# Patient Record
Sex: Male | Born: 1950 | Race: White | Hispanic: No | Marital: Married | Smoking: Never smoker
Health system: Southern US, Community
[De-identification: ages and names within clinical notes are randomized; demographics above are authoritative.]

## PROBLEM LIST (undated history)

## (undated) DIAGNOSIS — M545 Low back pain, unspecified: Secondary | ICD-10-CM

## (undated) DIAGNOSIS — M4126 Other idiopathic scoliosis, lumbar region: Secondary | ICD-10-CM

## (undated) DIAGNOSIS — R9431 Abnormal electrocardiogram [ECG] [EKG]: Secondary | ICD-10-CM

## (undated) DIAGNOSIS — M5416 Radiculopathy, lumbar region: Secondary | ICD-10-CM

## (undated) DIAGNOSIS — M2560 Stiffness of unspecified joint, not elsewhere classified: Secondary | ICD-10-CM

## (undated) DIAGNOSIS — M4316 Spondylolisthesis, lumbar region: Secondary | ICD-10-CM

## (undated) DIAGNOSIS — M7989 Other specified soft tissue disorders: Secondary | ICD-10-CM

## (undated) DIAGNOSIS — Z13828 Encounter for screening for other musculoskeletal disorder: Secondary | ICD-10-CM

## (undated) DIAGNOSIS — M25569 Pain in unspecified knee: Secondary | ICD-10-CM

## (undated) DIAGNOSIS — Z9289 Personal history of other medical treatment: Secondary | ICD-10-CM

## (undated) DIAGNOSIS — M1990 Unspecified osteoarthritis, unspecified site: Secondary | ICD-10-CM

## (undated) DIAGNOSIS — G4730 Sleep apnea, unspecified: Secondary | ICD-10-CM

## (undated) DIAGNOSIS — M48061 Spinal stenosis, lumbar region without neurogenic claudication: Secondary | ICD-10-CM

## (undated) HISTORY — DX: Spinal stenosis, lumbar region without neurogenic claudication: M48.061

## (undated) HISTORY — PX: BACK SURGERY: SHX140

## (undated) HISTORY — PX: COLONOSCOPY W/ POLYPECTOMY: SHX1380

## (undated) HISTORY — DX: Radiculopathy, lumbar region: M54.16

## (undated) HISTORY — DX: Other specified soft tissue disorders: M79.89

## (undated) HISTORY — DX: Stiffness of unspecified joint, not elsewhere classified: M25.60

## (undated) HISTORY — PX: HERNIA REPAIR: SHX51

## (undated) HISTORY — DX: Pain in unspecified knee: M25.569

## (undated) HISTORY — DX: Encounter for screening for other musculoskeletal disorder: Z13.828

## (undated) HISTORY — DX: Low back pain, unspecified: M54.50

## (undated) HISTORY — PX: KNEE ARTHROSCOPY: SHX127

## (undated) HISTORY — DX: Other idiopathic scoliosis, lumbar region: M41.26

## (undated) HISTORY — DX: Spondylolisthesis, lumbar region: M43.16

## (undated) NOTE — *Deleted (*Deleted)
Physical Medicine and Rehabilitation Admission H&P     HPI: Marco Kennedy is a 24 year old right-handed male with history of OSA on BiPAP, left THA 2017.  Per chart review patient lives with spouse.  Two-level home bed and bath main level.  Independent with assistive device.  Presented 04/18/2020 with progressive low back pain radiating to the lower extremities.  X-rays and imaging revealed scoliosis and kyphoscoliosis with radiculopathy L4-5, L5 to S1.  Patient no change with conservative care recommendations of two-stage lateral lumbar fusion.  Patient underwent L4-5 L5 to S1 anterior lumbar interbody fusion with anterior abdominal approach 04/18/2020 per Dr. Venetia Maxon assisted by vascular surgery Dr. Chestine Spore followed by T10 to pelvis pedicle screw fixation with osteotomies 04/21/2020.  Back brace as directed.  Therapy evaluations completed and patient was admitted for a comprehensive rehab program.  Review of Systems  Constitutional: Negative for chills and fever.  HENT: Negative for hearing loss.   Eyes: Negative for blurred vision, double vision and discharge.  Respiratory: Negative for cough and shortness of breath.   Cardiovascular: Negative for chest pain and leg swelling.  Gastrointestinal: Positive for constipation. Negative for heartburn, nausea and vomiting.  Genitourinary: Positive for urgency. Negative for dysuria, flank pain and hematuria.  Musculoskeletal: Positive for back pain, joint pain and myalgias.  Skin: Negative for rash.  All other systems reviewed and are negative.  Past Medical History:  Diagnosis Date  . Arthritis    arthritis-bilateral knees, left Hip.  . Degenerative lumbar spinal stenosis   . EKG abnormality    06-08-16- being evaluated by cardiologist  in [place]  . Idiopathic scoliosis of lumbar region   . Joint stiffness   . Knee pain   . Leg swelling   . Low back pain    Achy, shooting pain.  Hurts to Walk or stand up straight.  . Lumbar  radiculopathy   . Scoliosis concern   . Sleep apnea    Bipap use nightly 25/19 -3l/m oxygen  . Spondylolisthesis of lumbar region   . Transfusion history    [place] ,[place] after bleeding post colon polyp removal and colonscopy procedure   Past Surgical History:  Procedure Laterality Date  . ABDOMINAL EXPOSURE N/A 04/18/2020   Procedure: ABDOMINAL EXPOSURE;  Surgeon: [name], MD;  Location: [facility] OR;  Service: Vascular;  Laterality: N/A;  . ANTERIOR LAT LUMBAR FUSION Left 04/18/2020   Procedure: ANTERIOR LATERAL LUMBAR FUSION LUMBAR TWO-THREE, LUMBAR THREE-FOUR;  Surgeon: [name], MD;  Location: [facility] OR;  Service: Neurosurgery;  Laterality: Left;  . ANTERIOR LUMBAR FUSION N/A 04/18/2020   Procedure: Lumbar Four-Five Lumbar Five- Sacral One Anterior lumbar Interbody Fusion;  Surgeon: [name], MD;  Location: [facility] OR;  Service: Neurosurgery;  Laterality: N/A;  . APPLICATION OF INTRAOPERATIVE CT SCAN N/A 04/21/2020   Procedure: APPLICATION OF INTRAOPERATIVE CT SCAN;  Surgeon: [name], MD;  Location: [facility] OR;  Service: Neurosurgery;  Laterality: N/A;  . BACK SURGERY     lumbar disc surgery  . COLONOSCOPY W/ POLYPECTOMY    . HERNIA REPAIR Left    left inguinal hernia repair  . KNEE ARTHROSCOPY Right 2003    -scope for meniscus  . POSTERIOR LUMBAR FUSION 4 LEVEL N/A 04/21/2020   Procedure: Thoracic ten to pelvis pedicle screw fixation with osteotomies;  Surgeon: [name], MD;  Location: [facility] OR;  Service: Neurosurgery;  Laterality: N/A;  . TOTAL HIP ARTHROPLASTY Left 06/15/2016   Procedure: LEFT TOTAL HIP ARTHROPLASTY ANTERIOR APPROACH;  Surgeon: [name], MD;  Location: [facility] ORS;  Service: Orthopedics;  Laterality: Left;   Family History  Problem Relation Age of Onset  . Hypertension Mother   . Arthritis Mother   . Stomach cancer Father   . Hypertension Brother    Social History:  reports that he has never smoked. He has never used smokeless tobacco. He reports  current alcohol use. He reports that he does not use drugs. Allergies: No Known Allergies Medications Prior to Admission  Medication Sig Dispense Refill  . acetaminophen (TYLENOL) 500 MG tablet Take 1,000 mg by mouth every 6 (six) hours as needed for moderate pain.     . Ascorbic Acid (VITAMIN C) 100 MG tablet Take 100 mg by mouth daily.    . cholecalciferol (VITAMIN D3) 25 MCG (1000 UNIT) tablet Take 1,000 Units by mouth daily.    . diclofenac (VOLTAREN) 75 MG EC tablet Take 75 mg by mouth 2 (two) times daily as needed.    . Multiple Vitamin (MULTIVITAMIN WITH MINERALS) TABS tablet Take 1 tablet by mouth daily.    Marland Kitchen docusate sodium (COLACE) 100 MG capsule Take 1 capsule (100 mg total) by mouth 2 (two) times daily. (Patient not taking: Reported on 04/03/2020) 10 capsule 0  . ferrous sulfate 325 (65 FE) MG tablet Take 1 tablet (325 mg total) by mouth 3 (three) times daily after meals. (Patient not taking: Reported on 04/03/2020)  3  . Glucosamine-Chondroitin (OSTEO BI-FLEX REGULAR STRENGTH PO) Take 2 capsules by mouth daily. (Patient not taking: Reported on 12/25/2019)    . HYDROcodone-acetaminophen (NORCO) 7.5-325 MG tablet Take 1-2 tablets by mouth every 4 (four) hours as needed for moderate pain. (Patient not taking: Reported on 12/25/2019) 60 tablet 0  . methocarbamol (ROBAXIN) 500 MG tablet Take 1 tablet (500 mg total) by mouth every 6 (six) hours as needed for muscle spasms. (Patient not taking: Reported on 12/25/2019) 40 tablet 0  . polyethylene glycol (MIRALAX / GLYCOLAX) packet Take 17 g by mouth 2 (two) times daily. (Patient not taking: Reported on 12/25/2019) 14 each 0    Drug Regimen Review Drug regimen was reviewed and remains appropriate with no significant issues identified  Home: Home Living Family/patient expects to be discharged to:: Private residence Living Arrangements: Spouse/significant other Available Help at Discharge: Family, Available PRN/intermittently (daughter who is PT  for 4 days following d/c) Type of Home: House Home Access: Stairs to enter Home Layout: Two level, Able to live on main level with bedroom/bathroom Alternate Level Stairs-Number of Steps: flight Bathroom Shower/Tub: Engineer, manufacturing systems: Standard Home Equipment: Cane - single point, Environmental consultant - 2 wheels, Bedside commode Additional Comments: recliner pt could sleep in if needed   Functional History: Prior Function Level of Independence: Independent with assistive device(s) Comments: pt reports use of RW or cane as needed, limited by pain   Functional Status:  Mobility: Bed Mobility Overal bed mobility: Needs Assistance Bed Mobility: Rolling, Sidelying to Sit Rolling: Mod assist Sidelying to sit: Mod assist, Max assist, +2 for safety/equipment Sit to sidelying: Mod assist General bed mobility comments: cued for technique, truncal assist for roll right and significant truncal assist up from R elbow Transfers Overall transfer level: Needs assistance Equipment used: Rolling walker (2 wheeled) Transfers: Sit to/from Stand, Stand Pivot Transfers Sit to Stand: Mod assist, +2 physical assistance, Max assist Stand pivot transfers: Mod assist, +2 physical assistance General transfer comment: cues for hand placement, assist forward and boost.  Ambulation/Gait Ambulation/Gait assistance: Min assist, +  2 safety/equipment Gait Distance (Feet): 18 Feet Assistive device: Rolling walker (2 wheeled) Gait Pattern/deviations: Step-to pattern, Step-through pattern, Decreased step length - right, Decreased step length - left, Decreased stance time - left, Decreased stride length General Gait Details: cues for posture correction, short low amplitude steps with stability assist Gait velocity: decreased Gait velocity interpretation: <1.31 ft/sec, indicative of household ambulator    ADL: ADL Overall ADL's : Needs assistance/impaired Eating/Feeding: Modified independent, Sitting Grooming:  Wash/dry face, Set up, Sitting Grooming Details (indicate cue type and reason): supported sitting  Upper Body Bathing: Min guard, Sitting Lower Body Bathing: Maximal assistance, +2 for physical assistance, Sit to/from stand Upper Body Dressing : Moderate assistance, Sitting Upper Body Dressing Details (indicate cue type and reason): for TLSO management Lower Body Dressing: Total assistance, +2 for physical assistance, Sit to/from stand Lower Body Dressing Details (indicate cue type and reason): socks Toilet Transfer: Moderate assistance, +2 for safety/equipment, +2 for physical assistance, Stand-pivot Toilet Transfer Details (indicate cue type and reason): simulated via transfer to recliner  Toileting- Clothing Manipulation and Hygiene: Maximal assistance, +2 for physical assistance, +2 for safety/equipment, Sit to/from stand Functional mobility during ADLs: Moderate assistance, +2 for physical assistance, +2 for safety/equipment, Rolling walker General ADL Comments: pt with limitations due to pain, LLE weakness   Cognition: Cognition Overall Cognitive Status: Within Functional Limits for tasks assessed Orientation Level: Oriented X4 Cognition Arousal/Alertness: Awake/alert Behavior During Therapy: WFL for tasks assessed/performed Overall Cognitive Status: Within Functional Limits for tasks assessed  Physical Exam: Blood pressure (!) 143/75, pulse 67, temperature 98.6 F (37 C), temperature source Axillary, resp. rate 18, height 5\' 7"  (1.702 m), weight 108 kg, SpO2 98 %. Physical Exam Skin:    Comments: Surgical sites are dressed.  Appropriately tender  Neurological:     Comments: Patient is alert in no acute distress.  Oriented x3 and follows commands.     No results found for this or any previous visit (from the past 48 hour(s)). No results found.     Medical Problem List and Plan: 1.  Decreased functional mobility secondary to scoliosis and kyphoscoliosis with lumbar  status post 2 part L4-5 L5-S1 lumbar interbody fusion 04/18/2024 with anterior approach followed by thoracic T10 to pelvis pedicle screw fixation 04/21/2020.  Back brace when out of bed stenosis/radiculopathy.  -patient may *** shower  -ELOS/Goals: *** 2.  Antithrombotics: -DVT/anticoagulation: SCDs.  Check vascular study  -antiplatelet therapy: N/A 3. Pain Management: Robaxin and hydrocodone as needed 4. Mood: Provide emotional support  -antipsychotic agents: N/A 5. Neuropsych: This patient is capable of making decisions on his own behalf. 6. Skin/Wound Care: Routine skin checks 7. Fluids/Electrolytes/Nutrition: Routine in and outs with follow-up chemistries 8.  Constipation.  Colace 100 mg twice daily.  MiraLAX daily as needed, Dulcolax suppository daily as needed    ***  Charlton Amor, PA-C 04/24/2020

## (undated) NOTE — *Deleted (*Deleted)
   04/20/20 1020  Pain Assessment  Pain Scale 0-10

---

## 2007-11-13 ENCOUNTER — Encounter (INDEPENDENT_AMBULATORY_CARE_PROVIDER_SITE_OTHER): Payer: Self-pay | Admitting: Specialist

## 2007-11-13 ENCOUNTER — Ambulatory Visit (HOSPITAL_COMMUNITY): Admission: RE | Admit: 2007-11-13 | Discharge: 2007-11-14 | Payer: Self-pay | Admitting: Specialist

## 2007-11-13 IMAGING — CR DG LUMBAR SPINE 2-3V
2 series · 2 of 2 positions shown · non-contrast
Comparison: None is filled

CLINICAL DATA: Preop workup for laminectomy at L4-5.  HNP at L4-5

LUMBAR SPINE - 2-3 VIEW

[t l-spine a.p.]
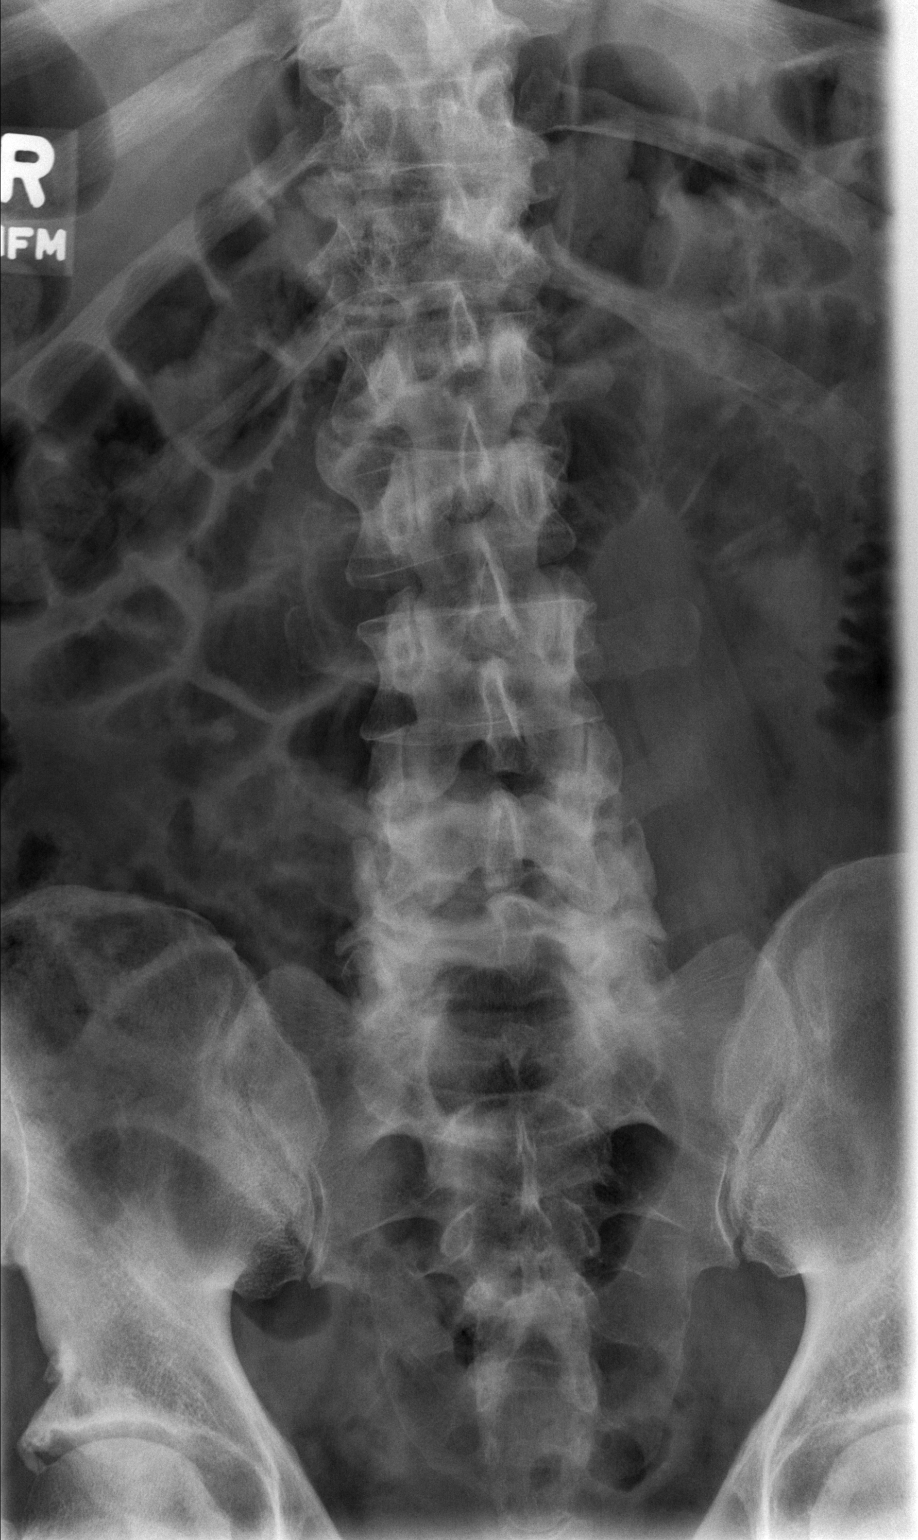

[t l-spine lat]
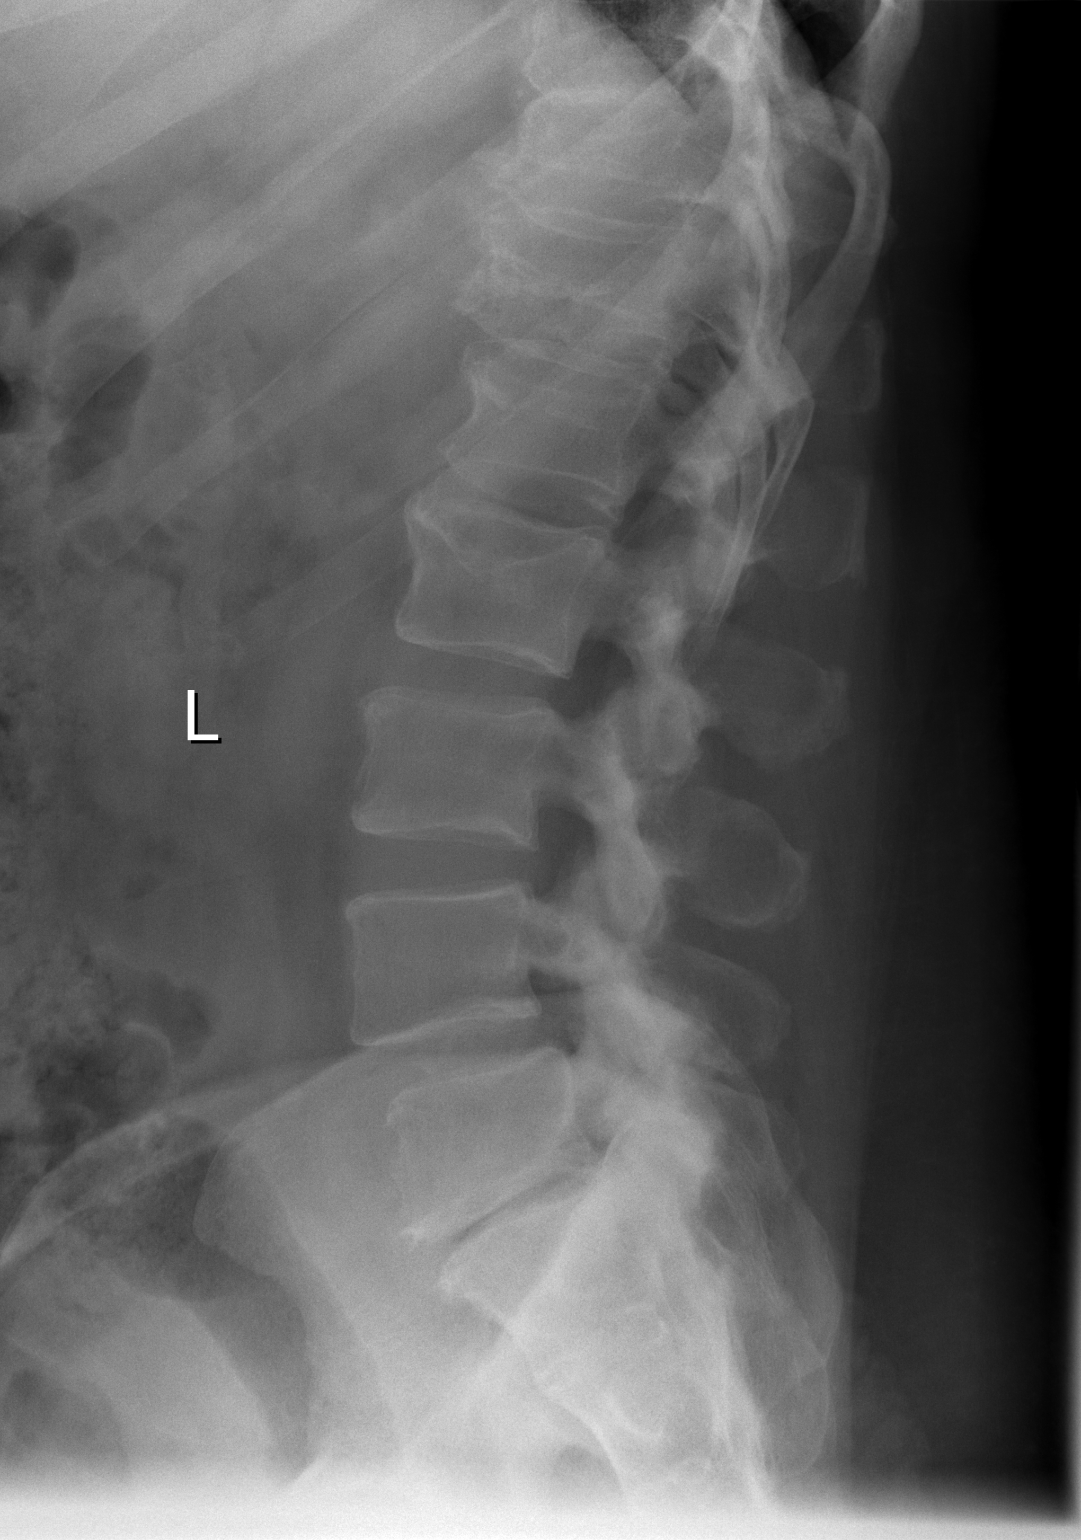

[2 of 2 positions shown; findings below may reference images not displayed]

FINDINGS: The patient has five non-rib-bearing lumbar vertebrae.
Lowest interspace is L5-S1.  Minimal anterolisthesis of L4-L5 by 3
mm secondary to degenerative facet arthropathy.  Marked disc space
narrowing and vacuum disc at L5-S1.  Findings compatible with
previous superior L2 endplate compression.  There may be endplate
compressions at T11, T12 and L1 as well.  Degenerative changes at
T11-12, T12-L1 and L1-2.  SI joints unremarkable.  Mild gaseous
distension of a few small bowel loops may represent an ileus.
IMPRESSION: The advanced DDD L5-S1.  Minimal closed arch anterolisthesis of L4
on L5..  Degenerative changes also are noted involving the lower
thoracic and upper lumbar spine.  Probable remote endplate
compressions of T11 to L2.

## 2007-11-13 IMAGING — CR DG OR PORTABLE SPINE
1 series · 1 of 1 positions shown · non-contrast
Comparison: [DATE]

CLINICAL DATA: L4-5 herniated disc

PORTABLE SPINE

[view not recorded]
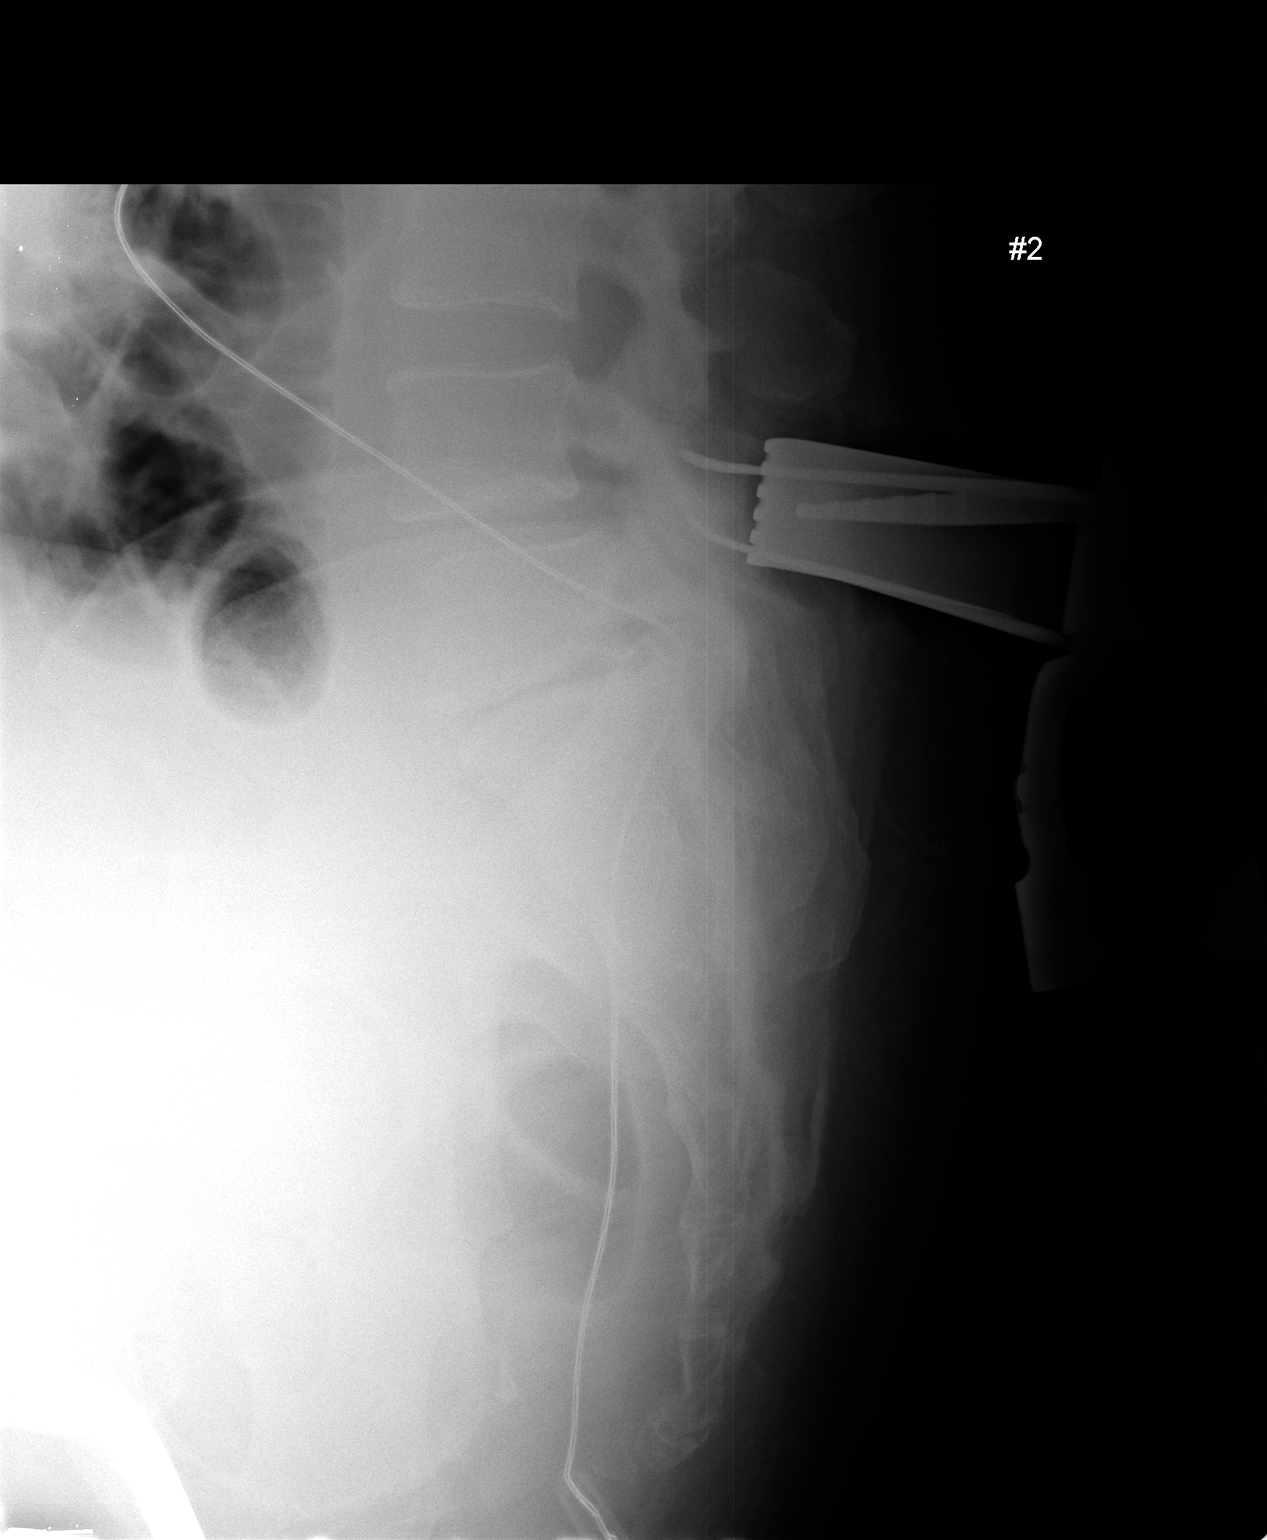

[1 of 1 positions shown; findings below may reference images not displayed]

FINDINGS: Intraoperative lateral lumbar spine view at [6P] hours
demonstrates radiopaque localizers and a surgical retractor
posterior to L4-5.
IMPRESSION: Intraoperative localization L4-5

## 2007-11-13 IMAGING — CR DG CHEST 2V
2 series · 2 of 2 positions shown · non-contrast
Comparison: None

CLINICAL DATA: Preop workup for lumbar surgery.

CHEST - 2 VIEW

[w chest pa *]
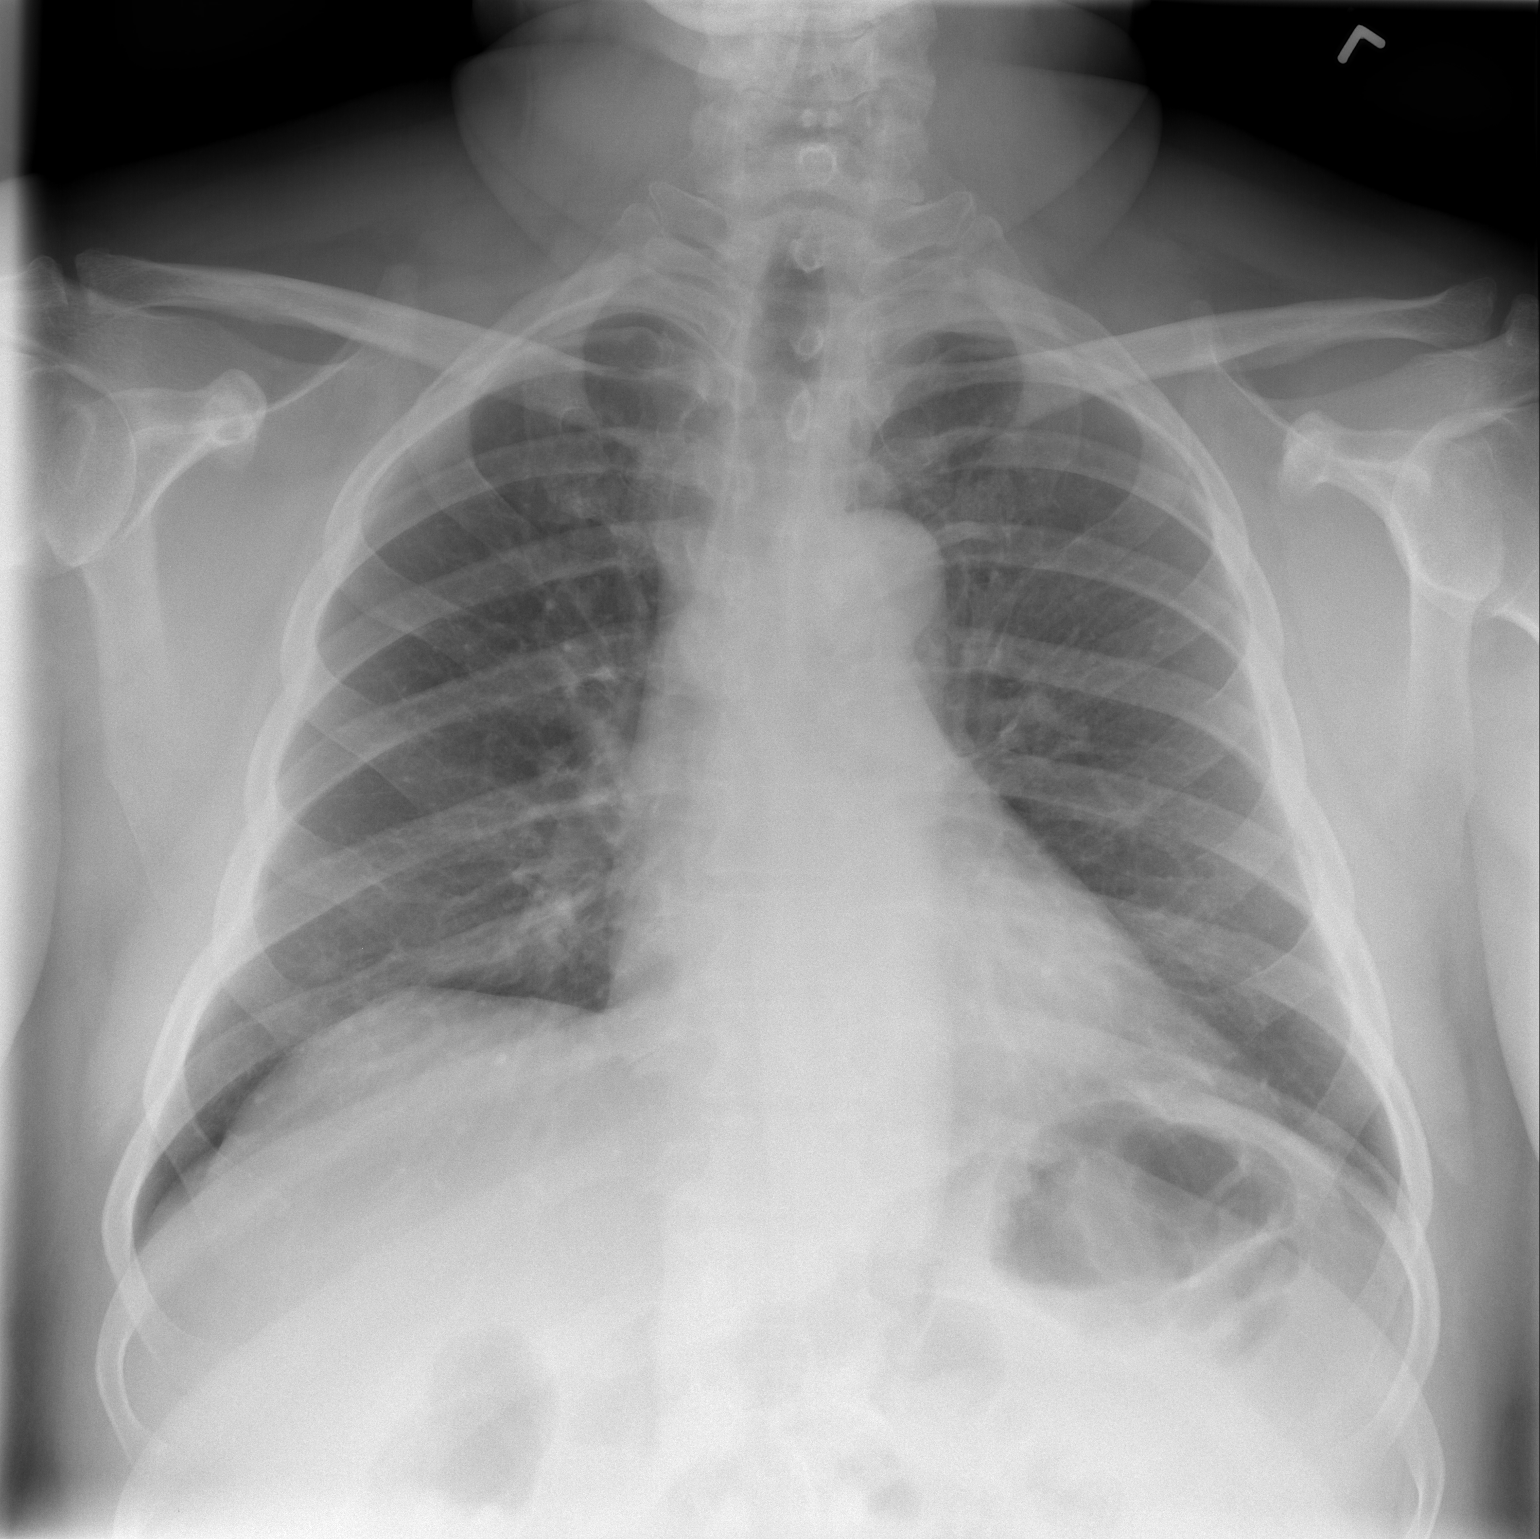

[w chest lat *]
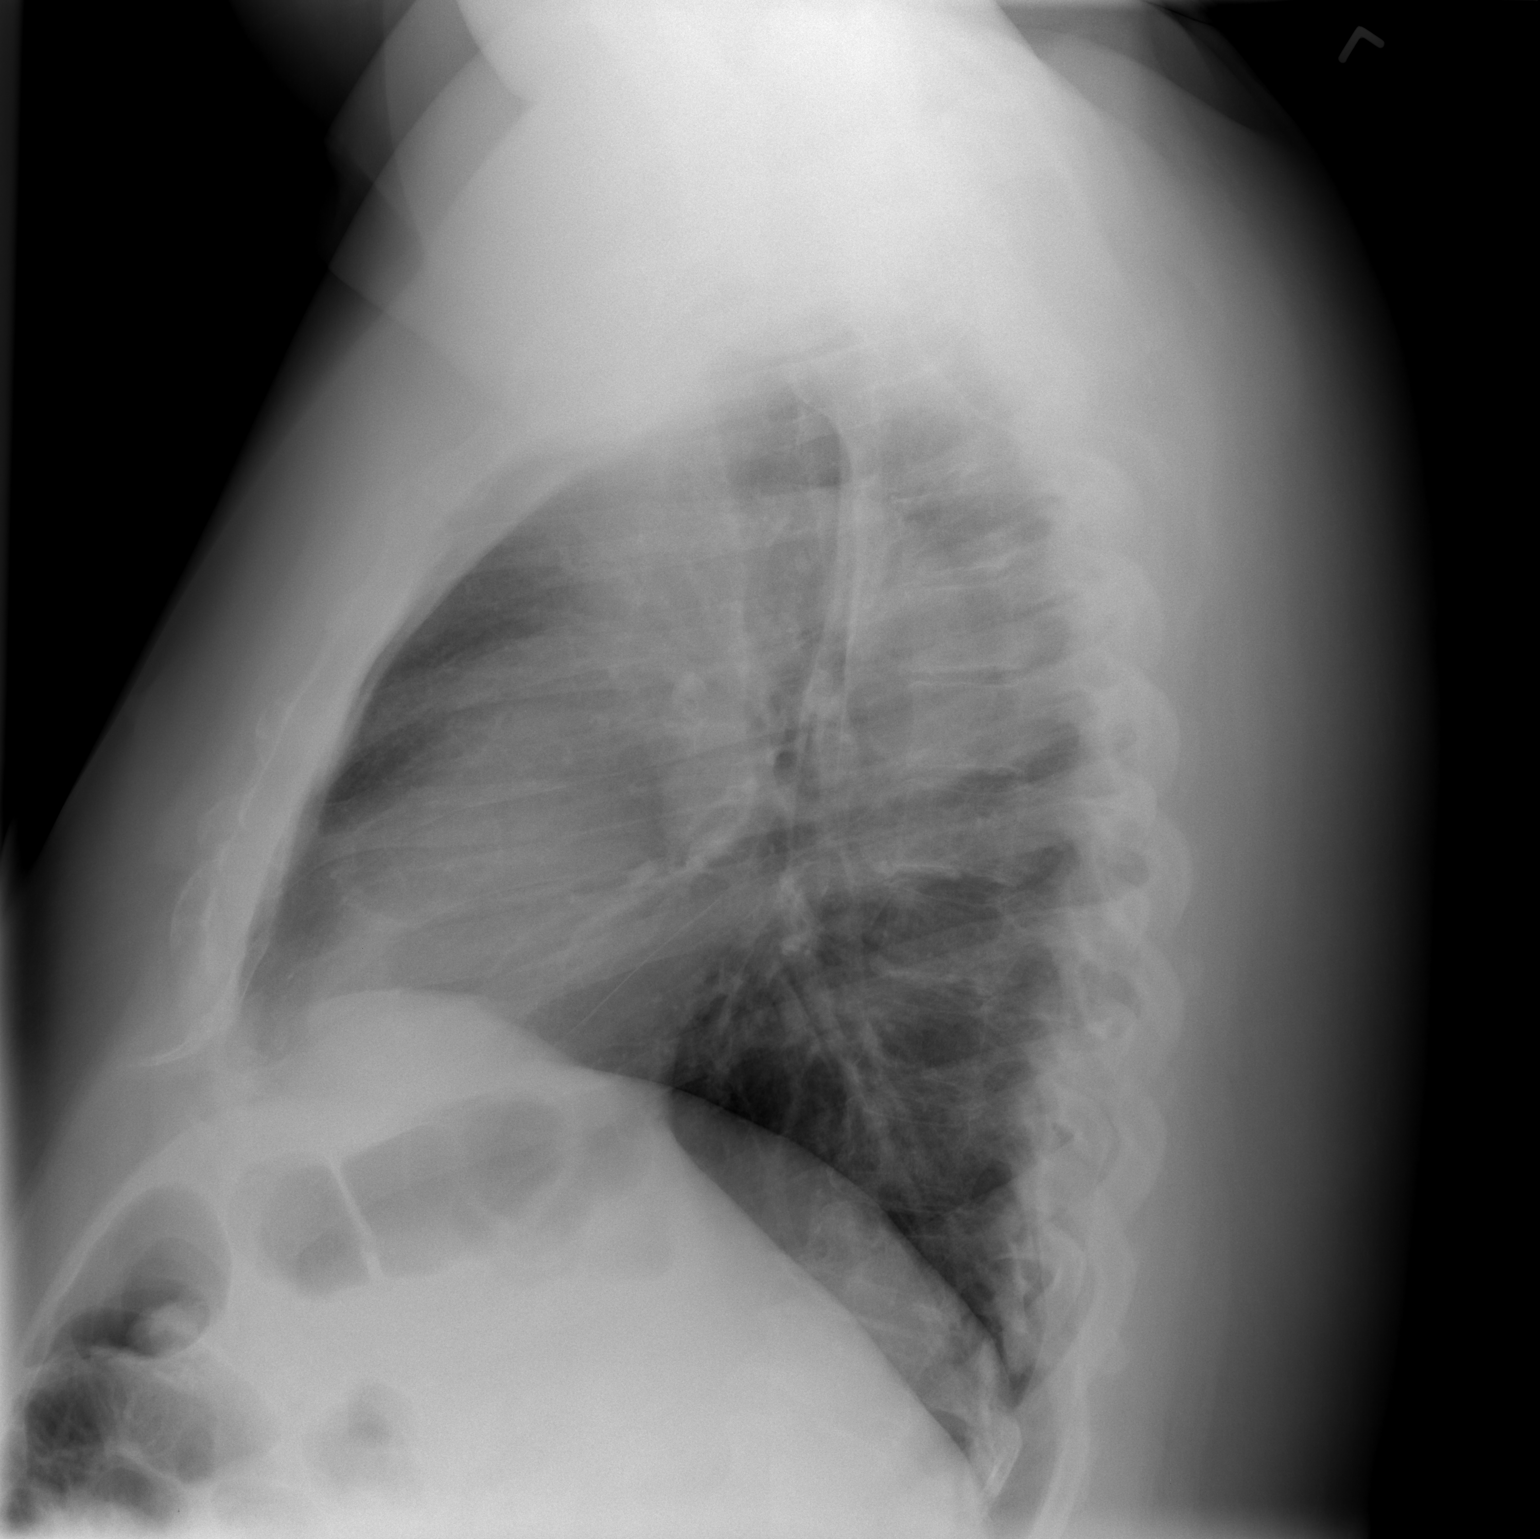

[2 of 2 positions shown; findings below may reference images not displayed]

FINDINGS: The heart size and mediastinal contours are within normal
limits.  Both lungs are clear.  The visualized skeletal structures
are unremarkable.
IMPRESSION: No active cardiopulmonary disease.

## 2007-11-13 IMAGING — CR DG OR PORTABLE SPINE
1 series · 1 of 1 positions shown · non-contrast
Comparison: [DATE] lumbar spine

CLINICAL DATA: Back pain

PORTABLE SPINE

[view not recorded]
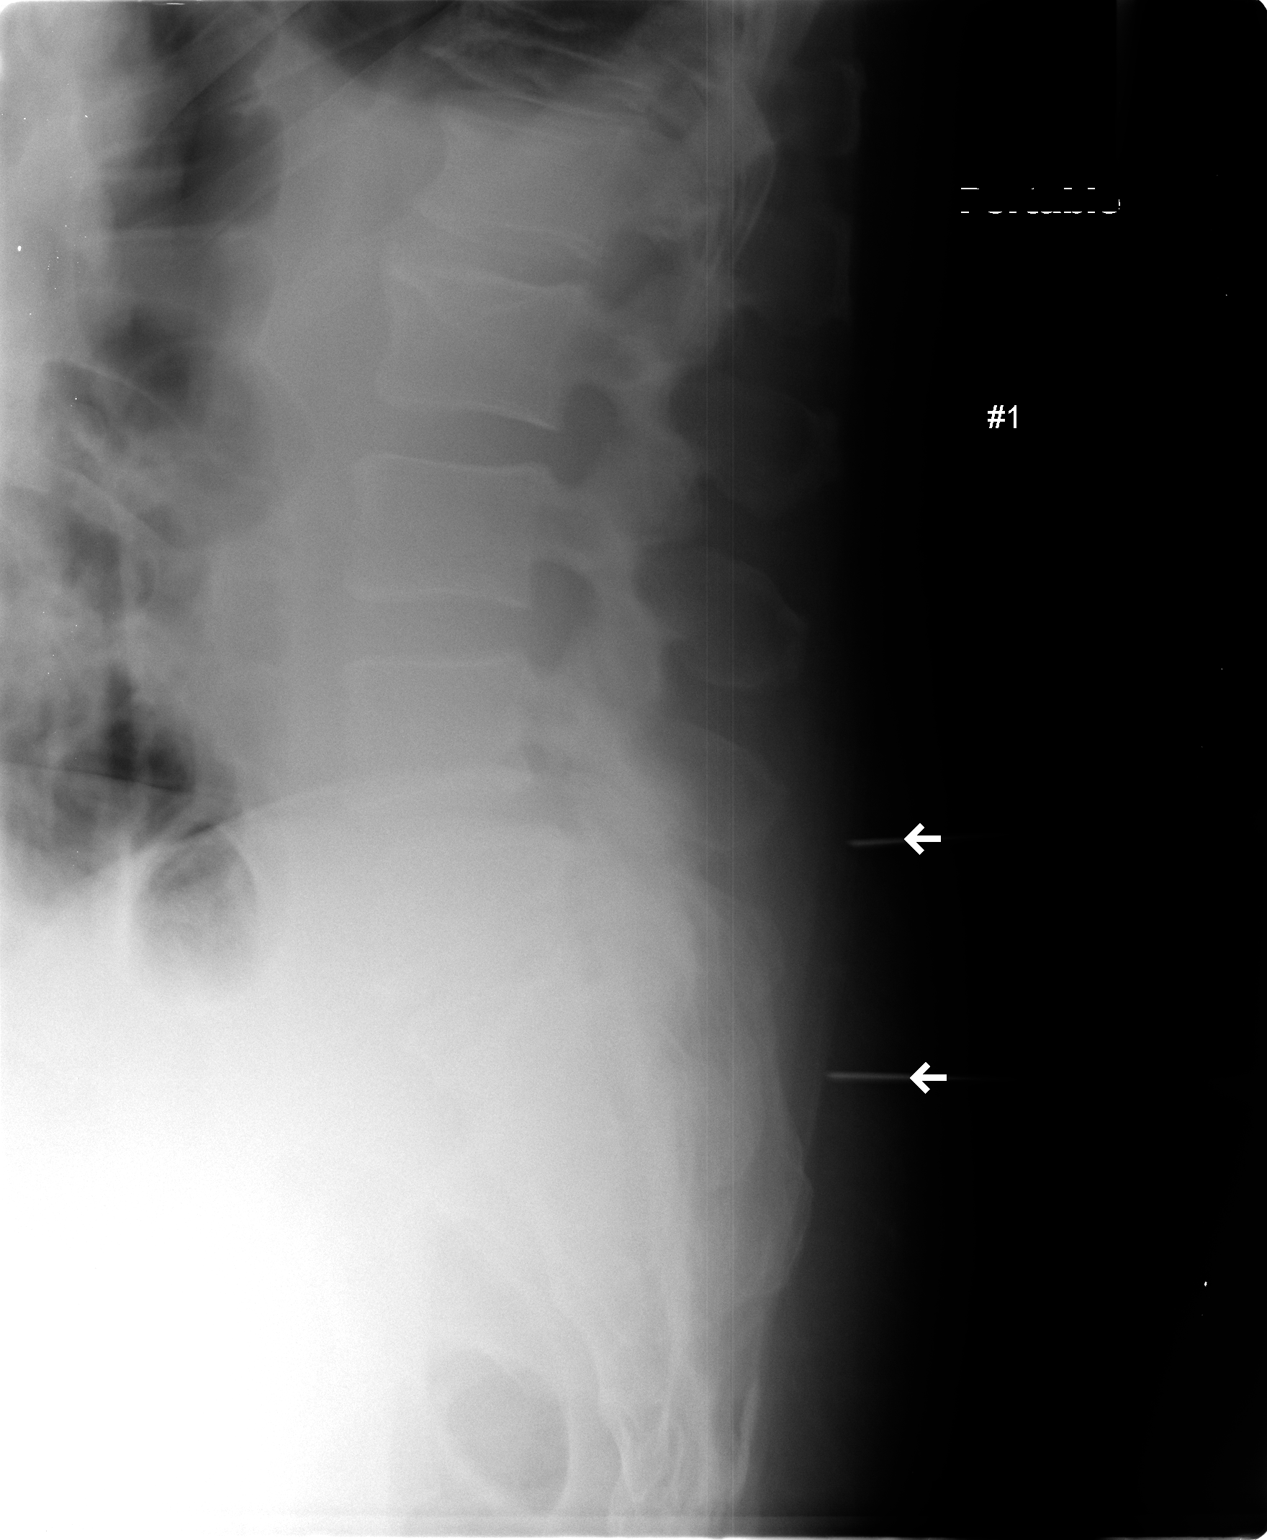

[1 of 1 positions shown; findings below may reference images not displayed]

FINDINGS: Intraoperative lateral film at [RC] hours demonstrates an
upper needle projecting just dorsal to the L4 spinous process.  A
lower needle seen in the back does not project directly over a
specific landmark.
IMPRESSION: As above

## 2010-11-10 NOTE — Op Note (Signed)
NAMEZURICH, CARRENO              ACCOUNT NO.:  [id number]   MEDICAL RECORD NO.:  [id number]          PATIENT TYPE:  AMB   LOCATION:  DAY                          FACILITY:  [facility]   PHYSICIAN:  Jene Every, M.D.    DATE OF BIRTH:  1950-09-13   DATE OF PROCEDURE:  11/13/2007  DATE OF DISCHARGE:                               OPERATIVE REPORT   PREOPERATIVE DIAGNOSIS:  Spinal stenosis, herniated nucleus pulposus L3-  4, L4-5 left.   POSTOPERATIVE DIAGNOSIS:  Spinal stenosis, herniated nucleus pulposus L3-  4, L4-5 left.   PROCEDURE PERFORMED:  Decompression L3-4, L4-5; hemilaminectomy of L4;  foraminotomy of L5; microdiskectomy 4-5.   ANESTHESIA:  General.   ASSISTANT:  Strader.   BRIEF HISTORY AND INDICATIONS:  A 60 year old with large disk herniation  migrating cephalad, compression of 3 and 4 roots, quad weakness,  diminished knee reflex, was indicated for decompression.  Three and 4  root had migrated cephalad.  Therefore required most probably a  hemilaminectomy extending to the level of L3-4.  Risks and benefits  discussed including bleeding, infection, damage to neurovascular  structures, CSF leakage, epidural fibrosis, adjacent segment disease,  need fusion in the future, anesthetic complications, etc.   TECHNIQUE:  The patient in supine position.  After induction of adequate  anesthesia and 2 g of Kefzol, he was placed prone on the Energy frame.  All bony prominences were well-padded.  Lumbar region prepped and draped  in the usual sterile fashion.  Two 18-gauge spinal needles utilized to  localize the 4-5 interspace and confirmed with x-ray.  Incision was made  from the spinous process of 3 to 5.  Subcutaneous tissue was dissected.  Electrocautery was utilized to achieve hemostasis.  Dorsolumbar fascia  identified and divided in line with the skin incision.  Paraspinous  muscle elevated from the lamina 4 and 5.  McCullough retractor was  placed.  A Penfield 4 in  the interlaminar space confirmed by x-ray.  Operating microscope draped and brought in the surgical field.  A high-  speed bur was utilized to remove part of the lamina of 4.  The 2-mm  Kerrison was then utilized to detach ligamentum flavum from the cephalad  edge of 4.  We extended that cephalad to detach the ligamentum flavum.  The hemilamina was removed and ligamentum flavum removed from the  interspace to gain cephalad access to the disk herniation.  We placed a  neural patty beneath the ligamentum flavum, removed the ligamentum  flavum of 4-5 to the superior edge of 5.  Noted was a hypervascular  venous plexus on the dorsal side of thecal sac.  Gently mobilized the  thecal sac medially.  This was after we decompressed the lateral recess  of the medial border of the pedicle.  I found a large fragment that  migrated cephalad.  This was mobilized with a nerve hook and a micro  pituitary, and four large fragments were excised from posterior to the  vertebra of 4.  The vertebrae was shingled due to the slight  spondylolisthesis.  Following the fragments that had migrated cephalad,  we found the disk space at 4-5, incised it, and removed the disk  material from the disk space along the posterior longitudinal ligament.  Bipolar electrocautery was utilized to achieve hemostasis.  Following  the disk herniation removal, we evaluated the fragments, and they  appeared to be consistent with that seen on the MRI.  Hockey-stick probe  placed freely out the foramen of 3, 4, and 5.  This was following  decompression.  Wound copiously irrigated.  Thrombin-soaked Gelfoam was  placed in laminotomy defects.  Inspection revealed no evidence of CSF  leakage or active bleeding.   Next, the dorsal fascia was reapproximated with #1 Vicryl with  interrupted figure-of-eight sutures.  Subcutaneous tissue reapproximated  with 2-0 Vicryl.  Skin reapproximated with staples.  Wound was dressed  sterilely.   Awakened without difficulty and transported to the recovery  range of motion in satisfactory condition.   The patient tolerated the procedure well with no complication.  Minimal  blood loss.   ASSISTANT:  Roma Schanz.      Jene Every, M.D.  Electronically Signed     JB/MEDQ  D:  11/13/2007  T:  11/13/2007  Job:  [id number]

## 2011-03-24 LAB — CBC
Hemoglobin: 16.7
RBC: 5.58
WBC: 8.6

## 2011-03-24 LAB — URINALYSIS, ROUTINE W REFLEX MICROSCOPIC
Nitrite: NEGATIVE
Specific Gravity, Urine: 1.021
pH: 6

## 2011-03-24 LAB — COMPREHENSIVE METABOLIC PANEL
ALT: 44
Alkaline Phosphatase: 45
CO2: 29
Chloride: 102
GFR calc non Af Amer: >60
Glucose, Bld: 99
Potassium: 4.1
Sodium: 139
Total Protein: 6.7

## 2011-03-24 LAB — PROTIME-INR: INR: 1

## 2011-03-24 LAB — DIFFERENTIAL
Basophils Relative: 0
Eosinophils Absolute: 0.2
Monocytes Relative: 6
Neutrophils Relative %: 73

## 2016-06-03 NOTE — H&P (Signed)
TOTAL HIP ADMISSION H&P  Patient is admitted for left total hip arthroplasty, anterior approach.  Subjective:  Chief Complaint:     Left hip primary OA / pain  HPI: Marco Kennedy, 65 y.o. male, has a history of pain and functional disability in the left hip(s) due to arthritis and patient has failed non-surgical conservative treatments for greater than 12 weeks to include NSAID's and/or analgesics and activity modification.  Onset of symptoms was gradual starting 2+ years ago with gradually worsening course since that time.The patient noted no past surgery on the left hip(s).  Patient currently rates pain in the left hip at 8 out of 10 with activity. Patient has night pain, worsening of pain with activity and weight bearing, trendelenberg gait, pain that interfers with activities of daily living and pain with passive range of motion. Patient has evidence of periarticular osteophytes and joint space narrowing by imaging studies. This condition presents safety issues increasing the risk of falls.  There is no current active infection.  Risks, benefits and expectations were discussed with the patient.  Risks including but not limited to the risk of anesthesia, blood clots, nerve damage, blood vessel damage, failure of the prosthesis, infection and up to and including death.  Patient understand the risks, benefits and expectations and wishes to proceed with surgery.   PCP: [name],[name], MD  D/C Plans:      Home  Post-op Meds:       No Rx given  Tranexamic Acid:      To be given - IV  Decadron:      Is to be given  FYI:     ASA  Norco  CPAP     Past Medical History:  Diagnosis Date  . Arthritis    arthritis-bilateral knees, left Hip.  . EKG abnormality    06-08-16- being evaluated by cardiologist  in [place]  . Sleep apnea    Bipap use nightly 25/19 -3l/m oxygen  . [place]    [place] post colon polyp removal and colonscopy procedure     Past Surgical History:  Procedure Laterality Date  . BACK SURGERY     lumbar disc surgery  . COLONOSCOPY W/ POLYPECTOMY    . HERNIA REPAIR Left    left inguinal hernia repair  . KNEE ARTHROSCOPY Right    x3 -scope for meniscus    No prescriptions prior to admission.   No Known Allergies   Social History  Substance Use Topics  . Smoking status: Never Smoker  . Smokeless tobacco: Never Used  . Alcohol use Yes     Comment: rare- social        Review of Systems  Constitutional: Negative.   HENT: Negative.   Eyes: Negative.   Respiratory: Negative.   Cardiovascular: Negative.   Gastrointestinal: Negative.   Genitourinary: Negative.   Musculoskeletal: Positive for joint pain.  Skin: Negative.   Neurological: Negative.   Endo/Heme/Allergies: Negative.   Psychiatric/Behavioral: Negative.     Objective:  Physical Exam  Constitutional: He is oriented to person, place, and time. He appears well-developed.  HENT:  Head: Normocephalic.  Eyes: Pupils are equal, round, and reactive to light.  Neck: Neck supple. No JVD present. No tracheal deviation present. No thyromegaly present.  Cardiovascular: Normal rate, regular rhythm, normal heart sounds and intact distal pulses.   Respiratory: Effort normal and breath sounds normal. No respiratory distress. He has no wheezes.  GI: Soft. There is no tenderness. There is  no guarding.  Musculoskeletal:       Left hip: He exhibits decreased range of motion, decreased strength, tenderness and bony tenderness. He exhibits no swelling, no deformity and no laceration.  Lymphadenopathy:    He has no cervical adenopathy.  Neurological: He is alert and oriented to person, place, and time.  Skin: Skin is warm and dry.  Psychiatric: He has a normal mood and affect.      Imaging Review Plain radiographs demonstrate severe degenerative joint disease of the left hip(s). The bone quality appears to be good for age and reported activity  level.  Assessment/Plan:  End stage arthritis, left hip(s)  The patient history, physical examination, clinical judgement of the provider and imaging studies are consistent with end stage degenerative joint disease of the left hip(s) and total hip arthroplasty is deemed medically necessary. The treatment options including medical management, injection therapy, arthroscopy and arthroplasty were discussed at length. The risks and benefits of total hip arthroplasty were presented and reviewed. The risks due to aseptic loosening, infection, stiffness, dislocation/subluxation,  thromboembolic complications and other imponderables were discussed.  The patient acknowledged the explanation, agreed to proceed with the plan and consent was signed. Patient is being admitted for inpatient treatment for surgery, pain control, PT, OT, prophylactic antibiotics, VTE prophylaxis, progressive ambulation and ADL's and discharge planning.The patient is planning to be discharged home.     Anastasio AuerbachMatthew S. Desteny Freeman   PA-C  06/11/2016, 7:42 AM

## 2016-06-07 NOTE — Progress Notes (Signed)
05/17/2016- Lab s- CBC w/diff., CMP, HGA1C and LOV from Dr. Hillis RangeJohn Hunarland on chart.  03/03/2016- EKG from Dr. Norval GableHungarland on chart. 04/23/2016- Pre-operative clearance from Dr. Lucretia FieldGrossman,  Pulmonologist on chart.

## 2016-06-07 NOTE — Patient Instructions (Addendum)
Marco Kennedy  06/07/2016   Your procedure is scheduled on: Tuesday 06/15/2016  [facility]  [facility] 3rd floor to  Short Stay Center at   1100 AM.  Call this number if you have problems the morning of surgery [phone]   Remember: ONLY 1 PERSON MAY GO WITH YOU TO SHORT STAY TO GET  READY MORNING OF YOUR SURGERY.    Do not eat food  :After Midnight.MAY HAVE CLEAR CLEAR LIQUIDS FROM MIDNIGHT UP UNTIL 0800 THEN NOTHING UNTIL AFTER SURGERY!       CLEAR LIQUID DIET   Foods Allowed                                                                     Foods Excluded  Coffee and tea, regular and decaf                             liquids that you cannot  Plain Jell-O in any flavor                                             see through such as: Fruit ices (not with fruit pulp)                                     milk, soups, orange juice  Iced Popsicles                                    All solid food Carbonated beverages, regular and diet                                    Cranberry, grape and apple juices Sports drinks like Gatorade Lightly seasoned clear broth or consume(fat free) Sugar, honey syrup   _____________________________________________________________________     Take these medicines the morning of surgery with A SIP OF WATER: Tylenol,Tramadol if needed                                 You may not have any metal on your body including hair pins and              piercings  Do not wear jewelry, make-up, lotions, powders or perfumes, deodorant             Do not wear nail polish.  Do not shave  48 hours prior to surgery.              Men may shave face and neck.   Do not bring valuables to the hospital. [place] IS NOT             RESPONSIBLE  FOR VALUABLES.  Contacts, dentures or bridgework may not be worn into surgery.  Leave suitcase in the car. After surgery it may be brought to your  room.                  Please read over the following fact sheets you were given: _____________________________________________________________________             [facility] - Preparing for Surgery Before surgery, you can play an important role.  Because skin is not sterile, your skin needs to be as free of germs as possible.  You can reduce the number of germs on your skin by washing with CHG (chlorahexidine gluconate) soap before surgery.  CHG is an antiseptic cleaner which kills germs and bonds with the skin to continue killing germs even after washing. Please DO NOT use if you have an allergy to CHG or antibacterial soaps.  If your skin becomes reddened/irritated stop using the CHG and inform your nurse when you arrive at Short Stay. Do not shave (including legs and underarms) for at least 48 hours prior to the first CHG shower.  You may shave your face/neck. Please follow these instructions carefully:  1.  Shower with CHG Soap the night before surgery and the  morning of Surgery.  2.  If you choose to wash your hair, wash your hair first as usual with your  normal  shampoo.  3.  After you shampoo, rinse your hair and body thoroughly to remove the  shampoo.                           4.  Use CHG as you would any other liquid soap.  You can apply chg directly  to the skin and wash                       Gently with a scrungie or clean washcloth.  5.  Apply the CHG Soap to your body ONLY FROM THE NECK DOWN.   Do not use on face/ open                           Wound or open sores. Avoid contact with eyes, ears mouth and genitals (private parts).                       Wash face,  Genitals (private parts) with your normal soap.             6.  Wash thoroughly, paying special attention to the area where your surgery  will be performed.  7.  Thoroughly rinse your body with warm water from the neck down.  8.  DO NOT shower/wash with your normal soap after using and rinsing off  the CHG Soap.                 9.  Pat yourself dry with a clean towel.            10.  Wear clean pajamas.            11.  Place clean sheets on your bed the night of your first shower and do not  sleep with pets. Day of Surgery : Do not apply any lotions/deodorants the morning of surgery.  Please wear clean clothes to the hospital/surgery center.  FAILURE TO FOLLOW THESE INSTRUCTIONS MAY RESULT IN THE  CANCELLATION OF YOUR SURGERY PATIENT SIGNATURE_________________________________  NURSE SIGNATURE__________________________________  ________________________________________________________________________   Marco MireIncentive Spirometer  An incentive spirometer is a tool that can help keep your lungs clear and active. This tool measures how well you are filling your lungs with each breath. Taking long deep breaths may help reverse or decrease the chance of developing breathing (pulmonary) problems (especially infection) following:  A long period of time when you are unable to move or be active. BEFORE THE PROCEDURE   If the spirometer includes an indicator to show your best effort, your nurse or respiratory therapist will set it to a desired goal.  If possible, sit up straight or lean slightly forward. Try not to slouch.  Hold the incentive spirometer in an upright position. INSTRUCTIONS FOR USE  1. Sit on the edge of your bed if possible, or sit up as far as you can in bed or on a chair. 2. Hold the incentive spirometer in an upright position. 3. Breathe out normally. 4. Place the mouthpiece in your mouth and seal your lips tightly around it. 5. Breathe in slowly and as deeply as possible, raising the piston or the ball toward the top of the column. 6. Hold your breath for 3-5 seconds or for as long as possible. Allow the piston or ball to fall to the bottom of the column. 7. Remove the mouthpiece from your mouth and breathe out normally. 8. Rest for a few seconds and repeat Steps 1 through 7 at least 10 times  every 1-2 hours when you are awake. Take your time and take a few normal breaths between deep breaths. 9. The spirometer may include an indicator to show your best effort. Use the indicator as a goal to work toward during each repetition. 10. After each set of 10 deep breaths, practice coughing to be sure your lungs are clear. If you have an incision (the cut made at the time of surgery), support your incision when coughing by placing a pillow or rolled up towels firmly against it. Once you are able to get out of bed, walk around indoors and cough well. You may stop using the incentive spirometer when instructed by your caregiver.  RISKS AND COMPLICATIONS  Take your time so you do not get dizzy or light-headed.  If you are in pain, you may need to take or ask for pain medication before doing incentive spirometry. It is harder to take a deep breath if you are having pain. AFTER USE  Rest and breathe slowly and easily.  It can be helpful to keep track of a log of your progress. Your caregiver can provide you with a simple table to help with this. If you are using the spirometer at home, follow these instructions: SEEK MEDICAL CARE IF:   You are having difficultly using the spirometer.  You have trouble using the spirometer as often as instructed.  Your pain medication is not giving enough relief while using the spirometer.  You develop fever of 100.5 F (38.1 C) or higher. SEEK IMMEDIATE MEDICAL CARE IF:   You cough up bloody sputum that had not been present before.  You develop fever of 102 F (38.9 C) or greater.  You develop worsening pain at or near the incision site. MAKE SURE YOU:   Understand these instructions.  Will watch your condition.  Will get help right away if you are not doing well or get worse. Document Released: 10/25/2006 Document Revised: 09/06/2011 Document Reviewed: 12/26/2006 [employer] Patient Information 2014  [employer].   ________________________________________________________________________  WHAT IS A BLOOD TRANSFUSION? Blood Transfusion Information  A transfusion is the replacement of blood or some of its parts. Blood is made up of multiple cells which provide different functions.  Red blood cells carry oxygen and are used for blood loss replacement.  White blood cells fight against infection.  Platelets control bleeding.  Plasma helps clot blood.  Other blood products are available for specialized needs, such as hemophilia or other clotting disorders. BEFORE THE TRANSFUSION  Who gives blood for transfusions?   Healthy volunteers who are fully evaluated to make sure their blood is safe. This is blood bank blood. Transfusion therapy is the safest it has ever been in the practice of medicine. Before blood is taken from a donor, a complete history is taken to make sure that person has no history of diseases nor engages in risky social behavior (examples are intravenous drug use or sexual activity with multiple partners). The donor's travel history is screened to minimize risk of transmitting infections, such as malaria. The donated blood is tested for signs of infectious diseases, such as HIV and hepatitis. The blood is then tested to be sure it is compatible with you in order to minimize the chance of a transfusion reaction. If you or a relative donates blood, this is often done in anticipation of surgery and is not appropriate for emergency situations. It takes many days to process the donated blood. RISKS AND COMPLICATIONS Although transfusion therapy is very safe and saves many lives, the main dangers of transfusion include:   Getting an infectious disease.  Developing a transfusion reaction. This is an allergic reaction to something in the blood you were given. Every precaution is taken to prevent this. The decision to have a blood transfusion has been considered carefully by your caregiver  before blood is given. Blood is not given unless the benefits outweigh the risks. AFTER THE TRANSFUSION  Right after receiving a blood transfusion, you will usually feel much better and more energetic. This is especially true if your red blood cells have gotten low (anemic). The transfusion raises the level of the red blood cells which carry oxygen, and this usually causes an energy increase.  The nurse administering the transfusion will monitor you carefully for complications. HOME CARE INSTRUCTIONS  No special instructions are needed after a transfusion. You may find your energy is better. Speak with your caregiver about any limitations on activity for underlying diseases you may have. SEEK MEDICAL CARE IF:   Your condition is not improving after your transfusion.  You develop redness or irritation at the intravenous (IV) site. SEEK IMMEDIATE MEDICAL CARE IF:  Any of the following symptoms occur over the next 12 hours:  Shaking chills.  You have a temperature by mouth above 102 F (38.9 C), not controlled by medicine.  Chest, back, or muscle pain.  People around you feel you are not acting correctly or are confused.  Shortness of breath or difficulty breathing.  Dizziness and fainting.  You get a rash or develop hives.  You have a decrease in urine output.  Your urine turns a dark color or changes to pink, red, or brown. Any of the following symptoms occur over the next 10 days:  You have a temperature by mouth above 102 F (38.9 C), not controlled by medicine.  Shortness of breath.  Weakness after normal activity.  The white part of the eye turns yellow (jaundice).  You have a decrease in  the amount of urine or are urinating less often.  Your urine turns a dark color or changes to pink, red, or brown. Document Released: 06/11/2000 Document Revised: 09/06/2011 Document Reviewed: 01/29/2008 [facility] Patient Information 2014 [place],  [place].  _______________________________________________________________________

## 2016-06-08 ENCOUNTER — Encounter (HOSPITAL_COMMUNITY): Payer: Self-pay

## 2016-06-08 ENCOUNTER — Encounter (HOSPITAL_COMMUNITY)
Admission: RE | Admit: 2016-06-08 | Discharge: 2016-06-08 | Disposition: A | Discharge status: Home / Self Care | Payer: BLUE CROSS/BLUE SHIELD | Source: Ambulatory Visit | Attending: Orthopedic Surgery | Admitting: Orthopedic Surgery

## 2016-06-08 DIAGNOSIS — Z01812 Encounter for preprocedural laboratory examination: Secondary | ICD-10-CM | POA: Diagnosis present

## 2016-06-08 DIAGNOSIS — M1612 Unilateral primary osteoarthritis, left hip: Secondary | ICD-10-CM | POA: Diagnosis not present

## 2016-06-08 HISTORY — DX: Unspecified osteoarthritis, unspecified site: M19.90

## 2016-06-08 HISTORY — DX: Sleep apnea, unspecified: G47.30

## 2016-06-08 HISTORY — DX: Personal history of other medical treatment: Z92.89

## 2016-06-08 HISTORY — DX: Abnormal electrocardiogram (ECG) (EKG): R94.31

## 2016-06-08 LAB — CBC
HEMATOCRIT: 44.8 % (ref 39.0–52.0)
Hemoglobin: 15 g/dL (ref 13.0–17.0)
MCH: 30.5 pg (ref 26.0–34.0)
MCHC: 33.5 g/dL (ref 30.0–36.0)
MCV: 91.1 fL (ref 78.0–100.0)
Platelets: 297 10*3/uL (ref 150–400)
RBC: 4.92 MIL/uL (ref 4.22–5.81)
RDW: 13.9 % (ref 11.5–15.5)
WBC: 8.3 10*3/uL (ref 4.0–10.5)

## 2016-06-08 LAB — SURGICAL PCR SCREEN
MRSA, PCR: NEGATIVE
Staphylococcus aureus: NEGATIVE

## 2016-06-08 LAB — ABO/RH: ABO/RH(D): A POS

## 2016-06-08 NOTE — Pre-Procedure Instructions (Signed)
EKG 03-03-16 with chart - Dr. Norval GableHungarland, labs- CBC/d, CMP, A1C, LOV notes Dr. Norval GableHungarland with chart  05-17-16 . Clearance note Dr. Lucretia FieldGrossman, Pulmonary with chart.

## 2016-06-14 MED ORDER — DEXTROSE 5 % IV SOLN
3.0000 g | INTRAVENOUS | Status: AC
  Administered 2016-06-15: 3 g via INTRAVENOUS
  Filled 2016-06-14 (×2): qty 3000

## 2016-06-14 NOTE — Progress Notes (Signed)
Clearance note per chart per Dr Teofilo PodZagol

## 2016-06-15 ENCOUNTER — Encounter (HOSPITAL_COMMUNITY)
Admission: RE | Disposition: A | Discharge status: Home / Self Care | Payer: Self-pay | Source: Ambulatory Visit | Attending: Orthopedic Surgery

## 2016-06-15 ENCOUNTER — Inpatient Hospital Stay (HOSPITAL_COMMUNITY): Payer: BLUE CROSS/BLUE SHIELD

## 2016-06-15 ENCOUNTER — Inpatient Hospital Stay (HOSPITAL_COMMUNITY): Payer: BLUE CROSS/BLUE SHIELD | Admitting: Anesthesiology

## 2016-06-15 ENCOUNTER — Encounter (HOSPITAL_COMMUNITY): Payer: Self-pay | Admitting: *Deleted

## 2016-06-15 ENCOUNTER — Inpatient Hospital Stay (HOSPITAL_COMMUNITY)
Admission: RE | Admit: 2016-06-15 | Discharge: 2016-06-16 | DRG: 470 | Disposition: A | Discharge status: Home / Self Care | Payer: BLUE CROSS/BLUE SHIELD | Source: Ambulatory Visit | Attending: Orthopedic Surgery | Admitting: Orthopedic Surgery

## 2016-06-15 DIAGNOSIS — M1612 Unilateral primary osteoarthritis, left hip: Principal | ICD-10-CM | POA: Diagnosis present

## 2016-06-15 DIAGNOSIS — G4730 Sleep apnea, unspecified: Secondary | ICD-10-CM | POA: Diagnosis present

## 2016-06-15 DIAGNOSIS — Z6841 Body Mass Index (BMI) 40.0 and over, adult: Secondary | ICD-10-CM | POA: Diagnosis not present

## 2016-06-15 DIAGNOSIS — Z96649 Presence of unspecified artificial hip joint: Secondary | ICD-10-CM

## 2016-06-15 HISTORY — PX: TOTAL HIP ARTHROPLASTY: SHX124

## 2016-06-15 LAB — TYPE AND SCREEN
ABO/RH(D): A POS
Antibody Screen: NEGATIVE

## 2016-06-15 IMAGING — DX DG HIP (WITH OR WITHOUT PELVIS) 1V PORT*L*
2 series · 2 of 2 positions shown · non-contrast
Comparison: None.

CLINICAL DATA: Status post hip replacement

EXAM:
DG HIP (WITH OR WITHOUT PELVIS) 1V PORT LEFT

[pelvis ap]
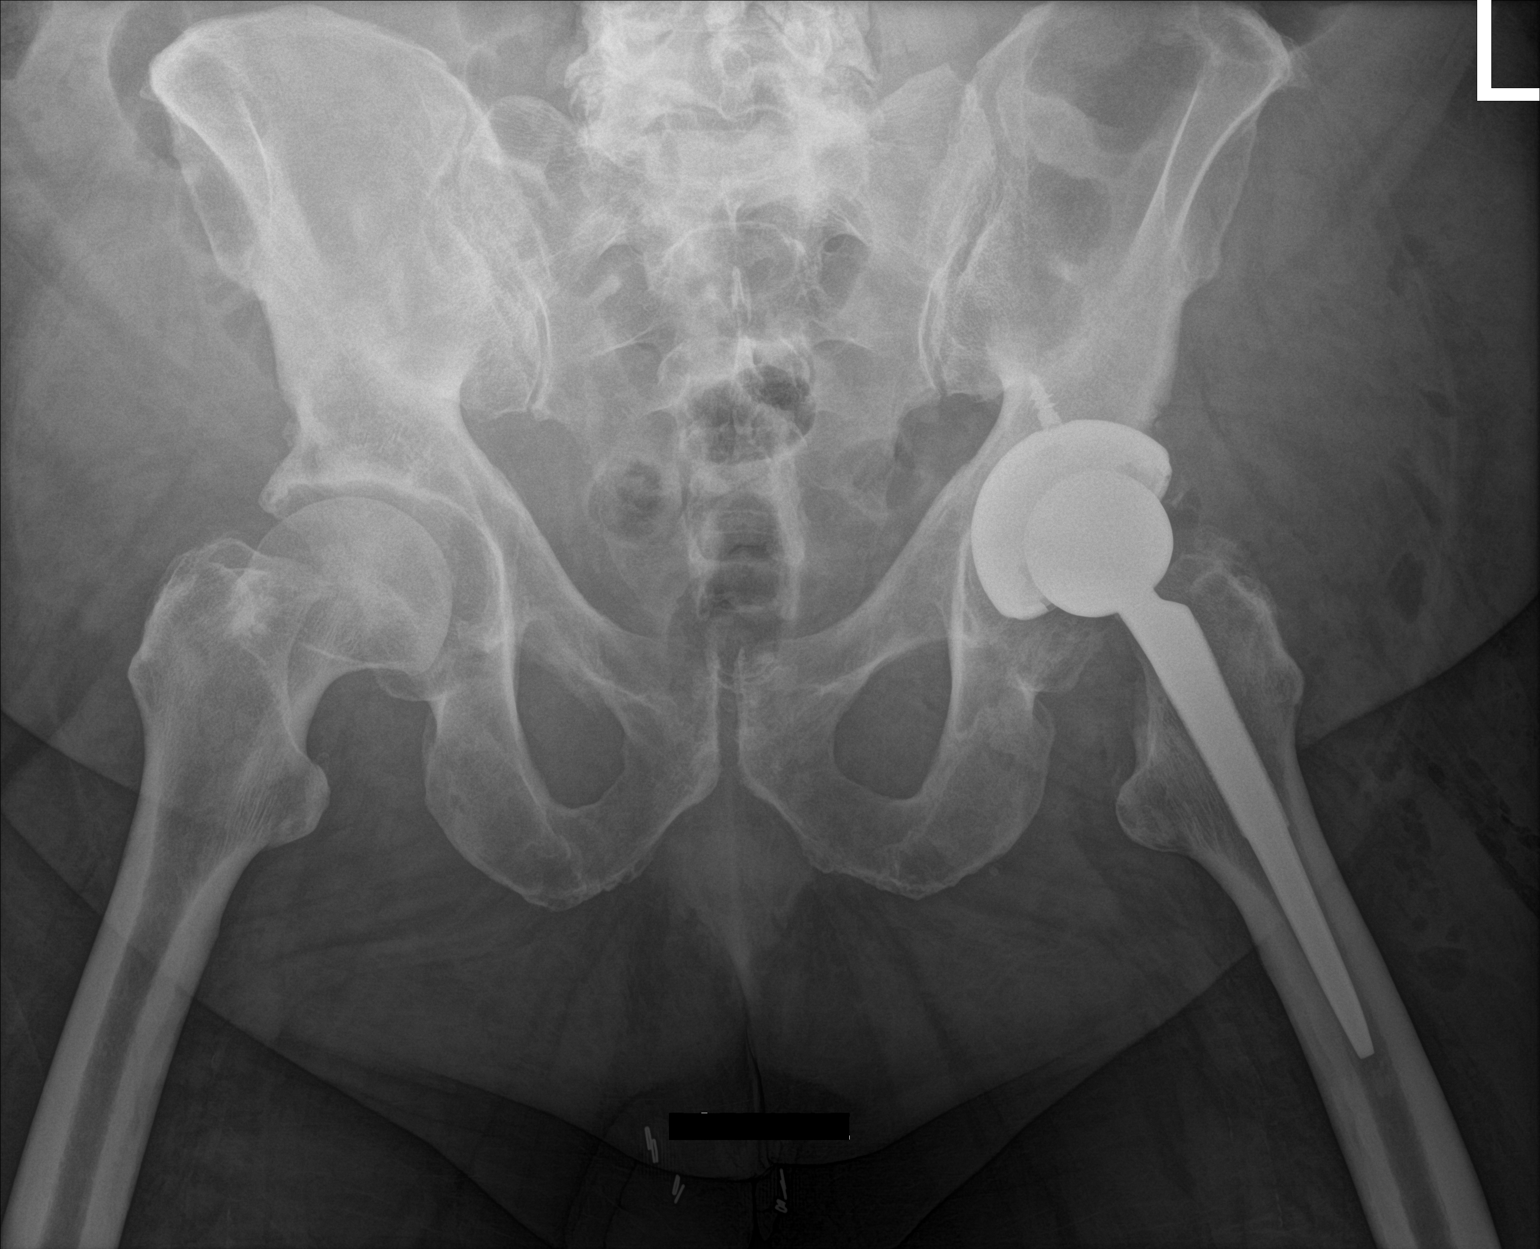

[hip lat]
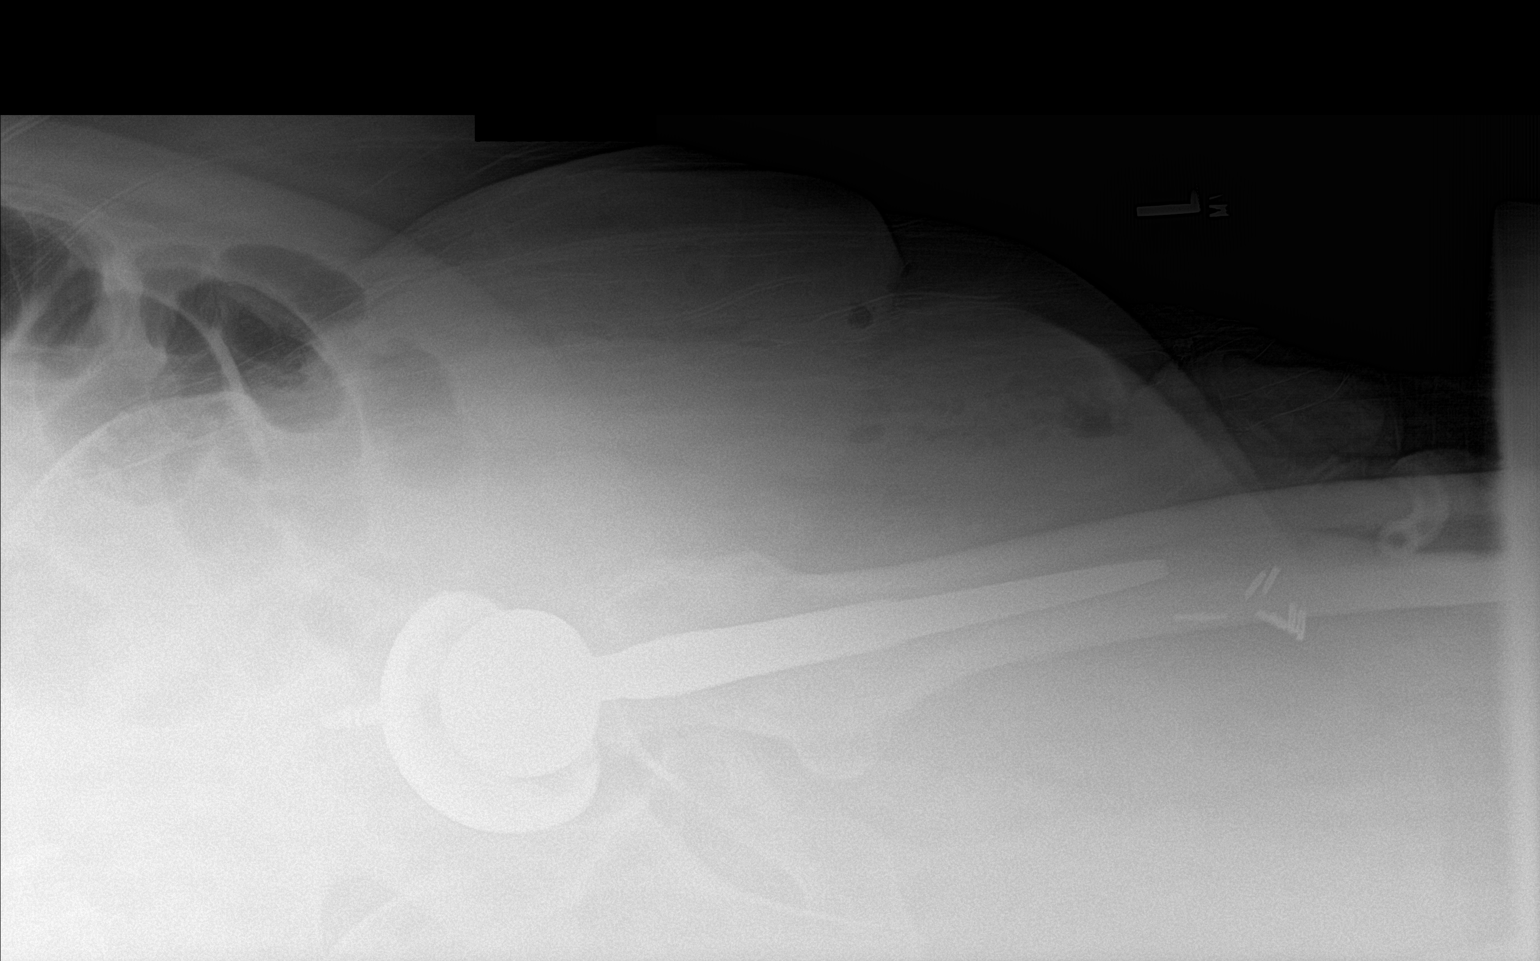

[2 of 2 positions shown; findings below may reference images not displayed]

FINDINGS: The patient is status post left hip replacement with anatomic
alignment. No acute fracture. Pubic symphysis appears intact. Small
amount of soft tissue gas lateral to the site consistent with
postsurgical change.
IMPRESSION: Status post left hip replacement with anatomic alignment of hardware

## 2016-06-15 SURGERY — ARTHROPLASTY, HIP, TOTAL, ANTERIOR APPROACH
Anesthesia: Spinal | Site: Hip | Laterality: Left

## 2016-06-15 MED ORDER — TRANEXAMIC ACID 1000 MG/10ML IV SOLN
1000.0000 mg | INTRAVENOUS | Status: AC
  Administered 2016-06-15: 1000 mg via INTRAVENOUS
  Filled 2016-06-15: qty 10

## 2016-06-15 MED ORDER — MIDAZOLAM HCL 2 MG/2ML IJ SOLN
INTRAMUSCULAR | Status: AC
  Filled 2016-06-15: qty 2

## 2016-06-15 MED ORDER — PHENOL 1.4 % MT LIQD
1.0000 | OROMUCOSAL | Status: DC | PRN
  Filled 2016-06-15: qty 177

## 2016-06-15 MED ORDER — BISACODYL 10 MG RE SUPP: 10.0000 mg | Freq: Every day | RECTAL | Status: DC | PRN

## 2016-06-15 MED ORDER — ONDANSETRON HCL 4 MG/2ML IJ SOLN
4.0000 mg | Freq: Four times a day (QID) | INTRAMUSCULAR | Status: DC | PRN
  Administered 2016-06-16: 4 mg via INTRAVENOUS
  Filled 2016-06-15: qty 2

## 2016-06-15 MED ORDER — METOCLOPRAMIDE HCL 5 MG PO TABS
5.0000 mg | ORAL_TABLET | Freq: Three times a day (TID) | ORAL | Status: DC | PRN
Start: 2016-06-15 — End: 2016-06-16

## 2016-06-15 MED ORDER — CEFAZOLIN SODIUM-DEXTROSE 2-4 GM/100ML-% IV SOLN
2.0000 g | Freq: Four times a day (QID) | INTRAVENOUS | Status: AC
  Administered 2016-06-15 – 2016-06-16 (×2): 2 g via INTRAVENOUS
  Filled 2016-06-15 (×2): qty 100

## 2016-06-15 MED ORDER — FENTANYL CITRATE (PF) 100 MCG/2ML IJ SOLN
INTRAMUSCULAR | Status: AC
  Filled 2016-06-15: qty 2

## 2016-06-15 MED ORDER — FERROUS SULFATE 325 (65 FE) MG PO TABS
325.0000 mg | ORAL_TABLET | Freq: Three times a day (TID) | ORAL | Status: DC
  Administered 2016-06-16 (×2): 325 mg via ORAL
  Filled 2016-06-15 (×2): qty 1

## 2016-06-15 MED ORDER — ONDANSETRON HCL 4 MG/2ML IJ SOLN
INTRAMUSCULAR | Status: AC
  Filled 2016-06-15: qty 2

## 2016-06-15 MED ORDER — CELECOXIB 200 MG PO CAPS
200.0000 mg | ORAL_CAPSULE | Freq: Two times a day (BID) | ORAL | Status: DC
  Administered 2016-06-15 – 2016-06-16 (×2): 200 mg via ORAL
  Filled 2016-06-15 (×2): qty 1

## 2016-06-15 MED ORDER — PROPOFOL 500 MG/50ML IV EMUL: INTRAVENOUS | Status: DC | PRN

## 2016-06-15 MED ORDER — EPHEDRINE SULFATE-NACL 50-0.9 MG/10ML-% IV SOSY
PREFILLED_SYRINGE | INTRAVENOUS | Status: DC | PRN
  Administered 2016-06-15 (×6): 5 mg via INTRAVENOUS

## 2016-06-15 MED ORDER — FENTANYL CITRATE (PF) 100 MCG/2ML IJ SOLN
INTRAMUSCULAR | Status: DC | PRN
  Administered 2016-06-15: 25 ug via INTRAVENOUS
  Administered 2016-06-15: 75 ug via INTRAVENOUS

## 2016-06-15 MED ORDER — ONDANSETRON HCL 4 MG/2ML IJ SOLN
INTRAMUSCULAR | Status: DC | PRN
  Administered 2016-06-15: 4 mg via INTRAVENOUS

## 2016-06-15 MED ORDER — FENTANYL CITRATE (PF) 100 MCG/2ML IJ SOLN
INTRAMUSCULAR | Status: AC
  Administered 2016-06-15: 25 ug via INTRAVENOUS
  Filled 2016-06-15: qty 2

## 2016-06-15 MED ORDER — MIDAZOLAM HCL 5 MG/5ML IJ SOLN
INTRAMUSCULAR | Status: DC | PRN
  Administered 2016-06-15: 2 mg via INTRAVENOUS

## 2016-06-15 MED ORDER — BUPIVACAINE IN DEXTROSE 0.75-8.25 % IT SOLN
INTRATHECAL | Status: DC | PRN
  Administered 2016-06-15: 1.7 mL via INTRATHECAL

## 2016-06-15 MED ORDER — PROPOFOL 500 MG/50ML IV EMUL
INTRAVENOUS | Status: DC | PRN
  Administered 2016-06-15: 50 ug/kg/min via INTRAVENOUS

## 2016-06-15 MED ORDER — METHOCARBAMOL 500 MG PO TABS
500.0000 mg | ORAL_TABLET | Freq: Four times a day (QID) | ORAL | Status: DC | PRN
  Administered 2016-06-15: 500 mg via ORAL
  Filled 2016-06-15: qty 1

## 2016-06-15 MED ORDER — METHOCARBAMOL 1000 MG/10ML IJ SOLN
500.0000 mg | Freq: Four times a day (QID) | INTRAVENOUS | Status: DC | PRN
  Administered 2016-06-15: 500 mg via INTRAVENOUS
  Filled 2016-06-15 (×2): qty 5
  Filled 2016-06-15: qty 550

## 2016-06-15 MED ORDER — PROPOFOL 10 MG/ML IV BOLUS
INTRAVENOUS | Status: AC
  Filled 2016-06-15: qty 60

## 2016-06-15 MED ORDER — ALUM & MAG HYDROXIDE-SIMETH 200-200-20 MG/5ML PO SUSP: 30.0000 mL | ORAL | Status: DC | PRN

## 2016-06-15 MED ORDER — DEXAMETHASONE SODIUM PHOSPHATE 10 MG/ML IJ SOLN
INTRAMUSCULAR | Status: AC
  Filled 2016-06-15: qty 1

## 2016-06-15 MED ORDER — STERILE WATER FOR IRRIGATION IR SOLN
Status: DC | PRN
  Administered 2016-06-15: 2000 mL

## 2016-06-15 MED ORDER — DEXAMETHASONE SODIUM PHOSPHATE 10 MG/ML IJ SOLN
10.0000 mg | Freq: Once | INTRAMUSCULAR | Status: AC
  Administered 2016-06-16: 10 mg via INTRAVENOUS
  Filled 2016-06-15: qty 1

## 2016-06-15 MED ORDER — DOCUSATE SODIUM 100 MG PO CAPS
100.0000 mg | ORAL_CAPSULE | Freq: Two times a day (BID) | ORAL | Status: DC
  Administered 2016-06-15 – 2016-06-16 (×2): 100 mg via ORAL
  Filled 2016-06-15 (×2): qty 1

## 2016-06-15 MED ORDER — SODIUM CHLORIDE 0.9 % IV SOLN
100.0000 mL/h | INTRAVENOUS | Status: DC
  Administered 2016-06-15: 100 mL/h via INTRAVENOUS
  Filled 2016-06-15 (×4): qty 1000

## 2016-06-15 MED ORDER — MAGNESIUM CITRATE PO SOLN: 1.0000 | Freq: Once | ORAL | Status: DC | PRN

## 2016-06-15 MED ORDER — DIPHENHYDRAMINE HCL 25 MG PO CAPS: 25.0000 mg | ORAL_CAPSULE | Freq: Four times a day (QID) | ORAL | Status: DC | PRN

## 2016-06-15 MED ORDER — CHLORHEXIDINE GLUCONATE 4 % EX LIQD: 60.0000 mL | Freq: Once | CUTANEOUS | Status: DC

## 2016-06-15 MED ORDER — ONDANSETRON HCL 4 MG PO TABS: 4.0000 mg | ORAL_TABLET | Freq: Four times a day (QID) | ORAL | Status: DC | PRN

## 2016-06-15 MED ORDER — ASPIRIN 81 MG PO CHEW
81.0000 mg | CHEWABLE_TABLET | Freq: Two times a day (BID) | ORAL | Status: DC
  Administered 2016-06-15 – 2016-06-16 (×2): 81 mg via ORAL
  Filled 2016-06-15 (×2): qty 1

## 2016-06-15 MED ORDER — METOCLOPRAMIDE HCL 5 MG/ML IJ SOLN: 5.0000 mg | Freq: Three times a day (TID) | INTRAMUSCULAR | Status: DC | PRN

## 2016-06-15 MED ORDER — POLYETHYLENE GLYCOL 3350 17 G PO PACK
17.0000 g | PACK | Freq: Two times a day (BID) | ORAL | Status: DC
  Administered 2016-06-15 – 2016-06-16 (×2): 17 g via ORAL
  Filled 2016-06-15 (×2): qty 1

## 2016-06-15 MED ORDER — LACTATED RINGERS IV SOLN
INTRAVENOUS | Status: DC
  Administered 2016-06-15: 1000 mL via INTRAVENOUS
  Administered 2016-06-15 (×3): via INTRAVENOUS

## 2016-06-15 MED ORDER — PROPOFOL 10 MG/ML IV BOLUS
INTRAVENOUS | Status: AC
  Filled 2016-06-15: qty 20

## 2016-06-15 MED ORDER — EPHEDRINE 5 MG/ML INJ
INTRAVENOUS | Status: AC
  Filled 2016-06-15: qty 10

## 2016-06-15 MED ORDER — FENTANYL CITRATE (PF) 100 MCG/2ML IJ SOLN
25.0000 ug | INTRAMUSCULAR | Status: DC | PRN
  Administered 2016-06-15: 25 ug via INTRAVENOUS
  Administered 2016-06-15: 50 ug via INTRAVENOUS

## 2016-06-15 MED ORDER — HYDROCODONE-ACETAMINOPHEN 7.5-325 MG PO TABS
1.0000 | ORAL_TABLET | ORAL | Status: DC
  Administered 2016-06-15 – 2016-06-16 (×5): 2 via ORAL
  Filled 2016-06-15 (×5): qty 2

## 2016-06-15 MED ORDER — 0.9 % SODIUM CHLORIDE (POUR BTL) OPTIME
TOPICAL | Status: DC | PRN
  Administered 2016-06-15: 1000 mL

## 2016-06-15 MED ORDER — MENTHOL 3 MG MT LOZG: 1.0000 | LOZENGE | OROMUCOSAL | Status: DC | PRN

## 2016-06-15 MED ORDER — HYDROMORPHONE HCL 1 MG/ML IJ SOLN
0.5000 mg | INTRAMUSCULAR | Status: DC | PRN
  Administered 2016-06-15: 22:00:00 1 mg via INTRAVENOUS
  Filled 2016-06-15: qty 1

## 2016-06-15 MED ORDER — DEXAMETHASONE SODIUM PHOSPHATE 10 MG/ML IJ SOLN
10.0000 mg | Freq: Once | INTRAMUSCULAR | Status: AC
  Administered 2016-06-15: 10 mg via INTRAVENOUS

## 2016-06-15 SURGICAL SUPPLY — 31 items
BLADE SAG 18X100X1.27 (BLADE) ×3 IMPLANT
CAPT HIP TOTAL 2 ×3 IMPLANT
CLOTH BEACON ORANGE TIMEOUT ST (SAFETY) ×3 IMPLANT
COVER PERINEAL POST (MISCELLANEOUS) ×3 IMPLANT
DERMABOND ADVANCED (GAUZE/BANDAGES/DRESSINGS) ×2
DERMABOND ADVANCED .7 DNX12 (GAUZE/BANDAGES/DRESSINGS) ×1 IMPLANT
DRAPE STERI IOBAN 125X83 (DRAPES) ×3 IMPLANT
DRAPE U-SHAPE 47X51 STRL (DRAPES) ×6 IMPLANT
DRSG AQUACEL AG ADV 3.5X10 (GAUZE/BANDAGES/DRESSINGS) ×3 IMPLANT
DURAPREP 26ML APPLICATOR (WOUND CARE) ×3 IMPLANT
ELECT REM PT RETURN 9FT ADLT (ELECTROSURGICAL) ×3
ELECTRODE REM PT RTRN 9FT ADLT (ELECTROSURGICAL) ×1 IMPLANT
GLOVE BIOGEL M STRL SZ7.5 (GLOVE) ×6 IMPLANT
GLOVE BIOGEL PI IND STRL 7.0 (GLOVE) ×3 IMPLANT
GLOVE BIOGEL PI IND STRL 7.5 (GLOVE) ×2 IMPLANT
GLOVE BIOGEL PI IND STRL 8.5 (GLOVE) ×1 IMPLANT
GLOVE BIOGEL PI INDICATOR 7.0 (GLOVE) ×6
GLOVE BIOGEL PI INDICATOR 7.5 (GLOVE) ×4
GLOVE BIOGEL PI INDICATOR 8.5 (GLOVE) ×2
GLOVE ECLIPSE 8.0 STRL XLNG CF (GLOVE) ×6 IMPLANT
GLOVE ORTHO TXT STRL SZ7.5 (GLOVE) ×6 IMPLANT
GOWN STRL REUS W/TWL XL LVL3 (GOWN DISPOSABLE) ×12 IMPLANT
HOLDER FOLEY CATH W/STRAP (MISCELLANEOUS) ×3 IMPLANT
PACK ANTERIOR HIP CUSTOM (KITS) ×3 IMPLANT
SUT MNCRL AB 4-0 PS2 18 (SUTURE) ×3 IMPLANT
SUT VIC AB 1 CT1 36 (SUTURE) ×9 IMPLANT
SUT VIC AB 2-0 CT1 27 (SUTURE) ×4
SUT VIC AB 2-0 CT1 TAPERPNT 27 (SUTURE) ×2 IMPLANT
SUT VLOC 180 0 24IN GS25 (SUTURE) ×3 IMPLANT
TRAY FOLEY W/METER SILVER 16FR (SET/KITS/TRAYS/PACK) ×3 IMPLANT
YANKAUER SUCT BULB TIP 10FT TU (MISCELLANEOUS) ×3 IMPLANT

## 2016-06-15 NOTE — Anesthesia Preprocedure Evaluation (Addendum)
Anesthesia Evaluation  Patient identified by MRN, date of birth, ID band Patient awake    Reviewed: Allergy & Precautions, NPO status , Patient's Chart, lab work & pertinent test results  Airway Mallampati: II  TM Distance: >3 FB     Dental   Pulmonary sleep apnea ,    breath sounds clear to auscultation       Cardiovascular negative cardio ROS   Rhythm:Regular Rate:Normal     Neuro/Psych    GI/Hepatic negative GI ROS, Neg liver ROS,   Endo/Other  negative endocrine ROS  Renal/GU negative Renal ROS     Musculoskeletal  (+) Arthritis ,   Abdominal   Peds  Hematology   Anesthesia Other Findings   Reproductive/Obstetrics                             Anesthesia Physical Anesthesia Plan  ASA: II  Anesthesia Plan: Spinal   Post-op Pain Management:    Induction: Intravenous  Airway Management Planned: Simple Face Mask  Additional Equipment:   Intra-op Plan:   Post-operative Plan:   Informed Consent: I have reviewed the patients History and Physical, chart, labs and discussed the procedure including the risks, benefits and alternatives for the proposed anesthesia with the patient or authorized representative who has indicated his/her understanding and acceptance.   Dental advisory given  Plan Discussed with: CRNA and Anesthesiologist  Anesthesia Plan Comments:        Anesthesia Quick Evaluation

## 2016-06-15 NOTE — Anesthesia Procedure Notes (Signed)
Spinal  Patient location during procedure: OR Preanesthetic Checklist Completed: patient identified, site marked, surgical consent, pre-op evaluation, timeout performed, IV checked, risks and benefits discussed and monitors and equipment checked Spinal Block Patient position: sitting Prep: DuraPrep Patient monitoring: heart rate, cardiac monitor, continuous pulse ox and blood pressure Approach: midline Location: L3-4 Injection technique: single-shot Needle Needle type: Sprotte  Needle gauge: 24 G Needle length: 9 cm Assessment Sensory level: T4 Additional Notes Spinal Dosage in OR  Bupivicaine ml       1.7 LLD x 3 min     

## 2016-06-15 NOTE — Op Note (Signed)
NAME:  Marco Kennedy                ACCOUNT NO.: [id number]      MEDICAL RECORD NO.: [id number]      FACILITY:  [facility]      PHYSICIAN:  [name] D  DATE OF BIRTH:  1950/10/09     DATE OF PROCEDURE:  06/15/2016                                 OPERATIVE REPORT         PREOPERATIVE DIAGNOSIS: Left  hip osteoarthritis.      POSTOPERATIVE DIAGNOSIS:  Left hip osteoarthritis.      PROCEDURE:  Left total hip replacement through an anterior approach   utilizing DePuy THR system, component size 56mm pinnacle cup, a size 36+4 neutral   Altrex liner, a size 3 Hi Tri Lock stem with a 36+8.5 delta ceramic   ball.      SURGEON:  Madlyn Frankel. Charlann Boxer, M.D.      ASSISTANT:  Skip Mayer, PA-C     ANESTHESIA:  Spinal.      SPECIMENS:  None.      COMPLICATIONS:  None.      BLOOD LOSS:  500 cc     DRAINS:  None.      INDICATION OF THE PROCEDURE:  Marco Kennedy is a 65 y.o. male who had   presented to office for evaluation of left hip pain.  Radiographs revealed   progressive degenerative changes with bone-on-bone   articulation to the  hip joint.  The patient had painful limited range of   motion significantly affecting their overall quality of life.  The patient was failing to    respond to conservative measures, and at this point was ready   to proceed with more definitive measures.  The patient has noted progressive   degenerative changes in his hip, progressive problems and dysfunction   with regarding the hip prior to surgery.  Consent was obtained for   benefit of pain relief.  Specific risk of infection, DVT, component   failure, dislocation, need for revision surgery, as well discussion of   the anterior versus posterior approach were reviewed.  Consent was   obtained for benefit of anterior pain relief through an anterior   approach.      PROCEDURE IN DETAIL:  The patient was brought to operative theater.   Once adequate anesthesia, preoperative  antibiotics, 2gm of Ancef, 1 gm of Tranexamic Acid, and 10 mg of Decadron administered.   The patient was positioned supine on the OSI Hanna table.  Once adequate   padding of boney process was carried out, we had predraped out the hip, and  used fluoroscopy to confirm orientation of the pelvis and position.      The left hip was then prepped and draped from proximal iliac crest to   mid thigh with shower curtain technique.      Time-out was performed identifying the patient, planned procedure, and   extremity.     An incision was then made 2 cm distal and lateral to the   anterior superior iliac spine extending over the orientation of the   tensor fascia lata muscle and sharp dissection was carried down to the   fascia of the muscle and protractor placed in the soft tissues.      The fascia was  then incised.  The muscle belly was identified and swept   laterally and retractor placed along the superior neck.  Following   cauterization of the circumflex vessels and removing some pericapsular   fat, a second cobra retractor was placed on the inferior neck.  A third   retractor was placed on the anterior acetabulum after elevating the   anterior rectus.  A L-capsulotomy was along the line of the   superior neck to the trochanteric fossa, then extended proximally and   distally.  Tag sutures were placed and the retractors were then placed   intracapsular.  We then identified the trochanteric fossa and   orientation of my neck cut, confirmed this radiographically   and then made a neck osteotomy with the femur on traction.  The femoral   head was removed without difficulty or complication.  Traction was let   off and retractors were placed posterior and anterior around the   acetabulum.      The labrum and foveal tissue were debrided.  I began reaming with a 49mm   reamer and reamed up to 55mm reamer with good bony bed preparation and a 56mm   cup was chosen.  The final 56mm Pinnacle cup  was then impacted under fluoroscopy  to confirm the depth of penetration and orientation with respect to   abduction.  A screw was placed followed by the hole eliminator.  The final   36+4 neutral Altrex liner was impacted with good visualized rim fit.  The cup was positioned anatomically within the acetabular portion of the pelvis.      At this point, the femur was rolled at 80 degrees.  Further capsule was   released off the inferior aspect of the femoral neck.  I then   released the superior capsule proximally.  The hook was placed laterally   along the femur and elevated manually and held in position with the bed   hook.  The leg was then extended and adducted with the leg rolled to 100   degrees of external rotation.  Once the proximal femur was fully   exposed, I used a box osteotome to set orientation.  I then began   broaching with the starting chili pepper broach and passed this by hand and then broached up to 3 (very tight, type A, femur).  With the 3 broach in place I chose a high offset neck and did several trial reductions.  The offset was appropriate, leg lengths   appeared to be equal best matched with the +8.5 head ball confirmed radiographically.   Given these findings, I went ahead and dislocated the hip, repositioned all   retractors and positioned the right hip in the extended and abducted position.  The final 3 Hi Tri Lock stem was   chosen and it was impacted down to the level of neck cut.  Based on this   and the trial reduction, a 36+8.5 delta ceramic ball was chosen and   impacted onto a clean and dry trunnion, and the hip was reduced.  The   hip had been irrigated throughout the case again at this point.  I did   reapproximate the superior capsular leaflet to the anterior leaflet   using #1 Vicryl.  The fascia of the   tensor fascia lata muscle was then reapproximated using #1 Vicryl and #0 V-lock sutures.  The   remaining wound was closed with 2-0 Vicryl and running  4-0 Monocryl.   The  hip was cleaned, dried, and dressed sterilely using Dermabond and   Aquacel dressing.  He was then brought   to recovery room in stable condition tolerating the procedure well.    Skip Mayer, PA-C was present for the entirety of the case involved from   preoperative positioning, perioperative retractor management, general   facilitation of the case, as well as primary wound closure as assistant.            Madlyn Frankel Charlann Boxer, M.D.        06/15/2016 3:53 PM

## 2016-06-15 NOTE — Interval H&P Note (Signed)
History and Physical Interval Note:  06/15/2016 1:10 PM  [name]  has presented today for surgery, with the diagnosis of left hip DJD  The various methods of treatment have been discussed with the patient and family. After consideration of risks, benefits and other options for treatment, the patient has consented to  Procedure(s): LEFT TOTAL HIP ARTHROPLASTY ANTERIOR APPROACH (Left) as a surgical intervention .  The patient's history has been reviewed, patient examined, no change in status, stable for surgery.  I have reviewed the patient's chart and labs.  Questions were answered to the patient's satisfaction.     Shelda PalLIN,Ebin Palazzi D

## 2016-06-15 NOTE — Transfer of Care (Signed)
Immediate Anesthesia Transfer of Care Note  Patient: Marco Kennedy  Procedure(s) Performed: Procedure(s): LEFT TOTAL HIP ARTHROPLASTY ANTERIOR APPROACH (Left)  Patient Location: PACU  Anesthesia Type:Spinal  Level of Consciousness:  sedated, patient cooperative and responds to stimulation  Airway & Oxygen Therapy:Patient Spontanous Breathing and Patient connected to face mask oxgen  Post-op Assessment:  Report given to PACU RN and Post -op Vital signs reviewed and stable  Post vital signs:  Reviewed and stable  Last Vitals:  Vitals:   06/15/16 1058  BP: (!) 179/81  Pulse: (!) 59  Resp: 18  Temp: 36.9 C    Complications: No apparent anesthesia complications

## 2016-06-16 ENCOUNTER — Encounter (HOSPITAL_COMMUNITY): Payer: Self-pay | Admitting: Orthopedic Surgery

## 2016-06-16 LAB — BASIC METABOLIC PANEL
Anion gap: 8 (ref 5–15)
BUN: 18 mg/dL (ref 6–20)
CO2: 27 mmol/L (ref 22–32)
Calcium: 8.5 mg/dL — ABNORMAL LOW (ref 8.9–10.3)
Chloride: 103 mmol/L (ref 101–111)
Creatinine, Ser: 0.63 mg/dL (ref 0.61–1.24)
GFR calc Af Amer: >60 mL/min (ref >60)
Glucose, Bld: 165 mg/dL — ABNORMAL HIGH (ref 65–99)
POTASSIUM: 4.2 mmol/L (ref 3.5–5.1)
SODIUM: 138 mmol/L (ref 135–145)

## 2016-06-16 LAB — CBC
HCT: 37.1 % — ABNORMAL LOW (ref 39.0–52.0)
Hemoglobin: 13 g/dL (ref 13.0–17.0)
MCH: 30.4 pg (ref 26.0–34.0)
MCHC: 35 g/dL (ref 30.0–36.0)
MCV: 86.9 fL (ref 78.0–100.0)
PLATELETS: 281 10*3/uL (ref 150–400)
RBC: 4.27 MIL/uL (ref 4.22–5.81)
RDW: 13.3 % (ref 11.5–15.5)
WBC: 17.2 10*3/uL — ABNORMAL HIGH (ref 4.0–10.5)

## 2016-06-16 MED ORDER — DOCUSATE SODIUM 100 MG PO CAPS: 100.0000 mg | ORAL_CAPSULE | Freq: Two times a day (BID) | ORAL | 0 refills | Status: DC

## 2016-06-16 MED ORDER — FERROUS SULFATE 325 (65 FE) MG PO TABS: 325.0000 mg | ORAL_TABLET | Freq: Three times a day (TID) | ORAL | 3 refills | Status: DC

## 2016-06-16 MED ORDER — ASPIRIN 81 MG PO CHEW: 81.0000 mg | CHEWABLE_TABLET | Freq: Two times a day (BID) | ORAL | 0 refills | Status: AC

## 2016-06-16 MED ORDER — HYDROCODONE-ACETAMINOPHEN 7.5-325 MG PO TABS: 1.0000 | ORAL_TABLET | ORAL | 0 refills | Status: DC | PRN

## 2016-06-16 MED ORDER — POLYETHYLENE GLYCOL 3350 17 G PO PACK: 17.0000 g | PACK | Freq: Two times a day (BID) | ORAL | 0 refills | Status: DC

## 2016-06-16 MED ORDER — METHOCARBAMOL 500 MG PO TABS: 500.0000 mg | ORAL_TABLET | Freq: Four times a day (QID) | ORAL | 0 refills | Status: DC | PRN

## 2016-06-16 NOTE — Evaluation (Signed)
Occupational Therapy Evaluation Patient Details Name: Marco Kennedy MRN: [id number] DOB: 1951-06-03 Today's Date: 06/16/2016    History of Present Illness Pt s/p L THR and with hx of back surgery   Clinical Impression   This 65 year old man was admitted for the above sx. All education was completed. No further OT is needed at this time    Follow Up Recommendations  No OT follow up;Supervision/Assistance - 24 hour    Equipment Recommendations  None recommended by OT    Recommendations for Other Services       Precautions / Restrictions Precautions Precautions: Fall Restrictions Weight Bearing Restrictions: No Other Position/Activity Restrictions: WBAT      Mobility Bed Mobility              Transfers Overall transfer level: Needs assistance Equipment used: Rolling walker (2 wheeled) Transfers: Sit to/from Stand Sit to Stand: Min guard         General transfer comment: cues for LE management and use of UEs to self assist    Balance                                            ADL Overall ADL's : Needs assistance/impaired     Grooming: Set up;Supervision/safety;Standing       Lower Body Bathing: Minimal assistance;Sit to/from stand       Lower Body Dressing: Moderate assistance;Sit to/from stand   Toilet Transfer: Min guard;Ambulation;Comfort height toilet;RW       Tub/ Shower Transfer: Walk-in shower;Min guard;Ambulation;Shower seat     General ADL Comments: pt plans to stay at mother-in-law's; she recently passed away.  Home is set up well.  Wife will assist as needed with adls.     Vision     Perception     Praxis      Pertinent Vitals/Pain Pain Assessment: 0-10 Pain Score: 3  Pain Location: L hip Pain Descriptors / Indicators: Aching;Sore Pain Intervention(s): Limited activity within patient's tolerance;Monitored during session;Premedicated before session;Ice applied     Hand Dominance      Extremity/Trunk Assessment Upper Extremity Assessment Upper Extremity Assessment: Overall WFL for tasks assessed      Cervical / Trunk Assessment Cervical / Trunk Assessment: Normal   Communication Communication Communication: No difficulties   Cognition Arousal/Alertness: Awake/alert Behavior During Therapy: WFL for tasks assessed/performed Overall Cognitive Status: Within Functional Limits for tasks assessed                     General Comments       Exercises      Shoulder Instructions      Home Living Family/patient expects to be discharged to:: Private residence Living Arrangements: Spouse/significant other Available Help at Discharge: Family Type of Home: House Home Access: Stairs to enter Secretary/administratorntrance Stairs-Number of Steps: 6 Entrance Stairs-Rails: Right Home Layout: One level               Home Equipment: Walker - 2 wheels   Additional Comments: Wife will bring RW in to adjust.  Plans to stay at mother-in-law's. She recently died. She has a high commode with rails and a shower stall with seat      Prior Functioning/Environment Level of Independence: Independent                 OT Problem List:  OT Treatment/Interventions:      OT Goals(Current goals can be found in the care plan section) Acute Rehab OT Goals Patient Stated Goal: Regain IND OT Goal Formulation: All assessment and education complete, DC therapy  OT Frequency:     Barriers to D/C:            Co-evaluation              End of Session    Activity Tolerance: Patient tolerated treatment well Patient left: in chair;with call bell/phone within reach   Time: 1027-1046 OT Time Calculation (min): 19 min Charges:  OT General Charges $OT Visit: 1 Procedure OT Evaluation $OT Eval Low Complexity: 1 Procedure G-Codes:    Marco Kennedy 06/16/2016, 12:29 PM Marco Kennedy, OTR/L [phone] 06/16/2016 Marco Kennedy, OTR/L [phone] 06/16/2016

## 2016-06-16 NOTE — Evaluation (Signed)
Physical Therapy Evaluation Patient Details Name: Marco Kennedy MRN: [id number] DOB: Jul 12, 1950 Today's Date: 06/16/2016   History of Present Illness  Pt s/p L THR and with hx of back surgery  Clinical Impression  Pt s/p L THR presents with decreased L LE strength/ROM and post op pain limiting functional mobility.  Pt should progress to dc home with family assist.    Follow Up Recommendations Home health PT    Equipment Recommendations  None recommended by PT    Recommendations for Other Services OT consult     Precautions / Restrictions Precautions Precautions: Fall Restrictions Weight Bearing Restrictions: No Other Position/Activity Restrictions: WBAT      Mobility  Bed Mobility Overal bed mobility: Needs Assistance Bed Mobility: Supine to Sit     Supine to sit: Min assist     General bed mobility comments: cues for sequence and use of R LE to self assist  Transfers Overall transfer level: Needs assistance Equipment used: Rolling walker (2 wheeled) Transfers: Sit to/from Stand Sit to Stand: Min assist         General transfer comment: cues for LE management and use of UEs to self assist  Ambulation/Gait Ambulation/Gait assistance: Min assist;Min guard Ambulation Distance (Feet): 222 Feet Assistive device: Rolling walker (2 wheeled) Gait Pattern/deviations: Step-to pattern;Step-through pattern;Decreased step length - right;Decreased step length - left;Shuffle;Trunk flexed Gait velocity: dec Gait velocity interpretation: Below normal speed for age/gender General Gait Details: cues for posture, position from RW and initial sequence  Stairs            Wheelchair Mobility    Modified Rankin (Stroke Patients Only)       Balance                                             Pertinent Vitals/Pain Pain Assessment: 0-10 Pain Score: 3  Pain Location: L hip Pain Descriptors / Indicators: Aching;Sore Pain Intervention(s):  Limited activity within patient's tolerance;Monitored during session;Premedicated before session;Ice applied    Home Living Family/patient expects to be discharged to:: Private residence Living Arrangements: Spouse/significant other Available Help at Discharge: Family Type of Home: House Home Access: Stairs to enter Entrance Stairs-Rails: Right Entrance Stairs-Number of Steps: 6 Home Layout: One level Home Equipment: Environmental consultantWalker - 2 wheels Additional Comments: Wife will bring RW in to adjust    Prior Function Level of Independence: Independent               Hand Dominance        Extremity/Trunk Assessment   Upper Extremity Assessment Upper Extremity Assessment: Overall WFL for tasks assessed    Lower Extremity Assessment Lower Extremity Assessment: LLE deficits/detail LLE Deficits / Details: Strength at hip 2+/5 with AAROM at hip to 90 flex and 20 abd    Cervical / Trunk Assessment Cervical / Trunk Assessment: Normal  Communication   Communication: No difficulties  Cognition Arousal/Alertness: Awake/alert Behavior During Therapy: WFL for tasks assessed/performed Overall Cognitive Status: Within Functional Limits for tasks assessed                      General Comments      Exercises Total Joint Exercises Ankle Circles/Pumps: AROM;Both;15 reps;Supine Quad Sets: AROM;Both;10 reps;Supine Heel Slides: AAROM;Left;20 reps;Supine Hip ABduction/ADduction: AAROM;Left;15 reps;Supine   Assessment/Plan    PT Assessment Patient needs continued PT services  PT Problem List  Decreased strength;Decreased range of motion;Decreased activity tolerance;Decreased mobility;Decreased knowledge of use of DME;Obesity;Pain          PT Treatment Interventions DME instruction;Gait training;Stair training;Functional mobility training;Therapeutic activities;Therapeutic exercise;Patient/family education    PT Goals (Current goals can be found in the Care Plan section)  Acute  Rehab PT Goals Patient Stated Goal: Regain IND PT Goal Formulation: With patient Potential to Achieve Goals: Good    Frequency 7X/week   Barriers to discharge        Co-evaluation               End of Session Equipment Utilized During Treatment: Gait belt Activity Tolerance: Patient tolerated treatment well Patient left: in chair;with call bell/phone within reach;with family/visitor present Nurse Communication: Mobility status         Time: 1191-47820850-0928 PT Time Calculation (min) (ACUTE ONLY): 38 min   Charges:         PT G Codes:        Jaquanda Wickersham 06/16/2016, 12:13 PM

## 2016-06-16 NOTE — Progress Notes (Signed)
Physical Therapy Treatment Patient Details Name: Marco Kennedy MRN: [id number] DOB: 05/16/1951 Today's Date: 06/16/2016    History of Present Illness Pt s/p L THR and with hx of back surgery    PT Comments    Pt progressing well with mobility and eager for return home.  Reviewed therex, stairs and car transfers with pt and spouse.  Follow Up Recommendations  Home health PT     Equipment Recommendations  None recommended by PT    Recommendations for Other Services OT consult     Precautions / Restrictions Precautions Precautions: Fall Restrictions Weight Bearing Restrictions: No Other Position/Activity Restrictions: WBAT    Mobility  Bed Mobility Overal bed mobility: Needs Assistance Bed Mobility: Sit to Supine       Sit to supine: Min guard   General bed mobility comments: cues for sequence and use of R LE to self assist  Transfers Overall transfer level: Needs assistance Equipment used: Rolling walker (2 wheeled) Transfers: Sit to/from Stand Sit to Stand: Supervision         General transfer comment: cues for LE management and use of UEs to self assist  Ambulation/Gait Ambulation/Gait assistance: Min guard;Supervision Ambulation Distance (Feet): 444 Feet Assistive device: Rolling walker (2 wheeled) Gait Pattern/deviations: Step-to pattern;Step-through pattern;Decreased step length - right;Decreased step length - left;Shuffle;Trunk flexed Gait velocity: dec Gait velocity interpretation: Below normal speed for age/gender General Gait Details: cues for posture, position from RW and initial sequence   Stairs Stairs: Yes   Stair Management: One rail Right;Step to pattern;Forwards;With cane Number of Stairs: 8 General stair comments: cues for sequence and foot/cane placement  Wheelchair Mobility    Modified Rankin (Stroke Patients Only)       Balance                                    Cognition Arousal/Alertness:  Awake/alert Behavior During Therapy: WFL for tasks assessed/performed Overall Cognitive Status: Within Functional Limits for tasks assessed                      Exercises Total Joint Exercises Ankle Circles/Pumps: AROM;Both;15 reps;Supine Quad Sets: AROM;Both;10 reps;Supine Heel Slides: AAROM;Left;20 reps;Supine Hip ABduction/ADduction: AAROM;Left;15 reps;Supine    General Comments        Pertinent Vitals/Pain Pain Assessment: 0-10 Pain Score: 3  Pain Location: L hip Pain Descriptors / Indicators: Aching;Sore Pain Intervention(s): Limited activity within patient's tolerance;Monitored during session;Premedicated before session    Home Living                      Prior Function            PT Goals (current goals can now be found in the care plan section) Acute Rehab PT Goals Patient Stated Goal: Regain IND PT Goal Formulation: With patient Potential to Achieve Goals: Good Progress towards PT goals: Progressing toward goals    Frequency    7X/week      PT Plan Current plan remains appropriate    Co-evaluation             End of Session Equipment Utilized During Treatment: Gait belt Activity Tolerance: Patient tolerated treatment well Patient left: in bed;with call bell/phone within reach;with family/visitor present     Time: 5621-30861342-1432 PT Time Calculation (min) (ACUTE ONLY): 50 min  Charges:  $Gait Training: 8-22 mins $Therapeutic Exercise: 8-22 mins $Therapeutic Activity: 8-22  mins                    G Codes:      Jenaveve Fenstermaker 06/16/2016, 5:12 PM

## 2016-06-16 NOTE — Discharge Instructions (Signed)

## 2016-06-16 NOTE — Progress Notes (Signed)
     Subjective: 1 Day Post-Op Procedure(s) (LRB): LEFT TOTAL HIP ARTHROPLASTY ANTERIOR APPROACH (Left)   Patient reports pain as mild, pain controlled. No events throughout the night.  Ready to be discharged home.  Objective:   VITALS:   Vitals:   06/16/16 0149 06/16/16 0523  BP: 111/69 (!) 119/57  Pulse: 77 64  Resp: 18 18  Temp: 97.8 F (36.6 C) 98 F (36.7 C)    Dorsiflexion/Plantar flexion intact Incision: dressing C/D/I No cellulitis present Compartment soft  LABS  Recent Labs  06/16/16 0425  HGB 13.0  HCT 37.1*  WBC 17.2*  PLT 281     Recent Labs  06/16/16 0425  NA 138  K 4.2  BUN 18  CREATININE 0.63  GLUCOSE 165*     Assessment/Plan: 1 Day Post-Op Procedure(s) (LRB): LEFT TOTAL HIP ARTHROPLASTY ANTERIOR APPROACH (Left) Foley cath Advance diet Up with therapy D/C IV fluids Discharge home [facility]. Follow up with OLIN,Dashanae Longfield D in 2 weeks.  Contact information:  [facility] [address], [address] [place] [id number] [phone]    Morbid Obesity (BMI >40)  Estimated body mass index is 41.97 kg/m as calculated from the following:   Height as of this encounter: 5\' 7"  (1.702 m).   Weight as of this encounter: 121.6 kg (268 lb). Patient also counseled that weight may inhibit the healing process Patient counseled that losing weight will help with future health issues          Anastasio AuerbachMatthew S. Taiya Nutting   PAC  06/16/2016, 9:18 AM

## 2016-06-17 NOTE — Anesthesia Postprocedure Evaluation (Signed)
Anesthesia Post Note  Patient: Marco Kennedy  Procedure(s) Performed: Procedure(s) (LRB): LEFT TOTAL HIP ARTHROPLASTY ANTERIOR APPROACH (Left)  Patient location during evaluation: PACU Anesthesia Type: Spinal Level of consciousness: awake Pain management: satisfactory to patient Vital Signs Assessment: post-procedure vital signs reviewed and stable Respiratory status: spontaneous breathing Cardiovascular status: blood pressure returned to baseline Postop Assessment: no headache and spinal receding Anesthetic complications: no        Last Vitals:  Vitals:   06/16/16 0523 06/16/16 0947  BP: (!) 119/57 128/72  Pulse: 64 86  Resp: 18 17  Temp: 36.7 C 36.6 C    Last Pain:  Vitals:   06/16/16 1300  TempSrc:   PainSc: 1    Pain Goal: Patients Stated Pain Goal: 3 (06/16/16 0813)               Jiles GarterJACKSON,[name]

## 2016-06-24 NOTE — Discharge Summary (Signed)
Physician Discharge Summary  [name] MRN: [id number] DOB/AGE: [birth year] 65 y.o.  Admit date: 06/15/2016 Discharge date: 06/16/2016   Procedures:  Procedure(s) (LRB): LEFT TOTAL HIP ARTHROPLASTY ANTERIOR APPROACH (Left)  Attending Physician:  Dr. Durene RomansMatthew Olin   Admission Diagnoses:   Left hip primary OA / pain  Discharge Diagnoses:  Principal Problem:   S/P left THA, AA  Past Medical History:  Diagnosis Date  . Arthritis    arthritis-bilateral knees, left Hip.  . EKG abnormality    06-08-16- being evaluated by [place]ologist  in [place]  . Sleep apnea    Bipap use nightly 25/19 -3l/m oxygen  . [place] polyp [place] and colonscopy procedure    HPI:    Marco LightningKenneth E Kennedy, 65 y.o. male, has a history of pain and functional disability in the left hip(s) due to arthritis and patient has failed non-surgical conservative treatments for greater than 12 weeks to include NSAID's and/or analgesics and activity modification.  Onset of symptoms was gradual starting 2+ years ago with gradually worsening course since that time.The patient noted no past surgery on the left hip(s).  Patient currently rates pain in the left hip at 8 out of 10 with activity. Patient has night pain, worsening of pain with activity and weight bearing, trendelenberg gait, pain that interfers with activities of daily living and pain with passive range of motion. Patient has evidence of periarticular osteophytes and joint space narrowing by imaging studies. This condition presents safety issues increasing the risk of falls. There is no current active infection.  Risks, benefits and expectations were discussed with the patient.  Risks including but not limited to the risk of anesthesia, blood clots, nerve damage, blood vessel damage, failure of the prosthesis, infection and up to and including death.  Patient understand the risks, benefits and  expectations and wishes to proceed with surgery.  PCP: [name],[name], MD   Discharged Condition: good  Hospital Course:  Patient underwent the above stated procedure on 06/15/2016. Patient tolerated the procedure well and brought to the recovery room in good condition and subsequently to the floor.  POD #1 BP: 119/57 ; Pulse: 64 ; Temp: 98 F (36.7 C) ; Resp: 18 Patient reports pain as mild, pain controlled. No events throughout the night.  Ready to be discharged home. Dorsiflexion/plantar flexion intact, incision: dressing C/D/I, no cellulitis present and compartment soft.   LABS  Basename    HGB     13.0  HCT     37.1    Discharge Exam: General appearance: alert, cooperative and no distress Extremities: Homans sign is negative, no sign of DVT, no edema, redness or tenderness in the calves or thighs and no ulcers, gangrene or trophic changes  Disposition: Home with follow up in 2 weeks   Follow-up Information    Shelda PalLIN,Annelle Behrendt D, MD. Schedule an appointment as soon as possible for a visit in 2 week(s).   Specialty:  Orthopedic Surgery Contact information: [address] [place] [id number] [phone]        amedysis Follow up.   Why:  home health physical therapy Contact information: [phone]          Discharge Instructions    Call MD / Call 911    Complete by:  As directed    If you experience chest pain or shortness of breath, CALL 911 and be transported to the hospital emergency room.  If  you develope a fever above 101 F, pus (white drainage) or increased drainage or redness at the wound, or calf pain, call your surgeon's office.   Change dressing    Complete by:  As directed    Maintain surgical dressing until follow up in the clinic. If the edges start to pull up, may reinforce with tape. If the dressing is no longer working, may remove and cover with gauze and tape, but must keep the area dry and clean.  Call with any questions  or concerns.   Constipation Prevention    Complete by:  As directed    Drink plenty of fluids.  Prune juice may be helpful.  You may use a stool softener, such as Colace (over the counter) 100 mg twice a day.  Use MiraLax (over the counter) for constipation as needed.   Diet - low sodium heart healthy    Complete by:  As directed    Discharge instructions    Complete by:  As directed    Maintain surgical dressing until follow up in the clinic. If the edges start to pull up, may reinforce with tape. If the dressing is no longer working, may remove and cover with gauze and tape, but must keep the area dry and clean.  [facility]. Call with any questions or concerns.   Increase activity slowly as tolerated    Complete by:  As directed    Weight bearing as tolerated with assist device (walker, cane, etc) as directed, use it as long as suggested by your surgeon or therapist, typically at least 4-6 weeks.   TED hose    Complete by:  As directed    Use stockings (TED hose) for 2 weeks on both leg(s).  You may remove them at night for sleeping.      Allergies as of 06/16/2016   No Known Allergies     Medication List    STOP taking these medications   acetaminophen 500 MG tablet Commonly known as:  TYLENOL   diclofenac 75 MG EC tablet Commonly known as:  VOLTAREN   traMADol 50 MG tablet Commonly known as:  ULTRAM     TAKE these medications   aspirin 81 MG chewable tablet Chew 1 tablet (81 mg total) by mouth 2 (two) times daily. Take for 4 weeks.   docusate sodium 100 MG capsule Commonly known as:  COLACE Take 1 capsule (100 mg total) by mouth 2 (two) times daily.   ferrous sulfate 325 (65 FE) MG tablet Take 1 tablet (325 mg total) by mouth 3 (three) times daily after meals.   HYDROcodone-acetaminophen 7.5-325 MG tablet Commonly known as:  NORCO Take 1-2 tablets by mouth every 4 (four) hours as needed for moderate pain.   methocarbamol 500  MG tablet Commonly known as:  ROBAXIN Take 1 tablet (500 mg total) by mouth every 6 (six) hours as needed for muscle spasms.   OSTEO BI-FLEX REGULAR STRENGTH PO Take 2 capsules by mouth daily.   polyethylene glycol packet Commonly known as:  MIRALAX / GLYCOLAX Take 17 g by mouth 2 (two) times daily.        Signed: Anastasio AuerbachMatthew S. Caitlyne Ingham   PA-C  06/24/2016, 9:43 AM

## 2019-12-04 ENCOUNTER — Encounter: Payer: BLUE CROSS/BLUE SHIELD | Admitting: Vascular Surgery

## 2019-12-25 ENCOUNTER — Other Ambulatory Visit: Payer: Self-pay

## 2019-12-25 ENCOUNTER — Encounter: Payer: Self-pay | Admitting: Vascular Surgery

## 2019-12-25 ENCOUNTER — Ambulatory Visit (INDEPENDENT_AMBULATORY_CARE_PROVIDER_SITE_OTHER): Payer: BLUE CROSS/BLUE SHIELD | Admitting: Vascular Surgery

## 2019-12-25 DIAGNOSIS — M5442 Lumbago with sciatica, left side: Secondary | ICD-10-CM | POA: Diagnosis not present

## 2019-12-25 DIAGNOSIS — G8929 Other chronic pain: Secondary | ICD-10-CM

## 2019-12-25 DIAGNOSIS — M549 Dorsalgia, unspecified: Secondary | ICD-10-CM | POA: Insufficient documentation

## 2019-12-25 NOTE — Progress Notes (Signed)
Patient name: Marco Kennedy MRN: [id number] DOB: [birth year] Sex: male  REASON FOR CONSULT: Evaluate for L4-L5 and L5-S1 ALIF  HPI: Marco Kennedy is a 69 y.o. male, with history of sleep apnea the presents for evaluation of two level ALIF at L4-L5 and L5-S1 with Dr. Venetia Maxon.  Patient reports several years of chronic lower back pain with some radiculopathy down the left leg.  He has had failed conservative management.  States he has a very difficult time riding in a car.  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  Currently walking with a walker.  He reports his only abdominal surgery is a left inguinal hernia repair with a left groin incision that was done years ago.  He is here with his wife and lives in [place].  States he is following up with his pulmonologist for his sleep apnea prior to surgery also seeing cardiologist for some EKG abnormality from years ago.  Past Medical History:  Diagnosis Date  . Arthritis    arthritis-bilateral knees, left Hip.  . EKG abnormality    06-08-16- being evaluated by cardiologist  in [place]  . Sleep apnea    Bipap use nightly 25/19 -3l/m oxygen  . Transfusion history    [place] ,[place] after bleeding post colon polyp removal and colonscopy procedure    Past Surgical History:  Procedure Laterality Date  . BACK SURGERY     lumbar disc surgery  . COLONOSCOPY W/ POLYPECTOMY    . HERNIA REPAIR Left    left inguinal hernia repair  . KNEE ARTHROSCOPY Right    x3 -scope for meniscus  . TOTAL HIP ARTHROPLASTY Left 06/15/2016   Procedure: LEFT TOTAL HIP ARTHROPLASTY ANTERIOR APPROACH;  Surgeon: [name], MD;  Location: [facility];  Service: Orthopedics;  Laterality: Left;    Family History  Problem Relation Age of Onset  . Hypertension Mother   . Arthritis Mother     SOCIAL HISTORY: Social History   Socioeconomic History  . Marital status: Married    Spouse name: Not on file  . Number of children: Not on file  .  Years of education: Not on file  . Highest education level: Not on file  Occupational History  . Not on file  Tobacco Use  . Smoking status: Never Smoker  . Smokeless tobacco: Never Used  Substance and Sexual Activity  . Alcohol use: Yes    Comment: rare- social   . Drug use: No  . Sexual activity: Yes  Other Topics Concern  . Not on file  Social History Narrative  . Not on file   Social Determinants of Health   Financial Resource Strain:   . Difficulty of Paying Living Expenses:   Food Insecurity:   . Worried About Programme researcher, broadcasting/film/video in the Last Year:   . Barista in the Last Year:   Transportation Needs:   . Freight forwarder (Medical):   Marland Kitchen Lack of Transportation (Non-Medical):   Physical Activity:   . Days of Exercise per Week:   . Minutes of Exercise per Session:   Stress:   . Feeling of Stress :   Social Connections:   . Frequency of Communication with Friends and Family:   . Frequency of Social Gatherings with Friends and Family:   . Attends Religious Services:   . Active Member of Clubs or Organizations:   . Attends Banker Meetings:   Marland Kitchen Marital Status:  Intimate Partner Violence:   . Fear of Current or Ex-Partner:   . Emotionally Abused:   Marland Kitchen Physically Abused:   . Sexually Abused:     No Known Allergies  Current Outpatient Medications  Medication Sig Dispense Refill  . acetaminophen (TYLENOL) 500 MG tablet Take 500 mg by mouth every 6 (six) hours as needed.    . docusate sodium (COLACE) 100 MG capsule Take 1 capsule (100 mg total) by mouth 2 (two) times daily. 10 capsule 0  . ferrous sulfate 325 (65 FE) MG tablet Take 1 tablet (325 mg total) by mouth 3 (three) times daily after meals.  3  . Glucosamine-Chondroitin (OSTEO BI-FLEX REGULAR STRENGTH PO) Take 2 capsules by mouth daily. (Patient not taking: Reported on 12/25/2019)    . HYDROcodone-acetaminophen (NORCO) 7.5-325 MG tablet Take 1-2 tablets by mouth every 4 (four) hours as  needed for moderate pain. (Patient not taking: Reported on 12/25/2019) 60 tablet 0  . methocarbamol (ROBAXIN) 500 MG tablet Take 1 tablet (500 mg total) by mouth every 6 (six) hours as needed for muscle spasms. (Patient not taking: Reported on 12/25/2019) 40 tablet 0  . polyethylene glycol (MIRALAX / GLYCOLAX) packet Take 17 g by mouth 2 (two) times daily. (Patient not taking: Reported on 12/25/2019) 14 each 0   No current facility-administered medications for this visit.    REVIEW OF SYSTEMS:  [X]  denotes positive finding, [ ]  denotes negative finding Cardiac  Comments:  Chest pain or chest pressure:    Shortness of breath upon exertion:    Short of breath when lying flat:    Irregular heart rhythm:        Vascular    Pain in calf, thigh, or hip brought on by ambulation:    Pain in feet at night that wakes you up from your sleep:     Blood clot in your veins:    Leg swelling:         Pulmonary    Oxygen at home:    Productive cough:     Wheezing:         Neurologic    Sudden weakness in arms or legs:     Sudden numbness in arms or legs:     Sudden onset of difficulty speaking or slurred speech:    Temporary loss of vision in one eye:     Problems with dizziness:         Gastrointestinal    Blood in stool:     Vomited blood:         Genitourinary    Burning when urinating:     Blood in urine:        Psychiatric    Major depression:         Hematologic    Bleeding problems:    Problems with blood clotting too easily:        Skin    Rashes or ulcers:        Constitutional    Fever or chills:      PHYSICAL EXAM: Vitals:   12/25/19 1103  BP: (!) 137/96  Pulse: (!) 55  Resp: 16  Temp: (!) 97.2 F (36.2 C)  TempSrc: Temporal  SpO2: 96%  Weight: 240 lb (108.9 kg)  Height: 5' 6.5" (1.689 m)    GENERAL: The patient is a well-nourished male, in no acute distress. The vital signs are documented above. CARDIAC: There is a regular rate and rhythm.  VASCULAR:    Palpable  femoral pulses bilaterally Left DP and PT palpable PULMONARY: There is good air exchange bilaterally without wheezing or rales. ABDOMEN: Soft and non-tender with normal pitched bowel sounds.  Left lower groin incision from previous hernia repair MUSCULOSKELETAL: There are no major deformities or cyanosis. NEUROLOGIC: No focal weakness or paresthesias are detected.   DATA:   I have independently reviewed his MR lumbar spine from 11/2019 and his aortic bifurcation appears to be at L4 with iliac vein bifurcation just below this and appropriate planes posterior for mobilization.  Assessment/Plan:  69 year old male with chronic lower back pain that presents for pre-op evaluation of L4-L5 and L5-S1 ALIF.  I discussed steps of surgery including left paramedian incision over the left rectus muscle with mobilization of the left rectus and entering the retroperitoneum and mobilizing the peritoneum/intestines and left ureter across midline and then mobilization of the left iliac artery and vein to expose the disc space appropriately.  We talked about injury to any of the above structures and other risks as well.  I reviewed his MRI imaging and I think he would be a good candidate for anterior approach.  Look forward to assisting Dr. Venetia Maxon.  I discussed with our office and they will reach out to Dr. Fredrich Birks office to select a date in the near future.   [name], MD [facility] Office: [phone]

## 2020-01-21 ENCOUNTER — Other Ambulatory Visit: Payer: Self-pay | Admitting: Neurosurgery

## 2020-03-05 ENCOUNTER — Other Ambulatory Visit: Payer: Self-pay | Admitting: Neurosurgery

## 2020-03-14 ENCOUNTER — Inpatient Hospital Stay: Admit: 2020-03-14 | Payer: Medicare Other | Admitting: Neurosurgery

## 2020-03-14 SURGERY — ANTERIOR LUMBAR FUSION 2 LEVELS
Anesthesia: General

## 2020-03-17 ENCOUNTER — Inpatient Hospital Stay: Admit: 2020-03-17 | Payer: Medicare Other | Admitting: Neurosurgery

## 2020-03-17 SURGERY — POSTERIOR LUMBAR FUSION 4 LEVEL
Anesthesia: General | Site: Back

## 2020-04-01 NOTE — H&P (Signed)
Patient ID:   [id number] Patient: Marco Kennedy  Date of Birth: 1950-08-30 Visit Type: Office Visit   Date: 12/19/2019 11:00 AM Provider: Danae Orleans. Venetia Maxon MD   This 69 year old male presents for MRI Review.  HISTORY OF PRESENT ILLNESS: 1.  MRI Review  Pt returns for review of MRI and discussion of surgical plan  I spent greater than 45 minutes in direct communication with this patient and his wife and answered their questions and discussed treatment options.  The patient is continuing to have significant limiting back pain and currently grades it as 7/10 in severity.  We reviewed his MRI of the lumbar spine which shows progressive degeneration, particularly at the L3-4 level, but affecting each of the levels the lumbar spine.  We also reviewed his radiographs which show sagittal imbalance as previously identified on earlier imaging  The patient has done a good job at losing weight.  He went from 305 lb down to 236.4 lb today.  We also talked about physical conditioning and he has been doing the water therapy per my recommendation.  He does say that he is no better in terms of his overall pain control and is very frustrated with his limitations.  He does want to go ahead with surgery.  We discussed the exact surgical procedure in great detail and will on over models and talked about treatment options.      Medical/Surgical/Interim History Reviewed, no change.  Last detailed document date:06/11/2019.     PAST MEDICAL HISTORY, SURGICAL HISTORY, FAMILY HISTORY, SOCIAL HISTORY AND REVIEW OF SYSTEMS I have reviewed the patient's past medical, surgical, family and social history as well as the comprehensive review of systems as included on the [facility] history form dated 07/31/2019, which I have signed.  Family History: Reviewed, no changes.  Last detailed document date:06/11/2019.   Social History: Reviewed, no changes. Last detailed document  date: 06/11/2019.    MEDICATIONS: (added, continued or stopped this visit) Started Medication Directions Instruction Stopped  diclofenac sodium 75 mg tablet,delayed release take 1 tablet by oral route 2 times every day      ALLERGIES: Ingredient Reaction Medication Name Comment NO KNOWN ALLERGIES    No known allergies. Reviewed, no changes.    PHYSICAL EXAM:  Vitals Date Temp F BP Pulse Ht In Wt Lb BMI BSA Pain Score 12/19/2019  139/78 53 67 236.4 37.03  7/10     IMPRESSION:  The patient is tired of having persistent back and lower extremity pain.  He wants to go ahead with surgery.  This will consist of anterior lumbar interbody fusion at the L4-5 and L5-S1 levels with left-sided XL IF at L2-3 and L3-4 levels.  This will be 1st stage procedure.  Second stage will be T10 through pelvis fixation along with probable osteotomies depending on the degree of correction we have obtained with standing radiographs after the 1st stage.  This will be done with [facility] intraoperative CT scanning.  PLAN: The patient wishes to proceed with surgery.  He is going to require pulmonary clearance with Dr. Egbert Garibaldi in [place] and cardiac clearance with Dr. Molly Maduro in [place].  He may also see Dr. Teofilo Pod for cardiac clearance in [place].  He was given a prescription for TLSO brace.  He will require vascular surgery preoperative appointment.  He is going to continue to work on weight control and physical conditioning with water therapy prior to surgery  Orders: Diagnostic Procedures: Assessment Procedure M54.16 Scoliosis- AP/Lat Instruction(s)/Education:  Assessment Instruction R03.0 Lifestyle education [phone] Dietary management education, guidance, and counseling Miscellaneous: Assessment  M41.20 TLSO Brace (Drawstring)  Completed Orders (this encounter) Order Details Reason Side Interpretation Result Initial Treatment Date Region Lifestyle  education Patient will monitor and contact primary care physician if needed.       Dietary management education, guidance, and counseling Encouraged patient to eat well balanced diet.        Assessment/Plan  # Detail Type Description  1. Assessment Scoliosis (and kyphoscoliosis), idiopathic (M41.20).  Plan Orders TLSO Brace (Drawstring).     2. 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     3. Assessment Lumbar radiculopathy (M54.16).     4. Assessment Degenerative lumbar spinal stenosis (M48.061).     5. 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     6. Assessment Elevated blood-pressure reading, w/o diagnosis of htn (R03.0).     7. Assessment Body mass index (BMI) 37.0-37.9, adult (Z68.37).  Plan Orders Today's instructions / counseling include(s) Dietary management education, guidance, and counseling. Clinical information/comments: Encouraged patient to eat well balanced diet.       Pain Management Plan Pain Scale: 7/10. Method: Numeric Pain Intensity Scale. Location: back. Onset: 06/11/2019. Duration: varies. Quality: discomforting. Pain management follow-up plan of care: Patient will continue medication management..              Provider:  Danae Orleans. Venetia Maxon MD  12/22/2019 11:50 AM    Dictation edited by: Danae Orleans. Venetia Maxon    CC Providers: Donnetta Hutching [address] [place],  [place]  [address]   [name] MD  [address] [place], [place] [address]               Electronically signed by Danae Orleans. Venetia Maxon MD on 12/22/2019 11:50 AM Patient ID:   [id number] Patient: Marco Kennedy  Date of Birth: 1951-05-28 Visit Type: Office Visit   Date: 06/11/2019 09:15 AM Provider: Danae Orleans. Venetia Maxon MD   This 69 year old male presents for back pain.  HISTORY OF PRESENT ILLNESS: 1.  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  Patient recalls no injury, noting increasing pain since March, low back, right buttock, right groin, right hamstring.  He is unable to stand erect without significant pain.  Recently, he also notes pain when sitting.  Dr. Jillyn Hidden referred him to Dr. Ethelene Hal for injections.  Diclofenac 75 mg b.i.d. For knee pain Tylenol OTC daily Norco prescribed by Dr. Jillyn Hidden, not taken  ESI x2 offered no relief; 3rd injection denied by insurance Physical therapy only helped range of motion and flexibility; no pain relief  History:  Arthritis Surgical history:  Left THR 2017, lumbar disc 2009, right knee scope years ago, hernia repair 1976  MRI 10/04/2018 uploaded canopy.  X-ray on canopy  The patient complains right groin and thigh pain.  He grades it as 8/10 with standing and says that it is 2-3/10 sitting.  Has been using a rolling walker.  He complains of pain in right buttock right groin be upper thigh.  Lumbar radiographs show L4-5 spondylolisthesis with levoconvex scoliosis and right foraminal stenosis at L2-3 and L3-4 levels.  There appears to be right L2 nerve compression in the foramen.  At L4-5 there is grade 1 anterolisthesis of 9 mm neutral lateral radiograph, increasing to 10 mm on flexion and decreasing to 9 mm extension.  PAST MEDICAL/SURGICAL HISTORY:   (Detailed)   Disease/disorder Onset Date Management Date Comments   Hernia repair     Hip replacement     Knee replacement      PAST MEDICAL HISTORY, SURGICAL HISTORY, FAMILY HISTORY, SOCIAL HISTORY AND REVIEW OF SYSTEMS I have reviewed the patient's past medical, surgical, family and social history as well as the comprehensive review of systems as included on the [facility] history form dated 05/30/2019, which I have signed.  Family History:  (Detailed)   Social History:  (Detailed) Tobacco use  reviewed. Preferred language is [place].   Tobacco use status: Current non-smoker. Smoking status: Never smoker.  SMOKING STATUS Type Smoking Status Usage Per Day Years Used Total Pack Years  Never smoker         MEDICATIONS: (added, continued or stopped this visit) Started Medication Directions Instruction Stopped  diclofenac sodium 75 mg tablet,delayed release take 1 tablet by oral route 2 times every day      ALLERGIES: Ingredient Reaction Medication Name Comment NO KNOWN ALLERGIES    No known allergies. Reviewed, no changes.   REVIEW OF SYSTEMS  See scanned patient registration form, dated 05/30/2019, signed and dated on 06/11/2019  Review of Systems Details System Neg/Pos Details Constitutional Negative Chills, Fatigue, Fever, Malaise, Night sweats, Weight gain and Weight loss. ENMT Negative Ear drainage, Hearing loss, Nasal drainage, Otalgia, Sinus pressure and Sore throat. Eyes Negative Eye discharge, Eye pain and Vision changes. Respiratory Negative Chronic cough, Cough, Dyspnea, Known TB exposure and Wheezing. Cardio Positive Edema. GI Negative Abdominal pain, Blood in stool, Change in stool pattern, Constipation, Decreased appetite, Diarrhea, Heartburn, Nausea and Vomiting. GU Negative Dribbling, Dysuria, Erectile dysfunction, Hematuria, Polyuria (Genitourinary), Slow stream, Urinary frequency, Urinary incontinence and Urinary retention. Endocrine Negative Cold intolerance, Heat intolerance, Polydipsia and Polyphagia. Neuro Negative Dizziness, Extremity weakness, Gait disturbance, Headache, Memory impairment, Numbness in extremity, Seizures and Tremors. Psych Negative Anxiety, Depression and Insomnia. Integumentary Negative Brittle hair, Brittle nails, Change in shape/size of mole(s), Hair loss, Hirsutism, Hives, Pruritus, Rash and Skin lesion. MS Positive Back pain, RLE pain. Hema/Lymph Negative Easy bleeding,  Easy bruising and Lymphadenopathy. Allergic/Immuno Negative Contact allergy, Environmental allergies, Food allergies and Seasonal allergies. Reproductive Negative Penile discharge and Sexual dysfunction.  PHYSICAL EXAM:  Vitals Date Temp F BP Pulse Ht In Wt Lb BMI BSA Pain Score 06/11/2019 97.7 174/79 59 67 255.2 39.97  3/10   PHYSICAL EXAM Details General Level of Distress: no acute distress Overall Appearance: normal  Head and Face  Right Left  Fundoscopic Exam:  normal normal    Cardiovascular Cardiac: regular rate and rhythm without murmur  Right Left  Carotid Pulses: normal normal  Respiratory Lungs: clear to auscultation  Neurological Orientation: normal Recent and Remote Memory: normal Attention Span and Concentration:   normal Language: normal Fund of Knowledge: normal  Right Left Sensation: normal normal Upper Extremity Coordination: normal normal  Lower Extremity Coordination: normal normal  Musculoskeletal Gait and Station: normal  Right Left Upper Extremity Muscle Strength: normal normal Lower Extremity Muscle Strength: normal normal Upper Extremity Muscle Tone:  normal normal Lower Extremity Muscle Tone: normal normal   Motor Strength Upper and lower extremity motor strength was tested in the clinically pertinent muscles. Any abnormal findings will be noted below.   Right Left Hip Flexor: 4/5    Deep Tendon Reflexes  Right Left Biceps: normal normal Triceps: normal normal Brachioradialis: normal normal Patellar: normal normal Achilles: normal normal  Sensory Sensation  was tested at L1 to S1.   Cranial Nerves II. Optic Nerve/Visual Fields: normal III. Oculomotor: normal IV. Trochlear: normal V. Trigeminal: normal VI. Abducens: normal VII. Facial: normal VIII. Acoustic/Vestibular: normal IX. Glossopharyngeal: normal X. Vagus: normal XI. Spinal Accessory: normal XII. Hypoglossal: normal  Motor and other  Tests Lhermittes: negative Rhomberg: negative Pronator drift: absent     Right Left Hoffman's: normal normal Clonus: normal normal Babinski: normal normal SLR: positive at 45 degrees negative Patrick's Pearlean Brownie): negative negative Toe Walk: normal normal Toe Lift: normal normal Heel Walk: normal normal SI Joint: nontender nontender   Additional Findings:  Patient is able to bend to within 4 in of the floor with his upper extremities outstretched.  Positive reversed straight leg raise on the right    IMPRESSION:  The patient has a L2 radiculopathy with scoliosis and spondylolisthesis of L4 on L5.  I have recommended a right L2 selective nerve block see this help with his discomfort.  I have also recommended obtaining scoliosis radiographs.  PLAN: Patient will follow-up with me after injection and with scoliosis radiographs  Orders: Diagnostic Procedures: Assessment Procedure M54.16 Lumbar Spine- AP/Lat/Flex/Ex Q68.341 Scoliosis- AP/Lat Instruction(s)/Education: Assessment Instruction R03.0 Lifestyle education Z68.39 Dietary management education, guidance, and counseling  Completed Orders (this encounter) Order Details Reason Side Interpretation Result Initial Treatment Date Region Lifestyle education Patient will monitor and contact primary care physician if needed.       Dietary management education, guidance, and counseling Encouraged patient to eat well balanced diet.       Lumbar Spine- AP/Lat/Flex/Ex      06/11/2019 All Levels to All Levels Scoliosis- AP/Lat      06/11/2019 All Levels to All Levels  Assessment/Plan  # Detail Type Description  1. Assessment Degenerative lumbar spinal stenosis (M48.061).     2. Assessment Idiopathic scoliosis of lumbar region (M41.26).     3. 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     4. Assessment Lumbar radiculopathy (M54.16).      5. Assessment Scoliosis concern (D62.229).     6. Assessment Elevated blood-pressure reading, w/o diagnosis of htn (R03.0).     7. Assessment Body mass index (BMI) 39.0-39.9, adult (Z68.39).  Plan Orders Today's instructions / counseling include(s) Dietary management education, guidance, and counseling. Clinical information/comments: Encouraged patient to eat well balanced diet.       Pain Management Plan Pain Scale: 3/10. Method: Numeric Pain Intensity Scale. Location: back. Onset: 06/11/2019. Duration: varies. Quality: discomforting. Pain management follow-up plan of care: Patient will continue medication management..              Provider:  Danae Orleans. Venetia Maxon MD  06/21/2019 12:10 PM    Dictation edited by: Danae Orleans. Venetia Maxon    CC Providers: [name] MD  [address] [place], [place] [address]               Electronically signed by Danae Orleans. Venetia Maxon MD on 06/21/2019 12:10 PM

## 2020-04-14 NOTE — Progress Notes (Signed)
[id number] [place], [place] - [address] [place] [address] Phone: [phone] Fax: [phone]      Your procedure is scheduled on October 22  Report to [facility] Main Entrance "A" at 0530 A.M., and check in at the Admitting office.  Call this number if you have problems the morning of surgery:  [phone]  Call [phone] if you have any questions prior to your surgery date Monday-Friday 8am-4pm    Remember:  Do not eat or drink after midnight the night before your surgery     Take these medicines the morning of surgery with A SIP OF WATER  acetaminophen (TYLENOL) if needed   As of today, STOP taking any Aspirin (unless otherwise instructed by your surgeon) Aleve, Naproxen, Ibuprofen, Motrin, Advil, Goody's, BC's, all herbal medications, fish oil, and all vitamins. diclofenac (VOLTAREN)                      Do not wear jewelry            Do not wear lotions, powders, colognes, or deodorant.            Men may shave face and neck.            Do not bring valuables to the hospital.            [facility] is not responsible for any belongings or valuables.  Do NOT Smoke (Tobacco/Vaping) or drink Alcohol 24 hours prior to your procedure If you use a CPAP at night, you may bring all equipment for your overnight stay.   Contacts, glasses, dentures or bridgework may not be worn into surgery.      For patients admitted to the hospital, discharge time will be determined by your treatment team.   Patients discharged the day of surgery will not be allowed to drive home, and someone needs to stay with them for 24 hours.    Special instructions:   [place]- Preparing For Surgery  Before surgery, you can play an important role. Because skin is not sterile, your skin needs to be as free of germs as possible. You can reduce the number of germs on your skin by washing with CHG (chlorahexidine gluconate) Soap before surgery.  CHG is an  antiseptic cleaner which kills germs and bonds with the skin to continue killing germs even after washing.    Oral Hygiene is also important to reduce your risk of infection.  Remember - BRUSH YOUR TEETH THE MORNING OF SURGERY WITH YOUR REGULAR TOOTHPASTE  Please do not use if you have an allergy to CHG or antibacterial soaps. If your skin becomes reddened/irritated stop using the CHG.  Do not shave (including legs and underarms) for at least 48 hours prior to first CHG shower. It is OK to shave your face.  Please follow these instructions carefully.   1. Shower the NIGHT BEFORE SURGERY and the MORNING OF SURGERY with CHG Soap.   2. If you chose to wash your hair, wash your hair first as usual with your normal shampoo.  3. After you shampoo, rinse your hair and body thoroughly to remove the shampoo.  4. Use CHG as you would any other liquid soap. You can apply CHG directly to the skin and wash gently with a scrungie or a clean washcloth.   5. Apply the CHG Soap to your body ONLY FROM THE NECK DOWN.  Do not use on open  wounds or open sores. Avoid contact with your eyes, ears, mouth and genitals (private parts). Wash Face and genitals (private parts)  with your normal soap.   6. Wash thoroughly, paying special attention to the area where your surgery will be performed.  7. Thoroughly rinse your body with warm water from the neck down.  8. DO NOT shower/wash with your normal soap after using and rinsing off the CHG Soap.  9. Pat yourself dry with a CLEAN TOWEL.  10. Wear CLEAN PAJAMAS to bed the night before surgery  11. Place CLEAN SHEETS on your bed the night of your first shower and DO NOT SLEEP WITH PETS.   Day of Surgery: Wear Clean/Comfortable clothing the morning of surgery Do not apply any deodorants/lotions.   Remember to brush your teeth WITH YOUR REGULAR TOOTHPASTE.   Please read over the following fact sheets that you were given.

## 2020-04-15 ENCOUNTER — Other Ambulatory Visit (HOSPITAL_COMMUNITY)
Admission: RE | Admit: 2020-04-15 | Discharge: 2020-04-15 | Disposition: A | Discharge status: Home / Self Care | Payer: BC Managed Care – PPO | Source: Ambulatory Visit | Attending: Neurosurgery | Admitting: Neurosurgery

## 2020-04-15 ENCOUNTER — Encounter (HOSPITAL_COMMUNITY)
Admission: RE | Admit: 2020-04-15 | Discharge: 2020-04-15 | Disposition: A | Discharge status: Home / Self Care | Payer: BC Managed Care – PPO | Source: Ambulatory Visit | Attending: Neurosurgery | Admitting: Neurosurgery

## 2020-04-15 ENCOUNTER — Encounter (HOSPITAL_COMMUNITY): Payer: Self-pay

## 2020-04-15 ENCOUNTER — Other Ambulatory Visit: Payer: Self-pay

## 2020-04-15 DIAGNOSIS — Z01812 Encounter for preprocedural laboratory examination: Secondary | ICD-10-CM | POA: Insufficient documentation

## 2020-04-15 DIAGNOSIS — R9431 Abnormal electrocardiogram [ECG] [EKG]: Secondary | ICD-10-CM | POA: Insufficient documentation

## 2020-04-15 DIAGNOSIS — Z20822 Contact with and (suspected) exposure to covid-19: Secondary | ICD-10-CM | POA: Insufficient documentation

## 2020-04-15 LAB — BASIC METABOLIC PANEL
Anion gap: 11 (ref 5–15)
BUN: 17 mg/dL (ref 8–23)
CO2: 25 mmol/L (ref 22–32)
Calcium: 9.2 mg/dL (ref 8.9–10.3)
Chloride: 104 mmol/L (ref 98–111)
Creatinine, Ser: 0.83 mg/dL (ref 0.61–1.24)
GFR, Estimated: >60 mL/min (ref >60)
Glucose, Bld: 112 mg/dL — ABNORMAL HIGH (ref 70–99)
Potassium: 3.9 mmol/L (ref 3.5–5.1)
Sodium: 140 mmol/L (ref 135–145)

## 2020-04-15 LAB — CBC
HCT: 47.5 % (ref 39.0–52.0)
Hemoglobin: 15.6 g/dL (ref 13.0–17.0)
MCH: 30.6 pg (ref 26.0–34.0)
MCHC: 32.8 g/dL (ref 30.0–36.0)
MCV: 93.1 fL (ref 80.0–100.0)
Platelets: 280 10*3/uL (ref 150–400)
RBC: 5.1 MIL/uL (ref 4.22–5.81)
RDW: 12.8 % (ref 11.5–15.5)
WBC: 7.8 10*3/uL (ref 4.0–10.5)
nRBC: 0 % (ref 0.0–0.2)

## 2020-04-15 LAB — SURGICAL PCR SCREEN
MRSA, PCR: NEGATIVE
Staphylococcus aureus: POSITIVE — AB

## 2020-04-15 LAB — TYPE AND SCREEN
ABO/RH(D): A POS
Antibody Screen: NEGATIVE

## 2020-04-15 LAB — SARS CORONAVIRUS 2 (TAT 6-24 HRS): SARS Coronavirus 2: NEGATIVE

## 2020-04-15 NOTE — Progress Notes (Signed)
PCP - Carlena Bjornstad [facility] Cardiologist - [facility] requesting records  Chest x-ray - n/a EKG - 2021 Stress Test - 2017 ECHO - 2021 Cardiac Cath - 2017  Sleep Study - > 15-20 years CPAP - Bipap at night - 25/19 plus 3 LPM oxygen heated humidification  COVID TEST- 04/15/20   Anesthesia review: yes records requested  Patient denies shortness of breath, fever, cough and chest pain at PAT appointment   All instructions explained to the patient, with a verbal understanding of the material. Patient agrees to go over the instructions while at home for a better understanding. Patient also instructed to self quarantine after being tested for COVID-19. The opportunity to ask questions was provided.

## 2020-04-16 NOTE — Anesthesia Preprocedure Evaluation (Addendum)
Anesthesia Evaluation  Patient identified by MRN, date of birth, ID band Patient awake    Reviewed: Allergy & Precautions, NPO status , Patient's Chart, lab work & pertinent test results  Airway Mallampati: II  TM Distance: >3 FB Neck ROM: Full    Dental  (+) Teeth Intact, Dental Advisory Given   Pulmonary    breath sounds clear to auscultation       Cardiovascular  Rhythm:Regular Rate:Normal     Neuro/Psych    GI/Hepatic   Endo/Other    Renal/GU      Musculoskeletal   Abdominal   Peds  Hematology   Anesthesia Other Findings   Reproductive/Obstetrics                             Anesthesia Physical Anesthesia Plan  ASA: III  Anesthesia Plan: General   Post-op Pain Management:    Induction: Intravenous  PONV Risk Score and Plan: Ondansetron and Dexamethasone  Airway Management Planned: Oral ETT  Additional Equipment:   Intra-op Plan:   Post-operative Plan: Extubation in OR  Informed Consent: I have reviewed the patients History and Physical, chart, labs and discussed the procedure including the risks, benefits and alternatives for the proposed anesthesia with the patient or authorized representative who has indicated his/her understanding and acceptance.     Dental advisory given  Plan Discussed with: CRNA and Anesthesiologist  Anesthesia Plan Comments: (PAT note by Antionette Poles, PA-C: Patient follows with cardiologist at [facility] heart and vascular in [facility] for history of OSA on BiPAP with supplemental oxygen at night and abnormal EKG.  He was seen 02/14/2020 for preop clearance.  Per note, "69 year old man with normal coronary arteries seen on cardiac catheterization performed December 2017 with EF 60-65%, no mitral regurgitation, and no gradient across the aortic valve on the study who presents to cardiology today for preoperative cardiac evaluation prior to back  surgery by Dr. Venetia Maxon in [place], [place].  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  This patient has no symptoms of angina and modestly good exercise tolerance.  We will check an echocardiogram to rule out structural heart disease and valvular heart disease.  We will make follow-up recommendations after the above cardiac testing is completed."  Patient subsequently underwent echocardiogram with benign findings and was cleared per letter dated 03/06/2020 stating, "this is to inform you that the above-mentioned patient was evaluated by me and has been cleared for his upcoming surgery.  I feel this patient is low risk from a cardiac standpoint to proceed with back surgery.  Please do not hesitate to call me if I can be of further assistance."  Copy on chart.  OSA on BiPAP with settings 25/19 with 3 L/min oxygen heated humidification  Preop labs reviewed, unremarkable. . EKG 02/13/2020 (copy on chart): Sinus bradycardia.  Rate 54.  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  TTE 02/27/2020 (copy on chart): Impression: 1.  Normal LV size with normal systolic function.  The ejection fraction is 60 to 65%.  Grade 2 diastolic dysfunction.  There is normal left ventricular wall thickness. 2.  Normal right ventricular size with normal function. 3.  There is a normal aortic valve area. 4.  There is mild mitral regurgitation. 5.  Normal pulmonary artery systolic pressure.  )       Anesthesia Quick Evaluation

## 2020-04-16 NOTE — Progress Notes (Signed)
Anesthesia Chart Review:  Patient follows with cardiologist at [facility] heart and vascular in [place] for history of OSA on BiPAP with supplemental oxygen at night and abnormal EKG.  He was seen 02/14/2020 for preop clearance.  Per note, "69 year old man with normal coronary arteries seen on cardiac catheterization performed December 2017 with EF 60-65%, no mitral regurgitation, and no gradient across the aortic valve on the study who presents to cardiology today for preoperative cardiac evaluation prior to back surgery by Dr. Venetia Maxon in [place], [place].  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  This patient has no symptoms of angina and modestly good exercise tolerance.  We will check an echocardiogram to rule out structural heart disease and valvular heart disease.  We will make follow-up recommendations after the above cardiac testing is completed."  Patient subsequently underwent echocardiogram with benign findings and was cleared per letter dated 03/06/2020 stating, "this is to inform you that the above-mentioned patient was evaluated by me and has been cleared for his upcoming surgery.  I feel this patient is low risk from a cardiac standpoint to proceed with back surgery.  Please do not hesitate to call me if I can be of further assistance."  Copy on chart.  OSA on BiPAP with settings 25/19 with 3 L/min oxygen heated humidification  Preop labs reviewed, unremarkable. . EKG 02/13/2020 (copy on chart): Sinus bradycardia.  Rate 54.  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  TTE 02/27/2020 (copy on chart): Impression: 1.  Normal LV size with normal systolic function.  The ejection fraction is 60 to 65%.  Grade 2 diastolic dysfunction.  There is normal left ventricular wall thickness. 2.  Normal right ventricular size with normal function. 3.  There is a normal aortic valve area. 4.  There is  mild mitral regurgitation. 5.  Normal pulmonary artery systolic pressure.  Zannie Cove [facility] Short Stay Center/Anesthesiology Phone [phone] 04/16/2020 11:48 AM

## 2020-04-18 ENCOUNTER — Inpatient Hospital Stay (HOSPITAL_COMMUNITY): Payer: BC Managed Care – PPO

## 2020-04-18 ENCOUNTER — Inpatient Hospital Stay (HOSPITAL_COMMUNITY)
Admission: RE | Admit: 2020-04-18 | Discharge: 2020-05-07 | DRG: 455 | Disposition: A | Discharge status: Skilled Nursing Facility | Payer: BC Managed Care – PPO | Attending: Neurosurgery | Admitting: Neurosurgery

## 2020-04-18 ENCOUNTER — Inpatient Hospital Stay (HOSPITAL_COMMUNITY): Payer: BC Managed Care – PPO | Admitting: Anesthesiology

## 2020-04-18 ENCOUNTER — Encounter (HOSPITAL_COMMUNITY)
Admission: RE | Disposition: A | Discharge status: Skilled Nursing Facility | Payer: Self-pay | Source: Home / Self Care | Attending: Neurosurgery

## 2020-04-18 ENCOUNTER — Inpatient Hospital Stay (HOSPITAL_COMMUNITY): Payer: BC Managed Care – PPO | Admitting: Physician Assistant

## 2020-04-18 ENCOUNTER — Other Ambulatory Visit: Payer: Self-pay

## 2020-04-18 DIAGNOSIS — M4316 Spondylolisthesis, lumbar region: Secondary | ICD-10-CM | POA: Diagnosis present

## 2020-04-18 DIAGNOSIS — Z419 Encounter for procedure for purposes other than remedying health state, unspecified: Secondary | ICD-10-CM

## 2020-04-18 DIAGNOSIS — Z79899 Other long term (current) drug therapy: Secondary | ICD-10-CM | POA: Diagnosis not present

## 2020-04-18 DIAGNOSIS — M1990 Unspecified osteoarthritis, unspecified site: Secondary | ICD-10-CM | POA: Diagnosis present

## 2020-04-18 DIAGNOSIS — M4126 Other idiopathic scoliosis, lumbar region: Secondary | ICD-10-CM | POA: Diagnosis present

## 2020-04-18 DIAGNOSIS — M5116 Intervertebral disc disorders with radiculopathy, lumbar region: Secondary | ICD-10-CM | POA: Diagnosis present

## 2020-04-18 DIAGNOSIS — M545 Low back pain, unspecified: Secondary | ICD-10-CM | POA: Diagnosis present

## 2020-04-18 DIAGNOSIS — Z20822 Contact with and (suspected) exposure to covid-19: Secondary | ICD-10-CM | POA: Diagnosis present

## 2020-04-18 DIAGNOSIS — M5136 Other intervertebral disc degeneration, lumbar region: Secondary | ICD-10-CM | POA: Diagnosis not present

## 2020-04-18 DIAGNOSIS — M48061 Spinal stenosis, lumbar region without neurogenic claudication: Secondary | ICD-10-CM | POA: Diagnosis present

## 2020-04-18 DIAGNOSIS — M419 Scoliosis, unspecified: Secondary | ICD-10-CM

## 2020-04-18 DIAGNOSIS — M21372 Foot drop, left foot: Secondary | ICD-10-CM | POA: Diagnosis present

## 2020-04-18 DIAGNOSIS — G4730 Sleep apnea, unspecified: Secondary | ICD-10-CM | POA: Diagnosis present

## 2020-04-18 DIAGNOSIS — Z23 Encounter for immunization: Secondary | ICD-10-CM | POA: Diagnosis not present

## 2020-04-18 DIAGNOSIS — G8929 Other chronic pain: Secondary | ICD-10-CM | POA: Diagnosis present

## 2020-04-18 DIAGNOSIS — M5416 Radiculopathy, lumbar region: Secondary | ICD-10-CM

## 2020-04-18 HISTORY — PX: ABDOMINAL EXPOSURE: SHX5708

## 2020-04-18 HISTORY — PX: ANTERIOR LUMBAR FUSION: SHX1170

## 2020-04-18 HISTORY — PX: ANTERIOR LAT LUMBAR FUSION: SHX1168

## 2020-04-18 IMAGING — RF DG C-ARM 1-60 MIN
1 series · 5 of 5 positions shown · non-contrast
Comparison: [DATE]

CLINICAL DATA: L4-S1 ALIF and L2-L4 XLIF

EXAM:
LUMBAR SPINE - 2-3 VIEW; DG C-ARM 1-60 MIN

[Series 1: run · 5 of 5 slices shown]
[im 1/5]
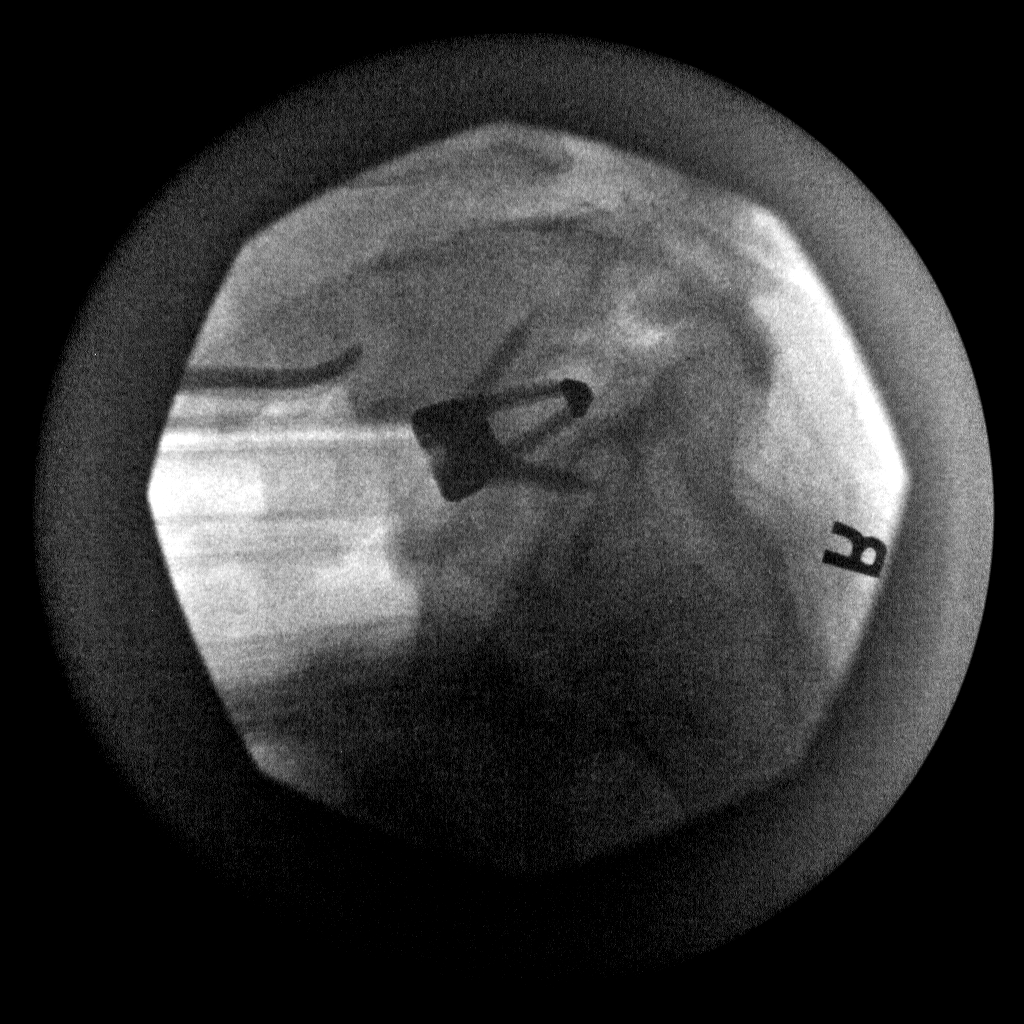
[im 2/5]
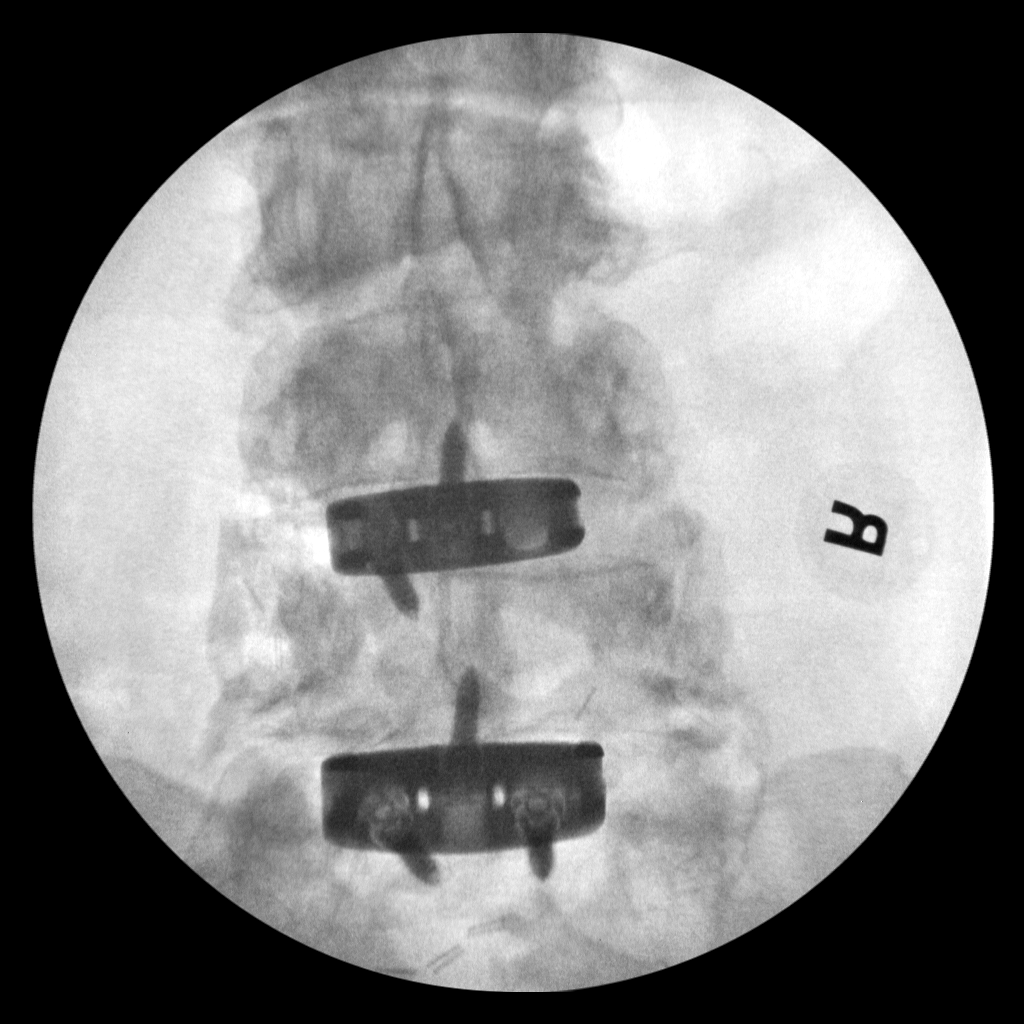
[im 3/5]
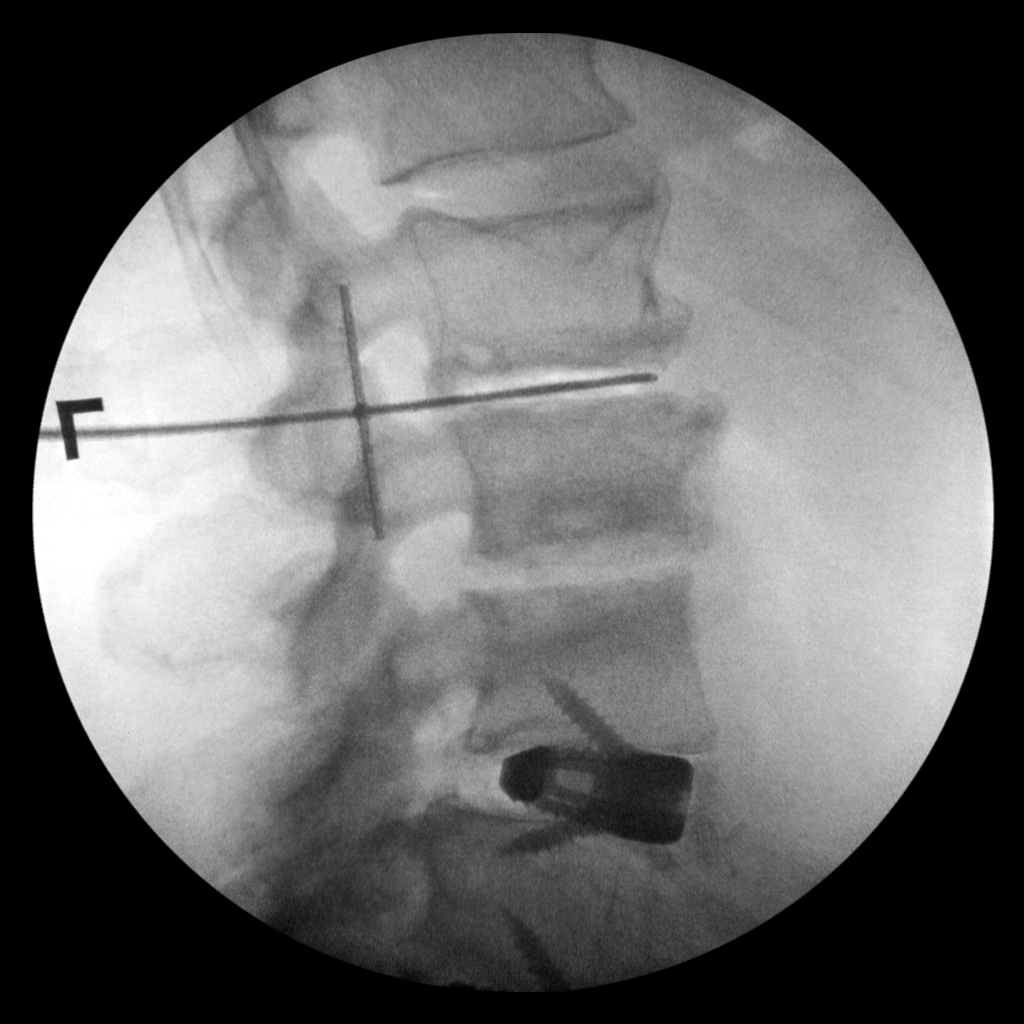
[im 4/5]
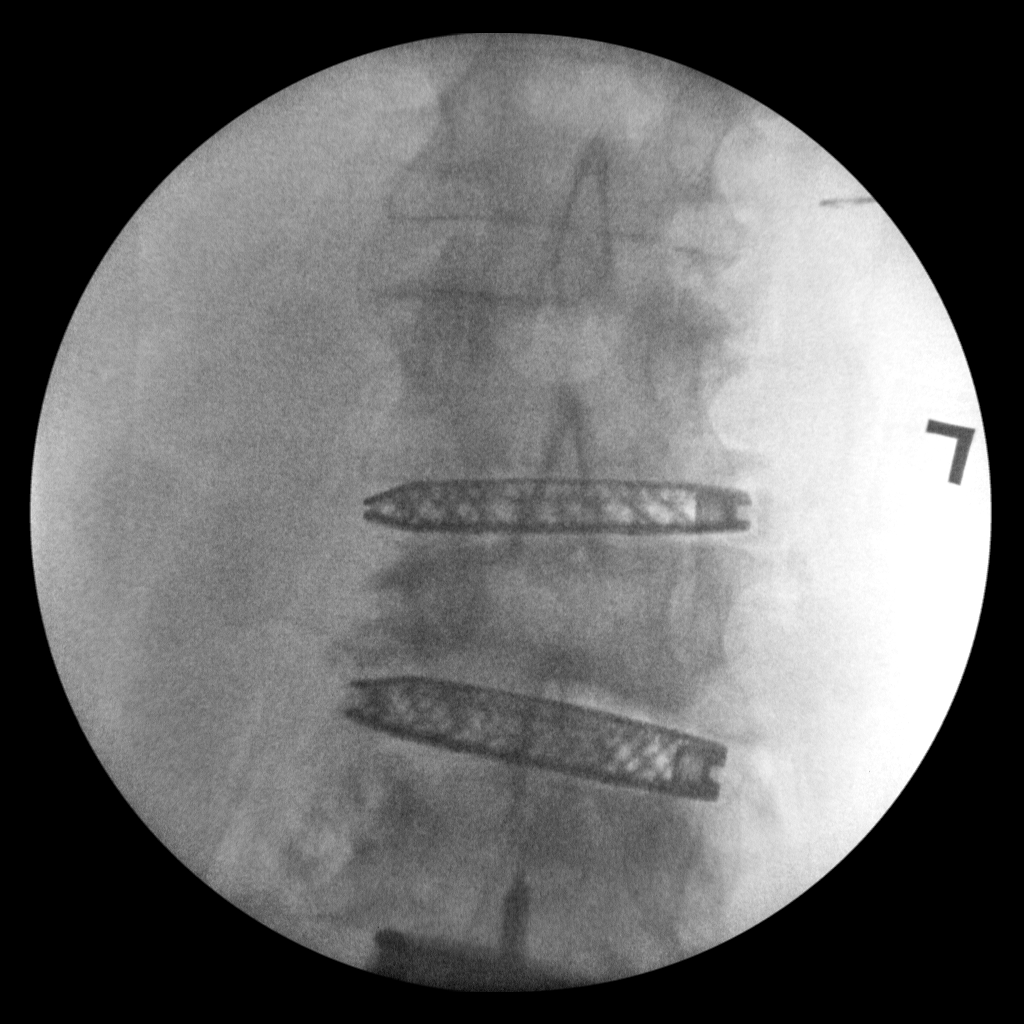
[im 5/5]
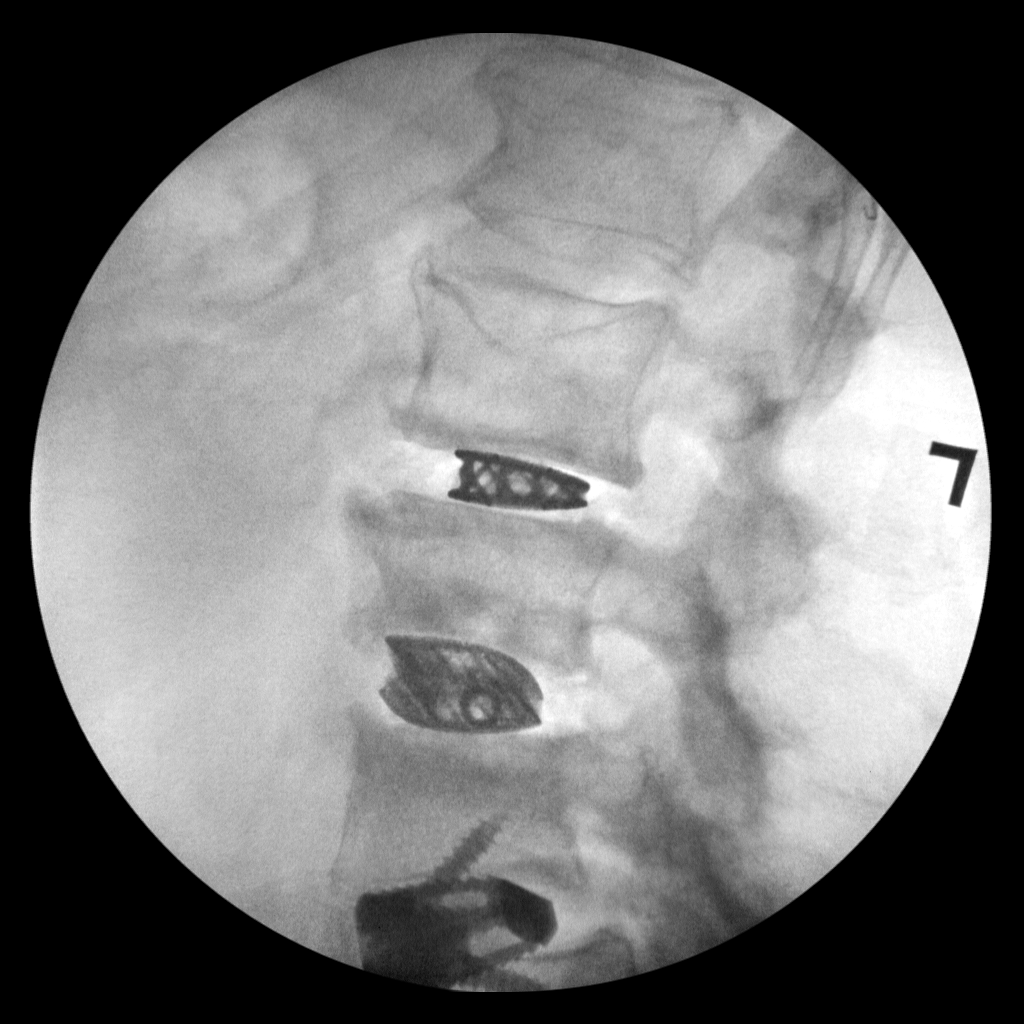

[5 of 5 positions shown; findings below may reference images not displayed]

FINDINGS: 5 C-arm fluoroscopic images were obtained intraoperatively and
submitted for post operative interpretation. Intraoperative images
demonstrate interval placement of interbody fusion hardware within
the lumbar spine, which appear well seated. The final 2 images there
is a metallic density projecting posterior to the L1 vertebral body
on the left. Otherwise, no evidence of retained radiopaque surgical
instrument. 4 minutes and 7 seconds of fluoroscopy time was
utilized. Please see the performing provider's procedural report for
further detail.
IMPRESSION: 1. Intraoperative fluoroscopic images from spinal surgery.
2. A single surgical staple projects within the soft tissues
posterior to the L1 level on the left.
3. Otherwise, no evidence of retained surgical instruments.

These results were called by telephone at the time of interpretation
on [DATE] at [DATE] to OR staff JEAN-PIERRE, who verbally
acknowledged these results.

## 2020-04-18 IMAGING — CR DG OR LOCAL ABDOMEN
1 series · 1 of 1 positions shown · non-contrast
Comparison: None.

CLINICAL DATA: Intraoperative examination.  Incorrect sponge count

EXAM:
OR LOCAL ABDOMEN

[AP]
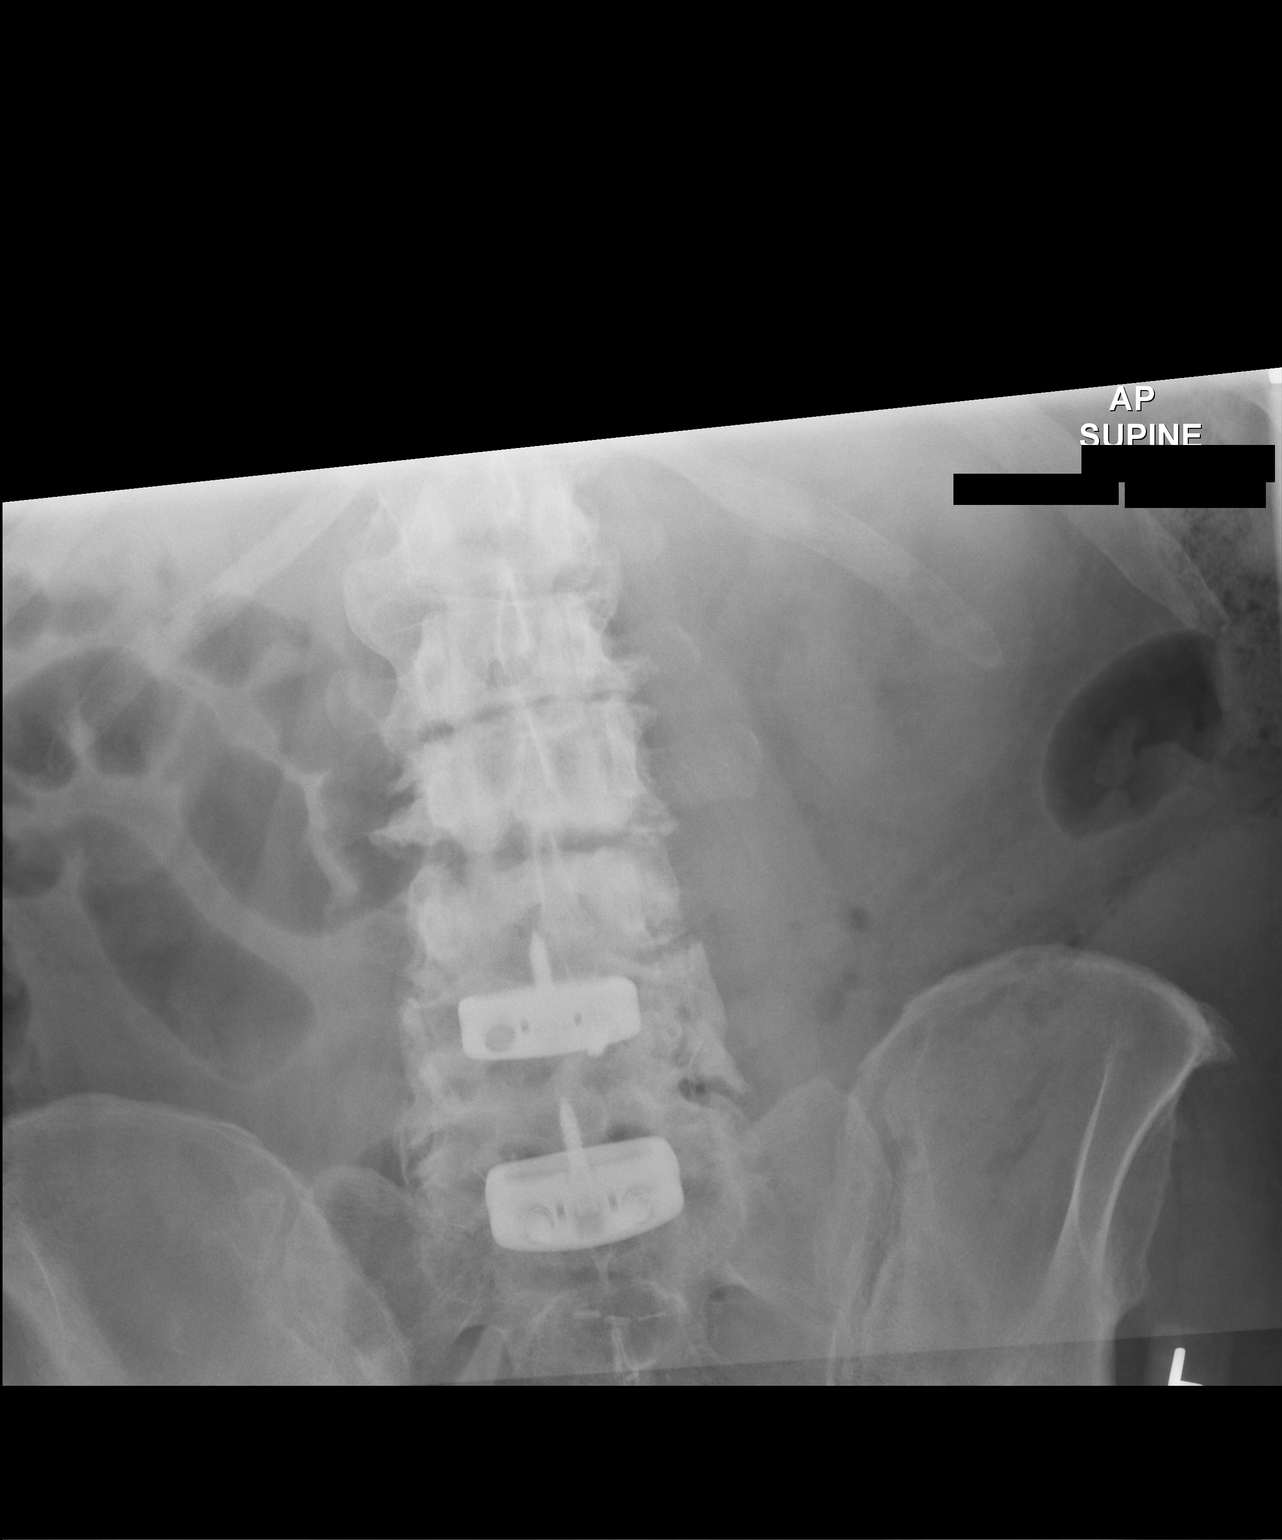

[1 of 1 positions shown; findings below may reference images not displayed]

FINDINGS: Single intraoperative radiograph of the lumbar spine is presented.
Anterior discectomy and fusion procedure of L4-5 and L5-S1 has been
performed with interbody bone cages noted. At least 4 surgical clips
are seen overlying the mid sacrum and 2 clips overlie the left L5-S1
paraspinal region. No unexpected radiopaque foreign body.
Degenerative changes are incidentally noted at L2-3 and L3-4.
Interstitial gas within the left lower quadrant is likely
postsurgical in nature.
IMPRESSION: No unexpected radiopaque foreign body within the abdomen.

These results were called by telephone at the time of interpretation
on [DATE] at [DATE] to provider BONGU , who verbally
acknowledged these results.

## 2020-04-18 IMAGING — RF DG LUMBAR SPINE 2-3V
1 series · 5 of 5 positions shown · non-contrast
Comparison: [DATE]

CLINICAL DATA: L4-S1 ALIF and L2-L4 XLIF

EXAM:
LUMBAR SPINE - 2-3 VIEW; DG C-ARM 1-60 MIN

[Series 1: run · 5 of 5 slices shown]
[im 1/5]
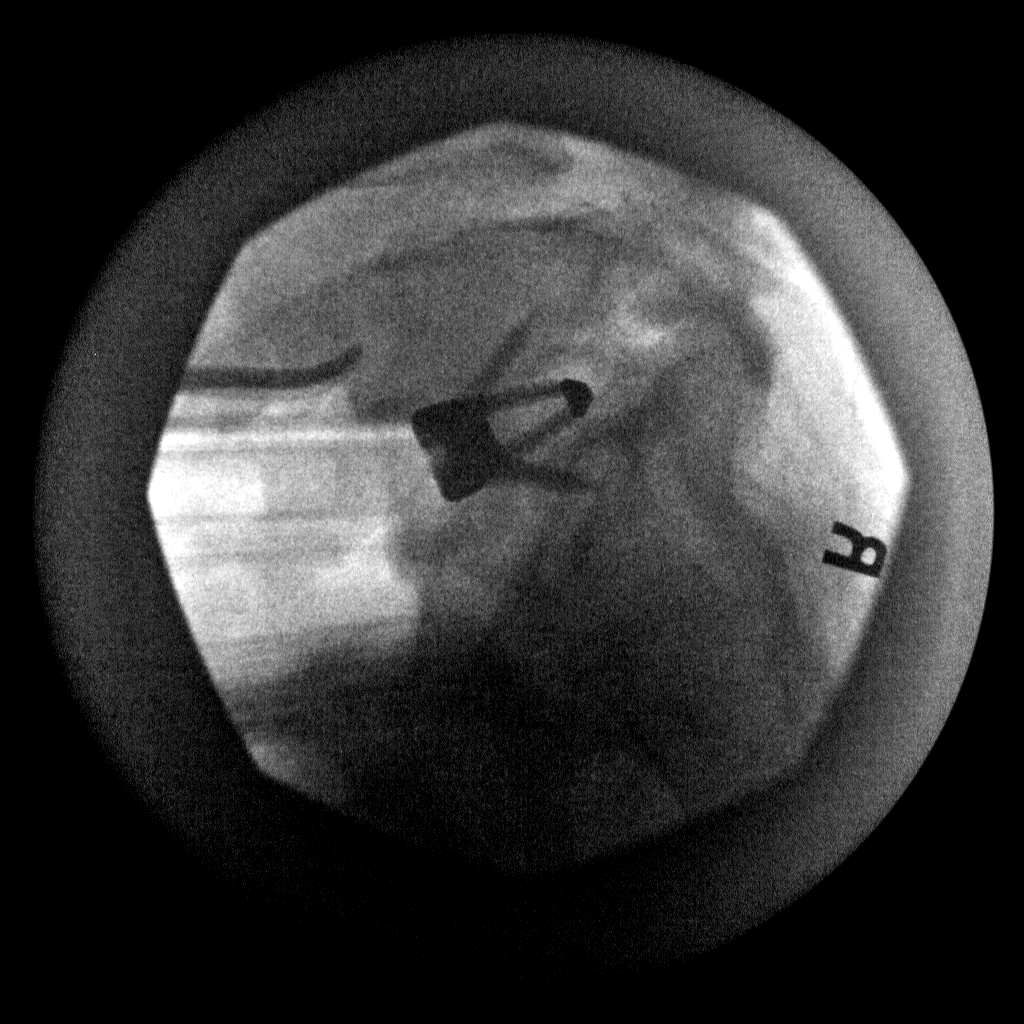
[im 2/5]
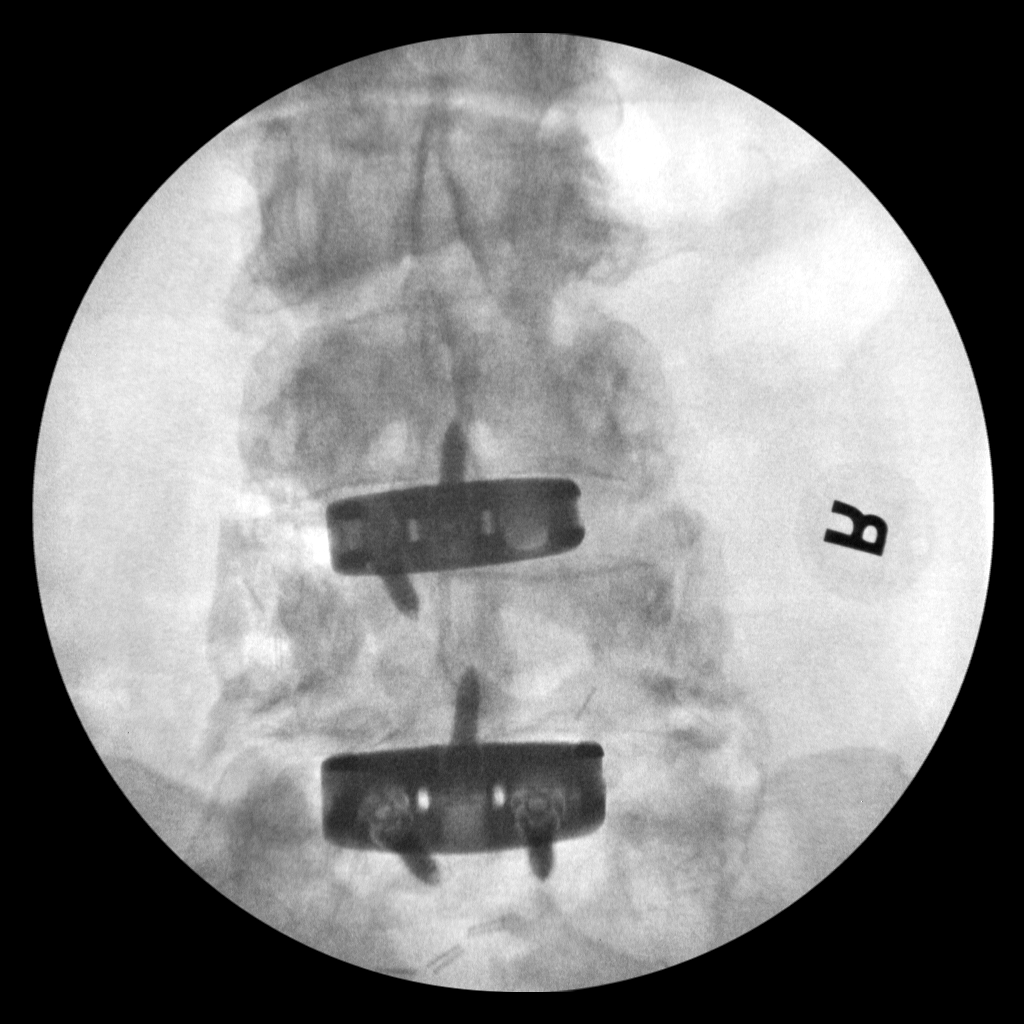
[im 3/5]
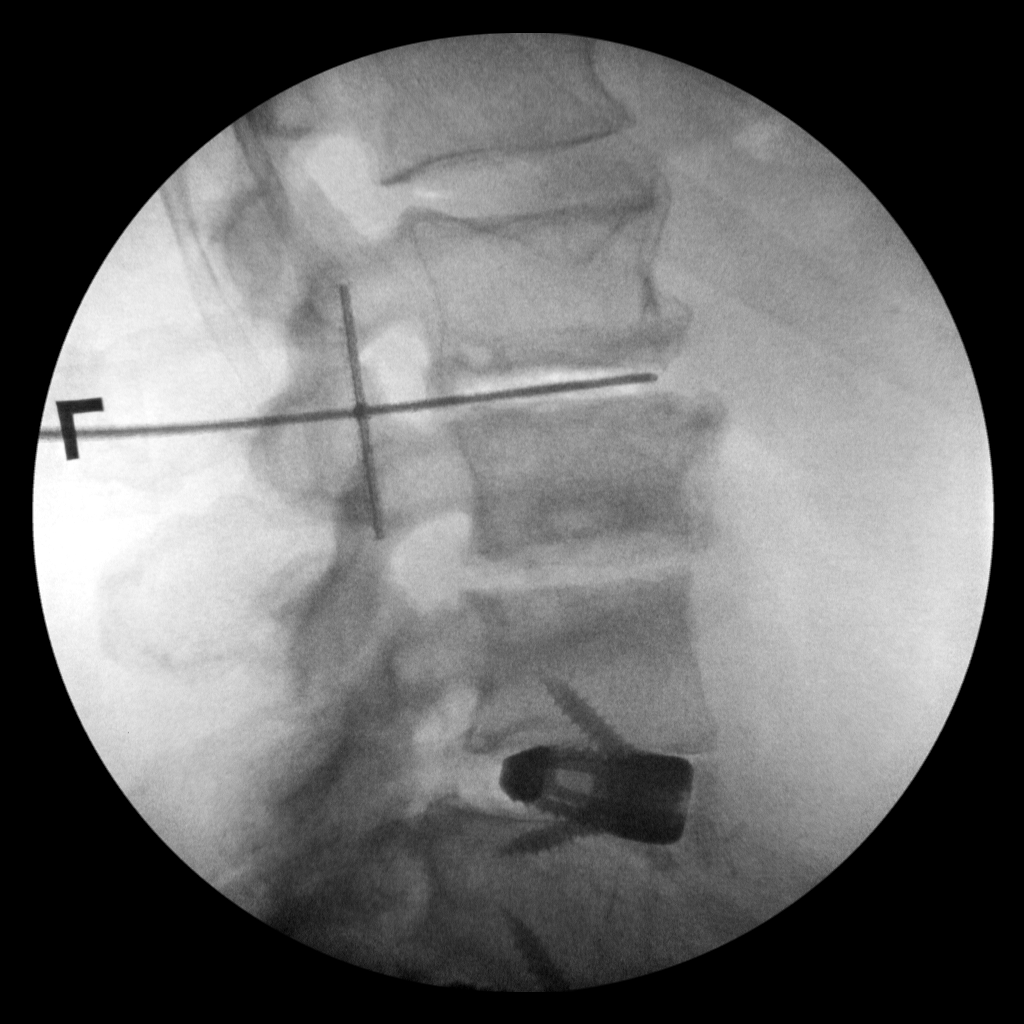
[im 4/5]
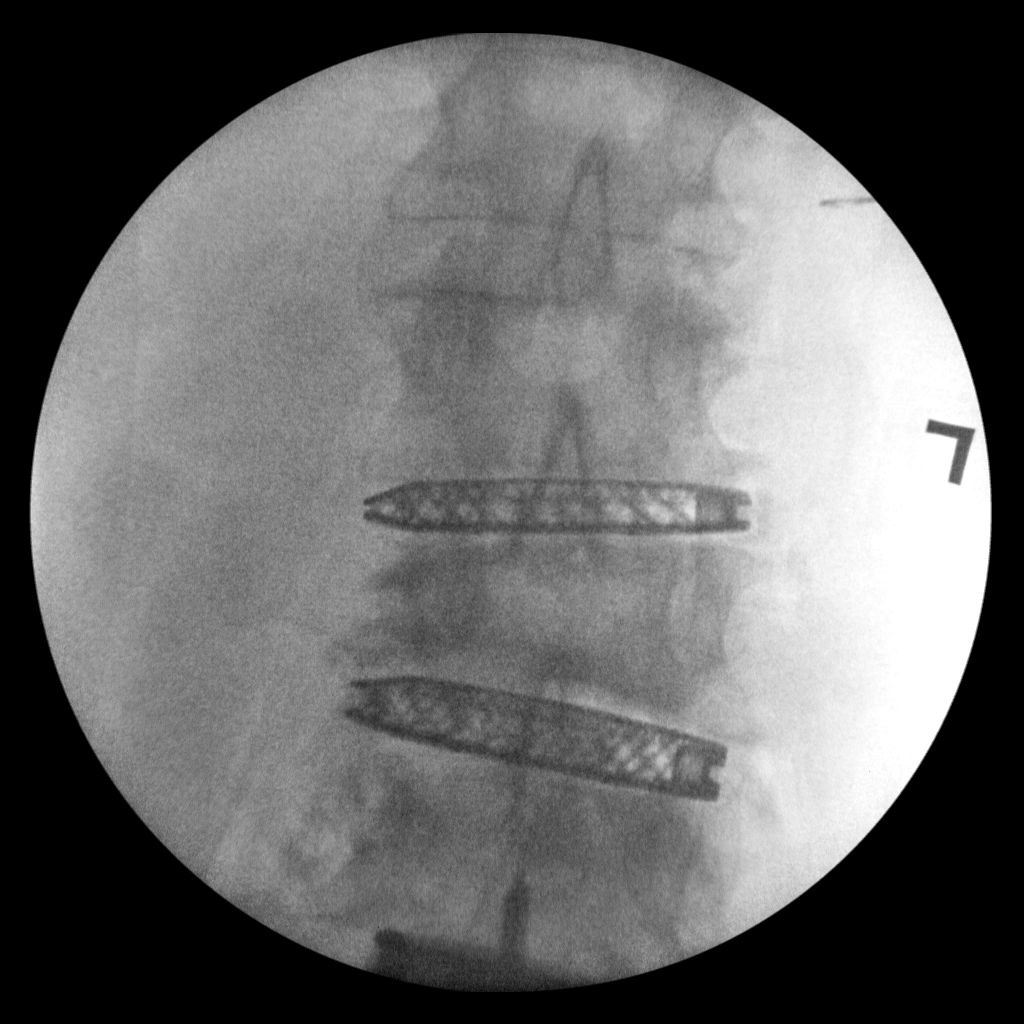
[im 5/5]
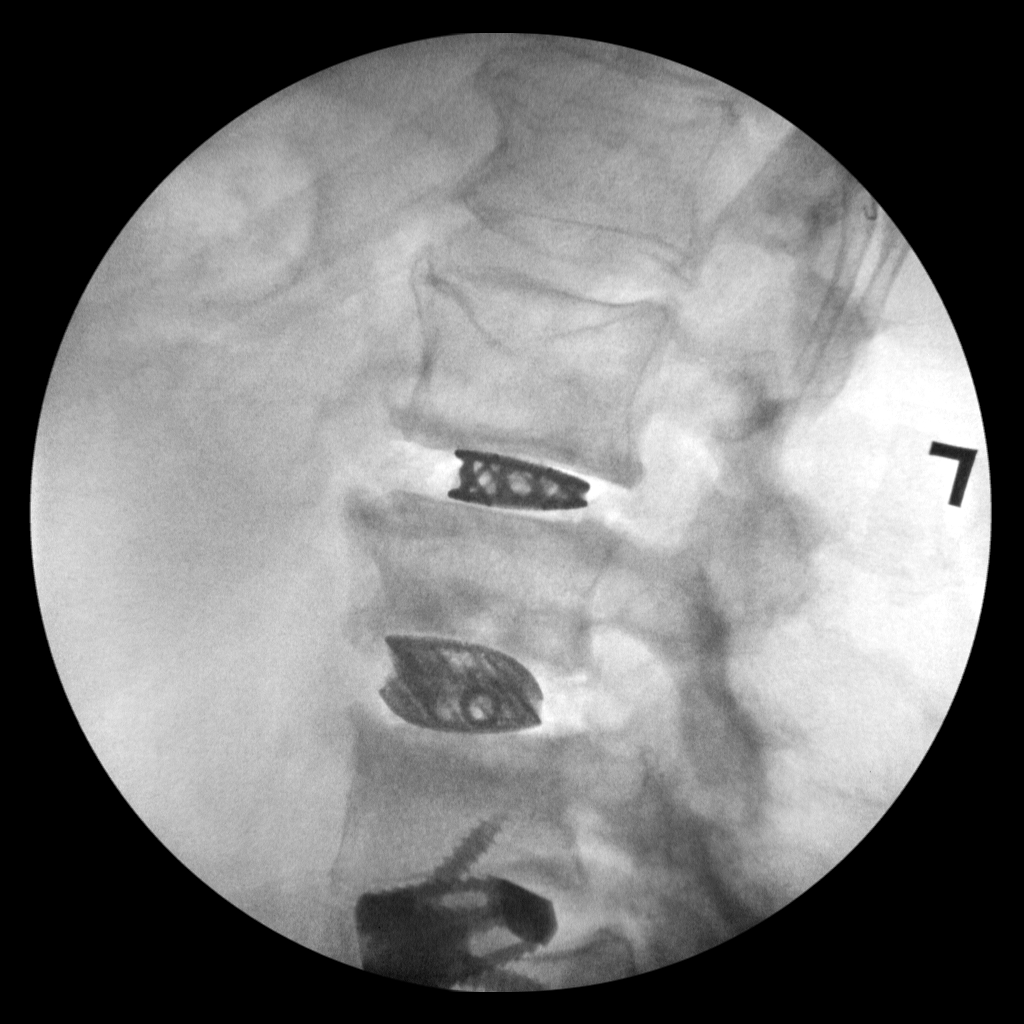

[5 of 5 positions shown; findings below may reference images not displayed]

FINDINGS: 5 C-arm fluoroscopic images were obtained intraoperatively and
submitted for post operative interpretation. Intraoperative images
demonstrate interval placement of interbody fusion hardware within
the lumbar spine, which appear well seated. The final 2 images there
is a metallic density projecting posterior to the L1 vertebral body
on the left. Otherwise, no evidence of retained radiopaque surgical
instrument. 4 minutes and 7 seconds of fluoroscopy time was
utilized. Please see the performing provider's procedural report for
further detail.
IMPRESSION: 1. Intraoperative fluoroscopic images from spinal surgery.
2. A single surgical staple projects within the soft tissues
posterior to the L1 level on the left.
3. Otherwise, no evidence of retained surgical instruments.

These results were called by telephone at the time of interpretation
on [DATE] at [DATE] to OR staff JEAN-PIERRE, who verbally
acknowledged these results.

## 2020-04-18 SURGERY — ANTERIOR LUMBAR FUSION 2 LEVELS
Anesthesia: General | Site: Spine Lumbar

## 2020-04-18 MED ORDER — DEXAMETHASONE SODIUM PHOSPHATE 10 MG/ML IJ SOLN
INTRAMUSCULAR | Status: DC | PRN
  Administered 2020-04-18: 10 mg via INTRAVENOUS

## 2020-04-18 MED ORDER — ROCURONIUM BROMIDE 10 MG/ML (PF) SYRINGE
PREFILLED_SYRINGE | INTRAVENOUS | Status: AC
  Filled 2020-04-18: qty 10

## 2020-04-18 MED ORDER — SODIUM CHLORIDE 0.9 % IV SOLN
250.0000 mL | INTRAVENOUS | Status: DC
  Administered 2020-04-18: 250 mL via INTRAVENOUS

## 2020-04-18 MED ORDER — EPHEDRINE 5 MG/ML INJ
INTRAVENOUS | Status: AC
  Filled 2020-04-18: qty 10

## 2020-04-18 MED ORDER — ADULT MULTIVITAMIN W/MINERALS CH
1.0000 | ORAL_TABLET | Freq: Every day | ORAL | Status: DC
  Administered 2020-04-19 – 2020-05-07 (×18): 1 via ORAL
  Filled 2020-04-18 (×18): qty 1

## 2020-04-18 MED ORDER — THROMBIN 5000 UNITS EX SOLR
CUTANEOUS | Status: AC
  Filled 2020-04-18: qty 15000

## 2020-04-18 MED ORDER — ZOLPIDEM TARTRATE 5 MG PO TABS
5.0000 mg | ORAL_TABLET | Freq: Every evening | ORAL | Status: DC | PRN
  Administered 2020-04-19: 5 mg via ORAL
  Filled 2020-04-18: qty 1

## 2020-04-18 MED ORDER — FERROUS SULFATE 325 (65 FE) MG PO TABS: 325.0000 mg | ORAL_TABLET | Freq: Three times a day (TID) | ORAL | Status: DC

## 2020-04-18 MED ORDER — VITAMIN C 100 MG PO TABS: 100.0000 mg | ORAL_TABLET | Freq: Every day | ORAL | Status: DC

## 2020-04-18 MED ORDER — FLEET ENEMA 7-19 GM/118ML RE ENEM: 1.0000 | ENEMA | Freq: Once | RECTAL | Status: DC | PRN

## 2020-04-18 MED ORDER — SUGAMMADEX SODIUM 200 MG/2ML IV SOLN
INTRAVENOUS | Status: DC | PRN
  Administered 2020-04-18: 200 mg via INTRAVENOUS

## 2020-04-18 MED ORDER — CHLORHEXIDINE GLUCONATE 0.12 % MT SOLN: 15.0000 mL | Freq: Once | OROMUCOSAL | Status: DC

## 2020-04-18 MED ORDER — THROMBIN 5000 UNITS EX SOLR
CUTANEOUS | Status: DC | PRN
  Administered 2020-04-18 (×2): 5000 [IU] via TOPICAL

## 2020-04-18 MED ORDER — CEFAZOLIN SODIUM-DEXTROSE 2-4 GM/100ML-% IV SOLN
2.0000 g | INTRAVENOUS | Status: AC
  Administered 2020-04-18 (×2): 2 g via INTRAVENOUS
  Filled 2020-04-18: qty 100

## 2020-04-18 MED ORDER — THROMBIN 5000 UNITS EX SOLR: OROMUCOSAL | Status: DC | PRN

## 2020-04-18 MED ORDER — LIDOCAINE 2% (20 MG/ML) 5 ML SYRINGE
INTRAMUSCULAR | Status: DC | PRN
  Administered 2020-04-18: 40 mg via INTRAVENOUS

## 2020-04-18 MED ORDER — FENTANYL CITRATE (PF) 250 MCG/5ML IJ SOLN
INTRAMUSCULAR | Status: AC
  Filled 2020-04-18: qty 5

## 2020-04-18 MED ORDER — HYDROMORPHONE HCL 1 MG/ML IJ SOLN
INTRAMUSCULAR | Status: AC
Start: 2020-04-18 — End: 2020-04-19
  Filled 2020-04-18: qty 1

## 2020-04-18 MED ORDER — ACETAMINOPHEN 500 MG PO TABS: 1000.0000 mg | ORAL_TABLET | Freq: Four times a day (QID) | ORAL | Status: DC | PRN

## 2020-04-18 MED ORDER — OXYCODONE HCL 5 MG/5ML PO SOLN: 5.0000 mg | Freq: Once | ORAL | Status: DC | PRN

## 2020-04-18 MED ORDER — POLYETHYLENE GLYCOL 3350 17 G PO PACK
17.0000 g | PACK | Freq: Every day | ORAL | Status: DC | PRN
  Administered 2020-04-18: 17 g via ORAL

## 2020-04-18 MED ORDER — PHENYLEPHRINE 40 MCG/ML (10ML) SYRINGE FOR IV PUSH (FOR BLOOD PRESSURE SUPPORT)
PREFILLED_SYRINGE | INTRAVENOUS | Status: AC
  Filled 2020-04-18: qty 10

## 2020-04-18 MED ORDER — SODIUM CHLORIDE 0.9% FLUSH
3.0000 mL | Freq: Two times a day (BID) | INTRAVENOUS | Status: DC
  Administered 2020-04-18 – 2020-04-20 (×5): 3 mL via INTRAVENOUS

## 2020-04-18 MED ORDER — CHLORHEXIDINE GLUCONATE 0.12 % MT SOLN
OROMUCOSAL | Status: AC
  Administered 2020-04-18: 15 mL
  Filled 2020-04-18: qty 15

## 2020-04-18 MED ORDER — CEFAZOLIN SODIUM 1 G IJ SOLR
INTRAMUSCULAR | Status: AC
  Filled 2020-04-18: qty 20

## 2020-04-18 MED ORDER — OXYCODONE HCL 5 MG PO TABS
5.0000 mg | ORAL_TABLET | ORAL | Status: DC | PRN
  Administered 2020-04-18 – 2020-04-20 (×4): 5 mg via ORAL
  Filled 2020-04-18 (×4): qty 1

## 2020-04-18 MED ORDER — FENTANYL CITRATE (PF) 250 MCG/5ML IJ SOLN
INTRAMUSCULAR | Status: DC | PRN
  Administered 2020-04-18 (×4): 50 ug via INTRAVENOUS
  Administered 2020-04-18 (×3): 25 ug via INTRAVENOUS
  Administered 2020-04-18 (×4): 50 ug via INTRAVENOUS
  Administered 2020-04-18: 25 ug via INTRAVENOUS

## 2020-04-18 MED ORDER — DEXAMETHASONE SODIUM PHOSPHATE 10 MG/ML IJ SOLN
INTRAMUSCULAR | Status: AC
  Filled 2020-04-18: qty 1

## 2020-04-18 MED ORDER — LACTATED RINGERS IV SOLN: INTRAVENOUS | Status: DC | PRN

## 2020-04-18 MED ORDER — ORAL CARE MOUTH RINSE: 15.0000 mL | Freq: Once | OROMUCOSAL | Status: DC

## 2020-04-18 MED ORDER — METHOCARBAMOL 500 MG PO TABS: 500.0000 mg | ORAL_TABLET | Freq: Four times a day (QID) | ORAL | Status: DC | PRN

## 2020-04-18 MED ORDER — LACTATED RINGERS IV SOLN: INTRAVENOUS | Status: DC

## 2020-04-18 MED ORDER — BUPIVACAINE HCL (PF) 0.5 % IJ SOLN
INTRAMUSCULAR | Status: AC
  Filled 2020-04-18: qty 30

## 2020-04-18 MED ORDER — LIDOCAINE-EPINEPHRINE 1 %-1:100000 IJ SOLN
INTRAMUSCULAR | Status: AC
  Filled 2020-04-18: qty 1

## 2020-04-18 MED ORDER — PROPOFOL 10 MG/ML IV BOLUS
INTRAVENOUS | Status: DC | PRN
  Administered 2020-04-18: 140 mg via INTRAVENOUS

## 2020-04-18 MED ORDER — POLYETHYLENE GLYCOL 3350 17 G PO PACK
17.0000 g | PACK | Freq: Two times a day (BID) | ORAL | Status: DC
  Administered 2020-04-19 – 2020-05-05 (×30): 17 g via ORAL
  Filled 2020-04-18 (×36): qty 1

## 2020-04-18 MED ORDER — FENTANYL CITRATE (PF) 100 MCG/2ML IJ SOLN
INTRAMUSCULAR | Status: AC
  Filled 2020-04-18: qty 2

## 2020-04-18 MED ORDER — KCL IN DEXTROSE-NACL 20-5-0.45 MEQ/L-%-% IV SOLN
INTRAVENOUS | Status: DC
  Filled 2020-04-18 (×4): qty 1000

## 2020-04-18 MED ORDER — OXYCODONE HCL 5 MG PO TABS: 5.0000 mg | ORAL_TABLET | Freq: Once | ORAL | Status: DC | PRN

## 2020-04-18 MED ORDER — HYDROMORPHONE HCL 1 MG/ML IJ SOLN
0.2500 mg | INTRAMUSCULAR | Status: DC | PRN
  Administered 2020-04-18: 0.5 mg via INTRAVENOUS

## 2020-04-18 MED ORDER — DOCUSATE SODIUM 100 MG PO CAPS
100.0000 mg | ORAL_CAPSULE | Freq: Two times a day (BID) | ORAL | Status: DC
  Administered 2020-04-18 – 2020-04-20 (×5): 100 mg via ORAL
  Filled 2020-04-18 (×5): qty 1

## 2020-04-18 MED ORDER — HYDROCODONE-ACETAMINOPHEN 5-325 MG PO TABS
2.0000 | ORAL_TABLET | ORAL | Status: DC | PRN
  Administered 2020-04-18 – 2020-04-20 (×5): 2 via ORAL
  Filled 2020-04-18 (×5): qty 2

## 2020-04-18 MED ORDER — PANTOPRAZOLE SODIUM 40 MG IV SOLR
40.0000 mg | Freq: Every day | INTRAVENOUS | Status: DC
  Administered 2020-04-18 – 2020-04-20 (×3): 40 mg via INTRAVENOUS
  Filled 2020-04-18 (×3): qty 40

## 2020-04-18 MED ORDER — ACETAMINOPHEN 10 MG/ML IV SOLN
INTRAVENOUS | Status: DC | PRN
  Administered 2020-04-18: 1000 mg via INTRAVENOUS

## 2020-04-18 MED ORDER — PHENYLEPHRINE HCL-NACL 10-0.9 MG/250ML-% IV SOLN
INTRAVENOUS | Status: DC | PRN
  Administered 2020-04-18: 25 ug/min via INTRAVENOUS

## 2020-04-18 MED ORDER — ONDANSETRON HCL 4 MG PO TABS: 4.0000 mg | ORAL_TABLET | Freq: Four times a day (QID) | ORAL | Status: DC | PRN

## 2020-04-18 MED ORDER — HYDROMORPHONE HCL 1 MG/ML IJ SOLN
0.5000 mg | INTRAMUSCULAR | Status: DC | PRN
  Administered 2020-04-18 – 2020-04-21 (×7): 0.5 mg via INTRAVENOUS
  Filled 2020-04-18 (×8): qty 1

## 2020-04-18 MED ORDER — ACETAMINOPHEN 650 MG RE SUPP: 650.0000 mg | RECTAL | Status: DC | PRN

## 2020-04-18 MED ORDER — HYDROMORPHONE HCL 1 MG/ML IJ SOLN
INTRAMUSCULAR | Status: AC
  Filled 2020-04-18: qty 0.5

## 2020-04-18 MED ORDER — PROPOFOL 500 MG/50ML IV EMUL
INTRAVENOUS | Status: DC | PRN
  Administered 2020-04-18: 25 ug/kg/min via INTRAVENOUS

## 2020-04-18 MED ORDER — PROPOFOL 10 MG/ML IV BOLUS
INTRAVENOUS | Status: AC
  Filled 2020-04-18: qty 40

## 2020-04-18 MED ORDER — SODIUM CHLORIDE 0.9% FLUSH
3.0000 mL | INTRAVENOUS | Status: DC | PRN
  Administered 2020-04-18 – 2020-04-19 (×2): 3 mL via INTRAVENOUS

## 2020-04-18 MED ORDER — HYDROMORPHONE HCL 1 MG/ML IJ SOLN
INTRAMUSCULAR | Status: DC | PRN
Start: 2020-04-18 — End: 2020-04-18
  Administered 2020-04-18 (×2): .5 mg via INTRAVENOUS

## 2020-04-18 MED ORDER — DOCUSATE SODIUM 100 MG PO CAPS: 100.0000 mg | ORAL_CAPSULE | Freq: Two times a day (BID) | ORAL | Status: DC

## 2020-04-18 MED ORDER — CEFAZOLIN SODIUM-DEXTROSE 2-4 GM/100ML-% IV SOLN
2.0000 g | Freq: Three times a day (TID) | INTRAVENOUS | Status: AC
  Administered 2020-04-18 – 2020-04-19 (×2): 2 g via INTRAVENOUS
  Filled 2020-04-18 (×2): qty 100

## 2020-04-18 MED ORDER — ROCURONIUM BROMIDE 10 MG/ML (PF) SYRINGE
PREFILLED_SYRINGE | INTRAVENOUS | Status: DC | PRN
  Administered 2020-04-18: 50 mg via INTRAVENOUS

## 2020-04-18 MED ORDER — METHOCARBAMOL 500 MG PO TABS
500.0000 mg | ORAL_TABLET | Freq: Four times a day (QID) | ORAL | Status: DC | PRN
  Administered 2020-04-19 – 2020-04-20 (×5): 500 mg via ORAL
  Filled 2020-04-18 (×5): qty 1

## 2020-04-18 MED ORDER — CHLORHEXIDINE GLUCONATE CLOTH 2 % EX PADS: 6.0000 | MEDICATED_PAD | Freq: Once | CUTANEOUS | Status: DC

## 2020-04-18 MED ORDER — ONDANSETRON HCL 4 MG/2ML IJ SOLN
4.0000 mg | Freq: Four times a day (QID) | INTRAMUSCULAR | Status: DC | PRN
  Administered 2020-04-18: 4 mg via INTRAVENOUS
  Filled 2020-04-18: qty 2

## 2020-04-18 MED ORDER — ALUM & MAG HYDROXIDE-SIMETH 200-200-20 MG/5ML PO SUSP: 30.0000 mL | Freq: Four times a day (QID) | ORAL | Status: DC | PRN

## 2020-04-18 MED ORDER — ACETAMINOPHEN 325 MG PO TABS
650.0000 mg | ORAL_TABLET | ORAL | Status: DC | PRN
  Administered 2020-04-20 – 2020-04-21 (×2): 650 mg via ORAL
  Filled 2020-04-18 (×2): qty 2

## 2020-04-18 MED ORDER — VITAMIN D 25 MCG (1000 UNIT) PO TABS
1000.0000 [IU] | ORAL_TABLET | Freq: Every day | ORAL | Status: DC
  Administered 2020-04-19 – 2020-05-07 (×18): 1000 [IU] via ORAL
  Filled 2020-04-18 (×18): qty 1

## 2020-04-18 MED ORDER — MENTHOL 3 MG MT LOZG: 1.0000 | LOZENGE | OROMUCOSAL | Status: DC | PRN

## 2020-04-18 MED ORDER — MIDAZOLAM HCL 2 MG/2ML IJ SOLN
INTRAMUSCULAR | Status: DC | PRN
  Administered 2020-04-18: 2 mg via INTRAVENOUS

## 2020-04-18 MED ORDER — FENTANYL CITRATE (PF) 100 MCG/2ML IJ SOLN
25.0000 ug | INTRAMUSCULAR | Status: DC | PRN
  Administered 2020-04-18 (×3): 50 ug via INTRAVENOUS

## 2020-04-18 MED ORDER — ONDANSETRON HCL 4 MG/2ML IJ SOLN: 4.0000 mg | Freq: Once | INTRAMUSCULAR | Status: DC | PRN

## 2020-04-18 MED ORDER — PHENOL 1.4 % MT LIQD: 1.0000 | OROMUCOSAL | Status: DC | PRN

## 2020-04-18 MED ORDER — ONDANSETRON HCL 4 MG/2ML IJ SOLN
INTRAMUSCULAR | Status: DC | PRN
  Administered 2020-04-18: 4 mg via INTRAVENOUS

## 2020-04-18 MED ORDER — BISACODYL 10 MG RE SUPP
10.0000 mg | Freq: Every day | RECTAL | Status: DC | PRN
  Administered 2020-04-20: 10 mg via RECTAL
  Filled 2020-04-18: qty 1

## 2020-04-18 MED ORDER — 0.9 % SODIUM CHLORIDE (POUR BTL) OPTIME
TOPICAL | Status: DC | PRN
  Administered 2020-04-18: 1000 mL

## 2020-04-18 MED ORDER — HYDROCODONE-ACETAMINOPHEN 7.5-325 MG PO TABS
1.0000 | ORAL_TABLET | ORAL | Status: DC | PRN
Start: 2020-04-18 — End: 2020-04-18

## 2020-04-18 MED ORDER — LIDOCAINE 2% (20 MG/ML) 5 ML SYRINGE
INTRAMUSCULAR | Status: AC
  Filled 2020-04-18: qty 5

## 2020-04-18 MED ORDER — MIDAZOLAM HCL 2 MG/2ML IJ SOLN
INTRAMUSCULAR | Status: AC
  Filled 2020-04-18: qty 2

## 2020-04-18 MED ORDER — HYDROMORPHONE HCL 1 MG/ML IJ SOLN
INTRAMUSCULAR | Status: AC
Start: 2020-04-18 — End: ?
  Filled 2020-04-18: qty 0.5

## 2020-04-18 MED ORDER — METHOCARBAMOL 1000 MG/10ML IJ SOLN
500.0000 mg | Freq: Four times a day (QID) | INTRAVENOUS | Status: DC | PRN
  Filled 2020-04-18: qty 5

## 2020-04-18 MED ORDER — HEMOSTATIC AGENTS (NO CHARGE) OPTIME
TOPICAL | Status: DC | PRN
  Administered 2020-04-18: 1 via TOPICAL

## 2020-04-18 MED ORDER — ONDANSETRON HCL 4 MG/2ML IJ SOLN
INTRAMUSCULAR | Status: AC
  Filled 2020-04-18: qty 2

## 2020-04-18 MED ORDER — EPHEDRINE SULFATE 50 MG/ML IJ SOLN
INTRAMUSCULAR | Status: DC | PRN
  Administered 2020-04-18: 5 mg via INTRAVENOUS

## 2020-04-18 SURGICAL SUPPLY — 70 items
APPLIER CLIP 11 MED OPEN (CLIP) ×5
BASE TI HL 6X42X30 20D (Bolt) ×4 IMPLANT
BASE TI HL 6X42X30MM 20 DEG (Bolt) ×1 IMPLANT
BLADE CLIPPER SURG (BLADE) ×5 IMPLANT
BOLT BASE TI 5X17.5 VARIABLE (Bolt) ×15 IMPLANT
BOLT BASE TI 5X20 VARIABLE (Bolt) ×10 IMPLANT
CAGE BASE TI HL 8X38X23 10D (Cage) ×5 IMPLANT
CAGE MODULUS XLW 8X22X55 - 10 (Cage) ×5 IMPLANT
CLIP APPLIE 11 MED OPEN (CLIP) ×3 IMPLANT
DERMABOND ADVANCED (GAUZE/BANDAGES/DRESSINGS) ×4
DERMABOND ADVANCED .7 DNX12 (GAUZE/BANDAGES/DRESSINGS) ×6 IMPLANT
DRAPE C-ARM 42X72 X-RAY (DRAPES) ×5 IMPLANT
DRAPE C-ARMOR (DRAPES) ×5 IMPLANT
DRAPE LAPAROTOMY 100X72X124 (DRAPES) ×10 IMPLANT
DRSG OPSITE POSTOP 3X4 (GAUZE/BANDAGES/DRESSINGS) ×5 IMPLANT
DRSG OPSITE POSTOP 4X6 (GAUZE/BANDAGES/DRESSINGS) ×5 IMPLANT
DRSG OPSITE POSTOP 4X8 (GAUZE/BANDAGES/DRESSINGS) ×5 IMPLANT
DURAPREP 26ML APPLICATOR (WOUND CARE) ×10 IMPLANT
ELECT REM PT RETURN 9FT ADLT (ELECTROSURGICAL) ×10
ELECTRODE REM PT RTRN 9FT ADLT (ELECTROSURGICAL) ×6 IMPLANT
GLOVE BIO SURGEON STRL SZ7.5 (GLOVE) ×5 IMPLANT
GLOVE BIO SURGEON STRL SZ8 (GLOVE) ×10 IMPLANT
GLOVE BIOGEL PI IND STRL 8 (GLOVE) ×9 IMPLANT
GLOVE BIOGEL PI IND STRL 8.5 (GLOVE) ×6 IMPLANT
GLOVE BIOGEL PI INDICATOR 8 (GLOVE) ×6
GLOVE BIOGEL PI INDICATOR 8.5 (GLOVE) ×4
GLOVE ECLIPSE 8.0 STRL XLNG CF (GLOVE) ×10 IMPLANT
GOWN STRL REUS W/ TWL LRG LVL3 (GOWN DISPOSABLE) ×6 IMPLANT
GOWN STRL REUS W/ TWL XL LVL3 (GOWN DISPOSABLE) ×12 IMPLANT
GOWN STRL REUS W/TWL 2XL LVL3 (GOWN DISPOSABLE) ×15 IMPLANT
GOWN STRL REUS W/TWL LRG LVL3 (GOWN DISPOSABLE) ×4
GOWN STRL REUS W/TWL XL LVL3 (GOWN DISPOSABLE) ×8
HEMOSTAT POWDER SURGIFOAM 1G (HEMOSTASIS) ×5 IMPLANT
KIT BASIN OR (CUSTOM PROCEDURE TRAY) ×10 IMPLANT
KIT DILATOR XLIF 5 (KITS) ×3 IMPLANT
KIT INFUSE X SMALL 1.4CC (Orthopedic Implant) ×10 IMPLANT
KIT SURGICAL ACCESS MAXCESS 4 (KITS) ×5 IMPLANT
KIT TURNOVER KIT B (KITS) ×5 IMPLANT
KIT XLIF (KITS) ×2
MODULE NVM5 NEXT GEN EMG (NEEDLE) ×5 IMPLANT
MODULUS XLW 10X22X55MM 10 (Spine Construct) ×5 IMPLANT
NEEDLE HYPO 25X1 1.5 SAFETY (NEEDLE) ×10 IMPLANT
NS IRRIG 1000ML POUR BTL (IV SOLUTION) ×5 IMPLANT
PACK LAMINECTOMY NEURO (CUSTOM PROCEDURE TRAY) ×10 IMPLANT
PUTTY BONE ATTRAX 10CC STRIP (Putty) ×15 IMPLANT
SPONGE LAP 18X18 RF (DISPOSABLE) ×5 IMPLANT
SPONGE SURGIFOAM ABS GEL SZ50 (HEMOSTASIS) ×5 IMPLANT
STAPLER SKIN PROX WIDE 3.9 (STAPLE) ×5 IMPLANT
SUT PDS AB 0 CTX 60 (SUTURE) ×5 IMPLANT
SUT PROLENE 4 0 RB 1 (SUTURE)
SUT PROLENE 4-0 RB1 .5 CRCL 36 (SUTURE) IMPLANT
SUT PROLENE 5 0 CC1 (SUTURE) IMPLANT
SUT PROLENE 6 0 C 1 30 (SUTURE) ×5 IMPLANT
SUT PROLENE 6 0 CC (SUTURE) IMPLANT
SUT SILK 0 TIES 10X30 (SUTURE) ×5 IMPLANT
SUT SILK 2 0 TIES 10X30 (SUTURE) ×5 IMPLANT
SUT SILK 2 0 TIES 17X18 (SUTURE) ×2
SUT SILK 2 0SH CR/8 30 (SUTURE) IMPLANT
SUT SILK 2-0 18XBRD TIE BLK (SUTURE) ×3 IMPLANT
SUT SILK 3 0 TIES 17X18 (SUTURE) ×2
SUT SILK 3 0SH CR/8 30 (SUTURE) IMPLANT
SUT SILK 3-0 18XBRD TIE BLK (SUTURE) ×3 IMPLANT
SUT VIC AB 1 CT1 18XBRD ANBCTR (SUTURE) IMPLANT
SUT VIC AB 1 CT1 8-18 (SUTURE)
SUT VIC AB 2-0 CT1 18 (SUTURE) ×15 IMPLANT
SUT VIC AB 3-0 SH 8-18 (SUTURE) ×15 IMPLANT
TOWEL GREEN STERILE (TOWEL DISPOSABLE) ×10 IMPLANT
TOWEL GREEN STERILE FF (TOWEL DISPOSABLE) ×5 IMPLANT
TRAY FOLEY MTR SLVR 16FR STAT (SET/KITS/TRAYS/PACK) ×5 IMPLANT
WATER STERILE IRR 1000ML POUR (IV SOLUTION) ×5 IMPLANT

## 2020-04-18 NOTE — Interval H&P Note (Signed)
History and Physical Interval Note:  04/18/2020 7:25 AM  Unk Lightning  has presented today for surgery, with the diagnosis of Scoliosis and kyphoscoliosis, Idiopathic.  The various methods of treatment have been discussed with the patient and family. After consideration of risks, benefits and other options for treatment, the patient has consented to  Procedure(s) with comments: Lumbar 4-5 Lumbar 5 Sacral 1 Anterior lumbar interbody fusion with Left Lumbar 2-3 Lumbar 3-4 Anterolateral lumbar interbody fusion (N/A) - Stage 1 w/Less Ray Dr Chestine Spore to co surg ANTERIOR LATERAL LUMBAR FUSION 2 LEVELS (Left) ABDOMINAL EXPOSURE (N/A) as a surgical intervention.  The patient's history has been reviewed, patient examined, no change in status, stable for surgery.  I have reviewed the patient's chart and labs.  Questions were answered to the patient's satisfaction.     [name]

## 2020-04-18 NOTE — Brief Op Note (Signed)
04/18/2020  2:18 PM  PATIENT:  Marco Kennedy  69 y.o. male  PRE-OPERATIVE DIAGNOSIS:  Scoliosis and kyphoscoliosis, Idiopathic with spondylolisthesis, degenerative disc disease, stenosis, radiculopathy, lumbago  POST-OPERATIVE DIAGNOSIS:  Scoliosis and kyphoscoliosis, Idiopathic with spondylolisthesis, degenerative disc disease, stenosis, radiculopathy, lumbago  PROCEDURE:  Procedure(s): Lumbar Four-Five Lumbar Five- Sacral One Anterior lumbar Interbody Fusion (N/A) ANTERIOR LATERAL LUMBAR FUSION LUMBAR TWO-THREE, LUMBAR THREE-FOUR (Left) ABDOMINAL EXPOSURE (N/A)  SURGEON:  Surgeon(s) and Role: Panel 1:    [name], MD - Primary Panel 2:    [name], MD - Primary  PHYSICIAN ASSISTANT: Julien Girt, NP  ASSISTANTS: Poteat, RN   ANESTHESIA:   general  EBL:  500 mL   BLOOD ADMINISTERED:200 CC CELLSAVER  DRAINS: none   LOCAL MEDICATIONS USED:  MARCAINE    and LIDOCAINE   SPECIMEN:  No Specimen  DISPOSITION OF SPECIMEN:  N/A  COUNTS:  YES  TOURNIQUET:  * No tourniquets in log *  DICTATION:DICTATION:   INDICATIONS:  Pateint is 69 year old male with chronic and intractable back and bilateral lower extremity pain with severe kyphoscoliosis. He has intractable pain.   It was elected to take him to surgery for anterior lumbar decompression and fusion at the L 45 and L 5 S1 level and then to perform leftt sided XLIF at L 34 and L 23  Levels as Stage 1, to be followed by Stage 2 posterior decompression and fusion T 10 to pelvis.  PROCEDURE:  Doctor Chestine Spore performed exposure and his portion of the procedure will be dictated separately.  Upon exposing the L 5 S1 level, a localizing X ray was obtained with the C arm.  I then incised the anterior annulus and performed a thorough discectomy with wide ligamentous releases.  The endplates were cleared of disc and cartilagenous material and a thorough discectomy was performed with decompression of the ventral annulus and  disc material. This disc level was severely degenerated. After trials, a 20 degree, 6 x 42 x 30 Base titanium ALIF cage was placed and lagged with 3 5.5 mm screws, two in S 1 of 17.5 mm in length and one in L 5 of 20 mm in length.   This  spacer which was selected, packed with extra small BMP and Attrax.  The implant  positioning was confirmed with C arm.   Locking mechanisms were engaged, soft tissues were inspected and found to be in good repair.  Retractors were moved to expose the L 45 level.   I then incised the anterior annulus and performed a thorough discectomy with wide ligamentous releases.  The endplates were cleared of disc and cartilagenous material and a thorough discectomy was performed with decompression of the ventral annulus and disc material.    After trials, a 10 degree, 8 x 38 x 28 Base titanium ALIF cage was placed and lagged with 2, 5.60mm screws, one in L 4 (20 mm) and one in L 5 (17.5 mm) . This  spacer which was selected, packed with extra small BMP and Attrax.  The implant  positioning was confirmed with C arm.   Locking mechanisms were engaged, soft tissues were inspected and found to be in good repair.   Fascia was closed with 1 PDS running stitch, skin edges closed with 2-0 and 3-0 vicryl sutures.  Wound was dressed with a sterile occlusive dressing.    Patient was then  placed in a right lateral decubitus position on the operative table and using  orthogonally projected C-arm fluoroscopy the patient was placed so that the L 34 and L 23 levels were visualized in AP and lateral plane. The patient was then taped into position.  Skin was marked along with a posterior finger dissection incision. His flank was then prepped and draped in usual sterile fashion and incisions were made sequentially at L  34 level and then the L 23 level working between the 11 th and 12 th ribs. Posterior finger dissection was made to enter the retroperitoneal space and then subsequently the probe was inserted  into the psoas muscle from the left side initially at the L 34  level. After mapping the neural elements were able to dock the probe per the midpoint of this vertebral level and without indications electrically of too close proximity to the neural tissues. Subsequently the self-retaining tractor was.after sequential dilators were utilized the shim was employed and the interspace was cleared of psoas muscle and then incised. A thorough discectomy was performed. Instruments were used to clear the interspace of disc material. After thorough discectomy was performed and this was performed using AP and lateral fluoroscopy a 10 lordotic by 55 x 22 mm implant was packed with extra small BMP and Attrax. This was tamped into position and its position was confirmed on AP and lateral fluoroscopy.  Hemostasis was assured the wounds were irrigated interrupted Vicryl sutures.  Then, at the L 23 level, a thorough discectomy was performed after docking the probe and sequential dilators followed by the retractor and shim.   After thorough discectomy was performed and this was performed using AP and lateral fluoroscopy an 8 lordotic by 55 x 22 mm implant was packed with extra small BMP and Attrax. This was tamped into position and its position was confirmed on AP and lateral fluoroscopy.  Hemostasis was assured the wounds were irrigated interrupted Vicryl sutures.   Sterile occlusive dressing was placed with Dermabond. The patient was then extubated in the operating room and taken to recovery in stable and satisfactory condition having tolerated his operation well. Counts were correct at the end of the case.  Patient was extubated in the OR and taken to recovery having tolerated his surgery well.  Counts were correct.    We were able to gain 11 degrees of correction with these maneuvers.    PLAN OF CARE: Admit to inpatient   PATIENT DISPOSITION:  PACU - hemodynamically stable.   Delay start of Pharmacological VTE agent  (>24hrs) due to surgical blood loss or risk of bleeding: yes  Retracor times: L 23 13 minutes; L 34 11 minutes.

## 2020-04-18 NOTE — H&P (Signed)
REASON FOR CONSULT: Evaluate for L4-L5 and L5-S1 ALIF  HPI: Marco Kennedy is a 69 y.o. male, with history of sleep apnea the presents for evaluation of two level ALIF at L4-L5 and L5-S1 with Dr. Venetia Maxon.  Patient reports several years of chronic lower back pain with some radiculopathy down the left leg.  He has had failed conservative management.  States he has a very difficult time riding in a car.  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  Currently walking with a walker.  He reports his only abdominal surgery is a left inguinal hernia repair with a left groin incision that was done years ago.  He is here with his wife and lives in [place].  States he is following up with his pulmonologist for his sleep apnea prior to surgery also seeing cardiologist for some EKG abnormality from years ago.      Past Medical History:  Diagnosis Date  . Arthritis    arthritis-bilateral knees, left Hip.  . EKG abnormality    06-08-16- being evaluated by cardiologist  in [place]  . Sleep apnea    Bipap use nightly 25/19 -3l/m oxygen  . Transfusion history    [place] ,[place] after bleeding post colon polyp removal and colonscopy procedure         Past Surgical History:  Procedure Laterality Date  . BACK SURGERY     lumbar disc surgery  . COLONOSCOPY W/ POLYPECTOMY    . HERNIA REPAIR Left    left inguinal hernia repair  . KNEE ARTHROSCOPY Right    x3 -scope for meniscus  . TOTAL HIP ARTHROPLASTY Left 06/15/2016   Procedure: LEFT TOTAL HIP ARTHROPLASTY ANTERIOR APPROACH;  Surgeon: [name], MD;  Location: [facility];  Service: Orthopedics;  Laterality: Left;         Family History  Problem Relation Age of Onset  . Hypertension Mother   . Arthritis Mother     SOCIAL HISTORY: Social History        Socioeconomic History  . Marital status: Married    Spouse name: Not on file  . Number of children: Not on file  . Years of education: Not  on file  . Highest education level: Not on file  Occupational History  . Not on file  Tobacco Use  . Smoking status: Never Smoker  . Smokeless tobacco: Never Used  Substance and Sexual Activity  . Alcohol use: Yes    Comment: rare- social   . Drug use: No  . Sexual activity: Yes  Other Topics Concern  . Not on file  Social History Narrative  . Not on file   Social Determinants of Health      Financial Resource Strain:   . Difficulty of Paying Living Expenses:   Food Insecurity:   . Worried About Programme researcher, broadcasting/film/video in the Last Year:   . Barista in the Last Year:   Transportation Needs:   . Freight forwarder (Medical):   Marland Kitchen Lack of Transportation (Non-Medical):   Physical Activity:   . Days of Exercise per Week:   . Minutes of Exercise per Session:   Stress:   . Feeling of Stress :   Social Connections:   . Frequency of Communication with Friends and Family:   . Frequency of Social Gatherings with Friends and Family:   . Attends Religious Services:   . Active Member of Clubs or Organizations:   . Attends Club  or Organization Meetings:   Marland Kitchen Marital Status:   Intimate Partner Violence:   . Fear of Current or Ex-Partner:   . Emotionally Abused:   Marland Kitchen Physically Abused:   . Sexually Abused:     No Known Allergies        Current Outpatient Medications  Medication Sig Dispense Refill  . acetaminophen (TYLENOL) 500 MG tablet Take 500 mg by mouth every 6 (six) hours as needed.    . docusate sodium (COLACE) 100 MG capsule Take 1 capsule (100 mg total) by mouth 2 (two) times daily. 10 capsule 0  . ferrous sulfate 325 (65 FE) MG tablet Take 1 tablet (325 mg total) by mouth 3 (three) times daily after meals.  3  . Glucosamine-Chondroitin (OSTEO BI-FLEX REGULAR STRENGTH PO) Take 2 capsules by mouth daily. (Patient not taking: Reported on 12/25/2019)    . HYDROcodone-acetaminophen (NORCO) 7.5-325 MG tablet Take 1-2 tablets by mouth every 4 (four) hours as  needed for moderate pain. (Patient not taking: Reported on 12/25/2019) 60 tablet 0  . methocarbamol (ROBAXIN) 500 MG tablet Take 1 tablet (500 mg total) by mouth every 6 (six) hours as needed for muscle spasms. (Patient not taking: Reported on 12/25/2019) 40 tablet 0  . polyethylene glycol (MIRALAX / GLYCOLAX) packet Take 17 g by mouth 2 (two) times daily. (Patient not taking: Reported on 12/25/2019) 14 each 0   No current facility-administered medications for this visit.    REVIEW OF SYSTEMS:  [X]  denotes positive finding, [ ]  denotes negative finding Cardiac  Comments:  Chest pain or chest pressure:    Shortness of breath upon exertion:    Short of breath when lying flat:    Irregular heart rhythm:        Vascular    Pain in calf, thigh, or hip brought on by ambulation:    Pain in feet at night that wakes you up from your sleep:     Blood clot in your veins:    Leg swelling:         Pulmonary    Oxygen at home:    Productive cough:     Wheezing:         Neurologic    Sudden weakness in arms or legs:     Sudden numbness in arms or legs:     Sudden onset of difficulty speaking or slurred speech:    Temporary loss of vision in one eye:     Problems with dizziness:         Gastrointestinal    Blood in stool:     Vomited blood:         Genitourinary    Burning when urinating:     Blood in urine:        Psychiatric    Major depression:         Hematologic    Bleeding problems:    Problems with blood clotting too easily:        Skin    Rashes or ulcers:        Constitutional    Fever or chills:      PHYSICAL EXAM:    Vitals:   12/25/19 1103  BP: (!) 137/96  Pulse: (!) 55  Resp: 16  Temp: (!) 97.2 F (36.2 C)  TempSrc: Temporal  SpO2: 96%  Weight: 240 lb (108.9 kg)  Height: 5' 6.5" (1.689 m)    GENERAL: The patient is a well-nourished male, in no acute  distress.  The vital signs are documented above. CARDIAC: There is a regular rate and rhythm.  VASCULAR:  Palpable femoral pulses bilaterally Left DP and PT palpable PULMONARY: There is good air exchange bilaterally without wheezing or rales. ABDOMEN: Soft and non-tender with normal pitched bowel sounds.  Left lower groin incision from previous hernia repair MUSCULOSKELETAL: There are no major deformities or cyanosis. NEUROLOGIC: No focal weakness or paresthesias are detected.   DATA:   I have independently reviewed his MR lumbar spine from 11/2019 and his aortic bifurcation appears to be at L4 with iliac vein bifurcation just below this and appropriate planes posterior for mobilization.  Assessment/Plan:  69 year old male with chronic lower back pain that presents forL4-L5 and L5-S1 ALIF.  I discussed steps of surgery including left paramedian incision over the left rectus muscle with mobilization of the left rectus and entering the retroperitoneum and mobilizing the peritoneum/intestines and left ureter across midline and then mobilization of the left iliac artery and vein to expose the disc space appropriately.  We talked about injury to any of the above structures and other risks as well.  I reviewed his MRI imaging and I think he would be a good candidate for anterior approach.  Look forward to assisting Dr. Venetia Maxon.  No other changes since I last saw him.  Has lost about 60 pounds.  Risks and benefits discussed again in detail.   [name], MD [facility] Office: [phone]

## 2020-04-18 NOTE — Anesthesia Procedure Notes (Signed)
Procedure Name: Intubation Date/Time: 04/18/2020 7:57 AM Performed by: Clearnce Sorrel, CRNA Pre-anesthesia Checklist: Patient identified, Emergency Drugs available, Suction available, Patient being monitored and Timeout performed Patient Re-evaluated:Patient Re-evaluated prior to induction Oxygen Delivery Method: Circle system utilized Preoxygenation: Pre-oxygenation with 100% oxygen Induction Type: IV induction Ventilation: Mask ventilation without difficulty and Oral airway inserted - appropriate to patient size Laryngoscope Size: Mac and 4 Grade View: Grade I Tube type: Oral Tube size: 7.5 mm Number of attempts: 1 Airway Equipment and Method: Stylet Placement Confirmation: ETT inserted through vocal cords under direct vision,  positive ETCO2 and breath sounds checked- equal and bilateral Secured at: 23 cm Tube secured with: Tape Dental Injury: Teeth and Oropharynx as per pre-operative assessment

## 2020-04-18 NOTE — Transfer of Care (Signed)
Immediate Anesthesia Transfer of Care Note  Patient: KEATON BEICHNER  Procedure(s) Performed: Lumbar Four-Five Lumbar Five- Sacral One Anterior lumbar Interbody Fusion (N/A Abdomen) ANTERIOR LATERAL LUMBAR FUSION LUMBAR TWO-THREE, LUMBAR THREE-FOUR (Left Spine Lumbar) ABDOMINAL EXPOSURE (N/A )  Patient Location: PACU  Anesthesia Type:General  Level of Consciousness: sedated  Airway & Oxygen Therapy: Patient Spontanous Breathing and Patient connected to nasal cannula oxygen  Post-op Assessment: Report given to RN and Post -op Vital signs reviewed and stable  Post vital signs: Reviewed and stable  Last Vitals:  Vitals Value Taken Time  BP 155/77 04/18/20 1429  Temp    Pulse 57 04/18/20 1432  Resp 14 04/18/20 1432  SpO2 100 % 04/18/20 1432  Vitals shown include unvalidated device data.  Last Pain:  Vitals:   04/18/20 0624  TempSrc:   PainSc: 2          Complications: No complications documented.

## 2020-04-18 NOTE — Op Note (Signed)
Date: April 18, 2020  Preoperative diagnosis: Chronic lower back pain with radiculopathy  Postoperative diagnosis: Same  Procedure: Anterior abdominal spine exposure at the L4-L5 and L5-S1 disc space for anterior lumbar interbody fusion (L4-L5 and L5-S1 ALIF)  Surgeon: Dr. [name], MD  Co-surgeon: Dr. [name], MD  Assistant: Georgiann Cocker  Indications: Patient is a 69 year old male with worsening chronic lower back pain and right leg radiculopathy.  He has failed conservative management.  Ultimately he has been evaluated by Dr. Venetia Maxon who has recommended anterior lumbar interbody fusion at L4-L5 and L5-S1 as well as an XLIF at L2-L3 L3-L4 through the first stage and then a second stage procedure as well.  Vascular surgery has been asked to provide anterior spine exposure at the L4-L5 and L5-S1 disc base after risk benefits discussed.  Findings: Left paramedian incision over the left rectus muscle.  Subsequently dissected down and the posterior rectus sheath was opened above the arcuate line.  Entered the retroperitoneal space and the peritoneum as well as the left ureter were mobilized across midline.  Initially the L5-S1 disc base was exposed after ligating the middle sacral vessels.  Ultimately fixed retractors were placed and the disc space was confirmed on lateral fluoroscopy.  Once Dr. Venetia Maxon had completed the implant at L5-S1, I then came back in the room and we mobilized the L4-L5 disc space.  This required ligating two iliolumbar veins and the left iliac vein and artery were mobilized across midline exposing the L4-L5 disc space.  This was confirmed with a spinal needle on lateral fluoroscopy as well.  Anesthesia: General  Details: Patient was taken to the operating room after informed consent was obtained.  Placed on the operative table supine position.  General endotracheal anesthesia induced.  Initially used fluoroscopic C-arm in the lateral position to identify the  L4-L5 and L5-S1 disc space that was then marked on the anterior abdominal wall over the left rectus muscle.  The abdomen was then prepped and draped in usual sterile fashion.  Timeout was performed and patient did get preoperative antibiotics.  Initially performed a paramedian incision over our preoperative mark.  Dissected down and subcutaneous tissue was opened with Bovie cautery until we encountered the anterior rectus sheath which was then also opened longitudinally with Bovie cautery.  Hemostats were used and flaps were raised and the left rectus muscle was then circumferentially mobilized.  I did visualize the posterior rectus sheath above arcuate line.  I bluntly mobilized the peritoneum off of this posteriorly and the posterior rectus sheath was divided for further visualization.  I then entered the retroperitoneal space lateral and the peritoneum as well as the left ureter were identified and swept across midline.  This point in time my assistant used hand-held Wiley retractors to pull the ureter and peritoneum across midline while using a Balfour retractor with a lap sponge for added visualization.  Initial focus was on the L5-S1 disc space at Dr. Fredrich Birks request.  This did require mobilization of the middle sacral vessels between 2-0 silk suture and vessel clips and these were divided.  I did have to mobilize the left iliac vein lateral.  Ultimately we then placed fixed NuVasive retractor using 180 lip fixed blades.  Ultimately we confirmed on lateral fluoroscopy that we were at the correct level of L5-S1 and the case was turned over to Dr. Venetia Maxon. I was paged back in the room after the implant was completed at L5-S1.   I then came back in  the room and the fixed NuVasive retractor was removed at the L5-S1 disc space including all the blades.  I then put the Balfour retractor back in and then we went lateral to the left iliac artery and this was mobilized bluntly to midline using KD and suction while  again my assistant was using Wiley's to pull the iliac artery medial.  I then visualized the left iliac vein and two iliolumbar branches were identified and ligated between 2-0 silk ties and divided as well as vessel clips.  We then fully mobilized the left iliac vein also across midline.  Once we identified the L4-L5 disc space, I then placed a fixed NuVasive retractor back in and again used 180 lip and 160 lip fixed blades in order to fully expose this level again with the Nuvasive retractor.  Again a spinal needle was placed and we confirmed on lateral fluoroscopy that we were at the correct level.  Case was turned over to Dr. Venetia Maxon.  Please see his dictation for remainder of the case.    Complication: None  Condition: Stable  [name], MD [facility] Office: [phone]   [name]

## 2020-04-18 NOTE — Anesthesia Postprocedure Evaluation (Signed)
Anesthesia Post Note  Patient: Marco Kennedy  Procedure(s) Performed: Lumbar Four-Five Lumbar Five- Sacral One Anterior lumbar Interbody Fusion (N/A Abdomen) ANTERIOR LATERAL LUMBAR FUSION LUMBAR TWO-THREE, LUMBAR THREE-FOUR (Left Spine Lumbar) ABDOMINAL EXPOSURE (N/A )     Patient location during evaluation: PACU Anesthesia Type: General Level of consciousness: awake and alert Pain management: pain level controlled Vital Signs Assessment: post-procedure vital signs reviewed and stable Respiratory status: spontaneous breathing, nonlabored ventilation, respiratory function stable and patient connected to nasal cannula oxygen Cardiovascular status: blood pressure returned to baseline and stable Postop Assessment: no apparent nausea or vomiting Anesthetic complications: no   No complications documented.  Last Vitals:  Vitals:   04/18/20 1530 04/18/20 1545  BP: (!) 178/94 (!) 149/78  Pulse: 69 67  Resp: 19 13  Temp:    SpO2: 99% 98%    Last Pain:  Vitals:   04/18/20 1530  TempSrc:   PainSc: 7                  Carlei Huang COKER

## 2020-04-18 NOTE — Op Note (Signed)
04/18/2020  2:18 PM  PATIENT:  Marco Kennedy  69 y.o. male  PRE-OPERATIVE DIAGNOSIS:  Scoliosis and kyphoscoliosis, Idiopathic with spondylolisthesis, degenerative disc disease, stenosis, radiculopathy, lumbago  POST-OPERATIVE DIAGNOSIS:  Scoliosis and kyphoscoliosis, Idiopathic with spondylolisthesis, degenerative disc disease, stenosis, radiculopathy, lumbago  PROCEDURE:  Procedure(s): Lumbar Four-Five Lumbar Five- Sacral One Anterior lumbar Interbody Fusion (N/A) ANTERIOR LATERAL LUMBAR FUSION LUMBAR TWO-THREE, LUMBAR THREE-FOUR (Left) ABDOMINAL EXPOSURE (N/A)  SURGEON:  Surgeon(s) and Role: Panel 1:    [name], MD - Primary Panel 2:    [name], MD - Primary  PHYSICIAN ASSISTANT: Julien Girt, NP  ASSISTANTS: Poteat, RN   ANESTHESIA:   general  EBL:  500 mL   BLOOD ADMINISTERED:200 CC CELLSAVER  DRAINS: none   LOCAL MEDICATIONS USED:  MARCAINE    and LIDOCAINE   SPECIMEN:  No Specimen  DISPOSITION OF SPECIMEN:  N/A  COUNTS:  YES  TOURNIQUET:  * No tourniquets in log *  DICTATION:DICTATION:   INDICATIONS:  Pateint is 69 year old male with chronic and intractable back and bilateral lower extremity pain with severe kyphoscoliosis. He has intractable pain.   It was elected to take him to surgery for anterior lumbar decompression and fusion at the L 45 and L 5 S1 level and then to perform leftt sided XLIF at L 34 and L 23  Levels as Stage 1, to be followed by Stage 2 posterior decompression and fusion T 10 to pelvis.  PROCEDURE:  Doctor Chestine Spore performed exposure and his portion of the procedure will be dictated separately.  Upon exposing the L 5 S1 level, a localizing X ray was obtained with the C arm.  I then incised the anterior annulus and performed a thorough discectomy with wide ligamentous releases.  The endplates were cleared of disc and cartilagenous material and a thorough discectomy was performed with decompression of the ventral annulus and  disc material. This disc level was severely degenerated. After trials, a 20 degree, 6 x 42 x 30 Base titanium ALIF cage was placed and lagged with 3 5.5 mm screws, two in S 1 of 17.5 mm in length and one in L 5 of 20 mm in length.   This  spacer which was selected, packed with extra small BMP and Attrax.  The implant  positioning was confirmed with C arm.   Locking mechanisms were engaged, soft tissues were inspected and found to be in good repair.  Retractors were moved to expose the L 45 level.   I then incised the anterior annulus and performed a thorough discectomy with wide ligamentous releases.  The endplates were cleared of disc and cartilagenous material and a thorough discectomy was performed with decompression of the ventral annulus and disc material.    After trials, a 10 degree, 8 x 38 x 28 Base titanium ALIF cage was placed and lagged with 2, 5.60mm screws, one in L 4 (20 mm) and one in L 5 (17.5 mm) . This  spacer which was selected, packed with extra small BMP and Attrax.  The implant  positioning was confirmed with C arm.   Locking mechanisms were engaged, soft tissues were inspected and found to be in good repair.   Fascia was closed with 1 PDS running stitch, skin edges closed with 2-0 and 3-0 vicryl sutures.  Wound was dressed with a sterile occlusive dressing.    Patient was then  placed in a right lateral decubitus position on the operative table and using  orthogonally projected C-arm fluoroscopy the patient was placed so that the L 34 and L 23 levels were visualized in AP and lateral plane. The patient was then taped into position.  Skin was marked along with a posterior finger dissection incision. His flank was then prepped and draped in usual sterile fashion and incisions were made sequentially at L  34 level and then the L 23 level working between the 11 th and 12 th ribs. Posterior finger dissection was made to enter the retroperitoneal space and then subsequently the probe was inserted  into the psoas muscle from the left side initially at the L 34  level. After mapping the neural elements were able to dock the probe per the midpoint of this vertebral level and without indications electrically of too close proximity to the neural tissues. Subsequently the self-retaining tractor was.after sequential dilators were utilized the shim was employed and the interspace was cleared of psoas muscle and then incised. A thorough discectomy was performed. Instruments were used to clear the interspace of disc material. After thorough discectomy was performed and this was performed using AP and lateral fluoroscopy a 10 lordotic by 55 x 22 mm implant was packed with extra small BMP and Attrax. This was tamped into position and its position was confirmed on AP and lateral fluoroscopy.  Hemostasis was assured the wounds were irrigated interrupted Vicryl sutures.  Then, at the L 23 level, a thorough discectomy was performed after docking the probe and sequential dilators followed by the retractor and shim.   After thorough discectomy was performed and this was performed using AP and lateral fluoroscopy an 8 lordotic by 55 x 22 mm implant was packed with extra small BMP and Attrax. This was tamped into position and its position was confirmed on AP and lateral fluoroscopy.  Hemostasis was assured the wounds were irrigated interrupted Vicryl sutures.   Sterile occlusive dressing was placed with Dermabond. The patient was then extubated in the operating room and taken to recovery in stable and satisfactory condition having tolerated his operation well. Counts were correct at the end of the case.  Patient was extubated in the OR and taken to recovery having tolerated his surgery well.  Counts were correct.    We were able to gain 11 degrees of correction with these maneuvers.    PLAN OF CARE: Admit to inpatient   PATIENT DISPOSITION:  PACU - hemodynamically stable.   Delay start of Pharmacological VTE agent  (>24hrs) due to surgical blood loss or risk of bleeding: yes  Retracor times: L 23 13 minutes; L 34 11 minutes.   

## 2020-04-18 NOTE — Plan of Care (Signed)
Care plan initiated.

## 2020-04-19 ENCOUNTER — Inpatient Hospital Stay (HOSPITAL_COMMUNITY): Payer: BC Managed Care – PPO

## 2020-04-19 MED ORDER — CHLORHEXIDINE GLUCONATE CLOTH 2 % EX PADS
6.0000 | MEDICATED_PAD | Freq: Every day | CUTANEOUS | Status: DC
  Administered 2020-04-19 – 2020-05-07 (×12): 6 via TOPICAL

## 2020-04-19 NOTE — Evaluation (Signed)
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 PMH includes: THA 2017, sleep apnea.     Clinical Impression  Pt in bed upon arrival of PT, agreeable to evaluation at this time. Prior to admission the pt was using a RW or SPC for mobility as needed due to pain, but reports independence with all ADLs. The pt now presents with limitations in functional mobility, strength, coordination of his LLE, dynamic stability, and endurane due to above dx and resulting pain, and will continue to benefit from skilled PT to address these deficits. The pt was able to complete initial transfers with min/modA of 2 at this time, and initial gait with modA fo 2 and RW for stability. The pt currently presents with limitations in strength that impact his ability to generate good clearance bilaterally, significant trunk flexion and poor stability in stance requiring assist and frequent cues to maintain upright posture and to assist with RW management. The pt will continue to benefit from skilled PT to further progress functional strength and activity tolerance prior to d/c, and will continue to evaluate after second stage of surgery currently scheduled for 10/25. The pt and his wife would like to work towards d/c home at this time, but are agreeable to other rehab if needed after second surgery. The pt has a daughter who is a PT who has taken off time to stay at home with them for a few days as needed later next week following his second surgery.      Follow Up Recommendations Home health PT;Supervision for mobility/OOB    Equipment Recommendations  3in1 (PT)    Recommendations for Other Services       Precautions /  Restrictions Precautions Precautions: Back;Fall Precaution Booklet Issued: Yes (comment) Required Braces or Orthoses: Spinal Brace Spinal Brace: Thoracolumbosacral orthotic Restrictions Weight Bearing Restrictions: No      Mobility  Bed Mobility Overal bed mobility: Needs Assistance Bed Mobility: Rolling;Sidelying to Sit;Sit to Sidelying Rolling: Min guard Sidelying to sit: Min assist;HOB elevated     Sit to sidelying: Mod assist General bed mobility comments: minG for cues and log roll technique throughout. additional assist to BLE to move into and out of bed    Transfers Overall transfer level: Needs assistance Equipment used: Rolling walker (2 wheeled) Transfers: Sit to/from [employer] Sit to Stand: Mod assist;+2 physical assistance Stand pivot transfers: Mod assist;+2 physical assistance       General transfer comment: modA to power up, cues for hand positioning and upright posture. modA and increased time/assist for each step/RW management  Ambulation/Gait Ambulation/Gait assistance: Mod assist;+2 physical assistance Gait Distance (Feet): 5 Feet (x2) Assistive device: Rolling walker (2 wheeled) Gait Pattern/deviations: Step-to pattern;Decreased dorsiflexion - left;Decreased stride length;Shuffle;Trunk flexed Gait velocity: decreased Gait velocity interpretation: <1.31 ft/sec, indicative of household ambulator General Gait Details: poor clearance bilaterally, especially with LLE, able to clear steps with increased time and effort. contineued cues for trunk/posture     Balance Overall balance assessment: Needs assistance Sitting-balance support: Single extremity supported Sitting balance-Leahy Scale: Fair Sitting balance - Comments: static sit without UE support   Standing balance support: Bilateral upper extremity supported Standing balance-Leahy Scale: Poor Standing balance comment: reliant on  BUE support and modA external support                              Pertinent Vitals/Pain Pain Assessment: Faces Faces Pain Scale: Hurts whole lot Pain Location: back Pain Descriptors / Indicators: Grimacing;Operative site guarding;Sore Pain Intervention(s): Limited activity within patient's tolerance;Monitored during session;Repositioned;Premedicated before session    Home Living Family/patient expects to be discharged to:: Private residence Living Arrangements: Spouse/significant other Available Help at Discharge: Family;Available PRN/intermittently (daughter who is PT for 4 days following d/c) Type of Home: House Home Access: Stairs to enter     Home Layout: Two level;Able to live on main level with bedroom/bathroom Home Equipment: Gilmer Mor - single point;Walker - 2 wheels;Bedside commode Additional Comments: recliner pt could sleep in if needed    Prior Function Level of Independence: Independent with assistive device(s)         Comments: pt reports use of RW or cane as needed, limited by pain      Hand Dominance        Extremity/Trunk Assessment   Upper Extremity Assessment Upper Extremity Assessment: Defer to OT evaluation    Lower Extremity Assessment Lower Extremity Assessment: LLE deficits/detail LLE Deficits / Details: PROM against gravity at knee, ankle, and hip. Pt reports no change in sensation.     Cervical / Trunk Assessment Cervical / Trunk Assessment: Other exceptions Cervical / Trunk Exceptions: s/p spinal surgery  Communication   Communication: No difficulties  Cognition Arousal/Alertness: Awake/alert Behavior During Therapy: WFL for tasks assessed/performed Overall Cognitive Status: Within Functional Limits for tasks assessed                                        General Comments General comments (skin integrity, edema, etc.): VSS on RA    Exercises     Assessment/Plan    PT Assessment Patient needs continued PT services  PT Problem List Decreased activity  tolerance;Decreased strength;Decreased balance;Decreased mobility;Decreased coordination;Pain       PT Treatment Interventions DME instruction;Stair training;Gait training;Functional mobility training;Therapeutic activities;Therapeutic exercise;Balance training;Patient/family education    PT Goals (Current goals can be found in the Care Plan section)  Acute Rehab PT Goals Patient Stated Goal: reduce pain PT Goal Formulation: With patient Time For Goal Achievement: 05/03/20 Potential to Achieve Goals: Good    Frequency Min 5X/week   Barriers to discharge        Co-evaluation PT/OT/SLP Co-Evaluation/Treatment: Yes Reason for Co-Treatment: For patient/therapist safety;To address functional/ADL transfers (concern for limited tolerance due to pain) PT goals addressed during session: Mobility/safety with mobility;Balance;Proper use of DME;Strengthening/ROM         AM-PAC PT "6 Clicks" Mobility  Outcome Measure Help needed turning from your back to your side while in a flat bed without using bedrails?: A Little Help needed moving from lying on your back to sitting on the side of a flat bed without using bedrails?: A Little Help needed moving to and from a bed to a chair (including a wheelchair)?: A Little Help needed standing up from a chair using your arms (e.g., wheelchair or bedside chair)?: A Little Help needed to walk in hospital room?: A Lot Help needed climbing 3-5 steps with a railing? : A Lot 6 Click Score: 16    End of Session Equipment Utilized During Treatment: Gait belt;Back brace Activity Tolerance: Patient tolerated  treatment well;Patient limited by fatigue;Patient limited by pain Patient left: in chair;with call bell/phone within reach;with chair alarm set;with family/visitor present Nurse Communication: Mobility status PT Visit Diagnosis: Muscle weakness (generalized) (M62.81);Difficulty in walking, not elsewhere classified (R26.2);Pain Pain - Right/Left:  Left Pain - part of body: Leg (back)    Time: 1009-1046 (plus 1142 - 1202) PT Time Calculation (min) (ACUTE ONLY): 37 min   Charges:   PT Evaluation $PT Eval Moderate Complexity: 1 Mod PT Treatments $Gait Training: 8-22 mins        Rolm Baptise, PT, DPT   Acute Rehabilitation Department Pager #: [phone]  Gaetana Michaelis 04/19/2020, 1:33 PM

## 2020-04-19 NOTE — Progress Notes (Signed)
    Subjective  - POD #1, status post two-level anterior exposure  Complaining of left leg weakness.   Physical Exam:  Palpable pedal pulses bilaterally       Assessment/Plan:  POD #1  Stable following anterior exposure.  Ongoing neurosurgical evaluation with plans for return to the operating room on Monday.  We will be available as needed.  Wells Siraj Dermody 04/19/2020 10:14 AM --  Vitals:   04/19/20 0342 04/19/20 0843  BP: 133/84 (!) 125/52  Pulse: 96 73  Resp: 18 12  Temp: 97.9 F (36.6 C) 98.4 F (36.9 C)  SpO2: 100% 93%    Intake/Output Summary (Last 24 hours) at 04/19/2020 1014 Last data filed at 04/19/2020 0600 Gross per 24 hour  Intake 2664.71 ml  Output 2360 ml  Net 304.71 ml     Laboratory CBC    Component Value Date/Time   WBC 7.8 04/15/2020 0946   HGB 15.6 04/15/2020 0946   HCT 47.5 04/15/2020 0946   PLT 280 04/15/2020 0946    BMET    Component Value Date/Time   NA 140 04/15/2020 0946   K 3.9 04/15/2020 0946   CL 104 04/15/2020 0946   CO2 25 04/15/2020 0946   GLUCOSE 112 (H) 04/15/2020 0946   BUN 17 04/15/2020 0946   CREATININE 0.83 04/15/2020 0946   CALCIUM 9.2 04/15/2020 0946   GFRNONAA >60 04/15/2020 0946   GFRAA >60 06/16/2016 0425    COAG Lab Results  Component Value Date   INR 1.0 11/13/2007   No results found for: PTT  Antibiotics Anti-infectives (From admission, onward)   Start     Dose/Rate Route Frequency Ordered Stop   04/18/20 2000  ceFAZolin (ANCEF) IVPB 2g/100 mL premix        2 g 200 mL/hr over 30 Minutes Intravenous Every 8 hours 04/18/20 1620 04/19/20 0439   04/18/20 0600  ceFAZolin (ANCEF) IVPB 2g/100 mL premix        2 g 200 mL/hr over 30 Minutes Intravenous On call to O.R. 04/18/20 0543 04/18/20 1200       V. Charlena Cross, M.D., [facility] [place] Office: [phone] Pager:  [phone]

## 2020-04-19 NOTE — Progress Notes (Signed)
RT placed pt on BIPAP dream station for the night with settings of 23/19 (adjusted from home setting of 25/19 for pt comfort) w/3 Lpm bled into the system. Pt respiratory status is stable w/no distress. RT will continue to monitor.

## 2020-04-19 NOTE — Progress Notes (Signed)
Pt placed himself on BIPAP dream station for the night. Pt settings are 23/19 w/3 Lpm bled into the system. Pts respiratory status is stable w/no distress noted at this time. RT will continue to monitor.

## 2020-04-19 NOTE — Evaluation (Addendum)
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 PMH includes: THA 2017, sleep apnea.    Clinical Impression   This 69 y/o male presents with the above. PTA pt was mod independent with ADL and functional mobility. Pt currently presenting with the above and below listed deficits. Pt tolerating bed mobility and OOB to recliner during session, requiring up to modA+2 for functional transfers using RW and up to maxA (+2) for ADL at this time. Am hopeful pt will progress well as pain improves - will continue to monitor for pt progress as noted pt for second surgery planned for Monday. If slow to progress pt may be a great candidate for CIR (vs home with [facility]). Will follow while acutely admitted.     Follow Up Recommendations  Home health OT;Supervision/Assistance - 24 hour (pending progress after second surgery)    Equipment Recommendations  Tub/shower seat           Precautions / Restrictions Precautions Precautions: Back;Fall Precaution Booklet Issued: Yes (comment) Required Braces or Orthoses: Spinal Brace Spinal Brace: Thoracolumbosacral orthotic;Applied in sitting position Restrictions Weight Bearing Restrictions: No      Mobility Bed Mobility Overal bed mobility: Needs Assistance Bed Mobility: Sidelying to Sit;Sit to Sidelying;Rolling Rolling: Min guard Sidelying to sit: Min assist;HOB elevated     Sit to sidelying: Mod assist General bed mobility comments: assist for LEs over EOB, close guarding to ensure stability during trunk elevation, pt able to raise trunk to sitting on his own given increased time/effort     Transfers Overall transfer level: Needs assistance Equipment  used: Rolling walker (2 wheeled) Transfers: Sit to/from [employer] Sit to Stand: Mod assist;+2 physical assistance Stand pivot transfers: Mod assist;+2 physical assistance       General transfer comment: modA to power up, cues for hand positioning and upright posture. modA and increased time/assist for each step/RW management    Balance Overall balance assessment: Needs assistance Sitting-balance support: Single extremity supported Sitting balance-Leahy Scale: Fair Sitting balance - Comments: static sit without UE support   Standing balance support: Bilateral upper extremity supported Standing balance-Leahy Scale: Poor Standing balance comment: reliant on BUE support and modA external support                           ADL either performed or assessed with clinical judgement   ADL Overall ADL's : Needs assistance/impaired Eating/Feeding: Modified independent;Sitting   Grooming: Wash/dry face;Set up;Sitting Grooming Details (indicate cue type and reason): supported sitting  Upper Body Bathing: Min guard;Sitting   Lower Body Bathing: Maximal assistance;+2 for physical assistance;+2 for safety/equipment;Sitting/lateral leans;Sit to/from stand   Upper Body Dressing : Moderate assistance;Sitting Upper Body Dressing Details (indicate cue type and reason): for TLSO management Lower Body Dressing: Maximal assistance;+2 for physical assistance;+2 for safety/equipment;Sit to/from stand   Toilet Transfer: Moderate assistance;+2 for safety/equipment;+2 for physical assistance;Stand-pivot Toilet Transfer Details (indicate cue type and reason): simulated via transfer to recliner  Toileting- Clothing Manipulation and Hygiene: Maximal assistance;+2 for physical assistance;+2 for safety/equipment;Sit to/from stand       Functional mobility during ADLs: Moderate assistance;+2 for physical assistance;+2 for safety/equipment;Rolling walker General ADL Comments: pt  with limitations due to pain, LLE weakness                          Pertinent Vitals/Pain Pain Assessment: Faces Faces Pain Scale: Hurts whole lot Pain Location: back Pain Descriptors / Indicators: Grimacing;Operative site guarding;Sore Pain Intervention(s): Limited activity within patient's tolerance;Monitored during session;Repositioned;Premedicated before session;RN gave pain meds during session     Hand Dominance     Extremity/Trunk Assessment Upper Extremity Assessment Upper Extremity Assessment: Overall WFL for tasks assessed   Lower Extremity Assessment Lower Extremity Assessment: Defer to PT evaluation LLE Deficits / Details: PROM against gravity at knee, ankle, and hip. Pt reports no change in sensation.    Cervical / Trunk Assessment Cervical / Trunk Assessment: Other exceptions Cervical / Trunk Exceptions: s/p spinal surgery   Communication Communication Communication: No difficulties   Cognition Arousal/Alertness: Awake/alert Behavior During Therapy: WFL for tasks assessed/performed Overall Cognitive Status: Within Functional Limits for tasks assessed                                     General Comments  VSS; spouse present and supportive during session    Exercises     Shoulder Instructions      Home Living Family/patient expects to be discharged to:: Private residence Living Arrangements: Spouse/significant other Available Help at Discharge: Family;Available PRN/intermittently (daughter who is PT for 4 days following d/c) Type of Home: House Home Access: Stairs to enter     Home Layout: Two level;Able to live on main level with bedroom/bathroom Alternate Level Stairs-Number of Steps: flight   Bathroom Shower/Tub: Chief Strategy Officer: Standard     Home Equipment: Cane - single point;Walker - 2 wheels;Bedside commode   Additional Comments: recliner pt could sleep in if needed      Prior  Functioning/Environment Level of Independence: Independent with assistive device(s)        Comments: pt reports use of RW or cane as needed, limited by pain         OT Problem List: Decreased strength;Decreased range of motion;Decreased activity tolerance;Impaired balance (sitting and/or standing);Decreased safety awareness;Decreased cognition;Decreased knowledge of use of DME or AE;Decreased knowledge of precautions;Pain      OT Treatment/Interventions: Self-care/ADL training;Therapeutic exercise;Energy conservation;DME and/or AE instruction;Therapeutic activities;Patient/family education;Balance training    OT Goals(Current goals can be found in the care plan section) Acute Rehab OT Goals Patient Stated Goal: reduce pain, hopeful for home OT Goal Formulation: With patient Time For Goal Achievement: 05/03/20 Potential to Achieve Goals: Good  OT Frequency: Min 2X/week   Barriers to D/C:            Co-evaluation PT/OT/SLP Co-Evaluation/Treatment: Yes Reason for Co-Treatment: For patient/therapist safety;To address functional/ADL transfers PT goals addressed during session: Mobility/safety with mobility;Balance;Proper use of DME;Strengthening/ROM OT goals addressed during session: ADL's and self-care      AM-PAC OT "6 Clicks" Daily Activity     Outcome Measure Help from another person eating meals?: None Help from another person taking care of personal grooming?: A Little Help from another person toileting, which includes using toliet, bedpan, or urinal?: A Lot Help from another person bathing (including washing, rinsing, drying)?: A Lot Help from another person to put on and taking off regular upper body clothing?: A Lot Help from another person to put on and taking off regular lower body clothing?: A Lot 6  Click Score: 15   End of Session Equipment Utilized During Treatment: Gait belt;Rolling walker;Back brace Nurse Communication: Mobility status  Activity Tolerance:  Patient tolerated treatment well;Patient limited by pain Patient left: in chair;with call bell/phone within reach;with family/visitor present  OT Visit Diagnosis: Other abnormalities of gait and mobility (R26.89);Pain;Unsteadiness on feet (R26.81) Pain - part of body:  (back)                Time: 5009-3818 OT Time Calculation (min): 33 min Charges:  OT General Charges $OT Visit: 1 Visit OT Evaluation $OT Eval Moderate Complexity: 1 Mod  Marcy Siren, OT Acute Rehabilitation Services Pager [phone] Office [phone]   [name] 04/19/2020, 3:12 PM

## 2020-04-19 NOTE — Progress Notes (Signed)
NEUROSURGERY PROGRESS NOTE  Doing ok, having quite a bit of back pain and difficulty getting up moving around because of it. Stage 1 XLIF yesterday, second stage on Monday. He is having some new left hip flexor weakness, suspect this is from his femoral nerve.   Temp:  [97.9 F (36.6 C)-98.6 F (37 C)] 98.4 F (36.9 C) (10/23 0843) Pulse Rate:  [58-96] 73 (10/23 0843) Resp:  [12-19] 12 (10/23 0843) BP: (114-178)/(52-103) 125/52 (10/23 0843) SpO2:  [93 %-100 %] 93 % (10/23 0843)  Sherryl Manges, NP 04/19/2020 9:44 AM

## 2020-04-20 ENCOUNTER — Inpatient Hospital Stay (HOSPITAL_COMMUNITY): Payer: BC Managed Care – PPO

## 2020-04-20 IMAGING — DX DG SCOLIOSIS EVAL COMPLETE SPINE 2-3V
2 series · 6 of 6 positions shown · non-contrast
Comparison: None.

CLINICAL DATA: Scoliosis with left leg weakness

EXAM:
DG SCOLIOSIS EVAL COMPLETE SPINE 2-3V

[Series 1: whole body ap · 0.14mm/px · 3 of 3 slices shown]
[im 1/3]
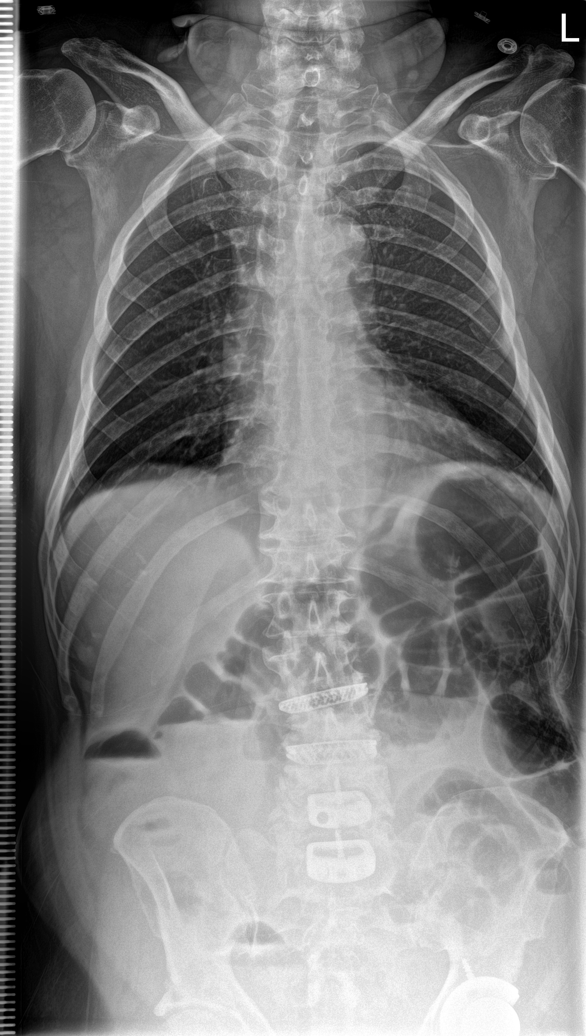
[im 2/3]
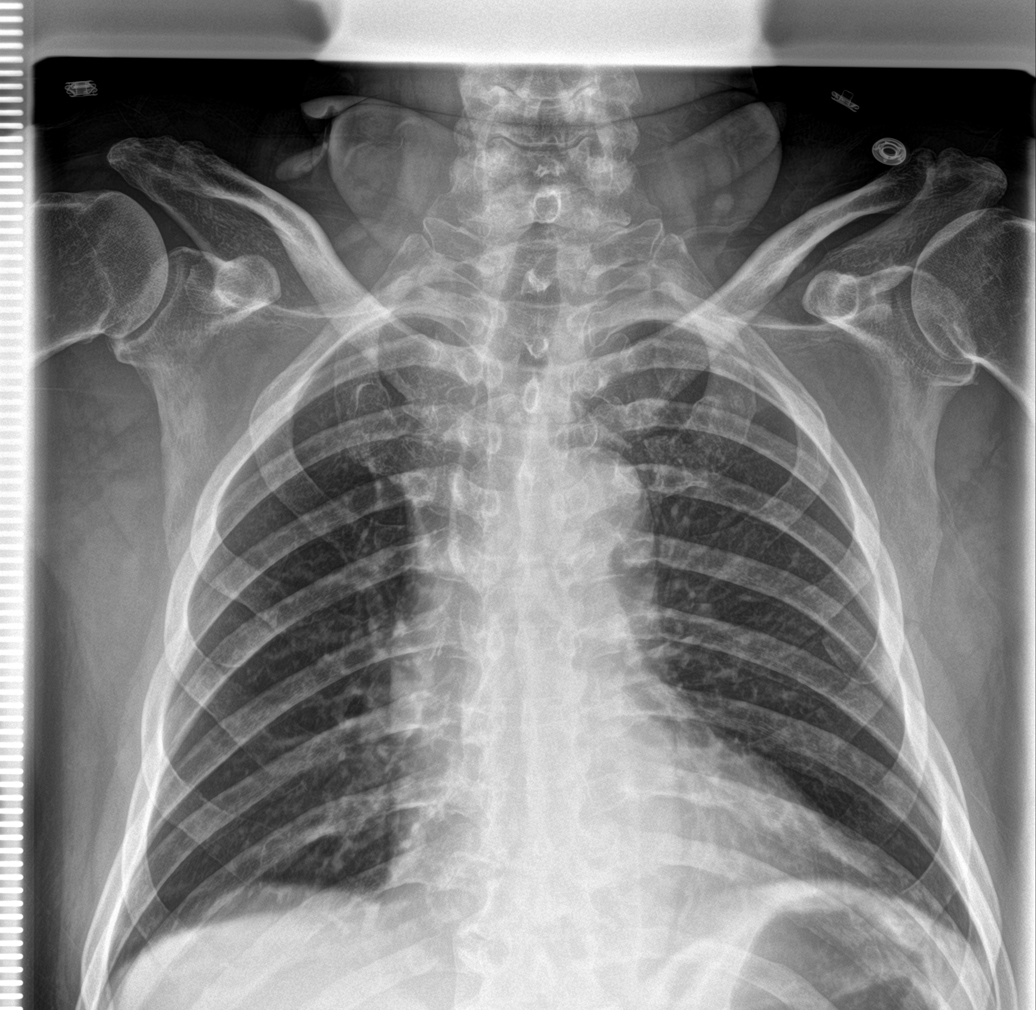
[im 3/3]
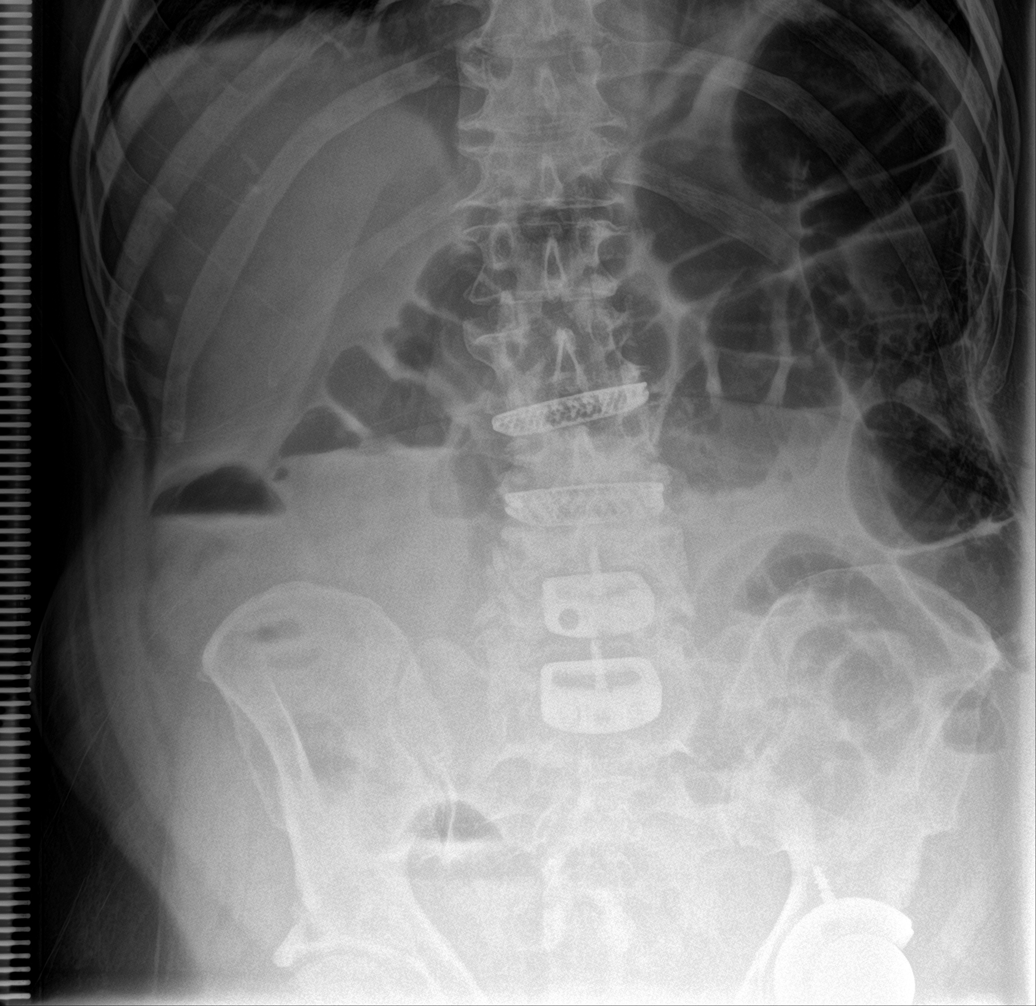

[Series 2: whole body lat · 0.14mm/px · 3 of 3 slices shown]
[im 1/3]
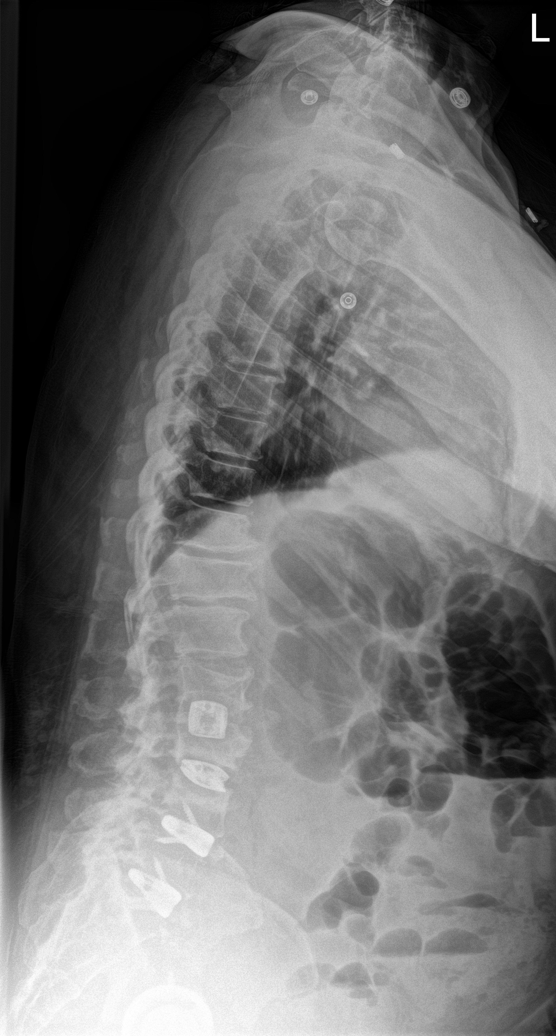
[im 2/3]
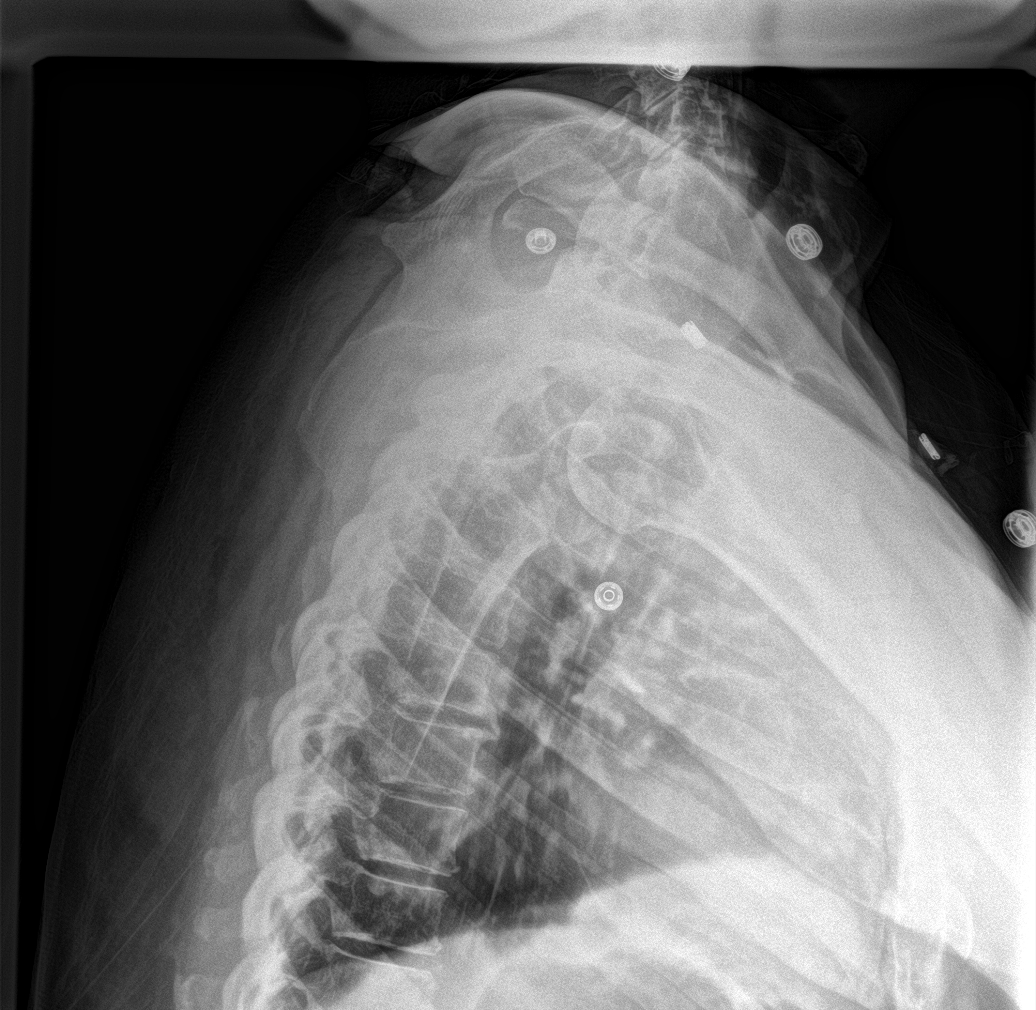
[im 3/3]
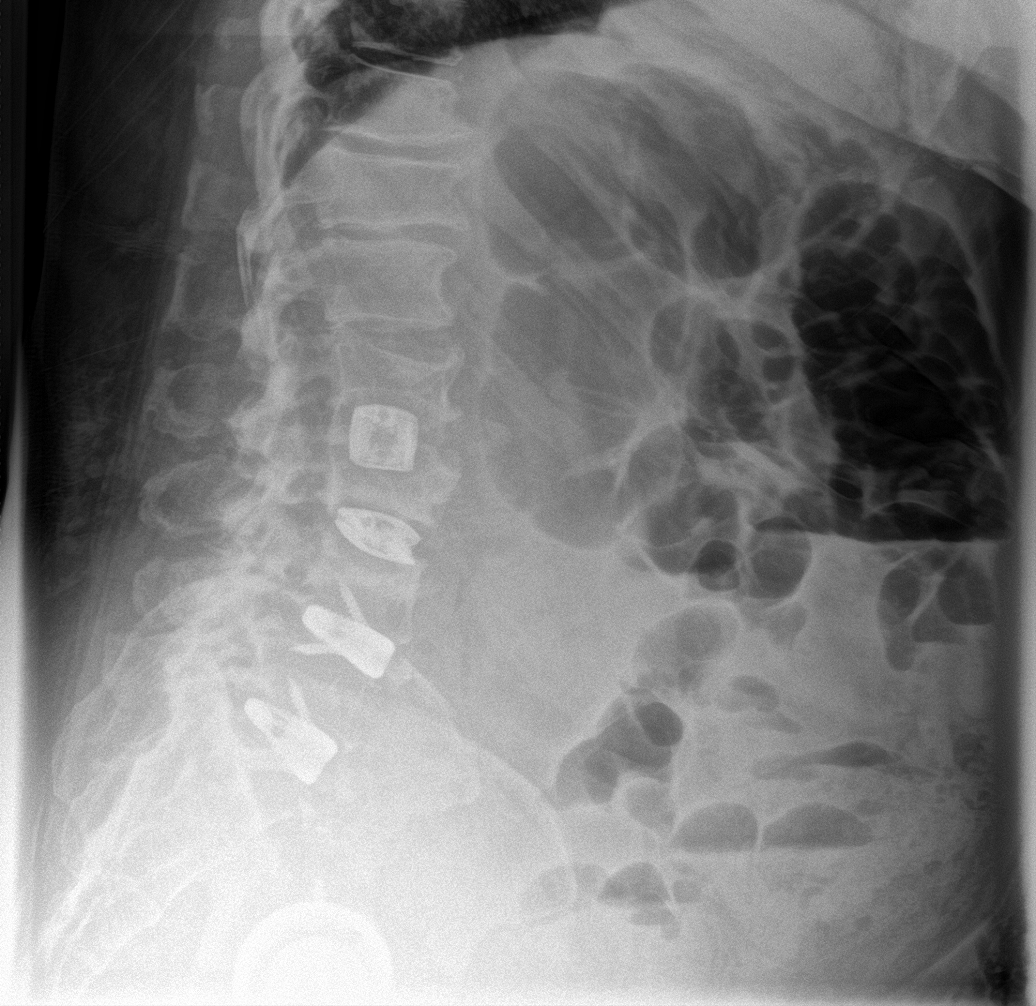

[6 of 6 positions shown; findings below may reference images not displayed]

FINDINGS: There is a minimal leftward curvature of the lumbar spine centered
at L2-L3 of 5 degrees. Interbody fixation devices are noted from L2
through to S1. Slight anterior wedge compression deformities are
noted in the T11 and T12 vertebral bodies.
IMPRESSION: Mild leftward curvature of the lumbar spine centered at L2-L3 of 5
degrees

## 2020-04-20 MED ORDER — CHLORHEXIDINE GLUCONATE CLOTH 2 % EX PADS
6.0000 | MEDICATED_PAD | Freq: Once | CUTANEOUS | Status: AC
  Administered 2020-04-20: 6 via TOPICAL

## 2020-04-20 MED ORDER — OXYCODONE HCL 5 MG PO TABS
10.0000 mg | ORAL_TABLET | ORAL | Status: DC | PRN
  Administered 2020-04-20 – 2020-04-21 (×5): 10 mg via ORAL
  Filled 2020-04-20 (×5): qty 2

## 2020-04-20 MED ORDER — CEFAZOLIN SODIUM-DEXTROSE 2-4 GM/100ML-% IV SOLN
2.0000 g | INTRAVENOUS | Status: AC
  Administered 2020-04-21 (×2): 2 g via INTRAVENOUS
  Filled 2020-04-20: qty 100

## 2020-04-20 MED ORDER — CHLORHEXIDINE GLUCONATE CLOTH 2 % EX PADS
6.0000 | MEDICATED_PAD | Freq: Once | CUTANEOUS | Status: AC
  Administered 2020-04-21: 6 via TOPICAL

## 2020-04-20 NOTE — Progress Notes (Signed)
Pt able to place himself on BIPAP dreamstation on settings of 23/19 w/3 Lpm bled into the system. RT will continue to monitor.

## 2020-04-20 NOTE — Progress Notes (Signed)
[name], male   DOB: January 16, 1951, 69 y.o.   MRN: [id number] Still having significant back pain.  He has left hip flexor weakness that is about 3 out of 5 but this is to be expected.  He has about 4 out of 5 strength in the left knee extensors.  He has a fairly dense foot drop and EHL weakness on the left.  He has trouble telling me whether this was there before surgery or not.  His incisions are clean dry and intact.  Stage II is tomorrow.

## 2020-04-21 ENCOUNTER — Inpatient Hospital Stay (HOSPITAL_COMMUNITY): Payer: BC Managed Care – PPO

## 2020-04-21 ENCOUNTER — Inpatient Hospital Stay (HOSPITAL_COMMUNITY): Payer: BC Managed Care – PPO | Admitting: Certified Registered Nurse Anesthetist

## 2020-04-21 ENCOUNTER — Inpatient Hospital Stay (HOSPITAL_COMMUNITY)
Admission: RE | Disposition: A | Discharge status: Skilled Nursing Facility | Payer: Self-pay | Source: Home / Self Care | Attending: Neurosurgery

## 2020-04-21 ENCOUNTER — Encounter (HOSPITAL_COMMUNITY): Payer: Self-pay | Admitting: Neurosurgery

## 2020-04-21 ENCOUNTER — Inpatient Hospital Stay (HOSPITAL_COMMUNITY): Admission: RE | Admit: 2020-04-21 | Payer: BC Managed Care – PPO | Source: Home / Self Care | Admitting: Neurosurgery

## 2020-04-21 HISTORY — PX: APPLICATION OF INTRAOPERATIVE CT SCAN: SHX6668

## 2020-04-21 HISTORY — PX: POSTERIOR LUMBAR FUSION 4 LEVEL: SHX6037

## 2020-04-21 SURGERY — POSTERIOR LUMBAR FUSION 4 LEVEL
Anesthesia: General | Site: Back

## 2020-04-21 MED ORDER — PHENYLEPHRINE HCL-NACL 10-0.9 MG/250ML-% IV SOLN
INTRAVENOUS | Status: DC | PRN
  Administered 2020-04-21: 20 ug/min via INTRAVENOUS

## 2020-04-21 MED ORDER — HYDROMORPHONE HCL 1 MG/ML IJ SOLN
INTRAMUSCULAR | Status: AC
  Filled 2020-04-21: qty 1

## 2020-04-21 MED ORDER — HEMOSTATIC AGENTS (NO CHARGE) OPTIME
TOPICAL | Status: DC | PRN
  Administered 2020-04-21: 1 via TOPICAL

## 2020-04-21 MED ORDER — SODIUM CHLORIDE 0.9% FLUSH
3.0000 mL | Freq: Two times a day (BID) | INTRAVENOUS | Status: DC
  Administered 2020-04-21 – 2020-05-07 (×28): 3 mL via INTRAVENOUS

## 2020-04-21 MED ORDER — FENTANYL CITRATE (PF) 250 MCG/5ML IJ SOLN
INTRAMUSCULAR | Status: AC
  Filled 2020-04-21: qty 5

## 2020-04-21 MED ORDER — ZOLPIDEM TARTRATE 5 MG PO TABS
5.0000 mg | ORAL_TABLET | Freq: Every evening | ORAL | Status: DC | PRN
  Administered 2020-04-23 – 2020-05-06 (×11): 5 mg via ORAL
  Filled 2020-04-21 (×12): qty 1

## 2020-04-21 MED ORDER — ACETAMINOPHEN 325 MG PO TABS
650.0000 mg | ORAL_TABLET | ORAL | Status: DC | PRN
  Administered 2020-05-02 – 2020-05-03 (×3): 650 mg via ORAL
  Filled 2020-04-21 (×3): qty 2

## 2020-04-21 MED ORDER — POLYETHYLENE GLYCOL 3350 17 G PO PACK
17.0000 g | PACK | Freq: Every day | ORAL | Status: DC | PRN
  Administered 2020-05-02 – 2020-05-03 (×2): 17 g via ORAL

## 2020-04-21 MED ORDER — HYDROMORPHONE HCL 1 MG/ML IJ SOLN
0.5000 mg | INTRAMUSCULAR | Status: DC | PRN
  Administered 2020-04-22 (×2): 0.5 mg via INTRAVENOUS
  Filled 2020-04-21 (×2): qty 1

## 2020-04-21 MED ORDER — ROCURONIUM BROMIDE 10 MG/ML (PF) SYRINGE
PREFILLED_SYRINGE | INTRAVENOUS | Status: DC | PRN
  Administered 2020-04-21 (×2): 20 mg via INTRAVENOUS
  Administered 2020-04-21: 100 mg via INTRAVENOUS
  Administered 2020-04-21 (×3): 20 mg via INTRAVENOUS

## 2020-04-21 MED ORDER — SODIUM CHLORIDE 0.9 % IV SOLN: 250.0000 mL | INTRAVENOUS | Status: DC

## 2020-04-21 MED ORDER — METHOCARBAMOL 500 MG PO TABS
500.0000 mg | ORAL_TABLET | Freq: Four times a day (QID) | ORAL | Status: DC | PRN
  Administered 2020-04-21 – 2020-05-04 (×22): 500 mg via ORAL
  Filled 2020-04-21 (×22): qty 1

## 2020-04-21 MED ORDER — KCL IN DEXTROSE-NACL 20-5-0.45 MEQ/L-%-% IV SOLN
INTRAVENOUS | Status: DC
  Filled 2020-04-21 (×3): qty 1000

## 2020-04-21 MED ORDER — HYDROMORPHONE HCL 1 MG/ML IJ SOLN
0.2500 mg | INTRAMUSCULAR | Status: DC | PRN
  Administered 2020-04-21: 0.5 mg via INTRAVENOUS

## 2020-04-21 MED ORDER — PHENOL 1.4 % MT LIQD: 1.0000 | OROMUCOSAL | Status: DC | PRN

## 2020-04-21 MED ORDER — HYDROCODONE-ACETAMINOPHEN 5-325 MG PO TABS
2.0000 | ORAL_TABLET | ORAL | Status: DC | PRN
  Administered 2020-04-21 – 2020-05-07 (×39): 2 via ORAL
  Filled 2020-04-21 (×40): qty 2

## 2020-04-21 MED ORDER — ONDANSETRON HCL 4 MG PO TABS: 4.0000 mg | ORAL_TABLET | Freq: Four times a day (QID) | ORAL | Status: DC | PRN

## 2020-04-21 MED ORDER — MENTHOL 3 MG MT LOZG: 1.0000 | LOZENGE | OROMUCOSAL | Status: DC | PRN

## 2020-04-21 MED ORDER — ONDANSETRON HCL 4 MG/2ML IJ SOLN: 4.0000 mg | Freq: Once | INTRAMUSCULAR | Status: DC | PRN

## 2020-04-21 MED ORDER — OXYCODONE HCL 5 MG PO TABS
5.0000 mg | ORAL_TABLET | ORAL | Status: DC | PRN
  Administered 2020-04-22: 5 mg via ORAL
  Filled 2020-04-21 (×2): qty 1

## 2020-04-21 MED ORDER — MEPERIDINE HCL 25 MG/ML IJ SOLN: 6.2500 mg | INTRAMUSCULAR | Status: DC | PRN

## 2020-04-21 MED ORDER — ROCURONIUM BROMIDE 10 MG/ML (PF) SYRINGE
PREFILLED_SYRINGE | INTRAVENOUS | Status: AC
  Filled 2020-04-21: qty 10

## 2020-04-21 MED ORDER — THROMBIN 5000 UNITS EX SOLR
CUTANEOUS | Status: AC
  Filled 2020-04-21: qty 5000

## 2020-04-21 MED ORDER — BUPIVACAINE HCL (PF) 0.5 % IJ SOLN
INTRAMUSCULAR | Status: DC | PRN
  Administered 2020-04-21: 15 mL

## 2020-04-21 MED ORDER — DEXAMETHASONE SODIUM PHOSPHATE 10 MG/ML IJ SOLN
INTRAMUSCULAR | Status: AC
  Filled 2020-04-21: qty 1

## 2020-04-21 MED ORDER — ALUM & MAG HYDROXIDE-SIMETH 200-200-20 MG/5ML PO SUSP: 30.0000 mL | Freq: Four times a day (QID) | ORAL | Status: DC | PRN

## 2020-04-21 MED ORDER — LACTATED RINGERS IV SOLN: INTRAVENOUS | Status: DC | PRN

## 2020-04-21 MED ORDER — PANTOPRAZOLE SODIUM 40 MG IV SOLR
40.0000 mg | Freq: Every day | INTRAVENOUS | Status: DC
  Administered 2020-04-21 – 2020-04-23 (×3): 40 mg via INTRAVENOUS
  Filled 2020-04-21 (×3): qty 40

## 2020-04-21 MED ORDER — CEFAZOLIN SODIUM 1 G IJ SOLR
INTRAMUSCULAR | Status: AC
  Filled 2020-04-21: qty 20

## 2020-04-21 MED ORDER — PROPOFOL 10 MG/ML IV BOLUS
INTRAVENOUS | Status: DC | PRN
  Administered 2020-04-21: 110 mg via INTRAVENOUS

## 2020-04-21 MED ORDER — THROMBIN 5000 UNITS EX SOLR
OROMUCOSAL | Status: DC | PRN
  Administered 2020-04-21 (×2): 5 mL via TOPICAL

## 2020-04-21 MED ORDER — MIDAZOLAM HCL 2 MG/2ML IJ SOLN
INTRAMUSCULAR | Status: DC | PRN
  Administered 2020-04-21: 2 mg via INTRAVENOUS

## 2020-04-21 MED ORDER — BUPIVACAINE HCL (PF) 0.5 % IJ SOLN
INTRAMUSCULAR | Status: AC
  Filled 2020-04-21: qty 30

## 2020-04-21 MED ORDER — ACETAMINOPHEN 650 MG RE SUPP: 650.0000 mg | RECTAL | Status: DC | PRN

## 2020-04-21 MED ORDER — DOCUSATE SODIUM 100 MG PO CAPS
100.0000 mg | ORAL_CAPSULE | Freq: Two times a day (BID) | ORAL | Status: DC
  Administered 2020-04-21 – 2020-05-07 (×31): 100 mg via ORAL
  Filled 2020-04-21 (×33): qty 1

## 2020-04-21 MED ORDER — ONDANSETRON HCL 4 MG/2ML IJ SOLN
INTRAMUSCULAR | Status: DC | PRN
  Administered 2020-04-21: 4 mg via INTRAVENOUS

## 2020-04-21 MED ORDER — LIDOCAINE 2% (20 MG/ML) 5 ML SYRINGE
INTRAMUSCULAR | Status: DC | PRN
  Administered 2020-04-21: 100 mg via INTRAVENOUS

## 2020-04-21 MED ORDER — ONDANSETRON HCL 4 MG/2ML IJ SOLN
INTRAMUSCULAR | Status: AC
  Filled 2020-04-21: qty 2

## 2020-04-21 MED ORDER — CEFAZOLIN SODIUM-DEXTROSE 2-4 GM/100ML-% IV SOLN
2.0000 g | Freq: Three times a day (TID) | INTRAVENOUS | Status: AC
  Administered 2020-04-21 – 2020-04-22 (×2): 2 g via INTRAVENOUS
  Filled 2020-04-21 (×2): qty 100

## 2020-04-21 MED ORDER — SUGAMMADEX SODIUM 200 MG/2ML IV SOLN
INTRAVENOUS | Status: DC | PRN
  Administered 2020-04-21: 200 mg via INTRAVENOUS

## 2020-04-21 MED ORDER — DEXAMETHASONE SODIUM PHOSPHATE 10 MG/ML IJ SOLN
INTRAMUSCULAR | Status: DC | PRN
  Administered 2020-04-21: 10 mg via INTRAVENOUS

## 2020-04-21 MED ORDER — SUGAMMADEX SODIUM 500 MG/5ML IV SOLN
INTRAVENOUS | Status: AC
  Filled 2020-04-21: qty 5

## 2020-04-21 MED ORDER — ONDANSETRON HCL 4 MG/2ML IJ SOLN: 4.0000 mg | Freq: Four times a day (QID) | INTRAMUSCULAR | Status: DC | PRN

## 2020-04-21 MED ORDER — LIDOCAINE-EPINEPHRINE 1 %-1:100000 IJ SOLN
INTRAMUSCULAR | Status: DC | PRN
  Administered 2020-04-21: 15 mL

## 2020-04-21 MED ORDER — ALBUMIN HUMAN 5 % IV SOLN: INTRAVENOUS | Status: DC | PRN

## 2020-04-21 MED ORDER — BISACODYL 10 MG RE SUPP
10.0000 mg | Freq: Every day | RECTAL | Status: DC | PRN
  Administered 2020-04-23: 10 mg via RECTAL
  Filled 2020-04-21 (×2): qty 1

## 2020-04-21 MED ORDER — FLEET ENEMA 7-19 GM/118ML RE ENEM
1.0000 | ENEMA | Freq: Once | RECTAL | Status: AC | PRN
  Administered 2020-04-23: 1 via RECTAL
  Filled 2020-04-21: qty 1

## 2020-04-21 MED ORDER — METHOCARBAMOL 1000 MG/10ML IJ SOLN
500.0000 mg | Freq: Four times a day (QID) | INTRAVENOUS | Status: DC | PRN
  Filled 2020-04-21: qty 5

## 2020-04-21 MED ORDER — FENTANYL CITRATE (PF) 100 MCG/2ML IJ SOLN
INTRAMUSCULAR | Status: DC | PRN
  Administered 2020-04-21: 50 ug via INTRAVENOUS
  Administered 2020-04-21: 25 ug via INTRAVENOUS
  Administered 2020-04-21: 50 ug via INTRAVENOUS
  Administered 2020-04-21 (×3): 25 ug via INTRAVENOUS
  Administered 2020-04-21: 50 ug via INTRAVENOUS
  Administered 2020-04-21: 25 ug via INTRAVENOUS
  Administered 2020-04-21: 150 ug via INTRAVENOUS
  Administered 2020-04-21: 50 ug via INTRAVENOUS
  Administered 2020-04-21: 25 ug via INTRAVENOUS
  Administered 2020-04-21: 100 ug via INTRAVENOUS
  Administered 2020-04-21: 50 ug via INTRAVENOUS

## 2020-04-21 MED ORDER — PHENYLEPHRINE 40 MCG/ML (10ML) SYRINGE FOR IV PUSH (FOR BLOOD PRESSURE SUPPORT)
PREFILLED_SYRINGE | INTRAVENOUS | Status: DC | PRN
  Administered 2020-04-21 (×2): 120 ug via INTRAVENOUS

## 2020-04-21 MED ORDER — BUPIVACAINE LIPOSOME 1.3 % IJ SUSP
20.0000 mL | Freq: Once | INTRAMUSCULAR | Status: AC
  Administered 2020-04-21: 20 mL
  Filled 2020-04-21: qty 20

## 2020-04-21 MED ORDER — SODIUM CHLORIDE 0.9% FLUSH: 3.0000 mL | INTRAVENOUS | Status: DC | PRN

## 2020-04-21 MED ORDER — 0.9 % SODIUM CHLORIDE (POUR BTL) OPTIME
TOPICAL | Status: DC | PRN
  Administered 2020-04-21 (×2): 1000 mL

## 2020-04-21 MED ORDER — THROMBIN 5000 UNITS EX SOLR
CUTANEOUS | Status: AC
  Filled 2020-04-21: qty 10000

## 2020-04-21 MED ORDER — TRANEXAMIC ACID 1000 MG/10ML IV SOLN
2000.0000 mg | Freq: Once | INTRAVENOUS | Status: DC
  Filled 2020-04-21: qty 20

## 2020-04-21 MED ORDER — MIDAZOLAM HCL 2 MG/2ML IJ SOLN
INTRAMUSCULAR | Status: AC
  Filled 2020-04-21: qty 2

## 2020-04-21 MED ORDER — LIDOCAINE-EPINEPHRINE 1 %-1:100000 IJ SOLN
INTRAMUSCULAR | Status: AC
  Filled 2020-04-21: qty 1

## 2020-04-21 MED ORDER — LIDOCAINE 2% (20 MG/ML) 5 ML SYRINGE
INTRAMUSCULAR | Status: AC
  Filled 2020-04-21: qty 5

## 2020-04-21 SURGICAL SUPPLY — 90 items
BASKET BONE COLLECTION (BASKET) ×4 IMPLANT
BLADE CLIPPER SURG (BLADE) ×4 IMPLANT
BONE CANC CHIPS 40CC CAN1/2 (Bone Implant) ×8 IMPLANT
BUR MATCHSTICK NEURO 3.0 LAGG (BURR) ×4 IMPLANT
BUR PRECISION FLUTE 5.0 (BURR) ×4 IMPLANT
CANISTER SUCT 3000ML PPV (MISCELLANEOUS) ×4 IMPLANT
CARTRIDGE OIL MAESTRO DRILL (MISCELLANEOUS) ×2 IMPLANT
CHIPS CANC BONE 40CC CAN1/2 (Bone Implant) ×4 IMPLANT
CNTNR URN SCR LID CUP LEK RST (MISCELLANEOUS) ×2 IMPLANT
CONT SPEC 4OZ STRL OR WHT (MISCELLANEOUS) ×2
COVER BACK TABLE 24X17X13 BIG (DRAPES) IMPLANT
COVER BACK TABLE 60X90IN (DRAPES) ×4 IMPLANT
COVER WAND RF STERILE (DRAPES) ×4 IMPLANT
DECANTER SPIKE VIAL GLASS SM (MISCELLANEOUS) ×4 IMPLANT
DERMABOND ADVANCED (GAUZE/BANDAGES/DRESSINGS) ×2
DERMABOND ADVANCED .7 DNX12 (GAUZE/BANDAGES/DRESSINGS) ×2 IMPLANT
DIFFUSER DRILL AIR PNEUMATIC (MISCELLANEOUS) ×4 IMPLANT
DIGITIZER BENDINI (MISCELLANEOUS) ×4 IMPLANT
DRAPE C-ARM 42X72 X-RAY (DRAPES) IMPLANT
DRAPE C-ARMOR (DRAPES) IMPLANT
DRAPE LAPAROTOMY 100X72X124 (DRAPES) ×4 IMPLANT
DRAPE SCAN PATIENT (DRAPES) ×12 IMPLANT
DRAPE SURG 17X23 STRL (DRAPES) ×4 IMPLANT
DRSG OPSITE POSTOP 4X10 (GAUZE/BANDAGES/DRESSINGS) ×8 IMPLANT
DURAPREP 26ML APPLICATOR (WOUND CARE) ×4 IMPLANT
ELECT BLADE 4.0 EZ CLEAN MEGAD (MISCELLANEOUS) ×4
ELECT REM PT RETURN 9FT ADLT (ELECTROSURGICAL) ×4
ELECTRODE BLDE 4.0 EZ CLN MEGD (MISCELLANEOUS) ×2 IMPLANT
ELECTRODE REM PT RTRN 9FT ADLT (ELECTROSURGICAL) ×2 IMPLANT
EVACUATOR 1/8 PVC DRAIN (DRAIN) ×4 IMPLANT
GAUZE 4X4 16PLY RFD (DISPOSABLE) IMPLANT
GAUZE SPONGE 4X4 12PLY STRL (GAUZE/BANDAGES/DRESSINGS) IMPLANT
GAUZE SPONGE 4X4 16PLY XRAY LF (GAUZE/BANDAGES/DRESSINGS) ×8 IMPLANT
GLOVE BIO SURGEON STRL SZ8 (GLOVE) ×12 IMPLANT
GLOVE BIOGEL PI IND STRL 7.0 (GLOVE) ×4 IMPLANT
GLOVE BIOGEL PI IND STRL 7.5 (GLOVE) ×4 IMPLANT
GLOVE BIOGEL PI IND STRL 8 (GLOVE) ×4 IMPLANT
GLOVE BIOGEL PI IND STRL 8.5 (GLOVE) ×12 IMPLANT
GLOVE BIOGEL PI INDICATOR 7.0 (GLOVE) ×4
GLOVE BIOGEL PI INDICATOR 7.5 (GLOVE) ×4
GLOVE BIOGEL PI INDICATOR 8 (GLOVE) ×4
GLOVE BIOGEL PI INDICATOR 8.5 (GLOVE) ×12
GLOVE ECLIPSE 8.0 STRL XLNG CF (GLOVE) ×8 IMPLANT
GLOVE EXAM NITRILE XL STR (GLOVE) IMPLANT
GLOVE SURG SS PI 7.5 STRL IVOR (GLOVE) ×20 IMPLANT
GOWN STRL REUS W/ TWL LRG LVL3 (GOWN DISPOSABLE) IMPLANT
GOWN STRL REUS W/ TWL XL LVL3 (GOWN DISPOSABLE) ×16 IMPLANT
GOWN STRL REUS W/TWL 2XL LVL3 (GOWN DISPOSABLE) ×8 IMPLANT
GOWN STRL REUS W/TWL LRG LVL3 (GOWN DISPOSABLE)
GOWN STRL REUS W/TWL XL LVL3 (GOWN DISPOSABLE) ×16
GRAFT BONE PROTEIOS MED 2.5CC (Orthopedic Implant) ×4 IMPLANT
HEMOSTAT POWDER KIT SURGIFOAM (HEMOSTASIS) ×8 IMPLANT
KIT BASIN OR (CUSTOM PROCEDURE TRAY) ×4 IMPLANT
KIT POSITION SURG JACKSON T1 (MISCELLANEOUS) ×4 IMPLANT
KIT TURNOVER KIT B (KITS) ×4 IMPLANT
MARKER SKIN DUAL TIP RULER LAB (MISCELLANEOUS) ×4 IMPLANT
MARKER SPHERE PSV REFLC 13MM (MARKER) ×24 IMPLANT
MILL MEDIUM DISP (BLADE) ×4 IMPLANT
MODULE POWER NUVASIVE (MISCELLANEOUS) ×2 IMPLANT
NEEDLE HYPO 25X1 1.5 SAFETY (NEEDLE) ×4 IMPLANT
NEEDLE SPNL 18GX3.5 QUINCKE PK (NEEDLE) IMPLANT
NS IRRIG 1000ML POUR BTL (IV SOLUTION) ×8 IMPLANT
OIL CARTRIDGE MAESTRO DRILL (MISCELLANEOUS) ×4
PACK LAMINECTOMY NEURO (CUSTOM PROCEDURE TRAY) ×4 IMPLANT
PAD ARMBOARD 7.5X6 YLW CONV (MISCELLANEOUS) ×20 IMPLANT
PATTIES SURGICAL .5 X.5 (GAUZE/BANDAGES/DRESSINGS) IMPLANT
PATTIES SURGICAL .5 X1 (DISPOSABLE) IMPLANT
PATTIES SURGICAL 1X1 (DISPOSABLE) IMPLANT
POWER MODULE NUVASIVE (MISCELLANEOUS) ×4
ROD RELINE 5.5X500MM STRAIGHT (Rod) ×8 IMPLANT
SCREW LOCK RELINE 5.5 TULIP (Screw) ×80 IMPLANT
SCREW RELINE POLY 5.5X50MM (Screw) ×24 IMPLANT
SCREW RELINE-O POLY 5.5X45MM (Screw) ×8 IMPLANT
SCREW RELINE-O POLY 6.5X40 (Screw) ×4 IMPLANT
SCREW RELINE-O POLY 6.5X45 (Screw) ×28 IMPLANT
SCREW RELINE-O POLY 7.5X45 (Screw) ×8 IMPLANT
SCREW RELINE-O POLY 8.5C70MM (Screw) ×8 IMPLANT
SPONGE LAP 4X18 RFD (DISPOSABLE) ×4 IMPLANT
SPONGE SURGIFOAM ABS GEL 100 (HEMOSTASIS) IMPLANT
STAPLER SKIN PROX WIDE 3.9 (STAPLE) ×4 IMPLANT
SUT VIC AB 1 CT1 18XBRD ANBCTR (SUTURE) ×6 IMPLANT
SUT VIC AB 1 CT1 8-18 (SUTURE) ×6
SUT VIC AB 2-0 CT1 18 (SUTURE) ×16 IMPLANT
SUT VIC AB 3-0 SH 8-18 (SUTURE) ×16 IMPLANT
SYR 5ML LL (SYRINGE) IMPLANT
SYR TB 1ML 25GX5/8 (SYRINGE) IMPLANT
TOWEL GREEN STERILE (TOWEL DISPOSABLE) ×4 IMPLANT
TOWEL GREEN STERILE FF (TOWEL DISPOSABLE) ×4 IMPLANT
TRAY FOLEY MTR SLVR 16FR STAT (SET/KITS/TRAYS/PACK) IMPLANT
WATER STERILE IRR 1000ML POUR (IV SOLUTION) ×4 IMPLANT

## 2020-04-21 NOTE — Anesthesia Procedure Notes (Signed)
Procedure Name: Intubation Date/Time: 04/21/2020 7:50 AM Performed by: Pearson Grippe, CRNA Pre-anesthesia Checklist: Patient identified, Emergency Drugs available, Suction available and Patient being monitored Patient Re-evaluated:Patient Re-evaluated prior to induction Oxygen Delivery Method: Circle system utilized Preoxygenation: Pre-oxygenation with 100% oxygen Induction Type: IV induction Ventilation: Mask ventilation without difficulty Laryngoscope Size: Miller and 2 Grade View: Grade I Tube type: Oral Tube size: 7.5 mm Number of attempts: 1 Airway Equipment and Method: Stylet Placement Confirmation: ETT inserted through vocal cords under direct vision,  positive ETCO2 and breath sounds checked- equal and bilateral Secured at: 22 cm Tube secured with: Tape Dental Injury: Teeth and Oropharynx as per pre-operative assessment

## 2020-04-21 NOTE — Interval H&P Note (Signed)
History and Physical Interval Note:  04/21/2020 7:30 AM  Marco Kennedy  has presented today for surgery, with the diagnosis of Scoliosis and kyphoscoliosis, Idiopathic.  The various methods of treatment have been discussed with the patient and family. After consideration of risks, benefits and other options for treatment, the patient has consented to  Procedure(s): Thoracic 10 to pelvis pedicle screw fixation with possible osteotomies (N/A) LUMBAR PERCUTANEOUS PEDICLE SCREW 4 LEVEL (N/A) APPLICATION OF INTRAOPERATIVE CT SCAN (N/A) as a surgical intervention.  The patient's history has been reviewed, patient examined, no change in status, stable for surgery.  I have reviewed the patient's chart and labs.  Questions were answered to the patient's satisfaction.     [name]

## 2020-04-21 NOTE — Progress Notes (Signed)
Patient is awake and conversant.  MAEW with good strength except for some mild left DF weakness of long-standing.  Left HF was not tested, but has also been weak (expected after multi-level trans-psoas approach).  Patient is doing well.

## 2020-04-21 NOTE — Transfer of Care (Signed)
Immediate Anesthesia Transfer of Care Note  Patient: Marco Kennedy  Procedure(s) Performed: Thoracic ten to pelvis pedicle screw fixation with osteotomies (N/A Back) APPLICATION OF INTRAOPERATIVE CT SCAN (N/A Back)  Patient Location: PACU  Anesthesia Type:General  Level of Consciousness: drowsy and patient cooperative  Airway & Oxygen Therapy: Patient Spontanous Breathing and Patient connected to face mask oxygen  Post-op Assessment: Report given to RN and Post -op Vital signs reviewed and stable  Post vital signs: Reviewed and stable  Last Vitals:  Vitals Value Taken Time  BP 131/103 04/21/20 1459  Temp    Pulse 87 04/21/20 1459  Resp 18 04/21/20 1459  SpO2 100 % 04/21/20 1459  Vitals shown include unvalidated device data.  Last Pain:  Vitals:   04/21/20 0547  TempSrc: Oral  PainSc:       Patients Stated Pain Goal: 4 (04/20/20 0757)  Complications: No complications documented.

## 2020-04-21 NOTE — Op Note (Signed)
04/21/2020  3:19 PM  PATIENT:  Marco Kennedy  69 y.o. male  PRE-OPERATIVE DIAGNOSIS:  Scoliosis and kyphoscoliosis, Idiopathic, lumbar stenosis, spondylolisthesis, lumbago, radiculopathy  POST-OPERATIVE DIAGNOSIS:  Scoliosis and kyphoscoliosis, Idiopathic, lumbar stenosis, spondylolisthesis, lumbago, radiculopathy  PROCEDURE:  Procedure(s): Thoracic ten to pelvis pedicle screw fixation with osteotomies (N/A) APPLICATION OF INTRAOPERATIVE CT SCAN (N/A) posterolateral arthrodesis with autograft and allograft  SURGEON:  Surgeon(s) and Role:    [name], MD - Primary  PHYSICIAN ASSISTANT: Julien Girt, NP  ASSISTANTS: Poteat, RN   ANESTHESIA:   general  EBL:  750 mL   BLOOD ADMINISTERED:350 CC PRBC  DRAINS: (Medium) Hemovact drain(s) in the epidural space with  Suction Open   LOCAL MEDICATIONS USED:  MARCAINE    and LIDOCAINE   SPECIMEN:  No Specimen  DISPOSITION OF SPECIMEN:  N/A  COUNTS:  YES  TOURNIQUET:  * No tourniquets in log *  DICTATION: Patient is 69 year old man who has undergone Stage I ALIF and XLIF three days prior who now presents for Stage II decompression and fusion T 10 to Ilium.  It was elected to take him to surgery for fusion from T 10 through ilium.  H has kyphoscoliosis with significant persistent sagittal imbalance despite anterior and lateral correction done in Stage 1 procedure.   Procedure: Patient was placed in a prone position on the Devol table with Airo after smooth and uncomplicated induction of general endotracheal anesthesia. His back was shaved and prepped and draped in usual sterile fashion with betadine scrub and DuraPrep. Area of incision was infiltrated with local lidocaine. Incision was made to the lumbodorsal fascia was incised and exposure was performed of the T 10 to the ilium bilaterally. The posterolateral region was extensively decorticated and pedicle screws were placed at T 10  through ilium bilaterally using intraoperative  navigation with Airo intraoperative CT scanner.  At T 10 5.5 x 45 bilaterally, 11 6.5 x 45 bilaterally, T 12, L 1, L 2  : 5.5 x 50 bilaterally, L 3, L4, L 5 6.5 x 45 bilaterally; S 1 : 7.5 x 45 bilaterally; s 2 a I:   8.5 x 70 left and 8.5 x 70 right.  All screws were confirmed with CT.  The left S 2 A I screw had a medial breach and was repositioned.  Subsequent CT showed all screws were well-positioned. A Smith Peterson osteotomy of L 34  was performed and bilateral facetectomies with decompression of thecal sac and nerve roots was performed. Bendini rod bending system was utilized and two 295 mm rods were bent, affixed to screw heads and locked down with 12 degrees of additional correction. Reduction towers were used to increase lordosis.   Autograft was harvested from osteotomies for use in fusion and the spinous processes and laminae and facetectomies along with marrow rich blood. We compressed the construct at L 34 and L 45 levels.  Rods were cut, bent and locked down bilaterally in situ and the posterolateral region was packed with autograft, 2.5 cc Proteios and 80 cc bone chips after decorticating the posterolateral region. The wound was irrigated. A medium Hemovac drain was placed. Long-acting Marcaine was injected into the paraspinous musculature.  Fascia was closed with 1 Vicryl sutures skin edges were reapproximated 2 and 3-0 Vicryl sutures. The wound was dressed with Dermabond and an occlusive dressing.  the patient was extubated in the operating room and taken to recovery in stable satisfactory condition having  tolerated the operation well counts were correct  at the end of the case.  At the conclusion of the surgery, preoperative PI-LL had been corrected from + 43  To less than  + 10.  PLAN OF CARE: Admit to inpatient   PATIENT DISPOSITION:  PACU - hemodynamically stable.   Delay start of Pharmacological VTE agent (>24hrs) due to surgical blood loss or risk of bleeding: yes  

## 2020-04-21 NOTE — Brief Op Note (Signed)
04/21/2020  3:19 PM  PATIENT:  Marco Kennedy  69 y.o. male  PRE-OPERATIVE DIAGNOSIS:  Scoliosis and kyphoscoliosis, Idiopathic, lumbar stenosis, spondylolisthesis, lumbago, radiculopathy  POST-OPERATIVE DIAGNOSIS:  Scoliosis and kyphoscoliosis, Idiopathic, lumbar stenosis, spondylolisthesis, lumbago, radiculopathy  PROCEDURE:  Procedure(s): Thoracic ten to pelvis pedicle screw fixation with osteotomies (N/A) APPLICATION OF INTRAOPERATIVE CT SCAN (N/A) posterolateral arthrodesis with autograft and allograft  SURGEON:  Surgeon(s) and Role:    [name], MD - Primary  PHYSICIAN ASSISTANT: Julien Girt, NP  ASSISTANTS: Poteat, RN   ANESTHESIA:   general  EBL:  750 mL   BLOOD ADMINISTERED:350 CC PRBC  DRAINS: (Medium) Hemovact drain(s) in the epidural space with  Suction Open   LOCAL MEDICATIONS USED:  MARCAINE    and LIDOCAINE   SPECIMEN:  No Specimen  DISPOSITION OF SPECIMEN:  N/A  COUNTS:  YES  TOURNIQUET:  * No tourniquets in log *  DICTATION: Patient is 69 year old man who has undergone Stage I ALIF and XLIF three days prior who now presents for Stage II decompression and fusion T 10 to Ilium.  It was elected to take him to surgery for fusion from T 10 through ilium.  H has kyphoscoliosis with significant persistent sagittal imbalance despite anterior and lateral correction done in Stage 1 procedure.   Procedure: Patient was placed in a prone position on the Devol table with Airo after smooth and uncomplicated induction of general endotracheal anesthesia. His back was shaved and prepped and draped in usual sterile fashion with betadine scrub and DuraPrep. Area of incision was infiltrated with local lidocaine. Incision was made to the lumbodorsal fascia was incised and exposure was performed of the T 10 to the ilium bilaterally. The posterolateral region was extensively decorticated and pedicle screws were placed at T 10  through ilium bilaterally using intraoperative  navigation with Airo intraoperative CT scanner.  At T 10 5.5 x 45 bilaterally, 11 6.5 x 45 bilaterally, T 12, L 1, L 2  : 5.5 x 50 bilaterally, L 3, L4, L 5 6.5 x 45 bilaterally; S 1 : 7.5 x 45 bilaterally; s 2 a I:   8.5 x 70 left and 8.5 x 70 right.  All screws were confirmed with CT.  The left S 2 A I screw had a medial breach and was repositioned.  Subsequent CT showed all screws were well-positioned. A Smith Peterson osteotomy of L 34  was performed and bilateral facetectomies with decompression of thecal sac and nerve roots was performed. Bendini rod bending system was utilized and two 295 mm rods were bent, affixed to screw heads and locked down with 12 degrees of additional correction. Reduction towers were used to increase lordosis.   Autograft was harvested from osteotomies for use in fusion and the spinous processes and laminae and facetectomies along with marrow rich blood. We compressed the construct at L 34 and L 45 levels.  Rods were cut, bent and locked down bilaterally in situ and the posterolateral region was packed with autograft, 2.5 cc Proteios and 80 cc bone chips after decorticating the posterolateral region. The wound was irrigated. A medium Hemovac drain was placed. Long-acting Marcaine was injected into the paraspinous musculature.  Fascia was closed with 1 Vicryl sutures skin edges were reapproximated 2 and 3-0 Vicryl sutures. The wound was dressed with Dermabond and an occlusive dressing.  the patient was extubated in the operating room and taken to recovery in stable satisfactory condition having  tolerated the operation well counts were correct  at the end of the case.  At the conclusion of the surgery, preoperative PI-LL had been corrected from + 43  To less than  + 10.  PLAN OF CARE: Admit to inpatient   PATIENT DISPOSITION:  PACU - hemodynamically stable.   Delay start of Pharmacological VTE agent (>24hrs) due to surgical blood loss or risk of bleeding: yes

## 2020-04-21 NOTE — Progress Notes (Signed)
[name], male   DOB: [birth year], 69 y.o.   MRN: [id number] Awake and following commands all extremities. Longstanding left dorsiflexion weakness persists. Left hip flexor limited by pain.  Pt reports generalized soreness following ~6hr surgery without severe pain.

## 2020-04-21 NOTE — Anesthesia Preprocedure Evaluation (Signed)
Anesthesia Evaluation  Patient identified by MRN, date of birth, ID band Patient awake    Reviewed: Allergy & Precautions, NPO status , Patient's Chart, lab work & pertinent test results  Airway Mallampati: I  TM Distance: >3 FB Neck ROM: Full    Dental   Pulmonary sleep apnea ,    Pulmonary exam normal        Cardiovascular Normal cardiovascular exam     Neuro/Psych    GI/Hepatic   Endo/Other    Renal/GU      Musculoskeletal   Abdominal   Peds  Hematology   Anesthesia Other Findings   Reproductive/Obstetrics                             Anesthesia Physical Anesthesia Plan  ASA: III  Anesthesia Plan: General   Post-op Pain Management:    Induction: Intravenous  PONV Risk Score and Plan: 2  Airway Management Planned: Oral ETT  Additional Equipment:   Intra-op Plan:   Post-operative Plan: Extubation in OR  Informed Consent: I have reviewed the patients History and Physical, chart, labs and discussed the procedure including the risks, benefits and alternatives for the proposed anesthesia with the patient or authorized representative who has indicated his/her understanding and acceptance.       Plan Discussed with: CRNA and Surgeon  Anesthesia Plan Comments:         Anesthesia Quick Evaluation

## 2020-04-21 NOTE — Progress Notes (Signed)
RT went into pt room to see if he was ready to be placed on bipap, pt stated he wasn't. RT told pt to call when ready.

## 2020-04-21 NOTE — Anesthesia Postprocedure Evaluation (Signed)
Anesthesia Post Note  Patient: BLYTHE HARTSHORN  Procedure(s) Performed: Thoracic ten to pelvis pedicle screw fixation with osteotomies (N/A Back) APPLICATION OF INTRAOPERATIVE CT SCAN (N/A Back)     Patient location during evaluation: PACU Anesthesia Type: General Level of consciousness: awake and alert Pain management: pain level controlled Vital Signs Assessment: post-procedure vital signs reviewed and stable Respiratory status: spontaneous breathing, nonlabored ventilation, respiratory function stable and patient connected to nasal cannula oxygen Cardiovascular status: blood pressure returned to baseline and stable Postop Assessment: no apparent nausea or vomiting Anesthetic complications: no   No complications documented.  Last Vitals:  Vitals:   04/21/20 1534 04/21/20 1537  BP:  130/78  Pulse: 88 93  Resp: 20 20  Temp:  36.9 C  SpO2: 97% 99%    Last Pain:  Vitals:   04/21/20 1530  TempSrc:   PainSc: Asleep                 Joquan Lotz DAVID

## 2020-04-22 NOTE — Progress Notes (Addendum)
Subjective: Patient reports "I feel rough, but better than yesterday"  Objective: Vital signs in last 24 hours: Temp:  [97.5 F (36.4 C)-98.7 F (37.1 C)] 98.3 F (36.8 C) (10/26 0800) Pulse Rate:  [50-105] 50 (10/26 0800) Resp:  [12-24] 17 (10/26 0800) BP: (113-136)/(73-89) 132/77 (10/26 0800) SpO2:  [92 %-100 %] 100 % (10/26 0800)  Intake/Output from previous day: 10/25 0701 - 10/26 0700 In: 5827.5 [P.O.:720; I.V.:4137.5; Blood:350; IV Piggyback:520] Out: 1435 [Urine:425; Drains:260; Blood:750] Intake/Output this shift: Total I/O In: -  Out: 100 [Urine:100]  Alert, conversant. Some back pain and left thigh pain as expected from surgery. Bilat shoulder soreness expected from positioning in 6hour surgery. Good strength all extremities with exception of left dorsiflexion (longstanding) and left hip flexor (intra-op psoas irritation). Incisions abdomen and thoraco-lumbar spine without erythema, swelling or drainage beneath honeycomb and dermabond. Hemovac ~216ml post-op.  Belly slightly distended with report of mild nausea this am. Will monitor.  Lab Results: No results for input(s): WBC, HGB, HCT, PLT in the last 72 hours. BMET No results for input(s): NA, K, CL, CO2, GLUCOSE, BUN, CREATININE, CALCIUM in the last 72 hours.  Studies/Results: DG SCOLIOSIS EVAL COMPLETE SPINE 2 OR 3 VIEWS  Result Date: 04/20/2020 CLINICAL DATA:  Scoliosis with left leg weakness EXAM: DG SCOLIOSIS EVAL COMPLETE SPINE 2-3V COMPARISON:  None. FINDINGS: There is a minimal leftward curvature of the lumbar spine centered at L2-L3 of 5 degrees. Interbody fixation devices are noted from L2 through to S1. Slight anterior wedge compression deformities are noted in the T11 and T12 vertebral bodies. IMPRESSION: Mild leftward curvature of the lumbar spine centered at L2-L3 of 5 degrees Electronically Signed   By: Jonna Clark M.D.   On: 04/20/2020 19:11    Assessment/Plan: improving  LOS: 4 days  Mobilize with  PT & OT in brace.   Georgiann Cocker 04/22/2020, 11:04 AM  Mobilize with PT.  Will likely need Rehab.

## 2020-04-22 NOTE — Progress Notes (Signed)
Rehab Admissions Coordinator Note:  Patient was screened by Clois Dupes for appropriateness for an Inpatient Acute Rehab Consult per therapy recommendation.  At this time, we are recommending Inpatient Rehab consult. Please place rehab consult order if you would like patient considered for admission. Please advise.  Clois Dupes RN MSN 04/22/2020, 4:20 PM  I can be reached at [phone].

## 2020-04-22 NOTE — Progress Notes (Addendum)
  Progress Note    04/22/2020 7:29 AM 1 Day Post-Op  Subjective:  No complaints this morning   Vitals:   04/22/20 0031 04/22/20 0417  BP: 131/85 127/77  Pulse: (!) 102 (!) 105  Resp: 18   Temp: 98.7 F (37.1 C) 98.3 F (36.8 C)  SpO2: 97% 95%   Physical Exam: Lungs: non labored Incisions:  Midline abd incision c/d/i Extremities:  Palpable and symmetrical DP pulses Abdomen:  Soft, NT, ND Neurologic: A&O  CBC    Component Value Date/Time   WBC 7.8 04/15/2020 0946   RBC 5.10 04/15/2020 0946   HGB 15.6 04/15/2020 0946   HCT 47.5 04/15/2020 0946   PLT 280 04/15/2020 0946   MCV 93.1 04/15/2020 0946   MCH 30.6 04/15/2020 0946   MCHC 32.8 04/15/2020 0946   RDW 12.8 04/15/2020 0946   LYMPHSABS 1.6 11/13/2007 1355   MONOABS 0.5 11/13/2007 1355   EOSABS 0.2 11/13/2007 1355   BASOSABS 0.0 11/13/2007 1355    BMET    Component Value Date/Time   NA 140 04/15/2020 0946   K 3.9 04/15/2020 0946   CL 104 04/15/2020 0946   CO2 25 04/15/2020 0946   GLUCOSE 112 (H) 04/15/2020 0946   BUN 17 04/15/2020 0946   CREATININE 0.83 04/15/2020 0946   CALCIUM 9.2 04/15/2020 0946   GFRNONAA >60 04/15/2020 0946   GFRAA >60 06/16/2016 0425    INR    Component Value Date/Time   INR 1.0 11/13/2007 1355     Intake/Output Summary (Last 24 hours) at 04/22/2020 0729 Last data filed at 04/22/2020 0636 Gross per 24 hour  Intake 5827.5 ml  Output 1435 ml  Net 4392.5 ml     Assessment/Plan:  69 y.o. male is s/p anterior exposure for L4-5 and L5-S1 disc space fusion 4 Days Post-Op   BLE well perfused with symmetrical DP pulses Midline abd incision unremarkable Vascular available as needed   Emilie Rutter, PA-C [facility] [phone] 04/22/2020 7:29 AM   I have seen and evaluated the patient. I agree with the PA note as documented above.  Postop day 4 status post anterior abdominal exposure for L4-L5 L5-S1 ALIF on Friday.  Also had XLIF at higher  lumbar levels.  Yesterday went for stage II with neurosurgery.  Incision looks good.  Appears to have appropriate postop incisional tenderness in the abdomen.  Palpable pedal pulses.  Vascular is available if concerns arise.  Has some left leg hip flexor weakness.  [name], MD [facility] [place] Office: [phone]

## 2020-04-22 NOTE — Progress Notes (Signed)
Physical Therapy Treatment Patient Details Name: Marco Kennedy MRN: [id number] DOB: 02-06-1951 Today's Date: 04/22/2020    History of Present Illness The pt is a 69 yo male presenting s/p anterior and lateral lumbar fusion L4-5 and L5-S1 10/22 (second stage planned for monday 10/25) due to ongoing chronic LBP with sx radiating into his LLE. PMH includes: THA 2017, sleep apnea.     PT Comments    Pt stiff and painful from laying in 1 position since post surgery yesterday.  Emphasis on rolling, transition to EOB with scooting, donning the brace, multiple sit to stands and ambulation in the RW.  Pt ended in the chair for building tolerance to sitting.    Follow Up Recommendations  CIR;Supervision/Assistance - 24 hour     Equipment Recommendations  3in1 (PT)    Recommendations for Other Services       Precautions / Restrictions Precautions Precautions: Back;Fall Precaution Booklet Issued: Yes (comment) Required Braces or Orthoses: Spinal Brace Spinal Brace: Thoracolumbosacral orthotic;Applied in sitting position    Mobility  Bed Mobility Overal bed mobility: Needs Assistance Bed Mobility: Rolling;Sidelying to Sit Rolling: Mod assist Sidelying to sit: Max assist;+2 for physical assistance       General bed mobility comments: cued for technique, truncal assist for roll right and significant truncal assist up from R elbow  Transfers Overall transfer level: Needs assistance   Transfers: Sit to/from Stand;Stand Pivot Transfers Sit to Stand: Mod assist;+2 physical assistance;Max assist Stand pivot transfers: Mod assist;+2 physical assistance       General transfer comment: cues for hand placement, assist forward and boost.  Steadying assist once up until pt sure of stable stance.  Ambulation/Gait Ambulation/Gait assistance: Min assist;+2 safety/equipment Gait Distance (Feet): 4 Feet Assistive device: Rolling walker (2 wheeled) Gait Pattern/deviations: Step-to  pattern;Step-through pattern;Decreased step length - right;Decreased step length - left;Decreased stance time - left;Decreased stride length Gait velocity: decreased Gait velocity interpretation: <1.31 ft/sec, indicative of household ambulator General Gait Details: low amplitude steps in general, difficulty advancing L LE vs R LE.  Stability assist with w/shift assist   Stairs             Wheelchair Mobility    Modified Rankin (Stroke Patients Only)       Balance Overall balance assessment: Needs assistance   Sitting balance-Leahy Scale: Fair Sitting balance - Comments: unable to lift LE's well to have others donn his socks, but can maintain balance without assist   Standing balance support: Bilateral upper extremity supported Standing balance-Leahy Scale: Poor Standing balance comment: reliant on BUE support and modA external support                            Cognition Arousal/Alertness: Awake/alert Behavior During Therapy: WFL for tasks assessed/performed Overall Cognitive Status: Within Functional Limits for tasks assessed                                        Exercises      General Comments General comments (skin integrity, edema, etc.): Reinforced back care/prec, log roll, lifting restrictions, bracing issues.      Pertinent Vitals/Pain Pain Assessment: Faces Faces Pain Scale: Hurts whole lot Pain Location: back Pain Descriptors / Indicators: Discomfort;Guarding;Grimacing Pain Intervention(s): Monitored during session;Premedicated before session    Home Living  Prior Function            PT Goals (current goals can now be found in the care plan section) Acute Rehab PT Goals Patient Stated Goal: reduce pain, hopeful for home PT Goal Formulation: With patient Time For Goal Achievement: 05/03/20 Potential to Achieve Goals: Good Progress towards PT goals: Progressing toward goals     Frequency    Min 5X/week      PT Plan Discharge plan needs to be updated    Co-evaluation PT/OT/SLP Co-Evaluation/Treatment: Yes Reason for Co-Treatment: For patient/therapist safety;To address functional/ADL transfers PT goals addressed during session: Mobility/safety with mobility OT goals addressed during session: ADL's and self-care;Strengthening/ROM      AM-PAC PT "6 Clicks" Mobility   Outcome Measure  Help needed turning from your back to your side while in a flat bed without using bedrails?: A Little Help needed moving from lying on your back to sitting on the side of a flat bed without using bedrails?: A Lot Help needed moving to and from a bed to a chair (including a wheelchair)?: A Lot Help needed standing up from a chair using your arms (e.g., wheelchair or bedside chair)?: A Lot Help needed to walk in hospital room?: A Lot Help needed climbing 3-5 steps with a railing? : A Lot 6 Click Score: 13    End of Session Equipment Utilized During Treatment: Back brace Activity Tolerance: Patient tolerated treatment well;Patient limited by fatigue;Patient limited by pain Patient left: in chair;with call bell/phone within reach;with chair alarm set Nurse Communication: Mobility status PT Visit Diagnosis: Muscle weakness (generalized) (M62.81);Other abnormalities of gait and mobility (R26.89);Pain Pain - part of body:  (left leg and back)     Time: 5176-1607 PT Time Calculation (min) (ACUTE ONLY): 48 min  Charges:  $Gait Training: 8-22 mins $Therapeutic Activity: 8-22 mins                     04/22/2020  Marco Kennedy., PT Acute Rehabilitation Services [phone]  (pager) [phone]  (office)   Marco Kennedy Marco Kennedy 04/22/2020, 4:15 PM

## 2020-04-22 NOTE — Progress Notes (Signed)
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 PMH includes: THA 2017, sleep apnea.    OT comments  Pt seen s/p second spinal surgery on 10/25. Pt continues to have limitations due to pain and LE weakness. Pt requiring two person assist for functional transfers and tolerating taking few steps in room using RW. He requires up to maxA (+2) for LB ADL and modA for brace management. Have updated d/c recommendations as feel he will benefit from CIR level therapies at time of discharge. Will continue to follow acutely.   Follow Up Recommendations  CIR    Equipment Recommendations  Tub/shower seat    Recommendations for Other Services Rehab consult    Precautions / Restrictions Precautions Precautions: Back;Fall Precaution Booklet Issued: Yes (comment) Required Braces or Orthoses: Spinal Brace Spinal Brace: Thoracolumbosacral orthotic;Applied in sitting position Restrictions Weight Bearing Restrictions: No       Mobility Bed Mobility Overal bed mobility: Needs Assistance Bed Mobility: Rolling;Sidelying to Sit Rolling: Mod assist Sidelying to sit: Max assist;+2 for physical assistance       General bed mobility comments: cued for technique, truncal assist for roll right and significant truncal assist up from R elbow  Transfers Overall transfer level: Needs assistance   Transfers: Sit to/from Stand;Stand Pivot Transfers Sit to Stand: Mod assist;+2 physical assistance;Max assist Stand pivot transfers: Mod assist;+2 physical assistance       General transfer comment: cues for hand placement, assist forward and boost.  Steadying assist once up until pt sure of stable stance.     Balance Overall balance assessment: Needs assistance   Sitting balance-Leahy Scale: Fair Sitting balance - Comments: unable to lift LE's well to have others donn his socks, but can maintain balance without assist   Standing balance support: Bilateral upper extremity supported Standing balance-Leahy Scale: Poor Standing balance comment: reliant on BUE support and modA external support                           ADL either performed or assessed with clinical judgement   ADL Overall ADL's : Needs assistance/impaired             Lower Body Bathing: Maximal assistance;+2 for physical assistance;Sit to/from stand   Upper Body Dressing : Moderate assistance;Sitting Upper Body Dressing Details (indicate cue type and reason): for TLSO management Lower Body Dressing: Total assistance;+2 for physical assistance;Sit to/from stand Lower Body Dressing Details (indicate cue type and reason): socks             Functional mobility during ADLs: Moderate assistance;+2 for physical assistance;+2 for safety/equipment;Rolling walker                         Cognition Arousal/Alertness: Awake/alert Behavior During Therapy: WFL for tasks assessed/performed Overall Cognitive Status: Within Functional Limits for tasks assessed                                          Exercises     Shoulder Instructions       General Comments  Reinforced back care/prec, log roll, lifting restrictions, bracing issues.    Pertinent Vitals/ Pain       Pain Assessment: Faces Faces Pain Scale: Hurts whole lot Pain Location: back Pain Descriptors / Indicators: Discomfort;Guarding;Grimacing Pain Intervention(s): Monitored during session;Repositioned;Premedicated before session  Home Living                                          Prior Functioning/Environment              Frequency  Min 2X/week        Progress Toward Goals  OT Goals(current  goals can now be found in the care plan section)  Progress towards OT goals: Progressing toward goals  Acute Rehab OT Goals Patient Stated Goal: reduce pain, hopeful for home OT Goal Formulation: With patient Time For Goal Achievement: 05/03/20 Potential to Achieve Goals: Good  Plan Discharge plan remains appropriate    Co-evaluation    PT/OT/SLP Co-Evaluation/Treatment: Yes Reason for Co-Treatment: For patient/therapist safety;To address functional/ADL transfers PT goals addressed during session: Mobility/safety with mobility OT goals addressed during session: ADL's and self-care      AM-PAC OT "6 Clicks" Daily Activity     Outcome Measure   Help from another person eating meals?: None Help from another person taking care of personal grooming?: A Little Help from another person toileting, which includes using toliet, bedpan, or urinal?: A Lot Help from another person bathing (including washing, rinsing, drying)?: A Lot Help from another person to put on and taking off regular upper body clothing?: A Lot Help from another person to put on and taking off regular lower body clothing?: A Lot 6 Click Score: 15    End of Session Equipment Utilized During Treatment: Gait belt;Rolling walker;Back brace  OT Visit Diagnosis: Other abnormalities of gait and mobility (R26.89);Pain;Unsteadiness on feet (R26.81) Pain - part of body:  (back)   Activity Tolerance Patient tolerated treatment well   Patient Left in chair;with call bell/phone within reach   Nurse Communication Mobility status        Time: 7741-2878 OT Time Calculation (min): 48 min  Charges: OT General Charges $OT Visit: 1 Visit OT Treatments $Self Care/Home Management : 8-22 mins  Marcy Siren, OT Acute Rehabilitation Services Pager [phone] Office [phone]    Marco Kennedy 04/22/2020, 5:41 PM

## 2020-04-23 MED FILL — Sodium Chloride IV Soln 0.9%: INTRAVENOUS | Qty: 1000 | Status: AC

## 2020-04-23 MED FILL — Heparin Sodium (Porcine) Inj 1000 Unit/ML: INTRAMUSCULAR | Qty: 30 | Status: AC

## 2020-04-23 MED FILL — Sodium Chloride Irrigation Soln 0.9%: Qty: 3000 | Status: AC

## 2020-04-23 NOTE — Progress Notes (Signed)
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 PMH includes: THA 2017, sleep apnea.     PT Comments    Pt remains stiff, sore and bloated, having not had a stool yet.  Emphasis on education, on technique for rolling and transitioning to EOB, sit to stand and progressing gait.     Follow Up Recommendations  CIR;Supervision/Assistance - 24 hour     Equipment Recommendations  3in1 (PT)    Recommendations for Other Services       Precautions / Restrictions Precautions Precautions: Back;Fall Precaution Booklet Issued: Yes (comment) Required Braces or Orthoses: Spinal Brace Spinal Brace: Thoracolumbosacral orthotic;Applied in sitting position    Mobility  Bed Mobility Overal bed mobility: Needs Assistance Bed Mobility: Rolling;Sidelying to Sit Rolling: Mod assist Sidelying to sit: Mod assist;Max assist;+2 for safety/equipment       General bed mobility comments: cued for technique, truncal assist for roll right and significant truncal assist up from R elbow  Transfers Overall transfer level: Needs assistance   Transfers: Sit to/from Stand;Stand Pivot Transfers Sit to Stand: Mod assist;+2 physical assistance;Max assist Stand pivot transfers: Mod assist;+2 physical assistance       General transfer comment: cues for hand placement, assist forward and boost.   Ambulation/Gait Ambulation/Gait assistance: Min assist;+2 safety/equipment Gait Distance (Feet): 18 Feet Assistive device: Rolling walker (2 wheeled) Gait Pattern/deviations: Step-to pattern;Step-through pattern;Decreased step length - right;Decreased step length - left;Decreased stance time - left;Decreased stride  length Gait velocity: decreased Gait velocity interpretation: <1.31 ft/sec, indicative of household ambulator General Gait Details: cues for posture correction, short low amplitude steps with stability assist   Stairs             Wheelchair Mobility    Modified Rankin (Stroke Patients Only)       Balance Overall balance assessment: Needs assistance   Sitting balance-Leahy Scale: Fair (to poor) Sitting balance - Comments: Cannot hold posture long without UE assist. pt unable to donn brace due to unable to balance long enough   Standing balance support: Bilateral upper extremity supported Standing balance-Leahy Scale: Poor Standing balance comment: reliant on BUE support and modA external support                            Cognition Arousal/Alertness: Awake/alert Behavior During Therapy: WFL for tasks assessed/performed Overall Cognitive Status: Within Functional Limits for tasks assessed                                        Exercises      General Comments General comments (skin integrity, edema, etc.): reinforced back care/prec, log roll, transitions, bracing issues, lifting restrictions to pt/wife      Pertinent Vitals/Pain Pain Assessment: Faces Faces Pain Scale: Hurts even more Pain Location: back Pain Descriptors / Indicators: Discomfort;Guarding;Grimacing Pain Intervention(s): Monitored during session;Limited activity within patient's tolerance    Home Living                      Prior Function  PT Goals (current goals can now be found in the care plan section) Acute Rehab PT Goals Patient Stated Goal: reduce pain, hopeful for home PT Goal Formulation: With patient Time For Goal Achievement: 05/03/20 Potential to Achieve Goals: Good    Frequency    Min 5X/week      PT Plan Discharge plan needs to be updated    Co-evaluation              AM-PAC PT "6 Clicks" Mobility   Outcome  Measure  Help needed turning from your back to your side while in a flat bed without using bedrails?: A Little Help needed moving from lying on your back to sitting on the side of a flat bed without using bedrails?: A Lot Help needed moving to and from a bed to a chair (including a wheelchair)?: A Lot Help needed standing up from a chair using your arms (e.g., wheelchair or bedside chair)?: A Lot Help needed to walk in hospital room?: A Lot Help needed climbing 3-5 steps with a railing? : A Lot 6 Click Score: 13    End of Session Equipment Utilized During Treatment: Back brace Activity Tolerance: Patient tolerated treatment well;Patient limited by fatigue;Patient limited by pain Patient left: in chair;with call bell/phone within reach;with chair alarm set Nurse Communication: Mobility status PT Visit Diagnosis: Muscle weakness (generalized) (M62.81);Other abnormalities of gait and mobility (R26.89);Pain Pain - part of body:  (left leg and back)     Time: 8338-2505 PT Time Calculation (min) (ACUTE ONLY): 40 min  Charges:  $Gait Training: 8-22 mins $Therapeutic Activity: 8-22 mins $Self Care/Home Management: 8-22                     04/23/2020  Jacinto Halim., PT Acute Rehabilitation Services [phone]  (pager) [phone]  (office)   Eliseo Gum Calan Doren 04/23/2020, 2:38 PM

## 2020-04-23 NOTE — Progress Notes (Signed)
Inpatient Rehabilitation Admissions Coordinator  Inpatient rehab consult received. I met with patient and his wife at bedside. We discussed goals and expectations of a possible CIR admit. They would like to pursue admit. I will contact [employer] to begin authorization for a possible admit.  Danne Baxter, RN, MSN Rehab Admissions Coordinator [phone] 04/23/2020 1:13 PM

## 2020-04-23 NOTE — Progress Notes (Signed)
Subjective: Patient reports that he is continuing to improve and feel better. He does continue to report that he feels distended and has not had a bowel movement despite laxatives and suppositories.   Objective: Vital signs in last 24 hours: Temp:  [97.4 F (36.3 C)-99.1 F (37.3 C)] 98.7 F (37.1 C) (10/27 0739) Pulse Rate:  [47-113] 87 (10/27 0739) Resp:  [16-18] 16 (10/27 0739) BP: (98-151)/(56-79) 151/79 (10/27 0739) SpO2:  [95 %-100 %] 98 % (10/27 0739)  Intake/Output from previous day: 10/26 0701 - 10/27 0700 In: 870 [P.O.:240; I.V.:630] Out: 265 [Urine:100; Drains:165] Intake/Output this shift: Total I/O In: -  Out: 400 [Urine:400]  Physical Exam: Patient is alert, oriented X 4, and conversant.  He MAEW with good strength with the exception of some chronic dorsiflexion weakness.  Abdominal and thoracolumbar incisions are clean dry intact.  Hemovac with about 50 mL of output overnight.  Abdomen is slightly distended with tympany throughout with percussion.  Bowel sounds hypoactive in all 4 quadrants.  Lab Results: No results for input(s): WBC, HGB, HCT, PLT in the last 72 hours. BMET No results for input(s): NA, K, CL, CO2, GLUCOSE, BUN, CREATININE, CALCIUM in the last 72 hours.  Studies/Results: No results found.  Assessment/Plan: The patient is continuing to improve.  He is experiencing the expected postoperative psoas irritation and incisional discomfort.  He is concerned that he has not had a bowel movement as of today and feels that his abdomen is slightly distended.  Continue stool softeners, laxative, and suppositories.  P.r.n. Fleet enema if needed.  Will remove drain today.  Continue to mobilize and work with physical therapy occupational therapy.  Continue brace.  Will start Lovenox today.  LOS: 5 days     Council Mechanic, DNP, NP-C 04/23/2020, 7:47 AM

## 2020-04-24 ENCOUNTER — Encounter (HOSPITAL_COMMUNITY): Payer: Self-pay | Admitting: Neurosurgery

## 2020-04-24 MED ORDER — ENOXAPARIN SODIUM 40 MG/0.4ML ~~LOC~~ SOLN: 40.0000 mg | Freq: Two times a day (BID) | SUBCUTANEOUS | Status: DC

## 2020-04-24 MED ORDER — PANTOPRAZOLE SODIUM 40 MG PO TBEC
40.0000 mg | DELAYED_RELEASE_TABLET | Freq: Every day | ORAL | Status: DC
  Administered 2020-04-24 – 2020-05-07 (×14): 40 mg via ORAL
  Filled 2020-04-24 (×14): qty 1

## 2020-04-24 MED ORDER — ENOXAPARIN SODIUM 40 MG/0.4ML ~~LOC~~ SOLN
40.0000 mg | Freq: Two times a day (BID) | SUBCUTANEOUS | Status: DC
  Filled 2020-04-24: qty 0.4

## 2020-04-24 MED ORDER — ENOXAPARIN SODIUM 40 MG/0.4ML ~~LOC~~ SOLN
40.0000 mg | Freq: Once | SUBCUTANEOUS | Status: AC
  Administered 2020-04-24: 40 mg via SUBCUTANEOUS

## 2020-04-24 MED ORDER — FLEET ENEMA 7-19 GM/118ML RE ENEM: 1.0000 | ENEMA | Freq: Once | RECTAL | Status: DC | PRN

## 2020-04-24 NOTE — Progress Notes (Signed)
PHARMACIST - PHYSICIAN COMMUNICATION DR:  Venetia Maxon CONCERNING: Protonix IV to Oral Route Change Policy  RECOMMENDATION: This patient is receiving Protonix by the intravenous route.  Based on criteria approved by the Pharmacy and Therapeutics Committee, this drug is being converted to the equivalent oral dose form(s).  DESCRIPTION: These criteria include:  The patient is eating (either orally or via tube) and/or has been taking other orally administered medications for a least 24 hours  There is no active GI bleed or impaired GI absorption noted.   If you have questions about this conversion, please contact the Pharmacy Department  []   [phone] )  ( [phone] [x]   [phone] )   []   [phone] )  [facility] []   [phone] )  [facility]    Thank you for allowing pharmacy to be a part of this patient's care.  [facility], PharmD, BCPS Clinical Pharmacist Clinical phone for 04/24/2020: [phone] 04/24/2020 2:32 PM   **Pharmacist phone directory can now be found on [email or web address] ([email or web address]).  Listed under [facility].

## 2020-04-24 NOTE — Progress Notes (Signed)
Pt refused bipap for tonight 

## 2020-04-24 NOTE — Progress Notes (Signed)
[facility]  Subjective  - had a small BM last night after enema.   Objective (!) 143/75 67 98.6 F (37 C) (Axillary) 18 98%  Intake/Output Summary (Last 24 hours) at 04/24/2020 0739 Last data filed at 04/24/2020 0300 Gross per 24 hour  Intake 480 ml  Output 1500 ml  Net -1020 ml    Abdomen softer, no rebound or guarding. Left DP palpable Abdominal incision c/d/i  Laboratory Lab Results: No results for input(s): WBC, HGB, HCT, PLT in the last 72 hours. BMET No results for input(s): NA, K, CL, CO2, GLUCOSE, BUN, CREATININE, CALCIUM in the last 72 hours.  COAG Lab Results  Component Value Date   INR 1.0 11/13/2007   No results found for: PTT  Assessment/Planning:  Postop day #6 status post L4-L5 and L5-S1 ALIF with XLIF and additional posterior instrumentation.  Abdomen is softer today.  He did have a small bowel movement last night.  Palpable pedal pulses.  Appears he is being evaluated for CIR.  Vascular is available if questions or concerns arise.   [name] 04/24/2020 7:39 AM --

## 2020-04-24 NOTE — Social Work (Signed)
CSW left [email or web address] SNF choice listing in patient's room per spouse request.   Antony Blackbird, MSW, [facility] Clinical Social Worker

## 2020-04-24 NOTE — Progress Notes (Signed)
Inpatient Rehabilitation Admissions Coordinator  I have received and insurance denial form [employer] of [place]. I met with patient and his wife at bedside and they are aware. They do not want SNF and feel another 1 - 2 days he can go home. I have alerted attending service, acute team and TOC. We will sign off at this time.  Danne Baxter, RN, MSN Rehab Admissions Coordinator [phone] 04/24/2020 12:05 PM

## 2020-04-24 NOTE — Progress Notes (Signed)
Subjective: Patient reports that he is feeling much better today and has had a couple of bowel movements.   Objective: Vital signs in last 24 hours: Temp:  [97.5 F (36.4 C)-99.1 F (37.3 C)] 99.1 F (37.3 C) (10/28 0758) Pulse Rate:  [67-93] 88 (10/28 0758) Resp:  [16-20] 18 (10/28 0758) BP: (117-146)/(68-82) 146/81 (10/28 0758) SpO2:  [98 %-99 %] 98 % (10/28 0758)  Intake/Output from previous day: 10/27 0701 - 10/28 0700 In: 480 [P.O.:480] Out: 1500 [Urine:1450; Drains:50] Intake/Output this shift: Total I/O In: -  Out: 300 [Urine:300]  Physical Exam: Patient is awake, A/O X 4, and conversant. He MAEW with good strength with the exception of some chronic dorsiflexion weakness.  Abdominal and thoracolumbar incisions are CDI.  Hemovac removed yesterday.  Abdomen is less distended .  Lab Results: No results for input(s): WBC, HGB, HCT, PLT in the last 72 hours. BMET No results for input(s): NA, K, CL, CO2, GLUCOSE, BUN, CREATININE, CALCIUM in the last 72 hours.  Studies/Results: No results found.  Assessment/Plan: The patient is doing much better today and is in better spirits.  He is continuing to  experiencing the expected postoperative psoas irritation and incisional discomfort. He does report a resolution of his bilateral shoulder pain. Had multiple bowel movements since yesterday. Continue bowel regimen.  Continue to mobilize and work with physical therapy occupational therapy.  Continue brace.  Plan for CIR.    LOS: 6 days    Council Mechanic, DNP, NP-C 04/24/2020, 8:40 AM

## 2020-04-24 NOTE — Progress Notes (Signed)
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 PMH includes: THA 2017, sleep apnea.    OT comments  Pt seen for OT follow up with focus on ADL mobility progression and training with spinal precautions. Pt completed sit <> stand with min-mod A +2 with RW to power up and steady. He was then able to complete mobility into bathroom for toilet transfer with min A +2 for equipment management. Reviewed safe peri hygiene with spinal precautions. Pt then continued to complete functional mobility a household distance in the hallway with min A and RW with close chair follow. Then reviewed practicing heel slides while seated up in recliner to promote independent figure 4 position for LB dressing. Continue to recommend pt for CIR. Will continue to follow.    Follow Up Recommendations  CIR    Equipment Recommendations  Tub/shower seat    Recommendations for Other Services Rehab consult    Precautions / Restrictions Precautions Precautions: Back;Fall Precaution Comments: reviewed precautions in context of ADL Required Braces or Orthoses: Spinal Brace Spinal Brace: Thoracolumbosacral orthotic;Applied in sitting position Restrictions Weight Bearing Restrictions: No       Mobility Bed Mobility               General bed mobility comments: up in chair and returned to chair  Transfers Overall transfer level: Needs assistance Equipment used: Rolling walker (2 wheeled) Transfers: Sit to/from Stand Sit to Stand: Mod assist;+2 physical assistance;Min assist         General transfer comment: cues for hand placement, assist forward and boost.     Balance Overall balance assessment: Needs  assistance Sitting-balance support: Single extremity supported Sitting balance-Leahy Scale: Poor Sitting balance - Comments: Cannot hold posture long without UE assist. pt unable to donn brace due to unable to balance long enough   Standing balance support: Bilateral upper extremity supported;During functional activity Standing balance-Leahy Scale: Poor Standing balance comment: reliant on BUE support and modA external support                           ADL either performed or assessed with clinical judgement   ADL Overall ADL's : Needs assistance/impaired                     Lower Body Dressing: Maximal assistance;+2 for safety/equipment Lower Body Dressing Details (indicate cue type and reason): educated on figure 4 position and beginning to perform heel slides in preparation to improve this motion Toilet Transfer: Minimal assistance;+2 for physical assistance;+2 for safety/equipment;RW Toilet Transfer Details (indicate cue type and reason): pt able to compelte mobility into toilet this date. BSC placed over top toilet. Assist mostly needed to power up and steady         Functional mobility during ADLs: Minimal assistance;Rolling walker;+2 for safety/equipment General ADL Comments: pt able to progress to mobility a household distance with RW and close chair follow     Vision Baseline Vision/History: Wears glasses Wears Glasses: At all times Patient Visual Report: No change from baseline Vision Assessment?: No apparent visual deficits   Perception     Praxis      Cognition Arousal/Alertness: Awake/alert Behavior  During Therapy: WFL for tasks assessed/performed Overall Cognitive Status: Within Functional Limits for tasks assessed                                 General Comments: slightly impaired in memory, mostly suspect due to pain meds        Exercises Other Exercises Other Exercises: BLEs: introduced performing heel slides  bilaterally multiple times throughout day to promote figure 4 position for LB dressing   Shoulder Instructions       General Comments vss.  Worked on bracing, discussed BLT and how it related to lifting more than using UE's to boost, scoot, stand.    Pertinent Vitals/ Pain       Pain Assessment: Faces Faces Pain Scale: Hurts little more Pain Location: back Pain Descriptors / Indicators: Discomfort;Guarding;Grimacing Pain Intervention(s): Monitored during session;Repositioned  Home Living                                          Prior Functioning/Environment              Frequency           Progress Toward Goals  OT Goals(current goals can now be found in the care plan section)  Progress towards OT goals: Progressing toward goals  Acute Rehab OT Goals Patient Stated Goal: reduce pain, hopeful for home OT Goal Formulation: With patient Time For Goal Achievement: 05/03/20 Potential to Achieve Goals: Good  Plan Discharge plan remains appropriate    Co-evaluation    PT/OT/SLP Co-Evaluation/Treatment: Yes Reason for Co-Treatment: For patient/therapist safety;To address functional/ADL transfers PT goals addressed during session: Mobility/safety with mobility;Strengthening/ROM OT goals addressed during session: ADL's and self-care;Proper use of Adaptive equipment and DME;Strengthening/ROM      AM-PAC OT "6 Clicks" Daily Activity     Outcome Measure   Help from another person eating meals?: None Help from another person taking care of personal grooming?: A Little Help from another person toileting, which includes using toliet, bedpan, or urinal?: A Lot Help from another person bathing (including washing, rinsing, drying)?: A Lot Help from another person to put on and taking off regular upper body clothing?: A Lot Help from another person to put on and taking off regular lower body clothing?: A Lot 6 Click Score: 15    End of Session Equipment  Utilized During Treatment: Gait belt;Rolling walker;Back brace  OT Visit Diagnosis: Other abnormalities of gait and mobility (R26.89);Pain;Unsteadiness on feet (R26.81) Pain - part of body:  (back)   Activity Tolerance Patient tolerated treatment well   Patient Left in chair;with call bell/phone within reach   Nurse Communication Mobility status;Precautions        Time: 1449-1550 OT Time Calculation (min): 61 min  Charges: OT General Charges $OT Visit: 1 Visit OT Treatments $Self Care/Home Management : 23-37 mins  Dalphine Handing, MSOT, OTR/L Acute Rehabilitation Services [facility] Office Number: [phone] Pager: [phone]  Dalphine Handing 04/24/2020, 6:07 PM

## 2020-04-24 NOTE — Progress Notes (Addendum)
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 PMH includes: THA 2017, sleep apnea.     PT Comments    Pt still uncomfortable and bloated from having not had proper bowel movements.  I suspect this is hindering his ability to come forward, but there is also a level of general weakness overall and bil LE's/knee dysfunction specifically which is making his mobility more difficult.  Emphasis on sit to stands, gait and education.    Follow Up Recommendations  CIR;Supervision/Assistance - 24 hour;Other (comment) (would best be managed at the intensity of CIR)     Equipment Recommendations  3in1 (PT)    Recommendations for Other Services       Precautions / Restrictions Precautions Precautions: Back;Fall Required Braces or Orthoses: Spinal Brace Spinal Brace: Thoracolumbosacral orthotic;Applied in sitting position    Mobility  Bed Mobility               General bed mobility comments: in chair on arrival  Transfers Overall transfer level: Needs assistance Equipment used: Rolling walker (2 wheeled) Transfers: Sit to/from Stand Sit to Stand: Mod assist;+2 physical assistance         General transfer comment: cues for hand placement, assist forward and boost.   Ambulation/Gait Ambulation/Gait assistance: Min assist;+2 safety/equipment Gait Distance (Feet): 15 Feet (to toilet, then 85 feet into the hall with chair follow) Assistive device: Rolling walker (2 wheeled) Gait Pattern/deviations: Step-to pattern;Step-through pattern;Decreased stride length Gait velocity: decreased Gait velocity interpretation: <1.8 ft/sec, indicate of risk for recurrent falls General Gait Details:  effortful gait with heavy use of UE's, slowly sagging posture with frequent cues to return to a submaximal stance.  Pt not well controlling use of the RW.  Noticeable fatigue.   Stairs             Wheelchair Mobility    Modified Rankin (Stroke Patients Only)       Balance     Sitting balance-Leahy Scale: Fair Sitting balance - Comments: Cannot hold posture long without UE assist. pt unable to donn brace due to unable to balance long enough     Standing balance-Leahy Scale: Poor Standing balance comment: reliant on BUE support and modA external support                            Cognition Arousal/Alertness: Awake/alert Behavior During Therapy: WFL for tasks assessed/performed Overall Cognitive Status: Within Functional Limits for tasks assessed                                        Exercises      General Comments General comments (skin integrity, edema, etc.): vss.  Worked on bracing, discussed BLT and how it related to lifting more than using UE's to boost, scoot, stand.      Pertinent Vitals/Pain Pain Assessment: Faces Faces Pain Scale: Hurts little more Pain Location: back Pain Descriptors / Indicators: Discomfort;Guarding;Grimacing Pain Intervention(s): Monitored during session    Home Living  Prior Function            PT Goals (current goals can now be found in the care plan section) Acute Rehab PT Goals Patient Stated Goal: reduce pain, hopeful for home PT Goal Formulation: With patient Time For Goal Achievement: 05/03/20 Potential to Achieve Goals: Good Progress towards PT goals: Progressing toward goals    Frequency    Min 5X/week      PT Plan Discharge plan needs to be updated    Co-evaluation PT/OT/SLP Co-Evaluation/Treatment: Yes Reason for Co-Treatment: For patient/therapist safety;To address functional/ADL transfers PT goals addressed during session: Mobility/safety with  mobility;Strengthening/ROM        AM-PAC PT "6 Clicks" Mobility   Outcome Measure  Help needed turning from your back to your side while in a flat bed without using bedrails?: A Little Help needed moving from lying on your back to sitting on the side of a flat bed without using bedrails?: A Lot Help needed moving to and from a bed to a chair (including a wheelchair)?: A Lot Help needed standing up from a chair using your arms (e.g., wheelchair or bedside chair)?: A Lot Help needed to walk in hospital room?: A Lot Help needed climbing 3-5 steps with a railing? : A Lot 6 Click Score: 13    End of Session Equipment Utilized During Treatment: Back brace Activity Tolerance: Patient tolerated treatment well;Patient limited by fatigue;Patient limited by pain Patient left: in chair;with call bell/phone within reach;with chair alarm set Nurse Communication: Mobility status PT Visit Diagnosis: Muscle weakness (generalized) (M62.81);Other abnormalities of gait and mobility (R26.89);Pain Pain - Right/Left: Left (bil) Pain - part of body: Leg (bil hip flexors/groin)     Time: 5852-7782 PT Time Calculation (min) (ACUTE ONLY): 59 min  Charges:  $Gait Training: 8-22 mins $Therapeutic Activity: 8-22 mins                     04/24/2020  Jacinto Halim., PT Acute Rehabilitation Services [phone]  (pager) [phone]  (office)   Eliseo Gum Jansen Sciuto 04/24/2020, 5:00 PM

## 2020-04-25 MED ORDER — SORBITOL 70 % SOLN
960.0000 mL | TOPICAL_OIL | Freq: Once | ORAL | Status: AC
  Administered 2020-04-25: 960 mL via RECTAL
  Filled 2020-04-25: qty 473

## 2020-04-25 MED ORDER — ENOXAPARIN SODIUM 40 MG/0.4ML ~~LOC~~ SOLN
40.0000 mg | SUBCUTANEOUS | Status: DC
  Administered 2020-04-25 – 2020-05-07 (×13): 40 mg via SUBCUTANEOUS
  Filled 2020-04-25 (×13): qty 0.4

## 2020-04-25 NOTE — Progress Notes (Signed)
Subjective: Patient reports that he did not have any bowel movements yesterday afternoon and is feeling "constipated and bloated."   Objective: Vital signs in last 24 hours: Temp:  [98.1 F (36.7 C)-98.9 F (37.2 C)] 98.8 F (37.1 C) (10/29 0842) Pulse Rate:  [78-101] 78 (10/29 0842) Resp:  [18] 18 (10/29 0842) BP: (131-148)/(65-84) 141/76 (10/29 0842) SpO2:  [96 %-100 %] 99 % (10/29 0842)  Intake/Output from previous day: 10/28 0701 - 10/29 0700 In: 600 [P.O.:600] Out: 1400 [Urine:1400] Intake/Output this shift: No intake/output data recorded.  Physical Exam: Patient is awake, A/O X 4, and conversant. He MAEW with good strength with the exception of some chronic dorsiflexion weakness. Abdominal and thoracolumbar incisions are CDI. Abdomen is distended. No abdominal pain to palpation. Bowel sounds hypoactive throughout, Right greater than left.   Lab Results: No results for input(s): WBC, HGB, HCT, PLT in the last 72 hours. BMET No results for input(s): NA, K, CL, CO2, GLUCOSE, BUN, CREATININE, CALCIUM in the last 72 hours.  Studies/Results: No results found.  Assessment/Plan: The patient is continuing to improve from his surgery. He is experiencing constipation despite prior treatments. We will do an additional enema and continue laxatives and stool softner. He continues to have difficulty with ambulation and would not be safe to be discharged home. He needs 24 hour supervision and assistance that would be best managed by CIR. Continue brace and work on ambulation and mobility. Continue working with PT and OT.     LOS: 7 days    Council Mechanic 04/25/2020, 9:20 AM

## 2020-04-25 NOTE — Progress Notes (Signed)
Pt stated that he does not want bipap now. Pt wants bipap at 2300 tonight.

## 2020-04-25 NOTE — Progress Notes (Signed)
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 PMH includes: THA 2017, sleep apnea.     PT Comments    Pt progressing slowly toward goals, but better gains today.  Worked on gaining momentum for the roll and using UE's efficiently for the transition to sitting.  Pt assisted more with donning brace.  Worked on sit to stand technique, but still limited by poor knees and progressing gait distance, but stress on quality and posture. Pt's dtr participated.    Follow Up Recommendations  CIR;Supervision/Assistance - 24 hour;Other (comment) (would best be managed at the intensity of CIR)     Equipment Recommendations  3in1 (PT)    Recommendations for Other Services       Precautions / Restrictions Precautions Precautions: Back;Fall Required Braces or Orthoses: Spinal Brace Spinal Brace: Thoracolumbosacral orthotic;Applied in sitting position    Mobility  Bed Mobility Overal bed mobility: Needs Assistance Bed Mobility: Rolling;Sidelying to Sit Rolling: Mod assist;Min assist Sidelying to sit: Mod assist       General bed mobility comments: reinforced stategies to help build momentum to roll and methods to transition  Transfers Overall transfer level: Needs assistance Equipment used: Rolling walker (2 wheeled) Transfers: Sit to/from Stand Sit to Stand: Mod assist         General transfer comment: cues for hand placement, assist forward and boost.   Ambulation/Gait Ambulation/Gait assistance: Min assist;+2 safety/equipment Gait Distance (Feet): 120 Feet Assistive device: Rolling walker (2 wheeled) Gait Pattern/deviations: Step-through pattern;Decreased stride length Gait  velocity: decreased   General Gait Details: effortful gait with heavy use of UE's, slowly sagging posture with frequent cues to return to a submaximal stance.  Pt not well controlling use of the RW.  Frequent standing rests to recover   Stairs             Wheelchair Mobility    Modified Rankin (Stroke Patients Only)       Balance     Sitting balance-Leahy Scale: Fair Sitting balance - Comments: able for the first time to really sit without UE assist and assist with donning the brace, for a short period.     Standing balance-Leahy Scale: Poor Standing balance comment: reliant on BUE support and modA external support                            Cognition Arousal/Alertness: Awake/alert Behavior During Therapy: WFL for tasks assessed/performed Overall Cognitive Status: Within Functional Limits for tasks assessed                                        Exercises Other Exercises Other Exercises: heals slides bil x7 reps    General Comments        Pertinent Vitals/Pain Pain Assessment: Faces Faces Pain Scale: Hurts little more Pain Location: back Pain Descriptors / Indicators: Discomfort;Guarding;Grimacing Pain Intervention(s): Monitored during session    Home Living                      Prior Function  PT Goals (current goals can now be found in the care plan section) Acute Rehab PT Goals Patient Stated Goal: reduce pain, hopeful for home PT Goal Formulation: With patient Time For Goal Achievement: 05/03/20 Potential to Achieve Goals: Good    Frequency    Min 5X/week      PT Plan      Co-evaluation              AM-PAC PT "6 Clicks" Mobility   Outcome Measure  Help needed turning from your back to your side while in a flat bed without using bedrails?: A Little Help needed moving from lying on your back to sitting on the side of a flat bed without using bedrails?: A Lot Help needed moving to  and from a bed to a chair (including a wheelchair)?: A Lot Help needed standing up from a chair using your arms (e.g., wheelchair or bedside chair)?: A Lot Help needed to walk in hospital room?: A Lot Help needed climbing 3-5 steps with a railing? : A Lot 6 Click Score: 13    End of Session Equipment Utilized During Treatment: Back brace Activity Tolerance: Patient tolerated treatment well;Patient limited by fatigue;Patient limited by pain Patient left: in chair;with call bell/phone within reach;with chair alarm set Nurse Communication: Mobility status PT Visit Diagnosis: Muscle weakness (generalized) (M62.81);Other abnormalities of gait and mobility (R26.89);Pain Pain - Right/Left: Left (bil) Pain - part of body: Leg (bil hip flexors/groin)     Time: 2025-4270 PT Time Calculation (min) (ACUTE ONLY): 29 min  Charges:  $Gait Training: 8-22 mins $Therapeutic Activity: 8-22 mins                     04/25/2020  Jacinto Halim., PT Acute Rehabilitation Services [phone]  (pager) [phone]  (office)   Marco Kennedy 04/25/2020, 4:38 PM

## 2020-04-25 NOTE — Progress Notes (Signed)
Inpatient Rehabilitation Admissions Coordinator  I have received a denial for CIR admit after appeal. I met with patient with his daughter, Liane Comber at bedside and they are aware. Attending service, acute team and TOC made aware. We will sign off at this time.  Danne Baxter, RN, MSN Rehab Admissions Coordinator [phone] 04/25/2020 12:44 PM

## 2020-04-26 MED ORDER — GABAPENTIN 300 MG PO CAPS
300.0000 mg | ORAL_CAPSULE | Freq: Two times a day (BID) | ORAL | Status: DC
  Administered 2020-04-26 – 2020-04-27 (×4): 300 mg via ORAL
  Filled 2020-04-26 (×4): qty 1

## 2020-04-26 NOTE — Progress Notes (Signed)
NEUROSURGERY PROGRESS NOTE  Doing ok. having some pain in his legs, left worse than right. No change in left leg weakness  Temp:  [97.6 F (36.4 C)-99.5 F (37.5 C)] 98.6 F (37 C) (10/30 0406) Pulse Rate:  [77-90] 77 (10/30 0406) Resp:  [14-20] 20 (10/30 0406) BP: (127-166)/(62-81) 130/63 (10/30 0406) SpO2:  [97 %-100 %] 97 % (10/30 0406)  Plan: Awaiting rehab placement. Continue therapies today. Did add gabapentin   Sherryl Manges, NP 04/26/2020 7:42 AM

## 2020-04-27 MED ORDER — PNEUMOCOCCAL VAC POLYVALENT 25 MCG/0.5ML IJ INJ
0.5000 mL | INJECTION | INTRAMUSCULAR | Status: DC
  Filled 2020-04-27: qty 0.5

## 2020-04-27 MED ORDER — INFLUENZA VAC A&B SA ADJ QUAD 0.5 ML IM PRSY
0.5000 mL | PREFILLED_SYRINGE | INTRAMUSCULAR | Status: AC
  Administered 2020-05-05: 0.5 mL via INTRAMUSCULAR
  Filled 2020-04-27 (×2): qty 0.5

## 2020-04-27 NOTE — Progress Notes (Signed)
NEUROSURGERY PROGRESS NOTE  Doing well. Having a moderate about of back pain because he just received a bath. Was able to sit in the chair yesterday for two hours. Spoke with his daughter this morning and they have found two different rehab facilities that they like. Advised her to speak with social work about this to get it going.   Temp:  [98.5 F (36.9 C)-99.7 F (37.6 C)] 98.7 F (37.1 C) (10/31 0801) Pulse Rate:  [37-81] 71 (10/31 0801) Resp:  [15-20] 20 (10/31 0801) BP: (122-149)/(59-78) 138/78 (10/31 0801) SpO2:  [98 %-100 %] 99 % (10/31 0801)    Sherryl Manges, NP 04/27/2020 9:31 AM

## 2020-04-27 NOTE — Progress Notes (Signed)
Pt was able to ambulated with this nurse and daughter assist from his room to the icu doors 128ft. Pt then sat up in chair about 2.5 hours. This nurse then helped him back to bed. He had a lot of difficulty standing each time but was able to ambulate. He stated that left knee was very painful on ambulation.

## 2020-04-28 MED ORDER — GABAPENTIN 300 MG PO CAPS
300.0000 mg | ORAL_CAPSULE | Freq: Three times a day (TID) | ORAL | Status: DC
  Administered 2020-04-28 – 2020-05-07 (×29): 300 mg via ORAL
  Filled 2020-04-28 (×29): qty 1

## 2020-04-28 NOTE — Plan of Care (Signed)

## 2020-04-28 NOTE — [place] FL2 (Signed)
[place] MEDICAID FL2 LEVEL OF CARE SCREENING TOOL     IDENTIFICATION  Patient Name: Marco Kennedy Birthdate: Feb 21, 1951 Sex: male Admission Date (Current Location): 04/18/2020  [facility] and [place] Number:   ([place])   Facility and Address:  [facility] [place], [address] [place], [place] [address]      Provider Number: [id number]  Attending Physician Name and Address:  [name], MD  Relative Name and Phone Number:       Current Level of Care: SNF Recommended Level of Care: Skilled Nursing Facility Prior Approval Number:    Date Approved/Denied:   PASRR Number:    Discharge Plan: SNF    Current Diagnoses: Patient Active Problem List   Diagnosis Date Noted  . Lumbar scoliosis 04/18/2020  . Back pain 12/25/2019  . S/P left THA, AA 06/15/2016    Orientation RESPIRATION BLADDER Height & Weight     Self, Time, Situation, Place  Normal Continent Weight: 238 lb (108 kg) Height:  5\' 7"  (170.2 cm)  BEHAVIORAL SYMPTOMS/MOOD NEUROLOGICAL BOWEL NUTRITION STATUS      Continent Diet (please see discharge summary)  AMBULATORY STATUS COMMUNICATION OF NEEDS Skin     Verbally Surgical wounds (closed incision abdominal,closed incision flank,left, closed incision  back)                       Personal Care Assistance Level of Assistance  Bathing, Feeding, Dressing Bathing Assistance: Limited assistance Feeding assistance: Independent Dressing Assistance: Limited assistance     Functional Limitations Info  Sight, Hearing, Speech Sight Info: Adequate Hearing Info: Adequate Speech Info: Adequate    SPECIAL CARE FACTORS FREQUENCY  PT (By licensed PT), OT (By licensed OT)     PT Frequency: 5x per week OT Frequency: 5x per week            Contractures Contractures Info: Not present    Additional Factors Info  Code Status, Allergies Code Status Info: Full Allergies Info: NKA           Current Medications (04/28/2020):   This is the current hospital active medication list Current Facility-Administered Medications  Medication Dose Route Frequency Provider Last Rate Last Admin  . 0.9 %  sodium chloride infusion  250 mL Intravenous Continuous 13/06/2019, MD      . acetaminophen (TYLENOL) tablet 650 mg  650 mg Oral Q4H PRN [name], MD       Or  . acetaminophen (TYLENOL) suppository 650 mg  650 mg Rectal Q4H PRN [name], MD      . alum & mag hydroxide-simeth (MAALOX/MYLANTA) 200-200-20 MG/5ML suspension 30 mL  30 mL Oral Q6H PRN 01-04-2001, MD      . bisacodyl (DULCOLAX) suppository 10 mg  10 mg Rectal Daily PRN [name], MD   10 mg at 04/23/20 0948  . Chlorhexidine Gluconate Cloth 2 % PADS 6 each  6 each Topical Daily 04/25/20, MD   6 each at 04/28/20 0915  . cholecalciferol (VITAMIN D3) tablet 1,000 Units  1,000 Units Oral Daily 13/01/21, MD   1,000 Units at 04/28/20 0909  . dextrose 5 % and 0.45 % NaCl with KCl 20 mEq/L infusion   Intravenous Continuous 0910, MD 75 mL/hr at 04/22/20 2056 New Bag at 04/22/20 2056  . docusate sodium (COLACE) capsule 100 mg  100 mg Oral BID 2057, MD   100 mg at 04/28/20 0909  . enoxaparin (LOVENOX) injection 40  mg  40 mg Subcutaneous Q24H Benita Gutter L, NP   40 mg at 04/28/20 0910  . gabapentin (NEURONTIN) capsule 300 mg  300 mg Oral TID [name], MD   300 mg at 04/28/20 1530  . HYDROcodone-acetaminophen (NORCO/VICODIN) 5-325 MG per tablet 2 tablet  2 tablet Oral Q4H PRN [name], MD   2 tablet at 04/28/20 1530  . HYDROmorphone (DILAUDID) injection 0.5 mg  0.5 mg Intravenous Q2H PRN [name], MD   0.5 mg at 04/22/20 1821  . influenza vaccine adjuvanted (FLUAD) injection 0.5 mL  0.5 mL Intramuscular Tomorrow-1000 [name], MD      . menthol-cetylpyridinium (CEPACOL) lozenge 3 mg  1 lozenge Oral PRN [name], MD       Or  . phenol (CHLORASEPTIC) mouth spray 1 spray  1 spray Mouth/Throat PRN [name],  MD      . methocarbamol (ROBAXIN) tablet 500 mg  500 mg Oral Q6H PRN [name], MD   500 mg at 04/28/20 1638   Or  . methocarbamol (ROBAXIN) 500 mg in dextrose 5 % 50 mL IVPB  500 mg Intravenous Q6H PRN [name], MD      . multivitamin with minerals tablet 1 tablet  1 tablet Oral Daily [name], MD   1 tablet at 04/28/20 0909  . ondansetron (ZOFRAN) tablet 4 mg  4 mg Oral Q6H PRN [name], MD       Or  . ondansetron [facility]) injection 4 mg  4 mg Intravenous Q6H PRN [name], MD      . oxyCODONE (Oxy IR/ROXICODONE) immediate release tablet 5 mg  5 mg Oral Q3H PRN [name], MD   5 mg at 04/22/20 4665  . pantoprazole (PROTONIX) EC tablet 40 mg  40 mg Oral Daily Ann Held, [place]   40 mg at 04/28/20 9935  . pneumococcal 23 valent vaccine (PNEUMOVAX-23) injection 0.5 mL  0.5 mL Intramuscular Tomorrow-1000 [name], MD      . polyethylene glycol [facility] / GLYCOLAX) packet 17 g  17 g Oral BID [name], MD   17 g at 04/28/20 0910  . polyethylene glycol (MIRALAX / GLYCOLAX) packet 17 g  17 g Oral Daily PRN [name], MD      . sodium chloride flush (NS) 0.9 % injection 3 mL  3 mL Intravenous Q12H [name], MD   3 mL at 04/28/20 0916  . sodium chloride flush (NS) 0.9 % injection 3 mL  3 mL Intravenous PRN [name], MD      . sodium phosphate (FLEET) 7-19 GM/118ML enema 1 enema  1 enema Rectal Once PRN [name], MD      . zolpidem Remus Loffler) tablet 5 mg  5 mg Oral QHS PRN [name], MD   5 mg at 04/27/20 2223     Discharge Medications: Please see discharge summary for a list of discharge medications.  Relevant Imaging Results:  Relevant Lab Results:   Additional Information SSN [id number]  Eduard Roux, LCSWA

## 2020-04-28 NOTE — Progress Notes (Signed)
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 PMH includes: THA 2017, sleep apnea.    OT comments  Pt seen in conjunction with PT to maximize pts activity tolerance. Pt continues to present with pain, decreased activity tolerance and weakness in BLES LLE>RLE impacting pts ability to complete BADLs. Pt requires most assist for sit<>stand needing up to MOD A +2 to power up into standing and for initial steadying assist to maneuver BLEs from sitting surface to RW.Pt completed x5 sit<>stands during session and completed functional mobility greater than a household distance with MIN A +2 for close chair follow. Pt currently requires total A for LB ADLs and MINA for UB ADLs. Pt would continue to benefit from skilled occupational therapy while admitted and after d/c to address the below listed limitations in order to improve overall functional mobility and facilitate independence with BADL participation. Will follow acutely per POC.    Follow Up Recommendations  CIR    Equipment Recommendations  Tub/shower seat    Recommendations for Other Services      Precautions / Restrictions Precautions Precautions: Back;Fall Precaution Booklet Issued: Yes (comment) Precaution Comments: reviewed precautions in context of ADL, pt able to state 3/3 precautions Required Braces or Orthoses: Spinal Brace Spinal Brace: Thoracolumbosacral orthotic;Applied in sitting position Restrictions Weight Bearing Restrictions: No       Mobility Bed Mobility Overal bed mobility: Needs Assistance Bed Mobility: Rolling;Sidelying to Sit Rolling: Mod assist Sidelying to sit: +2 for physical assistance;Max assist        General bed mobility comments: from a flat bed, may require less assist with HOB elevated. assist to move LE, and heavy assist to raise trunk from EOB  Transfers Overall transfer level: Needs assistance Equipment used: Rolling walker (2 wheeled) Transfers: Sit to/from Stand Sit to Stand: Mod assist;Min assist;+2 physical assistance Stand pivot transfers: Mod assist;+2 physical assistance       General transfer comment: cues for hand placement. modA of 2 initially with progression to minA of 2 at times. competed x5 through session with variation in performance. limited by LE and back pain. assist and cues to control lower to recliner    Balance Overall balance assessment: Needs assistance Sitting-balance support: Single extremity supported Sitting balance-Leahy Scale: Fair Sitting balance - Comments: able to maintain without UE support fhor short stints, pain limited   Standing balance support: Bilateral upper extremity supported;During functional activity Standing balance-Leahy Scale: Poor Standing balance comment: reliant on BUE support and modA external support                           ADL either performed or assessed with clinical judgement   ADL Overall ADL's : Needs assistance/impaired     Grooming: Wash/dry face;Set up;Sitting Grooming Details (indicate cue type and reason): from recliner         Upper Body Dressing : Minimal assistance;Sitting Upper Body Dressing Details (indicate cue type and reason): for TLSO management Lower Body Dressing: Total assistance;Sitting/lateral leans Lower Body Dressing Details (indicate cue type and reason): to don socks from recliner, pt unable to figure four, continued work on heel slides to facilitate figure four Toilet Transfer: Minimal assistance;+2  for physical assistance;+2 for safety/equipment;RW Toilet Transfer Details (indicate cue type and reason): simulated via functional mobility; MIN A +2 for safety with  ambulation and close chair follow and up to MOD A +2 to power into standing from sitting surface         Functional mobility during ADLs: Minimal assistance;Rolling walker;+2 for safety/equipment;Moderate assistance (up to MOD A +2 to power into standing) General ADL Comments: pt limited by pain, decreased activity tolerance and generalized weakness in LLE>RLE     Vision       Perception     Praxis      Cognition Arousal/Alertness: Awake/alert Behavior During Therapy: WFL for tasks assessed/performed Overall Cognitive Status: Within Functional Limits for tasks assessed                                 General Comments: pt agreeable and cooperative, verbalized understandinf of education, but poor application of techniques at times (due to pain or strength)        Exercises     Shoulder Instructions       General Comments VSS on RA, HR to 118bpm. pts wife present at end of session helpful and supportive.    Pertinent Vitals/ Pain       Pain Assessment: Faces Faces Pain Scale: Hurts even more Pain Location: back and L quad, especially with lifting LLE Pain Descriptors / Indicators: Discomfort;Sharp;Grimacing;Sore Pain Intervention(s): Limited activity within patient's tolerance;Monitored during session;Repositioned  Home Living                                          Prior Functioning/Environment              Frequency  Min 2X/week        Progress Toward Goals  OT Goals(current goals can now be found in the care plan section)  Progress towards OT goals: Progressing toward goals  Acute Rehab OT Goals Patient Stated Goal: reduce pain, hopeful for home OT Goal Formulation: With patient Time For Goal Achievement: 05/03/20 Potential to Achieve Goals: Good  Plan Discharge plan remains appropriate;Frequency remains appropriate    Co-evaluation      Reason for Co-Treatment: For patient/therapist safety;To address  functional/ADL transfers PT goals addressed during session: Mobility/safety with mobility;Balance;Strengthening/ROM OT goals addressed during session: ADL's and self-care      AM-PAC OT "6 Clicks" Daily Activity     Outcome Measure   Help from another person eating meals?: None Help from another person taking care of personal grooming?: A Little Help from another person toileting, which includes using toliet, bedpan, or urinal?: A Lot Help from another person bathing (including washing, rinsing, drying)?: A Lot Help from another person to put on and taking off regular upper body clothing?: A Lot Help from another person to put on and taking off regular lower body clothing?: A Lot 6 Click Score: 15    End of Session Equipment Utilized During Treatment: Rolling walker;Back brace  OT Visit Diagnosis: Other abnormalities of gait and mobility (R26.89);Pain;Unsteadiness on feet (R26.81)   Activity Tolerance Patient tolerated treatment well   Patient Left in chair;with call bell/phone within reach;with chair alarm set   Nurse Communication Mobility status        Time: 9528-4132 OT Time Calculation (min): 46 min  Charges: OT General Charges $OT  Visit: 1 Visit OT Treatments $Self Care/Home Management : 8-22 mins  Audery Amel., COTA/L Acute Rehabilitation Services [phone]    Angelina Pih 04/28/2020, 4:54 PM

## 2020-04-28 NOTE — Progress Notes (Signed)
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 PMH includes: THA 2017, sleep apnea.     PT Comments    The pt is continuing to make slow progress with mobility and PT goals at this time. He continues to present with deficits in strength, power, and stability that limit his ability to complete transfers without modA of 2 and use of RW. The pt was able to complete multiple short bouts of walking with seated rest in between each bout to increase recovery as well as to increase reps of sit-stand transfer practice. The pt was able to improve technique through today's session, but continues to require at least minA of 2 for each transfer at this time. The pt will continue to benefit from skilled PT acutely and following d/c to facilitate return to prior level of function and mobility.     Follow Up Recommendations  SNF;Supervision/Assistance - 24 hour (pt denied by CIR, will need continued rehab)     Equipment Recommendations  3in1 (PT) (defer to post acute)    Recommendations for Other Services       Precautions / Restrictions Precautions Precautions: Back;Fall Precaution Booklet Issued: Yes (comment) Precaution Comments: reviewed precautions in context of ADL Required Braces or Orthoses: Spinal Brace Spinal Brace: Thoracolumbosacral orthotic;Applied in sitting position Restrictions Weight Bearing Restrictions: No    Mobility  Bed Mobility Overal bed mobility: Needs Assistance Bed Mobility: Rolling;Sidelying to Sit Rolling: Mod assist Sidelying to sit: +2 for physical assistance;Max assist       General bed mobility comments: from a flat bed, may require less assist with HOB elevated.  assist to move LE, and heavy assist to raise trunk from EOB  Transfers Overall transfer level: Needs assistance Equipment used: Rolling walker (2 wheeled) Transfers: Sit to/from Stand Sit to Stand: Mod assist;Min assist;+2 physical assistance         General transfer comment: cues for hand placement. modA of 2 initially with progression to minA of 2 at times. competed x5 through session with variation in performance. limited by LE and back pain. assist and cues to control lower to recliner  Ambulation/Gait Ambulation/Gait assistance: Min assist;+2 safety/equipment Gait Distance (Feet): 50 Feet (x3) Assistive device: Rolling walker (2 wheeled) Gait Pattern/deviations: Step-through pattern;Decreased stride length Gait velocity: 0.15 m/s Gait velocity interpretation: <1.31 ft/sec, indicative of household ambulator General Gait Details: significantly increased relaince on UE. trunk flexed at hips with head down despite cues as pt fatigues. poor control of advancement of LLE, unable to control heel-toe pattern bilaterally     Balance Overall balance assessment: Needs assistance Sitting-balance support: Single extremity supported Sitting balance-Leahy Scale: Fair Sitting balance - Comments: able to maintain without UE support fhor short stints, pain limited   Standing balance support: Bilateral upper extremity supported;During functional activity Standing balance-Leahy Scale: Poor Standing balance comment: reliant on BUE support and modA external support                            Cognition Arousal/Alertness: Awake/alert Behavior During Therapy: WFL for tasks assessed/performed Overall Cognitive Status: Within Functional Limits for tasks assessed  General Comments: pt agreeable and cooperative, verbalized understandinf of education, but poor application of techniques at times (due to pain or strength)      Exercises       General Comments General comments (skin integrity, edema, etc.): VSS on RA, HR to 118bpm      Pertinent Vitals/Pain Pain Assessment: Faces Faces Pain Scale: Hurts even more Pain Location: back and L quad, especially with lifting LLE Pain Descriptors / Indicators: Discomfort;Sharp;Grimacing;Sore Pain Intervention(s): Limited activity within patient's tolerance;Monitored during session;Repositioned           PT Goals (current goals can now be found in the care plan section) Acute Rehab PT Goals Patient Stated Goal: reduce pain, hopeful for home PT Goal Formulation: With patient Time For Goal Achievement: 05/03/20 Potential to Achieve Goals: Good Progress towards PT goals: Progressing toward goals    Frequency    Min 5X/week      PT Plan Current plan remains appropriate    Co-evaluation PT/OT/SLP Co-Evaluation/Treatment: Yes Reason for Co-Treatment: For patient/therapist safety;To address functional/ADL transfers PT goals addressed during session: Mobility/safety with mobility;Balance;Strengthening/ROM        AM-PAC PT "6 Clicks" Mobility   Outcome Measure  Help needed turning from your back to your side while in a flat bed without using bedrails?: A Lot Help needed moving from lying on your back to sitting on the side of a flat bed without using bedrails?: A Lot Help needed moving to and from a bed to a chair (including a wheelchair)?: A Lot Help needed standing up from a chair using your arms (e.g., wheelchair or bedside chair)?: A Lot Help needed to walk in hospital room?: A Little Help needed climbing 3-5 steps with a railing? : A Lot 6 Click Score: 13    End of Session Equipment Utilized During Treatment: Back brace Activity Tolerance: Patient tolerated treatment well;Patient limited by fatigue;Patient limited by pain Patient left: in chair;with call bell/phone within reach;with chair alarm set;with family/visitor present Nurse Communication: Mobility  status PT Visit Diagnosis: Muscle weakness (generalized) (M62.81);Other abnormalities of gait and mobility (R26.89);Pain Pain - Right/Left: Left Pain - part of body: Leg     Time: 7628-3151 PT Time Calculation (min) (ACUTE ONLY): 49 min  Charges:  $Gait Training: 23-37 mins                     Rolm Baptise, PT, DPT   Acute Rehabilitation Department Pager #: [phone]   Gaetana Michaelis 04/28/2020, 4:42 PM

## 2020-04-28 NOTE — TOC Progression Note (Signed)
Transition of Care [facility]) - Progression Note    Patient Details  Name: Marco Kennedy MRN: [id number] Date of Birth: Nov 22, 1950  [facility]) CM/SW Contact  Eduard Roux, [place] Phone Number: 04/28/2020, 4:16 PM  Clinical Narrative:     Lowella Petties still reviewing clinicals.  Antony Blackbird, MSW, LCSWA Clinical Social Worker   Expected Discharge Plan: Skilled Nursing Facility Barriers to Discharge: Continued Medical Work up, English as a second language teacher, SNF Pending bed offer  Expected Discharge Plan and Services Expected Discharge Plan: Skilled Nursing Facility In-house Referral: Clinical Social Work                                             Social Determinants of Health (SDOH) Interventions    Readmission Risk Interventions No flowsheet data found.

## 2020-04-28 NOTE — TOC Progression Note (Signed)
Transition of Care [facility]) - Progression Note    Patient Details  Name: Marco Kennedy MRN: [id number] Date of Birth: 1951/04/24  Transition of Care [facility]) CM/SW Contact  Eduard Roux, [place] Phone Number: 04/28/2020, 1:33 PM  Clinical Narrative:     Faxed clinicals to [facility] # [phone] and [facility] Re[facility] # [phone] patient's family request.   CSW waiting on response  CSW will provide bed offer once available. Patient will need insurance authorization once bed is confirmed  Antony Blackbird, MSW, [employer] Clinical Social Worker   Expected Discharge Plan: Skilled Nursing Facility Barriers to Discharge: Continued Medical Work up, English as a second language teacher, SNF Pending bed offer  Expected Discharge Plan and Services Expected Discharge Plan: Skilled Nursing Facility In-house Referral: Clinical Social Work                                             Social Determinants of Health (SDOH) Interventions    Readmission Risk Interventions No flowsheet data found.

## 2020-04-28 NOTE — Progress Notes (Addendum)
Subjective: Patient reports that he has difficulty raising his left leg while supine and is experiencing some left quadriceps pain.   Objective: Vital signs in last 24 hours: Temp:  [98.1 F (36.7 C)-98.4 F (36.9 C)] 98.1 F (36.7 C) (11/01 0726) Pulse Rate:  [45-85] 67 (11/01 0726) Resp:  [14-20] 14 (11/01 0726) BP: (119-127)/(56-85) 119/68 (11/01 0726) SpO2:  [89 %-100 %] 100 % (11/01 0726)  Intake/Output from previous day: 10/31 0701 - 11/01 0700 In: 360 [P.O.:360] Out: 1375 [Urine:1375] Intake/Output this shift: Total I/O In: 240 [P.O.:240] Out: -   Physical Exam: Patient isawake,A/O X 4,and conversant. He MAEW with good strength with the exception of some continued chronic dorsiflexion weakness. Abdominal and thoracolumbar incisions areCDI. Abdomen is distended but soft. No abdominal pain to palpation. Does have some com[plaints of left quadriceps pain.  Lab Results: No results for input(s): WBC, HGB, HCT, PLT in the last 72 hours. BMET No results for input(s): NA, K, CL, CO2, GLUCOSE, BUN, CREATININE, CALCIUM in the last 72 hours.  Studies/Results: No results found.  Assessment/Plan: Patient is doing well overall. Does have some complaints of left quadriceps pain. Will begin gabapentin 300 mg TID to see if this will help with the patient's left quadriceps pain. Continue bowel regimen. Continue back brace when OOB. Insurance has denied CIR appeal. Family looking into SNF options. Continue working with therapies.       LOS: 10 days     Council Mechanic 04/28/2020, 9:16 AM   Patient would benefit from postoperative Rehab services.  Awaiting insurance approval and transfer.

## 2020-04-29 NOTE — Progress Notes (Signed)
Subjective: Patient reports that he had a good and restful night. He also reports hat he is having a return of his chronic preoperative knee pain during ambulation and stated that his knees "are bone on bone."  Objective: Vital signs in last 24 hours: Temp:  [98.4 F (36.9 C)-99.9 F (37.7 C)] 98.9 F (37.2 C) (11/02 0732) Pulse Rate:  [71-80] 72 (11/02 0732) Resp:  [12-18] 12 (11/02 0732) BP: (112-131)/(64-74) 131/74 (11/02 0732) SpO2:  [95 %-99 %] 99 % (11/02 0732)  Intake/Output from previous day: 11/01 0701 - 11/02 0700 In: 543 [P.O.:540; I.V.:3] Out: 1100 [Urine:1100] Intake/Output this shift: Total I/O In: -  Out: 525 [Urine:525]  Physical Exam: A/O X 4,conversant, and in good spirits this morning.He MAEW with good strength with the exception of some continued chronic dorsiflexion weakness. Abdominal and thoracolumbar incisions areCDI. Abdomen continues to be slightly distended but is soft. No abdominal pain to palpation. Continues to have some complaints of left quadriceps pain.  Lab Results: No results for input(s): WBC, HGB, HCT, PLT in the last 72 hours. BMET No results for input(s): NA, K, CL, CO2, GLUCOSE, BUN, CREATININE, CALCIUM in the last 72 hours.  Studies/Results: No results found.  Assessment/Plan: Patient is continuing to make steady progress. Continue bowel regimen. Continue back brace when OOB, working with therapies, and encouraging mobility. Patient would strongly benefit from postoperative rehabilitation. Awaiting placement.    LOS: 11 days     Council Mechanic, DNP, NP-C 04/29/2020, 8:13 AM

## 2020-04-29 NOTE — TOC Progression Note (Signed)
Transition of Care [facility]) - Progression Note    Patient Details  Name: Marco Kennedy MRN: [id number] Date of Birth: [birth year]  [facility]) CM/SW Contact  Marco Kennedy, [place] Phone Number: 04/29/2020, 1:30 PM  Clinical Narrative:     Marco Kennedy has confirmed bed offer- SNF starting insurance authorization today.   Marco Kennedy, MSW, LCSWA Clinical Social Worker   Expected Discharge Plan: Skilled Nursing Facility Barriers to Discharge: Continued Medical Work up, English as a second language teacher, SNF Pending bed offer  Expected Discharge Plan and Services Expected Discharge Plan: Skilled Nursing Facility In-house Referral: Clinical Social Work                                             Social Determinants of Health (SDOH) Interventions    Readmission Risk Interventions No flowsheet data found.

## 2020-04-29 NOTE — TOC Progression Note (Signed)
Transition of Care [facility]) - Progression Note    Patient Details  Name: Marco Kennedy MRN: [id number] Date of Birth: [birth year]  Transition of Care [facility]) CM/SW Contact  Eduard Roux, [place] Phone Number: 04/29/2020, 11:03 AM  Clinical Narrative:     CSW left voice message with Lowella Petties admission- waiting on response.  Antony Blackbird, MSW, LCSWA Clinical Social Worker   Expected Discharge Plan: Skilled Nursing Facility Barriers to Discharge: Continued Medical Work up, English as a second language teacher, SNF Pending bed offer  Expected Discharge Plan and Services Expected Discharge Plan: Skilled Nursing Facility In-house Referral: Clinical Social Work                                             Social Determinants of Health (SDOH) Interventions    Readmission Risk Interventions No flowsheet data found.

## 2020-04-30 NOTE — Progress Notes (Signed)
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 PMH includes: THA 2017, sleep apnea.     PT Comments    Pt has improved well since last week toward goals.  Generally needs moderate assist for most mobility, but the quality of movement and assistance needs has improved.  Emphasis on back education incl rolling, transitions, bracing issues/donning, sit to stand and gait stability.     Follow Up Recommendations  SNF;Supervision/Assistance - 24 hour     Equipment Recommendations  3in1 (PT)    Recommendations for Other Services       Precautions / Restrictions Precautions Precautions: Back;Fall Precaution Booklet Issued: Yes (comment) Precaution Comments: reviewed precautions in context of ADL, pt able to state 3/3 precautions Required Braces or Orthoses: Spinal Brace Spinal Brace: Thoracolumbosacral orthotic;Applied in sitting position Restrictions Weight Bearing Restrictions: No    Mobility  Bed Mobility Overal bed mobility: Needs Assistance Bed Mobility: Rolling;Sidelying to Sit Rolling: Min assist Sidelying to sit: Mod assist     Sit to sidelying: Mod assist General bed mobility comments: Worked on building momentum to roll and assisting more to come up from sidelying  Transfers Overall transfer level: Needs assistance Equipment used: Rolling walker (2 wheeled) Transfers: Sit to/from Stand Sit to Stand: Mod assist;+2 physical assistance;+2 safety/equipment         General transfer comment: increased assist to power up to stand, VCs for hand placement and technique  Ambulation/Gait Ambulation/Gait assistance: Min assist;+2 safety/equipment Gait Distance (Feet): 60 Feet  (, then sitting rest, before continuing form addt 140 feet) Assistive device: Rolling walker (2 wheeled) Gait Pattern/deviations: Step-through pattern;Decreased stride length   Gait velocity interpretation: <1.31 ft/sec, indicative of household ambulator General Gait Details: Continues to UE's on RW significantly with lower amplitude steps, slumped posture, breathing heavily   Stairs             Wheelchair Mobility    Modified Rankin (Stroke Patients Only)       Balance Overall balance assessment: Needs assistance Sitting-balance support: No upper extremity supported;Feet supported Sitting balance-Leahy Scale: Fair Sitting balance - Comments: worked on donning brace with little assist and no support.   Standing balance support: Bilateral upper extremity supported;During functional activity Standing balance-Leahy Scale: Poor Standing balance comment: overall reliant on UE support and external assist.  pt able to release 1 hand from the RW briefly to adjust the brace.                            Cognition Arousal/Alertness: Awake/alert Behavior During Therapy: WFL for tasks assessed/performed Overall Cognitive Status: Within Functional Limits for tasks assessed                                        Exercises      General Comments General comments (skin integrity, edema, etc.): vss      Pertinent Vitals/Pain Pain Assessment: Faces Faces Pain Scale: Hurts little more Pain Location: back and L quad, especially with lifting LLE Pain Descriptors / Indicators: Discomfort;Grimacing Pain Intervention(s): Limited activity within patient's tolerance  Home Living                      Prior Function            PT Goals (current goals can now be found in the care plan section) Acute Rehab PT Goals Patient Stated Goal: reduce pain, hopeful for home PT Goal Formulation: With patient Time For Goal Achievement: 05/03/20 Potential to  Achieve Goals: Good Progress towards PT goals: Progressing toward goals    Frequency    Min 5X/week      PT Plan Current plan remains appropriate    Co-evaluation              AM-PAC PT "6 Clicks" Mobility   Outcome Measure  Help needed turning from your back to your side while in a flat bed without using bedrails?: A Lot Help needed moving from lying on your back to sitting on the side of a flat bed without using bedrails?: A Lot Help needed moving to and from a bed to a chair (including a wheelchair)?: A Lot Help needed standing up from a chair using your arms (e.g., wheelchair or bedside chair)?: A Lot Help needed to walk in hospital room?: A Little Help needed climbing 3-5 steps with a railing? : A Lot 6 Click Score: 13    End of Session Equipment Utilized During Treatment: Back brace Activity Tolerance: Patient tolerated treatment well;Patient limited by fatigue;Patient limited by pain Patient left: in bed;Other (comment) (sitting EOB for OT) Nurse Communication: Mobility status PT Visit Diagnosis: Muscle weakness (generalized) (M62.81);Other abnormalities of gait and mobility (R26.89);Pain Pain - Right/Left: Left Pain - part of body: Leg     Time: 1253-1320 PT Time Calculation (min) (ACUTE ONLY): 27 min  Charges:  $Gait Training: 8-22 mins $Therapeutic Activity: 8-22 mins                     04/30/2020  Jacinto Halim., PT Acute Rehabilitation Services [phone]  (pager) [phone]  (office)   Marco Kennedy 04/30/2020, 6:25 PM

## 2020-04-30 NOTE — Progress Notes (Signed)
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 PMH includes: THA 2017, sleep apnea.    OT comments  Pt making gradual progress towards OT goals. He continues to require two person assist for sit<>stand transitions, but once upright able to mobilize with overall minA (+2 safety/chair). Pt engaging in seated grooming ADL with setup assist and education provided in compensatory techniques to ensure carryover of back precautions. He requires up to maxA for LB ADL at this time - may likely benefit from AE education. Have updated d/c recommendations as noted pt with CIR denial, now recommending SNF level therapies at time of discharge. Will continue to follow while pt acutely admitted.   Follow Up Recommendations  SNF;Supervision/Assistance - 24 hour    Equipment Recommendations  Tub/shower seat          Precautions / Restrictions Precautions Precautions: Back;Fall Precaution Booklet Issued: Yes (comment) Precaution Comments: reviewed precautions in context of ADL, pt able to state 3/3 precautions Required Braces or Orthoses: Spinal Brace Spinal Brace: Thoracolumbosacral orthotic;Applied in sitting position Restrictions Weight Bearing Restrictions: No       Mobility Bed Mobility Overal bed mobility: Needs Assistance Bed Mobility: Rolling;Sit to Sidelying Rolling: Min assist       Sit to sidelying: Mod assist General bed mobility comments: pt requiring increased assist to bring LEs onto EOB  Transfers Overall transfer level: Needs assistance Equipment used: Rolling walker (2 wheeled) Transfers: Sit to/from Stand Sit to Stand: Mod assist;+2 physical assistance;+2 safety/equipment          General transfer comment: increased assist to power up to stand, VCs for hand placement and technique    Balance Overall balance assessment: Needs assistance Sitting-balance support: No upper extremity supported;Feet supported Sitting balance-Leahy Scale: Fair     Standing balance support: Bilateral upper extremity supported;During functional activity Standing balance-Leahy Scale: Poor Standing balance comment: overall reliant on UE support and external assist                            ADL either performed or assessed with clinical judgement   ADL Overall ADL's : Needs assistance/impaired     Grooming: Wash/dry face;Set up;Sitting;Oral care Grooming Details (indicate cue type and reason): seated EOB, educated in compensatory techniques for oral care          Upper Body Dressing : Sitting;Min guard Upper Body Dressing Details (indicate cue type and reason): to doff TLSO                 Functional mobility during ADLs: Minimal assistance;Rolling walker;+2 for safety/equipment;Moderate assistance (modA+2 to stand, minA (+2 safety) with mobility)       Vision       Perception     Praxis      Cognition Arousal/Alertness: Awake/alert Behavior During Therapy: WFL for tasks assessed/performed Overall Cognitive Status: Within Functional Limits for tasks assessed                                          Exercises     Shoulder Instructions  General Comments      Pertinent Vitals/ Pain       Pain Assessment: Faces Faces Pain Scale: Hurts little more Pain Location: back and L quad, especially with lifting LLE Pain Descriptors / Indicators: Discomfort;Grimacing Pain Intervention(s): Limited activity within patient's tolerance;Monitored during session;Repositioned  Home Living                                          Prior Functioning/Environment              Frequency  Min 2X/week         Progress Toward Goals  OT Goals(current goals can now be found in the care plan section)  Progress towards OT goals: Progressing toward goals  Acute Rehab OT Goals Patient Stated Goal: reduce pain, hopeful for home OT Goal Formulation: With patient Time For Goal Achievement: 05/03/20 Potential to Achieve Goals: Good ADL Goals Pt Will Perform Grooming: with supervision;sitting;standing Pt Will Perform Lower Body Bathing: with min guard assist;sitting/lateral leans;sit to/from stand;with adaptive equipment Pt Will Perform Upper Body Dressing: with set-up;sitting Pt Will Perform Lower Body Dressing: with supervision;sit to/from stand;sitting/lateral leans;with adaptive equipment Pt Will Transfer to Toilet: with supervision;ambulating Pt Will Perform Toileting - Clothing Manipulation and hygiene: with supervision;sit to/from stand Additional ADL Goal #1: Pt will perform bed mobility at supervision level as precursor to EOB/OOB ADL.  Plan Discharge plan needs to be updated    Co-evaluation                 AM-PAC OT "6 Clicks" Daily Activity     Outcome Measure   Help from another person eating meals?: None Help from another person taking care of personal grooming?: A Little Help from another person toileting, which includes using toliet, bedpan, or urinal?: A Lot Help from another person bathing (including washing, rinsing, drying)?: A Lot Help from another person to put on and taking off regular upper body clothing?: A Lot Help from another person to put on and taking off regular lower body clothing?: A Lot 6 Click Score: 15    End of Session Equipment Utilized During Treatment: Rolling walker;Back brace  OT Visit Diagnosis: Other abnormalities of gait and mobility (R26.89);Pain;Unsteadiness on feet (R26.81) Pain - part of body:  (back)   Activity Tolerance Patient tolerated treatment well   Patient Left in bed;with call bell/phone within reach;with family/visitor  present   Nurse Communication Mobility status        Time: 4259-5638 OT Time Calculation (min): 27 min  Charges: OT General Charges $OT Visit: 1 Visit OT Treatments $Self Care/Home Management : 23-37 mins  Marcy Siren, OT Acute Rehabilitation Services Pager [phone] Office [phone]   [name] 04/30/2020, 5:55 PM

## 2020-04-30 NOTE — Progress Notes (Signed)
Subjective: Patient reports feeling a little weak while sitting on the side of the bed while eating breakfast this morning. He reported feeling better while sitting in the chair yesterday.   Objective: Vital signs in last 24 hours: Temp:  [97.6 F (36.4 C)-99.3 F (37.4 C)] 97.6 F (36.4 C) (11/03 0807) Pulse Rate:  [71-88] 88 (11/03 0807) Resp:  [15-18] 18 (11/03 0807) BP: (100-144)/(60-84) 100/84 (11/03 0807) SpO2:  [99 %-100 %] 99 % (11/03 0807)  Intake/Output from previous day: 11/02 0701 - 11/03 0700 In: 713 [P.O.:710; I.V.:3] Out: 625 [Urine:625] Intake/Output this shift: No intake/output data recorded.  Physical Exam: He is A/O X 4 and is conversant.He MAEW with good strength with the exception of somecontinuedchronic dorsiflexion weakness. Abdominal and thoracolumbar incisions areCDI. No abdominal pain to palpation.Continues to have some complaints of left quadriceps pain. Sitting on side of bed with back brace on.   Lab Results: No results for input(s): WBC, HGB, HCT, PLT in the last 72 hours. BMET No results for input(s): NA, K, CL, CO2, GLUCOSE, BUN, CREATININE, CALCIUM in the last 72 hours.  Studies/Results: No results found.  Assessment/Plan: He is continuing to improve and make forward progress. Continue bowel regimen. Continue back brace when OOB, working with therapies, and encouraging mobility. Plan for discharge soon to SNF in [place], [place]. Awaiting insurance approval.    LOS: 12 days     Council Mechanic, DNP, NP-C 04/30/2020, 9:30 AM

## 2020-05-01 NOTE — Progress Notes (Signed)
Pt is able to place himself on BIPAP dream station with the settings of 23/19 W 3 lpm bled into the system. RT expressed to pt to call if he needed assistance.Pts respiratory status is stable at this time w/no distress noted.  RT will continue to monitor.

## 2020-05-01 NOTE — Progress Notes (Signed)
Subjective: Patient reports that he had a good day yesterday and a restful night. He does continue to have some complaints of right thigh and bilateral knee pain.   Objective: Vital signs in last 24 hours: Temp:  [98.7 F (37.1 C)-99.2 F (37.3 C)] 98.7 F (37.1 C) (11/04 0320) Pulse Rate:  [73-93] 73 (11/04 0320) Resp:  [18] 18 (11/03 1613) BP: (106-140)/(54-71) 125/67 (11/04 0320) SpO2:  [95 %-100 %] 100 % (11/04 0320)  Intake/Output from previous day: 11/03 0701 - 11/04 0700 In: 770 [P.O.:720; I.V.:50] Out: 1950 [Urine:1950] Intake/Output this shift: No intake/output data recorded.  Physical Exam:A/O X 4 and is conversant.MAEW with good strength with the exception of chronic dorsiflexion weakness. Abdominal and thoracolumbar incisions areCDI. No abdominal pain to palpation.Continues tohave some complaints of left quadriceps pain. Also has complaints of bilateral knee pain, worse with ambulation.   Lab Results: No results for input(s): WBC, HGB, HCT, PLT in the last 72 hours. BMET No results for input(s): NA, K, CL, CO2, GLUCOSE, BUN, CREATININE, CALCIUM in the last 72 hours.  Studies/Results: No results found.  Assessment/Plan: He is continuing to make forward progress. Continue bowel regimen. Continue back brace when OOB, working with therapies, and encouraging mobility. Plan for discharge  to SNF in [place], [place]. once insurance approval is received.     LOS: 13 days     Council Mechanic, DNP, NP-C 05/01/2020, 8:08 AM

## 2020-05-01 NOTE — Progress Notes (Signed)
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 PMH includes: THA 2017, sleep apnea.     PT Comments    Pt progressing steadily, but slowly toward goals. Emphasis on education, sit to stand technique, still limited by an unstable/painful L knee and progressing gait stability/posture and stamina.    Follow Up Recommendations  SNF;Supervision/Assistance - 24 hour     Equipment Recommendations  3in1 (PT)    Recommendations for Other Services       Precautions / Restrictions Precautions Precautions: Back;Fall Precaution Booklet Issued: Yes (comment) Precaution Comments: reviewed precautions in context of ADL, pt able to state 3/3 precautions Required Braces or Orthoses: Spinal Brace Spinal Brace: Thoracolumbosacral orthotic;Applied in sitting position    Mobility  Bed Mobility Overal bed mobility: Needs Assistance Bed Mobility: Rolling;Sidelying to Sit Rolling: Min assist Sidelying to sit: Mod assist       General bed mobility comments: Worked on building momentum to roll and assisting more to come up from sidelying  Transfers Overall transfer level: Needs assistance Equipment used: Rolling walker (2 wheeled) Transfers: Sit to/from Stand Sit to Stand: Mod assist;+2 safety/equipment         General transfer comment: increased assist to power up to stand, VCs for hand placement and technique  Ambulation/Gait Ambulation/Gait assistance: Min assist Gait Distance (Feet): 85 Feet (x2) Assistive device: Rolling walker (2 wheeled) Gait Pattern/deviations: Step-through pattern;Decreased stride length   Gait velocity interpretation: <1.31 ft/sec, indicative of household  ambulator General Gait Details: Continues to UE's on RW significantly with lower amplitude steps, slumped posture, breathing heavily.  Can attain a submaximal upright posture in stance with release of 1 UE, but not with ambulation.   Stairs             Wheelchair Mobility    Modified Rankin (Stroke Patients Only)       Balance Overall balance assessment: Needs assistance Sitting-balance support: No upper extremity supported;Feet supported Sitting balance-Leahy Scale: Fair Sitting balance - Comments: worked on donning brace with little assist and no support.     Standing balance-Leahy Scale: Poor Standing balance comment: overall reliant on UE support and external assist.  pt able to release 1 hand from the RW briefly to adjust the brace.                            Cognition Arousal/Alertness: Awake/alert Behavior During Therapy: WFL for tasks assessed/performed Overall Cognitive Status: Within Functional Limits for tasks assessed                                        Exercises      General Comments        Pertinent Vitals/Pain Pain Assessment: Faces Faces Pain Scale: Hurts little more Pain Location: back and L quad, especially with lifting LLE Pain Descriptors / Indicators: Discomfort;Grimacing Pain Intervention(s): Monitored during session    Home Living                      Prior Function  PT Goals (current goals can now be found in the care plan section) Acute Rehab PT Goals PT Goal Formulation: With patient Time For Goal Achievement: 05/03/20 Potential to Achieve Goals: Good Progress towards PT goals: Progressing toward goals    Frequency    Min 5X/week      PT Plan Current plan remains appropriate    Co-evaluation              AM-PAC PT "6 Clicks" Mobility   Outcome Measure  Help needed turning from your back to your side while in a flat bed without using bedrails?: A Lot Help  needed moving from lying on your back to sitting on the side of a flat bed without using bedrails?: A Lot Help needed moving to and from a bed to a chair (including a wheelchair)?: A Lot Help needed standing up from a chair using your arms (e.g., wheelchair or bedside chair)?: A Lot Help needed to walk in hospital room?: A Little Help needed climbing 3-5 steps with a railing? : A Lot 6 Click Score: 13    End of Session Equipment Utilized During Treatment: Back brace Activity Tolerance: Patient tolerated treatment well;Patient limited by fatigue;Patient limited by pain Patient left: in chair;with call bell/phone within reach Nurse Communication: Mobility status PT Visit Diagnosis: Muscle weakness (generalized) (M62.81);Other abnormalities of gait and mobility (R26.89);Pain Pain - Right/Left: Left Pain - part of body: Leg;Knee     Time: 5409-8119 PT Time Calculation (min) (ACUTE ONLY): 20 min  Charges:  $Gait Training: 8-22 mins                     05/01/2020  Marco Kennedy., PT Acute Rehabilitation Services [phone]  (pager) [phone]  (office)   Marco Kennedy 05/01/2020, 3:15 PM

## 2020-05-02 NOTE — Progress Notes (Signed)
Oral temp 100.6, tylenol administered as ordered in eMAR. No s/sx of distress evident. Re-assessment completed, pt found with increased drainage from posterior incisions. Partial linen change and honeycomb dressings replaced.

## 2020-05-02 NOTE — Progress Notes (Signed)
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 PMH includes: THA 2017, sleep apnea; L knee pain.    OT comments  Pt continues to make steady progress - goals remain appropriate. After donning L knee brace, pt able to transfer with min A from recliner, ambulate to bathroom, complete standing grooming task then ambulate to 3in1 over toilet with min guard A. Max A with pericare after toileting - pt will need AE to assist with self care. Pt will benefit from post acute rehab to facilitate safe DC home. Will continue to follow acutely.   Follow Up Recommendations  SNF;Supervision/Assistance - 24 hour    Equipment Recommendations  Tub/shower seat    Recommendations for Other Services      Precautions / Restrictions Precautions Precautions: Back;Fall Precaution Booklet Issued: Yes (comment) Precaution Comments: reviewed precautions in context of ADL, pt able to state 3/3 precautions Required Braces or Orthoses: Spinal Brace Spinal Brace: Thoracolumbosacral orthotic;Applied in sitting position       Mobility Bed Mobility               General bed mobility comments: OOB in chair  Transfers Overall transfer level: Needs assistance   Transfers: Sit to/from Stand Sit to Stand: Min assist;+2 safety/equipment         General transfer comment: Initially required Min A +2 for for transfer from chair. Pt continued to improve as session progressed.    Balance     Sitting balance-Leahy Scale: Good       Standing balance-Leahy Scale: Poor                             ADL either performed or assessed with clinical judgement   ADL Overall ADL's : Needs assistance/impaired      Grooming: Min guard;Standing   Upper Body Bathing: Min guard;Sitting   Lower Body Bathing: Moderate assistance;Sit to/from stand           Toilet Transfer: Minimal assistance;Cueing for safety;Cueing for sequencing;RW;Ambulation;BSC (over toilet)   Toileting- Clothing Manipulation and Hygiene: Moderate assistance Toileting - Clothing Manipulation Details (indicate cue type and reason): unable to reach bottom to clean after BM; Educated on useof toilet tong and flusheable wipes; Educated pt/wife on availability of ordering AE on [employer] if needed.      Functional mobility during ADLs: Minimal assistance;Rolling walker;Cueing for safety;Cueing for sequencing (Min A from higher surfaces) General ADL Comments: Educatedon need for AE in order to complete self care     Vision       Perception     Praxis      Cognition Arousal/Alertness: Awake/alert Behavior During Therapy: WFL for tasks assessed/performed Overall Cognitive Status: Within Functional Limits for tasks assessed                                          Exercises Exercises: Other exercises Other Exercises Other Exercises: chair marching Other Exercises: hip/ad/abd with pillow between legs Other Exercises: encouraged chair and bed level activity; especially heel slides in bed to help progress mobility for ADL  Shoulder Instructions       General Comments wife present for session adn encouraging    Pertinent Vitals/ Pain       Pain Assessment: No/denies pain Pain Score: 7  Pain Location: L knee; back Pain Descriptors / Indicators: Discomfort;Grimacing Pain Intervention(s): Limited activity within patient's tolerance;Repositioned;Other (comment) (brace used L knee)  Home Living                                          Prior Functioning/Environment              Frequency  Min 2X/week        Progress Toward Goals  OT Goals(current goals can now be found in  the care plan section)  Progress towards OT goals: Progressing toward goals  Acute Rehab OT Goals Patient Stated Goal: reduce pain, hopeful for home OT Goal Formulation: With patient Time For Goal Achievement: 05/03/20 Potential to Achieve Goals: Good ADL Goals Pt Will Perform Grooming: with supervision;sitting;standing Pt Will Perform Lower Body Bathing: with min guard assist;sitting/lateral leans;sit to/from stand;with adaptive equipment Pt Will Perform Upper Body Dressing: with set-up;sitting Pt Will Perform Lower Body Dressing: with supervision;sit to/from stand;sitting/lateral leans;with adaptive equipment Pt Will Transfer to Toilet: with supervision;ambulating Pt Will Perform Toileting - Clothing Manipulation and hygiene: with supervision;sit to/from stand Additional ADL Goal #1: Pt will perform bed mobility at supervision level as precursor to EOB/OOB ADL.  Plan Discharge plan remains appropriate    Co-evaluation                 AM-PAC OT "6 Clicks" Daily Activity     Outcome Measure   Help from another person eating meals?: None Help from another person taking care of personal grooming?: A Little Help from another person toileting, which includes using toliet, bedpan, or urinal?: A Lot Help from another person bathing (including washing, rinsing, drying)?: A Lot Help from another person to put on and taking off regular upper body clothing?: A Lot Help from another person to put on and taking off regular lower body clothing?: A Lot 6 Click Score: 15    End of Session Equipment Utilized During Treatment: Gait belt;Rolling walker;Back brace  OT Visit Diagnosis: Other abnormalities of gait and mobility (R26.89);Pain;Unsteadiness on feet (R26.81) Pain - Right/Left: Left Pain - part of body: Knee   Activity Tolerance Patient tolerated treatment well   Patient Left in chair;with call bell/phone within reach;with family/visitor present   Nurse Communication Mobility  status        Time: 3149-7026 OT Time Calculation (min): 36 min  Charges: OT General Charges $OT Visit: 1 Visit OT Treatments $Self Care/Home Management : 23-37 mins  Luisa Dago, OT/L   Acute OT Clinical Specialist Acute Rehabilitation Services Pager [phone] Office [phone]    [facility] 05/02/2020, 2:49 PM

## 2020-05-02 NOTE — Progress Notes (Signed)
Pt is able to place himself on BIPAP dream station w/out any assistance from RT. Pt settings 23/19 w/3 Lpm bled into the system. RT will continue to monitor.

## 2020-05-02 NOTE — Progress Notes (Signed)
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 PMH includes: THA 2017, sleep apnea.     PT Comments    Notable improvement toward goals.  Pt starting to need less assist to stand which has been a problem due to general weakness and L knee pain.  Emphasis on general education, specific work on standing/technique, progressing gait with stress on posture and improved use of the RW.   Follow Up Recommendations  SNF;Supervision/Assistance - 24 hour     Equipment Recommendations  3in1 (PT);Other (comment) (TBA)    Recommendations for Other Services       Precautions / Restrictions Precautions Precautions: Back;Fall Precaution Booklet Issued: Yes (comment) Precaution Comments: reviewed precautions in context of ADL, pt able to state 3/3 precautions Required Braces or Orthoses: Spinal Brace Spinal Brace: Thoracolumbosacral orthotic;Applied in sitting position    Mobility  Bed Mobility               General bed mobility comments: oob in the chair on arrival  Transfers Overall transfer level: Needs assistance Equipment used: Rolling walker (2 wheeled) Transfers: Sit to/from Stand Sit to Stand: Mod assist;Min assist         General transfer comment: Improving technique and slowing improving strength now needing less assist overall  Ambulation/Gait Ambulation/Gait assistance: Min assist;Min guard Gait Distance (Feet): 85 Feet (2) Assistive device: Rolling walker (2 wheeled) Gait Pattern/deviations: Step-through pattern;Decreased stride length   Gait velocity interpretation: <1.8 ft/sec, indicate of risk for recurrent falls General Gait Details: Gait still effortful overall,  but pt starting to show higher stepping/more clearance L LE, pt able to increase his sustained speed.  Still needing frequent cues for uprigh posture.  Pt unable to attaiin or hold upright posture during gait.   Stairs             Wheelchair Mobility    Modified Rankin (Stroke Patients Only)       Balance Overall balance assessment: Needs assistance Sitting-balance support: No upper extremity supported;Feet supported Sitting balance-Leahy Scale: Fair Sitting balance - Comments: worked on donning brace with little assist and no support.     Standing balance-Leahy Scale: Poor Standing balance comment: overall reliant on UE support and external assist.  pt able to release 1 hand from the RW for extended periods to reach out/up/                            Cognition Arousal/Alertness: Awake/alert Behavior During Therapy: [facility] for tasks assessed/performed Overall Cognitive Status: Within Functional Limits for tasks assessed                                        Exercises Other Exercises Other Exercises: chair marching Other Exercises: hip/ad/abd with pillow between legs Other Exercises: encouraged chari and bed level activity; especially heel slides in bed to help progress mobility for ADL    General Comments General comments (skin integrity, edema, etc.): wife present.  Adjusted the brace to handle a smaller girth      Pertinent Vitals/Pain Pain Assessment: Faces Pain Score: 7  Faces Pain Scale: Hurts even more Pain Location: back and L quad, especially with lifting LLE Pain Descriptors / Indicators: Discomfort;Grimacing Pain Intervention(s): Limited activity within patient's tolerance;Monitored during session    Home Living                      Prior Function            PT Goals (current goals can now be found in the care plan section) Acute Rehab PT Goals Patient Stated Goal: reduce pain, hopeful for home PT Goal  Formulation: With patient Time For Goal Achievement: 05/03/20 Potential to Achieve Goals: Good Progress towards PT goals: Progressing toward goals    Frequency    Min 5X/week      PT Plan Current plan remains appropriate    Co-evaluation              AM-PAC PT "6 Clicks" Mobility   Outcome Measure  Help needed turning from your back to your side while in a flat bed without using bedrails?: A Lot Help needed moving from lying on your back to sitting on the side of a flat bed without using bedrails?: A Lot Help needed moving to and from a bed to a chair (including a wheelchair)?: A Little Help needed standing up from a chair using your arms (e.g., wheelchair or bedside chair)?: A Little Help needed to walk in hospital room?: A Little Help needed climbing 3-5 steps with a railing? : A Lot 6 Click Score: 15    End of Session Equipment Utilized During Treatment: Back brace Activity Tolerance: Patient tolerated treatment well;Patient limited by fatigue;Patient limited by pain Patient left: in chair;with call bell/phone within reach Nurse Communication: Mobility status PT Visit Diagnosis: Muscle weakness (generalized) (M62.81);Other abnormalities of gait and mobility (R26.89);Pain Pain - Right/Left: Left Pain - part of body: Leg;Knee     Time: 1345-1414 PT Time Calculation (min) (ACUTE ONLY): 29 min  Charges:  $Gait Training: 8-22 mins $Therapeutic Activity: 8-22 mins                     05/02/2020  Jacinto Halim., PT Acute Rehabilitation Services [phone]  (pager) [phone]  (office)   Eliseo Gum Phyillis Dascoli 05/02/2020, 3:06 PM

## 2020-05-02 NOTE — Discharge Summary (Addendum)
Physician Discharge Summary  [name] MRN: [id number] DOB/AGE: 69-Oct-1952 69 y.o.  Admit date: 04/18/2020 Discharge date: 05/02/2020  Admission Diagnoses: Scoliosis and kyphoscoliosis, Idiopathic, lumbar stenosis, spondylolisthesis, lumbago, radiculopathy  Discharge Diagnoses: Scoliosis and kyphoscoliosis, Idiopathic, lumbar stenosis, spondylolisthesis, lumbago, radiculopathy  Active Problems:   Lumbar scoliosis   Discharged Condition: good  Hospital Course: The patient was admitted on 04/18/2020 and taken to the operating room where the patient underwent a staged procedure on 10/22 and 10/25. Stage 1 consisted of  ALIF L4-5 and L5-S1 levels with left-sided XLIF at L2-3 and L3-4 levels. Stage 2  consisted of T10 to pelvis pedicle screw fixation with osteotomies and posterior lateral arthrodesis with autograft and allograft with application of intraoperative CT scan. The patient tolerated the procedure well and was taken to the recovery room and then to the floor in stable condition. The hospital course was routine. There were no complications. The wounds remained clean dry and intact. Pt had appropriate back soreness. No complaints of new N/T/W. He did have complaints of left quadriceps pain for which he was started on gabapentin. The patient remained with stable vital signs, and tolerated a regular diet. The patient continued to slowly increase activities, and pain was well controlled with oral pain medications. Hospital stay was extended due to insurance denial of CIR and prolonged wait time due to insurance approval of SNF.  Consults: Vascular Surgery  Significant Diagnostic Studies: radiology: X-Ray, intraoperative CT   Treatments: surgery: Lumbar Four-Five Lumbar Five- Sacral One Anterior lumbar Interbody Fusion (N/A) ANTERIOR LATERAL LUMBAR FUSION LUMBAR TWO-THREE, LUMBAR THREE-FOUR (Left) ABDOMINAL EXPOSURE (N/A)  Thoracic ten to pelvis pedicle screw fixation with  osteotomies (N/A) APPLICATION OF INTRAOPERATIVE CT SCAN (N/A) posterolateral arthrodesis with autograft and allograft  Discharge Exam: Blood pressure (!) 111/59, pulse 79, temperature 99 F (37.2 C), resp. rate 18, height 5\' 7"  (1.702 m), weight 108 kg, SpO2 95 %.  Physical Exam: He is A/O X 4 and is conversant. He MAEW with good strength with the exception of some continued chronic dorsiflexion weakness. Abdominal and thoracolumbar incisions are CDI. No abdominal pain to palpation. Continues to have some complaints of left quadriceps pain.  Disposition: SNF   Allergies as of 05/02/2020   No Known Allergies     Medication List    STOP taking these medications   diclofenac 75 MG EC tablet Commonly known as: VOLTAREN   HYDROcodone-acetaminophen 7.5-325 MG tablet Commonly known as: NORCO     TAKE these medications   acetaminophen 500 MG tablet Commonly known as: TYLENOL Take 1,000 mg by mouth every 6 (six) hours as needed for moderate pain.   cholecalciferol 25 MCG (1000 UNIT) tablet Commonly known as: VITAMIN D3 Take 1,000 Units by mouth daily.   docusate sodium 100 MG capsule Commonly known as: COLACE Take 1 capsule (100 mg total) by mouth 2 (two) times daily.   ferrous sulfate 325 (65 FE) MG tablet Take 1 tablet (325 mg total) by mouth 3 (three) times daily after meals.   methocarbamol 500 MG tablet Commonly known as: ROBAXIN Take 1 tablet (500 mg total) by mouth every 6 (six) hours as needed for muscle spasms.   multivitamin with minerals Tabs tablet Take 1 tablet by mouth daily.   OSTEO BI-FLEX REGULAR STRENGTH PO Take 2 capsules by mouth daily.   polyethylene glycol 17 g packet Commonly known as: MIRALAX / GLYCOLAX Take 17 g by mouth 2 (two) times daily.   vitamin C 100 MG  tablet Take 100 mg by mouth daily.        Signed: Council Mechanic, DNP, NP-C 05/02/2020, 9:10 AM

## 2020-05-02 NOTE — Progress Notes (Signed)
Subjective: Patient reports a low-grade fever overnight, but otherwise is doing well.   Objective: Vital signs in last 24 hours: Temp:  [97.6 F (36.4 C)-100.6 F (38.1 C)] 99 F (37.2 C) (11/05 0811) Pulse Rate:  [78-95] 79 (11/05 0811) Resp:  [18-19] 18 (11/05 0811) BP: (111-142)/(55-74) 111/59 (11/05 0811) SpO2:  [94 %-100 %] 95 % (11/05 0811)  Intake/Output from previous day: 11/04 0701 - 11/05 0700 In: 560 [P.O.:560] Out: 250 [Urine:250] Intake/Output this shift: No intake/output data recorded.  Physical Exam:He is A/O X 4 and is conversant.He MAEW with good strength with the exception of somecontinuedchronic dorsiflexion weakness. RN reporting some drainage from his thoracolumbar incisions overnight. This morning, abdominal and thoracolumbar incisions areCDI. No abdominal pain to palpation.Continues tohave some complaints of left quadriceps pain.  Lab Results: No results for input(s): WBC, HGB, HCT, PLT in the last 72 hours. BMET No results for input(s): NA, K, CL, CO2, GLUCOSE, BUN, CREATININE, CALCIUM in the last 72 hours.  Studies/Results: No results found.  Assessment/Plan: The patient is making slow forward progress. Continue bowel regimen.Continue back brace when OOB, working with therapies, and encouraging mobility and ambulation. Low-grade fever overnight reported by RN and patient. Thoracolumbar incision drainage overnight reported by RN. Dressing changed and is CDI. Continue monitoring incisions. Plan for discharge  to SNF in [place], [place]. once insurance approval is received.    LOS: 14 days     Council Mechanic, DNP, NP-C 05/02/2020, 9:05 AM

## 2020-05-02 NOTE — TOC Progression Note (Signed)
Transition of Care [facility]) - Progression Note    Patient Details  Name: Marco Kennedy MRN: [id number] Date of Birth: 1951/04/11  Transition of Care [facility]) CM/SW Contact  Eduard Roux, [place] Phone Number: 05/02/2020, 9:56 AM  Clinical Narrative:     CSW called [facility] to follow up on insurance auth-waiting on response  Antony Blackbird, MSW, [employer] Clinical Social Worker   Expected Discharge Plan: Skilled Nursing Facility Barriers to Discharge: Continued Medical Work up, English as a second language teacher, SNF Pending bed offer  Expected Discharge Plan and Services Expected Discharge Plan: Skilled Nursing Facility In-house Referral: Clinical Social Work                                             Social Determinants of Health (SDOH) Interventions    Readmission Risk Interventions No flowsheet data found.

## 2020-05-03 NOTE — Progress Notes (Signed)
Neurosurgery Service Progress Note  Subjective: No acute events overnight, had some dark drainage out of his wound near the midpoint but was cleaned and dressing changed before rounds   Objective: Vitals:   05/02/20 2341 05/03/20 0451 05/03/20 0745 05/03/20 1128  BP: (!) 122/58 (!) 146/94 132/66 129/80  Pulse: 99 86 85 81  Resp:  18 16 16   Temp: 98.8 F (37.1 C) 99 F (37.2 C) 98.6 F (37 C) 98.2 F (36.8 C)  TempSrc:  Oral    SpO2: 96% 99% 99% 100%  Weight:      Height:        Physical Exam: Strength 5/5 in RLE except R EHL 3/5, LLE severely pain limited in L HF, otherwise 4+/5 except 3/5 in EHL Sensation grossly intact  Assessment & Plan: 69 y.o. man s/p scoliosis correction, recovering well.  -SNF placement pending, no change in plan of care, on enoxaparin for DVT PPx  78  05/03/20 12:07 PM

## 2020-05-03 NOTE — Progress Notes (Signed)
CSW undersigned provided supervision for MSW intern in process of providing TOC services and intervention.   Rosette Reveal, LCSW, LCAS Clinical Social Worker II Emergency Department, [facility] Transitions of Care Department, [facility] [phone]   Transition of Care [facility]) - Progression Note    Patient Details  Name: Marco Kennedy MRN: [id number] Date of Birth: 11/08/1977  Transition of Care [facility]) CM/SW Contact  Vilinda Blanks, Student-Social Work Phone Number: [phone] 05/03/2020, 11:56 AM  Clinical Narrative:     MSW Intern contacted [facility] @[phone]  and spoke with regarding insurance auth. Per Adron Bene has not been received and patient will not be able to come to facility this weekend. He asked for [facility] staff to please call back on Monday 11/8 to check on authorization status.  TOC will continue to follow for discharge supports.         Expected Discharge Plan and Services                                                 Social Determinants of Health (SDOH) Interventions    Readmission Risk Interventions No flowsheet data found.

## 2020-05-03 NOTE — Progress Notes (Signed)
Pt is able to place himself on BIPAP dream station w/out assistance from RT. RT will continue to monitor.

## 2020-05-04 NOTE — Plan of Care (Signed)

## 2020-05-04 NOTE — Progress Notes (Signed)
Neurosurgery Service Progress Note  Subjective: No acute events overnight   Objective: Vitals:   05/03/20 2357 05/03/20 2359 05/04/20 0418 05/04/20 0823  BP:  111/62 (!) 143/75 (!) 142/86  Pulse:  88 75 88  Resp:  20 20 20   Temp: 99.6 F (37.6 C)  (!) 97.5 F (36.4 C) 99.3 F (37.4 C)  TempSrc: Oral  Oral   SpO2:  95% 100% 98%  Weight:      Height:        Physical Exam: Strength 5/5 in RLE except R EHL 3/5, LLE severely pain limited in L HF, otherwise 4+/5 except 3/5 in EHL Sensation grossly intact  Assessment & Plan: 69 y.o. man s/p scoliosis correction, recovering well.  -SNF placement pending, no change in plan of care, on enoxaparin for DVT PPx -TOC staff to check tomorrow regarding insurance auth re transfer to SNF  69  05/04/20 10:42 AM

## 2020-05-04 NOTE — Progress Notes (Signed)
Pt states he can place CPAP on himself when ready for bed. Advised pt to notify for RT if any further assistance is needed.  °

## 2020-05-05 MED ORDER — PNEUMOCOCCAL VAC POLYVALENT 25 MCG/0.5ML IJ INJ
0.5000 mL | INJECTION | Freq: Once | INTRAMUSCULAR | Status: AC
  Administered 2020-05-05: 0.5 mL via INTRAMUSCULAR

## 2020-05-05 MED ORDER — MAGIC MOUTHWASH W/LIDOCAINE
5.0000 mL | Freq: Three times a day (TID) | ORAL | Status: DC | PRN
  Administered 2020-05-05 – 2020-05-06 (×3): 5 mL via ORAL
  Filled 2020-05-05 (×5): qty 5

## 2020-05-05 NOTE — TOC Progression Note (Signed)
Transition of Care [facility]) - Progression Note    Patient Details  Name: KRIST ROSENBOOM MRN: [id number] Date of Birth: 11/26/1950  Transition of Care [facility]) CM/SW Contact  Eduard Roux, [place] Phone Number: 05/05/2020, 5:04 PM  Clinical Narrative:     10:01am Received call from patient's wife, inquired about status of insurance. CSW advised, insurance still pending   10:46am CSW called [facility] to inquire about insurance status. CSW was informed, insurance is pending. SNF responsible for getting authorization but they also encouraged the wife to call to possibly speed up the process.  4:13pm-CSW received call form the patient's wife, insurance states they have not received faxed information that was provided last week-updated clinicals/PT notes are needed.   CSW called patient's insurance-spoke with Toniann Fail - she advised clinicals were not received. CSW advised clinicals were faxed and fax confirmation was received. Toniann Fail explained UM# must be included, which was not provided to CSW. Toniann Fail transferred call to [facility] # [phone] , CSW left voice message. CSW was instructed to fax updated clinicals to [phone], [phone].   CSW called wife and provided update and inquired if the patient could go home. She states she can not provide the level of care needed for patient at this time. She can not care for him he is too frail. She states because of his large size and high fall risk, if he was to fall at home she could not help him and he could potentially do more harm to himself. She works during the day and  explains he is not strong enough and too weak to mobilize home alone. He is requiring assistance ambulating here at the hospital. She reports she could not transport the patient home because he can not get in and out of vehicle, he would require non-emergency transport. She states she needs pateint to go to rehab and get stronger before going home.  CSW will continue to follow  and assist with discharge planning. CSW will follow up with insurance tomorrow morning.   Antony Blackbird, MSW, LCSWA Clinical Social Worker       Expected Discharge Plan: Skilled Nursing Facility Barriers to Discharge: Continued Medical Work up, English as a second language teacher, SNF Pending bed offer  Expected Discharge Plan and Services Expected Discharge Plan: Skilled Nursing Facility In-house Referral: Clinical Social Work                                             Social Determinants of Health (SDOH) Interventions    Readmission Risk Interventions No flowsheet data found.

## 2020-05-05 NOTE — Progress Notes (Signed)
No new issues or problems.  Pain reasonably well controlled.  Still with proximal left lower extremity weakness which is likely secondary to psoas muscle irritation.  Continue efforts at rehab and mobilization.  Awaiting placement.

## 2020-05-05 NOTE — Progress Notes (Signed)
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 PMH includes: THA 2017, sleep apnea.     PT Comments    Making noticeable improvements that have been slow to improve thus far.  Emphasis on sit to stands/technique, gait stability/stamina/quality, general education.  Continues to await word from insurance.    Follow Up Recommendations  SNF;Supervision/Assistance - 24 hour     Equipment Recommendations  3in1 (PT);Other (comment) (TBD)    Recommendations for Other Services       Precautions / Restrictions Precautions Precautions: Back;Fall Precaution Booklet Issued: Yes (comment) Precaution Comments: reviewed precautions in context of ADL, pt able to state 3/3 precautions Required Braces or Orthoses: Spinal Brace Spinal Brace: Thoracolumbosacral orthotic;Applied in sitting position    Mobility  Bed Mobility               General bed mobility comments: oob in the chair on arrival  Transfers Overall transfer level: Needs assistance   Transfers: Sit to/from Stand Sit to Stand: Min assist;From elevated surface Stand pivot transfers: Min assist       General transfer comment: cues for hand placement/prep for transfer, assist to come forward with some boost  Ambulation/Gait Ambulation/Gait assistance: Min guard Gait Distance (Feet): 90 Feet (x2 with 4 standing rests) Assistive device: Rolling walker (2 wheeled) Gait Pattern/deviations: Step-through pattern;Decreased stride length   Gait velocity interpretation: <1.8 ft/sec, indicate of risk for recurrent falls General Gait Details: improved advancing or each LE, good ground clearance.  Still with moderately heavy use of the  RW during gait, progressively more forward posture until cued back upright and then still 10-15* short of upright.   Stairs             Wheelchair Mobility    Modified Rankin (Stroke Patients Only)       Balance Overall balance assessment: Needs assistance Sitting-balance support: No upper extremity supported;Feet supported Sitting balance-Leahy Scale: Fair     Standing balance support: Single extremity supported;During functional activity Standing balance-Leahy Scale: Poor Standing balance comment: overall reliant on UE support and external assist.  pt able to release 1 hand from the RW for extended periods to reach out/up/                            Cognition Arousal/Alertness: Awake/alert Behavior During Therapy: WFL for tasks assessed/performed Overall Cognitive Status: Within Functional Limits for tasks assessed                                        Exercises      General Comments General comments (skin integrity, edema, etc.): Education continues      Pertinent Vitals/Pain Pain Assessment: 0-10 Pain Score: 5  Pain Location: back and side incisions/L knee Pain Descriptors / Indicators: Discomfort;Grimacing Pain Intervention(s): Monitored during session    Home Living                      Prior Function            PT Goals (current goals can now be  found in the care plan section) Acute Rehab PT Goals Patient Stated Goal: reduce pain, hopeful for home before my anniversary PT Goal Formulation: With patient Time For Goal Achievement: 05/03/20 Potential to Achieve Goals: Good Progress towards PT goals: Progressing toward goals    Frequency    Min 5X/week      PT Plan Current plan remains appropriate    Co-evaluation              AM-PAC PT "6 Clicks" Mobility   Outcome Measure  Help needed turning from your back to your side while in a flat bed without using bedrails?: A Lot Help needed moving  from lying on your back to sitting on the side of a flat bed without using bedrails?: A Lot Help needed moving to and from a bed to a chair (including a wheelchair)?: A Little Help needed standing up from a chair using your arms (e.g., wheelchair or bedside chair)?: A Little Help needed to walk in hospital room?: A Little Help needed climbing 3-5 steps with a railing? : A Lot 6 Click Score: 15    End of Session Equipment Utilized During Treatment: Back brace Activity Tolerance: Patient tolerated treatment well;Patient limited by fatigue;Patient limited by pain Patient left: in chair;with call bell/phone within reach Nurse Communication: Mobility status PT Visit Diagnosis: Muscle weakness (generalized) (M62.81);Other abnormalities of gait and mobility (R26.89);Pain Pain - part of body:  (knee, incisions)     Time: 1700-1749 PT Time Calculation (min) (ACUTE ONLY): 35 min  Charges:  $Gait Training: 8-22 mins $Therapeutic Activity: 8-22 mins                     05/05/2020  Marco Kennedy., PT Acute Rehabilitation Services [phone]  (pager) [phone]  (office)   Marco Kennedy Marco Kennedy 05/05/2020, 3:29 PM

## 2020-05-05 NOTE — Progress Notes (Signed)
Pt does not want to go on bipap now. RN will put pt on bipap when he is ready tonight.

## 2020-05-06 NOTE — Progress Notes (Signed)
PT Cancellation Note  Patient Details Name: Marco Kennedy MRN: [id number] DOB: [birth year]   Cancelled Treatment:    Reason Eval/Treat Not Completed: Patient declined, no reason specified.  Arrived so late, the pt just in bed from all day sitting. 05/06/2020  Jacinto Halim., PT Acute Rehabilitation Services [phone]  (pager) [phone]  (office)   Eliseo Gum Fayola Meckes 05/06/2020, 5:52 PM

## 2020-05-06 NOTE — Progress Notes (Signed)
Back incision has copious drainage saturating dressing and bed sheets. Honeycomb dressing changed.

## 2020-05-06 NOTE — TOC Transition Note (Signed)
Transition of Care [facility]) - CM/SW Discharge Note   Patient Details  Name: [name] MRN: [id number] Date of Birth: [birth year]  [facility]) CM/SW Contact:  Eduard Roux, LCSWA Phone Number: 05/06/2020, 8:35 AM   Clinical Narrative:     CSW spoke with [place] Abts with [employer]- she states clinicals are under reconsideration- clinicals as been expedited and they will have decision by tomorrow.   CSW updated patient's wife and left voice message with [facility]  Antony Blackbird, MSW, LCSWA Clinical Social Worker   Final next level of care: Skilled Nursing Facility Barriers to Discharge: Continued Medical Work up, English as a second language teacher, SNF Pending bed offer   Patient Goals and CMS Choice        Discharge Nutritional therapist and Services In-house Referral: Clinical Social Work                                   Social Determinants of Health (SDOH) Interventions     Readmission Risk Interventions No flowsheet data found.

## 2020-05-06 NOTE — Progress Notes (Addendum)
Subjective: Patient reports that he is continuing to make progress. No acute events were reported. Some dark drainage from posterior dressing.    Objective: Vital signs in last 24 hours: Temp:  [98 F (36.7 C)-100 F (37.8 C)] 98.4 F (36.9 C) (11/09 1200) Pulse Rate:  [93-110] 93 (11/09 0818) Resp:  [20] 20 (11/09 1200) BP: (104-128)/(61-84) 104/84 (11/09 1200) SpO2:  [92 %-100 %] 100 % (11/09 1200)  Intake/Output from previous day: 11/08 0701 - 11/09 0700 In: 920 [P.O.:920] Out: 2201 [Urine:2200; Stool:1] Intake/Output this shift: Total I/O In: 480 [P.O.:480] Out: 627 [Urine:625; Stool:2]  Physical Exam: Patient is sitting comfortably in the chair with his brace on. NAD, A/O X4 and isconversant.He MAEW with good strength with the exception of somecontinuedchronic dorsiflexion weakness. RN reporting some drainage from his thoracolumbar incisions and posterior incision overnight. Posterior dressing changed last night. Left quadriceps pain improved.  Lab Results: No results for input(s): WBC, HGB, HCT, PLT in the last 72 hours. BMET No results for input(s): NA, K, CL, CO2, GLUCOSE, BUN, CREATININE, CALCIUM in the last 72 hours.  Studies/Results: No results found.  Assessment/Plan: Patient is continuing to make progress. He reports that he is ambulating better and has had an improvement in his left quadriceps pain. Continue bowel regimen.Continue back brace when OOB, working with therapies, and encouraging mobility and ambulation. Continue monitoring incisions. Plan for dischargeto SNF once insurance approval has been received.    LOS: 18 days     Council Mechanic, DNP, NP-C 05/06/2020, 3:56 PM   Patient is making slow and steady progress. His insurance company has denied Rehab, although it would clearly be in his best interests to be admitted to Rehab.  They are also slow-walking approval for SNF and now have the audacity to deny his hospital stay altogether,  although, since they are disallowing appropriate further care, he must stay in the hospital until it is deemed safe for him to be discharged home.

## 2020-05-06 NOTE — Progress Notes (Addendum)
Pt with moderate amount of what appears to be serous drainage coming from the lower part of honeycomb on back. Per nightshift RN, Janeece Agee, honeycomb was just changed by MD on 11/8 around midnight. Benita Gutter, NP consulted to look at site during rounding and is unconcerned at this time. Will continue to monitor.   Robina Ade, RN

## 2020-05-06 NOTE — Progress Notes (Signed)
Pt noted to be be shivering. Temperature WNL, and warm blanket given. Will monitor closely.   1600: Per pt, he recently received his pneumonia and flu shots and wonders if his shivering might have been a reaction to that. Shivering resolved at this time.  Robina Ade, RN

## 2020-05-06 NOTE — Progress Notes (Signed)
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 PMH includes: THA 2017, sleep apnea.    OT comments  Pt making good steady progress with adls and self care.  Pt introduced to adaptive equipment today including elastic shoe laces, long shoe horn(has at home) and reacher (has at home). Wife to look for adaptive sock aid.  Pt worked on dressing LEs with increased success with equipment.  Pt transferred sit to stand x3 with min assist at times to min guard to pull pants up and clean bottom after bowel movement.  Pt continues to be limited by pain but is making progress with all adls.   Follow Up Recommendations  SNF;Supervision/Assistance - 24 hour    Equipment Recommendations       Recommendations for Other Services      Precautions / Restrictions Precautions Precautions: Back;Fall Precaution Comments: reviewed precautions in context of ADL, pt able to state 3/3 precautions Required Braces or Orthoses: Spinal Brace Spinal Brace: Thoracolumbosacral orthotic;Other (comment) (knee brace for L) Spinal Brace Comments: donned in sitting and readjusted.       Mobility Bed Mobility               General bed mobility comments: oob in the chair on arrival  Transfers Overall transfer level: Needs assistance Equipment used: Rolling walker (2 wheeled) Transfers: Sit to/from Stand Sit to Stand: Min assist;From elevated surface         General transfer comment: Pt did very well with hand placement. Pt struggles getting to feet from recliner because it is low, but can without physical assist.    Balance Overall balance assessment: Needs assistance Sitting-balance support: Feet  supported Sitting balance-Leahy Scale: Fair Sitting balance - Comments: worked on donning brace with little assist and no support.   Standing balance support: Bilateral upper extremity supported;During functional activity Standing balance-Leahy Scale: Poor Standing balance comment: Pt heavily reliant on outside support of walker to stand.                           ADL either performed or assessed with clinical judgement   ADL Overall ADL's : Needs assistance/impaired Eating/Feeding: Modified independent;Sitting   Grooming: Min guard;Standing Grooming Details (indicate cue type and reason): stood at sink with min guard to brush teeth and wash hands.         Upper Body Dressing : Sitting;Min guard Upper Body Dressing Details (indicate cue type and reason): min guard and vcs for TLSO Lower Body Dressing: Maximal assistance;Sit to/from stand;Cueing for compensatory techniques Lower Body Dressing Details (indicate cue type and reason): Pt donned shorts with mod assist.  Shorts are loose so was able to start them over feet without reacher.  Instructed to dress L leg first due to knee issues.  pt has reacher and home and feel he will need this to donn underwear or smaller shorts. Toilet Transfer: Minimal assistance;RW;Ambulation;Cueing for Chief of Staff Details (indicate cue type and reason): Pt walked to bathroom with min assist and used comfort commode with 3:1 and rails. Toileting- Clothing Manipulation and Hygiene: Moderate assistance Toileting - Clothing Manipulation Details (indicate cue type and reason): wife has ordered the bathroom  tongs to assist in cleaning self after bowel movements.     Functional mobility during ADLs: Minimal assistance;Rolling walker;Cueing for safety;Cueing for sequencing General ADL Comments: Educated initiated with AE.  Pt has reacher and long shoe horn at home.  Elastic laces provided for shoes and pt's wife to look for a sock aid.       Vision   Vision Assessment?: No apparent visual deficits   Perception     Praxis      Cognition Arousal/Alertness: Awake/alert Behavior During Therapy: WFL for tasks assessed/performed Overall Cognitive Status: Within Functional Limits for tasks assessed                                          Exercises     Shoulder Instructions       General Comments Pt with mild drainage noted at posterior end of honeycomb dressing.    Pertinent Vitals/ Pain       Pain Assessment: Faces Faces Pain Scale: Hurts even more Pain Location: back Pain Descriptors / Indicators: Discomfort;Grimacing Pain Intervention(s): Monitored during session;Repositioned;Premedicated before session  Home Living                                          Prior Functioning/Environment              Frequency  Min 2X/week        Progress Toward Goals  OT Goals(current goals can now be found in the care plan section)  Progress towards OT goals: Progressing toward goals  Acute Rehab OT Goals Patient Stated Goal: reduce pain, hopeful for home before my anniversary OT Goal Formulation: With patient Time For Goal Achievement: 05/16/20 Potential to Achieve Goals: Good ADL Goals Pt Will Perform Grooming: with modified independence Pt Will Perform Lower Body Bathing: with min guard assist;sitting/lateral leans;sit to/from stand;with adaptive equipment Pt Will Perform Upper Body Dressing: with modified independence;sitting Pt Will Perform Lower Body Dressing: with supervision;with adaptive equipment;sit to/from stand;sitting/lateral leans Pt Will Transfer to Toilet: with modified independence;ambulating;bedside commode Pt Will Perform Toileting - Clothing Manipulation and hygiene: with modified independence;with adaptive equipment;sitting/lateral leans;sit to/from stand Additional ADL Goal #1: Pt will perform bed mobility at supervision level as precursor to  EOB/OOB ADL.  Plan Discharge plan remains appropriate    Co-evaluation                 AM-PAC OT "6 Clicks" Daily Activity     Outcome Measure   Help from another person eating meals?: None Help from another person taking care of personal grooming?: None Help from another person toileting, which includes using toliet, bedpan, or urinal?: A Lot Help from another person bathing (including washing, rinsing, drying)?: A Lot Help from another person to put on and taking off regular upper body clothing?: A Lot Help from another person to put on and taking off regular lower body clothing?: A Lot 6 Click Score: 16    End of Session Equipment Utilized During Treatment: Rolling walker;Back brace  OT Visit Diagnosis: Other abnormalities of gait and mobility (R26.89);Pain;Unsteadiness on feet (R26.81) Pain - Right/Left: Left Pain - part of body: Knee   Activity Tolerance Patient tolerated treatment well   Patient Left in chair;with call bell/phone within reach;with family/visitor present   Nurse Communication  Mobility status        Time: 1100-1139 OT Time Calculation (min): 39 min  Charges: OT General Charges $OT Visit: 1 Visit OT Treatments $Self Care/Home Management : 38-52 mins   Hope Budds 05/06/2020, 11:50 AM

## 2020-05-07 LAB — SARS CORONAVIRUS 2 BY RT PCR (HOSPITAL ORDER, PERFORMED IN ~~LOC~~ HOSPITAL LAB): SARS Coronavirus 2: NEGATIVE

## 2020-05-07 MED ORDER — HYDROCODONE-ACETAMINOPHEN 5-325 MG PO TABS
1.0000 | ORAL_TABLET | ORAL | 0 refills | Status: DC | PRN
Start: 2020-05-07 — End: 2021-01-12

## 2020-05-07 MED ORDER — METHOCARBAMOL 500 MG PO TABS: 500.0000 mg | ORAL_TABLET | Freq: Four times a day (QID) | ORAL | 1 refills | Status: DC | PRN

## 2020-05-07 NOTE — TOC Transition Note (Signed)
Transition of Care [facility]) - CM/SW Discharge Note   Patient Details  Name: Marco Kennedy MRN: [id number] Date of Birth: [birth year]  [facility] CM/SW Contact:  Eduard Roux, LCSWA Phone Number: 05/07/2020, 2:45 PM   Clinical Narrative:     [facility] to[facility]  DC Date: 05/07/2020 Family Notified: Kathy,wife  Transport By:   Per MD patient is ready for discharge. RN, patient, and facility notified of DC. Discharge Summary sent to facility. RN given number for report([phone], Ext [phone]. Ambulance transport requested for patient.   Clinical Social Worker signing off.  Antony Blackbird, MSW, LCSWA Clinical Social Worker    Final next level of care: Skilled Nursing Facility Barriers to Discharge: Barriers Resolved   Patient Goals and CMS Choice        Discharge Placement PASRR number recieved: 04/28/20            Patient chooses bed at:  [facility]) Patient to be transferred to facility by: PTAR Name of family member notified: Kathy,spouse Patient and family notified of of transfer: 05/07/20  Discharge Plan and Services In-house Referral: Clinical Social Work                                   Social Determinants of Health (SDOH) Interventions     Readmission Risk Interventions No flowsheet data found.

## 2020-05-07 NOTE — Progress Notes (Signed)
Subjective: Patient reports that he had a restful night. No acute events reported overnight.   Objective: Vital signs in last 24 hours: Temp:  [98.4 F (36.9 C)-99.6 F (37.6 C)] 98.7 F (37.1 C) (11/10 0804) Pulse Rate:  [85-106] 85 (11/10 0804) Resp:  [16-20] 16 (11/10 0804) BP: (104-137)/(62-84) 131/74 (11/10 0804) SpO2:  [96 %-100 %] 96 % (11/10 0804)  Intake/Output from previous day: 11/09 0701 - 11/10 0700 In: 720 [P.O.:720] Out: 1578 [Urine:1575; Stool:3] Intake/Output this shift: No intake/output data recorded.  Physical Exam: Patient is in NAD, A/O X4 and isconversant.He MAEW with good strength with the exception of somecontinuedchronic dorsiflexion weakness. Continued drainage from histhoracolumbar incisionsand posterior incision overnight.  Lab Results: No results for input(s): WBC, HGB, HCT, PLT in the last 72 hours. BMET No results for input(s): NA, K, CL, CO2, GLUCOSE, BUN, CREATININE, CALCIUM in the last 72 hours.  Studies/Results: No results found.  Assessment/Plan: Patient is doing well. He continues to wait for insurance approval for SNF placement. Continue working with therapies and encouraging ambulation and mobility.     LOS: 19 days     Council Mechanic 05/07/2020, 8:25 AM

## 2020-05-07 NOTE — Progress Notes (Signed)
Pt to be discharged today. Discharge orders and summary received to send to receiving facility, as well as scripts for Norco and Robaxin. Benita Gutter, NP, says he can either sign the scripts or if they are taken to the OR, Dr. Venetia Maxon can sign them sooner. Scripts taken to the OR by Juliette Alcide, Charity fundraiser and signed by Dr. Venetia Maxon. Pt PIV removed, room belongings packed, and AVS placed in packet for receiving facility. Report called to [employer]. Awaiting PTAR.   Robina Ade, RN

## 2020-05-07 NOTE — Progress Notes (Signed)
Physical Therapy Treatment Patient Details Name: MARIS BENA MRN: [id number] DOB: 23-Jan-1951 Today's Date: 05/07/2020    History of Present Illness The pt is a 69 yo male presenting s/p anterior and lateral lumbar fusion L4-5 and L5-S1 10/22 (second stage planned for monday 10/25) due to ongoing chronic LBP with sx radiating into his LLE. PMH includes: THA 2017, sleep apnea.     PT Comments    Emphasis on education with pt/wife and continued stress on sit to stand technique with progression of gait stability/stamina/quality.    Follow Up Recommendations  SNF;Supervision/Assistance - 24 hour     Equipment Recommendations  3in1 (PT);Other (comment) (TBD)    Recommendations for Other Services       Precautions / Restrictions Precautions Precautions: Back;Fall Precaution Booklet Issued: Yes (comment) Precaution Comments: reviewed precautions in context of ADL, pt able to state 3/3 precautions Required Braces or Orthoses: Spinal Brace Spinal Brace: Thoracolumbosacral orthotic;Applied in sitting position    Mobility  Bed Mobility               General bed mobility comments: oob in the chair on arrival  Transfers Overall transfer level: Needs assistance   Transfers: Sit to/from Stand Sit to Stand: Min assist;From elevated surface         General transfer comment: cues for hand placement/prep for transfer, assist to come forward with some boost  Ambulation/Gait Ambulation/Gait assistance: Min guard Gait Distance (Feet): 150 Feet Assistive device: Rolling walker (2 wheeled) Gait Pattern/deviations: Step-through pattern;Decreased stride length   Gait velocity interpretation: <1.8 ft/sec, indicate of risk for recurrent falls General Gait Details: improved advancing or each LE, good ground clearance.  Still with moderately heavy use of the RW during gait, progressively more forward posture until cued back upright   Pt generally with more forward lean  today>15*   Stairs             Wheelchair Mobility    Modified Rankin (Stroke Patients Only)       Balance Overall balance assessment: Needs assistance Sitting-balance support: No upper extremity supported;Feet supported Sitting balance-Leahy Scale: Fair Sitting balance - Comments: able to fully donn his brace today without cue or assist, sitting EOB   Standing balance support: Single extremity supported;During functional activity Standing balance-Leahy Scale: Poor (but much improved) Standing balance comment: overall reliant on UE support and external assist.  pt able to release 1 hand from the RW for extended periods to reach out/up/                            Cognition Arousal/Alertness: Awake/alert Behavior During Therapy: [facility] for tasks assessed/performed Overall Cognitive Status: Within Functional Limits for tasks assessed                                        Exercises      General Comments General comments (skin integrity, edema, etc.): pt/wife and therapist discussed some general modifications to chairs and bed that may work for them.  Gave them a list of things yet to be improved on to safely get home.  Expectations for progression at this point      Pertinent Vitals/Pain Pain Assessment: Faces Faces Pain Scale: Hurts little more Pain Location: back and side incisions/L knee Pain Descriptors / Indicators: Discomfort;Grimacing    Home Living  Prior Function            PT Goals (current goals can now be found in the care plan section) Acute Rehab PT Goals Patient Stated Goal: reduce pain, hopeful for home before my anniversary PT Goal Formulation: With patient Time For Goal Achievement: 05/10/20 Potential to Achieve Goals: Good Progress towards PT goals: Progressing toward goals    Frequency    Min 5X/week      PT Plan Current plan remains appropriate    Co-evaluation               AM-PAC PT "6 Clicks" Mobility   Outcome Measure  Help needed turning from your back to your side while in a flat bed without using bedrails?: A Lot Help needed moving from lying on your back to sitting on the side of a flat bed without using bedrails?: A Lot Help needed moving to and from a bed to a chair (including a wheelchair)?: A Little Help needed standing up from a chair using your arms (e.g., wheelchair or bedside chair)?: A Little Help needed to walk in hospital room?: A Little Help needed climbing 3-5 steps with a railing? : A Lot 6 Click Score: 15    End of Session Equipment Utilized During Treatment: Back brace Activity Tolerance: Patient tolerated treatment well;Patient limited by fatigue;Patient limited by pain Patient left: in chair;with call bell/phone within reach Nurse Communication: Mobility status PT Visit Diagnosis: Muscle weakness (generalized) (M62.81);Other abnormalities of gait and mobility (R26.89);Pain Pain - Right/Left: Left Pain - part of body: Leg;Knee (knee, incisions)     Time: 4431-5400 PT Time Calculation (min) (ACUTE ONLY): 30 min  Charges:  $Gait Training: 8-22 mins $Therapeutic Activity: 8-22 mins                     05/07/2020  Jacinto Halim., PT Acute Rehabilitation Services [phone]  (pager) [phone]  (office)   Eliseo Gum Nazeer Romney 05/07/2020, 4:48 PM

## 2021-01-10 ENCOUNTER — Encounter (HOSPITAL_COMMUNITY): Payer: Self-pay | Admitting: Emergency Medicine

## 2021-01-10 ENCOUNTER — Other Ambulatory Visit: Payer: Self-pay

## 2021-01-10 ENCOUNTER — Emergency Department (HOSPITAL_COMMUNITY): Payer: BC Managed Care – PPO

## 2021-01-10 ENCOUNTER — Inpatient Hospital Stay (HOSPITAL_COMMUNITY)
Admission: EM | Admit: 2021-01-10 | Discharge: 2021-01-16 | DRG: 559 | Disposition: A | Discharge status: Home Health | Payer: BC Managed Care – PPO | Source: Other Acute Inpatient Hospital | Attending: Internal Medicine | Admitting: Internal Medicine

## 2021-01-10 DIAGNOSIS — G061 Intraspinal abscess and granuloma: Secondary | ICD-10-CM | POA: Diagnosis present

## 2021-01-10 DIAGNOSIS — B9620 Unspecified Escherichia coli [E. coli] as the cause of diseases classified elsewhere: Secondary | ICD-10-CM | POA: Diagnosis present

## 2021-01-10 DIAGNOSIS — T847XXA Infection and inflammatory reaction due to other internal orthopedic prosthetic devices, implants and grafts, initial encounter: Secondary | ICD-10-CM

## 2021-01-10 DIAGNOSIS — Z6837 Body mass index (BMI) 37.0-37.9, adult: Secondary | ICD-10-CM

## 2021-01-10 DIAGNOSIS — Z20822 Contact with and (suspected) exposure to covid-19: Secondary | ICD-10-CM | POA: Diagnosis present

## 2021-01-10 DIAGNOSIS — A419 Sepsis, unspecified organism: Principal | ICD-10-CM | POA: Diagnosis present

## 2021-01-10 DIAGNOSIS — E669 Obesity, unspecified: Secondary | ICD-10-CM | POA: Diagnosis present

## 2021-01-10 DIAGNOSIS — K651 Peritoneal abscess: Secondary | ICD-10-CM

## 2021-01-10 DIAGNOSIS — Z8261 Family history of arthritis: Secondary | ICD-10-CM

## 2021-01-10 DIAGNOSIS — G4733 Obstructive sleep apnea (adult) (pediatric): Secondary | ICD-10-CM | POA: Diagnosis present

## 2021-01-10 DIAGNOSIS — M4624 Osteomyelitis of vertebra, thoracic region: Secondary | ICD-10-CM | POA: Diagnosis present

## 2021-01-10 DIAGNOSIS — E876 Hypokalemia: Secondary | ICD-10-CM | POA: Diagnosis present

## 2021-01-10 DIAGNOSIS — Y831 Surgical operation with implant of artificial internal device as the cause of abnormal reaction of the patient, or of later complication, without mention of misadventure at the time of the procedure: Secondary | ICD-10-CM | POA: Diagnosis present

## 2021-01-10 DIAGNOSIS — M4620 Osteomyelitis of vertebra, site unspecified: Secondary | ICD-10-CM

## 2021-01-10 DIAGNOSIS — T8463XA Infection and inflammatory reaction due to internal fixation device of spine, initial encounter: Secondary | ICD-10-CM | POA: Diagnosis not present

## 2021-01-10 DIAGNOSIS — K5900 Constipation, unspecified: Secondary | ICD-10-CM | POA: Diagnosis present

## 2021-01-10 DIAGNOSIS — Z96642 Presence of left artificial hip joint: Secondary | ICD-10-CM | POA: Diagnosis present

## 2021-01-10 DIAGNOSIS — Z79899 Other long term (current) drug therapy: Secondary | ICD-10-CM

## 2021-01-10 DIAGNOSIS — K824 Cholesterolosis of gallbladder: Secondary | ICD-10-CM | POA: Diagnosis present

## 2021-01-10 DIAGNOSIS — M1990 Unspecified osteoarthritis, unspecified site: Secondary | ICD-10-CM | POA: Diagnosis present

## 2021-01-10 DIAGNOSIS — L02212 Cutaneous abscess of back [any part, except buttock]: Secondary | ICD-10-CM | POA: Diagnosis present

## 2021-01-10 DIAGNOSIS — L0291 Cutaneous abscess, unspecified: Secondary | ICD-10-CM | POA: Diagnosis not present

## 2021-01-10 DIAGNOSIS — Z981 Arthrodesis status: Secondary | ICD-10-CM

## 2021-01-10 DIAGNOSIS — R7401 Elevation of levels of liver transaminase levels: Secondary | ICD-10-CM | POA: Diagnosis present

## 2021-01-10 DIAGNOSIS — R739 Hyperglycemia, unspecified: Secondary | ICD-10-CM | POA: Diagnosis present

## 2021-01-10 LAB — CBC WITH DIFFERENTIAL/PLATELET
Abs Immature Granulocytes: 0.6 10*3/uL — ABNORMAL HIGH (ref 0.00–0.07)
Basophils Absolute: 0 10*3/uL (ref 0.0–0.1)
Basophils Relative: 0 %
Eosinophils Absolute: 0 10*3/uL (ref 0.0–0.5)
Eosinophils Relative: 0 %
HCT: 41.2 % (ref 39.0–52.0)
Hemoglobin: 13.1 g/dL (ref 13.0–17.0)
Immature Granulocytes: 4 %
Lymphocytes Relative: 2 %
Lymphs Abs: 0.4 10*3/uL — ABNORMAL LOW (ref 0.7–4.0)
MCH: 27.7 pg (ref 26.0–34.0)
MCHC: 31.8 g/dL (ref 30.0–36.0)
MCV: 87.1 fL (ref 80.0–100.0)
Monocytes Absolute: 1.3 10*3/uL — ABNORMAL HIGH (ref 0.1–1.0)
Monocytes Relative: 9 %
Neutro Abs: 12.7 10*3/uL — ABNORMAL HIGH (ref 1.7–7.7)
Neutrophils Relative %: 85 %
Platelets: 173 10*3/uL (ref 150–400)
RBC: 4.73 MIL/uL (ref 4.22–5.81)
RDW: 15.1 % (ref 11.5–15.5)
WBC: 15.1 10*3/uL — ABNORMAL HIGH (ref 4.0–10.5)
nRBC: 0 % (ref 0.0–0.2)

## 2021-01-10 LAB — COMPREHENSIVE METABOLIC PANEL
ALT: 112 U/L — ABNORMAL HIGH (ref 0–44)
AST: 163 U/L — ABNORMAL HIGH (ref 15–41)
Albumin: 2.4 g/dL — ABNORMAL LOW (ref 3.5–5.0)
Alkaline Phosphatase: 108 U/L (ref 38–126)
Anion gap: 9 (ref 5–15)
BUN: 24 mg/dL — ABNORMAL HIGH (ref 8–23)
CO2: 27 mmol/L (ref 22–32)
Calcium: 8 mg/dL — ABNORMAL LOW (ref 8.9–10.3)
Chloride: 100 mmol/L (ref 98–111)
Creatinine, Ser: 0.99 mg/dL (ref 0.61–1.24)
GFR, Estimated: >60 mL/min (ref >60)
Glucose, Bld: 167 mg/dL — ABNORMAL HIGH (ref 70–99)
Potassium: 3.4 mmol/L — ABNORMAL LOW (ref 3.5–5.1)
Sodium: 136 mmol/L (ref 135–145)
Total Bilirubin: 1.5 mg/dL — ABNORMAL HIGH (ref 0.3–1.2)
Total Protein: 5.9 g/dL — ABNORMAL LOW (ref 6.5–8.1)

## 2021-01-10 LAB — LIPASE, BLOOD: Lipase: 28 U/L (ref 11–51)

## 2021-01-10 LAB — LACTIC ACID, PLASMA: Lactic Acid, Venous: 1.4 mmol/L (ref 0.5–1.9)

## 2021-01-10 IMAGING — DX DG CHEST 1V PORT
1 series · 1 of 1 positions shown · non-contrast
Comparison: [DATE]

CLINICAL DATA: Fever, lower back abscess

EXAM:
PORTABLE CHEST 1 VIEW

[chest ap]
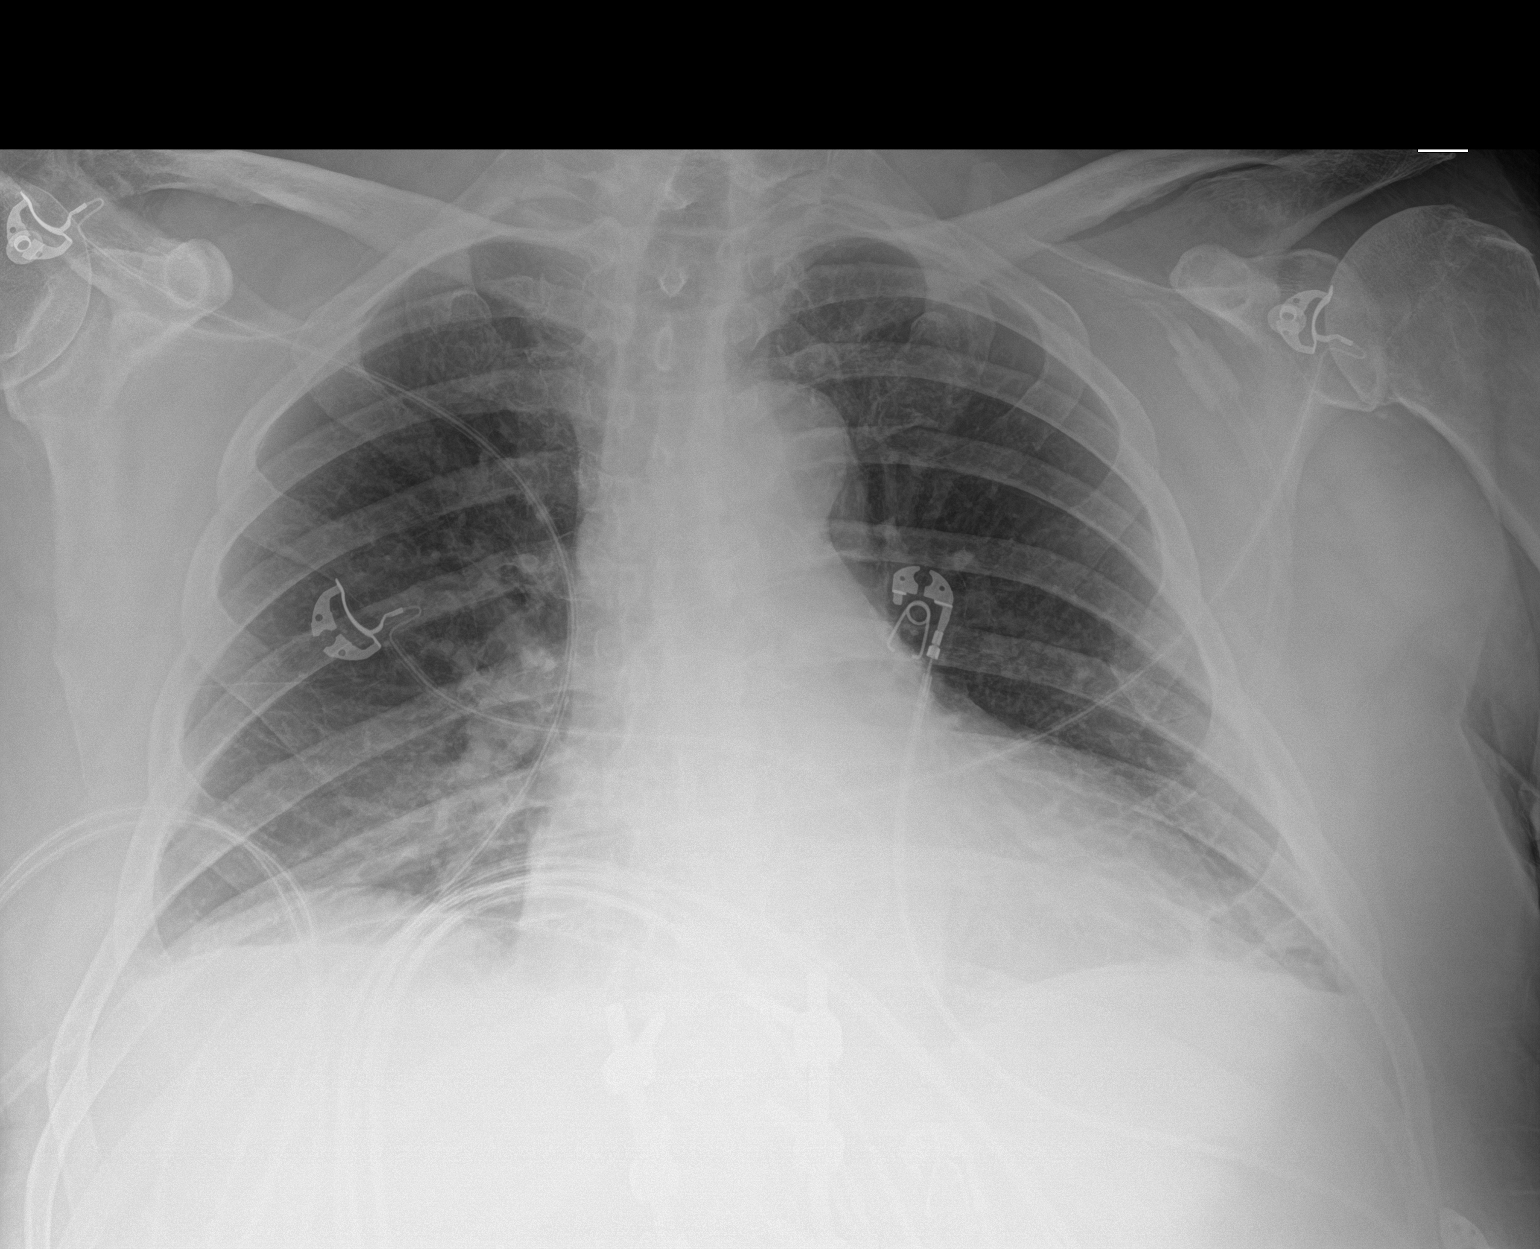

[1 of 1 positions shown; findings below may reference images not displayed]

FINDINGS: Single frontal view of the chest demonstrates an unremarkable
cardiac silhouette. No airspace disease, effusion, or pneumothorax.
Postsurgical changes are seen at the thoracolumbar junction. No
acute displaced fractures.
IMPRESSION: 1. No acute intrathoracic process.

## 2021-01-10 MED ORDER — HYDROMORPHONE HCL 1 MG/ML IJ SOLN
1.0000 mg | Freq: Once | INTRAMUSCULAR | Status: AC
  Administered 2021-01-10: 1 mg via INTRAVENOUS
  Filled 2021-01-10: qty 1

## 2021-01-10 MED ORDER — LACTATED RINGERS IV BOLUS
1000.0000 mL | Freq: Once | INTRAVENOUS | Status: AC
  Administered 2021-01-10: 1000 mL via INTRAVENOUS

## 2021-01-10 MED ORDER — SODIUM CHLORIDE 0.9 % IV BOLUS
1000.0000 mL | Freq: Once | INTRAVENOUS | Status: AC
  Administered 2021-01-10: 1000 mL via INTRAVENOUS

## 2021-01-10 NOTE — ED Triage Notes (Signed)
Pt transferred from [facility] for c/o an abscess on his lower back, pt had prior back surgery here at [facility]. EMS reported fever of 101 prior to leave and got 650 mg Tylenol prior to leave.

## 2021-01-10 NOTE — ED Provider Notes (Signed)
[facility] EMERGENCY DEPARTMENT Provider Note   CSN: [id number] Arrival date & time: 01/10/21  2201     History Chief Complaint  Patient presents with   Abscess    [name] is a 70 y.o. male with a history of lumbar radiculopathy s/p ALIF L4-L5 and L5S1 with left-sided XLIF at L2-3 and L3-4 with stage II of T10 to pelvis pedicle screw fixation with osteotomies and posterior lateral arthrodesis with autograft and allograft who presents to the emergency department as a transfer from [facility] ER with a chief complaint of pelvic abscess.  The patient reports that 6 days ago that he developed constant, worsening left sided back and flank pain.  He characterizes the pain as aching.  Pain is at a 2-3 when he is not moving, but is an 8 with movement.  No other known aggravating or alleviating factors.  Then, 5 days ago he developed fever, chills, and vomiting, but vomiting resolved after 1 day.  He did have an episode 4 days ago where he "slid" out of bed and laid on the floor for 2 to 3 hours because he was unable to get up until he was assisted by first responders, and reports that after that episode that he seemed to be improving for the next day. As the pain continued to worsen he presented to the ER in [place] this morning.  EDP and creatinine spoke with Marco Kennedy, APP covering the neurosurgery service, who accepted the patient as a transfer after he was found to have a large 9.6 x 7.7 x 10.8 cm loculated fluid collection concerning for seroma versus abscess in the left flank and pelvis.  He was febrile in the emergency department and was given Tylenol just prior to transfer.  He was given Zosyn and vancomycin in the ED prior to transfer.  The history is provided by the patient and medical records. No language interpreter was used.      Past Medical History:  Diagnosis Date   Arthritis    arthritis-bilateral knees, left Hip.   Degenerative lumbar spinal stenosis     EKG abnormality    06-08-16- being evaluated by cardiologist  in [place]   Idiopathic scoliosis of lumbar region    Joint stiffness    Knee pain    Leg swelling    Low back pain    Achy, shooting pain.  Hurts to Walk or stand up straight.   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    Past Surgical History:  Procedure Laterality Date   ABDOMINAL EXPOSURE N/A 04/18/2020   Procedure: ABDOMINAL EXPOSURE;  Surgeon: [name], MD;  Location: [facility] OR;  Service: Vascular;  Laterality: N/A;   ANTERIOR LAT LUMBAR FUSION Left 04/18/2020   Procedure: ANTERIOR LATERAL LUMBAR FUSION LUMBAR TWO-THREE, LUMBAR THREE-FOUR;  Surgeon: [name], MD;  Location: [facility] OR;  Service: Neurosurgery;  Laterality: Left;   ANTERIOR LUMBAR FUSION N/A 04/18/2020   Procedure: Lumbar Four-Five Lumbar Five- Sacral One Anterior lumbar Interbody Fusion;  Surgeon: [name], MD;  Location: [facility] OR;  Service: Neurosurgery;  Laterality: N/A;   APPLICATION OF INTRAOPERATIVE CT SCAN N/A 04/21/2020   Procedure: APPLICATION OF INTRAOPERATIVE CT SCAN;  Surgeon: [name], MD;  Location: [facility] OR;  Service: Neurosurgery;  Laterality: N/A;   BACK SURGERY     lumbar disc surgery   COLONOSCOPY W/ POLYPECTOMY     HERNIA REPAIR Left    left inguinal hernia repair   KNEE ARTHROSCOPY Right 2003    -scope for meniscus   POSTERIOR LUMBAR FUSION 4 LEVEL N/A 04/21/2020   Procedure: Thoracic ten to pelvis pedicle screw fixation with osteotomies;  Surgeon: [name], MD;  Location: [facility] OR;  Service: Neurosurgery;  Laterality: N/A;   TOTAL HIP ARTHROPLASTY  Left 06/15/2016   Procedure: LEFT TOTAL HIP ARTHROPLASTY ANTERIOR APPROACH;  Surgeon: [name], MD;  Location: [facility] ORS;  Service: Orthopedics;  Laterality: Left;       Family History  Problem Relation Age of Onset   Hypertension Mother    Arthritis Mother    Stomach cancer Father    Hypertension Brother     Social History   Tobacco Use   Smoking status: Never   Smokeless tobacco: Never  Vaping Use   Vaping Use: Never used  Substance Use Topics   Alcohol use: Yes    Comment: rare- social    Drug use: No    Home Medications Prior to Admission medications   Medication Sig Start Date End Date Taking? Authorizing Provider  acetaminophen (TYLENOL) 500 MG tablet Take 1,000 mg by mouth every 6 (six) hours as needed for moderate pain.     [provider]  Ascorbic Acid (VITAMIN C) 100 MG tablet Take 100 mg by mouth daily.    [provider]  cholecalciferol (VITAMIN D3) 25 MCG (1000 UNIT) tablet Take 1,000 Units by mouth daily.    [provider]  docusate sodium (COLACE) 100 MG capsule Take 1 capsule (100 mg total) by mouth 2 (two) times daily. Patient not taking: Reported on 04/03/2020 06/16/16   [name], PA-C  ferrous sulfate 325 (65 FE) MG tablet Take 1 tablet (325 mg total) by mouth 3 (three) times daily after meals. Patient not taking: Reported on 04/03/2020 06/16/16   [name], PA-C  Glucosamine-Chondroitin (OSTEO BI-FLEX REGULAR STRENGTH PO) Take 2 capsules by mouth daily. Patient not taking: Reported on 12/25/2019    [provider]  HYDROcodone-acetaminophen (NORCO/VICODIN) 5-325 MG tablet Take 1-2 tablets by mouth every 4 (four) hours as needed for severe pain ((score 7 to 10)). 05/07/20   [name], MD  methocarbamol (ROBAXIN) 500 MG tablet Take 1 tablet (500 mg total) by mouth every 6 (six) hours as needed for muscle spasms. Patient not taking: Reported on 12/25/2019 06/16/16   [name], PA-C  methocarbamol  (ROBAXIN) 500 MG tablet Take 1 tablet (500 mg total) by mouth every 6 (six) hours as needed for muscle spasms. 05/07/20   [name], MD  Multiple Vitamin (MULTIVITAMIN WITH MINERALS) TABS tablet Take 1 tablet by mouth daily.    [provider]  polyethylene glycol (MIRALAX / GLYCOLAX) packet Take 17 g by mouth 2 (two) times daily. Patient not taking: Reported on 12/25/2019 06/16/16   [name], PA-C    Allergies    Patient has no known allergies.  Review of Systems   Review of Systems  Constitutional:  Positive for chills and fever. Negative for appetite change.  Respiratory:  Negative for shortness of breath and wheezing.   Cardiovascular:  Negative  for chest pain and palpitations.  Gastrointestinal:  Positive for vomiting. Negative for abdominal pain, diarrhea and nausea.  Genitourinary:  Positive for flank pain. Negative for dysuria, hematuria, penile pain, penile swelling and urgency.  Musculoskeletal:  Positive for back pain. Negative for myalgias, neck pain and neck stiffness.  Skin:  Negative for rash and wound.  Allergic/Immunologic: Negative for immunocompromised state.  Neurological:  Negative for seizures, syncope, weakness, numbness and headaches.  Psychiatric/Behavioral:  Negative for confusion.    Physical Exam Updated Vital Signs BP (!) 154/75   Pulse (!) 105   Temp 99.8 F (37.7 C) (Oral)   Resp (!) 29   Ht  (1.702 m)   Wt 108 kg   SpO2 96%   BMI 37.29 kg/m   Physical Exam Vitals and nursing note reviewed.  Constitutional:      General: He is not in acute distress.    Appearance: He is well-developed. He is not ill-appearing, toxic-appearing or diaphoretic.  HENT:     Head: Normocephalic.  Eyes:     Conjunctiva/sclera: Conjunctivae normal.  Cardiovascular:     Rate and Rhythm: Normal rate and regular rhythm.     Heart sounds: No murmur heard. Pulmonary:     Effort: Pulmonary effort is normal. No respiratory distress.     Breath  sounds: No stridor. No wheezing, rhonchi or rales.  Chest:     Chest wall: No tenderness.  Abdominal:     General: There is no distension.     Palpations: Abdomen is soft. There is no mass.     Tenderness: There is abdominal tenderness. There is no right CVA tenderness, left CVA tenderness, guarding or rebound.     Hernia: No hernia is present.     Comments: Tender to palpation in the epigastric region and right upper quadrant.  Negative Murphy sign.  No CVA tenderness bilaterally.  Musculoskeletal:     Cervical back: Neck supple.     Comments: Tender to palpation to the left mid and lower back.  No overlying erythema, edema, warmth, rashes, or wounds.  Midline tenderness to the lumbar spinous processes.  Thoracic and lumbar spine are nontender without crepitus or step-offs.  Skin:    General: Skin is warm and dry.     Coloration: Skin is not jaundiced.  Neurological:     Mental Status: He is alert.     Comments: Unable to lift the bilateral legs off the bed.  Good strength against resistance with dorsiflexion plantarflexion.  Sensation is intact and equal to the bilateral upper and lower extremities.  Good strength against resistance of the bilateral upper extremities.  Cranial nerves II through XII are grossly intact.  Psychiatric:        Behavior: Behavior normal.    ED Results / Procedures / Treatments   Labs (all labs ordered are listed, but only abnormal results are displayed) Labs Reviewed  CBC WITH DIFFERENTIAL/PLATELET - Abnormal; Notable for the following components:      Result Value   WBC 15.1 (*)    Neutro Abs 12.7 (*)    Lymphs Abs 0.4 (*)    Monocytes Absolute 1.3 (*)    Abs Immature Granulocytes 0.60 (*)    All other components within normal limits  COMPREHENSIVE METABOLIC PANEL - Abnormal; Notable for the following components:   Potassium 3.4 (*)    Glucose, Bld 167 (*)    BUN 24 (*)    Calcium 8.0 (*)    Total Protein 5.9 (*)  Albumin 2.4 (*)    AST 163  (*)    ALT 112 (*)    Total Bilirubin 1.5 (*)    All other components within normal limits  URINALYSIS, ROUTINE W REFLEX MICROSCOPIC - Abnormal; Notable for the following components:   Color, Urine AMBER (*)    APPearance HAZY (*)    Specific Gravity, Urine 1.033 (*)    Hgb urine dipstick SMALL (*)    Protein, ur 30 (*)    All other components within normal limits  C-REACTIVE PROTEIN - Abnormal; Notable for the following components:   CRP 26.0 (*)    All other components within normal limits  CULTURE, BLOOD (ROUTINE X 2)  CULTURE, BLOOD (ROUTINE X 2)  LACTIC ACID, PLASMA  LIPASE, BLOOD  SEDIMENTATION RATE  PROCALCITONIN  PROCALCITONIN    EKG EKG Interpretation  Date/Time:  Saturday January 10 2021 22:12:56 EDT Ventricular Rate:  82 PR Interval:  190 QRS Duration: 165 QT Interval:  423 QTC Calculation: 495 R Axis:   -38 Text Interpretation: Sinus rhythm Right bundle branch block LVH by voltage Since last tracing Right bundle branch block is now present Confirmed by Susy Frizzle [phone]) on 01/10/2021 10:34:23 PM  Radiology DG Chest Portable 1 View  Result Date: 01/10/2021 CLINICAL DATA:  Fever, lower back abscess EXAM: PORTABLE CHEST 1 VIEW COMPARISON:  11/13/2007 FINDINGS: Single frontal view of the chest demonstrates an unremarkable cardiac silhouette. No airspace disease, effusion, or pneumothorax. Postsurgical changes are seen at the thoracolumbar junction. No acute displaced fractures. IMPRESSION: 1. No acute intrathoracic process. Electronically Signed   By: Sharlet Salina M.D.   On: 01/10/2021 22:58   US Abdomen Limited RUQ (LIVER/GB)  Result Date: 01/11/2021 CLINICAL DATA:  Elevated transaminase EXAM: ULTRASOUND ABDOMEN LIMITED RIGHT UPPER QUADRANT COMPARISON:  None. FINDINGS: Gallbladder: 10 mm gallbladder polyp, although without vascularity. No gallstones or gallbladder wall thickening. Negative sonographic Murphy's sign. Common bile duct: Diameter: 4 mm Liver:  Hyperechoic hepatic parenchyma. No focal hepatic lesion is seen. Portal vein is patent on color Doppler imaging with normal direction of blood flow towards the liver. Other: None. IMPRESSION: Hyperechoic hepatic parenchyma, suggesting hepatic steatosis. 10 mm gallbladder polyp. Given size, outpatient surgical consultation is suggested for consideration of elective cholecystectomy. If a conservative approach is desired, this is amenable to annual follow-up ultrasound. Electronically Signed   By: Charline Bills M.D.   On: 01/11/2021 01:57    Procedures Procedures   Medications Ordered in ED Medications  sodium chloride 0.9 % bolus 1,000 mL (0 mLs Intravenous Stopped 01/10/21 2333)  lactated ringers bolus 1,000 mL (0 mLs Intravenous Stopped 01/10/21 2333)  HYDROmorphone (DILAUDID) injection 1 mg (1 mg Intravenous Given 01/10/21 2257)    ED Course  I have reviewed the triage vital signs and the nursing notes.  Pertinent labs & imaging results that were available during my care of the patient were reviewed by me and considered in my medical decision making (see chart for details).    MDM Rules/Calculators/A&P                          70 year old male with a history of lumbar radiculopathy s/p ALIF L4-L5 and L5S1 with left-sided XLIF at L2-3 and L3-4 with stage II of T10 to pelvis pedicle screw fixation with osteotomies and posterior lateral arthrodesis with autograft and allograft who presents the emergency department with a pelvic abscess.  Patient underwent work-up in the [facility] emergency department earlier  today.  There was concern that pelvic abscess that was noted on CT was secondary to patient's previous back surgeries.  Neurosurgery was consulted and accepted the patient as a transfer.  Febrile on arrival, but Tylenol administered just prior to transport.  Vital signs are otherwise stable.  Labs have been reviewed and independently interpreted by me.  He is alluded cytosis of 15.   Transaminases and total bilirubin are elevated.  Given his history of upper abdominal pain and right shoulder pain, right upper quadrant ultrasound was obtained.  No evidence of cholecystitis, but there does appear to be a gallbladder polyp.  Lactate is normal.  Urine is not concerning for infection.   Discussed the patient with Dr. Julien Girt, neurosurgery APP, he has seen the patient in consult in the ED.  He recommends holding antibiotics at this time.  He will order MRI lumbar and thoracic spine for further evaluation.  He recommends medical consult.  He request that the patient be n.p.o. after midnight.  We will hold antibiotics as there does not appear to be an alternate source of infection at this time since this was requested by neurosurgery.  Consult appreciated.  Consult to the hospitalist team and Dr. Margo Aye will accept the patient for admission. The patient appears reasonably stabilized for admission considering the current resources, flow, and capabilities available in the ED at this time, and I doubt any other [facility] requiring further screening and/or treatment in the ED prior to admission.   Final Clinical Impression(s) / ED Diagnoses Final diagnoses:  Elevated transaminase level    Rx / DC Orders ED Discharge Orders     None        Barkley Boards, PA-C 01/11/21 0328    Geoffery Lyons, MD 01/11/21 [phone]

## 2021-01-11 ENCOUNTER — Inpatient Hospital Stay (HOSPITAL_COMMUNITY): Payer: BC Managed Care – PPO

## 2021-01-11 ENCOUNTER — Emergency Department (HOSPITAL_COMMUNITY): Payer: BC Managed Care – PPO

## 2021-01-11 ENCOUNTER — Encounter (HOSPITAL_COMMUNITY): Payer: Self-pay | Admitting: *Deleted

## 2021-01-11 DIAGNOSIS — Z6837 Body mass index (BMI) 37.0-37.9, adult: Secondary | ICD-10-CM | POA: Diagnosis not present

## 2021-01-11 DIAGNOSIS — Y831 Surgical operation with implant of artificial internal device as the cause of abnormal reaction of the patient, or of later complication, without mention of misadventure at the time of the procedure: Secondary | ICD-10-CM | POA: Diagnosis present

## 2021-01-11 DIAGNOSIS — K824 Cholesterolosis of gallbladder: Secondary | ICD-10-CM | POA: Diagnosis present

## 2021-01-11 DIAGNOSIS — R7401 Elevation of levels of liver transaminase levels: Secondary | ICD-10-CM | POA: Diagnosis present

## 2021-01-11 DIAGNOSIS — L02212 Cutaneous abscess of back [any part, except buttock]: Secondary | ICD-10-CM

## 2021-01-11 DIAGNOSIS — Z20822 Contact with and (suspected) exposure to covid-19: Secondary | ICD-10-CM | POA: Diagnosis present

## 2021-01-11 DIAGNOSIS — G061 Intraspinal abscess and granuloma: Secondary | ICD-10-CM | POA: Diagnosis present

## 2021-01-11 DIAGNOSIS — T847XXA Infection and inflammatory reaction due to other internal orthopedic prosthetic devices, implants and grafts, initial encounter: Secondary | ICD-10-CM | POA: Diagnosis not present

## 2021-01-11 DIAGNOSIS — L0291 Cutaneous abscess, unspecified: Secondary | ICD-10-CM | POA: Diagnosis present

## 2021-01-11 DIAGNOSIS — Z981 Arthrodesis status: Secondary | ICD-10-CM | POA: Diagnosis not present

## 2021-01-11 DIAGNOSIS — M4624 Osteomyelitis of vertebra, thoracic region: Secondary | ICD-10-CM | POA: Diagnosis present

## 2021-01-11 DIAGNOSIS — Z8261 Family history of arthritis: Secondary | ICD-10-CM | POA: Diagnosis not present

## 2021-01-11 DIAGNOSIS — K5900 Constipation, unspecified: Secondary | ICD-10-CM | POA: Diagnosis present

## 2021-01-11 DIAGNOSIS — M1990 Unspecified osteoarthritis, unspecified site: Secondary | ICD-10-CM | POA: Diagnosis present

## 2021-01-11 DIAGNOSIS — E876 Hypokalemia: Secondary | ICD-10-CM | POA: Diagnosis present

## 2021-01-11 DIAGNOSIS — Z79899 Other long term (current) drug therapy: Secondary | ICD-10-CM | POA: Diagnosis not present

## 2021-01-11 DIAGNOSIS — M4620 Osteomyelitis of vertebra, site unspecified: Secondary | ICD-10-CM | POA: Diagnosis not present

## 2021-01-11 DIAGNOSIS — R739 Hyperglycemia, unspecified: Secondary | ICD-10-CM | POA: Diagnosis present

## 2021-01-11 DIAGNOSIS — Z96642 Presence of left artificial hip joint: Secondary | ICD-10-CM | POA: Diagnosis present

## 2021-01-11 DIAGNOSIS — T847XXD Infection and inflammatory reaction due to other internal orthopedic prosthetic devices, implants and grafts, subsequent encounter: Secondary | ICD-10-CM | POA: Diagnosis not present

## 2021-01-11 DIAGNOSIS — T8463XA Infection and inflammatory reaction due to internal fixation device of spine, initial encounter: Secondary | ICD-10-CM | POA: Diagnosis present

## 2021-01-11 DIAGNOSIS — G4733 Obstructive sleep apnea (adult) (pediatric): Secondary | ICD-10-CM | POA: Diagnosis present

## 2021-01-11 DIAGNOSIS — E669 Obesity, unspecified: Secondary | ICD-10-CM | POA: Diagnosis present

## 2021-01-11 DIAGNOSIS — A419 Sepsis, unspecified organism: Secondary | ICD-10-CM | POA: Diagnosis present

## 2021-01-11 DIAGNOSIS — B9620 Unspecified Escherichia coli [E. coli] as the cause of diseases classified elsewhere: Secondary | ICD-10-CM | POA: Diagnosis present

## 2021-01-11 LAB — URINALYSIS, ROUTINE W REFLEX MICROSCOPIC
Bacteria, UA: NONE SEEN
Bilirubin Urine: NEGATIVE
Glucose, UA: NEGATIVE mg/dL
Ketones, ur: NEGATIVE mg/dL
Leukocytes,Ua: NEGATIVE
Nitrite: NEGATIVE
Protein, ur: 30 mg/dL — AB
Specific Gravity, Urine: 1.033 — ABNORMAL HIGH (ref 1.005–1.030)
pH: 5 (ref 5.0–8.0)

## 2021-01-11 LAB — GLUCOSE, CAPILLARY
Glucose-Capillary: 101 mg/dL — ABNORMAL HIGH (ref 70–99)
Glucose-Capillary: 106 mg/dL — ABNORMAL HIGH (ref 70–99)
Glucose-Capillary: 116 mg/dL — ABNORMAL HIGH (ref 70–99)
Glucose-Capillary: 126 mg/dL — ABNORMAL HIGH (ref 70–99)
Glucose-Capillary: 131 mg/dL — ABNORMAL HIGH (ref 70–99)

## 2021-01-11 LAB — SEDIMENTATION RATE: Sed Rate: 29 mm/hr — ABNORMAL HIGH (ref 0–16)

## 2021-01-11 LAB — COMPREHENSIVE METABOLIC PANEL
ALT: 103 U/L — ABNORMAL HIGH (ref 0–44)
AST: 122 U/L — ABNORMAL HIGH (ref 15–41)
Albumin: 2.3 g/dL — ABNORMAL LOW (ref 3.5–5.0)
Alkaline Phosphatase: 103 U/L (ref 38–126)
Anion gap: 8 (ref 5–15)
BUN: 20 mg/dL (ref 8–23)
CO2: 24 mmol/L (ref 22–32)
Calcium: 7.7 mg/dL — ABNORMAL LOW (ref 8.9–10.3)
Chloride: 103 mmol/L (ref 98–111)
Creatinine, Ser: 0.94 mg/dL (ref 0.61–1.24)
GFR, Estimated: >60 mL/min (ref >60)
Glucose, Bld: 163 mg/dL — ABNORMAL HIGH (ref 70–99)
Potassium: 3.3 mmol/L — ABNORMAL LOW (ref 3.5–5.1)
Sodium: 135 mmol/L (ref 135–145)
Total Bilirubin: 1.9 mg/dL — ABNORMAL HIGH (ref 0.3–1.2)
Total Protein: 5.5 g/dL — ABNORMAL LOW (ref 6.5–8.1)

## 2021-01-11 LAB — HIV ANTIBODY (ROUTINE TESTING W REFLEX): HIV Screen 4th Generation wRfx: NONREACTIVE

## 2021-01-11 LAB — CBC
HCT: 36.8 % — ABNORMAL LOW (ref 39.0–52.0)
Hemoglobin: 12.1 g/dL — ABNORMAL LOW (ref 13.0–17.0)
MCH: 28.1 pg (ref 26.0–34.0)
MCHC: 32.9 g/dL (ref 30.0–36.0)
MCV: 85.6 fL (ref 80.0–100.0)
Platelets: 177 10*3/uL (ref 150–400)
RBC: 4.3 MIL/uL (ref 4.22–5.81)
RDW: 15.2 % (ref 11.5–15.5)
WBC: 15.9 10*3/uL — ABNORMAL HIGH (ref 4.0–10.5)
nRBC: 0 % (ref 0.0–0.2)

## 2021-01-11 LAB — PROCALCITONIN: Procalcitonin: 1.61 ng/mL

## 2021-01-11 LAB — HEMOGLOBIN A1C
Hgb A1c MFr Bld: 5.9 % — ABNORMAL HIGH (ref 4.8–5.6)
Mean Plasma Glucose: 122.63 mg/dL

## 2021-01-11 LAB — PHOSPHORUS: Phosphorus: 2.7 mg/dL (ref 2.5–4.6)

## 2021-01-11 LAB — MAGNESIUM: Magnesium: 1.9 mg/dL (ref 1.7–2.4)

## 2021-01-11 LAB — C-REACTIVE PROTEIN: CRP: 26 mg/dL — ABNORMAL HIGH (ref <1.0)

## 2021-01-11 IMAGING — US US ABDOMEN LIMITED
1 series · 14 of 25 positions shown · non-contrast
Comparison: None.

CLINICAL DATA: Elevated transaminase

EXAM:
ULTRASOUND ABDOMEN LIMITED RIGHT UPPER QUADRANT

[Series 1: us abdomen limited ruq (liver/gb) · 14 of 60 slices shown]
[im 1/60]
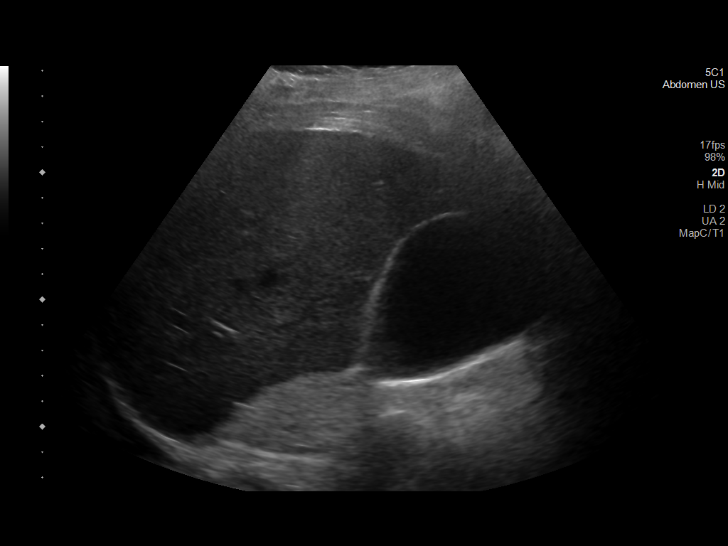
[im 5/60]
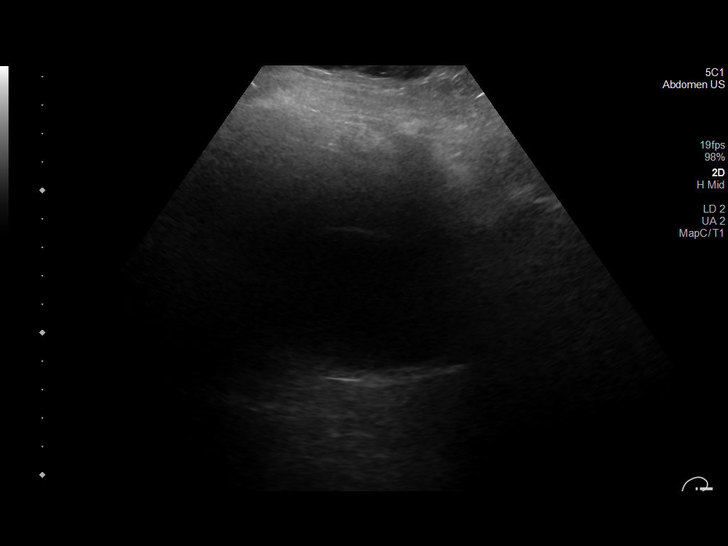
[im 10/60]
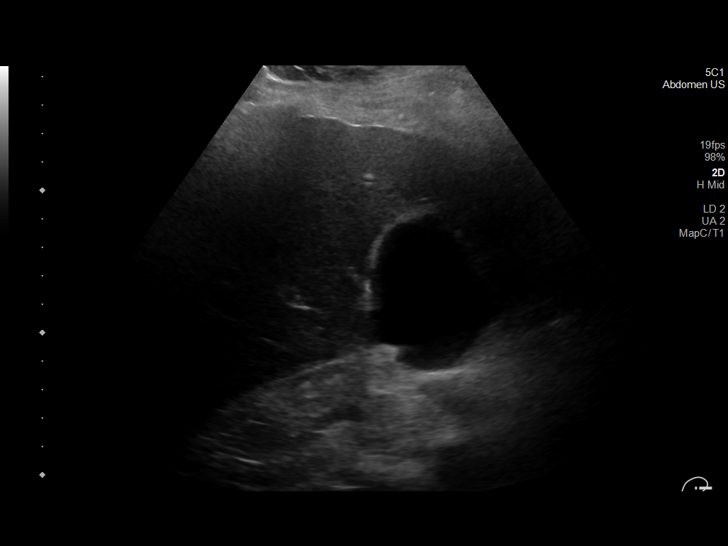
[im 15/60]
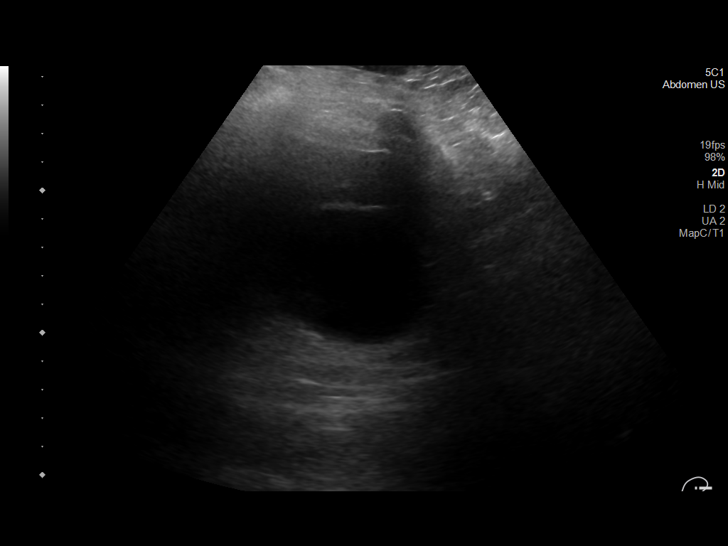
[im 20/60]
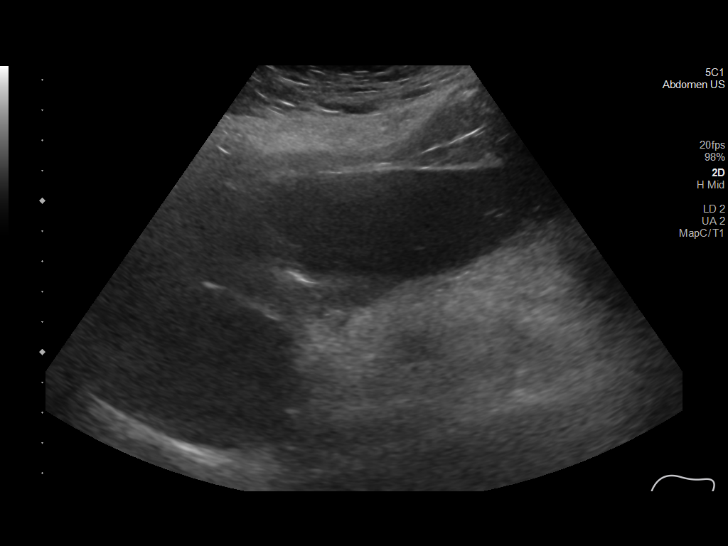
[im 23/60]
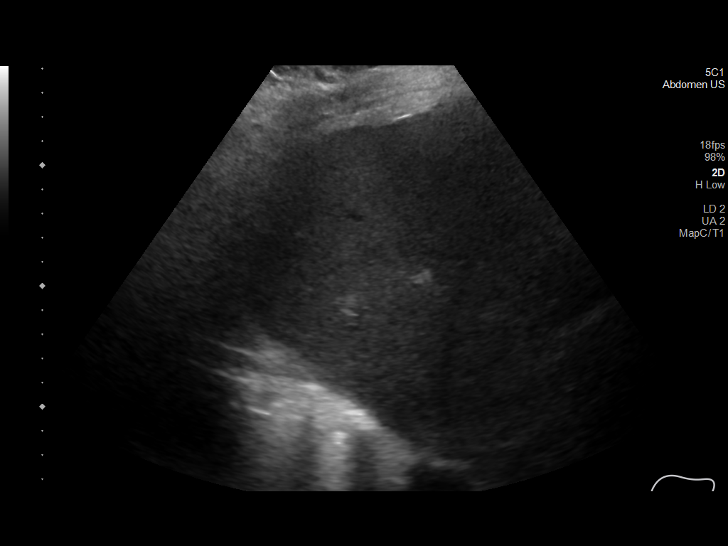
[im 28/60]
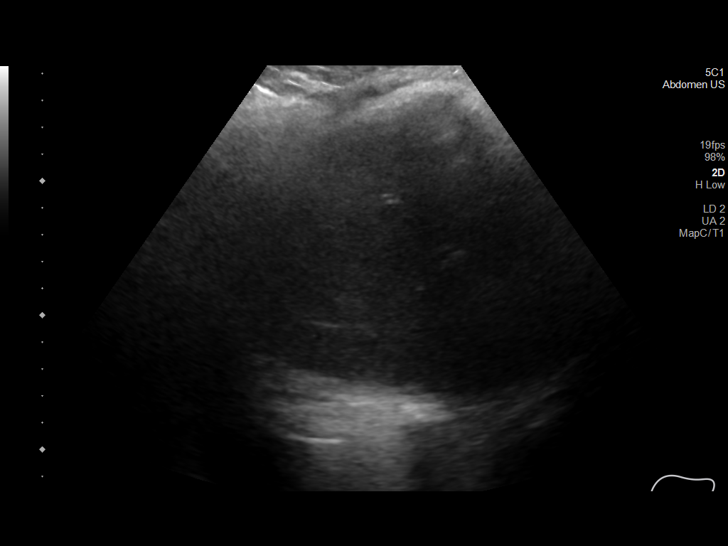
[im 32/60]
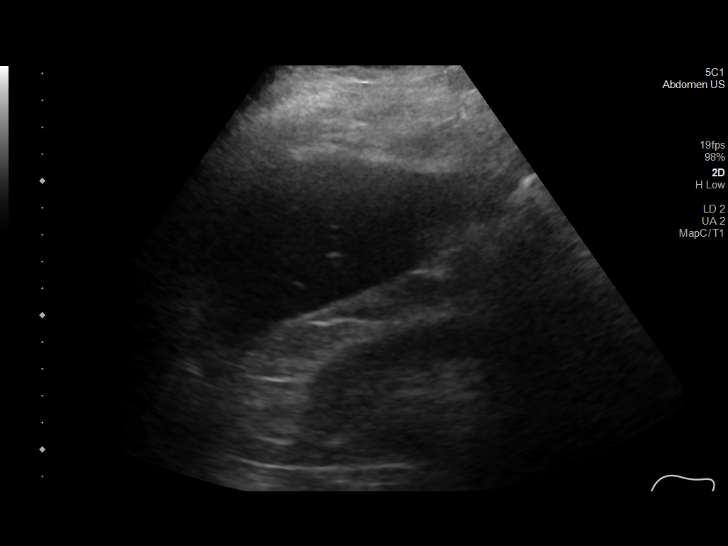
[im 37/60]
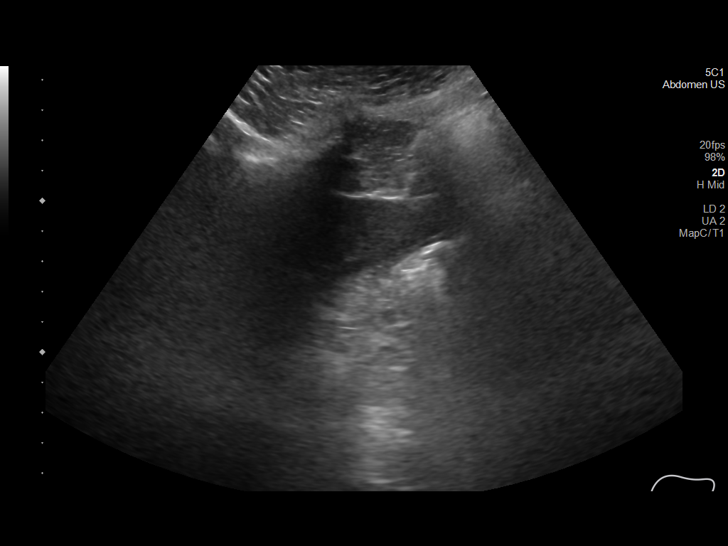
[im 40/60]
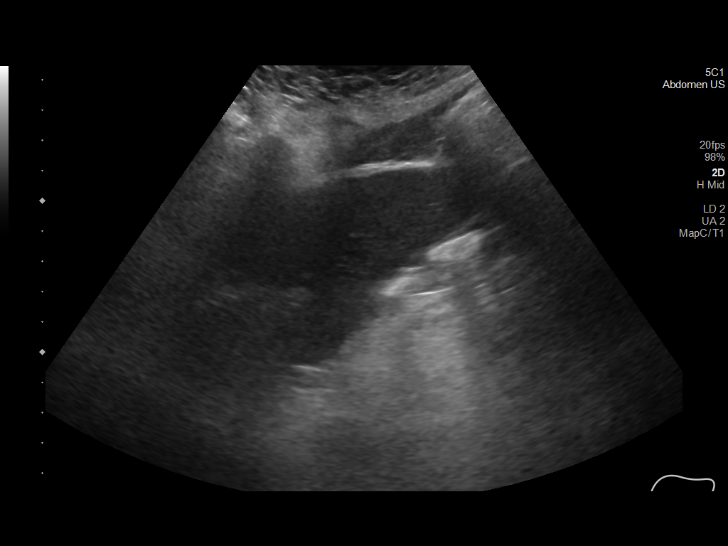
[im 45/60]
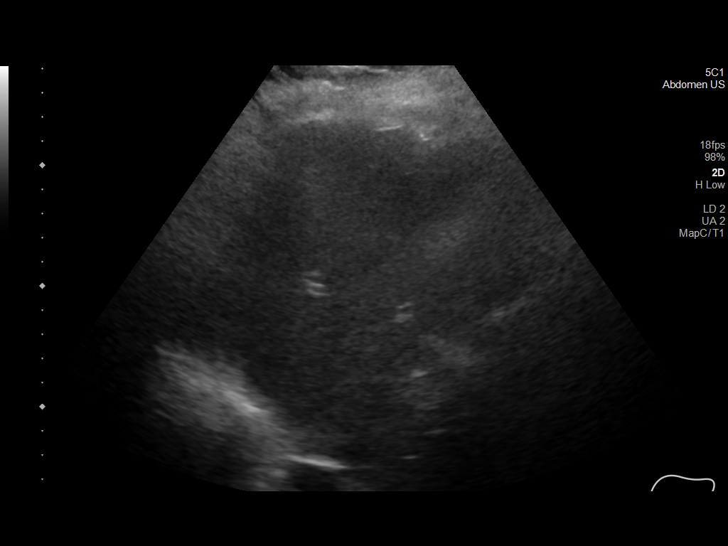
[im 50/60]
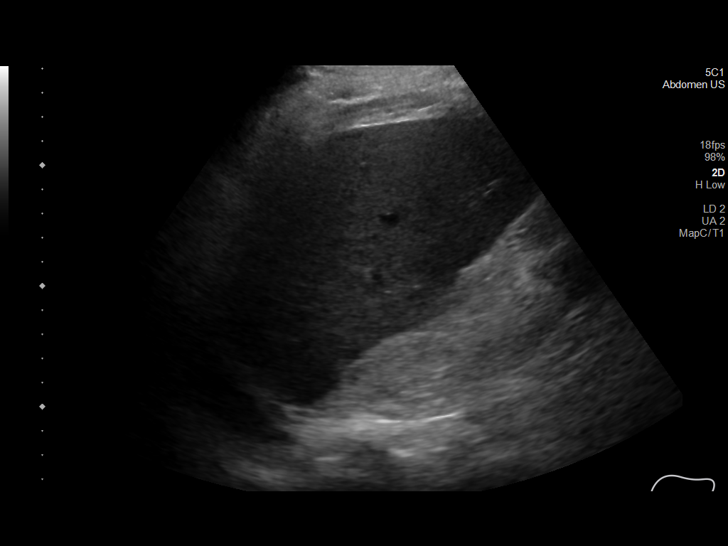
[im 55/60]
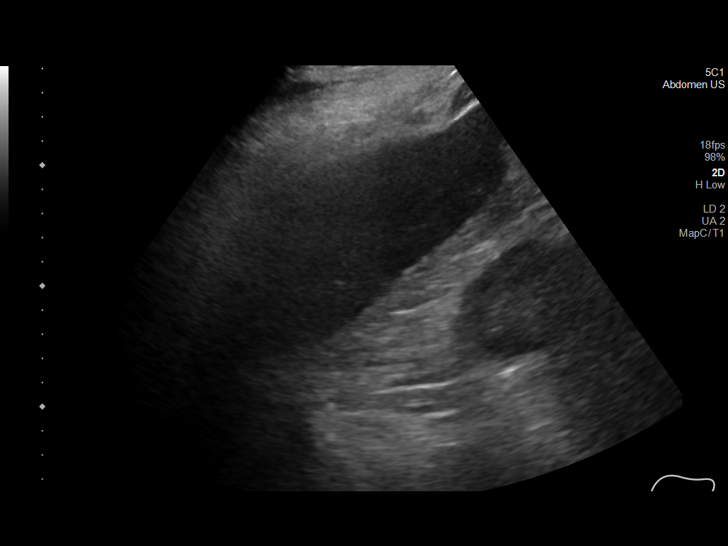
[im 60/60]
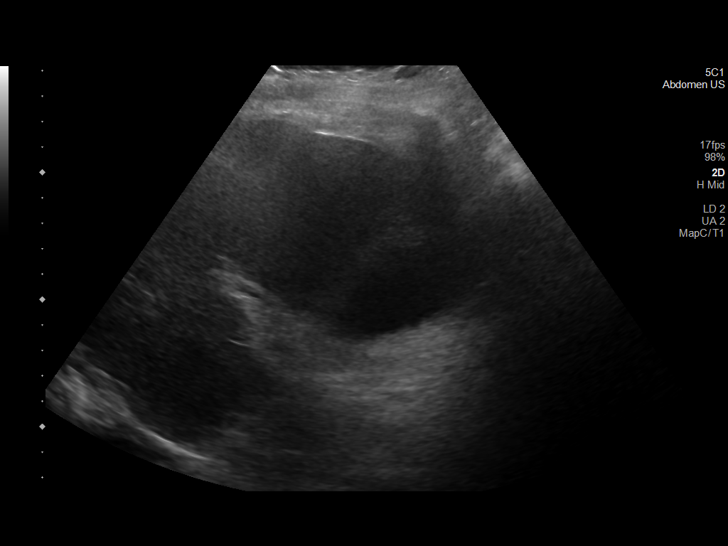

[14 of 25 positions shown; findings below may reference images not displayed]

FINDINGS: Gallbladder:

10 mm gallbladder polyp, although without vascularity. No gallstones
or gallbladder wall thickening. Negative sonographic Murphy's sign.

Common bile duct:

Diameter: 4 mm

Liver:

Hyperechoic hepatic parenchyma. No focal hepatic lesion is seen.
Portal vein is patent on color Doppler imaging with normal direction
of blood flow towards the liver.

Other: None.
IMPRESSION: Hyperechoic hepatic parenchyma, suggesting hepatic steatosis.

10 mm gallbladder polyp. Given size, outpatient surgical
consultation is suggested for consideration of elective
cholecystectomy. If a conservative approach is desired, this is
amenable to annual follow-up ultrasound.

## 2021-01-11 IMAGING — MR MR THORACIC SPINE WO/W CM
9 of 18 series · 22 of 48 positions shown · IV contrast (gadavist)
Comparison: Scoliosis radiographs [DATE] and earlier.

CLINICAL DATA: 70-year-old male with fever and "low back abscess".
Prior spine surgery in [H6].

Approximately 1 week of increasing left side back and flank pain.
Found to have large 9.6 x 7.7 x 10.8 cm left flank or pelvic
loculated fluid collection at outside facility suspicious for seroma
versus abscess
EXAM:
MRI CERVICAL, THORACIC AND LUMBAR SPINE WITHOUT AND WITH CONTRAST
TECHNIQUE: Multiplanar and multiecho pulse sequences of the cervical spine, to
include the craniocervical junction and cervicothoracic junction,
and thoracic and lumbar spine, were obtained without and with
intravenous contrast.
CONTRAST:  10mL GADAVIST GADOBUTROL 1 MMOL/ML IV SOLN

[Series 16: T1 · sagittal · 5.0mm · 1.46mm/px · 1 of 10 slices shown (1 of 5)]
[im 1/10]
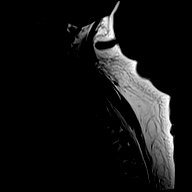

[Series 17: T1 · sagittal · 5.0mm · 1.23mm/px · 1 of 9 slices shown (2 of 5)]
[im 1/9]
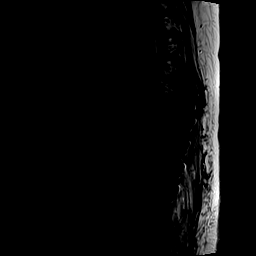

[Series 25: T2 · sagittal · 3.0mm · 0.76mm/px · 2 of 18 slices shown (1 of 4)]
[im 1/18]
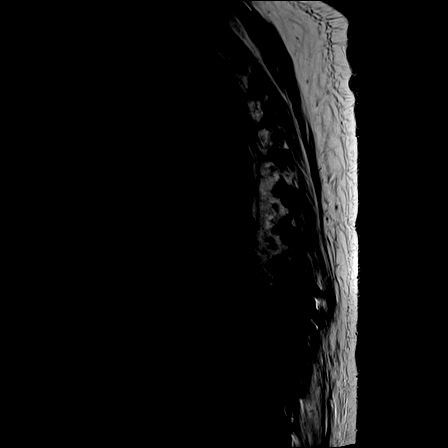
[im 18/18]
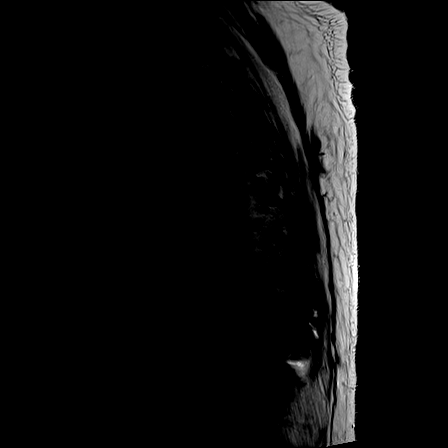

[Series 26: T1 · sagittal · 3.0mm · 0.76mm/px · 2 of 18 slices shown (3 of 5)]
[im 1/18]
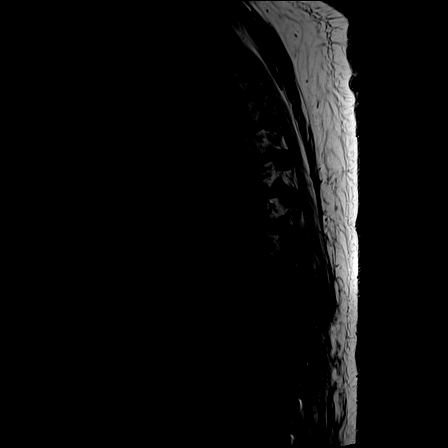
[im 18/18]
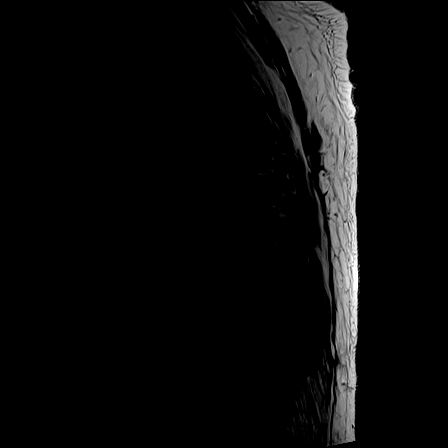

[Series 27: T2 · axial · 4.0mm · 0.59mm/px · z∈[-231,+4]mm · 4 of 39 slices shown (2 of 4)]
[im 1/39]
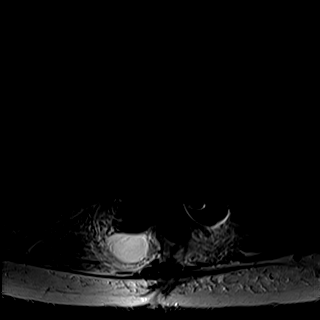
[im 13/39]
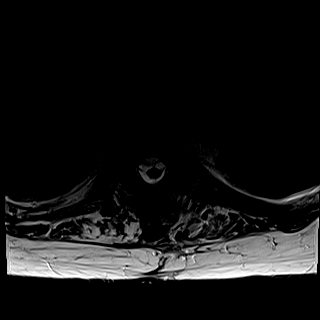
[im 26/39]
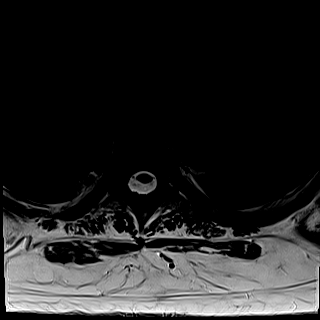
[im 39/39]
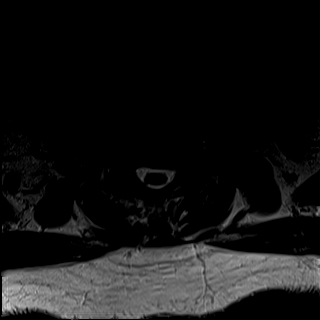

[Series 29: T1 · axial · non-contrast · 4.0mm · 0.31mm/px · z∈[-231,+4]mm · 4 of 39 slices shown (4 of 5)]
[im 1/39]
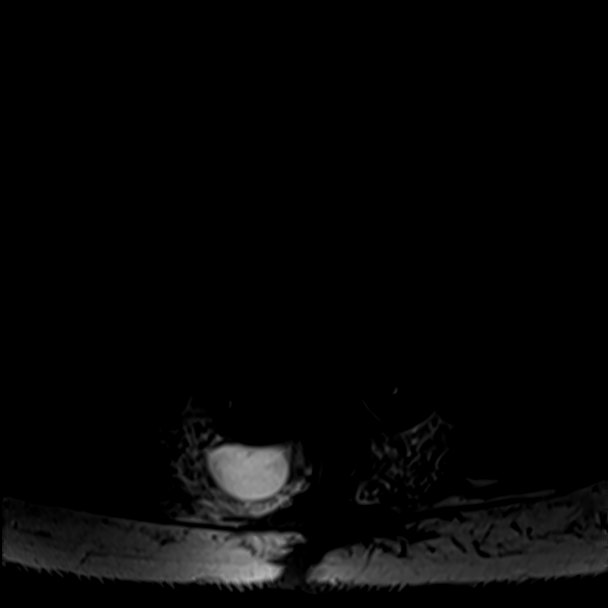
[im 13/39]
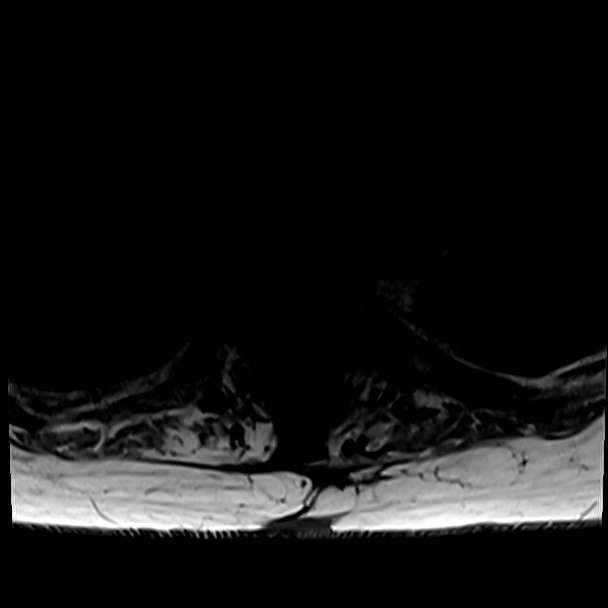
[im 26/39]
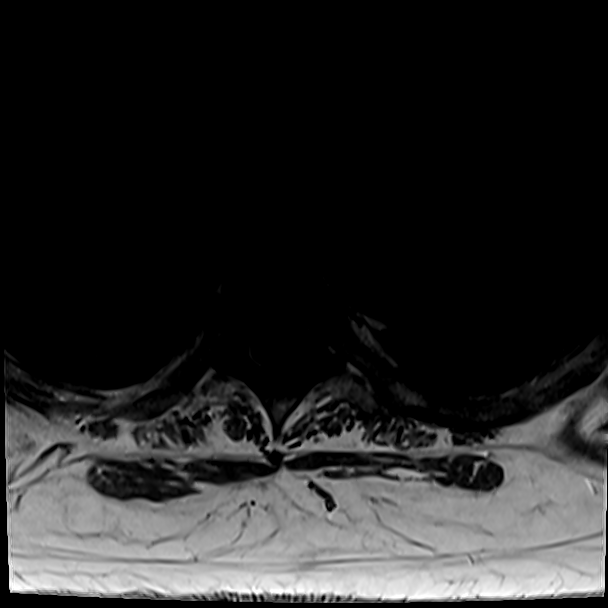
[im 39/39]
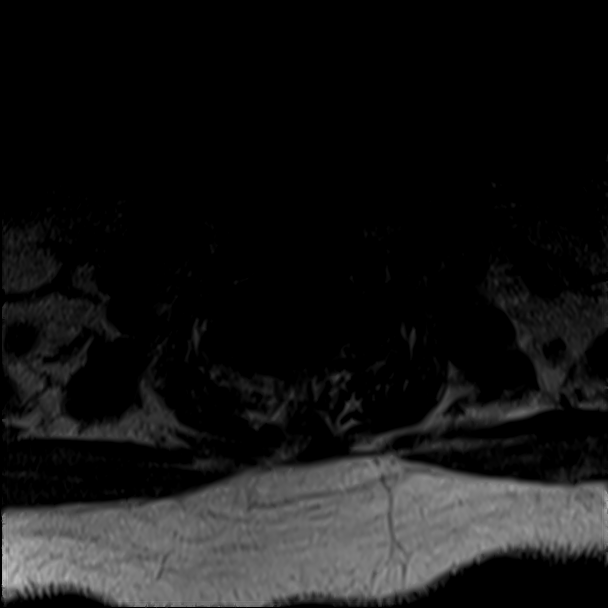

[Series 30: T2 · sagittal · 4.0mm · 0.73mm/px · 2 of 15 slices shown (3 of 4)]
[im 1/15]
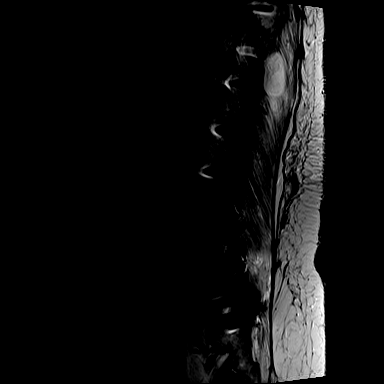
[im 15/15]
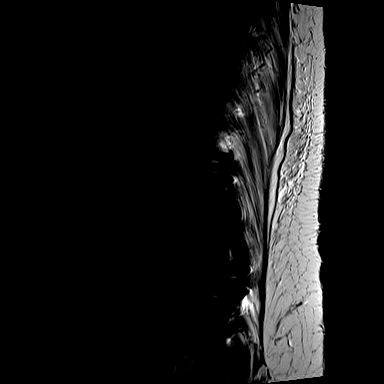

[Series 32: T1 · sagittal · 4.0mm · 0.88mm/px · 2 of 15 slices shown (5 of 5)]
[im 1/15]
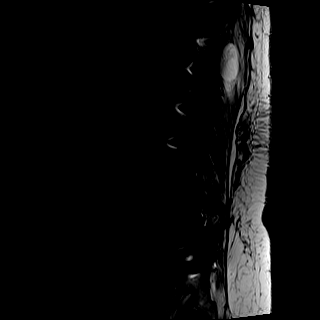
[im 15/15]
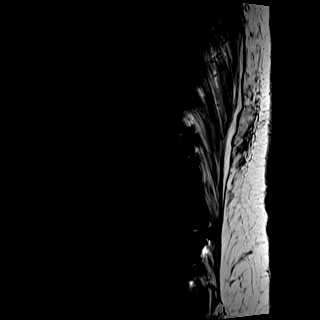

[Series 33: T2 · axial · 4.0mm · 0.57mm/px · z∈[-442,-240]mm · 4 of 35 slices shown (4 of 4)]
[im 1/35]
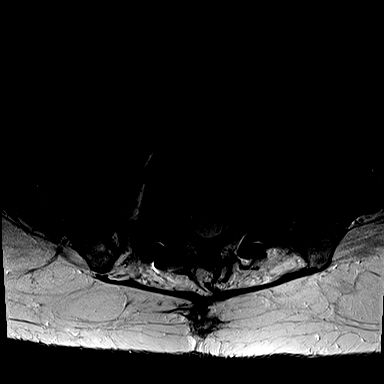
[im 12/35]
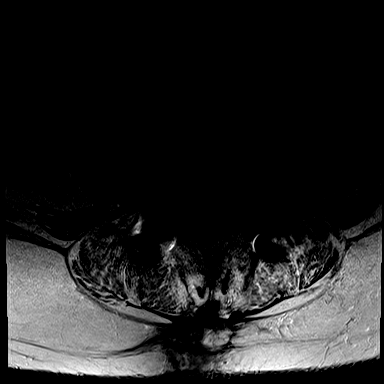
[im 23/35]
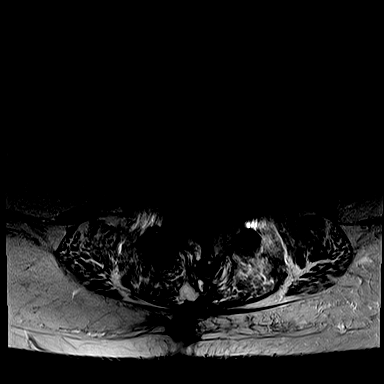
[im 35/35]
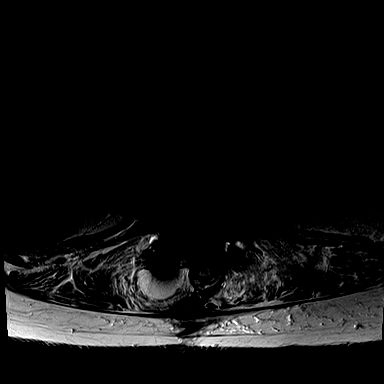

[22 of 48 positions shown; findings below may reference images not displayed]

FINDINGS: MRI THORACIC SPINE FINDINGS

Limited cervical spine imaging: Straightening of cervical lordosis.
Generalized decreased T1 bone marrow signal.

Thoracic spine segmentation:  Normal on the comparison radiographs.

Alignment:  Preserved thoracic kyphosis.

Vertebrae: Mild generalized decreased T1 marrow signal in the
thoracic spine, although remaining within normal limits from T9 and
above.

Superimposed T10 through lumbar posterior spinal hardware artifact.
No convincing T10 through T12 marrow edema on the thoracic images.

Cord: Abnormal dural thickening and enhancement extending from the
lower thoracic spine which is affected by hardware artifact cephalad
through the T4 level. Beginning at T7 and continuing inferiorly is a
ventral epidural fluid collection with rim enhancement measuring up
to 6 mm in thickness (at T8 series 27, image 25) which effaces the
ventral CSF space at many lower thoracic levels. This continues
toward the thoracolumbar junction, although thecal sac detail is
limited by hardware artifact at T11 and T12.

No definite associated cord edema. Above T4 the thecal sac and dura
appear normal. Conus medullaris is obscured.

Paraspinal and other soft tissues: Muscle postoperative changes.
Suggesting high protein content or blood. There is surrounding right
thoracic erector spinae muscle edema, superimposed on chronic
postoperative changes.

Small to moderate layering bilateral pleural effusions are visible
in the chest.

Disc levels:

Disc spaces are relatively normal for age above T9.

Beginning in the midthoracic spine and continuing inferiorly is
moderate and ultimately severe bilateral posterior facet
hypertrophy.

Beginning at T8-T9 there is multifactorial mild spinal stenosis in
conjunction with the ventral epidural abscess.

Similar mild spinal stenosis at T9-T10.

The T10-T11 disc is abnormally T2 and STIR hyperintense, although
evaluation for enhancement is limited due to hardware artifact.
Possible mild endplate erosions there. Mild to moderate spinal
stenosis there in part due to mass effect from moderate to severe
posterior element hypertrophy on the right (series 27, image 32).

T11-T12: disc space loss with disc bulging and endplate spurring. Up
to moderate multifactorial spinal stenosis there.

T12-L1 level is slightly better depicted on the lumbar images.

MRI LUMBAR SPINE FINDINGS

Segmentation:  Normal as seen on the comparison radiographs.

Alignment:  Preserved lumbar lordosis.

Vertebrae: Diffuse hardware susceptibility artifact, with interbody
implants in addition to the posterior spinal hardware from L2-L3
through L5-S1. No definite lumbar marrow edema. No convincing sacral
marrow edema. Grossly intact SI joints.

Conus medullaris and cauda equina: Conus is obscured by hardware
artifact. Cauda equina roots so in the lower lumbar thecal sac
appear normal. Hardware artifact limits evaluation for abnormal
dural thickening and enhancement in the lumbar spine, but the dura
below L3 appears normal.

Paraspinal and other soft tissues: Posterior paraspinal muscle
postoperative changes. A small roughly 3 cm collection along the
right side posterior T12 and L1 hardware has intrinsic T1 and T2
hyperintensity as above.

Otherwise there is thin midline subcutaneous fluid or granulation
tissue which is not rim enhancing and fairly dark on T2.

No psoas muscle abscess identified.

Disc levels:

T12-L1: This level remains fairly obscured due to hardware artifact,
but on series 30, image 7 there does appear to be at least mild
spinal stenosis. Unclear whether the epidural collection extends to
this level.
L1-L2: Obscured on axial images but on sagittal series 30, image 8
there is no strong evidence of epidural abscess or spinal stenosis.
L2-L3: Obscured on axial images. Sagittal images demonstrate
interbody implant with no convincing epidural abscess. There does
appear to be mild spinal stenosis.
L3-L4: Sequelae of decompression and posterior, interbody fusion
here. No definite spinal stenosis or epidural abscess.
L4-L5: Decompression and fusion. Left side posterior element
hypertrophy results in mild left L4 foraminal stenosis. No spinal
stenosis or epidural abscess.
L5-S1: Status post decompression and fusion. No stenosis or abscess.
IMPRESSION: 1. Previous T10 through Sacral spinal fusion with abundant metal
hardware artifact.

2. Positive for a Ventral Epidural Abscess in the thoracic spine
tracking from the T7 superior endplate inferiorly at least to
T10-T11 - where abnormal signal in the disc is highly suspicious for
Infectious Discitis. Associated meningeal thickening and enhancement
extending from that level cephalad to T4.

3. Associated multilevel Lower Thoracic Spinal Stenosis due to
combined abscess and degenerative changes, up to moderate at T10-T11
and T11-T12.

4. No definite spinal cord edema, although detail of the conus is
obscured by hardware artifact.

5. Solitary 3 cm posterior paraspinal fluid collection about the
right T12 and L1 spinal rod, although fluid signal more resembles
hematoma than abscess.

6. Otherwise extensive postoperative signal changes to the
thoracolumbar erector spinae muscles, with ADDIS obvious intramuscular
abscess.

7. No definite lumbar epidural abscess. Visible sacrum and SI joints
also appear spared.

## 2021-01-11 IMAGING — US US BIOPSY FNA W/ IMAGING
1 series · 9 of 9 positions shown · non-contrast
Comparison: none

CLINICAL DATA: Previous instrumented spine surgery. Fever. MR
suggests ventral epidural abscess as well as posterior paraspinal
subcutaneous fluid collection adjacent to the posterior spinal
hardware. Aspiration requested

[Series 1: us fna biopsy soft tissue 1st lesion · 9 acquisitions, 9 frames shown]
[im 1/9]
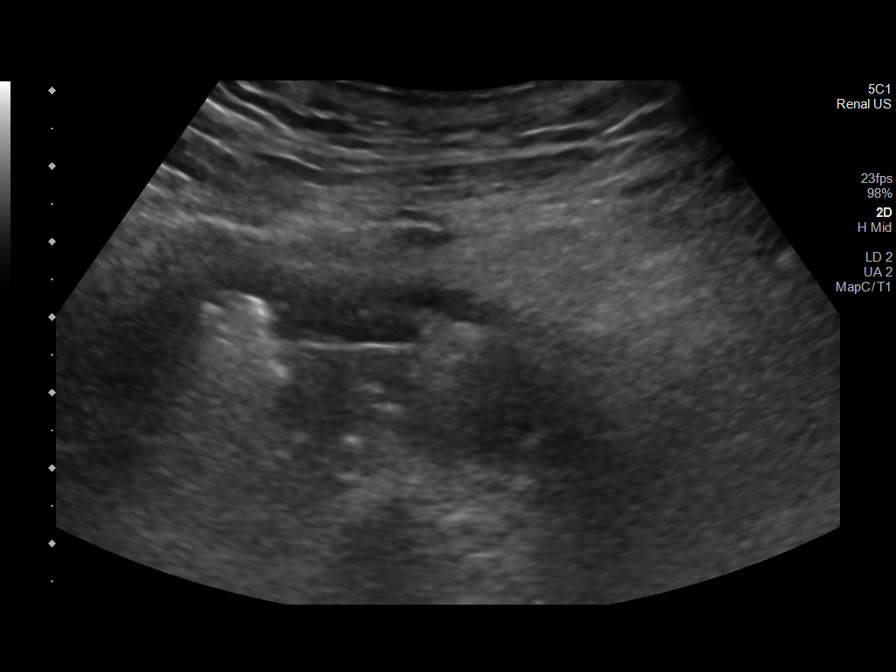
[im 2/9]
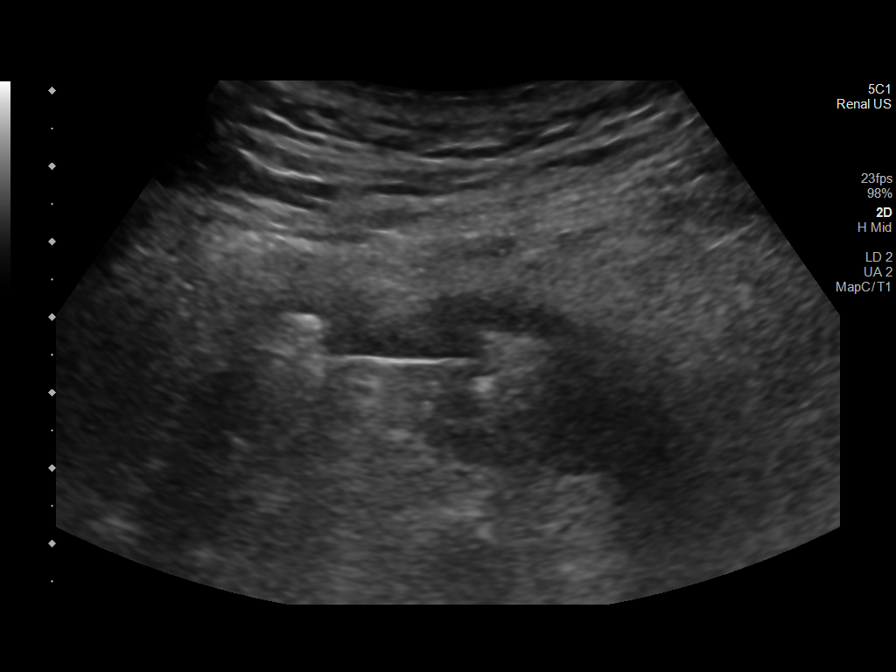
[im 3/9]
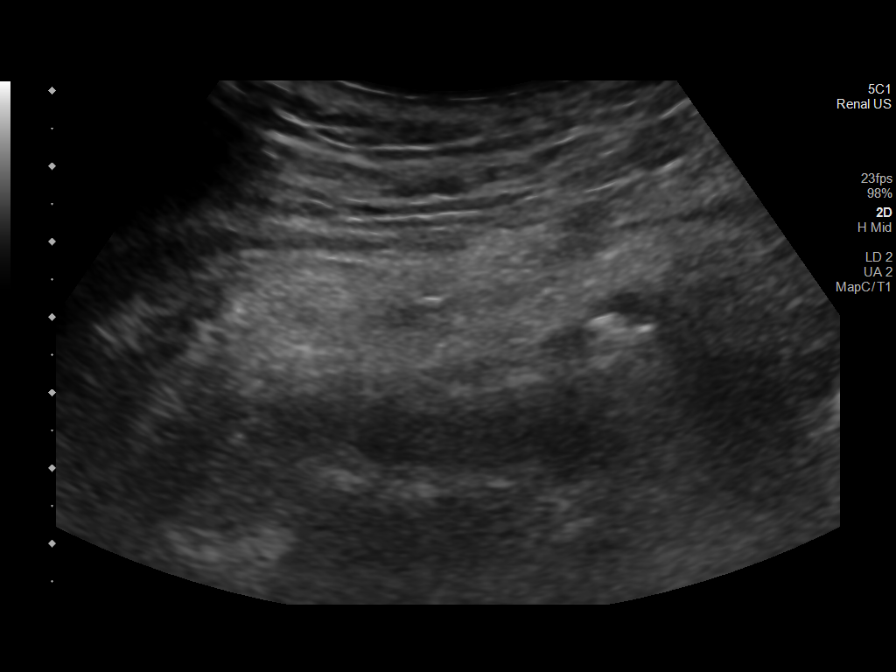
[im 4/9]
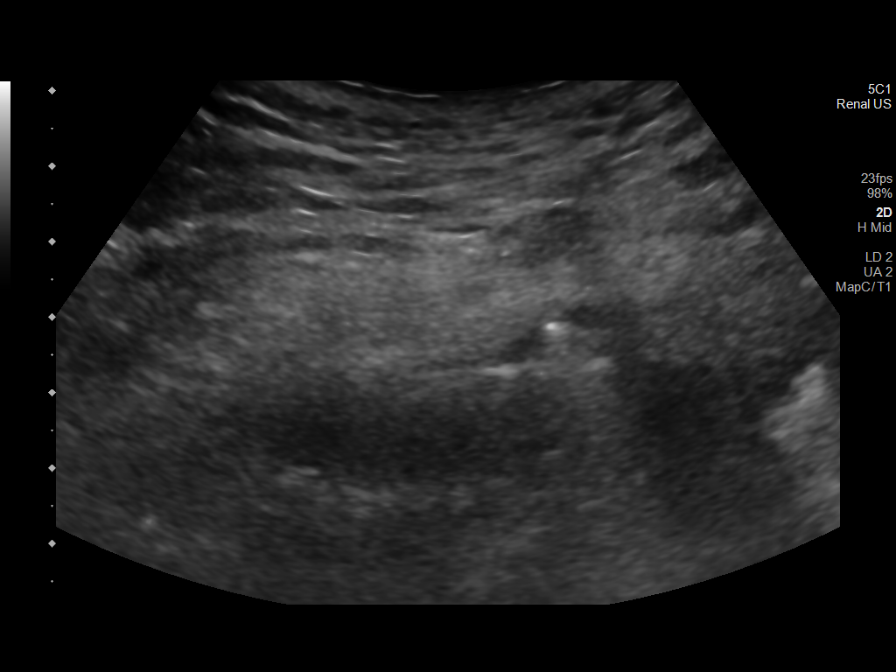
[im 5/9]
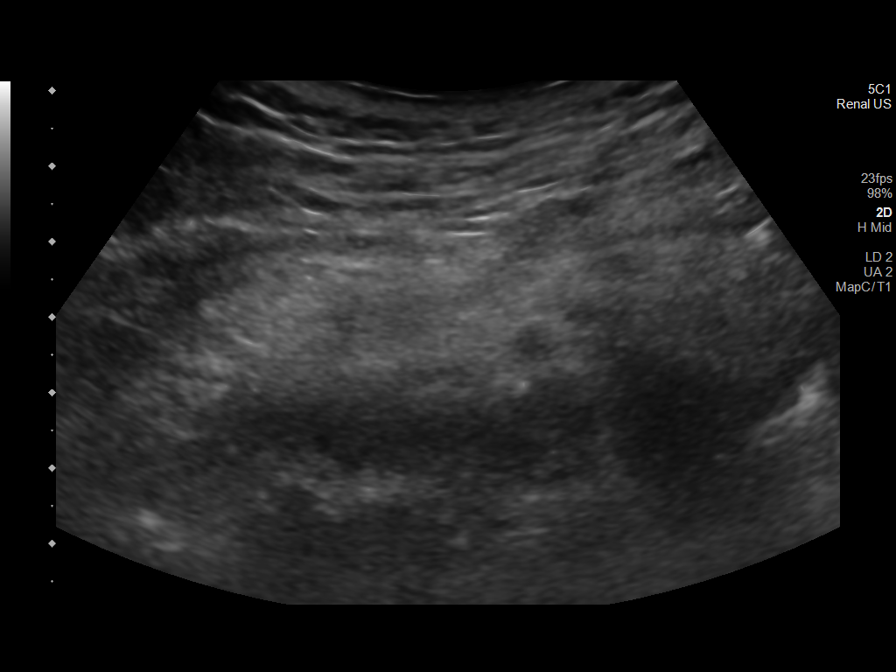
[im 6/9]
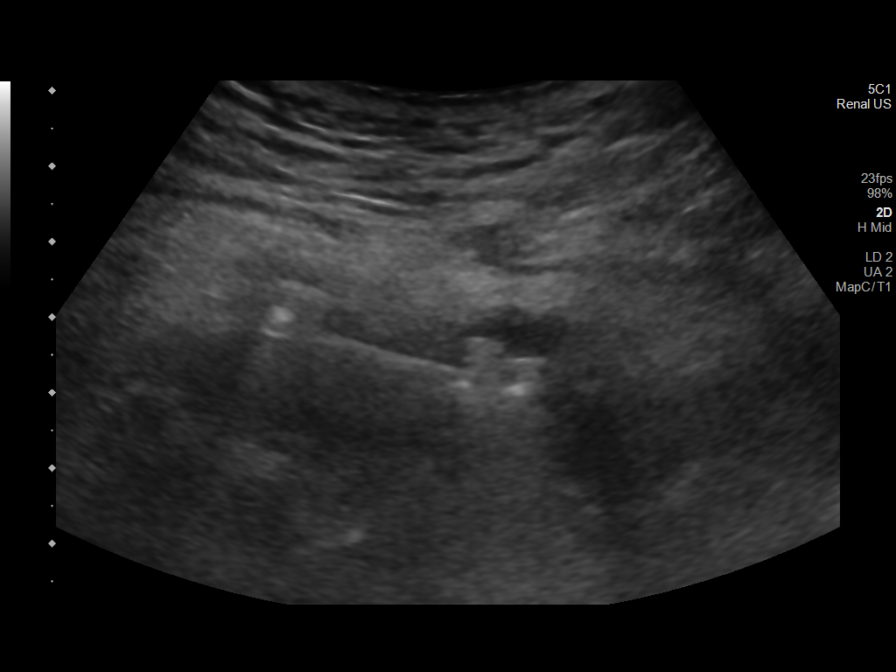
[im 7/9]
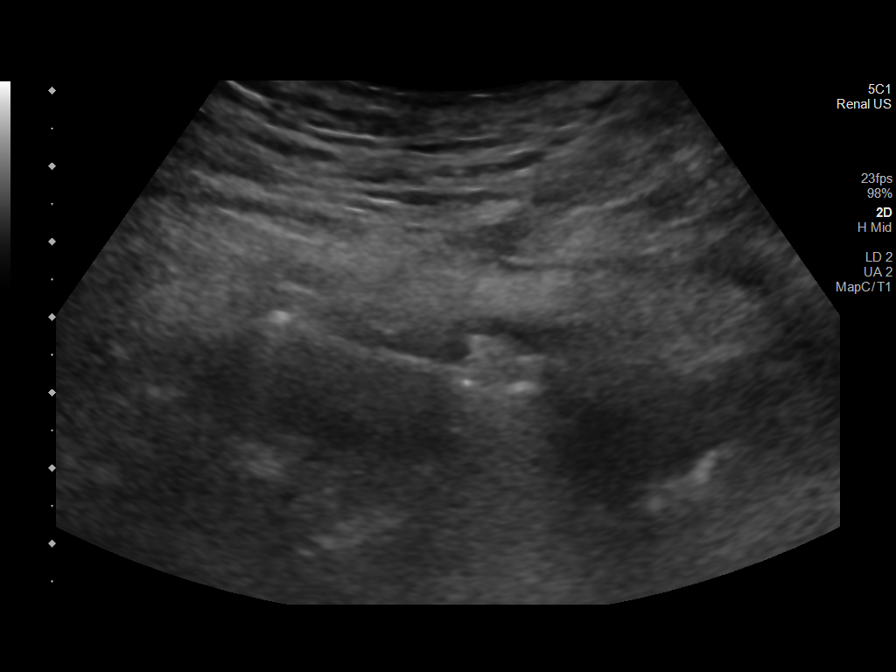
[im 8/9]
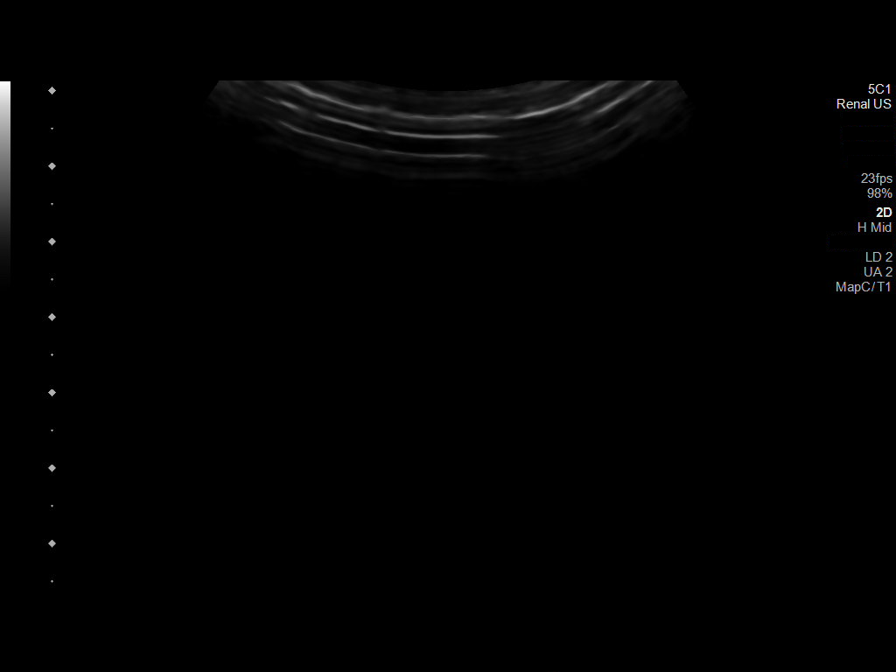
[im 9/9]
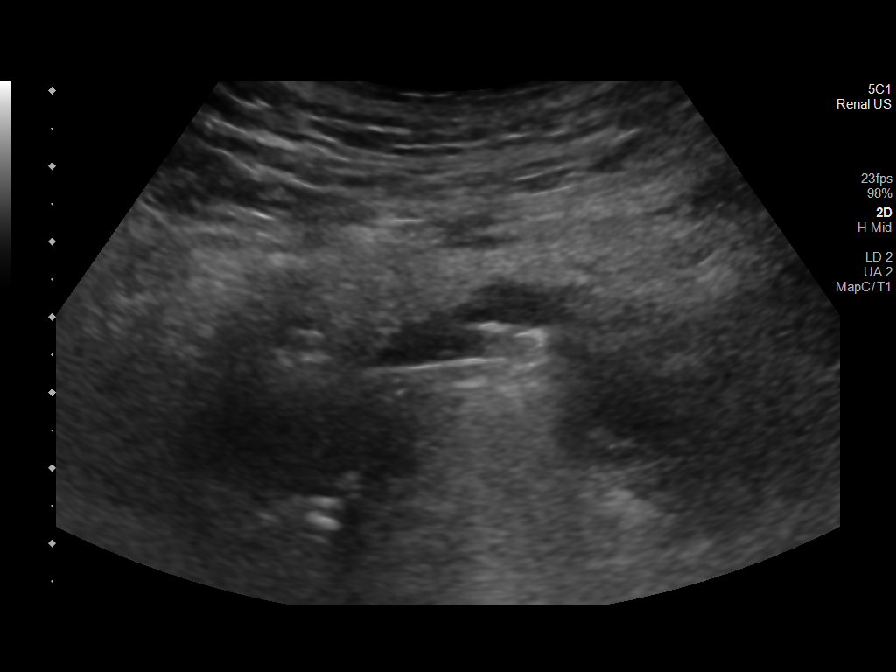

[9 of 9 positions shown; findings below may reference images not displayed]

EXAM:
ULTRASOUND GUIDED ASPIRATION BIOPSY OF POSTERIOR LUMBAR SUBCUTANEOUS
FLUID

MEDICATIONS:
lidocaine 1% subcutaneous

PROCEDURE:
The procedure, risks, benefits, and alternatives were explained to
the patient. Questions regarding the procedure were encouraged and
answered. The patient understands and consents to the procedure.

the dorsal subcutaneous fluid collection was localized. An
appropriate skin entry site was determined and marked.

The operative field was prepped with chlorhexidine in a sterile
fashion, and a sterile drape was applied covering the operative
field. A sterile gown and sterile gloves were used for the
procedure. Local anesthesia was provided with 1% Lidocaine.

Under real-time ultrasound guidance, 18 gauge spinal needle advanced
to the collection. Approximately 2 mL of 1 apparent old blood were
aspirated, sent for the requested laboratory studies. The patient
tolerated the procedure well.

COMPLICATIONS:
None.
FINDINGS: Scant return from dorsal subcutaneous collection, sent for the
requested laboratory studies.
IMPRESSION: Technically successful aspiration of dorsal subcutaneous collection
returning scant old blood, sent for requested laboratory studies.

## 2021-01-11 MED ORDER — GADOBUTROL 1 MMOL/ML IV SOLN
10.0000 mL | Freq: Once | INTRAVENOUS | Status: AC | PRN
  Administered 2021-01-11: 10 mL via INTRAVENOUS

## 2021-01-11 MED ORDER — MELATONIN 3 MG PO TABS: 3.0000 mg | ORAL_TABLET | Freq: Every evening | ORAL | Status: DC | PRN

## 2021-01-11 MED ORDER — POLYETHYLENE GLYCOL 3350 17 G PO PACK: 17.0000 g | PACK | Freq: Every day | ORAL | Status: DC | PRN

## 2021-01-11 MED ORDER — POTASSIUM CHLORIDE CRYS ER 20 MEQ PO TBCR
40.0000 meq | EXTENDED_RELEASE_TABLET | Freq: Once | ORAL | Status: AC
  Administered 2021-01-11: 40 meq via ORAL
  Filled 2021-01-11: qty 2

## 2021-01-11 MED ORDER — METOPROLOL TARTRATE 5 MG/5ML IV SOLN
5.0000 mg | Freq: Four times a day (QID) | INTRAVENOUS | Status: DC | PRN
  Administered 2021-01-11: 5 mg via INTRAVENOUS
  Filled 2021-01-11: qty 5

## 2021-01-11 MED ORDER — VANCOMYCIN HCL 1500 MG/300ML IV SOLN
1500.0000 mg | INTRAVENOUS | Status: DC
  Administered 2021-01-11 – 2021-01-12 (×2): 1500 mg via INTRAVENOUS
  Filled 2021-01-11 (×3): qty 300

## 2021-01-11 MED ORDER — LIDOCAINE HCL (PF) 1 % IJ SOLN
INTRAMUSCULAR | Status: AC
  Filled 2021-01-11: qty 5

## 2021-01-11 MED ORDER — PIPERACILLIN-TAZOBACTAM 3.375 G IVPB
3.3750 g | Freq: Three times a day (TID) | INTRAVENOUS | Status: DC
  Administered 2021-01-11 – 2021-01-15 (×13): 3.375 g via INTRAVENOUS
  Filled 2021-01-11 (×13): qty 50

## 2021-01-11 MED ORDER — LACTATED RINGERS IV SOLN: INTRAVENOUS | Status: DC

## 2021-01-11 MED ORDER — ENOXAPARIN SODIUM 60 MG/0.6ML IJ SOSY
60.0000 mg | PREFILLED_SYRINGE | INTRAMUSCULAR | Status: DC
  Administered 2021-01-11: 60 mg via SUBCUTANEOUS
  Filled 2021-01-11: qty 0.6

## 2021-01-11 MED ORDER — ENOXAPARIN SODIUM 60 MG/0.6ML IJ SOSY: 55.0000 mg | PREFILLED_SYRINGE | INTRAMUSCULAR | Status: DC

## 2021-01-11 MED ORDER — HYDRALAZINE HCL 10 MG PO TABS
10.0000 mg | ORAL_TABLET | Freq: Four times a day (QID) | ORAL | Status: DC | PRN
Start: 2021-01-11 — End: 2021-01-16

## 2021-01-11 MED ORDER — ENOXAPARIN SODIUM 60 MG/0.6ML IJ SOSY: 60.0000 mg | PREFILLED_SYRINGE | INTRAMUSCULAR | Status: DC

## 2021-01-11 MED ORDER — HYDROMORPHONE HCL 1 MG/ML IJ SOLN
1.0000 mg | INTRAMUSCULAR | Status: DC | PRN
  Administered 2021-01-11 (×3): 1 mg via INTRAVENOUS
  Filled 2021-01-11 (×3): qty 1

## 2021-01-11 MED ORDER — INSULIN ASPART 100 UNIT/ML IJ SOLN
0.0000 [IU] | INTRAMUSCULAR | Status: DC
  Administered 2021-01-11 – 2021-01-13 (×8): 2 [IU] via SUBCUTANEOUS

## 2021-01-11 MED ORDER — ACETAMINOPHEN 325 MG PO TABS
650.0000 mg | ORAL_TABLET | Freq: Four times a day (QID) | ORAL | Status: AC | PRN
  Administered 2021-01-11 – 2021-01-12 (×2): 650 mg via ORAL
  Filled 2021-01-11 (×2): qty 2

## 2021-01-11 MED ORDER — OXYCODONE HCL 5 MG PO TABS
5.0000 mg | ORAL_TABLET | Freq: Four times a day (QID) | ORAL | Status: DC | PRN
  Administered 2021-01-12 – 2021-01-14 (×4): 5 mg via ORAL
  Filled 2021-01-11 (×5): qty 1

## 2021-01-11 NOTE — H&P (View-Only) (Signed)
Reason for Consult: Pelvic abscess Referring Physician: Barkley Boards, PA-C  HPI: Marco Kennedy is a 70 y.o. male who is s/p 2 stage thoracoliumbar surgery. Stage once consisted of an ALIF at the L4-5 and L5-S1 levels with left-sided XLIF at L2-3 and L3-4 levels. The second stage consisted of T10 through pelvis fixation along with osteotomies. The patient tolerated the procedure well and was discharged on 05/07/2020 to [facility] in [place]. He was managing well and ambulating daily without the use of his cane. However, approximately 1 week ago, he began having left flank pain that continued to progressively worsen. On Monday he reports chills and "having the shakes." By Tuesday, he had generalized weakness and fell out of bed and required assistance from the fire department to help him off the floor. Wednesday, he began to feel slightly better, although this was short lived. By Friday, he was unable to tolerate the pain, weakness, and "flu like symptoms and decided to present to the ED in [place]. He was unable to be seen in [place] due to the ED census and ultimately sought evaluation at a freestanding ED in [place], [place]. He reports having 3 negative COVID tests.   Today, he continues to have generalized weakness, shakes, and feels "achy" all over. He is unable to resist gravity in his BLE and is unable to sit up in bed. His pain is manageable with laying in bed. However, his pain is significantly exacerbated up to a 8-9/10 with movement. A CT LS was obtained in the [place] ED, however, we have been unable to view it due to incompatibility issues between our EMR and image viewing software. The [place] EDP reported that there was a moderate size abscess located near the pelvic hardware. It was ultimately decided to transfer the patient to [facility] for further management.   Past Medical History:  Diagnosis Date   Arthritis    arthritis-bilateral knees, left  Hip.   Degenerative lumbar spinal stenosis    EKG abnormality    06-08-16- being evaluated by cardiologist  in [place]   Idiopathic scoliosis of lumbar region    Joint stiffness    Knee pain    Leg swelling    Low back pain    Achy, shooting pain.  Hurts to Walk or stand up straight.   Lumbar radiculopathy    Scoliosis concern    Sleep apnea    Bipap use nightly 25/19 -3l/m oxygen   Spondylolisthesis of lumbar region    Transfusion history    [place] ,[place] after bleeding post colon polyp removal and colonscopy procedure    Past Surgical History:  Procedure Laterality Date   ABDOMINAL EXPOSURE N/A 04/18/2020   Procedure: ABDOMINAL EXPOSURE;  Surgeon: [name], MD;  Location: [facility] OR;  Service: Vascular;  Laterality: N/A;   ANTERIOR LAT LUMBAR FUSION Left 04/18/2020   Procedure: ANTERIOR LATERAL LUMBAR FUSION LUMBAR TWO-THREE, LUMBAR THREE-FOUR;  Surgeon: [name], MD;  Location: [facility] OR;  Service: Neurosurgery;  Laterality: Left;   ANTERIOR LUMBAR FUSION N/A 04/18/2020   Procedure: Lumbar Four-Five Lumbar Five- Sacral One Anterior lumbar Interbody Fusion;  Surgeon: [name], MD;  Location: [facility] OR;  Service: Neurosurgery;  Laterality: N/A;   APPLICATION OF INTRAOPERATIVE CT SCAN N/A 04/21/2020   Procedure: APPLICATION OF INTRAOPERATIVE CT SCAN;  Surgeon: [name], MD;  Location: [facility] OR;  Service: Neurosurgery;  Laterality: N/A;   BACK SURGERY     lumbar disc surgery  COLONOSCOPY W/ POLYPECTOMY     HERNIA REPAIR Left    left inguinal hernia repair   KNEE ARTHROSCOPY Right 2003    -scope for meniscus   POSTERIOR LUMBAR FUSION 4 LEVEL N/A 04/21/2020   Procedure: Thoracic ten to pelvis pedicle screw fixation with osteotomies;  Surgeon: [name], MD;  Location: [facility] OR;  Service: Neurosurgery;  Laterality: N/A;   TOTAL HIP ARTHROPLASTY Left 06/15/2016   Procedure: LEFT TOTAL HIP ARTHROPLASTY ANTERIOR APPROACH;  Surgeon: [name], MD;  Location: [facility] ORS;   Service: Orthopedics;  Laterality: Left;    Family History  Problem Relation Age of Onset   Hypertension Mother    Arthritis Mother    Stomach cancer Father    Hypertension Brother     Social History:  reports that he has never smoked. He has never used smokeless tobacco. He reports current alcohol use. He reports that he does not use drugs.  Allergies: No Known Allergies  Medications: I have reviewed the patient's current medications.  Results for orders placed or performed during the hospital encounter of 01/10/21 (from the past 48 hour(s))  CBC with Differential     Status: Abnormal   Collection Time: 01/10/21 10:18 PM  Result Value Ref Range   WBC 15.1 (H) 4.0 - 10.5 K/uL   RBC 4.73 4.22 - 5.81 MIL/uL   Hemoglobin 13.1 13.0 - 17.0 g/dL   HCT 37.4 82.7 - 07.8 %   MCV 87.1 80.0 - 100.0 fL   MCH 27.7 26.0 - 34.0 pg   MCHC 31.8 30.0 - 36.0 g/dL   RDW 67.5 44.9 - 20.1 %   Platelets 173 150 - 400 K/uL   nRBC 0.0 0.0 - 0.2 %   Neutrophils Relative % 85 %   Neutro Abs 12.7 (H) 1.7 - 7.7 K/uL   Lymphocytes Relative 2 %   Lymphs Abs 0.4 (L) 0.7 - 4.0 K/uL   Monocytes Relative 9 %   Monocytes Absolute 1.3 (H) 0.1 - 1.0 K/uL   Eosinophils Relative 0 %   Eosinophils Absolute 0.0 0.0 - 0.5 K/uL   Basophils Relative 0 %   Basophils Absolute 0.0 0.0 - 0.1 K/uL   Immature Granulocytes 4 %   Abs Immature Granulocytes 0.60 (H) 0.00 - 0.07 K/uL    Comment: Performed at [facility], [address] [place], [place] [address]  Lactic acid, plasma     Status: None   Collection Time: 01/10/21 10:18 PM  Result Value Ref Range   Lactic Acid, Venous 1.4 0.5 - 1.9 mmol/L    Comment: Performed at [facility], [address] [place], [place] [address]  Comprehensive metabolic panel     Status: Abnormal   Collection Time: 01/10/21 10:18 PM  Result Value Ref Range   Sodium 136 135 - 145 mmol/L   Potassium 3.4 (L) 3.5 - 5.1 mmol/L   Chloride 100 98 - 111 mmol/L   CO2 27 22 -  32 mmol/L   Glucose, Bld 167 (H) 70 - 99 mg/dL    Comment: Glucose reference range applies only to samples taken after fasting for at least 8 hours.   BUN 24 (H) 8 - 23 mg/dL   Creatinine, Ser 8.32 0.61 - 1.24 mg/dL   Calcium 8.0 (L) 8.9 - 10.3 mg/dL   Total Protein 5.9 (L) 6.5 - 8.1 g/dL   Albumin 2.4 (L) 3.5 - 5.0 g/dL   AST 549 (H) 15 - 41 U/L   ALT  112 (H) 0 - 44 U/L   Alkaline Phosphatase 108 38 - 126 U/L   Total Bilirubin 1.5 (H) 0.3 - 1.2 mg/dL   GFR, Estimated >82 >50 mL/min    Comment: (NOTE) Calculated using the CKD-EPI Creatinine Equation (2021)    Anion gap 9 5 - 15    Comment: Performed at [facility], [address] [place], [place] [address]  Lipase, blood     Status: None   Collection Time: 01/10/21 10:18 PM  Result Value Ref Range   Lipase 28 11 - 51 U/L    Comment: Performed at [facility], [address], [place], [place] [address]    DG Chest Portable 1 View  Result Date: 01/10/2021 CLINICAL DATA:  Fever, lower back abscess EXAM: PORTABLE CHEST 1 VIEW COMPARISON:  11/13/2007 FINDINGS: Single frontal view of the chest demonstrates an unremarkable cardiac silhouette. No airspace disease, effusion, or pneumothorax. Postsurgical changes are seen at the thoracolumbar junction. No acute displaced fractures. IMPRESSION: 1. No acute intrathoracic process. Electronically Signed   By: Sharlet Salina M.D.   On: 01/10/2021 22:58    ROS: Per HPI Blood pressure (!) 156/79, pulse 94, temperature (!) 100.7 F (38.2 C), temperature source Oral, resp. rate (!) 21, height 5\' 7"  (1.702 m), weight 108 kg, SpO2 100 %.  Physical Exam: Patient is awake, A/O X 4, conversant, and in NAD. Speech is fluent and appropriate. MAEW. B/L EHL 4/5. Sensation is intact. Incision is well approximated with no drainage, erythema, or edema.    Assessment/Plan: 70 y.o. male who was brought to the ED due to worsening left flank pain and generalized weakness. He is s/p L4/5 and L5/S1  ALIF with left-sided L2/3 and L3/4 XLIF  with T10 through pelvis fixation. CT imaging at outside facility revealed 9.6 x 7.7 x 10.8 cm loculated fluid collection concerning for seroma versus abscess in the left flank and pelvis.  He was febrile in the emergency department and was given Tylenol just prior to transfer.  He was given Zosyn and vancomycin in the ED prior to transfer.  Medicine admit T spine and L spine MRI with and without contrast pending Maintain NPO status  Possible IR drainage or neurosurgery wound exploration. Will await MRI results Pain control  66, DNP, NP-C 01/11/2021, 12:09 AM   Addendum:  Patient seen and examined.  Agree with above.  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  The epidural abscesses is in the ventral space.  There is also fluid collection along the hardware is concerning for either hematoma (I believe that is unlikely given his surgery was approximately 9 months ago) versus abscess.  Recommend IR drainage for evaluation of fluid.  Pending results patient may need surgical debridement.  Continue broad-spectrum antibiotic coverage and aggressive medical management.  Planned IR drainage today.  We will continue to follow closely.   Thank you for allowing me to participate in this patient's care.  Please do not hesitate to call with questions or concerns.   01/13/2021, DO Neurosurgeon [facility] Cell: [phone]

## 2021-01-11 NOTE — Procedures (Signed)
  Procedure: US aspiration R paraspinal posterolat collection <44ml old blood, sent for GS, C&S EBL:   minimal Complications:  none immediate  See full dictation in [employer].  Thora Lance MD Main # [phone] Pager  [phone]

## 2021-01-11 NOTE — Progress Notes (Signed)
Pt has home CPAP at bedside and pt states he will place himself on when ready and RN will assist with oxygen.

## 2021-01-11 NOTE — Progress Notes (Signed)
Pt arrived to the floor via the stretcher in pain and unable to ambulate to the room. Pt oriented to staff, room and equipment. Cardiac monitoring initiated. The ED RN reported that patient had a negative COVID test the same day in [place], therefor , he did not have a new test in the [facility] ED.

## 2021-01-11 NOTE — ED Notes (Signed)
Pt transferred to MRI

## 2021-01-11 NOTE — Progress Notes (Addendum)
Pharmacy Antibiotic Note  Marco Kennedy is a 69 y.o. male admitted on 01/10/2021 with sepsis, suspected lumbar infection. Pharmacy has been consulted for Vancomycin and Zosyn dosing. CT lumbar spine obtained in the [facility] ED revealed moderate size abscess located near the pelvic hardware.  Pt received Zosyn 4.5gm 7/16 1115 and  1713 and Vanc 2.5gm IV  7/16 1415 at OSH.  Plan: Zosyn 3.375gm IV q8h Vancomycin 1500 mg IV q 24 hrs. Goal AUC 400-550. Expected AUC: 476, Vd coeff 0.5 SCr used: 0.99 Will f/u renal function, micro data, and pt's clinical condition Vanc levels prn F/u wt (pt weight 108kg previously, now 69kg?? - RN to [employer], think pt is more like 108kg so used that for Vanc dosing)   Height: 5\' 7"  (170.2 cm) Weight: 69.1 kg (152 lb 5.4 oz) IBW/kg (Calculated) : 66.1  Temp (24hrs), Avg:100 F (37.8 C), Min:99.1 F (37.3 C), Max:100.7 F (38.2 C)  Recent Labs  Lab 01/10/21 2218  WBC 15.1*  CREATININE 0.99  LATICACIDVEN 1.4    Estimated Creatinine Clearance: 64.9 mL/min (by C-G formula based on SCr of 0.99 mg/dL).    No Known Allergies  Antimicrobials this admission: 7/16 Zosyn >>  7/16 Vanc >>    Microbiology results: 7/16 BCx:   Thank you for allowing pharmacy to be a part of this patient's care.  8/16, PharmD, BCPS Please see amion for complete clinical pharmacist phone list 01/11/2021 5:13 AM

## 2021-01-11 NOTE — Progress Notes (Signed)
PROGRESS NOTE    Marco Kennedy  JXB:[id number] DOB: 10-26-1950 DOA: 01/10/2021 PCP: Maximiano Coss, MD   Brief Narrative:  Marco Kennedy is a 70 y.o. male with medical history significant for back surgery with pelvis fixation in November 2021, OSA on BiPAP, who presented to [facility] ED due to sudden onset progressively worsening left flank pain of 1 week duration.  Associated with nausea, vomiting, generalized weakness, subjective fevers and chills and sweats.  CT lumbar spine obtained in the [facility] ED revealed moderate size abscess located near spinal hardware.  Since his back surgery was done by Dr. Venetia Maxon, decision was made to transfer to [facility] for neurosurgical assessment.  Seen by neurosurgery who recommended hospitalist admission, they will see in consultation.  Assessment & Plan:   Active Problems:   Abscess  Sepsis secondary to moderate size abscess located near the pelvic hardware, POA. Status post back surgery/hardware insertion - NeuroSx and interventional IR following - appreciate insight and recs - Imaging shows "Ventral Epidural Abscess in the thoracic spine tracking from the T7 superior endplate inferiorly at least to T10-T11 - where abnormal signal in the disc is highly suspicious for infectious Discitis" - Pain well controlled -Possible aspiration pending further evaluation as above -Broad-spectrum antibiotics ongoing including Vanc/Zosyn   Intractable nausea and vomiting with elevated liver chemistries -Likely secondary to above sepsis  -RUQ - 10 mm gallbladder polyp seen - RUQ benign on exam; labs downtrending with treatment of sepsis   Pain induced hypertension  Improved with pain control   Hypokalemia Mild, repleted    Hyperglycemia No oral hypo-glycemic's seen on his list of home medication Lab Results  Component Value Date   HGBA1C 5.9 (H) 01/11/2021    OSA on BiPAP Resume BiPAP nightly  DVT prophylaxis: Subcu Lovenox  daily Code Status: Full code Family Communication: At bedside  Status is: inpt  Dispo: The patient is from: Home              Anticipated d/c is to: Home              Anticipated d/c date is: 72+ hours              Patient currently not medically stable for discharge  Consultants:  Neurosurgery, interventional radiology  Procedures:  Possible abscess aspiration  Antimicrobials:  Vancomycin, Zosyn 01/10/2021, ongoing  Subjective: No acute issues or events overnight, pain currently well controlled; denies nausea vomiting diarrhea constipation headache fevers or chills.  Objective: Vitals:   01/11/21 0215 01/11/21 0417 01/11/21 0452 01/11/21 0551  BP: (!) 154/75 (!) 159/69 (!) 186/79   Pulse: (!) 105 (!) 106 96   Resp: (!) Temp:  99.8 F (37.7 C) 99.1 F (37.3 C)   TempSrc:   Oral   SpO2: 96% 95% 93%   Weight:   69.1 kg 122.2 kg  Height:        Intake/Output Summary (Last 24 hours) at 01/11/2021 0722 Last data filed at 01/10/2021 2333 Gross per 24 hour  Intake 2000 ml  Output --  Net 2000 ml   Filed Weights   01/10/21 2226 01/11/21 0452 01/11/21 0551  Weight: 108 kg 69.1 kg 122.2 kg    Examination:  General exam: Appears calm and comfortable  Respiratory system: Clear to auscultation. Respiratory effort normal. Cardiovascular system: S1 & S2 heard, RRR. No JVD, murmurs, rubs, gallops or clicks. No pedal edema. Gastrointestinal system: Abdomen is nondistended, soft and nontender.  No organomegaly or masses felt. Normal bowel sounds heard. Central nervous system: Alert and oriented. No focal neurological deficits. Extremities: Symmetric 5 x 5 power. Skin: No rashes, lesions or ulcers Psychiatry: Judgement and insight appear normal. Mood & affect appropriate.   Data Reviewed: I have personally reviewed following labs and imaging studies  CBC: Recent Labs  Lab 01/10/21 2218 01/11/21 0500  WBC 15.1* 15.9*  NEUTROABS 12.7*  --   HGB 13.1 12.1*  HCT  41.2 36.8*  MCV 87.1 85.6  PLT 173 177   Basic Metabolic Panel: Recent Labs  Lab 01/10/21 2218 01/11/21 0500 01/11/21 0507  NA 136 135  --   K 3.4* 3.3*  --   CL 100 103  --   CO2 27 24  --   GLUCOSE 167* 163*  --   BUN 24* 20  --   CREATININE 0.99 0.94  --   CALCIUM 8.0* 7.7*  --   MG  --   --  1.9  PHOS  --   --  2.7   GFR: Estimated Creatinine Clearance: 91.5 mL/min (by C-G formula based on SCr of 0.94 mg/dL). Liver Function Tests: Recent Labs  Lab 01/10/21 2218 01/11/21 0500  AST 163* 122*  ALT 112* 103*  ALKPHOS 108 103  BILITOT 1.5* 1.9*  PROT 5.9* 5.5*  ALBUMIN 2.4* 2.3*   Recent Labs  Lab 01/10/21 2218  LIPASE 28   No results for input(s): AMMONIA in the last 168 hours. Coagulation Profile: No results for input(s): INR, PROTIME in the last 168 hours. Cardiac Enzymes: No results for input(s): CKTOTAL, CKMB, CKMBINDEX, TROPONINI in the last 168 hours. BNP (last 3 results) No results for input(s): PROBNP in the last 8760 hours. HbA1C: Recent Labs    01/11/21 0500  HGBA1C 5.9*   CBG: No results for input(s): GLUCAP in the last 168 hours. Lipid Profile: No results for input(s): CHOL, HDL, LDLCALC, TRIG, CHOLHDL, LDLDIRECT in the last 72 hours. Thyroid Function Tests: No results for input(s): TSH, T4TOTAL, FREET4, T3FREE, THYROIDAB in the last 72 hours. Anemia Panel: No results for input(s): VITAMINB12, FOLATE, FERRITIN, TIBC, IRON, RETICCTPCT in the last 72 hours. Sepsis Labs: Recent Labs  Lab 01/10/21 2218 01/11/21 0500  PROCALCITON  --  1.61  LATICACIDVEN 1.4  --     No results found for this or any previous visit (from the past 240 hour(s)).       Radiology Studies: MR THORACIC SPINE W WO CONTRAST  Result Date: 01/11/2021 CLINICAL DATA:  70 year old male with fever and "low back abscess". Prior spine surgery in 2021. Approximately 1 week of increasing left side back and flank pain. Found to have large 9.6 x 7.7 x 10.8 cm left flank  or pelvic loculated fluid collection at outside facility suspicious for seroma versus abscess EXAM: MRI CERVICAL, THORACIC AND LUMBAR SPINE WITHOUT AND WITH CONTRAST TECHNIQUE: Multiplanar and multiecho pulse sequences of the cervical spine, to include the craniocervical junction and cervicothoracic junction, and thoracic and lumbar spine, were obtained without and with intravenous contrast. CONTRAST:  10mL GADAVIST GADOBUTROL 1 MMOL/ML IV SOLN COMPARISON:  Scoliosis radiographs 08/14/2020 and earlier. FINDINGS: MRI THORACIC SPINE FINDINGS Limited cervical spine imaging: Straightening of cervical lordosis. Generalized decreased T1 bone marrow signal. Thoracic spine segmentation:  Normal on the comparison radiographs. Alignment:  Preserved thoracic kyphosis. Vertebrae: Mild generalized decreased T1 marrow signal in the thoracic spine, although remaining within normal limits from T9 and above. Superimposed T10 through lumbar posterior spinal hardware artifact.  No convincing T10 through T12 marrow edema on the thoracic images. Cord: Abnormal dural thickening and enhancement extending from the lower thoracic spine which is affected by hardware artifact cephalad through the T4 level. Beginning at T7 and continuing inferiorly is a ventral epidural fluid collection with rim enhancement measuring up to 6 mm in thickness (at T8 series 27, image 25) which effaces the ventral CSF space at many lower thoracic levels. This continues toward the thoracolumbar junction, although thecal sac detail is limited by hardware artifact at T11 and T12. No definite associated cord edema. Above T4 the thecal sac and dura appear normal. Conus medullaris is obscured. Paraspinal and other soft tissues: Muscle postoperative changes. Suggesting high protein content or blood. There is surrounding right thoracic erector spinae muscle edema, superimposed on chronic postoperative changes. Small to moderate layering bilateral pleural effusions are  visible in the chest. Disc levels: Disc spaces are relatively normal for age above T9. Beginning in the midthoracic spine and continuing inferiorly is moderate and ultimately severe bilateral posterior facet hypertrophy. Beginning at T8-T9 there is multifactorial mild spinal stenosis in conjunction with the ventral epidural abscess. Similar mild spinal stenosis at T9-T10. The T10-T11 disc is abnormally T2 and STIR hyperintense, although evaluation for enhancement is limited due to hardware artifact. Possible mild endplate erosions there. Mild to moderate spinal stenosis there in part due to mass effect from moderate to severe posterior element hypertrophy on the right (series 27, image 32). T11-T12: disc space loss with disc bulging and endplate spurring. Up to moderate multifactorial spinal stenosis there. T12-L1 level is slightly better depicted on the lumbar images. MRI LUMBAR SPINE FINDINGS Segmentation:  Normal as seen on the comparison radiographs. Alignment:  Preserved lumbar lordosis. Vertebrae: Diffuse hardware susceptibility artifact, with interbody implants in addition to the posterior spinal hardware from L2-L3 through L5-S1. No definite lumbar marrow edema. No convincing sacral marrow edema. Grossly intact SI joints. Conus medullaris and cauda equina: Conus is obscured by hardware artifact. Cauda equina roots so in the lower lumbar thecal sac appear normal. Hardware artifact limits evaluation for abnormal dural thickening and enhancement in the lumbar spine, but the dura below L3 appears normal. Paraspinal and other soft tissues: Posterior paraspinal muscle postoperative changes. A small roughly 3 cm collection along the right side posterior T12 and L1 hardware has intrinsic T1 and T2 hyperintensity as above. Otherwise there is thin midline subcutaneous fluid or granulation tissue which is not rim enhancing and fairly dark on T2. No psoas muscle abscess identified. Disc levels: T12-L1: This level  remains fairly obscured due to hardware artifact, but on series 30, image 7 there does appear to be at least mild spinal stenosis. Unclear whether the epidural collection extends to this level. L1-L2: Obscured on axial images but on sagittal series 30, image 8 there is no strong evidence of epidural abscess or spinal stenosis. L2-L3: Obscured on axial images. Sagittal images demonstrate interbody implant with no convincing epidural abscess. There does appear to be mild spinal stenosis. L3-L4: Sequelae of decompression and posterior, interbody fusion here. No definite spinal stenosis or epidural abscess. L4-L5: Decompression and fusion. Left side posterior element hypertrophy results in mild left L4 foraminal stenosis. No spinal stenosis or epidural abscess. L5-S1: Status post decompression and fusion. No stenosis or abscess. IMPRESSION: 1. Previous T10 through Sacral spinal fusion with abundant metal hardware artifact. 2. Positive for a Ventral Epidural Abscess in the thoracic spine tracking from the T7 superior endplate inferiorly at least to T10-T11 - where  abnormal signal in the disc is highly suspicious for Infectious Discitis. Associated meningeal thickening and enhancement extending from that level cephalad to T4. 3. 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 4. No definite spinal cord edema, although detail of the conus is obscured by hardware artifact. 5. Solitary 3 cm posterior paraspinal fluid collection about the right T12 and L1 spinal rod, although fluid signal more resembles hematoma than abscess. 6. Otherwise extensive postoperative signal changes to the thoracolumbar erector spinae muscles, with ano obvious intramuscular abscess. 7. No definite lumbar epidural abscess. Visible sacrum and SI joints also appear spared. Electronically Signed   By: Odessa Fleming M.D.   On: 01/11/2021 04:44   MR Lumbar Spine W  Wo Contrast  Result Date: 01/11/2021 CLINICAL DATA:  70 year old male with fever and "low back abscess". Prior spine surgery in 2021. Approximately 1 week of increasing left side back and flank pain. Found to have large 9.6 x 7.7 x 10.8 cm left flank or pelvic loculated fluid collection at outside facility suspicious for seroma versus abscess EXAM: MRI CERVICAL, THORACIC AND LUMBAR SPINE WITHOUT AND WITH CONTRAST TECHNIQUE: Multiplanar and multiecho pulse sequences of the cervical spine, to include the craniocervical junction and cervicothoracic junction, and thoracic and lumbar spine, were obtained without and with intravenous contrast. CONTRAST:  63mL GADAVIST GADOBUTROL 1 MMOL/ML IV SOLN COMPARISON:  Scoliosis radiographs 08/14/2020 and earlier. FINDINGS: MRI THORACIC SPINE FINDINGS Limited cervical spine imaging: Straightening of cervical lordosis. Generalized decreased T1 bone marrow signal. Thoracic spine segmentation:  Normal on the comparison radiographs. Alignment:  Preserved thoracic kyphosis. Vertebrae: Mild generalized decreased T1 marrow signal in the thoracic spine, although remaining within normal limits from T9 and above. Superimposed T10 through lumbar posterior spinal hardware artifact. No convincing T10 through T12 marrow edema on the thoracic images. Cord: Abnormal dural thickening and enhancement extending from the lower thoracic spine which is affected by hardware artifact cephalad through the T4 level. Beginning at T7 and continuing inferiorly is a ventral epidural fluid collection with rim enhancement measuring up to 6 mm in thickness (at T8 series 27, image 25) which effaces the ventral CSF space at many lower thoracic levels. This continues toward the thoracolumbar junction, although thecal sac detail is limited by hardware artifact at T11 and T12. No definite associated cord edema. Above T4 the thecal sac and dura appear normal. Conus medullaris is obscured. Paraspinal and other soft  tissues: Muscle postoperative changes. Suggesting high protein content or blood. There is surrounding right thoracic erector spinae muscle edema, superimposed on chronic postoperative changes. Small to moderate layering bilateral pleural effusions are visible in the chest. Disc levels: Disc spaces are relatively normal for age above T9. Beginning in the midthoracic spine and continuing inferiorly is moderate and ultimately severe bilateral posterior facet hypertrophy. Beginning at T8-T9 there is multifactorial mild spinal stenosis in conjunction with the ventral epidural abscess. Similar mild spinal stenosis at T9-T10. The T10-T11 disc is abnormally T2 and STIR hyperintense, although evaluation for enhancement is limited due to hardware artifact. Possible mild endplate erosions there. Mild to moderate spinal stenosis there in part due to mass effect from moderate to severe posterior element hypertrophy on the right (series 27, image 32). T11-T12: disc space loss with disc bulging and endplate spurring. Up to moderate multifactorial spinal stenosis there. T12-L1 level is slightly better depicted on the lumbar images. MRI LUMBAR SPINE FINDINGS Segmentation:  Normal as seen on  the comparison radiographs. Alignment:  Preserved lumbar lordosis. Vertebrae: Diffuse hardware susceptibility artifact, with interbody implants in addition to the posterior spinal hardware from L2-L3 through L5-S1. No definite lumbar marrow edema. No convincing sacral marrow edema. Grossly intact SI joints. Conus medullaris and cauda equina: Conus is obscured by hardware artifact. Cauda equina roots so in the lower lumbar thecal sac appear normal. Hardware artifact limits evaluation for abnormal dural thickening and enhancement in the lumbar spine, but the dura below L3 appears normal. Paraspinal and other soft tissues: Posterior paraspinal muscle postoperative changes. A small roughly 3 cm collection along the right side posterior T12 and L1  hardware has intrinsic T1 and T2 hyperintensity as above. Otherwise there is thin midline subcutaneous fluid or granulation tissue which is not rim enhancing and fairly dark on T2. No psoas muscle abscess identified. Disc levels: T12-L1: This level remains fairly obscured due to hardware artifact, but on series 30, image 7 there does appear to be at least mild spinal stenosis. Unclear whether the epidural collection extends to this level. L1-L2: Obscured on axial images but on sagittal series 30, image 8 there is no strong evidence of epidural abscess or spinal stenosis. L2-L3: Obscured on axial images. Sagittal images demonstrate interbody implant with no convincing epidural abscess. There does appear to be mild spinal stenosis. L3-L4: Sequelae of decompression and posterior, interbody fusion here. No definite spinal stenosis or epidural abscess. L4-L5: Decompression and fusion. Left side posterior element hypertrophy results in mild left L4 foraminal stenosis. No spinal stenosis or epidural abscess. L5-S1: Status post decompression and fusion. No stenosis or abscess. IMPRESSION: 1. Previous T10 through Sacral spinal fusion with abundant metal hardware artifact. 2. Positive for a Ventral Epidural Abscess in the thoracic spine tracking from the T7 superior endplate inferiorly at least to T10-T11 - where abnormal signal in the disc is highly suspicious for Infectious Discitis. Associated meningeal thickening and enhancement extending from that level cephalad to T4. 3. 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 4. No definite spinal cord edema, although detail of the conus is obscured by hardware artifact. 5. Solitary 3 cm posterior paraspinal fluid collection about the right T12 and L1 spinal rod, although fluid signal more resembles hematoma than abscess. 6. Otherwise extensive postoperative signal changes to the  thoracolumbar erector spinae muscles, with ano obvious intramuscular abscess. 7. No definite lumbar epidural abscess. Visible sacrum and SI joints also appear spared. Electronically Signed   By: Odessa Fleming M.D.   On: 01/11/2021 04:44   DG Chest Portable 1 View  Result Date: 01/10/2021 CLINICAL DATA:  Fever, lower back abscess EXAM: PORTABLE CHEST 1 VIEW COMPARISON:  11/13/2007 FINDINGS: Single frontal view of the chest demonstrates an unremarkable cardiac silhouette. No airspace disease, effusion, or pneumothorax. Postsurgical changes are seen at the thoracolumbar junction. No acute displaced fractures. IMPRESSION: 1. No acute intrathoracic process. Electronically Signed   By: Sharlet Salina M.D.   On: 01/10/2021 22:58   US Abdomen Limited RUQ (LIVER/GB)  Result Date: 01/11/2021 CLINICAL DATA:  Elevated transaminase EXAM: ULTRASOUND ABDOMEN LIMITED RIGHT UPPER QUADRANT COMPARISON:  None. FINDINGS: Gallbladder: 10 mm gallbladder polyp, although without vascularity. No gallstones or gallbladder wall thickening. Negative sonographic Murphy's sign. Common bile duct: Diameter: 4 mm Liver: Hyperechoic hepatic parenchyma. No focal hepatic lesion is seen. Portal vein is patent on color Doppler imaging with normal direction of blood flow towards the liver. Other: None. IMPRESSION: Hyperechoic  hepatic parenchyma, suggesting hepatic steatosis. 10 mm gallbladder polyp. Given size, outpatient surgical consultation is suggested for consideration of elective cholecystectomy. If a conservative approach is desired, this is amenable to annual follow-up ultrasound. Electronically Signed   By: Charline Bills M.D.   On: 01/11/2021 01:57     Scheduled Meds:  enoxaparin (LOVENOX) injection  55 mg Subcutaneous Q24H   insulin aspart  0-15 Units Subcutaneous Q4H   Continuous Infusions:  lactated ringers 50 mL/hr at 01/11/21 0658   piperacillin-tazobactam (ZOSYN)  IV 3.375 g (01/11/21 4665)   vancomycin       LOS: 0 days    Time spent:  Azucena Fallen, DO [facility]  If 7PM-7AM, please contact night-coverage [email or web address]  01/11/2021, 7:22 AM

## 2021-01-11 NOTE — ED Notes (Signed)
Report attempted, CN states bed still not approved

## 2021-01-11 NOTE — Progress Notes (Signed)
[name], male   DOB: Feb 05, 1951, 70 y.o.   MRN: [id number] Request received from hospitalist team as well as neurosurgery for image guided aspiration of a right paraspinal fluid collection today.  Imaging studies have been reviewed by Dr. Deanne Coffer.  Details/risks of procedure, including but not limited to, internal bleeding, infection, injury to adjacent structures discussed with patient with his understanding and consent.  Procedure will be done with local anesthesia only.

## 2021-01-11 NOTE — Progress Notes (Signed)
Patient's enoxaparin dose was adjusted based on an updated weight. Original enoxaparin dose was 55 mg SQ. Given ease of administration, patients weight of 122.2 kg, and BMI >30, enoxaparin was adjusted to 0.5 mg/kg for 60 mg SQ. CrCl 91.5 (Scr 0.94).   Please feel free to reach out with any questions or concerns. Thank you.   Rushie Goltz, Pharm.D. PGY-1 Ambulatory Care Resident Phone:[phone] 01/11/2021 10:39 AM

## 2021-01-11 NOTE — H&P (Addendum)
History and Physical  Marco Kennedy ATF:[id number] DOB: Dec 29, 1950 DOA: 01/10/2021  Referring physician: Lilian Kapur, PA-EDP. PCP: Maximiano Coss, MD  Outpatient Specialists: Neurosurgery. Patient coming from: Home, via [facility] ED.  ED to ED transfer from [facility] ED to [facility] ED. Chief Complaint: Severe left flank pain, subjective fever, chills.  HPI: Marco Kennedy is a 71 y.o. male with medical history significant for back surgery with pelvis fixation in November 2021, OSA on BiPAP, who presented to [facility] ED due to sudden onset progressively worsening left flank pain of 1 week duration.  Associated with nausea, vomiting, generalized weakness, subjective fevers and chills and sweats.  CT lumbar spine obtained in the [facility] ED revealed moderate size abscess located near the pelvic hardware.  Since his back surgery was done by Dr. Venetia Maxon, decision was made to transfer to [facility] for neurosurgical assessment.  Seen by neurosurgery who recommended hospitalist admission, they will see in consultation.  ED Course: Temperature 100.7.  BP 186/79, pulse 96, respiration rate 24, O2 saturation 93% on 3 L.  Lab studies remarkable for serum sodium 136, potassium 3.4, serum bicarb 27, glucose 167, BUN 24, creatinine 0.99, GFR greater than 60, alkaline phosphatase 108, AST 163, ALT 112, total bilirubin 1.5.  Review of Systems: Review of systems as noted in the HPI. All other systems reviewed and are negative.   Past Medical History:  Diagnosis Date   Arthritis    arthritis-bilateral knees, left Hip.   Degenerative lumbar spinal stenosis    EKG abnormality    06-08-16- being evaluated by cardiologist  in [place]   Idiopathic scoliosis of lumbar region    Joint stiffness    Knee pain    Leg swelling    Low back pain    Achy, shooting pain.  Hurts to Walk or stand up straight.   Lumbar radiculopathy    Scoliosis concern    Sleep apnea    Bipap use nightly 25/19 -3l/m oxygen    Spondylolisthesis of lumbar region    Transfusion history    [place] ,[place] after bleeding post colon polyp removal and colonscopy procedure   Past Surgical History:  Procedure Laterality Date   ABDOMINAL EXPOSURE N/A 04/18/2020   Procedure: ABDOMINAL EXPOSURE;  Surgeon: [name], MD;  Location: [facility] OR;  Service: Vascular;  Laterality: N/A;   ANTERIOR LAT LUMBAR FUSION Left 04/18/2020   Procedure: ANTERIOR LATERAL LUMBAR FUSION LUMBAR TWO-THREE, LUMBAR THREE-FOUR;  Surgeon: [name], MD;  Location: [facility] OR;  Service: Neurosurgery;  Laterality: Left;   ANTERIOR LUMBAR FUSION N/A 04/18/2020   Procedure: Lumbar Four-Five Lumbar Five- Sacral One Anterior lumbar Interbody Fusion;  Surgeon: [name], MD;  Location: [facility] OR;  Service: Neurosurgery;  Laterality: N/A;   APPLICATION OF INTRAOPERATIVE CT SCAN N/A 04/21/2020   Procedure: APPLICATION OF INTRAOPERATIVE CT SCAN;  Surgeon: [name], MD;  Location: [facility] OR;  Service: Neurosurgery;  Laterality: N/A;   BACK SURGERY     lumbar disc surgery   COLONOSCOPY W/ POLYPECTOMY     HERNIA REPAIR Left    left inguinal hernia repair   KNEE ARTHROSCOPY Right 2003    -scope for meniscus   POSTERIOR LUMBAR FUSION 4 LEVEL N/A 04/21/2020   Procedure: Thoracic ten to pelvis pedicle screw fixation with osteotomies;  Surgeon: [name], MD;  Location: [facility] OR;  Service: Neurosurgery;  Laterality: N/A;   TOTAL HIP ARTHROPLASTY Left 06/15/2016   Procedure: LEFT TOTAL HIP ARTHROPLASTY ANTERIOR APPROACH;  Surgeon: [name],  MD;  Location: [facility] ORS;  Service: Orthopedics;  Laterality: Left;    Social History:  reports that he has never smoked. He has never used smokeless tobacco. He reports current alcohol use. He reports that he does not use drugs.   No Known Allergies  Family History  Problem Relation Age of Onset   Hypertension Mother    Arthritis Mother    Stomach cancer Father    Hypertension Brother       Prior to Admission  medications   Medication Sig Start Date End Date Taking? Authorizing Provider  acetaminophen (TYLENOL) 500 MG tablet Take 1,000 mg by mouth every 6 (six) hours as needed for moderate pain.     [provider]  Ascorbic Acid (VITAMIN C) 100 MG tablet Take 100 mg by mouth daily.    [provider]  cholecalciferol (VITAMIN D3) 25 MCG (1000 UNIT) tablet Take 1,000 Units by mouth daily.    [provider]  docusate sodium (COLACE) 100 MG capsule Take 1 capsule (100 mg total) by mouth 2 (two) times daily. Patient not taking: Reported on 04/03/2020 06/16/16   [name], PA-C  ferrous sulfate 325 (65 FE) MG tablet Take 1 tablet (325 mg total) by mouth 3 (three) times daily after meals. Patient not taking: Reported on 04/03/2020 06/16/16   [name], PA-C  Glucosamine-Chondroitin (OSTEO BI-FLEX REGULAR STRENGTH PO) Take 2 capsules by mouth daily. Patient not taking: Reported on 12/25/2019    [provider]  HYDROcodone-acetaminophen (NORCO/VICODIN) 5-325 MG tablet Take 1-2 tablets by mouth every 4 (four) hours as needed for severe pain ((score 7 to 10)). 05/07/20   [name], MD  methocarbamol (ROBAXIN) 500 MG tablet Take 1 tablet (500 mg total) by mouth every 6 (six) hours as needed for muscle spasms. Patient not taking: Reported on 12/25/2019 06/16/16   [name], PA-C  methocarbamol (ROBAXIN) 500 MG tablet Take 1 tablet (500 mg total) by mouth every 6 (six) hours as needed for muscle spasms. 05/07/20   [name], MD  Multiple Vitamin (MULTIVITAMIN WITH MINERALS) TABS tablet Take 1 tablet by mouth daily.    [provider]  polyethylene glycol (MIRALAX / GLYCOLAX) packet Take 17 g by mouth 2 (two) times daily. Patient not taking: Reported on 12/25/2019 06/16/16   [name], PA-C    Physical Exam: BP (!) 159/69   Pulse (!) 106   Temp 99.8 F (37.7 C)   Resp 20   Ht 5\' 7"  (1.702 m)   Wt 108 kg   SpO2 95%   BMI 37.29 kg/m    General: 70 y.o. year-old male well developed well nourished in no acute distress.  Alert and oriented x3. Cardiovascular: Regular rate and rhythm with no rubs or gallops.  No thyromegaly or JVD noted.  Trace lower extremity edema bilaterally. Respiratory: Clear to auscultation with no wheezes or rales. Good inspiratory effort. Abdomen: Soft nontender nondistended with normal bowel sounds x4 quadrants. Muskuloskeletal: No cyanosis or clubbing.  Trace lower extremity edema bilaterally. Neuro: CN II-XII intact, strength, sensation, reflexes Skin: No ulcerative lesions noted or rashes Psychiatry: Judgement and insight appear normal. Mood is appropriate for condition and setting          Labs on Admission:  Basic Metabolic Panel: Recent Labs  Lab 01/10/21 2218  NA 136  K 3.4*  CL 100  CO2 27  GLUCOSE 167*  BUN 24*  CREATININE 0.99  CALCIUM 8.0*   Liver Function Tests: Recent Labs  Lab 01/10/21 2218  AST 163*  ALT 112*  ALKPHOS 108  BILITOT 1.5*  PROT 5.9*  ALBUMIN 2.4*   Recent Labs  Lab 01/10/21 2218  LIPASE 28   No results for input(s): AMMONIA in the last 168 hours. CBC: Recent Labs  Lab 01/10/21 2218  WBC 15.1*  NEUTROABS 12.7*  HGB 13.1  HCT 41.2  MCV 87.1  PLT 173   Cardiac Enzymes: No results for input(s): CKTOTAL, CKMB, CKMBINDEX, TROPONINI in the last 168 hours.  BNP (last 3 results) No results for input(s): BNP in the last 8760 hours.  ProBNP (last 3 results) No results for input(s): PROBNP in the last 8760 hours.  CBG: No results for input(s): GLUCAP in the last 168 hours.  Radiological Exams on Admission: MR THORACIC SPINE W WO CONTRAST  Result Date: 01/11/2021 CLINICAL DATA:  70 year old male with fever and "low back abscess". Prior spine surgery in 2021. Approximately 1 week of increasing left side back and flank pain. Found to have large 9.6 x 7.7 x 10.8 cm left flank or pelvic loculated fluid collection at outside facility suspicious  for seroma versus abscess EXAM: MRI CERVICAL, THORACIC AND LUMBAR SPINE WITHOUT AND WITH CONTRAST TECHNIQUE: Multiplanar and multiecho pulse sequences of the cervical spine, to include the craniocervical junction and cervicothoracic junction, and thoracic and lumbar spine, were obtained without and with intravenous contrast. CONTRAST:  10mL GADAVIST GADOBUTROL 1 MMOL/ML IV SOLN COMPARISON:  Scoliosis radiographs 08/14/2020 and earlier. FINDINGS: MRI THORACIC SPINE FINDINGS Limited cervical spine imaging: Straightening of cervical lordosis. Generalized decreased T1 bone marrow signal. Thoracic spine segmentation:  Normal on the comparison radiographs. Alignment:  Preserved thoracic kyphosis. Vertebrae: Mild generalized decreased T1 marrow signal in the thoracic spine, although remaining within normal limits from T9 and above. Superimposed T10 through lumbar posterior spinal hardware artifact. No convincing T10 through T12 marrow edema on the thoracic images. Cord: Abnormal dural thickening and enhancement extending from the lower thoracic spine which is affected by hardware artifact cephalad through the T4 level. Beginning at T7 and continuing inferiorly is a ventral epidural fluid collection with rim enhancement measuring up to 6 mm in thickness (at T8 series 27, image 25) which effaces the ventral CSF space at many lower thoracic levels. This continues toward the thoracolumbar junction, although thecal sac detail is limited by hardware artifact at T11 and T12. No definite associated cord edema. Above T4 the thecal sac and dura appear normal. Conus medullaris is obscured. Paraspinal and other soft tissues: Muscle postoperative changes. Suggesting high protein content or blood. There is surrounding right thoracic erector spinae muscle edema, superimposed on chronic postoperative changes. Small to moderate layering bilateral pleural effusions are visible in the chest. Disc levels: Disc spaces are relatively normal for  age above T9. Beginning in the midthoracic spine and continuing inferiorly is moderate and ultimately severe bilateral posterior facet hypertrophy. Beginning at T8-T9 there is multifactorial mild spinal stenosis in conjunction with the ventral epidural abscess. Similar mild spinal stenosis at T9-T10. The T10-T11 disc is abnormally T2 and STIR hyperintense, although evaluation for enhancement is limited due to hardware artifact. Possible mild endplate erosions there. Mild to moderate spinal stenosis there in part due to mass effect from moderate to severe posterior element hypertrophy on the right (series 27, image 32). T11-T12: disc space loss with disc bulging and endplate spurring. Up to moderate multifactorial spinal stenosis there. T12-L1 level is slightly better depicted on the lumbar images. MRI LUMBAR SPINE FINDINGS Segmentation:  Normal as  seen on the comparison radiographs. Alignment:  Preserved lumbar lordosis. Vertebrae: Diffuse hardware susceptibility artifact, with interbody implants in addition to the posterior spinal hardware from L2-L3 through L5-S1. No definite lumbar marrow edema. No convincing sacral marrow edema. Grossly intact SI joints. Conus medullaris and cauda equina: Conus is obscured by hardware artifact. Cauda equina roots so in the lower lumbar thecal sac appear normal. Hardware artifact limits evaluation for abnormal dural thickening and enhancement in the lumbar spine, but the dura below L3 appears normal. Paraspinal and other soft tissues: Posterior paraspinal muscle postoperative changes. A small roughly 3 cm collection along the right side posterior T12 and L1 hardware has intrinsic T1 and T2 hyperintensity as above. Otherwise there is thin midline subcutaneous fluid or granulation tissue which is not rim enhancing and fairly dark on T2. No psoas muscle abscess identified. Disc levels: T12-L1: This level remains fairly obscured due to hardware artifact, but on series 30, image 7  there does appear to be at least mild spinal stenosis. Unclear whether the epidural collection extends to this level. L1-L2: Obscured on axial images but on sagittal series 30, image 8 there is no strong evidence of epidural abscess or spinal stenosis. L2-L3: Obscured on axial images. Sagittal images demonstrate interbody implant with no convincing epidural abscess. There does appear to be mild spinal stenosis. L3-L4: Sequelae of decompression and posterior, interbody fusion here. No definite spinal stenosis or epidural abscess. L4-L5: Decompression and fusion. Left side posterior element hypertrophy results in mild left L4 foraminal stenosis. No spinal stenosis or epidural abscess. L5-S1: Status post decompression and fusion. No stenosis or abscess. IMPRESSION: 1. Previous T10 through Sacral spinal fusion with abundant metal hardware artifact. 2. Positive for a Ventral Epidural Abscess in the thoracic spine tracking from the T7 superior endplate inferiorly at least to T10-T11 - where abnormal signal in the disc is highly suspicious for Infectious Discitis. Associated meningeal thickening and enhancement extending from that level cephalad to T4. 3. 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 4. No definite spinal cord edema, although detail of the conus is obscured by hardware artifact. 5. Solitary 3 cm posterior paraspinal fluid collection about the right T12 and L1 spinal rod, although fluid signal more resembles hematoma than abscess. 6. Otherwise extensive postoperative signal changes to the thoracolumbar erector spinae muscles, with ano obvious intramuscular abscess. 7. No definite lumbar epidural abscess. Visible sacrum and SI joints also appear spared. Electronically Signed   By: Odessa Fleming M.D.   On: 01/11/2021 04:44   MR Lumbar Spine W Wo Contrast  Result Date: 01/11/2021 CLINICAL DATA:  70 year old male with  fever and "low back abscess". Prior spine surgery in 2021. Approximately 1 week of increasing left side back and flank pain. Found to have large 9.6 x 7.7 x 10.8 cm left flank or pelvic loculated fluid collection at outside facility suspicious for seroma versus abscess EXAM: MRI CERVICAL, THORACIC AND LUMBAR SPINE WITHOUT AND WITH CONTRAST TECHNIQUE: Multiplanar and multiecho pulse sequences of the cervical spine, to include the craniocervical junction and cervicothoracic junction, and thoracic and lumbar spine, were obtained without and with intravenous contrast. CONTRAST:  10mL GADAVIST GADOBUTROL 1 MMOL/ML IV SOLN COMPARISON:  Scoliosis radiographs 08/14/2020 and earlier. FINDINGS: MRI THORACIC SPINE FINDINGS Limited cervical spine imaging: Straightening of cervical lordosis. Generalized decreased T1 bone marrow signal. Thoracic spine segmentation:  Normal on the comparison radiographs. Alignment:  Preserved thoracic kyphosis. Vertebrae: Mild  generalized decreased T1 marrow signal in the thoracic spine, although remaining within normal limits from T9 and above. Superimposed T10 through lumbar posterior spinal hardware artifact. No convincing T10 through T12 marrow edema on the thoracic images. Cord: Abnormal dural thickening and enhancement extending from the lower thoracic spine which is affected by hardware artifact cephalad through the T4 level. Beginning at T7 and continuing inferiorly is a ventral epidural fluid collection with rim enhancement measuring up to 6 mm in thickness (at T8 series 27, image 25) which effaces the ventral CSF space at many lower thoracic levels. This continues toward the thoracolumbar junction, although thecal sac detail is limited by hardware artifact at T11 and T12. No definite associated cord edema. Above T4 the thecal sac and dura appear normal. Conus medullaris is obscured. Paraspinal and other soft tissues: Muscle postoperative changes. Suggesting high protein content or blood.  There is surrounding right thoracic erector spinae muscle edema, superimposed on chronic postoperative changes. Small to moderate layering bilateral pleural effusions are visible in the chest. Disc levels: Disc spaces are relatively normal for age above T9. Beginning in the midthoracic spine and continuing inferiorly is moderate and ultimately severe bilateral posterior facet hypertrophy. Beginning at T8-T9 there is multifactorial mild spinal stenosis in conjunction with the ventral epidural abscess. Similar mild spinal stenosis at T9-T10. The T10-T11 disc is abnormally T2 and STIR hyperintense, although evaluation for enhancement is limited due to hardware artifact. Possible mild endplate erosions there. Mild to moderate spinal stenosis there in part due to mass effect from moderate to severe posterior element hypertrophy on the right (series 27, image 32). T11-T12: disc space loss with disc bulging and endplate spurring. Up to moderate multifactorial spinal stenosis there. T12-L1 level is slightly better depicted on the lumbar images. MRI LUMBAR SPINE FINDINGS Segmentation:  Normal as seen on the comparison radiographs. Alignment:  Preserved lumbar lordosis. Vertebrae: Diffuse hardware susceptibility artifact, with interbody implants in addition to the posterior spinal hardware from L2-L3 through L5-S1. No definite lumbar marrow edema. No convincing sacral marrow edema. Grossly intact SI joints. Conus medullaris and cauda equina: Conus is obscured by hardware artifact. Cauda equina roots so in the lower lumbar thecal sac appear normal. Hardware artifact limits evaluation for abnormal dural thickening and enhancement in the lumbar spine, but the dura below L3 appears normal. Paraspinal and other soft tissues: Posterior paraspinal muscle postoperative changes. A small roughly 3 cm collection along the right side posterior T12 and L1 hardware has intrinsic T1 and T2 hyperintensity as above. Otherwise there is thin  midline subcutaneous fluid or granulation tissue which is not rim enhancing and fairly dark on T2. No psoas muscle abscess identified. Disc levels: T12-L1: This level remains fairly obscured due to hardware artifact, but on series 30, image 7 there does appear to be at least mild spinal stenosis. Unclear whether the epidural collection extends to this level. L1-L2: Obscured on axial images but on sagittal series 30, image 8 there is no strong evidence of epidural abscess or spinal stenosis. L2-L3: Obscured on axial images. Sagittal images demonstrate interbody implant with no convincing epidural abscess. There does appear to be mild spinal stenosis. L3-L4: Sequelae of decompression and posterior, interbody fusion here. No definite spinal stenosis or epidural abscess. L4-L5: Decompression and fusion. Left side posterior element hypertrophy results in mild left L4 foraminal stenosis. No spinal stenosis or epidural abscess. L5-S1: Status post decompression and fusion. No stenosis or abscess. IMPRESSION: 1. Previous T10 through Sacral spinal fusion with abundant metal  hardware artifact. 2. Positive for a Ventral Epidural Abscess in the thoracic spine tracking from the T7 superior endplate inferiorly at least to T10-T11 - where abnormal signal in the disc is highly suspicious for Infectious Discitis. Associated meningeal thickening and enhancement extending from that level cephalad to T4. 3. 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 4. No definite spinal cord edema, although detail of the conus is obscured by hardware artifact. 5. Solitary 3 cm posterior paraspinal fluid collection about the right T12 and L1 spinal rod, although fluid signal more resembles hematoma than abscess. 6. Otherwise extensive postoperative signal changes to the thoracolumbar erector spinae muscles, with ano obvious intramuscular abscess. 7. No  definite lumbar epidural abscess. Visible sacrum and SI joints also appear spared. Electronically Signed   By: Odessa Fleming M.D.   On: 01/11/2021 04:44   DG Chest Portable 1 View  Result Date: 01/10/2021 CLINICAL DATA:  Fever, lower back abscess EXAM: PORTABLE CHEST 1 VIEW COMPARISON:  11/13/2007 FINDINGS: Single frontal view of the chest demonstrates an unremarkable cardiac silhouette. No airspace disease, effusion, or pneumothorax. Postsurgical changes are seen at the thoracolumbar junction. No acute displaced fractures. IMPRESSION: 1. No acute intrathoracic process. Electronically Signed   By: Sharlet Salina M.D.   On: 01/10/2021 22:58   US Abdomen Limited RUQ (LIVER/GB)  Result Date: 01/11/2021 CLINICAL DATA:  Elevated transaminase EXAM: ULTRASOUND ABDOMEN LIMITED RIGHT UPPER QUADRANT COMPARISON:  None. FINDINGS: Gallbladder: 10 mm gallbladder polyp, although without vascularity. No gallstones or gallbladder wall thickening. Negative sonographic Murphy's sign. Common bile duct: Diameter: 4 mm Liver: Hyperechoic hepatic parenchyma. No focal hepatic lesion is seen. Portal vein is patent on color Doppler imaging with normal direction of blood flow towards the liver. Other: None. IMPRESSION: Hyperechoic hepatic parenchyma, suggesting hepatic steatosis. 10 mm gallbladder polyp. Given size, outpatient surgical consultation is suggested for consideration of elective cholecystectomy. If a conservative approach is desired, this is amenable to annual follow-up ultrasound. Electronically Signed   By: Charline Bills M.D.   On: 01/11/2021 01:57    EKG: I independently viewed the EKG done and my findings are as followed: Sinus rhythm rate of 82, nonspecific ST-T changes.  QTc 495.  Assessment/Plan Present on Admission:  Abscess  Active Problems:   Abscess  Sepsis secondary to moderate size abscess located near the pelvic hardware, POA. Status post back surgery/ Presented with some onset progressively  worsening left flank pain of 1 week duration.  Associated with generalized weakness, subjective fever, chills and night sweats. Seen by neurosurgery Start IV vancomycin and IV Zosyn empirically Start IV fluid hydration LR at 50 cc/h x 2 days. IR consulted for possible drainage Follow T L-spine MRI with and without contrast Follow blood cultures x2 peripherally. N.p.o. Pain control and bowel regimen as needed  Intractable nausea and vomiting with elevated liver chemistries On right upper quadrant abdominal ultrasound 10 mm gallbladder polyp seen. Consult general surgery in the morning for further recommendations. IV antiemetics as needed  Elevated blood pressure Suspect contributed by pain No oral antihypertensives seen on his list of home meds. Treat pain IV Lopressor as needed with parameters  Hypokalemia Serum potassium 3.4 Repleted orally. Recheck in the morning Obtain magnesium level  Elevated liver chemistries AST, ALT, T bilirubin elevated Repeat CMP Judicious use of acetaminophen for fever. Closely monitor liver chemistries  Hyperglycemia No oral hypo-glycemic's seen on his list of home medication Obtain hemoglobin A1c  Insulin sliding scale every 4 hours while NPO.  OSA on BiPAP Resume BiPAP nightly   DVT prophylaxis: Subcu Lovenox daily  Code Status: Full code  Family Communication: None at bedside  Disposition Plan: Admit to progressive unit  Consults called: Neurosurgery  Admission status: Inpatient status.  Patient will require at least 2 midnights for further evaluation and treatment of present condition.   Status is: Inpatient    Dispo:  Patient From: Home  Planned Disposition: Home when neurosurgery signs off.  Medically stable for discharge: No         Darlin Droparole N Indalecio Malmstrom MD [facility] Pager [phone]  If 7PM-7AM, please contact night-coverage [email or web address] Password [email or web address]  01/11/2021, 4:54 AM

## 2021-01-11 NOTE — Consult Note (Addendum)
Reason for Consult: Pelvic abscess Referring Physician: Barkley Boards, PA-C  HPI: Marco Kennedy is a 70 y.o. male who is s/p 2 stage thoracoliumbar surgery. Stage once consisted of an ALIF at the L4-5 and L5-S1 levels with left-sided XLIF at L2-3 and L3-4 levels. The second stage consisted of T10 through pelvis fixation along with osteotomies. The patient tolerated the procedure well and was discharged on 05/07/2020 to [facility] in [place]. He was managing well and ambulating daily without the use of his cane. However, approximately 1 week ago, he began having left flank pain that continued to progressively worsen. On Monday he reports chills and "having the shakes." By Tuesday, he had generalized weakness and fell out of bed and required assistance from the fire department to help him off the floor. Wednesday, he began to feel slightly better, although this was short lived. By Friday, he was unable to tolerate the pain, weakness, and "flu like symptoms and decided to present to the ED in [place]. He was unable to be seen in [place] due to the ED census and ultimately sought evaluation at a freestanding ED in [place], [place]. He reports having 3 negative COVID tests.   Today, he continues to have generalized weakness, shakes, and feels "achy" all over. He is unable to resist gravity in his BLE and is unable to sit up in bed. His pain is manageable with laying in bed. However, his pain is significantly exacerbated up to a 8-9/10 with movement. A CT LS was obtained in the [place] ED, however, we have been unable to view it due to incompatibility issues between our EMR and image viewing software. The [place] EDP reported that there was a moderate size abscess located near the pelvic hardware. It was ultimately decided to transfer the patient to [facility] for further management.   Past Medical History:  Diagnosis Date   Arthritis    arthritis-bilateral knees, left  Hip.   Degenerative lumbar spinal stenosis    EKG abnormality    06-08-16- being evaluated by cardiologist  in [place]   Idiopathic scoliosis of lumbar region    Joint stiffness    Knee pain    Leg swelling    Low back pain    Achy, shooting pain.  Hurts to Walk or stand up straight.   Lumbar radiculopathy    Scoliosis concern    Sleep apnea    Bipap use nightly 25/19 -3l/m oxygen   Spondylolisthesis of lumbar region    Transfusion history    [place] ,[place] after bleeding post colon polyp removal and colonscopy procedure    Past Surgical History:  Procedure Laterality Date   ABDOMINAL EXPOSURE N/A 04/18/2020   Procedure: ABDOMINAL EXPOSURE;  Surgeon: [name], MD;  Location: [facility] OR;  Service: Vascular;  Laterality: N/A;   ANTERIOR LAT LUMBAR FUSION Left 04/18/2020   Procedure: ANTERIOR LATERAL LUMBAR FUSION LUMBAR TWO-THREE, LUMBAR THREE-FOUR;  Surgeon: [name], MD;  Location: [facility] OR;  Service: Neurosurgery;  Laterality: Left;   ANTERIOR LUMBAR FUSION N/A 04/18/2020   Procedure: Lumbar Four-Five Lumbar Five- Sacral One Anterior lumbar Interbody Fusion;  Surgeon: [name], MD;  Location: [facility] OR;  Service: Neurosurgery;  Laterality: N/A;   APPLICATION OF INTRAOPERATIVE CT SCAN N/A 04/21/2020   Procedure: APPLICATION OF INTRAOPERATIVE CT SCAN;  Surgeon: [name], MD;  Location: [facility] OR;  Service: Neurosurgery;  Laterality: N/A;   BACK SURGERY     lumbar disc surgery  COLONOSCOPY W/ POLYPECTOMY     HERNIA REPAIR Left    left inguinal hernia repair   KNEE ARTHROSCOPY Right 2003    -scope for meniscus   POSTERIOR LUMBAR FUSION 4 LEVEL N/A 04/21/2020   Procedure: Thoracic ten to pelvis pedicle screw fixation with osteotomies;  Surgeon: [name], MD;  Location: [facility] OR;  Service: Neurosurgery;  Laterality: N/A;   TOTAL HIP ARTHROPLASTY Left 06/15/2016   Procedure: LEFT TOTAL HIP ARTHROPLASTY ANTERIOR APPROACH;  Surgeon: [name], MD;  Location: [facility] ORS;   Service: Orthopedics;  Laterality: Left;    Family History  Problem Relation Age of Onset   Hypertension Mother    Arthritis Mother    Stomach cancer Father    Hypertension Brother     Social History:  reports that he has never smoked. He has never used smokeless tobacco. He reports current alcohol use. He reports that he does not use drugs.  Allergies: No Known Allergies  Medications: I have reviewed the patient's current medications.  Results for orders placed or performed during the hospital encounter of 01/10/21 (from the past 48 hour(s))  CBC with Differential     Status: Abnormal   Collection Time: 01/10/21 10:18 PM  Result Value Ref Range   WBC 15.1 (H) 4.0 - 10.5 K/uL   RBC 4.73 4.22 - 5.81 MIL/uL   Hemoglobin 13.1 13.0 - 17.0 g/dL   HCT 37.4 82.7 - 07.8 %   MCV 87.1 80.0 - 100.0 fL   MCH 27.7 26.0 - 34.0 pg   MCHC 31.8 30.0 - 36.0 g/dL   RDW 67.5 44.9 - 20.1 %   Platelets 173 150 - 400 K/uL   nRBC 0.0 0.0 - 0.2 %   Neutrophils Relative % 85 %   Neutro Abs 12.7 (H) 1.7 - 7.7 K/uL   Lymphocytes Relative 2 %   Lymphs Abs 0.4 (L) 0.7 - 4.0 K/uL   Monocytes Relative 9 %   Monocytes Absolute 1.3 (H) 0.1 - 1.0 K/uL   Eosinophils Relative 0 %   Eosinophils Absolute 0.0 0.0 - 0.5 K/uL   Basophils Relative 0 %   Basophils Absolute 0.0 0.0 - 0.1 K/uL   Immature Granulocytes 4 %   Abs Immature Granulocytes 0.60 (H) 0.00 - 0.07 K/uL    Comment: Performed at [facility], [address] [place], [place] [address]  Lactic acid, plasma     Status: None   Collection Time: 01/10/21 10:18 PM  Result Value Ref Range   Lactic Acid, Venous 1.4 0.5 - 1.9 mmol/L    Comment: Performed at [facility], [address] [place], [place] [address]  Comprehensive metabolic panel     Status: Abnormal   Collection Time: 01/10/21 10:18 PM  Result Value Ref Range   Sodium 136 135 - 145 mmol/L   Potassium 3.4 (L) 3.5 - 5.1 mmol/L   Chloride 100 98 - 111 mmol/L   CO2 27 22 -  32 mmol/L   Glucose, Bld 167 (H) 70 - 99 mg/dL    Comment: Glucose reference range applies only to samples taken after fasting for at least 8 hours.   BUN 24 (H) 8 - 23 mg/dL   Creatinine, Ser 8.32 0.61 - 1.24 mg/dL   Calcium 8.0 (L) 8.9 - 10.3 mg/dL   Total Protein 5.9 (L) 6.5 - 8.1 g/dL   Albumin 2.4 (L) 3.5 - 5.0 g/dL   AST 549 (H) 15 - 41 U/L   ALT  112 (H) 0 - 44 U/L   Alkaline Phosphatase 108 38 - 126 U/L   Total Bilirubin 1.5 (H) 0.3 - 1.2 mg/dL   GFR, Estimated >82 >50 mL/min    Comment: (NOTE) Calculated using the CKD-EPI Creatinine Equation (2021)    Anion gap 9 5 - 15    Comment: Performed at [facility], [address] [place], [place] [address]  Lipase, blood     Status: None   Collection Time: 01/10/21 10:18 PM  Result Value Ref Range   Lipase 28 11 - 51 U/L    Comment: Performed at [facility], [address], [place], [place] [address]    DG Chest Portable 1 View  Result Date: 01/10/2021 CLINICAL DATA:  Fever, lower back abscess EXAM: PORTABLE CHEST 1 VIEW COMPARISON:  11/13/2007 FINDINGS: Single frontal view of the chest demonstrates an unremarkable cardiac silhouette. No airspace disease, effusion, or pneumothorax. Postsurgical changes are seen at the thoracolumbar junction. No acute displaced fractures. IMPRESSION: 1. No acute intrathoracic process. Electronically Signed   By: Sharlet Salina M.D.   On: 01/10/2021 22:58    ROS: Per HPI Blood pressure (!) 156/79, pulse 94, temperature (!) 100.7 F (38.2 C), temperature source Oral, resp. rate (!) 21, height 5\' 7"  (1.702 m), weight 108 kg, SpO2 100 %.  Physical Exam: Patient is awake, A/O X 4, conversant, and in NAD. Speech is fluent and appropriate. MAEW. B/L EHL 4/5. Sensation is intact. Incision is well approximated with no drainage, erythema, or edema.    Assessment/Plan: 70 y.o. male who was brought to the ED due to worsening left flank pain and generalized weakness. He is s/p L4/5 and L5/S1  ALIF with left-sided L2/3 and L3/4 XLIF  with T10 through pelvis fixation. CT imaging at outside facility revealed 9.6 x 7.7 x 10.8 cm loculated fluid collection concerning for seroma versus abscess in the left flank and pelvis.  He was febrile in the emergency department and was given Tylenol just prior to transfer.  He was given Zosyn and vancomycin in the ED prior to transfer.  Medicine admit T spine and L spine MRI with and without contrast pending Maintain NPO status  Possible IR drainage or neurosurgery wound exploration. Will await MRI results Pain control  66, DNP, NP-C 01/11/2021, 12:09 AM   Addendum:  Patient seen and examined.  Agree with above.  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  The epidural abscesses is in the ventral space.  There is also fluid collection along the hardware is concerning for either hematoma (I believe that is unlikely given his surgery was approximately 9 months ago) versus abscess.  Recommend IR drainage for evaluation of fluid.  Pending results patient may need surgical debridement.  Continue broad-spectrum antibiotic coverage and aggressive medical management.  Planned IR drainage today.  We will continue to follow closely.   Thank you for allowing me to participate in this patient's care.  Please do not hesitate to call with questions or concerns.   01/13/2021, DO Neurosurgeon [facility] Cell: [phone]

## 2021-01-11 NOTE — ED Notes (Signed)
Report given to the unit to [facility], RN and Pt to be transfer when coming back from MRI.

## 2021-01-12 LAB — COMPREHENSIVE METABOLIC PANEL
ALT: 78 U/L — ABNORMAL HIGH (ref 0–44)
AST: 90 U/L — ABNORMAL HIGH (ref 15–41)
Albumin: 2 g/dL — ABNORMAL LOW (ref 3.5–5.0)
Alkaline Phosphatase: 98 U/L (ref 38–126)
Anion gap: 9 (ref 5–15)
BUN: 20 mg/dL (ref 8–23)
CO2: 24 mmol/L (ref 22–32)
Calcium: 7.7 mg/dL — ABNORMAL LOW (ref 8.9–10.3)
Chloride: 101 mmol/L (ref 98–111)
Creatinine, Ser: 0.98 mg/dL (ref 0.61–1.24)
GFR, Estimated: >60 mL/min (ref >60)
Glucose, Bld: 109 mg/dL — ABNORMAL HIGH (ref 70–99)
Potassium: 3.9 mmol/L (ref 3.5–5.1)
Sodium: 134 mmol/L — ABNORMAL LOW (ref 135–145)
Total Bilirubin: 1.7 mg/dL — ABNORMAL HIGH (ref 0.3–1.2)
Total Protein: 5.1 g/dL — ABNORMAL LOW (ref 6.5–8.1)

## 2021-01-12 LAB — GLUCOSE, CAPILLARY
Glucose-Capillary: 121 mg/dL — ABNORMAL HIGH (ref 70–99)
Glucose-Capillary: 106 mg/dL — ABNORMAL HIGH (ref 70–99)
Glucose-Capillary: 123 mg/dL — ABNORMAL HIGH (ref 70–99)
Glucose-Capillary: 122 mg/dL — ABNORMAL HIGH (ref 70–99)
Glucose-Capillary: 157 mg/dL — ABNORMAL HIGH (ref 70–99)

## 2021-01-12 LAB — CBC
HCT: 34.9 % — ABNORMAL LOW (ref 39.0–52.0)
Hemoglobin: 11.2 g/dL — ABNORMAL LOW (ref 13.0–17.0)
MCH: 28.1 pg (ref 26.0–34.0)
MCHC: 32.1 g/dL (ref 30.0–36.0)
MCV: 87.5 fL (ref 80.0–100.0)
Platelets: 213 10*3/uL (ref 150–400)
RBC: 3.99 MIL/uL — ABNORMAL LOW (ref 4.22–5.81)
RDW: 15.2 % (ref 11.5–15.5)
WBC: 17.8 10*3/uL — ABNORMAL HIGH (ref 4.0–10.5)
nRBC: 0 % (ref 0.0–0.2)

## 2021-01-12 LAB — PROCALCITONIN: Procalcitonin: 2.09 ng/mL

## 2021-01-12 MED ORDER — POLYETHYLENE GLYCOL 3350 17 G PO PACK: 17.0000 g | PACK | Freq: Every day | ORAL | Status: DC

## 2021-01-12 MED ORDER — POLYETHYLENE GLYCOL 3350 17 G PO PACK
17.0000 g | PACK | Freq: Two times a day (BID) | ORAL | Status: DC
  Administered 2021-01-12 – 2021-01-13 (×3): 17 g via ORAL
  Filled 2021-01-12 (×5): qty 1

## 2021-01-12 MED ORDER — SENNOSIDES-DOCUSATE SODIUM 8.6-50 MG PO TABS
1.0000 | ORAL_TABLET | Freq: Two times a day (BID) | ORAL | Status: DC
  Administered 2021-01-12 – 2021-01-13 (×3): 1 via ORAL
  Filled 2021-01-12 (×5): qty 1

## 2021-01-12 NOTE — Progress Notes (Signed)
Pt will self administer his home CPAP. 

## 2021-01-12 NOTE — Progress Notes (Signed)
Subjective: Patient reports that he is doing well. He continues to have some lumbosacral pain. No acute events overnight.   Objective: Vital signs in last 24 hours: Temp:  [98 F (36.7 C)-99.1 F (37.3 C)] 99.1 F (37.3 C) (07/18 0750) Pulse Rate:  [83-100] 83 (07/18 0750) Resp:  [16-20] 20 (07/18 0750) BP: (139-164)/(57-92) 152/66 (07/18 0750) SpO2:  [92 %-98 %] 92 % (07/18 0750)  Intake/Output from previous day: 07/17 0701 - 07/18 0700 In: -  Out: 950 [Urine:950] Intake/Output this shift: Total I/O In: -  Out: 400 [Urine:400]  Physical Exam: Patient is awake, A/O X 4, conversant, and in NAD. Speech is fluent and appropriate. MAEW. B/L EHL 4/5. Sensation is intact. Incision is well approximated with no drainage, erythema, or edema.  Lab Results: Recent Labs    01/11/21 0500 01/12/21 0436  WBC 15.9* 17.8*  HGB 12.1* 11.2*  HCT 36.8* 34.9*  PLT 177 213   BMET Recent Labs    01/11/21 0500 01/12/21 0436  NA 135 134*  K 3.3* 3.9  CL 103 101  CO2 24 24  GLUCOSE 163* 109*  BUN 20 20  CREATININE 0.94 0.98  CALCIUM 7.7* 7.7*    Studies/Results: MR THORACIC SPINE W WO CONTRAST  Result Date: 01/11/2021 CLINICAL DATA:  70 year old male with fever and "low back abscess". Prior spine surgery in 2021. Approximately 1 week of increasing left side back and flank pain. Found to have large 9.6 x 7.7 x 10.8 cm left flank or pelvic loculated fluid collection at outside facility suspicious for seroma versus abscess EXAM: MRI CERVICAL, THORACIC AND LUMBAR SPINE WITHOUT AND WITH CONTRAST TECHNIQUE: Multiplanar and multiecho pulse sequences of the cervical spine, to include the craniocervical junction and cervicothoracic junction, and thoracic and lumbar spine, were obtained without and with intravenous contrast. CONTRAST:  41mL GADAVIST GADOBUTROL 1 MMOL/ML IV SOLN COMPARISON:  Scoliosis radiographs 08/14/2020 and earlier. FINDINGS: MRI THORACIC SPINE FINDINGS Limited cervical spine  imaging: Straightening of cervical lordosis. Generalized decreased T1 bone marrow signal. Thoracic spine segmentation:  Normal on the comparison radiographs. Alignment:  Preserved thoracic kyphosis. Vertebrae: Mild generalized decreased T1 marrow signal in the thoracic spine, although remaining within normal limits from T9 and above. Superimposed T10 through lumbar posterior spinal hardware artifact. No convincing T10 through T12 marrow edema on the thoracic images. Cord: Abnormal dural thickening and enhancement extending from the lower thoracic spine which is affected by hardware artifact cephalad through the T4 level. Beginning at T7 and continuing inferiorly is a ventral epidural fluid collection with rim enhancement measuring up to 6 mm in thickness (at T8 series 27, image 25) which effaces the ventral CSF space at many lower thoracic levels. This continues toward the thoracolumbar junction, although thecal sac detail is limited by hardware artifact at T11 and T12. No definite associated cord edema. Above T4 the thecal sac and dura appear normal. Conus medullaris is obscured. Paraspinal and other soft tissues: Muscle postoperative changes. Suggesting high protein content or blood. There is surrounding right thoracic erector spinae muscle edema, superimposed on chronic postoperative changes. Small to moderate layering bilateral pleural effusions are visible in the chest. Disc levels: Disc spaces are relatively normal for age above T9. Beginning in the midthoracic spine and continuing inferiorly is moderate and ultimately severe bilateral posterior facet hypertrophy. Beginning at T8-T9 there is multifactorial mild spinal stenosis in conjunction with the ventral epidural abscess. Similar mild spinal stenosis at T9-T10. The T10-T11 disc is abnormally T2 and STIR hyperintense, although evaluation  for enhancement is limited due to hardware artifact. Possible mild endplate erosions there. Mild to moderate spinal  stenosis there in part due to mass effect from moderate to severe posterior element hypertrophy on the right (series 27, image 32). T11-T12: disc space loss with disc bulging and endplate spurring. Up to moderate multifactorial spinal stenosis there. T12-L1 level is slightly better depicted on the lumbar images. MRI LUMBAR SPINE FINDINGS Segmentation:  Normal as seen on the comparison radiographs. Alignment:  Preserved lumbar lordosis. Vertebrae: Diffuse hardware susceptibility artifact, with interbody implants in addition to the posterior spinal hardware from L2-L3 through L5-S1. No definite lumbar marrow edema. No convincing sacral marrow edema. Grossly intact SI joints. Conus medullaris and cauda equina: Conus is obscured by hardware artifact. Cauda equina roots so in the lower lumbar thecal sac appear normal. Hardware artifact limits evaluation for abnormal dural thickening and enhancement in the lumbar spine, but the dura below L3 appears normal. Paraspinal and other soft tissues: Posterior paraspinal muscle postoperative changes. A small roughly 3 cm collection along the right side posterior T12 and L1 hardware has intrinsic T1 and T2 hyperintensity as above. Otherwise there is thin midline subcutaneous fluid or granulation tissue which is not rim enhancing and fairly dark on T2. No psoas muscle abscess identified. Disc levels: T12-L1: This level remains fairly obscured due to hardware artifact, but on series 30, image 7 there does appear to be at least mild spinal stenosis. Unclear whether the epidural collection extends to this level. L1-L2: Obscured on axial images but on sagittal series 30, image 8 there is no strong evidence of epidural abscess or spinal stenosis. L2-L3: Obscured on axial images. Sagittal images demonstrate interbody implant with no convincing epidural abscess. There does appear to be mild spinal stenosis. L3-L4: Sequelae of decompression and posterior, interbody fusion here. No definite  spinal stenosis or epidural abscess. L4-L5: Decompression and fusion. Left side posterior element hypertrophy results in mild left L4 foraminal stenosis. No spinal stenosis or epidural abscess. L5-S1: Status post decompression and fusion. No stenosis or abscess. IMPRESSION: 1. Previous T10 through Sacral spinal fusion with abundant metal hardware artifact. 2. Positive for a Ventral Epidural Abscess in the thoracic spine tracking from the T7 superior endplate inferiorly at least to T10-T11 - where abnormal signal in the disc is highly suspicious for Infectious Discitis. Associated meningeal thickening and enhancement extending from that level cephalad to T4. 3. 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 4. No definite spinal cord edema, although detail of the conus is obscured by hardware artifact. 5. Solitary 3 cm posterior paraspinal fluid collection about the right T12 and L1 spinal rod, although fluid signal more resembles hematoma than abscess. 6. Otherwise extensive postoperative signal changes to the thoracolumbar erector spinae muscles, with ano obvious intramuscular abscess. 7. No definite lumbar epidural abscess. Visible sacrum and SI joints also appear spared. Electronically Signed   By: Odessa FlemingH  Hall M.D.   On: 01/11/2021 04:44   MR Lumbar Spine W Wo Contrast  Result Date: 01/11/2021 CLINICAL DATA:  70 year old male with fever and "low back abscess". Prior spine surgery in 2021. Approximately 1 week of increasing left side back and flank pain. Found to have large 9.6 x 7.7 x 10.8 cm left flank or pelvic loculated fluid collection at outside facility suspicious for seroma versus abscess EXAM: MRI CERVICAL, THORACIC AND LUMBAR SPINE WITHOUT AND WITH CONTRAST TECHNIQUE: Multiplanar and multiecho pulse sequences of  the cervical spine, to include the craniocervical junction and cervicothoracic junction, and  thoracic and lumbar spine, were obtained without and with intravenous contrast. CONTRAST:  54mL GADAVIST GADOBUTROL 1 MMOL/ML IV SOLN COMPARISON:  Scoliosis radiographs 08/14/2020 and earlier. FINDINGS: MRI THORACIC SPINE FINDINGS Limited cervical spine imaging: Straightening of cervical lordosis. Generalized decreased T1 bone marrow signal. Thoracic spine segmentation:  Normal on the comparison radiographs. Alignment:  Preserved thoracic kyphosis. Vertebrae: Mild generalized decreased T1 marrow signal in the thoracic spine, although remaining within normal limits from T9 and above. Superimposed T10 through lumbar posterior spinal hardware artifact. No convincing T10 through T12 marrow edema on the thoracic images. Cord: Abnormal dural thickening and enhancement extending from the lower thoracic spine which is affected by hardware artifact cephalad through the T4 level. Beginning at T7 and continuing inferiorly is a ventral epidural fluid collection with rim enhancement measuring up to 6 mm in thickness (at T8 series 27, image 25) which effaces the ventral CSF space at many lower thoracic levels. This continues toward the thoracolumbar junction, although thecal sac detail is limited by hardware artifact at T11 and T12. No definite associated cord edema. Above T4 the thecal sac and dura appear normal. Conus medullaris is obscured. Paraspinal and other soft tissues: Muscle postoperative changes. Suggesting high protein content or blood. There is surrounding right thoracic erector spinae muscle edema, superimposed on chronic postoperative changes. Small to moderate layering bilateral pleural effusions are visible in the chest. Disc levels: Disc spaces are relatively normal for age above T9. Beginning in the midthoracic spine and continuing inferiorly is moderate and ultimately severe bilateral posterior facet hypertrophy. Beginning at T8-T9 there is multifactorial mild spinal stenosis in conjunction with the ventral  epidural abscess. Similar mild spinal stenosis at T9-T10. The T10-T11 disc is abnormally T2 and STIR hyperintense, although evaluation for enhancement is limited due to hardware artifact. Possible mild endplate erosions there. Mild to moderate spinal stenosis there in part due to mass effect from moderate to severe posterior element hypertrophy on the right (series 27, image 32). T11-T12: disc space loss with disc bulging and endplate spurring. Up to moderate multifactorial spinal stenosis there. T12-L1 level is slightly better depicted on the lumbar images. MRI LUMBAR SPINE FINDINGS Segmentation:  Normal as seen on the comparison radiographs. Alignment:  Preserved lumbar lordosis. Vertebrae: Diffuse hardware susceptibility artifact, with interbody implants in addition to the posterior spinal hardware from L2-L3 through L5-S1. No definite lumbar marrow edema. No convincing sacral marrow edema. Grossly intact SI joints. Conus medullaris and cauda equina: Conus is obscured by hardware artifact. Cauda equina roots so in the lower lumbar thecal sac appear normal. Hardware artifact limits evaluation for abnormal dural thickening and enhancement in the lumbar spine, but the dura below L3 appears normal. Paraspinal and other soft tissues: Posterior paraspinal muscle postoperative changes. A small roughly 3 cm collection along the right side posterior T12 and L1 hardware has intrinsic T1 and T2 hyperintensity as above. Otherwise there is thin midline subcutaneous fluid or granulation tissue which is not rim enhancing and fairly dark on T2. No psoas muscle abscess identified. Disc levels: T12-L1: This level remains fairly obscured due to hardware artifact, but on series 30, image 7 there does appear to be at least mild spinal stenosis. Unclear whether the epidural collection extends to this level. L1-L2: Obscured on axial images but on sagittal series 30, image 8 there is no strong evidence of epidural abscess or spinal  stenosis. L2-L3: Obscured on axial images. Sagittal images  demonstrate interbody implant with no convincing epidural abscess. There does appear to be mild spinal stenosis. L3-L4: Sequelae of decompression and posterior, interbody fusion here. No definite spinal stenosis or epidural abscess. L4-L5: Decompression and fusion. Left side posterior element hypertrophy results in mild left L4 foraminal stenosis. No spinal stenosis or epidural abscess. L5-S1: Status post decompression and fusion. No stenosis or abscess. IMPRESSION: 1. Previous T10 through Sacral spinal fusion with abundant metal hardware artifact. 2. Positive for a Ventral Epidural Abscess in the thoracic spine tracking from the T7 superior endplate inferiorly at least to T10-T11 - where abnormal signal in the disc is highly suspicious for Infectious Discitis. Associated meningeal thickening and enhancement extending from that level cephalad to T4. 3. 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 4. No definite spinal cord edema, although detail of the conus is obscured by hardware artifact. 5. Solitary 3 cm posterior paraspinal fluid collection about the right T12 and L1 spinal rod, although fluid signal more resembles hematoma than abscess. 6. Otherwise extensive postoperative signal changes to the thoracolumbar erector spinae muscles, with ano obvious intramuscular abscess. 7. No definite lumbar epidural abscess. Visible sacrum and SI joints also appear spared. Electronically Signed   By: Odessa Fleming M.D.   On: 01/11/2021 04:44   DG Chest Portable 1 View  Result Date: 01/10/2021 CLINICAL DATA:  Fever, lower back abscess EXAM: PORTABLE CHEST 1 VIEW COMPARISON:  11/13/2007 FINDINGS: Single frontal view of the chest demonstrates an unremarkable cardiac silhouette. No airspace disease, effusion, or pneumothorax. Postsurgical changes are seen at the thoracolumbar  junction. No acute displaced fractures. IMPRESSION: 1. No acute intrathoracic process. Electronically Signed   By: Sharlet Salina M.D.   On: 01/10/2021 22:58   US Abdomen Limited RUQ (LIVER/GB)  Result Date: 01/11/2021 CLINICAL DATA:  Elevated transaminase EXAM: ULTRASOUND ABDOMEN LIMITED RIGHT UPPER QUADRANT COMPARISON:  None. FINDINGS: Gallbladder: 10 mm gallbladder polyp, although without vascularity. No gallstones or gallbladder wall thickening. Negative sonographic Murphy's sign. Common bile duct: Diameter: 4 mm Liver: Hyperechoic hepatic parenchyma. No focal hepatic lesion is seen. Portal vein is patent on color Doppler imaging with normal direction of blood flow towards the liver. Other: None. IMPRESSION: Hyperechoic hepatic parenchyma, suggesting hepatic steatosis. 10 mm gallbladder polyp. Given size, outpatient surgical consultation is suggested for consideration of elective cholecystectomy. If a conservative approach is desired, this is amenable to annual follow-up ultrasound. Electronically Signed   By: Charline Bills M.D.   On: 01/11/2021 01:57    Assessment/Plan: 70 y.o. male who was brought to the ED due to worsening left flank pain and generalized weakness. He is s/p L4/5 and L5/S1 ALIF with left-sided L2/3 and L3/4 XLIF with T10 through pelvis fixation. CT imaging at outside facility revealed 9.6 x 7.7 x 10.8 cm loculated fluid collection concerning for seroma versus abscess in the left flank and pelvis.  He was febrile in the emergency department and was given Tylenol just prior to transfer.  He was given Zosyn and vancomycin in the ED prior to transfer. MRI revealed epidural abscess in the upper thoracic spine without significant stenosis.  The epidural abscesses are in the ventral space.  There is also fluid collection along the hardware which is concerning for either hematoma versus abscess. IR performed image guided aspiration of a right paraspinal fluid collection on 01/11/2021. Per  IR, aspirated fluid was old blood and was sent for GS, C&S.   Upon  my examination of the patient this morning, the hospital's internet was non-operational and I was unable to view his MRIs. I will evaluate them once the internet is working and make recommendations whether or not the patient needs to be taken back to surgery. Continue supportive care, pain control, and encourage mobility.   LOS: 1 day     [employer], DNP, NP-C 01/12/2021, 10:24 AM

## 2021-01-12 NOTE — Progress Notes (Signed)
PROGRESS NOTE    Marco Kennedy  JYN:[id number] DOB: 10-14-1950 DOA: 01/10/2021 PCP: Maximiano Coss, MD   Brief Narrative:  Marco Kennedy is a 70 y.o. male with medical history significant for back surgery with pelvis fixation in November 2021, OSA on BiPAP, who presented to [facility] ED due to sudden onset progressively worsening left flank pain of 1 week duration.  Associated with nausea, vomiting, generalized weakness, subjective fevers and chills and sweats.  CT lumbar spine obtained in the [facility] ED revealed moderate size abscess located near spinal hardware.  Since his back surgery was done by Dr. Venetia Maxon, decision was made to transfer to [facility] for neurosurgical assessment.  Seen by neurosurgery who recommended hospitalist admission, they will see in consultation.  Assessment & Plan:   Active Problems:   Abscess  Sepsis secondary to moderate size abscess located near the pelvic hardware, POA. Status post back surgery/hardware insertion - NeuroSx and interventional IR following - Imaging shows "Ventral Epidural Abscess in the thoracic spine tracking from the T7 superior endplate inferiorly at least to T10-T11 - where abnormal signal in the disc is highly suspicious for infectious Discitis" - Pain well controlled -Fluid collection aspirated 01/11/2021, follow cultures -Broad-spectrum antibiotics ongoing including Vanc/Zosyn -we will discuss with ID once cultures resolved for de-escalation and duration recommendations   Intractable nausea and vomiting with elevated liver chemistries - Likely secondary to above sepsis  - RUQ - 10 mm gallbladder polyp seen - RUQ benign on exam; labs downtrending with treatment of sepsis   Constipation, POA  -Patient reports 7 days of constipation, now worsened in the setting of narcotics -Increase MiraLAX and Senokot  Pain induced hypertension  Improved with pain control   Hypokalemia Mild, repleted    Hyperglycemia No oral  hypo-glycemic's seen on his list of home medication A1c 5.9  OSA on BiPAP Resume BiPAP nightly  DVT prophylaxis: Subcu Lovenox daily Code Status: Full code Family Communication: At bedside  Status is: inpt  Dispo: The patient is from: Home              Anticipated d/c is to: Home              Anticipated d/c date is: 72+ hours              Patient currently not medically stable for discharge  Consultants:  Neurosurgery, interventional radiology  Procedures:  Possible abscess aspiration  Antimicrobials:  Vancomycin, Zosyn 01/10/2021, ongoing  Subjective: No acute issues or events overnight, tolerated procedure well yesterday, pain currently well controlled; denies nausea vomiting diarrhea headache fevers or chills.  Complaining of constipation as above  Objective: Vitals:   01/11/21 2349 01/12/21 0352 01/12/21 0750 01/12/21 1204  BP: (!) 149/67 (!) 146/92 (!) 152/66 (!) 147/69  Pulse: 92 88 83   Resp: 16 20 20 20   Temp: 98.6 F (37 C) 98.4 F (36.9 C) 99.1 F (37.3 C) (!) 102.4 F (39.1 C)  TempSrc: Oral Oral Oral Oral  SpO2: 97% 96% 92% 99%  Weight:      Height:        Intake/Output Summary (Last 24 hours) at 01/12/2021 1446 Last data filed at 01/12/2021 1045 Gross per 24 hour  Intake --  Output 1000 ml  Net -1000 ml    Filed Weights   01/10/21 2226 01/11/21 0452 01/11/21 0551  Weight: 108 kg 69.1 kg 122.2 kg    Examination:  General:  Pleasantly resting in bed, No acute distress.  HEENT:  Normocephalic atraumatic.  Sclerae nonicteric, noninjected.  Extraocular movements intact bilaterally. Neck:  Without mass or deformity.  Trachea is midline. Lungs:  Clear to auscultate bilaterally without rhonchi, wheeze, or rales. Heart:  Regular rate and rhythm.  Without murmurs, rubs, or gallops. Abdomen:  Soft, nontender, nondistended.  Without guarding or rebound. Extremities: Without cyanosis, clubbing, edema, or obvious deformity. Vascular:  Dorsalis pedis and  posterior tibial pulses palpable bilaterally. Skin:  Warm and dry, no erythema, no ulcerations.   Data Reviewed: I have personally reviewed following labs and imaging studies  CBC: Recent Labs  Lab 01/10/21 2218 01/11/21 0500 01/12/21 0436  WBC 15.1* 15.9* 17.8*  NEUTROABS 12.7*  --   --   HGB 13.1 12.1* 11.2*  HCT 41.2 36.8* 34.9*  MCV 87.1 85.6 87.5  PLT 173 177 213    Basic Metabolic Panel: Recent Labs  Lab 01/10/21 2218 01/11/21 0500 01/11/21 0507 01/12/21 0436  NA 136 135  --  134*  K 3.4* 3.3*  --  3.9  CL 100 103  --  101  CO2 27 24  --  24  GLUCOSE 167* 163*  --  109*  BUN 24* 20  --  20  CREATININE 0.99 0.94  --  0.98  CALCIUM 8.0* 7.7*  --  7.7*  MG  --   --  1.9  --   PHOS  --   --  2.7  --     GFR: Estimated Creatinine Clearance: 87.8 mL/min (by C-G formula based on SCr of 0.98 mg/dL). Liver Function Tests: Recent Labs  Lab 01/10/21 2218 01/11/21 0500 01/12/21 0436  AST 163* 122* 90*  ALT 112* 103* 78*  ALKPHOS 108 103 98  BILITOT 1.5* 1.9* 1.7*  PROT 5.9* 5.5* 5.1*  ALBUMIN 2.4* 2.3* 2.0*    Recent Labs  Lab 01/10/21 2218  LIPASE 28    No results for input(s): AMMONIA in the last 168 hours. Coagulation Profile: No results for input(s): INR, PROTIME in the last 168 hours. Cardiac Enzymes: No results for input(s): CKTOTAL, CKMB, CKMBINDEX, TROPONINI in the last 168 hours. BNP (last 3 results) No results for input(s): PROBNP in the last 8760 hours. HbA1C: Recent Labs    01/11/21 0500  HGBA1C 5.9*    CBG: Recent Labs  Lab 01/11/21 2005 01/11/21 2348 01/12/21 0351 01/12/21 0749 01/12/21 1213  GLUCAP 116* 106* 121* 106* 123*   Lipid Profile: No results for input(s): CHOL, HDL, LDLCALC, TRIG, CHOLHDL, LDLDIRECT in the last 72 hours. Thyroid Function Tests: No results for input(s): TSH, T4TOTAL, FREET4, T3FREE, THYROIDAB in the last 72 hours. Anemia Panel: No results for input(s): VITAMINB12, FOLATE, FERRITIN, TIBC, IRON,  RETICCTPCT in the last 72 hours. Sepsis Labs: Recent Labs  Lab 01/10/21 2218 01/11/21 0500 01/12/21 0436  PROCALCITON  --  1.61 2.09  LATICACIDVEN 1.4  --   --      Recent Results (from the past 240 hour(s))  Blood culture (routine x 2)     Status: None (Preliminary result)   Collection Time: 01/10/21 10:18 PM   Specimen: BLOOD LEFT FOREARM  Result Value Ref Range Status   Specimen Description BLOOD LEFT FOREARM  Final   Special Requests   Final    BOTTLES DRAWN AEROBIC AND ANAEROBIC Blood Culture adequate volume   Culture   Final    NO GROWTH 2 DAYS Performed at [facility], [address], [place], [place] [address]    Report Status PENDING  Incomplete  Blood culture (routine x 2)     Status: None (Preliminary result)   Collection Time: 01/10/21 10:18 PM   Specimen: BLOOD  Result Value Ref Range Status   Specimen Description BLOOD RIGHT ANTECUBITAL  Final   Special Requests   Final    BOTTLES DRAWN AEROBIC AND ANAEROBIC Blood Culture adequate volume   Culture   Final    NO GROWTH 1 DAY Performed at [facility], [address] [place], [place] [address]    Report Status PENDING  Incomplete  Aerobic/Anaerobic Culture w Gram Stain (surgical/deep wound)     Status: None (Preliminary result)   Collection Time: 01/11/21  3:02 PM   Specimen: Wound  Result Value Ref Range Status   Specimen Description WOUND  Final   Special Requests ABSCESS OF BACK  Final   Gram Stain   Final    FEW WBC PRESENT, PREDOMINANTLY PMN NO ORGANISMS SEEN    Culture   Final    RARE GRAM NEGATIVE RODS IDENTIFICATION AND SUSCEPTIBILITIES TO FOLLOW Performed at [facility], [address], [place], [place] [address]    Report Status PENDING  Incomplete         Radiology Studies: MR THORACIC SPINE W WO CONTRAST  Result Date: 01/11/2021 CLINICAL DATA:  70 year old male with fever and "low back abscess". Prior spine surgery in 2021. Approximately 1 week of increasing  left side back and flank pain. Found to have large 9.6 x 7.7 x 10.8 cm left flank or pelvic loculated fluid collection at outside facility suspicious for seroma versus abscess EXAM: MRI CERVICAL, THORACIC AND LUMBAR SPINE WITHOUT AND WITH CONTRAST TECHNIQUE: Multiplanar and multiecho pulse sequences of the cervical spine, to include the craniocervical junction and cervicothoracic junction, and thoracic and lumbar spine, were obtained without and with intravenous contrast. CONTRAST:  103mL GADAVIST GADOBUTROL 1 MMOL/ML IV SOLN COMPARISON:  Scoliosis radiographs 08/14/2020 and earlier. FINDINGS: MRI THORACIC SPINE FINDINGS Limited cervical spine imaging: Straightening of cervical lordosis. Generalized decreased T1 bone marrow signal. Thoracic spine segmentation:  Normal on the comparison radiographs. Alignment:  Preserved thoracic kyphosis. Vertebrae: Mild generalized decreased T1 marrow signal in the thoracic spine, although remaining within normal limits from T9 and above. Superimposed T10 through lumbar posterior spinal hardware artifact. No convincing T10 through T12 marrow edema on the thoracic images. Cord: Abnormal dural thickening and enhancement extending from the lower thoracic spine which is affected by hardware artifact cephalad through the T4 level. Beginning at T7 and continuing inferiorly is a ventral epidural fluid collection with rim enhancement measuring up to 6 mm in thickness (at T8 series 27, image 25) which effaces the ventral CSF space at many lower thoracic levels. This continues toward the thoracolumbar junction, although thecal sac detail is limited by hardware artifact at T11 and T12. No definite associated cord edema. Above T4 the thecal sac and dura appear normal. Conus medullaris is obscured. Paraspinal and other soft tissues: Muscle postoperative changes. Suggesting high protein content or blood. There is surrounding right thoracic erector spinae muscle edema, superimposed on chronic  postoperative changes. Small to moderate layering bilateral pleural effusions are visible in the chest. Disc levels: Disc spaces are relatively normal for age above T9. Beginning in the midthoracic spine and continuing inferiorly is moderate and ultimately severe bilateral posterior facet hypertrophy. Beginning at T8-T9 there is multifactorial mild spinal stenosis in conjunction with the ventral epidural abscess. Similar mild spinal stenosis at T9-T10. The T10-T11 disc is abnormally T2  and STIR hyperintense, although evaluation for enhancement is limited due to hardware artifact. Possible mild endplate erosions there. Mild to moderate spinal stenosis there in part due to mass effect from moderate to severe posterior element hypertrophy on the right (series 27, image 32). T11-T12: disc space loss with disc bulging and endplate spurring. Up to moderate multifactorial spinal stenosis there. T12-L1 level is slightly better depicted on the lumbar images. MRI LUMBAR SPINE FINDINGS Segmentation:  Normal as seen on the comparison radiographs. Alignment:  Preserved lumbar lordosis. Vertebrae: Diffuse hardware susceptibility artifact, with interbody implants in addition to the posterior spinal hardware from L2-L3 through L5-S1. No definite lumbar marrow edema. No convincing sacral marrow edema. Grossly intact SI joints. Conus medullaris and cauda equina: Conus is obscured by hardware artifact. Cauda equina roots so in the lower lumbar thecal sac appear normal. Hardware artifact limits evaluation for abnormal dural thickening and enhancement in the lumbar spine, but the dura below L3 appears normal. Paraspinal and other soft tissues: Posterior paraspinal muscle postoperative changes. A small roughly 3 cm collection along the right side posterior T12 and L1 hardware has intrinsic T1 and T2 hyperintensity as above. Otherwise there is thin midline subcutaneous fluid or granulation tissue which is not rim enhancing and fairly dark  on T2. No psoas muscle abscess identified. Disc levels: T12-L1: This level remains fairly obscured due to hardware artifact, but on series 30, image 7 there does appear to be at least mild spinal stenosis. Unclear whether the epidural collection extends to this level. L1-L2: Obscured on axial images but on sagittal series 30, image 8 there is no strong evidence of epidural abscess or spinal stenosis. L2-L3: Obscured on axial images. Sagittal images demonstrate interbody implant with no convincing epidural abscess. There does appear to be mild spinal stenosis. L3-L4: Sequelae of decompression and posterior, interbody fusion here. No definite spinal stenosis or epidural abscess. L4-L5: Decompression and fusion. Left side posterior element hypertrophy results in mild left L4 foraminal stenosis. No spinal stenosis or epidural abscess. L5-S1: Status post decompression and fusion. No stenosis or abscess. IMPRESSION: 1. Previous T10 through Sacral spinal fusion with abundant metal hardware artifact. 2. Positive for a Ventral Epidural Abscess in the thoracic spine tracking from the T7 superior endplate inferiorly at least to T10-T11 - where abnormal signal in the disc is highly suspicious for Infectious Discitis. Associated meningeal thickening and enhancement extending from that level cephalad to T4. 3. 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 4. No definite spinal cord edema, although detail of the conus is obscured by hardware artifact. 5. Solitary 3 cm posterior paraspinal fluid collection about the right T12 and L1 spinal rod, although fluid signal more resembles hematoma than abscess. 6. Otherwise extensive postoperative signal changes to the thoracolumbar erector spinae muscles, with ano obvious intramuscular abscess. 7. No definite lumbar epidural abscess. Visible sacrum and SI joints also appear spared.  Electronically Signed   By: Odessa FlemingH  Hall M.D.   On: 01/11/2021 04:44   MR Lumbar Spine W Wo Contrast  Result Date: 01/11/2021 CLINICAL DATA:  70 year old male with fever and "low back abscess". Prior spine surgery in 2021. Approximately 1 week of increasing left side back and flank pain. Found to have large 9.6 x 7.7 x 10.8 cm left flank or pelvic loculated fluid collection at outside facility suspicious for seroma versus abscess EXAM: MRI CERVICAL, THORACIC AND LUMBAR SPINE WITHOUT AND WITH CONTRAST TECHNIQUE: Multiplanar  and multiecho pulse sequences of the cervical spine, to include the craniocervical junction and cervicothoracic junction, and thoracic and lumbar spine, were obtained without and with intravenous contrast. CONTRAST:  10mL GADAVIST GADOBUTROL 1 MMOL/ML IV SOLN COMPARISON:  Scoliosis radiographs 08/14/2020 and earlier. FINDINGS: MRI THORACIC SPINE FINDINGS Limited cervical spine imaging: Straightening of cervical lordosis. Generalized decreased T1 bone marrow signal. Thoracic spine segmentation:  Normal on the comparison radiographs. Alignment:  Preserved thoracic kyphosis. Vertebrae: Mild generalized decreased T1 marrow signal in the thoracic spine, although remaining within normal limits from T9 and above. Superimposed T10 through lumbar posterior spinal hardware artifact. No convincing T10 through T12 marrow edema on the thoracic images. Cord: Abnormal dural thickening and enhancement extending from the lower thoracic spine which is affected by hardware artifact cephalad through the T4 level. Beginning at T7 and continuing inferiorly is a ventral epidural fluid collection with rim enhancement measuring up to 6 mm in thickness (at T8 series 27, image 25) which effaces the ventral CSF space at many lower thoracic levels. This continues toward the thoracolumbar junction, although thecal sac detail is limited by hardware artifact at T11 and T12. No definite associated cord edema. Above T4 the thecal  sac and dura appear normal. Conus medullaris is obscured. Paraspinal and other soft tissues: Muscle postoperative changes. Suggesting high protein content or blood. There is surrounding right thoracic erector spinae muscle edema, superimposed on chronic postoperative changes. Small to moderate layering bilateral pleural effusions are visible in the chest. Disc levels: Disc spaces are relatively normal for age above T9. Beginning in the midthoracic spine and continuing inferiorly is moderate and ultimately severe bilateral posterior facet hypertrophy. Beginning at T8-T9 there is multifactorial mild spinal stenosis in conjunction with the ventral epidural abscess. Similar mild spinal stenosis at T9-T10. The T10-T11 disc is abnormally T2 and STIR hyperintense, although evaluation for enhancement is limited due to hardware artifact. Possible mild endplate erosions there. Mild to moderate spinal stenosis there in part due to mass effect from moderate to severe posterior element hypertrophy on the right (series 27, image 32). T11-T12: disc space loss with disc bulging and endplate spurring. Up to moderate multifactorial spinal stenosis there. T12-L1 level is slightly better depicted on the lumbar images. MRI LUMBAR SPINE FINDINGS Segmentation:  Normal as seen on the comparison radiographs. Alignment:  Preserved lumbar lordosis. Vertebrae: Diffuse hardware susceptibility artifact, with interbody implants in addition to the posterior spinal hardware from L2-L3 through L5-S1. No definite lumbar marrow edema. No convincing sacral marrow edema. Grossly intact SI joints. Conus medullaris and cauda equina: Conus is obscured by hardware artifact. Cauda equina roots so in the lower lumbar thecal sac appear normal. Hardware artifact limits evaluation for abnormal dural thickening and enhancement in the lumbar spine, but the dura below L3 appears normal. Paraspinal and other soft tissues: Posterior paraspinal muscle postoperative  changes. A small roughly 3 cm collection along the right side posterior T12 and L1 hardware has intrinsic T1 and T2 hyperintensity as above. Otherwise there is thin midline subcutaneous fluid or granulation tissue which is not rim enhancing and fairly dark on T2. No psoas muscle abscess identified. Disc levels: T12-L1: This level remains fairly obscured due to hardware artifact, but on series 30, image 7 there does appear to be at least mild spinal stenosis. Unclear whether the epidural collection extends to this level. L1-L2: Obscured on axial images but on sagittal series 30, image 8 there is no strong evidence of epidural abscess or spinal stenosis. L2-L3: Obscured on  axial images. Sagittal images demonstrate interbody implant with no convincing epidural abscess. There does appear to be mild spinal stenosis. L3-L4: Sequelae of decompression and posterior, interbody fusion here. No definite spinal stenosis or epidural abscess. L4-L5: Decompression and fusion. Left side posterior element hypertrophy results in mild left L4 foraminal stenosis. No spinal stenosis or epidural abscess. L5-S1: Status post decompression and fusion. No stenosis or abscess. IMPRESSION: 1. Previous T10 through Sacral spinal fusion with abundant metal hardware artifact. 2. Positive for a Ventral Epidural Abscess in the thoracic spine tracking from the T7 superior endplate inferiorly at least to T10-T11 - where abnormal signal in the disc is highly suspicious for Infectious Discitis. Associated meningeal thickening and enhancement extending from that level cephalad to T4. 3. 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 4. No definite spinal cord edema, although detail of the conus is obscured by hardware artifact. 5. Solitary 3 cm posterior paraspinal fluid collection about the right T12 and L1 spinal rod, although fluid signal more resembles  hematoma than abscess. 6. Otherwise extensive postoperative signal changes to the thoracolumbar erector spinae muscles, with ano obvious intramuscular abscess. 7. No definite lumbar epidural abscess. Visible sacrum and SI joints also appear spared. Electronically Signed   By: Odessa Fleming M.D.   On: 01/11/2021 04:44   DG Chest Portable 1 View  Result Date: 01/10/2021 CLINICAL DATA:  Fever, lower back abscess EXAM: PORTABLE CHEST 1 VIEW COMPARISON:  11/13/2007 FINDINGS: Single frontal view of the chest demonstrates an unremarkable cardiac silhouette. No airspace disease, effusion, or pneumothorax. Postsurgical changes are seen at the thoracolumbar junction. No acute displaced fractures. IMPRESSION: 1. No acute intrathoracic process. Electronically Signed   By: Sharlet Salina M.D.   On: 01/10/2021 22:58   US Abdomen Limited RUQ (LIVER/GB)  Result Date: 01/11/2021 CLINICAL DATA:  Elevated transaminase EXAM: ULTRASOUND ABDOMEN LIMITED RIGHT UPPER QUADRANT COMPARISON:  None. FINDINGS: Gallbladder: 10 mm gallbladder polyp, although without vascularity. No gallstones or gallbladder wall thickening. Negative sonographic Murphy's sign. Common bile duct: Diameter: 4 mm Liver: Hyperechoic hepatic parenchyma. No focal hepatic lesion is seen. Portal vein is patent on color Doppler imaging with normal direction of blood flow towards the liver. Other: None. IMPRESSION: Hyperechoic hepatic parenchyma, suggesting hepatic steatosis. 10 mm gallbladder polyp. Given size, outpatient surgical consultation is suggested for consideration of elective cholecystectomy. If a conservative approach is desired, this is amenable to annual follow-up ultrasound. Electronically Signed   By: Charline Bills M.D.   On: 01/11/2021 01:57     Scheduled Meds:  enoxaparin (LOVENOX) injection  60 mg Subcutaneous Q24H   insulin aspart  0-15 Units Subcutaneous Q4H   polyethylene glycol  17 g Oral BID   senna-docusate  1 tablet Oral BID    Continuous Infusions:  lactated ringers 50 mL/hr at 01/12/21 0407   piperacillin-tazobactam (ZOSYN)  IV 3.375 g (01/12/21 6226)   vancomycin 1,500 mg (01/11/21 1306)     LOS: 1 day   Time spent:  Azucena Fallen, DO [facility]  If 7PM-7AM, please contact night-coverage [email or web address]  01/12/2021, 2:46 PM

## 2021-01-13 ENCOUNTER — Inpatient Hospital Stay (HOSPITAL_COMMUNITY): Payer: BC Managed Care – PPO | Admitting: Certified Registered"

## 2021-01-13 ENCOUNTER — Encounter (HOSPITAL_COMMUNITY)
Admission: EM | Disposition: A | Discharge status: Home Health | Payer: Self-pay | Source: Other Acute Inpatient Hospital | Attending: Internal Medicine

## 2021-01-13 ENCOUNTER — Encounter (HOSPITAL_COMMUNITY): Payer: Self-pay | Admitting: Internal Medicine

## 2021-01-13 DIAGNOSIS — L02212 Cutaneous abscess of back [any part, except buttock]: Secondary | ICD-10-CM

## 2021-01-13 DIAGNOSIS — T847XXA Infection and inflammatory reaction due to other internal orthopedic prosthetic devices, implants and grafts, initial encounter: Secondary | ICD-10-CM

## 2021-01-13 DIAGNOSIS — M4620 Osteomyelitis of vertebra, site unspecified: Secondary | ICD-10-CM | POA: Diagnosis not present

## 2021-01-13 HISTORY — PX: LUMBAR WOUND DEBRIDEMENT: SHX1988

## 2021-01-13 LAB — COMPREHENSIVE METABOLIC PANEL
ALT: 101 U/L — ABNORMAL HIGH (ref 0–44)
AST: 131 U/L — ABNORMAL HIGH (ref 15–41)
Albumin: 2 g/dL — ABNORMAL LOW (ref 3.5–5.0)
Alkaline Phosphatase: 94 U/L (ref 38–126)
Anion gap: 10 (ref 5–15)
BUN: 21 mg/dL (ref 8–23)
CO2: 25 mmol/L (ref 22–32)
Calcium: 7.7 mg/dL — ABNORMAL LOW (ref 8.9–10.3)
Chloride: 100 mmol/L (ref 98–111)
Creatinine, Ser: 0.91 mg/dL (ref 0.61–1.24)
GFR, Estimated: >60 mL/min (ref >60)
Glucose, Bld: 121 mg/dL — ABNORMAL HIGH (ref 70–99)
Potassium: 3.5 mmol/L (ref 3.5–5.1)
Sodium: 135 mmol/L (ref 135–145)
Total Bilirubin: 1.7 mg/dL — ABNORMAL HIGH (ref 0.3–1.2)
Total Protein: 5.3 g/dL — ABNORMAL LOW (ref 6.5–8.1)

## 2021-01-13 LAB — CBC
HCT: 34.3 % — ABNORMAL LOW (ref 39.0–52.0)
Hemoglobin: 11.2 g/dL — ABNORMAL LOW (ref 13.0–17.0)
MCH: 27.7 pg (ref 26.0–34.0)
MCHC: 32.7 g/dL (ref 30.0–36.0)
MCV: 84.9 fL (ref 80.0–100.0)
Platelets: 253 10*3/uL (ref 150–400)
RBC: 4.04 MIL/uL — ABNORMAL LOW (ref 4.22–5.81)
RDW: 14.8 % (ref 11.5–15.5)
WBC: 20.3 10*3/uL — ABNORMAL HIGH (ref 4.0–10.5)
nRBC: 0 % (ref 0.0–0.2)

## 2021-01-13 LAB — GLUCOSE, CAPILLARY
Glucose-Capillary: 121 mg/dL — ABNORMAL HIGH (ref 70–99)
Glucose-Capillary: 122 mg/dL — ABNORMAL HIGH (ref 70–99)
Glucose-Capillary: 124 mg/dL — ABNORMAL HIGH (ref 70–99)
Glucose-Capillary: 126 mg/dL — ABNORMAL HIGH (ref 70–99)
Glucose-Capillary: 138 mg/dL — ABNORMAL HIGH (ref 70–99)

## 2021-01-13 LAB — SARS CORONAVIRUS 2 BY RT PCR (HOSPITAL ORDER, PERFORMED IN ~~LOC~~ HOSPITAL LAB): SARS Coronavirus 2: NEGATIVE

## 2021-01-13 SURGERY — LUMBAR WOUND DEBRIDEMENT
Anesthesia: General

## 2021-01-13 MED ORDER — PHENYLEPHRINE 40 MCG/ML (10ML) SYRINGE FOR IV PUSH (FOR BLOOD PRESSURE SUPPORT)
PREFILLED_SYRINGE | INTRAVENOUS | Status: AC
  Filled 2021-01-13: qty 10

## 2021-01-13 MED ORDER — FENTANYL CITRATE (PF) 100 MCG/2ML IJ SOLN: 25.0000 ug | INTRAMUSCULAR | Status: DC | PRN

## 2021-01-13 MED ORDER — PROPOFOL 10 MG/ML IV BOLUS
INTRAVENOUS | Status: AC
  Filled 2021-01-13: qty 20

## 2021-01-13 MED ORDER — DICLOFENAC SODIUM 75 MG PO TBEC
75.0000 mg | DELAYED_RELEASE_TABLET | Freq: Two times a day (BID) | ORAL | Status: DC
  Administered 2021-01-13 – 2021-01-16 (×7): 75 mg via ORAL
  Filled 2021-01-13 (×9): qty 1

## 2021-01-13 MED ORDER — VANCOMYCIN HCL 1000 MG IV SOLR
INTRAVENOUS | Status: AC
  Filled 2021-01-13: qty 1000

## 2021-01-13 MED ORDER — ACETAMINOPHEN 10 MG/ML IV SOLN: 1000.0000 mg | Freq: Once | INTRAVENOUS | Status: DC | PRN

## 2021-01-13 MED ORDER — VITAMIN D 25 MCG (1000 UNIT) PO TABS
1000.0000 [IU] | ORAL_TABLET | Freq: Every day | ORAL | Status: DC
  Administered 2021-01-13 – 2021-01-16 (×4): 1000 [IU] via ORAL
  Filled 2021-01-13 (×4): qty 1

## 2021-01-13 MED ORDER — LACTATED RINGERS IV SOLN: INTRAVENOUS | Status: DC

## 2021-01-13 MED ORDER — ROCURONIUM BROMIDE 10 MG/ML (PF) SYRINGE
PREFILLED_SYRINGE | INTRAVENOUS | Status: DC | PRN
Start: 2021-01-13 — End: 2021-01-13
  Administered 2021-01-13: 60 mg via INTRAVENOUS

## 2021-01-13 MED ORDER — FENTANYL CITRATE (PF) 250 MCG/5ML IJ SOLN
INTRAMUSCULAR | Status: AC
  Filled 2021-01-13: qty 5

## 2021-01-13 MED ORDER — CEFAZOLIN SODIUM-DEXTROSE 2-3 GM-%(50ML) IV SOLR
INTRAVENOUS | Status: DC | PRN
  Administered 2021-01-13: 2 g via INTRAVENOUS

## 2021-01-13 MED ORDER — LIDOCAINE-EPINEPHRINE 1 %-1:100000 IJ SOLN
INTRAMUSCULAR | Status: DC | PRN
  Administered 2021-01-13: 5 mL

## 2021-01-13 MED ORDER — FENTANYL CITRATE (PF) 250 MCG/5ML IJ SOLN
INTRAMUSCULAR | Status: DC | PRN
  Administered 2021-01-13: 150 ug via INTRAVENOUS
  Administered 2021-01-13 (×4): 25 ug via INTRAVENOUS

## 2021-01-13 MED ORDER — 0.9 % SODIUM CHLORIDE (POUR BTL) OPTIME
TOPICAL | Status: DC | PRN
  Administered 2021-01-13: 1000 mL
  Administered 2021-01-13: 2000 mL

## 2021-01-13 MED ORDER — SUGAMMADEX SODIUM 200 MG/2ML IV SOLN
INTRAVENOUS | Status: DC | PRN
  Administered 2021-01-13: 300 mg via INTRAVENOUS

## 2021-01-13 MED ORDER — ONDANSETRON HCL 4 MG/2ML IJ SOLN
INTRAMUSCULAR | Status: AC
  Filled 2021-01-13: qty 2

## 2021-01-13 MED ORDER — ADULT MULTIVITAMIN W/MINERALS CH
1.0000 | ORAL_TABLET | Freq: Every day | ORAL | Status: DC
  Administered 2021-01-13 – 2021-01-16 (×4): 1 via ORAL
  Filled 2021-01-13 (×4): qty 1

## 2021-01-13 MED ORDER — LIDOCAINE-EPINEPHRINE 1 %-1:100000 IJ SOLN
INTRAMUSCULAR | Status: AC
  Filled 2021-01-13: qty 1

## 2021-01-13 MED ORDER — PROPOFOL 10 MG/ML IV BOLUS
INTRAVENOUS | Status: DC | PRN
  Administered 2021-01-13: 100 mg via INTRAVENOUS
  Administered 2021-01-13: 60 mg via INTRAVENOUS

## 2021-01-13 MED ORDER — LIDOCAINE 2% (20 MG/ML) 5 ML SYRINGE
INTRAMUSCULAR | Status: DC | PRN
  Administered 2021-01-13: 60 mg via INTRAVENOUS

## 2021-01-13 MED ORDER — PHENYLEPHRINE HCL-NACL 10-0.9 MG/250ML-% IV SOLN
INTRAVENOUS | Status: DC | PRN
  Administered 2021-01-13: 75 ug/min via INTRAVENOUS

## 2021-01-13 MED ORDER — ONDANSETRON HCL 4 MG/2ML IJ SOLN
INTRAMUSCULAR | Status: DC | PRN
Start: 2021-01-13 — End: 2021-01-13
  Administered 2021-01-13: 4 mg via INTRAVENOUS

## 2021-01-13 MED ORDER — ORAL CARE MOUTH RINSE: 15.0000 mL | Freq: Once | OROMUCOSAL | Status: AC

## 2021-01-13 MED ORDER — VANCOMYCIN HCL 1000 MG IV SOLR
INTRAVENOUS | Status: DC | PRN
  Administered 2021-01-13: 1000 mg via TOPICAL

## 2021-01-13 MED ORDER — AMISULPRIDE (ANTIEMETIC) 5 MG/2ML IV SOLN: 10.0000 mg | Freq: Once | INTRAVENOUS | Status: DC | PRN

## 2021-01-13 MED ORDER — PHENYLEPHRINE 40 MCG/ML (10ML) SYRINGE FOR IV PUSH (FOR BLOOD PRESSURE SUPPORT)
PREFILLED_SYRINGE | INTRAVENOUS | Status: DC | PRN
  Administered 2021-01-13 (×3): 120 ug via INTRAVENOUS
  Administered 2021-01-13: 40 ug via INTRAVENOUS

## 2021-01-13 MED ORDER — ACETAMINOPHEN 325 MG PO TABS
650.0000 mg | ORAL_TABLET | Freq: Four times a day (QID) | ORAL | Status: DC | PRN
  Administered 2021-01-13 – 2021-01-16 (×6): 650 mg via ORAL
  Filled 2021-01-13 (×6): qty 2

## 2021-01-13 MED ORDER — BUPIVACAINE HCL (PF) 0.5 % IJ SOLN
INTRAMUSCULAR | Status: DC | PRN
  Administered 2021-01-13: 5 mL

## 2021-01-13 MED ORDER — ROCURONIUM BROMIDE 100 MG/10ML IV SOLN: INTRAVENOUS | Status: DC | PRN

## 2021-01-13 MED ORDER — CEFAZOLIN SODIUM 1 G IJ SOLR
INTRAMUSCULAR | Status: AC
  Filled 2021-01-13: qty 20

## 2021-01-13 MED ORDER — CHLORHEXIDINE GLUCONATE 0.12 % MT SOLN: 15.0000 mL | Freq: Once | OROMUCOSAL | Status: AC

## 2021-01-13 MED ORDER — ALBUMIN HUMAN 5 % IV SOLN: INTRAVENOUS | Status: DC | PRN

## 2021-01-13 MED ORDER — LIDOCAINE HCL (CARDIAC) PF 100 MG/5ML IV SOSY: PREFILLED_SYRINGE | INTRAVENOUS | Status: DC | PRN

## 2021-01-13 MED ORDER — ONDANSETRON HCL 4 MG/2ML IJ SOLN: 4.0000 mg | Freq: Once | INTRAMUSCULAR | Status: DC | PRN

## 2021-01-13 MED ORDER — CHLORHEXIDINE GLUCONATE 0.12 % MT SOLN
OROMUCOSAL | Status: AC
  Administered 2021-01-13: 15 mL via OROMUCOSAL
  Filled 2021-01-13: qty 15

## 2021-01-13 MED ORDER — BUPIVACAINE HCL (PF) 0.5 % IJ SOLN
INTRAMUSCULAR | Status: AC
  Filled 2021-01-13: qty 30

## 2021-01-13 SURGICAL SUPPLY — 46 items
BAG COUNTER SPONGE SURGICOUNT (BAG) ×4 IMPLANT
CANISTER SUCT 3000ML PPV (MISCELLANEOUS) ×2 IMPLANT
CARTRIDGE OIL MAESTRO DRILL (MISCELLANEOUS) IMPLANT
CNTNR URN SCR LID CUP LEK RST (MISCELLANEOUS) ×1 IMPLANT
CONT SPEC 4OZ STRL OR WHT (MISCELLANEOUS) ×1
DECANTER SPIKE VIAL GLASS SM (MISCELLANEOUS) IMPLANT
DERMABOND ADVANCED (GAUZE/BANDAGES/DRESSINGS) ×1
DERMABOND ADVANCED .7 DNX12 (GAUZE/BANDAGES/DRESSINGS) ×1 IMPLANT
DIFFUSER DRILL AIR PNEUMATIC (MISCELLANEOUS) IMPLANT
DRAPE LAPAROTOMY 100X72X124 (DRAPES) ×2 IMPLANT
DRAPE SURG 17X23 STRL (DRAPES) ×2 IMPLANT
DRSG OPSITE POSTOP 4X8 (GAUZE/BANDAGES/DRESSINGS) ×2 IMPLANT
DURAPREP 26ML APPLICATOR (WOUND CARE) ×2 IMPLANT
ELECT REM PT RETURN 9FT ADLT (ELECTROSURGICAL) ×2
ELECTRODE REM PT RTRN 9FT ADLT (ELECTROSURGICAL) ×1 IMPLANT
EVACUATOR 1/8 PVC DRAIN (DRAIN) ×2 IMPLANT
GAUZE 4X4 16PLY ~~LOC~~+RFID DBL (SPONGE) ×2 IMPLANT
GAUZE SPONGE 4X4 12PLY STRL (GAUZE/BANDAGES/DRESSINGS) IMPLANT
GLOVE EXAM NITRILE XL STR (GLOVE) IMPLANT
GLOVE SRG 8 PF TXTR STRL LF DI (GLOVE) ×1 IMPLANT
GLOVE SURG ENC MOIS LTX SZ8 (GLOVE) ×2 IMPLANT
GLOVE SURG LTX SZ8 (GLOVE) ×2 IMPLANT
GLOVE SURG UNDER POLY LF SZ8 (GLOVE) ×1
GLOVE SURG UNDER POLY LF SZ8.5 (GLOVE) ×2 IMPLANT
GOWN STRL REUS W/ TWL LRG LVL3 (GOWN DISPOSABLE) IMPLANT
GOWN STRL REUS W/ TWL XL LVL3 (GOWN DISPOSABLE) IMPLANT
GOWN STRL REUS W/TWL 2XL LVL3 (GOWN DISPOSABLE) IMPLANT
GOWN STRL REUS W/TWL LRG LVL3 (GOWN DISPOSABLE)
GOWN STRL REUS W/TWL XL LVL3 (GOWN DISPOSABLE)
KIT BASIN OR (CUSTOM PROCEDURE TRAY) ×2 IMPLANT
KIT TURNOVER KIT B (KITS) ×2 IMPLANT
NEEDLE HYPO 25X5/8 SAFETYGLIDE (NEEDLE) ×2 IMPLANT
NS IRRIG 1000ML POUR BTL (IV SOLUTION) ×2 IMPLANT
OIL CARTRIDGE MAESTRO DRILL (MISCELLANEOUS)
PACK LAMINECTOMY NEURO (CUSTOM PROCEDURE TRAY) ×2 IMPLANT
PAD ARMBOARD 7.5X6 YLW CONV (MISCELLANEOUS) ×2 IMPLANT
SPONGE SURGIFOAM ABS GEL SZ50 (HEMOSTASIS) IMPLANT
SPONGE T-LAP 4X18 ~~LOC~~+RFID (SPONGE) ×2 IMPLANT
STAPLER SKIN PROX WIDE 3.9 (STAPLE) IMPLANT
SUT VIC AB 0 CT1 18XCR BRD8 (SUTURE) ×1 IMPLANT
SUT VIC AB 0 CT1 8-18 (SUTURE) ×1
SUT VIC AB 2-0 CT1 18 (SUTURE) ×4 IMPLANT
SUT VIC AB 3-0 SH 8-18 (SUTURE) ×6 IMPLANT
TOWEL GREEN STERILE (TOWEL DISPOSABLE) ×2 IMPLANT
TOWEL GREEN STERILE FF (TOWEL DISPOSABLE) ×2 IMPLANT
WATER STERILE IRR 1000ML POUR (IV SOLUTION) ×2 IMPLANT

## 2021-01-13 NOTE — Progress Notes (Signed)
[name], male   DOB: [birth year], 70 y.o.   MRN: [id number] Alert, conversant, reporting "good night until the chills woke me at 6am".  He notes similar symptoms intermittent for the past several days, prompting ER visits. He is NPO, verbalizing understanding of planned surgery this am, as discussed last evening.  **(We appreciate attentiveness of nursing staff in keeping pt NPO when orders did not come through)**  Pt will undergo lumbar incision and drainage this morning for abscess. Permit order entered.

## 2021-01-13 NOTE — Progress Notes (Signed)
PT Cancellation Note  Patient Details Name: Marco Kennedy MRN: [id number] DOB: [birth year]   Cancelled Treatment:    Reason Eval/Treat Not Completed: Patient at procedure or test/unavailable   [name] 01/13/2021, 8:49 AM [name], PT Acute Rehabilitation Services Pager: [phone] Office: [phone]

## 2021-01-13 NOTE — Transfer of Care (Signed)
Immediate Anesthesia Transfer of Care Note  Patient: Marco Kennedy  Procedure(s) Performed: Malena Peer WOUND DEBRIDEMENT  Patient Location: PACU  Anesthesia Type:General  Level of Consciousness: sedated, patient cooperative and responds to stimulation  Airway & Oxygen Therapy: Patient Spontanous Breathing and Patient connected to face mask oxygen  Post-op Assessment: Report given to RN, Post -op Vital signs reviewed and stable and Patient moving all extremities  Post vital signs: Reviewed and stable  Last Vitals:  Vitals Value Taken Time  BP 132/72 01/13/21 1052  Temp    Pulse 89 01/13/21 1052  Resp 28 01/13/21 1052  SpO2 88 % 01/13/21 1052  Vitals shown include unvalidated device data.  Last Pain:  Vitals:   01/13/21 0852  TempSrc:   PainSc: 3       Patients Stated Pain Goal: 2 (01/13/21 6160)  Complications: No notable events documented.

## 2021-01-13 NOTE — Anesthesia Procedure Notes (Signed)
Procedure Name: Intubation Date/Time: 01/13/2021 9:20 AM Performed by: Myna Bright, CRNA Pre-anesthesia Checklist: Patient identified, Emergency Drugs available, Suction available and Patient being monitored Patient Re-evaluated:Patient Re-evaluated prior to induction Oxygen Delivery Method: Circle system utilized Preoxygenation: Pre-oxygenation with 100% oxygen Induction Type: IV induction Ventilation: Oral airway inserted - appropriate to patient size and Mask ventilation with difficulty Laryngoscope Size: Mac and 4 Grade View: Grade I Tube type: Oral Tube size: 7.5 mm Number of attempts: 1 Airway Equipment and Method: Stylet Placement Confirmation: ETT inserted through vocal cords under direct vision, positive ETCO2 and breath sounds checked- equal and bilateral Secured at: 22 cm Tube secured with: Tape Dental Injury: Teeth and Oropharynx as per pre-operative assessment

## 2021-01-13 NOTE — Progress Notes (Signed)
PROGRESS NOTE    Marco Kennedy  IDP:[id number] DOB: Oct 09, 1950 DOA: 01/10/2021 PCP: Maximiano Coss, MD   Brief Narrative:  Marco Kennedy is a 70 y.o. male with medical history significant for back surgery with pelvis fixation in November 2021, OSA on BiPAP, who presented to [facility] ED due to sudden onset progressively worsening left flank pain of 1 week duration.  Associated with nausea, vomiting, generalized weakness, subjective fevers and chills and sweats.  CT lumbar spine obtained in the [facility] ED revealed moderate size abscess located near spinal hardware.  Since his back surgery was done by Dr. Venetia Maxon, decision was made to transfer to [facility] for neurosurgical assessment.  Seen by neurosurgery who recommended hospitalist admission, they will see in consultation.  Assessment & Plan:   Active Problems:   Abscess  Sepsis secondary to moderate size abscess located near the pelvic hardware, POA. Status post back surgery/hardware insertion - NeuroSx and interventional IR following, appreciate insight and recommendations - Plan for wound debridement with neurosurgery later this morning 01/13/2021 - Pain well controlled - Fluid collection aspirated 01/11/2021, follow cultures - Broad-spectrum antibiotics ongoing including Vanc/Zosyn -we will discuss with ID once cultures resolved for de-escalation and duration recommendations   Intractable nausea and vomiting with elevated liver chemistries - Likely secondary to above sepsis  - RUQ - 10 mm gallbladder polyp seen - RUQ benign on exam; labs downtrending with treatment of sepsis   Constipation, POA  -Patient reports 7 days of constipation, now worsened in the setting of narcotics -Increase MiraLAX and Senokot  Pain induced hypertension  Improved with pain control   Hypokalemia Mild, repleted    Hyperglycemia No oral hypo-glycemic's seen on his list of home medication A1c 5.9  OSA on BiPAP Resume BiPAP  nightly  DVT prophylaxis: Subcu Lovenox daily Code Status: Full code Family Communication: At bedside  Status is: inpt  Dispo: The patient is from: Home              Anticipated d/c is to: Home versus SNF pending clinical course              Anticipated d/c date is: 72+ hours              Patient currently not medically stable for discharge  Consultants:  Neurosurgery, interventional radiology  Procedures:  Possible abscess aspiration/washout 01/13/2021  Antimicrobials:  Vancomycin, Zosyn 01/10/2021, ongoing  Subjective: No acute issues or events overnight, nervously awaiting surgery  Objective: Vitals:   01/13/21 0619 01/13/21 0645 01/13/21 0646 01/13/21 0713  BP: (!) 169/82   (!) 143/79  Pulse: (!) 130 (!) 125 (!) 125 (!) 123  Resp: (!) 27 (!) 24 16 20   Temp: (!) 101 F (38.3 C)     TempSrc: Oral     SpO2: 92% 94% 95% 93%  Weight:      Height:        Intake/Output Summary (Last 24 hours) at 01/13/2021 0716 Last data filed at 01/13/2021 0259 Gross per 24 hour  Intake 2568.66 ml  Output 1920 ml  Net 648.66 ml    Filed Weights   01/10/21 2226 01/11/21 0452 01/11/21 0551  Weight: 108 kg 69.1 kg 122.2 kg    Examination:  General:  Pleasantly resting in bed, No acute distress. HEENT:  Normocephalic atraumatic.  Sclerae nonicteric, noninjected.  Extraocular movements intact bilaterally. Neck:  Without mass or deformity.  Trachea is midline. Lungs:  Clear to auscultate bilaterally without rhonchi, wheeze, or rales.  Heart:  Regular rate and rhythm.  Without murmurs, rubs, or gallops. Abdomen:  Soft, nontender, nondistended.  Without guarding or rebound. Extremities: Without cyanosis, clubbing, edema, or obvious deformity. Vascular:  Dorsalis pedis and posterior tibial pulses palpable bilaterally. Skin:  Warm and dry, no erythema, no ulcerations.   Data Reviewed: I have personally reviewed following labs and imaging studies  CBC: Recent Labs  Lab  01/10/21 2218 01/11/21 0500 01/12/21 0436 01/13/21 0048  WBC 15.1* 15.9* 17.8* 20.3*  NEUTROABS 12.7*  --   --   --   HGB 13.1 12.1* 11.2* 11.2*  HCT 41.2 36.8* 34.9* 34.3*  MCV 87.1 85.6 87.5 84.9  PLT 173 177 213 253    Basic Metabolic Panel: Recent Labs  Lab 01/10/21 2218 01/11/21 0500 01/11/21 0507 01/12/21 0436 01/13/21 0048  NA 136 135  --  134* 135  K 3.4* 3.3*  --  3.9 3.5  CL 100 103  --  101 100  CO2 27 24  --  24 25  GLUCOSE 167* 163*  --  109* 121*  BUN 24* 20  --  20 21  CREATININE 0.99 0.94  --  0.98 0.91  CALCIUM 8.0* 7.7*  --  7.7* 7.7*  MG  --   --  1.9  --   --   PHOS  --   --  2.7  --   --     GFR: Estimated Creatinine Clearance: 94.6 mL/min (by C-G formula based on SCr of 0.91 mg/dL). Liver Function Tests: Recent Labs  Lab 01/10/21 2218 01/11/21 0500 01/12/21 0436 01/13/21 0048  AST 163* 122* 90* 131*  ALT 112* 103* 78* 101*  ALKPHOS 108 103 98 94  BILITOT 1.5* 1.9* 1.7* 1.7*  PROT 5.9* 5.5* 5.1* 5.3*  ALBUMIN 2.4* 2.3* 2.0* 2.0*    Recent Labs  Lab 01/10/21 2218  LIPASE 28    No results for input(s): AMMONIA in the last 168 hours. Coagulation Profile: No results for input(s): INR, PROTIME in the last 168 hours. Cardiac Enzymes: No results for input(s): CKTOTAL, CKMB, CKMBINDEX, TROPONINI in the last 168 hours. BNP (last 3 results) No results for input(s): PROBNP in the last 8760 hours. HbA1C: Recent Labs    01/11/21 0500  HGBA1C 5.9*    CBG: Recent Labs  Lab 01/12/21 1213 01/12/21 1603 01/12/21 2001 01/13/21 0015 01/13/21 0342  GLUCAP 123* 122* 157* 122* 124*    Lipid Profile: No results for input(s): CHOL, HDL, LDLCALC, TRIG, CHOLHDL, LDLDIRECT in the last 72 hours. Thyroid Function Tests: No results for input(s): TSH, T4TOTAL, FREET4, T3FREE, THYROIDAB in the last 72 hours. Anemia Panel: No results for input(s): VITAMINB12, FOLATE, FERRITIN, TIBC, IRON, RETICCTPCT in the last 72 hours. Sepsis Labs: Recent  Labs  Lab 01/10/21 2218 01/11/21 0500 01/12/21 0436  PROCALCITON  --  1.61 2.09  LATICACIDVEN 1.4  --   --      Recent Results (from the past 240 hour(s))  Blood culture (routine x 2)     Status: None (Preliminary result)   Collection Time: 01/10/21 10:18 PM   Specimen: BLOOD LEFT FOREARM  Result Value Ref Range Status   Specimen Description BLOOD LEFT FOREARM  Final   Special Requests   Final    BOTTLES DRAWN AEROBIC AND ANAEROBIC Blood Culture adequate volume   Culture   Final    NO GROWTH 3 DAYS Performed at [facility] Lab, [address] [place], [place] [address]    Report Status PENDING  Incomplete  Blood culture (routine x 2)     Status: None (Preliminary result)   Collection Time: 01/10/21 10:18 PM   Specimen: BLOOD  Result Value Ref Range Status   Specimen Description BLOOD RIGHT ANTECUBITAL  Final   Special Requests   Final    BOTTLES DRAWN AEROBIC AND ANAEROBIC Blood Culture adequate volume   Culture   Final    NO GROWTH 2 DAYS Performed at [facility], [address] [place], [place] [address]    Report Status PENDING  Incomplete  Aerobic/Anaerobic Culture w Gram Stain (surgical/deep wound)     Status: None (Preliminary result)   Collection Time: 01/11/21  3:02 PM   Specimen: Wound  Result Value Ref Range Status   Specimen Description WOUND  Final   Special Requests ABSCESS OF BACK  Final   Gram Stain   Final    FEW WBC PRESENT, PREDOMINANTLY PMN NO ORGANISMS SEEN    Culture   Final    RARE GRAM NEGATIVE RODS IDENTIFICATION AND SUSCEPTIBILITIES TO FOLLOW Performed at [facility], [address], [place], [place] [address]    Report Status PENDING  Incomplete         Radiology Studies: No results found.   Scheduled Meds:  insulin aspart  0-15 Units Subcutaneous Q4H   polyethylene glycol  17 g Oral BID   senna-docusate  1 tablet Oral BID   Continuous Infusions:  piperacillin-tazobactam (ZOSYN)  IV 3.375 g (01/13/21 0505)    vancomycin 1,500 mg (01/12/21 1500)     LOS: 2 days   Time spent:  Azucena Fallen, DO [facility]  If 7PM-7AM, please contact night-coverage [email or web address]  01/13/2021, 7:16 AM

## 2021-01-13 NOTE — Progress Notes (Signed)
Pt came back to rm 3 from PACU. Reinitiated tele. VSS. Call bell within reach.   Lawson Radar, RN

## 2021-01-13 NOTE — Progress Notes (Signed)
Pt self administers CPAP. 

## 2021-01-13 NOTE — Progress Notes (Signed)
Called report. Performed COVID 19 swab and CHG wipe. Consent form obtained.   Lawson Radar, RN

## 2021-01-13 NOTE — Consult Note (Signed)
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 He has not had a fever since 7/18 though he has a worsening leukocytosis albeit in the setting of recent surgery. He reports his back pain is somewhat improved. His blood cultures remain negative x4 and his abscess aspirate culture grew E. Coli. OR fluid cultures from 7/19 are pending.    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 Will discontinue vancomycin for now as MRSA was not isolated. Continue piperacillin/tazobactam to cover for anaerobes. Will consider narrowing in 4-5 d. Patient will likely need 6 weeks of antibiotic therapy.   Active Problems:   Abscess of back   Vertebral osteomyelitis (HCC)   Hardware complicating wound infection (HCC)   Scheduled Meds:  cholecalciferol  1,000 Units Oral Daily   diclofenac  75 mg Oral BID   multivitamin with minerals  1 tablet Oral Daily   polyethylene glycol  17 g Oral BID   senna-docusate  1 tablet Oral BID   Continuous Infusions:  piperacillin-tazobactam (ZOSYN)  IV Stopped (01/13/21 0855)   PRN Meds:.acetaminophen, hydrALAZINE, HYDROmorphone (DILAUDID) injection, melatonin, oxyCODONE  HPI: Marco Kennedy is a 70 y.o. male with a PMH of chronic lower back pain s/p  Multiple fusions of the lumbar spine 2021 and OSA on BiPAP. He presents to [facility] after a CT of the lumbar spine at outside hospital showed an abscess near pelvic hardware.   Patient states that ~1 week prior to admission he developed L flank pain. He does not recall any inciting events. During this time he had some associated nausea, vomiting, generalized weakness, subjective fevers, chills, and sweats. The patient felt  that this was related to a COVID infection and decided to see if his symptoms would improve. He took multiple COVID tests that were negative. About 5 days after the onset of his symptoms he began feeling worse with increased back prompting him to present to the ED in [place], [place]. There a CT Lumbar was obtained which showed an abscess near patients pelvic hardware. Given his neurosurgeon is here at [facility] the patient was transferred. On admission patient was febrile to 100.7 F with a leukocytosis to 15.9. Repeat imaging (MRI lumbar) showed ventral epidural abscess in the thoracic spine from T7 to T10-11 and suspicion of infectious discitis. He was also noted to have solitary 3cm posterior paraspinal fluid collection around T12 and L1.   Since admission patient has had a fever up to 102.77F (7/18). He underwent abscess aspiration which grew GNR which speciated to E. Coli. He was also taken to the OR with Dr. Venetia Maxon for I&D. Minimal fluid collection was observed and sampled. He was started on vancomycin and zosyn.   Patient denies dysuria (notes urinary frequency that preceded his acute flank pain) or recent instrumentation of the GU tract.   Review of Systems: Negative except per above.   Past Medical History:  Diagnosis Date   Arthritis    arthritis-bilateral knees, left Hip.   Degenerative lumbar spinal stenosis    EKG abnormality    06-08-16- being evaluated by cardiologist  in [place]   Idiopathic scoliosis of lumbar region    Joint stiffness  Knee pain    Leg swelling    Low back pain    Achy, shooting pain.  Hurts to Walk or stand up straight.   Lumbar radiculopathy    Scoliosis concern    Sleep apnea    Bipap use nightly 25/19 -3l/m oxygen   Spondylolisthesis of lumbar region    Transfusion [place] bleeding post colon polyp removal and colonscopy procedure    Social History   Tobacco Use   Smoking status: Never   Smokeless tobacco: Never  Vaping Use    Vaping Use: Never used  Substance Use Topics   Alcohol use: Yes    Comment: rare- social    Drug use: No    Family History  Problem Relation Age of Onset   Hypertension Mother    Arthritis Mother    Stomach cancer Father    Hypertension Brother    No Known Allergies  OBJECTIVE: Blood pressure 111/63, pulse 75, temperature 98 F (36.7 C), temperature source Oral, resp. rate 14, height 5\' 7"  (1.702 m), weight 122.2 kg, SpO2 91 %.  Physical Exam Constitutional:      Appearance: Normal appearance. He is normal weight. He is not ill-appearing or toxic-appearing.  Eyes:     General: No scleral icterus.       Right eye: No discharge.        Left eye: No discharge.     Conjunctiva/sclera: Conjunctivae normal.  Cardiovascular:     Rate and Rhythm: Normal rate and regular rhythm.  Pulmonary:     Effort: Pulmonary effort is normal.     Breath sounds: Normal breath sounds.  Abdominal:     General: Bowel sounds are normal.     Palpations: Abdomen is soft.  Musculoskeletal:        General: Swelling present.  Skin:    General: Skin is warm and dry.    Lab Results Lab Results  Component Value Date   WBC 20.3 (H) 01/13/2021   HGB 11.2 (L) 01/13/2021   HCT 34.3 (L) 01/13/2021   MCV 84.9 01/13/2021   PLT 253 01/13/2021    Lab Results  Component Value Date   CREATININE 0.91 01/13/2021   BUN 21 01/13/2021   NA 135 01/13/2021   K 3.5 01/13/2021   CL 100 01/13/2021   CO2 25 01/13/2021    Lab Results  Component Value Date   ALT 101 (H) 01/13/2021   AST 131 (H) 01/13/2021   ALKPHOS 94 01/13/2021   BILITOT 1.7 (H) 01/13/2021     Microbiology: Recent Results (from the past 240 hour(s))  Blood culture (routine x 2)     Status: None (Preliminary result)   Collection Time: 01/10/21 10:18 PM   Specimen: BLOOD LEFT FOREARM  Result Value Ref Range Status   Specimen Description BLOOD LEFT FOREARM  Final   Special Requests   Final    BOTTLES DRAWN AEROBIC AND ANAEROBIC Blood  Culture adequate volume   Culture   Final    NO GROWTH 3 DAYS Performed at [facility], [address] [place], [place] [id number]    Report Status PENDING  Incomplete  Blood culture (routine x 2)     Status: None (Preliminary result)   Collection Time: 01/10/21 10:18 PM   Specimen: BLOOD  Result Value Ref Range Status   Specimen Description BLOOD RIGHT ANTECUBITAL  Final   Special Requests   Final    BOTTLES DRAWN AEROBIC AND ANAEROBIC  Blood Culture adequate volume   Culture   Final    NO GROWTH 2 DAYS Performed at [facility], [address] [place], [place] [address]    Report Status PENDING  Incomplete  Aerobic/Anaerobic Culture w Gram Stain (surgical/deep wound)     Status: None (Preliminary result)   Collection Time: 01/11/21  3:02 PM   Specimen: Wound  Result Value Ref Range Status   Specimen Description WOUND  Final   Special Requests ABSCESS OF BACK  Final   Gram Stain   Final    FEW WBC PRESENT, PREDOMINANTLY PMN NO ORGANISMS SEEN Performed at [facility], [address] [place], [place] [address]    Culture   Final    RARE ESCHERICHIA COLI NO ANAEROBES ISOLATED; CULTURE IN PROGRESS FOR 5 DAYS    Report Status PENDING  Incomplete   Organism ID, Bacteria ESCHERICHIA COLI  Final      Susceptibility   Escherichia coli - MIC*    AMPICILLIN <=2 SENSITIVE Sensitive     CEFAZOLIN <=4 SENSITIVE Sensitive     CEFEPIME <=0.12 SENSITIVE Sensitive     CEFTAZIDIME <=1 SENSITIVE Sensitive     CEFTRIAXONE <=0.25 SENSITIVE Sensitive     CIPROFLOXACIN <=0.25 SENSITIVE Sensitive     GENTAMICIN <=1 SENSITIVE Sensitive     IMIPENEM <=0.25 SENSITIVE Sensitive     TRIMETH/SULFA <=20 SENSITIVE Sensitive     AMPICILLIN/SULBACTAM <=2 SENSITIVE Sensitive     PIP/TAZO <=4 SENSITIVE Sensitive     * RARE ESCHERICHIA COLI  SARS Coronavirus 2 by RT PCR (hospital order, performed in [facility] hospital lab) Nasopharyngeal Nasopharyngeal Swab     Status: None    Collection Time: 01/13/21  7:53 AM   Specimen: Nasopharyngeal Swab  Result Value Ref Range Status   SARS Coronavirus 2 NEGATIVE NEGATIVE Final    Comment: (NOTE) SARS-CoV-2 target nucleic acids are NOT DETECTED.  The SARS-CoV-2 RNA is generally detectable in upper and lower respiratory specimens during the acute phase of infection. The lowest concentration of SARS-CoV-2 viral copies this assay can detect is 250 copies / mL. A negative result does not preclude SARS-CoV-2 infection and should not be used as the sole basis for treatment or other patient management decisions.  A negative result may occur with improper specimen collection / handling, submission of specimen other than nasopharyngeal swab, presence of viral mutation(s) within the areas targeted by this assay, and inadequate number of viral copies (<250 copies / mL). A negative result must be combined with clinical observations, patient history, and epidemiological information.  Fact Sheet for Patients:   BoilerBrush.com.cy  Fact Sheet for Healthcare Providers: https://pope.com/  This test is not yet approved or  cleared by the [place] FDA and has been authorized for detection and/or diagnosis of SARS-CoV-2 by FDA under an Emergency Use Authorization (EUA).  This EUA will remain in effect (meaning this test can be used) for the duration of the COVID-19 declaration under Section 564(b)(1) of the Act, 21 U.S.C. section 360bbb-3(b)(1), unless the authorization is terminated or revoked sooner.  Performed at [facility], [address] [place], [place] [address]   Aerobic/Anaerobic Culture w Gram Stain (surgical/deep wound)     Status: None (Preliminary result)   Collection Time: 01/13/21  9:48 AM   Specimen: PATH Other; Tissue  Result Value Ref Range Status   Specimen Description WOUND  Final   Special Requests LEFT THORACOLUMBAR SPEC A  Final   Gram Stain  Final    NO WBC SEEN NO ORGANISMS SEEN Performed at [facility], [address] [place], [place] [address]    Culture PENDING  Incomplete   Report Status PENDING  Incomplete  Aerobic/Anaerobic Culture w Gram Stain (surgical/deep wound)     Status: None (Preliminary result)   Collection Time: 01/13/21  9:55 AM   Specimen: PATH Other; Tissue  Result Value Ref Range Status   Specimen Description TISSUE  Final   Special Requests LEFT THORACOLUMBAR  Final   Gram Stain   Final    RARE WBC PRESENT, PREDOMINANTLY MONONUCLEAR NO ORGANISMS SEEN Performed at [facility], [address], [place], [place] [address]    Culture PENDING  Incomplete   Report Status PENDING  Incomplete  Aerobic/Anaerobic Culture w Gram Stain (surgical/deep wound)     Status: None (Preliminary result)   Collection Time: 01/13/21  9:57 AM   Specimen: PATH Other; Tissue  Result Value Ref Range Status   Specimen Description WOUND  Final   Special Requests LEFT THORACOLUMBAR SPEC C  Final   Gram Stain   Final    NO WBC SEEN NO ORGANISMS SEEN Performed at [facility], [address] [place], [place] [address]    Culture PENDING  Incomplete   Report Status PENDING  Incomplete  Aerobic/Anaerobic Culture w Gram Stain (surgical/deep wound)     Status: None (Preliminary result)   Collection Time: 01/13/21  9:58 AM   Specimen: PATH Other; Tissue  Result Value Ref Range Status   Specimen Description WOUND  Final   Special Requests LEFT THORACOLUMBAR SPEC B  Final   Gram Stain   Final    NO WBC SEEN NO ORGANISMS SEEN Performed at [facility], [address], [place], [place] [address]    Culture PENDING  Incomplete   Report Status PENDING  Incomplete    Michelle Piper, MD PGY-2 Internal Medicine  [phone] pager  01/13/2021, 2:58 PM

## 2021-01-13 NOTE — Op Note (Signed)
01/13/2021  10:49 AM  PATIENT:  Marco Kennedy  70 y.o. male  PRE-OPERATIVE DIAGNOSIS:  infected thoracolumbar wound  POST-OPERATIVE DIAGNOSIS:  infected thoracolumbar wound  PROCEDURE:  Procedure(s): THORACOLUMBAR WOUND DEBRIDEMENT (N/A)  SURGEON:  Surgeon(s) and Role:    [name], MD - Primary  PHYSICIAN ASSISTANT: Julien Girt, NP  ASSISTANTS: Poteat, RN   ANESTHESIA:   general  EBL:  50 mL   BLOOD ADMINISTERED:none  DRAINS: (Medium) Hemovact drain(s) in the peri-hardware  with  Suction Open   LOCAL MEDICATIONS USED:  MARCAINE    and LIDOCAINE   SPECIMEN:  Source of Specimen:  Cultures from fluid around hardware  DISPOSITION OF SPECIMEN:  Microbiology  COUNTS:  YES  TOURNIQUET:  * No tourniquets in log *  DICTATION: DICTATION:   INDICATIONS:  Patient is 70 year old man who underwent thoracolumbar fusion 9 months ago.  He developed a fever and imaging showed fluid around hardware.  IR performed aspiration of this fluid collection, which was positive for GNR.   it was elected to take him to surgery to explore his wound and drain abscesses.    PROCEDURE: Patient was brought to the OR.  Following induction of anesthesia, he was placed in a prone position on chest rolls.  His back was prepped with betadine scrub and Duraprep.  Local lidocaine was injected. The wound was explored and carried to the right sided hardware where thin fluid collection was identified, cultured and drained.  A much smaller amount of fluid was sampled and drained on the left as well.  I elected not to open entire wound and did not see other areas of concern.  There was minimal tissue necrosis on the right.  The wound was excised, then irrigated with saline and vancomycin after cultures were obtained.  A medium Hemovac drain was placed.  The edges were then closed with 2-0 and 3-0 Vicryl sutures.   Sterile occlusive dressings were placed with Dermabond and honeycomb dressings.  The patient was  extubated in the OR and taken to PACU in stable condition.   PLAN OF CARE: Admit to inpatient   PATIENT DISPOSITION:  PACU - hemodynamically stable.   Delay start of Pharmacological VTE agent (>24hrs) due to surgical blood loss or risk of bleeding: yes

## 2021-01-13 NOTE — Consult Note (Signed)
Please see PGY2 Carter's note for detail  70 yo male extensive thoracolumbar back hardware since 03/2020 admitted with 1 week of fever/shaking chill and thoracic back pain found to have hardware associated thoracic osteomyelitis/abscess.   No recent instrumentation No uti sx or recent illness S/p aspiration and cx ecoli S/p I&D 7/18; no hardware removed  Exam: Dressing c/d/I; drain catheter with minimal serosanguinous fluid Intact neurological function LE   A/p: Hardware associated late onset vertebral OM/abscess s/p I&D 7/18 Ecoli on abscess aspirate  -stop vanc -continue piptazo -f/u final culture result -discussed with primary team   I spent 60 minute reviewing data/chart, and coordinating care and >50% direct face to face time providing counseling/discussing diagnostics/treatment plan with patient         Raymondo Band, MD [facility] [phone]  pager   [phone] cell 01/13/2021, 2:31 PM

## 2021-01-13 NOTE — Progress Notes (Signed)
   01/13/21 0619  Assess: MEWS Score  Temp (!) 101 F (38.3 C)  BP (!) 169/82  Pulse Rate (!) 130  ECG Heart Rate (!) 130  Resp (!) 27  Level of Consciousness Alert  SpO2 92 %  O2 Device Nasal Cannula  Assess: MEWS Score  MEWS Temp 1  MEWS Systolic 0  MEWS Pulse 3  MEWS RR 2  MEWS LOC 0  MEWS Score 6  MEWS Score Color Red  Assess: if the MEWS score is Yellow or Red  Were vital signs taken at a resting state? Yes  Focused Assessment Change from prior assessment (see assessment flowsheet)  Early Detection of Sepsis Score *See Row Information* Medium  MEWS guidelines implemented *See Row Information* Yes  Treat  MEWS Interventions  (Md notified)  Take Vital Signs  Increase Vital Sign Frequency  Red: Q 1hr X 4 then Q 4hr X 4, if remains red, continue Q 4hrs  Escalate  MEWS: Escalate Red: discuss with charge nurse/RN and provider, consider discussing with RRT  Notify: Charge Nurse/RN  Name of Charge Nurse/RN Notified sharon rn  Date Charge Nurse/RN Notified 01/13/21  Time Charge Nurse/RN Notified 1740  Notify: Provider  Provider Name/Title Chotiner MD  Date Provider Notified 01/13/21  Time Provider Notified 0630  Notification Type Page  Notification Reason Change in status (pt has chills and fever 101.0)  Provider response  (awaiting call back)  Patient c/o chills, temp of 101.0 see flowsheet. Warm blankets provided, patient denies SOB, CP, no tylenol on EMAR, Chotiner MD notified, Natale Milch MD notified @0700  of patient's status, no new orders received will continue to monitor.

## 2021-01-13 NOTE — Brief Op Note (Signed)
01/13/2021  10:49 AM  PATIENT:  Marco Kennedy  70 y.o. male  PRE-OPERATIVE DIAGNOSIS:  infected thoracolumbar wound  POST-OPERATIVE DIAGNOSIS:  infected thoracolumbar wound  PROCEDURE:  Procedure(s): THORACOLUMBAR WOUND DEBRIDEMENT (N/A)  SURGEON:  Surgeon(s) and Role:    * Delontae Lamm, MD - Primary  PHYSICIAN ASSISTANT: McDaniel, NP  ASSISTANTS: Poteat, RN   ANESTHESIA:   general  EBL:  50 mL   BLOOD ADMINISTERED:none  DRAINS: (Medium) Hemovact drain(s) in the peri-hardware  with  Suction Open   LOCAL MEDICATIONS USED:  MARCAINE    and LIDOCAINE   SPECIMEN:  Source of Specimen:  Cultures from fluid around hardware  DISPOSITION OF SPECIMEN:  Microbiology  COUNTS:  YES  TOURNIQUET:  * No tourniquets in log *  DICTATION: DICTATION:   INDICATIONS:  Patient is 70 year old man who underwent thoracolumbar fusion 9 months ago.  He developed a fever and imaging showed fluid around hardware.  IR performed aspiration of this fluid collection, which was positive for GNR.   it was elected to take him to surgery to explore his wound and drain abscesses.    PROCEDURE: Patient was brought to the OR.  Following induction of anesthesia, he was placed in a prone position on chest rolls.  His back was prepped with betadine scrub and Duraprep.  Local lidocaine was injected. The wound was explored and carried to the right sided hardware where thin fluid collection was identified, cultured and drained.  A much smaller amount of fluid was sampled and drained on the left as well.  I elected not to open entire wound and did not see other areas of concern.  There was minimal tissue necrosis on the right.  The wound was excised, then irrigated with saline and vancomycin after cultures were obtained.  A medium Hemovac drain was placed.  The edges were then closed with 2-0 and 3-0 Vicryl sutures.   Sterile occlusive dressings were placed with Dermabond and honeycomb dressings.  The patient was  extubated in the OR and taken to PACU in stable condition.   PLAN OF CARE: Admit to inpatient   PATIENT DISPOSITION:  PACU - hemodynamically stable.   Delay start of Pharmacological VTE agent (>24hrs) due to surgical blood loss or risk of bleeding: yes  

## 2021-01-13 NOTE — Anesthesia Preprocedure Evaluation (Addendum)
Anesthesia Evaluation  Patient identified by MRN, date of birth, ID band Patient awake    Reviewed: Allergy & Precautions, NPO status , Patient's Chart, lab work & pertinent test results  Airway Mallampati: IV  TM Distance: >3 FB Neck ROM: Full    Dental no notable dental hx.    Pulmonary sleep apnea and Continuous Positive Airway Pressure Ventilation ,    Pulmonary exam normal breath sounds clear to auscultation       Cardiovascular negative cardio ROS Normal cardiovascular exam Rhythm:Regular Rate:Normal  ECG: SR, rate 82   Neuro/Psych  Neuromuscular disease negative psych ROS   GI/Hepatic negative GI ROS, Neg liver ROS,   Endo/Other  Morbid obesity  Renal/GU negative Renal ROS     Musculoskeletal  (+) Arthritis ,   Abdominal (+) + obese,   Peds  Hematology  (+) anemia ,   Anesthesia Other Findings Infected lumbar wound  Reproductive/Obstetrics                            Anesthesia Physical Anesthesia Plan  ASA: 3  Anesthesia Plan: General   Post-op Pain Management:    Induction: Intravenous  PONV Risk Score and Plan: 2 and Ondansetron, Dexamethasone and Treatment may vary due to age or medical condition  Airway Management Planned: Oral ETT  Additional Equipment:   Intra-op Plan:   Post-operative Plan: Extubation in OR  Informed Consent: I have reviewed the patients History and Physical, chart, labs and discussed the procedure including the risks, benefits and alternatives for the proposed anesthesia with the patient or authorized representative who has indicated his/her understanding and acceptance.     Dental advisory given  Plan Discussed with: CRNA  Anesthesia Plan Comments:        Anesthesia Quick Evaluation

## 2021-01-13 NOTE — Progress Notes (Signed)
Pt went off floor for surgery. Passed the report to the short stay nurse about the VS. Was not able to take the 3rd and 4th VS per protocol.   Lawson Radar, RN

## 2021-01-13 NOTE — Interval H&P Note (Signed)
History and Physical Interval Note:  01/13/2021 7:19 AM  Marco Kennedy  has presented today for surgery, with the diagnosis of infected lumbar wound.  The various methods of treatment have been discussed with the patient and family. After consideration of risks, benefits and other options for treatment, the patient has consented to  Procedure(s): THORACOLUMBAR WOUND DEBRIDEMENT (N/A) as a surgical intervention.  The patient's history has been reviewed, patient examined, no change in status, stable for surgery.  I have reviewed the patient's chart and labs.  Questions were answered to the patient's satisfaction.     [name]

## 2021-01-13 NOTE — Evaluation (Signed)
Physical Therapy Evaluation Patient Details Name: Marco Kennedy MRN: [id number] DOB: 05-Nov-1950 Today's Date: 01/13/2021   History of Present Illness  70 y/o male admitted 7/16 with a pelvic abscess, presenting with worsening L flank pain ongoing for a week. 7/17 aspiration. 7/19 thoracolumbar wound debridement. PMH: anterior and lateral lumbar fusion w/ pelvic fixation, OSA on BiPAP, THA  Clinical Impression  Pt demonstrated increased difficulty with bed mobility, requiring max A +2 physical assistance. Brought from room air to 3LPM during ambulation to maintain sats >90%. Pt demonstrates decreased bed mobility, transfers, and activity tolerance, and will benefit from PT to improve function. Recommend continuation of PT to improve bed mobility and function. Recommend pt for [facility] upon discharge, pending whether pt improves bed mobility.     Follow Up Recommendations Home health PT    Equipment Recommendations  None recommended by PT    Recommendations for Other Services       Precautions / Restrictions Precautions Precautions: Fall Restrictions Weight Bearing Restrictions: No      Mobility  Bed Mobility Overal bed mobility: Needs Assistance Bed Mobility: Supine to Sit     Supine to sit: Max assist;+2 for physical assistance     General bed mobility comments: Pt in bed with upon arrival on 4LPM of O2. O2 brought to room air, as pt did not use O2 at home. Required max A +2 physical assitance to bring LEs off bed and elevate trunk. Verbal and tactile cues provided for hand placement in sidelying. Pt desat to 86% at EOB, given O2 at 1LPM.    Transfers Overall transfer level: Needs assistance   Transfers: Sit to/from Stand Sit to Stand: Min assist;From elevated surface         General transfer comment: min A to power up to standing.  Ambulation/Gait Ambulation/Gait assistance: Min guard Gait Distance (Feet): 120 Feet Assistive device: Rolling walker (2 wheeled) Gait  Pattern/deviations: Step-through pattern;Trunk flexed   Gait velocity interpretation: >2.62 ft/sec, indicative of community ambulatory General Gait Details: Demonstrated trunk flexion, forward head, rounded shoulders during ambulation. Pt desat during ambulation multiple times. Increased O2 from 1-3LPM. Pt left in recliner with O2 at 3LPM.  Stairs            Wheelchair Mobility    Modified Rankin (Stroke Patients Only)       Balance Overall balance assessment: Needs assistance Sitting-balance support: Feet supported;Single extremity supported Sitting balance-Leahy Scale: Fair                                       Pertinent Vitals/Pain Pain Assessment: 0-10 Pain Score: 4  Pain Descriptors / Indicators: Grimacing Pain Intervention(s): Monitored during session;Repositioned    Home Living Family/patient expects to be discharged to:: Private residence Living Arrangements: Spouse/significant other Available Help at Discharge: Family Type of Home: House Home Access: Stairs to enter Entrance Stairs-Rails: Right;Can reach both (when set of stairs on R second set both) Entrance Stairs-Number of Steps: approximately 14 total (6-7 steps followed by a walkway then 6-7 additional steps) Home Layout: Two level Home Equipment: Shower seat;Bedside commode;Walker - 2 wheels;Cane - single point Additional Comments: has walker and cane, but does not use as oftern prior to this admission    Prior Function Level of Independence: Needs assistance      ADL's / Homemaking Assistance Needed: assistance with getting dressed        Hand  Dominance   Dominant Hand: Right    Extremity/Trunk Assessment                Communication   Communication: No difficulties  Cognition Arousal/Alertness: Awake/alert Behavior During Therapy: WFL for tasks assessed/performed Overall Cognitive Status: Within Functional Limits for tasks assessed                                  General Comments: Pt accurately responded to commands      General Comments      Exercises     Assessment/Plan    PT Assessment Patient needs continued PT services  PT Problem List Decreased strength;Decreased mobility;Decreased activity tolerance       PT Treatment Interventions Gait training;Stair training;Functional mobility training;Therapeutic activities    PT Goals (Current goals can be found in the Care Plan section)  Acute Rehab PT Goals Patient Stated Goal: Return home PT Goal Formulation: With patient Time For Goal Achievement: 01/27/21 Potential to Achieve Goals: Good    Frequency Min 3X/week   Barriers to discharge        Co-evaluation               AM-PAC PT "6 Clicks" Mobility  Outcome Measure Help needed turning from your back to your side while in a flat bed without using bedrails?: Total Help needed moving from lying on your back to sitting on the side of a flat bed without using bedrails?: Total Help needed moving to and from a bed to a chair (including a wheelchair)?: A Little Help needed standing up from a chair using your arms (e.g., wheelchair or bedside chair)?: A Little Help needed to walk in hospital room?: A Little Help needed climbing 3-5 steps with a railing? : A Lot 6 Click Score: 13    End of Session Equipment Utilized During Treatment: Gait belt;Oxygen Activity Tolerance: Patient tolerated treatment well Patient left: in chair;with call bell/phone within reach;with chair alarm set;with family/visitor present Nurse Communication: Mobility status PT Visit Diagnosis: Muscle weakness (generalized) (M62.81);Other abnormalities of gait and mobility (R26.89)    Time: 3557-3220 PT Time Calculation (min) (ACUTE ONLY): 41 min   Charges:   PT Evaluation $PT Eval Moderate Complexity: 1 Mod PT Treatments $Gait Training: 8-22 mins $Therapeutic Activity: 8-22 mins        Velda Shell, SPT Acute Rehab: [phone]  [phone]   Vance Gather 01/13/2021, 2:32 PM

## 2021-01-13 NOTE — Progress Notes (Signed)
Arrived to patient's room. Patient is currently sitting up in chair. Secure chatted nurse to reconsult once patient returns to bed and ready for [facility] to assess for placement. Tomasita Morrow, RN [facility]

## 2021-01-14 ENCOUNTER — Encounter (HOSPITAL_COMMUNITY): Payer: Self-pay | Admitting: Neurosurgery

## 2021-01-14 LAB — COMPREHENSIVE METABOLIC PANEL
ALT: 98 U/L — ABNORMAL HIGH (ref 0–44)
AST: 137 U/L — ABNORMAL HIGH (ref 15–41)
Albumin: 1.9 g/dL — ABNORMAL LOW (ref 3.5–5.0)
Alkaline Phosphatase: 87 U/L (ref 38–126)
Anion gap: 9 (ref 5–15)
BUN: 26 mg/dL — ABNORMAL HIGH (ref 8–23)
CO2: 27 mmol/L (ref 22–32)
Calcium: 7.7 mg/dL — ABNORMAL LOW (ref 8.9–10.3)
Chloride: 100 mmol/L (ref 98–111)
Creatinine, Ser: 1.12 mg/dL (ref 0.61–1.24)
GFR, Estimated: >60 mL/min (ref >60)
Glucose, Bld: 146 mg/dL — ABNORMAL HIGH (ref 70–99)
Potassium: 3.6 mmol/L (ref 3.5–5.1)
Sodium: 136 mmol/L (ref 135–145)
Total Bilirubin: 1.4 mg/dL — ABNORMAL HIGH (ref 0.3–1.2)
Total Protein: 4.9 g/dL — ABNORMAL LOW (ref 6.5–8.1)

## 2021-01-14 LAB — CBC
HCT: 33.2 % — ABNORMAL LOW (ref 39.0–52.0)
Hemoglobin: 10.8 g/dL — ABNORMAL LOW (ref 13.0–17.0)
MCH: 28.1 pg (ref 26.0–34.0)
MCHC: 32.5 g/dL (ref 30.0–36.0)
MCV: 86.2 fL (ref 80.0–100.0)
Platelets: 250 10*3/uL (ref 150–400)
RBC: 3.85 MIL/uL — ABNORMAL LOW (ref 4.22–5.81)
RDW: 15.2 % (ref 11.5–15.5)
WBC: 18.6 10*3/uL — ABNORMAL HIGH (ref 4.0–10.5)
nRBC: 0 % (ref 0.0–0.2)

## 2021-01-14 MED ORDER — MAGNESIUM CITRATE PO SOLN
1.0000 | Freq: Once | ORAL | Status: DC
  Filled 2021-01-14: qty 296

## 2021-01-14 MED ORDER — PEG 3350-KCL-NA BICARB-NACL 420 G PO SOLR
4000.0000 mL | Freq: Once | ORAL | Status: DC
  Filled 2021-01-14: qty 4000

## 2021-01-14 NOTE — Progress Notes (Signed)
PROGRESS NOTE    Marco Kennedy  QIO:[id number] DOB: 28-Mar-1951 DOA: 01/10/2021 PCP: [name], [name], MD   Brief Narrative:  Marco Marco Kennedy is a 70 y.o. male with medical history significant for back surgery with pelvis fixation in November 2021, OSA on BiPAP, who [facility] due to sudden onset progressively worsening left flank pain of 1 week duration.  Associated with nausea, vomiting, generalized weakness, subjective fevers and chills and sweats.  CT lumbar spine [facility] in [facility] revealed moderate size abscess located near spinal hardware.  Since his back surgery was done by Dr. Venetia MaxonStern, decision was made to transfer to [facility] for neurosurgical assessment.  Seen by neurosurgery who recommended hospitalist admission, they will see in consultation.  Assessment & Plan:   Active Problems:   Abscess of back   Vertebral osteomyelitis (HCC)   Hardware complicating wound infection (HCC)  Sepsis secondary to moderate size abscess located near the pelvic hardware, POA. Status post back surgery/hardware insertion - NeuroSx, ID, and interventional IR following, appreciate insight and recommendations -Status post wound debridement with neurosurgery 01/13/2021 -tolerated well - Pain well controlled - Fluid collection aspirated 01/11/2021, follow cultures - Broad-spectrum antibiotics ongoing with Zosyn only given E coli noted on initial cultures - Vancomycin discontinued per recommendations   Intractable nausea and vomiting with elevated liver chemistries, resolved - Likely secondary to above sepsis  - RUQ - 10 mm gallbladder polyp seen - RUQ benign on exam; labs downtrending with treatment of sepsis   Constipation, POA  -Patient reports 7 days of constipation, now worsened in the setting of narcotics -Increase MiraLAX and Senokot -to twice daily, mag citrate x1 dose today  Pain induced hypertension  Improved with pain control   Hypokalemia Mild,  repleted    Hyperglycemia Transient, A1c 5.9 Discontinue sliding scale insulin/hypoglycemic protocol  OSA on BiPAP Resume BiPAP nightly per home settings  DVT prophylaxis: Subcu Lovenox daily Code Status: Full code Family Communication: Wife present at bedside  Status is: inpt  Dispo: The patient is from: Home              Anticipated d/c is to: Home versus SNF pending clinical course              Anticipated d/c date is: 48-72 hours              Patient currently not medically stable for discharge  Consultants:  Neurosurgery, interventional radiology  Procedures:  Abscess aspiration 01/12/21 Thoracolumbar wound debridement 01/13/21  Antimicrobials:  Vancomycin 7/16-7/19 Zosyn 01/10/2021, ongoing  Subjective: No acute issues or events overnight, tolerated procedure quite well, continues to complain of constipation but pain is well controlled denies fevers chills nausea vomiting diarrhea chest pain shortness of breath.  Objective: Vitals:   01/13/21 1600 01/13/21 1949 01/13/21 2356 01/14/21 0442  BP: 123/77 (!) 147/65 135/61 123/63  Pulse: 70 89 64 (!) 50  Resp: 19 20 11 18   Temp: 98.2 F (36.8 C) 99.2 F (37.3 C) 98.8 F (37.1 C) 97.6 F (36.4 C)  TempSrc: Oral Oral Oral Oral  SpO2: 95% 90% 95% 95%  Weight:      Height:        Intake/Output Summary (Last 24 hours) at 01/14/2021 0736 Last data filed at 01/14/2021 0650 Gross per 24 hour  Intake 2315.67 ml  Output 605 ml  Net 1710.67 ml    Filed Weights   01/11/21 0452 01/11/21 0551 01/13/21 0837  Weight: 69.1 kg 122.2  kg 122.2 kg    Examination:  General:  Pleasantly resting in bed, No acute distress. HEENT:  Normocephalic atraumatic.  Sclerae nonicteric, noninjected.  Extraocular movements intact bilaterally. Neck:  Without mass or deformity.  Trachea is midline. Lungs:  Clear to auscultate bilaterally without rhonchi, wheeze, or rales. Heart:  Regular rate and rhythm.  Without murmurs, rubs, or  gallops. Abdomen:  Soft, nontender, nondistended.  Without guarding or rebound. Extremities: Without cyanosis, clubbing, edema, or obvious deformity. Vascular:  Dorsalis pedis and posterior tibial pulses palpable bilaterally. Skin:  Warm and dry, no erythema, no ulcerations -wound drain from thoracic surgical site, bandage clean dry intact   Data Reviewed: I have personally reviewed following labs and imaging studies  CBC: Recent Labs  Lab 01/10/21 2218 01/11/21 0500 01/12/21 0436 01/13/21 0048 01/14/21 0256  WBC 15.1* 15.9* 17.8* 20.3* 18.6*  NEUTROABS 12.7*  --   --   --   --   HGB 13.1 12.1* 11.2* 11.2* 10.8*  HCT 41.2 36.8* 34.9* 34.3* 33.2*  MCV 87.1 85.6 87.5 84.9 86.2  PLT 173 177 213 253 250    Basic Metabolic Panel: Recent Labs  Lab 01/10/21 2218 01/11/21 0500 01/11/21 0507 01/12/21 0436 01/13/21 0048 01/14/21 0256  NA 136 135  --  134* 135 136  K 3.4* 3.3*  --  3.9 3.5 3.6  CL 100 103  --  101 100 100  CO2 27 24  --  24 25 27   GLUCOSE 167* 163*  --  109* 121* 146*  BUN 24* 20  --  20 21 26*  CREATININE 0.99 0.94  --  0.98 0.91 1.12  CALCIUM 8.0* 7.7*  --  7.7* 7.7* 7.7*  MG  --   --  1.9  --   --   --   PHOS  --   --  2.7  --   --   --     GFR: Estimated Creatinine Clearance: 76.8 mL/min (by C-G formula based on SCr of 1.12 mg/dL). Liver Function Tests: Recent Labs  Lab 01/10/21 2218 01/11/21 0500 01/12/21 0436 01/13/21 0048 01/14/21 0256  AST 163* 122* 90* 131* 137*  ALT 112* 103* 78* 101* 98*  ALKPHOS 108 103 98 94 87  BILITOT 1.5* 1.9* 1.7* 1.7* 1.4*  PROT 5.9* 5.5* 5.1* 5.3* 4.9*  ALBUMIN 2.4* 2.3* 2.0* 2.0* 1.9*    Recent Labs  Lab 01/10/21 2218  LIPASE 28    No results for input(s): AMMONIA in the last 168 hours. Coagulation Profile: No results for input(s): INR, PROTIME in the last 168 hours. Cardiac Enzymes: No results for input(s): CKTOTAL, CKMB, CKMBINDEX, TROPONINI in the last 168 hours. BNP (last 3 results) No results  for input(s): PROBNP in the last 8760 hours. HbA1C: No results for input(s): HGBA1C in the last 72 hours.  CBG: Recent Labs  Lab 01/13/21 0015 01/13/21 0342 01/13/21 0749 01/13/21 1052 01/13/21 1158  GLUCAP 122* 124* 121* 126* 138*    Lipid Profile: No results for input(s): CHOL, HDL, LDLCALC, TRIG, CHOLHDL, LDLDIRECT in the last 72 hours. Thyroid Function Tests: No results for input(s): TSH, T4TOTAL, FREET4, T3FREE, THYROIDAB in the last 72 hours. Anemia Panel: No results for input(s): VITAMINB12, FOLATE, FERRITIN, TIBC, IRON, RETICCTPCT in the last 72 hours. Sepsis Labs: Recent Labs  Lab 01/10/21 2218 01/11/21 0500 01/12/21 0436  PROCALCITON  --  1.61 2.09  LATICACIDVEN 1.4  --   --      Recent Results (from the past 240 hour(s))  Blood culture (routine x 2)     Status: None (Preliminary result)   Collection Time: 01/10/21 10:18 PM   Specimen: BLOOD LEFT FOREARM  Result Value Ref Range Status   Specimen Description BLOOD LEFT FOREARM  Final   Special Requests   Final    BOTTLES DRAWN AEROBIC AND ANAEROBIC Blood Culture adequate volume   Culture   Final    NO GROWTH 4 DAYS Performed at [facility], [address] [place], [place] [address]    Report Status PENDING  Incomplete  Blood culture (routine x 2)     Status: None (Preliminary result)   Collection Time: 01/10/21 10:18 PM   Specimen: BLOOD  Result Value Ref Range Status   Specimen Description BLOOD RIGHT ANTECUBITAL  Final   Special Requests   Final    BOTTLES DRAWN AEROBIC AND ANAEROBIC Blood Culture adequate volume   Culture   Final    NO GROWTH 3 DAYS Performed at [facility], [address], [place], [place] [address]    Report Status PENDING  Incomplete  Aerobic/Anaerobic Culture w Gram Stain (surgical/deep wound)     Status: None (Preliminary result)   Collection Time: 01/11/21  3:02 PM   Specimen: Wound  Result Value Ref Range Status   Specimen Description WOUND  Final    Special Requests ABSCESS OF BACK  Final   Gram Stain   Final    FEW WBC PRESENT, PREDOMINANTLY PMN NO ORGANISMS SEEN Performed at [facility], [address] [place], [place] [address]    Culture   Final    RARE ESCHERICHIA COLI NO ANAEROBES ISOLATED; CULTURE IN PROGRESS FOR 5 DAYS    Report Status PENDING  Incomplete   Organism ID, Bacteria ESCHERICHIA COLI  Final      Susceptibility   Escherichia coli - MIC*    AMPICILLIN <=2 SENSITIVE Sensitive     CEFAZOLIN <=4 SENSITIVE Sensitive     CEFEPIME <=0.12 SENSITIVE Sensitive     CEFTAZIDIME <=1 SENSITIVE Sensitive     CEFTRIAXONE <=0.25 SENSITIVE Sensitive     CIPROFLOXACIN <=0.25 SENSITIVE Sensitive     GENTAMICIN <=1 SENSITIVE Sensitive     IMIPENEM <=0.25 SENSITIVE Sensitive     TRIMETH/SULFA <=20 SENSITIVE Sensitive     AMPICILLIN/SULBACTAM <=2 SENSITIVE Sensitive     PIP/TAZO <=4 SENSITIVE Sensitive     * RARE ESCHERICHIA COLI  SARS Coronavirus 2 by RT PCR (hospital order, performed in [facility] hospital lab) Nasopharyngeal Nasopharyngeal Swab     Status: None   Collection Time: 01/13/21  7:53 AM   Specimen: Nasopharyngeal Swab  Result Value Ref Range Status   SARS Coronavirus 2 NEGATIVE NEGATIVE Final    Comment: (NOTE) SARS-CoV-2 target nucleic acids are NOT DETECTED.  The SARS-CoV-2 RNA is generally detectable in upper and lower respiratory specimens during the acute phase of infection. The lowest concentration of SARS-CoV-2 viral copies this assay can detect is 250 copies / mL. A negative result does not preclude SARS-CoV-2 infection and should not be used as the sole basis for treatment or other patient management decisions.  A negative result may occur with improper specimen collection / handling, submission of specimen other than nasopharyngeal swab, presence of viral mutation(s) within the areas targeted by this assay, and inadequate number of viral copies (<250 copies / mL). A negative result must be  combined with clinical observations, patient history, and epidemiological information.  Fact Sheet for Patients:   BoilerBrush.com.cy  Fact Sheet for Healthcare Providers: https://pope.com/  This test is not yet approved or  cleared by the [place] FDA and has been authorized for detection and/or diagnosis of SARS-CoV-2 by FDA under an Emergency Use Authorization (EUA).  This EUA will remain in effect (meaning this test can be used) for the duration of the COVID-19 declaration under Section 564(b)(1) of the Act, 21 U.S.C. section 360bbb-3(b)(1), unless the authorization is terminated or revoked sooner.  Performed at [facility], [address] [place], [place] [address]   Aerobic/Anaerobic Culture w Gram Stain (surgical/deep wound)     Status: None (Preliminary result)   Collection Time: 01/13/21  9:48 AM   Specimen: PATH Other; Tissue  Result Value Ref Range Status   Specimen Description WOUND  Final   Special Requests LEFT THORACOLUMBAR SPEC A  Final   Gram Stain   Final    NO WBC SEEN NO ORGANISMS SEEN Performed at [facility], [address], [place], [place] [address]    Culture PENDING  Incomplete   Report Status PENDING  Incomplete  Aerobic/Anaerobic Culture w Gram Stain (surgical/deep wound)     Status: None (Preliminary result)   Collection Time: 01/13/21  9:55 AM   Specimen: PATH Other; Tissue  Result Value Ref Range Status   Specimen Description TISSUE  Final   Special Requests LEFT THORACOLUMBAR  Final   Gram Stain   Final    RARE WBC PRESENT, PREDOMINANTLY MONONUCLEAR NO ORGANISMS SEEN Performed at [facility], [address], [place], [place] [address]    Culture PENDING  Incomplete   Report Status PENDING  Incomplete  Aerobic/Anaerobic Culture w Gram Stain (surgical/deep wound)     Status: None (Preliminary result)   Collection Time: 01/13/21  9:57 AM   Specimen: PATH Other; Tissue   Result Value Ref Range Status   Specimen Description WOUND  Final   Special Requests LEFT THORACOLUMBAR SPEC C  Final   Gram Stain   Final    NO WBC SEEN NO ORGANISMS SEEN Performed at [facility], [address] [place], [place] [address]    Culture PENDING  Incomplete   Report Status PENDING  Incomplete  Aerobic/Anaerobic Culture w Gram Stain (surgical/deep wound)     Status: None (Preliminary result)   Collection Time: 01/13/21  9:58 AM   Specimen: PATH Other; Tissue  Result Value Ref Range Status   Specimen Description WOUND  Final   Special Requests LEFT THORACOLUMBAR SPEC B  Final   Gram Stain   Final    NO WBC SEEN NO ORGANISMS SEEN Performed at [facility], [address], [place], [place] [address]    Culture PENDING  Incomplete   Report Status PENDING  Incomplete    Radiology Studies: No results found.   Scheduled Meds:  cholecalciferol  1,000 Units Oral Daily   diclofenac  75 mg Oral BID   multivitamin with minerals  1 tablet Oral Daily   polyethylene glycol  17 g Oral BID   senna-docusate  1 tablet Oral BID   Continuous Infusions:  piperacillin-tazobactam (ZOSYN)  IV 3.375 g (01/14/21 0649)     LOS: 3 days   Time spent:  Azucena Fallen, DO [facility]  If 7PM-7AM, please contact night-coverage [email or web address]  01/14/2021, 7:36 AM

## 2021-01-14 NOTE — Progress Notes (Signed)
Pt. Asked for sterile water for his home cpap. RT filled water chamber to the appropriate level. Pt. States he will notify when he is ready to place on his cpap.

## 2021-01-14 NOTE — Anesthesia Postprocedure Evaluation (Signed)
Anesthesia Post Note  Patient: Marco Kennedy  Procedure(s) Performed: Marco Kennedy WOUND DEBRIDEMENT     Patient location during evaluation: PACU Anesthesia Type: General Level of consciousness: awake Pain management: pain level controlled Vital Signs Assessment: post-procedure vital signs reviewed and stable Respiratory status: spontaneous breathing, nonlabored ventilation, respiratory function stable and patient connected to nasal cannula oxygen Cardiovascular status: blood pressure returned to baseline and stable Postop Assessment: no apparent nausea or vomiting Anesthetic complications: no   No notable events documented.  Last Vitals:  Vitals:   01/13/21 2356 01/14/21 0442  BP: 135/61 123/63  Pulse: 64 (!) 50  Resp: 11 18  Temp: 37.1 C 36.4 C  SpO2: 95% 95%    Last Pain:  Vitals:   01/14/21 0442  TempSrc: Oral  PainSc:                  Catheryn Bacon Grettell Ransdell

## 2021-01-14 NOTE — Evaluation (Signed)
Occupational Therapy Evaluation Patient Details Name: Marco Kennedy MRN: [id number] DOB: [birth year] Today's Date: 01/14/2021    History of Present Illness 70 y/o male admitted 7/16 with a pelvic abscess, presenting with worsening L flank pain ongoing for a week. 7/17 aspiration. 7/19 thoracolumbar wound debridement. PMH: anterior and lateral lumbar fusion w/ pelvic fixation, OSA on BiPAP, THA   Clinical Impression   Pt admitted with above. Pt normally lives with wife who can work from home. Pt with bed mobility required mod x2 with bed rail and from supine to sitting. Pt then was able to complete from elevated surface sit to stand to walker with min guard. Pt at this time was having loose bowels and required transfer to [facility] with min guard to min assist with use of RW and max to total assist for hygiene. Pt with transfers required pacing and breathing cues as o2 on room air ranged from 88-94%.  Pt currently with functional limitations due to the deficits listed below (see OT Problem List).  Pt will benefit from skilled OT to increase their safety and independence with ADL and functional mobility for ADL to facilitate discharge to venue listed below.      Follow Up Recommendations  Home health OT;Supervision/Assistance - 24 hour    Equipment Recommendations  3 in 1 bedside commode    Recommendations for Other Services       Precautions / Restrictions Precautions Precautions: Fall Restrictions Weight Bearing Restrictions: No      Mobility Bed Mobility Overal bed mobility: Needs Assistance Bed Mobility: Supine to Sit     Supine to sit: Mod assist;+2 for physical assistance;+2 for safety/equipment          Transfers Overall transfer level: Needs assistance Equipment used: Rolling walker (2 wheeled) Transfers: Sit to/from Stand Sit to Stand: Min guard;From elevated surface              Balance Overall balance assessment: Needs assistance Sitting-balance support:  Feet supported;Bilateral upper extremity supported Sitting balance-Leahy Scale: Fair                                     ADL either performed or assessed with clinical judgement   ADL Overall ADL's : Needs assistance/impaired Eating/Feeding: Independent;Sitting   Grooming: Wash/dry hands;Wash/dry face;Set up;Sitting   Upper Body Bathing: Minimal assistance;Sitting;Cueing for safety;Cueing for sequencing   Lower Body Bathing: Moderate assistance;Cueing for safety;Cueing for sequencing;Sit to/from stand   Upper Body Dressing : Minimal assistance;Cueing for safety;Cueing for sequencing;Sitting   Lower Body Dressing: Maximal assistance;Cueing for safety;Cueing for sequencing;Sit to/from stand   Toilet Transfer: Minimal assistance;Cueing for safety;Cueing for sequencing;BSC   Toileting- Clothing Manipulation and Hygiene: Cueing for safety;Cueing for sequencing;Sit to/from stand;Total assistance       Functional mobility during ADLs: Minimal assistance;Rolling walker;Cueing for safety;Cueing for sequencing       Vision         Perception     Praxis      Pertinent Vitals/Pain Pain Assessment: Faces Faces Pain Scale: Hurts little more Pain Descriptors / Indicators: Grimacing Pain Intervention(s): Monitored during session     Hand Dominance Right   Extremity/Trunk Assessment Upper Extremity Assessment Upper Extremity Assessment: RUE deficits/detail RUE Deficits / Details: Pt reported some discomfort with ROM but thinks it is from family attempting to lift pt at home prior to coming to ED   Lower Extremity Assessment Lower Extremity Assessment:  Defer to PT evaluation   Cervical / Trunk Assessment Cervical / Trunk Assessment: Kyphotic   Communication Communication Communication: No difficulties   Cognition Arousal/Alertness: Awake/alert Behavior During Therapy: WFL for tasks assessed/performed Overall Cognitive Status: Within Functional Limits for  tasks assessed                                 General Comments: Pt accurately responded to commands   General Comments       Exercises     Shoulder Instructions      Home Living Family/patient expects to be discharged to:: Private residence Living Arrangements: Spouse/significant other Available Help at Discharge: Family Type of Home: House Home Access: Stairs to enter [employer] of Steps: approximately 14 total (6-7 steps followed by a walkway then 6-7 additional steps) Entrance Stairs-Rails: Right;Can reach both Home Layout: Two level     Bathroom Shower/Tub: Chief Strategy Officer: Standard     Home Equipment: Shower seat;Bedside commode;Walker - 2 wheels;Cane - single point   Additional Comments: has walker and cane, but does not use as oftern prior to this admission      Prior Functioning/Environment Level of Independence: Needs assistance    ADL's / Homemaking Assistance Needed: assistance with getting dressed   Comments: pt reports use of RW or cane as needed, limited by pain         OT Problem List: Decreased strength;Decreased range of motion;Decreased activity tolerance;Impaired balance (sitting and/or standing);Decreased safety awareness;Cardiopulmonary status limiting activity      OT Treatment/Interventions: Self-care/ADL training;Therapeutic exercise;Energy conservation;DME and/or AE instruction;Therapeutic activities;Patient/family education;Balance training    OT Goals(Current goals can be found in the care plan section) Acute Rehab OT Goals Patient Stated Goal: Return home OT Goal Formulation: With patient Time For Goal Achievement: 01/24/21 Potential to Achieve Goals: Good  OT Frequency: Min 2X/week   Barriers to D/C:            Co-evaluation              AM-PAC OT "6 Clicks" Daily Activity     Outcome Measure Help from another person eating meals?: None Help from another person taking care  of personal grooming?: None Help from another person toileting, which includes using toliet, bedpan, or urinal?: A Lot Help from another person bathing (including washing, rinsing, drying)?: A Lot Help from another person to put on and taking off regular upper body clothing?: A Little Help from another person to put on and taking off regular lower body clothing?: A Lot 6 Click Score: 17   End of Session Equipment Utilized During Treatment: Gait belt;Rolling walker Nurse Communication: Mobility status  Activity Tolerance: Patient limited by fatigue Patient left: in chair;with chair alarm set;with call bell/phone within reach  OT Visit Diagnosis: Unsteadiness on feet (R26.81);Other abnormalities of gait and mobility (R26.89);Muscle weakness (generalized) (M62.81);Pain Pain - part of body:  (back)                Time: 5027-7412 OT Time Calculation (min): 45 min Charges:  OT General Charges $OT Visit: 1 Visit OT Evaluation $OT Eval Low Complexity: 1 Low OT Treatments $Self Care/Home Management : 23-37 mins  Alphia Moh OTR/L  Acute Rehab Services  [phone] office number [phone] pager number   Alphia Moh 01/14/2021, 11:03 AM

## 2021-01-14 NOTE — Progress Notes (Signed)
Pharmacy Antibiotic Note  Marco Kennedy is a 70 y.o. male admitted on 01/10/2021 with sepsis, suspected lumbar infection. Pharmacy has been consulted for Vancomycin and Zosyn dosing. CT lumbar spine obtained in the [facility] ED revealed moderate size abscess located near the pelvic hardware.  Pt is on zosyn D5 for his vertebral osteo. Wound culture has grown out pan sensitive e.coli. ID has narrowed to just zosyn for now with plan to optimize further. Will likely need 6 wks of abx  CrCl~76 ml/min  Plan: Zosyn 3.375gm IV q8h   Height: 5\' 7"  (170.2 cm) Weight: 122.2 kg (269 lb 6.4 oz) IBW/kg (Calculated) : 66.1  Temp (24hrs), Avg:98.4 F (36.9 C), Min:97.4 F (36.3 C), Max:99.7 F (37.6 C)  Recent Labs  Lab 01/10/21 2218 01/11/21 0500 01/12/21 0436 01/13/21 0048 01/14/21 0256  WBC 15.1* 15.9* 17.8* 20.3* 18.6*  CREATININE 0.99 0.94 0.98 0.91 1.12  LATICACIDVEN 1.4  --   --   --   --      Estimated Creatinine Clearance: 76.8 mL/min (by C-G formula based on SCr of 1.12 mg/dL).    No Known Allergies  Antimicrobials this admission: 7/16 Zosyn >>  7/16 Vanc >> 7/19   Microbiology results: 7/17 wound cx: pan sens e.coli 7/16 BCx: ngtd 7/17 UA: neg 7/19 thora wound>>ngtd  8/19, PharmD, [place], AAHIVP, CPP Infectious Disease Pharmacist 01/14/2021 7:33 AM

## 2021-01-14 NOTE — Progress Notes (Signed)
Subjective: Patient reports that he is doing well and is pleased with his postoperative status. He has mild incisional discomfort. Pain has significantly improved from his preoperative level. No acute events overnight.  Objective: Vital signs in last 24 hours: Temp:  [97.4 F (36.3 C)-99.2 F (37.3 C)] 97.6 F (36.4 C) (07/20 0442) Pulse Rate:  [50-89] 50 (07/20 0442) Resp:  [11-29] 18 (07/20 0442) BP: (102-147)/(56-77) 123/63 (07/20 0442) SpO2:  [89 %-97 %] 95 % (07/20 0442)  Intake/Output from previous day: 07/19 0701 - 07/20 0700 In: 2315.7 [P.O.:720; I.V.:1200; IV Piggyback:395.7] Out: 605 [Urine:500; Drains:55; Blood:50] Intake/Output this shift: No intake/output data recorded.  Physical Exam: Patient is awake, A/O X 4, conversant, and in good spirits. He is in NAD and VSS. Speech is fluent and appropriate. Doing well. MAEW with good strength. Sensation to light touch is intact. PERLA, EOMI. CNs grossly intact. Dressing is clean dry intact. Incision is well approximated with no drainage, erythema, or edema. Drain with approximately 55 ml of sanguinous output overnight.   Lab Results: Recent Labs    01/13/21 0048 01/14/21 0256  WBC 20.3* 18.6*  HGB 11.2* 10.8*  HCT 34.3* 33.2*  PLT 253 250   BMET Recent Labs    01/13/21 0048 01/14/21 0256  NA 135 136  K 3.5 3.6  CL 100 100  CO2 25 27  GLUCOSE 121* 146*  BUN 21 26*  CREATININE 0.91 1.12  CALCIUM 7.7* 7.7*    Studies/Results: No results found.  Assessment/Plan: Patient is post-op day 1 s/p thoracolumbar wound debridement. Blood cultures remain negative x4 and his abscess aspirate culture grew E. Coli. OR fluid cultures from 7/19 are pending. ID consulted for wound infection and antibiotic management. I appreciate their assistance in managing this patient. Per ID, Patient will likely need 6 weeks of antibiotic therapy. He is recovering well and reports a significant reduction in his preoperative symptoms.  His  only complaint is mild incisional discomfort.  He is awaiting PT/OT evaluation. Continue working on pain control, mobility and ambulating patient.    LOS: 3 days     Council Mechanic, DNP, NP-C 01/14/2021, 9:44 AM

## 2021-01-15 ENCOUNTER — Inpatient Hospital Stay: Payer: Self-pay

## 2021-01-15 DIAGNOSIS — T847XXD Infection and inflammatory reaction due to other internal orthopedic prosthetic devices, implants and grafts, subsequent encounter: Secondary | ICD-10-CM | POA: Diagnosis not present

## 2021-01-15 DIAGNOSIS — M4620 Osteomyelitis of vertebra, site unspecified: Secondary | ICD-10-CM | POA: Diagnosis not present

## 2021-01-15 DIAGNOSIS — L02212 Cutaneous abscess of back [any part, except buttock]: Secondary | ICD-10-CM | POA: Diagnosis not present

## 2021-01-15 LAB — CBC
HCT: 33.3 % — ABNORMAL LOW (ref 39.0–52.0)
Hemoglobin: 10.8 g/dL — ABNORMAL LOW (ref 13.0–17.0)
MCH: 28.3 pg (ref 26.0–34.0)
MCHC: 32.4 g/dL (ref 30.0–36.0)
MCV: 87.4 fL (ref 80.0–100.0)
Platelets: 274 10*3/uL (ref 150–400)
RBC: 3.81 MIL/uL — ABNORMAL LOW (ref 4.22–5.81)
RDW: 15.3 % (ref 11.5–15.5)
WBC: 20.6 10*3/uL — ABNORMAL HIGH (ref 4.0–10.5)
nRBC: 0 % (ref 0.0–0.2)

## 2021-01-15 LAB — COMPREHENSIVE METABOLIC PANEL
ALT: 70 U/L — ABNORMAL HIGH (ref 0–44)
AST: 66 U/L — ABNORMAL HIGH (ref 15–41)
Albumin: 1.9 g/dL — ABNORMAL LOW (ref 3.5–5.0)
Alkaline Phosphatase: 81 U/L (ref 38–126)
Anion gap: 7 (ref 5–15)
BUN: 32 mg/dL — ABNORMAL HIGH (ref 8–23)
CO2: 26 mmol/L (ref 22–32)
Calcium: 7.6 mg/dL — ABNORMAL LOW (ref 8.9–10.3)
Chloride: 102 mmol/L (ref 98–111)
Creatinine, Ser: 1.15 mg/dL (ref 0.61–1.24)
GFR, Estimated: >60 mL/min (ref >60)
Glucose, Bld: 154 mg/dL — ABNORMAL HIGH (ref 70–99)
Potassium: 3.6 mmol/L (ref 3.5–5.1)
Sodium: 135 mmol/L (ref 135–145)
Total Bilirubin: 1.3 mg/dL — ABNORMAL HIGH (ref 0.3–1.2)
Total Protein: 4.8 g/dL — ABNORMAL LOW (ref 6.5–8.1)

## 2021-01-15 LAB — CULTURE, BLOOD (ROUTINE X 2)
Culture: NO GROWTH
Special Requests: ADEQUATE

## 2021-01-15 MED ORDER — CEFAZOLIN SODIUM-DEXTROSE 2-4 GM/100ML-% IV SOLN
2.0000 g | Freq: Three times a day (TID) | INTRAVENOUS | Status: DC
  Administered 2021-01-15 – 2021-01-16 (×4): 2 g via INTRAVENOUS
  Filled 2021-01-15 (×6): qty 100

## 2021-01-15 MED ORDER — CEFAZOLIN IV (FOR PTA / DISCHARGE USE ONLY): 2.0000 g | Freq: Three times a day (TID) | INTRAVENOUS | 0 refills | Status: DC

## 2021-01-15 MED ORDER — SODIUM CHLORIDE 0.9% FLUSH
10.0000 mL | Freq: Two times a day (BID) | INTRAVENOUS | Status: DC
  Administered 2021-01-15: 20 mL
  Administered 2021-01-16: 10 mL

## 2021-01-15 MED ORDER — SODIUM CHLORIDE 0.9% FLUSH
10.0000 mL | INTRAVENOUS | Status: DC | PRN
  Administered 2021-01-16: 10 mL

## 2021-01-15 MED ORDER — CHLORHEXIDINE GLUCONATE CLOTH 2 % EX PADS
6.0000 | MEDICATED_PAD | Freq: Every day | CUTANEOUS | Status: DC
  Administered 2021-01-15 – 2021-01-16 (×2): 6 via TOPICAL

## 2021-01-15 MED ORDER — POLYETHYLENE GLYCOL 3350 17 G PO PACK: 17.0000 g | PACK | Freq: Two times a day (BID) | ORAL | 0 refills | Status: DC

## 2021-01-15 MED ORDER — OXYCODONE HCL 5 MG PO TABS: 5.0000 mg | ORAL_TABLET | Freq: Four times a day (QID) | ORAL | 0 refills | Status: DC | PRN

## 2021-01-15 NOTE — Plan of Care (Signed)

## 2021-01-15 NOTE — TOC Progression Note (Addendum)
Transition of Care [facility]) - Progression Note    Patient Details  Name: Marco Kennedy MRN: [id number] Date of Birth: 11/14/1950  Transition of Care [facility]) CM/SW Contact  Leone Haven, RN Phone Number: 01/15/2021, 1:27 PM  Clinical Narrative:    NCM spoke with wife , she has had the iv abx teaching with Jeri Modena.  She wants this NCM to try to get [facility] thru [facility] in [place], [place] made referral and they unable to take referral for [facility] for IV abx because his insurance [employer] will not let them do it.  NCM asked if it would be ok for [facility] to provide the picc line care for him, he states yes.          Expected Discharge Plan and Services                                                 Social Determinants of Health (SDOH) Interventions    Readmission Risk Interventions No flowsheet data found.

## 2021-01-15 NOTE — Progress Notes (Signed)
Peripherally Inserted Central Catheter Placement  The IV Nurse has discussed with the patient and/or persons authorized to consent for the patient, the purpose of this procedure and the potential benefits and risks involved with this procedure.  The benefits include less needle sticks, lab draws from the catheter, and the patient may be discharged home with the catheter. Risks include, but not limited to, infection, bleeding, blood clot (thrombus formation), and puncture of an artery; nerve damage and irregular heartbeat and possibility to perform a PICC exchange if needed/ordered by physician.  Alternatives to this procedure were also discussed.  Bard Power PICC patient education guide, fact sheet on infection prevention and patient information card has been provided to patient /or left at bedside.    PICC Placement Documentation  PICC Single Lumen 01/15/21 Right Basilic 39 cm 0 cm (Active)  Indication for Insertion or Continuance of Line Home intravenous therapies (PICC only) 01/15/21 1510  Exposed Catheter (cm) 0 cm 01/15/21 1510  Site Assessment Clean;Intact;Dry 01/15/21 1510  Line Status Flushed;Blood return noted 01/15/21 1510  Dressing Type Transparent 01/15/21 1510  Dressing Status Clean;Dry;Intact 01/15/21 1510  Antimicrobial disc in place? Yes 01/15/21 1510  Dressing Intervention New dressing;Other (Comment) 01/15/21 1510  Dressing Change Due 01/22/21 01/15/21 1510       Reginia Forts Albarece 01/15/2021, 3:11 PM

## 2021-01-15 NOTE — Progress Notes (Signed)
Hemovac drain removed per order. Pt tolerated well. Call light in reach.  Versie Starks, RN

## 2021-01-15 NOTE — Progress Notes (Signed)
Physical Therapy Treatment Patient Details Name: Marco Kennedy MRN: [id number] DOB: 1950/09/25 Today's Date: 01/15/2021    History of Present Illness 70 y/o male admitted 7/16 with a pelvic abscess, presenting with worsening L flank pain ongoing for a week. 7/17 aspiration. 7/19 thoracolumbar wound debridement. 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 Emphasis on LLE therapeutic exercises for ankle strengthening due to decreased dorsiflexion, techniques to improve L ankle DF ROM (HEP given), safety with transfers/assistive device use and gradual progression of mobility within tolerance. Pt continues to benefit from PT services to progress toward functional mobility goals.   Follow Up Recommendations  Home health PT     Equipment Recommendations  None recommended by PT    Recommendations for Other Services       Precautions / Restrictions Precautions Precautions: Fall Restrictions Weight Bearing Restrictions: No    Mobility  Bed Mobility               General bed mobility comments: pt received up in chair and wanting to remain in chair to eat lunch    Transfers Overall transfer level: Needs assistance Equipment used: Rolling walker (2 wheeled) Transfers: Sit to/from Stand Sit to Stand: Min guard;From elevated surface;+2 safety/equipment         General transfer comment: from recliner, cues for safety/sequencing; spouse standing on opposite side of therapist for encouragement  Ambulation/Gait Ambulation/Gait assistance: Min guard Gait Distance (Feet): 200 Feet Assistive device: Rolling walker (2 wheeled) Gait Pattern/deviations: Step-through pattern;Trunk flexed     General Gait Details: Demonstrated trunk flexion, forward head, rounded shoulders  during ambulation. Tight grip on RW and poor waveform so difficult to assess SpO2 at times, when pt cued to take standing break and relax hand, SpO2 signal 91-94% on RA, no DOE. Mod cues needed for forward gaze and more upright posture. Noted decreased dorsiflexion/foot clearance on LLE causing need for socked to be pulled up during ambulation.   Stairs             Wheelchair Mobility    Modified Rankin (Stroke Patients Only)       Balance Overall balance assessment: Needs assistance Sitting-balance support: Feet supported Sitting balance-Leahy Scale: Good     Standing balance support: Bilateral upper extremity supported Standing balance-Leahy Scale: Poor Standing balance comment: reliant on RW and external support for safety                            Cognition Arousal/Alertness: Awake/alert Behavior During Therapy: WFL for tasks assessed/performed Overall Cognitive Status: Within Functional Limits for tasks assessed                                        Exercises Other Exercises Other Exercises: seated BLE AROM: hip flexion (not >90 deg), LAQ, ankle pumps, ankle eversion/inversion Other Exercises: LLE PROM heel cord stretch using strap 3 x 30 sec Other Exercises: HEP handout: [email or web address] Access Code: [email or web address]    General Comments General comments (skin integrity, edema, etc.): BP 135/61 pre-mobility and BP 125/67 (83) post-exertion seated; HR 67-82 bpm and SpO2 91-94% on RA with exertion      Pertinent  Vitals/Pain Pain Assessment: Faces Faces Pain Scale: Hurts a little bit Pain Location: incisional back Pain Descriptors / Indicators: Grimacing Pain Intervention(s): Monitored during session;Repositioned    Home Living                      Prior Function            PT Goals (current goals can now be found in the care plan section) Acute Rehab PT Goals Patient Stated Goal: Return home PT Goal Formulation:  With patient Time For Goal Achievement: 01/27/21 Progress towards PT goals: Progressing toward goals    Frequency    Min 3X/week      PT Plan Current plan remains appropriate    Co-evaluation              AM-PAC PT "6 Clicks" Mobility   Outcome Measure  Help needed turning from your back to your side while in a flat bed without using bedrails?: A Lot Help needed moving from lying on your back to sitting on the side of a flat bed without using bedrails?: A Lot Help needed moving to and from a bed to a chair (including a wheelchair)?: A Little Help needed standing up from a chair using your arms (e.g., wheelchair or bedside chair)?: A Little Help needed to walk in hospital room?: A Little Help needed climbing 3-5 steps with a railing? : A Lot 6 Click Score: 15    End of Session Equipment Utilized During Treatment: Gait belt Activity Tolerance: Patient tolerated treatment well Patient left: in chair;with call bell/phone within reach;with chair alarm set;with family/visitor present Nurse Communication: Mobility status PT Visit Diagnosis: Muscle weakness (generalized) (M62.81);Other abnormalities of gait and mobility (R26.89)     Time: 4098-1191 PT Time Calculation (min) (ACUTE ONLY): 33 min  Charges:  $Gait Training: 8-22 mins $Therapeutic Exercise: 8-22 mins                     Marco Mcmurtry P., PTA Acute Rehabilitation Services Pager: [phone] Office: [phone]    Angus Palms 01/15/2021, 2:48 PM

## 2021-01-15 NOTE — Progress Notes (Signed)
Occupational Therapy Treatment Patient Details Name: Marco Kennedy MRN: [id number] DOB: May 20, 1951 Today's Date: 01/15/2021    History of present illness 70 y/o male admitted 7/16 with a pelvic abscess, presenting with worsening L flank pain ongoing for a week. 7/17 aspiration. 7/19 thoracolumbar wound debridement. 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 Pt with sit to stand surfaces requires increase in height and cues to pace self as o2 on room air went to 87% but with deep diaphragm breathing was able to bring back up to 94%. Pt then completed toilet tasks with BSC over toilet with min guard for ambulation and FW, moderate to total assist for hygiene and min guard for transfers from commode. Pt currently with functional limitations due to the deficits listed below (see OT Problem List).  Pt will benefit from skilled OT to increase their safety and independence with ADL and functional mobility for ADL to facilitate discharge to venue listed below.    Follow Up Recommendations  Home health OT;Supervision/Assistance - 24 hour    Equipment Recommendations  3 in 1 bedside commode    Recommendations for Other Services      Precautions / Restrictions Precautions Precautions: Fall Restrictions Weight Bearing Restrictions: No       Mobility Bed Mobility Overal bed mobility: Needs Assistance Bed Mobility: Supine to Sit     Supine to sit: Min guard;HOB elevated (from R side)          Transfers Overall transfer level: Needs assistance Equipment used: Rolling walker (2 wheeled) Transfers: Sit to/from Stand Sit to Stand: Min guard;From elevated surface              Balance Overall balance assessment: Needs assistance Sitting-balance support: Feet supported Sitting balance-Leahy Scale: Good                                      ADL either performed or assessed with clinical judgement   ADL Overall ADL's : Needs assistance/impaired Eating/Feeding: Independent;Sitting   Grooming: Wash/dry hands;Wash/dry face;Set up;Sitting   Upper Body Bathing: Minimal assistance;Sitting;Cueing for safety;Cueing for sequencing   Lower Body Bathing: Moderate assistance;Cueing for safety;Cueing for sequencing;Sit to/from stand   Upper Body Dressing : Minimal assistance;Cueing for safety;Cueing for sequencing;Sitting   Lower Body Dressing: Cueing for safety;Cueing for sequencing;Sit to/from stand;Moderate assistance   Toilet Transfer: Supervision/safety;Cueing for safety;Cueing for sequencing;BSC;Regular Toilet;Grab bars   Toileting- Clothing Manipulation and Hygiene: Cueing for safety;Cueing for sequencing;Sit to/from stand;Total assistance       Functional mobility during ADLs: Minimal assistance;Rolling walker;Cueing for safety;Cueing for sequencing       Vision       Perception     Praxis      Cognition Arousal/Alertness: Awake/alert Behavior During Therapy: WFL for tasks assessed/performed Overall Cognitive Status: Within Functional Limits for tasks assessed                                          Exercises     Shoulder Instructions       General Comments      Pertinent Vitals/ Pain  Pain Assessment: 0-10 Pain Score: 2  Pain Descriptors / Indicators: Grimacing Pain Intervention(s): Monitored during session  Home Living                                          Prior Functioning/Environment              Frequency  Min 2X/week        Progress Toward Goals  OT Goals(current goals can now be found in the care plan section)  Progress towards OT goals: Progressing toward goals  Acute Rehab OT Goals Patient Stated Goal: Return home OT Goal Formulation: With patient Time For Goal Achievement:  01/24/21 Potential to Achieve Goals: Good  Plan Discharge plan remains appropriate    Co-evaluation                 AM-PAC OT "6 Clicks" Daily Activity     Outcome Measure   Help from another person eating meals?: None Help from another person taking care of personal grooming?: None Help from another person toileting, which includes using toliet, bedpan, or urinal?: A Lot Help from another person bathing (including washing, rinsing, drying)?: A Lot Help from another person to put on and taking off regular upper body clothing?: A Little Help from another person to put on and taking off regular lower body clothing?: A Lot 6 Click Score: 17    End of Session Equipment Utilized During Treatment: Gait belt;Rolling walker  OT Visit Diagnosis: Unsteadiness on feet (R26.81);Other abnormalities of gait and mobility (R26.89);Muscle weakness (generalized) (M62.81);Pain Pain - part of body:  (back)   Activity Tolerance Patient limited by fatigue   Patient Left in chair;with call bell/phone within reach;with family/visitor present   Nurse Communication Mobility status;Other (comment) (loose BMs)        Time: 5885-0277 OT Time Calculation (min): 46 min  Charges: OT General Charges $OT Visit: 1 Visit OT Treatments $Self Care/Home Management : 38-52 mins  Alphia Moh OTR/L  Acute Rehab Services  [phone] office number [phone] pager number    Alphia Moh 01/15/2021, 8:34 AM

## 2021-01-15 NOTE — Progress Notes (Signed)
PROGRESS NOTE    Marco Kennedy  OZD:[id number] DOB: July 15, 1950 DOA: 01/10/2021 PCP: Maximiano Coss, MD   Brief Narrative:  Marco Kennedy is a 70 y.o. male with medical history significant for back surgery with pelvis fixation in November 2021, OSA on BiPAP, who presented to [facility] ED due to sudden onset progressively worsening left flank pain of 1 week duration.  Associated with nausea, vomiting, generalized weakness, subjective fevers and chills and sweats.  CT lumbar spine obtained in the [facility] ED revealed moderate size abscess located near spinal hardware.  Since his back surgery was done by Dr. Venetia Maxon, decision was made to transfer to [facility] for neurosurgical assessment.  Seen by neurosurgery who recommended hospitalist admission, they will see in consultation.  Assessment & Plan:   Active Problems:   Abscess of back   Vertebral osteomyelitis (HCC)   Hardware complicating wound infection (HCC)  Sepsis secondary to moderate size abscess located near the pelvic hardware, POA. Status post back surgery/hardware insertion - NeuroSx, ID, and interventional IR following, appreciate insight and recommendations - Status post wound debridement with neurosurgery 01/13/2021 -tolerated well -drain containing serosanguineous fluid - Pain well controlled - Fluid collection aspirated 01/11/2021, follow cultures -E. coli on multiple cultures -Antibiotics de-escalated to cefazolin per discussion with ID, plan for 6 weeks of treatment, PICC line pending for outpatient treatment for 6 weeks starting 01/12/2021   Intractable nausea and vomiting with elevated liver chemistries, resolved - Likely secondary to above sepsis  - RUQ - 10 mm gallbladder polyp seen - RUQ benign on exam; labs downtrending with treatment of sepsis   Constipation, POA, improving -Patient reports 7 days of constipation, now worsened in the setting of narcotics -BM yesterday and again overnight -Continue  MiraLAX and Senokot twice daily, patient did not require MiraLAX or increased GoLytely dosing  Pain induced hypertension  Improved with pain control   Hypokalemia Mild, repleted    Hyperglycemia Transient, A1c 5.9 Discontinue sliding scale insulin/hypoglycemic protocol  OSA on BiPAP Resume BiPAP nightly per home settings  DVT prophylaxis: Subcu Lovenox daily Code Status: Full code Family Communication: Wife present at bedside  Status is: inpt  Dispo: The patient is from: Home              Anticipated d/c is to: Home              Anticipated d/c date is: 24 hours              Patient currently not medically stable for discharge  Consultants:  Neurosurgery, interventional radiology  Procedures:  Abscess aspiration 01/12/21 Thoracolumbar wound debridement 01/13/21  Antimicrobials:  Vancomycin 7/16-7/19 Zosyn 01/10/2021, ongoing  Subjective: No acute issues or events overnight, GI symptoms improving, notable bowel movement yesterday and again overnight, denies nausea vomiting headache fevers chills chest pain shortness of breath.  Back pain currently well controlled on current regimen.  Objective: Vitals:   01/14/21 1549 01/14/21 2144 01/14/21 2333 01/15/21 0414  BP: 115/60 (!) 144/71 (!) 148/70 (!) 130/51  Pulse: 62 69 70 66  Resp: 14 20 20 19   Temp: 97.7 F (36.5 C) 98.4 F (36.9 C) 98.7 F (37.1 C) 97.8 F (36.6 C)  TempSrc: Oral Oral Oral Oral  SpO2: 96% 95% 96% 95%  Weight:      Height:        Intake/Output Summary (Last 24 hours) at 01/15/2021 0755 Last data filed at 01/15/2021 0619 Gross per 24 hour  Intake 750 ml  Output 40 ml  Net 710 ml    Filed Weights   01/11/21 0452 01/11/21 0551 01/13/21 0837  Weight: 69.1 kg 122.2 kg 122.2 kg    Examination:  General:  Pleasantly resting in bed, No acute distress. HEENT:  Normocephalic atraumatic.  Sclerae nonicteric, noninjected.  Extraocular movements intact bilaterally. Neck:  Without mass or  deformity.  Trachea is midline. Lungs:  Clear to auscultate bilaterally without rhonchi, wheeze, or rales. Heart:  Regular rate and rhythm.  Without murmurs, rubs, or gallops. Abdomen:  Soft, nontender, nondistended.  Without guarding or rebound. Extremities: Without cyanosis, clubbing, edema, or obvious deformity. Vascular:  Dorsalis pedis and posterior tibial pulses palpable bilaterally. Skin:  Warm and dry, no erythema, no ulcerations - wound drain from thoracic surgical site, bandage clean dry intact  Data Reviewed: I have personally reviewed following labs and imaging studies  CBC: Recent Labs  Lab 01/10/21 2218 01/11/21 0500 01/12/21 0436 01/13/21 0048 01/14/21 0256 01/15/21 0600  WBC 15.1* 15.9* 17.8* 20.3* 18.6* 20.6*  NEUTROABS 12.7*  --   --   --   --   --   HGB 13.1 12.1* 11.2* 11.2* 10.8* 10.8*  HCT 41.2 36.8* 34.9* 34.3* 33.2* 33.3*  MCV 87.1 85.6 87.5 84.9 86.2 87.4  PLT 173 177 213 253 250 274    Basic Metabolic Panel: Recent Labs  Lab 01/11/21 0500 01/11/21 0507 01/12/21 0436 01/13/21 0048 01/14/21 0256 01/15/21 0600  NA 135  --  134* 135 136 135  K 3.3*  --  3.9 3.5 3.6 3.6  CL 103  --  101 100 100 102  CO2 24  --  24 25 27 26   GLUCOSE 163*  --  109* 121* 146* 154*  BUN 20  --  20 21 26* 32*  CREATININE 0.94  --  0.98 0.91 1.12 1.15  CALCIUM 7.7*  --  7.7* 7.7* 7.7* 7.6*  MG  --  1.9  --   --   --   --   PHOS  --  2.7  --   --   --   --     GFR: Estimated Creatinine Clearance: 74.8 mL/min (by C-G formula based on SCr of 1.15 mg/dL). Liver Function Tests: Recent Labs  Lab 01/11/21 0500 01/12/21 0436 01/13/21 0048 01/14/21 0256 01/15/21 0600  AST 122* 90* 131* 137* 66*  ALT 103* 78* 101* 98* 70*  ALKPHOS 103 98 94 87 81  BILITOT 1.9* 1.7* 1.7* 1.4* 1.3*  PROT 5.5* 5.1* 5.3* 4.9* 4.8*  ALBUMIN 2.3* 2.0* 2.0* 1.9* 1.9*    Recent Labs  Lab 01/10/21 2218  LIPASE 28    No results for input(s): AMMONIA in the last 168  hours. Coagulation Profile: No results for input(s): INR, PROTIME in the last 168 hours. Cardiac Enzymes: No results for input(s): CKTOTAL, CKMB, CKMBINDEX, TROPONINI in the last 168 hours. BNP (last 3 results) No results for input(s): PROBNP in the last 8760 hours. HbA1C: No results for input(s): HGBA1C in the last 72 hours.  CBG: Recent Labs  Lab 01/13/21 0015 01/13/21 0342 01/13/21 0749 01/13/21 1052 01/13/21 1158  GLUCAP 122* 124* 121* 126* 138*    Lipid Profile: No results for input(s): CHOL, HDL, LDLCALC, TRIG, CHOLHDL, LDLDIRECT in the last 72 hours. Thyroid Function Tests: No results for input(s): TSH, T4TOTAL, FREET4, T3FREE, THYROIDAB in the last 72 hours. Anemia Panel: No results for input(s): VITAMINB12, FOLATE, FERRITIN, TIBC, IRON, RETICCTPCT in the last 72 hours. Sepsis Labs: Recent Labs  Lab 01/10/21 2218 01/11/21 0500 01/12/21 0436  PROCALCITON  --  1.61 2.09  LATICACIDVEN 1.4  --   --      Recent Results (from the past 240 hour(s))  Blood culture (routine x 2)     Status: None (Preliminary result)   Collection Time: 01/10/21 10:18 PM   Specimen: BLOOD LEFT FOREARM  Result Value Ref Range Status   Specimen Description BLOOD LEFT FOREARM  Final   Special Requests   Final    BOTTLES DRAWN AEROBIC AND ANAEROBIC Blood Culture adequate volume   Culture   Final    NO GROWTH 4 DAYS Performed at [facility], [address] [place], [place] [address]    Report Status PENDING  Incomplete  Blood culture (routine x 2)     Status: None (Preliminary result)   Collection Time: 01/10/21 10:18 PM   Specimen: BLOOD  Result Value Ref Range Status   Specimen Description BLOOD RIGHT ANTECUBITAL  Final   Special Requests   Final    BOTTLES DRAWN AEROBIC AND ANAEROBIC Blood Culture adequate volume   Culture   Final    NO GROWTH 3 DAYS Performed at [facility], [address] [place], [place] [address]    Report Status PENDING  Incomplete   Aerobic/Anaerobic Culture w Gram Stain (surgical/deep wound)     Status: None (Preliminary result)   Collection Time: 01/11/21  3:02 PM   Specimen: Wound  Result Value Ref Range Status   Specimen Description WOUND  Final   Special Requests ABSCESS OF BACK  Final   Gram Stain   Final    FEW WBC PRESENT, PREDOMINANTLY PMN NO ORGANISMS SEEN Performed at [facility], [address] [place], [place] [address]    Culture   Final    RARE ESCHERICHIA COLI NO ANAEROBES ISOLATED; CULTURE IN PROGRESS FOR 5 DAYS    Report Status PENDING  Incomplete   Organism ID, Bacteria ESCHERICHIA COLI  Final      Susceptibility   Escherichia coli - MIC*    AMPICILLIN <=2 SENSITIVE Sensitive     CEFAZOLIN <=4 SENSITIVE Sensitive     CEFEPIME <=0.12 SENSITIVE Sensitive     CEFTAZIDIME <=1 SENSITIVE Sensitive     CEFTRIAXONE <=0.25 SENSITIVE Sensitive     CIPROFLOXACIN <=0.25 SENSITIVE Sensitive     GENTAMICIN <=1 SENSITIVE Sensitive     IMIPENEM <=0.25 SENSITIVE Sensitive     TRIMETH/SULFA <=20 SENSITIVE Sensitive     AMPICILLIN/SULBACTAM <=2 SENSITIVE Sensitive     PIP/TAZO <=4 SENSITIVE Sensitive     * RARE ESCHERICHIA COLI  SARS Coronavirus 2 by RT PCR (hospital order, performed in [facility] hospital lab) Nasopharyngeal Nasopharyngeal Swab     Status: None   Collection Time: 01/13/21  7:53 AM   Specimen: Nasopharyngeal Swab  Result Value Ref Range Status   SARS Coronavirus 2 NEGATIVE NEGATIVE Final    Comment: (NOTE) SARS-CoV-2 target nucleic acids are NOT DETECTED.  The SARS-CoV-2 RNA is generally detectable in upper and lower respiratory specimens during the acute phase of infection. The lowest concentration of SARS-CoV-2 viral copies this assay can detect is 250 copies / mL. A negative result does not preclude SARS-CoV-2 infection and should not be used as the sole basis for treatment or other patient management decisions.  A negative result may occur with improper specimen  collection / handling, submission of specimen other than nasopharyngeal swab, presence of viral mutation(s) within the areas targeted  by this assay, and inadequate number of viral copies (<250 copies / mL). A negative result must be combined with clinical observations, patient history, and epidemiological information.  Fact Sheet for Patients:   BoilerBrush.com.cy  Fact Sheet for Healthcare Providers: https://pope.com/  This test is not yet approved or  cleared by the [place] FDA and has been authorized for detection and/or diagnosis of SARS-CoV-2 by FDA under an Emergency Use Authorization (EUA).  This EUA will remain in effect (meaning this test can be used) for the duration of the COVID-19 declaration under Section 564(b)(1) of the Act, 21 U.S.C. section 360bbb-3(b)(1), unless the authorization is terminated or revoked sooner.  Performed at [facility], [address] [place], [place] [address]   Aerobic/Anaerobic Culture w Gram Stain (surgical/deep wound)     Status: None (Preliminary result)   Collection Time: 01/13/21  9:48 AM   Specimen: PATH Other; Tissue  Result Value Ref Range Status   Specimen Description WOUND  Final   Special Requests LEFT THORACOLUMBAR SPEC A  Final   Gram Stain   Final    NO WBC SEEN NO ORGANISMS SEEN Performed at [facility], [address], [place], [place] [address]    Culture RARE GRAM NEGATIVE RODS  Final   Report Status PENDING  Incomplete  Aerobic/Anaerobic Culture w Gram Stain (surgical/deep wound)     Status: None (Preliminary result)   Collection Time: 01/13/21  9:55 AM   Specimen: PATH Other; Tissue  Result Value Ref Range Status   Specimen Description TISSUE  Final   Special Requests LEFT THORACOLUMBAR  Final   Gram Stain   Final    RARE WBC PRESENT, PREDOMINANTLY MONONUCLEAR NO ORGANISMS SEEN Performed at [facility], [address], [place], [place]  [address]    Culture RARE GRAM NEGATIVE RODS  Final   Report Status PENDING  Incomplete  Aerobic/Anaerobic Culture w Gram Stain (surgical/deep wound)     Status: None (Preliminary result)   Collection Time: 01/13/21  9:57 AM   Specimen: PATH Other; Tissue  Result Value Ref Range Status   Specimen Description WOUND  Final   Special Requests LEFT THORACOLUMBAR SPEC C  Final   Gram Stain NO WBC SEEN NO ORGANISMS SEEN   Final   Culture   Final    RARE GRAM NEGATIVE RODS SUSCEPTIBILITIES TO FOLLOW CULTURE REINCUBATED FOR BETTER GROWTH Performed at [facility], [address], [place], [place] [address]    Report Status PENDING  Incomplete  Aerobic/Anaerobic Culture w Gram Stain (surgical/deep wound)     Status: None (Preliminary result)   Collection Time: 01/13/21  9:58 AM   Specimen: PATH Other; Tissue  Result Value Ref Range Status   Specimen Description WOUND  Final   Special Requests LEFT THORACOLUMBAR SPEC B  Final   Gram Stain   Final    NO WBC SEEN NO ORGANISMS SEEN Performed at [facility], [address], [place], [place] [address]    Culture RARE GRAM NEGATIVE RODS  Final   Report Status PENDING  Incomplete    Radiology Studies: No results found.   Scheduled Meds:  cholecalciferol  1,000 Units Oral Daily   diclofenac  75 mg Oral BID   multivitamin with minerals  1 tablet Oral Daily   polyethylene glycol  17 g Oral BID   polyethylene glycol-electrolytes  4,000 mL Oral Once   senna-docusate  1 tablet Oral BID   Continuous  Infusions:  piperacillin-tazobactam (ZOSYN)  IV 3.375 g (01/15/21 1191)     LOS: 4 days   Time spent:  Azucena Fallen, DO [facility]  If 7PM-7AM, please contact night-coverage [email or web address]  01/15/2021, 7:55 AM

## 2021-01-15 NOTE — Progress Notes (Signed)
PHARMACY CONSULT NOTE FOR:  OUTPATIENT  PARENTERAL ANTIBIOTIC THERAPY (OPAT)  Indication: vertebral osteomyelitis Regimen: cefazolin 2g IV q8h End date: 02/24/2021  IV antibiotic discharge orders are pended. To discharging provider:  please sign these orders via discharge navigator,  Select New Orders & click on the button choice - Manage This Unsigned Work.     Thank you for allowing pharmacy to be a part of this patient's care.  Jeannetta Nap 01/15/2021, 10:55 AM

## 2021-01-15 NOTE — Progress Notes (Signed)
Subjective: Patient reports that he is feeling better. He does report appropriate incisional pain. No acute events overnight.   Objective: Vital signs in last 24 hours: Temp:  [97.6 F (36.4 C)-98.7 F (37.1 C)] 98.1 F (36.7 C) (07/21 0842) Pulse Rate:  [62-92] 92 (07/21 0842) Resp:  [13-20] 18 (07/21 0842) BP: (112-148)/(51-72) 136/72 (07/21 0842) SpO2:  [93 %-97 %] 93 % (07/21 0842)  Intake/Output from previous day: 07/20 0701 - 07/21 0700 In: 750 [P.O.:600; IV Piggyback:150] Out: 40 [Drains:40] Intake/Output this shift: Total I/O In: 240 [P.O.:240] Out: 300 [Urine:300]  Physical Exam: Patient is awake, A/O X 4, conversant, and in good spirits. He is in NAD, sitting comfortably in his bedside chair and VSS. Speech is fluent and appropriate. Doing well. MAEW with good strength. Sensation to light touch is intact. PERLA, EOMI. CNs grossly intact. Dressing is clean dry intact. Incision is well approximated with no drainage, erythema, or edema. Drain with approximately 10 ml of sanguinous output overnight.   Lab Results: Recent Labs    01/14/21 0256 01/15/21 0600  WBC 18.6* 20.6*  HGB 10.8* 10.8*  HCT 33.2* 33.3*  PLT 250 274   BMET Recent Labs    01/14/21 0256 01/15/21 0600  NA 136 135  K 3.6 3.6  CL 100 102  CO2 27 26  GLUCOSE 146* 154*  BUN 26* 32*  CREATININE 1.12 1.15  CALCIUM 7.7* 7.6*    Studies/Results: No results found.  Assessment/Plan: Patient is post-op day 2 s/p thoracolumbar wound debridement. Blood cultures remain negative x4 and his abscess aspirate culture grew E. Coli. OR fluid cultures from 7/19 are pending. ID on board for antibiotic management. Per ID, Patient will likely need 6 weeks of antibiotic therapy. He is continuing to recover well.  His only complaint is mild incisional discomfort.  Continue PT/OT. Continue working on pain control, mobility and ambulating patient.   -Remove drain today    LOS: 4 days     Council Mechanic,  DNP, NP-C 01/15/2021, 9:14 AM

## 2021-01-15 NOTE — Progress Notes (Signed)
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 Cx pansensitive ecoli. Bcx negative  Doing well postop   -switch pip-tazo to cefazolin 2 gram iv q8hours -duration 6 weeks from 7/19 until 8/30 -on 8/30 will transition to oral antibiotics, likely cefadroxil for chronic suppression -ok to place PICC today; ordered -OPAT to be arranged -id f/u as below -discussed with primary team/nsg    OPAT Orders Discharge antibiotics to be given via PICC line Discharge antibiotics: cefazolinPIC Care Per Protocol:  Home health RN for IV administration and teaching; PICC line care and labs.    Labs weekly while on IV antibiotics: _x_ CBC with differential __ BMP _x_ CMP _x_ CRP __ ESR __ Vancomycin trough __ CK  _x_ Please pull PIC at completion of IV antibiotics __ Please leave PIC in place until doctor has seen patient or been notified  Fax weekly labs to [phone]  Clinic Follow Up Appt: Dr June Rode on 8/10 @ 245  @  [facility] [place], [place], [place] [address] Phone: [phone]  I spent more than 35 minute reviewing data/chart, and coordinating care and >50% direct face to face time providing counseling/discussing diagnostics/treatment plan with patient    Active Problems:   Abscess of back   Vertebral osteomyelitis ([place])   Hardware complicating wound infection ([place])   No Known Allergies  Scheduled Meds:  cholecalciferol  1,000 Units Oral Daily   diclofenac  75 mg Oral BID   multivitamin with minerals  1 tablet Oral Daily   polyethylene glycol  17 g Oral BID   polyethylene  glycol-electrolytes  4,000 mL Oral Once   senna-docusate  1 tablet Oral BID   Continuous Infusions:  piperacillin-tazobactam (ZOSYN)  IV 3.375 g (01/15/21 0619)   PRN Meds:.acetaminophen, hydrALAZINE, HYDROmorphone (DILAUDID) injection, melatonin, oxyCODONE   SUBJECTIVE: Pain much better Afebrile Wound operative cx growing gnr too Ecoli from aspirate pansensitive Some nausea but no vomiting diarrhea or rash  Review of Systems: ROS All other ROS was negative, except mentioned above     OBJECTIVE: Vitals:   01/14/21 2144 01/14/21 2333 01/15/21 0414 01/15/21 0842  BP: (!) 144/71 (!) 148/70 (!) 130/51 136/72  Pulse: 69 70 66 92  Resp: 20 20 19 18   Temp: 98.4 F (36.9 C) 98.7 F (37.1 C) 97.8 F (36.6 C) 98.1 F (36.7 C)  TempSrc: Oral Oral Oral Oral  SpO2: 95% 96% 95% 93%  Weight:      Height:       Body mass index is 42.19 kg/m.  Physical Exam General/constitutional: no distress, pleasant HEENT: Normocephalic, PER, Conj Clear, EOMI, Oropharynx clear Neck supple CV: rrr no mrg Lungs: clear to auscultation, normal respiratory effort Abd: Soft, Nontender Ext: no edema Skin: No Rash Neuro: nonfocal MSK: no surrounding incision of the back erythema/fluctuance or tenderness. Drain removed   Central line presence: no   Lab Results Lab Results  Component Value Date   WBC 20.6 (H) 01/15/2021   HGB 10.8 (L) 01/15/2021   HCT 33.3 (L) 01/15/2021   MCV 87.4 01/15/2021   PLT 274 01/15/2021    Lab  Results  Component Value Date   CREATININE 1.15 01/15/2021   BUN 32 (H) 01/15/2021   NA 135 01/15/2021   K 3.6 01/15/2021   CL 102 01/15/2021   CO2 26 01/15/2021    Lab Results  Component Value Date   ALT 70 (H) 01/15/2021   AST 66 (H) 01/15/2021   ALKPHOS 81 01/15/2021   BILITOT 1.3 (H) 01/15/2021      Microbiology: Recent Results (from the past 240 hour(s))  Blood culture (routine x 2)     Status: None (Preliminary result)   Collection Time: 01/10/21  10:18 PM   Specimen: BLOOD LEFT FOREARM  Result Value Ref Range Status   Specimen Description BLOOD LEFT FOREARM  Final   Special Requests   Final    BOTTLES DRAWN AEROBIC AND ANAEROBIC Blood Culture adequate volume   Culture   Final    NO GROWTH 4 DAYS Performed at [facility], [place] [address], [place], [place] [address]    Report Status PENDING  Incomplete  Blood culture (routine x 2)     Status: None (Preliminary result)   Collection Time: 01/10/21 10:18 PM   Specimen: BLOOD  Result Value Ref Range Status   Specimen Description BLOOD RIGHT ANTECUBITAL  Final   Special Requests   Final    BOTTLES DRAWN AEROBIC AND ANAEROBIC Blood Culture adequate volume   Culture   Final    NO GROWTH 3 DAYS Performed at [facility], [place] [address] [place], [place] [address]    Report Status PENDING  Incomplete  Aerobic/Anaerobic Culture w Gram Stain (surgical/deep wound)     Status: None (Preliminary result)   Collection Time: 01/11/21  3:02 PM   Specimen: Wound  Result Value Ref Range Status   Specimen Description WOUND  Final   Special Requests ABSCESS OF BACK  Final   Gram Stain   Final    FEW WBC PRESENT, PREDOMINANTLY PMN NO ORGANISMS SEEN Performed at [facility], [address], [place], [place] [address]    Culture   Final    RARE ESCHERICHIA COLI NO ANAEROBES ISOLATED; CULTURE IN PROGRESS FOR 5 DAYS    Report Status PENDING  Incomplete   Organism ID, Bacteria ESCHERICHIA COLI  Final      Susceptibility   Escherichia coli - MIC*    AMPICILLIN <=2 SENSITIVE Sensitive     CEFAZOLIN <=4 SENSITIVE Sensitive     CEFEPIME <=0.12 SENSITIVE Sensitive     CEFTAZIDIME <=1 SENSITIVE Sensitive     CEFTRIAXONE <=0.25 SENSITIVE Sensitive     CIPROFLOXACIN <=0.25 SENSITIVE Sensitive     GENTAMICIN <=1 SENSITIVE Sensitive     IMIPENEM <=0.25 SENSITIVE Sensitive     TRIMETH/SULFA <=20 SENSITIVE Sensitive     AMPICILLIN/SULBACTAM <=2 SENSITIVE Sensitive     PIP/TAZO  <=4 SENSITIVE Sensitive     * RARE ESCHERICHIA COLI  SARS Coronavirus 2 by RT PCR (hospital order, performed in [facility] hospital lab) Nasopharyngeal Nasopharyngeal Swab     Status: None   Collection Time: 01/13/21  7:53 AM   Specimen: Nasopharyngeal Swab  Result Value Ref Range Status   SARS Coronavirus 2 NEGATIVE NEGATIVE Final    Comment: (NOTE) SARS-CoV-2 target nucleic acids are NOT DETECTED.  The SARS-CoV-2 RNA is generally detectable in upper and lower respiratory specimens during the acute phase of infection. The lowest concentration of SARS-CoV-2 viral copies this assay can detect is 250 copies / mL. A negative result does  not preclude SARS-CoV-2 infection and should not be used as the sole basis for treatment or other patient management decisions.  A negative result may occur with improper specimen collection / handling, submission of specimen other than nasopharyngeal swab, presence of viral mutation(s) within the areas targeted by this assay, and inadequate number of viral copies (<250 copies / mL). A negative result must be combined with clinical observations, patient history, and epidemiological information.  Fact Sheet for Patients:   StrictlyIdeas.no  Fact Sheet for Healthcare Providers: BankingDealers.co.za  This test is not yet approved or  cleared by the [place] FDA and has been authorized for detection and/or diagnosis of SARS-CoV-2 by FDA under an Emergency Use Authorization (EUA).  This EUA will remain in effect (meaning this test can be used) for the duration of the COVID-19 declaration under Section 564(b)(1) of the Act, 21 U.S.C. section 360bbb-3(b)(1), unless the authorization is terminated or revoked sooner.  Performed at [facility], [place] [address] [place], [place] [address]   Aerobic/Anaerobic Culture w Gram Stain (surgical/deep wound)     Status: None (Preliminary result)   Collection  Time: 01/13/21  9:48 AM   Specimen: PATH Other; Tissue  Result Value Ref Range Status   Specimen Description WOUND  Final   Special Requests LEFT THORACOLUMBAR SPEC A  Final   Gram Stain NO WBC SEEN NO ORGANISMS SEEN   Final   Culture   Final    RARE ESCHERICHIA COLI SUSCEPTIBILITIES PERFORMED ON PREVIOUS CULTURE WITHIN THE LAST 5 DAYS. Performed at [facility], [place] [address], [place], [place] [address]    Report Status PENDING  Incomplete  Aerobic/Anaerobic Culture w Gram Stain (surgical/deep wound)     Status: None (Preliminary result)   Collection Time: 01/13/21  9:55 AM   Specimen: PATH Other; Tissue  Result Value Ref Range Status   Specimen Description TISSUE  Final   Special Requests LEFT THORACOLUMBAR  Final   Gram Stain   Final    RARE WBC PRESENT, PREDOMINANTLY MONONUCLEAR NO ORGANISMS SEEN    Culture   Final    RARE ESCHERICHIA COLI SUSCEPTIBILITIES PERFORMED ON PREVIOUS CULTURE WITHIN THE LAST 5 DAYS. Performed at [facility], [place] [address], [place], [place] [address]    Report Status PENDING  Incomplete  Aerobic/Anaerobic Culture w Gram Stain (surgical/deep wound)     Status: None (Preliminary result)   Collection Time: 01/13/21  9:57 AM   Specimen: PATH Other; Tissue  Result Value Ref Range Status   Specimen Description WOUND  Final   Special Requests LEFT THORACOLUMBAR SPEC C  Final   Gram Stain NO WBC SEEN NO ORGANISMS SEEN   Final   Culture   Final    RARE GRAM NEGATIVE RODS SUSCEPTIBILITIES PERFORMED ON PREVIOUS CULTURE WITHIN THE LAST 5 DAYS. Performed at [facility], [place] [address] [place], [place] [address]    Report Status PENDING  Incomplete  Aerobic/Anaerobic Culture w Gram Stain (surgical/deep wound)     Status: None (Preliminary result)   Collection Time: 01/13/21  9:58 AM   Specimen: PATH Other; Tissue  Result Value Ref Range Status   Specimen Description WOUND  Final   Special Requests LEFT THORACOLUMBAR SPEC B   Final   Gram Stain NO WBC SEEN NO ORGANISMS SEEN   Final   Culture   Final    RARE ESCHERICHIA COLI SUSCEPTIBILITIES PERFORMED ON PREVIOUS CULTURE WITHIN THE LAST 5 DAYS. Performed at [facility], [place] [address] [place], [place] [address]    Report Status PENDING  Incomplete     Serology:   Imaging: If present, new imagings (plain films, ct scans, and mri) have been personally visualized and interpreted; radiology reports have been reviewed. Decision making incorporated into the Impression / Recommendations.  7/17 mri thoracolumbar 1. Previous T10 through Sacral spinal fusion with abundant metal hardware artifact.   2. Positive for a Ventral Epidural Abscess in the thoracic spine tracking from the T7 superior endplate inferiorly at least to T10-T11 - where abnormal signal in the disc is highly suspicious for Infectious Discitis. Associated meningeal thickening and enhancement extending from that level cephalad to T4.   3. 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   4. No definite spinal cord edema, although detail of the conus is obscured by hardware artifact.   5. Solitary 3 cm posterior paraspinal fluid collection about the right T12 and L1 spinal rod, although fluid signal more resembles hematoma than abscess.   6. Otherwise extensive postoperative signal changes to the thoracolumbar erector spinae muscles, with ano obvious intramuscular abscess.   7. No definite lumbar epidural abscess. Visible sacrum and SI joints also appear spared.  Jabier Mutton, [facility] for Infectious [place] [phone] pager    01/15/2021, 10:52 AM

## 2021-01-15 NOTE — TOC Progression Note (Signed)
Transition of Care [facility]) - Progression Note    Patient Details  Name: Marco Kennedy MRN: [id number] Date of Birth: [birth year]  [facility]) CM/SW Contact  Leone Haven, RN Phone Number: 01/15/2021, 1:34 PM  Clinical Narrative:    NCM spoke with wife , she has had the iv abx teaching with Jeri Modena.  She wants this NCM to try to get [facility] thru [facility] [employer] in [place], [place] made referral and they unable to take referral for [facility] for IV abx because his insurance [employer] will not let them do it.  NCM asked if it would be ok for [facility] to provide the picc line care for him, he states yes.   Expected Discharge Plan: Home w Home Health Services Barriers to Discharge: Continued Medical Work up  Expected Discharge Plan and Services Expected Discharge Plan: Home w Home Health Services   Discharge Planning Services: CM Consult Post Acute Care Choice: NA Living arrangements for the past 2 months: Single Family Home                   DME Agency: NA       HH Arranged: RN HH Agency:  [facility]) Date HH Agency Contacted: 01/15/21 Time HH Agency Contacted: 1333 Representative spoke with at [facility] Agency: [facility]   Social Determinants of Health (SDOH) Interventions    Readmission Risk Interventions No flowsheet data found.

## 2021-01-16 LAB — CULTURE, BLOOD (ROUTINE X 2)
Culture: NO GROWTH
Special Requests: ADEQUATE

## 2021-01-16 LAB — AEROBIC/ANAEROBIC CULTURE W GRAM STAIN (SURGICAL/DEEP WOUND)

## 2021-01-16 MED ORDER — ENOXAPARIN SODIUM 60 MG/0.6ML IJ SOSY
60.0000 mg | PREFILLED_SYRINGE | Freq: Once | INTRAMUSCULAR | Status: AC
  Administered 2021-01-16: 60 mg via SUBCUTANEOUS
  Filled 2021-01-16: qty 0.6

## 2021-01-16 MED ORDER — HEPARIN SOD (PORK) LOCK FLUSH 100 UNIT/ML IV SOLN
250.0000 [IU] | INTRAVENOUS | Status: AC | PRN
  Administered 2021-01-16: 250 [IU]
  Filled 2021-01-16: qty 2.5

## 2021-01-16 NOTE — Progress Notes (Signed)
Physical Therapy Treatment Patient Details Name: Marco Kennedy MRN: [id number] DOB: September 09, 1950 Today's Date: 01/16/2021    History of Present Illness 70 y/o male admitted 7/16 with a pelvic abscess, presenting with worsening L flank pain ongoing for a week. 7/17 aspiration. 7/19 thoracolumbar wound debridement. PMH: anterior and lateral lumbar fusion w/ pelvic fixation, OSA on BiPAP, THA    PT Comments    Pt received seated EOB with spouse present, agreeable to therapy session and with good participation and tolerance for transfer, gait and stair training. Pt needing up to min guard to stand from elevated surface heights to RW and to perform stairs x7 with BUE support per home setup. Pt SpO2 WNL when pleth waveform appearing on RA and making good progress toward goals. Pt moderately fatigued after stair trial and deferred long walk in hallway, wanting to save his energy for upcoming discharge and stairs into home. Pt continues to benefit from PT services to progress toward functional mobility goals.   Follow Up Recommendations  Home health PT     Equipment Recommendations  None recommended by PT    Recommendations for Other Services       Precautions / Restrictions Precautions Precautions: Fall Restrictions Weight Bearing Restrictions: No    Mobility  Bed Mobility Overal bed mobility: Needs Assistance      General bed mobility comments: pt received up seated EOB, agreeable to OOB mobility    Transfers Overall transfer level: Needs assistance Equipment used: Rolling walker (2 wheeled) Transfers: Sit to/from Stand Sit to Stand: Min guard;+2 safety/equipment         General transfer comment: from elevated EOB, use of momentum strategy needed to achieve upright, and min guard for safety with stand>sit to recliner  Ambulation/Gait Ambulation/Gait assistance: Min guard Gait Distance (Feet): 40 Feet Assistive device: Rolling walker (2 wheeled) Gait Pattern/deviations:  Step-through pattern;Trunk flexed     General Gait Details: Demonstrated trunk flexion, forward head, rounded shoulders during ambulation. Tight grip on RW and poor waveform so difficult to assess SpO2 at times, when pt cued to take standing break and relax hand, SpO2 signal 91-94% on RA, no DOE. Cues for upright posture/gaze and to pick L foot up more due to reduced L dorsiflexion in swing phase.   Stairs Stairs: Yes Stairs assistance: Min guard Stair Management: Two rails;Step to pattern;Forwards Number of Stairs: 7 General stair comments: cues for step sequencing, x2 brief standing breaks to rest between steps, did not need seated break.   Wheelchair Mobility    Modified Rankin (Stroke Patients Only)       Balance Overall balance assessment: Needs assistance Sitting-balance support: Feet supported Sitting balance-Leahy Scale: Good     Standing balance support: Bilateral upper extremity supported Standing balance-Leahy Scale: Poor Standing balance comment: Reliant on RW and external support for safety                  Cognition Arousal/Alertness: Awake/alert Behavior During Therapy: WFL for tasks assessed/performed Overall Cognitive Status: Within Functional Limits for tasks assessed                Exercises Other Exercises Other Exercises: seated BLE AROM: heel/toe raises, LAQ x10 reps ea Other Exercises: HEP handout: [email or web address]: [email or web address]    General Comments General comments (skin integrity, edema, etc.): BO 172/75 (101) pre-mobility, RN aware; HR 81 bpm SpO2 WNL on RA      Pertinent Vitals/Pain Pain Assessment: 0-10 Pain Score: 4  Pain Location: incisional back  Pain Descriptors / Indicators: Grimacing Pain Intervention(s): Monitored during session;Repositioned     PT Goals (current goals can now be found in the care plan section) Acute Rehab PT Goals Patient Stated Goal: Return home PT Goal Formulation: With  patient Time For Goal Achievement: 01/27/21 Progress towards PT goals: Progressing toward goals    Frequency    Min 3X/week      PT Plan Current plan remains appropriate       AM-PAC PT "6 Clicks" Mobility   Outcome Measure  Help needed turning from your back to your side while in a flat bed without using bedrails?: A Lot Help needed moving from lying on your back to sitting on the side of a flat bed without using bedrails?: A Lot Help needed moving to and from a bed to a chair (including a wheelchair)?: A Little Help needed standing up from a chair using your arms (e.g., wheelchair or bedside chair)?: A Little Help needed to walk in hospital room?: A Little Help needed climbing 3-5 steps with a railing? : A Little 6 Click Score: 16    End of Session Equipment Utilized During Treatment: Gait belt Activity Tolerance: Patient tolerated treatment well Patient left: in chair;with call bell/phone within reach;with chair alarm set;with family/visitor present Nurse Communication: Mobility status PT Visit Diagnosis: Muscle weakness (generalized) (M62.81);Other abnormalities of gait and mobility (R26.89)     Time: 3818-2993 PT Time Calculation (min) (ACUTE ONLY): 19 min  Charges:  $Gait Training: 8-22 mins                     Marco Kennedy P., PTA Acute Rehabilitation Services Pager: [phone] Office: [phone]    [place]us [place]ms 01/16/2021, 2:46 PM

## 2021-01-16 NOTE — Progress Notes (Signed)
Arrived to patient's room to instill heparin into the PICC line as IV consult requested. Family member at bedside declined at this time. Secure chat sent to nurse to reconsult after ATB infusion is complete and family is agreeable and understands POC. Tomasita Morrow, RN VAST

## 2021-01-16 NOTE — Progress Notes (Signed)
Unable to finish RED MEWS guideline due to pt going home per MD. Pt's VSS currently stable. MD and pt wished continuing the d/c process. Went over AVS with pt and all questions were answered. D/c iv assess.   Lawson Radar, RN

## 2021-01-16 NOTE — TOC Transition Note (Signed)
Transition of Care [facility]) - CM/SW Discharge Note   Patient Details  Name: Marco Kennedy MRN: [id number] Date of Birth: 28-May-1951  Transition of Care [facility]) CM/SW Contact:  Marco Haven, RN Phone Number: 01/16/2021, 11:37 AM   Clinical Narrative:    Patient is for dc today home with iv abx, Marco Kennedy states patient will receive his  1 pm dose iv abx her in the hospital , then dc home. Marco Kennedy will send the 10 pm dose to patient home .     Final next level of care: Home w Home Health Services Barriers to Discharge: No Barriers Identified   Patient Goals and CMS Choice Patient states their goals for this hospitalization and ongoing recovery are:: return home with [facility] CMS Medicare.gov Compare Post Acute Care list provided to:: Patient Choice offered to / list presented to : Patient  Discharge Placement                       Discharge Plan and Services   Discharge Planning Services: CM Consult Post Acute Care Choice: NA            DME Agency: NA       HH Arranged: RN HH Agency:  Marco Kennedy) Date HH Agency Contacted: 01/15/21 Time HH Agency Contacted: [phone] Representative spoke with at [facility] Agency: Marco Kennedy  Social Determinants of Health (SDOH) Interventions     Readmission Risk Interventions No flowsheet data found.

## 2021-01-16 NOTE — Progress Notes (Signed)
Subjective: Patient reports that he is doing well and is ready to be discharged. No acute events overnight.  Objective: Vital signs in last 24 hours: Temp:  [97.7 F (36.5 C)-98.7 F (37.1 C)] 97.7 F (36.5 C) (07/22 0602) Pulse Rate:  [66-92] 83 (07/21 1939) Resp:  [16-18] 16 (07/22 0602) BP: (127-150)/(61-72) 127/64 (07/22 0602) SpO2:  [93 %-100 %] 100 % (07/22 0602)  Intake/Output from previous day: 07/21 0701 - 07/22 0700 In: 440 [P.O.:240; IV Piggyback:200] Out: 500 [Urine:500] Intake/Output this shift: No intake/output data recorded.  Physical Exam: Patient is awake, A/O X 4, conversant, and in good spirits. He is in NAD, sitting comfortably in his bedside chair and VSS. Speech is fluent and appropriate. Doing well. MAEW with good strength. Sensation to light touch is intact. PERLA, EOMI. CNs grossly intact. Dressing is clean dry intact. Incision is well approximated with no drainage, erythema, or edema. Drain has been removed.   Lab Results: Recent Labs    01/14/21 0256 01/15/21 0600  WBC 18.6* 20.6*  HGB 10.8* 10.8*  HCT 33.2* 33.3*  PLT 250 274   BMET Recent Labs    01/14/21 0256 01/15/21 0600  NA 136 135  K 3.6 3.6  CL 100 102  CO2 27 26  GLUCOSE 146* 154*  BUN 26* 32*  CREATININE 1.12 1.15  CALCIUM 7.7* 7.6*    Studies/Results: [place] EKG SITE RITE  Result Date: 01/15/2021 If Site Rite image not attached, placement could not be confirmed due to current cardiac rhythm.   Assessment/Plan: Patient is post-op day 3 s/p thoracolumbar wound debridement. Blood cultures remain negative x4 and his abscess aspirate culture grew E. Coli. ID on board for antibiotic management. Antibiotic therapy per ID. He is continuing to recover well.  His only complaint is mild incisional discomfort.  Continue PT/OT. Continue working on pain control, mobility and ambulating patient. PICC line and education performed yesterday. Plan for discharge today. We will have him follow up  with [place] in 3 weeks.   LOS: 5 days     Council Mechanic, DNP, NP-C 01/16/2021, 8:12 AM

## 2021-01-16 NOTE — Progress Notes (Signed)
Ok to give Lovenox 60mg  SQ x1 for DVT prophylaxis just in case he is not discharged today per Dr. .  Natale Milch, PharmD, BCIDP, AAHIVP, CPP Infectious Disease Pharmacist 01/16/2021 7:38 AM

## 2021-01-16 NOTE — Discharge Summary (Signed)
Physician Discharge Summary  Marco Kennedy OZH:[id number] DOB: 12-Dec-1950 DOA: 01/10/2021  PCP: Yvone Neu, MD  Admit date: 01/10/2021 Discharge date: 01/16/2021  Admitted From: Home Disposition: Home  Recommendations for Outpatient Follow-up:  Follow up with PCP in 1-2 weeks Please obtain BMP/CBC in one week Please follow up with surgery as scheduled in 3 weeks Please follow-up with infectious disease as scheduled  Home Health: Home health PT RN Equipment/Devices: None  Discharge Condition: Stable CODE STATUS: Full Diet recommendation: Low-salt low-fat diet  Brief/Interim Summary: Marco Kennedy is a 70 y.o. male with medical history significant for back surgery with pelvis fixation in November 2021, OSA on BiPAP, who presented to [place] ED due to sudden onset progressively worsening left flank pain of 1 week duration.  Associated with nausea, vomiting, generalized weakness, subjective fevers and chills and sweats.  CT lumbar spine obtained in the [facility] ED revealed moderate size abscess located near spinal hardware.  Since his back surgery was done by Dr. Vertell Limber, decision was made to transfer to [facility] for neurosurgical assessment.  Seen by neurosurgery who recommended hospitalist admission, they will see in consultation  Sepsis secondary to moderate size abscess located near the pelvic hardware, POA. Status post back surgery/hardware insertion - NeuroSx, ID, and interventional IR following, appreciate insight and recommendations - Status post wound debridement with neurosurgery 01/13/2021 -tolerated well -drain containing serosanguineous fluid -now removed - Pain well controlled - Fluid collection aspirated 01/11/2021, follow cultures -E. coli on multiple cultures -Antibiotics de-escalated to cefazolin per discussion with ID, plan for 6 weeks of treatment, PICC line pending for outpatient treatment for 6 weeks   Intractable nausea and vomiting with elevated  liver chemistries, resolved - Likely secondary to above sepsis given it resolved with IV antibiotics and no other intervention - RUQ - 10 mm gallbladder polyp seen - RUQ benign on exam; labs downtrending with treatment of sepsis   Constipation, POA, resolved -Patient reports 7 days of constipation at the time of admission multiple, now worsened in the setting of narcotics -multiple BMs since admission -Continue MiraLAX at home, especially while on narcotics   Pain induced hypertension Improved with pain control   Hypokalemia Mild, repleted    Hyperglycemia Transient, A1c 5.9 Discontinue sliding scale insulin/hypoglycemic protocol   OSA on BiPAP Continue  Discharge Diagnoses:  Active Problems:   Abscess of back   Vertebral osteomyelitis ([place])   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)    Discharge Instructions  Discharge Instructions     [facility] pharmacist to adjust dose for Vancomycin, Aminoglycosides and other anti-infective therapies as requested by physician.   Complete by: As directed    [facility] to provide Cath Flo 57m   Complete by: As directed    Administer for PICC line occlusion and as ordered by physician for other access device issues.   Anaphylaxis Kit: Provided to treat any anaphylactic reaction to the medication being provided to the patient if First Dose or when requested by physician   Complete by: As directed    Epinephrine 1228mml vial / amp: Administer 0.28m20m0.28ml110mubcutaneously once for moderate to severe anaphylaxis, nurse to call physician and pharmacy when reaction occurs and call 911 if needed for immediate care   Diphenhydramine 50mg66mIV vial: Administer 25-50mg 82mM PRN for first dose reaction, rash, itching, mild reaction, nurse to call physician and pharmacy when reaction occurs   Sodium Chloride 0.9% NS 500ml I39mdminister if needed for hypovolemic blood pressure  drop or as ordered by physician after call to  physician with anaphylactic reaction   Call MD for:  persistant dizziness or light-headedness   Complete by: As directed    Call MD for:  redness, tenderness, or signs of infection (pain, swelling, redness, odor or green/yellow discharge around incision site)   Complete by: As directed    Call MD for:  severe uncontrolled pain   Complete by: As directed    Call MD for:  temperature >100.4   Complete by: As directed    Change dressing on IV access line weekly and PRN   Complete by: As directed    Diet - low sodium heart healthy   Complete by: As directed    Discharge wound care:   Complete by: As directed    Per neuro surgery   Flush IV access with Sodium Chloride 0.9% and Heparin 10 units/ml or 100 units/ml   Complete by: As directed    Home infusion instructions - [facility]   Complete by: As directed    Instructions: Flush IV access with Sodium Chloride 0.9% and Heparin 10units/ml or 100units/ml   Change dressing on IV access line: Weekly and PRN   Instructions Cath Flo 55m: Administer for PICC Line occlusion and as ordered by physician for other access device   [facility] pharmacist to adjust dose for: Vancomycin, Aminoglycosides and other anti-infective therapies as requested by physician   Increase activity slowly   Complete by: As directed    Method of administration may be changed at the discretion of home infusion pharmacist based upon assessment of the patient and/or caregiver's ability to self-administer the medication ordered   Complete by: As directed       Allergies as of 01/16/2021   No Known Allergies      Medication List     TAKE these medications    ceFAZolin  IVPB Commonly known as: ANCEF Inject 2 g into the vein every 8 (eight) hours. Indication:  vertebral osteomyelitis First Dose: No Last Day of Therapy:  02/24/2021 Labs - Once weekly:  CBC/D and BMP, Labs - Every other week:  ESR and CRP Method of administration: IV  Push Method of administration may be changed at the discretion of home infusion pharmacist based upon assessment of the patient and/or caregiver's ability to self-administer the medication ordered.   cholecalciferol 25 MCG (1000 UNIT) tablet Commonly known as: VITAMIN D3 Take 1,000 Units by mouth daily.   diclofenac 75 MG EC tablet Commonly known as: VOLTAREN Take 75 mg by mouth 2 (two) times daily.   multivitamin with minerals Tabs tablet Take 1 tablet by mouth daily.   oxyCODONE 5 MG immediate release tablet Commonly known as: Oxy IR/ROXICODONE Take 1 tablet (5 mg total) by mouth every 6 (six) hours as needed for moderate pain.   polyethylene glycol 17 g packet Commonly known as: MIRALAX / GLYCOLAX Take 17 g by mouth 2 (two) times daily.               Discharge Care Instructions  (From admission, onward)           Start     Ordered   01/16/21 0000  Discharge wound care:       Comments: Per neuro surgery   01/16/21 1035   01/15/21 0000  Change dressing on IV access line weekly and PRN  (Home infusion instructions - [facility] )        01/15/21 1252  No Known Allergies  Consultations: Neurosurgery, infectious disease   Procedures/Studies: MR THORACIC SPINE W WO CONTRAST  Result Date: 01/11/2021 CLINICAL DATA:  70 year old male with fever and "low back abscess". Prior spine surgery in 2021. Approximately 1 week of increasing left side back and flank pain. Found to have large 9.6 x 7.7 x 10.8 cm left flank or pelvic loculated fluid collection at outside facility suspicious for seroma versus abscess EXAM: MRI CERVICAL, THORACIC AND LUMBAR SPINE WITHOUT AND WITH CONTRAST TECHNIQUE: Multiplanar and multiecho pulse sequences of the cervical spine, to include the craniocervical junction and cervicothoracic junction, and thoracic and lumbar spine, were obtained without and with intravenous contrast. CONTRAST:  54m GADAVIST GADOBUTROL 1 MMOL/ML  IV SOLN COMPARISON:  Scoliosis radiographs 08/14/2020 and earlier. FINDINGS: MRI THORACIC SPINE FINDINGS Limited cervical spine imaging: Straightening of cervical lordosis. Generalized decreased T1 bone marrow signal. Thoracic spine segmentation:  Normal on the comparison radiographs. Alignment:  Preserved thoracic kyphosis. Vertebrae: Mild generalized decreased T1 marrow signal in the thoracic spine, although remaining within normal limits from T9 and above. Superimposed T10 through lumbar posterior spinal hardware artifact. No convincing T10 through T12 marrow edema on the thoracic images. Cord: Abnormal dural thickening and enhancement extending from the lower thoracic spine which is affected by hardware artifact cephalad through the T4 level. Beginning at T7 and continuing inferiorly is a ventral epidural fluid collection with rim enhancement measuring up to 6 mm in thickness (at T8 series 27, image 25) which effaces the ventral CSF space at many lower thoracic levels. This continues toward the thoracolumbar junction, although thecal sac detail is limited by hardware artifact at T11 and T12. No definite associated cord edema. Above T4 the thecal sac and dura appear normal. Conus medullaris is obscured. Paraspinal and other soft tissues: Muscle postoperative changes. Suggesting high protein content or blood. There is surrounding right thoracic erector spinae muscle edema, superimposed on chronic postoperative changes. Small to moderate layering bilateral pleural effusions are visible in the chest. Disc levels: Disc spaces are relatively normal for age above T9. Beginning in the midthoracic spine and continuing inferiorly is moderate and ultimately severe bilateral posterior facet hypertrophy. Beginning at T8-T9 there is multifactorial mild spinal stenosis in conjunction with the ventral epidural abscess. Similar mild spinal stenosis at T9-T10. The T10-T11 disc is abnormally T2 and STIR hyperintense, although  evaluation for enhancement is limited due to hardware artifact. Possible mild endplate erosions there. Mild to moderate spinal stenosis there in part due to mass effect from moderate to severe posterior element hypertrophy on the right (series 27, image 32). T11-T12: disc space loss with disc bulging and endplate spurring. Up to moderate multifactorial spinal stenosis there. T12-L1 level is slightly better depicted on the lumbar images. MRI LUMBAR SPINE FINDINGS Segmentation:  Normal as seen on the comparison radiographs. Alignment:  Preserved lumbar lordosis. Vertebrae: Diffuse hardware susceptibility artifact, with interbody implants in addition to the posterior spinal hardware from L2-L3 through L5-S1. No definite lumbar marrow edema. No convincing sacral marrow edema. Grossly intact SI joints. Conus medullaris and cauda equina: Conus is obscured by hardware artifact. Cauda equina roots so in the lower lumbar thecal sac appear normal. Hardware artifact limits evaluation for abnormal dural thickening and enhancement in the lumbar spine, but the dura below L3 appears normal. Paraspinal and other soft tissues: Posterior paraspinal muscle postoperative changes. A small roughly 3 cm collection along the right side posterior T12 and L1 hardware has intrinsic T1 and T2 hyperintensity as above. Otherwise there  is thin midline subcutaneous fluid or granulation tissue which is not rim enhancing and fairly dark on T2. No psoas muscle abscess identified. Disc levels: T12-L1: This level remains fairly obscured due to hardware artifact, but on series 30, image 7 there does appear to be at least mild spinal stenosis. Unclear whether the epidural collection extends to this level. L1-L2: Obscured on axial images but on sagittal series 30, image 8 there is no strong evidence of epidural abscess or spinal stenosis. L2-L3: Obscured on axial images. Sagittal images demonstrate interbody implant with no convincing epidural abscess.  There does appear to be mild spinal stenosis. L3-L4: Sequelae of decompression and posterior, interbody fusion here. No definite spinal stenosis or epidural abscess. L4-L5: Decompression and fusion. Left side posterior element hypertrophy results in mild left L4 foraminal stenosis. No spinal stenosis or epidural abscess. L5-S1: Status post decompression and fusion. No stenosis or abscess. IMPRESSION: 1. Previous T10 through Sacral spinal fusion with abundant metal hardware artifact. 2. Positive for a Ventral Epidural Abscess in the thoracic spine tracking from the T7 superior endplate inferiorly at least to T10-T11 - where abnormal signal in the disc is highly suspicious for Infectious Discitis. Associated meningeal thickening and enhancement extending from that level cephalad to T4. 3. 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 4. No definite spinal cord edema, although detail of the conus is obscured by hardware artifact. 5. Solitary 3 cm posterior paraspinal fluid collection about the right T12 and L1 spinal rod, although fluid signal more resembles hematoma than abscess. 6. Otherwise extensive postoperative signal changes to the thoracolumbar erector spinae muscles, with ano obvious intramuscular abscess. 7. No definite lumbar epidural abscess. Visible sacrum and SI joints also appear spared. Electronically Signed   By: Genevie Ann M.D.   On: 01/11/2021 04:44   MR Lumbar Spine W Wo Contrast  Result Date: 01/11/2021 CLINICAL DATA:  70 year old male with fever and "low back abscess". Prior spine surgery in 2021. Approximately 1 week of increasing left side back and flank pain. Found to have large 9.6 x 7.7 x 10.8 cm left flank or pelvic loculated fluid collection at outside facility suspicious for seroma versus abscess EXAM: MRI CERVICAL, THORACIC AND LUMBAR SPINE WITHOUT AND WITH CONTRAST TECHNIQUE: Multiplanar and  multiecho pulse sequences of the cervical spine, to include the craniocervical junction and cervicothoracic junction, and thoracic and lumbar spine, were obtained without and with intravenous contrast. CONTRAST:  58m GADAVIST GADOBUTROL 1 MMOL/ML IV SOLN COMPARISON:  Scoliosis radiographs 08/14/2020 and earlier. FINDINGS: MRI THORACIC SPINE FINDINGS Limited cervical spine imaging: Straightening of cervical lordosis. Generalized decreased T1 bone marrow signal. Thoracic spine segmentation:  Normal on the comparison radiographs. Alignment:  Preserved thoracic kyphosis. Vertebrae: Mild generalized decreased T1 marrow signal in the thoracic spine, although remaining within normal limits from T9 and above. Superimposed T10 through lumbar posterior spinal hardware artifact. No convincing T10 through T12 marrow edema on the thoracic images. Cord: Abnormal dural thickening and enhancement extending from the lower thoracic spine which is affected by hardware artifact cephalad through the T4 level. Beginning at T7 and continuing inferiorly is a ventral epidural fluid collection with rim enhancement measuring up to 6 mm in thickness (at T8 series 27, image 25) which effaces the ventral CSF space at many lower thoracic levels. This continues toward the thoracolumbar junction, although thecal sac detail is limited by hardware artifact at T11 and T12. No definite associated cord  edema. Above T4 the thecal sac and dura appear normal. Conus medullaris is obscured. Paraspinal and other soft tissues: Muscle postoperative changes. Suggesting high protein content or blood. There is surrounding right thoracic erector spinae muscle edema, superimposed on chronic postoperative changes. Small to moderate layering bilateral pleural effusions are visible in the chest. Disc levels: Disc spaces are relatively normal for age above T9. Beginning in the midthoracic spine and continuing inferiorly is moderate and ultimately severe bilateral  posterior facet hypertrophy. Beginning at T8-T9 there is multifactorial mild spinal stenosis in conjunction with the ventral epidural abscess. Similar mild spinal stenosis at T9-T10. The T10-T11 disc is abnormally T2 and STIR hyperintense, although evaluation for enhancement is limited due to hardware artifact. Possible mild endplate erosions there. Mild to moderate spinal stenosis there in part due to mass effect from moderate to severe posterior element hypertrophy on the right (series 27, image 32). T11-T12: disc space loss with disc bulging and endplate spurring. Up to moderate multifactorial spinal stenosis there. T12-L1 level is slightly better depicted on the lumbar images. MRI LUMBAR SPINE FINDINGS Segmentation:  Normal as seen on the comparison radiographs. Alignment:  Preserved lumbar lordosis. Vertebrae: Diffuse hardware susceptibility artifact, with interbody implants in addition to the posterior spinal hardware from L2-L3 through L5-S1. No definite lumbar marrow edema. No convincing sacral marrow edema. Grossly intact SI joints. Conus medullaris and cauda equina: Conus is obscured by hardware artifact. Cauda equina roots so in the lower lumbar thecal sac appear normal. Hardware artifact limits evaluation for abnormal dural thickening and enhancement in the lumbar spine, but the dura below L3 appears normal. Paraspinal and other soft tissues: Posterior paraspinal muscle postoperative changes. A small roughly 3 cm collection along the right side posterior T12 and L1 hardware has intrinsic T1 and T2 hyperintensity as above. Otherwise there is thin midline subcutaneous fluid or granulation tissue which is not rim enhancing and fairly dark on T2. No psoas muscle abscess identified. Disc levels: T12-L1: This level remains fairly obscured due to hardware artifact, but on series 30, image 7 there does appear to be at least mild spinal stenosis. Unclear whether the epidural collection extends to this level.  L1-L2: Obscured on axial images but on sagittal series 30, image 8 there is no strong evidence of epidural abscess or spinal stenosis. L2-L3: Obscured on axial images. Sagittal images demonstrate interbody implant with no convincing epidural abscess. There does appear to be mild spinal stenosis. L3-L4: Sequelae of decompression and posterior, interbody fusion here. No definite spinal stenosis or epidural abscess. L4-L5: Decompression and fusion. Left side posterior element hypertrophy results in mild left L4 foraminal stenosis. No spinal stenosis or epidural abscess. L5-S1: Status post decompression and fusion. No stenosis or abscess. IMPRESSION: 1. Previous T10 through Sacral spinal fusion with abundant metal hardware artifact. 2. Positive for a Ventral Epidural Abscess in the thoracic spine tracking from the T7 superior endplate inferiorly at least to T10-T11 - where abnormal signal in the disc is highly suspicious for Infectious Discitis. Associated meningeal thickening and enhancement extending from that level cephalad to T4. 3. 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 4. No definite spinal cord edema, although detail of the conus is obscured by hardware artifact. 5. Solitary 3 cm posterior paraspinal fluid collection about the right T12 and L1 spinal rod, although fluid signal more resembles hematoma than abscess. 6. Otherwise extensive postoperative signal changes to the thoracolumbar erector spinae muscles,  with ano obvious intramuscular abscess. 7. No definite lumbar epidural abscess. Visible sacrum and SI joints also appear spared. Electronically Signed   By: Genevie Ann M.D.   On: 01/11/2021 04:44   DG Chest Portable 1 View  Result Date: 01/10/2021 CLINICAL DATA:  Fever, lower back abscess EXAM: PORTABLE CHEST 1 VIEW COMPARISON:  11/13/2007 FINDINGS: Single frontal view of the chest demonstrates an  unremarkable cardiac silhouette. No airspace disease, effusion, or pneumothorax. Postsurgical changes are seen at the thoracolumbar junction. No acute displaced fractures. IMPRESSION: 1. No acute intrathoracic process. Electronically Signed   By: Randa Ngo M.D.   On: 01/10/2021 22:58   [place] EKG SITE RITE  Result Date: 01/15/2021 If Site Rite image not attached, placement could not be confirmed due to current cardiac rhythm.  US Abdomen Limited RUQ (LIVER/GB)  Result Date: 01/11/2021 CLINICAL DATA:  Elevated transaminase EXAM: ULTRASOUND ABDOMEN LIMITED RIGHT UPPER QUADRANT COMPARISON:  None. FINDINGS: Gallbladder: 10 mm gallbladder polyp, although without vascularity. No gallstones or gallbladder wall thickening. Negative sonographic Murphy's sign. Common bile duct: Diameter: 4 mm Liver: Hyperechoic hepatic parenchyma. No focal hepatic lesion is seen. Portal vein is patent on color Doppler imaging with normal direction of blood flow towards the liver. Other: None. IMPRESSION: Hyperechoic hepatic parenchyma, suggesting hepatic steatosis. 10 mm gallbladder polyp. Given size, outpatient surgical consultation is suggested for consideration of elective cholecystectomy. If a conservative approach is desired, this is amenable to annual follow-up ultrasound. Electronically Signed   By: Julian Hy M.D.   On: 01/11/2021 01:57     Subjective: No acute issues or events overnight denies nausea vomiting diarrhea constipation headache fevers chills chest pain shortness of breath   Discharge Exam: Vitals:   01/16/21 1357 01/16/21 1455  BP: (!) 146/84 (!) 151/67  Pulse: 100 92  Resp: (!) 22 (!) 21  Temp: (!) 102 F (38.9 C) 99.3 F (37.4 C)  SpO2: 98% 93%   Vitals:   01/16/21 1203 01/16/21 1323 01/16/21 1357 01/16/21 1455  BP: (!) 172/75 (!) 158/90 (!) 146/84 (!) 151/67  Pulse: 86 100 100 92  Resp: 18 (!) 21 (!) 22 (!) 21  Temp: 98.4 F (36.9 C) (!) 96.5 F (35.8 C) (!) 102 F (38.9 C) 99.3  F (37.4 C)  TempSrc: Oral Rectal  Oral  SpO2: 100% 94% 98% 93%  Weight:      Height:        General: Pt is alert, awake, not in acute distress Cardiovascular: RRR, S1/S2 +, no rubs, no gallops Respiratory: CTA bilaterally, no wheezing, no rhonchi Abdominal: Soft, NT, ND, bowel sounds + Extremities: no edema, no cyanosis    The results of significant diagnostics from this hospitalization (including imaging, microbiology, ancillary and laboratory) are listed below for reference.     Microbiology: Recent Results (from the past 240 hour(s))  Blood culture (routine x 2)     Status: None   Collection Time: 01/10/21 10:18 PM   Specimen: BLOOD LEFT FOREARM  Result Value Ref Range Status   Specimen Description BLOOD LEFT FOREARM  Final   Special Requests   Final    BOTTLES DRAWN AEROBIC AND ANAEROBIC Blood Culture adequate volume   Culture   Final    NO GROWTH 5 DAYS Performed at [facility], [address], [place], [place] [address]    Report Status 01/15/2021 FINAL  Final  Blood culture (routine x 2)     Status: None   Collection Time: 01/10/21 10:18 PM  Specimen: BLOOD  Result Value Ref Range Status   Specimen Description BLOOD RIGHT ANTECUBITAL  Final   Special Requests   Final    BOTTLES DRAWN AEROBIC AND ANAEROBIC Blood Culture adequate volume   Culture   Final    NO GROWTH 5 DAYS Performed at [facility], [address], [place], [place] [address]    Report Status 01/16/2021 FINAL  Final  Aerobic/Anaerobic Culture w Gram Stain (surgical/deep wound)     Status: None   Collection Time: 01/11/21  3:02 PM   Specimen: Wound  Result Value Ref Range Status   Specimen Description WOUND  Final   Special Requests ABSCESS OF BACK  Final   Gram Stain   Final    FEW WBC PRESENT, PREDOMINANTLY PMN NO ORGANISMS SEEN    Culture   Final    RARE ESCHERICHIA COLI NO ANAEROBES ISOLATED Performed at [facility], [place] [address], [place], [place] [address]     Report Status 01/16/2021 FINAL  Final   Organism ID, Bacteria ESCHERICHIA COLI  Final      Susceptibility   Escherichia coli - MIC*    AMPICILLIN <=2 SENSITIVE Sensitive     CEFAZOLIN <=4 SENSITIVE Sensitive     CEFEPIME <=0.12 SENSITIVE Sensitive     CEFTAZIDIME <=1 SENSITIVE Sensitive     CEFTRIAXONE <=0.25 SENSITIVE Sensitive     CIPROFLOXACIN <=0.25 SENSITIVE Sensitive     GENTAMICIN <=1 SENSITIVE Sensitive     IMIPENEM <=0.25 SENSITIVE Sensitive     TRIMETH/SULFA <=20 SENSITIVE Sensitive     AMPICILLIN/SULBACTAM <=2 SENSITIVE Sensitive     PIP/TAZO <=4 SENSITIVE Sensitive     * RARE ESCHERICHIA COLI  SARS Coronavirus 2 by RT PCR (hospital order, performed in [place] hospital lab) Nasopharyngeal Nasopharyngeal Swab     Status: None   Collection Time: 01/13/21  7:53 AM   Specimen: Nasopharyngeal Swab  Result Value Ref Range Status   SARS Coronavirus 2 NEGATIVE NEGATIVE Final    Comment: (NOTE) SARS-CoV-2 target nucleic acids are NOT DETECTED.  The SARS-CoV-2 RNA is generally detectable in upper and lower respiratory specimens during the acute phase of infection. The lowest concentration of SARS-CoV-2 viral copies this assay can detect is 250 copies / mL. A negative result does not preclude SARS-CoV-2 infection and should not be used as the sole basis for treatment or other patient management decisions.  A negative result may occur with improper specimen collection / handling, submission of specimen other than nasopharyngeal swab, presence of viral mutation(s) within the areas targeted by this assay, and inadequate number of viral copies (<250 copies / mL). A negative result must be combined with clinical observations, patient history, and epidemiological information.  Fact Sheet for Patients:   StrictlyIdeas.no  Fact Sheet for Healthcare Providers: BankingDealers.co.za  This test is not yet approved or  cleared by the  [place] FDA and has been authorized for detection and/or diagnosis of SARS-CoV-2 by FDA under an Emergency Use Authorization (EUA).  This EUA will remain in effect (meaning this test can be used) for the duration of the COVID-19 declaration under Section 564(b)(1) of the Act, 21 U.S.C. section 360bbb-3(b)(1), unless the authorization is terminated or revoked sooner.  Performed at [facility], [place] [address] [place], [place] [address]   Aerobic/Anaerobic Culture w Gram Stain (surgical/deep wound)     Status: None (Preliminary result)   Collection Time: 01/13/21  9:48 AM   Specimen: PATH Other; Tissue  Result Value Ref Range Status   Specimen Description WOUND  Final   Special Requests LEFT THORACOLUMBAR SPEC A  Final   Gram Stain   Final    NO WBC SEEN NO ORGANISMS SEEN Performed at [facility], [address], [place], [place] [address]    Culture   Final    RARE ESCHERICHIA COLI SUSCEPTIBILITIES PERFORMED ON PREVIOUS CULTURE WITHIN THE LAST 5 DAYS. NO ANAEROBES ISOLATED; CULTURE IN PROGRESS FOR 5 DAYS    Report Status PENDING  Incomplete  Aerobic/Anaerobic Culture w Gram Stain (surgical/deep wound)     Status: None (Preliminary result)   Collection Time: 01/13/21  9:55 AM   Specimen: PATH Other; Tissue  Result Value Ref Range Status   Specimen Description TISSUE  Final   Special Requests LEFT THORACOLUMBAR  Final   Gram Stain   Final    RARE WBC PRESENT, PREDOMINANTLY MONONUCLEAR NO ORGANISMS SEEN Performed at [facility], [address], [place], [place] [address]    Culture   Final    RARE ESCHERICHIA COLI SUSCEPTIBILITIES PERFORMED ON PREVIOUS CULTURE WITHIN THE LAST 5 DAYS. NO ANAEROBES ISOLATED; CULTURE IN PROGRESS FOR 5 DAYS    Report Status PENDING  Incomplete  Aerobic/Anaerobic Culture w Gram Stain (surgical/deep wound)     Status: None (Preliminary result)   Collection Time: 01/13/21  9:57 AM   Specimen: PATH Other; Tissue  Result  Value Ref Range Status   Specimen Description WOUND  Final   Special Requests LEFT THORACOLUMBAR SPEC C  Final   Gram Stain   Final    NO WBC SEEN NO ORGANISMS SEEN Performed at [facility], [address] [place], [place] [address]    Culture   Final    RARE GRAM NEGATIVE RODS SUSCEPTIBILITIES PERFORMED ON PREVIOUS CULTURE WITHIN THE LAST 5 DAYS. NO ANAEROBES ISOLATED; CULTURE IN PROGRESS FOR 5 DAYS    Report Status PENDING  Incomplete  Aerobic/Anaerobic Culture w Gram Stain (surgical/deep wound)     Status: None (Preliminary result)   Collection Time: 01/13/21  9:58 AM   Specimen: PATH Other; Tissue  Result Value Ref Range Status   Specimen Description WOUND  Final   Special Requests LEFT THORACOLUMBAR SPEC B  Final   Gram Stain   Final    NO WBC SEEN NO ORGANISMS SEEN Performed at [facility], [address], [place], [place] [address]    Culture   Final    RARE ESCHERICHIA COLI SUSCEPTIBILITIES PERFORMED ON PREVIOUS CULTURE WITHIN THE LAST 5 DAYS. NO ANAEROBES ISOLATED; CULTURE IN PROGRESS FOR 5 DAYS    Report Status PENDING  Incomplete     Labs: BNP (last 3 results) No results for input(s): BNP in the last 8760 hours. Basic Metabolic Panel: Recent Labs  Lab 01/11/21 0500 01/11/21 0507 01/12/21 0436 01/13/21 0048 01/14/21 0256 01/15/21 0600  NA 135  --  134* 135 136 135  K 3.3*  --  3.9 3.5 3.6 3.6  CL 103  --  101 100 100 102  CO2 24  --  24 25 27 26   GLUCOSE 163*  --  109* 121* 146* 154*  BUN 20  --  20 21 26* 32*  CREATININE 0.94  --  0.98 0.91 1.12 1.15  CALCIUM 7.7*  --  7.7* 7.7* 7.7* 7.6*  MG  --  1.9  --   --   --   --   PHOS  --  2.7  --   --   --   --    Liver Function Tests: Recent Labs  Lab 01/11/21 0500 01/12/21 0436 01/13/21 0048 01/14/21 0256 01/15/21 0600  AST 122* 90* 131* 137* 66*  ALT 103* 78* 101* 98* 70*  ALKPHOS 103 98 94 87 81  BILITOT 1.9* 1.7* 1.7* 1.4* 1.3*  PROT 5.5* 5.1* 5.3* 4.9* 4.8*  ALBUMIN 2.3*  2.0* 2.0* 1.9* 1.9*   Recent Labs  Lab 01/10/21 2218  LIPASE 28   No results for input(s): AMMONIA in the last 168 hours. CBC: Recent Labs  Lab 01/10/21 2218 01/11/21 0500 01/12/21 0436 01/13/21 0048 01/14/21 0256 01/15/21 0600  WBC 15.1* 15.9* 17.8* 20.3* 18.6* 20.6*  NEUTROABS 12.7*  --   --   --   --   --   HGB 13.1 12.1* 11.2* 11.2* 10.8* 10.8*  HCT 41.2 36.8* 34.9* 34.3* 33.2* 33.3*  MCV 87.1 85.6 87.5 84.9 86.2 87.4  PLT 173 177 213 253 250 274   Cardiac Enzymes: No results for input(s): CKTOTAL, CKMB, CKMBINDEX, TROPONINI in the last 168 hours. BNP: Invalid input(s): POCBNP CBG: Recent Labs  Lab 01/13/21 0015 01/13/21 0342 01/13/21 0749 01/13/21 1052 01/13/21 1158  GLUCAP 122* 124* 121* 126* 138*   D-Dimer No results for input(s): DDIMER in the last 72 hours. Hgb A1c No results for input(s): HGBA1C in the last 72 hours. Lipid Profile No results for input(s): CHOL, HDL, LDLCALC, TRIG, CHOLHDL, LDLDIRECT in the last 72 hours. Thyroid function studies No results for input(s): TSH, T4TOTAL, T3FREE, THYROIDAB in the last 72 hours.  Invalid input(s): FREET3 Anemia work up No results for input(s): VITAMINB12, FOLATE, FERRITIN, TIBC, IRON, RETICCTPCT in the last 72 hours. Urinalysis    Component Value Date/Time   COLORURINE AMBER (A) 01/11/2021 0200   APPEARANCEUR HAZY (A) 01/11/2021 0200   LABSPEC 1.033 (H) 01/11/2021 0200   PHURINE 5.0 01/11/2021 0200   GLUCOSEU NEGATIVE 01/11/2021 0200   HGBUR SMALL (A) 01/11/2021 0200   BILIRUBINUR NEGATIVE 01/11/2021 0200   KETONESUR NEGATIVE 01/11/2021 0200   PROTEINUR 30 (A) 01/11/2021 0200   UROBILINOGEN 0.2 11/13/2007 1336   NITRITE NEGATIVE 01/11/2021 0200   LEUKOCYTESUR NEGATIVE 01/11/2021 0200   Sepsis Labs Invalid input(s): PROCALCITONIN,  WBC,  LACTICIDVEN Microbiology Recent Results (from the past 240 hour(s))  Blood culture (routine x 2)     Status: None   Collection Time: 01/10/21 10:18 PM    Specimen: BLOOD LEFT FOREARM  Result Value Ref Range Status   Specimen Description BLOOD LEFT FOREARM  Final   Special Requests   Final    BOTTLES DRAWN AEROBIC AND ANAEROBIC Blood Culture adequate volume   Culture   Final    NO GROWTH 5 DAYS Performed at [facility], [address] [place], [place] [address]    Report Status 01/15/2021 FINAL  Final  Blood culture (routine x 2)     Status: None   Collection Time: 01/10/21 10:18 PM   Specimen: BLOOD  Result Value Ref Range Status   Specimen Description BLOOD RIGHT ANTECUBITAL  Final   Special Requests   Final    BOTTLES DRAWN AEROBIC AND ANAEROBIC Blood Culture adequate volume   Culture   Final    NO GROWTH 5 DAYS Performed at [facility], [place] [address], [place], [place] [address]    Report Status 01/16/2021 FINAL  Final  Aerobic/Anaerobic Culture w Gram Stain (surgical/deep wound)     Status: None  Collection Time: 01/11/21  3:02 PM   Specimen: Wound  Result Value Ref Range Status   Specimen Description WOUND  Final   Special Requests ABSCESS OF BACK  Final   Gram Stain   Final    FEW WBC PRESENT, PREDOMINANTLY PMN NO ORGANISMS SEEN    Culture   Final    RARE ESCHERICHIA COLI NO ANAEROBES ISOLATED Performed at [facility], [place] [address], [place], [place] [address]    Report Status 01/16/2021 FINAL  Final   Organism ID, Bacteria ESCHERICHIA COLI  Final      Susceptibility   Escherichia coli - MIC*    AMPICILLIN <=2 SENSITIVE Sensitive     CEFAZOLIN <=4 SENSITIVE Sensitive     CEFEPIME <=0.12 SENSITIVE Sensitive     CEFTAZIDIME <=1 SENSITIVE Sensitive     CEFTRIAXONE <=0.25 SENSITIVE Sensitive     CIPROFLOXACIN <=0.25 SENSITIVE Sensitive     GENTAMICIN <=1 SENSITIVE Sensitive     IMIPENEM <=0.25 SENSITIVE Sensitive     TRIMETH/SULFA <=20 SENSITIVE Sensitive     AMPICILLIN/SULBACTAM <=2 SENSITIVE Sensitive     PIP/TAZO <=4 SENSITIVE Sensitive     * RARE ESCHERICHIA COLI  SARS Coronavirus 2 by  RT PCR (hospital order, performed in [place] hospital lab) Nasopharyngeal Nasopharyngeal Swab     Status: None   Collection Time: 01/13/21  7:53 AM   Specimen: Nasopharyngeal Swab  Result Value Ref Range Status   SARS Coronavirus 2 NEGATIVE NEGATIVE Final    Comment: (NOTE) SARS-CoV-2 target nucleic acids are NOT DETECTED.  The SARS-CoV-2 RNA is generally detectable in upper and lower respiratory specimens during the acute phase of infection. The lowest concentration of SARS-CoV-2 viral copies this assay can detect is 250 copies / mL. A negative result does not preclude SARS-CoV-2 infection and should not be used as the sole basis for treatment or other patient management decisions.  A negative result may occur with improper specimen collection / handling, submission of specimen other than nasopharyngeal swab, presence of viral mutation(s) within the areas targeted by this assay, and inadequate number of viral copies (<250 copies / mL). A negative result must be combined with clinical observations, patient history, and epidemiological information.  Fact Sheet for Patients:   StrictlyIdeas.no  Fact Sheet for Healthcare Providers: BankingDealers.co.za  This test is not yet approved or  cleared by the [place] FDA and has been authorized for detection and/or diagnosis of SARS-CoV-2 by FDA under an Emergency Use Authorization (EUA).  This EUA will remain in effect (meaning this test can be used) for the duration of the COVID-19 declaration under Section 564(b)(1) of the Act, 21 U.S.C. section 360bbb-3(b)(1), unless the authorization is terminated or revoked sooner.  Performed at [facility], [place] [address], [place], [place] [address]   Aerobic/Anaerobic Culture w Gram Stain (surgical/deep wound)     Status: None (Preliminary result)   Collection Time: 01/13/21  9:48 AM   Specimen: PATH Other; Tissue  Result Value Ref  Range Status   Specimen Description WOUND  Final   Special Requests LEFT THORACOLUMBAR SPEC A  Final   Gram Stain   Final    NO WBC SEEN NO ORGANISMS SEEN Performed at [facility], [address], [place], [place] [address]    Culture   Final    RARE ESCHERICHIA COLI SUSCEPTIBILITIES PERFORMED ON PREVIOUS CULTURE WITHIN THE LAST 5 DAYS. NO ANAEROBES ISOLATED; CULTURE IN PROGRESS FOR 5 DAYS  Report Status PENDING  Incomplete  Aerobic/Anaerobic Culture w Gram Stain (surgical/deep wound)     Status: None (Preliminary result)   Collection Time: 01/13/21  9:55 AM   Specimen: PATH Other; Tissue  Result Value Ref Range Status   Specimen Description TISSUE  Final   Special Requests LEFT THORACOLUMBAR  Final   Gram Stain   Final    RARE WBC PRESENT, PREDOMINANTLY MONONUCLEAR NO ORGANISMS SEEN Performed at [facility], [address] [place], [place] [address]    Culture   Final    RARE ESCHERICHIA COLI SUSCEPTIBILITIES PERFORMED ON PREVIOUS CULTURE WITHIN THE LAST 5 DAYS. NO ANAEROBES ISOLATED; CULTURE IN PROGRESS FOR 5 DAYS    Report Status PENDING  Incomplete  Aerobic/Anaerobic Culture w Gram Stain (surgical/deep wound)     Status: None (Preliminary result)   Collection Time: 01/13/21  9:57 AM   Specimen: PATH Other; Tissue  Result Value Ref Range Status   Specimen Description WOUND  Final   Special Requests LEFT THORACOLUMBAR SPEC C  Final   Gram Stain   Final    NO WBC SEEN NO ORGANISMS SEEN Performed at [facility], [address] [place], [place] [address]    Culture   Final    RARE GRAM NEGATIVE RODS SUSCEPTIBILITIES PERFORMED ON PREVIOUS CULTURE WITHIN THE LAST 5 DAYS. NO ANAEROBES ISOLATED; CULTURE IN PROGRESS FOR 5 DAYS    Report Status PENDING  Incomplete  Aerobic/Anaerobic Culture w Gram Stain (surgical/deep wound)     Status: None (Preliminary result)   Collection Time: 01/13/21  9:58 AM   Specimen: PATH Other; Tissue  Result Value Ref  Range Status   Specimen Description WOUND  Final   Special Requests LEFT THORACOLUMBAR SPEC B  Final   Gram Stain   Final    NO WBC SEEN NO ORGANISMS SEEN Performed at [facility], [address], [place], [place] [address]    Culture   Final    RARE ESCHERICHIA COLI SUSCEPTIBILITIES PERFORMED ON PREVIOUS CULTURE WITHIN THE LAST 5 DAYS. NO ANAEROBES ISOLATED; CULTURE IN PROGRESS FOR 5 DAYS    Report Status PENDING  Incomplete     Time coordinating discharge: Over 30 minutes  SIGNED:   Little Ishikawa, DO [facility] 01/16/2021, 3:09 PM Pager   If 7PM-7AM, please contact night-coverage [email or web address]

## 2021-01-18 LAB — AEROBIC/ANAEROBIC CULTURE W GRAM STAIN (SURGICAL/DEEP WOUND)
Gram Stain: NONE SEEN
Gram Stain: NONE SEEN
Gram Stain: NONE SEEN

## 2021-01-20 ENCOUNTER — Encounter (HOSPITAL_COMMUNITY): Payer: Self-pay | Admitting: Internal Medicine

## 2021-01-20 ENCOUNTER — Inpatient Hospital Stay (HOSPITAL_COMMUNITY)
Admission: EM | Admit: 2021-01-20 | Discharge: 2021-02-03 | DRG: 871 | Disposition: A | Discharge status: Rehabilitation Facility | Payer: BC Managed Care – PPO | Source: Other Acute Inpatient Hospital | Attending: Internal Medicine | Admitting: Internal Medicine

## 2021-01-20 ENCOUNTER — Inpatient Hospital Stay (HOSPITAL_COMMUNITY): Payer: BC Managed Care – PPO

## 2021-01-20 DIAGNOSIS — R7303 Prediabetes: Secondary | ICD-10-CM | POA: Diagnosis not present

## 2021-01-20 DIAGNOSIS — J9601 Acute respiratory failure with hypoxia: Secondary | ICD-10-CM | POA: Diagnosis present

## 2021-01-20 DIAGNOSIS — Z9989 Dependence on other enabling machines and devices: Secondary | ICD-10-CM | POA: Diagnosis not present

## 2021-01-20 DIAGNOSIS — M4126 Other idiopathic scoliosis, lumbar region: Secondary | ICD-10-CM | POA: Diagnosis present

## 2021-01-20 DIAGNOSIS — J918 Pleural effusion in other conditions classified elsewhere: Secondary | ICD-10-CM | POA: Diagnosis present

## 2021-01-20 DIAGNOSIS — M4620 Osteomyelitis of vertebra, site unspecified: Secondary | ICD-10-CM | POA: Diagnosis not present

## 2021-01-20 DIAGNOSIS — Z79899 Other long term (current) drug therapy: Secondary | ICD-10-CM | POA: Diagnosis not present

## 2021-01-20 DIAGNOSIS — D649 Anemia, unspecified: Secondary | ICD-10-CM | POA: Diagnosis not present

## 2021-01-20 DIAGNOSIS — K6812 Psoas muscle abscess: Secondary | ICD-10-CM | POA: Diagnosis present

## 2021-01-20 DIAGNOSIS — K5900 Constipation, unspecified: Secondary | ICD-10-CM | POA: Diagnosis present

## 2021-01-20 DIAGNOSIS — R7401 Elevation of levels of liver transaminase levels: Secondary | ICD-10-CM | POA: Diagnosis not present

## 2021-01-20 DIAGNOSIS — Z981 Arthrodesis status: Secondary | ICD-10-CM

## 2021-01-20 DIAGNOSIS — E8770 Fluid overload, unspecified: Secondary | ICD-10-CM | POA: Diagnosis present

## 2021-01-20 DIAGNOSIS — G4730 Sleep apnea, unspecified: Secondary | ICD-10-CM | POA: Diagnosis present

## 2021-01-20 DIAGNOSIS — Y92239 Unspecified place in hospital as the place of occurrence of the external cause: Secondary | ICD-10-CM | POA: Diagnosis not present

## 2021-01-20 DIAGNOSIS — M109 Gout, unspecified: Secondary | ICD-10-CM | POA: Diagnosis present

## 2021-01-20 DIAGNOSIS — I38 Endocarditis, valve unspecified: Secondary | ICD-10-CM | POA: Diagnosis not present

## 2021-01-20 DIAGNOSIS — M4624 Osteomyelitis of vertebra, thoracic region: Secondary | ICD-10-CM | POA: Diagnosis present

## 2021-01-20 DIAGNOSIS — G8929 Other chronic pain: Secondary | ICD-10-CM | POA: Diagnosis present

## 2021-01-20 DIAGNOSIS — M8609 Acute hematogenous osteomyelitis, multiple sites: Secondary | ICD-10-CM | POA: Diagnosis not present

## 2021-01-20 DIAGNOSIS — Z9689 Presence of other specified functional implants: Secondary | ICD-10-CM | POA: Diagnosis not present

## 2021-01-20 DIAGNOSIS — R601 Generalized edema: Secondary | ICD-10-CM | POA: Diagnosis present

## 2021-01-20 DIAGNOSIS — E871 Hypo-osmolality and hyponatremia: Secondary | ICD-10-CM | POA: Diagnosis present

## 2021-01-20 DIAGNOSIS — M4644 Discitis, unspecified, thoracic region: Secondary | ICD-10-CM | POA: Diagnosis present

## 2021-01-20 DIAGNOSIS — R6881 Early satiety: Secondary | ICD-10-CM | POA: Diagnosis present

## 2021-01-20 DIAGNOSIS — J90 Pleural effusion, not elsewhere classified: Secondary | ICD-10-CM | POA: Diagnosis not present

## 2021-01-20 DIAGNOSIS — R32 Unspecified urinary incontinence: Secondary | ICD-10-CM | POA: Diagnosis not present

## 2021-01-20 DIAGNOSIS — Z8261 Family history of arthritis: Secondary | ICD-10-CM | POA: Diagnosis not present

## 2021-01-20 DIAGNOSIS — Z96642 Presence of left artificial hip joint: Secondary | ICD-10-CM | POA: Diagnosis present

## 2021-01-20 DIAGNOSIS — L0291 Cutaneous abscess, unspecified: Secondary | ICD-10-CM

## 2021-01-20 DIAGNOSIS — A4151 Sepsis due to Escherichia coli [E. coli]: Principal | ICD-10-CM | POA: Diagnosis present

## 2021-01-20 DIAGNOSIS — R7881 Bacteremia: Secondary | ICD-10-CM | POA: Diagnosis not present

## 2021-01-20 DIAGNOSIS — Z8249 Family history of ischemic heart disease and other diseases of the circulatory system: Secondary | ICD-10-CM | POA: Diagnosis not present

## 2021-01-20 DIAGNOSIS — T8463XA Infection and inflammatory reaction due to internal fixation device of spine, initial encounter: Secondary | ICD-10-CM | POA: Diagnosis present

## 2021-01-20 DIAGNOSIS — Z86010 Personal history of colonic polyps: Secondary | ICD-10-CM

## 2021-01-20 DIAGNOSIS — J869 Pyothorax without fistula: Secondary | ICD-10-CM | POA: Diagnosis present

## 2021-01-20 DIAGNOSIS — T361X5A Adverse effect of cephalosporins and other beta-lactam antibiotics, initial encounter: Secondary | ICD-10-CM | POA: Diagnosis not present

## 2021-01-20 DIAGNOSIS — M21372 Foot drop, left foot: Secondary | ICD-10-CM | POA: Diagnosis present

## 2021-01-20 DIAGNOSIS — A419 Sepsis, unspecified organism: Secondary | ICD-10-CM | POA: Diagnosis present

## 2021-01-20 DIAGNOSIS — Z6841 Body Mass Index (BMI) 40.0 and over, adult: Secondary | ICD-10-CM | POA: Diagnosis not present

## 2021-01-20 DIAGNOSIS — M25461 Effusion, right knee: Secondary | ICD-10-CM | POA: Diagnosis present

## 2021-01-20 DIAGNOSIS — K6819 Other retroperitoneal abscess: Secondary | ICD-10-CM | POA: Diagnosis present

## 2021-01-20 DIAGNOSIS — R188 Other ascites: Secondary | ICD-10-CM | POA: Diagnosis not present

## 2021-01-20 DIAGNOSIS — R159 Full incontinence of feces: Secondary | ICD-10-CM | POA: Diagnosis not present

## 2021-01-20 DIAGNOSIS — T847XXA Infection and inflammatory reaction due to other internal orthopedic prosthetic devices, implants and grafts, initial encounter: Secondary | ICD-10-CM | POA: Diagnosis present

## 2021-01-20 DIAGNOSIS — T847XXD Infection and inflammatory reaction due to other internal orthopedic prosthetic devices, implants and grafts, subsequent encounter: Secondary | ICD-10-CM | POA: Diagnosis not present

## 2021-01-20 DIAGNOSIS — M11262 Other chondrocalcinosis, left knee: Secondary | ICD-10-CM | POA: Diagnosis present

## 2021-01-20 DIAGNOSIS — R5381 Other malaise: Secondary | ICD-10-CM | POA: Diagnosis present

## 2021-01-20 DIAGNOSIS — E8809 Other disorders of plasma-protein metabolism, not elsewhere classified: Secondary | ICD-10-CM | POA: Diagnosis present

## 2021-01-20 DIAGNOSIS — Z8719 Personal history of other diseases of the digestive system: Secondary | ICD-10-CM | POA: Diagnosis not present

## 2021-01-20 DIAGNOSIS — G4733 Obstructive sleep apnea (adult) (pediatric): Secondary | ICD-10-CM | POA: Diagnosis present

## 2021-01-20 DIAGNOSIS — K5901 Slow transit constipation: Secondary | ICD-10-CM | POA: Diagnosis not present

## 2021-01-20 DIAGNOSIS — T8463XD Infection and inflammatory reaction due to internal fixation device of spine, subsequent encounter: Secondary | ICD-10-CM | POA: Diagnosis not present

## 2021-01-20 DIAGNOSIS — M8600 Acute hematogenous osteomyelitis, unspecified site: Secondary | ICD-10-CM | POA: Diagnosis not present

## 2021-01-20 DIAGNOSIS — Y752 Prosthetic and other implants, materials and neurological devices associated with adverse incidents: Secondary | ICD-10-CM | POA: Diagnosis present

## 2021-01-20 DIAGNOSIS — Y831 Surgical operation with implant of artificial internal device as the cause of abnormal reaction of the patient, or of later complication, without mention of misadventure at the time of the procedure: Secondary | ICD-10-CM | POA: Diagnosis present

## 2021-01-20 DIAGNOSIS — W19XXXA Unspecified fall, initial encounter: Secondary | ICD-10-CM | POA: Diagnosis not present

## 2021-01-20 DIAGNOSIS — Z800 Family history of malignant neoplasm of digestive organs: Secondary | ICD-10-CM | POA: Diagnosis not present

## 2021-01-20 DIAGNOSIS — M11261 Other chondrocalcinosis, right knee: Secondary | ICD-10-CM

## 2021-01-20 DIAGNOSIS — J189 Pneumonia, unspecified organism: Secondary | ICD-10-CM | POA: Diagnosis not present

## 2021-01-20 LAB — COMPREHENSIVE METABOLIC PANEL
ALT: 13 U/L (ref 0–44)
AST: 37 U/L (ref 15–41)
Albumin: 1.7 g/dL — ABNORMAL LOW (ref 3.5–5.0)
Alkaline Phosphatase: 92 U/L (ref 38–126)
Anion gap: 7 (ref 5–15)
BUN: 20 mg/dL (ref 8–23)
CO2: 28 mmol/L (ref 22–32)
Calcium: 7.9 mg/dL — ABNORMAL LOW (ref 8.9–10.3)
Chloride: 99 mmol/L (ref 98–111)
Creatinine, Ser: 0.81 mg/dL (ref 0.61–1.24)
GFR, Estimated: >60 mL/min (ref >60)
Glucose, Bld: 162 mg/dL — ABNORMAL HIGH (ref 70–99)
Potassium: 4.2 mmol/L (ref 3.5–5.1)
Sodium: 134 mmol/L — ABNORMAL LOW (ref 135–145)
Total Bilirubin: 0.5 mg/dL (ref 0.3–1.2)
Total Protein: 5.1 g/dL — ABNORMAL LOW (ref 6.5–8.1)

## 2021-01-20 LAB — PROCALCITONIN: Procalcitonin: 1.01 ng/mL

## 2021-01-20 LAB — CBC WITH DIFFERENTIAL/PLATELET
Abs Immature Granulocytes: 0.39 10*3/uL — ABNORMAL HIGH (ref 0.00–0.07)
Basophils Absolute: 0.1 10*3/uL (ref 0.0–0.1)
Basophils Relative: 0 %
Eosinophils Absolute: 0.2 10*3/uL (ref 0.0–0.5)
Eosinophils Relative: 1 %
HCT: 28.4 % — ABNORMAL LOW (ref 39.0–52.0)
Hemoglobin: 8.9 g/dL — ABNORMAL LOW (ref 13.0–17.0)
Immature Granulocytes: 1 %
Lymphocytes Relative: 2 %
Lymphs Abs: 0.5 10*3/uL — ABNORMAL LOW (ref 0.7–4.0)
MCH: 27.7 pg (ref 26.0–34.0)
MCHC: 31.3 g/dL (ref 30.0–36.0)
MCV: 88.5 fL (ref 80.0–100.0)
Monocytes Absolute: 1.6 10*3/uL — ABNORMAL HIGH (ref 0.1–1.0)
Monocytes Relative: 5 %
Neutro Abs: 26.2 10*3/uL — ABNORMAL HIGH (ref 1.7–7.7)
Neutrophils Relative %: 91 %
Platelets: 594 10*3/uL — ABNORMAL HIGH (ref 150–400)
RBC: 3.21 MIL/uL — ABNORMAL LOW (ref 4.22–5.81)
RDW: 14.6 % (ref 11.5–15.5)
WBC: 29 10*3/uL — ABNORMAL HIGH (ref 4.0–10.5)
nRBC: 0 % (ref 0.0–0.2)

## 2021-01-20 LAB — LACTIC ACID, PLASMA: Lactic Acid, Venous: 1.3 mmol/L (ref 0.5–1.9)

## 2021-01-20 LAB — BODY FLUID CELL COUNT WITH DIFFERENTIAL
Eos, Fluid: 1 %
Lymphs, Fluid: 2 %
Monocyte-Macrophage-Serous Fluid: 5 % — ABNORMAL LOW (ref 50–90)
Neutrophil Count, Fluid: 92 % — ABNORMAL HIGH (ref 0–25)
Total Nucleated Cell Count, Fluid: 20900 cu mm — ABNORMAL HIGH (ref 0–1000)

## 2021-01-20 LAB — PROTEIN, PLEURAL OR PERITONEAL FLUID
Total protein, fluid: 3.3 g/dL
Total protein, fluid: <3 g/dL

## 2021-01-20 LAB — C-REACTIVE PROTEIN: CRP: 35.8 mg/dL — ABNORMAL HIGH (ref <1.0)

## 2021-01-20 LAB — LACTATE DEHYDROGENASE, PLEURAL OR PERITONEAL FLUID
LD, Fluid: 762 U/L — ABNORMAL HIGH (ref 3–23)
LD, Fluid: 711 U/L — ABNORMAL HIGH (ref 3–23)

## 2021-01-20 LAB — MRSA NEXT GEN BY PCR, NASAL: MRSA by PCR Next Gen: NOT DETECTED

## 2021-01-20 LAB — VANCOMYCIN, RANDOM: Vancomycin Rm: 12

## 2021-01-20 LAB — SEDIMENTATION RATE: Sed Rate: 70 mm/hr — ABNORMAL HIGH (ref 0–16)

## 2021-01-20 IMAGING — DX DG CHEST 1V
1 series · 1 of 1 positions shown · non-contrast
Comparison: Single-view of the chest [DATE].

CLINICAL DATA: Status post bilateral chest tube placement after
spread of spinal infection to the chest.

EXAM:
CHEST  1 VIEW

[chest ap]
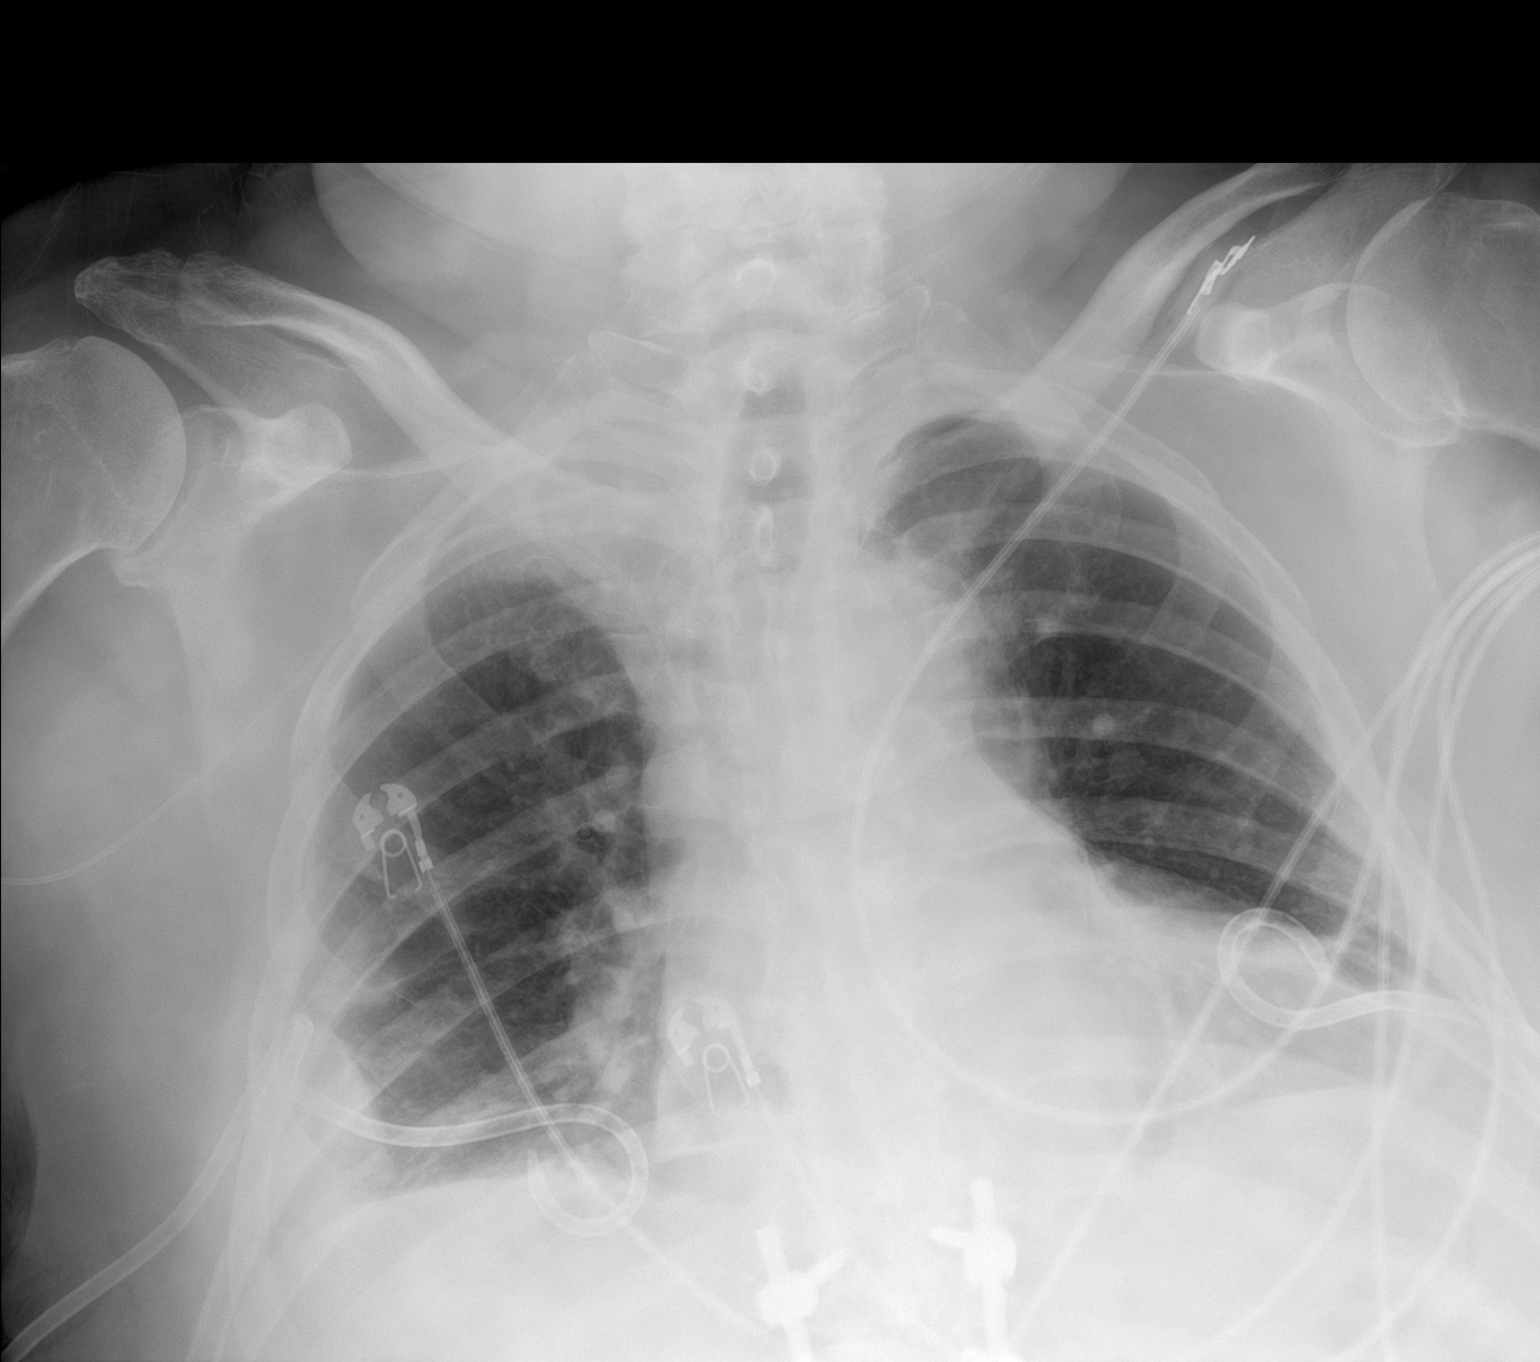

[1 of 1 positions shown; findings below may reference images not displayed]

FINDINGS: Pigtail catheters are in place bilaterally. The patient also has a
right PICC with its tip in the upper to mid superior vena cava. No
pneumothorax. Mild basilar opacities and small bilateral pleural
effusions noted. Heart size is normal.
IMPRESSION: Bilateral chest tubes in place.  No pneumothorax.

Small bilateral pleural effusions. Mild basilar airspace disease is
likely atelectasis.

Tip of right PICC projects in the upper to mid superior vena cava.

## 2021-01-20 MED ORDER — SODIUM CHLORIDE 0.9 % IV SOLN
2.0000 g | Freq: Once | INTRAVENOUS | Status: AC
  Administered 2021-01-20: 2 g via INTRAVENOUS
  Filled 2021-01-20: qty 2

## 2021-01-20 MED ORDER — DOCUSATE SODIUM 100 MG PO CAPS
100.0000 mg | ORAL_CAPSULE | Freq: Two times a day (BID) | ORAL | Status: DC
Start: 2021-01-20 — End: 2021-01-31
  Administered 2021-01-20 – 2021-01-31 (×22): 100 mg via ORAL
  Filled 2021-01-20 (×23): qty 1

## 2021-01-20 MED ORDER — VANCOMYCIN HCL 1250 MG/250ML IV SOLN
1250.0000 mg | Freq: Two times a day (BID) | INTRAVENOUS | Status: DC
  Administered 2021-01-21: 1250 mg via INTRAVENOUS
  Filled 2021-01-20: qty 250

## 2021-01-20 MED ORDER — ACETAMINOPHEN 650 MG RE SUPP: 650.0000 mg | Freq: Four times a day (QID) | RECTAL | Status: DC | PRN

## 2021-01-20 MED ORDER — ACETAMINOPHEN 325 MG PO TABS
650.0000 mg | ORAL_TABLET | Freq: Four times a day (QID) | ORAL | Status: DC | PRN
  Administered 2021-01-20 – 2021-02-03 (×17): 650 mg via ORAL
  Filled 2021-01-20 (×18): qty 2

## 2021-01-20 MED ORDER — SODIUM CHLORIDE 0.9% FLUSH
3.0000 mL | Freq: Two times a day (BID) | INTRAVENOUS | Status: DC
  Administered 2021-01-21 – 2021-02-03 (×19): 3 mL via INTRAVENOUS

## 2021-01-20 MED ORDER — OXYCODONE HCL 5 MG PO TABS
5.0000 mg | ORAL_TABLET | Freq: Four times a day (QID) | ORAL | Status: DC | PRN
  Administered 2021-01-20 – 2021-02-01 (×24): 5 mg via ORAL
  Filled 2021-01-20 (×27): qty 1

## 2021-01-20 MED ORDER — POLYETHYLENE GLYCOL 3350 17 G PO PACK
17.0000 g | PACK | Freq: Two times a day (BID) | ORAL | Status: DC
  Administered 2021-01-20 – 2021-01-21 (×3): 17 g via ORAL
  Filled 2021-01-20 (×3): qty 1

## 2021-01-20 MED ORDER — ONDANSETRON HCL 4 MG/2ML IJ SOLN
4.0000 mg | Freq: Four times a day (QID) | INTRAMUSCULAR | Status: DC | PRN
  Administered 2021-01-28: 4 mg via INTRAVENOUS
  Filled 2021-01-20: qty 2

## 2021-01-20 MED ORDER — BISACODYL 5 MG PO TBEC: 5.0000 mg | DELAYED_RELEASE_TABLET | Freq: Every day | ORAL | Status: DC | PRN

## 2021-01-20 MED ORDER — VANCOMYCIN HCL 1750 MG/350ML IV SOLN
1750.0000 mg | INTRAVENOUS | Status: AC
  Administered 2021-01-20: 1750 mg via INTRAVENOUS
  Filled 2021-01-20: qty 350

## 2021-01-20 MED ORDER — MORPHINE SULFATE (PF) 4 MG/ML IV SOLN
INTRAVENOUS | Status: AC
  Filled 2021-01-20: qty 1

## 2021-01-20 MED ORDER — MORPHINE SULFATE (PF) 4 MG/ML IV SOLN
4.0000 mg | INTRAVENOUS | Status: DC | PRN
  Administered 2021-01-20 – 2021-01-28 (×5): 4 mg via INTRAVENOUS
  Filled 2021-01-20 (×4): qty 1

## 2021-01-20 MED ORDER — ONDANSETRON HCL 4 MG PO TABS
4.0000 mg | ORAL_TABLET | Freq: Four times a day (QID) | ORAL | Status: DC | PRN
  Administered 2021-01-25: 4 mg via ORAL
  Filled 2021-01-20: qty 1

## 2021-01-20 MED ORDER — SODIUM CHLORIDE 0.9 % IV SOLN
2.0000 g | Freq: Three times a day (TID) | INTRAVENOUS | Status: DC
  Administered 2021-01-21 – 2021-01-24 (×11): 2 g via INTRAVENOUS
  Filled 2021-01-20 (×11): qty 2

## 2021-01-20 MED ORDER — MORPHINE SULFATE (PF) 2 MG/ML IV SOLN
2.0000 mg | INTRAVENOUS | Status: DC | PRN
  Filled 2021-01-20: qty 1

## 2021-01-20 MED ORDER — VANCOMYCIN HCL IN DEXTROSE 1-5 GM/200ML-% IV SOLN: 1000.0000 mg | Freq: Once | INTRAVENOUS | Status: DC

## 2021-01-20 NOTE — Progress Notes (Signed)
Assisted Dr Katrinka Blazing and Joneen Roach with CT placement. Bilat CT placed to water seal

## 2021-01-20 NOTE — Procedures (Signed)
Insertion of Chest Tube Procedure Note  Marco Kennedy  [id number]  06-Jun-1951  Date:01/20/21  Time:6:32 PM    Provider Performing: Duayne Cal   Procedure: Chest Tube Insertion [phone])  Indication(s) Effusion  Consent Risks of the procedure as well as the alternatives and risks of each were explained to the patient and/or caregiver.  Consent for the procedure was obtained and is signed in the bedside chart  Anesthesia Topical only with 1% lidocaine    Time Out Verified patient identification, verified procedure, site/side was marked, verified correct patient position, special equipment/implants available, medications/allergies/relevant history reviewed, required imaging and test results available.   Sterile Technique Maximal sterile technique including full sterile barrier drape, hand hygiene, sterile gown, sterile gloves, mask, hair covering, sterile ultrasound probe cover (if used).   Procedure Description Ultrasound used to identify appropriate pleural anatomy for placement and overlying skin marked. Area of placement cleaned and draped in sterile fashion.  A 14 French pigtail pleural catheter was placed into the left pleural space using Seldinger technique. Appropriate return of fluid was obtained.  The tube was connected to atrium and placed on -20 cm H2O wall suction.   Complications/Tolerance None; patient tolerated the procedure well. Chest X-ray is ordered to verify placement.   EBL Minimal  Specimen(s) 40cc clear tea-colored fluid   Joneen Roach, AGACNP-BC [facility]  See Amion for personal pager PCCM on call pager [phone] until 7pm. Please call Elink 7p-7a. [phone]  01/20/2021 6:34 PM

## 2021-01-20 NOTE — Procedures (Signed)
Insertion of Chest Tube Procedure Note  KEANTHONY POOLE  [id number]  1950-11-14  Date:01/20/21  Time:6:52 PM    Provider Performing: Lorin Glass   Procedure: Pleural Catheter Insertion w/ Imaging Guidance ([id number])  Indication(s) Effusion  Consent Risks of the procedure as well as the alternatives and risks of each were explained to the patient and/or caregiver.  Consent for the procedure was obtained and is signed in the bedside chart  Anesthesia Topical only with 1% lidocaine    Time Out Verified patient identification, verified procedure, site/side was marked, verified correct patient position, special equipment/implants available, medications/allergies/relevant history reviewed, required imaging and test results available.   Sterile Technique Maximal sterile technique including full sterile barrier drape, hand hygiene, sterile gown, sterile gloves, mask, hair covering, sterile ultrasound probe cover (if used).   Procedure Description Ultrasound used to identify appropriate pleural anatomy for placement and overlying skin marked. Area of placement cleaned and draped in sterile fashion.  A 14 French pigtail pleural catheter was placed into the right pleural space using Seldinger technique. Appropriate return of fluid was obtained.  The tube was connected to atrium and placed on -20 cm H2O wall suction.   Complications/Tolerance None; patient tolerated the procedure well. Chest X-ray is ordered to verify placement.   EBL Minimal  Specimen(s) fluid

## 2021-01-20 NOTE — Progress Notes (Addendum)
Pharmacy Antibiotic Note  Marco Kennedy is a 70 y.o. 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 Pharmacy has been consulted for vancomycin and cefepime dosing. Pt had recent admission from 7/16-7/22 for sepsis secondary to abscess near pelvic hardware. Pt grew E coli from wound cx and several surgical cx and was discharged on cefazolin with plan for 6 wks of IV OPAT. Pt presented to [facility] on 7/25 evening with sepsis. Per ED RN there, he rec'd the following antibiotics: Zosyn 3.375 gm IV X 2 (last at 12:38 PM today), vancomycin 2500 mg IV X 1 at 2337 last night, and IV cefazolin that his wife was administering to him from OPAT supply while in the ED.   WBC pending, Tmax 100 F; Scr 0.81 (was 0.80 last night at OSH; renal function also stable from recent admission here); CrCl 106.2 ml/min; total body wt (01/13/21): 122.2 kg, ht 67 inches  Vancomycin random level this evening (~21 hrs after vancomycin 2500 mg dose at OSH) was 12 mg/L  Plan: Cefepime 2 gm IV Q 8 hrs Vancomycin 1750 mg IV X 1, followed by vancomycin 1250 mg IV Q 12 hrs (using vancomycin trough levels to guide therapy, given CNS involvement; goal vancomycin trough level: 15-20 mg/L) Monitor WBC, temp, clinical improvement, cultures, renal function, vancomycin levels  Temp (24hrs), Avg:100 F (37.8 C), Min:100 F (37.8 C), Max:100 F (37.8 C)  Recent Labs  Lab 01/14/21 0256 01/15/21 0600 01/20/21 2039 01/20/21 2050  WBC 18.6* 20.6*  --   --   CREATININE 1.12 1.15 0.81  --   LATICACIDVEN  --   --   --  1.3  VANCORANDOM  --   --  12  --     Estimated Creatinine Clearance: 74.8 mL/min (by C-G formula based on SCr of 1.15 mg/dL).    No Known Allergies  Antimicrobials this admission: Cefepime 7/26 >> Vancomycin 7/26 >>  Microbiology results: 7//17 Wound/abscess on back: pan-sensitive E coli 7/19 Surgical cx (from I&D): E  coli  Thank you for allowing pharmacy to be a part of this patient's care.  Vicki Mallet, PharmD, BCPS, [facility] Clinical Pharmacist 01/20/2021 6:42 PM

## 2021-01-20 NOTE — H&P (Addendum)
History and Physical    Marco Kennedy HDQ:[id number] DOB: 26-Nov-1950 DOA: 01/20/2021  PCP: Yvone Neu, MD Consultants:  Vertell Limber - neurosurgery; Alvan Dame - orthopedics Patient coming from:  Home - lives with wife; NOK: Wife, [phone]  Chief Complaint: Recurrent infection  HPI: Marco Kennedy is a 70 y.o. male with medical history significant of back surgery with pelvis fixation in 03/2020; OSA on BIPAP; and recent admission from 7/16-22 for sepsis secondary to moderate-sized abscess near the pelvic hardware.  Neurosurgery, IR, and ID were involved in his care and he underwent wound debridement with neurosurgery on 7/19 with E. Coli from wound cultures.  He was given Cefazolin with plan for 6 weeks of IV treatment via PICC line.   His wife has been giving Cefazolin at 12-28-09 daily.  They got home on 7/22 and the next day he complained of B rib pain, sleeping in lift chair.  They weighed 7/23 and his weight was up 10 pounds since pre-hospitalization.  On 7/24, his feet and legs were swelling and he felt bloated.  HHRN came in 7/25 to assess the PICC line; she recommended them contacting his PCP -> surgeon -> PCP.  7/25 they decided to try telehealth - he was coughing and he was noting chest pain.  They went back to the ER in [place].  His overall weight gain is closer to 18 pounds now.  +early satiety, decreased PO intake.  He does take Diclofenac BID for knee pain.  Fevers and chills are ongoing, starting the day of discharge (chills).  He had fever Sunday and then again yesterday.  His lower back is too uncomfortable for him to lie flat at home.  Occasional cough, mild SOB periodically.    ED Course:  OSH to [facility] transfer, per Dr. Marlowe Sax:  He now presents to the ED at [facility] with sepsis.  He is tachycardic and tachypneic.  Hypoxic to 88% on room air, improved with 4 L supplemental oxygen.  CT showing empyema and also discitis/osteomyelitis of T10.  WBC 19, lactate normal.   Patient was given Ancef and vancomycin.  Also has anasarca with normal kidney function and normal BNP.  Neurosurgery consulted by ED physician and agreed to see the patient in consultation.  Will also need ID consult.   Review of Systems: As per HPI; otherwise review of systems reviewed and negative.   Ambulatory Status:  Ambulates with a cane outside, a walker for unsteady ground  COVID Vaccine Status:   Complete plus booster  Past Medical History:  Diagnosis Date   Arthritis    arthritis-bilateral knees, left Hip.   Degenerative lumbar spinal stenosis    EKG abnormality    06-08-16- being evaluated by cardiologist  in [place]   Idiopathic scoliosis of lumbar region    Joint stiffness    Knee pain    Leg swelling    Low back pain    Achy, shooting pain.  Hurts to Walk or stand up straight.   Lumbar radiculopathy    Scoliosis concern    Sleep apnea    Bipap use nightly 25/19 -3l/m oxygen   Spondylolisthesis of lumbar region    Transfusion history    [place] ,[place] after bleeding post colon polyp removal and colonscopy procedure    Past Surgical History:  Procedure Laterality Date   ABDOMINAL EXPOSURE N/A 04/18/2020   Procedure: ABDOMINAL EXPOSURE;  Surgeon: Marty Heck, MD;  Location: [place];  Service: Vascular;  Laterality: N/A;  ANTERIOR LAT LUMBAR FUSION Left 04/18/2020   Procedure: ANTERIOR LATERAL LUMBAR FUSION LUMBAR TWO-THREE, LUMBAR THREE-FOUR;  Surgeon: Erline Levine, MD;  Location: [place];  Service: Neurosurgery;  Laterality: Left;   ANTERIOR LUMBAR FUSION N/A 04/18/2020   Procedure: Lumbar Four-Five Lumbar Five- Sacral One Anterior lumbar Interbody Fusion;  Surgeon: Erline Levine, MD;  Location: [place];  Service: Neurosurgery;  Laterality: N/A;   APPLICATION OF INTRAOPERATIVE CT SCAN N/A 04/21/2020   Procedure: APPLICATION OF INTRAOPERATIVE CT SCAN;  Surgeon: Erline Levine, MD;  Location: [place];  Service: Neurosurgery;  Laterality: N/A;   BACK SURGERY      lumbar disc surgery   COLONOSCOPY W/ POLYPECTOMY     HERNIA REPAIR Left    left inguinal hernia repair   KNEE ARTHROSCOPY Right 2003    -scope for meniscus   LUMBAR WOUND DEBRIDEMENT N/A 01/13/2021   Procedure: THORACOLUMBAR WOUND DEBRIDEMENT;  Surgeon: Erline Levine, MD;  Location: [place];  Service: Neurosurgery;  Laterality: N/A;   POSTERIOR LUMBAR FUSION 4 LEVEL N/A 04/21/2020   Procedure: Thoracic ten to pelvis pedicle screw fixation with osteotomies;  Surgeon: Erline Levine, MD;  Location: [place];  Service: Neurosurgery;  Laterality: N/A;   TOTAL HIP ARTHROPLASTY Left 06/15/2016   Procedure: LEFT TOTAL HIP ARTHROPLASTY ANTERIOR APPROACH;  Surgeon: Paralee Cancel, MD;  [facility];  Service: Orthopedics;  Laterality: Left;    Social History   Socioeconomic History   Marital status: Married    Spouse name: Not on file   Number of children: 2   Years of education: Not on file   Highest education level: Not on file  Occupational History   Not on file  Tobacco Use   Smoking status: Never   Smokeless tobacco: Never  Vaping Use   Vaping Use: Never used  Substance and Sexual Activity   Alcohol use: Yes    Comment: rare- social    Drug use: No   Sexual activity: Yes  Other Topics Concern   Not on file  Social History Narrative   Not on file   Social Determinants of Health   Financial Resource Strain: Not on file  Food Insecurity: Not on file  Transportation Needs: Not on file  Physical Activity: Not on file  Stress: Not on file  Social Connections: Not on file  Intimate Partner Violence: Not on file    No Known Allergies  Family History  Problem Relation Age of Onset   Hypertension Mother    Arthritis Mother    Stomach cancer Father    Hypertension Brother     Prior to Admission medications   Medication Sig Start Date End Date Taking? Authorizing Provider  ceFAZolin (ANCEF) IVPB Inject 2 g into the vein every 8 (eight) hours. Indication:  vertebral  osteomyelitis First Dose: No Last Day of Therapy:  02/24/2021 Labs - Once weekly:  CBC/D and BMP, Labs - Every other week:  ESR and CRP Method of administration: IV Push Method of administration may be changed at the discretion of home infusion pharmacist based upon assessment of the patient and/or caregiver's ability to self-administer the medication ordered. 01/15/21 02/24/21  Marvis Moeller, NP  cholecalciferol (VITAMIN D3) 25 MCG (1000 UNIT) tablet Take 1,000 Units by mouth daily.    [provider]  diclofenac (VOLTAREN) 75 MG EC tablet Take 75 mg by mouth 2 (two) times daily. 11/13/20   [provider]  Multiple Vitamin (MULTIVITAMIN WITH MINERALS) TABS tablet Take 1 tablet by mouth  daily.    [provider]  oxyCODONE (OXY IR/ROXICODONE) 5 MG immediate release tablet Take 1 tablet (5 mg total) by mouth every 6 (six) hours as needed for moderate pain. 01/15/21   Little Ishikawa, MD  polyethylene glycol (MIRALAX / GLYCOLAX) 17 g packet Take 17 g by mouth 2 (two) times daily. 01/15/21   Little Ishikawa, MD    Physical Exam: Vitals:   01/20/21 1806 01/20/21 1818 01/20/21 1833 01/20/21 1847  BP: 139/88 (!) 146/96 (!) 160/61 (!) 161/77  Pulse: (!) 107 (!) 110 (!) 111 (!) 112  SpO2: 94% 97% 93% 96%     General:  Appears ill but in NAD Eyes:  EOMI, normal lids, iris ENT:  grossly normal hearing, lips & tongue, mildly dry mm Neck:  no LAD, masses or thyromegaly Cardiovascular:  RR with mild tachycardia, no m/r/g. 2-3+ LE edema, compression bandages in place.  Respiratory:   Diminished breath sounds bilaterally with generally decreased air movement.  Mildly increased respiratory effort. Abdomen:  soft, NT, ND, abdominal muscle discomfort with rolling Back:   bandage is in place without obvious drainage or surrounding erythema Skin:  no rash or induration seen on limited exam Musculoskeletal:  grossly normal tone BUE/BLE, good ROM, no bony  abnormality Psychiatric:  blunted mood and affect, speech fluent and appropriate, AOx3 Neurologic:  CN 2-12 grossly intact, moves all extremities in coordinated fashion    Radiological Exams on Admission: Independently reviewed - see discussion in A/P where applicable  No results found.  CT A/P -  1- Osteo/discitis of T10 (T9-11 disc spaces; suspected source is R pedicle screw at T10 2- Progressive B empyemas; some mottled gas consistent with infection into each of these collections from the T10 vertebral body region 3- Probable L psoas muscle seroma  CXR - suspected mild to moderate R pleural effusion with tracking cephalad posteriorly and laterally; mild L pleural effusion    Labs on Admission: I have personally reviewed the available labs and imaging studies at the time of the admission.  Pertinent labs:   At OSH: WBC 19.5 Platelets 594 Glucose 232 Albumin 2 Lactate 1.0 COVID negative   Assessment/Plan Principal Problem:   Sepsis due to undetermined organism [facility]) Active Problems:   Vertebral osteomyelitis (HCC)   Hardware complicating wound infection (HCC)   Pleural effusion   Anasarca   OSA (obstructive sleep apnea)   Obesity, Class III, BMI 40-49.9 (morbid obesity) ([place])   Sepsis from back infection with osteo/discitis.   -SIRS criteria in this patient includes: Leukocytosis, tachycardia, tachypnea, hypoxia  -Patient has no current evidence of acute organ failure other than O2-dependent respiratory failure -While awaiting blood cultures, this appears to be a preseptic condition. -Sepsis protocol initiated -Suspected source is discitis/osteo with possible infected hardware -Blood and urine cultures pending -Will admit due to: hypoxemia; recurrent need for hospitalization -Treat with IV Cefepime/Vanc  -Will check lactate  -Will order procalcitonin level.   -Neurosurgery vs. IR - will start with Dr. Vertell Limber (Dr. Kathyrn Sheriff is covering and says there is likely no  indication for surgery at this time; he was asked to consult tonight or tomorrow) -ID consult with dr. Tommy Medal, continue PICC for now pending blood cultures and he will see in AM -Blood cultures do not appear to have been drawn at OSH, will order now -Leave PICC if blood cultures negative, otherwise remove PICC  Empyema -Pulm consult for chest tube placement -Continue abx -He is now s/p B pigtail catheter placement  Anasarca -Negative BNP, no edema on CXR -Appears to be due to hypoalbuminemia (2) -Will place nutrition consult  OSA -Continue BIPAP qhs  Constipation -Continue Miralax BID -Add prn medications and standing BID Colace  Obesity -BMI 42.2 -Weight loss should be encouraged -Outpatient PCP/bariatric medicine/bariatric surgery f/u encouraged     Note: This patient has been tested and is negative for the novel coronavirus COVID-19. He has been fully vaccinated against COVID-19.   Level of care: Progressive  DVT prophylaxis:  Heparin Code Status:  Full - confirmed with patient/family Family Communication: Wife present throughout evaluation  Disposition Plan:  The patient is from: home  Anticipated d/c is to: home [facility] services   Anticipated d/c date will depend on clinical response to treatment, likely several days  Patient is currently: acutely ill Consults called: Neurosurgery; ID; Pulmonology; Nutrition Admission status: Admit - It is my clinical opinion that admission to INPATIENT is reasonable and necessary because of the expectation that this patient will require hospital care that crosses at least 2 midnights to treat this condition based on the medical complexity of the problems presented.  Given the aforementioned information, the predictability of an adverse outcome is felt to be significant.    Karmen Bongo MD [facility]   How to contact the [facility] Attending or Consulting provider [place] or covering provider during after hours [place], for  this patient?  Check the care team in [facility] and look for a) attending/consulting TRH provider listed and b) the [facility] team listed Log into [email or web address] and use [place]'s universal password to access. If you do not have the password, please contact the hospital operator. Locate the [facility] provider you are looking for under [facility] and page to a number that you can be directly reached. If you still have difficulty reaching the provider, please page the [facility] (Director on Call) for the Hospitalists listed on amion for assistance.   01/20/2021, 7:12 PM

## 2021-01-20 NOTE — Consult Note (Signed)
NAME:  Marco Kennedy, MRN:  [id number], DOB:  [birth year], LOS: 0 ADMISSION DATE:  01/20/2021, CONSULTATION DATE:  01/20/21 REFERRING MD:  Lorin Mercy, CHIEF COMPLAINT:  SOB   History of Present Illness:  70 year old man with hx of OSA on CPAP, recent lumbar hardware infection, epidural abscess who was Dc'd on prolonged IV antibiotics on 7/16 presenting with progressive SOB, chest pain, fevers, chills, and shoulder discomfort.  Imaging at [facility] ED revealed continued infection in thoracolumbar areas as well as new likely spread to pleura.  PCCM consulted to help manage pleural infection.  Pertinent  Medical History  OSA Pelvic hardware infection detailed in DC summary 7/22  Significant Hospital Events: Including procedures, antibiotic start and stop dates in addition to other pertinent events   7/26 admitted from [place] ED  Interim History / Subjective:  consulted  Objective   Saturating 95% on 4L [place]  Examination: General: no acute distress HENT: MMM, malampatti 4 Lungs: Diminished bases with bilateral simple dependent moderate sized pleural effusions Cardiovascular: RRR, ext warm Abdomen: soft, +BS Extremities: trace edema Neuro: moves all 4 ext to command Skin: some ?yeast, rashes around skin folds  Resolved Hospital Problem list   NOS  Assessment & Plan:  Acute hypoxemic respiratory failure secondary to pleural spread of known extensive thoracolumbar-pelvic area. - Bilateral pigtails, send for usual studies - f/u admission labs - patient had good bit of pleurisy with chest tubes on suction, fine to leave to waterseal tonight - Probably warrants another ID consult - Will follow with you  Best Practice (right click and "Reselect all SmartList Selections" daily)  Per primary  Labs   CBC: Recent Labs  Lab 01/14/21 0256 01/15/21 0600  WBC 18.6* 20.6*  HGB 10.8* 10.8*  HCT 33.2* 33.3*  MCV 86.2 87.4  PLT 250 527    Basic Metabolic Panel: Recent Labs  Lab  01/14/21 0256 01/15/21 0600  NA 136 135  K 3.6 3.6  CL 100 102  CO2 27 26  GLUCOSE 146* 154*  BUN 26* 32*  CREATININE 1.12 1.15  CALCIUM 7.7* 7.6*   GFR: Estimated Creatinine Clearance: 74.8 mL/min (by C-G formula based on SCr of 1.15 mg/dL). Recent Labs  Lab 01/14/21 0256 01/15/21 0600  WBC 18.6* 20.6*    Liver Function Tests: Recent Labs  Lab 01/14/21 0256 01/15/21 0600  AST 137* 66*  ALT 98* 70*  ALKPHOS 87 81  BILITOT 1.4* 1.3*  PROT 4.9* 4.8*  ALBUMIN 1.9* 1.9*   No results for input(s): LIPASE, AMYLASE in the last 168 hours. No results for input(s): AMMONIA in the last 168 hours.  ABG No results found for: PHART, PCO2ART, PO2ART, HCO3, TCO2, ACIDBASEDEF, O2SAT   Coagulation Profile: No results for input(s): INR, PROTIME in the last 168 hours.  Cardiac Enzymes: No results for input(s): CKTOTAL, CKMB, CKMBINDEX, TROPONINI in the last 168 hours.  HbA1C: Hgb A1c MFr Bld  Date/Time Value Ref Range Status  01/11/2021 05:00 AM 5.9 (H) 4.8 - 5.6 % Final    Comment:    (NOTE) Pre diabetes:          5.7%-6.4%  Diabetes:              >6.4%  Glycemic control for   <7.0% adults with diabetes     CBG: No results for input(s): GLUCAP in the last 168 hours.  Review of Systems:    Positive Symptoms in bold:  Constitutional fevers, chills, weight loss, fatigue, anorexia, malaise  Eyes  decreased vision, double vision, eye irritation  Ears, Nose, Mouth, Throat sore throat, trouble swallowing, sinus congestion  Cardiovascular chest pain, paroxysmal nocturnal dyspnea, lower ext edema, palpitations   Respiratory SOB, cough, DOE, hemoptysis, wheezing  Gastrointestinal nausea, vomiting, diarrhea  Genitourinary burning with urination, trouble urinating  Musculoskeletal joint aches, joint swelling, back pain  Integumentary  rashes, skin lesions  Neurological focal weakness, focal numbness, trouble speaking, headaches  Psychiatric depression, anxiety, confusion   Endocrine polyuria, polydipsia, cold intolerance, heat intolerance  Hematologic abnormal bruising, abnormal bleeding, unexplained nose bleeds  Allergic/Immunologic recurrent infections, hives, swollen lymph nodes     Past Medical History:  He,  has a past medical history of Arthritis, Degenerative lumbar spinal stenosis, EKG abnormality, Idiopathic scoliosis of lumbar region, Joint stiffness, Knee pain, Leg swelling, Low back pain, Lumbar radiculopathy, Scoliosis concern, Sleep apnea, Spondylolisthesis of lumbar region, and Transfusion history.   Surgical History:   Past Surgical History:  Procedure Laterality Date   ABDOMINAL EXPOSURE N/A 04/18/2020   Procedure: ABDOMINAL EXPOSURE;  Surgeon: Marty Heck, MD;  Location: [place];  Service: Vascular;  Laterality: N/A;   ANTERIOR LAT LUMBAR FUSION Left 04/18/2020   Procedure: ANTERIOR LATERAL LUMBAR FUSION LUMBAR TWO-THREE, LUMBAR THREE-FOUR;  Surgeon: Erline Levine, MD;  Location: [place];  Service: Neurosurgery;  Laterality: Left;   ANTERIOR LUMBAR FUSION N/A 04/18/2020   Procedure: Lumbar Four-Five Lumbar Five- Sacral One Anterior lumbar Interbody Fusion;  Surgeon: Erline Levine, MD;  Location: [place];  Service: Neurosurgery;  Laterality: N/A;   APPLICATION OF INTRAOPERATIVE CT SCAN N/A 04/21/2020   Procedure: APPLICATION OF INTRAOPERATIVE CT SCAN;  Surgeon: Erline Levine, MD;  Location: [place];  Service: Neurosurgery;  Laterality: N/A;   BACK SURGERY     lumbar disc surgery   COLONOSCOPY W/ POLYPECTOMY     HERNIA REPAIR Left    left inguinal hernia repair   KNEE ARTHROSCOPY Right 2003    -scope for meniscus   LUMBAR WOUND DEBRIDEMENT N/A 01/13/2021   Procedure: THORACOLUMBAR WOUND DEBRIDEMENT;  Surgeon: Erline Levine, MD;  Location: [place];  Service: Neurosurgery;  Laterality: N/A;   POSTERIOR LUMBAR FUSION 4 LEVEL N/A 04/21/2020   Procedure: Thoracic ten to pelvis pedicle screw fixation with osteotomies;  Surgeon: Erline Levine,  MD;  Location: [place];  Service: Neurosurgery;  Laterality: N/A;   TOTAL HIP ARTHROPLASTY Left 06/15/2016   Procedure: LEFT TOTAL HIP ARTHROPLASTY ANTERIOR APPROACH;  Surgeon: Paralee Cancel, MD;  [facility];  Service: Orthopedics;  Laterality: Left;     Social History:   reports that he has never smoked. He has never used smokeless tobacco. He reports current alcohol use. He reports that he does not use drugs.   Family History:  His family history includes Arthritis in his mother; Hypertension in his brother and mother; Stomach cancer in his father.   Allergies No Known Allergies   Home Medications  Prior to Admission medications   Medication Sig Start Date End Date Taking? Authorizing Provider  ceFAZolin (ANCEF) IVPB Inject 2 g into the vein every 8 (eight) hours. Indication:  vertebral osteomyelitis First Dose: No Last Day of Therapy:  02/24/2021 Labs - Once weekly:  CBC/D and BMP, Labs - Every other week:  ESR and CRP Method of administration: IV Push Method of administration may be changed at the discretion of home infusion pharmacist based upon assessment of the patient and/or caregiver's ability to self-administer the medication ordered. 01/15/21 02/24/21  Marvis Moeller, NP  cholecalciferol (VITAMIN  D3) 25 MCG (1000 UNIT) tablet Take 1,000 Units by mouth daily.    [provider]  diclofenac (VOLTAREN) 75 MG EC tablet Take 75 mg by mouth 2 (two) times daily. 11/13/20   [provider]  Multiple Vitamin (MULTIVITAMIN WITH MINERALS) TABS tablet Take 1 tablet by mouth daily.    [provider]  oxyCODONE (OXY IR/ROXICODONE) 5 MG immediate release tablet Take 1 tablet (5 mg total) by mouth every 6 (six) hours as needed for moderate pain. 01/15/21   Little Ishikawa, MD  polyethylene glycol (MIRALAX / GLYCOLAX) 17 g packet Take 17 g by mouth 2 (two) times daily. 01/15/21   Little Ishikawa, MD

## 2021-01-21 ENCOUNTER — Inpatient Hospital Stay (HOSPITAL_COMMUNITY): Payer: BC Managed Care – PPO

## 2021-01-21 DIAGNOSIS — I38 Endocarditis, valve unspecified: Secondary | ICD-10-CM

## 2021-01-21 DIAGNOSIS — R601 Generalized edema: Secondary | ICD-10-CM | POA: Diagnosis not present

## 2021-01-21 DIAGNOSIS — T847XXD Infection and inflammatory reaction due to other internal orthopedic prosthetic devices, implants and grafts, subsequent encounter: Secondary | ICD-10-CM

## 2021-01-21 DIAGNOSIS — Z9689 Presence of other specified functional implants: Secondary | ICD-10-CM | POA: Diagnosis not present

## 2021-01-21 DIAGNOSIS — J90 Pleural effusion, not elsewhere classified: Secondary | ICD-10-CM | POA: Diagnosis not present

## 2021-01-21 DIAGNOSIS — A419 Sepsis, unspecified organism: Secondary | ICD-10-CM | POA: Diagnosis not present

## 2021-01-21 DIAGNOSIS — M4620 Osteomyelitis of vertebra, site unspecified: Secondary | ICD-10-CM

## 2021-01-21 LAB — BASIC METABOLIC PANEL
Anion gap: 7 (ref 5–15)
BUN: 18 mg/dL (ref 8–23)
CO2: 29 mmol/L (ref 22–32)
Calcium: 7.6 mg/dL — ABNORMAL LOW (ref 8.9–10.3)
Chloride: 98 mmol/L (ref 98–111)
Creatinine, Ser: 0.86 mg/dL (ref 0.61–1.24)
GFR, Estimated: >60 mL/min (ref >60)
Glucose, Bld: 152 mg/dL — ABNORMAL HIGH (ref 70–99)
Potassium: 4.1 mmol/L (ref 3.5–5.1)
Sodium: 134 mmol/L — ABNORMAL LOW (ref 135–145)

## 2021-01-21 LAB — URINALYSIS, ROUTINE W REFLEX MICROSCOPIC
Bilirubin Urine: NEGATIVE
Glucose, UA: NEGATIVE mg/dL
Ketones, ur: NEGATIVE mg/dL
Leukocytes,Ua: NEGATIVE
Nitrite: NEGATIVE
Protein, ur: 30 mg/dL — AB
Specific Gravity, Urine: 1.024 (ref 1.005–1.030)
pH: 5 (ref 5.0–8.0)

## 2021-01-21 LAB — BODY FLUID CELL COUNT WITH DIFFERENTIAL
Eos, Fluid: 4 %
Lymphs, Fluid: 9 %
Monocyte-Macrophage-Serous Fluid: 5 % — ABNORMAL LOW (ref 50–90)
Neutrophil Count, Fluid: 82 % — ABNORMAL HIGH (ref 0–25)
Total Nucleated Cell Count, Fluid: 3089 cu mm — ABNORMAL HIGH (ref 0–1000)

## 2021-01-21 LAB — GRAM STAIN

## 2021-01-21 LAB — CBC
HCT: 30.3 % — ABNORMAL LOW (ref 39.0–52.0)
Hemoglobin: 9.7 g/dL — ABNORMAL LOW (ref 13.0–17.0)
MCH: 28.2 pg (ref 26.0–34.0)
MCHC: 32 g/dL (ref 30.0–36.0)
MCV: 88.1 fL (ref 80.0–100.0)
Platelets: 475 10*3/uL — ABNORMAL HIGH (ref 150–400)
RBC: 3.44 MIL/uL — ABNORMAL LOW (ref 4.22–5.81)
RDW: 14.6 % (ref 11.5–15.5)
WBC: 23.2 10*3/uL — ABNORMAL HIGH (ref 4.0–10.5)
nRBC: 0 % (ref 0.0–0.2)

## 2021-01-21 LAB — LACTIC ACID, PLASMA: Lactic Acid, Venous: 0.8 mmol/L (ref 0.5–1.9)

## 2021-01-21 LAB — ECHOCARDIOGRAM COMPLETE
Area-P 1/2: 4.44 cm2
S' Lateral: 2.8 cm

## 2021-01-21 MED ORDER — ADULT MULTIVITAMIN W/MINERALS CH
1.0000 | ORAL_TABLET | Freq: Every day | ORAL | Status: DC
  Administered 2021-01-22 – 2021-02-03 (×13): 1 via ORAL
  Filled 2021-01-21 (×14): qty 1

## 2021-01-21 MED ORDER — ENSURE ENLIVE PO LIQD
237.0000 mL | Freq: Three times a day (TID) | ORAL | Status: DC
  Administered 2021-01-21 – 2021-02-03 (×32): 237 mL via ORAL

## 2021-01-21 MED ORDER — ENOXAPARIN SODIUM 40 MG/0.4ML IJ SOSY
40.0000 mg | PREFILLED_SYRINGE | Freq: Every day | INTRAMUSCULAR | Status: DC
  Administered 2021-01-21 – 2021-01-26 (×6): 40 mg via SUBCUTANEOUS
  Filled 2021-01-21 (×6): qty 0.4

## 2021-01-21 MED ORDER — DEXTROMETHORPHAN POLISTIREX ER 30 MG/5ML PO SUER
30.0000 mg | Freq: Two times a day (BID) | ORAL | Status: AC | PRN
  Administered 2021-01-21: 30 mg via ORAL
  Filled 2021-01-21 (×4): qty 5

## 2021-01-21 MED ORDER — KETOROLAC TROMETHAMINE 30 MG/ML IJ SOLN
30.0000 mg | Freq: Four times a day (QID) | INTRAMUSCULAR | Status: AC | PRN
Start: 2021-01-21 — End: 2021-01-23
  Administered 2021-01-21 – 2021-01-23 (×5): 30 mg via INTRAVENOUS
  Filled 2021-01-21 (×5): qty 1

## 2021-01-21 MED ORDER — CHLORHEXIDINE GLUCONATE CLOTH 2 % EX PADS
6.0000 | MEDICATED_PAD | Freq: Every day | CUTANEOUS | Status: DC
  Administered 2021-01-21 – 2021-02-03 (×14): 6 via TOPICAL

## 2021-01-21 MED ORDER — ENSURE ENLIVE PO LIQD
237.0000 mL | Freq: Two times a day (BID) | ORAL | Status: DC
  Administered 2021-01-21 (×2): 237 mL via ORAL

## 2021-01-21 MED ORDER — VANCOMYCIN HCL IN DEXTROSE 1-5 GM/200ML-% IV SOLN
1000.0000 mg | Freq: Two times a day (BID) | INTRAVENOUS | Status: DC
  Administered 2021-01-21 – 2021-01-22 (×2): 1000 mg via INTRAVENOUS
  Filled 2021-01-21 (×3): qty 200

## 2021-01-21 MED ORDER — PERFLUTREN LIPID MICROSPHERE
1.0000 mL | INTRAVENOUS | Status: AC | PRN
  Administered 2021-01-21: 2 mL via INTRAVENOUS
  Filled 2021-01-21: qty 10

## 2021-01-21 NOTE — Progress Notes (Signed)
Initial Nutrition Assessment  DOCUMENTATION CODES:  Morbid obesity  INTERVENTION:  Increase Ensure Enlive BID to TID.  Add Magic cup TID with meals, each supplement provides 290 kcal and 9 grams of protein.  Add MVI with minerals daily.  Obtain updated weight.  NUTRITION DIAGNOSIS:  Increased nutrient needs related to acute illness (sepsis) as evidenced by estimated needs.  GOAL:  Patient will meet greater than or equal to 90% of their needs  MONITOR:  PO intake, Supplement acceptance, Labs, Weight trends, Skin, I & O's  REASON FOR ASSESSMENT:  Consult Assessment of nutrition requirement/status, Wound healing  ASSESSMENT:  70 yo male with a PMH of recent discharge from the hospital 01/10/2021 for sepsis secondary to moderate-sized abscess near the pelvic hardware during this time neurosurgery IR and ID were involved in his care he underwent wound debridement on 01/13/2021 status post back surgery insertion of hardware, E. coli grew from wound, was treated with IV cefazolin and the plan was to continue 6 weeks of IV antibiotics via PICC line.  Went home on 01/16/2021 started complaining of rib pain he has gained 10 pounds since hospital discharge.  Since discharge she has been taking diclofenac twice a day for knee pain.  He started having fevers, transfer from [facility] with sepsis hypoxic which improved with 4 L of oxygen.  Spoke to pt at bedside. Pt reports eating a bit less recently given that all he wants are colder food items. Pt prefers colder foods because his throat is hurting.  Pt needs updated weight for this admission. RD to order updated weight. RD to use 01/13/21 weight to estimate needs. Pt's weight has increased since previous admission, likely fluid, as pt's feet were swollen on admission. Fluid may be masking true loss.  Pt with no depletions on exam.  RD to continue Ensure Enlive, but recommend it TID and add Magic Cup TID, as well as add MVI with  minerals daily.  Medications: reviewed; colace BID, EE BID, miralax, cefepime TID via IV, Vancomycin BID via IV, morphine PRN (given once today), oxycodone PRN (given once today)  Labs: reviewed; Na 134 (L), Glucose 152 (H)  NUTRITION - FOCUSED PHYSICAL EXAM: Flowsheet Row Most Recent Value  Orbital Region No depletion  Upper Arm Region No depletion  Thoracic and Lumbar Region No depletion  Buccal Region No depletion  Temple Region No depletion  Clavicle Bone Region No depletion  Clavicle and Acromion Bone Region No depletion  Scapular Bone Region No depletion  Dorsal Hand No depletion  Patellar Region No depletion  Anterior Thigh Region No depletion  Posterior Calf Region No depletion  Edema (RD Assessment) Severe  [BLE]  Hair Reviewed  Eyes Reviewed  Mouth Reviewed  Skin Reviewed  Nails Reviewed   Diet Order:   Diet Order             Diet Heart Room service appropriate? Yes; Fluid consistency: Thin  Diet effective ____                  EDUCATION NEEDS:  Education needs have been addressed  Skin:  Skin Assessment: Skin Integrity Issues: Skin Integrity Issues:: Incisions Incisions: Back, closed  Last BM:  01/19/21  Height:  Ht Readings from Last 1 Encounters:  01/13/21 5\' 7"  (1.702 m)   Weight:  Wt Readings from Last 1 Encounters:  01/13/21 122.2 kg   BMI: 42 kg/m^2  Estimated Nutritional Needs:  Kcal:  2000-2200 Protein:  115-130 grams Fluid:  >2  L  Derrel Nip, RD, LDN (she/her/hers) Registered Dietitian I After-Hours/Weekend Pager # in [place]

## 2021-01-21 NOTE — Progress Notes (Signed)
Patient refused BIPAP for tonight. No respiratory distress noted. RT instructed patient to have RT called if he changes his mind. RT will monitor as needed.

## 2021-01-21 NOTE — Progress Notes (Signed)
  Echocardiogram 2D Echocardiogram has been performed.  [name] 01/21/2021, 2:53 PM

## 2021-01-21 NOTE — Consult Note (Signed)
[facility]    Date of Admission:  01/20/2021     Reason for Consult: hardware associated vertebral om/epidural abscess; bilateral exudative pleural effusion    Referring Provider: Radonna Ricker   Lines:  Right ue picc from 7/16 admission  Abx: 7/26-c vanc 7/26-c cefepime  Home iv cefazolin        Assessment: 70 yo male previous thoracolumbar hardware/back surgery 03/2020 complicated by thoracic vertebral om/epidural abscess s/p recent I&D 7/19 with cx ecoli (bcx negative at that time), readmitted a few days after discharged on 7/22 for fluid overload and bilateral pleural effusion and fever, s/p bilateral chest tube placement with effusion appearing exudative   #exudative bilateral pleural effusion #fluid overload Effusion appear exudative. Cx in progress. Bcx also in progress Previous admission without effusion or respiratory sx. No hx chf Rather atypical We know he has extensive thoracic spine infection so query spill over from prevertebral area with contiguous pleural involvement (that too would be atypical given the extent of effusion) Rather soon for PICC infection and IE with hematogenous spread involving the pleural space  -agree to get tte -continue broad abx empirically -f/u blood and pleural cx   #hardware associated thoracic vertebral om/epidural abscess S/p I&D 7/19. Cx ecoli Patient unclear if back pain is worse in setting of chest pain/fluid overload-sob Sepsis/fever while on cefazolin ?pleural process vs back process  -await nsg evaluation; will defer to them if back imaging repeat needed; rather confounding and would be helpful for dx with repeat back imaging from ID standpoint -keep bsAbx as above   Discussed with primary team  I spent 60 minute reviewing data/chart, and coordinating care and >50% direct face to face time providing counseling/discussing diagnostics/treatment plan with  patient      ------------------------------------------------ Principal Problem:   Sepsis due to undetermined organism [facility]) Active Problems:   Vertebral osteomyelitis (HCC)   Hardware complicating wound infection (HCC)   Pleural effusion   Anasarca   OSA (obstructive sleep apnea)   Obesity, Class III, BMI 40-49.9 (morbid obesity) (HCC)    HPI: Marco Kennedy is a 70 y.o. male patient known to id service from recent back/hardware associated infection with ecoli s/p I&D 7/19 discharged on cefazolin 7/22 but readmitted 7/26 for volume overload, bilateral exudative pleural effusion and sepsis   Patient did ok for 2 days post discharge but started developing rather quick bilateral le edema, sob, and diffuse chest wall pain. Also subjective fever  He did well in the hospital last admission after surgery. No respiratory sx. Cxr at that time normal. Bcx at that time negative  Told to come back to hospital for reevaluation He was seen in OSH ed and transferred for [place] after chest ct showed bilateral pleural effusion Febrile on admission here Wbc remains high but similar to previous admission Started on vanc/cefepime Bilateral chest tube placed with fluid analysis c/w exudative process; bcx and pleural fluid cx in process  He reports there has been no issue with his picc He has no prior heart failure  He is coughing a little more now after chest tubes placed No n/v/diarrhea/rash, other joint pain He is unclear if his back pain continues to improve as the pain is in similar location like the current chest wall pain      Family History  Problem Relation Age of Onset   Hypertension Mother    Arthritis Mother    Stomach cancer Father  Hypertension Brother     Social History   Tobacco Use   Smoking status: Never   Smokeless tobacco: Never  Vaping Use   Vaping Use: Never used  Substance Use Topics   Alcohol use: Yes    Comment: rare- social    Drug use: No     No Known Allergies  Review of Systems: ROS All Other ROS was negative, except mentioned above   Past Medical History:  Diagnosis Date   Arthritis    arthritis-bilateral knees, left Hip.   Degenerative lumbar spinal stenosis    EKG abnormality    06-08-16- being evaluated by cardiologist  in [place]   Idiopathic scoliosis of lumbar region    Joint stiffness    Knee pain    Leg swelling    Low back pain    Achy, shooting pain.  Hurts to Walk or stand up straight.   Lumbar radiculopathy    Scoliosis concern    Sleep apnea    Bipap use nightly 25/19 -3l/m oxygen   Spondylolisthesis of lumbar region    Transfusion history    [place] ,[place] after bleeding post colon polyp removal and colonscopy procedure       Scheduled Meds:  Chlorhexidine Gluconate Cloth  6 each Topical Daily   docusate sodium  100 mg Oral BID   enoxaparin (LOVENOX) injection  40 mg Subcutaneous Daily   feeding supplement  237 mL Oral BID BM   polyethylene glycol  17 g Oral BID   sodium chloride flush  3 mL Intravenous Q12H   Continuous Infusions:  ceFEPime (MAXIPIME) IV 2 g (01/21/21 1102)   vancomycin     PRN Meds:.acetaminophen **OR** acetaminophen, bisacodyl, dextromethorphan, ketorolac, morphine injection, ondansetron **OR** ondansetron (ZOFRAN) IV, oxyCODONE   OBJECTIVE: Blood pressure (!) 144/73, pulse 100, temperature (!) 100.8 F (38.2 C), temperature source Axillary, resp. rate 16, SpO2 98 %.  Physical Exam General/constitutional: pleasant; anasarca; dry cough during interview HEENT: Normocephalic, PER, Conj Clear, EOMI, Oropharynx clear Neck supple CV: rrr no mrg Lungs: clear to auscultation, normal respiratory effort; bilateral chest tube with serosanguinous output Abd: Soft, Nontender Ext: no edema Skin: No Rash Neuro: nonfocal MSK: no peripheral joint swelling/tenderness/warmth; back spines nontender; dressing c/d Psych alert/oriented   Central line presence: rue picc  site no purulence/erythema/tenderness    Lab Results Lab Results  Component Value Date   WBC 23.2 (H) 01/21/2021   HGB 9.7 (L) 01/21/2021   HCT 30.3 (L) 01/21/2021   MCV 88.1 01/21/2021   PLT 475 (H) 01/21/2021    Lab Results  Component Value Date   CREATININE 0.86 01/21/2021   BUN 18 01/21/2021   NA 134 (L) 01/21/2021   K 4.1 01/21/2021   CL 98 01/21/2021   CO2 29 01/21/2021    Lab Results  Component Value Date   ALT 13 01/20/2021   AST 37 01/20/2021   ALKPHOS 92 01/20/2021   BILITOT 0.5 01/20/2021      Microbiology: Recent Results (from the past 240 hour(s))  Aerobic/Anaerobic Culture w Gram Stain (surgical/deep wound)     Status: None   Collection Time: 01/11/21  3:02 PM   Specimen: Wound  Result Value Ref Range Status   Specimen Description WOUND  Final   Special Requests ABSCESS OF BACK  Final   Gram Stain   Final    FEW WBC PRESENT, PREDOMINANTLY PMN NO ORGANISMS SEEN    Culture   Final    RARE ESCHERICHIA COLI NO ANAEROBES ISOLATED  Performed at [facility], [address], [place], [place] [address]    Report Status 01/16/2021 FINAL  Final   Organism ID, Bacteria ESCHERICHIA COLI  Final      Susceptibility   Escherichia coli - MIC*    AMPICILLIN <=2 SENSITIVE Sensitive     CEFAZOLIN <=4 SENSITIVE Sensitive     CEFEPIME <=0.12 SENSITIVE Sensitive     CEFTAZIDIME <=1 SENSITIVE Sensitive     CEFTRIAXONE <=0.25 SENSITIVE Sensitive     CIPROFLOXACIN <=0.25 SENSITIVE Sensitive     GENTAMICIN <=1 SENSITIVE Sensitive     IMIPENEM <=0.25 SENSITIVE Sensitive     TRIMETH/SULFA <=20 SENSITIVE Sensitive     AMPICILLIN/SULBACTAM <=2 SENSITIVE Sensitive     PIP/TAZO <=4 SENSITIVE Sensitive     * RARE ESCHERICHIA COLI  SARS Coronavirus 2 by RT PCR (hospital order, performed in [facility] hospital lab) Nasopharyngeal Nasopharyngeal Swab     Status: None   Collection Time: 01/13/21  7:53 AM   Specimen: Nasopharyngeal Swab  Result Value Ref Range Status    SARS Coronavirus 2 NEGATIVE NEGATIVE Final    Comment: (NOTE) SARS-CoV-2 target nucleic acids are NOT DETECTED.  The SARS-CoV-2 RNA is generally detectable in upper and lower respiratory specimens during the acute phase of infection. The lowest concentration of SARS-CoV-2 viral copies this assay can detect is 250 copies / mL. A negative result does not preclude SARS-CoV-2 infection and should not be used as the sole basis for treatment or other patient management decisions.  A negative result may occur with improper specimen collection / handling, submission of specimen other than nasopharyngeal swab, presence of viral mutation(s) within the areas targeted by this assay, and inadequate number of viral copies (<250 copies / mL). A negative result must be combined with clinical observations, patient history, and epidemiological information.  Fact Sheet for Patients:   BoilerBrush.com.cy  Fact Sheet for Healthcare Providers: https://pope.com/  This test is not yet approved or  cleared by the [place] FDA and has been authorized for detection and/or diagnosis of SARS-CoV-2 by FDA under an Emergency Use Authorization (EUA).  This EUA will remain in effect (meaning this test can be used) for the duration of the COVID-19 declaration under Section 564(b)(1) of the Act, 21 U.S.C. section 360bbb-3(b)(1), unless the authorization is terminated or revoked sooner.  Performed at [facility], [address], [place], [place] [address]   Aerobic/Anaerobic Culture w Gram Stain (surgical/deep wound)     Status: None   Collection Time: 01/13/21  9:48 AM   Specimen: PATH Other; Tissue  Result Value Ref Range Status   Specimen Description WOUND  Final   Special Requests LEFT THORACOLUMBAR SPEC A  Final   Gram Stain NO WBC SEEN NO ORGANISMS SEEN   Final   Culture   Final    RARE ESCHERICHIA COLI SUSCEPTIBILITIES PERFORMED ON PREVIOUS  CULTURE WITHIN THE LAST 5 DAYS. NO ANAEROBES ISOLATED Performed at [facility], [address] [place], [place] [address]    Report Status 01/18/2021 FINAL  Final  Aerobic/Anaerobic Culture w Gram Stain (surgical/deep wound)     Status: None   Collection Time: 01/13/21  9:55 AM   Specimen: PATH Other; Tissue  Result Value Ref Range Status   Specimen Description TISSUE  Final   Special Requests LEFT THORACOLUMBAR  Final   Gram Stain   Final    RARE WBC PRESENT, PREDOMINANTLY MONONUCLEAR NO ORGANISMS SEEN    Culture  Final    RARE ESCHERICHIA COLI SUSCEPTIBILITIES PERFORMED ON PREVIOUS CULTURE WITHIN THE LAST 5 DAYS. NO ANAEROBES ISOLATED Performed at [facility], [address] [place], [place] [address]    Report Status 01/18/2021 FINAL  Final  Aerobic/Anaerobic Culture w Gram Stain (surgical/deep wound)     Status: None   Collection Time: 01/13/21  9:57 AM   Specimen: PATH Other; Tissue  Result Value Ref Range Status   Specimen Description WOUND  Final   Special Requests LEFT THORACOLUMBAR SPEC C  Final   Gram Stain NO WBC SEEN NO ORGANISMS SEEN   Final   Culture   Final    RARE ESCHERICHIA COLI SUSCEPTIBILITIES PERFORMED ON PREVIOUS CULTURE WITHIN THE LAST 5 DAYS. NO ANAEROBES ISOLATED Performed at [facility], [address] [place], [place] [address]    Report Status 01/18/2021 FINAL  Final  Aerobic/Anaerobic Culture w Gram Stain (surgical/deep wound)     Status: None   Collection Time: 01/13/21  9:58 AM   Specimen: PATH Other; Tissue  Result Value Ref Range Status   Specimen Description WOUND  Final   Special Requests LEFT THORACOLUMBAR SPEC B  Final   Gram Stain NO WBC SEEN NO ORGANISMS SEEN   Final   Culture   Final    RARE ESCHERICHIA COLI SUSCEPTIBILITIES PERFORMED ON PREVIOUS CULTURE WITHIN THE LAST 5 DAYS. NO ANAEROBES ISOLATED Performed at [facility], [address] [place], [place] [address]    Report Status  01/18/2021 FINAL  Final  MRSA Next Gen by PCR, Nasal     Status: None   Collection Time: 01/20/21  5:07 PM   Specimen: Nasal Mucosa; Nasal Swab  Result Value Ref Range Status   MRSA by PCR Next Gen NOT DETECTED NOT DETECTED Final    Comment: (NOTE) The GeneXpert MRSA Assay (FDA approved for NASAL specimens only), is one component of a comprehensive MRSA colonization surveillance program. It is not intended to diagnose MRSA infection nor to guide or monitor treatment for MRSA infections. Test performance is not FDA approved in patients less than 60 years old. Performed at [facility], [address], [place], [place] [address]   Gram stain     Status: None   Collection Time: 01/20/21  6:30 PM   Specimen: Fluid  Result Value Ref Range Status   Specimen Description FLUID LEFT PLEURAL  Final   Special Requests NONE  Final   Gram Stain   Final    MODERATE WBC PRESENT, PREDOMINANTLY PMN NO ORGANISMS SEEN Performed at [facility], [address] [place], [place] [address]    Report Status 01/21/2021 FINAL  Final  Gram stain     Status: None   Collection Time: 01/20/21  6:30 PM   Specimen: Fluid  Result Value Ref Range Status   Specimen Description FLUID RIGHT PLEURAL  Final   Special Requests NONE  Final   Gram Stain   Final    ABUNDANT WBC PRESENT, PREDOMINANTLY PMN NO ORGANISMS SEEN Performed at [facility], [address], [place], [place] [address]    Report Status 01/21/2021 FINAL  Final     Serology:    Imaging: If present, new imagings (plain films, ct scans, and mri) have been personally visualized and interpreted; radiology reports have been reviewed. Decision making incorporated into the Impression / Recommendations.  7/26 cxr  Bilateral chest tube placement Minimal bilateral pleural effusion   Niyanna Asch T  Renold DonVu, MD [facility] [phone] pager    01/21/2021, 11:49 AM

## 2021-01-21 NOTE — Consult Note (Signed)
Chief Complaint   CP and SOB  History of Present Illness  RANBIR CHEW is a 70 y.o. male with a history of previous thoracolumbar surgery by Dr. Vertell Limber.  More recently, he was admitted with wound infection and he underwent operative wound washout about a week ago.  At that time, he was started on IV antibiotics.  After his discharge, wife has been administering cefazolin 3 times a day.  Over the weekend he began to notice progressively worsening pleuritic chest pain and shortness of breath.  He ultimately went to the emergency department and was transferred back to [facility] for further evaluation and management.  Patent does not really complain of worsening back pain.  No new weakness numbness or tingling in the legs.  Past Medical History   Past Medical History:  Diagnosis Date   Arthritis    arthritis-bilateral knees, left Hip.   Degenerative lumbar spinal stenosis    EKG abnormality    06-08-16- being evaluated by cardiologist  in [place]   Idiopathic scoliosis of lumbar region    Joint stiffness    Knee pain    Leg swelling    Low back pain    Achy, shooting pain.  Hurts to Walk or stand up straight.   Lumbar radiculopathy    Scoliosis concern    Sleep apnea    Bipap use nightly 25/19 -3l/m oxygen   Spondylolisthesis of lumbar region    Transfusion history    [place] ,[place] after bleeding post colon polyp removal and colonscopy procedure    Past Surgical History   Past Surgical History:  Procedure Laterality Date   ABDOMINAL EXPOSURE N/A 04/18/2020   Procedure: ABDOMINAL EXPOSURE;  Surgeon: Marty Heck, MD;  Location: [place];  Service: Vascular;  Laterality: N/A;   ANTERIOR LAT LUMBAR FUSION Left 04/18/2020   Procedure: ANTERIOR LATERAL LUMBAR FUSION LUMBAR TWO-THREE, LUMBAR THREE-FOUR;  Surgeon: Erline Levine, MD;  Location: [place];  Service: Neurosurgery;  Laterality: Left;   ANTERIOR LUMBAR FUSION N/A 04/18/2020   Procedure: Lumbar Four-Five Lumbar Five-  Sacral One Anterior lumbar Interbody Fusion;  Surgeon: Erline Levine, MD;  Location: [facility];  Service: Neurosurgery;  Laterality: N/A;   APPLICATION OF INTRAOPERATIVE CT SCAN N/A 04/21/2020   Procedure: APPLICATION OF INTRAOPERATIVE CT SCAN;  Surgeon: Erline Levine, MD;  Location: [place];  Service: Neurosurgery;  Laterality: N/A;   BACK SURGERY     lumbar disc surgery   COLONOSCOPY W/ POLYPECTOMY     HERNIA REPAIR Left    left inguinal hernia repair   KNEE ARTHROSCOPY Right 2003    -scope for meniscus   LUMBAR WOUND DEBRIDEMENT N/A 01/13/2021   Procedure: THORACOLUMBAR WOUND DEBRIDEMENT;  Surgeon: Erline Levine, MD;  Location: [facility];  Service: Neurosurgery;  Laterality: N/A;   POSTERIOR LUMBAR FUSION 4 LEVEL N/A 04/21/2020   Procedure: Thoracic ten to pelvis pedicle screw fixation with osteotomies;  Surgeon: Erline Levine, MD;  Location: [place];  Service: Neurosurgery;  Laterality: N/A;   TOTAL HIP ARTHROPLASTY Left 06/15/2016   Procedure: LEFT TOTAL HIP ARTHROPLASTY ANTERIOR APPROACH;  Surgeon: Paralee Cancel, MD;  Location: [facility];  Service: Orthopedics;  Laterality: Left;    Social History   Social History   Tobacco Use   Smoking status: Never   Smokeless tobacco: Never  Vaping Use   Vaping Use: Never used  Substance Use Topics   Alcohol use: Yes    Comment: rare- social    Drug use: No  Medications   Prior to Admission medications   Medication Sig Start Date End Date Taking? Authorizing Provider  ceFAZolin (ANCEF) IVPB Inject 2 g into the vein every 8 (eight) hours. Indication:  vertebral osteomyelitis First Dose: No Last Day of Therapy:  02/24/2021 Labs - Once weekly:  CBC/D and BMP, Labs - Every other week:  ESR and CRP Method of administration: IV Push Method of administration may be changed at the discretion of home infusion pharmacist based upon assessment of the patient and/or caregiver's ability to self-administer the medication ordered. 01/15/21 02/24/21  Marvis Moeller, NP  cholecalciferol (VITAMIN D3) 25 MCG (1000 UNIT) tablet Take 1,000 Units by mouth daily.    [provider]  diclofenac (VOLTAREN) 75 MG EC tablet Take 75 mg by mouth 2 (two) times daily. 11/13/20   [provider]  Multiple Vitamin (MULTIVITAMIN WITH MINERALS) TABS tablet Take 1 tablet by mouth daily.    [provider]  oxyCODONE (OXY IR/ROXICODONE) 5 MG immediate release tablet Take 1 tablet (5 mg total) by mouth every 6 (six) hours as needed for moderate pain. 01/15/21   Little Ishikawa, MD  polyethylene glycol (MIRALAX / GLYCOLAX) 17 g packet Take 17 g by mouth 2 (two) times daily. 01/15/21   Little Ishikawa, MD    Allergies  No Known Allergies  Review of Systems  ROS  Neurologic Exam  Awake, alert, oriented Memory and concentration grossly intact Speech fluent, appropriate CN grossly intact Motor exam: Upper Extremities Deltoid Bicep Tricep Grip  Right 5/5 5/5 5/5 5/5  Left 5/5 5/5 5/5 5/5   Lower Extremities IP Quad PF DF EHL  Right 5/5 5/5 5/5 5/5 5/5  Left 5/5 5/5 5/5 5/5 5/5   Sensation grossly intact to LT  Bilateral chest tubes in place Wound dressing c/d/I, no drainage  Imaging  While I am unable to review the actual images, report of CT scan done at outside emergency department indicates findings consistent with osteomyelitis/diskitis at T10 as well as the T9-10 and T10-11 disc spaces.  There is also lucency noted about the right T10 pedicle screw.  Impression  - 70 y.o. male With previous thoracolumbar surgery and  approximately one week status post wound washout.  Patient has been on IV antibiotics now for approximately 11 days.    Patient remains neurologically at baseline, and while his CT does indicate osteomyelitis/ diskitis as well as some screw loosening on the right at T10, I would not consider removal of hardware unless he failed adequate antibiotic treatment.  At this point he has only been on antibiotics  for one and half weeks.  Plan  - Would continue with IV antibiotics per ID - Mgmt of pleural infection / chest tubes per PCCM - Does not need repeat wound exploration/washout or hardware removal at this point. Would recommend repeat MRI T/L spine in 3-4 weeks. Pt does not have any change in neurologic condition, had surgery about 8 days ago, and has been on IV abx since then.  I have reviewed the above with the patient and his wife. All questions today were answered.  Consuella Lose, MD [facility]

## 2021-01-21 NOTE — Progress Notes (Signed)
NAME:  Marco Kennedy, MRN:  [id number], DOB:  [birth year], LOS: 1 ADMISSION DATE:  01/20/2021, CONSULTATION DATE:  01/20/21 REFERRING MD:  Ophelia Charter, CHIEF COMPLAINT:  SOB   History of Present Illness:  70 year old man with hx of OSA on CPAP, recent lumbar hardware infection, epidural abscess who was Dc'd on prolonged IV antibiotics on 7/16 presenting with progressive SOB, chest pain, fevers, chills, and shoulder discomfort.  Imaging at [facility] ED revealed continued infection in thoracolumbar areas as well as new likely spread to pleura.  PCCM consulted to help manage pleural infection.  Pertinent  Medical History  OSA Pelvic hardware infection detailed in DC summary 7/22  Significant Hospital Events: Including procedures, antibiotic start and stop dates in addition to other pertinent events   7/26 admitted from [place] ED  Interim History / Subjective:  consulted  Objective   7/27: following pt for chest tubes that were placed 7/26 in ed for  pleural effusion with suspected infection. Wbc 23.2 from 29. Cultures from fluid pending  Examination: General: no acute distress HENT: MMM, malampatti 4 Lungs: Diminished bases with bilateral simple dependent moderate sized pleural effusions Cardiovascular: RRR, ext warm Abdomen: soft, +BS Extremities: trace edema Neuro: moves all 4 ext to command Skin: some ?yeast, rashes around skin folds  Resolved Hospital Problem list   NOS  Assessment & Plan:  Acute hypoxemic respiratory failure secondary to pleural spread of known extensive thoracolumbar-pelvic area. - Bilateral pigtails, send for usual studies - f/u admission labs - patient had good bit of pleurisy with chest tubes on suction, fine to leave to waterseal tonight -cxr today pending -outpt from chest tubes 1730 total (950 L / 780 R) -add Toradol for pleurisy    Best Practice (right click and "Reselect all SmartList Selections" daily)  Per primary  Labs   CBC: Recent Labs  Lab  01/15/21 0600 01/20/21 2039 01/21/21 0025  WBC 20.6* 29.0* 23.2*  NEUTROABS  --  26.2*  --   HGB 10.8* 8.9* 9.7*  HCT 33.3* 28.4* 30.3*  MCV 87.4 88.5 88.1  PLT 274 594* 475*    Basic Metabolic Panel: Recent Labs  Lab 01/15/21 0600 01/20/21 2039 01/21/21 0025  NA 135 134* 134*  K 3.6 4.2 4.1  CL 102 99 98  CO2 26 28 29   GLUCOSE 154* 162* 152*  BUN 32* 20 18  CREATININE 1.15 0.81 0.86  CALCIUM 7.6* 7.9* 7.6*   GFR: Estimated Creatinine Clearance: 100 mL/min (by C-G formula based on SCr of 0.86 mg/dL). Recent Labs  Lab 01/15/21 0600 01/20/21 2039 01/20/21 2050 01/21/21 0025  PROCALCITON  --  1.01  --   --   WBC 20.6* 29.0*  --  23.2*  LATICACIDVEN  --   --  1.3 0.8    Liver Function Tests: Recent Labs  Lab 01/15/21 0600 01/20/21 2039  AST 66* 37  ALT 70* 13  ALKPHOS 81 92  BILITOT 1.3* 0.5  PROT 4.8* 5.1*  ALBUMIN 1.9* 1.7*   No results for input(s): LIPASE, AMYLASE in the last 168 hours. No results for input(s): AMMONIA in the last 168 hours.  ABG No results found for: PHART, PCO2ART, PO2ART, HCO3, TCO2, ACIDBASEDEF, O2SAT   Coagulation Profile: No results for input(s): INR, PROTIME in the last 168 hours.  Cardiac Enzymes: No results for input(s): CKTOTAL, CKMB, CKMBINDEX, TROPONINI in the last 168 hours.  HbA1C: Hgb A1c MFr Bld  Date/Time Value Ref Range Status  01/11/2021 05:00 AM 5.9 (H) 4.8 -  5.6 % Final    Comment:    (NOTE) Pre diabetes:          5.7%-6.4%  Diabetes:              >6.4%  Glycemic control for   <7.0% adults with diabetes     CBG: No results for input(s): GLUCAP in the last 168 hours.  care time 25 mins. This represents my time independent of the NPs time taking care of the pt. This is excluding procedures.    Briant Sites DO [facility] 01/21/2021, 8:51 AM See Amion for pager If no response to pager, please call [phone] until 1900 After 1900 please call [facility] [phone]

## 2021-01-21 NOTE — Progress Notes (Signed)
Pharmacy Antibiotic Note  Marco Kennedy is a 70 y.o. 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  Pt had recent admission from 7/16-7/22 for sepsis secondary to abscess near pelvic hardware. Pt grew E coli from wound cx and several surgical cx and was discharged on cefazolin with plan for 6 wks of IV OPAT. Pt presented to [facility] on 7/25 evening with sepsis. Pharmacy has been consulted for vancomycin and cefepime dosing.  Given no evidence of CNS involvement on imaging per MD, will switch vancomycin traditional dosing to AUC dosing.    Plan: Continue cefepime 2gm IV q 8 h Vancomycin 1000 mg IV Q 12 hrs. Goal AUC 400-550. Expected AUC: 492.8 SCr used: 0.86 Monitor WBC, temp, clinical improvement, cultures, renal function, vancomycin levels  Temp (24hrs), Avg:99.4 F (37.4 C), Min:98.6 F (37 C), Max:100.8 F (38.2 C)  Recent Labs  Lab 01/15/21 0600 01/20/21 2039 01/20/21 2050 01/21/21 0025  WBC 20.6* 29.0*  --  23.2*  CREATININE 1.15 0.81  --  0.86  LATICACIDVEN  --   --  1.3 0.8  VANCORANDOM  --  12  --   --      Estimated Creatinine Clearance: 100 mL/min (by C-G formula based on SCr of 0.86 mg/dL).    No Known Allergies  Antimicrobials this admission: Cefepime 7/26 >> Vancomycin 7/26 >>  Microbiology results: 7/27: Ucx pending 7/26 Bcx: pending 7/17 Wound/abscess on back: pan-sensitive E coli 7/19 Surgical cx (from I&D): E coli  Thank you for involving pharmacy in this patient's care.  Arnette Felts, PharmD PGY1 Ambulatory Care Pharmacy Resident 01/21/2021 11:28 AM  **Pharmacist phone directory can be found on [email or web address] listed under [facility]**

## 2021-01-21 NOTE — Progress Notes (Signed)
[facility] PROGRESS NOTE    Progress Note  Marco Kennedy  JGG:[id number] DOB: 08/19/1950 DOA: 01/20/2021 PCP: Maximiano Coss, MD     Brief Narrative:   Marco Kennedy is an 70 y.o. male past medical history of obstructive sleep apnea on CPAP recent discharge from the hospital 01/10/2021 for sepsis secondary to moderate-sized abscess near the pelvic hardware during this time neurosurgery IR and ID were involved in his care he underwent wound debridement on 01/13/2021 status post back surgery insertion of hardware, E. coli grew from wound, was treated with IV cefazolin and the plan was to continue 6 weeks of IV antibiotics via PICC line.  Went home on 01/16/2021 started complaining of rib pain he has gained 10 pounds since hospital discharge.  Since discharge she has been taking diclofenac twice a day for knee pain.  He started having fevers, transfer from [facility] with sepsis hypoxic which improved with 4 L of oxygen.  CT of the chest show empyema as well as discitis osteomyelitis of T10 with a white blood cell count of 10 he was started empirically on IV vancomycin and Ancef*chec with normal renal function.  Neurosurgery consulted in the ED     Assessment/Plan:   Sepsis secondary from discitis and osteomyelitis and bilateral empyema: Patient currently had to be placed on oxygen for respiratory failure. He was started empirically on antibiotics and fluid resuscitated. Blood cultures are pending. Antibiotic regimen was changed to IV vancomycin and cefepime ID Dr. Algis Liming was consulted recommended to continue IV antibiotics and culture the patient and they will evaluate in the morning. Check a 2D echo might need a TEE. ID also recommended to leave PICC line in place if culture negative.  Acute respiratory failure with hypoxia secondary to bilateral empyema: Pulmonary was consulted bilateral pigtail catheter placed cultures have been sent. Further management of  chest tube per pulmonary.  Anasarca: Likely due to chronic inflammation in the setting of hypoalbuminemia. Consult nutrition start him on Ensure 3 times daily.  Obstructive sleep apnea: Continue CPAP at night.  Constipation: Daily MiraLAX p.o. twice daily.  Obesity: With a BMI of 42 counseling.  DVT prophylaxis: lovenox Family Communication:none Status is: Inpatient  Remains inpatient appropriate because:Hemodynamically unstable  Dispo: The patient is from: Home              Anticipated d/c is to: SNF              Patient currently is not medically stable to d/c.   Difficult to place patient No        Code Status:     Code Status Orders  (From admission, onward)           Start     Ordered   01/20/21 1741  Full code  Continuous        01/20/21 1749           Code Status History     Date Active Date Inactive Code Status Order ID Comments User Context   01/11/2021 0450 01/16/2021 2221 Full Code [id number]  Darlin Drop, DO Inpatient   04/18/2020 1425 05/08/2020 0124 Full Code [id number]  [name], MD Inpatient   06/15/2016 1801 06/16/2016 1832 Full Code [id number]  [name], PA-C Inpatient         IV Access:   Peripheral IV   Procedures and diagnostic studies:   DG Chest 1 View  Result Date: 01/20/2021 CLINICAL DATA:  Status post bilateral chest  tube placement after spread of spinal infection to the chest. EXAM: CHEST  1 VIEW COMPARISON:  Single-view of the chest 01/10/2021. FINDINGS: Pigtail catheters are in place bilaterally. The patient also has a right PICC with its tip in the upper to mid superior vena cava. No pneumothorax. Mild basilar opacities and small bilateral pleural effusions noted. Heart size is normal. IMPRESSION: Bilateral chest tubes in place.  No pneumothorax. Small bilateral pleural effusions. Mild basilar airspace disease is likely atelectasis. Tip of right PICC projects in the upper to mid superior vena cava.  Electronically Signed   By: Drusilla Kanner M.D.   On: 01/20/2021 19:53     Medical Consultants:   None.   Subjective:    Marco Kennedy pain is controlled.  Objective:    Vitals:   01/21/21 0305 01/21/21 0320 01/21/21 0620 01/21/21 0719  BP: (!) 122/50 (!) 115/53 (!) 123/50 127/63  Pulse: 87 90 83 96  Resp:    19  Temp: 98.6 F (37 C) 98.6 F (37 C)  99.6 F (37.6 C)  TempSrc: Oral Oral  Oral  SpO2: 96% 97% 96% 97%   SpO2: 97 %   Intake/Output Summary (Last 24 hours) at 01/21/2021 1036 Last data filed at 01/21/2021 0700 Gross per 24 hour  Intake 950 ml  Output 2630 ml  Net -1680 ml   There were no vitals filed for this visit.  Exam: General exam: In no acute distress. Respiratory system: Good air movement and clear to auscultation. Cardiovascular system: S1 & S2 heard, RRR. No JVD. Gastrointestinal system: Abdomen is nondistended, soft and nontender.  Extremities: No pedal edema. Skin: No rashes, lesions or ulcers Psychiatry: Judgement and insight appear normal. Mood & affect appropriate.    Data Reviewed:    Labs: Basic Metabolic Panel: Recent Labs  Lab 01/15/21 0600 01/20/21 2039 01/21/21 0025  NA 135 134* 134*  K 3.6 4.2 4.1  CL 102 99 98  CO2 26 28 29   GLUCOSE 154* 162* 152*  BUN 32* 20 18  CREATININE 1.15 0.81 0.86  CALCIUM 7.6* 7.9* 7.6*   GFR Estimated Creatinine Clearance: 100 mL/min (by C-G formula based on SCr of 0.86 mg/dL). Liver Function Tests: Recent Labs  Lab 01/15/21 0600 01/20/21 2039  AST 66* 37  ALT 70* 13  ALKPHOS 81 92  BILITOT 1.3* 0.5  PROT 4.8* 5.1*  ALBUMIN 1.9* 1.7*   No results for input(s): LIPASE, AMYLASE in the last 168 hours. No results for input(s): AMMONIA in the last 168 hours. Coagulation profile No results for input(s): INR, PROTIME in the last 168 hours. COVID-19 Labs  Recent Labs    01/20/21 2039  CRP 35.8*    Lab Results  Component Value Date   SARSCOV2NAA NEGATIVE 01/13/2021    SARSCOV2NAA NEGATIVE 05/07/2020   SARSCOV2NAA NEGATIVE 04/15/2020    CBC: Recent Labs  Lab 01/15/21 0600 01/20/21 2039 01/21/21 0025  WBC 20.6* 29.0* 23.2*  NEUTROABS  --  26.2*  --   HGB 10.8* 8.9* 9.7*  HCT 33.3* 28.4* 30.3*  MCV 87.4 88.5 88.1  PLT 274 594* 475*   Cardiac Enzymes: No results for input(s): CKTOTAL, CKMB, CKMBINDEX, TROPONINI in the last 168 hours. BNP (last 3 results) No results for input(s): PROBNP in the last 8760 hours. CBG: No results for input(s): GLUCAP in the last 168 hours. D-Dimer: No results for input(s): DDIMER in the last 72 hours. Hgb A1c: No results for input(s): HGBA1C in the last 72 hours. Lipid Profile:  No results for input(s): CHOL, HDL, LDLCALC, TRIG, CHOLHDL, LDLDIRECT in the last 72 hours. Thyroid function studies: No results for input(s): TSH, T4TOTAL, T3FREE, THYROIDAB in the last 72 hours.  Invalid input(s): FREET3 Anemia work up: No results for input(s): VITAMINB12, FOLATE, FERRITIN, TIBC, IRON, RETICCTPCT in the last 72 hours. Sepsis Labs: Recent Labs  Lab 01/15/21 0600 01/20/21 2039 01/20/21 2050 01/21/21 0025  PROCALCITON  --  1.01  --   --   WBC 20.6* 29.0*  --  23.2*  LATICACIDVEN  --   --  1.3 0.8   Microbiology Recent Results (from the past 240 hour(s))  Aerobic/Anaerobic Culture w Gram Stain (surgical/deep wound)     Status: None   Collection Time: 01/11/21  3:02 PM   Specimen: Wound  Result Value Ref Range Status   Specimen Description WOUND  Final   Special Requests ABSCESS OF BACK  Final   Gram Stain   Final    FEW WBC PRESENT, PREDOMINANTLY PMN NO ORGANISMS SEEN    Culture   Final    RARE ESCHERICHIA COLI NO ANAEROBES ISOLATED Performed at [facility], [address], [place], [place] [address]    Report Status 01/16/2021 FINAL  Final   Organism ID, Bacteria ESCHERICHIA COLI  Final      Susceptibility   Escherichia coli - MIC*    AMPICILLIN <=2 SENSITIVE Sensitive     CEFAZOLIN <=4  SENSITIVE Sensitive     CEFEPIME <=0.12 SENSITIVE Sensitive     CEFTAZIDIME <=1 SENSITIVE Sensitive     CEFTRIAXONE <=0.25 SENSITIVE Sensitive     CIPROFLOXACIN <=0.25 SENSITIVE Sensitive     GENTAMICIN <=1 SENSITIVE Sensitive     IMIPENEM <=0.25 SENSITIVE Sensitive     TRIMETH/SULFA <=20 SENSITIVE Sensitive     AMPICILLIN/SULBACTAM <=2 SENSITIVE Sensitive     PIP/TAZO <=4 SENSITIVE Sensitive     * RARE ESCHERICHIA COLI  SARS Coronavirus 2 by RT PCR (hospital order, performed in [facility] hospital lab) Nasopharyngeal Nasopharyngeal Swab     Status: None   Collection Time: 01/13/21  7:53 AM   Specimen: Nasopharyngeal Swab  Result Value Ref Range Status   SARS Coronavirus 2 NEGATIVE NEGATIVE Final    Comment: (NOTE) SARS-CoV-2 target nucleic acids are NOT DETECTED.  The SARS-CoV-2 RNA is generally detectable in upper and lower respiratory specimens during the acute phase of infection. The lowest concentration of SARS-CoV-2 viral copies this assay can detect is 250 copies / mL. A negative result does not preclude SARS-CoV-2 infection and should not be used as the sole basis for treatment or other patient management decisions.  A negative result may occur with improper specimen collection / handling, submission of specimen other than nasopharyngeal swab, presence of viral mutation(s) within the areas targeted by this assay, and inadequate number of viral copies (<250 copies / mL). A negative result must be combined with clinical observations, patient history, and epidemiological information.  Fact Sheet for Patients:   BoilerBrush.com.cy  Fact Sheet for Healthcare Providers: https://pope.com/  This test is not yet approved or  cleared by the [place] FDA and has been authorized for detection and/or diagnosis of SARS-CoV-2 by FDA under an Emergency Use Authorization (EUA).  This EUA will remain in effect (meaning this test can  be used) for the duration of the COVID-19 declaration under Section 564(b)(1) of the Act, 21 U.S.C. section 360bbb-3(b)(1), unless the authorization is terminated or revoked sooner.  Performed at [facility], [address] [place], [place] [address]   Aerobic/Anaerobic Culture w Gram Stain (surgical/deep wound)     Status: None   Collection Time: 01/13/21  9:48 AM   Specimen: PATH Other; Tissue  Result Value Ref Range Status   Specimen Description WOUND  Final   Special Requests LEFT THORACOLUMBAR SPEC A  Final   Gram Stain NO WBC SEEN NO ORGANISMS SEEN   Final   Culture   Final    RARE ESCHERICHIA COLI SUSCEPTIBILITIES PERFORMED ON PREVIOUS CULTURE WITHIN THE LAST 5 DAYS. NO ANAEROBES ISOLATED Performed at [facility], [address] [place], [place] [address]    Report Status 01/18/2021 FINAL  Final  Aerobic/Anaerobic Culture w Gram Stain (surgical/deep wound)     Status: None   Collection Time: 01/13/21  9:55 AM   Specimen: PATH Other; Tissue  Result Value Ref Range Status   Specimen Description TISSUE  Final   Special Requests LEFT THORACOLUMBAR  Final   Gram Stain   Final    RARE WBC PRESENT, PREDOMINANTLY MONONUCLEAR NO ORGANISMS SEEN    Culture   Final    RARE ESCHERICHIA COLI SUSCEPTIBILITIES PERFORMED ON PREVIOUS CULTURE WITHIN THE LAST 5 DAYS. NO ANAEROBES ISOLATED Performed at [facility], [address] [place], [place] [address]    Report Status 01/18/2021 FINAL  Final  Aerobic/Anaerobic Culture w Gram Stain (surgical/deep wound)     Status: None   Collection Time: 01/13/21  9:57 AM   Specimen: PATH Other; Tissue  Result Value Ref Range Status   Specimen Description WOUND  Final   Special Requests LEFT THORACOLUMBAR SPEC C  Final   Gram Stain NO WBC SEEN NO ORGANISMS SEEN   Final   Culture   Final    RARE ESCHERICHIA COLI SUSCEPTIBILITIES PERFORMED ON PREVIOUS CULTURE WITHIN THE LAST 5 DAYS. NO ANAEROBES ISOLATED Performed at  [facility], [address] [place], [place] [address]    Report Status 01/18/2021 FINAL  Final  Aerobic/Anaerobic Culture w Gram Stain (surgical/deep wound)     Status: None   Collection Time: 01/13/21  9:58 AM   Specimen: PATH Other; Tissue  Result Value Ref Range Status   Specimen Description WOUND  Final   Special Requests LEFT THORACOLUMBAR SPEC B  Final   Gram Stain NO WBC SEEN NO ORGANISMS SEEN   Final   Culture   Final    RARE ESCHERICHIA COLI SUSCEPTIBILITIES PERFORMED ON PREVIOUS CULTURE WITHIN THE LAST 5 DAYS. NO ANAEROBES ISOLATED Performed at [facility], [address] [place], [place] [address]    Report Status 01/18/2021 FINAL  Final  MRSA Next Gen by PCR, Nasal     Status: None   Collection Time: 01/20/21  5:07 PM   Specimen: Nasal Mucosa; Nasal Swab  Result Value Ref Range Status   MRSA by PCR Next Gen NOT DETECTED NOT DETECTED Final    Comment: (NOTE) The GeneXpert MRSA Assay (FDA approved for NASAL specimens only), is one component of a comprehensive MRSA colonization surveillance program. It is not intended to diagnose MRSA infection nor to guide or monitor treatment for MRSA infections. Test performance is not FDA approved in patients less than 70 years old. Performed at [facility], [address] [place], [place] [address]   Gram stain     Status: None   Collection Time: 01/20/21  6:30 PM   Specimen: Fluid  Result Value Ref Range Status   Specimen  Description FLUID LEFT PLEURAL  Final   Special Requests NONE  Final   Gram Stain   Final    MODERATE WBC PRESENT, PREDOMINANTLY PMN NO ORGANISMS SEEN Performed at [facility], [address], [place], [place] [address]    Report Status 01/21/2021 FINAL  Final  Gram stain     Status: None   Collection Time: 01/20/21  6:30 PM   Specimen: Fluid  Result Value Ref Range Status   Specimen Description FLUID RIGHT PLEURAL  Final   Special Requests NONE  Final   Gram Stain    Final    ABUNDANT WBC PRESENT, PREDOMINANTLY PMN NO ORGANISMS SEEN Performed at [facility], [address] [place], [place] [address]    Report Status 01/21/2021 FINAL  Final     Medications:    Chlorhexidine Gluconate Cloth  6 each Topical Daily   docusate sodium  100 mg Oral BID   polyethylene glycol  17 g Oral BID   sodium chloride flush  3 mL Intravenous Q12H   Continuous Infusions:  ceFEPime (MAXIPIME) IV 2 g (01/21/21 0323)   vancomycin        LOS: 1 day   Marinda Elk  [facility]  01/21/2021, 10:36 AM

## 2021-01-22 ENCOUNTER — Encounter (HOSPITAL_COMMUNITY): Payer: Self-pay | Admitting: Certified Registered Nurse Anesthetist

## 2021-01-22 ENCOUNTER — Inpatient Hospital Stay (HOSPITAL_COMMUNITY): Payer: BC Managed Care – PPO

## 2021-01-22 DIAGNOSIS — T847XXD Infection and inflammatory reaction due to other internal orthopedic prosthetic devices, implants and grafts, subsequent encounter: Secondary | ICD-10-CM | POA: Diagnosis not present

## 2021-01-22 DIAGNOSIS — J90 Pleural effusion, not elsewhere classified: Secondary | ICD-10-CM | POA: Diagnosis not present

## 2021-01-22 DIAGNOSIS — A419 Sepsis, unspecified organism: Secondary | ICD-10-CM | POA: Diagnosis not present

## 2021-01-22 DIAGNOSIS — Z9689 Presence of other specified functional implants: Secondary | ICD-10-CM | POA: Diagnosis not present

## 2021-01-22 DIAGNOSIS — M4620 Osteomyelitis of vertebra, site unspecified: Secondary | ICD-10-CM | POA: Diagnosis not present

## 2021-01-22 DIAGNOSIS — R601 Generalized edema: Secondary | ICD-10-CM | POA: Diagnosis not present

## 2021-01-22 LAB — URINE CULTURE: Culture: NO GROWTH

## 2021-01-22 LAB — PATHOLOGIST SMEAR REVIEW

## 2021-01-22 IMAGING — DX DG CHEST 1V PORT
1 series · 1 of 1 positions shown · non-contrast
Comparison: [DATE]

CLINICAL DATA: Pleural effusion.  Bilateral chest tubes.

EXAM:
PORTABLE CHEST 1 VIEW

[chest]
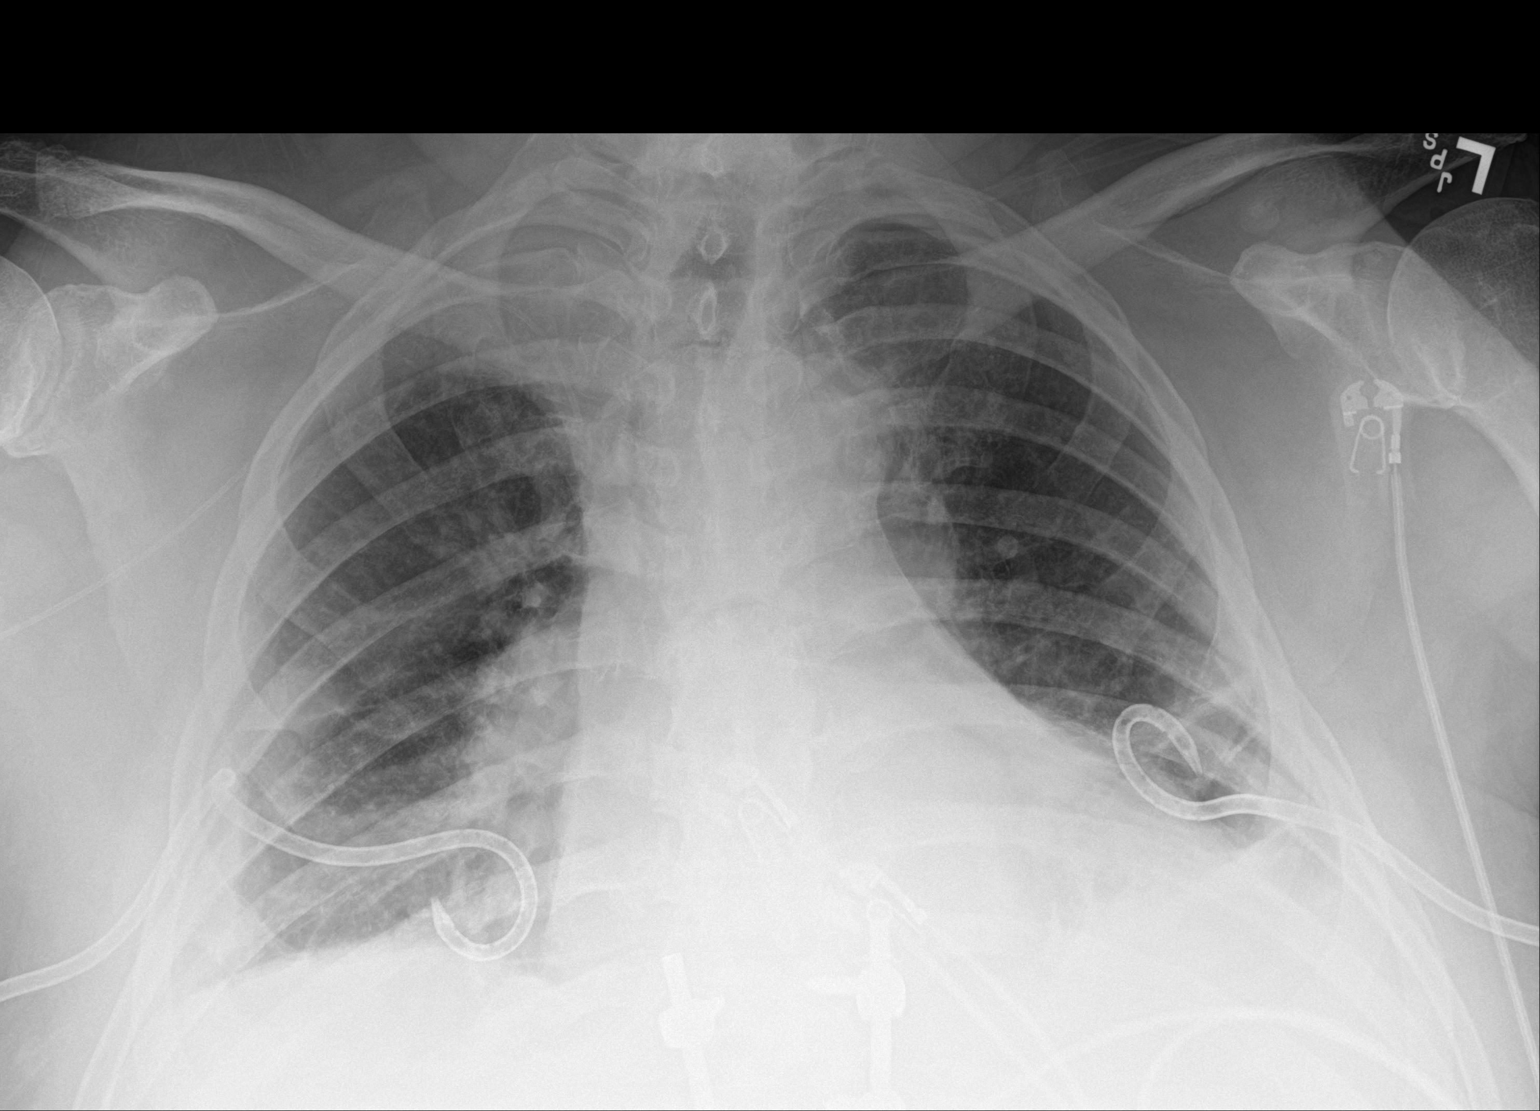

[1 of 1 positions shown; findings below may reference images not displayed]

FINDINGS: Bilateral chest tubes in stable position. No pneumothorax. Right
PICC line in stable position. Stable cardiomegaly. Persistent
bibasilar atelectasis/infiltrates and small bilateral pleural
effusions. Degenerative change thoracic spine. Prior thoracolumbar
spine fusion.
IMPRESSION: 1.  Bilateral chest tubes in stable position.  No pneumothorax.

2. Persistent bibasilar atelectasis/infiltrates and small bilateral
pleural effusions.

3.  Stable cardiomegaly.

## 2021-01-22 MED ORDER — POLYETHYLENE GLYCOL 3350 17 G PO PACK
17.0000 g | PACK | Freq: Every day | ORAL | Status: DC | PRN
  Administered 2021-01-23 – 2021-01-30 (×2): 17 g via ORAL
  Filled 2021-01-22 (×2): qty 1

## 2021-01-22 NOTE — Progress Notes (Signed)
During the beginning of shift last night, pt had a temp of 101.3 orally. IS given and education completed with pt as well as prn medications give was effective and temp 99.4 rechecked, pt remained stable during remainder of shift and resting comfortably in bed with call light within reach and bed alarm on. Bilateral chest tubes remain unremarkable and intact. Dionne Bucy RN

## 2021-01-22 NOTE — Progress Notes (Signed)
[facility] PROGRESS NOTE    Progress Note  Marco Kennedy  WYO:[id number] DOB: 11/28/1950 DOA: 01/20/2021 PCP: Maximiano Coss, MD     Brief Narrative:   Marco Kennedy is an 70 y.o. male past medical history of obstructive sleep apnea on CPAP recent discharge from the hospital 01/10/2021 for sepsis secondary to moderate-sized abscess near the pelvic hardware during this time neurosurgery IR and ID were involved in his care he underwent wound debridement on 01/13/2021 status post back surgery insertion of hardware, E. coli grew from wound, was treated with IV cefazolin and the plan was to continue 6 weeks of IV antibiotics via PICC line.  Went home on 01/16/2021 started complaining of rib pain he has gained 10 pounds since hospital discharge.  Since discharge she has been taking diclofenac twice a day for knee pain.  He started having fevers, transfer from [facility] with sepsis hypoxic which improved with 4 L of oxygen.  CT of the chest show empyema as well as discitis osteomyelitis of T10 with a white blood cell count of 10 he was started empirically on IV vancomycin and Ancef*chec with normal renal function.  Neurosurgery consulted in the ED     Assessment/Plan:   Sepsis secondary from discitis and osteomyelitis and bilateral empyema in the setting of hardware: Patient currently had to be placed on oxygen for respiratory failure. He was started empirically on antibiotics and fluid resuscitated. Blood cultures are pending. Pleural fluid cultures are pending Antibiotic regimen was changed to IV vancomycin and cefepime ID Dr. Algis Liming was consulted recommended to continue IV antibiotics and culture the patient and they will evaluate in the morning. 2D echo showed an EF of 65% no wall motion abnormalities no vegetation seen's. Will let cardiology know we need a TEE. ID also recommended to leave PICC line in place if culture negative. Neurosurgery was consulted who  recommended no further hardware washout or removal.  Recommend an MRI in 3 to 4 weeks.  Acute respiratory failure with hypoxia secondary to bilateral empyema: Pulmonary was consulted bilateral pigtail catheter pleural space cultures are negative till date. Further management of chest tube per pulmonary.  Anasarca: Likely due to chronic inflammation in the setting of hypoalbuminemia. Consult nutrition start him on Ensure 3 times daily.  Obstructive sleep apnea: Continue CPAP at night.  Constipation: On MiraLAX as needed.  Obesity: With a BMI of 42 counseling.  DVT prophylaxis: lovenox Family Communication:none Status is: Inpatient  Remains inpatient appropriate because:Hemodynamically unstable  Dispo: The patient is from: Home              Anticipated d/c is to: SNF              Patient currently is not medically stable to d/c.   Difficult to place patient No        Code Status:     Code Status Orders  (From admission, onward)           Start     Ordered   01/20/21 1741  Full code  Continuous        01/20/21 1749           Code Status History     Date Active Date Inactive Code Status Order ID Comments User Context   01/11/2021 0450 01/16/2021 2221 Full Code [id number]  Darlin Drop, DO Inpatient   04/18/2020 1425 05/08/2020 0124 Full Code [id number]  [name], MD Inpatient   06/15/2016 1801 06/16/2016 1832  Full Code [id number]  [name], PA-C Inpatient         IV Access:   Peripheral IV   Procedures and diagnostic studies:   DG Chest 1 View  Result Date: 01/20/2021 CLINICAL DATA:  Status post bilateral chest tube placement after spread of spinal infection to the chest. EXAM: CHEST  1 VIEW COMPARISON:  Single-view of the chest 01/10/2021. FINDINGS: Pigtail catheters are in place bilaterally. The patient also has a right PICC with its tip in the upper to mid superior vena cava. No pneumothorax. Mild basilar opacities and small  bilateral pleural effusions noted. Heart size is normal. IMPRESSION: Bilateral chest tubes in place.  No pneumothorax. Small bilateral pleural effusions. Mild basilar airspace disease is likely atelectasis. Tip of right PICC projects in the upper to mid superior vena cava. Electronically Signed   By: Drusilla Kanner M.D.   On: 01/20/2021 19:53   DG CHEST PORT 1 VIEW  Result Date: 01/22/2021 CLINICAL DATA:  Pleural effusion.  Bilateral chest tubes. EXAM: PORTABLE CHEST 1 VIEW COMPARISON:  01/20/2021 FINDINGS: Bilateral chest tubes in stable position. No pneumothorax. Right PICC line in stable position. Stable cardiomegaly. Persistent bibasilar atelectasis/infiltrates and small bilateral pleural effusions. Degenerative change thoracic spine. Prior thoracolumbar spine fusion. IMPRESSION: 1.  Bilateral chest tubes in stable position.  No pneumothorax. 2. Persistent bibasilar atelectasis/infiltrates and small bilateral pleural effusions. 3.  Stable cardiomegaly. Electronically Signed   By: Maisie Fus  Register   On: 01/22/2021 06:25   ECHOCARDIOGRAM COMPLETE  Result Date: 01/21/2021    ECHOCARDIOGRAM REPORT   Patient Name:   Marco Kennedy Date of Exam: 01/21/2021 Medical Rec #:  [id number]        Height:       67.0 in Accession #:    [id number]       Weight:       269.4 lb Date of Birth:  02-21-1951        BSA:          2.295 m Patient Age:    70 years         BP:           144/73 mmHg Patient Gender: M                HR:           109 bpm. Exam Location:  Inpatient Procedure: 2D Echo, Color Doppler, Cardiac Doppler and Intracardiac            Opacification Agent Indications:    Endocarditis  History:        Patient has no prior history of Echocardiogram examinations.  Sonographer:    Eulah Pont RDCS Referring Phys: [phone] Torrell Krutz FELIZ ORTIZ IMPRESSIONS  1. Left ventricular ejection fraction, by estimation, is 65 to 70%. The left ventricle has normal function. The left ventricle has no regional wall motion  abnormalities. There is mild left ventricular hypertrophy. Left ventricular diastolic parameters were normal.  2. Right ventricular systolic function is normal. The right ventricular size is normal. Tricuspid regurgitation signal is inadequate for assessing PA pressure.  3. The mitral valve is normal in structure. No evidence of mitral valve regurgitation.  4. The aortic valve was not well visualized. Aortic valve regurgitation is not visualized. No aortic stenosis is present.  5. The inferior vena cava is normal in size with greater than 50% respiratory variability, suggesting right atrial pressure of 3 mmHg. Conclusion(s)/Recommendation(s): No vegetation seen. If high clinical suspicion for endocarditis, consider  TEE. FINDINGS  Left Ventricle: Left ventricular ejection fraction, by estimation, is 65 to 70%. The left ventricle has normal function. The left ventricle has no regional wall motion abnormalities. Definity contrast agent was given IV to delineate the left ventricular  endocardial borders. The left ventricular internal cavity size was normal in size. There is mild left ventricular hypertrophy. Left ventricular diastolic parameters were normal. Right Ventricle: The right ventricular size is normal. No increase in right ventricular wall thickness. Right ventricular systolic function is normal. Tricuspid regurgitation signal is inadequate for assessing PA pressure. Left Atrium: Left atrial size was normal in size. Right Atrium: Right atrial size was normal in size. Pericardium: There is no evidence of pericardial effusion. Mitral Valve: The mitral valve is normal in structure. No evidence of mitral valve regurgitation. Tricuspid Valve: The tricuspid valve is normal in structure. Tricuspid valve regurgitation is trivial. Aortic Valve: The aortic valve was not well visualized. Aortic valve regurgitation is not visualized. No aortic stenosis is present. Pulmonic Valve: The pulmonic valve was not well  visualized. Pulmonic valve regurgitation is not visualized. Aorta: The aortic root and ascending aorta are structurally normal, with no evidence of dilitation. Venous: The inferior vena cava is normal in size with greater than 50% respiratory variability, suggesting right atrial pressure of 3 mmHg. IAS/Shunts: The interatrial septum was not well visualized.  LEFT VENTRICLE PLAX 2D LVIDd:         4.80 cm  Diastology LVIDs:         2.80 cm  LV e' medial:    8.58 cm/s LV PW:         1.30 cm  LV E/e' medial:  8.6 LV IVS:        1.20 cm  LV e' lateral:   10.40 cm/s LVOT diam:     2.30 cm  LV E/e' lateral: 7.1 LV SV:         73 LV SV Index:   32 LVOT Area:     4.15 cm  RIGHT VENTRICLE RV S prime:     14.50 cm/s TAPSE (M-mode): 2.3 cm LEFT ATRIUM             Index       RIGHT ATRIUM           Index LA diam:        4.00 cm 1.74 cm/m  RA Area:     15.30 cm LA Vol (A2C):   43.1 ml 18.78 ml/m RA Volume:   31.10 ml  13.55 ml/m LA Vol (A4C):   38.6 ml 16.82 ml/m LA Biplane Vol: 42.9 ml 18.69 ml/m  AORTIC VALVE LVOT Vmax:   130.50 cm/s LVOT Vmean:  86.650 cm/s LVOT VTI:    0.175 m  AORTA Ao Root diam: 3.40 cm Ao Asc diam:  3.70 cm MITRAL VALVE MV Area (PHT): 4.44 cm     SHUNTS MV Decel Time: 171 msec     Systemic VTI:  0.18 m MV E velocity: 73.50 cm/s   Systemic Diam: 2.30 cm MV A velocity: 125.00 cm/s MV E/A ratio:  0.59 Epifanio Lesches MD Electronically signed by Epifanio Lesches MD Signature Date/Time: 01/21/2021/3:27:40 PM    Final      Medical Consultants:   None.   Subjective:    KAREN KINNARD pain is controlled tolerating his diet.  Objective:    Vitals:   01/21/21 2308 01/22/21 0340 01/22/21 0349 01/22/21 0757  BP: (!) 143/61  (!) 149/70 (!) 163/73  Pulse: 94  88 92  Resp: 20  18 (!) 22  Temp: 100 F (37.8 C)  98.9 F (37.2 C) 98.4 F (36.9 C)  TempSrc: Oral  Oral Oral  SpO2: 96%  99% 90%  Weight:  131.5 kg     SpO2: 90 % O2 Flow Rate (L/min): 3 L/min   Intake/Output  Summary (Last 24 hours) at 01/22/2021 0813 Last data filed at 01/22/2021 0622 Gross per 24 hour  Intake 1240.07 ml  Output 1660 ml  Net -419.93 ml    Filed Weights   01/22/21 0340  Weight: 131.5 kg    Exam: General exam: In no acute distress. Respiratory system: Good air movement and clear to auscultation, bilateral chest tube in place. Cardiovascular system: S1 & S2 heard, RRR. No JVD. Gastrointestinal system: Abdomen is nondistended, soft and nontender.  Extremities: No pedal edema. Skin: No rashes, lesions or ulcers Psychiatry: Judgement and insight appear normal. Mood & affect appropriate.   Data Reviewed:    Labs: Basic Metabolic Panel: Recent Labs  Lab 01/20/21 2039 01/21/21 0025  NA 134* 134*  K 4.2 4.1  CL 99 98  CO2 28 29  GLUCOSE 162* 152*  BUN 20 18  CREATININE 0.81 0.86  CALCIUM 7.9* 7.6*    GFR Estimated Creatinine Clearance: 104.3 mL/min (by C-G formula based on SCr of 0.86 mg/dL). Liver Function Tests: Recent Labs  Lab 01/20/21 2039  AST 37  ALT 13  ALKPHOS 92  BILITOT 0.5  PROT 5.1*  ALBUMIN 1.7*    No results for input(s): LIPASE, AMYLASE in the last 168 hours. No results for input(s): AMMONIA in the last 168 hours. Coagulation profile No results for input(s): INR, PROTIME in the last 168 hours. COVID-19 Labs  Recent Labs    01/20/21 2039  CRP 35.8*     Lab Results  Component Value Date   SARSCOV2NAA NEGATIVE 01/13/2021   SARSCOV2NAA NEGATIVE 05/07/2020   SARSCOV2NAA NEGATIVE 04/15/2020    CBC: Recent Labs  Lab 01/20/21 2039 01/21/21 0025  WBC 29.0* 23.2*  NEUTROABS 26.2*  --   HGB 8.9* 9.7*  HCT 28.4* 30.3*  MCV 88.5 88.1  PLT 594* 475*    Cardiac Enzymes: No results for input(s): CKTOTAL, CKMB, CKMBINDEX, TROPONINI in the last 168 hours. BNP (last 3 results) No results for input(s): PROBNP in the last 8760 hours. CBG: No results for input(s): GLUCAP in the last 168 hours. D-Dimer: No results for input(s):  DDIMER in the last 72 hours. Hgb A1c: No results for input(s): HGBA1C in the last 72 hours. Lipid Profile: No results for input(s): CHOL, HDL, LDLCALC, TRIG, CHOLHDL, LDLDIRECT in the last 72 hours. Thyroid function studies: No results for input(s): TSH, T4TOTAL, T3FREE, THYROIDAB in the last 72 hours.  Invalid input(s): FREET3 Anemia work up: No results for input(s): VITAMINB12, FOLATE, FERRITIN, TIBC, IRON, RETICCTPCT in the last 72 hours. Sepsis Labs: Recent Labs  Lab 01/20/21 2039 01/20/21 2050 01/21/21 0025  PROCALCITON 1.01  --   --   WBC 29.0*  --  23.2*  LATICACIDVEN  --  1.3 0.8    Microbiology Recent Results (from the past 240 hour(s))  SARS Coronavirus 2 by RT PCR (hospital order, performed in [facility] hospital lab) Nasopharyngeal Nasopharyngeal Swab     Status: None   Collection Time: 01/13/21  7:53 AM   Specimen: Nasopharyngeal Swab  Result Value Ref Range Status   SARS Coronavirus 2 NEGATIVE NEGATIVE Final    Comment: (NOTE) SARS-CoV-2 target  nucleic acids are NOT DETECTED.  The SARS-CoV-2 RNA is generally detectable in upper and lower respiratory specimens during the acute phase of infection. The lowest concentration of SARS-CoV-2 viral copies this assay can detect is 250 copies / mL. A negative result does not preclude SARS-CoV-2 infection and should not be used as the sole basis for treatment or other patient management decisions.  A negative result may occur with improper specimen collection / handling, submission of specimen other than nasopharyngeal swab, presence of viral mutation(s) within the areas targeted by this assay, and inadequate number of viral copies (<250 copies / mL). A negative result must be combined with clinical observations, patient history, and epidemiological information.  Fact Sheet for Patients:   BoilerBrush.com.cyhttps://www.fda.gov/media/136312/download  Fact Sheet for Healthcare  Providers: https://pope.com/https://www.fda.gov/media/136313/download  This test is not yet approved or  cleared by the [place] FDA and has been authorized for detection and/or diagnosis of SARS-CoV-2 by FDA under an Emergency Use Authorization (EUA).  This EUA will remain in effect (meaning this test can be used) for the duration of the COVID-19 declaration under Section 564(b)(1) of the Act, 21 U.S.C. section 360bbb-3(b)(1), unless the authorization is terminated or revoked sooner.  Performed at [facility], [address] [place], [place] [address]   Aerobic/Anaerobic Culture w Gram Stain (surgical/deep wound)     Status: None   Collection Time: 01/13/21  9:48 AM   Specimen: PATH Other; Tissue  Result Value Ref Range Status   Specimen Description WOUND  Final   Special Requests LEFT THORACOLUMBAR SPEC A  Final   Gram Stain NO WBC SEEN NO ORGANISMS SEEN   Final   Culture   Final    RARE ESCHERICHIA COLI SUSCEPTIBILITIES PERFORMED ON PREVIOUS CULTURE WITHIN THE LAST 5 DAYS. NO ANAEROBES ISOLATED Performed at [facility], [address] [place], [place] [address]    Report Status 01/18/2021 FINAL  Final  Aerobic/Anaerobic Culture w Gram Stain (surgical/deep wound)     Status: None   Collection Time: 01/13/21  9:55 AM   Specimen: PATH Other; Tissue  Result Value Ref Range Status   Specimen Description TISSUE  Final   Special Requests LEFT THORACOLUMBAR  Final   Gram Stain   Final    RARE WBC PRESENT, PREDOMINANTLY MONONUCLEAR NO ORGANISMS SEEN    Culture   Final    RARE ESCHERICHIA COLI SUSCEPTIBILITIES PERFORMED ON PREVIOUS CULTURE WITHIN THE LAST 5 DAYS. NO ANAEROBES ISOLATED Performed at [facility], [address] [place], [place] [address]    Report Status 01/18/2021 FINAL  Final  Aerobic/Anaerobic Culture w Gram Stain (surgical/deep wound)     Status: None   Collection Time: 01/13/21  9:57 AM   Specimen: PATH Other; Tissue  Result Value Ref Range  Status   Specimen Description WOUND  Final   Special Requests LEFT THORACOLUMBAR SPEC C  Final   Gram Stain NO WBC SEEN NO ORGANISMS SEEN   Final   Culture   Final    RARE ESCHERICHIA COLI SUSCEPTIBILITIES PERFORMED ON PREVIOUS CULTURE WITHIN THE LAST 5 DAYS. NO ANAEROBES ISOLATED Performed at [facility], [address] [place], [place] [address]    Report Status 01/18/2021 FINAL  Final  Aerobic/Anaerobic Culture w Gram Stain (surgical/deep wound)     Status: None   Collection Time: 01/13/21  9:58 AM   Specimen: PATH Other; Tissue  Result Value Ref Range Status   Specimen Description WOUND  Final  Special Requests LEFT THORACOLUMBAR SPEC B  Final   Gram Stain NO WBC SEEN NO ORGANISMS SEEN   Final   Culture   Final    RARE ESCHERICHIA COLI SUSCEPTIBILITIES PERFORMED ON PREVIOUS CULTURE WITHIN THE LAST 5 DAYS. NO ANAEROBES ISOLATED Performed at [facility], [address], [place], [place] [address]    Report Status 01/18/2021 FINAL  Final  MRSA Next Gen by PCR, Nasal     Status: None   Collection Time: 01/20/21  5:07 PM   Specimen: Nasal Mucosa; Nasal Swab  Result Value Ref Range Status   MRSA by PCR Next Gen NOT DETECTED NOT DETECTED Final    Comment: (NOTE) The GeneXpert MRSA Assay (FDA approved for NASAL specimens only), is one component of a comprehensive MRSA colonization surveillance program. It is not intended to diagnose MRSA infection nor to guide or monitor treatment for MRSA infections. Test performance is not FDA approved in patients less than 53 years old. Performed at [facility], [address], [place], [place] [address]   Culture, body fluid w Gram Stain-bottle     Status: None (Preliminary result)   Collection Time: 01/20/21  6:30 PM   Specimen: Fluid  Result Value Ref Range Status   Specimen Description FLUID LEFT PLEURAL  Final   Special Requests   Final    BOTTLES DRAWN AEROBIC AND ANAEROBIC Blood Culture adequate volume    Culture   Final    NO GROWTH < 12 HOURS Performed at [facility], [address] [place], [place] [address]    Report Status PENDING  Incomplete  Gram stain     Status: None   Collection Time: 01/20/21  6:30 PM   Specimen: Fluid  Result Value Ref Range Status   Specimen Description FLUID LEFT PLEURAL  Final   Special Requests NONE  Final   Gram Stain   Final    MODERATE WBC PRESENT, PREDOMINANTLY PMN NO ORGANISMS SEEN Performed at [facility], [address], [place], [place] [address]    Report Status 01/21/2021 FINAL  Final  Culture, body fluid w Gram Stain-bottle     Status: None (Preliminary result)   Collection Time: 01/20/21  6:30 PM   Specimen: Fluid  Result Value Ref Range Status   Specimen Description FLUID RIGHT PLEURAL  Final   Special Requests   Final    BOTTLES DRAWN AEROBIC AND ANAEROBIC Blood Culture adequate volume   Culture   Final    NO GROWTH < 12 HOURS Performed at [facility], [address], [place], [place] [address]    Report Status PENDING  Incomplete  Gram stain     Status: None   Collection Time: 01/20/21  6:30 PM   Specimen: Fluid  Result Value Ref Range Status   Specimen Description FLUID RIGHT PLEURAL  Final   Special Requests NONE  Final   Gram Stain   Final    ABUNDANT WBC PRESENT, PREDOMINANTLY PMN NO ORGANISMS SEEN Performed at [facility], [address] [place], [place] [address]    Report Status 01/21/2021 FINAL  Final  Culture, blood (routine x 2)     Status: None (Preliminary result)   Collection Time: 01/20/21  8:39 PM   Specimen: BLOOD LEFT ARM  Result Value Ref Range Status   Specimen Description BLOOD LEFT ARM  Final   Special Requests   Final    BOTTLES DRAWN AEROBIC AND ANAEROBIC  Blood Culture adequate volume   Culture   Final    NO GROWTH < 24 HOURS Performed at [facility], [address] [place], [place] [address]    Report Status PENDING  Incomplete  Culture, blood (routine x 2)      Status: None (Preliminary result)   Collection Time: 01/20/21  8:39 PM   Specimen: BLOOD LEFT HAND  Result Value Ref Range Status   Specimen Description BLOOD LEFT HAND  Final   Special Requests   Final    BOTTLES DRAWN AEROBIC AND ANAEROBIC Blood Culture adequate volume   Culture   Final    NO GROWTH < 24 HOURS Performed at [facility], [address] [place], [place] [address]    Report Status PENDING  Incomplete     Medications:    Chlorhexidine Gluconate Cloth  6 each Topical Daily   docusate sodium  100 mg Oral BID   enoxaparin (LOVENOX) injection  40 mg Subcutaneous Daily   feeding supplement  237 mL Oral TID BM   multivitamin with minerals  1 tablet Oral Daily   polyethylene glycol  17 g Oral BID   sodium chloride flush  3 mL Intravenous Q12H   Continuous Infusions:  ceFEPime (MAXIPIME) IV Stopped (01/22/21 0412)   vancomycin Stopped (01/21/21 2314)      LOS: 2 days   Marinda Elk  [facility]  01/22/2021, 8:13 AM

## 2021-01-22 NOTE — Progress Notes (Signed)
NAME:  Marco Kennedy, MRN:  [id number], DOB:  08/20/1950, LOS: 2 ADMISSION DATE:  01/20/2021, CONSULTATION DATE:  01/20/21 REFERRING MD:  Ophelia Charter, CHIEF COMPLAINT:  SOB   History of Present Illness:  70 year old man with hx of OSA on CPAP, recent lumbar hardware infection, epidural abscess who was Dc'd on prolonged IV antibiotics on 7/16 presenting with progressive SOB, chest pain, fevers, chills, and shoulder discomfort.  Imaging at [facility] ED revealed continued infection in thoracolumbar areas as well as new likely spread to pleura.  PCCM consulted to help manage pleural infection.  Pertinent  Medical History  OSA Pelvic hardware infection detailed in DC summary 7/22  Significant Hospital Events: Including procedures, antibiotic start and stop dates in addition to other pertinent events   7/26 admitted from [facility] ED 7/27 changed to water seal. ID seeing, NSGY seeing 7/28 improved pleuritic pain, continues on water seal.  Still awaiting pleural cx   Interim History / Subjective:   Improved pleuritic pain on water seal.   Pulling 350-400 on IS   Objective   7/28: there are no labs for review today. CXR with small pleural effusions, stable chest tube position. 7/27s labs show down trend in WBC from 29 to 23. We are still awaiting pleural cx data   Examination: General: older adult M reclined in bed NAD  HENT: NCAT pink mm anicteric sclera  Lungs: symmetrical chest expansion, diminished bibasilar sounds. Bilateral chest tubes to water seal, no air leak  Cardiovascular: rrr cap refill brisk  Abdomen: soft round ndnt  Extremities: no acute joint deformity  Neuro: AAOx4 following commands Skin: c/d/w     Resolved Hospital Problem list     Assessment & Plan:    Acute hypoxemic respiratory failure Bilateral pleural effusions, suspected spread from thoracolumbar-pelvic area infection  P -s/p bilat pigtails 7/26 -continue to water seal (pt did not tolerate suction --  significant pain) -trend output and CXR  -follow pleural fluid data -abx per ID -PRN toradol for pleuritic pain   Best Practice (right click and "Reselect all SmartList Selections" daily)  Per primary  Labs   CBC: Recent Labs  Lab 01/20/21 2039 01/21/21 0025  WBC 29.0* 23.2*  NEUTROABS 26.2*  --   HGB 8.9* 9.7*  HCT 28.4* 30.3*  MCV 88.5 88.1  PLT 594* 475*    Basic Metabolic Panel: Recent Labs  Lab 01/20/21 2039 01/21/21 0025  NA 134* 134*  K 4.2 4.1  CL 99 98  CO2 28 29  GLUCOSE 162* 152*  BUN 20 18  CREATININE 0.81 0.86  CALCIUM 7.9* 7.6*   GFR: Estimated Creatinine Clearance: 104.3 mL/min (by C-G formula based on SCr of 0.86 mg/dL). Recent Labs  Lab 01/20/21 2039 01/20/21 2050 01/21/21 0025  PROCALCITON 1.01  --   --   WBC 29.0*  --  23.2*  LATICACIDVEN  --  1.3 0.8    Liver Function Tests: Recent Labs  Lab 01/20/21 2039  AST 37  ALT 13  ALKPHOS 92  BILITOT 0.5  PROT 5.1*  ALBUMIN 1.7*   No results for input(s): LIPASE, AMYLASE in the last 168 hours. No results for input(s): AMMONIA in the last 168 hours.  ABG No results found for: PHART, PCO2ART, PO2ART, HCO3, TCO2, ACIDBASEDEF, O2SAT   Coagulation Profile: No results for input(s): INR, PROTIME in the last 168 hours.  Cardiac Enzymes: No results for input(s): CKTOTAL, CKMB, CKMBINDEX, TROPONINI in the last 168 hours.  HbA1C: Hgb A1c MFr Bld  Date/Time Value Ref Range Status  01/11/2021 05:00 AM 5.9 (H) 4.8 - 5.6 % Final    Comment:    (NOTE) Pre diabetes:          5.7%-6.4%  Diabetes:              >6.4%  Glycemic control for   <7.0% adults with diabetes     CBG: No results for input(s): GLUCAP in the last 168 hours.   CCT: n/a   Tessie Fass MSN, AGACNP-BC [facility] Pulmonary/Critical Care Medicine Amion for pager 01/22/2021, 8:00 AM

## 2021-01-22 NOTE — Progress Notes (Signed)
    [facility] has been requested to perform a transesophageal echocardiogram on Marco Kennedy for bacteremia.  After careful review of history and examination, the risks and benefits of transesophageal echocardiogram have been explained including risks of esophageal damage, perforation (1:10,000 risk), bleeding, pharyngeal hematoma as well as other potential complications associated with conscious sedation including aspiration, arrhythmia, respiratory failure and death. Alternatives to treatment were discussed, questions were answered. Patient is willing to proceed.   Georgie Chard, NP  01/22/2021 11:24 AM

## 2021-01-22 NOTE — Progress Notes (Signed)
[facility]  Date of Admission:  01/20/2021     CC: Hardware associated thoracic vertebral om/epidural absess   Lines:  Right ue picc from 7/16 admission   Abx: 7/26-c vanc 7/26-c cefepime   Home iv cefazolin                                                          Assessment: 70 yo male previous thoracolumbar hardware/back surgery 03/2020 complicated by thoracic vertebral om/epidural abscess s/p recent I&D 7/19 with cx ecoli (bcx negative at that time), readmitted a few days after discharged on 7/22 for fluid overload and bilateral pleural effusion and fever, s/p bilateral chest tube placement with effusion appearing exudative     #exudative bilateral pleural effusion #fluid overload Effusion appear exudative. Cx in progress. Bcx also in progress Previous admission without effusion or respiratory sx. No hx chf Rather atypical We know he has extensive thoracic spine infection so query spill over from prevertebral area with contiguous pleural involvement (that too would be atypical given the extent of effusion) Rather soon for PICC infection and IE with hematogenous spread involving the pleural space  7/16 and 7/26 bcx ngtd As of 7/28 pleural fluid cx ngtd -- suspect again parapneumonic process contiguous extension of thoracic vertebral OM 7/27 tte no evidence endocarditis    -no need for TEE from ID standpoint -stop vancomycin today no mrsa -continue cefepime; tomorrow 7/29 if pleural fluid cx continues to be negative then can transition cefepime to cefazolin -repeat chest ct early next week -discussed with primary team/cardiology team/id pharmacy      #hardware associated thoracic vertebral om/epidural abscess S/p I&D 7/19. Cx ecoli Patient unclear if back pain is worse in setting of chest pain/fluid overload-sob Sepsis/fever while on cefazolin ?pleural process vs back process NSG team reevaluated patient --> no neurological  deficit so no planned urgent repeat imaging  -agree with NSG repeat imaging thoraco/lumbar mri need in around 3 weeks -abx plan as above    I spent more than 35 minute reviewing data/chart, and coordinating care and >50% direct face to face time providing counseling/discussing diagnostics/treatment plan with patient  Principal Problem:   Sepsis due to undetermined organism [facility]) Active Problems:   Vertebral osteomyelitis (HCC)   Hardware complicating wound infection (HCC)   Pleural effusion   Anasarca   OSA (obstructive sleep apnea)   Obesity, Class III, BMI 40-49.9 (morbid obesity) (HCC)   No Known Allergies  Scheduled Meds:  Chlorhexidine Gluconate Cloth  6 each Topical Daily   docusate sodium  100 mg Oral BID   enoxaparin (LOVENOX) injection  40 mg Subcutaneous Daily   feeding supplement  237 mL Oral TID BM   multivitamin with minerals  1 tablet Oral Daily   sodium chloride flush  3 mL Intravenous Q12H   Continuous Infusions:  ceFEPime (MAXIPIME) IV 2 g (01/22/21 1249)   vancomycin 1,000 mg (01/22/21 0935)   PRN Meds:.acetaminophen **OR** acetaminophen, dextromethorphan, ketorolac, morphine injection, ondansetron **OR** ondansetron (ZOFRAN) IV, oxyCODONE, polyethylene glycol   SUBJECTIVE: Chest pain/back pain better today Feeling also better overall Wbc down No n/v/diarrhea No f/c No rash  Consented for tee Bcx negative Pleural fluid cx negative to date  Review of Systems: ROS All other ROS was negative,  except mentioned above     OBJECTIVE: Vitals:   01/22/21 0757 01/22/21 0937 01/22/21 1133 01/22/21 1252  BP: (!) 163/73  137/64   Pulse: 92  100   Resp: (!) 22  (!) 22   Temp: 98.4 F (36.9 C) (!) 100.7 F (38.2 C) 99.6 F (37.6 C) 100.2 F (37.9 C)  TempSrc: Oral Axillary Oral Axillary  SpO2: 90%  95%   Weight:       Body mass index is 45.41 kg/m.  Physical Exam General/constitutional: no distress, pleasant HEENT: Normocephalic, PER, Conj  Clear, EOMI, Oropharynx clear Neck supple CV: rrr no mrg Lungs: clear to auscultation, normal respiratory effort; bilateral chest tube intact no airleak Abd: Soft, Nontender Ext: remains with 1-2+ bilateral LE edema Skin: No Rash Neuro: nonfocal MSK: no peripheral joint swelling/tenderness/warmth; back spines nontender   Central line presence: RUE picc site no purulence/erythema   Lab Results Lab Results  Component Value Date   WBC 23.2 (H) 01/21/2021   HGB 9.7 (L) 01/21/2021   HCT 30.3 (L) 01/21/2021   MCV 88.1 01/21/2021   PLT 475 (H) 01/21/2021    Lab Results  Component Value Date   CREATININE 0.86 01/21/2021   BUN 18 01/21/2021   NA 134 (L) 01/21/2021   K 4.1 01/21/2021   CL 98 01/21/2021   CO2 29 01/21/2021    Lab Results  Component Value Date   ALT 13 01/20/2021   AST 37 01/20/2021   ALKPHOS 92 01/20/2021   BILITOT 0.5 01/20/2021      Microbiology: Recent Results (from the past 240 hour(s))  SARS Coronavirus 2 by RT PCR (hospital order, performed in [facility] hospital lab) Nasopharyngeal Nasopharyngeal Swab     Status: None   Collection Time: 01/13/21  7:53 AM   Specimen: Nasopharyngeal Swab  Result Value Ref Range Status   SARS Coronavirus 2 NEGATIVE NEGATIVE Final    Comment: (NOTE) SARS-CoV-2 target nucleic acids are NOT DETECTED.  The SARS-CoV-2 RNA is generally detectable in upper and lower respiratory specimens during the acute phase of infection. The lowest concentration of SARS-CoV-2 viral copies this assay can detect is 250 copies / mL. A negative result does not preclude SARS-CoV-2 infection and should not be used as the sole basis for treatment or other patient management decisions.  A negative result may occur with improper specimen collection / handling, submission of specimen other than nasopharyngeal swab, presence of viral mutation(s) within the areas targeted by this assay, and inadequate number of viral copies (<250 copies / mL). A  negative result must be combined with clinical observations, patient history, and epidemiological information.  Fact Sheet for Patients:   BoilerBrush.com.cy  Fact Sheet for Healthcare Providers: https://pope.com/  This test is not yet approved or  cleared by the [place] FDA and has been authorized for detection and/or diagnosis of SARS-CoV-2 by FDA under an Emergency Use Authorization (EUA).  This EUA will remain in effect (meaning this test can be used) for the duration of the COVID-19 declaration under Section 564(b)(1) of the Act, 21 U.S.C. section 360bbb-3(b)(1), unless the authorization is terminated or revoked sooner.  Performed at [facility], [address] [place], [place] [address]   Aerobic/Anaerobic Culture w Gram Stain (surgical/deep wound)     Status: None   Collection Time: 01/13/21  9:48 AM   Specimen: PATH Other; Tissue  Result Value Ref Range Status   Specimen Description WOUND  Final   Special Requests LEFT THORACOLUMBAR SPEC  A  Final   Gram Stain NO WBC SEEN NO ORGANISMS SEEN   Final   Culture   Final    RARE ESCHERICHIA COLI SUSCEPTIBILITIES PERFORMED ON PREVIOUS CULTURE WITHIN THE LAST 5 DAYS. NO ANAEROBES ISOLATED Performed at [facility], [address], [place], [place] [address]    Report Status 01/18/2021 FINAL  Final  Aerobic/Anaerobic Culture w Gram Stain (surgical/deep wound)     Status: None   Collection Time: 01/13/21  9:55 AM   Specimen: PATH Other; Tissue  Result Value Ref Range Status   Specimen Description TISSUE  Final   Special Requests LEFT THORACOLUMBAR  Final   Gram Stain   Final    RARE WBC PRESENT, PREDOMINANTLY MONONUCLEAR NO ORGANISMS SEEN    Culture   Final    RARE ESCHERICHIA COLI SUSCEPTIBILITIES PERFORMED ON PREVIOUS CULTURE WITHIN THE LAST 5 DAYS. NO ANAEROBES ISOLATED Performed at [facility], [address], [place], [place] [address]     Report Status 01/18/2021 FINAL  Final  Aerobic/Anaerobic Culture w Gram Stain (surgical/deep wound)     Status: None   Collection Time: 01/13/21  9:57 AM   Specimen: PATH Other; Tissue  Result Value Ref Range Status   Specimen Description WOUND  Final   Special Requests LEFT THORACOLUMBAR SPEC C  Final   Gram Stain NO WBC SEEN NO ORGANISMS SEEN   Final   Culture   Final    RARE ESCHERICHIA COLI SUSCEPTIBILITIES PERFORMED ON PREVIOUS CULTURE WITHIN THE LAST 5 DAYS. NO ANAEROBES ISOLATED Performed at [facility], [address] [place], [place] [address]    Report Status 01/18/2021 FINAL  Final  Aerobic/Anaerobic Culture w Gram Stain (surgical/deep wound)     Status: None   Collection Time: 01/13/21  9:58 AM   Specimen: PATH Other; Tissue  Result Value Ref Range Status   Specimen Description WOUND  Final   Special Requests LEFT THORACOLUMBAR SPEC B  Final   Gram Stain NO WBC SEEN NO ORGANISMS SEEN   Final   Culture   Final    RARE ESCHERICHIA COLI SUSCEPTIBILITIES PERFORMED ON PREVIOUS CULTURE WITHIN THE LAST 5 DAYS. NO ANAEROBES ISOLATED Performed at [facility], [address] [place], [place] [address]    Report Status 01/18/2021 FINAL  Final  MRSA Next Gen by PCR, Nasal     Status: None   Collection Time: 01/20/21  5:07 PM   Specimen: Nasal Mucosa; Nasal Swab  Result Value Ref Range Status   MRSA by PCR Next Gen NOT DETECTED NOT DETECTED Final    Comment: (NOTE) The GeneXpert MRSA Assay (FDA approved for NASAL specimens only), is one component of a comprehensive MRSA colonization surveillance program. It is not intended to diagnose MRSA infection nor to guide or monitor treatment for MRSA infections. Test performance is not FDA approved in patients less than 27 years old. Performed at [facility], [address], [place], [place] [address]   Culture, body fluid w Gram Stain-bottle     Status: None (Preliminary result)   Collection Time:  01/20/21  6:30 PM   Specimen: Fluid  Result Value Ref Range Status   Specimen Description FLUID LEFT PLEURAL  Final   Special Requests   Final    BOTTLES DRAWN AEROBIC AND ANAEROBIC Blood Culture adequate volume   Culture   Final    NO GROWTH 2 DAYS Performed at [facility], [address], [place], [place] [address]    Report Status PENDING  Incomplete  Gram stain     Status: None   Collection Time: 01/20/21  6:30 PM   Specimen: Fluid  Result Value Ref Range Status   Specimen Description FLUID LEFT PLEURAL  Final   Special Requests NONE  Final   Gram Stain   Final    MODERATE WBC PRESENT, PREDOMINANTLY PMN NO ORGANISMS SEEN Performed at [facility], [address], [place], [place] [address]    Report Status 01/21/2021 FINAL  Final  Culture, body fluid w Gram Stain-bottle     Status: None (Preliminary result)   Collection Time: 01/20/21  6:30 PM   Specimen: Fluid  Result Value Ref Range Status   Specimen Description FLUID RIGHT PLEURAL  Final   Special Requests   Final    BOTTLES DRAWN AEROBIC AND ANAEROBIC Blood Culture adequate volume   Culture   Final    NO GROWTH 2 DAYS Performed at [facility], [address], [place], [place] [address]    Report Status PENDING  Incomplete  Gram stain     Status: None   Collection Time: 01/20/21  6:30 PM   Specimen: Fluid  Result Value Ref Range Status   Specimen Description FLUID RIGHT PLEURAL  Final   Special Requests NONE  Final   Gram Stain   Final    ABUNDANT WBC PRESENT, PREDOMINANTLY PMN NO ORGANISMS SEEN Performed at [facility], [address] [place], [place] [address]    Report Status 01/21/2021 FINAL  Final  Culture, blood (routine x 2)     Status: None (Preliminary result)   Collection Time: 01/20/21  8:39 PM   Specimen: BLOOD LEFT ARM  Result Value Ref Range Status   Specimen Description BLOOD LEFT ARM  Final   Special Requests   Final    BOTTLES DRAWN AEROBIC AND ANAEROBIC Blood  Culture adequate volume   Culture   Final    NO GROWTH 2 DAYS Performed at [facility], [address] [place], [place] [address]    Report Status PENDING  Incomplete  Culture, blood (routine x 2)     Status: None (Preliminary result)   Collection Time: 01/20/21  8:39 PM   Specimen: BLOOD LEFT HAND  Result Value Ref Range Status   Specimen Description BLOOD LEFT HAND  Final   Special Requests   Final    BOTTLES DRAWN AEROBIC AND ANAEROBIC Blood Culture adequate volume   Culture   Final    NO GROWTH 2 DAYS Performed at [facility], [address] [place], [place] [address]    Report Status PENDING  Incomplete  Urine Culture     Status: None   Collection Time: 01/21/21  7:31 AM   Specimen: Urine, Clean Catch  Result Value Ref Range Status   Specimen Description URINE, CLEAN CATCH  Final   Special Requests NONE  Final   Culture   Final    NO GROWTH Performed at [facility], [address] [place], [place] [address]    Report Status 01/22/2021 FINAL  Final     Serology:   Imaging: If present, new imagings (plain films, ct scans, and mri) have been personally visualized and interpreted; radiology reports have been reviewed. Decision making incorporated into the Impression / Recommendations.  7/27 tte  1. Left ventricular ejection fraction, by estimation, is 65 to 70%. The  left ventricle  has normal function. The left ventricle has no regional  wall motion abnormalities. There is mild left ventricular hypertrophy.  Left ventricular diastolic parameters  were normal.   2. Right ventricular systolic function is normal. The right ventricular  size is normal. Tricuspid regurgitation signal is inadequate for assessing  PA pressure.   3. The mitral valve is normal in structure. No evidence of mitral valve  regurgitation.   4. The aortic valve was not well visualized. Aortic valve regurgitation  is not visualized. No aortic stenosis is present.   5. The  inferior vena cava is normal in size with greater than 50%  respiratory variability, suggesting right atrial pressure of 3 mmHg.   Conclusion(s)/Recommendation(s): No vegetation seen. If high clinical  suspicion for endocarditis, consider TEE.    7/28 cxr 1.  Bilateral chest tubes in stable position.  No pneumothorax.   2. Persistent bibasilar atelectasis/infiltrates and small bilateral pleural effusions.   3.  Stable cardiomegaly  Raymondo Bandrung T Stephinie Battisti, MD [facility] [phone] pager    01/22/2021, 3:13 PM

## 2021-01-23 ENCOUNTER — Inpatient Hospital Stay (HOSPITAL_COMMUNITY): Payer: BC Managed Care – PPO

## 2021-01-23 ENCOUNTER — Encounter (HOSPITAL_COMMUNITY)
Admission: EM | Disposition: A | Discharge status: Rehabilitation Facility | Payer: Self-pay | Source: Other Acute Inpatient Hospital | Attending: Internal Medicine

## 2021-01-23 DIAGNOSIS — J90 Pleural effusion, not elsewhere classified: Secondary | ICD-10-CM | POA: Diagnosis not present

## 2021-01-23 DIAGNOSIS — T847XXD Infection and inflammatory reaction due to other internal orthopedic prosthetic devices, implants and grafts, subsequent encounter: Secondary | ICD-10-CM | POA: Diagnosis not present

## 2021-01-23 DIAGNOSIS — R601 Generalized edema: Secondary | ICD-10-CM | POA: Diagnosis not present

## 2021-01-23 DIAGNOSIS — A419 Sepsis, unspecified organism: Secondary | ICD-10-CM | POA: Diagnosis not present

## 2021-01-23 DIAGNOSIS — G4733 Obstructive sleep apnea (adult) (pediatric): Secondary | ICD-10-CM

## 2021-01-23 DIAGNOSIS — J918 Pleural effusion in other conditions classified elsewhere: Secondary | ICD-10-CM

## 2021-01-23 DIAGNOSIS — J189 Pneumonia, unspecified organism: Secondary | ICD-10-CM

## 2021-01-23 DIAGNOSIS — M4620 Osteomyelitis of vertebra, site unspecified: Secondary | ICD-10-CM | POA: Diagnosis not present

## 2021-01-23 LAB — GLUCOSE, PLEURAL OR PERITONEAL FLUID: Glucose, Fluid: 42 mg/dL

## 2021-01-23 IMAGING — DX DG CHEST 1V PORT
1 series · 1 of 1 positions shown · non-contrast
Comparison: [DATE]

CLINICAL DATA: Pleural effusion.

EXAM:
PORTABLE CHEST 1 VIEW

[chest]
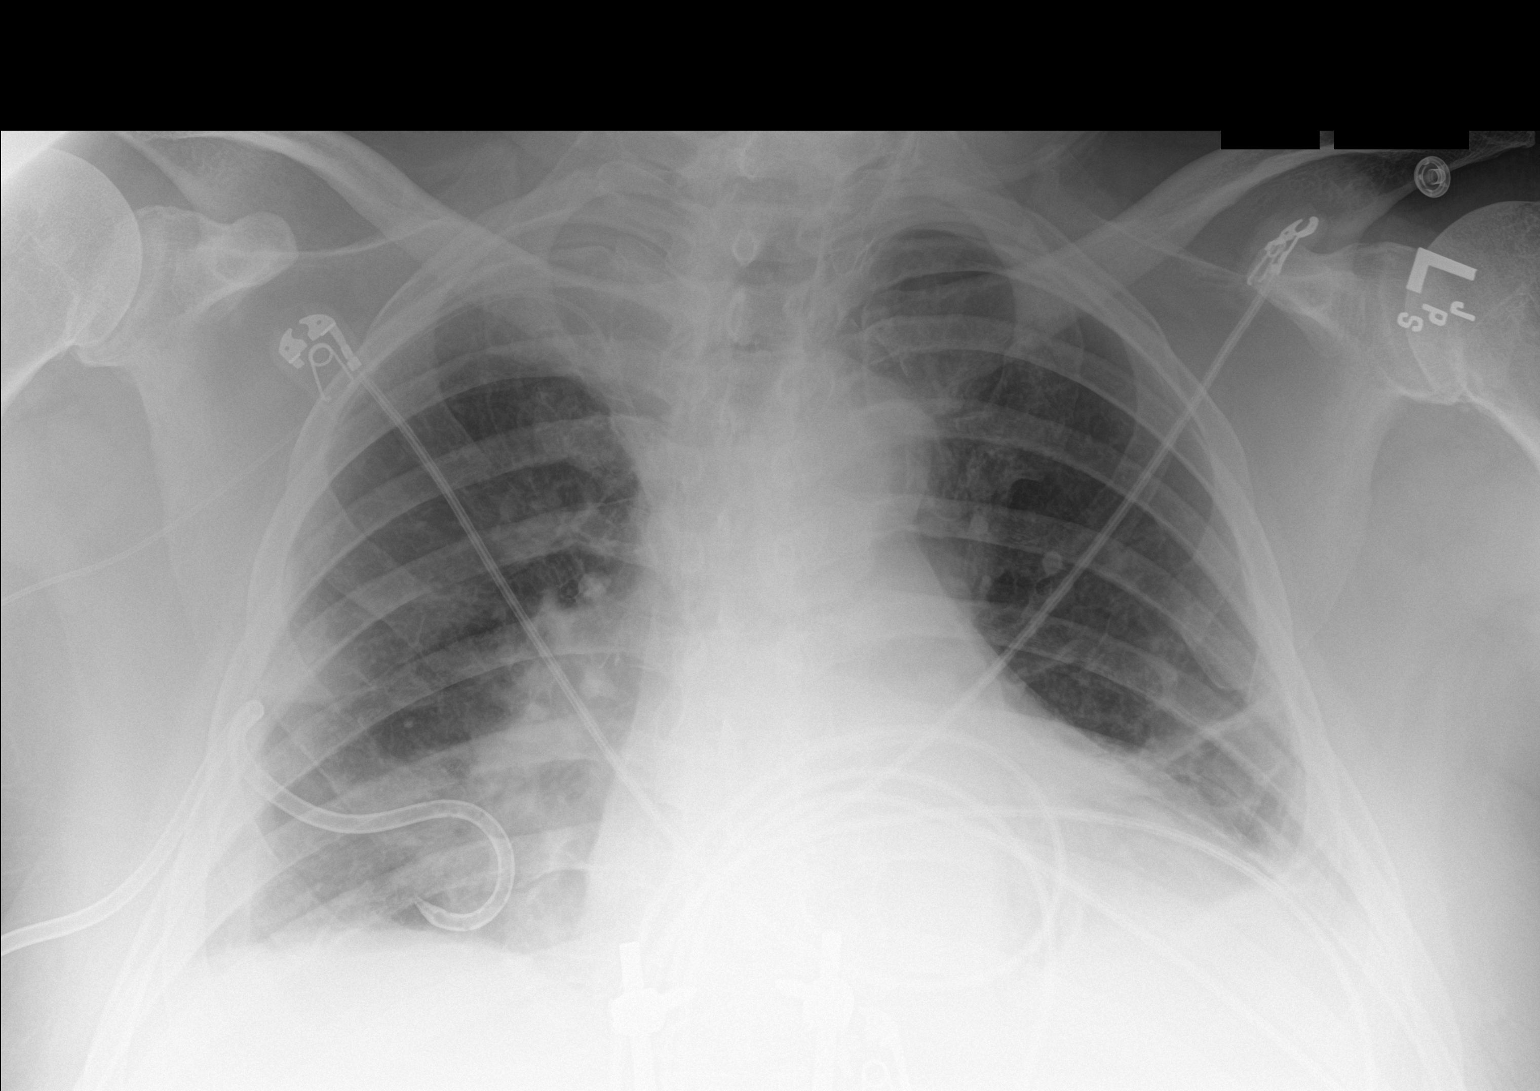

[1 of 1 positions shown; findings below may reference images not displayed]

FINDINGS: A right chest tube is unchanged. The left chest tube has been
removed. A right PICC remains in place. The cardiac silhouette
remains mildly enlarged. Bibasilar opacities are unchanged and
likely represent a combination of small pleural effusions and
atelectasis. No pneumothorax is identified.
IMPRESSION: Interval left chest tube removal. Unchanged small pleural effusions
and bibasilar atelectasis. No pneumothorax.

## 2021-01-23 SURGERY — ECHOCARDIOGRAM, TRANSESOPHAGEAL
Anesthesia: Monitor Anesthesia Care

## 2021-01-23 MED ORDER — LACTATED RINGERS IV BOLUS
1000.0000 mL | Freq: Once | INTRAVENOUS | Status: AC
  Administered 2021-01-23: 1000 mL via INTRAVENOUS

## 2021-01-23 MED ORDER — SODIUM CHLORIDE 0.9% FLUSH: 10.0000 mL | INTRAVENOUS | Status: DC | PRN

## 2021-01-23 MED ORDER — SODIUM CHLORIDE 0.9% FLUSH
10.0000 mL | Freq: Two times a day (BID) | INTRAVENOUS | Status: DC
  Administered 2021-01-23: 20 mL
  Administered 2021-01-23 – 2021-01-29 (×10): 10 mL
  Administered 2021-01-30: 40 mL
  Administered 2021-01-30: 20 mL
  Administered 2021-01-31: 10 mL
  Administered 2021-01-31 – 2021-02-01 (×2): 40 mL
  Administered 2021-02-01 – 2021-02-03 (×4): 10 mL

## 2021-01-23 NOTE — Evaluation (Addendum)
Physical Therapy Evaluation Patient Details Name: Marco Kennedy MRN: [id number] DOB: [birth year] Today's Date: 01/23/2021   History of Present Illness  Pt is a 70 y.o. male who presented 01/20/21 with SOB, increased weight gain, back pain, and fever. Of note, recent admission from 7/16-22 for sepsis secondary to moderate-sized abscess near the pelvic hardware. CT of the chest show empyema as well as discitis osteomyelitis of T10. S/p bil chest tube placement 7/26, L chest tube removed 7/29. PMH: back surgery with pelvis fixation in 03/2020, OSA on BIPAP, arthritis   Clinical Impression  Pt presents with condition above and deficits mentioned below, see PT Problem List. PTA, he was working as an Airline pilot, living with his wife on the main floor of the house with 14 STE, and functioning mod I with all mobility, no AD for distances up to 50-100 ft, cane for longer distances, and RW on uneven surfaces. His wife assists him in dressing and bathing his bottom half. Currently, the pt is primarily limited by pain and fatigue. He is requiring modAx2 to sit up with HOB elevated and minAx2 to perform transfers and take a few lateral steps this date. He displays deficits in overall strength, coordination, balance, and activity tolerance, placing him at risk for falls. Expect pt will progress well as his pain decreases, thus recommending HHPT. Will continue to follow acutely.   Follow Up Recommendations Home health PT;Supervision for mobility/OOB    Equipment Recommendations  None recommended by PT    Recommendations for Other Services       Precautions / Restrictions Precautions Precautions: Fall Precaution Comments: R chest tube; monitor SpO2 Restrictions Weight Bearing Restrictions: No      Mobility  Bed Mobility Overal bed mobility: Needs Assistance Bed Mobility: Rolling;Sidelying to Sit Rolling: Min assist Sidelying to sit: Mod assist;+2 for physical assistance;HOB elevated        General bed mobility comments: Cues to roll with guidance of hand to rail and assistance to lift legs into flexion. ModAx2 to manage legs off EOB and ascend trunk.    Transfers Overall transfer level: Needs assistance Equipment used: Rolling walker (2 wheeled) Transfers: Sit to/from [employer] Sit to Stand: Min assist;+2 physical assistance;From elevated surface;+2 safety/equipment Stand pivot transfers: Min assist;+2 physical assistance;From elevated surface;+2 safety/equipment       General transfer comment: Sit to stand 2x from elevated EOB to RW, needing minAx2 for safety and to power up to full upright posture. MinAx2 to stand step to L with RW, displaying increased trunk flexion with time.  Ambulation/Gait Ambulation/Gait assistance: Min assist;+2 physical assistance;+2 safety/equipment Gait Distance (Feet): 2 Feet Assistive device: Rolling walker (2 wheeled) Gait Pattern/deviations: Step-through pattern;Decreased stride length;Trunk flexed;Shuffle Gait velocity: reduced Gait velocity interpretation: <1.31 ft/sec, indicative of household ambulator General Gait Details: Pt taking short, shuffling steps slowly towards R with RW and miNAx2, displaying increased trunk flexion as he fatigued.  Stairs            Wheelchair Mobility    Modified Rankin (Stroke Patients Only)       Balance Overall balance assessment: Needs assistance Sitting-balance support: Bilateral upper extremity supported;Feet supported Sitting balance-Leahy Scale: Poor Sitting balance - Comments: Reliant on UE support, min guard.   Standing balance support: Bilateral upper extremity supported;During functional activity Standing balance-Leahy Scale: Poor Standing balance comment: Reliant on UE support on RW and physical assistance.  Pertinent Vitals/Pain Pain Assessment: 0-10 Pain Score: 7  (5 with standing) Pain Location: lower back Pain  Descriptors / Indicators: Discomfort;Grimacing;Guarding;Other (Comment);Sharp ("intense") Pain Intervention(s): Limited activity within patient's tolerance;Monitored during session;Repositioned;Patient requesting pain meds-RN notified    Home Living Family/patient expects to be discharged to:: Private residence Living Arrangements: Spouse/significant other Available Help at Discharge: Family;Available 24 hours/day Type of Home: House Home Access: Stairs to enter Entrance Stairs-Rails: Right;Can reach both (R rails first section with wall on L, can reach both on top section) Entrance Stairs-Number of Steps: approximately 14 total (6-7 steps followed by a walkway then 6-7 additional steps) Home Layout: Two level;Able to live on main level with bedroom/bathroom Home Equipment: Shower seat;Bedside commode;Walker - 2 wheels;Cane - single point;Walker - 4 wheels;Other (comment);Toilet riser (tub bench) Additional Comments: 1 step up to enter main level if wife drives pt around to back door driving through neighbor's yard, which they do occasionally    Prior Function Level of Independence: Needs assistance   Gait / Transfers Assistance Needed: Was able to walk 50-100 ft without AD, used cane for longer distances, but uses RW on uneven surfaces. mod I with all mobility, including stairs.  ADL's / Homemaking Assistance Needed: Wife assists in dressing and bathing bottom half. Wife does cooking and cleaning. Pt manages own meds and finances.  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     Hand Dominance   Dominant Hand: Right    Extremity/Trunk Assessment   Upper Extremity Assessment Upper Extremity Assessment: Defer to OT evaluation    Lower Extremity Assessment Lower Extremity Assessment: Generalized weakness (denied numbness/tingling throughout; reports L leg has been more weak than R since surgery in October 2021)    Cervical / Trunk Assessment Cervical / Trunk Assessment: Kyphotic   Communication   Communication: No difficulties  Cognition Arousal/Alertness: Awake/alert Behavior During Therapy: WFL for tasks assessed/performed Overall Cognitive Status: Within Functional Limits for tasks assessed                                 General Comments: Pt accurately responded to commands and able to problem-solve how to perform tasks.      General Comments General comments (skin integrity, edema, etc.): RN assisted PT throughout session    Exercises     Assessment/Plan    PT Assessment Patient needs continued PT services  PT Problem List Decreased strength;Decreased mobility;Decreased activity tolerance;Decreased range of motion;Decreased balance;Decreased coordination;Cardiopulmonary status limiting activity;Pain       PT Treatment Interventions Gait training;Stair training;Functional mobility training;Therapeutic activities;DME instruction;Balance training;Therapeutic exercise;Neuromuscular re-education;Patient/family education    PT Goals (Current goals can be found in the Care Plan section)  Acute Rehab PT Goals Patient Stated Goal: Return home PT Goal Formulation: With patient Time For Goal Achievement: 02/06/21 Potential to Achieve Goals: Good    Frequency Min 4X/week   Barriers to discharge        Co-evaluation               AM-PAC PT "6 Clicks" Mobility  Outcome Measure Help needed turning from your back to your side while in a flat bed without using bedrails?: A Little Help needed moving from lying on your back to sitting on the side of a flat bed without using bedrails?: Total Help needed moving to and from a bed to a chair (including a wheelchair)?: Total Help needed standing up from a chair using your arms (e.g., wheelchair or  bedside chair)?: Total Help needed to walk in hospital room?: Total Help needed climbing 3-5 steps with a railing? : Total 6 Click Score: 8    End of Session Equipment Utilized During Treatment:  Gait belt;Oxygen Activity Tolerance: Patient limited by fatigue;Patient limited by pain Patient left: in chair;with call bell/phone within reach;with chair alarm set;with nursing/sitter in room Nurse Communication: Mobility status;Patient requests pain meds PT Visit Diagnosis: Muscle weakness (generalized) (M62.81);Other abnormalities of gait and mobility (R26.89);Unsteadiness on feet (R26.81);Difficulty in walking, not elsewhere classified (R26.2);Pain Pain - part of body:  (back)    Time: 7106-2694 PT Time Calculation (min) (ACUTE ONLY): 53 min   Charges:   PT Evaluation $PT Eval Moderate Complexity: 1 Mod PT Treatments $Gait Training: 8-22 mins $Therapeutic Activity: 23-37 mins        Raymond Gurney, PT, DPT Acute Rehabilitation Services  Pager: [phone] Office: [phone]   Jewel Baize 01/23/2021, 5:57 PM

## 2021-01-23 NOTE — Progress Notes (Signed)
Late Entry   Discussed with attending and decision was made to remove left chest tube as imaging appeared improved and output decreased.   Observed medical student remove left chest tube at bedside. Patient tolerated procedure well.   Repeate CXR in am to assess ability to removed right chest tube   Cadyn Fann D. Tiburcio Pea, NP-C [facility] Personal contact information can be found on Amion  01/23/2021, 8:32 AM

## 2021-01-23 NOTE — Plan of Care (Signed)

## 2021-01-23 NOTE — Progress Notes (Signed)
   NAME:  Marco Kennedy, MRN:  [id number], DOB:  [birth year], LOS: 3 ADMISSION DATE:  01/20/2021, CONSULTATION DATE:  01/20/21 REFERRING MD:  Ophelia Charter, CHIEF COMPLAINT:  SOB   History of Present Illness:  70 year old man with hx of OSA on CPAP, recent lumbar hardware infection, epidural abscess who was Dc'd on prolonged IV antibiotics on 7/16 presenting with progressive SOB, chest pain, fevers, chills, and shoulder discomfort.  Imaging at [facility] ED revealed continued infection in thoracolumbar areas as well as new likely spread to pleura.  PCCM consulted to help manage pleural infection.  Pertinent  Medical History  OSA Pelvic hardware infection detailed in DC summary 7/22  Significant Hospital Events:  7/26 admitted from [facility] ED 7/27 changed to water seal. ID seeing, NSGY seeing 7/28 improved pleuritic pain, continues on water seal.  Still awaiting pleural cx. Left chest tube removed  7/29  Interim History / Subjective:  No acute events overnight  ~228ml from right chest tube in the last 24hrs  Objective   General: Pleasant elderly male lying in bed in NAD HEENT: [place]/AT, MM pink/moist, PERRL,  Neuro: Alert and oriented x3, non-focal  CV: s1s2 regular rate and rhythm, no murmur, rubs, or gallops,  PULM:  Clear to ascultation with diminished bilaterally, no increased work of breathing  GI: soft, bowel sounds active in all 4 quadrants, non-tender, non-distended Extremities: warm/dry, no edema  Skin: no rashes or lesions   Resolved Hospital Problem list     Assessment & Plan:  Acute hypoxemic respiratory failure Bilateral pleural effusions -Appears to be a parapneumonia effusion,  suspected spread from thoracolumbar-pelvic area infection  P Routine chest tube care Continue water seal, unable to tolerated suction due to pain Flush small bore chest tube  Monitor output  Mobilize as able  Ensure adequate pain control to avoid pulmonary splinting  ABT per ID  Best Practice   Per  primary  Signature:  Roarke Marciano D. Tiburcio Pea, NP-C [facility] Personal contact information can be found on Amion  01/23/2021, 9:58 AM

## 2021-01-23 NOTE — Progress Notes (Signed)
Patient refused BIPAP tonight. RT instructed patient to have RT called if he changes his mind. RT will monitor as needed.

## 2021-01-23 NOTE — Progress Notes (Signed)
HOSPITAL MEDICINE OVERNIGHT EVENT NOTE    Notified by nursing that patient has exhibited a substantial fever this evening of 102.9 F at 7:50 PM.  As needed Tylenol was administered and temperature is downtrending, currently 100.3 F however patient is exhibiting a downtrending blood pressure, last being 96/79.  Nursing reports that patient has exhibited minimal urine output this shift and is somewhat dark in appearance.  Chart reviewed, patient currently being treated for sepsis secondary to discitis and osteomyelitis with bilateral empyemas.  Blood cultures and pleural fluid cultures have been negative to date.  There was an incision and drainage done on 7/19 that grew E. coli.  We will repeat 2 sets of blood cultures now.  We will provide patient with 1 L of lactated Ringer solution however 2 hours.  Will monitor for improvement in blood pressure and monitor fever curve.  Marinda Elk  MD [facility]

## 2021-01-23 NOTE — Progress Notes (Signed)
[facility]  Date of Admission:  01/20/2021     CC: Hardware associated thoracic vertebral om/epidural absess   Lines:  Right ue picc from 7/16 admission   Abx: 7/26-c cefepime  7/26-7/28 vanc Home iv cefazolin                                                          Assessment: 70 yo male previous thoracolumbar hardware/back surgery 03/2020 complicated by thoracic vertebral om/epidural abscess s/p recent I&D 7/19 with cx ecoli (bcx negative at that time), readmitted a few days after discharged on 7/22 for fluid overload and bilateral pleural effusion and fever, s/p bilateral chest tube placement with effusion appearing exudative     #exudative bilateral pleural effusion #fluid overload Effusion appear exudative. Cx in progress. Bcx also in progress Previous admission without effusion or respiratory sx. No hx chf Rather atypical We know he has extensive thoracic spine infection so query spill over from prevertebral area with contiguous pleural involvement (that too would be atypical given the extent of effusion) Rather soon for PICC infection and IE with hematogenous spread involving the pleural space  7/16 and 7/26 bcx ngtd As of 7/29 pleural fluid cx ngtd -- suspect again parapneumonic process contiguous extension of thoracic vertebral OM 7/27 tte no evidence endocarditis Ongoing intermittent fever but curve seems improving and wbc improving and no new sx S/p left chest tube removal 7/29 Minimal output out of right chest tube Serial xray stable/improving minimal bilateral pleural effusion   -continue cefepime; after today if pleural fluid continues to be negative, can switch back to cefazolin 7/30 -repeat chest ct early next week -discussed with primary team/cardiology team/id pharmacy      #hardware associated thoracic vertebral om/epidural abscess S/p I&D 7/19. Cx ecoli Patient unclear if back pain is worse in setting of  chest pain/fluid overload-sob Sepsis/fever while on cefazolin ?pleural process vs back process NSG team reevaluated patient --> no neurological deficit so no planned urgent repeat imaging  -agree with NSG repeat imaging thoraco/lumbar mri need in around 3 weeks -abx plan as above    I spent more than 35 minute reviewing data/chart, and coordinating care and >50% direct face to face time providing counseling/discussing diagnostics/treatment plan with patient   Principal Problem:   Sepsis due to undetermined organism [facility] Active Problems:   Vertebral osteomyelitis (HCC)   Hardware complicating wound infection (HCC)   Pleural effusion   Anasarca   OSA (obstructive sleep apnea)   Obesity, Class III, BMI 40-49.9 (morbid obesity) (HCC)   No Known Allergies  Scheduled Meds:  Chlorhexidine Gluconate Cloth  6 each Topical Daily   docusate sodium  100 mg Oral BID   enoxaparin (LOVENOX) injection  40 mg Subcutaneous Daily   feeding supplement  237 mL Oral TID BM   multivitamin with minerals  1 tablet Oral Daily   sodium chloride flush  10-40 mL Intracatheter Q12H   sodium chloride flush  3 mL Intravenous Q12H   Continuous Infusions:  ceFEPime (MAXIPIME) IV Stopped (01/23/21 0546)   PRN Meds:.acetaminophen **OR** acetaminophen, dextromethorphan, ketorolac, morphine injection, ondansetron **OR** ondansetron (ZOFRAN) IV, oxyCODONE, polyethylene glycol, sodium chloride flush   SUBJECTIVE: Left chest tube removed Right chest tube minimal output No n/v/diarrhea/rash Pain chest/back improving  Fever intermittently still but curve improving Swelling in legs also coming down  Review of Systems: ROS All other ROS was negative, except mentioned above     OBJECTIVE: Vitals:   01/22/21 2336 01/23/21 0416 01/23/21 0429 01/23/21 0816  BP: (!) 123/51 (!) 159/75  (!) 163/70  Pulse: 85 89  92  Resp: (!) 22 20  (!) 29  Temp: 99.2 F (37.3 C) 99.2 F (37.3 C)  (!) 101.9 F (38.8 C)   TempSrc: Oral Oral  Oral  SpO2: 91% 98%  95%  Weight:   131.4 kg    Body mass index is 45.37 kg/m.  Physical Exam General/constitutional: no distress, pleasant HEENT: Normocephalic, PER, Conj Clear, EOMI, Oropharynx clear Neck supple CV: rrr no mrg Lungs: clear to auscultation, normal respiratory effort; right chest tube in place, chamber with serosanguinous fluid Abd: Soft, Nontender Ext: bilateral le edema 1+, improving Skin: No Rash Neuro: nonfocal MSK: no peripheral joint swelling/tenderness/warmth; back spines nontender; dressign c/d on back    Central line presence: RUE picc site no purulence/erythema   Lab Results Lab Results  Component Value Date   WBC 23.2 (H) 01/21/2021   HGB 9.7 (L) 01/21/2021   HCT 30.3 (L) 01/21/2021   MCV 88.1 01/21/2021   PLT 475 (H) 01/21/2021    Lab Results  Component Value Date   CREATININE 0.86 01/21/2021   BUN 18 01/21/2021   NA 134 (L) 01/21/2021   K 4.1 01/21/2021   CL 98 01/21/2021   CO2 29 01/21/2021    Lab Results  Component Value Date   ALT 13 01/20/2021   AST 37 01/20/2021   ALKPHOS 92 01/20/2021   BILITOT 0.5 01/20/2021      Microbiology: Recent Results (from the past 240 hour(s))  MRSA Next Gen by PCR, Nasal     Status: None   Collection Time: 01/20/21  5:07 PM   Specimen: Nasal Mucosa; Nasal Swab  Result Value Ref Range Status   MRSA by PCR Next Gen NOT DETECTED NOT DETECTED Final    Comment: (NOTE) The GeneXpert MRSA Assay (FDA approved for NASAL specimens only), is one component of a comprehensive MRSA colonization surveillance program. It is not intended to diagnose MRSA infection nor to guide or monitor treatment for MRSA infections. Test performance is not FDA approved in patients less than 70 years old. Performed at [facility], [address] [place], [place] [address]   Culture, body fluid w Gram Stain-bottle     Status: None (Preliminary result)   Collection Time: 01/20/21  6:30 PM    Specimen: Fluid  Result Value Ref Range Status   Specimen Description FLUID LEFT PLEURAL  Final   Special Requests   Final    BOTTLES DRAWN AEROBIC AND ANAEROBIC Blood Culture adequate volume   Culture   Final    NO GROWTH 2 DAYS Performed at [facility], [address] [place], [place] [address]    Report Status PENDING  Incomplete  Gram stain     Status: None   Collection Time: 01/20/21  6:30 PM   Specimen: Fluid  Result Value Ref Range Status   Specimen Description FLUID LEFT PLEURAL  Final   Special Requests NONE  Final   Gram Stain   Final    MODERATE WBC PRESENT, PREDOMINANTLY PMN NO ORGANISMS SEEN Performed at [facility], [address] [place], [place] [address]    Report Status 01/21/2021 FINAL  Final  Culture, body  fluid w Gram Stain-bottle     Status: None (Preliminary result)   Collection Time: 01/20/21  6:30 PM   Specimen: Fluid  Result Value Ref Range Status   Specimen Description FLUID RIGHT PLEURAL  Final   Special Requests   Final    BOTTLES DRAWN AEROBIC AND ANAEROBIC Blood Culture adequate volume   Culture   Final    NO GROWTH 2 DAYS Performed at [facility], [address], [place], [place] [address]    Report Status PENDING  Incomplete  Gram stain     Status: None   Collection Time: 01/20/21  6:30 PM   Specimen: Fluid  Result Value Ref Range Status   Specimen Description FLUID RIGHT PLEURAL  Final   Special Requests NONE  Final   Gram Stain   Final    ABUNDANT WBC PRESENT, PREDOMINANTLY PMN NO ORGANISMS SEEN Performed at [facility], [address], [place], [place] [address]    Report Status 01/21/2021 FINAL  Final  Culture, blood (routine x 2)     Status: None (Preliminary result)   Collection Time: 01/20/21  8:39 PM   Specimen: BLOOD LEFT ARM  Result Value Ref Range Status   Specimen Description BLOOD LEFT ARM  Final   Special Requests   Final    BOTTLES DRAWN AEROBIC AND ANAEROBIC Blood Culture adequate volume    Culture   Final    NO GROWTH 2 DAYS Performed at [facility], [address], [place], [place] [address]    Report Status PENDING  Incomplete  Culture, blood (routine x 2)     Status: None (Preliminary result)   Collection Time: 01/20/21  8:39 PM   Specimen: BLOOD LEFT HAND  Result Value Ref Range Status   Specimen Description BLOOD LEFT HAND  Final   Special Requests   Final    BOTTLES DRAWN AEROBIC AND ANAEROBIC Blood Culture adequate volume   Culture   Final    NO GROWTH 2 DAYS Performed at [facility], [address] [place], [place] [address]    Report Status PENDING  Incomplete  Urine Culture     Status: None   Collection Time: 01/21/21  7:31 AM   Specimen: Urine, Clean Catch  Result Value Ref Range Status   Specimen Description URINE, CLEAN CATCH  Final   Special Requests NONE  Final   Culture   Final    NO GROWTH Performed at [facility], [address] [place], [place] [address]    Report Status 01/22/2021 FINAL  Final     Serology:   Imaging: If present, new imagings (plain films, ct scans, and mri) have been personally visualized and interpreted; radiology reports have been reviewed. Decision making incorporated into the Impression / Recommendations.  7/27 tte  1. Left ventricular ejection fraction, by estimation, is 65 to 70%. The  left ventricle has normal function. The left ventricle has no regional  wall motion abnormalities. There is mild left ventricular hypertrophy.  Left ventricular diastolic parameters  were normal.   2. Right ventricular systolic function is normal. The right ventricular  size is normal. Tricuspid regurgitation signal is inadequate for assessing  PA pressure.   3. The mitral valve is normal in structure. No evidence of mitral valve  regurgitation.   4. The aortic valve was not well visualized. Aortic valve regurgitation  is not visualized. No aortic stenosis is present.   5. The inferior vena  cava is  normal in size with greater than 50%  respiratory variability, suggesting right atrial pressure of 3 mmHg.   Conclusion(s)/Recommendation(s): No vegetation seen. If high clinical  suspicion for endocarditis, consider TEE.    7/29 cxr Interval left chest tube removal. Unchanged small pleural effusions and bibasilar atelectasis. No pneumothorax.   Raymondo Band, MD [facility] [phone] pager    01/23/2021, 11:58 AM

## 2021-01-23 NOTE — Progress Notes (Addendum)
[facility] PROGRESS NOTE    Progress Note  Marco Kennedy  ZOX:[id number] DOB: [birth year] DOA: 01/20/2021 PCP: [name], [name], MD     Brief Narrative:   Marco Kennedy is an 70 y.o. male past medical history of obstructive sleep apnea on CPAP recent discharge from the hospital 01/10/2021 for sepsis secondary to moderate-sized abscess near the pelvic hardware during this time neurosurgery IR and ID were involved in his care he underwent wound debridement on 01/13/2021 status post back surgery insertion of hardware, E. coli grew from wound, was treated with IV cefazolin and the plan was to continue 6 weeks of IV antibiotics via PICC line.  Went home on 01/16/2021 started complaining of rib pain he has gained 10 pounds since hospital discharge.  Since discharge she has been taking diclofenac twice a day for knee pain.  He started having fevers, [facility] with sepsis hypoxic which improved with 4 L of oxygen.  CT of the chest show empyema as well as discitis osteomyelitis of T10 with a white blood cell count of 10 he was started empirically on IV vancomycin and Ancef*chec with normal renal function.  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 Continue iv cefepime, discontinue vancomycin. ID on board recommended to repeat CT of the chest early next week. 2D echo showed no wall motion abnormalities, no TEE at this point in time. ID also recommended to leave PICC line in place if culture negative. Neurosurgery was consulted who recommended no further hardware washout or removal.  Recommend an MRI in 3 to 4 weeks. He is status post I&D done 01/13/2021 with culture that grew E. coli.  Unclear if pain is worst,  Neurosurgery reevaluated the patient recommended no intervention.  Acute respiratory failure with hypoxia secondary to bilateral empyema: Pulmonary was consulted bilateral pigtail catheter pleural space cultures are negative till date. Further management of chest tube per pulmonary.  Anasarca: Likely due to chronic inflammation in the setting of hypoalbuminemia. Consult nutrition start him on Ensure 3 times daily.  Obstructive sleep apnea: Continue CPAP at night.  Constipation: On MiraLAX as needed.  Obesity: With a BMI of 42 counseling.  DVT prophylaxis: lovenox Family Communication:none Status is: Inpatient  Remains inpatient appropriate because:Hemodynamically unstable  Dispo: The patient is from: Home              Anticipated d/c is to: SNF              Patient currently is not medically stable to d/c.   Difficult to place patient No        Code Status:     Code Status Orders  (From admission, onward)           Start     Ordered   01/20/21 1741  Full code  Continuous        01/20/21 1749           Code Status History     Date Active Date Inactive Code Status Order ID Comments User Context   01/11/2021 0450 01/16/2021 2221 Full Code [id number]  Darlin DropHall, Carole N, DO Inpatient   04/18/2020 1425 05/08/2020 0124 Full Code [id number]  Maeola HarmanStern, Joseph, MD Inpatient   06/15/2016 1801 06/16/2016 1832 Full  Code [id number]  [name], PA-C Inpatient         IV Access:   Peripheral IV   Procedures and diagnostic studies:   DG CHEST PORT 1 VIEW  Result Date: 01/22/2021 CLINICAL DATA:  Pleural effusion.  Bilateral chest tubes. EXAM: PORTABLE CHEST 1 VIEW COMPARISON:  01/20/2021 FINDINGS: Bilateral chest tubes in stable position. No pneumothorax. Right PICC line in stable position. Stable cardiomegaly. Persistent bibasilar atelectasis/infiltrates and small bilateral pleural effusions. Degenerative change thoracic spine. Prior thoracolumbar spine fusion.  IMPRESSION: 1.  Bilateral chest tubes in stable position.  No pneumothorax. 2. Persistent bibasilar atelectasis/infiltrates and small bilateral pleural effusions. 3.  Stable cardiomegaly. Electronically Signed   By: Maisie Fus  Register   On: 01/22/2021 06:25   ECHOCARDIOGRAM COMPLETE  Result Date: 01/21/2021    ECHOCARDIOGRAM REPORT   Patient Name:   Marco Kennedy Date of Exam: 01/21/2021 Medical Rec #:  [id number]        Height:       67.0 in Accession #:    [id number]       Weight:       269.4 lb Date of Birth:  1951-03-04        BSA:          2.295 m Patient Age:    70 years         BP:           144/73 mmHg Patient Gender: M                HR:           109 bpm. Exam Location:  Inpatient Procedure: 2D Echo, Color Doppler, Cardiac Doppler and Intracardiac            Opacification Agent Indications:    Endocarditis  History:        Patient has no prior history of Echocardiogram examinations.  Sonographer:    Eulah Pont RDCS Referring Phys: [phone] Illene Sweeting FELIZ ORTIZ IMPRESSIONS  1. Left ventricular ejection fraction, by estimation, is 65 to 70%. The left ventricle has normal function. The left ventricle has no regional wall motion abnormalities. There is mild left ventricular hypertrophy. Left ventricular diastolic parameters were normal.  2. Right ventricular systolic function is normal. The right ventricular size is normal. Tricuspid regurgitation signal is inadequate for assessing PA pressure.  3. The mitral valve is normal in structure. No evidence of mitral valve regurgitation.  4. The aortic valve was not well visualized. Aortic valve regurgitation is not visualized. No aortic stenosis is present.  5. The inferior vena cava is normal in size with greater than 50% respiratory variability, suggesting right atrial pressure of 3 mmHg. Conclusion(s)/Recommendation(s): No vegetation seen. If high clinical suspicion for endocarditis, consider TEE. FINDINGS  Left Ventricle: Left ventricular ejection fraction, by  estimation, is 65 to 70%. The left ventricle has normal function. The left ventricle has no regional wall motion abnormalities. Definity contrast agent was given IV to delineate the left ventricular  endocardial borders. The left ventricular internal cavity size was normal in size. There is mild left ventricular hypertrophy. Left ventricular diastolic parameters were normal. Right Ventricle: The right ventricular size is normal. No increase in right ventricular wall thickness. Right ventricular systolic function is normal. Tricuspid regurgitation signal is inadequate for assessing PA pressure. Left Atrium: Left atrial size was normal in size. Right Atrium: Right atrial size was normal in size. Pericardium: There is no evidence of pericardial effusion. Mitral Valve: The mitral valve  is normal in structure. No evidence of mitral valve regurgitation. Tricuspid Valve: The tricuspid valve is normal in structure. Tricuspid valve regurgitation is trivial. Aortic Valve: The aortic valve was not well visualized. Aortic valve regurgitation is not visualized. No aortic stenosis is present. Pulmonic Valve: The pulmonic valve was not well visualized. Pulmonic valve regurgitation is not visualized. Aorta: The aortic root and ascending aorta are structurally normal, with no evidence of dilitation. Venous: The inferior vena cava is normal in size with greater than 50% respiratory variability, suggesting right atrial pressure of 3 mmHg. IAS/Shunts: The interatrial septum was not well visualized.  LEFT VENTRICLE PLAX 2D LVIDd:         4.80 cm  Diastology LVIDs:         2.80 cm  LV e' medial:    8.58 cm/s LV PW:         1.30 cm  LV E/e' medial:  8.6 LV IVS:        1.20 cm  LV e' lateral:   10.40 cm/s LVOT diam:     2.30 cm  LV E/e' lateral: 7.1 LV SV:         73 LV SV Index:   32 LVOT Area:     4.15 cm  RIGHT VENTRICLE RV S prime:     14.50 cm/s TAPSE (M-mode): 2.3 cm LEFT ATRIUM             Index       RIGHT ATRIUM           Index  LA diam:        4.00 cm 1.74 cm/m  RA Area:     15.30 cm LA Vol (A2C):   43.1 ml 18.78 ml/m RA Volume:   31.10 ml  13.55 ml/m LA Vol (A4C):   38.6 ml 16.82 ml/m LA Biplane Vol: 42.9 ml 18.69 ml/m  AORTIC VALVE LVOT Vmax:   130.50 cm/s LVOT Vmean:  86.650 cm/s LVOT VTI:    0.175 m  AORTA Ao Root diam: 3.40 cm Ao Asc diam:  3.70 cm MITRAL VALVE MV Area (PHT): 4.44 cm     SHUNTS MV Decel Time: 171 msec     Systemic VTI:  0.18 m MV E velocity: 73.50 cm/s   Systemic Diam: 2.30 cm MV A velocity: 125.00 cm/s MV E/A ratio:  0.59 Epifanio Lesches MD Electronically signed by Epifanio Lesches MD Signature Date/Time: 01/21/2021/3:27:40 PM    Final      Medical Consultants:   None.   Subjective:    BIENVENIDO PROEHL pain is controlled would like something to eat.  Objective:    Vitals:   01/22/21 1950 01/22/21 2336 01/23/21 0416 01/23/21 0429  BP: (!) 150/73 (!) 123/51 (!) 159/75   Pulse: 90 85 89   Resp: (!) 22 (!) 22 20   Temp: 99.9 F (37.7 C) 99.2 F (37.3 C) 99.2 F (37.3 C)   TempSrc: Oral Oral Oral   SpO2: 96% 91% 98%   Weight:    131.4 kg   SpO2: 98 % O2 Flow Rate (L/min): 2 L/min   Intake/Output Summary (Last 24 hours) at 01/23/2021 0732 Last data filed at 01/23/2021 0700 Gross per 24 hour  Intake 1763 ml  Output 1635 ml  Net 128 ml    Filed Weights   01/22/21 0340 01/23/21 0429  Weight: 131.5 kg 131.4 kg    Exam: General exam: In no acute distress. Respiratory system: Good air movement and clear to  auscultation. Cardiovascular system: S1 & S2 heard, RRR. No JVD. Gastrointestinal system: Abdomen is nondistended, soft and nontender.  Extremities: No pedal edema. Skin: No rashes, lesions or ulcers  Data Reviewed:    Labs: Basic Metabolic Panel: Recent Labs  Lab 01/20/21 2039 01/21/21 0025  NA 134* 134*  K 4.2 4.1  CL 99 98  CO2 28 29  GLUCOSE 162* 152*  BUN 20 18  CREATININE 0.81 0.86  CALCIUM 7.9* 7.6*    GFR Estimated Creatinine  Clearance: 104.2 mL/min (by C-G formula based on SCr of 0.86 mg/dL). Liver Function Tests: Recent Labs  Lab 01/20/21 2039  AST 37  ALT 13  ALKPHOS 92  BILITOT 0.5  PROT 5.1*  ALBUMIN 1.7*    No results for input(s): LIPASE, AMYLASE in the last 168 hours. No results for input(s): AMMONIA in the last 168 hours. Coagulation profile No results for input(s): INR, PROTIME in the last 168 hours. COVID-19 Labs  Recent Labs    01/20/21 2039  CRP 35.8*     Lab Results  Component Value Date   SARSCOV2NAA NEGATIVE 01/13/2021   SARSCOV2NAA NEGATIVE 05/07/2020   SARSCOV2NAA NEGATIVE 04/15/2020    CBC: Recent Labs  Lab 01/20/21 2039 01/21/21 0025  WBC 29.0* 23.2*  NEUTROABS 26.2*  --   HGB 8.9* 9.7*  HCT 28.4* 30.3*  MCV 88.5 88.1  PLT 594* 475*    Cardiac Enzymes: No results for input(s): CKTOTAL, CKMB, CKMBINDEX, TROPONINI in the last 168 hours. BNP (last 3 results) No results for input(s): PROBNP in the last 8760 hours. CBG: No results for input(s): GLUCAP in the last 168 hours. D-Dimer: No results for input(s): DDIMER in the last 72 hours. Hgb A1c: No results for input(s): HGBA1C in the last 72 hours. Lipid Profile: No results for input(s): CHOL, HDL, LDLCALC, TRIG, CHOLHDL, LDLDIRECT in the last 72 hours. Thyroid function studies: No results for input(s): TSH, T4TOTAL, T3FREE, THYROIDAB in the last 72 hours.  Invalid input(s): FREET3 Anemia work up: No results for input(s): VITAMINB12, FOLATE, FERRITIN, TIBC, IRON, RETICCTPCT in the last 72 hours. Sepsis Labs: Recent Labs  Lab 01/20/21 2039 01/20/21 2050 01/21/21 0025  PROCALCITON 1.01  --   --   WBC 29.0*  --  23.2*  LATICACIDVEN  --  1.3 0.8    Microbiology Recent Results (from the past 240 hour(s))  SARS Coronavirus 2 by RT PCR (hospital order, performed in [facility] hospital lab) Nasopharyngeal Nasopharyngeal Swab     Status: None   Collection Time: 01/13/21  7:53 AM   Specimen:  Nasopharyngeal Swab  Result Value Ref Range Status   SARS Coronavirus 2 NEGATIVE NEGATIVE Final    Comment: (NOTE) SARS-CoV-2 target nucleic acids are NOT DETECTED.  The SARS-CoV-2 RNA is generally detectable in upper and lower respiratory specimens during the acute phase of infection. The lowest concentration of SARS-CoV-2 viral copies this assay can detect is 250 copies / mL. A negative result does not preclude SARS-CoV-2 infection and should not be used as the sole basis for treatment or other patient management decisions.  A negative result may occur with improper specimen collection / handling, submission of specimen other than nasopharyngeal swab, presence of viral mutation(s) within the areas targeted by this assay, and inadequate number of viral copies (<250 copies / mL). A negative result must be combined with clinical observations, patient history, and epidemiological information.  Fact Sheet for Patients:   BoilerBrush.com.cy  Fact Sheet for Healthcare Providers: https://pope.com/  This test  is not yet approved or  cleared by the [place] and has been authorized for detection and/or diagnosis of SARS-CoV-2 by FDA under an Emergency Use Authorization (EUA).  This EUA will remain in effect (meaning this test can be used) for the duration of the COVID-19 declaration under Section 564(b)(1) of the Act, 21 U.S.C. section 360bbb-3(b)(1), unless the authorization is terminated or revoked sooner.  Performed at [facility], [address] [place], [place] [address]   Aerobic/Anaerobic Culture w Gram Stain (surgical/deep wound)     Status: None   Collection Time: 01/13/21  9:48 AM   Specimen: PATH Other; Tissue  Result Value Ref Range Status   Specimen Description WOUND  Final   Special Requests LEFT THORACOLUMBAR SPEC A  Final   Gram Stain NO WBC SEEN NO ORGANISMS SEEN   Final   Culture   Final    RARE  ESCHERICHIA COLI SUSCEPTIBILITIES PERFORMED ON PREVIOUS CULTURE WITHIN THE LAST 5 DAYS. NO ANAEROBES ISOLATED Performed at [facility], [address], [place], [place] [address]    Report Status 01/18/2021 FINAL  Final  Aerobic/Anaerobic Culture w Gram Stain (surgical/deep wound)     Status: None   Collection Time: 01/13/21  9:55 AM   Specimen: PATH Other; Tissue  Result Value Ref Range Status   Specimen Description TISSUE  Final   Special Requests LEFT THORACOLUMBAR  Final   Gram Stain   Final    RARE WBC PRESENT, PREDOMINANTLY MONONUCLEAR NO ORGANISMS SEEN    Culture   Final    RARE ESCHERICHIA COLI SUSCEPTIBILITIES PERFORMED ON PREVIOUS CULTURE WITHIN THE LAST 5 DAYS. NO ANAEROBES ISOLATED Performed at [facility], [address] [place], [place] [address]    Report Status 01/18/2021 FINAL  Final  Aerobic/Anaerobic Culture w Gram Stain (surgical/deep wound)     Status: None   Collection Time: 01/13/21  9:57 AM   Specimen: PATH Other; Tissue  Result Value Ref Range Status   Specimen Description WOUND  Final   Special Requests LEFT THORACOLUMBAR SPEC C  Final   Gram Stain NO WBC SEEN NO ORGANISMS SEEN   Final   Culture   Final    RARE ESCHERICHIA COLI SUSCEPTIBILITIES PERFORMED ON PREVIOUS CULTURE WITHIN THE LAST 5 DAYS. NO ANAEROBES ISOLATED Performed at [facility], [address] [place], [place] [address]    Report Status 01/18/2021 FINAL  Final  Aerobic/Anaerobic Culture w Gram Stain (surgical/deep wound)     Status: None   Collection Time: 01/13/21  9:58 AM   Specimen: PATH Other; Tissue  Result Value Ref Range Status   Specimen Description WOUND  Final   Special Requests LEFT THORACOLUMBAR SPEC B  Final   Gram Stain NO WBC SEEN NO ORGANISMS SEEN   Final   Culture   Final    RARE ESCHERICHIA COLI SUSCEPTIBILITIES PERFORMED ON PREVIOUS CULTURE WITHIN THE LAST 5 DAYS. NO ANAEROBES ISOLATED Performed at [facility], [address] [place], [place] [address]    Report Status 01/18/2021 FINAL  Final  MRSA Next Gen by PCR, Nasal     Status: None   Collection Time: 01/20/21  5:07 PM   Specimen: Nasal Mucosa; Nasal Swab  Result Value Ref Range Status   MRSA by PCR Next Gen NOT DETECTED NOT DETECTED Final    Comment: (NOTE) The GeneXpert MRSA Assay (FDA approved for NASAL specimens only), is one component of a comprehensive  MRSA colonization surveillance program. It is not intended to diagnose MRSA infection nor to guide or monitor treatment for MRSA infections. Test performance is not FDA approved in patients less than 43 years old. Performed at [facility], [address], [place], [place] [address]   Culture, body fluid w Gram Stain-bottle     Status: None (Preliminary result)   Collection Time: 01/20/21  6:30 PM   Specimen: Fluid  Result Value Ref Range Status   Specimen Description FLUID LEFT PLEURAL  Final   Special Requests   Final    BOTTLES DRAWN AEROBIC AND ANAEROBIC Blood Culture adequate volume   Culture   Final    NO GROWTH 2 DAYS Performed at [facility], [address], [place], [place] [address]    Report Status PENDING  Incomplete  Gram stain     Status: None   Collection Time: 01/20/21  6:30 PM   Specimen: Fluid  Result Value Ref Range Status   Specimen Description FLUID LEFT PLEURAL  Final   Special Requests NONE  Final   Gram Stain   Final    MODERATE WBC PRESENT, PREDOMINANTLY PMN NO ORGANISMS SEEN Performed at [facility] Lab, [address] [place], [place] [address]    Report Status 01/21/2021 FINAL  Final  Culture, body fluid w Gram Stain-bottle     Status: None (Preliminary result)   Collection Time: 01/20/21  6:30 PM   Specimen: Fluid  Result Value Ref Range Status   Specimen Description FLUID RIGHT PLEURAL  Final   Special Requests   Final    BOTTLES DRAWN AEROBIC AND ANAEROBIC Blood Culture adequate volume   Culture   Final    NO GROWTH 2  DAYS Performed at [facility] Lab, [address], [place], [place] [address]    Report Status PENDING  Incomplete  Gram stain     Status: None   Collection Time: 01/20/21  6:30 PM   Specimen: Fluid  Result Value Ref Range Status   Specimen Description FLUID RIGHT PLEURAL  Final   Special Requests NONE  Final   Gram Stain   Final    ABUNDANT WBC PRESENT, PREDOMINANTLY PMN NO ORGANISMS SEEN Performed at [facility], [address], [place], [place] [address]    Report Status 01/21/2021 FINAL  Final  Culture, blood (routine x 2)     Status: None (Preliminary result)   Collection Time: 01/20/21  8:39 PM   Specimen: BLOOD LEFT ARM  Result Value Ref Range Status   Specimen Description BLOOD LEFT ARM  Final   Special Requests   Final    BOTTLES DRAWN AEROBIC AND ANAEROBIC Blood Culture adequate volume   Culture   Final    NO GROWTH 2 DAYS Performed at [facility], [address], [place], [place] [address]    Report Status PENDING  Incomplete  Culture, blood (routine x 2)     Status: None (Preliminary result)   Collection Time: 01/20/21  8:39 PM   Specimen: BLOOD LEFT HAND  Result Value Ref Range Status   Specimen Description BLOOD LEFT HAND  Final   Special Requests   Final    BOTTLES DRAWN AEROBIC AND ANAEROBIC Blood Culture adequate volume   Culture   Final    NO GROWTH 2 DAYS Performed at [facility], [address], [place], [place] [address]    Report Status PENDING  Incomplete  Urine Culture  Status: None   Collection Time: 01/21/21  7:31 AM   Specimen: Urine, Clean Catch  Result Value Ref Range Status   Specimen Description URINE, CLEAN CATCH  Final   Special Requests NONE  Final   Culture   Final    NO GROWTH Performed at [facility], [address], [place], [place] [address]    Report Status 01/22/2021 FINAL  Final     Medications:    Chlorhexidine Gluconate Cloth  6 each Topical Daily   docusate sodium  100 mg Oral BID    enoxaparin (LOVENOX) injection  40 mg Subcutaneous Daily   feeding supplement  237 mL Oral TID BM   multivitamin with minerals  1 tablet Oral Daily   sodium chloride flush  10-40 mL Intracatheter Q12H   sodium chloride flush  3 mL Intravenous Q12H   Continuous Infusions:  ceFEPime (MAXIPIME) IV Stopped (01/23/21 0546)      LOS: 3 days   Marinda Elk  [facility]  01/23/2021, 7:32 AM

## 2021-01-24 DIAGNOSIS — G4733 Obstructive sleep apnea (adult) (pediatric): Secondary | ICD-10-CM | POA: Diagnosis not present

## 2021-01-24 DIAGNOSIS — A419 Sepsis, unspecified organism: Secondary | ICD-10-CM | POA: Diagnosis not present

## 2021-01-24 DIAGNOSIS — J189 Pneumonia, unspecified organism: Secondary | ICD-10-CM | POA: Diagnosis not present

## 2021-01-24 DIAGNOSIS — T847XXD Infection and inflammatory reaction due to other internal orthopedic prosthetic devices, implants and grafts, subsequent encounter: Secondary | ICD-10-CM | POA: Diagnosis not present

## 2021-01-24 DIAGNOSIS — M8609 Acute hematogenous osteomyelitis, multiple sites: Secondary | ICD-10-CM

## 2021-01-24 DIAGNOSIS — Z9689 Presence of other specified functional implants: Secondary | ICD-10-CM | POA: Diagnosis not present

## 2021-01-24 DIAGNOSIS — R601 Generalized edema: Secondary | ICD-10-CM | POA: Diagnosis not present

## 2021-01-24 LAB — CBC WITH DIFFERENTIAL/PLATELET
Abs Immature Granulocytes: 0.27 10*3/uL — ABNORMAL HIGH (ref 0.00–0.07)
Basophils Absolute: 0.1 10*3/uL (ref 0.0–0.1)
Basophils Relative: 0 %
Eosinophils Absolute: 0.3 10*3/uL (ref 0.0–0.5)
Eosinophils Relative: 2 %
HCT: 30.2 % — ABNORMAL LOW (ref 39.0–52.0)
Hemoglobin: 9.9 g/dL — ABNORMAL LOW (ref 13.0–17.0)
Immature Granulocytes: 2 %
Lymphocytes Relative: 5 %
Lymphs Abs: 0.8 10*3/uL (ref 0.7–4.0)
MCH: 28.4 pg (ref 26.0–34.0)
MCHC: 32.8 g/dL (ref 30.0–36.0)
MCV: 86.8 fL (ref 80.0–100.0)
Monocytes Absolute: 1.3 10*3/uL — ABNORMAL HIGH (ref 0.1–1.0)
Monocytes Relative: 8 %
Neutro Abs: 13.9 10*3/uL — ABNORMAL HIGH (ref 1.7–7.7)
Neutrophils Relative %: 83 %
Platelets: 380 10*3/uL (ref 150–400)
RBC: 3.48 MIL/uL — ABNORMAL LOW (ref 4.22–5.81)
RDW: 13.8 % (ref 11.5–15.5)
WBC: 16.5 10*3/uL — ABNORMAL HIGH (ref 4.0–10.5)
nRBC: 0 % (ref 0.0–0.2)

## 2021-01-24 LAB — BASIC METABOLIC PANEL
Anion gap: 8 (ref 5–15)
BUN: 19 mg/dL (ref 8–23)
CO2: 29 mmol/L (ref 22–32)
Calcium: 7.6 mg/dL — ABNORMAL LOW (ref 8.9–10.3)
Chloride: 92 mmol/L — ABNORMAL LOW (ref 98–111)
Creatinine, Ser: 0.81 mg/dL (ref 0.61–1.24)
GFR, Estimated: >60 mL/min (ref >60)
Glucose, Bld: 184 mg/dL — ABNORMAL HIGH (ref 70–99)
Potassium: 4.6 mmol/L (ref 3.5–5.1)
Sodium: 129 mmol/L — ABNORMAL LOW (ref 135–145)

## 2021-01-24 MED ORDER — CEFAZOLIN SODIUM-DEXTROSE 2-4 GM/100ML-% IV SOLN
2.0000 g | Freq: Three times a day (TID) | INTRAVENOUS | Status: DC
  Administered 2021-01-24 – 2021-02-03 (×29): 2 g via INTRAVENOUS
  Filled 2021-01-24 (×31): qty 100

## 2021-01-24 MED ORDER — POLYETHYLENE GLYCOL 3350 17 G PO PACK
17.0000 g | PACK | Freq: Two times a day (BID) | ORAL | Status: AC
  Administered 2021-01-24 – 2021-01-25 (×4): 17 g via ORAL
  Filled 2021-01-24 (×4): qty 1

## 2021-01-24 NOTE — Plan of Care (Signed)

## 2021-01-24 NOTE — Progress Notes (Signed)
RT set up pt's home CPAP and placed on patient with 2L O2 bled into CPAP circuit. Patient tolerating well at this time. RT will monitor as needed.

## 2021-01-24 NOTE — Progress Notes (Signed)
   NAME:  Marco Kennedy, MRN:  [id number], DOB:  Jun 27, 1951, LOS: 4 ADMISSION DATE:  01/20/2021, CONSULTATION DATE:  01/20/21 REFERRING MD:  Ophelia Charter, CHIEF COMPLAINT:  SOB   History of Present Illness:  70 year old man with hx of OSA on CPAP, recent lumbar hardware infection, epidural abscess who was Dc'd on prolonged IV antibiotics on 7/16 presenting with progressive SOB, chest pain, fevers, chills, and shoulder discomfort.  Imaging at [facility] ED revealed continued infection in thoracolumbar areas as well as new likely spread to pleura.  PCCM consulted to help manage pleural infection.  Pertinent  Medical History  OSA Pelvic hardware infection detailed in DC summary 7/22  Significant Hospital Events:  7/26 admitted from [facility] ED 7/27 changed to water seal. ID seeing, NSGY seeing 7/28 improved pleuritic pain, continues on water seal.  Still awaiting pleural cx. Left chest tube removed  7/29 200cc in 24 hours in the right tube 7/30 70cc in the last 24 hours in the right tube  Interim History / Subjective:  Fevered overnight, blood culture sent. Having pain at site of chest tube on right side.  Wore cpap last night.  Objective    Vitals:   01/24/21 0603 01/24/21 0810  BP:  (!) 152/62  Pulse:  83  Resp:  19  Temp: 99 F (37.2 C) 99.6 F (37.6 C)  SpO2:  95%    General: Pleasant elderly male lying in bed in NAD HEENT: [place]/AT, MM pink/moist, PERRL,  Neuro: Alert and oriented x3, non-focal  CV: RRR no mrg PULM:  Clear to ascultation with diminished bilaterally, no increased work of breathing, right chest tube in place GI: obese, soft Extremities: RUE PICC in place, mild edema bilateral upper and lower extremities Skin: no rashes or lesions   Resolved Hospital Problem list     Assessment & Plan:  Acute hypoxemic respiratory failure Bilateral parapneumonic pleural effusions OSA on CPAP 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  Will discontinue chest tube, ordered placed and discussed with nursing.  Continue abx per ID.  Continue CPAP, he is managed by a pulmonary/sleep physician in [place] for this.  Consider diuresis if able as he is getting rather puffy  Signature:   Durel Salts, MD Pulmonary and Critical Care Medicine [facility]

## 2021-01-24 NOTE — Progress Notes (Signed)
Physical Therapy Treatment Patient Details Name: Marco Kennedy MRN: [id number] DOB: 1951/02/05 Today's Date: 01/24/2021    History of Present Illness Pt is a 70 y.o. male who presented 01/20/21 with SOB, increased weight gain, back pain, and fever. Of note, recent admission from 7/16-22 for sepsis secondary to moderate-sized abscess near the pelvic hardware. CT of the chest show empyema as well as discitis osteomyelitis of T10. S/p bil chest tube placement 7/26, L chest tube removed 7/29. PMH: back surgery with pelvis fixation in 03/2020, OSA on BIPAP, arthritis    PT Comments    Pt progressing slowly towards his physical therapy goals. Pain well controlled today, however, pt sleepy, which pt wife attributes to pain medication. Pt requiring two person min-mod assist for functional mobility. Able to take pivotal steps from bed to chair using a walker. Noted "jerking," in all four extremities (RN notified). Desaturation to 85% on RA; on 2L O2 rebounded to 93%. Pt continues with generalized weakness, decreased endurance, balance deficits. Recommend CIR to maximize functional mobility and decrease caregiver burden. Suspect good progress in light of PLOF, motivation and family support.    Follow Up Recommendations  CIR;Supervision/Assistance - 24 hour     Equipment Recommendations  Wheelchair (measurements PT);Wheelchair cushion (measurements PT)    Recommendations for Other Services       Precautions / Restrictions Precautions Precautions: Fall Precaution Comments: monitor SpO2 Restrictions Weight Bearing Restrictions: No    Mobility  Bed Mobility Overal bed mobility: Needs Assistance Bed Mobility: Rolling;Sidelying to Sit Rolling: Min assist Sidelying to sit: Mod assist;+2 for physical assistance;HOB elevated       General bed mobility comments: log roll technique cues; assist for trunk mobility to EOB    Transfers Overall transfer level: Needs assistance Equipment used:  Rolling walker (2 wheeled) Transfers: Sit to/from [employer] Sit to Stand: Min assist;+2 physical assistance;From elevated surface;+2 safety/equipment Stand pivot transfers: Mod assist;+2 physical assistance;+2 safety/equipment;From elevated surface       General transfer comment: Pt's bed elevated very high for x2 sit to stands and final transfer to recliner taking a few steps with near premature sitting due to weakness overall with modA +2 for stability.  Ambulation/Gait                 Stairs             Wheelchair Mobility    Modified Rankin (Stroke Patients Only)       Balance Overall balance assessment: Needs assistance Sitting-balance support: Bilateral upper extremity supported;Feet supported Sitting balance-Leahy Scale: Poor Sitting balance - Comments: minguardA due to lethargy   Standing balance support: Bilateral upper extremity supported;During functional activity Standing balance-Leahy Scale: Poor Standing balance comment: Reliant on UE support on RW and physical assistance.                            Cognition Arousal/Alertness: Lethargic;Suspect due to medications Behavior During Therapy: [facility] for tasks assessed/performed Overall Cognitive Status: Impaired/Different from baseline Area of Impairment: Attention;Following commands                   Current Attention Level: Focused   Following Commands: Follows one step commands inconsistently       General Comments: A&Ox4. Pt had just chest tube removed and received pain medications. Per spouse, pt is much more conversant, but pt very lethargic and unable to fully arouse for >10 secs at a time.  Exercises General Exercises - Lower Extremity Long Arc Quad: Both;10 reps;Seated    General Comments General comments (skin integrity, edema, etc.): Spouse in room, supportive. Interested in CIR.      Pertinent Vitals/Pain Pain Assessment: Faces Faces Pain  Scale: Hurts little more Pain Location: lower back Pain Descriptors / Indicators: Discomfort;Grimacing;Guarding;Sharp Pain Intervention(s): Monitored during session    Home Living Family/patient expects to be discharged to:: Private residence Living Arrangements: Spouse/significant other Available Help at Discharge: Family;Available 24 hours/day Type of Home: House Home Access: Stairs to enter Entrance Stairs-Rails: Right;Can reach both Home Layout: Two level;Able to live on main level with bedroom/bathroom Home Equipment: Shower seat;Bedside commode;Walker - 2 wheels;Cane - single point;Walker - 4 wheels;Other (comment);Toilet riser      Prior Function Level of Independence: Needs assistance  Gait / Transfers Assistance Needed: Was able to walk 50-100 ft without AD, used cane for longer distances, but uses RW on uneven surfaces. mod I with all mobility, including stairs. ADL's / Homemaking Assistance Needed: Wife assists in dressing and bathing bottom half. Wife does cooking and cleaning. Pt manages own meds and finances.     PT Goals (current goals can now be found in the care plan section) Acute Rehab PT Goals Patient Stated Goal: spouse wants pt to try for CIR Potential to Achieve Goals: Good Progress towards PT goals: Progressing toward goals    Frequency    Min 3X/week      PT Plan Discharge plan needs to be updated;Frequency needs to be updated    Co-evaluation PT/OT/SLP Co-Evaluation/Treatment: Yes Reason for Co-Treatment: For patient/therapist safety;To address functional/ADL transfers PT goals addressed during session: Mobility/safety with mobility        AM-PAC PT "6 Clicks" Mobility   Outcome Measure  Help needed turning from your back to your side while in a flat bed without using bedrails?: A Little Help needed moving from lying on your back to sitting on the side of a flat bed without using bedrails?: A Lot Help needed moving to and from a bed to a  chair (including a wheelchair)?: A Lot Help needed standing up from a chair using your arms (e.g., wheelchair or bedside chair)?: A Lot Help needed to walk in hospital room?: A Lot Help needed climbing 3-5 steps with a railing? : Total 6 Click Score: 12    End of Session Equipment Utilized During Treatment: Gait belt;Oxygen Activity Tolerance: Patient limited by fatigue Patient left: in chair;with call bell/phone within reach;with chair alarm set;with family/visitor present Nurse Communication: Mobility status PT Visit Diagnosis: Muscle weakness (generalized) (M62.81);Other abnormalities of gait and mobility (R26.89);Unsteadiness on feet (R26.81);Difficulty in walking, not elsewhere classified (R26.2);Pain     Time: 9381-0175 PT Time Calculation (min) (ACUTE ONLY): 28 min  Charges:  $Therapeutic Activity: 8-22 mins                     [name], PT, DPT Acute Rehabilitation Services Pager [phone] Office [phone]    [name] 01/24/2021, 5:00 PM

## 2021-01-24 NOTE — Progress Notes (Signed)
Pharmacy Antibiotic Note  RYLEE NUZUM is a 70 y.o. male admitted on 01/20/2021 as a transfer from [facility] with sepsis from back infection with osteo/discitis.  Pt had recent admission from 7/16-7/22 for sepsis secondary to abscess near pelvic hardware. Pt grew E coli from wound cx and several surgical cx and was discharged on cefazolin with plan for 6 wks of IV OPAT. Pt presented to [facility] on 7/25 evening with sepsis. Pharmacy has been consulted for cefepime dosing.  Per ID note on 7/29, if pleural fluid culture remains negative, can switch to Ancef.  Renal function remains stable, afebrile, WBC down to 16.5, chest tube removal.   Plan: Switch to Ancef 2gm IV Q8H Pharmacy will sign off with stable renal fxn.  Thank you for the consult!  Temp (24hrs), Avg:99.9 F (37.7 C), Min:98.5 F (36.9 C), Max:102.9 F (39.4 C)  Recent Labs  Lab 01/20/21 2039 01/20/21 2050 01/21/21 0025 01/24/21 0253  WBC 29.0*  --  23.2* 16.5*  CREATININE 0.81  --  0.86 0.81  LATICACIDVEN  --  1.3 0.8  --   VANCORANDOM 12  --   --   --      Estimated Creatinine Clearance: 110.2 mL/min (by C-G formula based on SCr of 0.81 mg/dL).    No Known Allergies  Cefepime 7/26 >> 7/30 Ancef 7/30 >>  Vanc 7/26 >> 7/28 OSH abx 7/25: Zosyn 3.375g x 2, Vanc 2500mg  x 1   7/17 Wound/abscess on back: pan-sensitive E coli 7/19 Surgical cx (from I&D): E coli 7/26 MRSA PCR - negative 7/26 pleural fluid - negative 7/26 BCx - NGTD 7/26 L pleural fluid - 7/27 UCx - negative 7/29 BCx -  Drelyn Pistilli D. 08-04-1982, PharmD, BCPS, BCCCP 01/24/2021, 1:48 PM

## 2021-01-24 NOTE — Evaluation (Signed)
Occupational Therapy Evaluation Patient Details Name: Marco Kennedy MRN: [id number] DOB: 1950-10-26 Today's Date: 01/24/2021    History of Present Illness Pt is a 70 y.o. male who presented 01/20/21 with SOB, increased weight gain, back pain, and fever. Of note, recent admission from 7/16-22 for sepsis secondary to moderate-sized abscess near the pelvic hardware. CT of the chest show empyema as well as discitis osteomyelitis of T10. S/p bil chest tube placement 7/26, L chest tube removed 7/29. PMH: back surgery with pelvis fixation in 03/2020, OSA on BIPAP, arthritis   Clinical Impression   Pt PTA: Pt was performing some ADL with assist, but attempting to be independent s/p previous surgeries. Pt limited by decreased arousal, decreased strength, decreased activity tolerance and decresed ability to care for self. Pt requiring O2 desatting to 85% on RA at rest. With 2L O2 >90% with exertion.  Pt modA+2 overall for mobility and transfers taking a few steps from bed to recliner. Pt set-upA to maxA for ADL. Pt would greatly benefit from continued OT skilled services. OT following acutely.    Follow Up Recommendations  CIR;Supervision/Assistance - 24 hour    Equipment Recommendations  3 in 1 bedside commode    Recommendations for Other Services Rehab consult     Precautions / Restrictions Precautions Precautions: Fall Precaution Comments: R chest tube; monitor SpO2 Restrictions Weight Bearing Restrictions: No      Mobility Bed Mobility Overal bed mobility: Needs Assistance Bed Mobility: Rolling;Sidelying to Sit Rolling: Min assist Sidelying to sit: Mod assist;+2 for physical assistance;HOB elevated       General bed mobility comments: log roll technique cues; assist for trunk motlilty to EOB    Transfers Overall transfer level: Needs assistance Equipment used: Rolling walker (2 wheeled) Transfers: Sit to/from [employer] Sit to Stand: Min assist;+2 physical  assistance;From elevated surface;+2 safety/equipment Stand pivot transfers: Mod assist;+2 physical assistance;+2 safety/equipment;From elevated surface       General transfer comment: Pt's bed elevated very high for x2 sit to stands and final transfer to recliner taking a few steps with near premature sitting due to weakness overall with modA +2 for stability.    Balance Overall balance assessment: Needs assistance Sitting-balance support: Bilateral upper extremity supported;Feet supported Sitting balance-Leahy Scale: Poor Sitting balance - Comments: minguardA due to lethargy   Standing balance support: Bilateral upper extremity supported;During functional activity Standing balance-Leahy Scale: Poor Standing balance comment: Reliant on UE support on RW and physical assistance.                           ADL either performed or assessed with clinical judgement   ADL Overall ADL's : Needs assistance/impaired Eating/Feeding: Set up;Sitting   Grooming: Minimal assistance Grooming Details (indicate cue type and reason): unable to attend to task with eyes open; left with set-upA for spouse to assise Upper Body Bathing: Minimal assistance;Sitting;Cueing for safety;Cueing for sequencing   Lower Body Bathing: Maximal assistance;Cueing for safety;Sitting/lateral leans;Sit to/from stand   Upper Body Dressing : Minimal assistance;Sitting   Lower Body Dressing: Maximal assistance;Cueing for safety;+2 for physical assistance;+2 for safety/equipment;Sitting/lateral leans;Sit to/from stand   Toilet Transfer: Moderate assistance;+2 for physical assistance;+2 for safety/equipment;Stand-pivot;Cueing for safety;Cueing for sequencing Toilet Transfer Details (indicate cue type and reason): Pt very fatigued and BLEs very weak for taking a few steps from bed to recliner Toileting- Clothing Manipulation and Hygiene: Maximal assistance;+2 for physical assistance;+2 for safety/equipment;Cueing for  safety;Cueing for sequencing;Sit to/from stand  Functional mobility during ADLs: Moderate assistance;+2 for physical assistance;+2 for safety/equipment;Cueing for safety;Cueing for sequencing General ADL Comments: Pt limited by decreased arousal, decreased strength, decreased activity tolerance and decresed ability to care for self. Pt requiring O2 desatting to 85% on RA at rest. With 2L O2 >90% with exertion.     Vision Baseline Vision/History: Wears glasses Wears Glasses: At all times Patient Visual Report: No change from baseline Additional Comments: able to read clock     Perception     Praxis      Pertinent Vitals/Pain Pain Assessment: Faces Faces Pain Scale: Hurts little more Pain Location: lower back Pain Descriptors / Indicators: Discomfort;Grimacing;Guarding;Sharp Pain Intervention(s): Monitored during session;Repositioned;Premedicated before session     Hand Dominance Right   Extremity/Trunk Assessment Upper Extremity Assessment Upper Extremity Assessment: Generalized weakness;RUE deficits/detail;LUE deficits/detail RUE Deficits / Details: edema, AROM, WFLs LUE Deficits / Details: edema, AROM, WFLs   Lower Extremity Assessment Lower Extremity Assessment: Defer to PT evaluation;LLE deficits/detail;Generalized weakness   Cervical / Trunk Assessment Cervical / Trunk Assessment: Kyphotic   Communication Communication Communication: No difficulties   Cognition Arousal/Alertness: Lethargic;Suspect due to medications Behavior During Therapy: [facility] for tasks assessed/performed Overall Cognitive Status: Within Functional Limits for tasks assessed                                 General Comments: Pt had just chest tube removed and received pain medications. Per spouse, pt is much more conversant, but pt very lethargic and unable to fully arouse for >10 secs at a time.   General Comments  Spouse in room, supportive. Interested in CIR.    Exercises      Shoulder Instructions      Home Living Family/patient expects to be discharged to:: Private residence Living Arrangements: Spouse/significant other Available Help at Discharge: Family;Available 24 hours/day Type of Home: House Home Access: Stairs to enter [employer] of Steps: approximately 14 total (6-7 steps followed by a walkway then 6-7 additional steps) Entrance Stairs-Rails: Right;Can reach both Home Layout: Two level;Able to live on main level with bedroom/bathroom Alternate Level Stairs-Number of Steps: flight   Bathroom Shower/Tub: Chief Strategy Officer: Handicapped height     Home Equipment: Shower seat;Bedside commode;Walker - 2 wheels;Cane - single point;Walker - 4 wheels;Other (comment);Toilet riser          Prior Functioning/Environment Level of Independence: Needs assistance  Gait / Transfers Assistance Needed: Was able to walk 50-100 ft without AD, used cane for longer distances, but uses RW on uneven surfaces. mod I with all mobility, including stairs. ADL's / Homemaking Assistance Needed: Wife assists in dressing and bathing bottom half. Wife does cooking and cleaning. Pt manages own meds and finances.            OT Problem List: Decreased strength;Decreased range of motion;Decreased activity tolerance;Impaired balance (sitting and/or standing);Decreased safety awareness;Cardiopulmonary status limiting activity;Decreased cognition;Decreased knowledge of use of DME or AE;Pain;Obesity;Increased edema      OT Treatment/Interventions: Self-care/ADL training;Therapeutic exercise;Energy conservation;DME and/or AE instruction;Therapeutic activities;Patient/family education;Balance training;Cognitive remediation/compensation    OT Goals(Current goals can be found in the care plan section) Acute Rehab OT Goals Patient Stated Goal: spouse wants pt to try for CIR OT Goal Formulation: With patient/family Time For Goal Achievement:  02/07/21 Potential to Achieve Goals: Good ADL Goals Pt Will Perform Grooming: with set-up;sitting Pt Will Transfer to Toilet: with min guard assist;stand pivot transfer;bedside commode Additional  ADL Goal #1: Pt will attend to x10 mins of ADL task with minimal cues in a minimally distracting environment. Additional ADL Goal #2: Pt will perform ADL tasks with minA and O2 >90% with exertion.  OT Frequency: Min 2X/week   Barriers to D/C:            Co-evaluation              AM-PAC OT "6 Clicks" Daily Activity     Outcome Measure Help from another person eating meals?: A Little Help from another person taking care of personal grooming?: A Little Help from another person toileting, which includes using toliet, bedpan, or urinal?: A Lot Help from another person bathing (including washing, rinsing, drying)?: A Lot Help from another person to put on and taking off regular upper body clothing?: A Little Help from another person to put on and taking off regular lower body clothing?: A Lot 6 Click Score: 15   End of Session Equipment Utilized During Treatment: Gait belt;Rolling walker;Oxygen Nurse Communication: Mobility status  Activity Tolerance: Patient limited by lethargy Patient left: in chair;with call bell/phone within reach;with chair alarm set;with family/visitor present  OT Visit Diagnosis: Unsteadiness on feet (R26.81);Other abnormalities of gait and mobility (R26.89);Muscle weakness (generalized) (M62.81);Pain;Other symptoms and signs involving cognitive function Pain - part of body:  (back and neck)                Time: 6629-4765 OT Time Calculation (min): 25 min Charges:  OT General Charges $OT Visit: 1 Visit OT Evaluation $OT Eval Moderate Complexity: 1 Mod  Flora Lipps, OTR/L Acute Rehabilitation Services Pager: [phone] Office: [phone]   Lonzo Cloud 01/24/2021, 3:59 PM

## 2021-01-24 NOTE — Progress Notes (Signed)
[facility] PROGRESS NOTE    Progress Note  Marco Kennedy  OZH:[id number] DOB: 1951/02/25 DOA: 01/20/2021 PCP: Marco Coss, MD     Brief Narrative:   Marco Kennedy is an 70 y.o. male past medical history of obstructive sleep apnea on CPAP recent discharge from the hospital 01/10/2021 for sepsis secondary to moderate-sized abscess near the pelvic hardware during this time neurosurgery IR and ID were involved in his care he underwent wound debridement on 01/13/2021 status post back surgery insertion of hardware, E. coli grew from wound, was treated with IV cefazolin and the plan was to continue 6 weeks of IV antibiotics via PICC line.  Went home on 01/16/2021 started complaining of rib pain he has gained 10 pounds since hospital discharge.  Since discharge she has been taking diclofenac twice a day for knee pain.  He started having fevers, transfer from [facility] with sepsis hypoxic which improved with 4 L of oxygen.  CT of the chest show empyema as well as discitis osteomyelitis of T10 with a white blood cell count of 10 he was started empirically on IV vancomycin and Ancef*chec with normal renal function.  Neurosurgery consulted in the ED   Assessment/Plan:   Sepsis secondary from discitis and osteomyelitis and bilateral empyema in the setting of hardware: New onset of fevers, repeat a CBC with differential tomorrow morning pleural fluid cultures fluids are negative till date.  Blood cultures continue to remain negative Continue iv cefepime, discontinue vancomycin. ID on board recommended to repeat CT of the chest early next week. 2D echo showed no wall motion abnormalities, no TEE at this point in time. ID also recommended to leave PICC line in place if culture negative. Neurosurgery was consulted who recommended no further hardware washout or removal.  Recommend an MRI in 3 to 4 weeks. He is status post I&D done 01/13/2021 with culture that grew E. coli.   Neurosurgery reevaluated the patient recommended no intervention.  Acute respiratory failure with hypoxia secondary to bilateral empyema: Pulmonary was consulted bilateral pigtail catheter pleural space cultures are negative till date. Further management of chest tube per pulmonary.  Anasarca: Likely due to chronic inflammation in the setting of hypoalbuminemia. Consult nutrition start him on Ensure 3 times daily.  Obstructive sleep apnea: Continue CPAP at night.  Constipation: On MiraLAX as needed.  Obesity: With a BMI of 42 counseling.  DVT prophylaxis: lovenox Family Communication:none Status is: Inpatient  Remains inpatient appropriate because:Hemodynamically unstable  Dispo: The patient is from: Home              Anticipated d/c is to: SNF              Patient currently is not medically stable to d/c.   Difficult to place patient No        Code Status:     Code Status Orders  (From admission, onward)           Start     Ordered   01/20/21 1741  Full code  Continuous        01/20/21 1749           Code Status History     Date Active Date Inactive Code Status Order ID Comments User Context   01/11/2021 0450 01/16/2021 2221 Full Code [id number]  Darlin Drop, DO Inpatient   04/18/2020 1425 05/08/2020 0124 Full Code [id number]  [name], MD Inpatient   06/15/2016 1801 06/16/2016 1832 Full Code [id number]  [name], PA-C Inpatient         IV Access:   Peripheral IV   Procedures and diagnostic studies:   DG CHEST PORT 1 VIEW  Result Date: 01/23/2021 CLINICAL DATA:  Pleural effusion. EXAM: PORTABLE CHEST 1 VIEW COMPARISON:  01/22/2021 FINDINGS: A right chest tube is unchanged. The left chest tube has been removed. A right PICC remains in place. The cardiac silhouette remains mildly enlarged. Bibasilar opacities are unchanged and likely represent a combination of small pleural effusions and atelectasis. No pneumothorax is identified.  IMPRESSION: Interval left chest tube removal. Unchanged small pleural effusions and bibasilar atelectasis. No pneumothorax. Electronically Signed   By: Sebastian Ache M.D.   On: 01/23/2021 08:23     Medical Consultants:   None.   Subjective:    Marco Kennedy relates he would like something to move his bowels.  Objective:    Vitals:   01/24/21 0425 01/24/21 0505 01/24/21 0603 01/24/21 0810  BP: (!) 144/67   (!) 152/62  Pulse: 82   83  Resp: (!) 21   19  Temp:   99 F (37.2 C) 99.6 F (37.6 C)  TempSrc:   Oral Oral  SpO2: 97%   95%  Weight:  130.4 kg     SpO2: 95 % O2 Flow Rate (L/min): 2 L/min   Intake/Output Summary (Last 24 hours) at 01/24/2021 0933 Last data filed at 01/24/2021 0421 Gross per 24 hour  Intake 780 ml  Output 770 ml  Net 10 ml    Filed Weights   01/22/21 0340 01/23/21 0429 01/24/21 0505  Weight: 131.5 kg 131.4 kg 130.4 kg    Exam: General exam: In no acute distress. Respiratory system: Good air movement and clear to auscultation. Cardiovascular system: S1 & S2 heard, RRR. No JVD. Gastrointestinal system: Abdomen is nondistended, soft and nontender.  Extremities: No pedal edema. Skin: No rashes, lesions or ulcers  Data Reviewed:    Labs: Basic Metabolic Panel: Recent Labs  Lab 01/20/21 2039 01/21/21 0025 01/24/21 0253  NA 134* 134* 129*  K 4.2 4.1 4.6  CL 99 98 92*  CO2 28 29 29   GLUCOSE 162* 152* 184*  BUN 20 18 19   CREATININE 0.81 0.86 0.81  CALCIUM 7.9* 7.6* 7.6*    GFR Estimated Creatinine Clearance: 110.2 mL/min (by C-G formula based on SCr of 0.81 mg/dL). Liver Function Tests: Recent Labs  Lab 01/20/21 2039  AST 37  ALT 13  ALKPHOS 92  BILITOT 0.5  PROT 5.1*  ALBUMIN 1.7*    No results for input(s): LIPASE, AMYLASE in the last 168 hours. No results for input(s): AMMONIA in the last 168 hours. Coagulation profile No results for input(s): INR, PROTIME in the last 168 hours. COVID-19 Labs  No results for  input(s): DDIMER, FERRITIN, LDH, CRP in the last 72 hours.   Lab Results  Component Value Date   SARSCOV2NAA NEGATIVE 01/13/2021   SARSCOV2NAA NEGATIVE 05/07/2020   SARSCOV2NAA NEGATIVE 04/15/2020    CBC: Recent Labs  Lab 01/20/21 2039 01/21/21 0025 01/24/21 0253  WBC 29.0* 23.2* 16.5*  NEUTROABS 26.2*  --  13.9*  HGB 8.9* 9.7* 9.9*  HCT 28.4* 30.3* 30.2*  MCV 88.5 88.1 86.8  PLT 594* 475* 380    Cardiac Enzymes: No results for input(s): CKTOTAL, CKMB, CKMBINDEX, TROPONINI in the last 168 hours. BNP (last 3 results) No results for input(s): PROBNP in the last 8760 hours. CBG: No results for input(s): GLUCAP in the last 168 hours.  D-Dimer: No results for input(s): DDIMER in the last 72 hours. Hgb A1c: No results for input(s): HGBA1C in the last 72 hours. Lipid Profile: No results for input(s): CHOL, HDL, LDLCALC, TRIG, CHOLHDL, LDLDIRECT in the last 72 hours. Thyroid function studies: No results for input(s): TSH, T4TOTAL, T3FREE, THYROIDAB in the last 72 hours.  Invalid input(s): FREET3 Anemia work up: No results for input(s): VITAMINB12, FOLATE, FERRITIN, TIBC, IRON, RETICCTPCT in the last 72 hours. Sepsis Labs: Recent Labs  Lab 01/20/21 2039 01/20/21 2050 01/21/21 0025 01/24/21 0253  PROCALCITON 1.01  --   --   --   WBC 29.0*  --  23.2* 16.5*  LATICACIDVEN  --  1.3 0.8  --     Microbiology Recent Results (from the past 240 hour(s))  MRSA Next Gen by PCR, Nasal     Status: None   Collection Time: 01/20/21  5:07 PM   Specimen: Nasal Mucosa; Nasal Swab  Result Value Ref Range Status   MRSA by PCR Next Gen NOT DETECTED NOT DETECTED Final    Comment: (NOTE) The GeneXpert MRSA Assay (FDA approved for NASAL specimens only), is one component of a comprehensive MRSA colonization surveillance program. It is not intended to diagnose MRSA infection nor to guide or monitor treatment for MRSA infections. Test performance is not FDA approved in patients less than  799 years old. Performed at [facility], [address] [place], [place] [address]   Culture, body fluid w Gram Stain-bottle     Status: None (Preliminary result)   Collection Time: 01/20/21  6:30 PM   Specimen: Fluid  Result Value Ref Range Status   Specimen Description FLUID LEFT PLEURAL  Final   Special Requests   Final    BOTTLES DRAWN AEROBIC AND ANAEROBIC Blood Culture adequate volume   Culture   Final    NO GROWTH 2 DAYS Performed at [facility], [address] [place], [place] [address]    Report Status PENDING  Incomplete  Gram stain     Status: None   Collection Time: 01/20/21  6:30 PM   Specimen: Fluid  Result Value Ref Range Status   Specimen Description FLUID LEFT PLEURAL  Final   Special Requests NONE  Final   Gram Stain   Final    MODERATE WBC PRESENT, PREDOMINANTLY PMN NO ORGANISMS SEEN Performed at [facility], [address] [place], [place] [address]    Report Status 01/21/2021 FINAL  Final  Culture, body fluid w Gram Stain-bottle     Status: None (Preliminary result)   Collection Time: 01/20/21  6:30 PM   Specimen: Fluid  Result Value Ref Range Status   Specimen Description FLUID RIGHT PLEURAL  Final   Special Requests   Final    BOTTLES DRAWN AEROBIC AND ANAEROBIC Blood Culture adequate volume   Culture   Final    NO GROWTH 2 DAYS Performed at [facility], [address] [place], [place] [address]    Report Status PENDING  Incomplete  Gram stain     Status: None   Collection Time: 01/20/21  6:30 PM   Specimen: Fluid  Result Value Ref Range Status   Specimen Description FLUID RIGHT PLEURAL  Final   Special Requests NONE  Final   Gram Stain   Final    ABUNDANT WBC PRESENT, PREDOMINANTLY PMN NO ORGANISMS SEEN Performed at [facility], [address] [place], [place] [address]    Report  Status 01/21/2021 FINAL  Final  Culture, blood (routine x 2)     Status: None (Preliminary result)   Collection Time:  01/20/21  8:39 PM   Specimen: BLOOD LEFT ARM  Result Value Ref Range Status   Specimen Description BLOOD LEFT ARM  Final   Special Requests   Final    BOTTLES DRAWN AEROBIC AND ANAEROBIC Blood Culture adequate volume   Culture   Final    NO GROWTH 2 DAYS Performed at [facility], [address] [place], [place] [address]    Report Status PENDING  Incomplete  Culture, blood (routine x 2)     Status: None (Preliminary result)   Collection Time: 01/20/21  8:39 PM   Specimen: BLOOD LEFT HAND  Result Value Ref Range Status   Specimen Description BLOOD LEFT HAND  Final   Special Requests   Final    BOTTLES DRAWN AEROBIC AND ANAEROBIC Blood Culture adequate volume   Culture   Final    NO GROWTH 2 DAYS Performed at [facility], [address] [place], [place] [address]    Report Status PENDING  Incomplete  Urine Culture     Status: None   Collection Time: 01/21/21  7:31 AM   Specimen: Urine, Clean Catch  Result Value Ref Range Status   Specimen Description URINE, CLEAN CATCH  Final   Special Requests NONE  Final   Culture   Final    NO GROWTH Performed at [facility], [address] [place], [place] [address]    Report Status 01/22/2021 FINAL  Final     Medications:    Chlorhexidine Gluconate Cloth  6 each Topical Daily   docusate sodium  100 mg Oral BID   enoxaparin (LOVENOX) injection  40 mg Subcutaneous Daily   feeding supplement  237 mL Oral TID BM   multivitamin with minerals  1 tablet Oral Daily   sodium chloride flush  10-40 mL Intracatheter Q12H   sodium chloride flush  3 mL Intravenous Q12H   Continuous Infusions:  ceFEPime (MAXIPIME) IV 2 g (01/24/21 0421)      LOS: 4 days   Marinda Elk  [facility]  01/24/2021, 9:33 AM

## 2021-01-25 DIAGNOSIS — J90 Pleural effusion, not elsewhere classified: Secondary | ICD-10-CM | POA: Diagnosis not present

## 2021-01-25 DIAGNOSIS — A419 Sepsis, unspecified organism: Secondary | ICD-10-CM | POA: Diagnosis not present

## 2021-01-25 DIAGNOSIS — T847XXD Infection and inflammatory reaction due to other internal orthopedic prosthetic devices, implants and grafts, subsequent encounter: Secondary | ICD-10-CM | POA: Diagnosis not present

## 2021-01-25 DIAGNOSIS — Z9689 Presence of other specified functional implants: Secondary | ICD-10-CM | POA: Diagnosis not present

## 2021-01-25 DIAGNOSIS — J869 Pyothorax without fistula: Secondary | ICD-10-CM | POA: Diagnosis not present

## 2021-01-25 DIAGNOSIS — M4620 Osteomyelitis of vertebra, site unspecified: Secondary | ICD-10-CM | POA: Diagnosis not present

## 2021-01-25 DIAGNOSIS — G4733 Obstructive sleep apnea (adult) (pediatric): Secondary | ICD-10-CM | POA: Diagnosis not present

## 2021-01-25 DIAGNOSIS — J189 Pneumonia, unspecified organism: Secondary | ICD-10-CM | POA: Diagnosis not present

## 2021-01-25 DIAGNOSIS — M8609 Acute hematogenous osteomyelitis, multiple sites: Secondary | ICD-10-CM | POA: Diagnosis not present

## 2021-01-25 LAB — CULTURE, BODY FLUID W GRAM STAIN -BOTTLE
Culture: NO GROWTH
Culture: NO GROWTH
Special Requests: ADEQUATE
Special Requests: ADEQUATE

## 2021-01-25 LAB — CULTURE, BLOOD (ROUTINE X 2)
Culture: NO GROWTH
Culture: NO GROWTH
Special Requests: ADEQUATE
Special Requests: ADEQUATE

## 2021-01-25 MED ORDER — FUROSEMIDE 10 MG/ML IJ SOLN
40.0000 mg | Freq: Two times a day (BID) | INTRAMUSCULAR | Status: AC
  Administered 2021-01-25 (×2): 40 mg via INTRAVENOUS
  Filled 2021-01-25 (×2): qty 4

## 2021-01-25 NOTE — Progress Notes (Signed)
Attempted to wean patient off oxygen, as saturations on 1L/[place] were WNL.  However, at rest on RA, patient's oxygen levels dropped to 86-88%.  Oxygen reapplied at 1L/[place], oxygen saturations improved as noted.    01/25/21 1516 01/25/21 1620 01/25/21 1625  Oxygen Therapy  SpO2 93 % (!) 88 % (!) 86 %  O2 Device Nasal Cannula Room Air Room Air  O2 Flow Rate (L/min) 1 L/min  --   --     01/25/21 1630  Oxygen Therapy  SpO2 92 %  O2 Device Nasal Cannula  O2 Flow Rate (L/min) 1 L/min

## 2021-01-25 NOTE — Progress Notes (Signed)
Inpatient Rehab Admissions Coordinator Note:   Per therapy recommendations, pt was screened for CIR candidacy by Shanica Castellanos, MS CCC-SLP. At this time, Pt. Appears to have functional decline and is a potential  candidate for CIR. Will place order for rehab consult per protocol.  Please contact me with questions.   Kamin Niblack, MS, CCC-SLP Rehab Admissions Coordinator  [phone] (celll) [phone] (office)  

## 2021-01-25 NOTE — Plan of Care (Signed)

## 2021-01-25 NOTE — Progress Notes (Addendum)
[facility] PROGRESS NOTE    Progress Note  VUONG MUSA  MVH:[id number] DOB: 1950/11/20 DOA: 01/20/2021 PCP: Maximiano Coss, MD     Brief Narrative:   Marco Kennedy is an 70 y.o. male past medical history of obstructive sleep apnea on CPAP recent discharge from the hospital 01/10/2021 for sepsis secondary to moderate-sized abscess near the pelvic hardware during this time neurosurgery IR and ID were involved in his care he underwent wound debridement on 01/13/2021 status post back surgery insertion of hardware, E. coli grew from wound, was treated with IV cefazolin and the plan was to continue 6 weeks of IV antibiotics via PICC line.  Went home on 01/16/2021 started complaining of rib pain he has gained 10 pounds since hospital discharge.  Since discharge she has been taking diclofenac twice a day for knee pain.  He started having fevers, transfer from [facility] with sepsis hypoxic which improved with 4 L of oxygen.  CT of the chest show empyema as well as discitis osteomyelitis of T10 with a white blood cell count of 10 he was started empirically on IV vancomycin and Ancef*chec with normal renal function.  Neurosurgery consulted in the ED   Assessment/Plan:   Sepsis secondary from discitis and osteomyelitis and bilateral empyema in the setting of hardware: He has defervesced leukocytosis improved.  Blood cultures remain negative till date. Continue IV continue IV cefazolin ID on board recommended to repeat CT of the chest early next week. 2D echo showed no wall motion abnormalities, no TEE at this point in time. ID also recommended to leave PICC line in place if culture negative. Neurosurgery was consulted who recommended no further hardware washout or removal.  Recommend an MRI in 3 to 4 weeks. He is status post I&D done 01/13/2021 with culture that grew E. coli.  Neurosurgery reevaluated the patient recommended no intervention. Physical therapy reevaluated the  patient and recommended inpatient rehab  Acute respiratory failure with hypoxia secondary to bilateral empyema: Pulmonary was consulted bilateral pigtail catheter pleural space placed, cultures are negative till date. Both chest tube has been discontinued.  Repeat  CT of the chest on 01/28/2021. Continue antibiotics per infectious disease.  Anasarca: Likely due to chronic inflammation in the setting of hypoalbuminemia. Consult nutrition start him on Ensure 3 times daily. Ahead and start him on Lasix for 24 hours restrict fluid intake.  Obstructive sleep apnea: Continue CPAP at night.  Constipation: On MiraLAX as needed.  Obesity: With a BMI of 42 counseling.  DVT prophylaxis: lovenox Family Communication:none Status is: Inpatient  Remains inpatient appropriate because:Hemodynamically unstable  Dispo: The patient is from: Home              Anticipated d/c is to: SNF              Patient currently is not medically stable to d/c.   Difficult to place patient No        Code Status:     Code Status Orders  (From admission, onward)           Start     Ordered   01/20/21 1741  Full code  Continuous        01/20/21 1749           Code Status History     Date Active Date Inactive Code Status Order ID Comments User Context   01/11/2021 0450 01/16/2021 2221 Full Code [id number]  Darlin Drop, DO Inpatient   04/18/2020  1425 05/08/2020 0124 Full Code [id number]  [name], MD Inpatient   06/15/2016 1801 06/16/2016 1832 Full Code [id number]  [name], PA-C Inpatient         IV Access:   Peripheral IV   Procedures and diagnostic studies:   No results found.   Medical Consultants:   None.   Subjective:    Marco Kennedy no new complaints feels great  Objective:    Vitals:   01/25/21 0309 01/25/21 0412 01/25/21 0416 01/25/21 0729  BP: 118/63   (!) 118/49  Pulse: 95   86  Resp: 18 19  17   Temp: 98.8 F (37.1 C)   98.1 F (36.7  C)  TempSrc: Axillary   Axillary  SpO2: 93% 91% 93% 94%  Weight:       SpO2: 94 % O2 Flow Rate (L/min): 1 L/min   Intake/Output Summary (Last 24 hours) at 01/25/2021 0933 Last data filed at 01/25/2021 0654 Gross per 24 hour  Intake 560 ml  Output 1000 ml  Net -440 ml    Filed Weights   01/23/21 0429 01/24/21 0505 01/25/21 0221  Weight: 131.4 kg 130.4 kg 130.2 kg    Exam: General exam: In no acute distress. Respiratory system: Good air movement and clear to auscultation. Cardiovascular system: S1 & S2 heard, RRR. No JVD. Gastrointestinal system: Abdomen is nondistended, soft and nontender.  Extremities: No pedal edema. Skin: No rashes, lesions or ulcers  Data Reviewed:    Labs: Basic Metabolic Panel: Recent Labs  Lab 01/20/21 2039 01/21/21 0025 01/24/21 0253  NA 134* 134* 129*  K 4.2 4.1 4.6  CL 99 98 92*  CO2 28 29 29   GLUCOSE 162* 152* 184*  BUN 20 18 19   CREATININE 0.81 0.86 0.81  CALCIUM 7.9* 7.6* 7.6*    GFR Estimated Creatinine Clearance: 110.1 mL/min (by C-G formula based on SCr of 0.81 mg/dL). Liver Function Tests: Recent Labs  Lab 01/20/21 2039  AST 37  ALT 13  ALKPHOS 92  BILITOT 0.5  PROT 5.1*  ALBUMIN 1.7*    No results for input(s): LIPASE, AMYLASE in the last 168 hours. No results for input(s): AMMONIA in the last 168 hours. Coagulation profile No results for input(s): INR, PROTIME in the last 168 hours. COVID-19 Labs  No results for input(s): DDIMER, FERRITIN, LDH, CRP in the last 72 hours.   Lab Results  Component Value Date   SARSCOV2NAA NEGATIVE 01/13/2021   SARSCOV2NAA NEGATIVE 05/07/2020   SARSCOV2NAA NEGATIVE 04/15/2020    CBC: Recent Labs  Lab 01/20/21 2039 01/21/21 0025 01/24/21 0253  WBC 29.0* 23.2* 16.5*  NEUTROABS 26.2*  --  13.9*  HGB 8.9* 9.7* 9.9*  HCT 28.4* 30.3* 30.2*  MCV 88.5 88.1 86.8  PLT 594* 475* 380    Cardiac Enzymes: No results for input(s): CKTOTAL, CKMB, CKMBINDEX, TROPONINI in the  last 168 hours. BNP (last 3 results) No results for input(s): PROBNP in the last 8760 hours. CBG: No results for input(s): GLUCAP in the last 168 hours. D-Dimer: No results for input(s): DDIMER in the last 72 hours. Hgb A1c: No results for input(s): HGBA1C in the last 72 hours. Lipid Profile: No results for input(s): CHOL, HDL, LDLCALC, TRIG, CHOLHDL, LDLDIRECT in the last 72 hours. Thyroid function studies: No results for input(s): TSH, T4TOTAL, T3FREE, THYROIDAB in the last 72 hours.  Invalid input(s): FREET3 Anemia work up: No results for input(s): VITAMINB12, FOLATE, FERRITIN, TIBC, IRON, RETICCTPCT in the last 72 hours. Sepsis Labs: Recent  Labs  Lab 01/20/21 2039 01/20/21 2050 01/21/21 0025 01/24/21 0253  PROCALCITON 1.01  --   --   --   WBC 29.0*  --  23.2* 16.5*  LATICACIDVEN  --  1.3 0.8  --     Microbiology Recent Results (from the past 240 hour(s))  MRSA Next Gen by PCR, Nasal     Status: None   Collection Time: 01/20/21  5:07 PM   Specimen: Nasal Mucosa; Nasal Swab  Result Value Ref Range Status   MRSA by PCR Next Gen NOT DETECTED NOT DETECTED Final    Comment: (NOTE) The GeneXpert MRSA Assay (FDA approved for NASAL specimens only), is one component of a comprehensive MRSA colonization surveillance program. It is not intended to diagnose MRSA infection nor to guide or monitor treatment for MRSA infections. Test performance is not FDA approved in patients less than 50 years old. Performed at [facility], [address], [place], [place] [address]   Culture, body fluid w Gram Stain-bottle     Status: None (Preliminary result)   Collection Time: 01/20/21  6:30 PM   Specimen: Fluid  Result Value Ref Range Status   Specimen Description FLUID LEFT PLEURAL  Final   Special Requests   Final    BOTTLES DRAWN AEROBIC AND ANAEROBIC Blood Culture adequate volume   Culture   Final    NO GROWTH 4 DAYS Performed at [facility], [address],  [place], [place] [address]    Report Status PENDING  Incomplete  Gram stain     Status: None   Collection Time: 01/20/21  6:30 PM   Specimen: Fluid  Result Value Ref Range Status   Specimen Description FLUID LEFT PLEURAL  Final   Special Requests NONE  Final   Gram Stain   Final    MODERATE WBC PRESENT, PREDOMINANTLY PMN NO ORGANISMS SEEN Performed at [facility], [address] [place], [place] [address]    Report Status 01/21/2021 FINAL  Final  Culture, body fluid w Gram Stain-bottle     Status: None (Preliminary result)   Collection Time: 01/20/21  6:30 PM   Specimen: Fluid  Result Value Ref Range Status   Specimen Description FLUID RIGHT PLEURAL  Final   Special Requests   Final    BOTTLES DRAWN AEROBIC AND ANAEROBIC Blood Culture adequate volume   Culture   Final    NO GROWTH 4 DAYS Performed at [facility], [address], [place], [place] [address]    Report Status PENDING  Incomplete  Gram stain     Status: None   Collection Time: 01/20/21  6:30 PM   Specimen: Fluid  Result Value Ref Range Status   Specimen Description FLUID RIGHT PLEURAL  Final   Special Requests NONE  Final   Gram Stain   Final    ABUNDANT WBC PRESENT, PREDOMINANTLY PMN NO ORGANISMS SEEN Performed at [facility], [address], [place], [place] [address]    Report Status 01/21/2021 FINAL  Final  Culture, blood (routine x 2)     Status: None (Preliminary result)   Collection Time: 01/20/21  8:39 PM   Specimen: BLOOD LEFT ARM  Result Value Ref Range Status   Specimen Description BLOOD LEFT ARM  Final   Special Requests   Final    BOTTLES DRAWN AEROBIC AND ANAEROBIC Blood Culture adequate volume   Culture   Final    NO GROWTH 4 DAYS Performed  at [facility], [address] [place], [place] [address]    Report Status PENDING  Incomplete  Culture, blood (routine x 2)     Status: None (Preliminary result)   Collection Time: 01/20/21  8:39 PM   Specimen: BLOOD LEFT HAND   Result Value Ref Range Status   Specimen Description BLOOD LEFT HAND  Final   Special Requests   Final    BOTTLES DRAWN AEROBIC AND ANAEROBIC Blood Culture adequate volume   Culture   Final    NO GROWTH 4 DAYS Performed at [facility], [address] [place], [place] [address]    Report Status PENDING  Incomplete  Urine Culture     Status: None   Collection Time: 01/21/21  7:31 AM   Specimen: Urine, Clean Catch  Result Value Ref Range Status   Specimen Description URINE, CLEAN CATCH  Final   Special Requests NONE  Final   Culture   Final    NO GROWTH Performed at [facility], [address] [place], [place] [address]    Report Status 01/22/2021 FINAL  Final  Culture, blood (routine x 2)     Status: None (Preliminary result)   Collection Time: 01/23/21  9:52 PM   Specimen: BLOOD  Result Value Ref Range Status   Specimen Description BLOOD LEFT ANTECUBITAL  Final   Special Requests   Final    BOTTLES DRAWN AEROBIC AND ANAEROBIC Blood Culture adequate volume   Culture   Final    NO GROWTH < 24 HOURS Performed at [facility], [address] [place], [place] [address]    Report Status PENDING  Incomplete  Culture, blood (routine x 2)     Status: None (Preliminary result)   Collection Time: 01/23/21  9:52 PM   Specimen: BLOOD  Result Value Ref Range Status   Specimen Description BLOOD LEFT ANTECUBITAL  Final   Special Requests   Final    BOTTLES DRAWN AEROBIC AND ANAEROBIC Blood Culture adequate volume   Culture   Final    NO GROWTH < 24 HOURS Performed at [facility], [address] [place], [place] [address]    Report Status PENDING  Incomplete     Medications:    Chlorhexidine Gluconate Cloth  6 each Topical Daily   docusate sodium  100 mg Oral BID   enoxaparin (LOVENOX) injection  40 mg Subcutaneous Daily   feeding supplement  237 mL Oral TID BM   multivitamin with minerals  1 tablet Oral Daily   polyethylene glycol  17 g Oral BID    sodium chloride flush  10-40 mL Intracatheter Q12H   sodium chloride flush  3 mL Intravenous Q12H   Continuous Infusions:   ceFAZolin (ANCEF) IV 2 g (01/25/21 0402)      LOS: 5 days   Marinda ElkAbraham Feliz Ortiz  [facility]  01/25/2021, 9:33 AM

## 2021-01-25 NOTE — Progress Notes (Signed)
   NAME:  Marco Kennedy, MRN:  [id number], DOB:  11/17/1950, LOS: 5 ADMISSION DATE:  01/20/2021, CONSULTATION DATE:  01/20/21 REFERRING MD:  Ophelia Charter, CHIEF COMPLAINT:  SOB   History of Present Illness:  70 year old man with hx of OSA on CPAP, recent lumbar hardware infection, epidural abscess who was Dc'd on prolonged IV antibiotics on 7/16 presenting with progressive SOB, chest pain, fevers, chills, and shoulder discomfort.  Imaging at [facility] ED revealed continued infection in thoracolumbar areas as well as new likely spread to pleura.  PCCM consulted to help manage pleural infection.  Pertinent  Medical History  OSA Pelvic hardware infection detailed in DC summary 7/22  Significant Hospital Events:  7/26 admitted from [facility] ED 7/27 changed to water seal. ID seeing, NSGY seeing 7/28 improved pleuritic pain, continues on water seal.  Still awaiting pleural cx. Left chest tube removed  7/29 200cc in 24 hours in the right tube 7/30 70cc in the last 24 hours in the right tube, chest tube fulled  Interim History / Subjective:  No overnight issues. Upset about being on low na diet and fluid restriction On 1L to room air.  Objective    Vitals:   01/25/21 1129 01/25/21 1516  BP: (!) 117/57 132/62  Pulse: 75 90  Resp: (!) 24 (!) 22  Temp: 98.2 F (36.8 C) 98.7 F (37.1 C)  SpO2: 95% 93%    General: Pleasant elderly male lying in bed in NAD HEENT: [place]/AT, MM pink/moist, PERRL,  Neuro: Alert and oriented x3, non-focal  CV: RRR no mrg PULM:  Clear to ascultation with diminished bilaterally, no increased work of breathing, right chest tube in place GI: obese, soft Extremities: RUE PICC in place, mild edema bilateral upper and lower extremities Skin: no rashes or lesions   Resolved Hospital Problem list   Acute hypoxemic respiratory failure  Assessment & Plan:   Bilateral parapneumonic pleural effusions s/p chest tube drainage OSA on CPAP 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  Agree with Plan for CT Chest later this week - we will follow for results.  Continue CPAP, he is managed by a pulmonary/sleep physician in [place] for this.    Signature:   Durel Salts, MD Pulmonary and Critical Care Medicine [facility]

## 2021-01-25 NOTE — Progress Notes (Signed)
Pt assisted with home CPAP. 3L bleed in, what pt uses at home.

## 2021-01-25 NOTE — Progress Notes (Signed)
   01/25/21 2019  Assess: MEWS Score  Temp (!) 102.5 F (39.2 C)  BP 136/63  Pulse Rate (!) 105  ECG Heart Rate (!) 105  Resp (!) 29  SpO2 91 %  O2 Device Nasal Cannula  Assess: MEWS Score  MEWS Temp 2  MEWS Systolic 0  MEWS Pulse 1  MEWS RR 2  MEWS LOC 0  MEWS Score 5  MEWS Score Color Red  Assess: if the MEWS score is Yellow or Red  Were vital signs taken at a resting state? Yes  Focused Assessment Change from prior assessment (see assessment flowsheet)  Early Detection of Sepsis Score *See Row Information* Low  MEWS guidelines implemented *See Row Information* Yes  Treat  MEWS Interventions Administered prn meds/treatments;Administered scheduled meds/treatments;Escalated (See documentation below)  Take Vital Signs  Increase Vital Sign Frequency  Red: Q 1hr X 4 then Q 4hr X 4, if remains red, continue Q 4hrs  Escalate  MEWS: Escalate Red: discuss with charge nurse/RN and provider, consider discussing with RRT  Notify: Charge Nurse/RN  Name of Charge Nurse/RN Notified gladys, rn  Date Charge Nurse/RN Notified 01/25/21  Time Charge Nurse/RN Notified 2044  Notify: Provider  Provider Name/Title Anna Genre, NP  Date Provider Notified 01/25/21  Time Provider Notified 2047  Notification Type Page  Notification Reason Critical result  Provider response No new orders  Date of Provider Response 01/25/21  Time of Provider Response 2110

## 2021-01-25 NOTE — Progress Notes (Signed)
[facility]  Date of Admission:  01/20/2021     CC: Hardware associated thoracic vertebral om/epidural absess   Lines:  Right ue picc from 7/16 admission   Abx: 7/30-c cefazolin  7/26-30 cefepime 7/26-7/28 vanc Home iv cefazolin                                                          Assessment: 70 yo male previous thoracolumbar hardware/back surgery 03/2020 complicated by thoracic vertebral om/epidural abscess s/p recent I&D 7/19 with cx ecoli (bcx negative at that time), readmitted a few days after discharged on 7/22 for fluid overload and bilateral pleural effusion and fever, s/p bilateral chest tube placement with effusion appearing exudative     #exudative bilateral pleural effusion #fluid overload Effusion appear exudative. Cx in progress. Bcx also in progress Previous admission without effusion or respiratory sx. No hx chf Rather atypical We know he has extensive thoracic spine infection so query spill over from prevertebral area with contiguous pleural involvement (that too would be atypical given the extent of effusion) Rather soon for PICC infection and IE with hematogenous spread involving the pleural space  7/16 and 7/26 bcx ngtd As of 7/29 pleural fluid cx ngtd -- suspect again parapneumonic process contiguous extension of thoracic vertebral OM 7/27 tte no evidence endocarditis S/p left chest tube removal 7/29 and right chest tube removal 7/30 Fever curve improving Abx changed back to cefazolin   -continue cefazolin -will repeat chest ct, along with abd/pelv 8/01 (would like to do abd/pelv too as rather unexpected chest process from just vertebral OM, and would like to not miss intraabdominal extension as well) -discussed with primary team      #hardware associated thoracic vertebral om/epidural abscess S/p I&D 7/19. Cx ecoli Patient unclear if back pain is worse in setting of chest pain/fluid  overload-sob Sepsis/fever while on cefazolin ?pleural process vs back process NSG team reevaluated patient --> no neurological deficit so no planned urgent repeat imaging  -agree with NSG repeat imaging thoraco/lumbar mri need in around 3 weeks -abx plan as above      Principal Problem:   Sepsis due to undetermined organism [facility](HCC) Active Problems:   Vertebral osteomyelitis (HCC)   Hardware complicating wound infection (HCC)   Pleural effusion   Anasarca   OSA (obstructive sleep apnea)   Obesity, Class III, BMI 40-49.9 (morbid obesity) (HCC)   No Known Allergies  Scheduled Meds:  Chlorhexidine Gluconate Cloth  6 each Topical Daily   docusate sodium  100 mg Oral BID   enoxaparin (LOVENOX) injection  40 mg Subcutaneous Daily   feeding supplement  237 mL Oral TID BM   furosemide  40 mg Intravenous Q12H   multivitamin with minerals  1 tablet Oral Daily   polyethylene glycol  17 g Oral BID   sodium chloride flush  10-40 mL Intracatheter Q12H   sodium chloride flush  3 mL Intravenous Q12H   Continuous Infusions:   ceFAZolin (ANCEF) IV 2 g (01/25/21 1305)   PRN Meds:.acetaminophen **OR** acetaminophen, dextromethorphan, morphine injection, ondansetron **OR** ondansetron (ZOFRAN) IV, oxyCODONE, polyethylene glycol, sodium chloride flush   SUBJECTIVE: Right chest tube removed Still moderate bilateral LE edema Back pain moderate-severe if trying to move/getup to chair No n/v/diarrhea No rash  Review of Systems: ROS All other ROS was negative, except mentioned above     OBJECTIVE: Vitals:   01/25/21 0416 01/25/21 0729 01/25/21 1129 01/25/21 1516  BP:  (!) 118/49 (!) 117/57 132/62  Pulse:  86 75 90  Resp:  17 (!) 24 (!) 22  Temp:  98.1 F (36.7 C) 98.2 F (36.8 C) 98.7 F (37.1 C)  TempSrc:  Axillary Oral Oral  SpO2: 93% 94% 95% 93%  Weight:       Body mass index is 44.96 kg/m.  Physical Exam General/constitutional: no distress, pleasant HEENT:  Normocephalic, PER, Conj Clear, EOMI, Oropharynx clear Neck supple CV: rrr no mrg Lungs: clear to auscultation, normal respiratory effort Abd: Soft, Nontender Ext: bilateral 1+ edema in LE Skin: No Rash Neuro: nonfocal MSK: no peripheral joint swelling/tenderness/warmth; back spines nontender    Central line presence: RUE picc site no purulence/erythema   Lab Results Lab Results  Component Value Date   WBC 16.5 (H) 01/24/2021   HGB 9.9 (L) 01/24/2021   HCT 30.2 (L) 01/24/2021   MCV 86.8 01/24/2021   PLT 380 01/24/2021    Lab Results  Component Value Date   CREATININE 0.81 01/24/2021   BUN 19 01/24/2021   NA 129 (L) 01/24/2021   K 4.6 01/24/2021   CL 92 (L) 01/24/2021   CO2 29 01/24/2021    Lab Results  Component Value Date   ALT 13 01/20/2021   AST 37 01/20/2021   ALKPHOS 92 01/20/2021   BILITOT 0.5 01/20/2021      Microbiology: Recent Results (from the past 240 hour(s))  MRSA Next Gen by PCR, Nasal     Status: None   Collection Time: 01/20/21  5:07 PM   Specimen: Nasal Mucosa; Nasal Swab  Result Value Ref Range Status   MRSA by PCR Next Gen NOT DETECTED NOT DETECTED Final    Comment: (NOTE) The GeneXpert MRSA Assay (FDA approved for NASAL specimens only), is one component of a comprehensive MRSA colonization surveillance program. It is not intended to diagnose MRSA infection nor to guide or monitor treatment for MRSA infections. Test performance is not FDA approved in patients less than 31 years old. Performed at [facility], [address] [place], [place] [address]   Culture, body fluid w Gram Stain-bottle     Status: None   Collection Time: 01/20/21  6:30 PM   Specimen: Fluid  Result Value Ref Range Status   Specimen Description FLUID LEFT PLEURAL  Final   Special Requests   Final    BOTTLES DRAWN AEROBIC AND ANAEROBIC Blood Culture adequate volume   Culture   Final    NO GROWTH 5 DAYS Performed at [facility], [address], [place], [place] [address]    Report Status 01/25/2021 FINAL  Final  Gram stain     Status: None   Collection Time: 01/20/21  6:30 PM   Specimen: Fluid  Result Value Ref Range Status   Specimen Description FLUID LEFT PLEURAL  Final   Special Requests NONE  Final   Gram Stain   Final    MODERATE WBC PRESENT, PREDOMINANTLY PMN NO ORGANISMS SEEN Performed at [facility], [address] [place], [place] [address]    Report Status 01/21/2021 FINAL  Final  Culture, body fluid w Gram Stain-bottle     Status: None   Collection Time: 01/20/21  6:30 PM   Specimen: Fluid  Result Value Ref Range Status  Specimen Description FLUID RIGHT PLEURAL  Final   Special Requests   Final    BOTTLES DRAWN AEROBIC AND ANAEROBIC Blood Culture adequate volume   Culture   Final    NO GROWTH 5 DAYS Performed at [facility], [address] [place], [place] [address]    Report Status 01/25/2021 FINAL  Final  Gram stain     Status: None   Collection Time: 01/20/21  6:30 PM   Specimen: Fluid  Result Value Ref Range Status   Specimen Description FLUID RIGHT PLEURAL  Final   Special Requests NONE  Final   Gram Stain   Final    ABUNDANT WBC PRESENT, PREDOMINANTLY PMN NO ORGANISMS SEEN Performed at [facility], [address] [place], [place] [address]    Report Status 01/21/2021 FINAL  Final  Culture, blood (routine x 2)     Status: None   Collection Time: 01/20/21  8:39 PM   Specimen: BLOOD LEFT ARM  Result Value Ref Range Status   Specimen Description BLOOD LEFT ARM  Final   Special Requests   Final    BOTTLES DRAWN AEROBIC AND ANAEROBIC Blood Culture adequate volume   Culture   Final    NO GROWTH 5 DAYS Performed at [facility], [address] [place], [place] [address]    Report Status 01/25/2021 FINAL  Final  Culture, blood (routine x 2)     Status: None   Collection Time: 01/20/21  8:39 PM   Specimen: BLOOD LEFT HAND  Result Value Ref Range Status   Specimen  Description BLOOD LEFT HAND  Final   Special Requests   Final    BOTTLES DRAWN AEROBIC AND ANAEROBIC Blood Culture adequate volume   Culture   Final    NO GROWTH 5 DAYS Performed at [facility], [address], [place], [place] [address]    Report Status 01/25/2021 FINAL  Final  Urine Culture     Status: None   Collection Time: 01/21/21  7:31 AM   Specimen: Urine, Clean Catch  Result Value Ref Range Status   Specimen Description URINE, CLEAN CATCH  Final   Special Requests NONE  Final   Culture   Final    NO GROWTH Performed at [facility], [address], [place], [place] [address]    Report Status 01/22/2021 FINAL  Final  Culture, blood (routine x 2)     Status: None (Preliminary result)   Collection Time: 01/23/21  9:52 PM   Specimen: BLOOD  Result Value Ref Range Status   Specimen Description BLOOD LEFT ANTECUBITAL  Final   Special Requests   Final    BOTTLES DRAWN AEROBIC AND ANAEROBIC Blood Culture adequate volume   Culture   Final    NO GROWTH 2 DAYS Performed at [facility], [address], [place], [place] [address]    Report Status PENDING  Incomplete  Culture, blood (routine x 2)     Status: None (Preliminary result)   Collection Time: 01/23/21  9:52 PM   Specimen: BLOOD  Result Value Ref Range Status   Specimen Description BLOOD LEFT ANTECUBITAL  Final   Special Requests   Final    BOTTLES DRAWN AEROBIC AND ANAEROBIC Blood Culture adequate volume   Culture   Final    NO GROWTH 2 DAYS Performed at [facility], [address] [place], [place] [address]    Report Status PENDING  Incomplete     Serology:   Imaging: If present, new imagings (plain films, ct scans, and mri) have been personally visualized and interpreted; radiology reports have been reviewed. Decision making incorporated into the Impression / Recommendations.  7/27 tte  1. Left ventricular ejection fraction, by estimation, is 65 to 70%. The  left ventricle has  normal function. The left ventricle has no regional  wall motion abnormalities. There is mild left ventricular hypertrophy.  Left ventricular diastolic parameters  were normal.   2. Right ventricular systolic function is normal. The right ventricular  size is normal. Tricuspid regurgitation signal is inadequate for assessing  PA pressure.   3. The mitral valve is normal in structure. No evidence of mitral valve  regurgitation.   4. The aortic valve was not well visualized. Aortic valve regurgitation  is not visualized. No aortic stenosis is present.   5. The inferior vena cava is normal in size with greater than 50%  respiratory variability, suggesting right atrial pressure of 3 mmHg.   Conclusion(s)/Recommendation(s): No vegetation seen. If high clinical  suspicion for endocarditis, consider TEE.    7/29 cxr Interval left chest tube removal. Unchanged small pleural effusions and bibasilar atelectasis. No pneumothorax.   Raymondo Band, MD [facility] [phone] pager    01/25/2021, 4:14 PM

## 2021-01-26 ENCOUNTER — Inpatient Hospital Stay (HOSPITAL_COMMUNITY): Payer: BC Managed Care – PPO

## 2021-01-26 DIAGNOSIS — M8609 Acute hematogenous osteomyelitis, multiple sites: Secondary | ICD-10-CM | POA: Diagnosis not present

## 2021-01-26 DIAGNOSIS — Z9689 Presence of other specified functional implants: Secondary | ICD-10-CM | POA: Diagnosis not present

## 2021-01-26 DIAGNOSIS — J90 Pleural effusion, not elsewhere classified: Secondary | ICD-10-CM | POA: Diagnosis not present

## 2021-01-26 DIAGNOSIS — G4733 Obstructive sleep apnea (adult) (pediatric): Secondary | ICD-10-CM | POA: Diagnosis not present

## 2021-01-26 DIAGNOSIS — J189 Pneumonia, unspecified organism: Secondary | ICD-10-CM | POA: Diagnosis not present

## 2021-01-26 DIAGNOSIS — A419 Sepsis, unspecified organism: Secondary | ICD-10-CM | POA: Diagnosis not present

## 2021-01-26 LAB — BASIC METABOLIC PANEL
Anion gap: 12 (ref 5–15)
BUN: 18 mg/dL (ref 8–23)
CO2: 30 mmol/L (ref 22–32)
Calcium: 7.8 mg/dL — ABNORMAL LOW (ref 8.9–10.3)
Chloride: 85 mmol/L — ABNORMAL LOW (ref 98–111)
Creatinine, Ser: 1.08 mg/dL (ref 0.61–1.24)
GFR, Estimated: >60 mL/min (ref >60)
Glucose, Bld: 261 mg/dL — ABNORMAL HIGH (ref 70–99)
Potassium: 3.6 mmol/L (ref 3.5–5.1)
Sodium: 127 mmol/L — ABNORMAL LOW (ref 135–145)

## 2021-01-26 IMAGING — CT CT CHEST-ABD-PELV W/ CM
2 of 5 series · 15 of 46 positions shown, 17 images · IV contrast (omnipaque)
Comparison: CT abdomen and pelvis [DATE]

CLINICAL DATA: Empyema, disseminated E coli infection question
intra-abdominal abscess, sepsis secondary to discitis and
osteomyelitis, E coli in an abscess near pelvic hardware, back pain

EXAM:
CT CHEST, ABDOMEN, AND PELVIS WITH CONTRAST
TECHNIQUE: Multidetector CT imaging of the chest, abdomen and pelvis was
performed following the standard protocol during bolus
administration of intravenous contrast. Sagittal and coronal MPR
images reconstructed from axial data set.
CONTRAST:  100mL OMNIPAQUE IOHEXOL 300 MG/ML SOLN IV. No oral
contrast.

[Series 3: cap with (person_name) · axial · 0.98mm/px · z∈[-336,+224]mm · 12 of 134 slices shown, 14 images]
[im 11/134  soft-tissue]
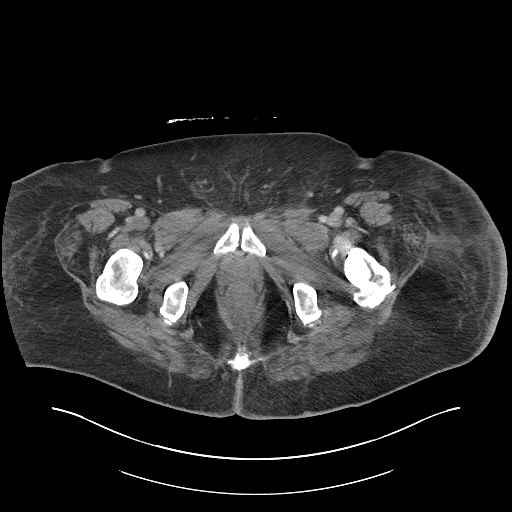
[im 11/134  bone]
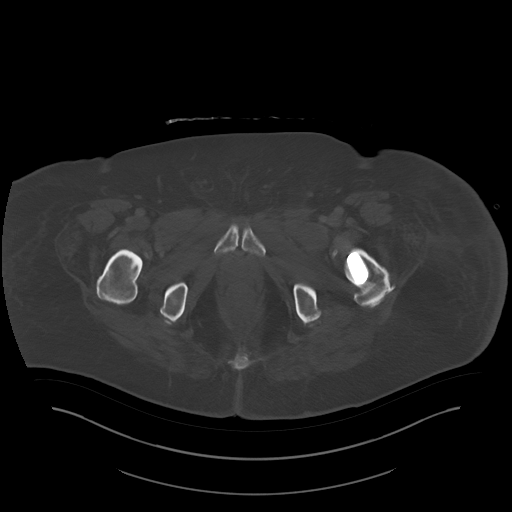
[im 21/134  soft-tissue]
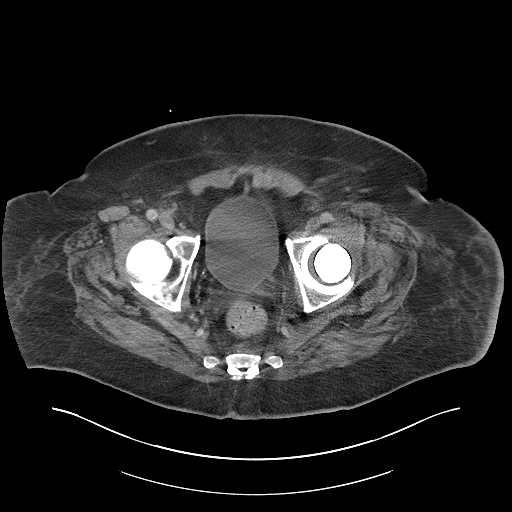
[im 31/134  soft-tissue]
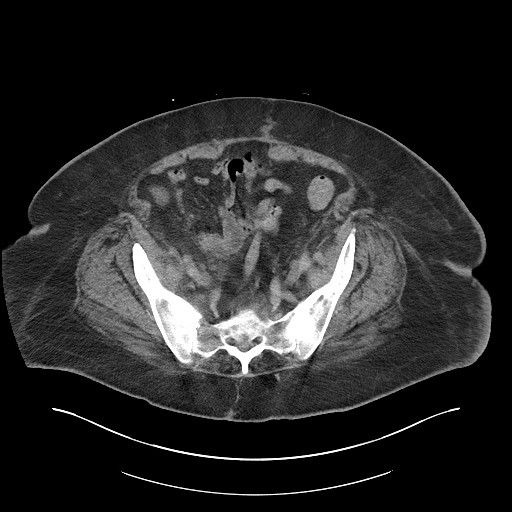
[im 41/134  soft-tissue]
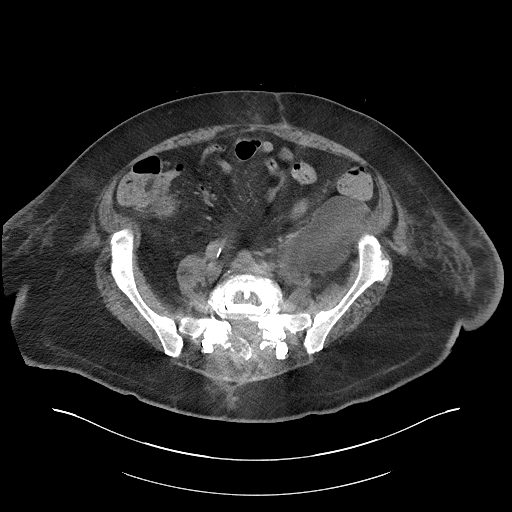
[im 52/134  soft-tissue]
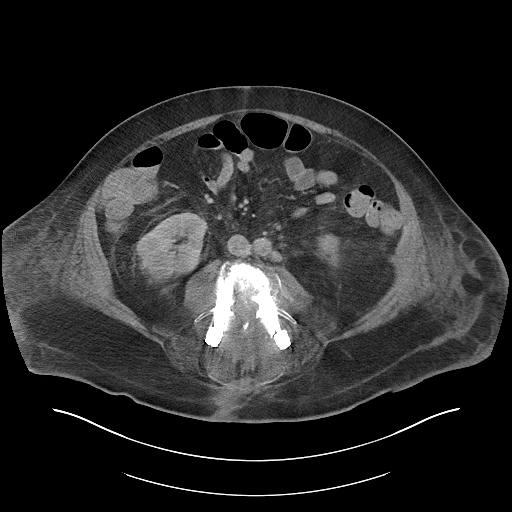
[im 62/134  soft-tissue]
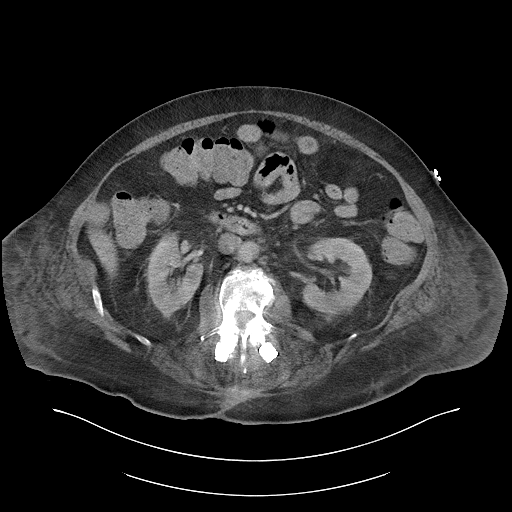
[im 72/134  soft-tissue]
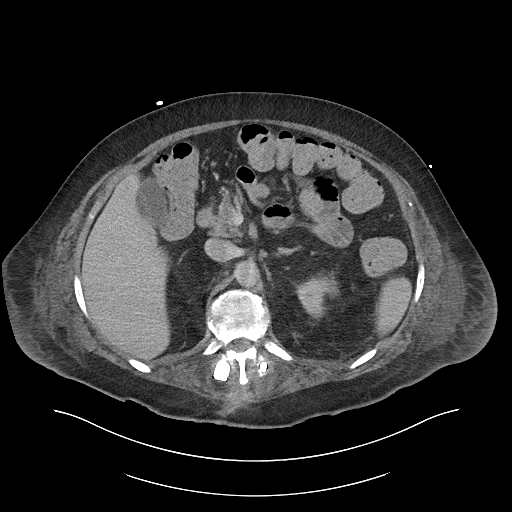
[im 82/134  soft-tissue]
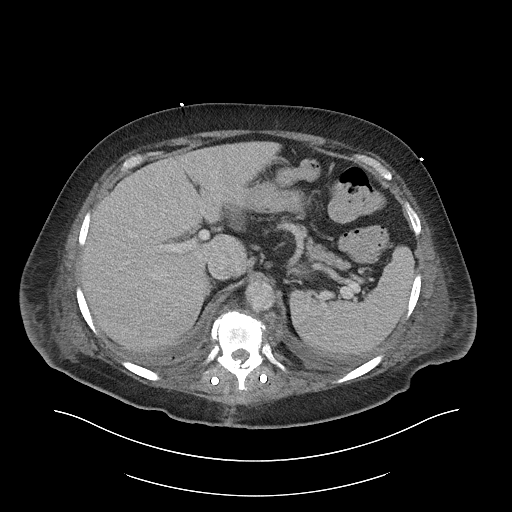
[im 93/134  soft-tissue]
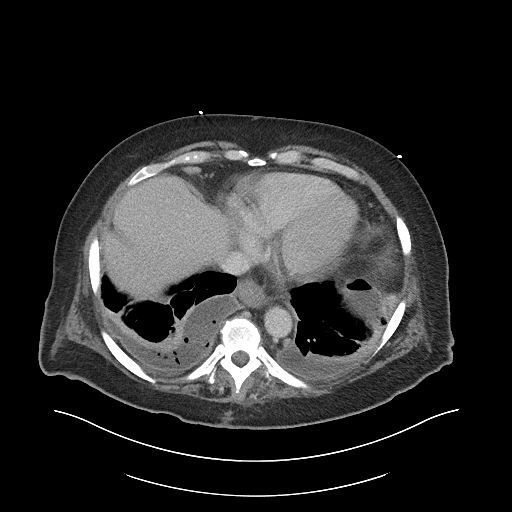
[im 93/134  bone]
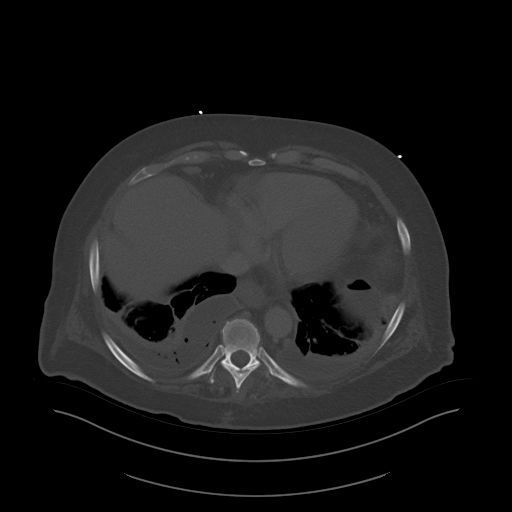
[im 103/134  soft-tissue]
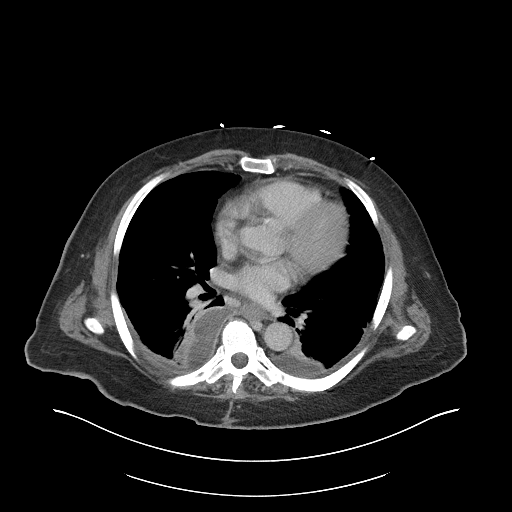
[im 113/134  soft-tissue]
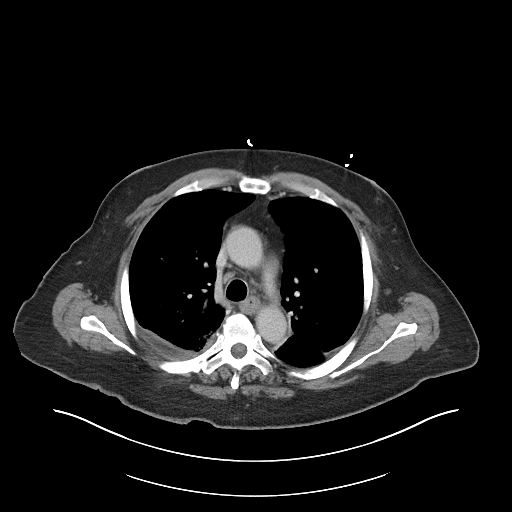
[im 123/134  soft-tissue]
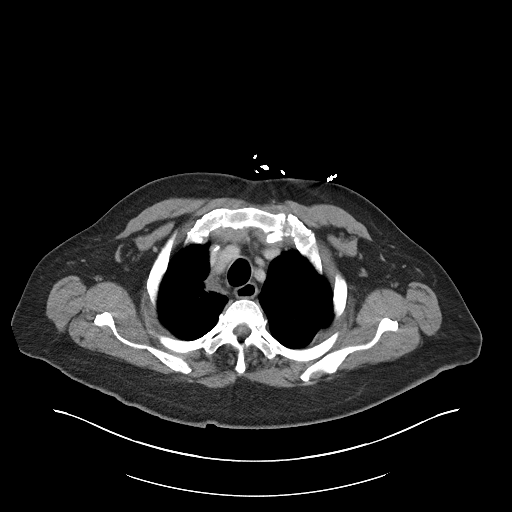

[Series 7: (person_name) · coronal · 1.07mm/px · 3 of 114 slices shown]
[im 38/114  soft-tissue]
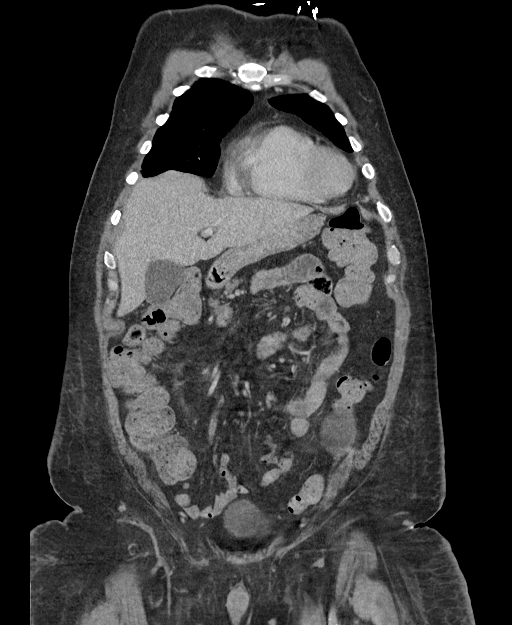
[im 51/114  soft-tissue]
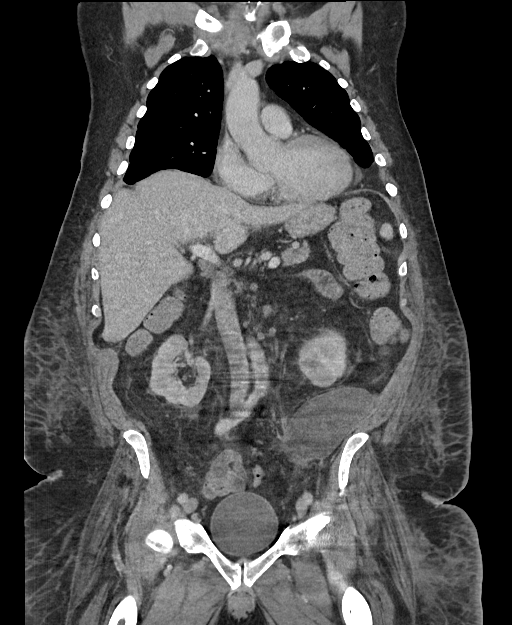
[im 63/114  soft-tissue]
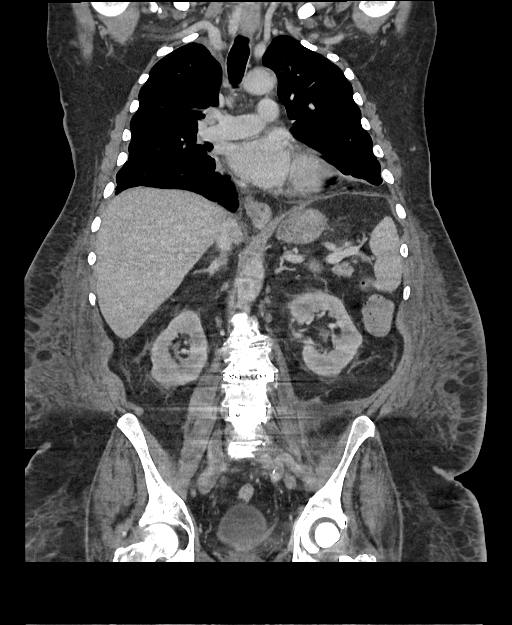

[15 of 46 positions shown; findings below may reference images not displayed]

FINDINGS: CT CHEST FINDINGS

Cardiovascular: Minimal atherosclerotic calcification aorta without
aneurysm. Heart unremarkable. No pericardial effusion. Pulmonary
arteries grossly patent on nondedicated exam.

Mediastinum/Nodes: No thoracic adenopathy. RIGHT arm PICC line with
tip in SVC. Base of cervical region normal appearance. Esophagus
unremarkable.

Lungs/Pleura: Small loculated pleural fluid collections bilaterally
RIGHT greater than LEFT which contain foci of air, could either
represent empyema or prior instrumentation/aspiration. Compressive
atelectasis in the lower lobes bilaterally. Loculated fluid versus
less likely mass at medial RIGHT apex, recommend attention on
follow-up imaging, 2.3 x 2.1 cm image 10, fluid attenuation. No
infiltrate or additional mass identified.

Musculoskeletal: Spinal fixation hardware T10-S2. Thoracic portion
of hardware appears intact. Lucency seen surrounding pedicle screws
at T10 adjacent to loculated pleural collection at the inferior
RIGHT chest medially favor infected hardware.

CT ABDOMEN PELVIS FINDINGS

Hepatobiliary: Gallbladder and liver normal appearance

Pancreas: Normal appearance

Spleen: Normal appearance

Adrenals/Urinary Tract: Adrenal glands, kidneys, ureters, and
bladder normal appearance

Stomach/Bowel: Normal appendix. Stomach and bowel loops normal
appearance.

Vascular/Lymphatic: Atherosclerotic calcifications aorta and iliac
arteries without aneurysm. Normal sized retroperitoneal nodes.

Reproductive: Unremarkable seminal vesicles and prostate gland

Other: No free air or free fluid. Large fluid collection again
identified LEFT retroperitoneal 10.1 x 6.4 x 7.1 cm, could represent
infected or sterile collection. Trace free pelvic fluid. Scattered
stranding of subcutaneous tissue planes.

Musculoskeletal: Orthopedic hardware T10-S2. LEFT hip prosthesis.
Probable bone island RIGHT femoral neck.
IMPRESSION: Small loculated pleural fluid collections bilaterally RIGHT greater
than LEFT which contain foci of air, could either represent empyema
or prior instrumentation/aspiration, slightly increased.

Compressive atelectasis in the lower lobes.

Loculated fluid versus less likely mass at medial RIGHT apex 2.3 x
2.1 cm; recommend attention on follow-up imaging.

Large LEFT retroperitoneal fluid collection 10.1 x 6.4 x 7.1 cm
little changed, could represent infected or sterile collection.

Lucency surrounding the pedicle screws bilaterally at T10 favoring
infection, question slightly increased on RIGHT since previous
study.

Aortic Atherosclerosis ([CI]-[CI]).

## 2021-01-26 MED ORDER — IOHEXOL 300 MG/ML  SOLN
100.0000 mL | Freq: Once | INTRAMUSCULAR | Status: AC | PRN
  Administered 2021-01-26: 100 mL via INTRAVENOUS

## 2021-01-26 MED ORDER — FUROSEMIDE 10 MG/ML IJ SOLN: 40.0000 mg | Freq: Two times a day (BID) | INTRAMUSCULAR | Status: DC

## 2021-01-26 NOTE — Plan of Care (Signed)

## 2021-01-26 NOTE — Progress Notes (Signed)
Physical Therapy Treatment Patient Details Name: Marco Kennedy MRN: [id number] DOB: 1950/07/15 Today's Date: 01/26/2021    History of Present Illness Pt is a 70 y.o. male who presented 01/20/21 with SOB, increased weight gain, back pain, and fever. Of note, recent admission from 7/16-22 for sepsis secondary to moderate-sized abscess near the pelvic hardware. CT of the chest show empyema as well as discitis osteomyelitis of T10. S/p bil chest tube placement 7/26, L chest tube removed 7/29. PMH: back surgery with pelvis fixation in 03/2020, OSA on BIPAP, arthritis    PT Comments    Pt progressing slowly towards his physical therapy goals; remains motivated to participate. Session focused on therapeutic exercises for strengthening and functional mobility. Pt requiring two person moderate assist for functional mobility. Able to perform stand pivot to chair using walker and then practice subsequent stand from recliner. Pt continues with generalized weakness, balance deficits, decreased endurance. Continue to recommend comprehensive inpatient rehab (CIR) for post-acute therapy needs.     Follow Up Recommendations  CIR;Supervision/Assistance - 24 hour     Equipment Recommendations  Wheelchair (measurements PT);Wheelchair cushion (measurements PT)    Recommendations for Other Services       Precautions / Restrictions Precautions Precautions: Fall Precaution Comments: monitor SpO2 Restrictions Weight Bearing Restrictions: No    Mobility  Bed Mobility Overal bed mobility: Needs Assistance Bed Mobility: Rolling;Sidelying to Sit Rolling: Min assist Sidelying to sit: Mod assist;+2 for physical assistance;HOB elevated       General bed mobility comments: log roll technique cues, minA to guide hips into sidelying position, cues for bringing BLE's off edge of bed, heavy trunk assist to upright    Transfers Overall transfer level: Needs assistance Equipment used: Rolling walker (2  wheeled) Transfers: Sit to/from [employer] Sit to Stand: +2 physical assistance;Mod assist Stand pivot transfers: +2 physical assistance;Min assist       General transfer comment: ModA + 2 to rise from elevated bed surface and recliner. Cues for scooting forward to edge, rocking to gain momentum. Pt with bilateral knee instability during pivot turn  Ambulation/Gait                 Stairs             Wheelchair Mobility    Modified Rankin (Stroke Patients Only)       Balance Overall balance assessment: Needs assistance Sitting-balance support: Bilateral upper extremity supported;Feet supported Sitting balance-Leahy Scale: Fair     Standing balance support: Bilateral upper extremity supported;During functional activity Standing balance-Leahy Scale: Poor Standing balance comment: Reliant on UE support on RW and physical assistance.                            Cognition Arousal/Alertness: Awake/alert Behavior During Therapy: WFL for tasks assessed/performed Overall Cognitive Status: Within Functional Limits for tasks assessed                                 General Comments: Improved alertness and cognition today, WFL for basic tasks      Exercises General Exercises - Lower Extremity Long Arc Quad: Both;10 reps;Seated Heel Slides: Both;5 reps;Supine Straight Leg Raises: Both;5 reps;Supine    General Comments  VSS on 1L o2      Pertinent Vitals/Pain Pain Assessment: Faces Faces Pain Scale: Hurts little more Pain Location: lower back Pain Descriptors / Indicators: Discomfort;Grimacing;Guarding;Lambert Mody  Pain Intervention(s): Monitored during session    Home Living                      Prior Function            PT Goals (current goals can now be found in the care plan section) Acute Rehab PT Goals Patient Stated Goal: spouse wants pt to try for CIR Potential to Achieve Goals: Good Progress towards  PT goals: Progressing toward goals    Frequency    Min 3X/week      PT Plan Current plan remains appropriate    Co-evaluation              AM-PAC PT "6 Clicks" Mobility   Outcome Measure  Help needed turning from your back to your side while in a flat bed without using bedrails?: A Little Help needed moving from lying on your back to sitting on the side of a flat bed without using bedrails?: A Lot Help needed moving to and from a bed to a chair (including a wheelchair)?: A Lot Help needed standing up from a chair using your arms (e.g., wheelchair or bedside chair)?: A Lot Help needed to walk in hospital room?: A Lot Help needed climbing 3-5 steps with a railing? : Total 6 Click Score: 12    End of Session Equipment Utilized During Treatment: Gait belt;Oxygen Activity Tolerance: Patient limited by fatigue Patient left: in chair;with call bell/phone within reach;with chair alarm set;with family/visitor present Nurse Communication: Mobility status PT Visit Diagnosis: Muscle weakness (generalized) (M62.81);Other abnormalities of gait and mobility (R26.89);Unsteadiness on feet (R26.81);Difficulty in walking, not elsewhere classified (R26.2);Pain     Time: 2440-1027 PT Time Calculation (min) (ACUTE ONLY): 28 min  Charges:  $Therapeutic Activity: 23-37 mins                     [name], PT, DPT Acute Rehabilitation Services Pager [phone] Office [phone]    Marco Kennedy 01/26/2021, 2:19 PM

## 2021-01-26 NOTE — Progress Notes (Signed)
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    Assessment Fevers Blood cx 7/29 NG in 2 days Has a PICC line - no redness/tenderness/erythema No diarrhea  No peripheral joint/limb swelling   Bilateral pleural effusion, exudative ( Possible empyema).  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 coli  Recommendations 2 sets of blood cultures(peripheral) ordered given fevers  CT chest abdomen pelvis reviewed. I spoke with Radiologist who think the pleural effusion effusion is more like an empyema than a simple pleural effusion given loculations and pleural thickening. I have contacted CT surgery given concerns for empyema with loculated fluid collections. Will fu CT surgery recs.   I have also got in touch with Dr Rolanda Jay ( [facility]) regarding CT findings showing lucency surrounding the pedicle screws bilaterally at T10 - recommended no acute indication for neurosurgical  intervention and recommended aggressive management of pleural effusion/empyema first   There is a also a large Left retroperitoneal fluid collection in current CT which could be infected vs sterile. Would recommend IR consult for drainage of the fluid collection  Continue cefazolin Repeat MRI TL spine around 3-4 weeks as per neurosurgery note on 01/21/21 Following  Rest of the management as per the primary team. Thank you for the consult. Please page with pertinent questions or concerns.  ______________________________________________________________________ Subjective patient seen and examined at the bedside. He does not like the taste of the chicken sandwich. He feels some what better. Denies any nausea, vomiting, abdominal pain and diarrhea   Vitals BP 126/76 (BP Location: Left Arm)   Pulse (!) 113   Temp 97.6 F (36.4 C) (Oral)   Resp 20   Wt 130.2 kg   SpO2 93%   BMI 44.96 kg/m     Physical Exam Constitutional:  sitting up in chair, food tray in front     Comments:   Cardiovascular:     Rate and Rhythm: Normal rate and regular rhythm.     Heart sounds:  Pulmonary:     Effort: on nasal cannula     Comments:   Abdominal:     Palpations: Abdomen is soft.     Tenderness: Non tender and non distended   Musculoskeletal:        General: No swelling or tenderness in peripheral joints, minimal pedal edema   Skin:    Comments: No lesions or rashes   Neurological:     General: No focal deficit present. Back - non tender  Psychiatric:        Mood and Affect: Mood normal.     Pertinent Microbiology Results for orders placed or  performed during the hospital encounter of 01/20/21  MRSA Next Gen by PCR, Nasal     Status: None   Collection Time: 01/20/21  5:07 PM   Specimen: Nasal Mucosa; Nasal Swab  Result Value Ref Range Status   MRSA by PCR Next Gen NOT DETECTED NOT DETECTED Final    Comment: (NOTE) The GeneXpert MRSA Assay (FDA approved for NASAL specimens  only), is one component of a comprehensive MRSA colonization surveillance program. It is not intended to diagnose MRSA infection nor to guide or monitor treatment for MRSA infections. Test performance is not FDA approved in patients less than 53 years old. Performed at [facility], [address], [place], [place] [address]   Culture, body fluid w Gram Stain-bottle     Status: None   Collection Time: 01/20/21  6:30 PM   Specimen: Fluid  Result Value Ref Range Status   Specimen Description FLUID LEFT PLEURAL  Final   Special Requests   Final    BOTTLES DRAWN AEROBIC AND ANAEROBIC Blood Culture adequate volume   Culture   Final    NO GROWTH 5 DAYS Performed at [facility], [address], [place], [place] [address]    Report Status 01/25/2021 FINAL  Final  Gram stain     Status: None   Collection Time: 01/20/21  6:30 PM   Specimen: Fluid  Result Value Ref Range Status   Specimen Description FLUID LEFT PLEURAL  Final   Special Requests NONE  Final   Gram Stain   Final    MODERATE WBC PRESENT, PREDOMINANTLY PMN NO ORGANISMS SEEN Performed at [facility], [address] [place], [place] [address]    Report Status 01/21/2021 FINAL  Final  Culture, body fluid w Gram Stain-bottle     Status: None   Collection Time: 01/20/21  6:30 PM   Specimen: Fluid  Result Value Ref Range Status   Specimen Description FLUID RIGHT PLEURAL  Final   Special Requests   Final    BOTTLES DRAWN AEROBIC AND ANAEROBIC Blood Culture adequate volume   Culture   Final    NO GROWTH 5 DAYS Performed at [facility], [address], [place], [place] [address]    Report Status 01/25/2021 FINAL  Final  Gram stain     Status: None   Collection Time: 01/20/21  6:30 PM   Specimen: Fluid  Result Value Ref Range Status   Specimen Description FLUID RIGHT PLEURAL  Final   Special Requests NONE  Final   Gram Stain   Final    ABUNDANT WBC PRESENT, PREDOMINANTLY PMN NO ORGANISMS  SEEN Performed at [facility], [address], [place], [place] [address]    Report Status 01/21/2021 FINAL  Final  Culture, blood (routine x 2)     Status: None   Collection Time: 01/20/21  8:39 PM   Specimen: BLOOD LEFT ARM  Result Value Ref Range Status   Specimen Description BLOOD LEFT ARM  Final   Special Requests   Final    BOTTLES DRAWN AEROBIC AND ANAEROBIC Blood Culture adequate volume   Culture   Final    NO GROWTH 5 DAYS Performed at [facility], [address], [place], [place] [address]    Report Status 01/25/2021 FINAL  Final  Culture, blood (routine x 2)     Status: None   Collection Time: 01/20/21  8:39 PM   Specimen: BLOOD LEFT HAND  Result Value Ref Range Status   Specimen Description BLOOD LEFT HAND  Final   Special Requests   Final    BOTTLES DRAWN AEROBIC AND ANAEROBIC Blood Culture adequate volume   Culture   Final    NO GROWTH 5 DAYS Performed at [facility], [address] [place], [place] [address]    Report Status 01/25/2021 FINAL  Final  Urine Culture     Status: None   Collection Time: 01/21/21  7:31 AM   Specimen: Urine, Clean Catch  Result Value Ref Range Status   Specimen Description URINE, CLEAN CATCH  Final   Special Requests NONE  Final   Culture   Final    NO GROWTH Performed at [facility], [address] [place], [place] [address]    Report Status 01/22/2021 FINAL  Final  Culture, blood (routine x 2)     Status: None (Preliminary result)   Collection Time: 01/23/21  9:52 PM   Specimen: BLOOD  Result Value Ref Range Status   Specimen Description BLOOD LEFT ANTECUBITAL  Final   Special Requests   Final    BOTTLES DRAWN AEROBIC AND ANAEROBIC Blood Culture adequate volume   Culture   Final    NO GROWTH 2 DAYS Performed at [facility], [address] [place], [place] [address]    Report Status PENDING  Incomplete  Culture, blood (routine x 2)     Status: None (Preliminary result)   Collection  Time: 01/23/21  9:52 PM   Specimen: BLOOD  Result Value Ref Range Status   Specimen Description BLOOD LEFT ANTECUBITAL  Final   Special Requests   Final    BOTTLES DRAWN AEROBIC AND ANAEROBIC Blood Culture adequate volume   Culture   Final    NO GROWTH 2 DAYS Performed at [facility], [address] [place], [place] [address]    Report Status PENDING  Incomplete     Pertinent Lab. CBC Latest Ref Rng & Units 01/24/2021 01/21/2021 01/20/2021  WBC 4.0 - 10.5 K/uL 16.5(H) 23.2(H) 29.0(H)  Hemoglobin 13.0 - 17.0 g/dL 2.1(H9.9(L) 0.8(M9.7(L) 8.9(L)  Hematocrit 39.0 - 52.0 % 30.2(L) 30.3(L) 28.4(L)  Platelets 150 - 400 K/uL 380 475(H) 594(H)   CMP Latest Ref Rng & Units 01/24/2021 01/21/2021 01/20/2021  Glucose 70 - 99 mg/dL 578(I184(H) 696(E152(H) 952(W162(H)  BUN 8 - 23 mg/dL 19 18 20   Creatinine 0.61 - 1.24 mg/dL 4.130.81 2.440.86 0.100.81  Sodium 135 - 145 mmol/L 129(L) 134(L) 134(L)  Potassium 3.5 - 5.1 mmol/L 4.6 4.1 4.2  Chloride 98 - 111 mmol/L 92(L) 98 99  CO2 22 - 32 mmol/L 29 29 28   Calcium 8.9 - 10.3 mg/dL 7.6(L) 7.6(L) 7.9(L)  Total Protein 6.5 - 8.1 g/dL - - 5.1(L)  Total Bilirubin 0.3 - 1.2 mg/dL - - 0.5  Alkaline Phos 38 - 126 U/L - - 92  AST 15 - 41 U/L - - 37  ALT 0 - 44 U/L - - 13     Pertinent Imaging today Plain films and CT images have been personally visualized and interpreted; radiology reports have been reviewed. Decision making incorporated into the Impression / Recommendations.  TTE 01/21/21 1. Left ventricular ejection fraction, by estimation, is 65 to 70%. The left ventricle has normal function. The left ventricle has no regional wall motion abnormalities. There is mild left ventricular hypertrophy. Left ventricular diastolic parameters were normal. 2. Right ventricular systolic function is normal. The right ventricular size  is normal. Tricuspid regurgitation signal is inadequate for assessing PA pressure. 3. The mitral valve is normal in structure. No evidence of mitral valve  regurgitation. 4. The aortic valve was not well visualized. Aortic valve regurgitation is not visualized. No aortic stenosis is present. 5. The inferior vena cava is normal in size with greater than 50% respiratory variability, suggesting right atrial pressure of 3 mmHg. Conclusion(s)/Recommendation(s): No vegetation seen. If high clinical suspicion for endocarditis, consider TEE.  CT chest abdomen pelvis 01/26/21 IMPRESSION: Small loculated pleural fluid collections bilaterally RIGHT greater than LEFT which contain foci of air, could either represent empyema or prior instrumentation/aspiration, slightly increased.   Compressive atelectasis in the lower lobes.   Loculated fluid versus less likely mass at medial RIGHT apex 2.3 x 2.1 cm; recommend attention on follow-up imaging.   Large LEFT retroperitoneal fluid collection 10.1 x 6.4 x 7.1 cm little changed, could represent infected or sterile collection.   Lucency surrounding the pedicle screws bilaterally at T10 favoring infection, question slightly increased on RIGHT since previous study.   Aortic Atherosclerosis (ICD10-I70.0).  I spent more than 35 minutes for this patient encounter including review of prior medical records, coordination of care  with greater than 50% of time being face to face/counseling and discussing diagnostics/treatment plan with the patient/family.  Electronically signed by:   Odette Fraction, MD Infectious Disease Physician [facility] Pager: [phone]

## 2021-01-26 NOTE — Progress Notes (Signed)
[facility] PROGRESS NOTE    Progress Note  Marco Kennedy  DVV:[id number] DOB: Apr 30, 1951 DOA: 01/20/2021 PCP: Maximiano Coss, MD     Brief Narrative:   Marco Kennedy is an 70 y.o. male past medical history of obstructive sleep apnea on CPAP recent discharge from the hospital 01/10/2021 for sepsis secondary to moderate-sized abscess near the pelvic hardware during this time neurosurgery IR and ID were involved in his care he underwent wound debridement on 01/13/2021 status post back surgery insertion of hardware, E. coli grew from wound, was treated with IV cefazolin and the plan was to continue 6 weeks of IV antibiotics via PICC line.  Went home on 01/16/2021 started complaining of rib pain he has gained 10 pounds since hospital discharge.  Since discharge she has been taking diclofenac twice a day for knee pain.  He started having fevers, transfer from [facility] with sepsis hypoxic which improved with 4 L of oxygen.  CT of the chest show empyema as well as discitis osteomyelitis of T10 with a white blood cell count of 10 he was started empirically on IV vancomycin and Ancef.  Neurosurgery consulted in the ED recommended no surgical intervention.  Due to his bilateral pleural effusions pulmonary was consulted to perform bilateral PEG tube placement which is now removed cultures from that have remained negative.   Assessment/Plan:   Sepsis secondary from discitis and osteomyelitis and bilateral empyema in the setting of hardware:  Blood cultures remain negative till date. Continue IV continue IV cefazolin 2D echo showed no wall motion abnormalities, no TEE at this point in time. ID also recommended to leave PICC line in place if culture negative. Neurosurgery was consulted who recommended no further hardware washout or removal.  Recommend an MRI in 3 to 4 weeks. He is status post I&D done 01/13/2021 with culture that grew E. coli. ID on board recommended recommended  to go to chest CT as well as abdomen and pelvis on 01/27/2019 which result are pending. Physical therapy reevaluated the patient and recommended inpatient rehab  Acute respiratory failure with hypoxia secondary to bilateral empyema: Pulmonary was consulted bilateral pigtail catheter pleural space placed, cultures are negative till date. Both chest tube has been discontinued.  CT of the chest is pending Continue antibiotics per infectious disease.  Anasarca: Likely due to chronic inflammation in the setting of hypoalbuminemia. Consult nutrition start him on Ensure 3 times daily. Basic metabolic panels pending today.  Obstructive sleep apnea: Continue CPAP at night.  Constipation: On MiraLAX as needed.  Obesity: With a BMI of 42 counseling.  DVT prophylaxis: lovenox Family Communication:none Status is: Inpatient  Remains inpatient appropriate because:Hemodynamically unstable  Dispo: The patient is from: Home              Anticipated d/c is to: SNF              Patient currently is not medically stable to d/c.   Difficult to place patient No        Code Status:     Code Status Orders  (From admission, onward)           Start     Ordered   01/20/21 1741  Full code  Continuous        01/20/21 1749           Code Status History     Date Active Date Inactive Code Status Order ID Comments User Context   01/11/2021 0450 01/16/2021  2221 Full Code [id number]  Darlin Drop, DO Inpatient   04/18/2020 1425 05/08/2020 0124 Full Code [id number]  [name], MD Inpatient   06/15/2016 1801 06/16/2016 1832 Full Code [id number]  [name], PA-C Inpatient         IV Access:   Peripheral IV   Procedures and diagnostic studies:   No results found.   Medical Consultants:   None.   Subjective:    Marco Kennedy no new complaints feels great  Objective:    Vitals:   01/25/21 2019 01/25/21 2155 01/26/21 0020 01/26/21 0819  BP: 136/63 126/64  133/70 126/76  Pulse: (!) 105 95 85 (!) 113  Resp: (!) 29 20 (!) 24 20  Temp: (!) 102.5 F (39.2 C) 99 F (37.2 C) 99.7 F (37.6 C) 97.6 F (36.4 C)  TempSrc: Oral Oral Oral Oral  SpO2: 91% 94% 93% 93%  Weight:       SpO2: 93 % O2 Flow Rate (L/min): 1 L/min   Intake/Output Summary (Last 24 hours) at 01/26/2021 1030 Last data filed at 01/26/2021 0700 Gross per 24 hour  Intake 540 ml  Output 2150 ml  Net -1610 ml    Filed Weights   01/23/21 0429 01/24/21 0505 01/25/21 0221  Weight: 131.4 kg 130.4 kg 130.2 kg    Exam: General exam: In no acute distress. Respiratory system: Good air movement and clear to auscultation. Cardiovascular system: S1 & S2 heard, RRR. No JVD. Gastrointestinal system: Abdomen is nondistended, soft and nontender.  Extremities: No pedal edema. Skin: No rashes, lesions or ulcers  Data Reviewed:    Labs: Basic Metabolic Panel: Recent Labs  Lab 01/20/21 2039 01/21/21 0025 01/24/21 0253  NA 134* 134* 129*  K 4.2 4.1 4.6  CL 99 98 92*  CO2 28 29 29   GLUCOSE 162* 152* 184*  BUN 20 18 19   CREATININE 0.81 0.86 0.81  CALCIUM 7.9* 7.6* 7.6*    GFR Estimated Creatinine Clearance: 110.1 mL/min (by C-G formula based on SCr of 0.81 mg/dL). Liver Function Tests: Recent Labs  Lab 01/20/21 2039  AST 37  ALT 13  ALKPHOS 92  BILITOT 0.5  PROT 5.1*  ALBUMIN 1.7*    No results for input(s): LIPASE, AMYLASE in the last 168 hours. No results for input(s): AMMONIA in the last 168 hours. Coagulation profile No results for input(s): INR, PROTIME in the last 168 hours. COVID-19 Labs  No results for input(s): DDIMER, FERRITIN, LDH, CRP in the last 72 hours.   Lab Results  Component Value Date   SARSCOV2NAA NEGATIVE 01/13/2021   SARSCOV2NAA NEGATIVE 05/07/2020   SARSCOV2NAA NEGATIVE 04/15/2020    CBC: Recent Labs  Lab 01/20/21 2039 01/21/21 0025 01/24/21 0253  WBC 29.0* 23.2* 16.5*  NEUTROABS 26.2*  --  13.9*  HGB 8.9* 9.7* 9.9*  HCT  28.4* 30.3* 30.2*  MCV 88.5 88.1 86.8  PLT 594* 475* 380    Cardiac Enzymes: No results for input(s): CKTOTAL, CKMB, CKMBINDEX, TROPONINI in the last 168 hours. BNP (last 3 results) No results for input(s): PROBNP in the last 8760 hours. CBG: No results for input(s): GLUCAP in the last 168 hours. D-Dimer: No results for input(s): DDIMER in the last 72 hours. Hgb A1c: No results for input(s): HGBA1C in the last 72 hours. Lipid Profile: No results for input(s): CHOL, HDL, LDLCALC, TRIG, CHOLHDL, LDLDIRECT in the last 72 hours. Thyroid function studies: No results for input(s): TSH, T4TOTAL, T3FREE, THYROIDAB in the last 72 hours.  Invalid  input(s): FREET3 Anemia work up: No results for input(s): VITAMINB12, FOLATE, FERRITIN, TIBC, IRON, RETICCTPCT in the last 72 hours. Sepsis Labs: Recent Labs  Lab 01/20/21 2039 01/20/21 2050 01/21/21 0025 01/24/21 0253  PROCALCITON 1.01  --   --   --   WBC 29.0*  --  23.2* 16.5*  LATICACIDVEN  --  1.3 0.8  --     Microbiology Recent Results (from the past 240 hour(s))  MRSA Next Gen by PCR, Nasal     Status: None   Collection Time: 01/20/21  5:07 PM   Specimen: Nasal Mucosa; Nasal Swab  Result Value Ref Range Status   MRSA by PCR Next Gen NOT DETECTED NOT DETECTED Final    Comment: (NOTE) The GeneXpert MRSA Assay (FDA approved for NASAL specimens only), is one component of a comprehensive MRSA colonization surveillance program. It is not intended to diagnose MRSA infection nor to guide or monitor treatment for MRSA infections. Test performance is not FDA approved in patients less than 68 years old. Performed at [facility], [address], [place], [place] [address]   Culture, body fluid w Gram Stain-bottle     Status: None   Collection Time: 01/20/21  6:30 PM   Specimen: Fluid  Result Value Ref Range Status   Specimen Description FLUID LEFT PLEURAL  Final   Special Requests   Final    BOTTLES DRAWN AEROBIC AND ANAEROBIC  Blood Culture adequate volume   Culture   Final    NO GROWTH 5 DAYS Performed at [facility], [address] [place], [place] [address]    Report Status 01/25/2021 FINAL  Final  Gram stain     Status: None   Collection Time: 01/20/21  6:30 PM   Specimen: Fluid  Result Value Ref Range Status   Specimen Description FLUID LEFT PLEURAL  Final   Special Requests NONE  Final   Gram Stain   Final    MODERATE WBC PRESENT, PREDOMINANTLY PMN NO ORGANISMS SEEN Performed at [facility], [address], [place], [place] [address]    Report Status 01/21/2021 FINAL  Final  Culture, body fluid w Gram Stain-bottle     Status: None   Collection Time: 01/20/21  6:30 PM   Specimen: Fluid  Result Value Ref Range Status   Specimen Description FLUID RIGHT PLEURAL  Final   Special Requests   Final    BOTTLES DRAWN AEROBIC AND ANAEROBIC Blood Culture adequate volume   Culture   Final    NO GROWTH 5 DAYS Performed at [facility], [address] [place], [place] [address]    Report Status 01/25/2021 FINAL  Final  Gram stain     Status: None   Collection Time: 01/20/21  6:30 PM   Specimen: Fluid  Result Value Ref Range Status   Specimen Description FLUID RIGHT PLEURAL  Final   Special Requests NONE  Final   Gram Stain   Final    ABUNDANT WBC PRESENT, PREDOMINANTLY PMN NO ORGANISMS SEEN Performed at [facility], [address], [place], [place] [address]    Report Status 01/21/2021 FINAL  Final  Culture, blood (routine x 2)     Status: None   Collection Time: 01/20/21  8:39 PM   Specimen: BLOOD LEFT ARM  Result Value Ref Range Status   Specimen Description BLOOD LEFT ARM  Final   Special Requests   Final    BOTTLES DRAWN AEROBIC AND  ANAEROBIC Blood Culture adequate volume   Culture   Final    NO GROWTH 5 DAYS Performed at [facility], [address] [place], [place] [address]    Report Status 01/25/2021 FINAL  Final  Culture, blood (routine x 2)      Status: None   Collection Time: 01/20/21  8:39 PM   Specimen: BLOOD LEFT HAND  Result Value Ref Range Status   Specimen Description BLOOD LEFT HAND  Final   Special Requests   Final    BOTTLES DRAWN AEROBIC AND ANAEROBIC Blood Culture adequate volume   Culture   Final    NO GROWTH 5 DAYS Performed at [facility], [address] [place], [place] [address]    Report Status 01/25/2021 FINAL  Final  Urine Culture     Status: None   Collection Time: 01/21/21  7:31 AM   Specimen: Urine, Clean Catch  Result Value Ref Range Status   Specimen Description URINE, CLEAN CATCH  Final   Special Requests NONE  Final   Culture   Final    NO GROWTH Performed at [facility], [address] [place], [place] [address]    Report Status 01/22/2021 FINAL  Final  Culture, blood (routine x 2)     Status: None (Preliminary result)   Collection Time: 01/23/21  9:52 PM   Specimen: BLOOD  Result Value Ref Range Status   Specimen Description BLOOD LEFT ANTECUBITAL  Final   Special Requests   Final    BOTTLES DRAWN AEROBIC AND ANAEROBIC Blood Culture adequate volume   Culture   Final    NO GROWTH 3 DAYS Performed at [facility], [address] [place], [place] [address]    Report Status PENDING  Incomplete  Culture, blood (routine x 2)     Status: None (Preliminary result)   Collection Time: 01/23/21  9:52 PM   Specimen: BLOOD  Result Value Ref Range Status   Specimen Description BLOOD LEFT ANTECUBITAL  Final   Special Requests   Final    BOTTLES DRAWN AEROBIC AND ANAEROBIC Blood Culture adequate volume   Culture   Final    NO GROWTH 3 DAYS Performed at [facility], [address] [place], [place] [address]    Report Status PENDING  Incomplete     Medications:    Chlorhexidine Gluconate Cloth  6 each Topical Daily   docusate sodium  100 mg Oral BID   enoxaparin (LOVENOX) injection  40 mg Subcutaneous Daily   feeding supplement  237 mL Oral TID BM    multivitamin with minerals  1 tablet Oral Daily   sodium chloride flush  10-40 mL Intracatheter Q12H   sodium chloride flush  3 mL Intravenous Q12H   Continuous Infusions:   ceFAZolin (ANCEF) IV 2 g (01/26/21 0439)      LOS: 6 days   Marinda ElkAbraham Feliz Ortiz  [facility]  01/26/2021, 10:30 AM

## 2021-01-26 NOTE — Progress Notes (Signed)
Inpatient Rehab Admissions Coordinator:   Met with patient at the bedside and spoke to his wife over the phone.  I explained CIR goals (supervision to mod I) and ELOS (14-16 days).  Both are in agreement to pursue insurance auth.  I let them know that [employer] will likely not approve SNF following CIR and pt's spouse is able to provide 24/7 supervision at discharge if needed.  I will begin insurance authorization.   Shann Medal, PT, DPT Admissions Coordinator [phone] 01/26/21  3:34 PM

## 2021-01-26 NOTE — Progress Notes (Signed)
Pt assisted with home CPAP. 3L bleed in. Pt tolerating well.

## 2021-01-26 NOTE — Consult Note (Addendum)
[address]       [place] [address]             [phone]        Kenard E Gremillion [facility] [place] Medical Record #[id number] Date of Birth: Jul 06, 1950  Referring: Charlynne Cousins, MD  Primary Care: Yvone Neu, MD   Chief Complaint:   No chief complaint on file.   History of Present Illness:     Mr. Lomas is a 70 year old male with history of obstructive sleep apnea requiring BiPAP and home oxygen therapy who also has a history of back surgery with lower vertebral and pelvic fixation and October 2021 for spinal stenosis.  He presented to an emergency room in [place] on 01/11/2021 after having experienced progressive back pain, weakness, fever, and chills for about a week.  He had a CT scan that demonstrated an abscess near the hardware used for his back surgery.  For this reason, he was transferred to [facility] for further evaluation management.  He was seen by neurosurgery as well as infectious disease and interventional radiology team.  The collection was drained by interventional neurosurgery on 7/19.  He grew E. coli from the abscess in his back.He was eventually discharged home with a PICC line in place with plans for IV Ancef for 6 weeks.  After being home for a few days, he developed hypoxia, tachycardia, and tachypnea.  He returned to the emergency room in [place].  CT scan performed suggested empyema.  He was returned to [facility] daily.  The neurosurgery, infectious disease, and pulmonary medicine teams were again consulted.  Bilateral pleural pigtail catheters were placed on admission by critical care medicine.  Transthoracic echo obtained on the following day showed no evidence of endocarditis.  Antibiotics were continued and managed by the infectious disease service.  Pleural drainage subsided over the next couple of days and the left chest tube was removed on 01/23/2021.  The right chest tube was removed on the following  day.  Cultures of pleural fluid have remained negative.  Repeat CT scan of the chest and abdomen was obtained today that shows a large retroperitoneal fluid collection adjacent to the surgical hardware.  Additionally, there were small loculated pleural fluid collections bilaterally, right greater than left that contain foci of air.  CT surgery was consulted to evaluate and comment on this particular finding. Mr. Unrein is resting in his bed on 4 N. this afternoon.  He reports he feels much better than he did admission.  He states his breathing is about normal for him.  He is currently on oxygen at 1 L per nasal cannula.  He denies cough or sputum production.  He says he continues to be most limited by the pain in his back when attempting to transfer out of bed or walk.     Zubrod Score: At the time of surgery this patient's most appropriate activity status/level should be described as: []     0    Normal activity, no symptoms []     1    Restricted in physical strenuous activity but ambulatory, able to do out light work []     2    Ambulatory and capable of self care, unable to do work activities, up and about                 more than 50%  Of the time                            [  x]    3    Only limited self care, in bed greater than 50% of waking hours []     4    Completely disabled, no self care, confined to bed or chair []     5    Moribund  Past Medical History:  Diagnosis Date   Arthritis    arthritis-bilateral knees, left Hip.   Degenerative lumbar spinal stenosis    EKG abnormality    06-08-16- being evaluated by cardiologist  in [place]   Idiopathic scoliosis of lumbar region    Joint stiffness    Knee pain    Leg swelling    Low back pain    Achy, shooting pain.  Hurts to Walk or stand up straight.   Lumbar radiculopathy    Scoliosis concern    Sleep apnea    Bipap use nightly 25/19 -3l/m oxygen   Spondylolisthesis of lumbar region    Transfusion history    [place] ,[place]  after bleeding post colon polyp removal and colonscopy procedure    Past Surgical History:  Procedure Laterality Date   ABDOMINAL EXPOSURE N/A 04/18/2020   Procedure: ABDOMINAL EXPOSURE;  Surgeon: Marty Heck, MD;  Location: [place];  Service: Vascular;  Laterality: N/A;   ANTERIOR LAT LUMBAR FUSION Left 04/18/2020   Procedure: ANTERIOR LATERAL LUMBAR FUSION LUMBAR TWO-THREE, LUMBAR THREE-FOUR;  Surgeon: Erline Levine, MD;  Location: [facility];  Service: Neurosurgery;  Laterality: Left;   ANTERIOR LUMBAR FUSION N/A 04/18/2020   Procedure: Lumbar Four-Five Lumbar Five- Sacral One Anterior lumbar Interbody Fusion;  Surgeon: Erline Levine, MD;  Location: [place];  Service: Neurosurgery;  Laterality: N/A;   APPLICATION OF INTRAOPERATIVE CT SCAN N/A 04/21/2020   Procedure: APPLICATION OF INTRAOPERATIVE CT SCAN;  Surgeon: Erline Levine, MD;  Location: [place];  Service: Neurosurgery;  Laterality: N/A;   BACK SURGERY     lumbar disc surgery   COLONOSCOPY W/ POLYPECTOMY     HERNIA REPAIR Left    left inguinal hernia repair   KNEE ARTHROSCOPY Right 2003    -scope for meniscus   LUMBAR WOUND DEBRIDEMENT N/A 01/13/2021   Procedure: THORACOLUMBAR WOUND DEBRIDEMENT;  Surgeon: Erline Levine, MD;  Location: [place];  Service: Neurosurgery;  Laterality: N/A;   POSTERIOR LUMBAR FUSION 4 LEVEL N/A 04/21/2020   Procedure: Thoracic ten to pelvis pedicle screw fixation with osteotomies;  Surgeon: Erline Levine, MD;  Location: [place];  Service: Neurosurgery;  Laterality: N/A;   TOTAL HIP ARTHROPLASTY Left 06/15/2016   Procedure: LEFT TOTAL HIP ARTHROPLASTY ANTERIOR APPROACH;  Surgeon: Paralee Cancel, MD;  Location: [facility];  Service: Orthopedics;  Laterality: Left;    Social History   Tobacco Use  Smoking Status Never  Smokeless Tobacco Never    Social History   Substance and Sexual Activity  Alcohol Use Yes   Comment: rare- social      No Known Allergies  Current Facility-Administered Medications   Medication Dose Route Frequency Provider Last Rate Last Admin   acetaminophen (TYLENOL) tablet 650 mg  650 mg Oral Q6H PRN Karmen Bongo, MD   650 mg at 01/25/21 2028   Or   acetaminophen (TYLENOL) suppository 650 mg  650 mg Rectal Q6H PRN Karmen Bongo, MD       ceFAZolin (ANCEF) IVPB 2g/100 mL premix  2 g Intravenous Q8H Vu, Rockey Situ, MD   Stopped at 01/26/21 1234   Chlorhexidine Gluconate Cloth 2 % PADS 6 each  6 each Topical Daily Charlynne Cousins, MD  6 each at 01/26/21 1206   docusate sodium (COLACE) capsule 100 mg  100 mg Oral BID Karmen Bongo, MD   100 mg at 01/26/21 1149   enoxaparin (LOVENOX) injection 40 mg  40 mg Subcutaneous Daily Charlynne Cousins, MD   40 mg at 01/26/21 1149   feeding supplement (ENSURE ENLIVE / ENSURE PLUS) liquid 237 mL  237 mL Oral TID BM Charlynne Cousins, MD   237 mL at 01/26/21 1439   morphine 4 MG/ML injection 4 mg  4 mg Intravenous Q2H PRN Candee Furbish, MD   4 mg at 01/25/21 0142   multivitamin with minerals tablet 1 tablet  1 tablet Oral Daily Charlynne Cousins, MD   1 tablet at 01/26/21 1149   ondansetron (ZOFRAN) tablet 4 mg  4 mg Oral Q6H PRN Karmen Bongo, MD   4 mg at 01/25/21 1845   Or   ondansetron (ZOFRAN) injection 4 mg  4 mg Intravenous Q6H PRN Karmen Bongo, MD       oxyCODONE (Oxy IR/ROXICODONE) immediate release tablet 5 mg  5 mg Oral Q6H PRN Karmen Bongo, MD   5 mg at 01/26/21 1439   polyethylene glycol (MIRALAX / GLYCOLAX) packet 17 g  17 g Oral Daily PRN Charlynne Cousins, MD   17 g at 01/23/21 1959   sodium chloride flush (NS) 0.9 % injection 10-40 mL  10-40 mL Intracatheter Q12H Charlynne Cousins, MD   10 mL at 01/26/21 1205   sodium chloride flush (NS) 0.9 % injection 10-40 mL  10-40 mL Intracatheter PRN Charlynne Cousins, MD       sodium chloride flush (NS) 0.9 % injection 3 mL  3 mL Intravenous Lillia Mountain, MD   3 mL at 01/24/21 2127    Medications Prior to Admission  Medication Sig  Dispense Refill Last Dose   ceFAZolin (ANCEF) IVPB Inject 2 g into the vein every 8 (eight) hours. Indication:  vertebral osteomyelitis First Dose: No Last Day of Therapy:  02/24/2021 Labs - Once weekly:  CBC/D and BMP, Labs - Every other week:  ESR and CRP Method of administration: IV Push Method of administration may be changed at the discretion of home infusion pharmacist based upon assessment of the patient and/or caregiver's ability to self-administer the medication ordered. 120 Units 0    cholecalciferol (VITAMIN D3) 25 MCG (1000 UNIT) tablet Take 1,000 Units by mouth daily.      diclofenac (VOLTAREN) 75 MG EC tablet Take 75 mg by mouth 2 (two) times daily.      Multiple Vitamin (MULTIVITAMIN WITH MINERALS) TABS tablet Take 1 tablet by mouth daily.      oxyCODONE (OXY IR/ROXICODONE) 5 MG immediate release tablet Take 1 tablet (5 mg total) by mouth every 6 (six) hours as needed for moderate pain. 30 tablet 0    polyethylene glycol (MIRALAX / GLYCOLAX) 17 g packet Take 17 g by mouth 2 (two) times daily. 81 each 0     Family History  Problem Relation Age of Onset   Hypertension Mother    Arthritis Mother    Stomach cancer Father    Hypertension Brother      Review of Systems:   ROS     Cardiac Review of Systems: Y or  [    ]= no  Chest Pain [    ]   Resting SOB [   ]  Exertional SOB  [  x]  Orthopnea [  ]  Pedal Edema [ x  ]    Palpitations [  ] Syncope  [  ]   Presyncope [   ]  General Review of Systems: [Y] = yes [  ]=no Constitional: recent weight change [ x ]; anorexia [  ]; fatigue [  ]; nausea [  ]; night sweats [  ]; fever [  ]; or chills [  ]                                                                 Eye : blurred vision [  ]; diplopia [   ]; vision changes [  ];  Amaurosis fugax[  ]; Resp: cough [  ];  wheezing[  ];  hemoptysis[  ]; shortness of breath[ x ]; paroxysmal nocturnal dyspnea[  ]; dyspnea on exertion[  ]; or orthopnea[  ];  GI:  gallstones[  ],  vomiting[  ];  dysphagia[  ]; melena[  ];  hematochezia [  ]; heartburn[  ];   Hx of  Colonoscopy[  ]; GU: kidney stones [  ]; hematuria[  ];   dysuria [  ];  nocturia[  ];  history of     obstruction [  ]; urinary frequency [  ]             Skin: rash, swelling[  ];, hair loss[  ];  peripheral edema[  ];  or itching[  ]; Musculosketetal: myalgias[  ];  joint swelling[  ];  joint erythema[  ];  joint pain[ x ];  back pain[ x ];  Heme/Lymph: bruising[  ];  bleeding[  ];  anemia[  ];  Neuro: TIA[  ];  headaches[  ];  stroke[  ];  vertigo[  ];  seizures[  ];   paresthesias[  ];  difficulty walking[  ];  Psych:depression[  ]; anxiety[  ];  Endocrine: diabetes[  ];  thyroid dysfunction[  ];          Physical Exam: BP (!) 143/70 (BP Location: Left Arm)   Pulse 96   Temp 99.1 F (37.3 C) (Oral)   Resp 20   Wt 130.2 kg   SpO2 94%   BMI 44.96 kg/m    General appearance: alert, cooperative, and mild distress Head: Normocephalic, without obvious abnormality, atraumatic Resp: Breath sounds are clear anteriorly and laterally. Cardio: Regular rate and rhythm, monitor shows normal sinus rhythm. GI: Soft, nontender, active bowel sounds. Extremities: There is a PICC line in the right brachium.  Site is clean and dry.  He has bilateral lower extremity edema.  Both lower legs are wrapped.  I am not able to palpate distal pulses in his feet by history or warm and well-perfused. Neurologic: Mental status is appropriate, no gross focal neurologic deficits.  Diagnostic Studies & Laboratory data:     Recent Radiology Findings:   CLINICAL DATA:  Empyema, disseminated E coli infection question intra-abdominal abscess, sepsis secondary to discitis and osteomyelitis, E coli in an abscess near pelvic hardware, back pain   EXAM: CT CHEST, ABDOMEN, AND PELVIS WITH CONTRAST   TECHNIQUE: Multidetector CT imaging of the chest, abdomen and pelvis was performed following the standard protocol during  bolus administration of intravenous contrast. Sagittal and coronal MPR images reconstructed from  axial data set.   CONTRAST:  164m OMNIPAQUE IOHEXOL 300 MG/ML SOLN IV. No oral contrast.   COMPARISON:  CT abdomen and pelvis 01/10/2021   FINDINGS: CT CHEST FINDINGS   Cardiovascular: Minimal atherosclerotic calcification aorta without aneurysm. Heart unremarkable. No pericardial effusion. Pulmonary arteries grossly patent on nondedicated exam.   Mediastinum/Nodes: No thoracic adenopathy. RIGHT arm PICC line with tip in SVC. Base of cervical region normal appearance. Esophagus unremarkable.   Lungs/Pleura: Small loculated pleural fluid collections bilaterally RIGHT greater than LEFT which contain foci of air, could either represent empyema or prior instrumentation/aspiration. Compressive atelectasis in the lower lobes bilaterally. Loculated fluid versus less likely mass at medial RIGHT apex, recommend attention on follow-up imaging, 2.3 x 2.1 cm image 10, fluid attenuation. No infiltrate or additional mass identified.   Musculoskeletal: Spinal fixation hardware T10-S2. Thoracic portion of hardware appears intact. Lucency seen surrounding pedicle screws at T10 adjacent to loculated pleural collection at the inferior RIGHT chest medially favor infected hardware.   CT ABDOMEN PELVIS FINDINGS   Hepatobiliary: Gallbladder and liver normal appearance   Pancreas: Normal appearance   Spleen: Normal appearance   Adrenals/Urinary Tract: Adrenal glands, kidneys, ureters, and bladder normal appearance   Stomach/Bowel: Normal appendix. Stomach and bowel loops normal appearance.   Vascular/Lymphatic: Atherosclerotic calcifications aorta and iliac arteries without aneurysm. Normal sized retroperitoneal nodes.   Reproductive: Unremarkable seminal vesicles and prostate gland   Other: No free air or free fluid. Large fluid collection again identified LEFT retroperitoneal 10.1 x 6.4 x  7.1 cm, could represent infected or sterile collection. Trace free pelvic fluid. Scattered stranding of subcutaneous tissue planes.   Musculoskeletal: Orthopedic hardware T10-S2. LEFT hip prosthesis. Probable bone island RIGHT femoral neck.   IMPRESSION: Small loculated pleural fluid collections bilaterally RIGHT greater than LEFT which contain foci of air, could either represent empyema or prior instrumentation/aspiration, slightly increased.   Compressive atelectasis in the lower lobes.   Loculated fluid versus less likely mass at medial RIGHT apex 2.3 x 2.1 cm; recommend attention on follow-up imaging.   Large LEFT retroperitoneal fluid collection 10.1 x 6.4 x 7.1 cm little changed, could represent infected or sterile collection.   Lucency surrounding the pedicle screws bilaterally at T10 favoring infection, question slightly increased on RIGHT since previous study.   Aortic Atherosclerosis (ICD10-I70.0).     Electronically Signed   By: MLavonia DanaM.D.   On: 01/26/2021 11:40  CLINICAL DATA:  Empyema, disseminated E coli infection question intra-abdominal abscess, sepsis secondary to discitis and osteomyelitis, E coli in an abscess near pelvic hardware, back pain   EXAM: CT CHEST, ABDOMEN, AND PELVIS WITH CONTRAST   TECHNIQUE: Multidetector CT imaging of the chest, abdomen and pelvis was performed following the standard protocol during bolus administration of intravenous contrast. Sagittal and coronal MPR images reconstructed from axial data set.   CONTRAST:  1042mOMNIPAQUE IOHEXOL 300 MG/ML SOLN IV. No oral contrast.   COMPARISON:  CT abdomen and pelvis 01/10/2021   FINDINGS: CT CHEST FINDINGS   Cardiovascular: Minimal atherosclerotic calcification aorta without aneurysm. Heart unremarkable. No pericardial effusion. Pulmonary arteries grossly patent on nondedicated exam.   Mediastinum/Nodes: No thoracic adenopathy. RIGHT arm PICC line with tip in SVC. Base  of cervical region normal appearance. Esophagus unremarkable.   Lungs/Pleura: Small loculated pleural fluid collections bilaterally RIGHT greater than LEFT which contain foci of air, could either represent empyema or prior instrumentation/aspiration. Compressive atelectasis in the lower lobes bilaterally. Loculated fluid versus less likely mass at medial  RIGHT apex, recommend attention on follow-up imaging, 2.3 x 2.1 cm image 10, fluid attenuation. No infiltrate or additional mass identified.   Musculoskeletal: Spinal fixation hardware T10-S2. Thoracic portion of hardware appears intact. Lucency seen surrounding pedicle screws at T10 adjacent to loculated pleural collection at the inferior RIGHT chest medially favor infected hardware.   CT ABDOMEN PELVIS FINDINGS   Hepatobiliary: Gallbladder and liver normal appearance   Pancreas: Normal appearance   Spleen: Normal appearance   Adrenals/Urinary Tract: Adrenal glands, kidneys, ureters, and bladder normal appearance   Stomach/Bowel: Normal appendix. Stomach and bowel loops normal appearance.   Vascular/Lymphatic: Atherosclerotic calcifications aorta and iliac arteries without aneurysm. Normal sized retroperitoneal nodes.   Reproductive: Unremarkable seminal vesicles and prostate gland   Other: No free air or free fluid. Large fluid collection again identified LEFT retroperitoneal 10.1 x 6.4 x 7.1 cm, could represent infected or sterile collection. Trace free pelvic fluid. Scattered stranding of subcutaneous tissue planes.   Musculoskeletal: Orthopedic hardware T10-S2. LEFT hip prosthesis. Probable bone island RIGHT femoral neck.   IMPRESSION: Small loculated pleural fluid collections bilaterally RIGHT greater than LEFT which contain foci of air, could either represent empyema or prior instrumentation/aspiration, slightly increased.   Compressive atelectasis in the lower lobes.   Loculated fluid versus less likely mass  at medial RIGHT apex 2.3 x 2.1 cm; recommend attention on follow-up imaging.   Large LEFT retroperitoneal fluid collection 10.1 x 6.4 x 7.1 cm little changed, could represent infected or sterile collection.   Lucency surrounding the pedicle screws bilaterally at T10 favoring infection, question slightly increased on RIGHT since previous study.   Aortic Atherosclerosis (ICD10-I70.0).     Electronically Signed   By: Lavonia Dana M.D.   On: 01/26/2021 11:40     I have independently reviewed the above radiologic studies and discussed with the patient   Recent Lab Findings: Lab Results  Component Value Date   WBC 16.5 (H) 01/24/2021   HGB 9.9 (L) 01/24/2021   HCT 30.2 (L) 01/24/2021   PLT 380 01/24/2021   GLUCOSE 261 (H) 01/26/2021   ALT 13 01/20/2021   AST 37 01/20/2021   NA 127 (L) 01/26/2021   K 3.6 01/26/2021   CL 85 (L) 01/26/2021   CREATININE 1.08 01/26/2021   BUN 18 01/26/2021   CO2 30 01/26/2021   INR 1.0 11/13/2007   HGBA1C 5.9 (H) 01/11/2021      Assessment / Plan:    Mr. Moch is a pleasant 70 year old gentleman who is status post extensive thoracolumbar surgery for spinal stenosis in November 2021 who was recently admitted for sepsis.  This ultimately proved to be related to an E. coli abscess adjacent to the surgical hardware in his back.  After surgical drainage on 01/13/2021 and placement of a PICC line, he was discharged home with home antibiotic therapy.  He returned to the ED a few days later with complaint of cough, chest pain, and weight gain.  CT scan of the chest showed an empyema along with discitis/osteomyelitis T10.  His white blood count was 19,000.  He was readmitted to the hospital.  Bilateral chest tubes were placed on 01/20/2021 by the critical care medicine team.  The effusions were drained and the antibiotics were continued.  Once the pleural drainage had subsided, the left and right pleural tubes were removed on 01/23/2021 and 01/24/2021  respectively.  Follow-up CT scan obtained earlier today of the chest and abdomen was noted to have recurrent small bilateral effusions with loculations  suggestive of examinations have been reviewed by Dr. Kipp Brood.  He is not feel that require intervention is required at this time.  We will follow along with you and observe Mr. Rivard's progress over the next few days.     I  spent 25 minutes counseling the patient face to face.   Antony Odea, PA-C  01/26/2021 5:46 PM    Agree with above. This is a 70 year old gentleman with bilateral pleural effusions.  They have previously been treated with pigtail catheters.  Based on my review of the imaging there is a dominant collection in the posterior gutter on the right that is not very big.  In my opinion I feel that this would be amenable to an image guided catheter placement.  This can also be done on the left side.  I think that given his clinical status and the size of the effusions bilateral surgical decortication would be challenging for this patient to recover from.  Elier Zellars Bary Leriche

## 2021-01-27 ENCOUNTER — Inpatient Hospital Stay (HOSPITAL_COMMUNITY): Payer: BC Managed Care – PPO

## 2021-01-27 DIAGNOSIS — A419 Sepsis, unspecified organism: Secondary | ICD-10-CM | POA: Diagnosis not present

## 2021-01-27 DIAGNOSIS — T847XXD Infection and inflammatory reaction due to other internal orthopedic prosthetic devices, implants and grafts, subsequent encounter: Secondary | ICD-10-CM | POA: Diagnosis not present

## 2021-01-27 DIAGNOSIS — J869 Pyothorax without fistula: Secondary | ICD-10-CM | POA: Diagnosis not present

## 2021-01-27 DIAGNOSIS — G4733 Obstructive sleep apnea (adult) (pediatric): Secondary | ICD-10-CM | POA: Diagnosis not present

## 2021-01-27 DIAGNOSIS — M4620 Osteomyelitis of vertebra, site unspecified: Secondary | ICD-10-CM | POA: Diagnosis not present

## 2021-01-27 IMAGING — CT CT IMAGE GUIDED DRAINAGE BY PERCUTANEOUS CATHETER
1 of 3 series · 12 of 32 positions shown, 18 images · non-contrast
Comparison: CT chest abdomen pelvis, [DATE]

INDICATION: 70-year-old male status post back surgery with left retroperitoneal
collection.

EXAM:
CT IMAGE GUIDED DRAINAGE BY PERCUTANEOUS CATHETER

[Series 2: i-spiral 5.0 b40f · axial · 0.94mm/px · z∈[+928,+1110]mm · 12 of 62 slices shown, 18 images]
[im 5/62  soft-tissue]
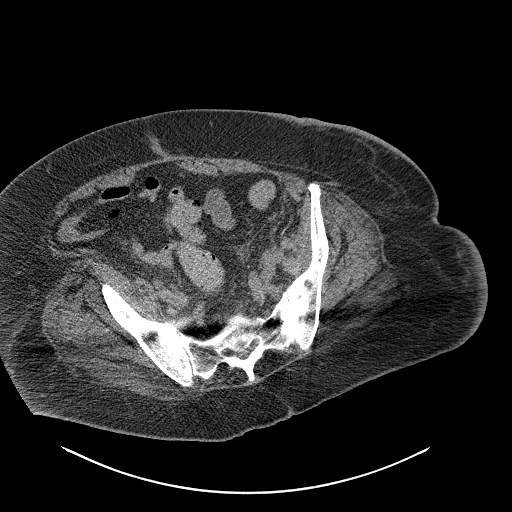
[im 5/62  bone]
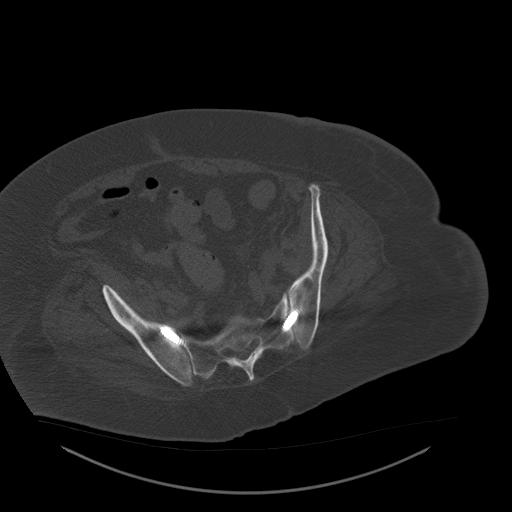
[im 10/62  soft-tissue]
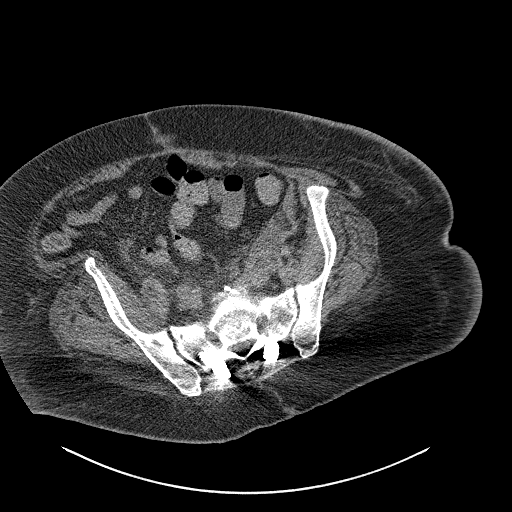
[im 15/62  soft-tissue]
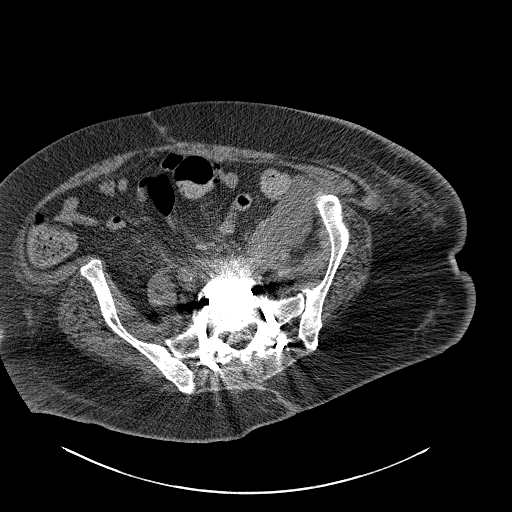
[im 19/62  soft-tissue]
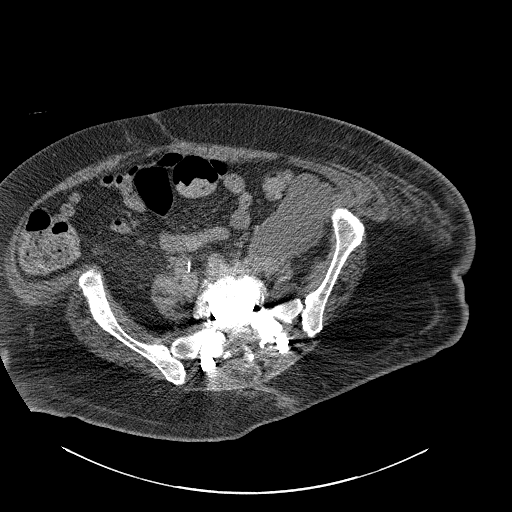
[im 24/62  soft-tissue]
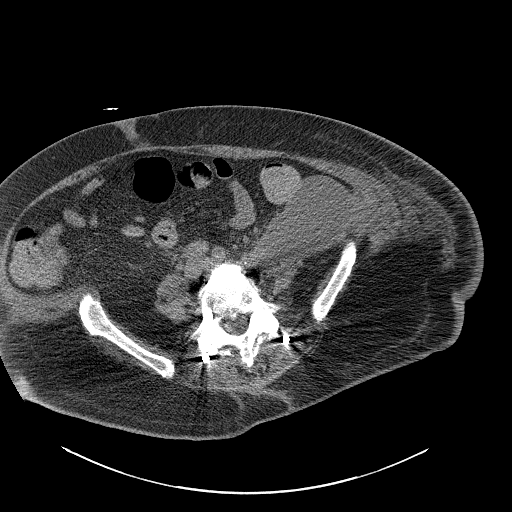
[im 29/62  soft-tissue]
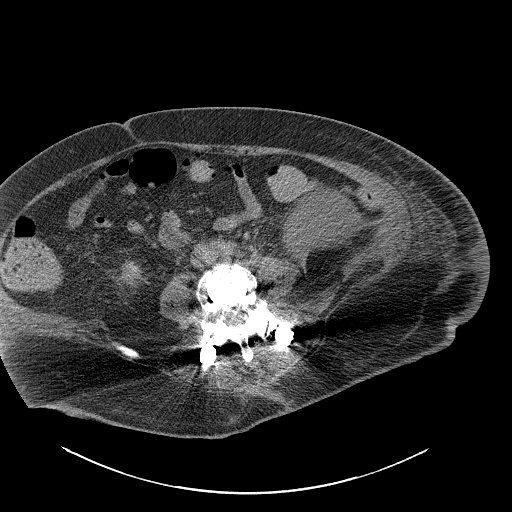
[im 33/62  soft-tissue]
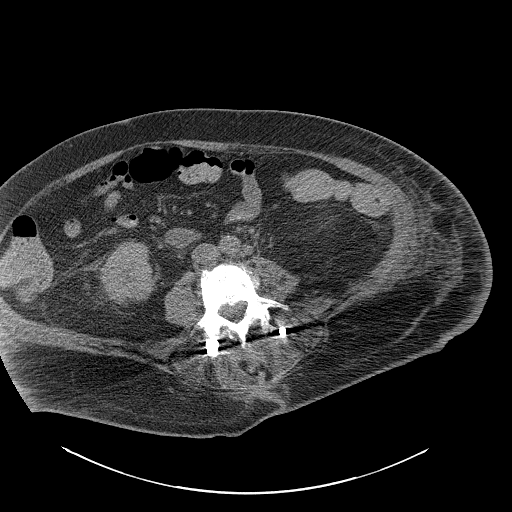
[im 38/62  soft-tissue]
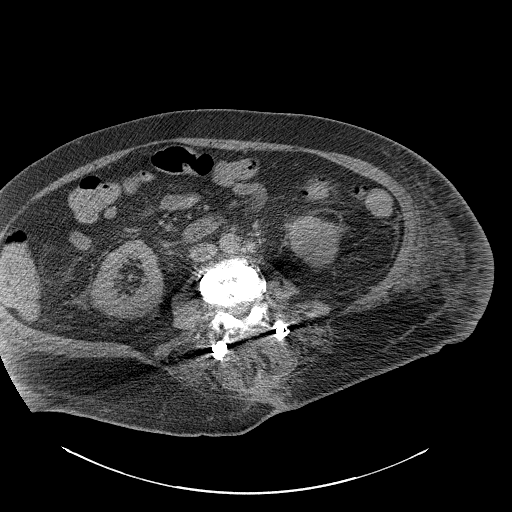
[im 43/62  soft-tissue]
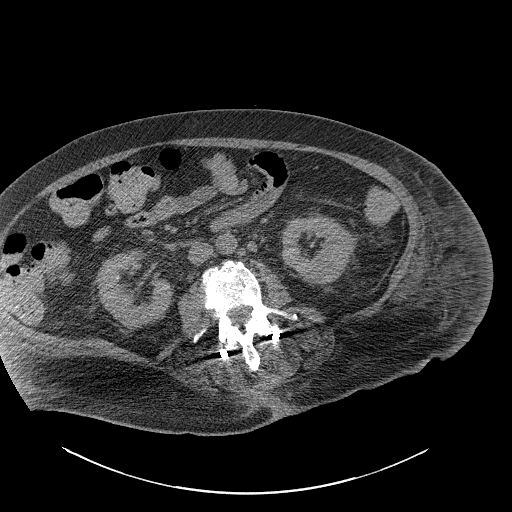
[im 43/62  lung]
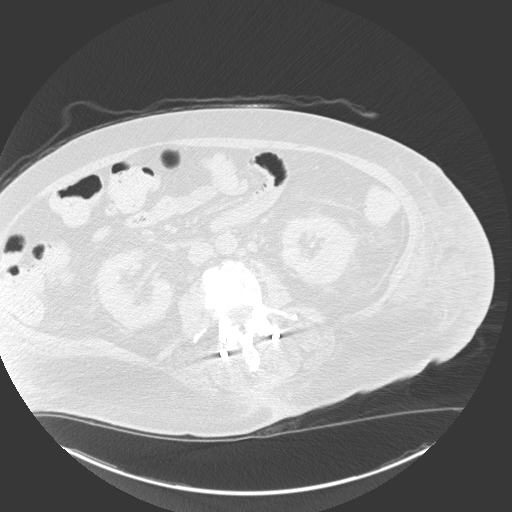
[im 43/62  bone]
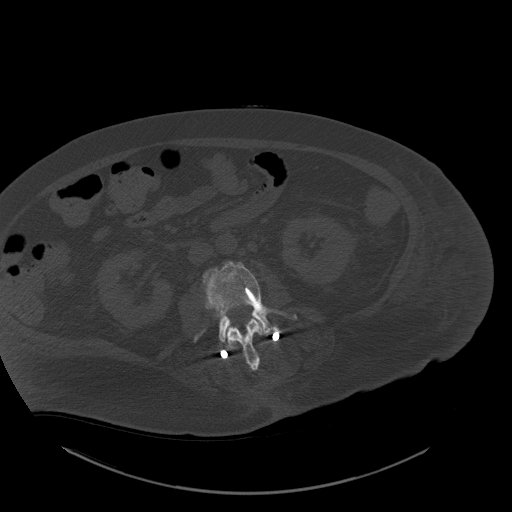
[im 47/62  soft-tissue]
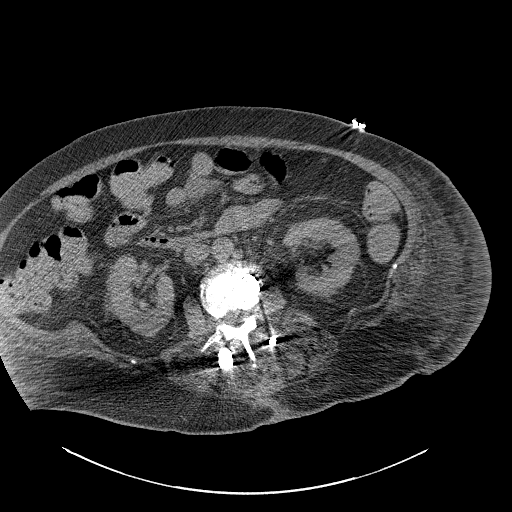
[im 47/62  lung]
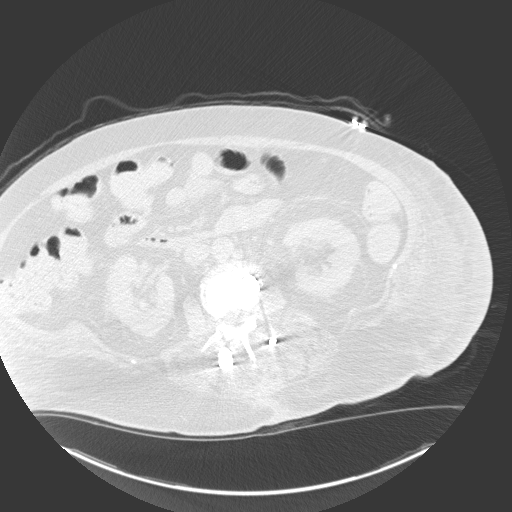
[im 52/62  soft-tissue]
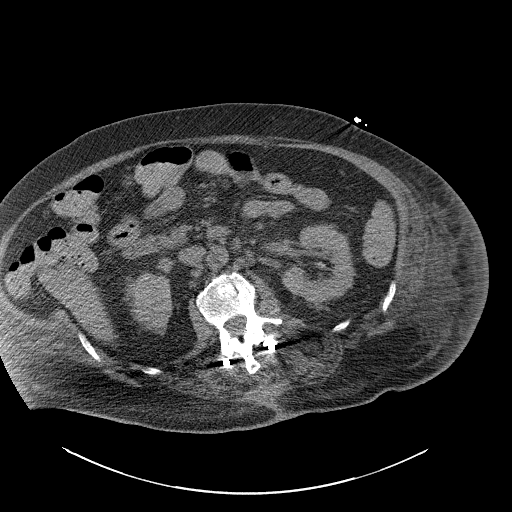
[im 52/62  lung]
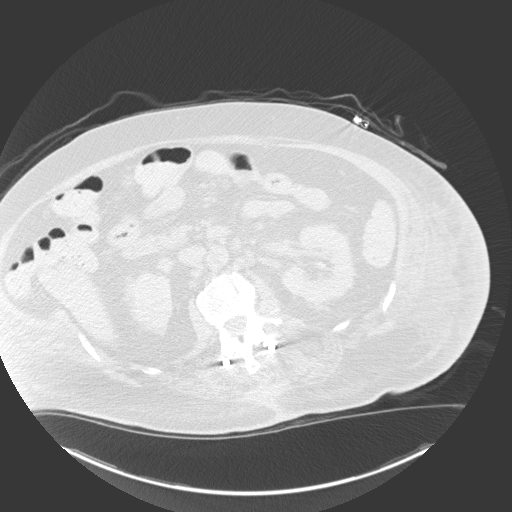
[im 57/62  soft-tissue]
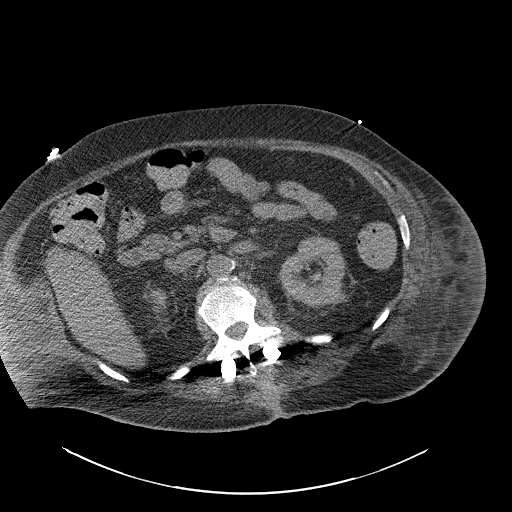
[im 57/62  lung]
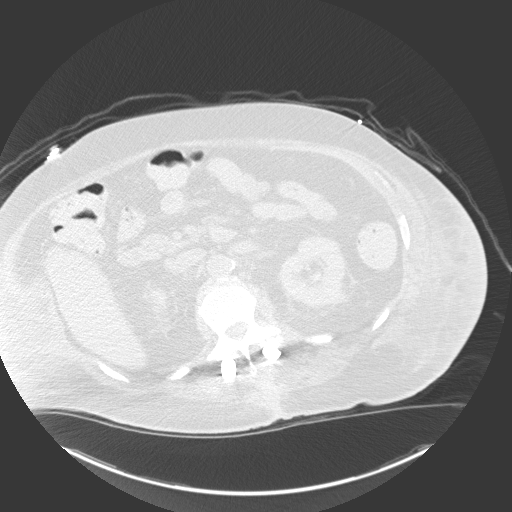

[12 of 32 positions shown; findings below may reference images not displayed]

MEDICATIONS:
The patient is currently admitted to the hospital and receiving
intravenous antibiotics. The antibiotics were administered within an
appropriate time frame prior to the initiation of the procedure.

ANESTHESIA/SEDATION:
Moderate (conscious) sedation was employed during this procedure. A
total of Versed 2 mg and Fentanyl 100 mcg was administered
intravenously.

Moderate Sedation Time: 23 minutes. The patient's level of
consciousness and vital signs were monitored continuously by
radiology nursing throughout the procedure under my direct
supervision.

CONTRAST:  None

COMPLICATIONS:
None immediate.

PROCEDURE:
Informed written consent was obtained from the patient after a
discussion of the risks, benefits and alternatives to treatment. The
patient was placed supine on the CT gantry and a pre procedural CT
was performed re-demonstrating the known abscess/fluid collection
within the left retroperitoneum. The procedure was planned. A
timeout was performed prior to the initiation of the procedure.

The left lower quadrant and flank was prepped and draped in the
usual sterile fashion. The overlying soft tissues were anesthetized
with 1% lidocaine with epinephrine. Appropriate trajectory was
planned with the use of a 22 gauge spinal needle. An 18 gauge trocar
needle was advanced into the abscess/fluid collection and a short
Amplatz super stiff wire was coiled within the collection.
Appropriate positioning was confirmed with a limited CT scan. The
tract was serially dilated allowing placement of a 12 French
all-purpose drainage catheter. Appropriate positioning was confirmed
with a limited postprocedural CT scan.

Forty ml of urine-colored fluid was aspirated. The tube was
connected to a drainage bag and sutured in place. A dressing was
placed. The patient tolerated the procedure well without immediate
post procedural complication.
IMPRESSION: Successful CT guided placement of a 12 French all purpose drain
catheter into the left retroperitoneum with aspiration of 40 mL of
urine-colored fluid. Samples were sent to the laboratory as
requested by the ordering clinical team.

## 2021-01-27 MED ORDER — ENOXAPARIN SODIUM 40 MG/0.4ML IJ SOSY
40.0000 mg | PREFILLED_SYRINGE | Freq: Every day | INTRAMUSCULAR | Status: DC
  Administered 2021-01-28 – 2021-02-03 (×7): 40 mg via SUBCUTANEOUS
  Filled 2021-01-27 (×7): qty 0.4

## 2021-01-27 MED ORDER — FENTANYL CITRATE (PF) 100 MCG/2ML IJ SOLN
INTRAMUSCULAR | Status: AC
  Filled 2021-01-27: qty 4

## 2021-01-27 MED ORDER — SODIUM CHLORIDE 0.9 % IV SOLN: INTRAVENOUS | Status: AC

## 2021-01-27 MED ORDER — MIDAZOLAM HCL 2 MG/2ML IJ SOLN
INTRAMUSCULAR | Status: AC
  Filled 2021-01-27: qty 4

## 2021-01-27 MED ORDER — MIDAZOLAM HCL 2 MG/2ML IJ SOLN
INTRAMUSCULAR | Status: AC | PRN
  Administered 2021-01-27: 0.5 mg via INTRAVENOUS
  Administered 2021-01-27: 1 mg via INTRAVENOUS
  Administered 2021-01-27: 0.5 mg via INTRAVENOUS

## 2021-01-27 MED ORDER — FENTANYL CITRATE (PF) 100 MCG/2ML IJ SOLN
INTRAMUSCULAR | Status: AC | PRN
  Administered 2021-01-27: 50 ug via INTRAVENOUS
  Administered 2021-01-27 (×2): 25 ug via INTRAVENOUS

## 2021-01-27 MED ORDER — LIDOCAINE HCL 1 % IJ SOLN
INTRAMUSCULAR | Status: AC
  Filled 2021-01-27: qty 10

## 2021-01-27 NOTE — Consult Note (Signed)
Chief Complaint: Patient was seen in consultation today for left retroperitoneal abscess aspiration/drain placement.  Referring Physician(s): Rosiland Oz, MD  Supervising Physician: Michaelle Birks  Patient Status: [facility] - In-pt  History of Present Illness: Marco Kennedy is a 70 y.o. male with a past medical history significant for OSA requiring CPAP at home, arthritis, scoliosis, spondylolisthesis, lumbar radiculopathy and lumbar spinal stenosis s/p anterior L2-3/L3-4/L4-5/L5-S1 fusion 04/18/20 with Dr. Vertell Limber who presented to the ED in [place] on 01/10/21 with worsening back pain, fever and chills. He underwent a CT scan which showed an abscess near the existing lumbar hardware and he was transferred to [facility] for further evaluation. He was admitted and underwent aspiration of the posterior lumbar subcutaneous fluid collection in IR on 7/17, culture was positive for rare e.coli. He then underwent thoracolumbar wound debridement in OR with neurosurgery on 7/19, cultures were also positive for rare e.coli. He was followed closely by ID and was discharged on 7/22 with plans to continue outpatient IV antibiotics.   Unfortunately he developed chest pain and lower extremity swelling so he presented to the ED in [place] on 7/25 and was ultimately transferred again to [facility] for neurosurgery/ID consultation. He was found to have bilateral pleural effusions and underwent bilateral chest tube placement on 7/26 with CCM, cultures did not show any growth. Left chest tube was removed on 7/29 and right chest tube was removed on 7/30. He underwent CT chest/abdomen was obtained due to concern for intra-abdominal abscess which showed a large left retroperitoneal fluid collection (10.1 x 6.4 x 7.1 cm) concerning for possible infected collection. IR has been consulted for possible aspiration/drainage.   [name] seen at bedside with his wife, he expresses frustration that he's not clear on the plan today and he  has heard several slightly different variations although he is aware that he will likely need another drain. He is concerned that the drain placement will be painful because he had a lot of pain when the chest tubes were put in. He and his wife are hopeful that he will qualify for inpatient rehab. He continues to have left sided chest pain with deep breathing as well as left sided abdominal and back pain. He has not had a bowel movement in about 5 days and does not have much of an appetite.  Past Medical History:  Diagnosis Date   Arthritis    arthritis-bilateral knees, left Hip.   Degenerative lumbar spinal stenosis    EKG abnormality    06-08-16- being evaluated by cardiologist  in [place]   Idiopathic scoliosis of lumbar region    Joint stiffness    Knee pain    Leg swelling    Low back pain    Achy, shooting pain.  Hurts to Walk or stand up straight.   Lumbar radiculopathy    Scoliosis concern    Sleep apnea    Bipap use nightly 25/19 -3l/m oxygen   Spondylolisthesis of lumbar region    Transfusion history    [place] ,[place] after bleeding post colon polyp removal and colonscopy procedure    Past Surgical History:  Procedure Laterality Date   ABDOMINAL EXPOSURE N/A 04/18/2020   Procedure: ABDOMINAL EXPOSURE;  Surgeon: Marty Heck, MD;  Location: [place];  Service: Vascular;  Laterality: N/A;   ANTERIOR LAT LUMBAR FUSION Left 04/18/2020   Procedure: ANTERIOR LATERAL LUMBAR FUSION LUMBAR TWO-THREE, LUMBAR THREE-FOUR;  Surgeon: Erline Levine, MD;  Location: [place];  Service: Neurosurgery;  Laterality: Left;  ANTERIOR LUMBAR FUSION N/A 04/18/2020   Procedure: Lumbar Four-Five Lumbar Five- Sacral One Anterior lumbar Interbody Fusion;  Surgeon: Erline Levine, MD;  Location: [place];  Service: Neurosurgery;  Laterality: N/A;   APPLICATION OF INTRAOPERATIVE CT SCAN N/A 04/21/2020   Procedure: APPLICATION OF INTRAOPERATIVE CT SCAN;  Surgeon: Erline Levine, MD;  Location: [place];   Service: Neurosurgery;  Laterality: N/A;   BACK SURGERY     lumbar disc surgery   COLONOSCOPY W/ POLYPECTOMY     HERNIA REPAIR Left    left inguinal hernia repair   KNEE ARTHROSCOPY Right 2003    -scope for meniscus   LUMBAR WOUND DEBRIDEMENT N/A 01/13/2021   Procedure: THORACOLUMBAR WOUND DEBRIDEMENT;  Surgeon: Erline Levine, MD;  Location: [place];  Service: Neurosurgery;  Laterality: N/A;   POSTERIOR LUMBAR FUSION 4 LEVEL N/A 04/21/2020   Procedure: Thoracic ten to pelvis pedicle screw fixation with osteotomies;  Surgeon: Erline Levine, MD;  Location: [place];  Service: Neurosurgery;  Laterality: N/A;   TOTAL HIP ARTHROPLASTY Left 06/15/2016   Procedure: LEFT TOTAL HIP ARTHROPLASTY ANTERIOR APPROACH;  Surgeon: Paralee Cancel, MD;  Location: [facility];  Service: Orthopedics;  Laterality: Left;    Allergies: Patient has no known allergies.  Medications: Prior to Admission medications   Medication Sig Start Date End Date Taking? Authorizing Provider  ceFAZolin (ANCEF) IVPB Inject 2 g into the vein every 8 (eight) hours. Indication:  vertebral osteomyelitis First Dose: No Last Day of Therapy:  02/24/2021 Labs - Once weekly:  CBC/D and BMP, Labs - Every other week:  ESR and CRP Method of administration: IV Push Method of administration may be changed at the discretion of home infusion pharmacist based upon assessment of the patient and/or caregiver's ability to self-administer the medication ordered. 01/15/21 02/24/21  Marvis Moeller, NP  cholecalciferol (VITAMIN D3) 25 MCG (1000 UNIT) tablet Take 1,000 Units by mouth daily.    [provider]  diclofenac (VOLTAREN) 75 MG EC tablet Take 75 mg by mouth 2 (two) times daily. 11/13/20   [provider]  Multiple Vitamin (MULTIVITAMIN WITH MINERALS) TABS tablet Take 1 tablet by mouth daily.    [provider]  oxyCODONE (OXY IR/ROXICODONE) 5 MG immediate release tablet Take 1 tablet (5 mg total) by mouth every 6 (six) hours  as needed for moderate pain. 01/15/21   Little Ishikawa, MD  polyethylene glycol (MIRALAX / GLYCOLAX) 17 g packet Take 17 g by mouth 2 (two) times daily. 01/15/21   Little Ishikawa, MD     Family History  Problem Relation Age of Onset   Hypertension Mother    Arthritis Mother    Stomach cancer Father    Hypertension Brother     Social History   Socioeconomic History   Marital status: Married    Spouse name: Not on file   Number of children: 2   Years of education: Not on file   Highest education level: Not on file  Occupational History   Not on file  Tobacco Use   Smoking status: Never   Smokeless tobacco: Never  Vaping Use   Vaping Use: Never used  Substance and Sexual Activity   Alcohol use: Yes    Comment: rare- social    Drug use: No   Sexual activity: Yes  Other Topics Concern   Not on file  Social History Narrative   Not on file   Social Determinants of Health   Financial Resource Strain: Not on file  Food Insecurity: Not on file  Transportation Needs: Not on file  Physical Activity: Not on file  Stress: Not on file  Social Connections: Not on file     Review of Systems: A 12 point ROS discussed and pertinent positives are indicated in the HPI above.  All other systems are negative.  Review of Systems  Constitutional:  Positive for appetite change. Negative for chills and fever.  Respiratory:  Negative for cough and shortness of breath.   Cardiovascular:  Positive for chest pain (left upper portion - with deep breathing).  Gastrointestinal:  Positive for abdominal pain (left upper mostly) and constipation. Negative for diarrhea, nausea and vomiting.  Genitourinary:  Negative for difficulty urinating.  Musculoskeletal:  Positive for back pain (left sided lower and mid back mostly).  Neurological:  Negative for headaches.   Vital Signs: BP 128/69 (BP Location: Left Arm)   Pulse 82   Temp 99.4 F (37.4 C) (Oral)   Resp 17   Wt 267 lb 3.2 oz  (121.2 kg)   SpO2 96%   BMI 41.85 kg/m   Physical Exam Vitals and nursing note reviewed.  Constitutional:      General: He is not in acute distress. HENT:     Head: Normocephalic.     Mouth/Throat:     Mouth: Mucous membranes are moist.     Pharynx: Oropharynx is clear. No oropharyngeal exudate or posterior oropharyngeal erythema.  Cardiovascular:     Rate and Rhythm: Normal rate and regular rhythm.  Pulmonary:     Effort: Pulmonary effort is normal.     Breath sounds: Normal breath sounds.     Comments: 1L supplemental O2 via [place] Abdominal:     Palpations: Abdomen is soft.     Comments: Decreased bowel sounds throughout. Mildly TTP LUQ  Skin:    General: Skin is warm and dry.  Neurological:     Mental Status: He is alert and oriented to person, place, and time.  Psychiatric:        Mood and Affect: Mood normal.        Behavior: Behavior normal.        Thought Content: Thought content normal.        Judgment: Judgment normal.     MD Evaluation Airway: WNL Heart: WNL Abdomen: WNL Chest/ Lungs: WNL ASA  Classification: 3 Mallampati/Airway Score: Two   Imaging: DG Chest 1 View  Result Date: 01/20/2021 CLINICAL DATA:  Status post bilateral chest tube placement after spread of spinal infection to the chest. EXAM: CHEST  1 VIEW COMPARISON:  Single-view of the chest 01/10/2021. FINDINGS: Pigtail catheters are in place bilaterally. The patient also has a right PICC with its tip in the upper to mid superior vena cava. No pneumothorax. Mild basilar opacities and small bilateral pleural effusions noted. Heart size is normal. IMPRESSION: Bilateral chest tubes in place.  No pneumothorax. Small bilateral pleural effusions. Mild basilar airspace disease is likely atelectasis. Tip of right PICC projects in the upper to mid superior vena cava. Electronically Signed   By: Inge Rise M.D.   On: 01/20/2021 19:53   MR THORACIC SPINE W WO CONTRAST  Result Date: 01/11/2021 CLINICAL  DATA:  70 year old male with fever and "low back abscess". Prior spine surgery in 2021. Approximately 1 week of increasing left side back and flank pain. Found to have large 9.6 x 7.7 x 10.8 cm left flank or pelvic loculated fluid collection at outside facility suspicious for seroma versus abscess EXAM:  MRI CERVICAL, THORACIC AND LUMBAR SPINE WITHOUT AND WITH CONTRAST TECHNIQUE: Multiplanar and multiecho pulse sequences of the cervical spine, to include the craniocervical junction and cervicothoracic junction, and thoracic and lumbar spine, were obtained without and with intravenous contrast. CONTRAST:  24m GADAVIST GADOBUTROL 1 MMOL/ML IV SOLN COMPARISON:  Scoliosis radiographs 08/14/2020 and earlier. FINDINGS: MRI THORACIC SPINE FINDINGS Limited cervical spine imaging: Straightening of cervical lordosis. Generalized decreased T1 bone marrow signal. Thoracic spine segmentation:  Normal on the comparison radiographs. Alignment:  Preserved thoracic kyphosis. Vertebrae: Mild generalized decreased T1 marrow signal in the thoracic spine, although remaining within normal limits from T9 and above. Superimposed T10 through lumbar posterior spinal hardware artifact. No convincing T10 through T12 marrow edema on the thoracic images. Cord: Abnormal dural thickening and enhancement extending from the lower thoracic spine which is affected by hardware artifact cephalad through the T4 level. Beginning at T7 and continuing inferiorly is a ventral epidural fluid collection with rim enhancement measuring up to 6 mm in thickness (at T8 series 27, image 25) which effaces the ventral CSF space at many lower thoracic levels. This continues toward the thoracolumbar junction, although thecal sac detail is limited by hardware artifact at T11 and T12. No definite associated cord edema. Above T4 the thecal sac and dura appear normal. Conus medullaris is obscured. Paraspinal and other soft tissues: Muscle postoperative changes. Suggesting  high protein content or blood. There is surrounding right thoracic erector spinae muscle edema, superimposed on chronic postoperative changes. Small to moderate layering bilateral pleural effusions are visible in the chest. Disc levels: Disc spaces are relatively normal for age above T9. Beginning in the midthoracic spine and continuing inferiorly is moderate and ultimately severe bilateral posterior facet hypertrophy. Beginning at T8-T9 there is multifactorial mild spinal stenosis in conjunction with the ventral epidural abscess. Similar mild spinal stenosis at T9-T10. The T10-T11 disc is abnormally T2 and STIR hyperintense, although evaluation for enhancement is limited due to hardware artifact. Possible mild endplate erosions there. Mild to moderate spinal stenosis there in part due to mass effect from moderate to severe posterior element hypertrophy on the right (series 27, image 32). T11-T12: disc space loss with disc bulging and endplate spurring. Up to moderate multifactorial spinal stenosis there. T12-L1 level is slightly better depicted on the lumbar images. MRI LUMBAR SPINE FINDINGS Segmentation:  Normal as seen on the comparison radiographs. Alignment:  Preserved lumbar lordosis. Vertebrae: Diffuse hardware susceptibility artifact, with interbody implants in addition to the posterior spinal hardware from L2-L3 through L5-S1. No definite lumbar marrow edema. No convincing sacral marrow edema. Grossly intact SI joints. Conus medullaris and cauda equina: Conus is obscured by hardware artifact. Cauda equina roots so in the lower lumbar thecal sac appear normal. Hardware artifact limits evaluation for abnormal dural thickening and enhancement in the lumbar spine, but the dura below L3 appears normal. Paraspinal and other soft tissues: Posterior paraspinal muscle postoperative changes. A small roughly 3 cm collection along the right side posterior T12 and L1 hardware has intrinsic T1 and T2 hyperintensity as  above. Otherwise there is thin midline subcutaneous fluid or granulation tissue which is not rim enhancing and fairly dark on T2. No psoas muscle abscess identified. Disc levels: T12-L1: This level remains fairly obscured due to hardware artifact, but on series 30, image 7 there does appear to be at least mild spinal stenosis. Unclear whether the epidural collection extends to this level. L1-L2: Obscured on axial images but on sagittal series 30, image 8 there is  no strong evidence of epidural abscess or spinal stenosis. L2-L3: Obscured on axial images. Sagittal images demonstrate interbody implant with no convincing epidural abscess. There does appear to be mild spinal stenosis. L3-L4: Sequelae of decompression and posterior, interbody fusion here. No definite spinal stenosis or epidural abscess. L4-L5: Decompression and fusion. Left side posterior element hypertrophy results in mild left L4 foraminal stenosis. No spinal stenosis or epidural abscess. L5-S1: Status post decompression and fusion. No stenosis or abscess. IMPRESSION: 1. Previous T10 through Sacral spinal fusion with abundant metal hardware artifact. 2. Positive for a Ventral Epidural Abscess in the thoracic spine tracking from the T7 superior endplate inferiorly at least to T10-T11 - where abnormal signal in the disc is highly suspicious for Infectious Discitis. Associated meningeal thickening and enhancement extending from that level cephalad to T4. 3. 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 4. No definite spinal cord edema, although detail of the conus is obscured by hardware artifact. 5. Solitary 3 cm posterior paraspinal fluid collection about the right T12 and L1 spinal rod, although fluid signal more resembles hematoma than abscess. 6. Otherwise extensive postoperative signal changes to the thoracolumbar erector spinae muscles, with ano obvious  intramuscular abscess. 7. No definite lumbar epidural abscess. Visible sacrum and SI joints also appear spared. Electronically Signed   By: Genevie Ann M.D.   On: 01/11/2021 04:44   MR Lumbar Spine W Wo Contrast  Result Date: 01/11/2021 CLINICAL DATA:  70 year old male with fever and "low back abscess". Prior spine surgery in 2021. Approximately 1 week of increasing left side back and flank pain. Found to have large 9.6 x 7.7 x 10.8 cm left flank or pelvic loculated fluid collection at outside facility suspicious for seroma versus abscess EXAM: MRI CERVICAL, THORACIC AND LUMBAR SPINE WITHOUT AND WITH CONTRAST TECHNIQUE: Multiplanar and multiecho pulse sequences of the cervical spine, to include the craniocervical junction and cervicothoracic junction, and thoracic and lumbar spine, were obtained without and with intravenous contrast. CONTRAST:  58m GADAVIST GADOBUTROL 1 MMOL/ML IV SOLN COMPARISON:  Scoliosis radiographs 08/14/2020 and earlier. FINDINGS: MRI THORACIC SPINE FINDINGS Limited cervical spine imaging: Straightening of cervical lordosis. Generalized decreased T1 bone marrow signal. Thoracic spine segmentation:  Normal on the comparison radiographs. Alignment:  Preserved thoracic kyphosis. Vertebrae: Mild generalized decreased T1 marrow signal in the thoracic spine, although remaining within normal limits from T9 and above. Superimposed T10 through lumbar posterior spinal hardware artifact. No convincing T10 through T12 marrow edema on the thoracic images. Cord: Abnormal dural thickening and enhancement extending from the lower thoracic spine which is affected by hardware artifact cephalad through the T4 level. Beginning at T7 and continuing inferiorly is a ventral epidural fluid collection with rim enhancement measuring up to 6 mm in thickness (at T8 series 27, image 25) which effaces the ventral CSF space at many lower thoracic levels. This continues toward the thoracolumbar junction, although thecal sac  detail is limited by hardware artifact at T11 and T12. No definite associated cord edema. Above T4 the thecal sac and dura appear normal. Conus medullaris is obscured. Paraspinal and other soft tissues: Muscle postoperative changes. Suggesting high protein content or blood. There is surrounding right thoracic erector spinae muscle edema, superimposed on chronic postoperative changes. Small to moderate layering bilateral pleural effusions are visible in the chest. Disc levels: Disc spaces are relatively normal for age above T9. Beginning in the midthoracic spine and continuing inferiorly is  moderate and ultimately severe bilateral posterior facet hypertrophy. Beginning at T8-T9 there is multifactorial mild spinal stenosis in conjunction with the ventral epidural abscess. Similar mild spinal stenosis at T9-T10. The T10-T11 disc is abnormally T2 and STIR hyperintense, although evaluation for enhancement is limited due to hardware artifact. Possible mild endplate erosions there. Mild to moderate spinal stenosis there in part due to mass effect from moderate to severe posterior element hypertrophy on the right (series 27, image 32). T11-T12: disc space loss with disc bulging and endplate spurring. Up to moderate multifactorial spinal stenosis there. T12-L1 level is slightly better depicted on the lumbar images. MRI LUMBAR SPINE FINDINGS Segmentation:  Normal as seen on the comparison radiographs. Alignment:  Preserved lumbar lordosis. Vertebrae: Diffuse hardware susceptibility artifact, with interbody implants in addition to the posterior spinal hardware from L2-L3 through L5-S1. No definite lumbar marrow edema. No convincing sacral marrow edema. Grossly intact SI joints. Conus medullaris and cauda equina: Conus is obscured by hardware artifact. Cauda equina roots so in the lower lumbar thecal sac appear normal. Hardware artifact limits evaluation for abnormal dural thickening and enhancement in the lumbar spine, but the  dura below L3 appears normal. Paraspinal and other soft tissues: Posterior paraspinal muscle postoperative changes. A small roughly 3 cm collection along the right side posterior T12 and L1 hardware has intrinsic T1 and T2 hyperintensity as above. Otherwise there is thin midline subcutaneous fluid or granulation tissue which is not rim enhancing and fairly dark on T2. No psoas muscle abscess identified. Disc levels: T12-L1: This level remains fairly obscured due to hardware artifact, but on series 30, image 7 there does appear to be at least mild spinal stenosis. Unclear whether the epidural collection extends to this level. L1-L2: Obscured on axial images but on sagittal series 30, image 8 there is no strong evidence of epidural abscess or spinal stenosis. L2-L3: Obscured on axial images. Sagittal images demonstrate interbody implant with no convincing epidural abscess. There does appear to be mild spinal stenosis. L3-L4: Sequelae of decompression and posterior, interbody fusion here. No definite spinal stenosis or epidural abscess. L4-L5: Decompression and fusion. Left side posterior element hypertrophy results in mild left L4 foraminal stenosis. No spinal stenosis or epidural abscess. L5-S1: Status post decompression and fusion. No stenosis or abscess. IMPRESSION: 1. Previous T10 through Sacral spinal fusion with abundant metal hardware artifact. 2. Positive for a Ventral Epidural Abscess in the thoracic spine tracking from the T7 superior endplate inferiorly at least to T10-T11 - where abnormal signal in the disc is highly suspicious for Infectious Discitis. Associated meningeal thickening and enhancement extending from that level cephalad to T4. 3. 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 4. No definite spinal cord edema, although detail of the conus is obscured by hardware artifact. 5. Solitary 3 cm posterior  paraspinal fluid collection about the right T12 and L1 spinal rod, although fluid signal more resembles hematoma than abscess. 6. Otherwise extensive postoperative signal changes to the thoracolumbar erector spinae muscles, with ano obvious intramuscular abscess. 7. No definite lumbar epidural abscess. Visible sacrum and SI joints also appear spared. Electronically Signed   By: Genevie Ann M.D.   On: 01/11/2021 04:44   CT CHEST ABDOMEN PELVIS W CONTRAST  Result Date: 01/26/2021 CLINICAL DATA:  Empyema, disseminated E coli infection question intra-abdominal abscess, sepsis secondary to discitis and osteomyelitis, E coli in an abscess near pelvic hardware, back pain EXAM: CT  CHEST, ABDOMEN, AND PELVIS WITH CONTRAST TECHNIQUE: Multidetector CT imaging of the chest, abdomen and pelvis was performed following the standard protocol during bolus administration of intravenous contrast. Sagittal and coronal MPR images reconstructed from axial data set. CONTRAST:  171m OMNIPAQUE IOHEXOL 300 MG/ML SOLN IV. No oral contrast. COMPARISON:  CT abdomen and pelvis 01/10/2021 FINDINGS: CT CHEST FINDINGS Cardiovascular: Minimal atherosclerotic calcification aorta without aneurysm. Heart unremarkable. No pericardial effusion. Pulmonary arteries grossly patent on nondedicated exam. Mediastinum/Nodes: No thoracic adenopathy. RIGHT arm PICC line with tip in SVC. Base of cervical region normal appearance. Esophagus unremarkable. Lungs/Pleura: Small loculated pleural fluid collections bilaterally RIGHT greater than LEFT which contain foci of air, could either represent empyema or prior instrumentation/aspiration. Compressive atelectasis in the lower lobes bilaterally. Loculated fluid versus less likely mass at medial RIGHT apex, recommend attention on follow-up imaging, 2.3 x 2.1 cm image 10, fluid attenuation. No infiltrate or additional mass identified. Musculoskeletal: Spinal fixation hardware T10-S2. Thoracic portion of hardware appears  intact. Lucency seen surrounding pedicle screws at T10 adjacent to loculated pleural collection at the inferior RIGHT chest medially favor infected hardware. CT ABDOMEN PELVIS FINDINGS Hepatobiliary: Gallbladder and liver normal appearance Pancreas: Normal appearance Spleen: Normal appearance Adrenals/Urinary Tract: Adrenal glands, kidneys, ureters, and bladder normal appearance Stomach/Bowel: Normal appendix. Stomach and bowel loops normal appearance. Vascular/Lymphatic: Atherosclerotic calcifications aorta and iliac arteries without aneurysm. Normal sized retroperitoneal nodes. Reproductive: Unremarkable seminal vesicles and prostate gland Other: No free air or free fluid. Large fluid collection again identified LEFT retroperitoneal 10.1 x 6.4 x 7.1 cm, could represent infected or sterile collection. Trace free pelvic fluid. Scattered stranding of subcutaneous tissue planes. Musculoskeletal: Orthopedic hardware T10-S2. LEFT hip prosthesis. Probable bone island RIGHT femoral neck. IMPRESSION: Small loculated pleural fluid collections bilaterally RIGHT greater than LEFT which contain foci of air, could either represent empyema or prior instrumentation/aspiration, slightly increased. Compressive atelectasis in the lower lobes. Loculated fluid versus less likely mass at medial RIGHT apex 2.3 x 2.1 cm; recommend attention on follow-up imaging. Large LEFT retroperitoneal fluid collection 10.1 x 6.4 x 7.1 cm little changed, could represent infected or sterile collection. Lucency surrounding the pedicle screws bilaterally at T10 favoring infection, question slightly increased on RIGHT since previous study. Aortic Atherosclerosis (ICD10-I70.0). Electronically Signed   By: MLavonia DanaM.D.   On: 01/26/2021 11:40   DG CHEST PORT 1 VIEW  Result Date: 01/23/2021 CLINICAL DATA:  Pleural effusion. EXAM: PORTABLE CHEST 1 VIEW COMPARISON:  01/22/2021 FINDINGS: A right chest tube is unchanged. The left chest tube has been  removed. A right PICC remains in place. The cardiac silhouette remains mildly enlarged. Bibasilar opacities are unchanged and likely represent a combination of small pleural effusions and atelectasis. No pneumothorax is identified. IMPRESSION: Interval left chest tube removal. Unchanged small pleural effusions and bibasilar atelectasis. No pneumothorax. Electronically Signed   By: ALogan BoresM.D.   On: 01/23/2021 08:23   DG CHEST PORT 1 VIEW  Result Date: 01/22/2021 CLINICAL DATA:  Pleural effusion.  Bilateral chest tubes. EXAM: PORTABLE CHEST 1 VIEW COMPARISON:  01/20/2021 FINDINGS: Bilateral chest tubes in stable position. No pneumothorax. Right PICC line in stable position. Stable cardiomegaly. Persistent bibasilar atelectasis/infiltrates and small bilateral pleural effusions. Degenerative change thoracic spine. Prior thoracolumbar spine fusion. IMPRESSION: 1.  Bilateral chest tubes in stable position.  No pneumothorax. 2. Persistent bibasilar atelectasis/infiltrates and small bilateral pleural effusions. 3.  Stable cardiomegaly. Electronically Signed   By: TMarcello Moores Register   On: 01/22/2021 06:25  DG Chest Portable 1 View  Result Date: 01/10/2021 CLINICAL DATA:  Fever, lower back abscess EXAM: PORTABLE CHEST 1 VIEW COMPARISON:  11/13/2007 FINDINGS: Single frontal view of the chest demonstrates an unremarkable cardiac silhouette. No airspace disease, effusion, or pneumothorax. Postsurgical changes are seen at the thoracolumbar junction. No acute displaced fractures. IMPRESSION: 1. No acute intrathoracic process. Electronically Signed   By: Randa Ngo M.D.   On: 01/10/2021 22:58   ECHOCARDIOGRAM COMPLETE  Result Date: 01/21/2021    ECHOCARDIOGRAM REPORT   Patient Name:   JUSTIS CLOSSER Date of Exam: 01/21/2021 Medical Rec #:  [id number]        Height:       67.0 in Accession #:    [id number]       Weight:       269.4 lb Date of Birth:  1950-12-27        BSA:          2.295 m Patient Age:    83  years         BP:           144/73 mmHg Patient Gender: M                HR:           109 bpm. Exam Location:  Inpatient Procedure: 2D Echo, Color Doppler, Cardiac Doppler and Intracardiac            Opacification Agent Indications:    Endocarditis  History:        Patient has no prior history of Echocardiogram examinations.  Sonographer:    Bernadene Person RDCS Referring Phys: Story  1. Left ventricular ejection fraction, by estimation, is 65 to 70%. The left ventricle has normal function. The left ventricle has no regional wall motion abnormalities. There is mild left ventricular hypertrophy. Left ventricular diastolic parameters were normal.  2. Right ventricular systolic function is normal. The right ventricular size is normal. Tricuspid regurgitation signal is inadequate for assessing PA pressure.  3. The mitral valve is normal in structure. No evidence of mitral valve regurgitation.  4. The aortic valve was not well visualized. Aortic valve regurgitation is not visualized. No aortic stenosis is present.  5. The inferior vena cava is normal in size with greater than 50% respiratory variability, suggesting right atrial pressure of 3 mmHg. Conclusion(s)/Recommendation(s): No vegetation seen. If high clinical suspicion for endocarditis, consider TEE. FINDINGS  Left Ventricle: Left ventricular ejection fraction, by estimation, is 65 to 70%. The left ventricle has normal function. The left ventricle has no regional wall motion abnormalities. Definity contrast agent was given IV to delineate the left ventricular  endocardial borders. The left ventricular internal cavity size was normal in size. There is mild left ventricular hypertrophy. Left ventricular diastolic parameters were normal. Right Ventricle: The right ventricular size is normal. No increase in right ventricular wall thickness. Right ventricular systolic function is normal. Tricuspid regurgitation signal is inadequate for  assessing PA pressure. Left Atrium: Left atrial size was normal in size. Right Atrium: Right atrial size was normal in size. Pericardium: There is no evidence of pericardial effusion. Mitral Valve: The mitral valve is normal in structure. No evidence of mitral valve regurgitation. Tricuspid Valve: The tricuspid valve is normal in structure. Tricuspid valve regurgitation is trivial. Aortic Valve: The aortic valve was not well visualized. Aortic valve regurgitation is not visualized. No aortic stenosis is present. Pulmonic Valve: The pulmonic valve was not well  visualized. Pulmonic valve regurgitation is not visualized. Aorta: The aortic root and ascending aorta are structurally normal, with no evidence of dilitation. Venous: The inferior vena cava is normal in size with greater than 50% respiratory variability, suggesting right atrial pressure of 3 mmHg. IAS/Shunts: The interatrial septum was not well visualized.  LEFT VENTRICLE PLAX 2D LVIDd:         4.80 cm  Diastology LVIDs:         2.80 cm  LV e' medial:    8.58 cm/s LV PW:         1.30 cm  LV E/e' medial:  8.6 LV IVS:        1.20 cm  LV e' lateral:   10.40 cm/s LVOT diam:     2.30 cm  LV E/e' lateral: 7.1 LV SV:         73 LV SV Index:   32 LVOT Area:     4.15 cm  RIGHT VENTRICLE RV S prime:     14.50 cm/s TAPSE (M-mode): 2.3 cm LEFT ATRIUM             Index       RIGHT ATRIUM           Index LA diam:        4.00 cm 1.74 cm/m  RA Area:     15.30 cm LA Vol (A2C):   43.1 ml 18.78 ml/m RA Volume:   31.10 ml  13.55 ml/m LA Vol (A4C):   38.6 ml 16.82 ml/m LA Biplane Vol: 42.9 ml 18.69 ml/m  AORTIC VALVE LVOT Vmax:   130.50 cm/s LVOT Vmean:  86.650 cm/s LVOT VTI:    0.175 m  AORTA Ao Root diam: 3.40 cm Ao Asc diam:  3.70 cm MITRAL VALVE MV Area (PHT): 4.44 cm     SHUNTS MV Decel Time: 171 msec     Systemic VTI:  0.18 m MV E velocity: 73.50 cm/s   Systemic Diam: 2.30 cm MV A velocity: 125.00 cm/s MV E/A ratio:  0.59 Oswaldo Milian MD Electronically signed  by Oswaldo Milian MD Signature Date/Time: 01/21/2021/3:27:40 PM    Final    [place] FNA SOFT TISSUE  Result Date: 01/19/2021 CLINICAL DATA:  Previous instrumented spine surgery. Fever. MR suggests ventral epidural abscess as well as posterior paraspinal subcutaneous fluid collection adjacent to the posterior spinal hardware. Aspiration requested EXAM: ULTRASOUND GUIDED ASPIRATION BIOPSY OF POSTERIOR LUMBAR SUBCUTANEOUS FLUID MEDICATIONS: lidocaine 1% subcutaneous PROCEDURE: The procedure, risks, benefits, and alternatives were explained to the patient. Questions regarding the procedure were encouraged and answered. The patient understands and consents to the procedure. the dorsal subcutaneous fluid collection was localized. An appropriate skin entry site was determined and marked. The operative field was prepped with chlorhexidine in a sterile fashion, and a sterile drape was applied covering the operative field. A sterile gown and sterile gloves were used for the procedure. Local anesthesia was provided with 1% Lidocaine. Under real-time ultrasound guidance, 18 gauge spinal needle advanced to the collection. Approximately 2 mL of 1 apparent old blood were aspirated, sent for the requested laboratory studies. The patient tolerated the procedure well. COMPLICATIONS: None. FINDINGS: Scant return from dorsal subcutaneous collection, sent for the requested laboratory studies. IMPRESSION: Technically successful aspiration of dorsal subcutaneous collection returning scant old blood, sent for requested laboratory studies. Electronically Signed   By: Lucrezia Europe M.D.   On: 01/19/2021 15:39   [place] EKG SITE RITE  Result Date: 01/15/2021 If [facility]  image not attached, placement could not be confirmed due to current cardiac rhythm.  US Abdomen Limited RUQ (LIVER/GB)  Result Date: 01/11/2021 CLINICAL DATA:  Elevated transaminase EXAM: ULTRASOUND ABDOMEN LIMITED RIGHT UPPER QUADRANT COMPARISON:  None. FINDINGS:  Gallbladder: 10 mm gallbladder polyp, although without vascularity. No gallstones or gallbladder wall thickening. Negative sonographic Murphy's sign. Common bile duct: Diameter: 4 mm Liver: Hyperechoic hepatic parenchyma. No focal hepatic lesion is seen. Portal vein is patent on color Doppler imaging with normal direction of blood flow towards the liver. Other: None. IMPRESSION: Hyperechoic hepatic parenchyma, suggesting hepatic steatosis. 10 mm gallbladder polyp. Given size, outpatient surgical consultation is suggested for consideration of elective cholecystectomy. If a conservative approach is desired, this is amenable to annual follow-up ultrasound. Electronically Signed   By: Julian Hy M.D.   On: 01/11/2021 01:57    Labs:  CBC: Recent Labs    01/15/21 0600 01/20/21 2039 01/21/21 0025 01/24/21 0253  WBC 20.6* 29.0* 23.2* 16.5*  HGB 10.8* 8.9* 9.7* 9.9*  HCT 33.3* 28.4* 30.3* 30.2*  PLT 274 594* 475* 380    COAGS: No results for input(s): INR, APTT in the last 8760 hours.  BMP: Recent Labs    01/20/21 2039 01/21/21 0025 01/24/21 0253 01/26/21 1432  NA 134* 134* 129* 127*  K 4.2 4.1 4.6 3.6  CL 99 98 92* 85*  CO2 _0 GLUCOSE 162* 152* 184* 261*  BUN _1 CALCIUM 7.9* 7.6* 7.6* 7.8*  CREATININE 0.81 0.86 0.81 1.08  GFRNONAA >60 >60 >60 >60    LIVER FUNCTION TESTS: Recent Labs    01/13/21 0048 01/14/21 0256 01/15/21 0600 01/20/21 2039  BILITOT 1.7* 1.4* 1.3* 0.5  AST 131* 137* 66* 37  ALT 101* 98* 70* 13  ALKPHOS 94 87 81 92  PROT 5.3* 4.9* 4.8* 5.1*  ALBUMIN 2.0* 1.9* 1.9* 1.7*    TUMOR MARKERS: No results for input(s): AFPTM, CEA, CA199, CHROMGRNA in the last 8760 hours.  Assessment and Plan:  70 y/o M with history of lumbar spinal stenosis s/p anterior L2-3/L3-4/L4-5/L5-S1 fusion 04/18/20 with Dr. Vertell Limber who presented to the ED in [place] on 01/10/21 with worsening back pain, fever and chills found to have abscess near existing hardware.  Aspirate of the SQ fluid collection as well as samples from wound debridement by neurosurgery showed rare e.coli. He was discharged on IV antibiotics however shortly developed chest pain and anasarca, he was readmitted and found to have bilateral pleural effusions - chest tubes were placed by CCM and subsequently removed after cultures were negative and drainage had decreased.  Repeat CT chest/abdomen/pelvis w/contrast on 8/1 showed large left retroperitoneal fluid collection and IR has been consulted for aspiration and possible drain placement. Patient history and imaging reviewed by Dr. Maryelizabeth Kaufmann who approves procedure.  Patient tentatively planned for percutaneous aspiration/drain placement today in CT - per ID request for gram stain and aerobic/anaerobic culture. Patient has been NPO since midnight per his report.   Risks and benefits discussed with the patient including bleeding, infection, damage to adjacent structures, bowel perforation/fistula connection, and sepsis.  All of the patient's questions were answered, patient is agreeable to proceed.  Consent signed and in chart.  IR will follow drain post placement and coordinate removal when appropriate. Please call with questions or concerns.  Thank you for this interesting consult.  I greatly enjoyed meeting DALLON DACOSTA and look forward to participating in their care.  A copy of this report  was sent to the requesting provider on this date.  Electronically Signed: Joaquim Nam, PA-C 01/27/2021, 9:34 AM   I spent a total of 40 Minutes  in face to face in clinical consultation, greater than 50% of which was counseling/coordinating care for left retroperitoneal abscess aspiration/drain placement.

## 2021-01-27 NOTE — Progress Notes (Addendum)
[facility] PROGRESS NOTE    Progress Note  Marco Kennedy  LPF:[id number] DOB: 1950-09-08 DOA: 01/20/2021 PCP: Maximiano Coss, MD     Brief Narrative:   Marco Kennedy is an 70 y.o. male past medical history of obstructive sleep apnea on CPAP recent discharge from the hospital 01/10/2021 for sepsis secondary to moderate-sized abscess near the pelvic hardware during this time neurosurgery IR and ID were involved in his care he underwent wound debridement on 01/13/2021 status post back surgery insertion of hardware, E. coli grew from wound, was treated with IV cefazolin and the plan was to continue 6 weeks of IV antibiotics via PICC line.  Went home on 01/16/2021 started complaining of rib pain he has gained 10 pounds since hospital discharge.  Since discharge she has been taking diclofenac twice a day for knee pain.  He started having fevers, transfer from [facility] with sepsis hypoxic which improved with 4 L of oxygen.  CT of the chest show empyema as well as discitis osteomyelitis of T10 with a white blood cell count of 10 he was started empirically on IV vancomycin and Ancef.  Neurosurgery consulted in the ED recommended no surgical intervention.  Due to his bilateral pleural effusions pulmonary was consulted to perform bilateral PEG tube placement which is now removed cultures from that have remained negative.  He is currently on IV cefazolin through PICC line Assessment/Plan:   Sepsis secondary from discitis and osteomyelitis and bilateral empyema in the setting of hardware: Blood cultures remain negative till date. Continue IV continue IV cefazolin 2D echo showed no wall motion abnormalities, no TEE at this point in time. ID also recommended to leave PICC line in place if culture negative. Neurosurgery was consulted who recommended no further hardware washout or removal.  Recommend an MRI in 3 to 4 weeks. ID is on board who recommended a CT scan of the chest  abdomen and pelvis which showed empyema with loculation, CT surgery has been consulted and awaiting further recommendations. CT scan of the abdomen pelvis also fluid collections in the psoas see below for the details Neurosurgery was reach out due to the lucency surrounding the hardware at T10 who recommended no acute indication for intervention Physical therapy reevaluated the patient and recommended inpatient rehab  New large returning fluid collection in the left retroperitoneal area: IR has been consulted for percutaneous drain Currently NPO.  Acute respiratory failure with hypoxia secondary to bilateral empyema: Pulmonary was consulted bilateral pigtail catheter pleural space placed, cultures are negative till date. Both chest tube has been discontinued.   CT of the chest showed empyema with loculation CT surgery was consulted and awaiting further recommendations  Anasarca: Likely due to chronic inflammation in the setting of hypoalbuminemia. Consult nutrition start him on Ensure 3 times daily. Basic metabolic panels pending today.  Obstructive sleep apnea: Continue CPAP at night.  Constipation: On MiraLAX as needed.  Obesity: With a BMI of 42 counseling.  DVT prophylaxis: lovenox Family Communication:none Status is: Inpatient  Remains inpatient appropriate because:Hemodynamically unstable  Dispo: The patient is from: Home              Anticipated d/c is to: SNF              Patient currently is not medically stable to d/c.   Difficult to place patient No        Code Status:     Code Status Orders  (From admission, onward)  Start     Ordered   01/20/21 1741  Full code  Continuous        01/20/21 1749           Code Status History     Date Active Date Inactive Code Status Order ID Comments User Context   01/11/2021 0450 01/16/2021 2221 Full Code [id number]  Darlin DropHall, Carole N, DO Inpatient   04/18/2020 1425 05/08/2020 0124 Full Code [id number]   Maeola HarmanStern, [place], MD Inpatient   06/15/2016 1801 06/16/2016 1832 Full Code [id number]  Lanney GinsBabish, Matthew, PA-C Inpatient         IV Access:   Peripheral IV   Procedures and diagnostic studies:   CT CHEST ABDOMEN PELVIS W CONTRAST  Result Date: 01/26/2021 CLINICAL DATA:  Empyema, disseminated E coli infection question intra-abdominal abscess, sepsis secondary to discitis and osteomyelitis, E coli in an abscess near pelvic hardware, back pain EXAM: CT CHEST, ABDOMEN, AND PELVIS WITH CONTRAST TECHNIQUE: Multidetector CT imaging of the chest, abdomen and pelvis was performed following the standard protocol during bolus administration of intravenous contrast. Sagittal and coronal MPR images reconstructed from axial data set. CONTRAST:  100mL OMNIPAQUE IOHEXOL 300 MG/ML SOLN IV. No oral contrast. COMPARISON:  CT abdomen and pelvis 01/10/2021 FINDINGS: CT CHEST FINDINGS Cardiovascular: Minimal atherosclerotic calcification aorta without aneurysm. Heart unremarkable. No pericardial effusion. Pulmonary arteries grossly patent on nondedicated exam. Mediastinum/Nodes: No thoracic adenopathy. RIGHT arm PICC line with tip in SVC. Base of cervical region normal appearance. Esophagus unremarkable. Lungs/Pleura: Small loculated pleural fluid collections bilaterally RIGHT greater than LEFT which contain foci of air, could either represent empyema or prior instrumentation/aspiration. Compressive atelectasis in the lower lobes bilaterally. Loculated fluid versus less likely mass at medial RIGHT apex, recommend attention on follow-up imaging, 2.3 x 2.1 cm image 10, fluid attenuation. No infiltrate or additional mass identified. Musculoskeletal: Spinal fixation hardware T10-S2. Thoracic portion of hardware appears intact. Lucency seen surrounding pedicle screws at T10 adjacent to loculated pleural collection at the inferior RIGHT chest medially favor infected hardware. CT ABDOMEN PELVIS FINDINGS Hepatobiliary: Gallbladder and  liver normal appearance Pancreas: Normal appearance Spleen: Normal appearance Adrenals/Urinary Tract: Adrenal glands, kidneys, ureters, and bladder normal appearance Stomach/Bowel: Normal appendix. Stomach and bowel loops normal appearance. Vascular/Lymphatic: Atherosclerotic calcifications aorta and iliac arteries without aneurysm. Normal sized retroperitoneal nodes. Reproductive: Unremarkable seminal vesicles and prostate gland Other: No free air or free fluid. Large fluid collection again identified LEFT retroperitoneal 10.1 x 6.4 x 7.1 cm, could represent infected or sterile collection. Trace free pelvic fluid. Scattered stranding of subcutaneous tissue planes. Musculoskeletal: Orthopedic hardware T10-S2. LEFT hip prosthesis. Probable bone island RIGHT femoral neck. IMPRESSION: Small loculated pleural fluid collections bilaterally RIGHT greater than LEFT which contain foci of air, could either represent empyema or prior instrumentation/aspiration, slightly increased. Compressive atelectasis in the lower lobes. Loculated fluid versus less likely mass at medial RIGHT apex 2.3 x 2.1 cm; recommend attention on follow-up imaging. Large LEFT retroperitoneal fluid collection 10.1 x 6.4 x 7.1 cm little changed, could represent infected or sterile collection. Lucency surrounding the pedicle screws bilaterally at T10 favoring infection, question slightly increased on RIGHT since previous study. Aortic Atherosclerosis (ICD10-I70.0). Electronically Signed   By: Ulyses SouthwardMark  Boles M.D.   On: 01/26/2021 11:40     Medical Consultants:   None.   Subjective:    Marco Kennedy pain is controlled.  Objective:    Vitals:   01/27/21 0342 01/27/21 0400 01/27/21 0412 01/27/21 0750  BP: 134/66 (!) 120/55  128/69  Pulse: (!) 196 90  82  Resp:  20  17  Temp: 98.2 F (36.8 C)   99.4 F (37.4 C)  TempSrc: Oral   Oral  SpO2:    96%  Weight:   121.2 kg    SpO2: 96 % O2 Flow Rate (L/min): 1 L/min   Intake/Output  Summary (Last 24 hours) at 01/27/2021 0903 Last data filed at 01/27/2021 0753 Gross per 24 hour  Intake 834 ml  Output 700 ml  Net 134 ml    Filed Weights   01/24/21 0505 01/25/21 0221 01/27/21 0412  Weight: 130.4 kg 130.2 kg 121.2 kg    Exam: General exam: In no acute distress. Respiratory system: Good air movement and clear to auscultation. Cardiovascular system: S1 & S2 heard, RRR. No JVD. Gastrointestinal system: Abdomen is nondistended, soft and nontender.  Extremities: No pedal edema. Skin: No rashes, lesions or ulcers  Data Reviewed:    Labs: Basic Metabolic Panel: Recent Labs  Lab 01/20/21 2039 01/21/21 0025 01/24/21 0253 01/26/21 1432  NA 134* 134* 129* 127*  K 4.2 4.1 4.6 3.6  CL 99 98 92* 85*  CO2 GLUCOSE 162* 152* 184* 261*  BUN CREATININE 0.81 0.86 0.81 1.08  CALCIUM 7.9* 7.6* 7.6* 7.8*    GFR Estimated Creatinine Clearance: 79.3 mL/min (by C-G formula based on SCr of 1.08 mg/dL). Liver Function Tests: Recent Labs  Lab 01/20/21 2039  AST 37  ALT 13  ALKPHOS 92  BILITOT 0.5  PROT 5.1*  ALBUMIN 1.7*    No results for input(s): LIPASE, AMYLASE in the last 168 hours. No results for input(s): AMMONIA in the last 168 hours. Coagulation profile No results for input(s): INR, PROTIME in the last 168 hours. COVID-19 Labs  No results for input(s): DDIMER, FERRITIN, LDH, CRP in the last 72 hours.   Lab Results  Component Value Date   SARSCOV2NAA NEGATIVE 01/13/2021   SARSCOV2NAA NEGATIVE 05/07/2020   SARSCOV2NAA NEGATIVE 04/15/2020    CBC: Recent Labs  Lab 01/20/21 2039 01/21/21 0025 01/24/21 0253  WBC 29.0* 23.2* 16.5*  NEUTROABS 26.2*  --  13.9*  HGB 8.9* 9.7* 9.9*  HCT 28.4* 30.3* 30.2*  MCV 88.5 88.1 86.8  PLT 594* 475* 380    Cardiac Enzymes: No results for input(s): CKTOTAL, CKMB, CKMBINDEX, TROPONINI in the last 168 hours. BNP (last 3 results) No results for input(s): PROBNP in the last 8760  hours. CBG: No results for input(s): GLUCAP in the last 168 hours. D-Dimer: No results for input(s): DDIMER in the last 72 hours. Hgb A1c: No results for input(s): HGBA1C in the last 72 hours. Lipid Profile: No results for input(s): CHOL, HDL, LDLCALC, TRIG, CHOLHDL, LDLDIRECT in the last 72 hours. Thyroid function studies: No results for input(s): TSH, T4TOTAL, T3FREE, THYROIDAB in the last 72 hours.  Invalid input(s): FREET3 Anemia work up: No results for input(s): VITAMINB12, FOLATE, FERRITIN, TIBC, IRON, RETICCTPCT in the last 72 hours. Sepsis Labs: Recent Labs  Lab 01/20/21 2039 01/20/21 2050 01/21/21 0025 01/24/21 0253  PROCALCITON 1.01  --   --   --   WBC 29.0*  --  23.2* 16.5*  LATICACIDVEN  --  1.3 0.8  --     Microbiology Recent Results (from the past 240 hour(s))  MRSA Next Gen by PCR, Nasal     Status: None   Collection Time: 01/20/21  5:07 PM   Specimen: Nasal Mucosa; Nasal Swab  Result Value Ref Range Status   MRSA by PCR Next Gen NOT DETECTED NOT DETECTED Final    Comment: (NOTE) The GeneXpert MRSA Assay (FDA approved for NASAL specimens only), is one component of a comprehensive MRSA colonization surveillance program. It is not intended to diagnose MRSA infection nor to guide or monitor treatment for MRSA infections. Test performance is not FDA approved in patients less than 66 years old. Performed at [facility], [address], [place], [place] [address]   Culture, body fluid w Gram Stain-bottle     Status: None   Collection Time: 01/20/21  6:30 PM   Specimen: Fluid  Result Value Ref Range Status   Specimen Description FLUID LEFT PLEURAL  Final   Special Requests   Final    BOTTLES DRAWN AEROBIC AND ANAEROBIC Blood Culture adequate volume   Culture   Final    NO GROWTH 5 DAYS Performed at [facility], [address], [place], [place] [address]    Report Status 01/25/2021 FINAL  Final  Gram stain     Status: None   Collection  Time: 01/20/21  6:30 PM   Specimen: Fluid  Result Value Ref Range Status   Specimen Description FLUID LEFT PLEURAL  Final   Special Requests NONE  Final   Gram Stain   Final    MODERATE WBC PRESENT, PREDOMINANTLY PMN NO ORGANISMS SEEN Performed at [facility], [address], [place], [place] [address]    Report Status 01/21/2021 FINAL  Final  Culture, body fluid w Gram Stain-bottle     Status: None   Collection Time: 01/20/21  6:30 PM   Specimen: Fluid  Result Value Ref Range Status   Specimen Description FLUID RIGHT PLEURAL  Final   Special Requests   Final    BOTTLES DRAWN AEROBIC AND ANAEROBIC Blood Culture adequate volume   Culture   Final    NO GROWTH 5 DAYS Performed at [facility], [address], [place], [place] [address]    Report Status 01/25/2021 FINAL  Final  Gram stain     Status: None   Collection Time: 01/20/21  6:30 PM   Specimen: Fluid  Result Value Ref Range Status   Specimen Description FLUID RIGHT PLEURAL  Final   Special Requests NONE  Final   Gram Stain   Final    ABUNDANT WBC PRESENT, PREDOMINANTLY PMN NO ORGANISMS SEEN Performed at [facility], [address], [place], [place] [address]    Report Status 01/21/2021 FINAL  Final  Culture, blood (routine x 2)     Status: None   Collection Time: 01/20/21  8:39 PM   Specimen: BLOOD LEFT ARM  Result Value Ref Range Status   Specimen Description BLOOD LEFT ARM  Final   Special Requests   Final    BOTTLES DRAWN AEROBIC AND ANAEROBIC Blood Culture adequate volume   Culture   Final    NO GROWTH 5 DAYS Performed at [facility], [address] [place], [place] [address]    Report Status 01/25/2021 FINAL  Final  Culture, blood (routine x 2)     Status: None   Collection Time: 01/20/21  8:39 PM   Specimen: BLOOD LEFT HAND  Result Value Ref Range Status   Specimen Description BLOOD LEFT HAND  Final   Special Requests   Final    BOTTLES DRAWN AEROBIC AND ANAEROBIC Blood  Culture adequate volume   Culture  Final    NO GROWTH 5 DAYS Performed at [facility], [address] [place], [place] [address]    Report Status 01/25/2021 FINAL  Final  Urine Culture     Status: None   Collection Time: 01/21/21  7:31 AM   Specimen: Urine, Clean Catch  Result Value Ref Range Status   Specimen Description URINE, CLEAN CATCH  Final   Special Requests NONE  Final   Culture   Final    NO GROWTH Performed at [facility], [address], [place], [place] [address]    Report Status 01/22/2021 FINAL  Final  Culture, blood (routine x 2)     Status: None (Preliminary result)   Collection Time: 01/23/21  9:52 PM   Specimen: BLOOD  Result Value Ref Range Status   Specimen Description BLOOD LEFT ANTECUBITAL  Final   Special Requests   Final    BOTTLES DRAWN AEROBIC AND ANAEROBIC Blood Culture adequate volume   Culture   Final    NO GROWTH 3 DAYS Performed at [facility], [address] [place], [place] [address]    Report Status PENDING  Incomplete  Culture, blood (routine x 2)     Status: None (Preliminary result)   Collection Time: 01/23/21  9:52 PM   Specimen: BLOOD  Result Value Ref Range Status   Specimen Description BLOOD LEFT ANTECUBITAL  Final   Special Requests   Final    BOTTLES DRAWN AEROBIC AND ANAEROBIC Blood Culture adequate volume   Culture   Final    NO GROWTH 3 DAYS Performed at [facility], [address], [place], [place] [address]    Report Status PENDING  Incomplete     Medications:    Chlorhexidine Gluconate Cloth  6 each Topical Daily   docusate sodium  100 mg Oral BID   [START ON 01/28/2021] enoxaparin (LOVENOX) injection  40 mg Subcutaneous Daily   feeding supplement  237 mL Oral TID BM   multivitamin with minerals  1 tablet Oral Daily   sodium chloride flush  10-40 mL Intracatheter Q12H   sodium chloride flush  3 mL Intravenous Q12H   Continuous Infusions:   ceFAZolin (ANCEF) IV 2 g (01/27/21 0318)       LOS: 7 days   Marinda Elk  [facility]  01/27/2021, 9:03 AM

## 2021-01-27 NOTE — Progress Notes (Signed)
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  IR has been consulted for drainage of large retroperitoneal fluid collection in the left side.   Assessment Fevers Blood cx 7/29 NG in 2 days Has a PICC line - no redness/tenderness/erythema No diarrhea  No peripheral joint/limb swelling   Large left retroperitoneal fluid collection  Bilateral pleural effusion, exudative ( Possible empyema).  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 coli  Recommendations Continue cefazolin Greatly appreciate CT surgery and pulmonary recommendations - no plans for surgical intervention for pleural effusion/empyema for now Discussed with IR for drainage of large left-sided retroperitoneal fluid collection  Repeat MRI TL spine around 3-4 weeks as per neurosurgery note on 01/21/21 Monitor CBC and BMP Continue PT/OT and  mobilisation  Following  Rest of the management as per the primary team. Thank you for the consult. Please page with pertinent questions or concerns.  ______________________________________________________________________ Subjective patient seen and examined at the bedside.  Wife is at bedside.  Discussed CT chest abdomen pelvis findings with patient and wife and informed about IR consult for need to drain the left retroperitoneal fluid collection.  Patient feels the same as yesterday.   Vitals BP 128/69 (BP Location: Left Arm)   Pulse 82   Temp 99.4 F (37.4 C) (Oral)   Resp 17   Wt 121.2 kg   SpO2 96%   BMI 41.85 kg/m     Physical Exam Constitutional: Lying in the bed and appears comfortable    Comments:   Cardiovascular:     Rate and Rhythm: Normal rate and regular rhythm.     Heart sounds:  Pulmonary:     Effort: on room air     Comments:   Abdominal:     Palpations: Abdomen is soft.     Tenderness: Non tender and non distended   Musculoskeletal:        General: No swelling or tenderness in peripheral joints, 1+ pitting pedal edema   Skin:    Comments: No lesions or rashes   Neurological:     General: No focal deficit present. Back - non tender  Psychiatric:        Mood and Affect: Mood normal.   Pertinent Microbiology Results for orders placed or performed during the hospital encounter of 01/20/21  MRSA Next Gen by PCR, Nasal     Status: None   Collection Time: 01/20/21  5:07 PM   Specimen: Nasal Mucosa; Nasal Swab  Result Value Ref Range Status   MRSA by PCR Next Gen NOT  DETECTED NOT DETECTED Final    Comment: (NOTE) The GeneXpert MRSA Assay (FDA approved for NASAL specimens only), is one component of a comprehensive MRSA colonization surveillance program. It is not intended to diagnose MRSA infection nor to guide or monitor treatment for MRSA infections. Test performance is not FDA approved in patients less than 70 years old. Performed at [facility], [address] [place], [place] [address]   Culture, body fluid w Gram Stain-bottle     Status: None   Collection Time: 01/20/21  6:30 PM   Specimen: Fluid  Result Value Ref Range Status   Specimen Description FLUID LEFT PLEURAL  Final   Special Requests   Final    BOTTLES DRAWN AEROBIC AND ANAEROBIC Blood Culture adequate volume   Culture   Final    NO GROWTH 5 DAYS Performed at [facility], [address] [place], [place] [address]    Report Status 01/25/2021 FINAL  Final  Gram stain     Status: None   Collection Time: 01/20/21  6:30 PM   Specimen: Fluid  Result Value Ref Range Status   Specimen Description FLUID LEFT PLEURAL  Final   Special Requests NONE  Final   Gram Stain   Final    MODERATE WBC PRESENT, PREDOMINANTLY PMN NO ORGANISMS SEEN Performed at [facility], [address] [place], [place] [address]    Report Status 01/21/2021 FINAL  Final  Culture, body fluid w Gram Stain-bottle     Status: None   Collection Time: 01/20/21  6:30 PM   Specimen: Fluid  Result Value Ref Range Status   Specimen Description FLUID RIGHT PLEURAL  Final   Special Requests   Final    BOTTLES DRAWN AEROBIC AND ANAEROBIC Blood Culture adequate volume   Culture   Final    NO GROWTH 5 DAYS Performed at [facility], [address] [place], [place] [address]    Report Status 01/25/2021 FINAL  Final  Gram stain     Status: None   Collection Time: 01/20/21  6:30 PM   Specimen: Fluid  Result Value Ref Range Status   Specimen Description FLUID RIGHT PLEURAL  Final   Special Requests NONE  Final   Gram Stain   Final    ABUNDANT WBC PRESENT, PREDOMINANTLY PMN NO ORGANISMS SEEN Performed at [facility], [address] [place], [place] [address]    Report Status 01/21/2021 FINAL  Final  Culture, blood (routine x 2)     Status: None   Collection Time: 01/20/21  8:39 PM   Specimen: BLOOD LEFT ARM  Result Value Ref Range Status   Specimen  Description BLOOD LEFT ARM  Final   Special Requests   Final    BOTTLES DRAWN AEROBIC AND ANAEROBIC Blood Culture adequate volume   Culture   Final    NO GROWTH 5 DAYS Performed at [facility], [address] [place], [place] [address]    Report Status 01/25/2021 FINAL  Final  Culture, blood (routine x 2)     Status: None   Collection Time: 01/20/21  8:39 PM   Specimen: BLOOD LEFT HAND  Result Value Ref Range Status   Specimen Description BLOOD LEFT HAND  Final   Special Requests   Final    BOTTLES DRAWN AEROBIC AND ANAEROBIC Blood Culture adequate volume   Culture   Final    NO GROWTH 5 DAYS Performed at [facility], [address], [place], [place] [address]    Report Status 01/25/2021 FINAL  Final  Urine Culture     Status: None   Collection Time: 01/21/21  7:31 AM   Specimen: Urine, Clean Catch  Result Value Ref Range Status   Specimen Description URINE, CLEAN CATCH  Final   Special Requests NONE  Final   Culture   Final    NO GROWTH Performed at [facility], [address], [place], [place] [address]    Report Status 01/22/2021 FINAL  Final  Culture, blood (routine x 2)     Status: None (Preliminary result)   Collection Time: 01/23/21  9:52 PM   Specimen: BLOOD  Result Value Ref Range Status   Specimen Description BLOOD LEFT ANTECUBITAL  Final   Special Requests   Final    BOTTLES DRAWN AEROBIC AND ANAEROBIC Blood Culture adequate volume   Culture   Final    NO GROWTH 4 DAYS Performed at [facility], [address] [place], [place] [address]    Report Status PENDING  Incomplete  Culture, blood (routine x 2)     Status: None (Preliminary result)   Collection Time: 01/23/21  9:52 PM   Specimen: BLOOD  Result Value Ref Range Status   Specimen Description BLOOD LEFT ANTECUBITAL  Final   Special Requests   Final    BOTTLES DRAWN AEROBIC AND ANAEROBIC Blood Culture adequate volume   Culture   Final    NO GROWTH 4 DAYS Performed at [facility], [address] [place], [place] [address]    Report Status PENDING  Incomplete  Culture, blood (routine x 2)     Status: None (Preliminary result)   Collection Time: 01/26/21  7:52 AM   Specimen: BLOOD  Result Value Ref Range Status   Specimen Description BLOOD RIGHT ANTECUBITAL  Final   Special Requests   Final    BOTTLES DRAWN AEROBIC AND ANAEROBIC Blood Culture adequate volume   Culture   Final    NO GROWTH < 24 HOURS Performed at [facility], [address], [place], [place] [address]    Report Status PENDING  Incomplete  Culture, blood (routine x 2)     Status: None (Preliminary result)   Collection Time: 01/26/21  7:53 AM   Specimen: BLOOD  Result Value Ref Range Status   Specimen Description BLOOD RIGHT ANTECUBITAL  Final   Special Requests   Final    BOTTLES DRAWN AEROBIC AND ANAEROBIC Blood Culture adequate volume   Culture   Final    NO GROWTH < 24 HOURS Performed at [facility], [address] [place], [place] [address]    Report Status PENDING  Incomplete     Pertinent Lab. CBC Latest Ref Rng & Units 01/24/2021 01/21/2021 01/20/2021  WBC 4.0 - 10.5 K/uL 16.5(H) 23.2(H) 29.0(H)  Hemoglobin 13.0 - 17.0 g/dL 1.3(K) 4.4(W) 8.9(L)  Hematocrit 39.0 - 52.0 % 30.2(L) 30.3(L) 28.4(L)  Platelets 150 - 400 K/uL 380 475(H) 594(H)   CMP Latest Ref Rng & Units 01/26/2021 01/24/2021 01/21/2021  Glucose 70 - 99 mg/dL 102(V) 253(G) 644(I)  BUN 8 - 23 mg/dL 18 19 18   Creatinine 0.61 - 1.24 mg/dL 3.47 4.25  Sodium 135 - 145 mmol/L 127(L) 129(L) 134(L)  Potassium 3.5 - 5.1 mmol/L 3.6 4.6 4.1  Chloride 98 - 111 mmol/L 85(L) 92(L) 98  CO2 22 - 32 mmol/L 30 29  29  Calcium 8.9 - 10.3 mg/dL 7.8(L) 7.6(L) 7.6(L)  Total Protein 6.5 - 8.1 g/dL - - -  Total Bilirubin 0.3 - 1.2 mg/dL - - -  Alkaline Phos 38 - 126 U/L - - -  AST 15 - 41 U/L - - -  ALT 0 - 44 U/L - - -     Pertinent Imaging today Plain films and CT images have been personally visualized  and interpreted; radiology reports have been reviewed. Decision making incorporated into the Impression / Recommendations.  TTE 01/21/21 1. Left ventricular ejection fraction, by estimation, is 65 to 70%. The left ventricle has normal function. The left ventricle has no regional wall motion abnormalities. There is mild left ventricular hypertrophy. Left ventricular diastolic parameters were normal. 2. Right ventricular systolic function is normal. The right ventricular size is normal. Tricuspid regurgitation signal is inadequate for assessing PA pressure. 3. The mitral valve is normal in structure. No evidence of mitral valve regurgitation. 4. The aortic valve was not well visualized. Aortic valve regurgitation is not visualized. No aortic stenosis is present. 5. The inferior vena cava is normal in size with greater than 50% respiratory variability, suggesting right atrial pressure of 3 mmHg. Conclusion(s)/Recommendation(s): No vegetation seen. If high clinical suspicion for endocarditis, consider TEE.  CT chest abdomen pelvis 01/26/21 IMPRESSION: Small loculated pleural fluid collections bilaterally RIGHT greater than LEFT which contain foci of air, could either represent empyema or prior instrumentation/aspiration, slightly increased.   Compressive atelectasis in the lower lobes.   Loculated fluid versus less likely mass at medial RIGHT apex 2.3 x 2.1 cm; recommend attention on follow-up imaging.   Large LEFT retroperitoneal fluid collection 10.1 x 6.4 x 7.1 cm little changed, could represent infected or sterile collection.   Lucency surrounding the pedicle screws bilaterally at T10 favoring infection, question slightly increased on RIGHT since previous study.   Aortic Atherosclerosis (ICD10-I70.0).  I spent more than 35 minutes for this patient encounter including review of prior medical records, coordination of care  with greater than 50% of time being face to face/counseling and  discussing diagnostics/treatment plan with the patient/family.  Electronically signed by:   Odette Fraction, MD Infectious Disease Physician [facility] Pager: [phone]

## 2021-01-27 NOTE — Progress Notes (Signed)
OT Cancellation Note  Patient Details Name: Marco Kennedy MRN: [id number] DOB: 05-10-1951   Cancelled Treatment:    Reason Eval/Treat Not Completed: Patient at procedure or test/ unavailable Pt being taken to IR. Will return as schedule allows.  Ladene Artist, OTDS   Ladene Artist 01/27/2021, 2:39 PM

## 2021-01-27 NOTE — Procedures (Signed)
Vascular and Interventional Radiology Procedure Note  Patient: Marco Kennedy DOB: Nov 08, 1950 Medical Record Number: [id number] Note Date/Time: 01/27/21 2:03 PM   Performing Physician: Roanna Banning, MD Assistant(s): None  Diagnosis: Left Retroperitoneal Collection  Procedure: DRAINAGE CATHETER PLACEMENT OF LEFT RETROPERITONEAL COLLECTION  Anesthesia: Conscious Sedation Complications: None Estimated Blood Loss:  <5 mL Specimens: Sent for Gram Stain, Aerobe Culture, Anerobe Culture, and Body Fluid Creatinine  Findings:  Successful CT-guided placement of 12 F catheter into Left Retroperitoneal Collection.  Plan:  - Record drain output every 12 hours. - Follow up drain evaluation / sinogram in 2 week(s).  See detailed procedure note with images in PACS. The patient tolerated the procedure well without incident or complication and was returned to Floor Bed in stable condition.    Roanna Banning, MD Vascular and Interventional Radiology Specialists [facility] Radiology   Pager. [phone] Clinic. [phone]

## 2021-01-27 NOTE — Progress Notes (Signed)
Inpatient Rehab Admissions Coordinator:   I did receive insurance authorization; however repeat CT showed empyema with loculation as well as fluid collections in the psoas and lucency surrounding hardware at T10.  Per Dr. David Stall, not currently medically ready for CIR.  I will continue to follow and update insurance.    Estill Dooms, PT, DPT Admissions Coordinator [phone] 01/27/21  11:35 AM

## 2021-01-27 NOTE — Progress Notes (Addendum)
   NAME:  Marco Kennedy, MRN:  [id number], DOB:  [birth year], LOS: 7 ADMISSION DATE:  01/20/2021, CONSULTATION DATE:  01/20/21 REFERRING MD:  Ophelia Charter, CHIEF COMPLAINT:  SOB   History of Present Illness:  70 year old man with hx of OSA on CPAP, recent lumbar hardware infection, epidural abscess who was Dc'd on prolonged IV antibiotics on 7/16 presenting with progressive SOB, chest pain, fevers, chills, and shoulder discomfort.  Imaging at [facility] ED revealed continued infection in thoracolumbar areas as well as new likely spread to pleura.  PCCM consulted to help manage pleural infection.  Pertinent  Medical History  OSA Pelvic hardware infection detailed in DC summary 7/22  Significant Hospital Events:  7/26 admitted from [facility] ED 7/27 changed to water seal. ID seeing, NSGY seeing 7/28 improved pleuritic pain, continues on water seal.  Still awaiting pleural cx. Left chest tube removed  7/29 200cc in 24 hours in the right tube 7/30 70cc in the last 24 hours in the right tube, chest tube fulled 8/1 CT Chest/A/Pelvis showed improved pleural effusions, 10cm RP collection  Interim History / Subjective:  On room air. Wife at bedside. No fevers in the last 48 hours. WBC downtrending.  Feeling hungry.  Objective    Vitals:   01/27/21 0400 01/27/21 0750  BP: (!) 120/55 128/69  Pulse: 90 82  Resp: 20 17  Temp:  99.4 F (37.4 C)  SpO2:  96%    General: Pleasant elderly male lying in bed in NAD HEENT: mmm, large neck Neuro: Alert and oriented x3, non-focal  CV: RRR no mrg PULM: diminished bilaterally GI: obese, soft Extremities: mild dependent edema  CT C/A/P personally reviewed Small bilateral effusions remain in the pleural space with gas secondary to instrumentation. There is mild compressive atelectasis. There is a large RP fluid collection of 10cm.   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  To IR today for RP fluid collection drainage.  I think his persistent effusions are small and unlikely to be of significant infection source. Will discuss with Dr. Cliffton Asters - hopefully he will not need further surgery or intervention for this as he has been improving from infectious standpoint and is weaned to room air.  He is hoping to get into CIR this time around - he is significantly deconditioned and motivated to have therapy. Needs to get out of bed and up in a chair as much as possible for his pulmonary status as well.  Continue CPAP.   Signature:   Durel Salts, MD Pulmonary and Critical Care Medicine [facility]

## 2021-01-28 ENCOUNTER — Inpatient Hospital Stay (HOSPITAL_COMMUNITY): Payer: BC Managed Care – PPO

## 2021-01-28 DIAGNOSIS — M8600 Acute hematogenous osteomyelitis, unspecified site: Secondary | ICD-10-CM | POA: Diagnosis not present

## 2021-01-28 DIAGNOSIS — Z9989 Dependence on other enabling machines and devices: Secondary | ICD-10-CM

## 2021-01-28 DIAGNOSIS — G4733 Obstructive sleep apnea (adult) (pediatric): Secondary | ICD-10-CM | POA: Diagnosis not present

## 2021-01-28 DIAGNOSIS — A419 Sepsis, unspecified organism: Secondary | ICD-10-CM | POA: Diagnosis not present

## 2021-01-28 DIAGNOSIS — R159 Full incontinence of feces: Secondary | ICD-10-CM

## 2021-01-28 DIAGNOSIS — J90 Pleural effusion, not elsewhere classified: Secondary | ICD-10-CM | POA: Diagnosis not present

## 2021-01-28 LAB — BASIC METABOLIC PANEL
Anion gap: 10 (ref 5–15)
BUN: 22 mg/dL (ref 8–23)
CO2: 34 mmol/L — ABNORMAL HIGH (ref 22–32)
Calcium: 7.6 mg/dL — ABNORMAL LOW (ref 8.9–10.3)
Chloride: 86 mmol/L — ABNORMAL LOW (ref 98–111)
Creatinine, Ser: 1.01 mg/dL (ref 0.61–1.24)
GFR, Estimated: >60 mL/min (ref >60)
Glucose, Bld: 195 mg/dL — ABNORMAL HIGH (ref 70–99)
Potassium: 3.7 mmol/L (ref 3.5–5.1)
Sodium: 130 mmol/L — ABNORMAL LOW (ref 135–145)

## 2021-01-28 LAB — CBC
HCT: 28.7 % — ABNORMAL LOW (ref 39.0–52.0)
Hemoglobin: 8.9 g/dL — ABNORMAL LOW (ref 13.0–17.0)
MCH: 27.4 pg (ref 26.0–34.0)
MCHC: 31 g/dL (ref 30.0–36.0)
MCV: 88.3 fL (ref 80.0–100.0)
Platelets: 362 K/uL (ref 150–400)
RBC: 3.25 MIL/uL — ABNORMAL LOW (ref 4.22–5.81)
RDW: 13.4 % (ref 11.5–15.5)
WBC: 17.5 K/uL — ABNORMAL HIGH (ref 4.0–10.5)
nRBC: 0 % (ref 0.0–0.2)

## 2021-01-28 LAB — "CREATININE, BODY FLUID OTHER       ": Creatinine, Body Fluid: 0.8 mg/dL

## 2021-01-28 LAB — CULTURE, BLOOD (ROUTINE X 2)
Culture: NO GROWTH
Culture: NO GROWTH
Special Requests: ADEQUATE
Special Requests: ADEQUATE

## 2021-01-28 IMAGING — MR MR LUMBAR SPINE WO/W CM
7 of 8 series · 35 of 48 positions shown · IV contrast (gadavist)
Comparison: [DATE]

CLINICAL DATA: Discitis-osteomyelitis

EXAM:
MRI THORACIC AND LUMBAR SPINE WITHOUT AND WITH CONTRAST
TECHNIQUE: Multiplanar and multiecho pulse sequences of the thoracic and lumbar
spine were obtained without and with intravenous contrast.
CONTRAST:  10mL GADAVIST GADOBUTROL 1 MMOL/ML IV SOLN

[Series 2: T2 · sagittal · 4.0mm · 0.73mm/px · 4 of 15 slices shown]
[im 1/15]
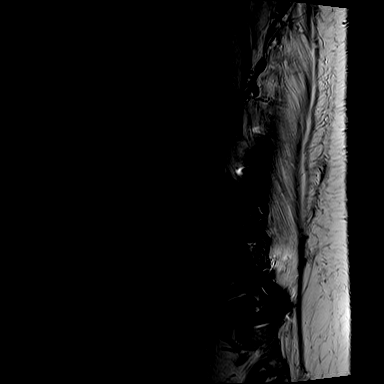
[im 5/15]
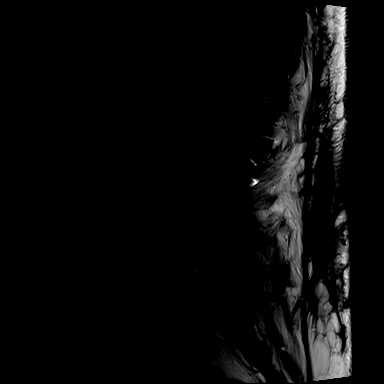
[im 10/15]
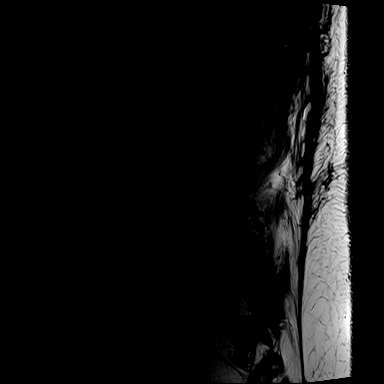
[im 15/15]
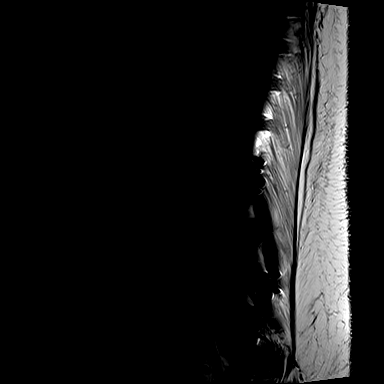

[Series 3: STIR · sagittal · 4.0mm · 0.51mm/px · 5 of 15 slices shown]
[im 1/15]
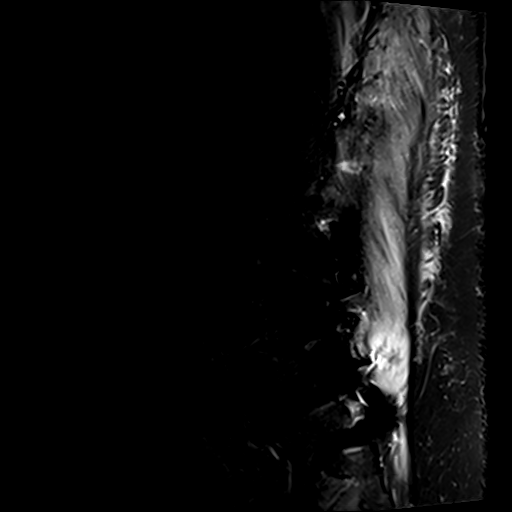
[im 4/15]
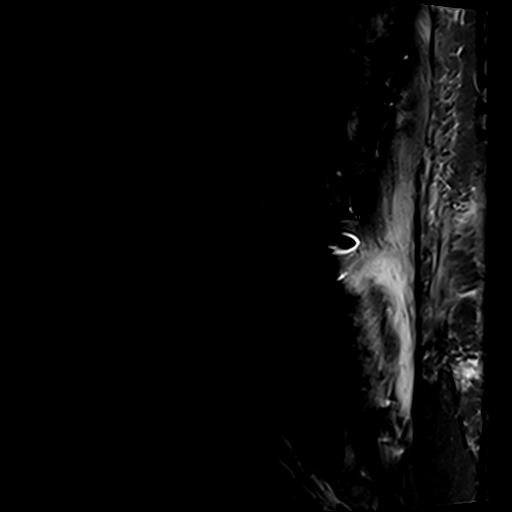
[im 8/15]
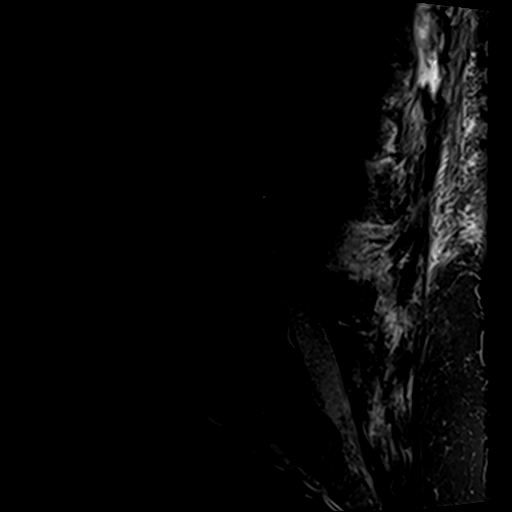
[im 11/15]
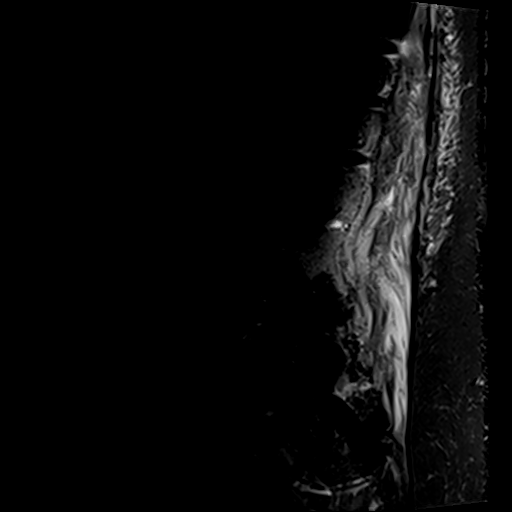
[im 15/15]
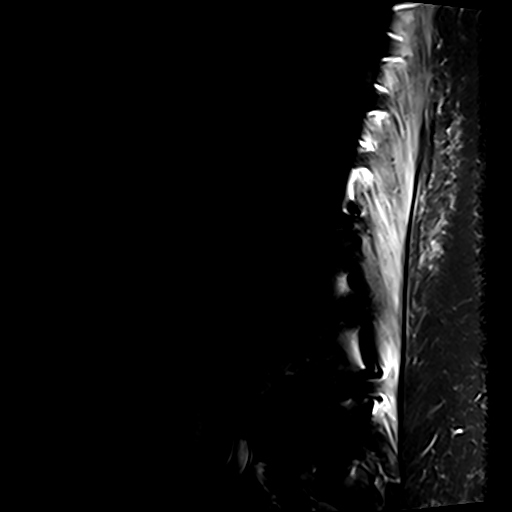

[Series 4: T1 · sagittal · 4.0mm · 0.92mm/px · 5 of 15 slices shown]
[im 1/15]
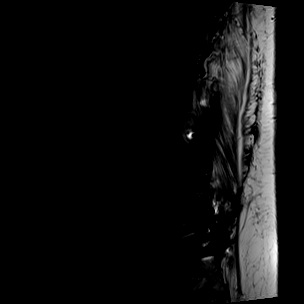
[im 4/15]
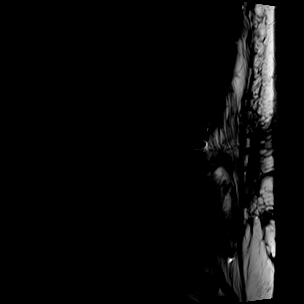
[im 8/15]
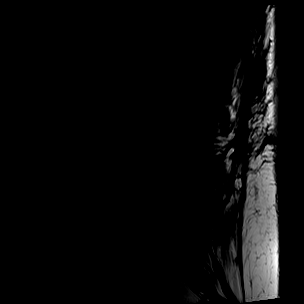
[im 11/15]
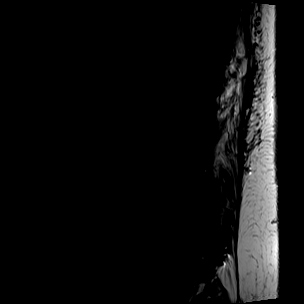
[im 15/15]
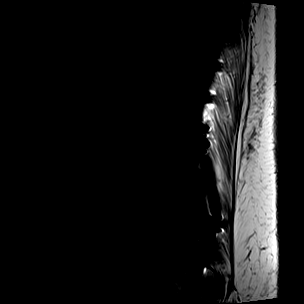

[Series 5: t2_tse_stir_warp_sag · sagittal · 4.0mm · 1.02mm/px · 5 of 15 slices shown]
[im 1/15]
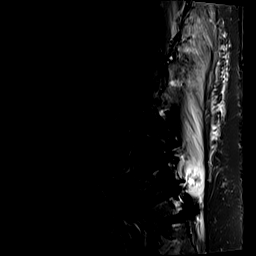
[im 4/15]
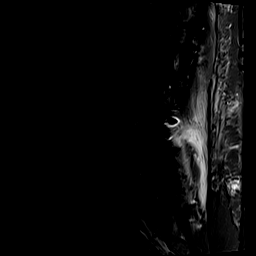
[im 8/15]
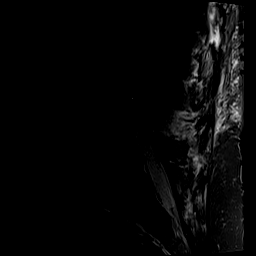
[im 11/15]
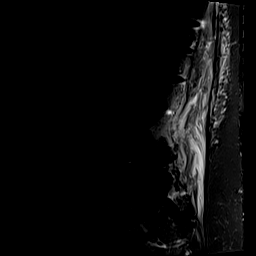
[im 15/15]
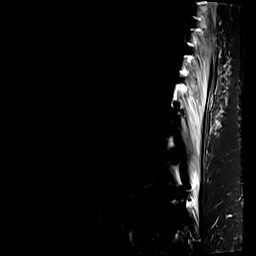

[Series 6: t2_tse_warp_tra · axial · 5.0mm · 0.78mm/px · z∈[-473,-272]mm · 8 of 28 slices shown]
[im 1/28]
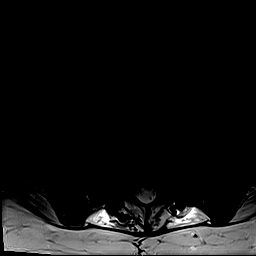
[im 4/28]
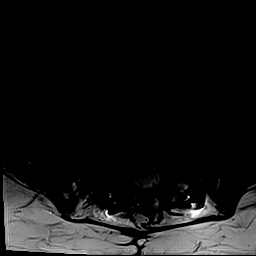
[im 8/28]
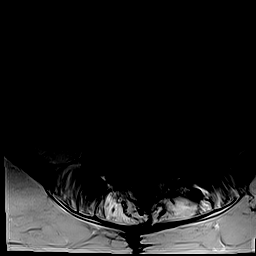
[im 12/28]
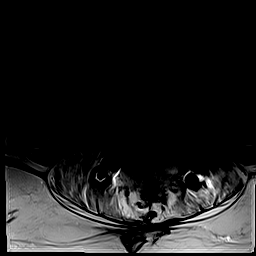
[im 16/28]
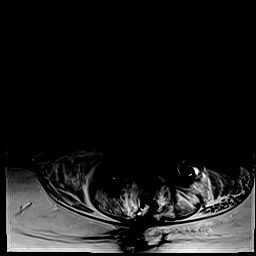
[im 20/28]
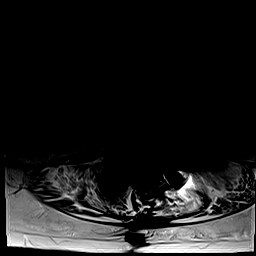
[im 24/28]
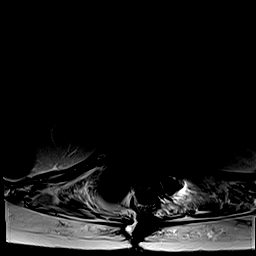
[im 28/28]
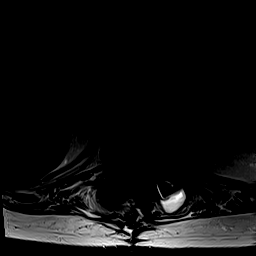

[Series 7: t1_tse_warp_tra · axial · 5.0mm · 0.39mm/px · z∈[-473,-421]mm · 3 of 28 slices shown]
[im 1/28]
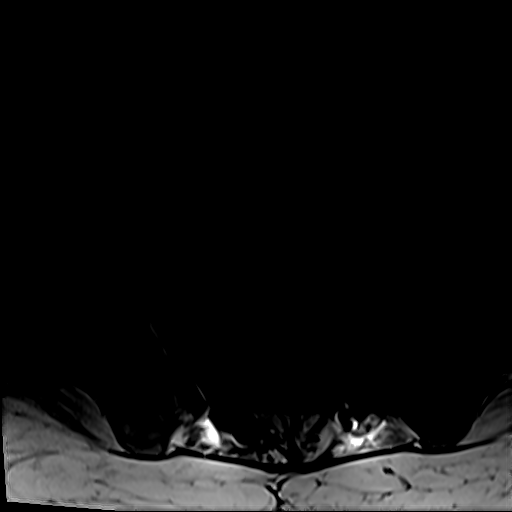
[im 4/28]
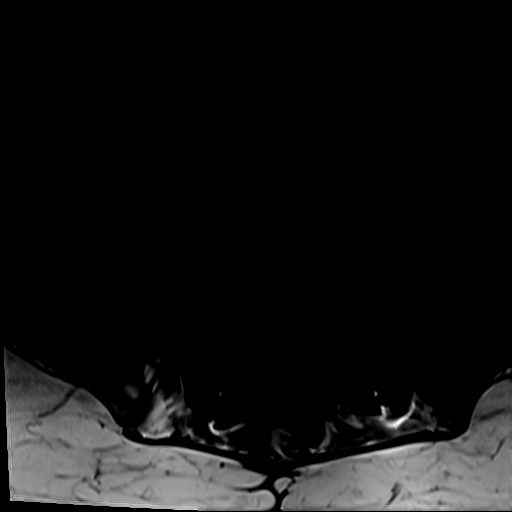
[im 8/28]
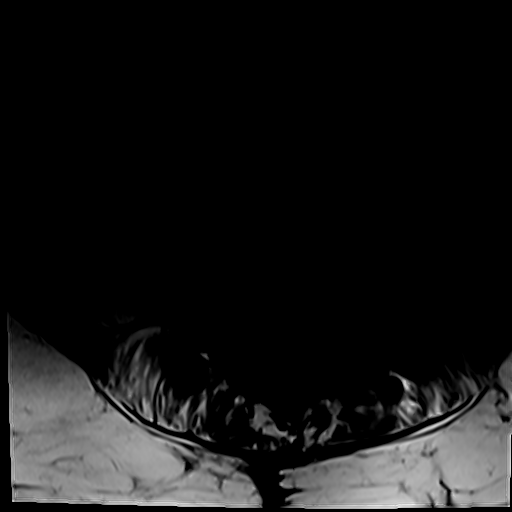

[Series 8: T1 fat-sat post-contrast · sagittal · 4.0mm · 0.92mm/px · 5 of 15 slices shown]
[im 1/15]
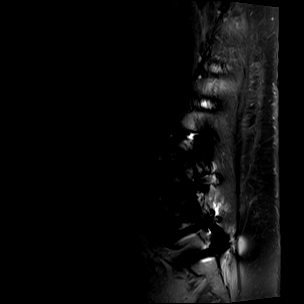
[im 4/15]
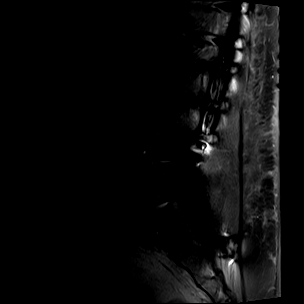
[im 8/15]
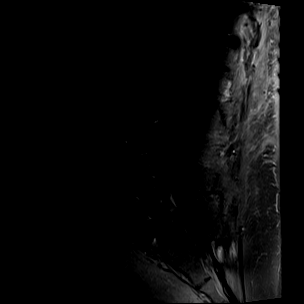
[im 11/15]
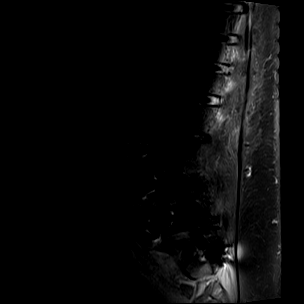
[im 15/15]
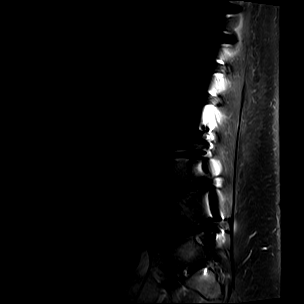

[35 of 48 positions shown; findings below may reference images not displayed]

FINDINGS: MRI THORACIC SPINE FINDINGS

Alignment:  Physiologic.

Vertebrae: Spinal fusion hardware begins at T10. More superior
levels are normal.

Cord: Normal signal and morphology. There is a ventral epidural
collection that begins at the level of the T7-8 disc space and
extends inferiorly to T10-11, unchanged in craniocaudal extent. AP
thickness measures approximately 5 mm.

Paraspinal and other soft tissues: Loculated bilateral pleural
collections. Small amount of fluid adjacent to the left T12 spinal
rod. The fluid at the right spinal rod has substantially resolved.

Disc levels:

There is abnormal disc signal at T9-10 and T10-11. The T10-11
finding is unchanged but the T9-10 abnormality is new.

Circumferential dural thickening and enhancement at the T10 level
effaces the thecal sac. This is poorly visualized on the prior study
on multiple sequences, but appears to be unchanged based on the
axial T2-weighted imaging.

MRI LUMBAR SPINE FINDINGS

Segmentation:  Standard

Alignment:  Normal

Vertebrae: Posterior fusion hardware extends inferiorly to the
sacrum. There are interbody spacers at L4-5 and L5-S1.

Conus medullaris: Extends to the L1-2 level and appears normal.

Paraspinal and other soft tissues: Negative.

Disc levels:

Degenerative findings are unchanged since [DATE]

L1-2: Normal disc.  No stenosis.

L2-3: Disc desiccation without spinal canal stenosis.

L3-4: Posterior decompression no spinal canal stenosis.

L4-5: Posterior decompression.  Mild left foraminal stenosis.

L5-S1: No disc herniation.  Mild left foraminal stenosis.
IMPRESSION: 1. Unchanged appearance of ventral epidural abscess extending from
T7-T11 with likely source at the T10-11 disc space. Circumferential
dural thickening at the T10 level again effaces the thecal sac with
mass effect on the spinal cord.
2. New abnormal disc signal at T9-10 and unchanged abnormal signal
T10-11, consistent with discitis-osteomyelitis.
3. No lumbar spinal canal stenosis.
4. Loculated bilateral pleural collections.

## 2021-01-28 IMAGING — MR MR THORACIC SPINE WO/W CM
5 of 9 series · 22 of 48 positions shown · IV contrast (Gadavist)
Comparison: [DATE]

CLINICAL DATA: Discitis-osteomyelitis

EXAM:
MRI THORACIC AND LUMBAR SPINE WITHOUT AND WITH CONTRAST
TECHNIQUE: Multiplanar and multiecho pulse sequences of the thoracic and lumbar
spine were obtained without and with intravenous contrast.
CONTRAST:  10mL GADAVIST GADOBUTROL 1 MMOL/ML IV SOLN

[Series 18: T1 · sagittal · 3.3mm · 0.62mm/px · 1 of 11 slices shown (1 of 3)]
[im 1/11]
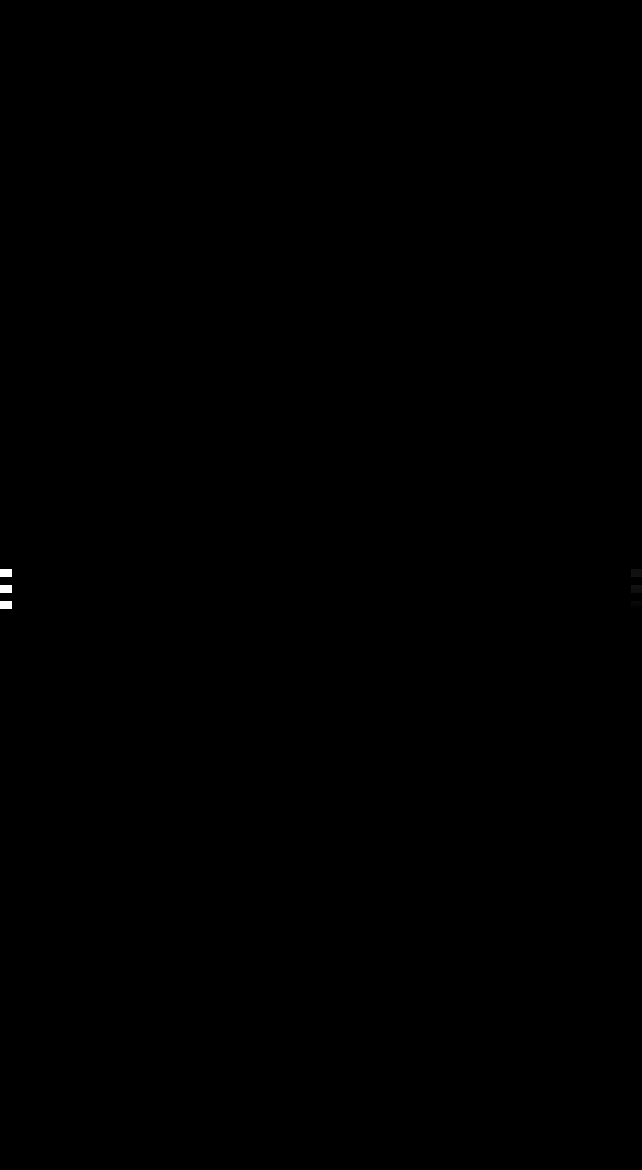

[Series 19: T2 · sagittal · 3.0mm · 0.82mm/px · 3 of 17 slices shown (1 of 2)]
[im 1/17]
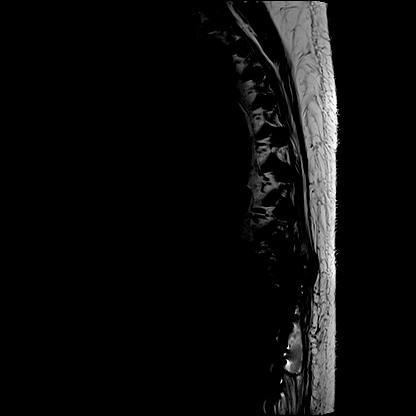
[im 9/17]
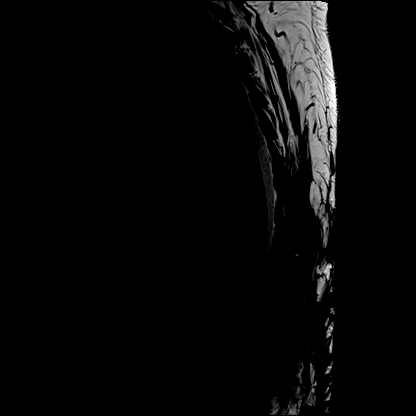
[im 17/17]
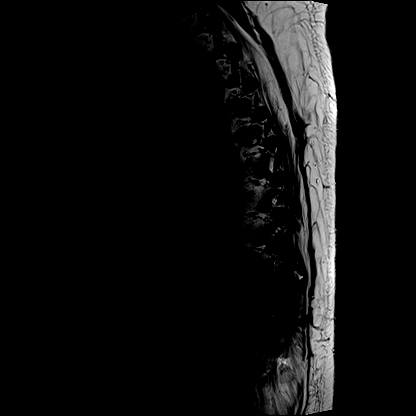

[Series 20: T1 · sagittal · 3.0mm · 0.82mm/px · 4 of 17 slices shown (2 of 3)]
[im 1/17]
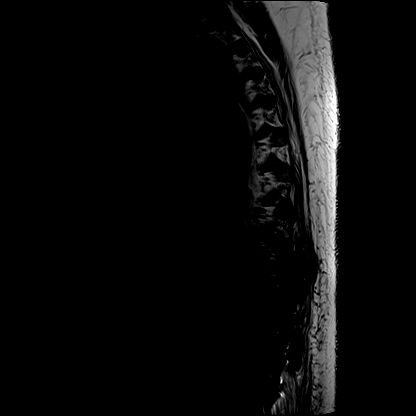
[im 6/17]
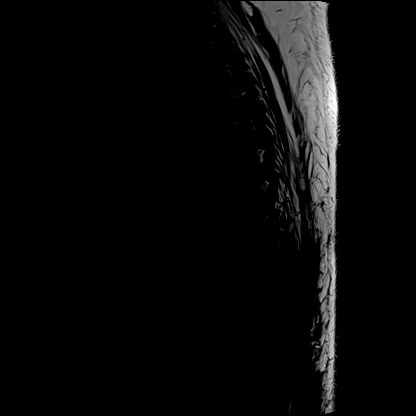
[im 11/17]
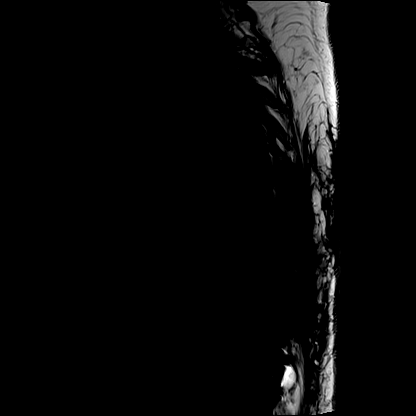
[im 17/17]
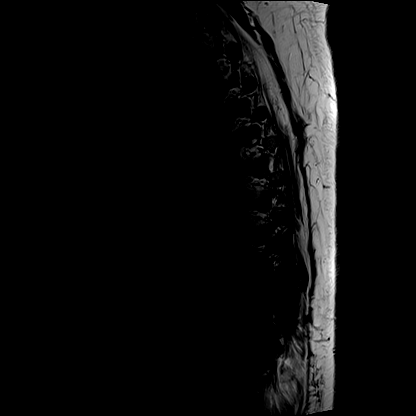

[Series 22: T2 · axial · 5.0mm · 0.62mm/px · z∈[-287,-59]mm · 8 of 36 slices shown (2 of 2)]
[im 1/36]
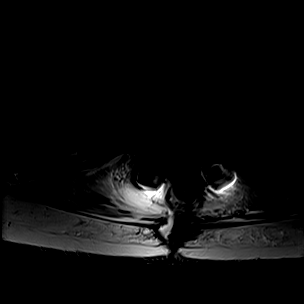
[im 6/36]
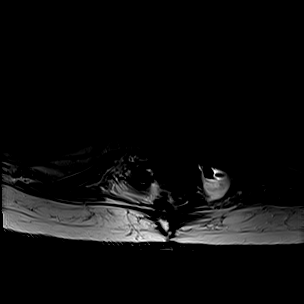
[im 11/36]
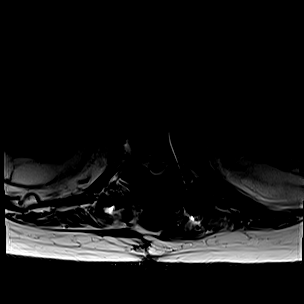
[im 16/36]
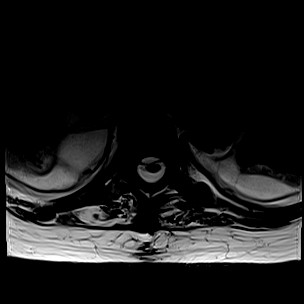
[im 21/36]
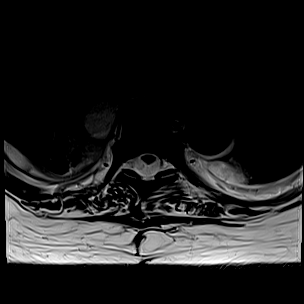
[im 26/36]
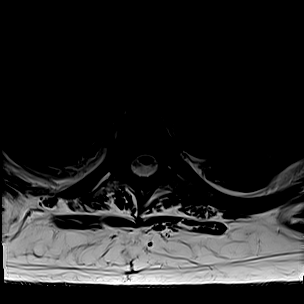
[im 31/36]
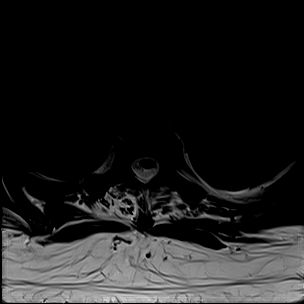
[im 36/36]
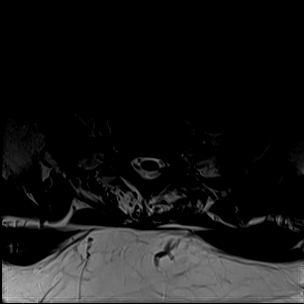

[Series 24: T1 · axial · non-contrast · 5.0mm · 0.31mm/px · z∈[-287,-118]mm · 6 of 36 slices shown (3 of 3)]
[im 1/36]
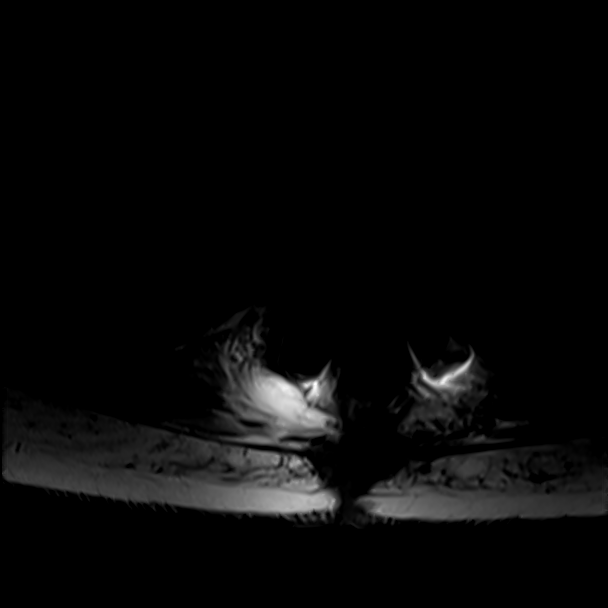
[im 6/36]
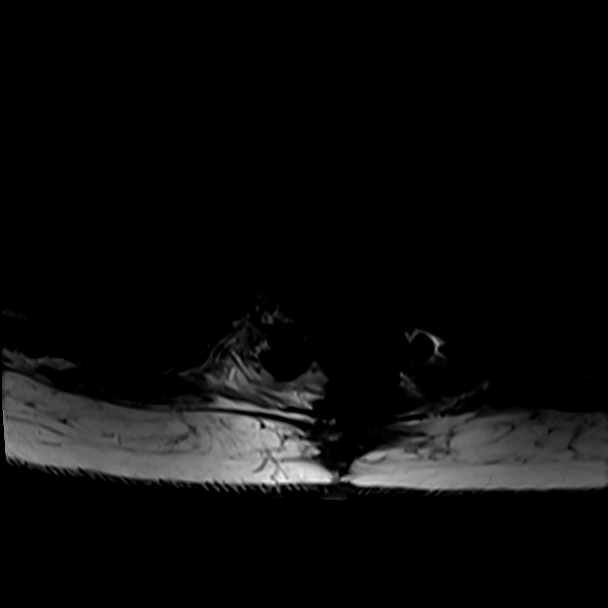
[im 11/36]
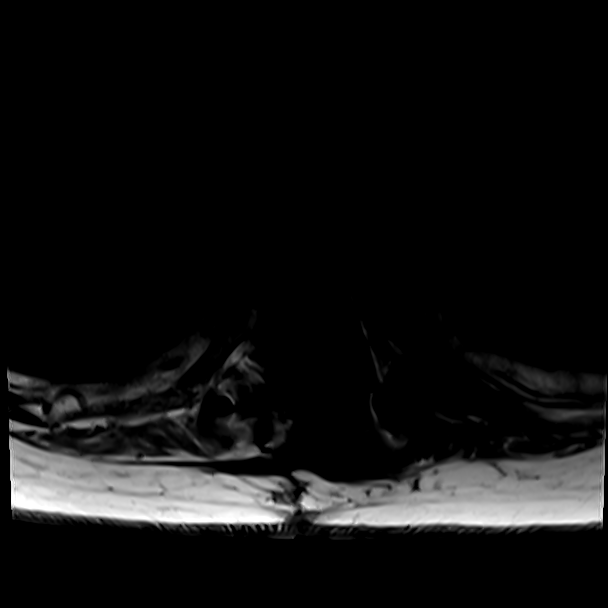
[im 16/36]
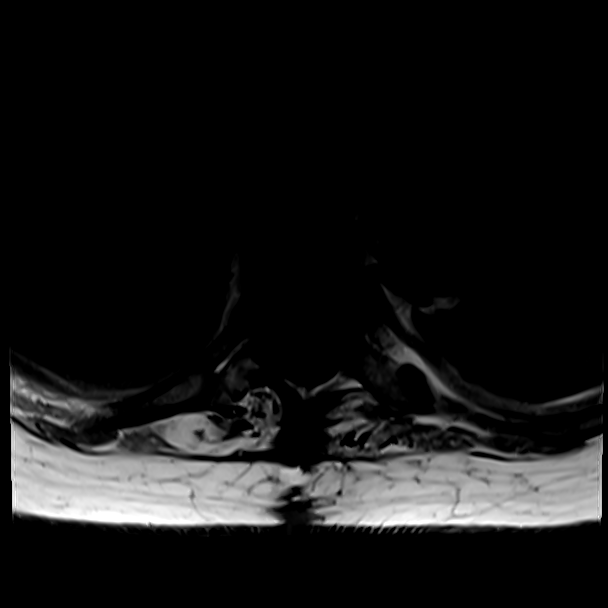
[im 21/36]
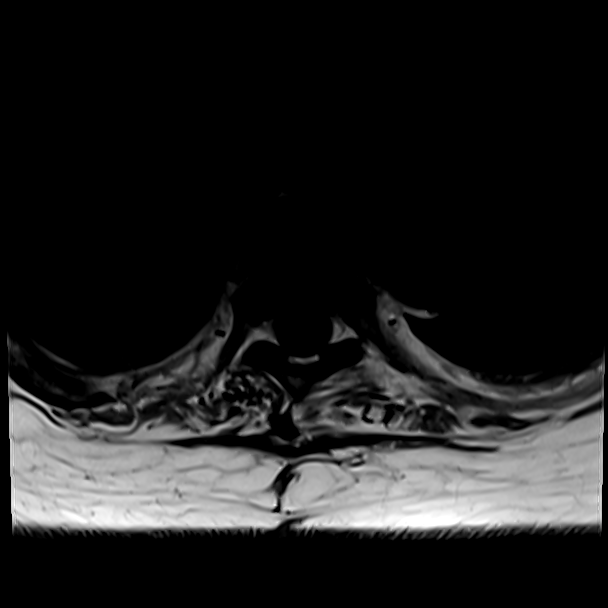
[im 26/36]
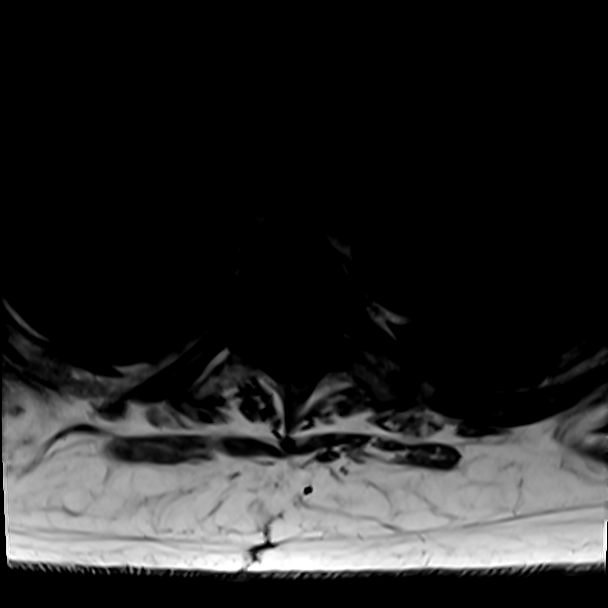

[22 of 48 positions shown; findings below may reference images not displayed]

FINDINGS: MRI THORACIC SPINE FINDINGS

Alignment:  Physiologic.

Vertebrae: Spinal fusion hardware begins at T10. More superior
levels are normal.

Cord: Normal signal and morphology. There is a ventral epidural
collection that begins at the level of the T7-8 disc space and
extends inferiorly to T10-11, unchanged in craniocaudal extent. AP
thickness measures approximately 5 mm.

Paraspinal and other soft tissues: Loculated bilateral pleural
collections. Small amount of fluid adjacent to the left T12 spinal
rod. The fluid at the right spinal rod has substantially resolved.

Disc levels:

There is abnormal disc signal at T9-10 and T10-11. The T10-11
finding is unchanged but the T9-10 abnormality is new.

Circumferential dural thickening and enhancement at the T10 level
effaces the thecal sac. This is poorly visualized on the prior study
on multiple sequences, but appears to be unchanged based on the
axial T2-weighted imaging.

MRI LUMBAR SPINE FINDINGS

Segmentation:  Standard

Alignment:  Normal

Vertebrae: Posterior fusion hardware extends inferiorly to the
sacrum. There are interbody spacers at L4-5 and L5-S1.

Conus medullaris: Extends to the L1-2 level and appears normal.

Paraspinal and other soft tissues: Negative.

Disc levels:

Degenerative findings are unchanged since [DATE]

L1-2: Normal disc.  No stenosis.

L2-3: Disc desiccation without spinal canal stenosis.

L3-4: Posterior decompression no spinal canal stenosis.

L4-5: Posterior decompression.  Mild left foraminal stenosis.

L5-S1: No disc herniation.  Mild left foraminal stenosis.
IMPRESSION: 1. Unchanged appearance of ventral epidural abscess extending from
T7-T11 with likely source at the T10-11 disc space. Circumferential
dural thickening at the T10 level again effaces the thecal sac with
mass effect on the spinal cord.
2. New abnormal disc signal at T9-10 and unchanged abnormal signal
T10-11, consistent with discitis-osteomyelitis.
3. No lumbar spinal canal stenosis.
4. Loculated bilateral pleural collections.

## 2021-01-28 IMAGING — US US EXTREM LOW*R* COMPLETE
1 series · 14 of 25 positions shown · non-contrast
Comparison: None.

CLINICAL DATA: Swelling assess for knee effusion

EXAM:
ULTRASOUND right LOWER EXTREMITY LIMITED
TECHNIQUE: Ultrasound examination of the lower extremity soft tissues was
performed in the area of clinical concern.

[Series 1: us complete joint space structures low right · 14 of 40 slices shown]
[im 1/40]
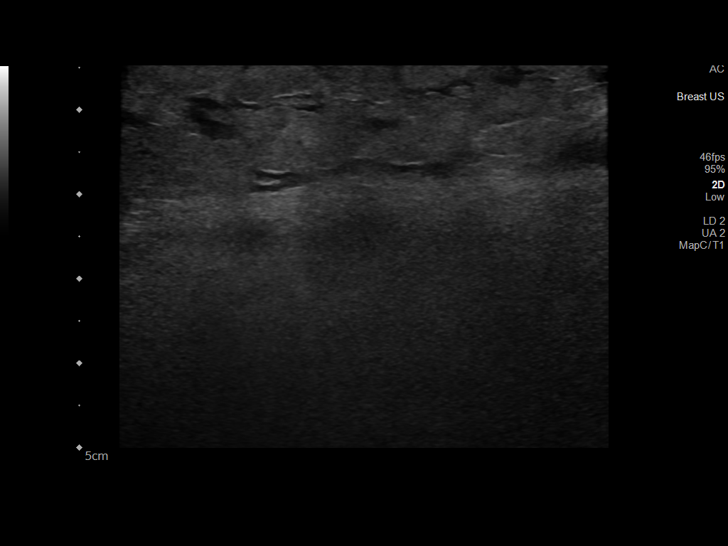
[im 4/40]
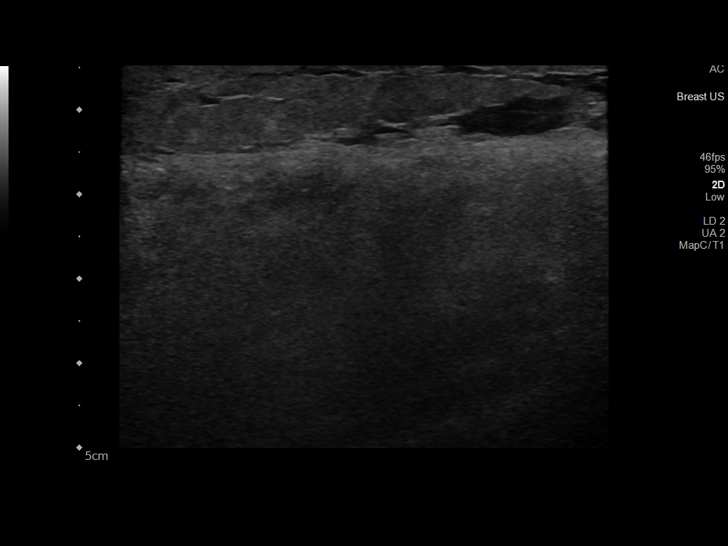
[im 7/40]
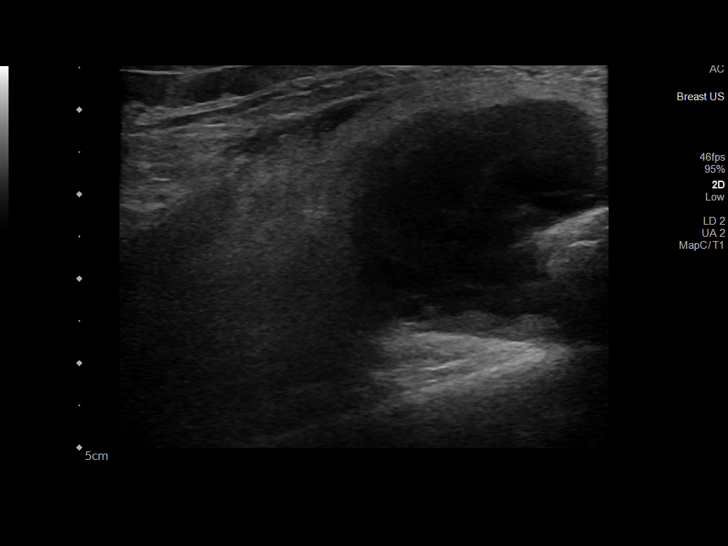
[im 10/40]
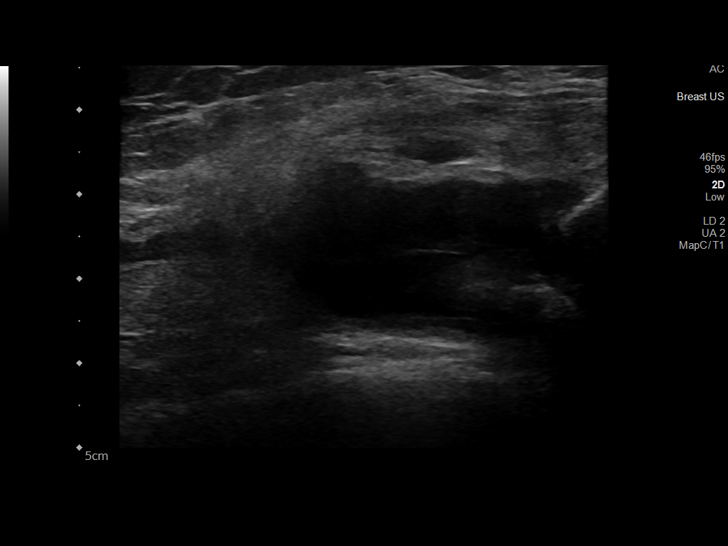
[im 14/40]
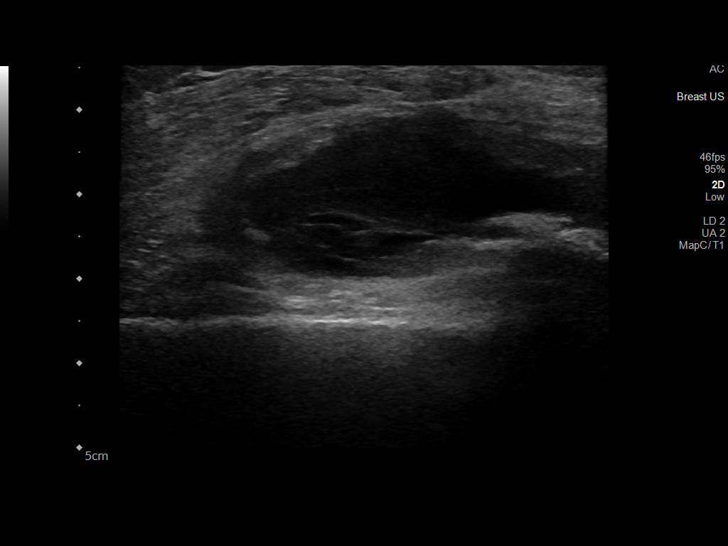
[im 15/40]
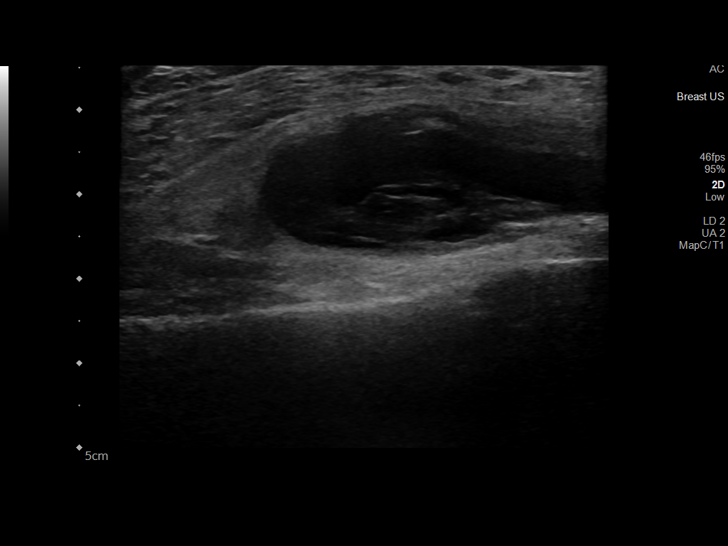
[im 18/40]
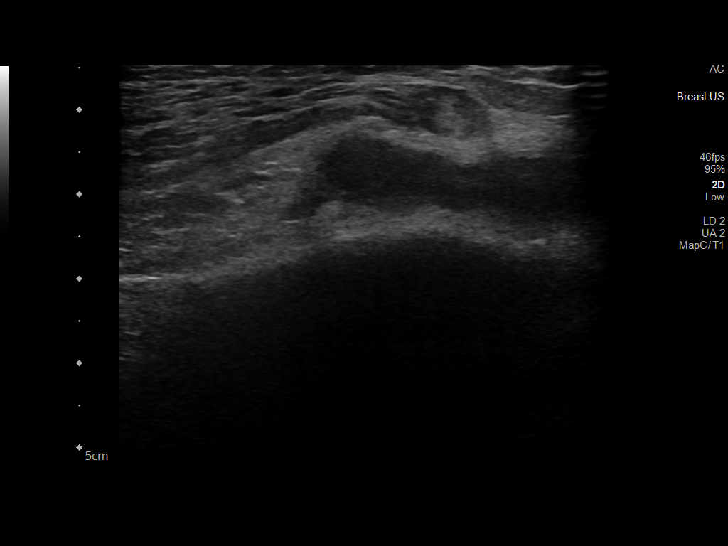
[im 22/40]
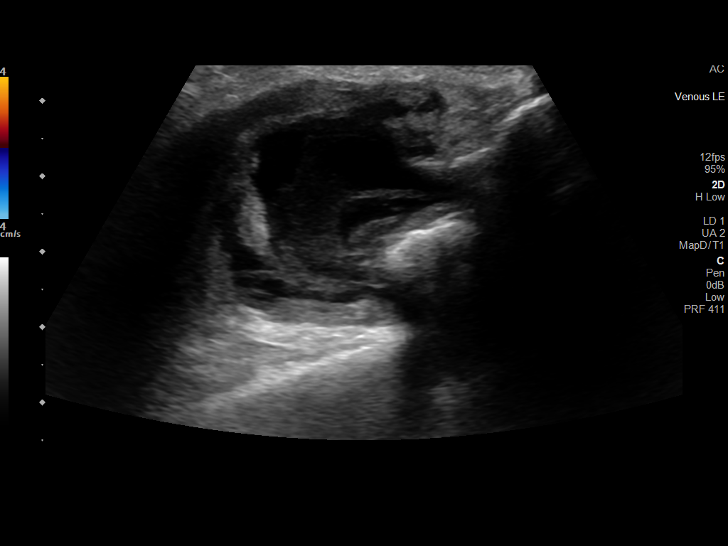
[im 25/40]
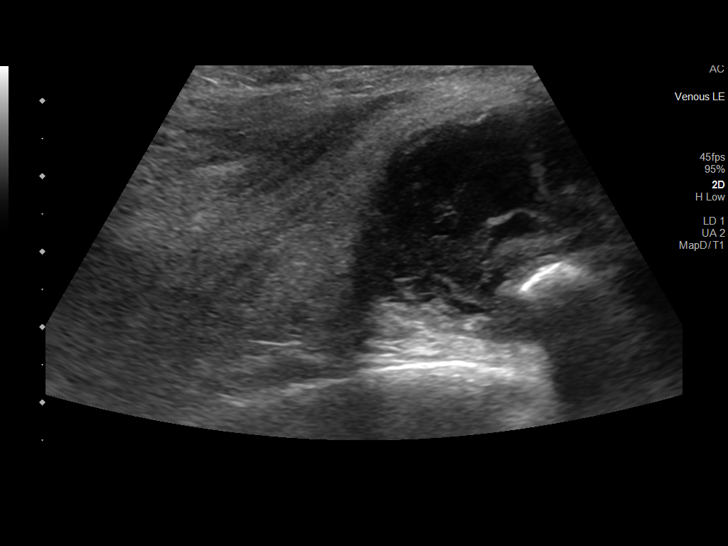
[im 27/40]
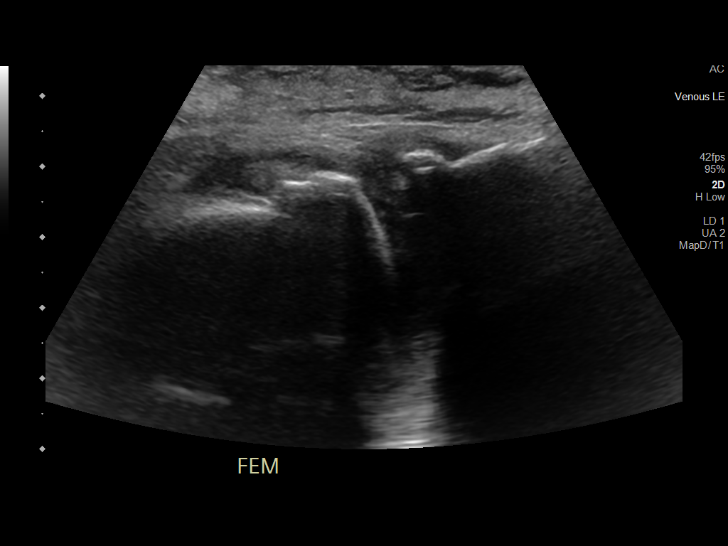
[im 30/40]
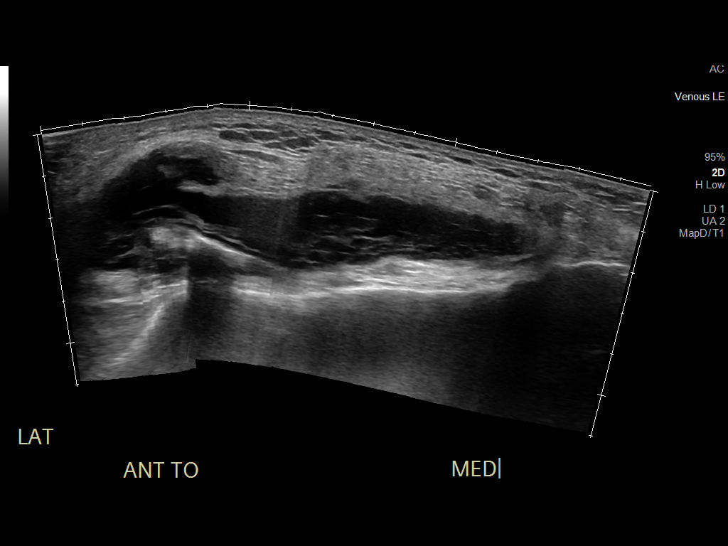
[im 33/40]
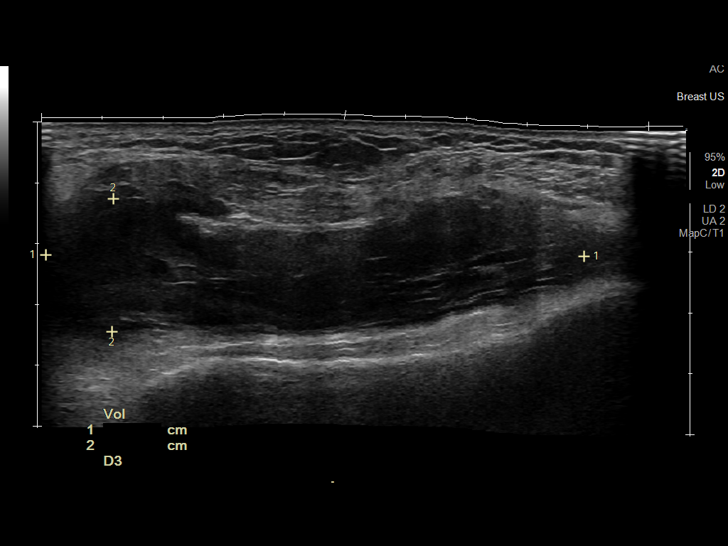
[im 36/40]
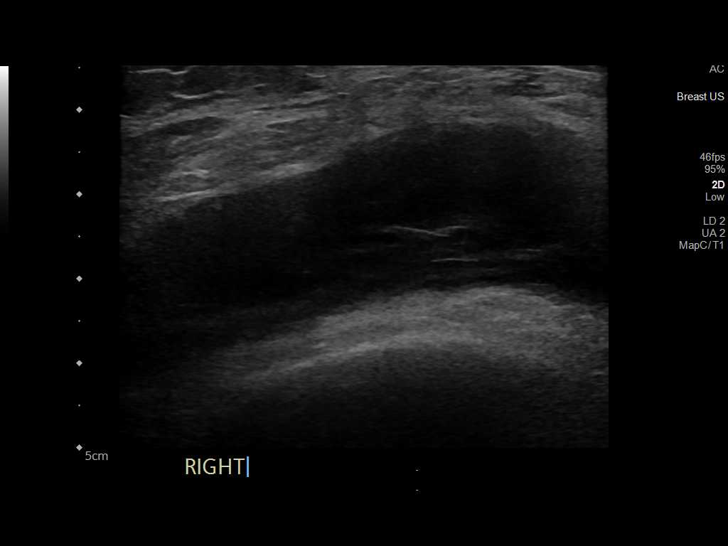
[im 40/40]
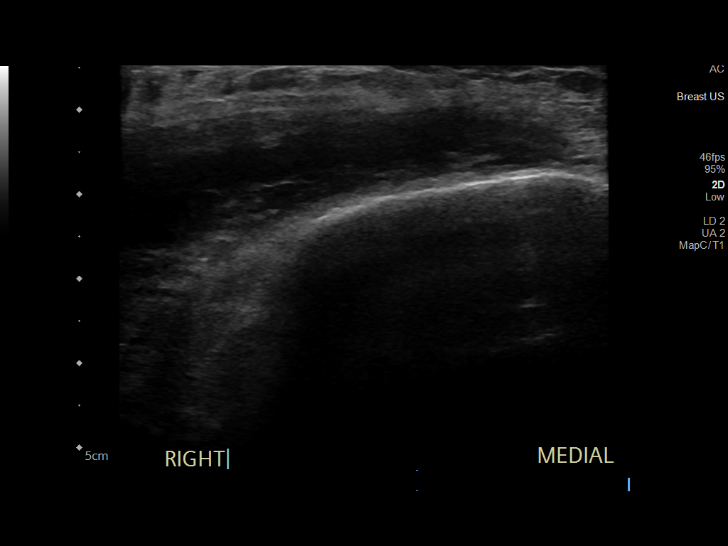

[14 of 25 positions shown; findings below may reference images not displayed]

FINDINGS: Ultrasound evaluation of the right knee performed in the region of
redness and swelling. Diffuse subcutaneous edema. Large complex
fluid collection superior to the patella measuring 8.8 x 2.7 x
cm with estimated volume of 28 mL.
IMPRESSION: Subcutaneous edema with large complex fluid collection at the
anterior knee superior to the patella which may reflect infected or
inflammatory fluid collection, or potentially hematoma. This would
be better characterized by MRI.

## 2021-01-28 MED ORDER — SODIUM CHLORIDE 0.9 % IV SOLN: INTRAVENOUS | Status: DC

## 2021-01-28 MED ORDER — BUPIVACAINE HCL (PF) 0.5 % IJ SOLN
10.0000 mL | Freq: Once | INTRAMUSCULAR | Status: AC
  Administered 2021-01-29: 10 mL
  Filled 2021-01-28: qty 10

## 2021-01-28 MED ORDER — GADOBUTROL 1 MMOL/ML IV SOLN
10.0000 mL | Freq: Once | INTRAVENOUS | Status: AC | PRN
  Administered 2021-01-28: 10 mL via INTRAVENOUS

## 2021-01-28 NOTE — Hospital Course (Addendum)
     Agree with above. This is a 70 year old gentleman with bilateral pleural effusions.  They have previously been treated with pigtail catheters.  Based on my review of the imaging there is a dominant collection in the posterior gutter on the right that is not very big.  In my opinion I feel that this would be amenable to an image guided catheter placement.  This can also be done on the left side.  I think that given his clinical status and the size of the effusions bilateral surgical decortication would be challenging for this patient to recover from.   Diagnosis: Left Retroperitoneal Collection   Procedure: DRAINAGE CATHETER PLACEMENT OF LEFT RETROPERITONEAL COLLECTION   Anesthesia: Conscious Sedation Complications: None Estimated Blood Loss:  <5 mL Specimens: Sent for Gram Stain, Aerobe Culture, Anerobe Culture, and Body Fluid Creatinine   Findings: Successful CT-guided placement of 12 F catheter into Left Retroperitoneal Collection.   Plan: - Record drain output every 12 hours. - Follow up drain evaluation / sinogram in 2 week(s).   See detailed procedure note with images in PACS. The patient tolerated the procedure well without incident or complication and was returned to Floor Bed in stable condition.

## 2021-01-28 NOTE — Progress Notes (Addendum)
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 s/p drainage of left retroperitoneal fluid collection by IR on 8/2.   Assessment RT knee swelling and tenderness - Ortho has been consulted, plan for joint aspiration noted. MRI rt knee has been ordered  Urinary incontinence: patient tells me he is able to tell when he has to pee but unsure about when he has stopped urinating. He has not had a BM for couple of days now. He has excruciating pain on attempting to lift his left leg.   Large left retroperitoneal fluid collection S/p drainage by OR on 8/2. Cx NG in less than 24 hrs   Bilateral pleural effusion, exudative ( Possible empyema).  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 coli  Recommendations Continue cefazolin Discussed with [facility] for new onset urinary incontinence, agreed to  get a MRI TL spine to look for any worsening of abscess.  I had ordered an US of rt knee this morning. Ortho later consulted for possible rt knee septic arthritis for aspiration. Please send sample for crystals, cell count, glucose, protein, gram stain and cultures  MRI rt knee has been ordered  Following cultures Monitor CBC and BMP   Rest of the management as per the primary team. Thank you for the consult. Please page with pertinent questions or concerns.  ______________________________________________________________________ Subjective patient seen and examined at the bedside.  Complains of pain at the rt knee. Also complains of issue with urination where he is able to tell when he has to urinate but not able to tell; when he finished urinating. He has difficulty lifting his left leg above the bed due to pain.   Vitals BP (!) 154/78 (BP Location: Left Arm)   Pulse 87   Temp 99.5 F (37.5 C) (Oral)   Resp 14   Wt 121.2 kg   SpO2 94%   BMI 41.85 kg/m     Physical Exam Constitutional: Lying in the bed and appears comfortable    Comments:   Cardiovascular:     Rate and Rhythm: Normal rate and regular rhythm.     Heart sounds:  Pulmonary:     Effort: on room air     Comments:   Abdominal:     Palpations: Abdomen is soft.     Tenderness: Non tender and non distended   Musculoskeletal:        General: Rt knee swollen/tender and restricted ROM  Skin:    Comments: No lesions or rashes   Neurological:  General: Limited exam, aa03, difficulty raising left leg due to severe pain   Psychiatric:        Mood and Affect: Mood normal.   Pertinent Microbiology Results for orders placed or performed during the hospital encounter of 01/20/21  MRSA Next Gen by PCR, Nasal     Status: None   Collection Time: 01/20/21  5:07 PM   Specimen: Nasal Mucosa; Nasal Swab  Result Value Ref Range Status   MRSA by PCR Next Gen NOT DETECTED NOT DETECTED Final    Comment: (NOTE) The  GeneXpert MRSA Assay (FDA approved for NASAL specimens only), is one component of a comprehensive MRSA colonization surveillance program. It is not intended to diagnose MRSA infection nor to guide or monitor treatment for MRSA infections. Test performance is not FDA approved in patients less than 70 years old. Performed at [facility], [address] [place], [place] [address]   Culture, body fluid w Gram Stain-bottle     Status: None   Collection Time: 01/20/21  6:30 PM   Specimen: Fluid  Result Value Ref Range Status   Specimen Description FLUID LEFT PLEURAL  Final   Special Requests   Final    BOTTLES DRAWN AEROBIC AND ANAEROBIC Blood Culture adequate volume   Culture   Final    NO GROWTH 5 DAYS Performed at [facility], [address] [place], [place] [address]    Report Status 01/25/2021 FINAL  Final  Gram stain     Status: None   Collection Time: 01/20/21  6:30 PM   Specimen: Fluid  Result Value Ref Range Status   Specimen Description FLUID LEFT PLEURAL  Final   Special Requests NONE  Final   Gram Stain   Final    MODERATE WBC PRESENT, PREDOMINANTLY PMN NO ORGANISMS SEEN Performed at [facility], [address], [place], [place] [address]    Report Status 01/21/2021 FINAL  Final  Culture, body fluid w Gram Stain-bottle     Status: None   Collection Time: 01/20/21  6:30 PM   Specimen: Fluid  Result Value Ref Range Status   Specimen Description FLUID RIGHT PLEURAL  Final   Special Requests   Final    BOTTLES DRAWN AEROBIC AND ANAEROBIC Blood Culture adequate volume   Culture   Final    NO GROWTH 5 DAYS Performed at [facility], [address], [place], [place] [address]    Report Status 01/25/2021 FINAL  Final  Gram stain     Status: None   Collection Time: 01/20/21  6:30 PM   Specimen: Fluid  Result Value Ref Range Status   Specimen Description FLUID RIGHT PLEURAL  Final   Special Requests NONE  Final   Gram Stain   Final     ABUNDANT WBC PRESENT, PREDOMINANTLY PMN NO ORGANISMS SEEN Performed at [facility], [address] [place], [place] [address]    Report Status 01/21/2021 FINAL  Final  Culture, blood (routine x 2)     Status: None   Collection Time: 01/20/21  8:39 PM   Specimen: BLOOD LEFT ARM  Result Value Ref Range Status   Specimen Description BLOOD LEFT ARM  Final   Special Requests   Final    BOTTLES DRAWN AEROBIC AND ANAEROBIC Blood Culture adequate volume   Culture   Final    NO GROWTH 5 DAYS Performed at [facility], [address] [place], [place] [address]  Report Status 01/25/2021 FINAL  Final  Culture, blood (routine x 2)     Status: None   Collection Time: 01/20/21  8:39 PM   Specimen: BLOOD LEFT HAND  Result Value Ref Range Status   Specimen Description BLOOD LEFT HAND  Final   Special Requests   Final    BOTTLES DRAWN AEROBIC AND ANAEROBIC Blood Culture adequate volume   Culture   Final    NO GROWTH 5 DAYS Performed at [facility], [address] [place], [place] [address]    Report Status 01/25/2021 FINAL  Final  Urine Culture     Status: None   Collection Time: 01/21/21  7:31 AM   Specimen: Urine, Clean Catch  Result Value Ref Range Status   Specimen Description URINE, CLEAN CATCH  Final   Special Requests NONE  Final   Culture   Final    NO GROWTH Performed at [facility], [address] [place], [place] [address]    Report Status 01/22/2021 FINAL  Final  Culture, blood (routine x 2)     Status: None   Collection Time: 01/23/21  9:52 PM   Specimen: BLOOD  Result Value Ref Range Status   Specimen Description BLOOD LEFT ANTECUBITAL  Final   Special Requests   Final    BOTTLES DRAWN AEROBIC AND ANAEROBIC Blood Culture adequate volume   Culture   Final    NO GROWTH 5 DAYS Performed at [facility], [address], [place], [place] [address]    Report Status 01/28/2021 FINAL  Final  Culture, blood (routine x 2)     Status: None    Collection Time: 01/23/21  9:52 PM   Specimen: BLOOD  Result Value Ref Range Status   Specimen Description BLOOD LEFT ANTECUBITAL  Final   Special Requests   Final    BOTTLES DRAWN AEROBIC AND ANAEROBIC Blood Culture adequate volume   Culture   Final    NO GROWTH 5 DAYS Performed at [facility], [address] [place], [place] [address]    Report Status 01/28/2021 FINAL  Final  Culture, blood (routine x 2)     Status: None (Preliminary result)   Collection Time: 01/26/21  7:52 AM   Specimen: BLOOD  Result Value Ref Range Status   Specimen Description BLOOD RIGHT ANTECUBITAL  Final   Special Requests   Final    BOTTLES DRAWN AEROBIC AND ANAEROBIC Blood Culture adequate volume   Culture   Final    NO GROWTH 2 DAYS Performed at [facility], [address], [place], [place] [address]    Report Status PENDING  Incomplete  Culture, blood (routine x 2)     Status: None (Preliminary result)   Collection Time: 01/26/21  7:53 AM   Specimen: BLOOD  Result Value Ref Range Status   Specimen Description BLOOD RIGHT ANTECUBITAL  Final   Special Requests   Final    BOTTLES DRAWN AEROBIC AND ANAEROBIC Blood Culture adequate volume   Culture   Final    NO GROWTH 2 DAYS Performed at [facility], [address] [place], [place] [address]    Report Status PENDING  Incomplete  Aerobic/Anaerobic Culture w Gram Stain (surgical/deep wound)     Status: None (Preliminary result)   Collection Time: 01/27/21  5:25 PM   Specimen: Abscess  Result Value Ref Range Status   Specimen Description ABSCESS  Final   Special Requests Normal  Final   Gram Stain   Final    RARE WBC PRESENT,BOTH PMN AND MONONUCLEAR NO ORGANISMS SEEN    Culture   Final    NO GROWTH < 24 HOURS Performed at [facility], [address] [place], [place] [address]    Report Status PENDING  Incomplete     Pertinent Lab. CBC Latest Ref Rng & Units 01/28/2021 01/24/2021 01/21/2021  WBC 4.0 - 10.5  K/uL 17.5(H) 16.5(H) 23.2(H)  Hemoglobin 13.0 - 17.0 g/dL 2.6(Z) 1.2(W) 5.8(K)  Hematocrit 39.0 - 52.0 % 28.7(L) 30.2(L) 30.3(L)  Platelets 150 - 400 K/uL 362 380 475(H)   CMP Latest Ref Rng & Units 01/28/2021 01/26/2021 01/24/2021  Glucose 70 - 99 mg/dL 998(P) 382(N) 053(Z)  BUN 8 - 23 mg/dL 22 18 19   Creatinine 0.61 - 1.24 mg/dL 7.67 3.41  Sodium 135 - 145 mmol/L 130(L) 127(L) 129(L)  Potassium 3.5 - 5.1 mmol/L 3.7 3.6 4.6  Chloride 98 - 111 mmol/L 86(L) 85(L) 92(L)  CO2 22 - 32 mmol/L 34(H) 30 29  Calcium 8.9 - 10.3 mg/dL 7.6(L) 7.8(L) 7.6(L)  Total Protein 6.5 - 8.1 g/dL - - -  Total Bilirubin 0.3 - 1.2 mg/dL - - -  Alkaline Phos 38 - 126 U/L - - -  AST 15 - 41 U/L - - -  ALT 0 - 44 U/L - - -     Pertinent Imaging today Plain films and CT images have been personally visualized and interpreted; radiology reports have been reviewed. Decision making incorporated into the Impression / Recommendations.  TTE 01/21/21 1. Left ventricular ejection fraction, by estimation, is 65 to 70%. The left ventricle has normal function. The left ventricle has no regional wall motion abnormalities. There is mild left ventricular hypertrophy. Left ventricular diastolic parameters were normal. 2. Right ventricular systolic function is normal. The right ventricular size is normal. Tricuspid regurgitation signal is inadequate for assessing PA pressure. 3. The mitral valve is normal in structure. No evidence of mitral valve regurgitation. 4. The aortic valve was not well visualized. Aortic valve regurgitation is not visualized. No aortic stenosis is present. 5. The inferior vena cava is normal in size with greater than 50% respiratory variability, suggesting right atrial pressure of 3 mmHg. Conclusion(s)/Recommendation(s): No vegetation seen. If high clinical suspicion for endocarditis, consider TEE.  CT chest abdomen pelvis 01/26/21 IMPRESSION: Small loculated pleural fluid collections bilaterally  RIGHT greater than LEFT which contain foci of air, could either represent empyema or prior instrumentation/aspiration, slightly increased.   Compressive atelectasis in the lower lobes.   Loculated fluid versus less likely mass at medial RIGHT apex 2.3 x 2.1 cm; recommend attention on follow-up imaging.   Large LEFT retroperitoneal fluid collection 10.1 x 6.4 x 7.1 cm little changed, could represent infected or sterile collection.   Lucency surrounding the pedicle screws bilaterally at T10 favoring infection, question slightly increased on RIGHT since previous study.   Aortic Atherosclerosis (ICD10-I70.0).  I spent more than 35 minutes for this patient encounter including review of prior medical records, coordination of care  with greater than 50% of time being face to face/counseling and discussing diagnostics/treatment plan with the patient/family.  Electronically signed by:   03/28/21, MD Infectious Disease Physician [facility] for Infectious Disease Pager: [phone]

## 2021-01-28 NOTE — Consult Note (Signed)
Reason for Consult:Right knee pain Referring Physician: Pleas Koch Time called: 1200 Time at bedside: 1245   Marco Kennedy is an 70 y.o. male.  HPI: Marco Kennedy was admitted about 1 week ago with a post-surgical spinal infection that has been ongoing since 7/16. Yesterday he developed acute right knee pain. He notes a long hx/o arthritis but this feels very different. He denies hx/o gout. He has most of his pain superomedially but describes it as being in the joint.  Past Medical History:  Diagnosis Date   Arthritis    arthritis-bilateral knees, left Hip.   Degenerative lumbar spinal stenosis    EKG abnormality    06-08-16- being evaluated by cardiologist  in [place]   Idiopathic scoliosis of lumbar region    Joint stiffness    Knee pain    Leg swelling    Low back pain    Achy, shooting pain.  Hurts to Walk or stand up straight.   Lumbar radiculopathy    Scoliosis concern    Sleep apnea    Bipap use nightly 25/19 -3l/m oxygen   Spondylolisthesis of lumbar region    Transfusion history    [place] ,[place] after bleeding post colon polyp removal and colonscopy procedure    Past Surgical History:  Procedure Laterality Date   ABDOMINAL EXPOSURE N/A 04/18/2020   Procedure: ABDOMINAL EXPOSURE;  Surgeon: [name], MD;  Location: [facility] OR;  Service: Vascular;  Laterality: N/A;   ANTERIOR LAT LUMBAR FUSION Left 04/18/2020   Procedure: ANTERIOR LATERAL LUMBAR FUSION LUMBAR TWO-THREE, LUMBAR THREE-FOUR;  Surgeon: [name], MD;  Location: [facility] OR;  Service: Neurosurgery;  Laterality: Left;   ANTERIOR LUMBAR FUSION N/A 04/18/2020   Procedure: Lumbar Four-Five Lumbar Five- Sacral One Anterior lumbar Interbody Fusion;  Surgeon: [name], MD;  Location: [facility] OR;  Service: Neurosurgery;  Laterality: N/A;   APPLICATION OF INTRAOPERATIVE CT SCAN N/A 04/21/2020   Procedure: APPLICATION OF INTRAOPERATIVE CT SCAN;  Surgeon: [name], MD;  Location: [facility] OR;  Service:  Neurosurgery;  Laterality: N/A;   BACK SURGERY     lumbar disc surgery   COLONOSCOPY W/ POLYPECTOMY     HERNIA REPAIR Left    left inguinal hernia repair   KNEE ARTHROSCOPY Right 2003    -scope for meniscus   LUMBAR WOUND DEBRIDEMENT N/A 01/13/2021   Procedure: THORACOLUMBAR WOUND DEBRIDEMENT;  Surgeon: [name], MD;  Location: [facility] OR;  Service: Neurosurgery;  Laterality: N/A;   POSTERIOR LUMBAR FUSION 4 LEVEL N/A 04/21/2020   Procedure: Thoracic ten to pelvis pedicle screw fixation with osteotomies;  Surgeon: [name], MD;  Location: [facility] OR;  Service: Neurosurgery;  Laterality: N/A;   TOTAL HIP ARTHROPLASTY Left 06/15/2016   Procedure: LEFT TOTAL HIP ARTHROPLASTY ANTERIOR APPROACH;  Surgeon: [name], MD;  Location: [facility] ORS;  Service: Orthopedics;  Laterality: Left;    Family History  Problem Relation Age of Onset   Hypertension Mother    Arthritis Mother    Stomach cancer Father    Hypertension Brother     Social History:  reports that he has never smoked. He has never used smokeless tobacco. He reports current alcohol use. He reports that he does not use drugs.  Allergies: No Known Allergies  Medications: I have reviewed the patient's current medications.  Results for orders placed or performed during the hospital encounter of 01/20/21 (from the past 48 hour(s))  Basic metabolic panel     Status: Abnormal   Collection Time: 01/26/21  2:32  PM  Result Value Ref Range   Sodium 127 (L) 135 - 145 mmol/L   Potassium 3.6 3.5 - 5.1 mmol/L   Chloride 85 (L) 98 - 111 mmol/L   CO2 30 22 - 32 mmol/L   Glucose, Bld 261 (H) 70 - 99 mg/dL    Comment: Glucose reference range applies only to samples taken after fasting for at least 8 hours.   BUN 18 8 - 23 mg/dL   Creatinine, Ser 6.06 0.61 - 1.24 mg/dL   Calcium 7.8 (L) 8.9 - 10.3 mg/dL   GFR, Estimated >30 >16 mL/min    Comment: (NOTE) Calculated using the CKD-EPI Creatinine Equation (2021)    Anion gap 12 5 - 15     Comment: Performed at [facility], [address] [place], [place] [address]  Aerobic/Anaerobic Culture w Gram Stain (surgical/deep wound)     Status: None (Preliminary result)   Collection Time: 01/27/21  5:25 PM   Specimen: Abscess  Result Value Ref Range   Specimen Description ABSCESS    Special Requests Normal    Gram Stain      RARE WBC PRESENT,BOTH PMN AND MONONUCLEAR NO ORGANISMS SEEN    Culture      NO GROWTH < 24 HOURS Performed at [facility], [address] [place], [place] [address]    Report Status PENDING   Basic metabolic panel     Status: Abnormal   Collection Time: 01/28/21  5:11 AM  Result Value Ref Range   Sodium 130 (L) 135 - 145 mmol/L   Potassium 3.7 3.5 - 5.1 mmol/L   Chloride 86 (L) 98 - 111 mmol/L   CO2 34 (H) 22 - 32 mmol/L   Glucose, Bld 195 (H) 70 - 99 mg/dL    Comment: Glucose reference range applies only to samples taken after fasting for at least 8 hours.   BUN 22 8 - 23 mg/dL   Creatinine, Ser 3.22 0.61 - 1.24 mg/dL   Calcium 7.6 (L) 8.9 - 10.3 mg/dL   GFR, Estimated >02 >54 mL/min    Comment: (NOTE) Calculated using the CKD-EPI Creatinine Equation (2021)    Anion gap 10 5 - 15    Comment: Performed at [facility], [address] [place], [place] [address]  CBC     Status: Abnormal   Collection Time: 01/28/21  5:11 AM  Result Value Ref Range   WBC 17.5 (H) 4.0 - 10.5 K/uL   RBC 3.25 (L) 4.22 - 5.81 MIL/uL   Hemoglobin 8.9 (L) 13.0 - 17.0 g/dL   HCT 37.6 (L) 28.3 - 15.1 %   MCV 88.3 80.0 - 100.0 fL   MCH 27.4 26.0 - 34.0 pg   MCHC 31.0 30.0 - 36.0 g/dL   RDW 76.1 60.7 - 37.1 %   Platelets 362 150 - 400 K/uL   nRBC 0.0 0.0 - 0.2 %    Comment: Performed at [facility], [address], [place], [place] [address]    CT IMAGE GUIDED DRAINAGE BY PERCUTANEOUS CATHETER  Result Date: 01/27/2021 Marco Banning, MD     01/27/2021  2:06 PM Vascular and Interventional Radiology Procedure Note Patient: Marco Kennedy  DOB: 17-Aug-1950 Medical Record Number: [id number] Note Date/Time: 01/27/21 2:03 PM Performing Physician: Marco Banning, MD Assistant(s): None Diagnosis: Left Retroperitoneal Collection Procedure: DRAINAGE CATHETER PLACEMENT OF LEFT RETROPERITONEAL COLLECTION Anesthesia: Conscious Sedation Complications: None Estimated Blood Loss:  <5 mL Specimens: Sent for Gram Stain,  Aerobe Culture, Anerobe Culture, and Body Fluid Creatinine Findings: Successful CT-guided placement of 12 F catheter into Left Retroperitoneal Collection. Plan: - Record drain output every 12 hours. - Follow up drain evaluation / sinogram in 2 week(s). See detailed procedure note with images in PACS. The patient tolerated the procedure well without incident or complication and was returned to Floor Bed in stable condition. Marco Banning, MD Vascular and Interventional Radiology Specialists [facility] Pager. [phone] Clinic. [phone]    Review of Systems  HENT:  Negative for ear discharge, ear pain, hearing loss and tinnitus.   Eyes:  Negative for photophobia and pain.  Respiratory:  Negative for cough and shortness of breath.   Cardiovascular:  Negative for chest pain.  Gastrointestinal:  Negative for abdominal pain, nausea and vomiting.  Genitourinary:  Negative for dysuria, flank pain, frequency and urgency.  Musculoskeletal:  Positive for arthralgias (Right knee) and back pain. Negative for myalgias and neck pain.  Neurological:  Negative for dizziness and headaches.  Hematological:  Does not bruise/bleed easily.  Psychiatric/Behavioral:  The patient is not nervous/anxious.   Blood pressure (!) 154/78, pulse 87, temperature 99.5 F (37.5 C), temperature source Oral, resp. rate 14, weight 121.2 kg, SpO2 94 %. Physical Exam Constitutional:      General: He is not in acute distress.    Appearance: He is well-developed. He is not diaphoretic.  HENT:     Head: Normocephalic and atraumatic.  Eyes:     General: No scleral  icterus.       Right eye: No discharge.        Left eye: No discharge.     Conjunctiva/sclera: Conjunctivae normal.  Cardiovascular:     Rate and Rhythm: Normal rate and regular rhythm.  Pulmonary:     Effort: Pulmonary effort is normal. No respiratory distress.  Musculoskeletal:     Cervical back: Normal range of motion.     Comments: RLE No traumatic wounds, ecchymosis, or rash  Mild TTP, esp medially. Severe pain with PROM<10 degrees  Mod knee effusion  Sens DPN, SPN, TN intact  Motor EHL, ext, flex, evers 5/5  DP 2+, PT 0, 2+ edema  Skin:    General: Skin is warm and dry.  Neurological:     Mental Status: He is alert.  Psychiatric:        Mood and Affect: Mood normal.        Behavior: Behavior normal.    Assessment/Plan: Right knee pain -- Worrisome for septic arthritis given history. Will perform arthrocentesis. Please keep NPO until cell count resulted. Have added MRI knee since he'll already be there but may cancel if can get fluid first.    Freeman Caldron, PA-C Orthopedic Surgery [phone] 01/28/2021, 1:04 PM

## 2021-01-28 NOTE — Progress Notes (Signed)
Occupational Therapy Treatment Patient Details Name: Marco Kennedy MRN: [id number] DOB: 28-Jul-1950 Today's Date: 01/28/2021    History of present illness Pt is a 70 y.o. male who presented 01/20/21 with SOB, increased weight gain, back pain, and fever. Of note, recent admission from 7/16-22 for sepsis secondary to moderate-sized abscess near the pelvic hardware. CT of the chest show empyema as well as discitis osteomyelitis of T10. S/p bil chest tube placement 7/26, L chest tube removed 7/29. L JP drain insertion 8/2 PMH: back surgery with pelvis fixation in 03/2020, OSA on BIPAP, arthritis.   OT comments  Pt modA +2 for power up; pt standing momentarily before requiring seated rest break due to increased pain in RLE at knee. Pt O2 88-91% on RA; Pt requires1 L O2 with mobility and exercise. Pt would benefit from continued OT skilled services. Pt very motivated and would be appropriate for CIR especially when pain is more controlled. OT following acutely.    Follow Up Recommendations  CIR;Supervision/Assistance - 24 hour    Equipment Recommendations  3 in 1 bedside commode    Recommendations for Other Services Rehab consult    Precautions / Restrictions Precautions Precautions: Fall Precaution Comments: monitor SpO2; L  JP drain Restrictions Weight Bearing Restrictions: No       Mobility Bed Mobility Overal bed mobility: Needs Assistance Bed Mobility: Rolling;Sidelying to Sit;Sit to Sidelying Rolling: Min assist Sidelying to sit: Mod assist;+2 for physical assistance;HOB elevated     Sit to sidelying: Mod assist;+2 for physical assistance General bed mobility comments: Log roll and pt initiating well with use of rails.    Transfers Overall transfer level: Needs assistance Equipment used: Rolling walker (2 wheeled) Transfers: Sit to/from Stand Sit to Stand: +2 physical assistance;Mod assist         General transfer comment: modA +2 for power up; pt standing  momentarily before requiring seated rest break due to increased pain in RLE at knee.    Balance Overall balance assessment: Needs assistance Sitting-balance support: Bilateral upper extremity supported;Feet supported Sitting balance-Leahy Scale: Fair Sitting balance - Comments: increased pain; minguardA for safety   Standing balance support: Bilateral upper extremity supported;During functional activity Standing balance-Leahy Scale: Poor Standing balance comment: Reliant on UE support on RW and physical assistance; standing momentarily.                           ADL either performed or assessed with clinical judgement   ADL Overall ADL's : Needs assistance/impaired                         Toilet Transfer: Moderate assistance;+2 for physical assistance;+2 for safety/equipment Toilet Transfer Details (indicate cue type and reason): sit to stand only         Functional mobility during ADLs: Moderate assistance;+2 for physical assistance;+2 for safety/equipment;Cueing for safety;Cueing for sequencing General ADL Comments: Pt alert today, no pain meds prior to session. Pt with increased pain in R knee 9/10 pain. Pt limited by set-upA for light grooming and x1 sit to stand.     Vision   Vision Assessment?: No apparent visual deficits   Perception     Praxis      Cognition Arousal/Alertness: Awake/alert Behavior During Therapy: WFL for tasks assessed/performed Overall Cognitive Status: Within Functional Limits for tasks assessed  General Comments: Pt with clear cognition and able to follow all commands. Pt without pain meds prior to session so pt was alert and not drowsy.        Exercises     Shoulder Instructions       General Comments O2 88-91% on RA; Pt requires1 L O2 with mobility and exercise.    Pertinent Vitals/ Pain       Pain Assessment: 0-10 Pain Score: 9  Pain Location: R knee, RLE Pain  Descriptors / Indicators: Discomfort;Grimacing;Guarding;Sharp Pain Intervention(s): Monitored during session;Patient requesting pain meds-RN notified  Home Living                                          Prior Functioning/Environment              Frequency  Min 2X/week        Progress Toward Goals  OT Goals(current goals can now be found in the care plan section)  Progress towards OT goals: Progressing toward goals  Acute Rehab OT Goals Patient Stated Goal: spouse wants pt to try for CIR OT Goal Formulation: With patient/family Time For Goal Achievement: 02/07/21 Potential to Achieve Goals: Good  Plan Discharge plan remains appropriate    Co-evaluation    PT/OT/SLP Co-Evaluation/Treatment: Yes Reason for Co-Treatment: For patient/therapist safety;To address functional/ADL transfers   OT goals addressed during session: Strengthening/ROM      AM-PAC OT "6 Clicks" Daily Activity     Outcome Measure   Help from another person eating meals?: A Little Help from another person taking care of personal grooming?: A Little Help from another person toileting, which includes using toliet, bedpan, or urinal?: A Lot Help from another person bathing (including washing, rinsing, drying)?: A Lot Help from another person to put on and taking off regular upper body clothing?: A Little Help from another person to put on and taking off regular lower body clothing?: A Lot 6 Click Score: 15    End of Session Equipment Utilized During Treatment: Gait belt;Rolling walker;Oxygen  OT Visit Diagnosis: Unsteadiness on feet (R26.81);Other abnormalities of gait and mobility (R26.89);Muscle weakness (generalized) (M62.81);Pain;Other symptoms and signs involving cognitive function Pain - Right/Left: Left Pain - part of body: Leg   Activity Tolerance Patient limited by lethargy   Patient Left in chair;with call bell/phone within reach;with chair alarm set;with  family/visitor present   Nurse Communication Mobility status        Time: 3220-2542 OT Time Calculation (min): 30 min  Charges: OT General Charges $OT Visit: 1 Visit OT Treatments $Self Care/Home Management : 8-22 mins  Flora Lipps, OTR/L Acute Rehabilitation Services Pager: [phone] Office: [phone]    Chett Taniguchi C 01/28/2021, 9:40 AM

## 2021-01-28 NOTE — Progress Notes (Signed)
PROGRESS NOTE   Marco Kennedy  TJQ:[id number] DOB: 09/02/1950 DOA: 01/20/2021 PCP: Maximiano Coss, MD  Brief Narrative:  70 year old home dwelling male Obesity BMI 41, OSA on BiPAP Index back surgery 04/18/2020 multistage procedure ALIFL4-L5, L5-L1 and then T10 to pelvis pedicle screw fixation with osteotomies and posterior lateral arthrodesis + autograft + allograft Recent admission 7/16 through 01/16/2021 with sudden onset flank pain + nausea vomiting generalized weakness subjective fever--CT lumbar spine = moderate size abscess located near spinal hardware underwent wound debridement 01/13/2021 and found to have E. coli-ID saw the patient patient was de-escalated to cefazolin for 6-week treatment with PICC line to be placed  Patient was discharged home but over the course of the 22nd through the 26 had gained 18 pounds weight associated with early satiety with ongoing chills and inability to lay flat because of pain in his back In [place] ED where he was first evaluated he was tachycardic tachypneic hypoxic to 88% requiring 4 L oxygen CT showed presumed empyema of the lungs and there was concern for discitis osteomyelitis of T10 with a WBC in the 19 range Ancef vancomycin were started Neurosurgery consulted, pulmonary consulted Neurosurgery did not initially feel any need for operative management  Eventually thought to have empyema in addition to psoas collection causing sepsis    7/26 bilateral pigtail catheters performed 7/28 left chest tube removed 7/28 TTE no endocarditis-cardiology consulted for TEE 7/29 high-grade fevers despite antibiotics blood fluid cultures  7/30 right chest tube pulled 8/1 CT chest abdomen pelvis improving effusions but also showed 10 cm retroperitoneal collection 8/2 IR guided right peritoneal JP drain placement yielding 8/3 decreased sensation lower extremities, incontinence urine inability to sense when passing stool, also new onset right knee  effusion-orthopedics + neurosurgery consulted--MRI knee, MRI T/L-spine ordered  Hospital-Problem based course Prior E. coli bacteremia/  Psoas abscess drained 8/2 Blood cultures 7/26, 7/29 and 8/1 all negative--TTE 7/28 negative for vegetation Follow psoas cultures from JP drain 8/2 Empirically remains Ancef/ID-continue LR 75 cc/H Tylenol first choice oxycodone second choice IV morphine 4 mg every 2 as needed third choice for pain ?  Cauda equina,?  Right knee septic arthritis MRI knee, T-spine, L-spine pending--decisions to be made subsequent to this by orthopedics and neurosurgery who are aware of the same Continue antibiotics Orthopedics aware of need for sampling of right knee fluid Long discussion with family at the bedside explaining course to date Parapneumonic effusions?  Empyema Pulmonology recommends deferring further pleural procedures at this time Anasarca third spacing 2/2 poor nutrition Hold diuretics at this time OSA on CPAP, morbid obesity BMI 42 Life-threatening in the setting of critical illness  DVT prophylaxis: Lovenox Code Status: Full Family Communication: Discussed in detail with the patient's daughter at the bedside Disposition:  Status is: Inpatient  Remains inpatient appropriate because:Hemodynamically unstable, Ongoing diagnostic testing needed not appropriate for outpatient work up, and Unsafe d/c plan  Dispo: The patient is from: Home              Anticipated d/c is to: CIR              Patient currently is not medically stable to d/c.   Difficult to place patient No       Consultants:  Critical care medicine pulmonology Neurosurgery Infectious disease  Procedures: As above  Antimicrobials:    Currently on Ancef  Subjective: Related to physical therapy significant pain in the right knee and swelling which is relatively new Also reports to nursing  and myself that has not been able to pass urine or control his stool and was unaware of the  same over the past several hours starting yesterday He has never had this before he says Overall other than these new symptoms he feels fine He finds it very painful to raise his right knee He has not eaten today as he is probably going to be getting an MRI Note overnight fever of 100.5  Objective: Vitals:   01/28/21 0103 01/28/21 0158 01/28/21 0807 01/28/21 1220  BP:   118/78 (!) 154/78  Pulse: 95 95  87  Resp: (!) 21 20 (!) 24 14  Temp:   99.5 F (37.5 C) 99.5 F (37.5 C)  TempSrc:   Oral Oral  SpO2: 93% 93% 91% 94%  Weight:        Intake/Output Summary (Last 24 hours) at 01/28/2021 1244 Last data filed at 01/28/2021 0500 Gross per 24 hour  Intake 537 ml  Output 820 ml  Net -283 ml   Filed Weights   01/24/21 0505 01/25/21 0221 01/27/21 0412  Weight: 130.4 kg 130.2 kg 121.2 kg    Examination:  Coherent no distress thick neck moderate dentition Mallampati 4 CTA B no rales rhonchi Patient has a PICC line in the left arm He has a JP drain in his left lower quadrant abdomen is soft no rebound no guarding Significant swelling to the upper inner medial portion of the patella on the right side Straight leg raise on the left side is negative He is unable to however straight leg raise on the right side secondary to pain I was not able to get a clear cremasteric reflex-sensory to cold touch bilaterally in the L4-L5 and L2-L3 dermatomes seems to be intact His knee reflex on the left side was somewhat equivocal his ankle reflex on the same side was about the same Psych is euthymic  Data Reviewed: personally reviewed   CBC    Component Value Date/Time   WBC 17.5 (H) 01/28/2021 0511   RBC 3.25 (L) 01/28/2021 0511   HGB 8.9 (L) 01/28/2021 0511   HCT 28.7 (L) 01/28/2021 0511   PLT 362 01/28/2021 0511   MCV 88.3 01/28/2021 0511   MCH 27.4 01/28/2021 0511   MCHC 31.0 01/28/2021 0511   RDW 13.4 01/28/2021 0511   LYMPHSABS 0.8 01/24/2021 0253   MONOABS 1.3 (H) 01/24/2021 0253    EOSABS 0.3 01/24/2021 0253   BASOSABS 0.1 01/24/2021 0253   CMP Latest Ref Rng & Units 01/28/2021 01/26/2021 01/24/2021  Glucose 70 - 99 mg/dL 076(A) 263(F) 354(T)  BUN 8 - 23 mg/dL 22 18 19   Creatinine 0.61 - 1.24 mg/dL 6.25 6.38  Sodium 135 - 145 mmol/L 130(L) 127(L) 129(L)  Potassium 3.5 - 5.1 mmol/L 3.7 3.6 4.6  Chloride 98 - 111 mmol/L 86(L) 85(L) 92(L)  CO2 22 - 32 mmol/L 34(H) 30 29  Calcium 8.9 - 10.3 mg/dL 7.6(L) 7.8(L) 7.6(L)  Total Protein 6.5 - 8.1 g/dL - - -  Total Bilirubin 0.3 - 1.2 mg/dL - - -  Alkaline Phos 38 - 126 U/L - - -  AST 15 - 41 U/L - - -  ALT 0 - 44 U/L - - -     Radiology Studies: CT IMAGE GUIDED DRAINAGE BY PERCUTANEOUS CATHETER  Result Date: 01/27/2021 03/29/2021, MD     01/27/2021  2:06 PM Vascular and Interventional Radiology Procedure Note Patient: LENFORD BEDDOW DOB: 06-Jan-1951 Medical Record Number: 07/26/1950 Note Date/Time: 01/27/21  2:03 PM Performing Physician: Roanna Banning, MD Assistant(s): None Diagnosis: Left Retroperitoneal Collection Procedure: DRAINAGE CATHETER PLACEMENT OF LEFT RETROPERITONEAL COLLECTION Anesthesia: Conscious Sedation Complications: None Estimated Blood Loss:  <5 mL Specimens: Sent for Gram Stain, Aerobe Culture, Anerobe Culture, and Body Fluid Creatinine Findings: Successful CT-guided placement of 12 F catheter into Left Retroperitoneal Collection. Plan: - Record drain output every 12 hours. - Follow up drain evaluation / sinogram in 2 week(s). See detailed procedure note with images in PACS. The patient tolerated the procedure well without incident or complication and was returned to Floor Bed in stable condition. Roanna Banning, MD Vascular and Interventional Radiology Specialists [facility] Pager. [phone] Clinic. [phone]     Scheduled Meds:  Chlorhexidine Gluconate Cloth  6 each Topical Daily   docusate sodium  100 mg Oral BID   enoxaparin (LOVENOX) injection  40 mg Subcutaneous Daily   feeding  supplement  237 mL Oral TID BM   multivitamin with minerals  1 tablet Oral Daily   sodium chloride flush  10-40 mL Intracatheter Q12H   sodium chloride flush  3 mL Intravenous Q12H   Continuous Infusions:   ceFAZolin (ANCEF) IV 2 g (01/28/21 1144)     LOS: 8 days   Time spent: 60  Rhetta Mura, MD [facility] To contact the attending provider between 7A-7P or the covering provider during after hours 7P-7A, please log into the web site [email or web address] and access using universal [place] password for that web site. If you do not have the password, please call the hospital operator.  01/28/2021, 12:44 PM

## 2021-01-28 NOTE — Progress Notes (Signed)
   NAME:  Marco Kennedy, MRN:  [id number], DOB:  01/05/1951, LOS: 8 ADMISSION DATE:  01/20/2021, CONSULTATION DATE:  01/20/21 REFERRING MD:  Ophelia Charter, CHIEF COMPLAINT:  SOB   History of Present Illness:  70 year old man with hx of OSA on CPAP, recent lumbar hardware infection, epidural abscess who was Dc'd on prolonged IV antibiotics on 7/16 presenting with progressive SOB, chest pain, fevers, chills, and shoulder discomfort.  Imaging at [facility] ED revealed continued infection in thoracolumbar areas as well as new likely spread to pleura.  PCCM consulted to help manage pleural infection.  Pertinent  Medical History  OSA Pelvic hardware infection detailed in DC summary 7/22  Significant Hospital Events:  7/26 admitted from [facility] ED 7/27 changed to water seal. ID seeing, NSGY seeing 7/28 improved pleuritic pain, continues on water seal.  Still awaiting pleural cx. Left chest tube removed  7/29 200cc in 24 hours in the right tube 7/30 70cc in the last 24 hours in the right tube, chest tube fulled 8/1 CT Chest/A/Pelvis showed improved pleural effusions, 10cm RP collection 8/2 IR guided RP fluid collection drain placement  Interim History / Subjective:  Drain placed in IR yesterday. JP drain with serous drainage Having difficulty knowing when he is urinating, and was having fecal incontinence this morning.  Breathing stable. Afebrile.  Also having right knee pain he noticed yesterday afternoon.  Objective    Vitals:   01/28/21 0158 01/28/21 0807  BP:  118/78  Pulse: 95   Resp: 20 (!) 24  Temp:  99.5 F (37.5 C)  SpO2: 93% 91%    General: Pleasant elderly male lying in bed in NAD HEENT: mmm, large neck Neuro: Alert and oriented x3, non-focal  CV: RRR no mrg PULM: diminished bilaterally GI: obese, soft Extremities: right knee swelling and tenderness to palpation (this is new from the last two days)  CT C/A/P personally reviewed Small bilateral effusions remain in the pleural space  with gas secondary to instrumentation. There is mild compressive atelectasis. There is a large RP fluid collection of 10cm.   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  Would defer further interventions on his pleural space for now Discussed his new fecal incontinence and urinary symptoms with primary team, ID and nsgy. Plan is for MRI of lumbar and thoracic spine Ultrasound of right knee joint to eval for septic arthritis ID following closely.   PCCM will follow peripherally as his respiratory symptoms improve but please call us back if we can be of further assistance.   Signature:   Durel Salts, MD Pulmonary and Critical Care Medicine [facility]

## 2021-01-28 NOTE — Progress Notes (Signed)
Referring Physician(s): Dr. West Bali, S.   Supervising Physician: Ruthann Cancer  Patient Status:  [facility] - In-pt  Chief Complaint:  Left retroperitoneal fluid collection S/p drain placement with IR on 8/2   Subjective:  Pt laying in bed, not in acute distress.  Reports sharp right knee pain since last night, states that Marco Kennedy has bad knees due to arthritis but this pain is new and different from the chronic knee pain.  States that Marco Kennedy had a fever and nausea overnight, received some medications and now they are resolved.  No complaints regarding L RP drain, denies chills, N/V.   Allergies: Patient has no known allergies.  Medications: Prior to Admission medications   Medication Sig Start Date End Date Taking? Authorizing Provider  ceFAZolin (ANCEF) IVPB Inject 2 g into the vein every 8 (eight) hours. Indication:  vertebral osteomyelitis First Dose: No Last Day of Therapy:  02/24/2021 Labs - Once weekly:  CBC/D and BMP, Labs - Every other week:  ESR and CRP Method of administration: IV Push Method of administration may be changed at the discretion of home infusion pharmacist based upon assessment of the patient and/or caregiver's ability to self-administer the medication ordered. 01/15/21 02/24/21  Marvis Moeller, NP  cholecalciferol (VITAMIN D3) 25 MCG (1000 UNIT) tablet Take 1,000 Units by mouth daily.    [provider]  diclofenac (VOLTAREN) 75 MG EC tablet Take 75 mg by mouth 2 (two) times daily. 11/13/20   [provider]  Multiple Vitamin (MULTIVITAMIN WITH MINERALS) TABS tablet Take 1 tablet by mouth daily.    [provider]  oxyCODONE (OXY IR/ROXICODONE) 5 MG immediate release tablet Take 1 tablet (5 mg total) by mouth every 6 (six) hours as needed for moderate pain. 01/15/21   Little Ishikawa, MD  polyethylene glycol (MIRALAX / GLYCOLAX) 17 g packet Take 17 g by mouth 2 (two) times daily. 01/15/21   Little Ishikawa, MD     Vital  Signs: BP (!) 134/58 (BP Location: Left Arm)   Pulse 95   Temp (!) 100.5 F (38.1 C) (Oral)   Resp 20   Wt 267 lb 3.2 oz (121.2 kg)   SpO2 93%   BMI 41.85 kg/m   Physical Exam Vitals reviewed.  Constitutional:      Appearance: Normal appearance.  HENT:     Head: Normocephalic and atraumatic.  Pulmonary:     Effort: Pulmonary effort is normal.  Abdominal:     General: Abdomen is flat.     Palpations: Abdomen is soft.  Skin:    General: Skin is warm and dry.     Coloration: Skin is not jaundiced or pale.     Comments: Positive L RP drain to a suction bulb. Site is unremarkable with no erythema, edema, tenderness, bleeding or drainage. Stat lock in place. Dressing is clean, dry, and intact. 10 ml of  clear yello colored fluid noted in the bulb. Drain aspirates and flushes well.    Neurological:     Mental Status: Marco Kennedy is alert and oriented to person, place, and time.  Psychiatric:        Mood and Affect: Mood normal.        Behavior: Behavior normal.    Imaging: CT CHEST ABDOMEN PELVIS W CONTRAST  Result Date: 01/26/2021 CLINICAL DATA:  Empyema, disseminated E coli infection question intra-abdominal abscess, sepsis secondary to discitis and osteomyelitis, E coli in an abscess near pelvic hardware, back pain EXAM: CT CHEST, ABDOMEN,  AND PELVIS WITH CONTRAST TECHNIQUE: Multidetector CT imaging of the chest, abdomen and pelvis was performed following the standard protocol during bolus administration of intravenous contrast. Sagittal and coronal MPR images reconstructed from axial data set. CONTRAST:  171m OMNIPAQUE IOHEXOL 300 MG/ML SOLN IV. No oral contrast. COMPARISON:  CT abdomen and pelvis 01/10/2021 FINDINGS: CT CHEST FINDINGS Cardiovascular: Minimal atherosclerotic calcification aorta without aneurysm. Heart unremarkable. No pericardial effusion. Pulmonary arteries grossly patent on nondedicated exam. Mediastinum/Nodes: No thoracic adenopathy. RIGHT arm PICC line with tip in SVC.  Base of cervical region normal appearance. Esophagus unremarkable. Lungs/Pleura: Small loculated pleural fluid collections bilaterally RIGHT greater than LEFT which contain foci of air, could either represent empyema or prior instrumentation/aspiration. Compressive atelectasis in the lower lobes bilaterally. Loculated fluid versus less likely mass at medial RIGHT apex, recommend attention on follow-up imaging, 2.3 x 2.1 cm image 10, fluid attenuation. No infiltrate or additional mass identified. Musculoskeletal: Spinal fixation hardware T10-S2. Thoracic portion of hardware appears intact. Lucency seen surrounding pedicle screws at T10 adjacent to loculated pleural collection at the inferior RIGHT chest medially favor infected hardware. CT ABDOMEN PELVIS FINDINGS Hepatobiliary: Gallbladder and liver normal appearance Pancreas: Normal appearance Spleen: Normal appearance Adrenals/Urinary Tract: Adrenal glands, kidneys, ureters, and bladder normal appearance Stomach/Bowel: Normal appendix. Stomach and bowel loops normal appearance. Vascular/Lymphatic: Atherosclerotic calcifications aorta and iliac arteries without aneurysm. Normal sized retroperitoneal nodes. Reproductive: Unremarkable seminal vesicles and prostate gland Other: No free air or free fluid. Large fluid collection again identified LEFT retroperitoneal 10.1 x 6.4 x 7.1 cm, could represent infected or sterile collection. Trace free pelvic fluid. Scattered stranding of subcutaneous tissue planes. Musculoskeletal: Orthopedic hardware T10-S2. LEFT hip prosthesis. Probable bone island RIGHT femoral neck. IMPRESSION: Small loculated pleural fluid collections bilaterally RIGHT greater than LEFT which contain foci of air, could either represent empyema or prior instrumentation/aspiration, slightly increased. Compressive atelectasis in the lower lobes. Loculated fluid versus less likely mass at medial RIGHT apex 2.3 x 2.1 cm; recommend attention on follow-up  imaging. Large LEFT retroperitoneal fluid collection 10.1 x 6.4 x 7.1 cm little changed, could represent infected or sterile collection. Lucency surrounding the pedicle screws bilaterally at T10 favoring infection, question slightly increased on RIGHT since previous study. Aortic Atherosclerosis (ICD10-I70.0). Electronically Signed   By: MLavonia DanaM.D.   On: 01/26/2021 11:40   CT IMAGE GUIDED DRAINAGE BY PERCUTANEOUS CATHETER  Result Date: 01/27/2021 MMichaelle Birks MD     01/27/2021  2:06 PM Vascular and Interventional Radiology Procedure Note Patient: Marco WENDORFFDOB: [birth year]Medical Record Number: [id number] Date/Time: 01/27/21 2:03 PM Performing Physician: JMichaelle Birks MD Assistant(s): None Diagnosis: Left Retroperitoneal Collection Procedure: DRAINAGE CATHETER PLACEMENT OF LEFT RETROPERITONEAL COLLECTION Anesthesia: Conscious Sedation Complications: None Estimated Blood Loss:  <5 mL Specimens: Sent for Gram Stain, Aerobe Culture, Anerobe Culture, and Body Fluid Creatinine Findings: Successful CT-guided placement of 12 F catheter into Left Retroperitoneal Collection. Plan: - Record drain output every 12 hours. - Follow up drain evaluation / sinogram in 2 week(s). See detailed procedure note with images in PACS. The patient tolerated the procedure well without incident or complication and was returned to Floor Bed in stable condition. JMichaelle Birks MD Vascular and Interventional Radiology [facility] Pager. [phone]   Labs:  CBC: Recent Labs    01/20/21 2039 01/21/21 0025 01/24/21 0253 01/28/21 0511  WBC 29.0* 23.2* 16.5* 17.5*  HGB 8.9* 9.7* 9.9* 8.9*  HCT 28.4* 30.3* 30.2* 28.7*  PLT 594* 475* 380  362    COAGS: No results for input(s): INR, APTT in the last 8760 hours.  BMP: Recent Labs    01/21/21 0025 01/24/21 0253 01/26/21 1432 01/28/21 0511  NA 134* 129* 127* 130*  K 4.1 4.6 3.6 3.7  CL 98 92* 85* 86*  CO2 29 29 30  34*   GLUCOSE 152* 184* 261* 195*  BUN 18 19 18 22   CALCIUM 7.6* 7.6* 7.8* 7.6*  CREATININE 0.86 0.81 1.08 1.01  GFRNONAA >60 >60 >60 >60    LIVER FUNCTION TESTS: Recent Labs    01/13/21 0048 01/14/21 0256 01/15/21 0600 01/20/21 2039  BILITOT 1.7* 1.4* 1.3* 0.5  AST 131* 137* 66* 37  ALT 101* 98* 70* 13  ALKPHOS 94 87 81 92  PROT 5.3* 4.9* 4.8* 5.1*  ALBUMIN 2.0* 1.9* 1.9* 1.7*    Assessment and Plan:  70 yo male with history of lumbar spinal stenosis s/p lumbar fusions on multiple level on 04/18/2020.  History of abscess development near existing hardware s/p wound debridement by neurosurgery on 01/13/2021.  Currently admitted due to chest pain and anasarca, CT showed bilateral pleural effusion s/p chest tube placement by CCM and subsequently removed.  Repeat CT CAP on 01/26/2021 showed large left retroperitoneal fluid collection, patient is s/p aspiration and drain placement with IR on 01/27/2021.   Pt stable, drain intact, flushes and aspirates well. Puncture site unremarkable, no s/s of bleeding or infection.  OP 270 cc clear yellow  VS patient had fever this morning, T max 100.5. VS stable otherwise.  WBC 17.5 today (16.5 4 days ago) Cx pending, rare WBC presents so far   Continue with flushing TID, output recording q shift and dressing changes as needed. Would consider additional imaging when output is less than 10 ml for 24 hours not including flush material.    Further treatment plan per PCCM/  Appreciate and agree with the plan.  IR to follow.    Electronically Signed: Tera Mater, PA-C 01/28/2021, 9:02 AM   I spent a total of 15 Minutes at the the patient's bedside AND on the patient's hospital floor or unit, greater than 50% of which was counseling/coordinating care for L RP drain

## 2021-01-28 NOTE — Progress Notes (Signed)
Physical Therapy Treatment Patient Details Name: Marco Kennedy MRN: [id number] DOB: [birth year] Today's Date: 01/28/2021    History of Present Illness Pt is a 70 y.o. male who presented 01/20/21 with SOB, increased weight gain, back pain, and fever. Of note, recent admission from 7/16-22 for sepsis secondary to moderate-sized abscess near the pelvic hardware. CT of the chest show empyema as well as discitis osteomyelitis of T10. S/p bil chest tube placement 7/26, L chest tube removed 7/29. L JP drain insertion 8/2 PMH: back surgery with pelvis fixation in 03/2020, OSA on BIPAP, arthritis.    PT Comments    Pt reporting acute right knee pain; increased to 9/10 with range of motion or weightbearing. Noted to be swollen, warm, and spongy to touch along lateral aspect. Pt able to participate in gentle isometric exercises and stretching. Requiring two person moderate assist to stand from edge of bed and unable to weight shift to pivot or take steps. SpO2 89-91% on RA. Notified MD regarding knee.    Follow Up Recommendations  CIR;Supervision/Assistance - 24 hour     Equipment Recommendations  Wheelchair (measurements PT);Wheelchair cushion (measurements PT)    Recommendations for Other Services       Precautions / Restrictions Precautions Precautions: Fall Precaution Comments: monitor SpO2; L  JP drain Restrictions Weight Bearing Restrictions: No    Mobility  Bed Mobility Overal bed mobility: Needs Assistance Bed Mobility: Rolling;Sidelying to Sit;Sit to Sidelying Rolling: Min assist Sidelying to sit: Mod assist;+2 for physical assistance;HOB elevated     Sit to sidelying: Mod assist;+2 for physical assistance General bed mobility comments: Log roll and pt initiating well with use of rails. Use of bed pad to scoot hips forward, assist for RLE    Transfers Overall transfer level: Needs assistance Equipment used: Rolling walker (2 wheeled) Transfers: Sit to/from Stand Sit to  Stand: +2 physical assistance;Mod assist         General transfer comment: modA +2 for power up; pt standing momentarily before requiring seated rest break due to increased pain in RLE at knee.  Ambulation/Gait                 Stairs             Wheelchair Mobility    Modified Rankin (Stroke Patients Only)       Balance Overall balance assessment: Needs assistance Sitting-balance support: Bilateral upper extremity supported;Feet supported Sitting balance-Leahy Scale: Fair Sitting balance - Comments: increased pain; minguardA for safety   Standing balance support: Bilateral upper extremity supported;During functional activity Standing balance-Leahy Scale: Poor Standing balance comment: Reliant on UE support on RW and physical assistance; standing momentarily.                            Cognition Arousal/Alertness: Awake/alert Behavior During Therapy: WFL for tasks assessed/performed Overall Cognitive Status: Within Functional Limits for tasks assessed                                 General Comments: Pt with clear cognition and able to follow all commands. Pt without pain meds prior to session so pt was alert and not drowsy.      Exercises General Exercises - Lower Extremity Ankle Circles/Pumps: Both;20 reps;Supine Quad Sets: Both;10 reps;Supine Other Exercises Other Exercises: Supine: self bilateral gastroc stretch x 1 min each    General Comments General comments (  skin integrity, edema, etc.): O2 88-91% on RA; Pt requires1 L O2 with mobility and exercise.      Pertinent Vitals/Pain Pain Assessment: 0-10 Pain Score: 9  Pain Location: R knee Pain Descriptors / Indicators: Discomfort;Grimacing;Guarding;Sharp Pain Intervention(s): Monitored during session;Limited activity within patient's tolerance;Patient requesting pain meds-RN notified    Home Living                      Prior Function            PT Goals  (current goals can now be found in the care plan section) Acute Rehab PT Goals Patient Stated Goal: spouse wants pt to try for CIR Potential to Achieve Goals: Good Progress towards PT goals: Progressing toward goals    Frequency    Min 3X/week      PT Plan Current plan remains appropriate    Co-evaluation PT/OT/SLP Co-Evaluation/Treatment: Yes Reason for Co-Treatment: For patient/therapist safety;To address functional/ADL transfers PT goals addressed during session: Mobility/safety with mobility OT goals addressed during session: Strengthening/ROM      AM-PAC PT "6 Clicks" Mobility   Outcome Measure  Help needed turning from your back to your side while in a flat bed without using bedrails?: A Little Help needed moving from lying on your back to sitting on the side of a flat bed without using bedrails?: A Lot Help needed moving to and from a bed to a chair (including a wheelchair)?: Total Help needed standing up from a chair using your arms (e.g., wheelchair or bedside chair)?: Total Help needed to walk in hospital room?: Total Help needed climbing 3-5 steps with a railing? : Total 6 Click Score: 9    End of Session Equipment Utilized During Treatment: Gait belt;Oxygen Activity Tolerance: Patient limited by pain Patient left: in bed;with call bell/phone within reach;with bed alarm set Nurse Communication: Mobility status PT Visit Diagnosis: Muscle weakness (generalized) (M62.81);Other abnormalities of gait and mobility (R26.89);Unsteadiness on feet (R26.81);Difficulty in walking, not elsewhere classified (R26.2);Pain     Time: 1610-9604 PT Time Calculation (min) (ACUTE ONLY): 29 min  Charges:  $Therapeutic Activity: 8-22 mins                     [name], PT, DPT Acute Rehabilitation Services Pager [phone] Office [phone]    [name] 01/28/2021, 10:41 AM

## 2021-01-28 NOTE — Progress Notes (Signed)
Nutrition Follow-up  DOCUMENTATION CODES:  Morbid obesity  INTERVENTION:  Continue Ensure Enlive TID.  Continue Magic Cup TID.  Add Vital Cuisine Shake daily, each supplement provides 520 kcal and 22 grams of protein.  Continue MVI with minerals daily.  NUTRITION DIAGNOSIS:  Increased nutrient needs related to acute illness (sepsis) as evidenced by estimated needs. - ongoing  GOAL:  Patient will meet greater than or equal to 90% of their needs - not meeting  MONITOR:  PO intake, Supplement acceptance, Labs, Weight trends, Skin, I & O's  REASON FOR ASSESSMENT:  Consult Assessment of nutrition requirement/status, Wound healing  ASSESSMENT:  70 yo male with a PMH of recent discharge from the hospital 01/10/2021 for sepsis secondary to moderate-sized abscess near the pelvic hardware during this time neurosurgery IR and ID were involved in his care he underwent wound debridement on 01/13/2021 status post back surgery insertion of hardware, E. coli grew from wound, was treated with IV cefazolin and the plan was to continue 6 weeks of IV antibiotics via PICC line.  Went home on 01/16/2021 started complaining of rib pain he has gained 10 pounds since hospital discharge.  Since discharge she has been taking diclofenac twice a day for knee pain.  He started having fevers, transfer from [facility] with sepsis hypoxic which improved with 4 L of oxygen. 7/21 - PICC line placement 8/2 - L closed system drain placed (270 ml output)  Per Epic, pt with limited intake, mostly 0-50% with one outlier meal intake of 90% for dinner last night. Pt ate breakfast potatoes and a blueberry muffin this morning. Pt is enjoying supplements. His appetite remains limited.  Admit wt: 131.5 kg Current wt: 121.2 kg Pt lost weight during admission. This may have been a shift in fluid due to edema in feet at admission.  Recommend continuing current nutrition plan but adding Hormel shake once daily at dinner  time for additional calories and protein.  Medications: reviewed; colace BID, EE BID, MVI with minerals, IVF @ 75 ml/hr, Ancef TID via IV, Zofran PRN (given once today), oxycodone PRN (given once today)  Labs: reviewed; Na 130, CBG 121-138  Diet Order:   Diet Order             Diet Heart Room service appropriate? Yes; Fluid consistency: Thin  Diet effective now                  EDUCATION NEEDS:  Education needs have been addressed  Skin:  Skin Assessment: Skin Integrity Issues: Skin Integrity Issues:: Incisions Incisions: Back, closed  Last BM:  01/27/21 - Type 6, small  Height:  Ht Readings from Last 1 Encounters:  01/13/21 5\' 7"  (1.702 m)   Weight:  Wt Readings from Last 1 Encounters:  01/27/21 121.2 kg   BMI:  Body mass index is 41.85 kg/m.  Estimated Nutritional Needs:  Kcal:  2100-2300 Protein:  115-130 grams Fluid:  >2 L  03/29/21, RD, LDN (she/her/hers) Registered Dietitian I After-Hours/Weekend Pager # [phone]

## 2021-01-29 DIAGNOSIS — A419 Sepsis, unspecified organism: Secondary | ICD-10-CM | POA: Diagnosis not present

## 2021-01-29 LAB — CBC WITH DIFFERENTIAL/PLATELET
Abs Immature Granulocytes: 0.19 10*3/uL — ABNORMAL HIGH (ref 0.00–0.07)
Basophils Absolute: 0.1 10*3/uL (ref 0.0–0.1)
Basophils Relative: 0 %
Eosinophils Absolute: 0.2 10*3/uL (ref 0.0–0.5)
Eosinophils Relative: 1 %
HCT: 31.8 % — ABNORMAL LOW (ref 39.0–52.0)
Hemoglobin: 10.1 g/dL — ABNORMAL LOW (ref 13.0–17.0)
Immature Granulocytes: 1 %
Lymphocytes Relative: 3 %
Lymphs Abs: 0.5 10*3/uL — ABNORMAL LOW (ref 0.7–4.0)
MCH: 28.1 pg (ref 26.0–34.0)
MCHC: 31.8 g/dL (ref 30.0–36.0)
MCV: 88.3 fL (ref 80.0–100.0)
Monocytes Absolute: 1.5 10*3/uL — ABNORMAL HIGH (ref 0.1–1.0)
Monocytes Relative: 9 %
Neutro Abs: 14.9 10*3/uL — ABNORMAL HIGH (ref 1.7–7.7)
Neutrophils Relative %: 86 %
Platelets: 415 10*3/uL — ABNORMAL HIGH (ref 150–400)
RBC: 3.6 MIL/uL — ABNORMAL LOW (ref 4.22–5.81)
RDW: 13.3 % (ref 11.5–15.5)
WBC: 17.3 10*3/uL — ABNORMAL HIGH (ref 4.0–10.5)
nRBC: 0 % (ref 0.0–0.2)

## 2021-01-29 LAB — COMPREHENSIVE METABOLIC PANEL
ALT: 62 U/L — ABNORMAL HIGH (ref 0–44)
AST: 129 U/L — ABNORMAL HIGH (ref 15–41)
Albumin: 1.6 g/dL — ABNORMAL LOW (ref 3.5–5.0)
Alkaline Phosphatase: 127 U/L — ABNORMAL HIGH (ref 38–126)
Anion gap: 11 (ref 5–15)
BUN: 21 mg/dL (ref 8–23)
CO2: 33 mmol/L — ABNORMAL HIGH (ref 22–32)
Calcium: 8.1 mg/dL — ABNORMAL LOW (ref 8.9–10.3)
Chloride: 85 mmol/L — ABNORMAL LOW (ref 98–111)
Creatinine, Ser: 0.98 mg/dL (ref 0.61–1.24)
GFR, Estimated: >60 mL/min (ref >60)
Glucose, Bld: 204 mg/dL — ABNORMAL HIGH (ref 70–99)
Potassium: 3.9 mmol/L (ref 3.5–5.1)
Sodium: 129 mmol/L — ABNORMAL LOW (ref 135–145)
Total Bilirubin: 0.5 mg/dL (ref 0.3–1.2)
Total Protein: 5.4 g/dL — ABNORMAL LOW (ref 6.5–8.1)

## 2021-01-29 LAB — SYNOVIAL CELL COUNT + DIFF, W/ CRYSTALS
Eosinophils-Synovial: 0 % (ref 0–1)
Lymphocytes-Synovial Fld: 0 % (ref 0–20)
Monocyte-Macrophage-Synovial Fluid: 2 % — ABNORMAL LOW (ref 50–90)
Neutrophil, Synovial: 98 % — ABNORMAL HIGH (ref 0–25)
WBC, Synovial: 25000 /mm3 — ABNORMAL HIGH (ref 0–200)

## 2021-01-29 LAB — OSMOLALITY, URINE: Osmolality, Ur: 441 mOsm/kg (ref 300–900)

## 2021-01-29 LAB — SODIUM, URINE, RANDOM: Sodium, Ur: 95 mmol/L

## 2021-01-29 MED ORDER — COLCHICINE 0.6 MG PO TABS
0.6000 mg | ORAL_TABLET | Freq: Every day | ORAL | Status: DC
  Administered 2021-01-29 – 2021-02-03 (×6): 0.6 mg via ORAL
  Filled 2021-01-29 (×6): qty 1

## 2021-01-29 NOTE — Procedures (Signed)
Procedure: Right knee aspiration and injection   Indication: Right knee effusion(s)   Surgeon: Charma Igo, PA-C   Assist: None   Anesthesia: None   EBL: None   Complications: None   Findings: After risks/benefits explained patient desires to undergo procedure. Consent obtained and time out performed. The right knee was sterilely prepped and aspirated. 46ml pale yellow fluid obtained. 17ml 0.5% Marcaine instilled. Pt tolerated the procedure well.       Freeman Caldron, PA-C Orthopedic Surgery [phone]

## 2021-01-29 NOTE — Progress Notes (Signed)
PROGRESS NOTE   Marco Kennedy  PPI:[id number] DOB: Jan 16, 1951 DOA: 01/20/2021 PCP: Maximiano Coss, MD  Brief Narrative:  70 year old home dwelling male Obesity BMI 41, OSA on BiPAP Index back surgery 04/18/2020 multistage procedure ALIFL4-L5, L5-L1 and then T10 to pelvis pedicle screw fixation with osteotomies and posterior lateral arthrodesis + autograft + allograft Recent admission 7/16 through 01/16/2021 with sudden onset flank pain + nausea vomiting generalized weakness subjective fever--CT lumbar spine = moderate size abscess located near spinal hardware underwent wound debridement 01/13/2021 and found to have E. coli-ID saw the patient patient was de-escalated to cefazolin for 6-week treatment with PICC line to be placed  Patient was discharged home but over the course of the 22nd through the 26 had gained 18 pounds weight associated with early satiety with ongoing chills and inability to lay flat because of pain in his back In [place] ED where he was first evaluated he was tachycardic tachypneic hypoxic to 88% requiring 4 L oxygen CT showed presumed empyema of the lungs and there was concern for discitis osteomyelitis of T10 with a WBC in the 19 range Ancef vancomycin were started Neurosurgery consulted, pulmonary consulted Neurosurgery did not initially feel any need for operative management  Eventually thought to have empyema in addition to psoas collection causing sepsis Developed urinary retention inability to notice he was passing stool-MRI back ordered   7/26 bilateral pigtail catheters performed 7/28 left chest tube removed 7/28 TTE no endocarditis-cardiology consulted for TEE 7/29 high-grade fevers despite antibiotics blood fluid cultures  7/30 right chest tube pulled 8/1 CT chest abdomen pelvis improving effusions but also showed 10 cm retroperitoneal collection 8/2 IR guided right peritoneal JP drain placement yielding 8/3 decreased sensation lower extremities,  incontinence urine inability to sense when passing stool, also new onset right knee effusion-orthopedics + neurosurgery consulted--MRI knee, MRI T/L-spine ordered  Hospital-Problem based course Prior E. coli bacteremia/  Psoas abscess drained 8/2 Blood cultures 7/26, 7/29 and 8/1 all negative--TTE 7/28 negative for vegetation psoas cultures from JP drain 8/2 no growth to date Empirically remains Ancef/ID-saline lock IV fluid and periodic labs Tylenol first choice oxycodone second choice IV morphine 4 mg every 2 as needed third choice for pain ?  Cauda equina,?   Neurosurgery perspective T10 mass-effect on MRI 8/3 is that this is not infectious and no operative management needed Continue antibiotics per ID Right knee swelling DDx septic arthritis/gout Appreciate right knee sampling by Ortho 8/3-f fluid analysis more inflammatory not infectious and consistent with possible gout versus pseudogout therefore start colchicine Hold any type of steroids at this juncture-follow culture for completion sake  Parapneumonic effusions?  Empyema Pulmonology recommends deferring further pleural procedures at this time Desat screen to be obtaine=as does not use oxygen during the daytime at home Anasarca third spacing Probable hypervolemic hyponatremia contributing Serum osmolality = 279 2/2 poor nutrition-urine osmolality 441 urine sodium 95  -fluid restrict 1200 cc Hold diuretics but saline lock and see if improves with water restriction Transaminitis etiology unclear Prior LFTs performed 7/26 were relatively normal AST to ALT 2:1 ratio today--note that patient is on Ancef which can cause this-we will discuss implications of this with ID OSA on CPAP, morbid obesity BMI 42 Life-threatening in the setting of critical illness  DVT prophylaxis: Lovenox Code Status: Full Family Communication: No family present at the bedside today Disposition:  Status is: Inpatient  Remains inpatient appropriate  because:Hemodynamically unstable, Ongoing diagnostic testing needed not appropriate for outpatient work up, and Unsafe d/c  plan  Dispo: The patient is from: Home              Anticipated d/c is to: CIR              Patient currently is not medically stable to d/c.   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 He tells me he has not had a stool today-he is able to tell intermittently today that he is passing urine  Objective: Vitals:   01/28/21 2332 01/29/21 0330 01/29/21 0500 01/29/21 0729  BP: 125/61 135/62  (!) 151/62  Pulse: 76 85  86  Resp: 20 (!) 21  (!) 24  Temp: 99 F (37.2 C) 98.8 F (37.1 C)  99 F (37.2 C)  TempSrc: Oral Oral  Oral  SpO2: 96% 96%  95%  Weight:   120.3 kg     Intake/Output Summary (Last 24 hours) at 01/29/2021 1914 Last data filed at 01/29/2021 7829 Gross per 24 hour  Intake 1857.5 ml  Output 1055 ml  Net 802.5 ml    Filed Weights   01/25/21 0221 01/27/21 0412 01/29/21 0500  Weight: 130.2 kg 121.2 kg 120.3 kg    Examination:  Coherent pleasant no distress thick neck Mallampati 4-off oxygen he does not desaturate S1-S2 cannot appreciate murmur Abdomen obese-does have a drain in lower quadrant as per prior Chest is relatively clear Right knee still quite enlarged Patient has cold sensation in all lumbar dermatomes and fine touch sensation in the same-power is restricted on the right side because of his knee effusion but otherwise he seems improved compared to prior exam  Data Reviewed: personally reviewed   Sodium 129 chloride 85 CO2 33 AST/ALT 129/62 White count 17 hemoglobin  10  Radiology Studies: MR THORACIC SPINE W WO CONTRAST  Result Date: 01/28/2021 CLINICAL DATA:  Discitis-osteomyelitis EXAM: MRI THORACIC AND LUMBAR SPINE WITHOUT AND WITH CONTRAST TECHNIQUE: Multiplanar and multiecho pulse sequences of the thoracic and lumbar spine were obtained without and with intravenous contrast. CONTRAST:  37mL GADAVIST GADOBUTROL 1 MMOL/ML IV SOLN COMPARISON:  01/11/2021 FINDINGS: MRI THORACIC SPINE FINDINGS Alignment:  Physiologic. Vertebrae: Spinal fusion hardware begins at T10. More superior levels are normal. Cord: Normal signal and morphology. There is a ventral epidural collection that begins at the level of the T7-8 disc space and extends inferiorly to T10-11, unchanged in craniocaudal extent. AP thickness measures approximately 5 mm. Paraspinal and other soft tissues: Loculated bilateral pleural collections. Small amount of fluid adjacent to the left T12 spinal rod. The fluid at the right spinal rod has substantially resolved. Disc levels: There is abnormal disc signal at T9-10 and T10-11. The T10-11 finding is unchanged but the T9-10 abnormality is new. Circumferential dural thickening and enhancement at the T10 level effaces the thecal sac. This is poorly visualized on the prior study on multiple sequences, but appears to be unchanged based on the axial T2-weighted imaging. MRI LUMBAR SPINE FINDINGS Segmentation:  Standard Alignment:  Normal Vertebrae: Posterior fusion hardware extends inferiorly to the sacrum. There are interbody spacers at L4-5 and L5-S1. Conus medullaris: Extends to the L1-2 level and appears normal. Paraspinal and  other soft tissues: Negative. Disc levels: Degenerative findings are unchanged since 01/11/2021 L1-2: Normal disc.  No stenosis. L2-3: Disc desiccation without spinal canal stenosis. L3-4: Posterior decompression no spinal canal stenosis. L4-5: Posterior decompression.  Mild left foraminal stenosis. L5-S1: No disc herniation.  Mild left foraminal  stenosis. IMPRESSION: 1. 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 Circumferential dural thickening at the T10 level again effaces the thecal sac with mass effect on the spinal cord. 2. New abnormal disc signal at T9-10 and unchanged abnormal signal T10-11, consistent with discitis-osteomyelitis. 3. No lumbar spinal canal stenosis. 4. Loculated bilateral pleural collections. Electronically Signed   By: Deatra Robinson M.D.   On: 01/28/2021 23:33   MR Lumbar Spine W Wo Contrast  Result Date: 01/28/2021 CLINICAL DATA:  Discitis-osteomyelitis EXAM: MRI THORACIC AND LUMBAR SPINE WITHOUT AND WITH CONTRAST TECHNIQUE: Multiplanar and multiecho pulse sequences of the thoracic and lumbar spine were obtained without and with intravenous contrast. CONTRAST:  69mL GADAVIST GADOBUTROL 1 MMOL/ML IV SOLN COMPARISON:  01/11/2021 FINDINGS: MRI THORACIC SPINE FINDINGS Alignment:  Physiologic. Vertebrae: Spinal fusion hardware begins at T10. More superior levels are normal. Cord: Normal signal and morphology. There is a ventral epidural collection that begins at the level of the T7-8 disc space and extends inferiorly to T10-11, unchanged in craniocaudal extent. AP thickness measures approximately 5 mm. Paraspinal and other soft tissues: Loculated bilateral pleural collections. Small amount of fluid adjacent to the left T12 spinal rod. The fluid at the right spinal rod has substantially resolved. Disc levels: There is abnormal disc signal at T9-10 and T10-11. The T10-11 finding is unchanged but the T9-10 abnormality is new. Circumferential dural thickening and enhancement at the T10 level effaces the thecal sac. This is poorly visualized on the prior study on multiple sequences, but appears to be unchanged based on the axial T2-weighted imaging. MRI LUMBAR SPINE FINDINGS Segmentation:  Standard Alignment:  Normal Vertebrae: Posterior fusion hardware extends  inferiorly to the sacrum. There are interbody spacers at L4-5 and L5-S1. Conus medullaris: Extends to the L1-2 level and appears normal. Paraspinal and other soft tissues: Negative. Disc levels: Degenerative findings are unchanged since 01/11/2021 L1-2: Normal disc.  No stenosis. L2-3: Disc desiccation without spinal canal stenosis. L3-4: Posterior decompression no spinal canal stenosis. L4-5: Posterior decompression.  Mild left foraminal stenosis. L5-S1: No disc herniation.  Mild left foraminal stenosis. IMPRESSION: 1. 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 Circumferential dural thickening at the T10 level again effaces the thecal sac with mass effect on the spinal cord. 2. New abnormal disc signal at T9-10 and unchanged abnormal signal T10-11, consistent with discitis-osteomyelitis. 3. No lumbar spinal canal stenosis. 4. Loculated bilateral pleural collections. Electronically Signed   By: Deatra Robinson M.D.   On: 01/28/2021 23:33   [place] COMPLETE JOINT SPACE STRUCTURES LOW RIGHT  Result Date: 01/28/2021 CLINICAL DATA:  Swelling assess for knee effusion EXAM: ULTRASOUND right LOWER EXTREMITY LIMITED TECHNIQUE: Ultrasound examination of the lower extremity soft tissues was performed in the area of clinical concern. COMPARISON:  None. FINDINGS: Ultrasound evaluation of the right knee performed in the region of redness and swelling. Diffuse subcutaneous edema. Large complex fluid collection superior to the patella measuring 8.8 x 2.7 x 2.8 cm with estimated volume of 28 mL. IMPRESSION: Subcutaneous edema with large complex fluid collection at the anterior knee superior to the patella which may reflect  infected or inflammatory fluid collection, or potentially hematoma. This would be better characterized by MRI. Electronically Signed   By: Jasmine PangKim  Fujinaga M.D.   On: 01/28/2021 17:12   CT IMAGE GUIDED DRAINAGE BY PERCUTANEOUS CATHETER  Result  Date: 01/27/2021 Roanna BanningMugweru, Jon, MD     01/27/2021  2:06 PM [facility] Inter[facility]iology Procedure Note Patient: Unk LightningKenneth E Burkland DOB: Jan 20, 1951 Medical Record Number: [id number] Note Date/Time: 01/27/21 2:03 PM Performing Physician: Roanna BanningJon Mugweru, MD Assistant(s): None Diagnosis: Left Retroperitoneal Collection Procedure: DRAINAGE CATHETER PLACEMENT OF LEFT RETROPERITONEAL COLLECTION Anesthesia: Conscious Sedation Complications: None Estimated Blood Loss:  <5 mL Specimens: Sent for Gram Stain, Aerobe Culture, Anerobe Culture, and Body Fluid Creatinine Findings: Successful CT-guided placement of 12 F catheter into Left Retroperitoneal Collection. Plan: - Record drain output every 12 hours. - Follow up drain evaluation / sinogram in 2 week(s). See detailed procedure note with images in PACS. The patient tolerated the procedure well without incident or complication and was returned to Floor Bed in stable condition. Roanna BanningJon Mugweru, MD [facility] Pager. [phone] Clinic. [phone]     Scheduled Meds:  bupivacaine  10 mL Infiltration Once   Chlorhexidine Gluconate Cloth  6 each Topical Daily   docusate sodium  100 mg Oral BID   enoxaparin (LOVENOX) injection  40 mg Subcutaneous Daily   feeding supplement  237 mL Oral TID BM   multivitamin with minerals  1 tablet Oral Daily   sodium chloride flush  10-40 mL Intracatheter Q12H   sodium chloride flush  3 mL Intravenous Q12H   Continuous Infusions:  sodium chloride 75 mL/hr at 01/28/21 2342    ceFAZolin (ANCEF) IV 2 g (01/29/21 0518)     LOS: 9 days   Time spent: 744  Rhetta MuraJai-Gurmukh Lacresha Fusilier, MD [facility] To contact the attending provider between 7A-7P or the covering provider during after hours 7P-7A, please log into the web site [email or web address] and access using universal [place] password for that web site. If you do not have the password, please call the hospital  operator.  01/29/2021, 8:24 AM

## 2021-01-29 NOTE — Progress Notes (Signed)
Inpatient Rehab Admissions Coordinator:   Continue to follow for possible CIR admission at the completion of medical workup.  Will need to send new therapy notes to insurance at that time.    Estill Dooms, PT, DPT Admissions Coordinator [phone] 01/29/21  10:43 AM

## 2021-01-29 NOTE — Progress Notes (Addendum)
Referring Physician(s): Manandhar,S  Supervising Physician: Mir, Biochemist, clinical  Patient Status:  [facility] - In-pt  Chief Complaint:  Left retroperitoneal fluid collection S/p drain placement with IR on 8/2  Subjective: Pt doing ok today ; had rt knee asp earlier this am; denies worsening left flank pain,N/V   Allergies: Patient has no known allergies.  Medications: Prior to Admission medications   Medication Sig Start Date End Date Taking? Authorizing Provider  ceFAZolin (ANCEF) IVPB Inject 2 g into the vein every 8 (eight) hours. Indication:  vertebral osteomyelitis First Dose: No Last Day of Therapy:  02/24/2021 Labs - Once weekly:  CBC/D and BMP, Labs - Every other week:  ESR and CRP Method of administration: IV Push Method of administration may be changed at the discretion of home infusion pharmacist based upon assessment of the patient and/or caregiver's ability to self-administer the medication ordered. 01/15/21 02/24/21  Marvis Moeller, NP  cholecalciferol (VITAMIN D3) 25 MCG (1000 UNIT) tablet Take 1,000 Units by mouth daily.    [provider]  diclofenac (VOLTAREN) 75 MG EC tablet Take 75 mg by mouth 2 (two) times daily. 11/13/20   [provider]  Multiple Vitamin (MULTIVITAMIN WITH MINERALS) TABS tablet Take 1 tablet by mouth daily.    [provider]  oxyCODONE (OXY IR/ROXICODONE) 5 MG immediate release tablet Take 1 tablet (5 mg total) by mouth every 6 (six) hours as needed for moderate pain. 01/15/21   Little Ishikawa, MD  polyethylene glycol (MIRALAX / GLYCOLAX) 17 g packet Take 17 g by mouth 2 (two) times daily. 01/15/21   Little Ishikawa, MD     Vital Signs: BP (!) 144/72 (BP Location: Left Arm)   Pulse 85   Temp 99.7 F (37.6 C) (Oral)   Resp (!) 22   Wt 265 lb 3.4 oz (120.3 kg)   SpO2 90%   BMI 41.54 kg/m   Physical Exam awake/alert; left RP drain intact, insertion site ok, not sig tender, OP 250 cc yesterday, 50 cc  today yellow fluid; drain flushed with minimal return  Imaging: MR THORACIC SPINE W WO CONTRAST  Result Date: 01/28/2021 CLINICAL DATA:  Discitis-osteomyelitis EXAM: MRI THORACIC AND LUMBAR SPINE WITHOUT AND WITH CONTRAST TECHNIQUE: Multiplanar and multiecho pulse sequences of the thoracic and lumbar spine were obtained without and with intravenous contrast. CONTRAST:  37m GADAVIST GADOBUTROL 1 MMOL/ML IV SOLN COMPARISON:  01/11/2021 FINDINGS: MRI THORACIC SPINE FINDINGS Alignment:  Physiologic. Vertebrae: Spinal fusion hardware begins at T10. More superior levels are normal. Cord: Normal signal and morphology. There is a ventral epidural collection that begins at the level of the T7-8 disc space and extends inferiorly to T10-11, unchanged in craniocaudal extent. AP thickness measures approximately 5 mm. Paraspinal and other soft tissues: Loculated bilateral pleural collections. Small amount of fluid adjacent to the left T12 spinal rod. The fluid at the right spinal rod has substantially resolved. Disc levels: There is abnormal disc signal at T9-10 and T10-11. The T10-11 finding is unchanged but the T9-10 abnormality is new. Circumferential dural thickening and enhancement at the T10 level effaces the thecal sac. This is poorly visualized on the prior study on multiple sequences, but appears to be unchanged based on the axial T2-weighted imaging. MRI LUMBAR SPINE FINDINGS Segmentation:  Standard Alignment:  Normal Vertebrae: Posterior fusion hardware extends inferiorly to the sacrum. There are interbody spacers at L4-5 and L5-S1. Conus medullaris: Extends to the L1-2 level and appears normal. Paraspinal and other soft tissues:  Negative. Disc levels: Degenerative findings are unchanged since 01/11/2021 L1-2: Normal disc.  No stenosis. L2-3: Disc desiccation without spinal canal stenosis. L3-4: Posterior decompression no spinal canal stenosis. L4-5: Posterior decompression.  Mild left foraminal stenosis. L5-S1:  No disc herniation.  Mild left foraminal stenosis. IMPRESSION: 1. 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 Circumferential dural thickening at the T10 level again effaces the thecal sac with mass effect on the spinal cord. 2. New abnormal disc signal at T9-10 and unchanged abnormal signal T10-11, consistent with discitis-osteomyelitis. 3. No lumbar spinal canal stenosis. 4. Loculated bilateral pleural collections. Electronically Signed   By: Ulyses Jarred M.D.   On: 01/28/2021 23:33   MR Lumbar Spine W Wo Contrast  Result Date: 01/28/2021 CLINICAL DATA:  Discitis-osteomyelitis EXAM: MRI THORACIC AND LUMBAR SPINE WITHOUT AND WITH CONTRAST TECHNIQUE: Multiplanar and multiecho pulse sequences of the thoracic and lumbar spine were obtained without and with intravenous contrast. CONTRAST:  42m GADAVIST GADOBUTROL 1 MMOL/ML IV SOLN COMPARISON:  01/11/2021 FINDINGS: MRI THORACIC SPINE FINDINGS Alignment:  Physiologic. Vertebrae: Spinal fusion hardware begins at T10. More superior levels are normal. Cord: Normal signal and morphology. There is a ventral epidural collection that begins at the level of the T7-8 disc space and extends inferiorly to T10-11, unchanged in craniocaudal extent. AP thickness measures approximately 5 mm. Paraspinal and other soft tissues: Loculated bilateral pleural collections. Small amount of fluid adjacent to the left T12 spinal rod. The fluid at the right spinal rod has substantially resolved. Disc levels: There is abnormal disc signal at T9-10 and T10-11. The T10-11 finding is unchanged but the T9-10 abnormality is new. Circumferential dural thickening and enhancement at the T10 level effaces the thecal sac. This is poorly visualized on the prior study on multiple sequences, but appears to be unchanged based on the axial T2-weighted imaging. MRI LUMBAR SPINE FINDINGS Segmentation:  Standard Alignment:  Normal  Vertebrae: Posterior fusion hardware extends inferiorly to the sacrum. There are interbody spacers at L4-5 and L5-S1. Conus medullaris: Extends to the L1-2 level and appears normal. Paraspinal and other soft tissues: Negative. Disc levels: Degenerative findings are unchanged since 01/11/2021 L1-2: Normal disc.  No stenosis. L2-3: Disc desiccation without spinal canal stenosis. L3-4: Posterior decompression no spinal canal stenosis. L4-5: Posterior decompression.  Mild left foraminal stenosis. L5-S1: No disc herniation.  Mild left foraminal stenosis. IMPRESSION: 1. 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 Circumferential dural thickening at the T10 level again effaces the thecal sac with mass effect on the spinal cord. 2. New abnormal disc signal at T9-10 and unchanged abnormal signal T10-11, consistent with discitis-osteomyelitis. 3. No lumbar spinal canal stenosis. 4. Loculated bilateral pleural collections. Electronically Signed   By: KUlyses JarredM.D.   On: 01/28/2021 23:33   CT CHEST ABDOMEN PELVIS W CONTRAST  Result Date: 01/26/2021 CLINICAL DATA:  Empyema, disseminated E coli infection question intra-abdominal abscess, sepsis secondary to discitis and osteomyelitis, E coli in an abscess near pelvic hardware, back pain EXAM: CT CHEST, ABDOMEN, AND PELVIS WITH CONTRAST TECHNIQUE: Multidetector CT imaging of the chest, abdomen and pelvis was performed following the standard protocol during bolus administration of intravenous contrast. Sagittal and coronal MPR images reconstructed from axial data set. CONTRAST:  1069mOMNIPAQUE IOHEXOL 300 MG/ML SOLN IV. No oral contrast. COMPARISON:  CT abdomen and pelvis 01/10/2021 FINDINGS: CT CHEST FINDINGS Cardiovascular: Minimal atherosclerotic calcification aorta without  aneurysm. Heart unremarkable. No pericardial effusion. Pulmonary arteries grossly patent on nondedicated exam.  Mediastinum/Nodes: No thoracic adenopathy. RIGHT arm PICC line with tip in SVC. Base of cervical region normal appearance. Esophagus unremarkable. Lungs/Pleura: Small loculated pleural fluid collections bilaterally RIGHT greater than LEFT which contain foci of air, could either represent empyema or prior instrumentation/aspiration. Compressive atelectasis in the lower lobes bilaterally. Loculated fluid versus less likely mass at medial RIGHT apex, recommend attention on follow-up imaging, 2.3 x 2.1 cm image 10, fluid attenuation. No infiltrate or additional mass identified. Musculoskeletal: Spinal fixation hardware T10-S2. Thoracic portion of hardware appears intact. Lucency seen surrounding pedicle screws at T10 adjacent to loculated pleural collection at the inferior RIGHT chest medially favor infected hardware. CT ABDOMEN PELVIS FINDINGS Hepatobiliary: Gallbladder and liver normal appearance Pancreas: Normal appearance Spleen: Normal appearance Adrenals/Urinary Tract: Adrenal glands, kidneys, ureters, and bladder normal appearance Stomach/Bowel: Normal appendix. Stomach and bowel loops normal appearance. Vascular/Lymphatic: Atherosclerotic calcifications aorta and iliac arteries without aneurysm. Normal sized retroperitoneal nodes. Reproductive: Unremarkable seminal vesicles and prostate gland Other: No free air or free fluid. Large fluid collection again identified LEFT retroperitoneal 10.1 x 6.4 x 7.1 cm, could represent infected or sterile collection. Trace free pelvic fluid. Scattered stranding of subcutaneous tissue planes. Musculoskeletal: Orthopedic hardware T10-S2. LEFT hip prosthesis. Probable bone island RIGHT femoral neck. IMPRESSION: Small loculated pleural fluid collections bilaterally RIGHT greater than LEFT which contain foci of air, could either represent empyema or prior instrumentation/aspiration, slightly increased. Compressive atelectasis in the lower lobes. Loculated fluid versus less likely  mass at medial RIGHT apex 2.3 x 2.1 cm; recommend attention on follow-up imaging. Large LEFT retroperitoneal fluid collection 10.1 x 6.4 x 7.1 cm little changed, could represent infected or sterile collection. Lucency surrounding the pedicle screws bilaterally at T10 favoring infection, question slightly increased on RIGHT since previous study. Aortic Atherosclerosis (ICD10-I70.0). Electronically Signed   By: Lavonia Dana M.D.   On: 01/26/2021 11:40   [place] COMPLETE JOINT SPACE STRUCTURES LOW RIGHT  Result Date: 01/28/2021 CLINICAL DATA:  Swelling assess for knee effusion EXAM: ULTRASOUND right LOWER EXTREMITY LIMITED TECHNIQUE: Ultrasound examination of the lower extremity soft tissues was performed in the area of clinical concern. COMPARISON:  None. FINDINGS: Ultrasound evaluation of the right knee performed in the region of redness and swelling. Diffuse subcutaneous edema. Large complex fluid collection superior to the patella measuring 8.8 x 2.7 x 2.8 cm with estimated volume of 28 mL. IMPRESSION: Subcutaneous edema with large complex fluid collection at the anterior knee superior to the patella which may reflect infected or inflammatory fluid collection, or potentially hematoma. This would be better characterized by MRI. Electronically Signed   By: Donavan Foil M.D.   On: 01/28/2021 17:12   CT IMAGE GUIDED DRAINAGE BY PERCUTANEOUS CATHETER  Result Date: 01/28/2021 INDICATION: 70 year old male status post back surgery with left retroperitoneal collection. EXAM: CT IMAGE GUIDED DRAINAGE BY PERCUTANEOUS CATHETER COMPARISON:  CT chest abdomen pelvis, 01/26/2021 MEDICATIONS: The patient is currently admitted to the hospital and receiving intravenous antibiotics. The antibiotics were administered within an appropriate time frame prior to the initiation of the procedure. ANESTHESIA/SEDATION: Moderate (conscious) sedation was employed during this procedure. A total of Versed 2 mg and Fentanyl 100 mcg was  administered intravenously. Moderate Sedation Time: 23 minutes. The patient's level of consciousness and vital signs were monitored continuously by radiology nursing throughout the procedure under my direct supervision. CONTRAST:  None COMPLICATIONS: None immediate. PROCEDURE: Informed written consent was obtained from the patient  after a discussion of the risks, benefits and alternatives to treatment. The patient was placed supine on the CT gantry and a pre procedural CT was performed re-demonstrating the known abscess/fluid collection within the left retroperitoneum. The procedure was planned. A timeout was performed prior to the initiation of the procedure. The left lower quadrant and flank was prepped and draped in the usual sterile fashion. The overlying soft tissues were anesthetized with 1% lidocaine with epinephrine. Appropriate trajectory was planned with the use of a 22 gauge spinal needle. An 18 gauge trocar needle was advanced into the abscess/fluid collection and a short Amplatz super stiff wire was coiled within the collection. Appropriate positioning was confirmed with a limited CT scan. The tract was serially dilated allowing placement of a 12 [place] all-purpose drainage catheter. Appropriate positioning was confirmed with a limited postprocedural CT scan. Forty ml of urine-colored fluid was aspirated. The tube was connected to a drainage bag and sutured in place. A dressing was placed. The patient tolerated the procedure well without immediate post procedural complication. IMPRESSION: Successful CT guided placement of a 45 French all purpose drain catheter into the left retroperitoneum with aspiration of 40 mL of urine-colored fluid. Samples were sent to the laboratory as requested by the ordering clinical team. Michaelle Birks, MD [facility]iology Electronically Signed   By: Michaelle Birks MD   On: 01/28/2021 16:17    Labs:  CBC: Recent Labs     01/21/21 0025 01/24/21 0253 01/28/21 0511 01/29/21 0331  WBC 23.2* 16.5* 17.5* 17.3*  HGB 9.7* 9.9* 8.9* 10.1*  HCT 30.3* 30.2* 28.7* 31.8*  PLT 475* 380 362 415*    COAGS: No results for input(s): INR, APTT in the last 8760 hours.  BMP: Recent Labs    01/24/21 0253 01/26/21 1432 01/28/21 0511 01/29/21 0331  NA 129* 127* 130* 129*  K 4.6 3.6 3.7 3.9  CL 92* 85* 86* 85*  CO2 29 30 34* 33*  GLUCOSE 184* 261* 195* 204*  BUN 19 18 22 21   CALCIUM 7.6* 7.8* 7.6* 8.1*  CREATININE 0.81 1.08 1.01 0.98  GFRNONAA >60 >60 >60 >60    LIVER FUNCTION TESTS: Recent Labs    01/14/21 0256 01/15/21 0600 01/20/21 2039 01/29/21 0331  BILITOT 1.4* 1.3* 0.5 0.5  AST 137* 66* 37 129*  ALT 98* 70* 13 62*  ALKPHOS 87 81 92 127*  PROT 4.9* 4.8* 5.1* 5.4*  ALBUMIN 1.9* 1.9* 1.7* 1.6*    Assessment and Plan: 70 yo male with history of lumbar spinal stenosis s/p lumbar fusions on multiple level on 04/18/2020.  History of abscess development near existing hardware s/p wound debridement by neurosurgery on 01/13/2021.  Currently admitted due to chest pain and anasarca, CT showed bilateral pleural effusion s/p chest tube placement by CCM and subsequently removed.  Repeat CT CAP on 01/26/2021 showed large left retroperitoneal fluid collection, patient is s/p aspiration and drain placement with IR on 01/27/2021. afebrile; OP 250 cc yesterday, 50 cc today yellow  fluid; creat level pend; cx neg to date;WBC 17.3(17.5), HGB 10.1, CREAT NL; Continue with flushing TID, output recording q shift and dressing changes as needed. Would consider additional imaging when output is less than 10 ml for 24 hours not including flush material   Electronically Signed: [name], PA-C 01/29/2021, 11:41 AM   I spent a total of 15 Minutes at the the patient's bedside AND on the patient's hospital floor or unit, greater than 50% of  which was counseling/coordinating care for left retroperitoneal fluid collection  drain    [name], male   DOB: May 14, 1951, 70 y.o.   MRN: [id number]

## 2021-01-29 NOTE — Plan of Care (Signed)

## 2021-01-29 NOTE — Progress Notes (Signed)
Pt stated he didn't want to wear his home CPAP for the night. RN present, pt asked to have RN call RT if he changed his mind and RT needed to come assist him.

## 2021-01-29 NOTE — Progress Notes (Addendum)
Subjective: Patient reports a significant amount of right knee pain. He stated he has minimal back and chest pain at this time.   Objective: Vital signs in last 24 hours: Temp:  [98.8 F (37.1 C)-101.1 F (38.4 C)] 99 F (37.2 C) (08/04 0729) Pulse Rate:  [76-90] 86 (08/04 0729) Resp:  [14-24] 15 (08/04 0858) BP: (125-154)/(61-78) 151/62 (08/04 0729) SpO2:  [93 %-96 %] 96 % (08/04 0858) Weight:  [120.3 kg] 120.3 kg (08/04 0500)  Intake/Output from previous day: 08/03 0701 - 08/04 0700 In: 1857.5 [P.O.:1440; I.V.:317.5; IV Piggyback:100] Out: 1055 [Urine:800; Drains:255] Intake/Output this shift: No intake/output data recorded.  Physical Exam: Patient is awake, A/O X 4, conversant, and in good spirits. He is in NAD, laying comfortably in his bed with stable vital signs. Speech is fluent and appropriate. MAEW with good strength, however movement of his lower extremities causes a significant amount of discomfort to the patient. Sensation to light touch is intact. PERLA, EOMI. CNs grossly intact. Dressing is clean dry intact. Incision is well approximated with no drainage, erythema, or edema.   Lab Results: Recent Labs    01/28/21 0511 01/29/21 0331  WBC 17.5* 17.3*  HGB 8.9* 10.1*  HCT 28.7* 31.8*  PLT 362 415*   BMET Recent Labs    01/28/21 0511 01/29/21 0331  NA 130* 129*  K 3.7 3.9  CL 86* 85*  CO2 34* 33*  GLUCOSE 195* 204*  BUN 22 21  CREATININE 1.01 0.98  CALCIUM 7.6* 8.1*    Studies/Results: MR THORACIC SPINE W WO CONTRAST  Result Date: 01/28/2021 CLINICAL DATA:  Discitis-osteomyelitis EXAM: MRI THORACIC AND LUMBAR SPINE WITHOUT AND WITH CONTRAST TECHNIQUE: Multiplanar and multiecho pulse sequences of the thoracic and lumbar spine were obtained without and with intravenous contrast. CONTRAST:  66mL GADAVIST GADOBUTROL 1 MMOL/ML IV SOLN COMPARISON:  01/11/2021 FINDINGS: MRI THORACIC SPINE FINDINGS Alignment:  Physiologic. Vertebrae: Spinal fusion hardware begins  at T10. More superior levels are normal. Cord: Normal signal and morphology. There is a ventral epidural collection that begins at the level of the T7-8 disc space and extends inferiorly to T10-11, unchanged in craniocaudal extent. AP thickness measures approximately 5 mm. Paraspinal and other soft tissues: Loculated bilateral pleural collections. Small amount of fluid adjacent to the left T12 spinal rod. The fluid at the right spinal rod has substantially resolved. Disc levels: There is abnormal disc signal at T9-10 and T10-11. The T10-11 finding is unchanged but the T9-10 abnormality is new. Circumferential dural thickening and enhancement at the T10 level effaces the thecal sac. This is poorly visualized on the prior study on multiple sequences, but appears to be unchanged based on the axial T2-weighted imaging. MRI LUMBAR SPINE FINDINGS Segmentation:  Standard Alignment:  Normal Vertebrae: Posterior fusion hardware extends inferiorly to the sacrum. There are interbody spacers at L4-5 and L5-S1. Conus medullaris: Extends to the L1-2 level and appears normal. Paraspinal and other soft tissues: Negative. Disc levels: Degenerative findings are unchanged since 01/11/2021 L1-2: Normal disc.  No stenosis. L2-3: Disc desiccation without spinal canal stenosis. L3-4: Posterior decompression no spinal canal stenosis. L4-5: Posterior decompression.  Mild left foraminal stenosis. L5-S1: No disc herniation.  Mild left foraminal stenosis. IMPRESSION: 1. 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 Circumferential dural thickening at the T10 level again effaces the thecal sac with mass effect on the spinal cord. 2. New abnormal disc signal at T9-10 and unchanged abnormal signal T10-11, consistent  with discitis-osteomyelitis. 3. No lumbar spinal canal stenosis. 4. Loculated bilateral pleural collections. Electronically Signed   By: Deatra Robinson M.D.   On:  01/28/2021 23:33   MR Lumbar Spine W Wo Contrast  Result Date: 01/28/2021 CLINICAL DATA:  Discitis-osteomyelitis EXAM: MRI THORACIC AND LUMBAR SPINE WITHOUT AND WITH CONTRAST TECHNIQUE: Multiplanar and multiecho pulse sequences of the thoracic and lumbar spine were obtained without and with intravenous contrast. CONTRAST:  110mL GADAVIST GADOBUTROL 1 MMOL/ML IV SOLN COMPARISON:  01/11/2021 FINDINGS: MRI THORACIC SPINE FINDINGS Alignment:  Physiologic. Vertebrae: Spinal fusion hardware begins at T10. More superior levels are normal. Cord: Normal signal and morphology. There is a ventral epidural collection that begins at the level of the T7-8 disc space and extends inferiorly to T10-11, unchanged in craniocaudal extent. AP thickness measures approximately 5 mm. Paraspinal and other soft tissues: Loculated bilateral pleural collections. Small amount of fluid adjacent to the left T12 spinal rod. The fluid at the right spinal rod has substantially resolved. Disc levels: There is abnormal disc signal at T9-10 and T10-11. The T10-11 finding is unchanged but the T9-10 abnormality is new. Circumferential dural thickening and enhancement at the T10 level effaces the thecal sac. This is poorly visualized on the prior study on multiple sequences, but appears to be unchanged based on the axial T2-weighted imaging. MRI LUMBAR SPINE FINDINGS Segmentation:  Standard Alignment:  Normal Vertebrae: Posterior fusion hardware extends inferiorly to the sacrum. There are interbody spacers at L4-5 and L5-S1. Conus medullaris: Extends to the L1-2 level and appears normal. Paraspinal and other soft tissues: Negative. Disc levels: Degenerative findings are unchanged since 01/11/2021 L1-2: Normal disc.  No stenosis. L2-3: Disc desiccation without spinal canal stenosis. L3-4: Posterior decompression no spinal canal stenosis. L4-5: Posterior decompression.  Mild left foraminal stenosis. L5-S1: No disc herniation.  Mild left foraminal  stenosis. IMPRESSION: 1. 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 Circumferential dural thickening at the T10 level again effaces the thecal sac with mass effect on the spinal cord. 2. New abnormal disc signal at T9-10 and unchanged abnormal signal T10-11, consistent with discitis-osteomyelitis. 3. No lumbar spinal canal stenosis. 4. Loculated bilateral pleural collections. Electronically Signed   By: Deatra Robinson M.D.   On: 01/28/2021 23:33   [place] COMPLETE JOINT SPACE STRUCTURES LOW RIGHT  Result Date: 01/28/2021 CLINICAL DATA:  Swelling assess for knee effusion EXAM: ULTRASOUND right LOWER EXTREMITY LIMITED TECHNIQUE: Ultrasound examination of the lower extremity soft tissues was performed in the area of clinical concern. COMPARISON:  None. FINDINGS: Ultrasound evaluation of the right knee performed in the region of redness and swelling. Diffuse subcutaneous edema. Large complex fluid collection superior to the patella measuring 8.8 x 2.7 x 2.8 cm with estimated volume of 28 mL. IMPRESSION: Subcutaneous edema with large complex fluid collection at the anterior knee superior to the patella which may reflect infected or inflammatory fluid collection, or potentially hematoma. This would be better characterized by MRI. Electronically Signed   By: Jasmine Pang M.D.   On: 01/28/2021 17:12   CT IMAGE GUIDED DRAINAGE BY PERCUTANEOUS CATHETER  Result Date: 01/28/2021 INDICATION: 70 year old male status post back surgery with left retroperitoneal collection. EXAM: CT IMAGE GUIDED DRAINAGE BY PERCUTANEOUS CATHETER COMPARISON:  CT chest abdomen pelvis, 01/26/2021 MEDICATIONS: The patient is currently admitted to the hospital and receiving intravenous antibiotics. The antibiotics were administered within an appropriate time frame prior to the initiation of the procedure. ANESTHESIA/SEDATION: Moderate (conscious)  sedation was employed during this  procedure. A total of Versed 2 mg and Fentanyl 100 mcg was administered intravenously. Moderate Sedation Time: 23 minutes. The patient's level of consciousness and vital signs were monitored continuously by radiology nursing throughout the procedure under my direct supervision. CONTRAST:  None COMPLICATIONS: None immediate. PROCEDURE: Informed written consent was obtained from the patient after a discussion of the risks, benefits and alternatives to treatment. The patient was placed supine on the CT gantry and a pre procedural CT was performed re-demonstrating the known abscess/fluid collection within the left retroperitoneum. The procedure was planned. A timeout was performed prior to the initiation of the procedure. The left lower quadrant and flank was prepped and draped in the usual sterile fashion. The overlying soft tissues were anesthetized with 1% lidocaine with epinephrine. Appropriate trajectory was planned with the use of a 22 gauge spinal needle. An 18 gauge trocar needle was advanced into the abscess/fluid collection and a short Amplatz super stiff wire was coiled within the collection. Appropriate positioning was confirmed with a limited CT scan. The tract was serially dilated allowing placement of a 12 [place] all-purpose drainage catheter. Appropriate positioning was confirmed with a limited postprocedural CT scan. Forty ml of urine-colored fluid was aspirated. The tube was connected to a drainage bag and sutured in place. A dressing was placed. The patient tolerated the procedure well without immediate post procedural complication. IMPRESSION: Successful CT guided placement of a 75 French all purpose drain catheter into the left retroperitoneum with aspiration of 40 mL of urine-colored fluid. Samples were sent to the laboratory as requested by the ordering clinical team. Roanna Banning, MD [facility] Radiology Electronically Signed   By: Roanna Banning MD    On: 01/28/2021 16:17    Assessment/Plan: 70 y.o. male who is s/p L4/5 and L5/S1 ALIF with left-sided L2/3 and L3/4 XLIF with T10 through pelvis fixation in 11/21, and thoracolumbar wound debridement on 01/13/21. ID was consulted for assistance in antibiotic management. He was subsequently discharged on 01/16/21 with PICC line and IV antibiotics. While at home, he had progressively worsening pleuritic chest pain and shortness of breath.  He ultimately went to the ED in [place] and was transferred back to [facility] for further evaluation and management.  CT scan of the chest showed an empyema along with discitis/osteomyelitis at T10.  His WBCs were 19,000. Bilateral chest tubes were placed on 01/20/2021 by CCM.  The effusions were drained and the antibiotics were continued. The left and right pleural tubes were removed on 01/23/2021 and 01/24/2021 respectively.     Patent does not complain of worsening back pain.  No new weakness numbness or tingling in the legs. He has significant pain in his right knee with effusion, ortho on board and plan for aspiration of right knee today. He does report some recent urinary incontinence. Patient remains neurologically at baseline. 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 I would recommend continued medical management of the patient and no neurosurgical intervention at this time. Continue IV antibiotic per ID. Continue medical management.   LOS: 9 days     Council Mechanic, DNP, NP-C 01/29/2021, 9:33 AM    I have reviewed findings and MRI with patient and his wife.  Continue with current therapies.  No need for additional surgery at this point.

## 2021-01-29 NOTE — Progress Notes (Signed)
ID Brief Note   MRI T L spine 01/28/21 IMPRESSION: 1. 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 Circumferential dural thickening at the T10 level again effaces the thecal sac with mass effect on the spinal cord. 2. New abnormal disc signal at T9-10 and unchanged abnormal signal T10-11, consistent with discitis-osteomyelitis. 3. No lumbar spinal canal stenosis. 4. Loculated bilateral pleural collections.   Recommendations Would recommend to discuss MRI findings with [facility] MRI Rt knee is pending  [facility] and [facility] recs Continue cefazolin  [employer]   Odette Fraction, MD Infectious Disease Physician [facility] [address] [place], [place] [address] Phone: [phone]  Fax: [phone]

## 2021-01-29 NOTE — Progress Notes (Signed)
Pt wants to wait on going on their home system for bedtime. RN made aware. RT will cont to monitor. Pt is currently sat 95% on 2L [place]

## 2021-01-29 NOTE — Progress Notes (Signed)
Full note to follow-alerted Dr. Jake Samples neurosurgery to findings on MRI back and he will follow-up with the patient's primary neurosurgeon Dr. Venetia Maxon for input-appreciate assistance

## 2021-01-30 ENCOUNTER — Inpatient Hospital Stay (HOSPITAL_COMMUNITY): Payer: BC Managed Care – PPO

## 2021-01-30 DIAGNOSIS — A419 Sepsis, unspecified organism: Secondary | ICD-10-CM | POA: Diagnosis not present

## 2021-01-30 LAB — COMPREHENSIVE METABOLIC PANEL
ALT: 45 U/L — ABNORMAL HIGH (ref 0–44)
AST: 103 U/L — ABNORMAL HIGH (ref 15–41)
Albumin: 1.5 g/dL — ABNORMAL LOW (ref 3.5–5.0)
Alkaline Phosphatase: 116 U/L (ref 38–126)
Anion gap: 7 (ref 5–15)
BUN: 17 mg/dL (ref 8–23)
CO2: 34 mmol/L — ABNORMAL HIGH (ref 22–32)
Calcium: 7.7 mg/dL — ABNORMAL LOW (ref 8.9–10.3)
Chloride: 91 mmol/L — ABNORMAL LOW (ref 98–111)
Creatinine, Ser: 0.79 mg/dL (ref 0.61–1.24)
GFR, Estimated: >60 mL/min (ref >60)
Glucose, Bld: 134 mg/dL — ABNORMAL HIGH (ref 70–99)
Potassium: 3.9 mmol/L (ref 3.5–5.1)
Sodium: 132 mmol/L — ABNORMAL LOW (ref 135–145)
Total Bilirubin: 0.9 mg/dL (ref 0.3–1.2)
Total Protein: 5.1 g/dL — ABNORMAL LOW (ref 6.5–8.1)

## 2021-01-30 LAB — CBC WITH DIFFERENTIAL/PLATELET
Abs Immature Granulocytes: 0.15 10*3/uL — ABNORMAL HIGH (ref 0.00–0.07)
Basophils Absolute: 0.1 10*3/uL (ref 0.0–0.1)
Basophils Relative: 0 %
Eosinophils Absolute: 0.1 10*3/uL (ref 0.0–0.5)
Eosinophils Relative: 0 %
HCT: 28.4 % — ABNORMAL LOW (ref 39.0–52.0)
Hemoglobin: 8.8 g/dL — ABNORMAL LOW (ref 13.0–17.0)
Immature Granulocytes: 1 %
Lymphocytes Relative: 5 %
Lymphs Abs: 0.8 10*3/uL (ref 0.7–4.0)
MCH: 27.6 pg (ref 26.0–34.0)
MCHC: 31 g/dL (ref 30.0–36.0)
MCV: 89 fL (ref 80.0–100.0)
Monocytes Absolute: 1.2 10*3/uL — ABNORMAL HIGH (ref 0.1–1.0)
Monocytes Relative: 8 %
Neutro Abs: 14 10*3/uL — ABNORMAL HIGH (ref 1.7–7.7)
Neutrophils Relative %: 86 %
Platelets: 428 10*3/uL — ABNORMAL HIGH (ref 150–400)
RBC: 3.19 MIL/uL — ABNORMAL LOW (ref 4.22–5.81)
RDW: 13.2 % (ref 11.5–15.5)
WBC: 16.3 10*3/uL — ABNORMAL HIGH (ref 4.0–10.5)
nRBC: 0 % (ref 0.0–0.2)

## 2021-01-30 LAB — HEPATITIS PANEL, ACUTE
HCV Ab: NONREACTIVE
Hep A IgM: NONREACTIVE
Hep B C IgM: NONREACTIVE
Hepatitis B Surface Ag: NONREACTIVE

## 2021-01-30 IMAGING — MR MR KNEE*R* WO/W CM
4 of 9 series · 18 of 40 positions shown · IV contrast (gadavist)
Comparison: None.

CLINICAL DATA: Right knee pain for 3 days.

EXAM:
MRI OF THE RIGHT KNEE WITHOUT AND WITH CONTRAST
TECHNIQUE: Multiplanar, multisequence MR imaging of the knee was performed
before and after the administration of intravenous contrast.
CONTRAST:  10mL GADAVIST GADOBUTROL 1 MMOL/ML IV SOLN

[Series 3: T2 fat-sat · axial · 4.0mm · 0.35mm/px · z∈[-43,+131]mm · 4 of 36 slices shown (1 of 2)]
[im 1/36]
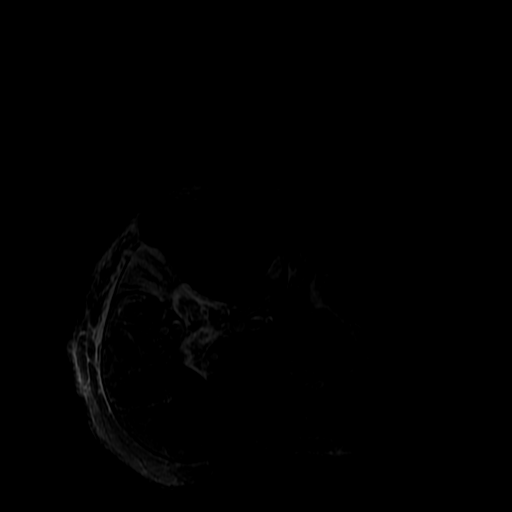
[im 12/36]
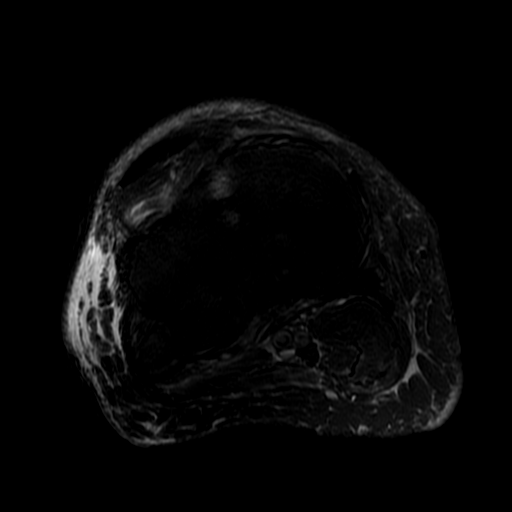
[im 24/36]
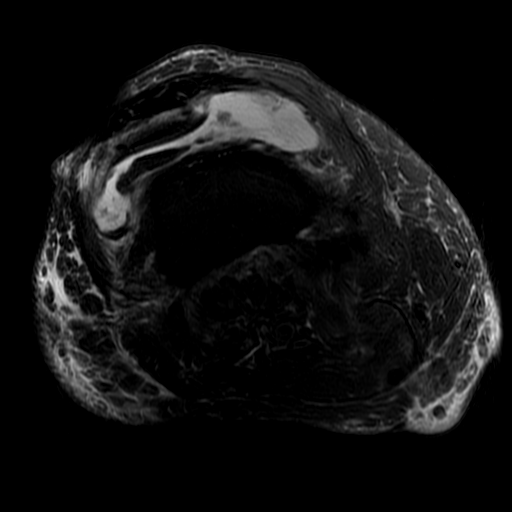
[im 36/36]
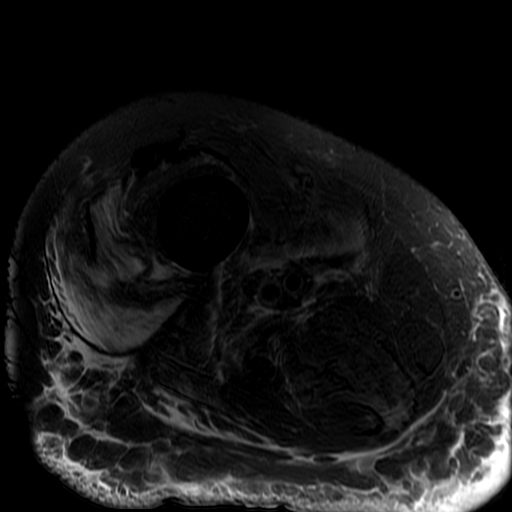

[Series 7: T2 fat-sat · coronal · 4.0mm · 0.35mm/px · 4 of 30 slices shown (2 of 2)]
[im 1/30]
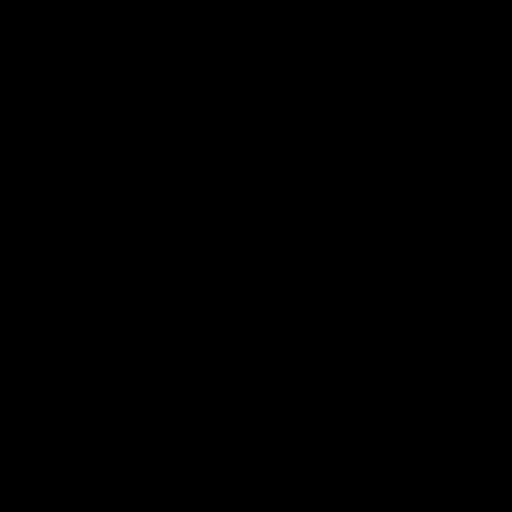
[im 10/30]
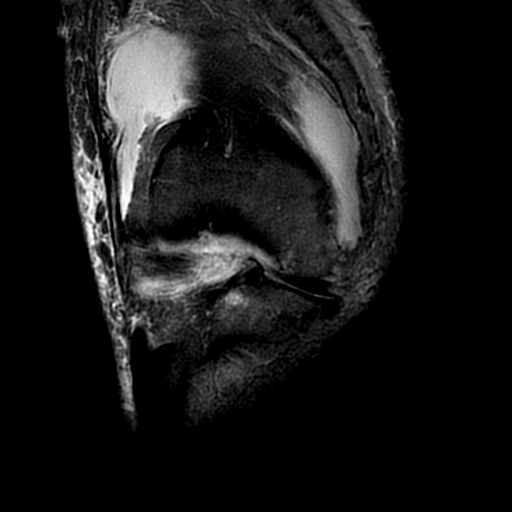
[im 20/30]
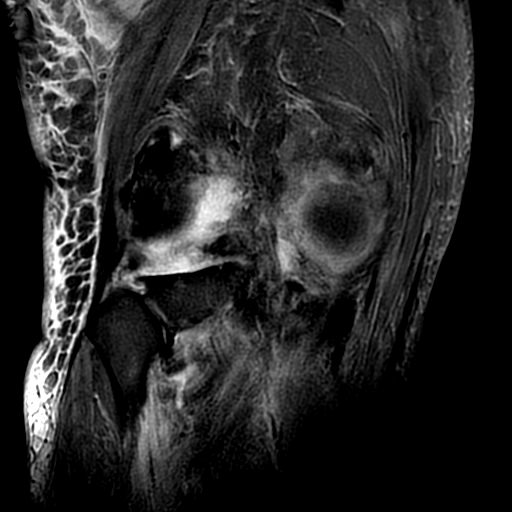
[im 30/30]
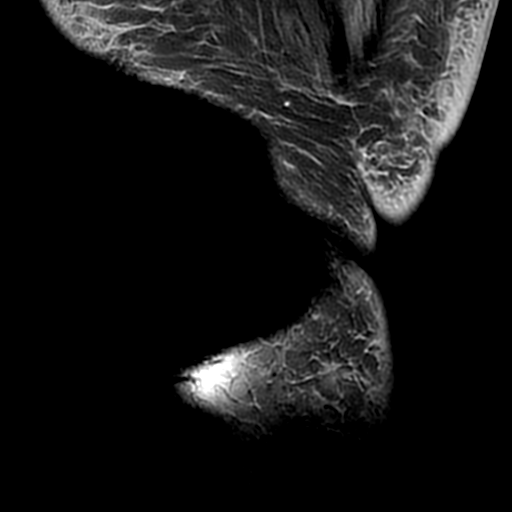

[Series 8: PD fat-sat · coronal · 3.0mm · 0.35mm/px · 5 of 38 slices shown (1 of 2)]
[im 1/38]
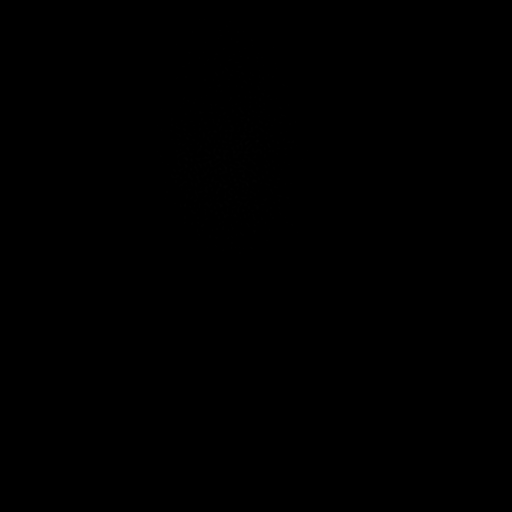
[im 10/38]
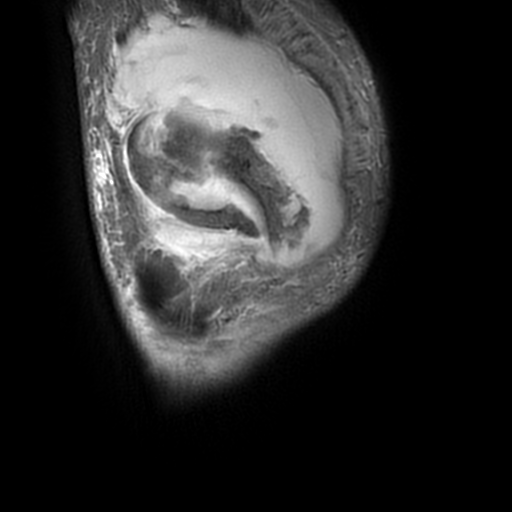
[im 19/38]
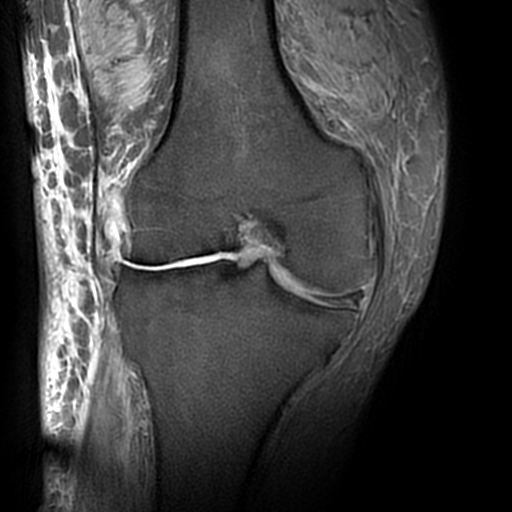
[im 28/38]
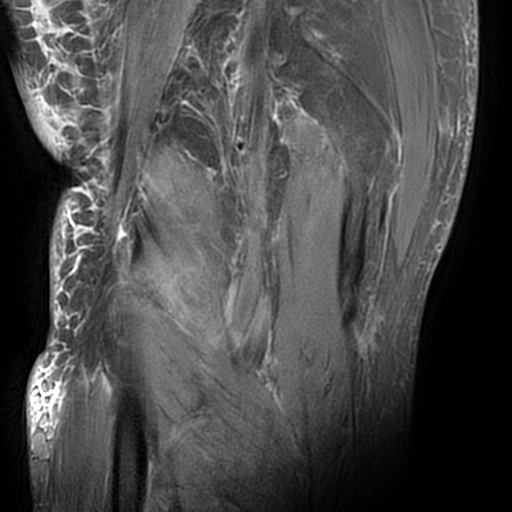
[im 38/38]
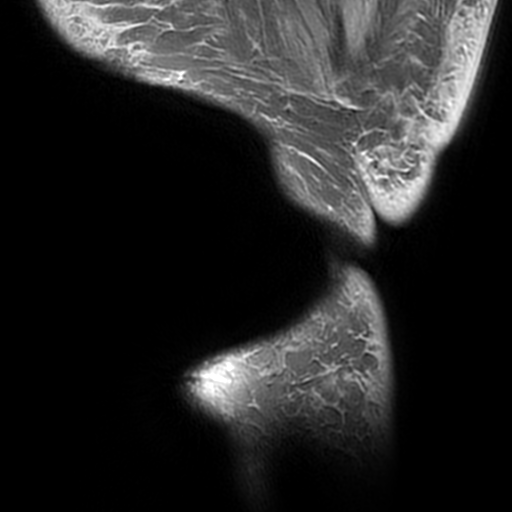

[Series 9: PD fat-sat · sagittal · 3.0mm · 0.35mm/px · 5 of 39 slices shown (2 of 2)]
[im 1/39]
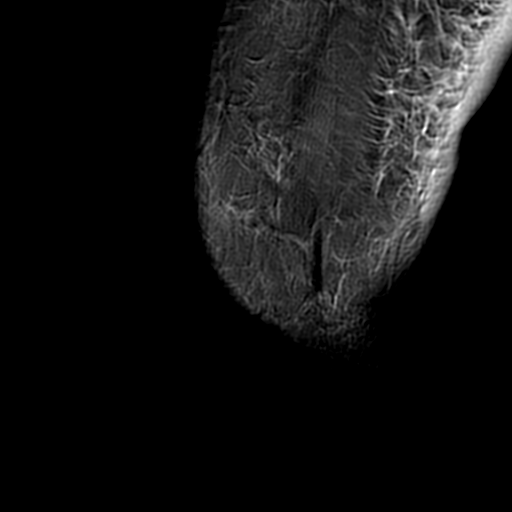
[im 10/39]
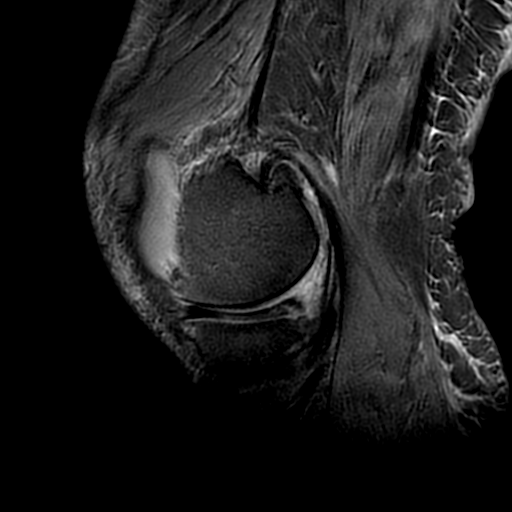
[im 20/39]
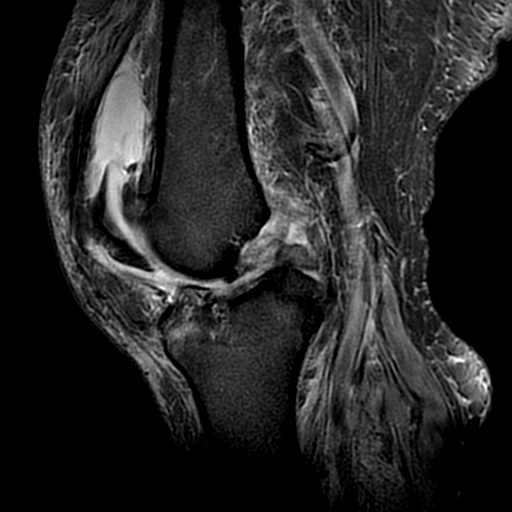
[im 29/39]
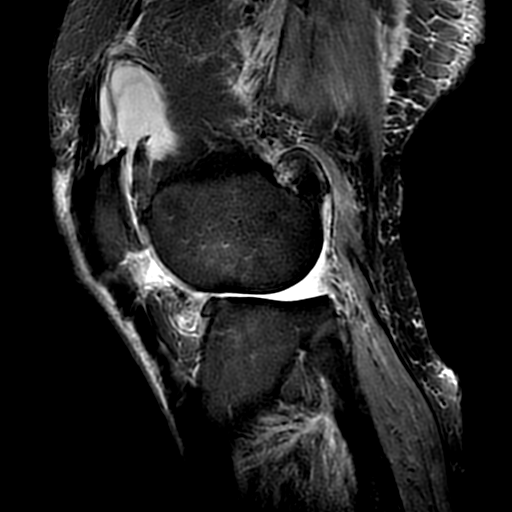
[im 39/39]
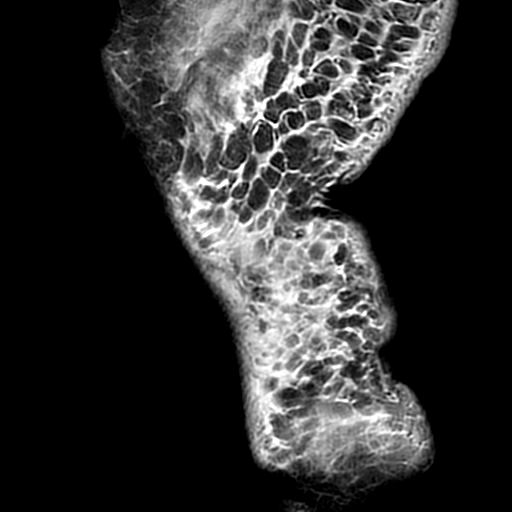

[18 of 40 positions shown; findings below may reference images not displayed]

FINDINGS: Exam limited by patient motion.

MENISCI

Medial meniscus:  Degenerative changes without obvious tear.

Lateral meniscus:  Probable chronic tear or prior meniscectomy.

LIGAMENTS

Cruciates:  Chronic ACL deficient knee.  PCL is intact.

Collaterals:  Intact

CARTILAGE

Patellofemoral: Advanced degenerative chondrosis with joint space
narrowing and spurring.

Medial:  Moderate degenerative chondrosis.

Lateral: Advanced degenerative chondrosis with full-thickness
cartilage loss, joint space narrowing and spurring.

Joint: There is a moderate to large joint effusion with moderate
irregular surrounding synovial enhancement. Certainly could not
exclude septic arthritis but this could also be non infectious
synovitis. Recommend knee joint aspiration for further evaluation.

Popliteal Fossa:  No popliteal mass or Baker's cyst.

Extensor Mechanism: The patella retinacular structures are intact
and the quadriceps and patellar tendons are intact.

Bones: No findings suspicious for osteomyelitis. No marrow edema or
abnormal enhancement.

Other: Subcutaneous soft tissue swelling/edema could suggest
cellulitis. There is also moderate edema like signal changes in the
vastus medialis muscle and also in the posterior compartment
muscles. Findings could suggest infectious myositis. No findings
suspicious for pyomyositis.
IMPRESSION: 1. Exam limited by patient motion.
2. Moderate to large joint effusion with moderate irregular
surrounding synovial enhancement. Certainly could not exclude septic
arthritis but this could also be non infectious synovitis. Recommend
knee joint aspiration for further evaluation.
3. No findings suspicious for osteomyelitis.
4. Probable cellulitis and myositis without discrete soft tissue
abscess or pyomyositis.
5. Chronic ACL deficient knee.
6. Tricompartmental degenerative changes most significant in the
lateral.

## 2021-01-30 MED ORDER — GADOBUTROL 1 MMOL/ML IV SOLN
10.0000 mL | Freq: Once | INTRAVENOUS | Status: AC | PRN
  Administered 2021-01-30: 10 mL via INTRAVENOUS

## 2021-01-30 NOTE — Progress Notes (Signed)
Physical Therapy Treatment Patient Details Name: Marco Kennedy MRN: [id number] DOB: August 25, 1950 Today's Date: 01/30/2021    History of Present Illness Pt is a 70 y.o. male who presented 01/20/21 with SOB, increased weight gain, back pain, and fever. Of note, recent admission from 7/16-22 for sepsis secondary to moderate-sized abscess near the pelvic hardware. CT of the chest show empyema as well as discitis osteomyelitis of T10. S/p bil chest tube placement 7/26, L chest tube removed 7/29. L JP drain insertion 8/2. S/p Right knee aspiration and injection 8/4. PMH: back surgery with pelvis fixation in 03/2020, OSA on BIPAP, arthritis.    PT Comments    Pt has made great progress as he was able to ambulate up to ~10 ft with a RW, minA, and a chair follow today before fatiguing. His R knee pain continues to limit his ability to weight bear, but appears to have improved to allow him to perform transfers to stand with modAx2 and ambualate with minA. He continues to display lower extremity weakness, more so on his L, thus performed some exercises to address it. Will continue to follow acutely. Current recommendations remain appropriate.   Follow Up Recommendations  CIR;Supervision/Assistance - 24 hour     Equipment Recommendations  Wheelchair (measurements PT);Wheelchair cushion (measurements PT)    Recommendations for Other Services       Precautions / Restrictions Precautions Precautions: Fall Precaution Comments: monitor SpO2; L JP drain Restrictions Weight Bearing Restrictions: No    Mobility  Bed Mobility Overal bed mobility: Needs Assistance Bed Mobility: Rolling;Sidelying to Sit Rolling: Min guard Sidelying to sit: HOB elevated;Min assist       General bed mobility comments: Log roll and pt initiating well with use of rails. HOB elevated to assist pt to push up with R UE to ascend trunk to sit up R EOB, minA to complete.    Transfers Overall transfer level: Needs  assistance Equipment used: Rolling walker (2 wheeled) Transfers: Sit to/from [employer] Sit to Stand: +2 physical assistance;Mod assist Stand pivot transfers: Min assist;+2 safety/equipment       General transfer comment: Sit to stand 1x from elevated EOB and 1x from recliner, pt initiating scoot to edge to prepare, modAx2 and extra time to power up to stand, extend hips, and transition hands to RW. MinA, +2 for safety, and extra time to take side steps to transfer bed > recliner towards L.  Ambulation/Gait Ambulation/Gait assistance: Min assist;+2 safety/equipment Gait Distance (Feet): 10 Feet (x2 bouts of ~2 ft > ~10 ft) Assistive device: Rolling walker (2 wheeled) Gait Pattern/deviations: Step-through pattern;Decreased stride length;Trunk flexed;Shuffle;Decreased stance time - right Gait velocity: reduced Gait velocity interpretation: <1.31 ft/sec, indicative of household ambulator General Gait Details: Pt with kyphotic posture, needing cues to extend hips and obtain upward gaze to improve posture. Decreased R stance time, likely due to R knee pain. Decreased gait speed, stride length, and bil feet clearance, minA for steadying, +2 for chair follow safety. No appreciative knee buckling.   Stairs             Wheelchair Mobility    Modified Rankin (Stroke Patients Only)       Balance Overall balance assessment: Needs assistance Sitting-balance support: Feet supported;No upper extremity supported Sitting balance-Leahy Scale: Fair Sitting balance - Comments: minguardA for safety   Standing balance support: Bilateral upper extremity supported;During functional activity Standing balance-Leahy Scale: Poor Standing balance comment: Reliant on UE support on RW and physical assistance.  Cognition Arousal/Alertness: Awake/alert Behavior During Therapy: WFL for tasks assessed/performed Overall Cognitive Status: Within  Functional Limits for tasks assessed                                 General Comments: Pt with clear cognition and able to follow all commands.      Exercises General Exercises - Lower Extremity Ankle Circles/Pumps: Both;10 reps;Seated;AROM Long Arc Quad: AROM;Both;10 reps;Seated Hip Flexion/Marching: AROM;Both;10 reps;Seated Other Exercises Other Exercises: PROM to bil ankles into dorsiflexion 3x    General Comments General comments (skin integrity, edema, etc.): SpO2 >/= 90% on RA with good wave forms, down to 86% with inconsistent wave forms noted; re-donned Gantt at 1L end of session to maintain SpO2 >/= 92%      Pertinent Vitals/Pain Pain Assessment: Faces Faces Pain Scale: Hurts even more Pain Location: R knee Pain Descriptors / Indicators: Discomfort;Grimacing;Guarding Pain Intervention(s): Monitored during session;Limited activity within patient's tolerance;Premedicated before session;Repositioned    Home Living                      Prior Function            PT Goals (current goals can now be found in the care plan section) Acute Rehab PT Goals Patient Stated Goal: to walk PT Goal Formulation: With patient Time For Goal Achievement: 02/06/21 Potential to Achieve Goals: Good Progress towards PT goals: Progressing toward goals    Frequency    Min 3X/week      PT Plan Current plan remains appropriate    Co-evaluation              AM-PAC PT "6 Clicks" Mobility   Outcome Measure  Help needed turning from your back to your side while in a flat bed without using bedrails?: A Little Help needed moving from lying on your back to sitting on the side of a flat bed without using bedrails?: A Little Help needed moving to and from a bed to a chair (including a wheelchair)?: A Little Help needed standing up from a chair using your arms (e.g., wheelchair or bedside chair)?: Total Help needed to walk in hospital room?: A Little Help needed  climbing 3-5 steps with a railing? : Total 6 Click Score: 14    End of Session Equipment Utilized During Treatment: Gait belt;Oxygen Activity Tolerance: Patient limited by pain Patient left: in bed;with call bell/phone within reach;with bed alarm set Nurse Communication: Mobility status PT Visit Diagnosis: Muscle weakness (generalized) (M62.81);Other abnormalities of gait and mobility (R26.89);Unsteadiness on feet (R26.81);Difficulty in walking, not elsewhere classified (R26.2);Pain Pain - Right/Left: Right Pain - part of body: Knee     Time: 9735-3299 PT Time Calculation (min) (ACUTE ONLY): 38 min  Charges:  $Gait Training: 8-22 mins $Therapeutic Exercise: 8-22 mins $Therapeutic Activity: 8-22 mins                     Raymond Gurney, PT, DPT Acute Rehabilitation Services  Pager: [phone] Office: [phone]    Jewel Baize 01/30/2021, 11:40 AM

## 2021-01-30 NOTE — Progress Notes (Addendum)
PROGRESS NOTE   Marco Kennedy  ZOX:[id number] DOB: [birth year] DOA: 01/20/2021 PCP: [name], [name], MD  Brief Narrative:  70 year old home dwelling male Obesity BMI 41, OSA on BiPAP Index back surgery 04/18/2020 multistage procedure ALIFL4-L5, L5-L1 and then T10 to pelvis pedicle screw fixation with osteotomies and posterior lateral arthrodesis + autograft + allograft Recent admission 7/16 through 01/16/2021 with sudden onset flank pain + nausea vomiting generalized weakness subjective fever--CT lumbar spine = moderate size abscess located near spinal hardware underwent wound debridement 01/13/2021 and found to have E. coli-ID saw the patient patient was de-escalated to cefazolin for 6-week treatment with PICC line to be placed  Patient was discharged home but over the course of the 22nd through the 26 had gained 18 pounds weight associated with early satiety with ongoing chills and inability to lay flat because of pain in his back In [place] [facility] where he was first evaluated he was tachycardic tachypneic hypoxic to 88% requiring 4 L oxygen CT showed presumed empyema of the lungs and there was concern for discitis osteomyelitis of T10 with a WBC in the 19 range Ancef vancomycin were started Neurosurgery consulted, pulmonary consulted Neurosurgery did not initially feel any need for operative management  Eventually thought to have empyema in addition to psoas collection causing sepsis Developed urinary retention inability to notice he was passing stool-MRI back ordered and this was felt to be more impingement rather than mass-effect from expanding abscess  7/26 bilateral pigtail catheters performed 7/28 left chest tube removed 7/28 TTE no endocarditis-cardiology consulted for TEE 7/29 high-grade fevers despite antibiotics blood fluid cultures  7/30 right chest tube pulled 8/1 CT chest abdomen pelvis improving effusions but also showed 10 cm retroperitoneal collection 8/2 IR guided right  peritoneal JP drain placement yielding 8/3 decreased sensation lower extremities, incontinence urine inability to sense when passing stool, also new onset right knee effusion-orthopedics + neurosurgery consulted--MRI knee, MRI T/L-spine ordered 8/5 MRI knee pending  Hospital-Problem based course Prior E. coli bacteremia/  Psoas abscess drained 8/2 Blood cultures 7/26, 7/29 and 8/1 all negative--TTE 7/28 negative for vegetation psoas cultures from JP drain 8/2 no growth to date Empirically remains Ancef/ID Tylenol first choice oxycodone second ch start to de-escalate pain meds in the next several days oice IV morphine 4 mg every 2 as needed third choice for pain--- Diagnosis cauda equina entertained but ruled out and felt secondary to mass-effect Neurosurgery perspective T10 mass-effect on MRI 8/3 is that this is not infectious and no operative management needed Duration of cefazolin per ID Right knee gout Appreciate right knee sampling by Ortho 8/3--- knee tap crystal arthropathy Continue colchicine 0.6 Hold any type of steroids at this juncture-follow culture for completion sake  Retroperitoneal collection status post JP drain placement 8/3 Drain being upgraded to gravity bag because of large amount of output-May need to be studied and/or imaged in the next several days if continues to have large output per IR discretion Parapneumonic effusions?  Empyema Pulmonology recommends deferring further pleural procedures at this time Desat screen to be obtained=as does not use oxygen during the daytime at home Anasarca third spacing Probable hypervolemic hyponatremia contributing Serum osmolality = 279 2/2 poor nutrition-urine osmolality 441 urine sodium 95  -fluid restrict 1200 cc Hold diuretics-improving with fluid restriction alone Transaminitis etiology unclear Prior LFTs performed 7/26 were relatively normal AST to ALT 2:1 ratio today--feel this was related to Ancef but LFTs seem to be  trending slightly down-follow up in a.m. with labs Hepatitis  serologies ordered are all negative  OSA on CPAP, morbid obesity BMI 42 Life-threatening in the setting of critical illness  DVT prophylaxis: Lovenox Code Status: Full Family Communication: No family present at the bedside today-I have discussed with them on several days previously last on 8/4 Disposition:  Status is: Inpatient  Remains inpatient appropriate because:Hemodynamically unstable, Ongoing diagnostic testing needed not appropriate for outpatient work up, and Unsafe d/c plan  Dispo: The patient is from: Home              Anticipated d/c is to: CIR              Patient currently is not medically stable to d/c.   Difficult to place patient No    Consultants:  Critical care medicine pulmonology Neurosurgery Infectious disease  Procedures: As above  Antimicrobials:    Currently on Ancef  Subjective:  Coherent pleasant, right knee is much better he is able to lift it off the bed and it still is warm but less swollen No fever no chills no nausea no vomiting and is willing to go to inpatient rehab for rehab  Objective: Vitals:   01/30/21 0349 01/30/21 0743 01/30/21 0830 01/30/21 1144  BP:  (!) 149/61  130/78  Pulse:  84  65  Resp:  (!) 24    Temp:  99.6 F (37.6 C)  (!) 97.5 F (36.4 C)  TempSrc:  Axillary  Oral  SpO2:  93% 92% 91%  Weight: 121.2 kg       Intake/Output Summary (Last 24 hours) at 01/30/2021 1513 Last data filed at 01/30/2021 0830 Gross per 24 hour  Intake 940 ml  Output 1605 ml  Net -665 ml    Filed Weights   01/27/21 0412 01/29/21 0500 01/30/21 0349  Weight: 121.2 kg 120.3 kg 121.2 kg    Examination:  EOMI NCAT Mallampati 4 S1-S2 no murmur CTA B Abdomen soft obese nontender Straight leg raise less painful Drain is putting out serous yellow fluid and had to be changed twice today per patient  Data Reviewed: personally reviewed   Sodium 132 potassium 3.9 AST/ALT down  from 1 29-1 03, ALT 62-45 bilirubin 0.9 Hepatitis panel negative WBC down from 17.3-16.3  Radiology Studies: MR THORACIC SPINE W WO CONTRAST  Result Date: 01/28/2021 CLINICAL DATA:  Discitis-osteomyelitis EXAM: MRI THORACIC AND LUMBAR SPINE WITHOUT AND WITH CONTRAST TECHNIQUE: Multiplanar and multiecho pulse sequences of the thoracic and lumbar spine were obtained without and with intravenous contrast. CONTRAST:  16mL GADAVIST GADOBUTROL 1 MMOL/ML IV SOLN COMPARISON:  01/11/2021 FINDINGS: MRI THORACIC SPINE FINDINGS Alignment:  Physiologic. Vertebrae: Spinal fusion hardware begins at T10. More superior levels are normal. Cord: Normal signal and morphology. There is a ventral epidural collection that begins at the level of the T7-8 disc space and extends inferiorly to T10-11, unchanged in craniocaudal extent. AP thickness measures approximately 5 mm. Paraspinal and other soft tissues: Loculated bilateral pleural collections. Small amount of fluid adjacent to the left T12 spinal rod. The fluid at the right spinal rod has substantially resolved. Disc levels: There is abnormal disc signal at T9-10 and T10-11. The T10-11 finding is unchanged but the T9-10 abnormality is new. Circumferential dural thickening and enhancement at the T10 level effaces the thecal sac. This is poorly visualized on the prior study on multiple sequences, but appears to be unchanged based on the axial T2-weighted imaging. MRI LUMBAR SPINE FINDINGS Segmentation:  Standard Alignment:  Normal Vertebrae: Posterior fusion hardware extends inferiorly to the  sacrum. There are interbody spacers at L4-5 and L5-S1. Conus medullaris: Extends to the L1-2 level and appears normal. Paraspinal and other soft tissues: Negative. Disc levels: Degenerative findings are unchanged since 01/11/2021 L1-2: Normal disc.  No stenosis. L2-3: Disc desiccation without spinal canal stenosis. L3-4: Posterior decompression no spinal canal stenosis. L4-5: Posterior  decompression.  Mild left foraminal stenosis. L5-S1: No disc herniation.  Mild left foraminal stenosis. IMPRESSION: 1. 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 Circumferential dural thickening at the T10 level again effaces the thecal sac with mass effect on the spinal cord. 2. New abnormal disc signal at T9-10 and unchanged abnormal signal T10-11, consistent with discitis-osteomyelitis. 3. No lumbar spinal canal stenosis. 4. Loculated bilateral pleural collections. Electronically Signed   By: Deatra Robinson M.D.   On: 01/28/2021 23:33   MR Lumbar Spine W Wo Contrast  Result Date: 01/28/2021 CLINICAL DATA:  Discitis-osteomyelitis EXAM: MRI THORACIC AND LUMBAR SPINE WITHOUT AND WITH CONTRAST TECHNIQUE: Multiplanar and multiecho pulse sequences of the thoracic and lumbar spine were obtained without and with intravenous contrast. CONTRAST:  74mL GADAVIST GADOBUTROL 1 MMOL/ML IV SOLN COMPARISON:  01/11/2021 FINDINGS: MRI THORACIC SPINE FINDINGS Alignment:  Physiologic. Vertebrae: Spinal fusion hardware begins at T10. More superior levels are normal. Cord: Normal signal and morphology. There is a ventral epidural collection that begins at the level of the T7-8 disc space and extends inferiorly to T10-11, unchanged in craniocaudal extent. AP thickness measures approximately 5 mm. Paraspinal and other soft tissues: Loculated bilateral pleural collections. Small amount of fluid adjacent to the left T12 spinal rod. The fluid at the right spinal rod has substantially resolved. Disc levels: There is abnormal disc signal at T9-10 and T10-11. The T10-11 finding is unchanged but the T9-10 abnormality is new. Circumferential dural thickening and enhancement at the T10 level effaces the thecal sac. This is poorly visualized on the prior study on multiple sequences, but appears to be unchanged based on the axial T2-weighted imaging. MRI LUMBAR SPINE  FINDINGS Segmentation:  Standard Alignment:  Normal Vertebrae: Posterior fusion hardware extends inferiorly to the sacrum. There are interbody spacers at L4-5 and L5-S1. Conus medullaris: Extends to the L1-2 level and appears normal. Paraspinal and other soft tissues: Negative. Disc levels: Degenerative findings are unchanged since 01/11/2021 L1-2: Normal disc.  No stenosis. L2-3: Disc desiccation without spinal canal stenosis. L3-4: Posterior decompression no spinal canal stenosis. L4-5: Posterior decompression.  Mild left foraminal stenosis. L5-S1: No disc herniation.  Mild left foraminal stenosis. IMPRESSION: 1. 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 Circumferential dural thickening at the T10 level again effaces the thecal sac with mass effect on the spinal cord. 2. New abnormal disc signal at T9-10 and unchanged abnormal signal T10-11, consistent with discitis-osteomyelitis. 3. No lumbar spinal canal stenosis. 4. Loculated bilateral pleural collections. Electronically Signed   By: Deatra Robinson M.D.   On: 01/28/2021 23:33     Scheduled Meds:  Chlorhexidine Gluconate Cloth  6 each Topical Daily   colchicine  0.6 mg Oral Daily   docusate sodium  100 mg Oral BID   enoxaparin (LOVENOX) injection  40 mg Subcutaneous Daily   feeding supplement  237 mL Oral TID BM   multivitamin with minerals  1 tablet Oral Daily   sodium chloride flush  10-40 mL Intracatheter Q12H   sodium chloride flush  3 mL Intravenous Q12H  Continuous Infusions:   ceFAZolin (ANCEF) IV 2 g (01/30/21 1205)     LOS: 10 days   Time spent: 24  Rhetta Mura, MD [facility] To contact the attending provider between 7A-7P or the covering provider during after hours 7P-7A, please log into the web site [email or web address] and access using universal [place] password for that web site. If you do not have the password, please call the hospital  operator.  01/30/2021, 3:13 PM

## 2021-01-30 NOTE — Progress Notes (Signed)
Pt does not want to go on home system for bedtime. Rt will cont to monitor. Pt is currently resting comfortably on 2L [place] sat 97%

## 2021-01-30 NOTE — Progress Notes (Addendum)
Subjective: Patient reports that he is doing well overall. His knee pain is improved from yesterday. No acute events overnight.   Objective: Vital signs in last 24 hours: Temp:  [99.2 F (37.3 C)-100.8 F (38.2 C)] 99.6 F (37.6 C) (08/05 0743) Pulse Rate:  [82-87] 84 (08/05 0743) Resp:  [16-24] 24 (08/05 0743) BP: (132-151)/(61-79) 149/61 (08/05 0743) SpO2:  [85 %-97 %] 92 % (08/05 0830) Weight:  [121.2 kg] 121.2 kg (08/05 0349)  Intake/Output from previous day: 08/04 0701 - 08/05 0700 In: 740 [I.V.:40; IV Piggyback:700] Out: 2155 [Urine:1900; Drains:255] Intake/Output this shift: No intake/output data recorded.  Physical Exam: Patient is awake, A/O X 4, conversant, and in good spirits. He is in NAD, laying comfortably in his bed with stable vital signs. Speech is fluent and appropriate. MAEW with good strength, however movement of his lower extremities causes a significant amount of discomfort to the patient. Sensation to light touch is intact. PERLA, EOMI. CNs grossly intact. Dressing is clean dry intact. Incision is well approximated with no drainage, erythema, or edema.     Lab Results: Recent Labs    01/29/21 0331 01/30/21 0310  WBC 17.3* 16.3*  HGB 10.1* 8.8*  HCT 31.8* 28.4*  PLT 415* 428*   BMET Recent Labs    01/29/21 0331 01/30/21 0310  NA 129* 132*  K 3.9 3.9  CL 85* 91*  CO2 33* 34*  GLUCOSE 204* 134*  BUN 21 17  CREATININE 0.98 0.79  CALCIUM 8.1* 7.7*    Studies/Results: MR THORACIC SPINE W WO CONTRAST  Result Date: 01/28/2021 CLINICAL DATA:  Discitis-osteomyelitis EXAM: MRI THORACIC AND LUMBAR SPINE WITHOUT AND WITH CONTRAST TECHNIQUE: Multiplanar and multiecho pulse sequences of the thoracic and lumbar spine were obtained without and with intravenous contrast. CONTRAST:  82mL GADAVIST GADOBUTROL 1 MMOL/ML IV SOLN COMPARISON:  01/11/2021 FINDINGS: MRI THORACIC SPINE FINDINGS Alignment:  Physiologic. Vertebrae: Spinal fusion hardware begins at T10.  More superior levels are normal. Cord: Normal signal and morphology. There is a ventral epidural collection that begins at the level of the T7-8 disc space and extends inferiorly to T10-11, unchanged in craniocaudal extent. AP thickness measures approximately 5 mm. Paraspinal and other soft tissues: Loculated bilateral pleural collections. Small amount of fluid adjacent to the left T12 spinal rod. The fluid at the right spinal rod has substantially resolved. Disc levels: There is abnormal disc signal at T9-10 and T10-11. The T10-11 finding is unchanged but the T9-10 abnormality is new. Circumferential dural thickening and enhancement at the T10 level effaces the thecal sac. This is poorly visualized on the prior study on multiple sequences, but appears to be unchanged based on the axial T2-weighted imaging. MRI LUMBAR SPINE FINDINGS Segmentation:  Standard Alignment:  Normal Vertebrae: Posterior fusion hardware extends inferiorly to the sacrum. There are interbody spacers at L4-5 and L5-S1. Conus medullaris: Extends to the L1-2 level and appears normal. Paraspinal and other soft tissues: Negative. Disc levels: Degenerative findings are unchanged since 01/11/2021 L1-2: Normal disc.  No stenosis. L2-3: Disc desiccation without spinal canal stenosis. L3-4: Posterior decompression no spinal canal stenosis. L4-5: Posterior decompression.  Mild left foraminal stenosis. L5-S1: No disc herniation.  Mild left foraminal stenosis. IMPRESSION: 1. 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 Circumferential dural thickening at the T10 level again effaces the thecal sac with mass effect on the spinal cord. 2. New abnormal disc signal at T9-10 and unchanged abnormal signal T10-11, consistent with  discitis-osteomyelitis. 3. No lumbar spinal canal stenosis. 4. Loculated bilateral pleural collections. Electronically Signed   By: Deatra Robinson M.D.   On: 01/28/2021  23:33   MR Lumbar Spine W Wo Contrast  Result Date: 01/28/2021 CLINICAL DATA:  Discitis-osteomyelitis EXAM: MRI THORACIC AND LUMBAR SPINE WITHOUT AND WITH CONTRAST TECHNIQUE: Multiplanar and multiecho pulse sequences of the thoracic and lumbar spine were obtained without and with intravenous contrast. CONTRAST:  45mL GADAVIST GADOBUTROL 1 MMOL/ML IV SOLN COMPARISON:  01/11/2021 FINDINGS: MRI THORACIC SPINE FINDINGS Alignment:  Physiologic. Vertebrae: Spinal fusion hardware begins at T10. More superior levels are normal. Cord: Normal signal and morphology. There is a ventral epidural collection that begins at the level of the T7-8 disc space and extends inferiorly to T10-11, unchanged in craniocaudal extent. AP thickness measures approximately 5 mm. Paraspinal and other soft tissues: Loculated bilateral pleural collections. Small amount of fluid adjacent to the left T12 spinal rod. The fluid at the right spinal rod has substantially resolved. Disc levels: There is abnormal disc signal at T9-10 and T10-11. The T10-11 finding is unchanged but the T9-10 abnormality is new. Circumferential dural thickening and enhancement at the T10 level effaces the thecal sac. This is poorly visualized on the prior study on multiple sequences, but appears to be unchanged based on the axial T2-weighted imaging. MRI LUMBAR SPINE FINDINGS Segmentation:  Standard Alignment:  Normal Vertebrae: Posterior fusion hardware extends inferiorly to the sacrum. There are interbody spacers at L4-5 and L5-S1. Conus medullaris: Extends to the L1-2 level and appears normal. Paraspinal and other soft tissues: Negative. Disc levels: Degenerative findings are unchanged since 01/11/2021 L1-2: Normal disc.  No stenosis. L2-3: Disc desiccation without spinal canal stenosis. L3-4: Posterior decompression no spinal canal stenosis. L4-5: Posterior decompression.  Mild left foraminal stenosis. L5-S1: No disc herniation.  Mild left foraminal stenosis.  IMPRESSION: 1. 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 Circumferential dural thickening at the T10 level again effaces the thecal sac with mass effect on the spinal cord. 2. New abnormal disc signal at T9-10 and unchanged abnormal signal T10-11, consistent with discitis-osteomyelitis. 3. No lumbar spinal canal stenosis. 4. Loculated bilateral pleural collections. Electronically Signed   By: Deatra Robinson M.D.   On: 01/28/2021 23:33   [place] COMPLETE JOINT SPACE STRUCTURES LOW RIGHT  Result Date: 01/28/2021 CLINICAL DATA:  Swelling assess for knee effusion EXAM: ULTRASOUND right LOWER EXTREMITY LIMITED TECHNIQUE: Ultrasound examination of the lower extremity soft tissues was performed in the area of clinical concern. COMPARISON:  None. FINDINGS: Ultrasound evaluation of the right knee performed in the region of redness and swelling. Diffuse subcutaneous edema. Large complex fluid collection superior to the patella measuring 8.8 x 2.7 x 2.8 cm with estimated volume of 28 mL. IMPRESSION: Subcutaneous edema with large complex fluid collection at the anterior knee superior to the patella which may reflect infected or inflammatory fluid collection, or potentially hematoma. This would be better characterized by MRI. Electronically Signed   By: Jasmine Pang M.D.   On: 01/28/2021 17:12    Assessment/Plan: Patent continues to be stable from a neurosurgical perspective. He does not need any surgical intervention. His lumbar pain is minimal and no new weakness numbness or tingling in the legs. Continue medical management of the patient. Continue IV antibiotic per ID.  LOS: 9 days   LOS: 10 days     Council Mechanic, DNP, NP-C 01/30/2021, 9:26 AM    Pain is  better controlled.  Continue current management.  I appreciate Hospitalist's help.

## 2021-01-30 NOTE — Progress Notes (Addendum)
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 S/p Rt knee tap by Ortho on 8/4 with findings of CPP crystals  Assessment RT knee Crystal arthropathy/Possible septic arthritis S/p Rt knee joint aspiration on 01/29/21, synovial fluid positive for CPP crystals, WBC 25, 000, N 98. 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 coli -Repeat MRI T L spine on 01/28/21 for concerns of urinary incontinence: Unchanged appearance of ventral epidural abscess extending from T7-T11 with likely source at the T10-11 disc space. Circumferential dural thickening at the T10 level again effaces the thecal sac with mass effect on the spinal cord//New abnormal disc signal at T9-10 and unchanged abnormal signal T10-11, consistent with discitis-osteomyelitis. -No plans for intervention by NeuroSx  Large left retroperitoneal fluid collection S/p drainage by OR on 8/2. Cx NG to date  Bilateral pleural effusion, exudative ( Possible empyema).  Right and left-sided pleural fluid culture no growth Status post left chest tube removal 7/29 and right chest  tube removal 7/30 7/27 TTE with no evidence of endocarditis Blood cx 7/26 and 7/29 NO growth  Pulmonology and CT sx - no plans for surgical intervention  Transaminitis - downtrending  Recommendations Continue cefazolin for now as liver enzymes are already downtrending Acute gout management per primary  Monitor CBC and CMP and cultuers  Following peripherally over the weekend, will make final recs early next week   Rest of the management as per the primary team. Thank you for the consult. Please page with pertinent questions or concerns.  ______________________________________________________________________ Subjective patient seen and examined at the bedside.  Lying up in the bed.  He feels the same as yesterday.  Denies any nausea vomiting diarrhea or rashes.  Right knee pain and swelling is better after joint aspiration.  Denies any prior history of gout Vitals BP (!) 151/79 (BP Location: Left Arm)   Pulse 82   Temp 99.6 F (37.6 C) (Oral)   Resp 20   Wt 121.2 kg   SpO2 96%   BMI 41.85 kg/m     Physical Exam Constitutional: Lying in the bed and appears comfortable    Comments:   Cardiovascular:     Rate and Rhythm: Normal rate and regular rhythm.     Heart sounds:  Pulmonary:     Effort: on nasal cannula    Comments:   Abdominal:     Palpations: Abdomen is soft.     Tenderness: Non tender and non distended, dtain in the LUQ of the abdomen with minimal purulent fluid in the bulb   Musculoskeletal:        General: Rt knee swollen/tender and restricted ROM  Skin:    Comments: No lesions or rashes   Neurological:     General:  Grossly non focal   Psychiatric:        Mood and Affect: Mood normal.   Pertinent Microbiology Results for orders placed or performed during the hospital encounter of 01/20/21  MRSA Next Gen by PCR, Nasal     Status: None   Collection Time: 01/20/21  5:07 PM   Specimen: Nasal Mucosa; Nasal Swab  Result Value Ref Range Status   MRSA  by PCR Next Gen NOT DETECTED NOT DETECTED Final    Comment: (NOTE) The GeneXpert MRSA Assay (FDA approved for NASAL specimens only), is one component of a comprehensive MRSA colonization surveillance program. It is not intended to diagnose MRSA infection nor to guide or monitor treatment for MRSA infections. Test performance is not FDA approved in patients less than 33 years old. Performed at [facility], [address], [place], [place] [address]   Culture, body fluid w Gram Stain-bottle     Status: None   Collection Time: 01/20/21  6:30 PM   Specimen: Fluid  Result Value Ref Range Status   Specimen Description FLUID LEFT PLEURAL  Final   Special Requests   Final    BOTTLES DRAWN AEROBIC AND ANAEROBIC Blood Culture adequate volume   Culture   Final    NO GROWTH 5 DAYS Performed at [facility], [address], [place], [place] [address]    Report Status 01/25/2021 FINAL  Final  Gram stain     Status: None   Collection Time: 01/20/21  6:30 PM   Specimen: Fluid  Result Value Ref Range Status   Specimen Description FLUID LEFT PLEURAL  Final   Special Requests NONE  Final   Gram Stain   Final    MODERATE WBC PRESENT, PREDOMINANTLY PMN NO ORGANISMS SEEN Performed at [facility], [address], [place], [place] [address]    Report Status 01/21/2021 FINAL  Final  Culture, body fluid w Gram Stain-bottle     Status: None   Collection Time: 01/20/21  6:30 PM   Specimen: Fluid  Result Value Ref Range Status   Specimen Description FLUID RIGHT PLEURAL  Final   Special Requests   Final    BOTTLES DRAWN AEROBIC AND ANAEROBIC Blood Culture adequate volume   Culture   Final    NO GROWTH 5 DAYS Performed at [facility], [address] [place], [place] [address]    Report Status 01/25/2021 FINAL  Final  Gram stain     Status: None   Collection Time: 01/20/21  6:30 PM   Specimen: Fluid  Result Value Ref Range Status   Specimen Description FLUID RIGHT  PLEURAL  Final   Special Requests NONE  Final   Gram Stain   Final    ABUNDANT WBC PRESENT, PREDOMINANTLY PMN NO ORGANISMS SEEN Performed at [facility], [address] [place], [place] [address]    Report Status 01/21/2021 FINAL  Final  Culture, blood (routine x 2)     Status: None   Collection Time: 01/20/21  8:39 PM   Specimen: BLOOD LEFT ARM  Result Value Ref Range Status   Specimen Description BLOOD LEFT ARM  Final   Special Requests   Final    BOTTLES DRAWN AEROBIC AND ANAEROBIC Blood Culture adequate volume   Culture   Final    NO GROWTH 5 DAYS Performed at [facility], [address] [place], [place] [address]    Report Status 01/25/2021 FINAL  Final  Culture, blood (routine x 2)     Status: None   Collection Time: 01/20/21  8:39 PM   Specimen: BLOOD LEFT HAND  Result Value Ref Range Status   Specimen Description BLOOD LEFT HAND  Final   Special Requests   Final    BOTTLES DRAWN AEROBIC AND ANAEROBIC Blood Culture adequate volume   Culture   Final    NO GROWTH 5 DAYS Performed at [facility], [address] [place], [place] [address]    Report Status 01/25/2021 FINAL  Final  Urine Culture     Status: None   Collection Time: 01/21/21  7:31 AM   Specimen: Urine, Clean Catch  Result Value Ref Range Status   Specimen Description URINE, CLEAN CATCH  Final   Special Requests NONE  Final   Culture   Final    NO GROWTH Performed at [facility], [address] [place], [place] [address]    Report Status 01/22/2021 FINAL  Final  Culture, blood (routine x 2)     Status: None   Collection Time: 01/23/21  9:52 PM   Specimen: BLOOD  Result Value Ref Range Status   Specimen Description BLOOD LEFT ANTECUBITAL  Final   Special Requests   Final    BOTTLES DRAWN AEROBIC AND ANAEROBIC Blood Culture adequate volume   Culture   Final    NO GROWTH 5 DAYS Performed at [facility], [address] [place], [place] [address]    Report Status  01/28/2021 FINAL  Final  Culture, blood (routine x 2)     Status: None   Collection Time: 01/23/21  9:52 PM   Specimen: BLOOD  Result Value Ref Range Status   Specimen Description BLOOD LEFT ANTECUBITAL  Final   Special Requests   Final    BOTTLES DRAWN AEROBIC AND ANAEROBIC Blood Culture adequate volume   Culture   Final    NO GROWTH 5 DAYS Performed at [facility], [address] [place], [place] [address]    Report Status 01/28/2021 FINAL  Final  Culture, blood (routine x 2)     Status: None (Preliminary result)   Collection Time: 01/26/21  7:52 AM   Specimen: BLOOD  Result Value Ref Range Status   Specimen Description BLOOD RIGHT ANTECUBITAL  Final   Special Requests   Final    BOTTLES DRAWN AEROBIC AND ANAEROBIC Blood Culture adequate volume   Culture   Final    NO GROWTH 3 DAYS Performed at [facility], [address] [place], [place] [address]    Report Status PENDING  Incomplete  Culture, blood (routine x 2)     Status: None (Preliminary result)   Collection Time: 01/26/21  7:53 AM   Specimen: BLOOD  Result Value Ref Range Status   Specimen Description BLOOD RIGHT ANTECUBITAL  Final   Special Requests   Final    BOTTLES DRAWN AEROBIC AND ANAEROBIC Blood Culture adequate volume   Culture   Final    NO GROWTH 3 DAYS Performed at [facility], [address] [place], [place] [address]    Report Status PENDING  Incomplete  Aerobic/Anaerobic Culture w Gram Stain (surgical/deep wound)     Status: None (Preliminary result)   Collection Time: 01/27/21  5:25 PM   Specimen: Abscess  Result Value Ref Range Status   Specimen Description ABSCESS  Final   Special Requests Normal  Final   Gram Stain  Final    RARE WBC PRESENT,BOTH PMN AND MONONUCLEAR NO ORGANISMS SEEN    Culture   Final    NO GROWTH 2 DAYS NO ANAEROBES ISOLATED; CULTURE IN PROGRESS FOR 5 DAYS Performed at [facility], [address], [place], [place] [address]    Report  Status PENDING  Incomplete  Body fluid culture w Gram Stain     Status: None (Preliminary result)   Collection Time: 01/28/21 12:57 PM   Specimen: Body Fluid  Result Value Ref Range Status   Specimen Description FLUID  Final   Special Requests SYNOVIAL RIGHT KNEE  Final   Gram Stain   Final    ABUNDANT WBC PRESENT, PREDOMINANTLY PMN NO ORGANISMS SEEN Performed at [facility], [address] [place], [place] [address]    Culture PENDING  Incomplete   Report Status PENDING  Incomplete     Pertinent Lab. CBC Latest Ref Rng & Units 01/30/2021 01/29/2021 01/28/2021  WBC 4.0 - 10.5 K/uL 16.3(H) 17.3(H) 17.5(H)  Hemoglobin 13.0 - 17.0 g/dL 4.6(E) 10.1(L) 8.9(L)  Hematocrit 39.0 - 52.0 % 28.4(L) 31.8(L) 28.7(L)  Platelets 150 - 400 K/uL 428(H) 415(H) 362   CMP Latest Ref Rng & Units 01/30/2021 01/29/2021 01/28/2021  Glucose 70 - 99 mg/dL 703(J) 009(F) 818(E)  BUN 8 - 23 mg/dL 17 21 22   Creatinine 0.61 - 1.24 mg/dL 9.93 7.16  Sodium 135 - 145 mmol/L 132(L) 129(L) 130(L)  Potassium 3.5 - 5.1 mmol/L 3.9 3.9 3.7  Chloride 98 - 111 mmol/L 91(L) 85(L) 86(L)  CO2 22 - 32 mmol/L 34(H) 33(H) 34(H)  Calcium 8.9 - 10.3 mg/dL 7.7(L) 8.1(L) 7.6(L)  Total Protein 6.5 - 8.1 g/dL 5.1(L) 5.4(L) -  Total Bilirubin 0.3 - 1.2 mg/dL 0.9 0.5 -  Alkaline Phos 38 - 126 U/L 116 127(H) -  AST 15 - 41 U/L 103(H) 129(H) -  ALT 0 - 44 U/L 45(H) 62(H) -     Pertinent Imaging today Plain films and CT images have been personally visualized and interpreted; radiology reports have been reviewed. Decision making incorporated into the Impression / Recommendations.  MRI T L spine 01/28/21 IMPRESSION: 1. 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 Circumferential dural thickening at the T10 level again effaces the thecal sac with mass effect on the spinal cord. 2. New abnormal disc signal at T9-10 and unchanged abnormal signal T10-11,  consistent with discitis-osteomyelitis. 3. No lumbar spinal canal stenosis. 4. Loculated bilateral pleural collections.  03/30/21 RT knee joint 01/28/21 IMPRESSION: Subcutaneous edema with large complex fluid collection at the anterior knee superior to the patella which may reflect infected or inflammatory fluid collection, or potentially hematoma. This would be better characterized by MRI.  I spent more than 35 minutes for this patient encounter including review of prior medical records, coordination of care  with greater than 50% of time being face to face/counseling and discussing diagnostics/treatment plan with the patient/family.  Electronically signed by:   03/30/21, MD Infectious Disease Physician [facility] Pager: [phone]

## 2021-01-30 NOTE — Progress Notes (Addendum)
Referring Physician(s): Dr. West Bali   Supervising Physician: Corrie Mckusick  Patient Status:  [facility] - In-pt  Chief Complaint: Left retroperitoneal fluid collection s/p drain placement in IR 01/27/21  Subjective: Patient sitting up in chair watching TV. No apparent discomfort/distress observed. He denies any issues with left RP drain.   Allergies: Patient has no known allergies.  Medications: Prior to Admission medications   Medication Sig Start Date End Date Taking? Authorizing Provider  ceFAZolin (ANCEF) IVPB Inject 2 g into the vein every 8 (eight) hours. Indication:  vertebral osteomyelitis First Dose: No Last Day of Therapy:  02/24/2021 Labs - Once weekly:  CBC/D and BMP, Labs - Every other week:  ESR and CRP Method of administration: IV Push Method of administration may be changed at the discretion of home infusion pharmacist based upon assessment of the patient and/or caregiver's ability to self-administer the medication ordered. 01/15/21 02/24/21  Marvis Moeller, NP  cholecalciferol (VITAMIN D3) 25 MCG (1000 UNIT) tablet Take 1,000 Units by mouth daily.    [provider]  diclofenac (VOLTAREN) 75 MG EC tablet Take 75 mg by mouth 2 (two) times daily. 11/13/20   [provider]  Multiple Vitamin (MULTIVITAMIN WITH MINERALS) TABS tablet Take 1 tablet by mouth daily.    [provider]  oxyCODONE (OXY IR/ROXICODONE) 5 MG immediate release tablet Take 1 tablet (5 mg total) by mouth every 6 (six) hours as needed for moderate pain. 01/15/21   Little Ishikawa, MD  polyethylene glycol (MIRALAX / GLYCOLAX) 17 g packet Take 17 g by mouth 2 (two) times daily. 01/15/21   Little Ishikawa, MD     Vital Signs: BP 130/78 (BP Location: Left Arm)   Pulse 65   Temp (!) 97.5 F (36.4 C) (Oral)   Resp (!) 24   Wt 267 lb 3.2 oz (121.2 kg)   SpO2 91%   BMI 41.85 kg/m   Physical Exam Constitutional:      General: He is not in acute distress.     Appearance: He is not ill-appearing.  Pulmonary:     Effort: Pulmonary effort is normal.  Abdominal:     Tenderness: There is no abdominal tenderness.     Comments: LP drain to suction. Unable to assess site - patient sitting up in chair. Bulb full with 100 ml of serous fluid. Patient reports no issues with the drain.   Skin:    General: Skin is warm and dry.  Neurological:     Mental Status: He is alert and oriented to person, place, and time.    Imaging: MR THORACIC SPINE W WO CONTRAST  Result Date: 01/28/2021 CLINICAL DATA:  Discitis-osteomyelitis EXAM: MRI THORACIC AND LUMBAR SPINE WITHOUT AND WITH CONTRAST TECHNIQUE: Multiplanar and multiecho pulse sequences of the thoracic and lumbar spine were obtained without and with intravenous contrast. CONTRAST:  73m GADAVIST GADOBUTROL 1 MMOL/ML IV SOLN COMPARISON:  01/11/2021 FINDINGS: MRI THORACIC SPINE FINDINGS Alignment:  Physiologic. Vertebrae: Spinal fusion hardware begins at T10. More superior levels are normal. Cord: Normal signal and morphology. There is a ventral epidural collection that begins at the level of the T7-8 disc space and extends inferiorly to T10-11, unchanged in craniocaudal extent. AP thickness measures approximately 5 mm. Paraspinal and other soft tissues: Loculated bilateral pleural collections. Small amount of fluid adjacent to the left T12 spinal rod. The fluid at the right spinal rod has substantially resolved. Disc levels: There is abnormal disc signal at T9-10 and T10-11. The  T10-11 finding is unchanged but the T9-10 abnormality is new. Circumferential dural thickening and enhancement at the T10 level effaces the thecal sac. This is poorly visualized on the prior study on multiple sequences, but appears to be unchanged based on the axial T2-weighted imaging. MRI LUMBAR SPINE FINDINGS Segmentation:  Standard Alignment:  Normal Vertebrae: Posterior fusion hardware extends inferiorly to the sacrum. There are interbody spacers  at L4-5 and L5-S1. Conus medullaris: Extends to the L1-2 level and appears normal. Paraspinal and other soft tissues: Negative. Disc levels: Degenerative findings are unchanged since 01/11/2021 L1-2: Normal disc.  No stenosis. L2-3: Disc desiccation without spinal canal stenosis. L3-4: Posterior decompression no spinal canal stenosis. L4-5: Posterior decompression.  Mild left foraminal stenosis. L5-S1: No disc herniation.  Mild left foraminal stenosis. IMPRESSION: 1. 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 Circumferential dural thickening at the T10 level again effaces the thecal sac with mass effect on the spinal cord. 2. New abnormal disc signal at T9-10 and unchanged abnormal signal T10-11, consistent with discitis-osteomyelitis. 3. No lumbar spinal canal stenosis. 4. Loculated bilateral pleural collections. Electronically Signed   By: Ulyses Jarred M.D.   On: 01/28/2021 23:33   MR Lumbar Spine W Wo Contrast  Result Date: 01/28/2021 CLINICAL DATA:  Discitis-osteomyelitis EXAM: MRI THORACIC AND LUMBAR SPINE WITHOUT AND WITH CONTRAST TECHNIQUE: Multiplanar and multiecho pulse sequences of the thoracic and lumbar spine were obtained without and with intravenous contrast. CONTRAST:  41m GADAVIST GADOBUTROL 1 MMOL/ML IV SOLN COMPARISON:  01/11/2021 FINDINGS: MRI THORACIC SPINE FINDINGS Alignment:  Physiologic. Vertebrae: Spinal fusion hardware begins at T10. More superior levels are normal. Cord: Normal signal and morphology. There is a ventral epidural collection that begins at the level of the T7-8 disc space and extends inferiorly to T10-11, unchanged in craniocaudal extent. AP thickness measures approximately 5 mm. Paraspinal and other soft tissues: Loculated bilateral pleural collections. Small amount of fluid adjacent to the left T12 spinal rod. The fluid at the right spinal rod has substantially resolved. Disc levels: There is  abnormal disc signal at T9-10 and T10-11. The T10-11 finding is unchanged but the T9-10 abnormality is new. Circumferential dural thickening and enhancement at the T10 level effaces the thecal sac. This is poorly visualized on the prior study on multiple sequences, but appears to be unchanged based on the axial T2-weighted imaging. MRI LUMBAR SPINE FINDINGS Segmentation:  Standard Alignment:  Normal Vertebrae: Posterior fusion hardware extends inferiorly to the sacrum. There are interbody spacers at L4-5 and L5-S1. Conus medullaris: Extends to the L1-2 level and appears normal. Paraspinal and other soft tissues: Negative. Disc levels: Degenerative findings are unchanged since 01/11/2021 L1-2: Normal disc.  No stenosis. L2-3: Disc desiccation without spinal canal stenosis. L3-4: Posterior decompression no spinal canal stenosis. L4-5: Posterior decompression.  Mild left foraminal stenosis. L5-S1: No disc herniation.  Mild left foraminal stenosis. IMPRESSION: 1. 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 Circumferential dural thickening at the T10 level again effaces the thecal sac with mass effect on the spinal cord. 2. New abnormal disc signal at T9-10 and unchanged abnormal signal T10-11, consistent with discitis-osteomyelitis. 3. No lumbar spinal canal stenosis. 4. Loculated bilateral pleural collections. Electronically Signed   By: KUlyses JarredM.D.   On: 01/28/2021 23:33   [place]PLETE JOINT SPACE STRUCTURES LOW RIGHT  Result Date: 01/28/2021 CLINICAL DATA:  Swelling assess for knee effusion  EXAM: ULTRASOUND right LOWER EXTREMITY LIMITED TECHNIQUE: Ultrasound examination of the lower extremity soft tissues was performed in the area of clinical concern. COMPARISON:  None. FINDINGS: Ultrasound evaluation of the right knee performed in the region of redness and swelling. Diffuse subcutaneous edema. Large complex fluid collection superior to  the patella measuring 8.8 x 2.7 x 2.8 cm with estimated volume of 28 mL. IMPRESSION: Subcutaneous edema with large complex fluid collection at the anterior knee superior to the patella which may reflect infected or inflammatory fluid collection, or potentially hematoma. This would be better characterized by MRI. Electronically Signed   By: Donavan Foil M.D.   On: 01/28/2021 17:12   CT IMAGE GUIDED DRAINAGE BY PERCUTANEOUS CATHETER  Result Date: 01/28/2021 INDICATION: 71 year old male status post back surgery with left retroperitoneal collection. EXAM: CT IMAGE GUIDED DRAINAGE BY PERCUTANEOUS CATHETER COMPARISON:  CT chest abdomen pelvis, 01/26/2021 MEDICATIONS: The patient is currently admitted to the hospital and receiving intravenous antibiotics. The antibiotics were administered within an appropriate time frame prior to the initiation of the procedure. ANESTHESIA/SEDATION: Moderate (conscious) sedation was employed during this procedure. A total of Versed 2 mg and Fentanyl 100 mcg was administered intravenously. Moderate Sedation Time: 23 minutes. The patient's level of consciousness and vital signs were monitored continuously by radiology nursing throughout the procedure under my direct supervision. CONTRAST:  None COMPLICATIONS: None immediate. PROCEDURE: Informed written consent was obtained from the patient after a discussion of the risks, benefits and alternatives to treatment. The patient was placed supine on the CT gantry and a pre procedural CT was performed re-demonstrating the known abscess/fluid collection within the left retroperitoneum. The procedure was planned. A timeout was performed prior to the initiation of the procedure. The left lower quadrant and flank was prepped and draped in the usual sterile fashion. The overlying soft tissues were anesthetized with 1% lidocaine with epinephrine. Appropriate trajectory was planned with the use of a 22 gauge spinal needle. An 18 gauge trocar needle  was advanced into the abscess/fluid collection and a short Amplatz super stiff wire was coiled within the collection. Appropriate positioning was confirmed with a limited CT scan. The tract was serially dilated allowing placement of a 12 [place] all-purpose drainage catheter. Appropriate positioning was confirmed with a limited postprocedural CT scan. Forty ml of urine-colored fluid was aspirated. The tube was connected to a drainage bag and sutured in place. A dressing was placed. The patient tolerated the procedure well without immediate post procedural complication. IMPRESSION: Successful CT guided placement of a 78 French all purpose drain catheter into the left retroperitoneum with aspiration of 40 mL of urine-colored fluid. Samples were sent to the laboratory as requested by the ordering clinical team. Michaelle Birks, MD [facility] Radiology Electronically Signed   By: Michaelle Birks MD   On: 01/28/2021 16:17    Labs:  CBC: Recent Labs    01/24/21 0253 01/28/21 0511 01/29/21 0331 01/30/21 0310  WBC 16.5* 17.5* 17.3* 16.3*  HGB 9.9* 8.9* 10.1* 8.8*  HCT 30.2* 28.7* 31.8* 28.4*  PLT 380 362 415* 428*    COAGS: No results for input(s): INR, APTT in the last 8760 hours.  BMP: Recent Labs    01/26/21 1432 01/28/21 0511 01/29/21 0331 01/30/21 0310  NA 127* 130* 129* 132*  K 3.6 3.7 3.9 3.9  CL 85* 86* 85* 91*  CO2 30 34* 33* 34*  GLUCOSE 261* 195* 204* 134*  BUN _0 CALCIUM 7.8*  7.6* 8.1* 7.7*  CREATININE 1.08 1.01 0.98 0.79  GFRNONAA >60 >60 >60 >60    LIVER FUNCTION TESTS: Recent Labs    01/15/21 0600 01/20/21 2039 01/29/21 0331 01/30/21 0310  BILITOT 1.3* 0.5 0.5 0.9  AST 66* 37 129* 103*  ALT 70* 13 62* 45*  ALKPHOS 81 92 127* 116  PROT 4.8* 5.1* 5.4* 5.1*  ALBUMIN 1.9* 1.7* 1.6* 1.5*    Assessment and Plan:  Left retroperitoneal drain s/p drain placement 01/27/21  Patient is afebrile, WBC slightly  decreased at 16.3. Output for the past 24 hours 255 ml with another 100 ml if JP bulb.   Continue current drain management - flush drain and document output once per shift. Keep the site clean and dry. Due to large volume output will plan to switch out bulb and place drain to gravity bag.   Other plans per primary teams. IR will continue to follow.   Electronically Signed: Soyla Dryer, AGACNP-BC [phone] 01/30/2021, 3:16 PM   I spent a total of 15 Minutes at the the patient's bedside AND on the patient's hospital floor or unit, greater than 50% of which was counseling/coordinating care for left RP drain.

## 2021-01-30 NOTE — Progress Notes (Signed)
SATURATION QUALIFICATIONS: (This note is used to comply with regulatory documentation for home oxygen)  Patient Saturations on Room Air at Rest = 92-93%  Patient Saturations on Room Air while Ambulating = 86%  Please briefly explain why patient needs home oxygen: Pt's sats decrease to 86% without supplemental O2 while performing functional mobility.   Raymond Gurney, PT, DPT Acute Rehabilitation Services  Pager: [phone] Office: [phone]

## 2021-01-30 NOTE — Progress Notes (Signed)
Occupational Therapy Treatment Patient Details Name: Marco Kennedy MRN: [id number] DOB: February 19, 1951 Today's Date: 01/30/2021    History of present illness Pt is a 70 y.o. male who presented 01/20/21 with SOB, increased weight gain, back pain, and fever. Of note, recent admission from 7/16-22 for sepsis secondary to moderate-sized abscess near the pelvic hardware. CT of the chest show empyema as well as discitis osteomyelitis of T10. S/p bil chest tube placement 7/26, L chest tube removed 7/29. L JP drain insertion 8/2. S/p Right knee aspiration and injection 8/4. PMH: back surgery with pelvis fixation in 03/2020, OSA on BIPAP, arthritis.   OT comments  Pt is making steady progress toward goals.  He demonstrates improved activity tolerance and is able to perform functional transfers with min A.  He is unable to access feet and will likely benefit from AE for LB ADLs.   Follow Up Recommendations  CIR;Supervision/Assistance - 24 hour    Equipment Recommendations  None recommended by OT    Recommendations for Other Services Rehab consult    Precautions / Restrictions Precautions Precautions: Fall Precaution Comments: monitor SpO2; L JP drain       Mobility Bed Mobility               General bed mobility comments: sitting up in chair    Transfers Overall transfer level: Needs assistance Equipment used: Rolling walker (2 wheeled) Transfers: Sit to/from [employer] Sit to Stand: Min assist Stand pivot transfers: Min assist       General transfer comment: assist to rise and balance    Balance Overall balance assessment: Needs assistance Sitting-balance support: Feet supported;No upper extremity supported Sitting balance-Leahy Scale: Fair     Standing balance support: Bilateral upper extremity supported;During functional activity Standing balance-Leahy Scale: Poor Standing balance comment: Reliant on UE support on RW and physical assistance.                            ADL either performed or assessed with clinical judgement   ADL Overall ADL's : Needs assistance/impaired Eating/Feeding: Modified independent   Grooming: Wash/dry hands;Wash/dry face;Oral care;Brushing hair;Set up;Sitting                   Toilet Transfer: Minimal assistance;Stand-pivot;BSC;RW   Toileting- Clothing Manipulation and Hygiene: Maximal assistance;+2 for physical assistance;+2 for safety/equipment;Cueing for safety;Cueing for sequencing;Sit to/from stand       Functional mobility during ADLs: Minimal assistance;Rolling walker       Vision       Perception     Praxis      Cognition Arousal/Alertness: Awake/alert Behavior During Therapy: WFL for tasks assessed/performed Overall Cognitive Status: Within Functional Limits for tasks assessed                                 General Comments: WFL for basic tasks        Exercises Other Exercises Other Exercises: Pt performed 10 reps chair push ups   Shoulder Instructions       General Comments VSS    Pertinent Vitals/ Pain       Pain Assessment: 0-10 Pain Score: 6  Pain Location: R knee Pain Descriptors / Indicators: Discomfort;Grimacing;Guarding Pain Intervention(s): Repositioned;Monitored during session  Home Living  Prior Functioning/Environment              Frequency  Min 2X/week        Progress Toward Goals  OT Goals(current goals can now be found in the care plan section)  Progress towards OT goals: Progressing toward goals     Plan Discharge plan remains appropriate    Co-evaluation                 AM-PAC OT "6 Clicks" Daily Activity     Outcome Measure   Help from another person eating meals?: A Little Help from another person taking care of personal grooming?: A Little Help from another person toileting, which includes using toliet, bedpan, or urinal?: A  Lot Help from another person bathing (including washing, rinsing, drying)?: A Lot Help from another person to put on and taking off regular upper body clothing?: A Little Help from another person to put on and taking off regular lower body clothing?: A Lot 6 Click Score: 15    End of Session Equipment Utilized During Treatment: Rolling walker  OT Visit Diagnosis: Unsteadiness on feet (R26.81);Other abnormalities of gait and mobility (R26.89);Muscle weakness (generalized) (M62.81);Pain;Other symptoms and signs involving cognitive function Pain - Right/Left: Left Pain - part of body: Leg   Activity Tolerance Patient tolerated treatment well   Patient Left in chair;with call bell/phone within reach;with chair alarm set   Nurse Communication Mobility status        Time: 4287-6811 OT Time Calculation (min): 27 min  Charges: OT General Charges $OT Visit: 1 Visit OT Treatments $Therapeutic Activity: 23-37 mins  Eber Jones., OTR/L Acute Rehabilitation Services Pager [phone] Office [phone]    Jeani Hawking M 01/30/2021, 4:36 PM

## 2021-01-31 DIAGNOSIS — A419 Sepsis, unspecified organism: Secondary | ICD-10-CM | POA: Diagnosis not present

## 2021-01-31 LAB — CULTURE, BLOOD (ROUTINE X 2)
Culture: NO GROWTH
Culture: NO GROWTH
Special Requests: ADEQUATE
Special Requests: ADEQUATE

## 2021-01-31 LAB — COMPREHENSIVE METABOLIC PANEL
ALT: 36 U/L (ref 0–44)
AST: 79 U/L — ABNORMAL HIGH (ref 15–41)
Albumin: 1.4 g/dL — ABNORMAL LOW (ref 3.5–5.0)
Alkaline Phosphatase: 115 U/L (ref 38–126)
Anion gap: 6 (ref 5–15)
BUN: 17 mg/dL (ref 8–23)
CO2: 35 mmol/L — ABNORMAL HIGH (ref 22–32)
Calcium: 7.7 mg/dL — ABNORMAL LOW (ref 8.9–10.3)
Chloride: 91 mmol/L — ABNORMAL LOW (ref 98–111)
Creatinine, Ser: 0.76 mg/dL (ref 0.61–1.24)
GFR, Estimated: >60 mL/min (ref >60)
Glucose, Bld: 147 mg/dL — ABNORMAL HIGH (ref 70–99)
Potassium: 4 mmol/L (ref 3.5–5.1)
Sodium: 132 mmol/L — ABNORMAL LOW (ref 135–145)
Total Bilirubin: 0.6 mg/dL (ref 0.3–1.2)
Total Protein: 4.9 g/dL — ABNORMAL LOW (ref 6.5–8.1)

## 2021-01-31 LAB — CBC WITH DIFFERENTIAL/PLATELET
Abs Immature Granulocytes: 0.15 10*3/uL — ABNORMAL HIGH (ref 0.00–0.07)
Basophils Absolute: 0.1 10*3/uL (ref 0.0–0.1)
Basophils Relative: 0 %
Eosinophils Absolute: 0.3 10*3/uL (ref 0.0–0.5)
Eosinophils Relative: 2 %
HCT: 27.5 % — ABNORMAL LOW (ref 39.0–52.0)
Hemoglobin: 8.7 g/dL — ABNORMAL LOW (ref 13.0–17.0)
Immature Granulocytes: 1 %
Lymphocytes Relative: 4 %
Lymphs Abs: 0.6 10*3/uL — ABNORMAL LOW (ref 0.7–4.0)
MCH: 28.2 pg (ref 26.0–34.0)
MCHC: 31.6 g/dL (ref 30.0–36.0)
MCV: 89 fL (ref 80.0–100.0)
Monocytes Absolute: 1.1 10*3/uL — ABNORMAL HIGH (ref 0.1–1.0)
Monocytes Relative: 8 %
Neutro Abs: 11.7 10*3/uL — ABNORMAL HIGH (ref 1.7–7.7)
Neutrophils Relative %: 85 %
Platelets: 463 10*3/uL — ABNORMAL HIGH (ref 150–400)
RBC: 3.09 MIL/uL — ABNORMAL LOW (ref 4.22–5.81)
RDW: 13.2 % (ref 11.5–15.5)
WBC: 13.8 10*3/uL — ABNORMAL HIGH (ref 4.0–10.5)
nRBC: 0 % (ref 0.0–0.2)

## 2021-01-31 LAB — PROTIME-INR
INR: 1.3 — ABNORMAL HIGH (ref 0.8–1.2)
Prothrombin Time: 16.2 seconds — ABNORMAL HIGH (ref 11.4–15.2)

## 2021-01-31 MED ORDER — SENNOSIDES-DOCUSATE SODIUM 8.6-50 MG PO TABS
1.0000 | ORAL_TABLET | Freq: Every day | ORAL | Status: DC
  Administered 2021-01-31: 1 via ORAL
  Filled 2021-01-31: qty 1

## 2021-01-31 NOTE — Progress Notes (Signed)
Pt does not want to go on home system for bedtime. Pt is currently resting comfortably on 2L [place] sat 95%. RT will cont to monitor.

## 2021-01-31 NOTE — Progress Notes (Addendum)
Referring Physician(s): Dr. West Bali, S.   Supervising Physician: Ruthann Cancer  Patient Status:  [facility] - In-pt  Chief Complaint:  Left retroperitoneal fluid collection S/p drain placement with IR on 8/2, changed from suction bulb to gravity bag due to high OP on 8/5   Subjective:  Pt laying in bed, not in acute distress.  No complaints regarding L RP drain, denies chills, N/V.   Allergies: Patient has no known allergies.  Medications: Prior to Admission medications   Medication Sig Start Date End Date Taking? Authorizing Provider  ceFAZolin (ANCEF) IVPB Inject 2 g into the vein every 8 (eight) hours. Indication:  vertebral osteomyelitis First Dose: No Last Day of Therapy:  02/24/2021 Labs - Once weekly:  CBC/D and BMP, Labs - Every other week:  ESR and CRP Method of administration: IV Push Method of administration may be changed at the discretion of home infusion pharmacist based upon assessment of the patient and/or caregiver's ability to self-administer the medication ordered. 01/15/21 02/24/21  Marvis Moeller, NP  cholecalciferol (VITAMIN D3) 25 MCG (1000 UNIT) tablet Take 1,000 Units by mouth daily.    [provider]  diclofenac (VOLTAREN) 75 MG EC tablet Take 75 mg by mouth 2 (two) times daily. 11/13/20   [provider]  Multiple Vitamin (MULTIVITAMIN WITH MINERALS) TABS tablet Take 1 tablet by mouth daily.    [provider]  oxyCODONE (OXY IR/ROXICODONE) 5 MG immediate release tablet Take 1 tablet (5 mg total) by mouth every 6 (six) hours as needed for moderate pain. 01/15/21   Little Ishikawa, MD  polyethylene glycol (MIRALAX / GLYCOLAX) 17 g packet Take 17 g by mouth 2 (two) times daily. 01/15/21   Little Ishikawa, MD     Vital Signs: BP (!) 136/107   Pulse 64   Temp 99 F (37.2 C) (Oral)   Resp 19   Wt 267 lb 3.2 oz (121.2 kg)   SpO2 97%   BMI 41.85 kg/m   Physical Exam Vitals reviewed.  Constitutional:       Appearance: Normal appearance.  HENT:     Head: Normocephalic and atraumatic.  Pulmonary:     Effort: Pulmonary effort is normal.  Abdominal:     General: Abdomen is flat.     Palpations: Abdomen is soft.  Skin:    General: Skin is warm and dry.     Coloration: Skin is not jaundiced or pale.     Comments: Positive L RP drain to a gravity bag Site is unremarkable with no erythema, edema, tenderness, bleeding or drainage. Stat lock in place. Dressing is clean, dry, and intact. 10 ml of  clear yellow colored fluid noted in the bag. Drain aspirates and flushes well.    Neurological:     Mental Status: He is alert and oriented to person, place, and time.  Psychiatric:        Mood and Affect: Mood normal.        Behavior: Behavior normal.    Imaging: MR THORACIC SPINE W WO CONTRAST  Result Date: 01/28/2021 CLINICAL DATA:  Discitis-osteomyelitis EXAM: MRI THORACIC AND LUMBAR SPINE WITHOUT AND WITH CONTRAST TECHNIQUE: Multiplanar and multiecho pulse sequences of the thoracic and lumbar spine were obtained without and with intravenous contrast. CONTRAST:  52m GADAVIST GADOBUTROL 1 MMOL/ML IV SOLN COMPARISON:  01/11/2021 FINDINGS: MRI THORACIC SPINE FINDINGS Alignment:  Physiologic. Vertebrae: Spinal fusion hardware begins at T10. More superior levels are normal. Cord: Normal signal and morphology. There  is a ventral epidural collection that begins at the level of the T7-8 disc space and extends inferiorly to T10-11, unchanged in craniocaudal extent. AP thickness measures approximately 5 mm. Paraspinal and other soft tissues: Loculated bilateral pleural collections. Small amount of fluid adjacent to the left T12 spinal rod. The fluid at the right spinal rod has substantially resolved. Disc levels: There is abnormal disc signal at T9-10 and T10-11. The T10-11 finding is unchanged but the T9-10 abnormality is new. Circumferential dural thickening and enhancement at the T10 level effaces the thecal sac.  This is poorly visualized on the prior study on multiple sequences, but appears to be unchanged based on the axial T2-weighted imaging. MRI LUMBAR SPINE FINDINGS Segmentation:  Standard Alignment:  Normal Vertebrae: Posterior fusion hardware extends inferiorly to the sacrum. There are interbody spacers at L4-5 and L5-S1. Conus medullaris: Extends to the L1-2 level and appears normal. Paraspinal and other soft tissues: Negative. Disc levels: Degenerative findings are unchanged since 01/11/2021 L1-2: Normal disc.  No stenosis. L2-3: Disc desiccation without spinal canal stenosis. L3-4: Posterior decompression no spinal canal stenosis. L4-5: Posterior decompression.  Mild left foraminal stenosis. L5-S1: No disc herniation.  Mild left foraminal stenosis. IMPRESSION: 1. 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 Circumferential dural thickening at the T10 level again effaces the thecal sac with mass effect on the spinal cord. 2. New abnormal disc signal at T9-10 and unchanged abnormal signal T10-11, consistent with discitis-osteomyelitis. 3. No lumbar spinal canal stenosis. 4. Loculated bilateral pleural collections. Electronically Signed   By: Ulyses Jarred M.D.   On: 01/28/2021 23:33   MR Lumbar Spine W Wo Contrast  Result Date: 01/28/2021 CLINICAL DATA:  Discitis-osteomyelitis EXAM: MRI THORACIC AND LUMBAR SPINE WITHOUT AND WITH CONTRAST TECHNIQUE: Multiplanar and multiecho pulse sequences of the thoracic and lumbar spine were obtained without and with intravenous contrast. CONTRAST:  25m GADAVIST GADOBUTROL 1 MMOL/ML IV SOLN COMPARISON:  01/11/2021 FINDINGS: MRI THORACIC SPINE FINDINGS Alignment:  Physiologic. Vertebrae: Spinal fusion hardware begins at T10. More superior levels are normal. Cord: Normal signal and morphology. There is a ventral epidural collection that begins at the level of the T7-8 disc space and extends inferiorly to  T10-11, unchanged in craniocaudal extent. AP thickness measures approximately 5 mm. Paraspinal and other soft tissues: Loculated bilateral pleural collections. Small amount of fluid adjacent to the left T12 spinal rod. The fluid at the right spinal rod has substantially resolved. Disc levels: There is abnormal disc signal at T9-10 and T10-11. The T10-11 finding is unchanged but the T9-10 abnormality is new. Circumferential dural thickening and enhancement at the T10 level effaces the thecal sac. This is poorly visualized on the prior study on multiple sequences, but appears to be unchanged based on the axial T2-weighted imaging. MRI LUMBAR SPINE FINDINGS Segmentation:  Standard Alignment:  Normal Vertebrae: Posterior fusion hardware extends inferiorly to the sacrum. There are interbody spacers at L4-5 and L5-S1. Conus medullaris: Extends to the L1-2 level and appears normal. Paraspinal and other soft tissues: Negative. Disc levels: Degenerative findings are unchanged since 01/11/2021 L1-2: Normal disc.  No stenosis. L2-3: Disc desiccation without spinal canal stenosis. L3-4: Posterior decompression no spinal canal stenosis. L4-5: Posterior decompression.  Mild left foraminal stenosis. L5-S1: No disc herniation.  Mild left foraminal stenosis. IMPRESSION: 1. 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 Circumferential dural thickening at the T10 level again  effaces the thecal sac with mass effect on the spinal cord. 2. New abnormal disc signal at T9-10 and unchanged abnormal signal T10-11, consistent with discitis-osteomyelitis. 3. No lumbar spinal canal stenosis. 4. Loculated bilateral pleural collections. Electronically Signed   By: Ulyses Jarred M.D.   On: 01/28/2021 23:33   MR KNEE RIGHT W WO CONTRAST  Result Date: 01/30/2021 CLINICAL DATA:  Right knee pain for 3 days. EXAM: MRI OF THE RIGHT KNEE WITHOUT AND WITH CONTRAST TECHNIQUE:  Multiplanar, multisequence MR imaging of the knee was performed before and after the administration of intravenous contrast. CONTRAST:  47m GADAVIST GADOBUTROL 1 MMOL/ML IV SOLN COMPARISON:  None. FINDINGS: Exam limited by patient motion. MENISCI Medial meniscus:  Degenerative changes without obvious tear. Lateral meniscus:  Probable chronic tear or prior meniscectomy. LIGAMENTS Cruciates:  Chronic ACL deficient knee.  PCL is intact. Collaterals:  Intact CARTILAGE Patellofemoral: Advanced degenerative chondrosis with joint space narrowing and spurring. Medial:  Moderate degenerative chondrosis. Lateral: Advanced degenerative chondrosis with full-thickness cartilage loss, joint space narrowing and spurring. Joint: There is a moderate to large joint effusion with moderate irregular surrounding synovial enhancement. Certainly could not exclude septic arthritis but this could also be non infectious synovitis. Recommend knee joint aspiration for further evaluation. Popliteal Fossa:  No popliteal mass or Baker's cyst. Extensor Mechanism: The patella retinacular structures are intact and the quadriceps and patellar tendons are intact. Bones: No findings suspicious for osteomyelitis. No marrow edema or abnormal enhancement. Other: Subcutaneous soft tissue swelling/edema could suggest cellulitis. There is also moderate edema like signal changes in the vastus medialis muscle and also in the posterior compartment muscles. Findings could suggest infectious myositis. No findings suspicious for pyomyositis. IMPRESSION: 1. Exam limited by patient motion. 2. Moderate to large joint effusion with moderate irregular surrounding synovial enhancement. Certainly could not exclude septic arthritis but this could also be non infectious synovitis. Recommend knee joint aspiration for further evaluation. 3. No findings suspicious for osteomyelitis. 4. Probable cellulitis and myositis without discrete soft tissue abscess or pyomyositis. 5.  Chronic ACL deficient knee. 6. Tricompartmental degenerative changes most significant in the lateral. Electronically Signed   By: PMarijo SanesM.D.   On: 01/30/2021 20:18   [place]PLETE JOINT SPACE STRUCTURES LOW RIGHT  Result Date: 01/28/2021 CLINICAL DATA:  Swelling assess for knee effusion EXAM: ULTRASOUND right LOWER EXTREMITY LIMITED TECHNIQUE: Ultrasound examination of the lower extremity soft tissues was performed in the area of clinical concern. COMPARISON:  None. FINDINGS: Ultrasound evaluation of the right knee performed in the region of redness and swelling. Diffuse subcutaneous edema. Large complex fluid collection superior to the patella measuring 8.8 x 2.7 x 2.8 cm with estimated volume of 28 mL. IMPRESSION: Subcutaneous edema with large complex fluid collection at the anterior knee superior to the patella which may reflect infected or inflammatory fluid collection, or potentially hematoma. This would be better characterized by MRI. Electronically Signed   By: KDonavan FoilM.D.   On: 01/28/2021 17:12   CT IMAGE GUIDED DRAINAGE BY PERCUTANEOUS CATHETER  Result Date: 01/28/2021 INDICATION: 70year old male status post back surgery with left retroperitoneal collection. EXAM: CT IMAGE GUIDED DRAINAGE BY PERCUTANEOUS CATHETER COMPARISON:  CT chest abdomen pelvis, 01/26/2021 MEDICATIONS: The patient is currently admitted to the hospital and receiving intravenous antibiotics. The antibiotics were administered within an appropriate time frame prior to the initiation of the procedure. ANESTHESIA/SEDATION: Moderate (conscious) sedation was employed during this procedure. A total of Versed 2 mg and Fentanyl 100 mcg was  administered intravenously. Moderate Sedation Time: 23 minutes. The patient's level of consciousness and vital signs were monitored continuously by radiology nursing throughout the procedure under my direct supervision. CONTRAST:  None COMPLICATIONS: None immediate. PROCEDURE: Informed  written consent was obtained from the patient after a discussion of the risks, benefits and alternatives to treatment. The patient was placed supine on the CT gantry and a pre procedural CT was performed re-demonstrating the known abscess/fluid collection within the left retroperitoneum. The procedure was planned. A timeout was performed prior to the initiation of the procedure. The left lower quadrant and flank was prepped and draped in the usual sterile fashion. The overlying soft tissues were anesthetized with 1% lidocaine with epinephrine. Appropriate trajectory was planned with the use of a 22 gauge spinal needle. An 18 gauge trocar needle was advanced into the abscess/fluid collection and a short Amplatz super stiff wire was coiled within the collection. Appropriate positioning was confirmed with a limited CT scan. The tract was serially dilated allowing placement of a 12 [place] all-purpose drainage catheter. Appropriate positioning was confirmed with a limited postprocedural CT scan. Forty ml of urine-colored fluid was aspirated. The tube was connected to a drainage bag and sutured in place. A dressing was placed. The patient tolerated the procedure well without immediate post procedural complication. IMPRESSION: Successful CT guided placement of a 22 French all purpose drain catheter into the left retroperitoneum with aspiration of 40 mL of urine-colored fluid. Samples were sent to the laboratory as requested by the ordering clinical team. Michaelle Birks, MD [facility] Electronically Signed   By: Michaelle Birks MD   On: 01/28/2021 16:17    Labs:  CBC: Recent Labs    01/28/21 0511 01/29/21 0331 01/30/21 0310 01/31/21 0500  WBC 17.5* 17.3* 16.3* 13.8*  HGB 8.9* 10.1* 8.8* 8.7*  HCT 28.7* 31.8* 28.4* 27.5*  PLT 362 415* 428* 463*     COAGS: Recent Labs    01/31/21 0500  INR 1.3*    BMP: Recent Labs    01/28/21 0511 01/29/21 0331  01/30/21 0310 01/31/21 0500  NA 130* 129* 132* 132*  K 3.7 3.9 3.9 4.0  CL 86* 85* 91* 91*  CO2 34* 33* 34* 35*  GLUCOSE 195* 204* 134* 147*  BUN 22 21 17 17   CALCIUM 7.6* 8.1* 7.7* 7.7*  CREATININE 1.01 0.98 0.79 0.76  GFRNONAA >60 >60 >60 >60     LIVER FUNCTION TESTS: Recent Labs    01/20/21 2039 01/29/21 0331 01/30/21 0310 01/31/21 0500  BILITOT 0.5 0.5 0.9 0.6  AST 37 129* 103* 79*  ALT 13 62* 45* 36  ALKPHOS 92 127* 116 115  PROT 5.1* 5.4* 5.1* 4.9*  ALBUMIN 1.7* 1.6* 1.5* 1.4*     Assessment and Plan:  70 yo male with history of lumbar spinal stenosis s/p lumbar fusions on multiple level on 04/18/2020.  History of abscess development near existing hardware s/p wound debridement by neurosurgery on 01/13/2021.  Currently admitted due to chest pain and anasarca, CT showed bilateral pleural effusion s/p chest tube placement by CCM and subsequently removed.  Repeat CT CAP on 01/26/2021 showed large left retroperitoneal fluid collection, patient is s/p aspiration and drain placement with IR on 01/27/2021. OP had been high, the drain changed from suction bulb to a gravity bag on 8/5.   Pt stable, drain intact, flushes and aspirates well. Puncture site unremarkable, no s/s of bleeding or infection.  OP 225 cc clear yellow  VSS WBC 13.8 today (16.3 yesterday) Cx NG x 3 days   Continue with flushing TID, output recording q shift and dressing changes as needed. Would consider additional imaging when output is less than 10 ml for 24 hours not including flush material.    Further treatment plan per PCCM Appreciate and agree with the plan.  IR to follow.    Electronically Signed: Tera Mater, PA-C 01/31/2021, 12:05 PM   I spent a total of 15 Minutes at the the patient's bedside AND on the patient's hospital floor or unit, greater than 50% of which was counseling/coordinating care for L RP drain

## 2021-01-31 NOTE — Progress Notes (Signed)
Neurosurgery Service Progress Note  Subjective: No acute events overnight. Continued right knee pain, no new complaints   Objective: Vitals:   01/31/21 0000 01/31/21 0400 01/31/21 0500 01/31/21 0713  BP: 135/66 139/73  134/63  Pulse:    79  Resp: 20 17  20   Temp: 98.4 F (36.9 C) 98.9 F (37.2 C)  98.2 F (36.8 C)  TempSrc: Oral Oral  Oral  SpO2:    95%  Weight:   121.2 kg     Physical Exam: Strength 5/5 except pain limited at the right knee, [id number]  Assessment & Plan: 70 y.o. man s/p large segment instrumented fusion w/ complex post-op infection.  -no change in neurosurgical plan of care  66  01/31/21 10:38 AM

## 2021-01-31 NOTE — Progress Notes (Signed)
PROGRESS NOTE   Marco Kennedy  EGB:[id number] DOB: [birth year] DOA: 01/20/2021 PCP: Maximiano Coss, MD  Brief Narrative:  70 year old home dwelling male Obesity BMI 41, OSA on BiPAP Index back surgery 04/18/2020 multistage procedure ALIFL4-L5, L5-L1 and then T10 to pelvis pedicle screw fixation with osteotomies and posterior lateral arthrodesis + autograft + allograft Recent admission 7/16 through 01/16/2021 with sudden onset flank pain + nausea vomiting generalized weakness subjective fever--CT lumbar spine = moderate size abscess located near spinal hardware underwent wound debridement 01/13/2021 and found to have E. coli-ID saw the patient patient was de-escalated to cefazolin for 6-week treatment with PICC line to be placed  Patient was discharged home but over the course of the 22nd through the 26 had gained 18 pounds weight associated with early satiety with ongoing chills and inability to lay flat because of pain in his back In [place] ED where he was first evaluated he was tachycardic tachypneic hypoxic to 88% requiring 4 L oxygen CT showed presumed empyema of the lungs and there was concern for discitis osteomyelitis of T10 with a WBC in the 19 range Ancef vancomycin were started Neurosurgery consulted, pulmonary consulted Neurosurgery did not initially feel any need for operative management  Eventually thought to have empyema in addition to psoas collection causing sepsis Developed urinary retention inability to notice he was passing stool-MRI back ordered and this was felt to be more impingement rather than mass-effect from expanding abscess  7/26 bilateral pigtail catheters performed 7/28 left chest tube removed 7/28 TTE no endocarditis-cardiology consulted for TEE 7/29 high-grade fevers despite antibiotics blood fluid cultures  7/30 right chest tube pulled 8/1 CT chest abdomen pelvis improving effusions but also showed 10 cm retroperitoneal collection 8/2 IR guided right  peritoneal JP drain placement yielding 8/3 decreased sensation lower extremities, incontinence urine inability to sense when passing stool, also new onset right knee effusion-orthopedics + neurosurgery consulted--MRI knee, MRI T/L-spine ordered 8/5 MRI knee pending  Hospital-Problem based course Prior E. 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 Imaging studied  in the next several days if continues to have large output per IR discretion Parapneumonic effusions?  Empyema Pulmonology recommends deferring further pleural procedures at this time Remains on 2 liters O2 Periodic CXR if PRN--has been stable Anasarca third spacing Probable hypervolemic hyponatremia contributing Serum osmolality = 279 2/2 poor nutrition-urine osmolality 441 urine sodium 95  -fluid restrict 1200 cc--Hold diuretics-improving  Transaminitis etiology 2/2 ancef Prior LFTs performed 7/26 were relatively normal and have currently normalized once again Hepatitis serologies  all negative  OSA on CPAP, morbid obesity BMI 42 Life-threatening in the setting of critical illness  DVT prophylaxis: Lovenox Code Status: Full Family Communication:   discussed with wife [name] on [place] [phone] on 8/6 Disposition:  Status is: Inpatient  Remains inpatient  appropriate because:Hemodynamically unstable, Ongoing diagnostic testing needed not appropriate for outpatient work up, and Unsafe d/c plan  Dispo: The patient is from: Home              Anticipated d/c is to: CIR  await insurances will not be before Tuesday              Patient currently is not medically stable to d/c.   Difficult to place patient No    Consultants:  Critical care medicine pulmonology Neurosurgery Infectious disease  Procedures: As above  Antimicrobials:    Currently on Ancef  Subjective:  Coherent pleasant, right knee continues to improve No stool several days--starting senna No fever Seems in good spirits--sleep good last pm   Objective: Vitals:   01/31/21 0400 01/31/21 0500 01/31/21 0713 01/31/21 1037  BP: 139/73  134/63 (!) 136/107  Pulse:   79 64  Resp: 17  20 19   Temp: 98.9 F (37.2 C)  98.2 F (36.8 C) 99 F (37.2 C)  TempSrc: Oral  Oral Oral  SpO2:   95% 97%  Weight:  121.2 kg      Intake/Output Summary (Last 24 hours) at 01/31/2021 1519 Last data filed at 01/31/2021 04/02/2021 Gross per 24 hour  Intake 443 ml  Output 1025 ml  Net -582 ml    Filed Weights   01/29/21 0500 01/30/21 0349 01/31/21 0500  Weight: 120.3 kg 121.2 kg 121.2 kg    Examination:  EOMI NCAT Mallampati 4 S1-S2 no murmur CTA B Abdomen soft obese nontender Straight leg raise less painful Drain is putting out serous yellow fluid and had to be changed twice today per patient  Data Reviewed: personally reviewed   Sodium 132 potassium 4.0 AST/ALT down from 1 29-1 03-->79, ALT 62-45-->36 bilirubin 0.6 Hepatitis panel negative WBC down from 17.3-16.3-->13  Radiology Studies: MR KNEE RIGHT W WO CONTRAST  Result Date: 01/30/2021 CLINICAL DATA:  Right knee pain for 3 days. EXAM: MRI OF THE RIGHT KNEE WITHOUT AND WITH CONTRAST TECHNIQUE: Multiplanar,  multisequence MR imaging of the knee was performed before and after the administration of intravenous contrast. CONTRAST:  58mL GADAVIST GADOBUTROL 1 MMOL/ML IV SOLN COMPARISON:  None. FINDINGS: Exam limited by patient motion. MENISCI Medial meniscus:  Degenerative changes without obvious tear. Lateral meniscus:  Probable chronic tear or prior meniscectomy. LIGAMENTS Cruciates:  Chronic ACL deficient knee.  PCL is intact. Collaterals:  Intact CARTILAGE Patellofemoral: Advanced degenerative chondrosis with joint space narrowing and spurring. Medial:  Moderate degenerative chondrosis. Lateral: Advanced degenerative chondrosis with full-thickness cartilage loss, joint space narrowing and spurring. Joint: There is a moderate to large joint effusion with moderate irregular surrounding synovial enhancement. Certainly could not exclude septic arthritis but this could also be non infectious synovitis. Recommend knee joint aspiration for further evaluation. Popliteal Fossa:  No popliteal mass or Baker's cyst. Extensor Mechanism: The patella retinacular structures are intact and the quadriceps and patellar tendons are intact. Bones: No findings suspicious for osteomyelitis. No marrow edema or abnormal enhancement. Other: Subcutaneous soft tissue swelling/edema could suggest cellulitis. There is also moderate edema like signal changes in the vastus medialis muscle and also in the posterior compartment muscles. Findings could suggest infectious myositis. No findings suspicious for pyomyositis. IMPRESSION: 1. Exam limited by patient motion. 2. Moderate to large joint effusion with moderate irregular surrounding synovial enhancement. Certainly could not exclude septic arthritis but this could also be non infectious synovitis. Recommend knee joint aspiration for further evaluation. 3. No findings suspicious for  osteomyelitis. 4. Probable cellulitis and myositis without discrete soft tissue abscess or pyomyositis. 5. Chronic ACL  deficient knee. 6. Tricompartmental degenerative changes most significant in the lateral. Electronically Signed   By: Rudie Meyer M.D.   On: 01/30/2021 20:18     Scheduled Meds:  Chlorhexidine Gluconate Cloth  6 each Topical Daily   colchicine  0.6 mg Oral Daily   enoxaparin (LOVENOX) injection  40 mg Subcutaneous Daily   feeding supplement  237 mL Oral TID BM   multivitamin with minerals  1 tablet Oral Daily   senna-docusate  1 tablet Oral QHS   sodium chloride flush  10-40 mL Intracatheter Q12H   sodium chloride flush  3 mL Intravenous Q12H   Continuous Infusions:   ceFAZolin (ANCEF) IV 2 g (01/31/21 1217)     LOS: 11 days   Time spent: 58  Rhetta Mura, MD [facility] To contact the attending provider between 7A-7P or the covering provider during after hours 7P-7A, please log into the web site [email or web address] and access using universal [place] password for that web site. If you do not have the password, please call the hospital operator.  01/31/2021, 3:19 PM

## 2021-02-01 DIAGNOSIS — J869 Pyothorax without fistula: Secondary | ICD-10-CM | POA: Diagnosis not present

## 2021-02-01 DIAGNOSIS — A419 Sepsis, unspecified organism: Secondary | ICD-10-CM | POA: Diagnosis not present

## 2021-02-01 DIAGNOSIS — A4151 Sepsis due to Escherichia coli [E. coli]: Secondary | ICD-10-CM

## 2021-02-01 DIAGNOSIS — G4733 Obstructive sleep apnea (adult) (pediatric): Secondary | ICD-10-CM | POA: Diagnosis not present

## 2021-02-01 DIAGNOSIS — M109 Gout, unspecified: Secondary | ICD-10-CM

## 2021-02-01 LAB — COMPREHENSIVE METABOLIC PANEL
ALT: 34 U/L (ref 0–44)
AST: 73 U/L — ABNORMAL HIGH (ref 15–41)
Albumin: 1.5 g/dL — ABNORMAL LOW (ref 3.5–5.0)
Alkaline Phosphatase: 107 U/L (ref 38–126)
Anion gap: 6 (ref 5–15)
BUN: 16 mg/dL (ref 8–23)
CO2: 34 mmol/L — ABNORMAL HIGH (ref 22–32)
Calcium: 7.9 mg/dL — ABNORMAL LOW (ref 8.9–10.3)
Chloride: 92 mmol/L — ABNORMAL LOW (ref 98–111)
Creatinine, Ser: 0.8 mg/dL (ref 0.61–1.24)
GFR, Estimated: >60 mL/min (ref >60)
Glucose, Bld: 140 mg/dL — ABNORMAL HIGH (ref 70–99)
Potassium: 4.2 mmol/L (ref 3.5–5.1)
Sodium: 132 mmol/L — ABNORMAL LOW (ref 135–145)
Total Bilirubin: 0.7 mg/dL (ref 0.3–1.2)
Total Protein: 5 g/dL — ABNORMAL LOW (ref 6.5–8.1)

## 2021-02-01 LAB — AEROBIC/ANAEROBIC CULTURE W GRAM STAIN (SURGICAL/DEEP WOUND)
Culture: NO GROWTH
Special Requests: NORMAL

## 2021-02-01 LAB — CBC WITH DIFFERENTIAL/PLATELET
Abs Immature Granulocytes: 0.12 10*3/uL — ABNORMAL HIGH (ref 0.00–0.07)
Basophils Absolute: 0.1 10*3/uL (ref 0.0–0.1)
Basophils Relative: 1 %
Eosinophils Absolute: 0.2 10*3/uL (ref 0.0–0.5)
Eosinophils Relative: 2 %
HCT: 28.4 % — ABNORMAL LOW (ref 39.0–52.0)
Hemoglobin: 9 g/dL — ABNORMAL LOW (ref 13.0–17.0)
Immature Granulocytes: 1 %
Lymphocytes Relative: 6 %
Lymphs Abs: 0.8 10*3/uL (ref 0.7–4.0)
MCH: 27.8 pg (ref 26.0–34.0)
MCHC: 31.7 g/dL (ref 30.0–36.0)
MCV: 87.7 fL (ref 80.0–100.0)
Monocytes Absolute: 1 10*3/uL (ref 0.1–1.0)
Monocytes Relative: 8 %
Neutro Abs: 10.7 10*3/uL — ABNORMAL HIGH (ref 1.7–7.7)
Neutrophils Relative %: 82 %
Platelets: 499 10*3/uL — ABNORMAL HIGH (ref 150–400)
RBC: 3.24 MIL/uL — ABNORMAL LOW (ref 4.22–5.81)
RDW: 13.2 % (ref 11.5–15.5)
WBC: 12.9 10*3/uL — ABNORMAL HIGH (ref 4.0–10.5)
nRBC: 0 % (ref 0.0–0.2)

## 2021-02-01 LAB — BODY FLUID CULTURE W GRAM STAIN: Culture: NO GROWTH

## 2021-02-01 LAB — CREATININE, FLUID (PLEURAL, PERITONEAL, JP DRAINAGE): Creat, Fluid: 0.7 mg/dL

## 2021-02-01 MED ORDER — SENNA 8.6 MG PO TABS
2.0000 | ORAL_TABLET | Freq: Once | ORAL | Status: AC
  Administered 2021-02-01: 17.2 mg via ORAL
  Filled 2021-02-01: qty 2

## 2021-02-01 NOTE — Progress Notes (Signed)
Neurosurgery Service Progress Note  Subjective: No acute events overnight. Sitting up in a chair this morning, which is an improvement for him, no new complaints.  Objective: Vitals:   02/01/21 0347 02/01/21 0500 02/01/21 0703 02/01/21 1028  BP: 131/67  134/68 132/78  Pulse:   80 91  Resp:   18 16  Temp: 98.9 F (37.2 C)  99.6 F (37.6 C) 98.7 F (37.1 C)  TempSrc: Oral  Oral Oral  SpO2:   93% 94%  Weight:  121.3 kg      Physical Exam: Strength 5/5 except pain limited at the right knee, SILTx4 Perc drainage bag w/ yellow thin fluid  Assessment & Plan: 70 y.o. man s/p large segment instrumented fusion w/ complex post-op infection.  -no change in neurosurgical plan of care -given the high output and appearance of the fluid, will send off a creatinine from his perc drain  Jadene Pierini  02/01/21 11:24 AM

## 2021-02-01 NOTE — Progress Notes (Signed)
PROGRESS NOTE    Marco Kennedy   ZOX:[id number]  DOB: 1951-06-26  DOA: 01/20/2021 PCP: [name], [name], MD   Brief Narrative:  Marco Kennedy is a 70 y/o male with morbid obesity, OSA on BipAP, s/p back surgery on 10/21, recent lumbar abscess s/p ORIF on 7/19 who grew E coli and was sent home with Ancef for 6 wks. He returned to the ED on 7/26 with chills and back pain and was found to be tachycardic, tachypnea, hypoxic with pulse ox of 88%, b/l empyema  T 10 osteomyelitis and started on Vanc. Later found to have a psoas abscess leading to urinary retention.  - 7/26> b/l pigtail catheters placed in pleural cavities - 7/28 left pleural catheter removed - @D  ECHO neg for endocarditis - 7/29 fevers - 7/30 right pleural catheter removed - 8/1 retroperitoneal fluid collection about 10 cm - 8/2 IR placed L JP drain - 8/3 urinary and fecal incontinence, decreased sensation in b/l LE, right knee effusion - 44ml pale yellow fluid obtained- found to be consistent with gout- Colchicine started  Subjective: No BM in many days. Has not gotten out of bed in 2 days. Appetite is fair. No pain.     Assessment & Plan:   Principal Problem:   Sepsis due to Escherichia coli (E. 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    Time spent in minutes: 35 DVT prophylaxis: enoxaparin (LOVENOX) injection 40 mg Start: 01/28/21 1000 SCDs Start: 01/20/21  1742  Code Status: full code Family Communication:  Level of Care: Level of care: Progressive Disposition Plan:  Status is: Inpatient  Remains inpatient appropriate because:IV treatments appropriate due to intensity of illness or inability to take PO  Dispo: The patient is from: Home              Anticipated d/c is to: CIR              Patient currently is not medically stable to d/c.   Difficult to place patient No      Consultants:  ID NS Procedures:    Antimicrobials:  Anti-infectives (From admission, onward)    Start     Dose/Rate Route Frequency Ordered Stop   01/24/21 2000  ceFAZolin (ANCEF) IVPB 2g/100 mL premix        2 g 200 mL/hr over 30 Minutes Intravenous Every 8 hours 01/24/21 1350     01/21/21 2200  vancomycin (VANCOCIN) IVPB 1000 mg/200 mL premix  Status:  Discontinued        1,000 mg 200 mL/hr over 60 Minutes Intravenous Every 12 hours 01/21/21 1131 01/22/21 1520   01/21/21 1030  vancomycin (VANCOREADY) IVPB 1250 mg/250 mL  Status:  Discontinued        1,250 mg 166.7 mL/hr over 90 Minutes Intravenous Every 12 hours 01/20/21 2209 01/21/21 1131   01/21/21 0230  ceFEPIme (MAXIPIME) 2 g in sodium chloride  0.9 % 100 mL IVPB  Status:  Discontinued        2 g 200 mL/hr over 30 Minutes Intravenous Every 8 hours 01/20/21 2210 01/24/21 1350   01/20/21 2230  vancomycin (VANCOREADY) IVPB 1750 mg/350 mL        1,750 mg 175 mL/hr over 120 Minutes Intravenous NOW 01/20/21 2208 01/21/21 2010   01/20/21 1845  ceFEPIme (MAXIPIME) 2 g in sodium chloride 0.9 % 100 mL IVPB        2 g 200 mL/hr over 30 Minutes Intravenous  Once 01/20/21 1749 01/21/21 1928   01/20/21 1845  vancomycin (VANCOCIN) IVPB 1000 mg/200 mL premix  Status:  Discontinued        1,000 mg 200 mL/hr over 60 Minutes Intravenous  Once 01/20/21 1749 01/20/21 2019        Objective: Vitals:   02/01/21 0347 02/01/21 0500 02/01/21 0703 02/01/21 1028  BP: 131/67  134/68 132/78  Pulse:   80 91  Resp:    18 16  Temp: 98.9 F (37.2 C)  99.6 F (37.6 C) 98.7 F (37.1 C)  TempSrc: Oral  Oral Oral  SpO2:   93% 94%  Weight:  121.3 kg      Intake/Output Summary (Last 24 hours) at 02/01/2021 1243 Last data filed at 02/01/2021 0800 Gross per 24 hour  Intake 133 ml  Output 1910 ml  Net -1777 ml   Filed Weights   01/30/21 0349 01/31/21 0500 02/01/21 0500  Weight: 121.2 kg 121.2 kg 121.3 kg    Examination: General exam: Appears comfortable  HEENT: PERRLA, oral mucosa moist, no sclera icterus or thrush Respiratory system: Clear to auscultation. Respiratory effort normal. Cardiovascular system: S1 & S2 heard, RRR.   Gastrointestinal system: Abdomen soft, non-tender, nondistended. Normal bowel sounds. Central nervous system: Alert and oriented. No focal neurological deficits. Extremities: No cyanosis, clubbing or edema- mild right knee tenderness Skin: No rashes or ulcers Psychiatry:  Mood & affect appropriate.     Data Reviewed: I have personally reviewed following labs and imaging studies  CBC: Recent Labs  Lab 01/28/21 0511 01/29/21 0331 01/30/21 0310 01/31/21 0500 02/01/21 0152  WBC 17.5* 17.3* 16.3* 13.8* 12.9*  NEUTROABS  --  14.9* 14.0* 11.7* 10.7*  HGB 8.9* 10.1* 8.8* 8.7* 9.0*  HCT 28.7* 31.8* 28.4* 27.5* 28.4*  MCV 88.3 88.3 89.0 89.0 87.7  PLT 362 415* 428* 463* 499*   Basic Metabolic Panel: Recent Labs  Lab 01/28/21 0511 01/29/21 0331 01/30/21 0310 01/31/21 0500 02/01/21 0152  NA 130* 129* 132* 132* 132*  K 3.7 3.9 3.9 4.0 4.2  CL 86* 85* 91* 91* 92*  CO2 34* 33* 34* 35* 34*  GLUCOSE 195* 204* 134* 147* 140*  BUN 22 21 17 17 16   CREATININE 1.01 0.98 0.79 0.76 0.80  CALCIUM 7.6* 8.1* 7.7* 7.7* 7.9*   GFR: Estimated Creatinine Clearance: 107.2 mL/min (by C-G formula based on SCr of 0.8 mg/dL). Liver Function Tests: Recent Labs  Lab 01/29/21 0331 01/30/21 0310 01/31/21 0500 02/01/21 0152  AST 129* 103* 79* 73*  ALT 62* 45* 36 34  ALKPHOS 127* 116  115 107  BILITOT 0.5 0.9 0.6 0.7  PROT 5.4* 5.1* 4.9* 5.0*  ALBUMIN 1.6* 1.5* 1.4* 1.5*   No results for input(s): LIPASE, AMYLASE in the last 168 hours. No results for input(s): AMMONIA in the last 168 hours. Coagulation Profile: Recent Labs  Lab 01/31/21 0500  INR 1.3*   Cardiac Enzymes: No results for input(s): CKTOTAL,  CKMB, CKMBINDEX, TROPONINI in the last 168 hours. BNP (last 3 results) No results for input(s): PROBNP in the last 8760 hours. HbA1C: No results for input(s): HGBA1C in the last 72 hours. CBG: No results for input(s): GLUCAP in the last 168 hours. Lipid Profile: No results for input(s): CHOL, HDL, LDLCALC, TRIG, CHOLHDL, LDLDIRECT in the last 72 hours. Thyroid Function Tests: No results for input(s): TSH, T4TOTAL, FREET4, T3FREE, THYROIDAB in the last 72 hours. Anemia Panel: No results for input(s): VITAMINB12, FOLATE, FERRITIN, TIBC, IRON, RETICCTPCT in the last 72 hours. Urine analysis:    Component Value Date/Time   COLORURINE YELLOW 01/21/2021 0740   APPEARANCEUR CLOUDY (A) 01/21/2021 0740   LABSPEC 1.024 01/21/2021 0740   PHURINE 5.0 01/21/2021 0740   GLUCOSEU NEGATIVE 01/21/2021 0740   HGBUR SMALL (A) 01/21/2021 0740   BILIRUBINUR NEGATIVE 01/21/2021 0740   KETONESUR NEGATIVE 01/21/2021 0740   PROTEINUR 30 (A) 01/21/2021 0740   UROBILINOGEN 0.2 11/13/2007 1336   NITRITE NEGATIVE 01/21/2021 0740   LEUKOCYTESUR NEGATIVE 01/21/2021 0740   Sepsis Labs: @LABRCNTIP (procalcitonin:4,lacticidven:4) ) Recent Results (from the past 240 hour(s))  Culture, blood (routine x 2)     Status: None   Collection Time: 01/23/21  9:52 PM   Specimen: BLOOD  Result Value Ref Range Status   Specimen Description BLOOD LEFT ANTECUBITAL  Final   Special Requests   Final    BOTTLES DRAWN AEROBIC AND ANAEROBIC Blood Culture adequate volume   Culture   Final    NO GROWTH 5 DAYS Performed at [facility], [address] [place], [place]    Report  Status 01/28/2021 FINAL  Final  Culture, blood (routine x 2)     Status: None   Collection Time: 01/23/21  9:52 PM   Specimen: BLOOD  Result Value Ref Range Status   Specimen Description BLOOD LEFT ANTECUBITAL  Final   Special Requests   Final    BOTTLES DRAWN AEROBIC AND ANAEROBIC Blood Culture adequate volume   Culture   Final    NO GROWTH 5 DAYS Performed at [facility], [address] [place], [place]    Report Status 01/28/2021 FINAL  Final  Culture, blood (routine x 2)     Status: None   Collection Time: 01/26/21  7:52 AM   Specimen: BLOOD  Result Value Ref Range Status   Specimen Description BLOOD RIGHT ANTECUBITAL  Final   Special Requests   Final    BOTTLES DRAWN AEROBIC AND ANAEROBIC Blood Culture adequate volume   Culture   Final    NO GROWTH 5 DAYS Performed at [facility], [address] [place], [place]    Report Status 01/31/2021 FINAL  Final  Culture, blood (routine x 2)     Status: None   Collection Time: 01/26/21  7:53 AM   Specimen: BLOOD  Result Value Ref Range Status   Specimen Description BLOOD RIGHT ANTECUBITAL  Final   Special Requests   Final    BOTTLES DRAWN AEROBIC AND ANAEROBIC Blood Culture adequate volume   Culture   Final    NO GROWTH 5 DAYS Performed at [facility], [address], [place], [place]    Report Status 01/31/2021 FINAL  Final  Aerobic/Anaerobic Culture w Gram Stain (surgical/deep wound)     Status: None (Preliminary result)   Collection Time: 01/27/21  5:25 PM   Specimen: Abscess  Result Value Ref Range Status  Specimen Description ABSCESS  Final   Special Requests Normal  Final   Gram Stain   Final    RARE WBC PRESENT,BOTH PMN AND MONONUCLEAR NO ORGANISMS SEEN    Culture   Final    NO GROWTH 4 DAYS NO ANAEROBES ISOLATED; CULTURE IN PROGRESS FOR 5 DAYS Performed at [facility], [address] [place], [place] [address]    Report Status PENDING  Incomplete   Body fluid culture w Gram Stain     Status: None   Collection Time: 01/28/21 12:57 PM   Specimen: Body Fluid  Result Value Ref Range Status   Specimen Description FLUID  Final   Special Requests SYNOVIAL RIGHT KNEE  Final   Gram Stain   Final    ABUNDANT WBC PRESENT, PREDOMINANTLY PMN NO ORGANISMS SEEN    Culture   Final    NO GROWTH 3 DAYS Performed at [facility], [address], [place], [place] [address]    Report Status 02/01/2021 FINAL  Final         Radiology Studies: MR KNEE RIGHT W WO CONTRAST  Result Date: 01/30/2021 CLINICAL DATA:  Right knee pain for 3 days. EXAM: MRI OF THE RIGHT KNEE WITHOUT AND WITH CONTRAST TECHNIQUE: Multiplanar, multisequence MR imaging of the knee was performed before and after the administration of intravenous contrast. CONTRAST:  10mL GADAVIST GADOBUTROL 1 MMOL/ML IV SOLN COMPARISON:  None. FINDINGS: Exam limited by patient motion. MENISCI Medial meniscus:  Degenerative changes without obvious tear. Lateral meniscus:  Probable chronic tear or prior meniscectomy. LIGAMENTS Cruciates:  Chronic ACL deficient knee.  PCL is intact. Collaterals:  Intact CARTILAGE Patellofemoral: Advanced degenerative chondrosis with joint space narrowing and spurring. Medial:  Moderate degenerative chondrosis. Lateral: Advanced degenerative chondrosis with full-thickness cartilage loss, joint space narrowing and spurring. Joint: There is a moderate to large joint effusion with moderate irregular surrounding synovial enhancement. Certainly could not exclude septic arthritis but this could also be non infectious synovitis. Recommend knee joint aspiration for further evaluation. Popliteal Fossa:  No popliteal mass or Baker's cyst. Extensor Mechanism: The patella retinacular structures are intact and the quadriceps and patellar tendons are intact. Bones: No findings suspicious for osteomyelitis. No marrow edema or abnormal enhancement. Other: Subcutaneous soft tissue  swelling/edema could suggest cellulitis. There is also moderate edema like signal changes in the vastus medialis muscle and also in the posterior compartment muscles. Findings could suggest infectious myositis. No findings suspicious for pyomyositis. IMPRESSION: 1. Exam limited by patient motion. 2. Moderate to large joint effusion with moderate irregular surrounding synovial enhancement. Certainly could not exclude septic arthritis but this could also be non infectious synovitis. Recommend knee joint aspiration for further evaluation. 3. No findings suspicious for osteomyelitis. 4. Probable cellulitis and myositis without discrete soft tissue abscess or pyomyositis. 5. Chronic ACL deficient knee. 6. Tricompartmental degenerative changes most significant in the lateral. Electronically Signed   By: Rudie Meyer M.D.   On: 01/30/2021 20:18      Scheduled Meds:  Chlorhexidine Gluconate Cloth  6 each Topical Daily   colchicine  0.6 mg Oral Daily   enoxaparin (LOVENOX) injection  40 mg Subcutaneous Daily   feeding supplement  237 mL Oral TID BM   multivitamin with minerals  1 tablet Oral Daily   sodium chloride flush  10-40 mL Intracatheter Q12H   sodium chloride flush  3 mL Intravenous Q12H   Continuous Infusions:   ceFAZolin (ANCEF) IV 2 g (02/01/21 1231)  LOS: 12 days      Calvert Cantor, MD [facility] Pager: [email or web address] 02/01/2021, 12:43 PM

## 2021-02-01 NOTE — Progress Notes (Signed)
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   Assessment RT knee crystal arthropathy ? Septic arthritis S/p Rt knee joint aspiration on 01/29/21, synovial fluid positive for calcium pyrophosphate crystals WBC 25, 000, N 98. CX no growth in 2 days with negative gram stain MRI Rt knee 8/5 moderate to large joint effusion with moderate irregular surrounding synovial enhancement, cannot exclude septic arthritis.  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 coli -Repeat MRI T L spine on 01/28/21 for concerns of urinary incontinence: Unchanged appearance of ventral epidural abscess extending from T7-T11 with likely source at the T10-11 disc space. Circumferential dural thickening at the T10 level again effaces the thecal sac with mass effect on the spinal cord//New abnormal disc signal at T9-10 and unchanged abnormal signal T10-11, consistent with discitis-osteomyelitis. -No plans for intervention by NeuroSx  Large left retroperitoneal fluid collection S/p drainage by OR on 8/2. Cx NG in 4 days  Bilateral pleural effusion,  exudative ( Possible empyema).  Right and left-sided pleural fluid culture no growth Status post left chest tube removal 7/29 and right chest tube removal 7/30 7/27 TTE with no evidence of endocarditis Blood cx 7/26 and 7/29 NO growth  Pulmonology and CT sx - no plans for surgical intervention  Transaminitis - downtrending  Recommendations Continue cefazolin as is. Will plan for 6 weeks from last MRI in 8/3 with repeat imaging of spine around the end of 6 weeks course. Tentative end date 03/11/21 PICC line in place Monitor CBC and BMP at least twice weekly  ESR and CRP weekly Seems patient is going to CIR for inpatient rehab.  If planned for discharge home, contact us for follow-up in the clinic ID will sign off for now.  Please call us back with questions  Rest of the management as per the primary team. Thank you for the consult. Please page with pertinent questions or concerns.  ______________________________________________________________________ Subjective patient seen and examined at the bedside.  Wife and son at bedside.  Discussed plan of care from ID standpoint.  Denies any fever, chills and sweats.  Has some nausea, pain in the right knee is improving.  Denies any abdominal pain vomiting or diarrhea.  Denies any cough chest pain or shortness of breath.    Vitals BP 132/78 (BP Location: Left Arm)   Pulse 91   Temp 98.7 F (37.1 C) (Oral)   Resp 16   Wt 121.3 kg   SpO2 94%   BMI 41.88 kg/m     Physical Exam Constitutional: Lying in the recliner and appears comfortable    Comments:   Cardiovascular:     Rate and Rhythm: Normal rate and regular rhythm.     Heart sounds:  Pulmonary:     Effort: on nasal  cannula    Comments:   Abdominal:     Palpations: Abdomen is soft.     Tenderness: Non tender and non distended, dtain in the LUQ of the abdomen with minimal purulent fluid in the bulb   Musculoskeletal:        General: Rt knee swelling and tenderness  improving. RT arm PICC line with no issues.   Skin:    Comments: No lesions or rashes   Neurological:     General: Grossly non focal   Psychiatric:        Mood and Affect: Mood normal.   Pertinent Microbiology Results for orders placed or performed during the hospital encounter of 01/20/21  MRSA Next Gen by PCR, Nasal     Status: None   Collection Time: 01/20/21  5:07 PM   Specimen: Nasal Mucosa; Nasal Swab  Result Value Ref Range Status   MRSA by PCR Next Gen NOT DETECTED NOT DETECTED Final    Comment: (NOTE) The GeneXpert MRSA Assay (FDA approved for NASAL specimens only), is one component of a comprehensive MRSA colonization surveillance program. It is not intended to diagnose MRSA infection nor to guide or monitor treatment for MRSA infections. Test performance is not FDA approved in patients less than 74 years old. Performed at [facility], [place] [address], [place], [place] [address]   Culture, body fluid w Gram Stain-bottle     Status: None   Collection Time: 01/20/21  6:30 PM   Specimen: Fluid  Result Value Ref Range Status   Specimen Description FLUID LEFT PLEURAL  Final   Special Requests   Final    BOTTLES DRAWN AEROBIC AND ANAEROBIC Blood Culture adequate volume   Culture   Final    NO GROWTH 5 DAYS Performed at [facility], [place] [address] [place], [place] [address]    Report Status 01/25/2021 FINAL  Final  Gram stain     Status: None   Collection Time: 01/20/21  6:30 PM   Specimen: Fluid  Result Value Ref Range Status   Specimen Description FLUID LEFT PLEURAL  Final   Special Requests NONE  Final   Gram Stain   Final    MODERATE WBC PRESENT, PREDOMINANTLY PMN NO ORGANISMS SEEN Performed at [facility], [place] [address], [place], [place] [address]    Report Status 01/21/2021 FINAL  Final  Culture, body fluid w Gram Stain-bottle     Status: None   Collection Time: 01/20/21  6:30 PM   Specimen: Fluid  Result Value Ref Range Status    Specimen Description FLUID RIGHT PLEURAL  Final   Special Requests   Final    BOTTLES DRAWN AEROBIC AND ANAEROBIC Blood Culture adequate volume   Culture   Final    NO GROWTH 5 DAYS Performed at [facility], [place] [address] [place], [place] [address]    Report Status 01/25/2021 FINAL  Final  Gram stain     Status: None   Collection Time: 01/20/21  6:30 PM   Specimen: Fluid  Result Value Ref Range Status   Specimen Description FLUID RIGHT PLEURAL  Final   Special Requests NONE  Final   Gram Stain   Final    ABUNDANT WBC PRESENT, PREDOMINANTLY PMN NO ORGANISMS SEEN Performed at [facility], [place] [address] [place], [place] [address]    Report Status 01/21/2021 FINAL  Final  Culture, blood (routine x 2)     Status: None  Collection Time: 01/20/21  8:39 PM   Specimen: BLOOD LEFT ARM  Result Value Ref Range Status   Specimen Description BLOOD LEFT ARM  Final   Special Requests   Final    BOTTLES DRAWN AEROBIC AND ANAEROBIC Blood Culture adequate volume   Culture   Final    NO GROWTH 5 DAYS Performed at [facility], [address] [place], [place] [address]    Report Status 01/25/2021 FINAL  Final  Culture, blood (routine x 2)     Status: None   Collection Time: 01/20/21  8:39 PM   Specimen: BLOOD LEFT HAND  Result Value Ref Range Status   Specimen Description BLOOD LEFT HAND  Final   Special Requests   Final    BOTTLES DRAWN AEROBIC AND ANAEROBIC Blood Culture adequate volume   Culture   Final    NO GROWTH 5 DAYS Performed at [facility], [place] [address], [place], [place] [address]    Report Status 01/25/2021 FINAL  Final  Urine Culture     Status: None   Collection Time: 01/21/21  7:31 AM   Specimen: Urine, Clean Catch  Result Value Ref Range Status   Specimen Description URINE, CLEAN CATCH  Final   Special Requests NONE  Final   Culture   Final    NO GROWTH Performed at [facility], [place] [address] [place], [place] [address]     Report Status 01/22/2021 FINAL  Final  Culture, blood (routine x 2)     Status: None   Collection Time: 01/23/21  9:52 PM   Specimen: BLOOD  Result Value Ref Range Status   Specimen Description BLOOD LEFT ANTECUBITAL  Final   Special Requests   Final    BOTTLES DRAWN AEROBIC AND ANAEROBIC Blood Culture adequate volume   Culture   Final    NO GROWTH 5 DAYS Performed at [facility], [place] [address] [place], [place] [address]    Report Status 01/28/2021 FINAL  Final  Culture, blood (routine x 2)     Status: None   Collection Time: 01/23/21  9:52 PM   Specimen: BLOOD  Result Value Ref Range Status   Specimen Description BLOOD LEFT ANTECUBITAL  Final   Special Requests   Final    BOTTLES DRAWN AEROBIC AND ANAEROBIC Blood Culture adequate volume   Culture   Final    NO GROWTH 5 DAYS Performed at [facility], [place] [address], [place], [place] [address]    Report Status 01/28/2021 FINAL  Final  Culture, blood (routine x 2)     Status: None   Collection Time: 01/26/21  7:52 AM   Specimen: BLOOD  Result Value Ref Range Status   Specimen Description BLOOD RIGHT ANTECUBITAL  Final   Special Requests   Final    BOTTLES DRAWN AEROBIC AND ANAEROBIC Blood Culture adequate volume   Culture   Final    NO GROWTH 5 DAYS Performed at [facility], [place] [address], [place], [place] [address]    Report Status 01/31/2021 FINAL  Final  Culture, blood (routine x 2)     Status: None   Collection Time: 01/26/21  7:53 AM   Specimen: BLOOD  Result Value Ref Range Status   Specimen Description BLOOD RIGHT ANTECUBITAL  Final   Special Requests   Final    BOTTLES DRAWN AEROBIC AND ANAEROBIC Blood Culture adequate volume   Culture   Final  NO GROWTH 5 DAYS Performed at [facility], [place] [address] [place], [place] [address]    Report Status 01/31/2021 FINAL  Final  Aerobic/Anaerobic Culture w Gram Stain (surgical/deep wound)     Status: None (Preliminary result)    Collection Time: 01/27/21  5:25 PM   Specimen: Abscess  Result Value Ref Range Status   Specimen Description ABSCESS  Final   Special Requests Normal  Final   Gram Stain   Final    RARE WBC PRESENT,BOTH PMN AND MONONUCLEAR NO ORGANISMS SEEN    Culture   Final    NO GROWTH 4 DAYS NO ANAEROBES ISOLATED; CULTURE IN PROGRESS FOR 5 DAYS Performed at [facility], [place] [address], [place], [place] [address]    Report Status PENDING  Incomplete  Body fluid culture w Gram Stain     Status: None (Preliminary result)   Collection Time: 01/28/21 12:57 PM   Specimen: Body Fluid  Result Value Ref Range Status   Specimen Description FLUID  Final   Special Requests SYNOVIAL RIGHT KNEE  Final   Gram Stain   Final    ABUNDANT WBC PRESENT, PREDOMINANTLY PMN NO ORGANISMS SEEN    Culture   Final    NO GROWTH 2 DAYS Performed at [facility], [address], [place], [place] [address]    Report Status PENDING  Incomplete     Pertinent Lab. CBC Latest Ref Rng & Units 02/01/2021 01/31/2021 01/30/2021  WBC 4.0 - 10.5 K/uL 12.9(H) 13.8(H) 16.3(H)  Hemoglobin 13.0 - 17.0 g/dL 9.0(L) 8.7(L) 8.8(L)  Hematocrit 39.0 - 52.0 % 28.4(L) 27.5(L) 28.4(L)  Platelets 150 - 400 K/uL 499(H) 463(H) 428(H)   CMP Latest Ref Rng & Units 02/01/2021 01/31/2021 01/30/2021  Glucose 70 - 99 mg/dL 140(H) 147(H) 134(H)  BUN 8 - 23 mg/dL 16 17 17   Creatinine 0.61 - 1.24 mg/dL 0.80 0.76 0.79  Sodium 135 - 145 mmol/L 132(L) 132(L) 132(L)  Potassium 3.5 - 5.1 mmol/L 4.2 4.0 3.9  Chloride 98 - 111 mmol/L 92(L) 91(L) 91(L)  CO2 22 - 32 mmol/L 34(H) 35(H) 34(H)  Calcium 8.9 - 10.3 mg/dL 7.9(L) 7.7(L) 7.7(L)  Total Protein 6.5 - 8.1 g/dL 5.0(L) 4.9(L) 5.1(L)  Total Bilirubin 0.3 - 1.2 mg/dL 0.7 0.6 0.9  Alkaline Phos 38 - 126 U/L 107 115 116  AST 15 - 41 U/L 73(H) 79(H) 103(H)  ALT 0 - 44 U/L 34 36 45(H)     Pertinent Imaging today Plain films and CT images have been personally visualized and interpreted; radiology  reports have been reviewed. Decision making incorporated into the Impression / Recommendations.  MRI T L spine 01/28/21 IMPRESSION: 1. 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 Circumferential dural thickening at the T10 level again effaces the thecal sac with mass effect on the spinal cord. 2. New abnormal disc signal at T9-10 and unchanged abnormal signal T10-11, consistent with discitis-osteomyelitis. 3. No lumbar spinal canal stenosis. 4. Loculated bilateral pleural collections.  [place] RT knee joint 01/28/21 IMPRESSION: Subcutaneous edema with large complex fluid collection at the anterior knee superior to the patella which may reflect infected or inflammatory fluid collection, or potentially hematoma. This would be better characterized by MRI.  MRI rt knee 01/30/21 IMPRESSION: 1. Exam limited by patient motion. 2. Moderate to large joint effusion with moderate irregular surrounding synovial enhancement. Certainly could not exclude septic arthritis but this could also be non  infectious synovitis. Recommend knee joint aspiration for further evaluation. 3. No findings suspicious for osteomyelitis. 4. Probable cellulitis and myositis without discrete soft tissue abscess or pyomyositis. 5. Chronic ACL deficient knee. 6. Tricompartmental degenerative changes most significant in the lateral.    I spent more than 35 minutes for this patient encounter including review of prior medical records, coordination of care  with greater than 50% of time being face to face/counseling and discussing diagnostics/treatment plan with the patient/family.  Electronically signed by:   Rosiland Oz, MD Infectious Disease Physician [facility] for Infectious Disease Pager: [phone]

## 2021-02-02 ENCOUNTER — Other Ambulatory Visit: Payer: Self-pay | Admitting: Radiology

## 2021-02-02 DIAGNOSIS — R7881 Bacteremia: Secondary | ICD-10-CM

## 2021-02-02 DIAGNOSIS — K651 Peritoneal abscess: Secondary | ICD-10-CM

## 2021-02-02 DIAGNOSIS — L0291 Cutaneous abscess, unspecified: Secondary | ICD-10-CM

## 2021-02-02 DIAGNOSIS — J869 Pyothorax without fistula: Secondary | ICD-10-CM | POA: Diagnosis not present

## 2021-02-02 DIAGNOSIS — M109 Gout, unspecified: Secondary | ICD-10-CM | POA: Diagnosis not present

## 2021-02-02 MED ORDER — SODIUM CHLORIDE 0.9 % IV SOLN
INTRAVENOUS | Status: DC | PRN
  Administered 2021-02-02: 250 mL via INTRAVENOUS

## 2021-02-02 NOTE — Progress Notes (Signed)
Physical Therapy Treatment Patient Details Name: Marco Kennedy MRN: [id number] DOB: May 10, 1951 Today's Date: 02/02/2021    History of Present Illness Pt is a 70 y.o. male who presented 01/20/21 with SOB, increased weight gain, back pain, and fever. Of note, recent admission from 7/16-22 for sepsis secondary to moderate-sized abscess near the pelvic hardware. CT of the chest show empyema as well as discitis osteomyelitis of T10. S/p bil chest tube placement 7/26, L chest tube removed 7/29. L JP drain insertion 8/2. S/p Right knee aspiration and injection 8/4. PMH: back surgery with pelvis fixation in 03/2020, OSA on BIPAP, arthritis.    PT Comments    Pt making excellent progress towards his physical therapy goals, exhibiting improved activity tolerance and ambulation distance. Pt also reports decreased right knee pain. Pt able to participate in seated warm up exercise and then ambulating 40 ft + 20 ft with a seated rest break in between. Pt requiring up to two person moderate assist for transfers and min assist for ambulation (+2 for chair follow). SpO2 > 90% on 2L O2, HR peak 149 bpm. Continue to recommend CIR to address deficits and maximize functional independence.     Follow Up Recommendations  CIR;Supervision/Assistance - 24 hour     Equipment Recommendations  Wheelchair (measurements PT);Wheelchair cushion (measurements PT)    Recommendations for Other Services       Precautions / Restrictions Precautions Precautions: Fall Precaution Comments: monitor SpO2; L drain Restrictions Weight Bearing Restrictions: No    Mobility  Bed Mobility Overal bed mobility: Needs Assistance Bed Mobility: Sit to Sidelying Rolling: Mod assist         General bed mobility comments: ModA for BLE management back into bed    Transfers Overall transfer level: Needs assistance Equipment used: Rolling walker (2 wheeled) Transfers: Sit to/from Stand Sit to Stand: +2 physical assistance;Mod  assist         General transfer comment: ModA + 2 to rise to stand from recliner x 2. Cues for scooting to edge, hand placement, rocking to gain momentum, "nose over toes."  Ambulation/Gait Ambulation/Gait assistance: Min assist;+2 safety/equipment Gait Distance (Feet): 60 Feet (40", 20") Assistive device: Rolling walker (2 wheeled) Gait Pattern/deviations: Step-through pattern;Decreased stride length;Trunk flexed;Shuffle;Decreased stance time - right Gait velocity: reduced   General Gait Details: Pt with short, shuffling gait pattern, increased trunk/hip flexion, decreased bilateral foot clearance. MinA for balance, cues for activity pacing, close chair follow. Ambulating 40 ft then an additional 20 ft with seated rest break in between   Stairs             Wheelchair Mobility    Modified Rankin (Stroke Patients Only)       Balance Overall balance assessment: Needs assistance Sitting-balance support: Feet supported;No upper extremity supported Sitting balance-Leahy Scale: Fair     Standing balance support: Bilateral upper extremity supported;During functional activity Standing balance-Leahy Scale: Poor Standing balance comment: Reliant on UE support on RW and physical assistance.                            Cognition Arousal/Alertness: Awake/alert Behavior During Therapy: WFL for tasks assessed/performed Overall Cognitive Status: Within Functional Limits for tasks assessed                                        Exercises General Exercises - Lower  Extremity Ankle Circles/Pumps: Both;Seated;AROM;20 reps Long Arc Quad: AROM;Both;10 reps;Seated Hip Flexion/Marching: AROM;Both;10 reps;Seated Heel Raises: Both;20 reps;Seated    General Comments        Pertinent Vitals/Pain Pain Assessment: Faces Faces Pain Scale: Hurts little more Pain Location: R knee, back Pain Descriptors / Indicators: Discomfort;Grimacing;Guarding Pain  Intervention(s): Limited activity within patient's tolerance;Monitored during session    Home Living                      Prior Function            PT Goals (current goals can now be found in the care plan section) Acute Rehab PT Goals Patient Stated Goal: to walk PT Goal Formulation: With patient Time For Goal Achievement: 02/06/21 Potential to Achieve Goals: Good Progress towards PT goals: Progressing toward goals    Frequency    Min 3X/week      PT Plan Current plan remains appropriate    Co-evaluation              AM-PAC PT "6 Clicks" Mobility   Outcome Measure  Help needed turning from your back to your side while in a flat bed without using bedrails?: A Little Help needed moving from lying on your back to sitting on the side of a flat bed without using bedrails?: A Little Help needed moving to and from a bed to a chair (including a wheelchair)?: A Little Help needed standing up from a chair using your arms (e.g., wheelchair or bedside chair)?: A Lot Help needed to walk in hospital room?: A Little Help needed climbing 3-5 steps with a railing? : Total 6 Click Score: 15    End of Session Equipment Utilized During Treatment: Gait belt;Oxygen Activity Tolerance: Patient tolerated treatment well Patient left: in bed;with call bell/phone within reach;with bed alarm set Nurse Communication: Mobility status PT Visit Diagnosis: Muscle weakness (generalized) (M62.81);Other abnormalities of gait and mobility (R26.89);Unsteadiness on feet (R26.81);Difficulty in walking, not elsewhere classified (R26.2);Pain Pain - Right/Left: Right Pain - part of body: Knee     Time: 1403-1430 PT Time Calculation (min) (ACUTE ONLY): 27 min  Charges:  $Gait Training: 8-22 mins $Therapeutic Activity: 8-22 mins                     [name], PT, DPT Acute Rehabilitation Services Pager [phone] Office [phone]    [name] 02/02/2021, 3:15 PM

## 2021-02-02 NOTE — Progress Notes (Signed)
Subjective: Patient reports much better in terms of pain.  Right knee pain also improved  Objective: Vital signs in last 24 hours: Temp:  [98.5 F (36.9 C)-99 F (37.2 C)] 98.9 F (37.2 C) (08/08 0755) Pulse Rate:  [89] 89 (08/07 1509) Resp:  [20] 20 (08/08 0000) BP: (142-157)/(67-77) 148/77 (08/08 0755) SpO2:  [97 %] 97 % (08/07 1509) Weight:  [121.3 kg] 121.3 kg (08/08 0455)  Intake/Output from previous day: 08/07 0701 - 08/08 0700 In: 638 [P.O.:545; I.V.:43] Out: 1971 [Urine:1850; Drains:121] Intake/Output this shift: No intake/output data recorded.  Physical Exam: Full strength both legs.  Denies numbness  Lab Results: Recent Labs    01/31/21 0500 02/01/21 0152  WBC 13.8* 12.9*  HGB 8.7* 9.0*  HCT 27.5* 28.4*  PLT 463* 499*   BMET Recent Labs    01/31/21 0500 02/01/21 0152  NA 132* 132*  K 4.0 4.2  CL 91* 92*  CO2 35* 34*  GLUCOSE 147* 140*  BUN 17 16  CREATININE 0.76 0.80  CALCIUM 7.7* 7.9*    Studies/Results: No results found.  Assessment/Plan: Patient is improving.  Work on bowels and mobility.  Awaiting Rehab.    LOS: 13 days    [name], MD 02/02/2021, 10:51 AM

## 2021-02-02 NOTE — Progress Notes (Signed)
Referring Physician(s): Dr. Levonne Spiller  Supervising Physician: Corrie Mckusick  Patient Status:  [facility] - In-pt  Chief Complaint:  Left retroperitoneal abscess s/p abscess drain placement on 8.2.22 - 12 Fr with aspiration of 40 ml of urine colored fluid by Dr. Maryelizabeth Kaufmann   Subjective:  Patient states he is feeling . He is awaiting placement in skilled rehabilitation.   Allergies: Patient has no known allergies.  Medications: Prior to Admission medications   Medication Sig Start Date End Date Taking? Authorizing Provider  ceFAZolin (ANCEF) IVPB Inject 2 g into the vein every 8 (eight) hours. Indication:  vertebral osteomyelitis First Dose: No Last Day of Therapy:  02/24/2021 Labs - Once weekly:  CBC/D and BMP, Labs - Every other week:  ESR and CRP Method of administration: IV Push Method of administration may be changed at the discretion of home infusion pharmacist based upon assessment of the patient and/or caregiver's ability to self-administer the medication ordered. 01/15/21 02/24/21  Marvis Moeller, NP  cholecalciferol (VITAMIN D3) 25 MCG (1000 UNIT) tablet Take 1,000 Units by mouth daily.    [provider]  diclofenac (VOLTAREN) 75 MG EC tablet Take 75 mg by mouth 2 (two) times daily. 11/13/20   [provider]  Multiple Vitamin (MULTIVITAMIN WITH MINERALS) TABS tablet Take 1 tablet by mouth daily.    [provider]  oxyCODONE (OXY IR/ROXICODONE) 5 MG immediate release tablet Take 1 tablet (5 mg total) by mouth every 6 (six) hours as needed for moderate pain. 01/15/21   Little Ishikawa, MD  polyethylene glycol (MIRALAX / GLYCOLAX) 17 g packet Take 17 g by mouth 2 (two) times daily. 01/15/21   Little Ishikawa, MD     Vital Signs: BP (!) 148/77 (BP Location: Right Arm)   Pulse 89   Temp 98.9 F (37.2 C) (Oral)   Resp 20   Wt 267 lb 6.7 oz (121.3 kg)   SpO2 97%   BMI 41.88 kg/m   Physical Exam Vitals and nursing note reviewed.   Constitutional:      Appearance: He is well-developed.  HENT:     Head: Normocephalic.  Pulmonary:     Effort: Pulmonary effort is normal.  Abdominal:     Comments: Positive right flank drain to gravity bag. Site is unremarkable with no erythema, edema, tenderness, bleeding or drainage noted at exit site. Suture and stat lock in place. Dressing is clean dry and intact. 50 ml of  straw colored fluid noted in gravity bag. Drain is able to be flushed easily.   Musculoskeletal:        General: Normal range of motion.     Cervical back: Normal range of motion.  Skin:    General: Skin is dry.  Neurological:     Mental Status: He is alert and oriented to person, place, and time.    Imaging: MR KNEE RIGHT W WO CONTRAST  Result Date: 01/30/2021 CLINICAL DATA:  Right knee pain for 3 days. EXAM: MRI OF THE RIGHT KNEE WITHOUT AND WITH CONTRAST TECHNIQUE: Multiplanar, multisequence MR imaging of the knee was performed before and after the administration of intravenous contrast. CONTRAST:  32m GADAVIST GADOBUTROL 1 MMOL/ML IV SOLN COMPARISON:  None. FINDINGS: Exam limited by patient motion. MENISCI Medial meniscus:  Degenerative changes without obvious tear. Lateral meniscus:  Probable chronic tear or prior meniscectomy. LIGAMENTS Cruciates:  Chronic ACL deficient knee.  PCL is intact. Collaterals:  Intact CARTILAGE Patellofemoral: Advanced degenerative chondrosis with joint space  narrowing and spurring. Medial:  Moderate degenerative chondrosis. Lateral: Advanced degenerative chondrosis with full-thickness cartilage loss, joint space narrowing and spurring. Joint: There is a moderate to large joint effusion with moderate irregular surrounding synovial enhancement. Certainly could not exclude septic arthritis but this could also be non infectious synovitis. Recommend knee joint aspiration for further evaluation. Popliteal Fossa:  No popliteal mass or Baker's cyst. Extensor Mechanism: The patella retinacular  structures are intact and the quadriceps and patellar tendons are intact. Bones: No findings suspicious for osteomyelitis. No marrow edema or abnormal enhancement. Other: Subcutaneous soft tissue swelling/edema could suggest cellulitis. There is also moderate edema like signal changes in the vastus medialis muscle and also in the posterior compartment muscles. Findings could suggest infectious myositis. No findings suspicious for pyomyositis. IMPRESSION: 1. Exam limited by patient motion. 2. Moderate to large joint effusion with moderate irregular surrounding synovial enhancement. Certainly could not exclude septic arthritis but this could also be non infectious synovitis. Recommend knee joint aspiration for further evaluation. 3. No findings suspicious for osteomyelitis. 4. Probable cellulitis and myositis without discrete soft tissue abscess or pyomyositis. 5. Chronic ACL deficient knee. 6. Tricompartmental degenerative changes most significant in the lateral. Electronically Signed   By: Marijo Sanes M.D.   On: 01/30/2021 20:18    Labs:  CBC: Recent Labs    01/29/21 0331 01/30/21 0310 01/31/21 0500 02/01/21 0152  WBC 17.3* 16.3* 13.8* 12.9*  HGB 10.1* 8.8* 8.7* 9.0*  HCT 31.8* 28.4* 27.5* 28.4*  PLT 415* 428* 463* 499*    COAGS: Recent Labs    01/31/21 0500  INR 1.3*    BMP: Recent Labs    01/29/21 0331 01/30/21 0310 01/31/21 0500 02/01/21 0152  NA 129* 132* 132* 132*  K 3.9 3.9 4.0 4.2  CL 85* 91* 91* 92*  CO2 33* 34* 35* 34*  GLUCOSE 204* 134* 147* 140*  BUN 21 17 17 16   CALCIUM 8.1* 7.7* 7.7* 7.9*  CREATININE 0.98 0.79 0.76 0.80  GFRNONAA >60 >60 >60 >60    LIVER FUNCTION TESTS: Recent Labs    01/29/21 0331 01/30/21 0310 01/31/21 0500 02/01/21 0152  BILITOT 0.5 0.9 0.6 0.7  AST 129* 103* 79* 73*  ALT 62* 45* 36 34  ALKPHOS 127* 116 115 107  PROT 5.4* 5.1* 4.9* 5.0*  ALBUMIN 1.6* 1.5* 1.4* 1.5*    Assessment and Plan:  Drain Location:  Size: Fr size:  12 Fr Date of placement: 8.2.22  Currently to: Drain collection device: gravity changed from JP drain on 8.5 due to high output 24 hour output:  Output by Drain (mL) 01/31/21 0701 - 01/31/21 1900 01/31/21 1901 - 02/01/21 0700 02/01/21 0701 - 02/01/21 1900 02/01/21 1901 - 02/02/21 0700 02/02/21 0701 - 02/02/21 1321  Closed System Drain 1 Left Flank Bulb (JP) 12 Fr. 110 100 21 100     Interval imaging/drain manipulation:  None since placement  Current examination: Flushes/aspirates easily.  Insertion site unremarkable. Suture and stat lock in place. Dressed appropriately.  WBC is 12.9. Cultures show no growth are wbc present both PMN and mononuclear. Cr 0.7   Plan: Continue TID flushes with 5 cc NS. Record output Q shift. Dressing changes QD or PRN if soiled.  Call IR APP or on call IR MD if difficulty flushing or sudden change in drain output.  Repeat imaging/possible drain injection once output < 10 mL/QD (excluding flush material.)  Discharge planning: Please contact IR APP or on call IR MD prior to patient d/c  to ensure appropriate follow up plans are in place. Typically patient will follow up with IR clinic 10-14 days post d/c for repeat imaging/possible drain injection. IR scheduler will contact patient with date/time of appointment. Patient will need to flush drain QD with 5 cc NS, record output QD, dressing changes every 2-3 days or earlier if soiled.   IR will continue to follow - please call with questions or concerns. OP orders placed at this time.    Electronically Signed: Jacqualine Mau, NP 02/02/2021, 1:20 PM   I spent a total of 15 Minutes at the patient's bedside AND on the patient's hospital floor or unit, greater than 50% of which was counseling/coordinating care for retroperitoneal abscess drain.

## 2021-02-02 NOTE — Progress Notes (Signed)
Patient refused home BIPAP. Equipment set up at bedside if needed. Patient continues on 2L [place]. Spo2 97%

## 2021-02-02 NOTE — Progress Notes (Signed)
PROGRESS NOTE    Marco Kennedy   DVV:[id number]  DOB: Aug 02, 1950  DOA: 01/20/2021 PCP: Maximiano Coss, MD   Brief Narrative:  Marco Kennedy is a 70 y/o male with morbid obesity, OSA on BipAP, s/p back surgery on 10/21, recent lumbar abscess s/p ORIF on 7/19 who grew E coli and was sent home with Ancef for 6 wks. He returned to the ED on 7/26 with chills and back pain and was found to be tachycardic, tachypnea, hypoxic with pulse ox of 88%, b/l empyema  T 10 osteomyelitis and started on Vanc. Later found to have a psoas abscess leading to urinary retention.  - 7/26> b/l pigtail catheters placed in pleural cavities - 7/28 left pleural catheter removed - @D  ECHO neg for endocarditis - 7/29 fevers - 7/30 right pleural catheter removed - 8/1 retroperitoneal fluid collection about 10 cm - 8/2 IR placed L JP drain - 8/3 urinary and fecal incontinence, decreased sensation in b/l LE, right knee effusion - 46ml pale yellow fluid obtained- found to be consistent with gout- Colchicine started  Subjective: The patient was able to have a BM yesterday.  He also sat up in a chair yesterday.  He has no other complaints and is waiting patiently for CIR    Assessment & Plan:   Principal Problem:   Sepsis due to Escherichia coli (E. 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  Constipation - Continue MiraLAX  Time spent in minutes: 35 DVT prophylaxis: enoxaparin (LOVENOX) injection 40 mg Start: 01/28/21 1000 SCDs Start: 01/20/21 1742  Code Status: full code Family Communication:  Level of Care: Level of care: Progressive Disposition Plan:  Status is: Inpatient  Remains inpatient appropriate because:IV treatments appropriate due to intensity of illness or inability to take PO  Dispo: The patient is from: Home              Anticipated d/c is to: CIR              Patient currently is not medically stable to d/c.   Difficult to place patient No      Consultants:  ID NS Procedures:    Antimicrobials:  Anti-infectives (From admission, onward)    Start     Dose/Rate Route Frequency Ordered Stop   01/24/21 2000  ceFAZolin (ANCEF) IVPB 2g/100 mL premix        2 g 200 mL/hr over 30 Minutes Intravenous Every 8 hours 01/24/21 1350     01/21/21 2200  vancomycin (VANCOCIN) IVPB 1000 mg/200 mL premix  Status:  Discontinued        1,000 mg 200 mL/hr over 60 Minutes Intravenous Every 12 hours 01/21/21 1131 01/22/21 1520   01/21/21 1030  vancomycin (VANCOREADY) IVPB 1250 mg/250 mL  Status:  Discontinued  1,250 mg 166.7 mL/hr over 90 Minutes Intravenous Every 12 hours 01/20/21 2209 01/21/21 1131   01/21/21 0230  ceFEPIme (MAXIPIME) 2 g in sodium chloride 0.9 % 100 mL IVPB  Status:  Discontinued        2 g 200 mL/hr over 30 Minutes Intravenous Every 8 hours 01/20/21 2210 01/24/21 1350   01/20/21 2230  vancomycin (VANCOREADY) IVPB 1750 mg/350 mL        1,750 mg 175 mL/hr over 120 Minutes Intravenous NOW 01/20/21 2208 01/21/21 2010   01/20/21 1845  ceFEPIme (MAXIPIME) 2 g in sodium chloride 0.9 % 100 mL IVPB        2 g 200 mL/hr over 30 Minutes Intravenous  Once 01/20/21 1749 01/21/21 1928   01/20/21 1845  vancomycin (VANCOCIN) IVPB 1000 mg/200 mL premix  Status:  Discontinued        1,000 mg 200 mL/hr over 60 Minutes Intravenous  Once  01/20/21 1749 01/20/21 2019        Objective: Vitals:   02/02/21 0000 02/02/21 0400 02/02/21 0455 02/02/21 0755  BP: (!) 146/67 (!) 151/75  (!) 148/77  Pulse:      Resp: 20     Temp: 98.9 F (37.2 C) 98.5 F (36.9 C)  98.9 F (37.2 C)  TempSrc: Oral Oral  Oral  SpO2:      Weight:   121.3 kg     Intake/Output Summary (Last 24 hours) at 02/02/2021 1252 Last data filed at 02/02/2021 0401 Gross per 24 hour  Intake 403 ml  Output 1971 ml  Net -1568 ml    Filed Weights   01/31/21 0500 02/01/21 0500 02/02/21 0455  Weight: 121.2 kg 121.3 kg 121.3 kg    Examination: General exam: Appears comfortable  HEENT: PERRLA, oral mucosa moist, no sclera icterus or thrush Respiratory system: Clear to auscultation. Respiratory effort normal. Cardiovascular system: S1 & S2 heard, regular rate and rhythm Gastrointestinal system: Abdomen soft, non-tender, nondistended. Normal bowel sounds   Central nervous system: Alert and oriented. No focal neurological deficits. Extremities: No cyanosis, clubbing or edema Skin: No rashes or ulcers Psychiatry:  Mood & affect appropriate.      Data Reviewed: I have personally reviewed following labs and imaging studies  CBC: Recent Labs  Lab 01/28/21 0511 01/29/21 0331 01/30/21 0310 01/31/21 0500 02/01/21 0152  WBC 17.5* 17.3* 16.3* 13.8* 12.9*  NEUTROABS  --  14.9* 14.0* 11.7* 10.7*  HGB 8.9* 10.1* 8.8* 8.7* 9.0*  HCT 28.7* 31.8* 28.4* 27.5* 28.4*  MCV 88.3 88.3 89.0 89.0 87.7  PLT 362 415* 428* 463* 499*    Basic Metabolic Panel: Recent Labs  Lab 01/28/21 0511 01/29/21 0331 01/30/21 0310 01/31/21 0500 02/01/21 0152  NA 130* 129* 132* 132* 132*  K 3.7 3.9 3.9 4.0 4.2  CL 86* 85* 91* 91* 92*  CO2 34* 33* 34* 35* 34*  GLUCOSE 195* 204* 134* 147* 140*  BUN 22 21 17 17 16   CREATININE 1.01 0.98 0.79 0.76 0.80  CALCIUM 7.6* 8.1* 7.7* 7.7* 7.9*    GFR: Estimated Creatinine Clearance: 107.2 mL/min (by C-G formula based on SCr of 0.8  mg/dL). Liver Function Tests: Recent Labs  Lab 01/29/21 0331 01/30/21 0310 01/31/21 0500 02/01/21 0152  AST 129* 103* 79* 73*  ALT 62* 45* 36 34  ALKPHOS 127* 116 115 107  BILITOT 0.5 0.9 0.6 0.7  PROT 5.4* 5.1* 4.9* 5.0*  ALBUMIN 1.6* 1.5* 1.4* 1.5*    No results for input(s): LIPASE, AMYLASE in  the last 168 hours. No results for input(s): AMMONIA in the last 168 hours. Coagulation Profile: Recent Labs  Lab 01/31/21 0500  INR 1.3*    Cardiac Enzymes: No results for input(s): CKTOTAL, CKMB, CKMBINDEX, TROPONINI in the last 168 hours. BNP (last 3 results) No results for input(s): PROBNP in the last 8760 hours. HbA1C: No results for input(s): HGBA1C in the last 72 hours. CBG: No results for input(s): GLUCAP in the last 168 hours. Lipid Profile: No results for input(s): CHOL, HDL, LDLCALC, TRIG, CHOLHDL, LDLDIRECT in the last 72 hours. Thyroid Function Tests: No results for input(s): TSH, T4TOTAL, FREET4, T3FREE, THYROIDAB in the last 72 hours. Anemia Panel: No results for input(s): VITAMINB12, FOLATE, FERRITIN, TIBC, IRON, RETICCTPCT in the last 72 hours. Urine analysis:    Component Value Date/Time   COLORURINE YELLOW 01/21/2021 0740   APPEARANCEUR CLOUDY (A) 01/21/2021 0740   LABSPEC 1.024 01/21/2021 0740   PHURINE 5.0 01/21/2021 0740   GLUCOSEU NEGATIVE 01/21/2021 0740   HGBUR SMALL (A) 01/21/2021 0740   BILIRUBINUR NEGATIVE 01/21/2021 0740   KETONESUR NEGATIVE 01/21/2021 0740   PROTEINUR 30 (A) 01/21/2021 0740   UROBILINOGEN 0.2 11/13/2007 1336   NITRITE NEGATIVE 01/21/2021 0740   LEUKOCYTESUR NEGATIVE 01/21/2021 0740   Sepsis Labs: @LABRCNTIP (procalcitonin:4,lacticidven:4) ) Recent Results (from the past 240 hour(s))  Culture, blood (routine x 2)     Status: None   Collection Time: 01/23/21  9:52 PM   Specimen: BLOOD  Result Value Ref Range Status   Specimen Description BLOOD LEFT ANTECUBITAL  Final   Special Requests   Final    BOTTLES DRAWN  AEROBIC AND ANAEROBIC Blood Culture adequate volume   Culture   Final    NO GROWTH 5 DAYS Performed at [facility], [address] [place], [place] [address]    Report Status 01/28/2021 FINAL  Final  Culture, blood (routine x 2)     Status: None   Collection Time: 01/23/21  9:52 PM   Specimen: BLOOD  Result Value Ref Range Status   Specimen Description BLOOD LEFT ANTECUBITAL  Final   Special Requests   Final    BOTTLES DRAWN AEROBIC AND ANAEROBIC Blood Culture adequate volume   Culture   Final    NO GROWTH 5 DAYS Performed at [facility], [address] [place], [place] [address]    Report Status 01/28/2021 FINAL  Final  Culture, blood (routine x 2)     Status: None   Collection Time: 01/26/21  7:52 AM   Specimen: BLOOD  Result Value Ref Range Status   Specimen Description BLOOD RIGHT ANTECUBITAL  Final   Special Requests   Final    BOTTLES DRAWN AEROBIC AND ANAEROBIC Blood Culture adequate volume   Culture   Final    NO GROWTH 5 DAYS Performed at [facility], [address] [place], [place] [address]    Report Status 01/31/2021 FINAL  Final  Culture, blood (routine x 2)     Status: None   Collection Time: 01/26/21  7:53 AM   Specimen: BLOOD  Result Value Ref Range Status   Specimen Description BLOOD RIGHT ANTECUBITAL  Final   Special Requests   Final    BOTTLES DRAWN AEROBIC AND ANAEROBIC Blood Culture adequate volume   Culture   Final    NO GROWTH 5 DAYS Performed at [facility], [address] [place], [place] [address]    Report Status 01/31/2021 FINAL  Final  Aerobic/Anaerobic Culture w Gram Stain (surgical/deep wound)     Status: None   Collection Time: 01/27/21  5:25 PM   Specimen: Abscess  Result Value Ref Range Status   Specimen Description ABSCESS  Final   Special Requests Normal  Final   Gram Stain   Final    RARE WBC PRESENT,BOTH PMN AND MONONUCLEAR NO ORGANISMS SEEN    Culture   Final    No growth aerobically or  anaerobically. Performed at [facility], [address] [place], [place] [address]    Report Status 02/01/2021 FINAL  Final  Body fluid culture w Gram Stain     Status: None   Collection Time: 01/28/21 12:57 PM   Specimen: Body Fluid  Result Value Ref Range Status   Specimen Description FLUID  Final   Special Requests SYNOVIAL RIGHT KNEE  Final   Gram Stain   Final    ABUNDANT WBC PRESENT, PREDOMINANTLY PMN NO ORGANISMS SEEN    Culture   Final    NO GROWTH 3 DAYS Performed at [facility], [address], [place], [place] [address]    Report Status 02/01/2021 FINAL  Final         Radiology Studies: No results found.    Scheduled Meds:  Chlorhexidine Gluconate Cloth  6 each Topical Daily   colchicine  0.6 mg Oral Daily   enoxaparin (LOVENOX) injection  40 mg Subcutaneous Daily   feeding supplement  237 mL Oral TID BM   multivitamin with minerals  1 tablet Oral Daily   sodium chloride flush  10-40 mL Intracatheter Q12H   sodium chloride flush  3 mL Intravenous Q12H   Continuous Infusions:   ceFAZolin (ANCEF) IV 2 g (02/02/21 1113)     LOS: 13 days      Calvert Cantor, MD [facility] Pager: [email or web address] 02/02/2021, 12:52 PM

## 2021-02-02 NOTE — Progress Notes (Signed)
Patient has home CPAP set up at bedside. Patient declined use at this time. Patient aware to call for assistance if needed.

## 2021-02-03 ENCOUNTER — Encounter (HOSPITAL_COMMUNITY): Payer: Self-pay | Admitting: Physical Medicine and Rehabilitation

## 2021-02-03 ENCOUNTER — Other Ambulatory Visit: Payer: Self-pay

## 2021-02-03 ENCOUNTER — Inpatient Hospital Stay (HOSPITAL_COMMUNITY)
Admission: RE | Admit: 2021-02-03 | Discharge: 2021-02-16 | DRG: 945 | Disposition: A | Discharge status: Home Health | Payer: BC Managed Care – PPO | Source: Intra-hospital | Attending: Physical Medicine and Rehabilitation | Admitting: Physical Medicine and Rehabilitation

## 2021-02-03 DIAGNOSIS — M4620 Osteomyelitis of vertebra, site unspecified: Secondary | ICD-10-CM | POA: Diagnosis not present

## 2021-02-03 DIAGNOSIS — J869 Pyothorax without fistula: Secondary | ICD-10-CM | POA: Diagnosis present

## 2021-02-03 DIAGNOSIS — R6520 Severe sepsis without septic shock: Secondary | ICD-10-CM

## 2021-02-03 DIAGNOSIS — M4644 Discitis, unspecified, thoracic region: Secondary | ICD-10-CM | POA: Diagnosis present

## 2021-02-03 DIAGNOSIS — Y752 Prosthetic and other implants, materials and neurological devices associated with adverse incidents: Secondary | ICD-10-CM | POA: Diagnosis present

## 2021-02-03 DIAGNOSIS — D649 Anemia, unspecified: Secondary | ICD-10-CM | POA: Diagnosis present

## 2021-02-03 DIAGNOSIS — M21372 Foot drop, left foot: Secondary | ICD-10-CM | POA: Diagnosis present

## 2021-02-03 DIAGNOSIS — K5900 Constipation, unspecified: Secondary | ICD-10-CM

## 2021-02-03 DIAGNOSIS — Z6841 Body Mass Index (BMI) 40.0 and over, adult: Secondary | ICD-10-CM

## 2021-02-03 DIAGNOSIS — M11262 Other chondrocalcinosis, left knee: Secondary | ICD-10-CM | POA: Diagnosis present

## 2021-02-03 DIAGNOSIS — M4624 Osteomyelitis of vertebra, thoracic region: Secondary | ICD-10-CM | POA: Diagnosis present

## 2021-02-03 DIAGNOSIS — Z79899 Other long term (current) drug therapy: Secondary | ICD-10-CM | POA: Diagnosis not present

## 2021-02-03 DIAGNOSIS — A021 Salmonella sepsis: Secondary | ICD-10-CM

## 2021-02-03 DIAGNOSIS — Z8719 Personal history of other diseases of the digestive system: Secondary | ICD-10-CM

## 2021-02-03 DIAGNOSIS — Z8261 Family history of arthritis: Secondary | ICD-10-CM | POA: Diagnosis not present

## 2021-02-03 DIAGNOSIS — E871 Hypo-osmolality and hyponatremia: Secondary | ICD-10-CM | POA: Diagnosis present

## 2021-02-03 DIAGNOSIS — K6812 Psoas muscle abscess: Secondary | ICD-10-CM | POA: Diagnosis present

## 2021-02-03 DIAGNOSIS — Z96642 Presence of left artificial hip joint: Secondary | ICD-10-CM | POA: Diagnosis present

## 2021-02-03 DIAGNOSIS — Y92239 Unspecified place in hospital as the place of occurrence of the external cause: Secondary | ICD-10-CM | POA: Diagnosis not present

## 2021-02-03 DIAGNOSIS — R188 Other ascites: Secondary | ICD-10-CM

## 2021-02-03 DIAGNOSIS — A4151 Sepsis due to Escherichia coli [E. coli]: Principal | ICD-10-CM

## 2021-02-03 DIAGNOSIS — A419 Sepsis, unspecified organism: Secondary | ICD-10-CM | POA: Diagnosis present

## 2021-02-03 DIAGNOSIS — Z8249 Family history of ischemic heart disease and other diseases of the circulatory system: Secondary | ICD-10-CM | POA: Diagnosis not present

## 2021-02-03 DIAGNOSIS — W19XXXA Unspecified fall, initial encounter: Secondary | ICD-10-CM | POA: Diagnosis not present

## 2021-02-03 DIAGNOSIS — Z981 Arthrodesis status: Secondary | ICD-10-CM | POA: Diagnosis not present

## 2021-02-03 DIAGNOSIS — M11261 Other chondrocalcinosis, right knee: Secondary | ICD-10-CM

## 2021-02-03 DIAGNOSIS — K5901 Slow transit constipation: Secondary | ICD-10-CM

## 2021-02-03 DIAGNOSIS — D65 Disseminated intravascular coagulation [defibrination syndrome]: Secondary | ICD-10-CM

## 2021-02-03 DIAGNOSIS — R5381 Other malaise: Secondary | ICD-10-CM | POA: Diagnosis present

## 2021-02-03 DIAGNOSIS — G4733 Obstructive sleep apnea (adult) (pediatric): Secondary | ICD-10-CM | POA: Diagnosis present

## 2021-02-03 DIAGNOSIS — Z800 Family history of malignant neoplasm of digestive organs: Secondary | ICD-10-CM | POA: Diagnosis not present

## 2021-02-03 DIAGNOSIS — R7303 Prediabetes: Secondary | ICD-10-CM | POA: Diagnosis present

## 2021-02-03 DIAGNOSIS — T8463XD Infection and inflammatory reaction due to internal fixation device of spine, subsequent encounter: Principal | ICD-10-CM

## 2021-02-03 MED ORDER — CEFAZOLIN SODIUM-DEXTROSE 2-4 GM/100ML-% IV SOLN
2.0000 g | Freq: Three times a day (TID) | INTRAVENOUS | Status: DC
  Administered 2021-02-03 – 2021-02-16 (×39): 2 g via INTRAVENOUS
  Filled 2021-02-03 (×41): qty 100

## 2021-02-03 MED ORDER — OXYCODONE HCL 5 MG PO TABS
5.0000 mg | ORAL_TABLET | Freq: Four times a day (QID) | ORAL | Status: DC | PRN
  Administered 2021-02-04 – 2021-02-15 (×9): 5 mg via ORAL
  Filled 2021-02-03 (×11): qty 1

## 2021-02-03 MED ORDER — ADULT MULTIVITAMIN W/MINERALS CH
1.0000 | ORAL_TABLET | Freq: Every day | ORAL | Status: DC
  Administered 2021-02-04 – 2021-02-16 (×13): 1 via ORAL
  Filled 2021-02-03 (×13): qty 1

## 2021-02-03 MED ORDER — COLCHICINE 0.6 MG PO TABS
0.6000 mg | ORAL_TABLET | Freq: Every day | ORAL | Status: DC
  Administered 2021-02-04 – 2021-02-16 (×13): 0.6 mg via ORAL
  Filled 2021-02-03 (×13): qty 1

## 2021-02-03 MED ORDER — ACETAMINOPHEN 325 MG PO TABS
650.0000 mg | ORAL_TABLET | Freq: Four times a day (QID) | ORAL | Status: DC | PRN
  Administered 2021-02-04 – 2021-02-16 (×22): 650 mg via ORAL
  Filled 2021-02-03 (×25): qty 2

## 2021-02-03 MED ORDER — ACETAMINOPHEN 650 MG RE SUPP: 650.0000 mg | Freq: Four times a day (QID) | RECTAL | Status: DC | PRN

## 2021-02-03 MED ORDER — CHLORHEXIDINE GLUCONATE CLOTH 2 % EX PADS
6.0000 | MEDICATED_PAD | Freq: Every day | CUTANEOUS | Status: DC
  Administered 2021-02-03 – 2021-02-12 (×10): 6 via TOPICAL

## 2021-02-03 MED ORDER — ENOXAPARIN SODIUM 40 MG/0.4ML IJ SOSY: 40.0000 mg | PREFILLED_SYRINGE | INTRAMUSCULAR | Status: DC

## 2021-02-03 MED ORDER — ENSURE ENLIVE PO LIQD
237.0000 mL | Freq: Three times a day (TID) | ORAL | Status: DC
  Administered 2021-02-03 – 2021-02-14 (×24): 237 mL via ORAL

## 2021-02-03 MED ORDER — COLCHICINE 0.6 MG PO TABS: 0.6000 mg | ORAL_TABLET | Freq: Every day | ORAL | Status: DC

## 2021-02-03 MED ORDER — SODIUM CHLORIDE 0.9% FLUSH
10.0000 mL | INTRAVENOUS | Status: DC | PRN
  Administered 2021-02-06 – 2021-02-11 (×2): 10 mL

## 2021-02-03 MED ORDER — ENOXAPARIN SODIUM 40 MG/0.4ML IJ SOSY
40.0000 mg | PREFILLED_SYRINGE | Freq: Every day | INTRAMUSCULAR | Status: DC
  Administered 2021-02-04 – 2021-02-16 (×13): 40 mg via SUBCUTANEOUS
  Filled 2021-02-03 (×13): qty 0.4

## 2021-02-03 MED ORDER — ONDANSETRON HCL 4 MG PO TABS
4.0000 mg | ORAL_TABLET | Freq: Four times a day (QID) | ORAL | Status: DC | PRN
  Administered 2021-02-04: 4 mg via ORAL
  Filled 2021-02-03 (×2): qty 1

## 2021-02-03 MED ORDER — ENSURE ENLIVE PO LIQD: 237.0000 mL | Freq: Three times a day (TID) | ORAL | 12 refills | Status: DC

## 2021-02-03 MED ORDER — ONDANSETRON HCL 4 MG/2ML IJ SOLN: 4.0000 mg | Freq: Four times a day (QID) | INTRAMUSCULAR | Status: DC | PRN

## 2021-02-03 MED ORDER — ACETAMINOPHEN 325 MG PO TABS: 650.0000 mg | ORAL_TABLET | Freq: Four times a day (QID) | ORAL | Status: DC | PRN

## 2021-02-03 MED ORDER — POLYETHYLENE GLYCOL 3350 17 G PO PACK: 17.0000 g | PACK | Freq: Every day | ORAL | Status: DC | PRN

## 2021-02-03 NOTE — H&P (Signed)
Physical Medicine and Rehabilitation Admission H&P  CC: T10 osteomyelitis/sepsis with LE weakness    HPI: Marco Kennedy is a 70 year old right-handed male with history significant of obesity with BMI 41.88, back surgery with pelvis fixation in 03/2020, OSA on BiPAP and recent admission 01/10/2021 - 01/16/2021 for sepsis secondary to moderate-sized abscess near the pelvic hardware.  Neurosurgery, IR and ID were involved in his care at that time and underwent wound debridement with neurosurgery on 7/19 with E. coli from wound cultures.  He was given cefazolin with plan 6 weeks of IV treatment by PICC line.  After patient was discharged to home noted bilateral rib pain needing to sleep in a lift chair.  Wife noted some increased swelling lower extremities and 10 pound weight gain.  Home health nurse 7/25 to assess the PICC line recommended follow-up PCP they decided to try telehealth by that time patient was noting some coughing and nonspecific chest discomfort as well as poor appetite.  He developed a low-grade fever and chills.  Admitted 01/20/2021 with chemistries unremarkable except sodium 134 glucose 162, sedimentation rate 70, procalcitonin 1.01, blood cultures currently no growth to date, gram stain no organisms seen, MRSA nondetected, hemoglobin 8.9, WBC 29,000.  Patient was hypoxic 88% on room air.  CT completed showing empyema and also discitis/osteomyelitis of T10.  Patient did undergo bilateral pigtail catheters placed in pleural cavities.  Echocardiogram with ejection fraction of 65 to 70% no wall motion abnormalities.  On 01/24/2021 right pleural catheter removed he did undergo IR placed left JP drain 01/27/2021.  Neurosurgery follow-up in regards to osteomyelitis no current plan for repeat surgical intervention.  Infectious disease follow-up patient currently remains on cefazolin until 03/11/2021 most recent blood cultures negative.  Hospital course complicated by left knee pseudogout right knee  was aspirated 44 cc of clear fluid and steroid injected most recent cultures negative.  Patient is maintained on Lovenox for DVT prophylaxis.  Leukocytosis has improved 12,900 and monitored.  His hemoglobin remained stable 9.0.  Bouts of hyponatremia 127-132 with urine osmolality 441.  Tolerating a regular consistency diet.  Therapy evaluations completed due to patient decreased functional mobility was admitted for a comprehensive rehab program.  Pt reports using urinal/peeing; sleeping OK LBM today and also went yesterday.  Pain- can't get comfortable all the time- pain at rest 3-4/10; but spikes to 8-9/10 with movement   Review of Systems  Constitutional:  Positive for chills, fever and malaise/fatigue.  HENT:  Negative for hearing loss.   Eyes:  Negative for blurred vision and double vision.  Respiratory:  Positive for cough and shortness of breath. Negative for wheezing.   Cardiovascular:  Positive for leg swelling. Negative for chest pain and palpitations.  Gastrointestinal:  Positive for constipation. Negative for heartburn, nausea and vomiting.  Genitourinary:  Negative for dysuria, flank pain and hematuria.  Musculoskeletal:  Positive for back pain, joint pain and myalgias.  Skin:  Negative for rash.  All other systems reviewed and are negative. Past Medical History:  Diagnosis Date   Arthritis    arthritis-bilateral knees, left Hip.   Degenerative lumbar spinal stenosis    EKG abnormality    06-08-16- being evaluated by cardiologist  in [place]   Idiopathic scoliosis of lumbar region    Joint stiffness    Knee pain    Leg swelling    Low back pain    Achy, shooting pain.  Hurts to Walk or stand up straight.   Lumbar radiculopathy  Scoliosis concern    Sleep apnea    Bipap use nightly 25/19 -3l/m oxygen   Spondylolisthesis of lumbar region    Transfusion history    [place] ,[place] after bleeding post colon polyp removal and colonscopy procedure   Past Surgical  History:  Procedure Laterality Date   ABDOMINAL EXPOSURE N/A 04/18/2020   Procedure: ABDOMINAL EXPOSURE;  Surgeon: Marty Heck, MD;  Location: [facility];  Service: Vascular;  Laterality: N/A;   ANTERIOR LAT LUMBAR FUSION Left 04/18/2020   Procedure: ANTERIOR LATERAL LUMBAR FUSION LUMBAR TWO-THREE, LUMBAR THREE-FOUR;  Surgeon: Erline Levine, MD;  Location: [facility];  Service: Neurosurgery;  Laterality: Left;   ANTERIOR LUMBAR FUSION N/A 04/18/2020   Procedure: Lumbar Four-Five Lumbar Five- Sacral One Anterior lumbar Interbody Fusion;  Surgeon: Erline Levine, MD;  Location: [facility];  Service: Neurosurgery;  Laterality: N/A;   APPLICATION OF INTRAOPERATIVE CT SCAN N/A 04/21/2020   Procedure: APPLICATION OF INTRAOPERATIVE CT SCAN;  Surgeon: Erline Levine, MD;  Location: [facility];  Service: Neurosurgery;  Laterality: N/A;   BACK SURGERY     lumbar disc surgery   COLONOSCOPY W/ POLYPECTOMY     HERNIA REPAIR Left    left inguinal hernia repair   KNEE ARTHROSCOPY Right 2003    -scope for meniscus   LUMBAR WOUND DEBRIDEMENT N/A 01/13/2021   Procedure: THORACOLUMBAR WOUND DEBRIDEMENT;  Surgeon: Erline Levine, MD;  Location: [facility];  Service: Neurosurgery;  Laterality: N/A;   POSTERIOR LUMBAR FUSION 4 LEVEL N/A 04/21/2020   Procedure: Thoracic ten to pelvis pedicle screw fixation with osteotomies;  Surgeon: Erline Levine, MD;  Location: [facility];  Service: Neurosurgery;  Laterality: N/A;   TOTAL HIP ARTHROPLASTY Left 06/15/2016   Procedure: LEFT TOTAL HIP ARTHROPLASTY ANTERIOR APPROACH;  Surgeon: Paralee Cancel, MD;  Location: [facility];  Service: Orthopedics;  Laterality: Left;   Family History  Problem Relation Age of Onset   Hypertension Mother    Arthritis Mother    Stomach cancer Father    Hypertension Brother    Social History:  reports that he has never smoked. He has never used smokeless tobacco. He reports current alcohol use. He reports that he does not use drugs. Allergies: No Known  Allergies Medications Prior to Admission  Medication Sig Dispense Refill   ceFAZolin (ANCEF) IVPB Inject 2 g into the vein every 8 (eight) hours. Indication:  vertebral osteomyelitis First Dose: No Last Day of Therapy:  02/24/2021 Labs - Once weekly:  CBC/D and BMP, Labs - Every other week:  ESR and CRP Method of administration: IV Push Method of administration may be changed at the discretion of home infusion pharmacist based upon assessment of the patient and/or caregiver's ability to self-administer the medication ordered. 120 Units 0   cholecalciferol (VITAMIN D3) 25 MCG (1000 UNIT) tablet Take 1,000 Units by mouth daily.     diclofenac (VOLTAREN) 75 MG EC tablet Take 75 mg by mouth 2 (two) times daily.     Multiple Vitamin (MULTIVITAMIN WITH MINERALS) TABS tablet Take 1 tablet by mouth daily.     oxyCODONE (OXY IR/ROXICODONE) 5 MG immediate release tablet Take 1 tablet (5 mg total) by mouth every 6 (six) hours as needed for moderate pain. 30 tablet 0   polyethylene glycol (MIRALAX / GLYCOLAX) 17 g packet Take 17 g by mouth 2 (two) times daily. 14 each 0    Drug Regimen Review Drug regimen was reviewed and remains appropriate with no significant issues identified  Home: Home Living Family/patient  expects to be discharged to:: Private residence Living Arrangements: Spouse/significant other Available Help at Discharge: Family, Available 24 hours/day Type of Home: House Home Access: Stairs to enter [employer] of Steps: approximately 14 total (6-7 steps followed by a walkway then 6-7 additional steps) Entrance Stairs-Rails: Right, Can reach both Home Layout: Two level, Able to live on main level with bedroom/bathroom Alternate Level Stairs-Number of Steps: flight Bathroom Shower/Tub: Chiropodist: Handicapped height Home Equipment: Civil engineer, contracting, Bedside commode, Environmental consultant - 2 wheels, Cane - single point, Environmental consultant - 4 wheels, Other (comment), Toilet  riser Additional Comments: 1 step up to enter main level if wife drives pt around to back door driving through neighbor's yard, which they do occasionally   Functional History: Prior Function Level of Independence: Needs assistance Gait / Transfers Assistance Needed: Was able to walk 50-100 ft without AD, used cane for longer distances, but uses RW on uneven surfaces. mod I with all mobility, including stairs. ADL's / Homemaking Assistance Needed: Wife assists in dressing and bathing bottom half. Wife does cooking and cleaning. Pt manages own meds and finances. 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  Functional Status:  Mobility: Bed Mobility Overal bed mobility: Needs Assistance Bed Mobility: Sit to Sidelying Rolling: Mod assist Sidelying to sit: HOB elevated, Min assist Sit to sidelying: Mod assist, +2 for physical assistance General bed mobility comments: ModA for BLE management back into bed Transfers Overall transfer level: Needs assistance Equipment used: Rolling walker (2 wheeled) Transfers: Sit to/from Stand Sit to Stand: +2 physical assistance, Mod assist Stand pivot transfers: Min assist General transfer comment: ModA + 2 to rise to stand from recliner x 2. Cues for scooting to edge, hand placement, rocking to gain momentum, "nose over toes." Ambulation/Gait Ambulation/Gait assistance: Min assist, +2 safety/equipment Gait Distance (Feet): 60 Feet (40", 20") Assistive device: Rolling walker (2 wheeled) Gait Pattern/deviations: Step-through pattern, Decreased stride length, Trunk flexed, Shuffle, Decreased stance time - right General Gait Details: Pt with short, shuffling gait pattern, increased trunk/hip flexion, decreased bilateral foot clearance. MinA for balance, cues for activity pacing, close chair follow. Ambulating 40 ft then an additional 20 ft with seated rest break in between Gait velocity: reduced Gait velocity interpretation: <1.31 ft/sec, indicative of household  ambulator    ADL: ADL Overall ADL's : Needs assistance/impaired Eating/Feeding: Modified independent Grooming: Wash/dry hands, Wash/dry face, Oral care, Brushing hair, Set up, Sitting Grooming Details (indicate cue type and reason): unable to attend to task with eyes open; left with set-upA for spouse to assise Upper Body Bathing: Minimal assistance, Sitting, Cueing for safety, Cueing for sequencing Lower Body Bathing: Maximal assistance, Cueing for safety, Sitting/lateral leans, Sit to/from stand Upper Body Dressing : Minimal assistance, Sitting Lower Body Dressing: Maximal assistance, Cueing for safety, +2 for physical assistance, +2 for safety/equipment, Sitting/lateral leans, Sit to/from stand Toilet Transfer: Minimal assistance, Stand-pivot, BSC, RW Toilet Transfer Details (indicate cue type and reason): sit to stand only Toileting- Clothing Manipulation and Hygiene: Maximal assistance, +2 for physical assistance, +2 for safety/equipment, Cueing for safety, Cueing for sequencing, Sit to/from stand Functional mobility during ADLs: Minimal assistance, Rolling walker General ADL Comments: Pt alert today, no pain meds prior to session. Pt with increased pain in R knee 9/10 pain. Pt limited by set-upA for light grooming and x1 sit to stand.  Cognition: Cognition Overall Cognitive Status: Within Functional Limits for tasks assessed Orientation Level: Oriented X4 Cognition Arousal/Alertness: Awake/alert Behavior During Therapy: WFL for tasks assessed/performed Overall Cognitive Status: Within  Functional Limits for tasks assessed Area of Impairment: Attention, Following commands Current Attention Level: Focused Following Commands: Follows one step commands inconsistently General Comments: WFL for basic tasks  Physical Exam: Blood pressure 134/73, pulse 70, temperature 98.9 F (37.2 C), temperature source Oral, resp. rate 16, height 5' 7"  (1.702 m), weight 121.3 kg, SpO2 94 %. Physical  Exam Vitals and nursing note reviewed.  Constitutional:      Appearance: He is obese.     Comments: Morbidly obese elderly male with Cefazolin running via PICC in RUE; laying supine in bed; NAD  HENT:     Head: Normocephalic and atraumatic.     Right Ear: External ear normal.     Left Ear: External ear normal.     Nose: Nose normal. No congestion.     Comments: 2L O2 by [place]    Mouth/Throat:     Mouth: Mucous membranes are dry.     Pharynx: Oropharynx is clear. No oropharyngeal exudate.  Eyes:     General:        Right eye: No discharge.        Left eye: No discharge.     Extraocular Movements: Extraocular movements intact.  Cardiovascular:     Comments: Tachycardic/borderline- regular rhythm; no JVD Pulmonary:     Comments: CTA B/L- no W/R/R- good air movement   Abdominal:     Comments: Abd soft, protuberant, NT; hypoactive BS; has a drain in LLQ/lateral aspect of abdomen - nothing in drainage bag.   Musculoskeletal:     Cervical back: Normal range of motion and neck supple.     Comments: Ue's 5/5 B/L in biceps, triceps, WE< grip and finger abd B/L LE's  RLE- HF 4+/5, KE 5-/5, DF 3+/5 and PF 4/5 LLE- HF 4-/5, KE 5-/5, DF 2+/5 and PF 4-/5  Bandage on R superior knee from tap/injection  Skin:    Comments: A small area of peeled skin/MASD? On inner buttock on R Also honeycomb dressing on thoracic spine seen- no significant drainage  Neurological:     Comments: Patient is alert in no acute distress oriented x3 and follows commands. Intact to light touch in all 4 extremities   Psychiatric:     Comments: Full affect, appropriate    Results for orders placed or performed during the hospital encounter of 01/20/21 (from the past 48 hour(s))  Creatinine, fluid (pleural, peritoneal, JP Drainage)     Status: None   Collection Time: 02/01/21  3:02 PM  Result Value Ref Range   Creat, Fluid 0.7 mg/dL    Comment: (NOTE) No normal range established for this test Results should be  evaluated in conjunction with serum values    Fluid Type-FCRE PERITONEAL CAVITY     Comment: Performed at [facility], [place] [address], [place], [place] [address]   No results found.     Medical Problem List and Plan: 1.   Debility secondary to sepsis due to E. Coli/psoas abscess.S/Pwound debridement 7/19  -patient may not shower until drain removed  -ELOS/Goals: 12-16 days Supervision 2.  Antithrombotics: -DVT/anticoagulation: Lovenox  -antiplatelet therapy: N/A 3. Pain Management: Oxycodone as needed 4. Mood: Provide emotional support  -antipsychotic agents: N/A 5. Neuropsych: This patient is capable of making decisions on his own behalf. 6. Skin/Wound Care: Routine skin checks- will remove honeycomb in a few days.  7. Fluids/Electrolytes/Nutrition: Routine in and out with follow up chemestries 8.ID.  Continue Cefazolin 2 gram every 8 hours through 03/11/2021 per infectious  disease. Will f/u surgery to see when needs to have drain removed.  9.  Left knee pseudogout.  Status post aspiration.  Continue colchicine 10.  Acute on chronic anemia.  Follow-up CBC 11.  Hyponatremia.  Follow-up chemistries 12.Obesity.[id number].Follow up dietery 13.  OSA.  BiPAP- pt says pressure too high and wears O2 instead.     Lavon Paganini Angiulli, PA-C 02/03/2021   I have personally performed a face to face diagnostic evaluation of this patient and formulated the key components of the plan.  Additionally, I have personally reviewed laboratory data, imaging studies, as well as relevant notes and concur with the physician assistant's documentation above.   The patient's status has not changed from the original H&P.  Any changes in documentation from the acute care chart have been noted above.

## 2021-02-03 NOTE — H&P (Signed)
Physical Medicine and Rehabilitation Admission H&P   CC: T10 osteomyelitis/sepsis with LE weakness     HPI: Marco Kennedy is a 70 year old right-handed male with history significant of obesity with BMI 41.88, back surgery with pelvis fixation in 03/2020, OSA on BiPAP and recent admission 01/10/2021 - 01/16/2021 for sepsis secondary to moderate-sized abscess near the pelvic hardware.  Neurosurgery, IR and ID were involved in his care at that time and underwent wound debridement with neurosurgery on 7/19 with E. coli from wound cultures.  He was given cefazolin with plan 6 weeks of IV treatment by PICC line.  After patient was discharged to home noted bilateral rib pain needing to sleep in a lift chair.  Wife noted some increased swelling lower extremities and 10 pound weight gain.  Home health nurse 7/25 to assess the PICC line recommended follow-up PCP they decided to try telehealth by that time patient was noting some coughing and nonspecific chest discomfort as well as poor appetite.  He developed a low-grade fever and chills.  Admitted 01/20/2021 with chemistries unremarkable except sodium 134 glucose 162, sedimentation rate 70, procalcitonin 1.01, blood cultures currently no growth to date, gram stain no organisms seen, MRSA nondetected, hemoglobin 8.9, WBC 29,000.  Patient was hypoxic 88% on room air.  CT completed showing empyema and also discitis/osteomyelitis of T10.  Patient did undergo bilateral pigtail catheters placed in pleural cavities.  Echocardiogram with ejection fraction of 65 to 70% no wall motion abnormalities.  On 01/24/2021 right pleural catheter removed he did undergo IR placed left JP drain 01/27/2021.  Neurosurgery follow-up in regards to osteomyelitis no current plan for repeat surgical intervention.  Infectious disease follow-up patient currently remains on cefazolin until 03/11/2021 most recent blood cultures negative.  Hospital course complicated by left knee pseudogout right  knee was aspirated 44 cc of clear fluid and steroid injected most recent cultures negative.  Patient is maintained on Lovenox for DVT prophylaxis.  Leukocytosis has improved 12,900 and monitored.  His hemoglobin remained stable 9.0.  Bouts of hyponatremia 127-132 with urine osmolality 441.  Tolerating a regular consistency diet.  Therapy evaluations completed due to patient decreased functional mobility was admitted for a comprehensive rehab program.   Pt reports using urinal/peeing; sleeping OK LBM today and also went yesterday. Pain- can't get comfortable all the time- pain at rest 3-4/10; but spikes to 8-9/10 with movement     Review of Systems Constitutional:  Positive for chills, fever and malaise/fatigue. HENT:  Negative for hearing loss.   Eyes:  Negative for blurred vision and double vision. Respiratory:  Positive for cough and shortness of breath. Negative for wheezing.   Cardiovascular:  Positive for leg swelling. Negative for chest pain and palpitations. Gastrointestinal:  Positive for constipation. Negative for heartburn, nausea and vomiting. Genitourinary:  Negative for dysuria, flank pain and hematuria. Musculoskeletal:  Positive for back pain, joint pain and myalgias. Skin:  Negative for rash.  All other systems reviewed and are negative.     Past Medical History:  Diagnosis Date   Arthritis      arthritis-bilateral knees, left Hip.   Degenerative lumbar spinal stenosis     EKG abnormality      06-08-16- being evaluated by cardiologist  in [place]   Idiopathic scoliosis of lumbar region     Joint stiffness     Knee pain     Leg swelling     Low back pain      Achy, shooting  pain.  Hurts to Walk or stand up straight.   Lumbar radiculopathy     Scoliosis concern     Sleep apnea      Bipap use nightly 25/19 -3l/m oxygen   Spondylolisthesis of lumbar region     Transfusion history      [place] ,[place] after bleeding post colon polyp removal and colonscopy procedure          Past Surgical History:  Procedure Laterality Date   ABDOMINAL EXPOSURE N/A 04/18/2020    Procedure: ABDOMINAL EXPOSURE;  Surgeon: Marty Heck, MD;  Location: [facility];  Service: Vascular;  Laterality: N/A;   ANTERIOR LAT LUMBAR FUSION Left 04/18/2020    Procedure: ANTERIOR LATERAL LUMBAR FUSION LUMBAR TWO-THREE, LUMBAR THREE-FOUR;  Surgeon: Erline Levine, MD;  Location: [facility];  Service: Neurosurgery;  Laterality: Left;   ANTERIOR LUMBAR FUSION N/A 04/18/2020    Procedure: Lumbar Four-Five Lumbar Five- Sacral One Anterior lumbar Interbody Fusion;  Surgeon: Erline Levine, MD;  Location: [facility];  Service: Neurosurgery;  Laterality: N/A;   APPLICATION OF INTRAOPERATIVE CT SCAN N/A 04/21/2020    Procedure: APPLICATION OF INTRAOPERATIVE CT SCAN;  Surgeon: Erline Levine, MD;  Location: [facility];  Service: Neurosurgery;  Laterality: N/A;   BACK SURGERY        lumbar disc surgery   COLONOSCOPY W/ POLYPECTOMY       HERNIA REPAIR Left      left inguinal hernia repair   KNEE ARTHROSCOPY Right 2003     -scope for meniscus   LUMBAR WOUND DEBRIDEMENT N/A 01/13/2021    Procedure: THORACOLUMBAR WOUND DEBRIDEMENT;  Surgeon: Erline Levine, MD;  Location: [facility];  Service: Neurosurgery;  Laterality: N/A;   POSTERIOR LUMBAR FUSION 4 LEVEL N/A 04/21/2020    Procedure: Thoracic ten to pelvis pedicle screw fixation with osteotomies;  Surgeon: Erline Levine, MD;  Location: [facility];  Service: Neurosurgery;  Laterality: N/A;   TOTAL HIP ARTHROPLASTY Left 06/15/2016    Procedure: LEFT TOTAL HIP ARTHROPLASTY ANTERIOR APPROACH;  Surgeon: Paralee Cancel, MD;  Location: [facility];  Service: Orthopedics;  Laterality: Left;         Family History  Problem Relation Age of Onset   Hypertension Mother     Arthritis Mother     Stomach cancer Father     Hypertension Brother      Social History:  reports that he has never smoked. He has never used smokeless tobacco. He reports current alcohol use. He reports that he  does not use drugs. Allergies: No Known Allergies       Medications Prior to Admission  Medication Sig Dispense Refill   ceFAZolin (ANCEF) IVPB Inject 2 g into the vein every 8 (eight) hours. Indication:  vertebral osteomyelitis First Dose: No Last Day of Therapy:  02/24/2021 Labs - Once weekly:  CBC/D and BMP, Labs - Every other week:  ESR and CRP Method of administration: IV Push Method of administration may be changed at the discretion of home infusion pharmacist based upon assessment of the patient and/or caregiver's ability to self-administer the medication ordered. 120 Units 0   cholecalciferol (VITAMIN D3) 25 MCG (1000 UNIT) tablet Take 1,000 Units by mouth daily.       diclofenac (VOLTAREN) 75 MG EC tablet Take 75 mg by mouth 2 (two) times daily.       Multiple Vitamin (MULTIVITAMIN WITH MINERALS) TABS tablet Take 1 tablet by mouth daily.       oxyCODONE (OXY IR/ROXICODONE) 5 MG immediate  release tablet Take 1 tablet (5 mg total) by mouth every 6 (six) hours as needed for moderate pain. 30 tablet 0   polyethylene glycol (MIRALAX / GLYCOLAX) 17 g packet Take 17 g by mouth 2 (two) times daily. 14 each 0      Drug Regimen Review Drug regimen was reviewed and remains appropriate with no significant issues identified   Home: Home Living Family/patient expects to be discharged to:: Private residence Living Arrangements: Spouse/significant other Available Help at Discharge: Family, Available 24 hours/day Type of Home: House Home Access: Stairs to enter [employer] of Steps: approximately 14 total (6-7 steps followed by a walkway then 6-7 additional steps) Entrance Stairs-Rails: Right, Can reach both Home Layout: Two level, Able to live on main level with bedroom/bathroom Alternate Level Stairs-Number of Steps: flight Bathroom Shower/Tub: Chiropodist: Handicapped height Home Equipment: Civil engineer, contracting, Bedside commode, Environmental consultant - 2 wheels, Cane - single  point, Environmental consultant - 4 wheels, Other (comment), Toilet riser Additional Comments: 1 step up to enter main level if wife drives pt around to back door driving through neighbor's yard, which they do occasionally   Functional History: Prior Function Level of Independence: Needs assistance Gait / Transfers Assistance Needed: Was able to walk 50-100 ft without AD, used cane for longer distances, but uses RW on uneven surfaces. mod I with all mobility, including stairs. ADL's / Homemaking Assistance Needed: Wife assists in dressing and bathing bottom half. Wife does cooking and cleaning. Pt manages own meds and finances. 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   Functional Status:  Mobility: Bed Mobility Overal bed mobility: Needs Assistance Bed Mobility: Sit to Sidelying Rolling: Mod assist Sidelying to sit: HOB elevated, Min assist Sit to sidelying: Mod assist, +2 for physical assistance General bed mobility comments: ModA for BLE management back into bed Transfers Overall transfer level: Needs assistance Equipment used: Rolling walker (2 wheeled) Transfers: Sit to/from Stand Sit to Stand: +2 physical assistance, Mod assist Stand pivot transfers: Min assist General transfer comment: ModA + 2 to rise to stand from recliner x 2. Cues for scooting to edge, hand placement, rocking to gain momentum, "nose over toes." Ambulation/Gait Ambulation/Gait assistance: Min assist, +2 safety/equipment Gait Distance (Feet): 60 Feet (40", 20") Assistive device: Rolling walker (2 wheeled) Gait Pattern/deviations: Step-through pattern, Decreased stride length, Trunk flexed, Shuffle, Decreased stance time - right General Gait Details: Pt with short, shuffling gait pattern, increased trunk/hip flexion, decreased bilateral foot clearance. MinA for balance, cues for activity pacing, close chair follow. Ambulating 40 ft then an additional 20 ft with seated rest break in between Gait velocity: reduced Gait velocity  interpretation: <1.31 ft/sec, indicative of household ambulator   ADL: ADL Overall ADL's : Needs assistance/impaired Eating/Feeding: Modified independent Grooming: Wash/dry hands, Wash/dry face, Oral care, Brushing hair, Set up, Sitting Grooming Details (indicate cue type and reason): unable to attend to task with eyes open; left with set-upA for spouse to assise Upper Body Bathing: Minimal assistance, Sitting, Cueing for safety, Cueing for sequencing Lower Body Bathing: Maximal assistance, Cueing for safety, Sitting/lateral leans, Sit to/from stand Upper Body Dressing : Minimal assistance, Sitting Lower Body Dressing: Maximal assistance, Cueing for safety, +2 for physical assistance, +2 for safety/equipment, Sitting/lateral leans, Sit to/from stand Toilet Transfer: Minimal assistance, Stand-pivot, BSC, RW Toilet Transfer Details (indicate cue type and reason): sit to stand only Toileting- Clothing Manipulation and Hygiene: Maximal assistance, +2 for physical assistance, +2 for safety/equipment, Cueing for safety, Cueing for sequencing, Sit to/from stand Functional  mobility during ADLs: Minimal assistance, Rolling walker General ADL Comments: Pt alert today, no pain meds prior to session. Pt with increased pain in R knee 9/10 pain. Pt limited by set-upA for light grooming and x1 sit to stand.   Cognition: Cognition Overall Cognitive Status: Within Functional Limits for tasks assessed Orientation Level: Oriented X4 Cognition Arousal/Alertness: Awake/alert Behavior During Therapy: WFL for tasks assessed/performed Overall Cognitive Status: Within Functional Limits for tasks assessed Area of Impairment: Attention, Following commands Current Attention Level: Focused Following Commands: Follows one step commands inconsistently General Comments: WFL for basic tasks   Physical Exam: Blood pressure 134/73, pulse 70, temperature 98.9 F (37.2 C), temperature source Oral, resp. rate 16, height  5' 7"  (1.702 m), weight 121.3 kg, SpO2 94 %. Physical Exam Vitals and nursing note reviewed. Constitutional:      Appearance: He is obese.    Comments: Morbidly obese elderly male with Cefazolin running via PICC in RUE; laying supine in bed; NAD  HENT:    Head: Normocephalic and atraumatic.    Right Ear: External ear normal.    Left Ear: External ear normal.    Nose: Nose normal. No congestion.    Comments: 2L O2 by [place]    Mouth/Throat:    Mouth: Mucous membranes are dry.    Pharynx: Oropharynx is clear. No oropharyngeal exudate. Eyes:    General:        Right eye: No discharge.        Left eye: No discharge.    Extraocular Movements: Extraocular movements intact. Cardiovascular:    Comments: Tachycardic/borderline- regular rhythm; no JVD Pulmonary:    Comments: CTA B/L- no W/R/R- good air movement     Abdominal:    Comments: Abd soft, protuberant, NT; hypoactive BS; has a drain in LLQ/lateral aspect of abdomen - nothing in drainage bag.   Musculoskeletal:    Cervical back: Normal range of motion and neck supple.    Comments: Ue's 5/5 B/L in biceps, triceps, WE< grip and finger abd B/L LE's RLE- HF 4+/5, KE 5-/5, DF 3+/5 and PF 4/5 LLE- HF 4-/5, KE 5-/5, DF 2+/5 and PF 4-/5   Bandage on R superior knee from tap/injection  Skin:    Comments: A small area of peeled skin/MASD? On inner buttock on R Also honeycomb dressing on thoracic spine seen- no significant drainage  Neurological:    Comments: Patient is alert in no acute distress oriented x3 and follows commands. Intact to light touch in all 4 extremities   Psychiatric:    Comments: Full affect, appropriate      Lab Results Last 48 Hours        Results for orders placed or performed during the hospital encounter of 01/20/21 (from the past 48 hour(s))  Creatinine, fluid (pleural, peritoneal, JP Drainage)     Status: None    Collection Time: 02/01/21  3:02 PM  Result Value Ref Range    Creat, Fluid 0.7 mg/dL       Comment: (NOTE) No normal range established for this test Results should be evaluated in conjunction with serum values      Fluid Type-FCRE PERITONEAL CAVITY        Comment: Performed at [facility], [place] [address] [place], [place] [address]      Imaging Results (Last 48 hours)  No results found.           Medical Problem List and Plan: 1.   Debility secondary to sepsis due to  E. Coli/psoas abscess.S/Pwound debridement 7/19             -patient may not shower until drain removed             -ELOS/Goals: 12-16 days Supervision 2.  Antithrombotics: -DVT/anticoagulation: Lovenox             -antiplatelet therapy: N/A 3. Pain Management: Oxycodone as needed 4. Mood: Provide emotional support             -antipsychotic agents: N/A 5. Neuropsych: This patient is capable of making decisions on his own behalf. 6. Skin/Wound Care: Routine skin checks- will remove honeycomb in a few days.  7. Fluids/Electrolytes/Nutrition: Routine in and out with follow up chemestries 8.ID.  Continue Cefazolin 2 gram every 8 hours through 03/11/2021 per infectious disease. Will f/u surgery to see when needs to have drain removed.  9.  Left knee pseudogout.  Status post aspiration.  Continue colchicine 10.  Acute on chronic anemia.  Follow-up CBC 11.  Hyponatremia.  Follow-up chemistries 12.Obesity.[id number].Follow up dietery 13.  OSA.  BiPAP- pt says pressure too high and wears O2 instead.        Lavon Paganini Angiulli, PA-C 02/03/2021    I have personally performed a face to face diagnostic evaluation of this patient and formulated the key components of the plan.  Additionally, I have personally reviewed laboratory data, imaging studies, as well as relevant notes and concur with the physician assistant's documentation above.   The patient's status has not changed from the original H&P.  Any changes in documentation from the acute care chart have been noted above.

## 2021-02-03 NOTE — PMR Pre-admission (Signed)
PMR Admission Coordinator Pre-Admission Assessment  Patient: Marco Kennedy is an 70 y.o., male MRN: [id number] DOB: 14-May-1951 Height: 5' 7" (170.2 cm) Weight: 121.3 kg  Insurance Information HMO:     PPO: yes     PCP:      IPA:      80/20:      OTHER:  PRIMARY: [place]      Policy#: [id number]      Subscriber: pt CM Name: [name]      Phone#: [phone]    Fax#: [phone]-Cert#: [id number] CIR given by [name] with BDerby Linewith updates due to her on 8/15 at fax listed above      Employer:  Benefits:  Phone #: [phone]    Name:  Eff. Date: 06/28/20     Deduct: $500 (met $500)      Out of Pocket Max: $4000 (met $4000)      Life Max: n/a CIR: 80%      SNF: 80% Outpatient: 80%     Co Ins: 20% Home Health: 80%      Co-Ins: 20% DME: 80%     Co-Ins: 20% Providers:  SECONDARY: Medicare part A only       Policy#: [id number]    Phone#:   Financial Counselor:       Phone#:   The "Therapist, artInformation Summary" for patients in Inpatient Rehabilitation Facilities with attached "Privacy Act SRenssela[place]Records" was provided and verbally reviewed with: Patient  Emergency Contact Information Contact Information     Name Relation Home Work Mobile   [place]ouse [phone]  [facility] Telshor LLCDaughter   [phone]      Current Medical History  Patient Admitting Diagnosis: debility, discitis/osteomyelitis  History of Present Illness: Marco Encarnacionis a 70year old right-handed male with history significant of obesity with BMI 41.88, back surgery with pelvis fixation in 03/2020, OSA on BiPAP and recent admission 01/10/2021 - 01/16/2021 for sepsis secondary to moderate-sized abscess near the pelvic hardware.  Neurosurgery, IR and ID were involved in his care at that time and underwent wound debridement with neurosurgery on 7/19 with E. coli from wound cultures.  He was given cefazolin with plan 6 weeks of IV treatment by  PICC line.  After patient was discharged to home noted bilateral rib pain needing to sleep in a lift chair.  Wife noted some increased swelling lower extremities and 10 pound weight gain.  Home health nurse 7/25 to assess the PICC line recommended follow-up PCP they decided to try telehealth by that time patient was noting some coughing and nonspecific chest discomfort as well as poor appetite.  He developed a low-grade fever and chills.  Admitted 01/20/2021 with chemistries unremarkable except sodium 134 glucose 162, sedimentation rate 70, procalcitonin 1.01, blood cultures currently no growth to date, gram stain no organisms seen, MRSA nondetected, hemoglobin 8.9, WBC 29,000.  Patient was hypoxic 88% on room air.  CT completed showing empyema and also discitis/osteomyelitis of T10.  Patient did undergo bilateral pigtail catheters placed in pleural cavities.  Echocardiogram with ejection fraction of 65 to 70% no wall motion abnormalities.  On 01/24/2021 right pleural catheter removed he did undergo IR placed left JP drain 01/27/2021.  Neurosurgery follow-up in regards to osteomyelitis no current plan for repeat surgical intervention.  Infectious disease follow-up patient currently remains on cefazolin until 03/11/2021 most recent blood cultures negative.  Hospital course complicated by left knee pseudogout right  knee was aspirated 44 cc of clear fluid and steroid injected most recent cultures negative.  Patient is maintained on Lovenox for DVT prophylaxis.  Leukocytosis has improved 12,900 and monitored.  His hemoglobin remained stable 9.0.  Bouts of hyponatremia 127-132 with urine osmolality 441.  Tolerating a regular consistency diet.  Therapy evaluations completed due to patient decreased functional mobility was recommended for a comprehensive rehab program.     Patient's medical record from [facility] has been reviewed by the rehabilitation admission coordinator and physician.  Past Medical History  Past  Medical History:  Diagnosis Date   Arthritis    arthritis-bilateral knees, left Hip.   Degenerative lumbar spinal stenosis    EKG abnormality    06-08-16- being evaluated by cardiologist  in [place]   Idiopathic scoliosis of lumbar region    Joint stiffness    Knee pain    Leg swelling    Low back pain    Achy, shooting pain.  Hurts to Walk or stand up straight.   Lumbar radiculopathy    Scoliosis concern    Sleep apnea    Bipap use nightly 25/19 -3l/m oxygen   Spondylolisthesis of lumbar region    Transfusion history    [place] ,[place] after bleeding post colon polyp removal and colonscopy procedure    Family History   family history includes Arthritis in his mother; Hypertension in his brother and mother; Stomach cancer in his father.  Prior Rehab/Hospitalizations Has the patient had prior rehab or hospitalizations prior to admission? Yes  Has the patient had major surgery during 100 days prior to admission? Yes   Current Medications  Current Facility-Administered Medications:    0.9 %  sodium chloride infusion, , Intravenous, PRN, Debbe Odea, MD, Last Rate: 10 mL/hr at 02/03/21 0650, Infusion Verify at 02/03/21 0650   acetaminophen (TYLENOL) tablet 650 mg, 650 mg, Oral, Q6H PRN, 650 mg at 02/03/21 0016 **OR** acetaminophen (TYLENOL) suppository 650 mg, 650 mg, Rectal, Q6H PRN, Karmen Bongo, MD   ceFAZolin (ANCEF) IVPB 2g/100 mL premix, 2 g, Intravenous, Q8H, Vu, Trung T, MD, Stopped at 02/03/21 0502   Chlorhexidine Gluconate Cloth 2 % PADS 6 each, 6 each, Topical, Daily, Charlynne Cousins, MD, 6 each at 02/03/21 1032   colchicine tablet 0.6 mg, 0.6 mg, Oral, Daily, Samtani, Jai-Gurmukh, MD, 0.6 mg at 02/03/21 1029   enoxaparin (LOVENOX) injection 40 mg, 40 mg, Subcutaneous, Daily, Monia Sabal, PA-C, 40 mg at 02/03/21 1030   feeding supplement (ENSURE ENLIVE / ENSURE PLUS) liquid 237 mL, 237 mL, Oral, TID BM, Charlynne Cousins, MD, 237 mL at 02/03/21 1032    morphine 4 MG/ML injection 4 mg, 4 mg, Intravenous, Q2H PRN, Candee Furbish, MD, 4 mg at 01/28/21 2054   multivitamin with minerals tablet 1 tablet, 1 tablet, Oral, Daily, Charlynne Cousins, MD, 1 tablet at 02/03/21 1029   ondansetron (ZOFRAN) tablet 4 mg, 4 mg, Oral, Q6H PRN, 4 mg at 01/25/21 1845 **OR** ondansetron (ZOFRAN) injection 4 mg, 4 mg, Intravenous, Q6H PRN, Karmen Bongo, MD, 4 mg at 01/28/21 0118   oxyCODONE (Oxy IR/ROXICODONE) immediate release tablet 5 mg, 5 mg, Oral, Q6H PRN, Karmen Bongo, MD, 5 mg at 02/01/21 2128   polyethylene glycol (MIRALAX / GLYCOLAX) packet 17 g, 17 g, Oral, Daily PRN, Charlynne Cousins, MD, 17 g at 01/30/21 1437   sodium chloride flush (NS) 0.9 % injection 10-40 mL, 10-40 mL, Intracatheter, Q12H, Charlynne Cousins, MD, 10 mL at 02/03/21 1051  sodium chloride flush (NS) 0.9 % injection 10-40 mL, 10-40 mL, Intracatheter, PRN, Charlynne Cousins, MD   sodium chloride flush (NS) 0.9 % injection 3 mL, 3 mL, Intravenous, Q12H, Karmen Bongo, MD, 3 mL at 02/03/21 1052  Patients Current Diet:  Diet Order             Diet - low sodium heart healthy           Diet Heart Room service appropriate? Yes; Fluid consistency: Thin; Fluid restriction: 1200 mL Fluid  Diet effective now                   Precautions / Restrictions Precautions Precautions: Fall Precaution Comments: monitor SpO2; L drain Restrictions Weight Bearing Restrictions: No   Has the patient had 2 or more falls or a fall with injury in the past year? 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 Needed some help immediately prior to admission  Indoor Mobility: Did the patient need assistance with walking from room to room (with or without device)?  Independent  Stairs: Did the patient need assistance with internal or external stairs (with or without device)? Independent  Functional Cognition: Did the patient need help planning regular tasks such as shopping or remembering to take medications? Independent  Home Assistive Devices / Equipment Home Assistive Devices/Equipment: Radio producer (specify quad or straight), Wheelchair, Hand-held shower hose, Built-in shower seat, Eyeglasses Home Equipment: Shower seat, Bedside commode, Walker - 2 wheels, Cane - single point, Environmental consultant - 4 wheels, Other (comment), Toilet riser  Prior Device Use: Indicate devices/aids used by the patient prior to current illness, exacerbation or injury? Walker  Current Functional Level Cognition  Overall Cognitive Status: Within Functional Limits for tasks assessed Current Attention Level: Focused Orientation Level: Oriented X4 Following Commands: Follows one step commands inconsistently General Comments: WFL for basic tasks    Extremity Assessment (includes Sensation/Coordination)  Upper Extremity Assessment: Generalized weakness RUE Deficits / Details: edema, AROM, WFLs LUE Deficits / Details: edema, AROM, WFLs  Lower Extremity Assessment: Defer to PT evaluation RLE Deficits / Details: increased pain in R knee, warm to touch and "spongy" to touch RLE Coordination: decreased fine motor, decreased gross motor    ADLs  Overall ADL's : Needs assistance/impaired Eating/Feeding: Modified independent Grooming: Wash/dry hands, Wash/dry face, Oral care, Brushing hair, Set up, Sitting Grooming Details (indicate cue type and reason): unable to attend to task with eyes open; left with set-upA for spouse to assise Upper Body Bathing: Minimal assistance, Sitting, Cueing for safety, Cueing for sequencing Lower Body Bathing: Maximal assistance, Cueing for safety, Sitting/lateral leans, Sit to/from stand Upper Body Dressing : Minimal assistance, Sitting Lower Body Dressing:  Maximal assistance, Cueing for safety, +2 for physical assistance, +2 for safety/equipment, Sitting/lateral leans, Sit to/from stand Toilet Transfer: Minimal assistance, Stand-pivot, BSC, RW Toilet Transfer Details (indicate cue type and reason): sit to stand only Toileting- Clothing Manipulation and Hygiene: Maximal assistance, +2 for physical assistance, +2 for safety/equipment, Cueing for safety, Cueing for sequencing, Sit to/from stand Functional mobility during ADLs: Minimal assistance, Rolling walker General ADL Comments: Pt alert today, no pain meds prior to session. Pt with increased pain in R knee 9/10 pain. Pt limited by set-upA for light grooming and x1 sit to stand.    Mobility  Overal bed mobility:  Needs Assistance Bed Mobility: Sit to Sidelying Rolling: Mod assist Sidelying to sit: HOB elevated, Min assist Sit to sidelying: Mod assist, +2 for physical assistance General bed mobility comments: ModA for BLE management back into bed    Transfers  Overall transfer level: Needs assistance Equipment used: Rolling walker (2 wheeled) Transfers: Sit to/from Stand Sit to Stand: +2 physical assistance, Mod assist Stand pivot transfers: Min assist General transfer comment: ModA + 2 to rise to stand from recliner x 2. Cues for scooting to edge, hand placement, rocking to gain momentum, "nose over toes."    Ambulation / Gait / Stairs / Wheelchair Mobility  Ambulation/Gait Ambulation/Gait assistance: Min assist, +2 safety/equipment Gait Distance (Feet): 60 Feet (40", 20") Assistive device: Rolling walker (2 wheeled) Gait Pattern/deviations: Step-through pattern, Decreased stride length, Trunk flexed, Shuffle, Decreased stance time - right General Gait Details: Pt with short, shuffling gait pattern, increased trunk/hip flexion, decreased bilateral foot clearance. MinA for balance, cues for activity pacing, close chair follow. Ambulating 40 ft then an additional 20 ft with seated rest break  in between Gait velocity: reduced Gait velocity interpretation: <1.31 ft/sec, indicative of household ambulator    Posture / Balance Dynamic Sitting Balance Sitting balance - Comments: minguardA for safety Balance Overall balance assessment: Needs assistance Sitting-balance support: Feet supported, No upper extremity supported Sitting balance-Leahy Scale: Fair Sitting balance - Comments: minguardA for safety Standing balance support: Bilateral upper extremity supported, During functional activity Standing balance-Leahy Scale: Poor Standing balance comment: Reliant on UE support on RW and physical assistance.    Special needs/care consideration Oxygen up to 2L in hospital   Previous Home Environment (from acute therapy documentation) Living Arrangements: Spouse/significant other Available Help at Discharge: Family, Available 24 hours/day Type of Home: House Home Layout: Two level, Able to live on main level with bedroom/bathroom Alternate Level Stairs-Number of Steps: flight Home Access: Stairs to enter Entrance Stairs-Rails: Right, Can reach both Entrance Stairs-Number of Steps: approximately 14 total (6-7 steps followed by a walkway then 6-7 additional steps) Bathroom Shower/Tub: Tub/shower unit Bathroom Toilet: Handicapped height Home Care Services: No Additional Comments: 1 step up to enter main level if wife drives pt around to back door driving through neighbor's yard, which they do occasionally  Discharge Living Setting Plans for Discharge Living Setting: Patient's home, Lives with (comment) (spouse, Kathy) Type of Home at Discharge: House Discharge Home Layout: Able to live on main level with bedroom/bathroom Discharge Home Access: Level entry (through back entrance) Discharge Bathroom Shower/Tub: Tub/shower unit Discharge Bathroom Toilet: Handicapped height Discharge Bathroom Accessibility: Yes How Accessible: Accessible via walker Does the patient have any problems  obtaining your medications?: No  Social/Family/Support Systems Patient Roles: Spouse Anticipated Caregiver: Kathy Anderson (spouse) Anticipated Caregiver's Contact Information: [phone] Ability/Limitations of Caregiver: supervision to min assist Caregiver Availability: 24/7 Discharge Plan Discussed with Primary Caregiver: Yes Is Caregiver In Agreement with Plan?: Yes Does Caregiver/Family have Issues with Lodging/Transportation while Pt is in Rehab?: No  Goals Patient/Family Goal for Rehab: PT/OT supervision to mod I Expected length of stay: 12-16 days Pt/Family Agrees to Admission and willing to participate: Yes Program Orientation Provided & Reviewed with Pt/Caregiver Including Roles  & Responsibilities: Yes  Barriers to Discharge: Insurance for SNF coverage  Decrease burden of Care through IP rehab admission: n/a  Possible need for SNF placement upon discharge: not anticipated  Patient Condition: I have reviewed medical records from [facility], spoken with CM, and patient and spouse. I met with patient at the bedside   for inpatient rehabilitation assessment.  Patient will benefit from ongoing PT and OT, can actively participate in 3 hours of therapy a day 5 days of the week, and can make measurable gains during the admission.  Patient will also benefit from the coordinated team approach during an Inpatient Acute Rehabilitation admission.  The patient will receive intensive therapy as well as Rehabilitation physician, nursing, social worker, and care management interventions.  Due to safety, skin/wound care, disease management, medication administration, pain management, and patient education the patient requires 24 hour a day rehabilitation nursing.  The patient is currently min to mod assist with mobility and up to max assist with basic ADLs.  Discharge setting and therapy post discharge at home with home health is anticipated.  Patient has agreed to participate in the Acute Inpatient  Rehabilitation Program and will admit today.  Preadmission Screen Completed By:  Michel Santee, PT, DPT 02/03/2021 12:56 PM ______________________________________________________________________   Discussed status with Dr. Dagoberto Ligas on 02/03/21  at 1:26 PM  and received approval for admission today.  Admission Coordinator:  Michel Santee, PT, DPT, time 1:26 PM Sudie Grumbling 02/03/21    Assessment/Plan: Diagnosis: Does the need for close, 24 hr/day Medical supervision in concert with the patient's rehab needs make it unreasonable for this patient to be served in a less intensive setting? Yes Co-Morbidities requiring supervision/potential complications: osteomyelitis of T10; JP drain; need for IV ABX; LE B/L foot drop; s/p chest tubes Due to bladder management, bowel management, safety, skin/wound care, disease management, medication administration, pain management, and patient education, does the patient require 24 hr/day rehab nursing? Yes Does the patient require coordinated care of a physician, rehab nurse, PT, OT, and SLP to address physical and functional deficits in the context of the above medical diagnosis(es)? Yes Addressing deficits in the following areas: balance, endurance, locomotion, strength, transferring, bowel/bladder control, bathing, dressing, feeding, grooming, and toileting Can the patient actively participate in an intensive therapy program of at least 3 hrs of therapy 5 days a week? Yes The potential for patient to make measurable gains while on inpatient rehab is good Anticipated functional outcomes upon discharge from inpatient rehab: modified independent and supervision PT, modified independent and supervision OT, n/a SLP Estimated rehab length of stay to reach the above functional goals is: 12-16 days Anticipated discharge destination: Home 10. Overall Rehab/Functional Prognosis: good   MD Signature:

## 2021-02-03 NOTE — Progress Notes (Signed)
RT set up patient's home CPAP with 2L O2 bled into circuit. CPAP is on bedside table. Patient stated he will place himself on when ready. RT instructed patient to have RT called if he needs assistance. RN is aware. RT will monitor as needed.

## 2021-02-03 NOTE — Progress Notes (Signed)
Inpatient Rehab Admissions Coordinator:   Awaiting BCBS for authorization for pt to admit to CIR.   Estill Dooms, PT, DPT Admissions Coordinator [phone] 02/03/21  12:52 PM

## 2021-02-03 NOTE — Discharge Summary (Signed)
Physician Discharge Summary  Marco Kennedy GGE:[id number] DOB: 01-13-1951 DOA: 01/20/2021  PCP: Yvone Neu, MD  Admit date: 01/20/2021 Discharge date: 02/03/2021  Admitted From: home  Disposition:  CIR   Recommendations for Outpatient Follow-up:  F/u on right knee edema  IR will f/u on left retroperitoneal drain   Discharge Condition:  stable   CODE STATUS:  full code   Diet recommendation:  heart health Consultations: ID  NS   Procedures/Studies: PICC line   Discharge Diagnoses:  Principal Problem:   Sepsis due to Escherichia coli (E. coli) ([place]) Active Problems:   Pseudogout of knee, right   Vertebral osteomyelitis ([place])   Hardware complicating wound infection ([place])   Pleural effusion   Anasarca   OSA (obstructive sleep apnea)   Obesity, Class III, BMI 40-49.9 (morbid obesity) ([place])   Empyema ([place])     Brief Summary: Marco Kennedy is a 70 y/o male with morbid obesity, OSA on BipAP, s/p back surgery on 10/21, recent lumbar abscess s/p ORIF on 7/19 who grew E coli and was sent home with Ancef for 6 wks. He returned to the ED on 7/26 with chills and back pain and was found to be tachycardic, tachypnea, hypoxic with pulse ox of 88%, b/l empyema  T 10 osteomyelitis and started on Vanc. Later found to have a psoas abscess leading to urinary retention. - 7/26> b/l pigtail catheters placed in pleural cavities - 7/28 left pleural catheter removed - @D  ECHO neg for endocarditis - 7/29 fevers - 7/30 right pleural catheter removed - 8/1 retroperitoneal fluid collection about 10 cm - 8/2 IR placed L JP drain - 8/3 urinary and fecal incontinence, decreased sensation in b/l LE, right knee effusion - 29m pale yellow fluid obtained- found to be consistent with gout- Colchicine started  Hospital Course:  Principal Problem:   Sepsis due to Escherichia coli (E. coli)- psoas abscess   Vertebral T 10 osteomyelitis - Hardware complicating wound infection - NS does not  plan to repeat surgery - mass effect on spinal cord, new abnormal signal at T9-T10 - b/l empyema, left psoas abscess - fluid from psoas abscess sent for culture- no bacteria noted and none cultured out thus far - blood cultures from 7/29 neg - cont Cefazolin until 03/11/21 - PICC line in place - plan for CIR   Active Problems:   Left knee pseudo-gout - per ID, cannot rule out septic arthritis - Knee was aspirated (44 cc of clear fluid) and steroids were injected and pain has improved - culture negative so far    Obesity/ OSA Body mass index is 41.88 kg/m.   Constipation - Continue MiraLAX   Discharge Exam: Vitals:   02/03/21 0813 02/03/21 1146  BP: (!) 154/79 126/75  Pulse: 86 (!) 112  Resp: 20 20  Temp: 98 F (36.7 C) 99.9 F (37.7 C)  SpO2: 100% 98%   Vitals:   02/03/21 0153 02/03/21 0404 02/03/21 0813 02/03/21 1146  BP:  134/73 (!) 154/79 126/75  Pulse:  70 86 (!) 112  Resp:  16 20 20   Temp:  98.9 F (37.2 C) 98 F (36.7 C) 99.9 F (37.7 C)  TempSrc:  Oral    SpO2:  94% 100% 98%  Weight:      Height: 5' 7"  (1.702 m)       General: Pt is alert, awake, not in acute distress Cardiovascular: RRR, S1/S2 +, no rubs, no gallops Respiratory: CTA bilaterally, no wheezing, no rhonchi Abdominal: Soft,  NT, ND, bowel sounds + Extremities: right knee swelling noted- mildly tender- no cyanosis   Discharge Instructions  Discharge Instructions     Diet - low sodium heart healthy   Complete by: As directed    Increase activity slowly   Complete by: As directed    No wound care   Complete by: As directed       Allergies as of 02/03/2021   No Known Allergies      Medication List     STOP taking these medications    diclofenac 75 MG EC tablet Commonly known as: VOLTAREN   oxyCODONE 5 MG immediate release tablet Commonly known as: Oxy IR/ROXICODONE       TAKE these medications    acetaminophen 325 MG tablet Commonly known as: TYLENOL Take 2  tablets (650 mg total) by mouth every 6 (six) hours as needed for mild pain (or Fever >/= 101).   ceFAZolin  IVPB Commonly known as: ANCEF Inject 2 g into the vein every 8 (eight) hours. Indication:  vertebral osteomyelitis First Dose: No Last Day of Therapy:  02/24/2021 Labs - Once weekly:  CBC/D and BMP, Labs - Every other week:  ESR and CRP Method of administration: IV Push Method of administration may be changed at the discretion of home infusion pharmacist based upon assessment of the patient and/or caregiver's ability to self-administer the medication ordered.   cholecalciferol 25 MCG (1000 UNIT) tablet Commonly known as: VITAMIN D3 Take 1,000 Units by mouth daily.   colchicine 0.6 MG tablet Take 1 tablet (0.6 mg total) by mouth daily.   feeding supplement Liqd Take 237 mLs by mouth 3 (three) times daily between meals.   multivitamin with minerals Tabs tablet Take 1 tablet by mouth daily.   polyethylene glycol 17 g packet Commonly known as: MIRALAX / GLYCOLAX Take 17 g by mouth 2 (two) times daily.        No Known Allergies    DG Chest 1 View  Result Date: 01/20/2021 CLINICAL DATA:  Status post bilateral chest tube placement after spread of spinal infection to the chest. EXAM: CHEST  1 VIEW COMPARISON:  Single-view of the chest 01/10/2021. FINDINGS: Pigtail catheters are in place bilaterally. The patient also has a right PICC with its tip in the upper to mid superior vena cava. No pneumothorax. Mild basilar opacities and small bilateral pleural effusions noted. Heart size is normal. IMPRESSION: Bilateral chest tubes in place.  No pneumothorax. Small bilateral pleural effusions. Mild basilar airspace disease is likely atelectasis. Tip of right PICC projects in the upper to mid superior vena cava. Electronically Signed   By: Inge Rise M.D.   On: 01/20/2021 19:53   MR THORACIC SPINE W WO CONTRAST  Result Date: 01/28/2021 CLINICAL DATA:  Discitis-osteomyelitis EXAM:  MRI THORACIC AND LUMBAR SPINE WITHOUT AND WITH CONTRAST TECHNIQUE: Multiplanar and multiecho pulse sequences of the thoracic and lumbar spine were obtained without and with intravenous contrast. CONTRAST:  80m GADAVIST GADOBUTROL 1 MMOL/ML IV SOLN COMPARISON:  01/11/2021 FINDINGS: MRI THORACIC SPINE FINDINGS Alignment:  Physiologic. Vertebrae: Spinal fusion hardware begins at T10. More superior levels are normal. Cord: Normal signal and morphology. There is a ventral epidural collection that begins at the level of the T7-8 disc space and extends inferiorly to T10-11, unchanged in craniocaudal extent. AP thickness measures approximately 5 mm. Paraspinal and other soft tissues: Loculated bilateral pleural collections. Small amount of fluid adjacent to the left T12 spinal rod. The fluid at the right  spinal rod has substantially resolved. Disc levels: There is abnormal disc signal at T9-10 and T10-11. The T10-11 finding is unchanged but the T9-10 abnormality is new. Circumferential dural thickening and enhancement at the T10 level effaces the thecal sac. This is poorly visualized on the prior study on multiple sequences, but appears to be unchanged based on the axial T2-weighted imaging. MRI LUMBAR SPINE FINDINGS Segmentation:  Standard Alignment:  Normal Vertebrae: Posterior fusion hardware extends inferiorly to the sacrum. There are interbody spacers at L4-5 and L5-S1. Conus medullaris: Extends to the L1-2 level and appears normal. Paraspinal and other soft tissues: Negative. Disc levels: Degenerative findings are unchanged since 01/11/2021 L1-2: Normal disc.  No stenosis. L2-3: Disc desiccation without spinal canal stenosis. L3-4: Posterior decompression no spinal canal stenosis. L4-5: Posterior decompression.  Mild left foraminal stenosis. L5-S1: No disc herniation.  Mild left foraminal stenosis. IMPRESSION: 1. Unchanged appearance of ventral epidural abscess extending from T7-T11 with likely source at the T10-11  disc space. Circumferential dural thickening at the T10 level again effaces the thecal sac with mass effect on the spinal cord. 2. New abnormal disc signal at T9-10 and unchanged abnormal signal T10-11, consistent with discitis-osteomyelitis. 3. No lumbar spinal canal stenosis. 4. Loculated bilateral pleural collections. Electronically Signed   By: Ulyses Jarred M.D.   On: 01/28/2021 23:33   MR THORACIC SPINE W WO CONTRAST  Result Date: 01/11/2021 CLINICAL DATA:  70 year old male with fever and "low back abscess". Prior spine surgery in 2021. Approximately 1 week of increasing left side back and flank pain. Found to have large 9.6 x 7.7 x 10.8 cm left flank or pelvic loculated fluid collection at outside facility suspicious for seroma versus abscess EXAM: MRI CERVICAL, THORACIC AND LUMBAR SPINE WITHOUT AND WITH CONTRAST TECHNIQUE: Multiplanar and multiecho pulse sequences of the cervical spine, to include the craniocervical junction and cervicothoracic junction, and thoracic and lumbar spine, were obtained without and with intravenous contrast. CONTRAST:  26m GADAVIST GADOBUTROL 1 MMOL/ML IV SOLN COMPARISON:  Scoliosis radiographs 08/14/2020 and earlier. FINDINGS: MRI THORACIC SPINE FINDINGS Limited cervical spine imaging: Straightening of cervical lordosis. Generalized decreased T1 bone marrow signal. Thoracic spine segmentation:  Normal on the comparison radiographs. Alignment:  Preserved thoracic kyphosis. Vertebrae: Mild generalized decreased T1 marrow signal in the thoracic spine, although remaining within normal limits from T9 and above. Superimposed T10 through lumbar posterior spinal hardware artifact. No convincing T10 through T12 marrow edema on the thoracic images. Cord: Abnormal dural thickening and enhancement extending from the lower thoracic spine which is affected by hardware artifact cephalad through the T4 level. Beginning at T7 and continuing inferiorly is a ventral epidural fluid collection  with rim enhancement measuring up to 6 mm in thickness (at T8 series 27, image 25) which effaces the ventral CSF space at many lower thoracic levels. This continues toward the thoracolumbar junction, although thecal sac detail is limited by hardware artifact at T11 and T12. No definite associated cord edema. Above T4 the thecal sac and dura appear normal. Conus medullaris is obscured. Paraspinal and other soft tissues: Muscle postoperative changes. Suggesting high protein content or blood. There is surrounding right thoracic erector spinae muscle edema, superimposed on chronic postoperative changes. Small to moderate layering bilateral pleural effusions are visible in the chest. Disc levels: Disc spaces are relatively normal for age above T9. Beginning in the midthoracic spine and continuing inferiorly is moderate and ultimately severe bilateral posterior facet hypertrophy. Beginning at T8-T9 there is multifactorial mild spinal stenosis in  conjunction with the ventral epidural abscess. Similar mild spinal stenosis at T9-T10. The T10-T11 disc is abnormally T2 and STIR hyperintense, although evaluation for enhancement is limited due to hardware artifact. Possible mild endplate erosions there. Mild to moderate spinal stenosis there in part due to mass effect from moderate to severe posterior element hypertrophy on the right (series 27, image 32). T11-T12: disc space loss with disc bulging and endplate spurring. Up to moderate multifactorial spinal stenosis there. T12-L1 level is slightly better depicted on the lumbar images. MRI LUMBAR SPINE FINDINGS Segmentation:  Normal as seen on the comparison radiographs. Alignment:  Preserved lumbar lordosis. Vertebrae: Diffuse hardware susceptibility artifact, with interbody implants in addition to the posterior spinal hardware from L2-L3 through L5-S1. No definite lumbar marrow edema. No convincing sacral marrow edema. Grossly intact SI joints. Conus medullaris and cauda  equina: Conus is obscured by hardware artifact. Cauda equina roots so in the lower lumbar thecal sac appear normal. Hardware artifact limits evaluation for abnormal dural thickening and enhancement in the lumbar spine, but the dura below L3 appears normal. Paraspinal and other soft tissues: Posterior paraspinal muscle postoperative changes. A small roughly 3 cm collection along the right side posterior T12 and L1 hardware has intrinsic T1 and T2 hyperintensity as above. Otherwise there is thin midline subcutaneous fluid or granulation tissue which is not rim enhancing and fairly dark on T2. No psoas muscle abscess identified. Disc levels: T12-L1: This level remains fairly obscured due to hardware artifact, but on series 30, image 7 there does appear to be at least mild spinal stenosis. Unclear whether the epidural collection extends to this level. L1-L2: Obscured on axial images but on sagittal series 30, image 8 there is no strong evidence of epidural abscess or spinal stenosis. L2-L3: Obscured on axial images. Sagittal images demonstrate interbody implant with no convincing epidural abscess. There does appear to be mild spinal stenosis. L3-L4: Sequelae of decompression and posterior, interbody fusion here. No definite spinal stenosis or epidural abscess. L4-L5: Decompression and fusion. Left side posterior element hypertrophy results in mild left L4 foraminal stenosis. No spinal stenosis or epidural abscess. L5-S1: Status post decompression and fusion. No stenosis or abscess. IMPRESSION: 1. Previous T10 through Sacral spinal fusion with abundant metal hardware artifact. 2. Positive for a Ventral Epidural Abscess in the thoracic spine tracking from the T7 superior endplate inferiorly at least to T10-T11 - where abnormal signal in the disc is highly suspicious for Infectious Discitis. Associated meningeal thickening and enhancement extending from that level cephalad to T4. 3. Associated multilevel Lower Thoracic  Spinal Stenosis due to combined abscess and degenerative changes, up to moderate at T10-T11 and T11-T12. 4. No definite spinal cord edema, although detail of the conus is obscured by hardware artifact. 5. Solitary 3 cm posterior paraspinal fluid collection about the right T12 and L1 spinal rod, although fluid signal more resembles hematoma than abscess. 6. Otherwise extensive postoperative signal changes to the thoracolumbar erector spinae muscles, with ano obvious intramuscular abscess. 7. No definite lumbar epidural abscess. Visible sacrum and SI joints also appear spared. Electronically Signed   By: Genevie Ann M.D.   On: 01/11/2021 04:44   MR Lumbar Spine W Wo Contrast  Result Date: 01/28/2021 CLINICAL DATA:  Discitis-osteomyelitis EXAM: MRI THORACIC AND LUMBAR SPINE WITHOUT AND WITH CONTRAST TECHNIQUE: Multiplanar and multiecho pulse sequences of the thoracic and lumbar spine were obtained without and with intravenous contrast. CONTRAST:  56m GADAVIST GADOBUTROL 1 MMOL/ML IV SOLN COMPARISON:  01/11/2021 FINDINGS: MRI  THORACIC SPINE FINDINGS Alignment:  Physiologic. Vertebrae: Spinal fusion hardware begins at T10. More superior levels are normal. Cord: Normal signal and morphology. There is a ventral epidural collection that begins at the level of the T7-8 disc space and extends inferiorly to T10-11, unchanged in craniocaudal extent. AP thickness measures approximately 5 mm. Paraspinal and other soft tissues: Loculated bilateral pleural collections. Small amount of fluid adjacent to the left T12 spinal rod. The fluid at the right spinal rod has substantially resolved. Disc levels: There is abnormal disc signal at T9-10 and T10-11. The T10-11 finding is unchanged but the T9-10 abnormality is new. Circumferential dural thickening and enhancement at the T10 level effaces the thecal sac. This is poorly visualized on the prior study on multiple sequences, but appears to be unchanged based on the axial T2-weighted  imaging. MRI LUMBAR SPINE FINDINGS Segmentation:  Standard Alignment:  Normal Vertebrae: Posterior fusion hardware extends inferiorly to the sacrum. There are interbody spacers at L4-5 and L5-S1. Conus medullaris: Extends to the L1-2 level and appears normal. Paraspinal and other soft tissues: Negative. Disc levels: Degenerative findings are unchanged since 01/11/2021 L1-2: Normal disc.  No stenosis. L2-3: Disc desiccation without spinal canal stenosis. L3-4: Posterior decompression no spinal canal stenosis. L4-5: Posterior decompression.  Mild left foraminal stenosis. L5-S1: No disc herniation.  Mild left foraminal stenosis. IMPRESSION: 1. 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 Circumferential dural thickening at the T10 level again effaces the thecal sac with mass effect on the spinal cord. 2. New abnormal disc signal at T9-10 and unchanged abnormal signal T10-11, consistent with discitis-osteomyelitis. 3. No lumbar spinal canal stenosis. 4. Loculated bilateral pleural collections. Electronically Signed   By: Ulyses Jarred M.D.   On: 01/28/2021 23:33   MR Lumbar Spine W Wo Contrast  Result Date: 01/11/2021 CLINICAL DATA:  70 year old male with fever and "low back abscess". Prior spine surgery in 2021. Approximately 1 week of increasing left side back and flank pain. Found to have large 9.6 x 7.7 x 10.8 cm left flank or pelvic loculated fluid collection at outside facility suspicious for seroma versus abscess EXAM: MRI CERVICAL, THORACIC AND LUMBAR SPINE WITHOUT AND WITH CONTRAST TECHNIQUE: Multiplanar and multiecho pulse sequences of the cervical spine, to include the craniocervical junction and cervicothoracic junction, and thoracic and lumbar spine, were obtained without and with intravenous contrast. CONTRAST:  14m GADAVIST GADOBUTROL 1 MMOL/ML IV SOLN COMPARISON:  Scoliosis radiographs 08/14/2020 and earlier. FINDINGS: MRI  THORACIC SPINE FINDINGS Limited cervical spine imaging: Straightening of cervical lordosis. Generalized decreased T1 bone marrow signal. Thoracic spine segmentation:  Normal on the comparison radiographs. Alignment:  Preserved thoracic kyphosis. Vertebrae: Mild generalized decreased T1 marrow signal in the thoracic spine, although remaining within normal limits from T9 and above. Superimposed T10 through lumbar posterior spinal hardware artifact. No convincing T10 through T12 marrow edema on the thoracic images. Cord: Abnormal dural thickening and enhancement extending from the lower thoracic spine which is affected by hardware artifact cephalad through the T4 level. Beginning at T7 and continuing inferiorly is a ventral epidural fluid collection with rim enhancement measuring up to 6 mm in thickness (at T8 series 27, image 25) which effaces the ventral CSF space at many lower thoracic levels. This continues toward the thoracolumbar junction, although thecal sac detail is limited by hardware artifact at T11 and T12. No definite associated cord edema. Above T4 the thecal sac and dura appear normal. Conus medullaris is  obscured. Paraspinal and other soft tissues: Muscle postoperative changes. Suggesting high protein content or blood. There is surrounding right thoracic erector spinae muscle edema, superimposed on chronic postoperative changes. Small to moderate layering bilateral pleural effusions are visible in the chest. Disc levels: Disc spaces are relatively normal for age above T9. Beginning in the midthoracic spine and continuing inferiorly is moderate and ultimately severe bilateral posterior facet hypertrophy. Beginning at T8-T9 there is multifactorial mild spinal stenosis in conjunction with the ventral epidural abscess. Similar mild spinal stenosis at T9-T10. The T10-T11 disc is abnormally T2 and STIR hyperintense, although evaluation for enhancement is limited due to hardware artifact. Possible mild  endplate erosions there. Mild to moderate spinal stenosis there in part due to mass effect from moderate to severe posterior element hypertrophy on the right (series 27, image 32). T11-T12: disc space loss with disc bulging and endplate spurring. Up to moderate multifactorial spinal stenosis there. T12-L1 level is slightly better depicted on the lumbar images. MRI LUMBAR SPINE FINDINGS Segmentation:  Normal as seen on the comparison radiographs. Alignment:  Preserved lumbar lordosis. Vertebrae: Diffuse hardware susceptibility artifact, with interbody implants in addition to the posterior spinal hardware from L2-L3 through L5-S1. No definite lumbar marrow edema. No convincing sacral marrow edema. Grossly intact SI joints. Conus medullaris and cauda equina: Conus is obscured by hardware artifact. Cauda equina roots so in the lower lumbar thecal sac appear normal. Hardware artifact limits evaluation for abnormal dural thickening and enhancement in the lumbar spine, but the dura below L3 appears normal. Paraspinal and other soft tissues: Posterior paraspinal muscle postoperative changes. A small roughly 3 cm collection along the right side posterior T12 and L1 hardware has intrinsic T1 and T2 hyperintensity as above. Otherwise there is thin midline subcutaneous fluid or granulation tissue which is not rim enhancing and fairly dark on T2. No psoas muscle abscess identified. Disc levels: T12-L1: This level remains fairly obscured due to hardware artifact, but on series 30, image 7 there does appear to be at least mild spinal stenosis. Unclear whether the epidural collection extends to this level. L1-L2: Obscured on axial images but on sagittal series 30, image 8 there is no strong evidence of epidural abscess or spinal stenosis. L2-L3: Obscured on axial images. Sagittal images demonstrate interbody implant with no convincing epidural abscess. There does appear to be mild spinal stenosis. L3-L4: Sequelae of decompression  and posterior, interbody fusion here. No definite spinal stenosis or epidural abscess. L4-L5: Decompression and fusion. Left side posterior element hypertrophy results in mild left L4 foraminal stenosis. No spinal stenosis or epidural abscess. L5-S1: Status post decompression and fusion. No stenosis or abscess. IMPRESSION: 1. Previous T10 through Sacral spinal fusion with abundant metal hardware artifact. 2. Positive for a Ventral Epidural Abscess in the thoracic spine tracking from the T7 superior endplate inferiorly at least to T10-T11 - where abnormal signal in the disc is highly suspicious for Infectious Discitis. Associated meningeal thickening and enhancement extending from that level cephalad to T4. 3. 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 4. No definite spinal cord edema, although detail of the conus is obscured by hardware artifact. 5. Solitary 3 cm posterior paraspinal fluid collection about the right T12 and L1 spinal rod, although fluid signal more resembles hematoma than abscess. 6. Otherwise extensive postoperative signal changes to the thoracolumbar erector spinae muscles, with ano obvious intramuscular abscess. 7. No definite lumbar epidural abscess. Visible sacrum  and SI joints also appear spared. Electronically Signed   By: Genevie Ann M.D.   On: 01/11/2021 04:44   MR KNEE RIGHT W WO CONTRAST  Result Date: 01/30/2021 CLINICAL DATA:  Right knee pain for 3 days. EXAM: MRI OF THE RIGHT KNEE WITHOUT AND WITH CONTRAST TECHNIQUE: Multiplanar, multisequence MR imaging of the knee was performed before and after the administration of intravenous contrast. CONTRAST:  21m GADAVIST GADOBUTROL 1 MMOL/ML IV SOLN COMPARISON:  None. FINDINGS: Exam limited by patient motion. MENISCI Medial meniscus:  Degenerative changes without obvious tear. Lateral meniscus:  Probable chronic tear or prior meniscectomy.  LIGAMENTS Cruciates:  Chronic ACL deficient knee.  PCL is intact. Collaterals:  Intact CARTILAGE Patellofemoral: Advanced degenerative chondrosis with joint space narrowing and spurring. Medial:  Moderate degenerative chondrosis. Lateral: Advanced degenerative chondrosis with full-thickness cartilage loss, joint space narrowing and spurring. Joint: There is a moderate to large joint effusion with moderate irregular surrounding synovial enhancement. Certainly could not exclude septic arthritis but this could also be non infectious synovitis. Recommend knee joint aspiration for further evaluation. Popliteal Fossa:  No popliteal mass or Baker's cyst. Extensor Mechanism: The patella retinacular structures are intact and the quadriceps and patellar tendons are intact. Bones: No findings suspicious for osteomyelitis. No marrow edema or abnormal enhancement. Other: Subcutaneous soft tissue swelling/edema could suggest cellulitis. There is also moderate edema like signal changes in the vastus medialis muscle and also in the posterior compartment muscles. Findings could suggest infectious myositis. No findings suspicious for pyomyositis. IMPRESSION: 1. Exam limited by patient motion. 2. Moderate to large joint effusion with moderate irregular surrounding synovial enhancement. Certainly could not exclude septic arthritis but this could also be non infectious synovitis. Recommend knee joint aspiration for further evaluation. 3. No findings suspicious for osteomyelitis. 4. Probable cellulitis and myositis without discrete soft tissue abscess or pyomyositis. 5. Chronic ACL deficient knee. 6. Tricompartmental degenerative changes most significant in the lateral. Electronically Signed   By: PMarijo SanesM.D.   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 Sagittal and coronal MPR images reconstructed from axial data set. CONTRAST:  1047mOMNIPAQUE IOHEXOL 300 MG/ML SOLN IV. No oral contrast. COMPARISON:  CT abdomen and pelvis 01/10/2021 FINDINGS: CT CHEST FINDINGS Cardiovascular: Minimal atherosclerotic calcification aorta without aneurysm. Heart unremarkable. No pericardial effusion. Pulmonary arteries grossly patent on nondedicated exam. Mediastinum/Nodes: No thoracic adenopathy. RIGHT arm PICC line with tip in SVC. Base of cervical region normal appearance. Esophagus unremarkable. Lungs/Pleura: Small loculated pleural fluid collections bilaterally RIGHT greater than LEFT which contain foci of air, could either represent empyema or prior instrumentation/aspiration. Compressive atelectasis in the lower lobes bilaterally. Loculated fluid versus less likely mass at medial RIGHT apex, recommend attention on follow-up imaging, 2.3 x 2.1 cm image 10, fluid attenuation. No infiltrate or additional mass identified. Musculoskeletal: Spinal fixation hardware T10-S2. Thoracic portion of hardware appears intact. Lucency seen surrounding pedicle screws at T10 adjacent to loculated pleural collection at the inferior RIGHT chest medially favor infected hardware. CT ABDOMEN PELVIS FINDINGS Hepatobiliary: Gallbladder and liver normal appearance Pancreas: Normal appearance Spleen: Normal appearance Adrenals/Urinary Tract: Adrenal glands, kidneys,  ureters, and bladder normal appearance Stomach/Bowel: Normal appendix. Stomach and bowel loops normal appearance. Vascular/Lymphatic: Atherosclerotic calcifications aorta and iliac arteries without aneurysm. Normal sized retroperitoneal nodes. Reproductive: Unremarkable  seminal vesicles and prostate gland Other: No free air or free fluid. Large fluid collection again identified LEFT retroperitoneal 10.1 x 6.4 x 7.1 cm, could represent infected or sterile collection. Trace free pelvic fluid. Scattered stranding of subcutaneous tissue planes. Musculoskeletal: Orthopedic hardware T10-S2. LEFT hip prosthesis. Probable bone island RIGHT femoral neck. IMPRESSION: Small loculated pleural fluid collections bilaterally RIGHT greater than LEFT which contain foci of air, could either represent empyema or prior instrumentation/aspiration, slightly increased. Compressive atelectasis in the lower lobes. Loculated fluid versus less likely mass at medial RIGHT apex 2.3 x 2.1 cm; recommend attention on follow-up imaging. Large LEFT retroperitoneal fluid collection 10.1 x 6.4 x 7.1 cm little changed, could represent infected or sterile collection. Lucency surrounding the pedicle screws bilaterally at T10 favoring infection, question slightly increased on RIGHT since previous study. Aortic Atherosclerosis (ICD10-I70.0). Electronically Signed   By: Lavonia Dana M.D.   On: 01/26/2021 11:40   [place] COMPLETE JOINT SPACE STRUCTURES LOW RIGHT  Result Date: 01/28/2021 CLINICAL DATA:  Swelling assess for knee effusion EXAM: ULTRASOUND right LOWER EXTREMITY LIMITED TECHNIQUE: Ultrasound examination of the lower extremity soft tissues was performed in the area of clinical concern. COMPARISON:  None. FINDINGS: Ultrasound evaluation of the right knee performed in the region of redness and swelling. Diffuse subcutaneous edema. Large complex fluid collection superior to the patella measuring 8.8 x 2.7 x 2.8 cm with estimated volume of 28 mL. IMPRESSION: Subcutaneous edema with large complex fluid collection at the anterior knee superior to the patella which may reflect infected or inflammatory fluid collection, or potentially hematoma. This would be better characterized by MRI. Electronically Signed   By: Donavan Foil M.D.   On: 01/28/2021 17:12   DG CHEST PORT 1 VIEW  Result Date: 01/23/2021 CLINICAL DATA:  Pleural effusion. EXAM: PORTABLE CHEST 1 VIEW COMPARISON:  01/22/2021 FINDINGS: A right chest tube is unchanged. The left chest tube has been removed. A right PICC remains in place. The cardiac silhouette remains mildly enlarged. Bibasilar opacities are unchanged and likely represent a combination of small pleural effusions and atelectasis. No pneumothorax is identified. IMPRESSION: Interval left chest tube removal. Unchanged small pleural effusions and bibasilar atelectasis. No pneumothorax. Electronically Signed   By: Logan Bores M.D.   On: 01/23/2021 08:23   DG CHEST PORT 1 VIEW  Result Date: 01/22/2021 CLINICAL DATA:  Pleural effusion.  Bilateral chest tubes. EXAM: PORTABLE CHEST 1 VIEW COMPARISON:  01/20/2021 FINDINGS: Bilateral chest tubes in stable position. No pneumothorax. Right PICC line in stable position. Stable cardiomegaly. Persistent bibasilar atelectasis/infiltrates and small bilateral pleural effusions. Degenerative change thoracic spine. Prior thoracolumbar spine fusion. IMPRESSION: 1.  Bilateral chest tubes in stable position.  No pneumothorax. 2. Persistent bibasilar atelectasis/infiltrates and small bilateral pleural effusions. 3.  Stable cardiomegaly. Electronically Signed   By: Marcello Moores  Register   On: 01/22/2021 06:25   DG Chest Portable 1 View  Result Date: 01/10/2021 CLINICAL DATA:  Fever, lower back abscess EXAM: PORTABLE CHEST 1 VIEW COMPARISON:  11/13/2007 FINDINGS: Single frontal view of the chest demonstrates an unremarkable cardiac silhouette. No airspace disease, effusion, or pneumothorax. Postsurgical changes are seen at the thoracolumbar junction. No acute displaced fractures. IMPRESSION: 1. No acute intrathoracic process. Electronically Signed   By: Randa Ngo M.D.   On: 01/10/2021 22:58   ECHOCARDIOGRAM COMPLETE  Result Date: 01/21/2021    ECHOCARDIOGRAM REPORT    Patient Name:   Marco Kennedy Date of Exam: 01/21/2021 Medical Rec #:  [id number]        Height:       67.0 in Accession #:    [id number]       Weight:       269.4 lb Date of Birth:  Jun 05, 1951        BSA:          2.295 m Patient Age:    38 years         BP:           144/73 mmHg Patient Gender: M                HR:           109 bpm. Exam Location:  Inpatient Procedure: 2D Echo, Color Doppler, Cardiac Doppler and Intracardiac            Opacification Agent Indications:    Endocarditis  History:        Patient has no prior history of Echocardiogram examinations.  Sonographer:    Bernadene Person RDCS Referring Phys: [place]  1. Left ventricular ejection fraction, by estimation, is 65 to 70%. The left ventricle has normal function. The left ventricle has no regional wall motion abnormalities. There is mild left ventricular hypertrophy. Left ventricular diastolic parameters were normal.  2. Right ventricular systolic function is normal. The right ventricular size is normal. Tricuspid regurgitation signal is inadequate for assessing PA pressure.  3. The mitral valve is normal in structure. No evidence of mitral valve regurgitation.  4. The aortic valve was not well visualized. Aortic valve regurgitation is not visualized. No aortic stenosis is present.  5. The inferior vena cava is normal in size with greater than 50% respiratory variability, suggesting right atrial pressure of 3 mmHg. Conclusion(s)/Recommendation(s): No vegetation seen. If high clinical suspicion for endocarditis, consider TEE. FINDINGS  Left Ventricle: Left ventricular ejection fraction, by estimation, is 65 to 70%. The left ventricle has normal function. The left ventricle has no regional wall motion abnormalities. Definity contrast agent was given IV to delineate the left ventricular  endocardial borders. The left ventricular internal cavity size was normal in size. There is mild left ventricular hypertrophy. Left  ventricular diastolic parameters were normal. Right Ventricle: The right ventricular size is normal. No increase in right ventricular wall thickness. Right ventricular systolic function is normal. Tricuspid regurgitation signal is inadequate for assessing PA pressure. Left Atrium: Left atrial size was normal in size. Right Atrium: Right atrial size was normal in size. Pericardium: There is no evidence of pericardial effusion. Mitral Valve: The mitral valve is normal in structure. No evidence of mitral valve regurgitation. Tricuspid Valve: The tricuspid valve is normal in structure. Tricuspid valve regurgitation is trivial. Aortic Valve: The aortic valve was not well visualized. Aortic valve regurgitation is not visualized. No aortic stenosis is present. Pulmonic Valve: The pulmonic valve was not well visualized. Pulmonic valve regurgitation is not visualized. Aorta: The aortic root and ascending aorta are structurally normal, with no evidence of dilitation. Venous: The inferior vena cava is normal in size with greater than 50% respiratory variability, suggesting right atrial pressure of 3 mmHg. IAS/Shunts: The interatrial septum was not well visualized.  LEFT VENTRICLE PLAX 2D LVIDd:         4.80 cm  Diastology LVIDs:  2.80 cm  LV e' medial:    8.58 cm/s LV PW:         1.30 cm  LV E/e' medial:  8.6 LV IVS:        1.20 cm  LV e' lateral:   10.40 cm/s LVOT diam:     2.30 cm  LV E/e' lateral: 7.1 LV SV:         73 LV SV Index:   32 LVOT Area:     4.15 cm  RIGHT VENTRICLE RV S prime:     14.50 cm/s TAPSE (M-mode): 2.3 cm LEFT ATRIUM             Index       RIGHT ATRIUM           Index LA diam:        4.00 cm 1.74 cm/m  RA Area:     15.30 cm LA Vol (A2C):   43.1 ml 18.78 ml/m RA Volume:   31.10 ml  13.55 ml/m LA Vol (A4C):   38.6 ml 16.82 ml/m LA Biplane Vol: 42.9 ml 18.69 ml/m  AORTIC VALVE LVOT Vmax:   130.50 cm/s LVOT Vmean:  86.650 cm/s LVOT VTI:    0.175 m  AORTA Ao Root diam: 3.40 cm Ao Asc diam:   3.70 cm MITRAL VALVE MV Area (PHT): 4.44 cm     SHUNTS MV Decel Time: 171 msec     Systemic VTI:  0.18 m MV E velocity: 73.50 cm/s   Systemic Diam: 2.30 cm MV A velocity: 125.00 cm/s MV E/A ratio:  0.59 Oswaldo Milian MD Electronically signed by Oswaldo Milian MD Signature Date/Time: 01/21/2021/3:27:40 PM    Final    [place] FNA SOFT TISSUE  Result Date: 01/19/2021 CLINICAL DATA:  Previous instrumented spine surgery. Fever. MR suggests ventral epidural abscess as well as posterior paraspinal subcutaneous fluid collection adjacent to the posterior spinal hardware. Aspiration requested EXAM: ULTRASOUND GUIDED ASPIRATION BIOPSY OF POSTERIOR LUMBAR SUBCUTANEOUS FLUID MEDICATIONS: lidocaine 1% subcutaneous PROCEDURE: The procedure, risks, benefits, and alternatives were explained to the patient. Questions regarding the procedure were encouraged and answered. The patient understands and consents to the procedure. the dorsal subcutaneous fluid collection was localized. An appropriate skin entry site was determined and marked. The operative field was prepped with chlorhexidine in a sterile fashion, and a sterile drape was applied covering the operative field. A sterile gown and sterile gloves were used for the procedure. Local anesthesia was provided with 1% Lidocaine. Under real-time ultrasound guidance, 18 gauge spinal needle advanced to the collection. Approximately 2 mL of 1 apparent old blood were aspirated, sent for the requested laboratory studies. The patient tolerated the procedure well. COMPLICATIONS: None. FINDINGS: Scant return from dorsal subcutaneous collection, sent for the requested laboratory studies. IMPRESSION: Technically successful aspiration of dorsal subcutaneous collection returning scant old blood, sent for requested laboratory studies. Electronically Signed   By: Lucrezia Europe M.D.   On: 01/19/2021 15:39   CT IMAGE GUIDED DRAINAGE BY PERCUTANEOUS CATHETER  Result Date:  01/28/2021 INDICATION: 70 year old male status post back surgery with left retroperitoneal collection. EXAM: CT IMAGE GUIDED DRAINAGE BY PERCUTANEOUS CATHETER COMPARISON:  CT chest abdomen pelvis, 01/26/2021 MEDICATIONS: The patient is currently admitted to the hospital and receiving intravenous antibiotics. The antibiotics were administered within an appropriate time frame prior to the initiation of the procedure. ANESTHESIA/SEDATION: Moderate (conscious) sedation was employed during this procedure. A total of Versed 2 mg and Fentanyl 100 mcg was administered intravenously. Moderate  Sedation Time: 23 minutes. The patient's level of consciousness and vital signs were monitored continuously by radiology nursing throughout the procedure under my direct supervision. CONTRAST:  None COMPLICATIONS: None immediate. PROCEDURE: Informed written consent was obtained from the patient after a discussion of the risks, benefits and alternatives to treatment. The patient was placed supine on the CT gantry and a pre procedural CT was performed re-demonstrating the known abscess/fluid collection within the left retroperitoneum. The procedure was planned. A timeout was performed prior to the initiation of the procedure. The left lower quadrant and flank was prepped and draped in the usual sterile fashion. The overlying soft tissues were anesthetized with 1% lidocaine with epinephrine. Appropriate trajectory was planned with the use of a 22 gauge spinal needle. An 18 gauge trocar needle was advanced into the abscess/fluid collection and a short Amplatz super stiff wire was coiled within the collection. Appropriate positioning was confirmed with a limited CT scan. The tract was serially dilated allowing placement of a 12 [place] all-purpose drainage catheter. Appropriate positioning was confirmed with a limited postprocedural CT scan. Forty ml of urine-colored fluid was aspirated. The tube was connected to a drainage bag and sutured in  place. A dressing was placed. The patient tolerated the procedure well without immediate post procedural complication. IMPRESSION: Successful CT guided placement of a 70 French all purpose drain catheter into the left retroperitoneum with aspiration of 40 mL of urine-colored fluid. Samples were sent to the laboratory as requested by the ordering clinical team. Michaelle Birks, MD [facility] Electronically Signed   By: Michaelle Birks MD   On: 01/28/2021 16:17   [place] EKG SITE RITE  Result Date: 01/15/2021 If Site Rite image not attached, placement could not be confirmed due to current cardiac rhythm.  US Abdomen Limited RUQ (LIVER/GB)  Result Date: 01/11/2021 CLINICAL DATA:  Elevated transaminase EXAM: ULTRASOUND ABDOMEN LIMITED RIGHT UPPER QUADRANT COMPARISON:  None. FINDINGS: Gallbladder: 10 mm gallbladder polyp, although without vascularity. No gallstones or gallbladder wall thickening. Negative sonographic Murphy's sign. Common bile duct: Diameter: 4 mm Liver: Hyperechoic hepatic parenchyma. No focal hepatic lesion is seen. Portal vein is patent on color Doppler imaging with normal direction of blood flow towards the liver. Other: None. IMPRESSION: Hyperechoic hepatic parenchyma, suggesting hepatic steatosis. 10 mm gallbladder polyp. Given size, outpatient surgical consultation is suggested for consideration of elective cholecystectomy. If a conservative approach is desired, this is amenable to annual follow-up ultrasound. Electronically Signed   By: Julian Hy M.D.   On: 01/11/2021 01:57     The results of significant diagnostics from this hospitalization (including imaging, microbiology, ancillary and laboratory) are listed below for reference.     Microbiology: Recent Results (from the past 240 hour(s))  Culture, blood (routine x 2)     Status: None   Collection Time: 01/26/21  7:52 AM   Specimen: BLOOD  Result Value Ref Range Status    Specimen Description BLOOD RIGHT ANTECUBITAL  Final   Special Requests   Final    BOTTLES DRAWN AEROBIC AND ANAEROBIC Blood Culture adequate volume   Culture   Final    NO GROWTH 5 DAYS Performed at [facility], [address] [place], [place] [address]    Report Status 01/31/2021 FINAL  Final  Culture, blood (routine x 2)     Status: None   Collection Time: 01/26/21  7:53 AM   Specimen: BLOOD  Result Value Ref Range Status   Specimen  Description BLOOD RIGHT ANTECUBITAL  Final   Special Requests   Final    BOTTLES DRAWN AEROBIC AND ANAEROBIC Blood Culture adequate volume   Culture   Final    NO GROWTH 5 DAYS Performed at [facility], [address], [place], [place] [address]    Report Status 01/31/2021 FINAL  Final  Aerobic/Anaerobic Culture w Gram Stain (surgical/deep wound)     Status: None   Collection Time: 01/27/21  5:25 PM   Specimen: Abscess  Result Value Ref Range Status   Specimen Description ABSCESS  Final   Special Requests Normal  Final   Gram Stain   Final    RARE WBC PRESENT,BOTH PMN AND MONONUCLEAR NO ORGANISMS SEEN    Culture   Final    No growth aerobically or anaerobically. Performed at [facility], [place] [address], [place], [place] [address]    Report Status 02/01/2021 FINAL  Final  Body fluid culture w Gram Stain     Status: None   Collection Time: 01/28/21 12:57 PM   Specimen: Body Fluid  Result Value Ref Range Status   Specimen Description FLUID  Final   Special Requests SYNOVIAL RIGHT KNEE  Final   Gram Stain   Final    ABUNDANT WBC PRESENT, PREDOMINANTLY PMN NO ORGANISMS SEEN    Culture   Final    NO GROWTH 3 DAYS Performed at [facility], [address] [place],  [address]    Report Status 02/01/2021 FINAL  Final     Labs: BNP (last 3 results) No results for input(s): BNP in the last 8760 hours. Basic Metabolic Panel: Recent Labs  Lab 01/28/21 0511 01/29/21 0331 01/30/21 0310 01/31/21 0500  02/01/21 0152  NA 130* 129* 132* 132* 132*  K 3.7 3.9 3.9 4.0 4.2  CL 86* 85* 91* 91* 92*  CO2 34* 33* 34* 35* 34*  GLUCOSE 195* 204* 134* 147* 140*  BUN 22 21 17 17 16   CREATININE 1.01 0.98 0.79 0.76 0.80  CALCIUM 7.6* 8.1* 7.7* 7.7* 7.9*   Liver Function Tests: Recent Labs  Lab 01/29/21 0331 01/30/21 0310 01/31/21 0500 02/01/21 0152  AST 129* 103* 79* 73*  ALT 62* 45* 36 34  ALKPHOS 127* 116 115 107  BILITOT 0.5 0.9 0.6 0.7  PROT 5.4* 5.1* 4.9* 5.0*  ALBUMIN 1.6* 1.5* 1.4* 1.5*   No results for input(s): LIPASE, AMYLASE in the last 168 hours. No results for input(s): AMMONIA in the last 168 hours. CBC: Recent Labs  Lab 01/28/21 0511 01/29/21 0331 01/30/21 0310 01/31/21 0500 02/01/21 0152  WBC 17.5* 17.3* 16.3* 13.8* 12.9*  NEUTROABS  --  14.9* 14.0* 11.7* 10.7*  HGB 8.9* 10.1* 8.8* 8.7* 9.0*  HCT 28.7* 31.8* 28.4* 27.5* 28.4*  MCV 88.3 88.3 89.0 89.0 87.7  PLT 362 415* 428* 463* 499*   Cardiac Enzymes: No results for input(s): CKTOTAL, CKMB, CKMBINDEX, TROPONINI in the last 168 hours. BNP: Invalid input(s): POCBNP CBG: No results for input(s): GLUCAP in the last 168 hours. D-Dimer No results for input(s): DDIMER in the last 72 hours. Hgb A1c No results for input(s): HGBA1C in the last 72 hours. Lipid Profile No results for input(s): CHOL, HDL, LDLCALC, TRIG, CHOLHDL, LDLDIRECT in the last 72 hours. Thyroid function studies No results for input(s): TSH, T4TOTAL, T3FREE, THYROIDAB in the last 72 hours.  Invalid input(s): FREET3 Anemia work up No results for input(s): VITAMINB12, FOLATE, FERRITIN, TIBC, IRON, RETICCTPCT in the last 35  hours. Urinalysis    Component Value Date/Time   COLORURINE YELLOW 01/21/2021 0740   APPEARANCEUR CLOUDY (A) 01/21/2021 0740   LABSPEC 1.024 01/21/2021 0740   PHURINE 5.0 01/21/2021 0740   GLUCOSEU NEGATIVE 01/21/2021 0740   HGBUR SMALL (A) 01/21/2021 0740   BILIRUBINUR NEGATIVE 01/21/2021 0740   KETONESUR NEGATIVE  01/21/2021 0740   PROTEINUR 30 (A) 01/21/2021 0740   UROBILINOGEN 0.2 11/13/2007 1336   NITRITE NEGATIVE 01/21/2021 0740   LEUKOCYTESUR NEGATIVE 01/21/2021 0740   Sepsis Labs Invalid input(s): PROCALCITONIN,  WBC,  LACTICIDVEN Microbiology Recent Results (from the past 240 hour(s))  Culture, blood (routine x 2)     Status: None   Collection Time: 01/26/21  7:52 AM   Specimen: BLOOD  Result Value Ref Range Status   Specimen Description BLOOD RIGHT ANTECUBITAL  Final   Special Requests   Final    BOTTLES DRAWN AEROBIC AND ANAEROBIC Blood Culture adequate volume   Culture   Final    NO GROWTH 5 DAYS Performed at [facility], [address] [place], [place] [address]    Report Status 01/31/2021 FINAL  Final  Culture, blood (routine x 2)     Status: None   Collection Time: 01/26/21  7:53 AM   Specimen: BLOOD  Result Value Ref Range Status   Specimen Description BLOOD RIGHT ANTECUBITAL  Final   Special Requests   Final    BOTTLES DRAWN AEROBIC AND ANAEROBIC Blood Culture adequate volume   Culture   Final    NO GROWTH 5 DAYS Performed at [facility], [place] [address], [place], [place] [address]    Report Status 01/31/2021 FINAL  Final  Aerobic/Anaerobic Culture w Gram Stain (surgical/deep wound)     Status: None   Collection Time: 01/27/21  5:25 PM   Specimen: Abscess  Result Value Ref Range Status   Specimen Description ABSCESS  Final   Special Requests Normal  Final   Gram Stain   Final    RARE WBC PRESENT,BOTH PMN AND MONONUCLEAR NO ORGANISMS SEEN    Culture   Final    No growth aerobically or anaerobically. Performed at [facility], [place] [address], [place], [place] [address]    Report Status 02/01/2021 FINAL  Final  Body fluid culture w Gram Stain     Status: None   Collection Time: 01/28/21 12:57 PM   Specimen: Body Fluid  Result Value Ref Range Status   Specimen Description FLUID  Final   Special Requests SYNOVIAL RIGHT KNEE  Final   Gram  Stain   Final    ABUNDANT WBC PRESENT, PREDOMINANTLY PMN NO ORGANISMS SEEN    Culture   Final    NO GROWTH 3 DAYS Performed at [facility], [address], [place], [place] [address]    Report Status 02/01/2021 FINAL  Final     Time coordinating discharge in minutes: 65  SIGNED:   Debbe Odea, MD  [facility] 02/03/2021, 1:19 PM

## 2021-02-03 NOTE — Plan of Care (Signed)

## 2021-02-03 NOTE — Progress Notes (Signed)
Inpatient Rehabilitation Medication Review by a Pharmacist  A complete drug regimen review was completed for this patient to identify any potential clinically significant medication issues.  Clinically significant medication issues were identified:  yes   Type of Medication Issue Identified Description of Issue Urgent (address now) Non-Urgent (address on AM team rounds) Plan Plan Accepted by Provider? (Yes / No / Pending AM Rounds)  Drug Interaction(s) (clinically significant)       Duplicate Therapy       Allergy       No Medication Administration End Date  Per ID note, plans for IV Ancf to continue through 03/11/21.    Will add stop date. Rx to follow and place OPAT orders if pt d/c from CIR prior to completion of therapy.   Incorrect Dose       Additional Drug Therapy Needed       Other         Name of provider notified for urgent issues identified: n/a  Provider Method of Notification: n/a   For non-urgent medication issues to be resolved on team rounds tomorrow morning a [facility] Secure Chat Handoff was sent to: n/a   Pharmacist comments: Add Ancef stop date.  Follow-up for OPAT orders if needed.  No other medication issues identified.  Time spent performing this drug regimen review (minutes):  10   Rut Betterton, Judie Bonus 02/03/2021 5:24 PM

## 2021-02-03 NOTE — Progress Notes (Signed)
INPATIENT REHABILITATION ADMISSION NOTE   Arrival Method: Bed     Mental Orientation: A&O x4   Assessment: done   Skin: done   IV'S: PICC on RUE    Pain: none   Tubes and Drains: closed system drain on left flank   Safety Measures: reviewed with pt and family   Vital Signs: done   Height and Weight: done   Rehab Orientation: done   Family: at bedside    Notes:  done  Marylu Lund, RN

## 2021-02-04 ENCOUNTER — Inpatient Hospital Stay: Payer: BC Managed Care – PPO | Admitting: Internal Medicine

## 2021-02-04 ENCOUNTER — Inpatient Hospital Stay (HOSPITAL_COMMUNITY): Payer: BC Managed Care – PPO

## 2021-02-04 LAB — COMPREHENSIVE METABOLIC PANEL
ALT: 27 U/L (ref 0–44)
AST: 49 U/L — ABNORMAL HIGH (ref 15–41)
Albumin: 1.7 g/dL — ABNORMAL LOW (ref 3.5–5.0)
Alkaline Phosphatase: 90 U/L (ref 38–126)
Anion gap: 8 (ref 5–15)
BUN: 14 mg/dL (ref 8–23)
CO2: 33 mmol/L — ABNORMAL HIGH (ref 22–32)
Calcium: 8.1 mg/dL — ABNORMAL LOW (ref 8.9–10.3)
Chloride: 91 mmol/L — ABNORMAL LOW (ref 98–111)
Creatinine, Ser: 0.71 mg/dL (ref 0.61–1.24)
GFR, Estimated: >60 mL/min (ref >60)
Glucose, Bld: 132 mg/dL — ABNORMAL HIGH (ref 70–99)
Potassium: 4.1 mmol/L (ref 3.5–5.1)
Sodium: 132 mmol/L — ABNORMAL LOW (ref 135–145)
Total Bilirubin: 0.4 mg/dL (ref 0.3–1.2)
Total Protein: 5.4 g/dL — ABNORMAL LOW (ref 6.5–8.1)

## 2021-02-04 LAB — CBC WITH DIFFERENTIAL/PLATELET
Abs Immature Granulocytes: 0.11 10*3/uL — ABNORMAL HIGH (ref 0.00–0.07)
Basophils Absolute: 0.1 10*3/uL (ref 0.0–0.1)
Basophils Relative: 1 %
Eosinophils Absolute: 0.1 10*3/uL (ref 0.0–0.5)
Eosinophils Relative: 1 %
HCT: 28.7 % — ABNORMAL LOW (ref 39.0–52.0)
Hemoglobin: 9 g/dL — ABNORMAL LOW (ref 13.0–17.0)
Immature Granulocytes: 1 %
Lymphocytes Relative: 7 %
Lymphs Abs: 0.8 10*3/uL (ref 0.7–4.0)
MCH: 27.9 pg (ref 26.0–34.0)
MCHC: 31.4 g/dL (ref 30.0–36.0)
MCV: 88.9 fL (ref 80.0–100.0)
Monocytes Absolute: 0.9 10*3/uL (ref 0.1–1.0)
Monocytes Relative: 8 %
Neutro Abs: 9.8 10*3/uL — ABNORMAL HIGH (ref 1.7–7.7)
Neutrophils Relative %: 82 %
Platelets: 489 10*3/uL — ABNORMAL HIGH (ref 150–400)
RBC: 3.23 MIL/uL — ABNORMAL LOW (ref 4.22–5.81)
RDW: 13.2 % (ref 11.5–15.5)
WBC: 11.7 10*3/uL — ABNORMAL HIGH (ref 4.0–10.5)
nRBC: 0 % (ref 0.0–0.2)

## 2021-02-04 IMAGING — DX DG ABDOMEN 1V
3 series · 3 of 3 positions shown · non-contrast
Comparison: [DATE].

CLINICAL DATA: Constipation.  Abdominal distension and pain.

EXAM:
ABDOMEN - 1 VIEW

[abdomen kub (1 of 3)]
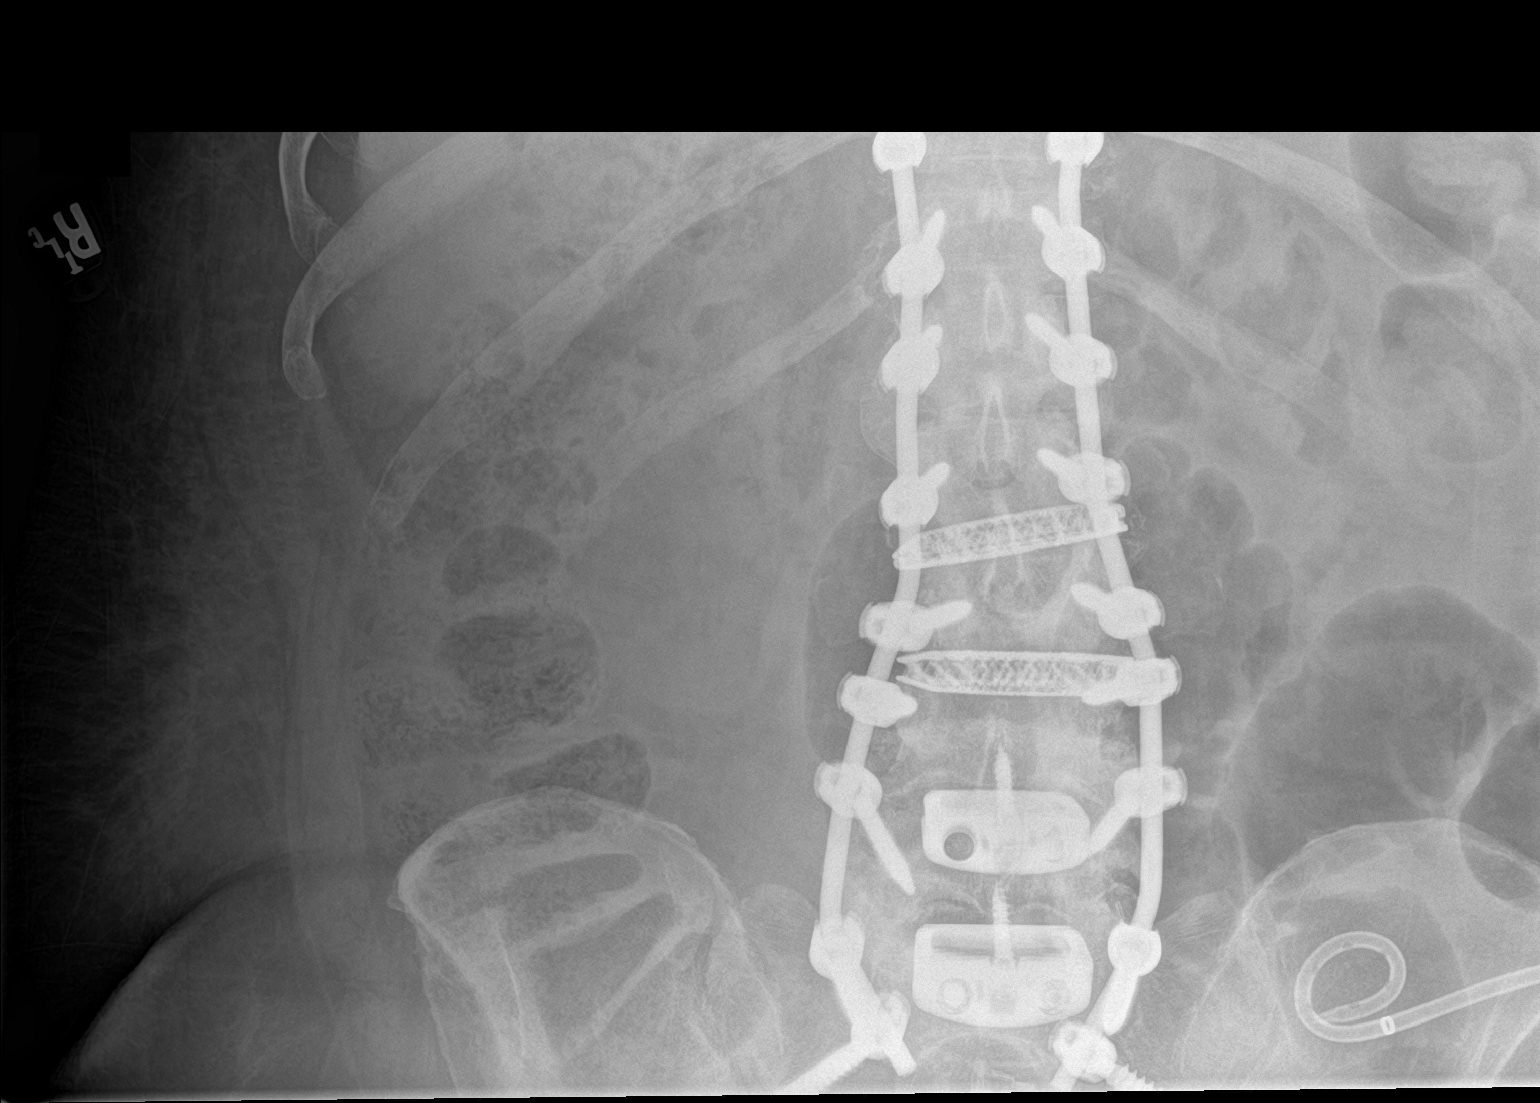

[abdomen kub (2 of 3)]
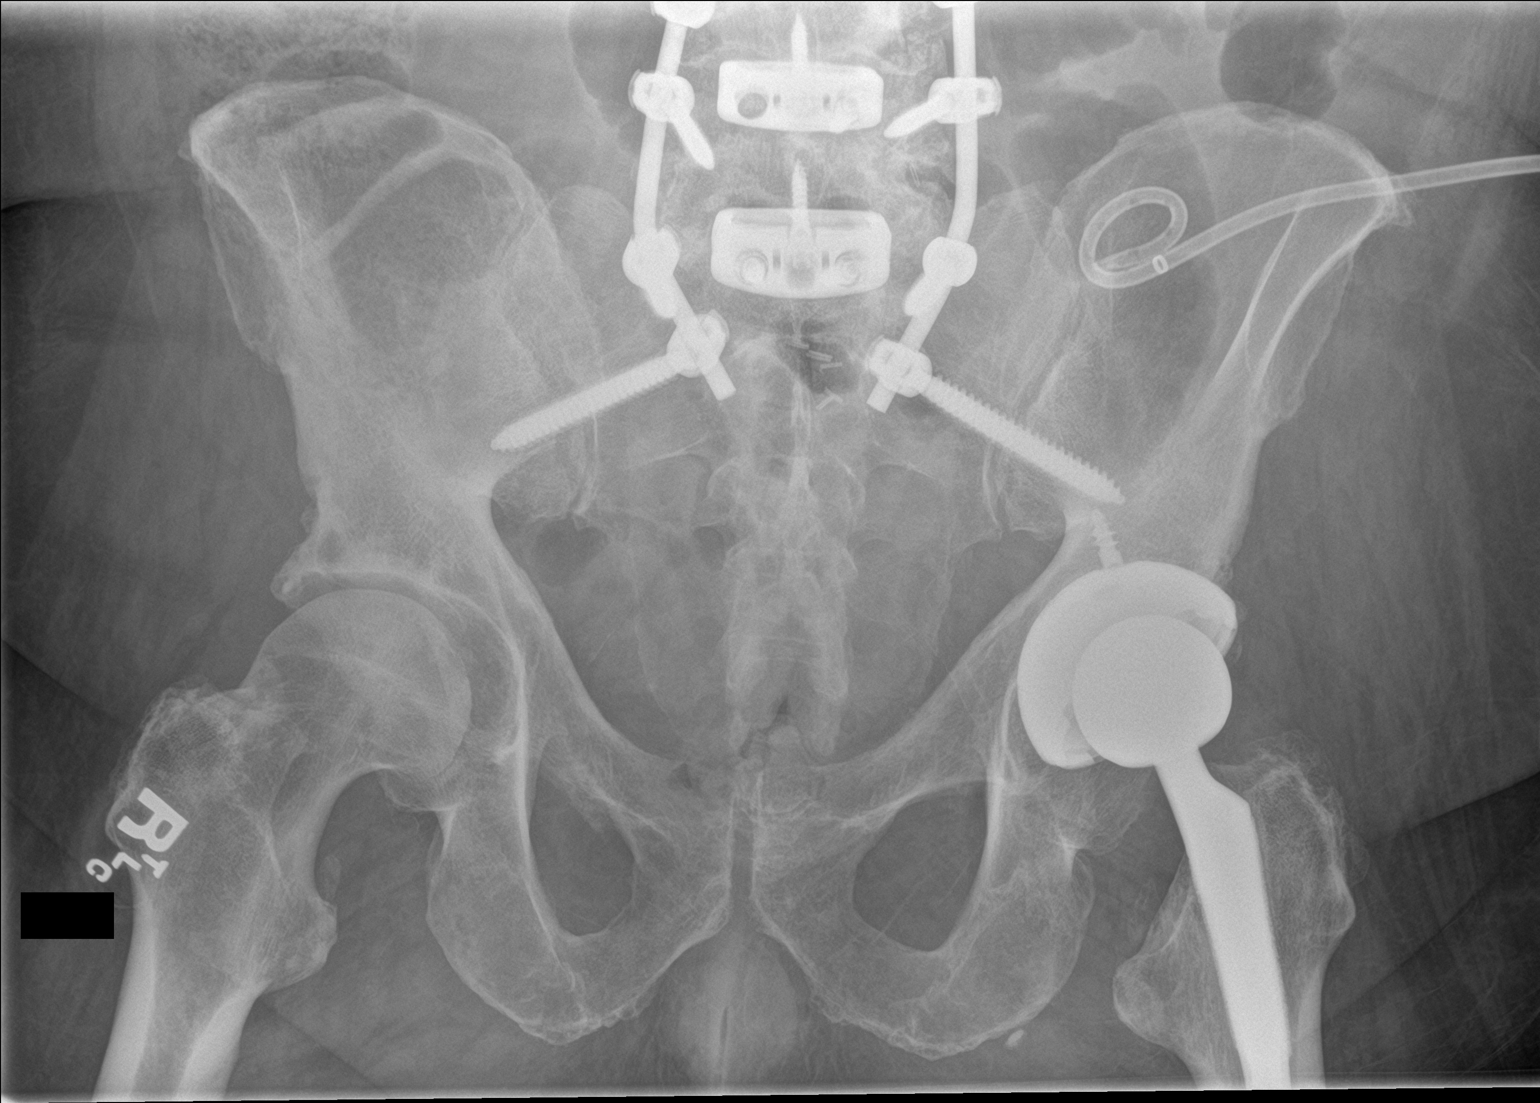

[abdomen kub (3 of 3)]
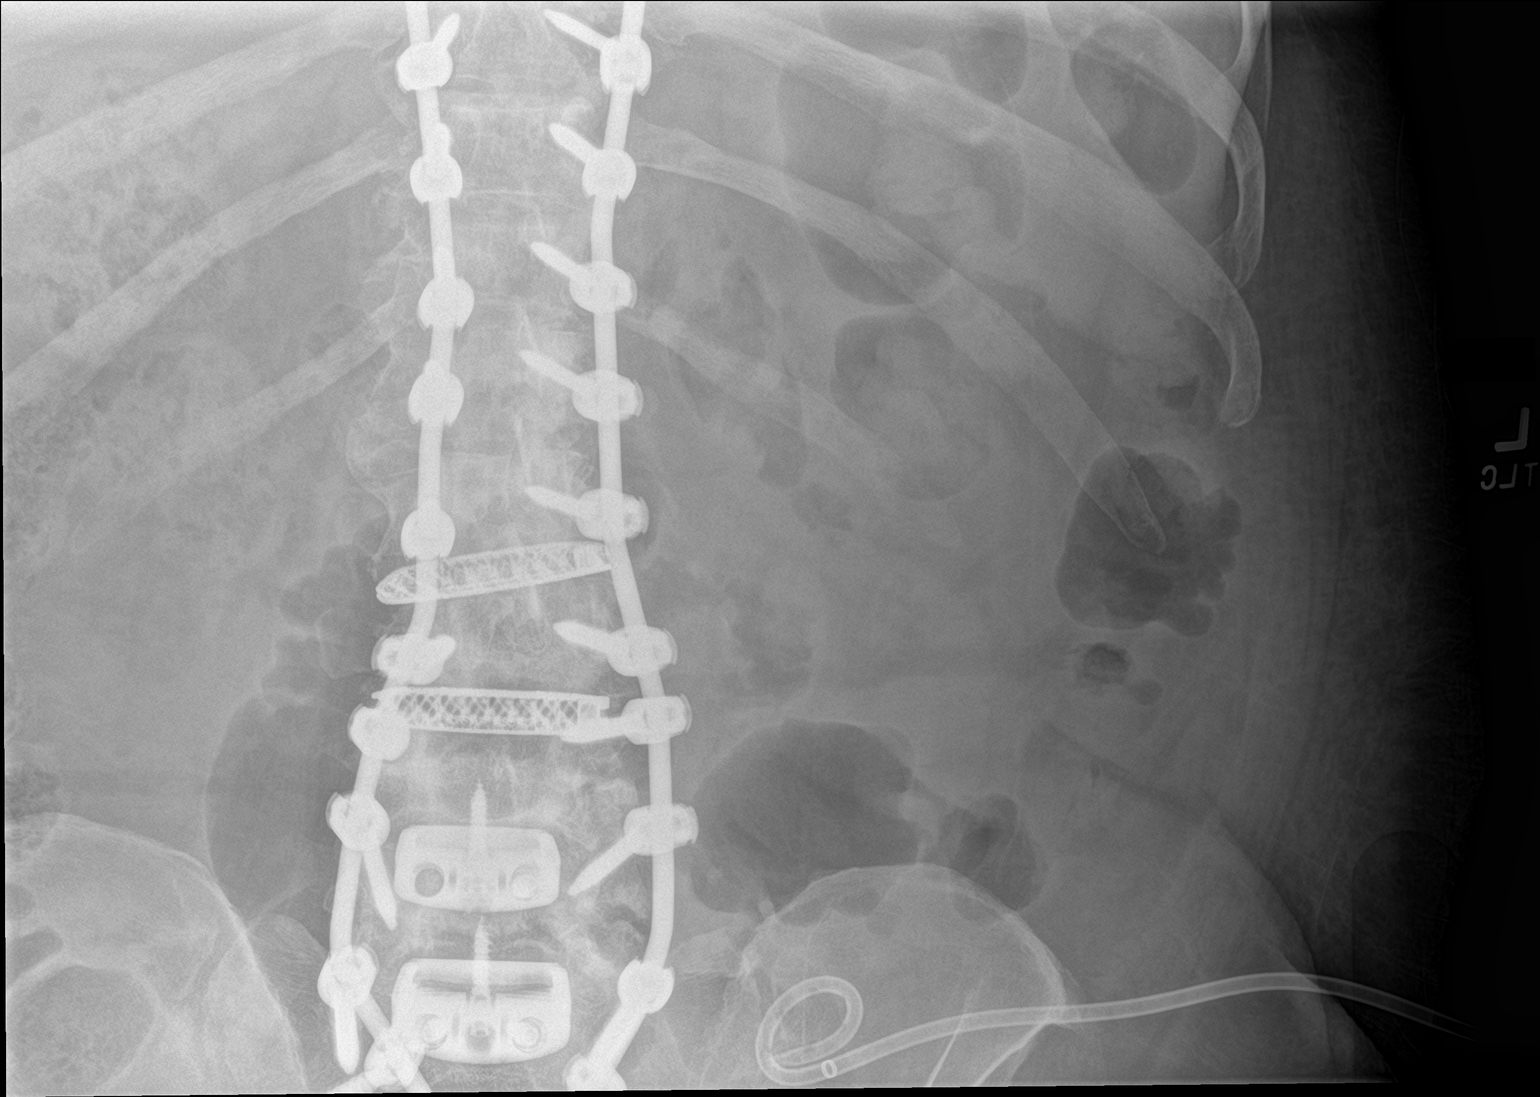

[3 of 3 positions shown; findings below may reference images not displayed]

FINDINGS: No abnormal bowel dilatation is noted. Mild to moderate amount of
stool is seen throughout the colon. Drainage catheter is noted in
left lower quadrant. Status post surgical posterior fusion of
visualized lower thoracic and lumbar spine.
IMPRESSION: Mild to moderate stool burden. No abnormal bowel dilatation is
noted.

## 2021-02-04 MED ORDER — SODIUM CHLORIDE 0.9% FLUSH
5.0000 mL | Freq: Three times a day (TID) | INTRAVENOUS | Status: DC
  Administered 2021-02-04 – 2021-02-16 (×36): 5 mL

## 2021-02-04 MED ORDER — SODIUM CHLORIDE 0.9% FLUSH: 5.0000 mL | Freq: Two times a day (BID) | INTRAVENOUS | Status: DC

## 2021-02-04 NOTE — Progress Notes (Signed)
PMR Admission Coordinator Pre-Admission Assessment  Patient: Marco Kennedy is an 70 y.o., male MRN: [id number] DOB: 10/19/1950 Height: 5' 7" (170.2 cm) Weight: 121.3 kg  Insurance Information HMO:     PPO: yes     PCP:      IPA:      80/20:      OTHER:  PRIMARY: [employer] [place]      Policy#: [id number]      Subscriber: pt CM Name: [name]      Phone#: [phone]     Fax#: [phone] Pre-Cert#: [id number] auth for CIR given by [name] with [employer] [place] with updates due to her on 8/15 at fax listed above      Employer:  Benefits:  Phone #: [phone]     Name:  Eff. Date: 06/28/20     Deduct: $500 (met $500)      Out of Pocket Max: $4000 (met $4000)      Life Max: n/a CIR: 80%      SNF: 80% Outpatient: 80%     Co Ins: 20% Home Health: 80%      Co-Ins: 20% DME: 80%     Co-Ins: 20% Providers:  SECONDARY: Medicare part A only       Policy#: [id number]     Phone#:   Financial Counselor:       Phone#:   The "Data Collection Information Summary" for patients in Inpatient Rehabilitation Facilities with attached "Privacy Act Statement-Health Care Records" was provided and verbally reviewed with: Patient  Emergency Contact Information Contact Information     Name Relation Home Work Mobile   Andrews,Kathy Spouse [phone]  [phone]   Heatley,AUSTIN Daughter   [phone]       Current Medical History  Patient Admitting Diagnosis: debility, discitis/osteomyelitis  History of Present Illness: Marco Kennedy is a 70-year-old right-handed male with history significant of obesity with BMI 41.88, back surgery with pelvis fixation in 03/2020, OSA on BiPAP and recent admission 01/10/2021 - 01/16/2021 for sepsis secondary to moderate-sized abscess near the pelvic hardware.  Neurosurgery, IR and ID were involved in his care at that time and underwent wound debridement with neurosurgery on 7/19 with E. coli from wound cultures.  He was given cefazolin with plan 6 weeks of IV treatment by  PICC line.  After patient was discharged to home noted bilateral rib pain needing to sleep in a lift chair.  Wife noted some increased swelling lower extremities and 10 pound weight gain.  Home health nurse 7/25 to assess the PICC line recommended follow-up PCP they decided to try telehealth by that time patient was noting some coughing and nonspecific chest discomfort as well as poor appetite.  He developed a low-grade fever and chills.  Admitted 01/20/2021 with chemistries unremarkable except sodium 134 glucose 162, sedimentation rate 70, procalcitonin 1.01, blood cultures currently no growth to date, gram stain no organisms seen, MRSA nondetected, hemoglobin 8.9, WBC 29,000.  Patient was hypoxic 88% on room air.  CT completed showing empyema and also discitis/osteomyelitis of T10.  Patient did undergo bilateral pigtail catheters placed in pleural cavities.  Echocardiogram with ejection fraction of 65 to 70% no wall motion abnormalities.  On 01/24/2021 right pleural catheter removed he did undergo IR placed left JP drain 01/27/2021.  Neurosurgery follow-up in regards to osteomyelitis no current plan for repeat surgical intervention.  Infectious disease follow-up patient currently remains on cefazolin until 03/11/2021 most recent blood cultures negative.  Hospital course complicated by left knee pseudogout right   knee was aspirated 44 cc of clear fluid and steroid injected most recent cultures negative.  Patient is maintained on Lovenox for DVT prophylaxis.  Leukocytosis has improved 12,900 and monitored.  His hemoglobin remained stable 9.0.  Bouts of hyponatremia 127-132 with urine osmolality 441.  Tolerating a regular consistency diet.  Therapy evaluations completed due to patient decreased functional mobility was recommended for a comprehensive rehab program.     Patient's medical record from [place] has been reviewed by the rehabilitation admission coordinator and physician.  Past Medical History  Past  Medical History:  Diagnosis Date   Arthritis    arthritis-bilateral knees, left Hip.   Degenerative lumbar spinal stenosis    EKG abnormality    06-08-16- being evaluated by cardiologist  in [place]   Idiopathic scoliosis of lumbar region    Joint stiffness    Knee pain    Leg swelling    Low back pain    Achy, shooting pain.  Hurts to Walk or stand up straight.   Lumbar radiculopathy    Scoliosis concern    Sleep apnea    Bipap use nightly 25/19 -3l/m oxygen   Spondylolisthesis of lumbar region    Transfusion history    [place] ,[place] after bleeding post colon polyp removal and colonscopy procedure    Family History   family history includes Arthritis in his mother; Hypertension in his brother and mother; Stomach cancer in his father.  Prior Rehab/Hospitalizations Has the patient had prior rehab or hospitalizations prior to admission? Yes  Has the patient had major surgery during 100 days prior to admission? Yes   Current Medications  Current Facility-Administered Medications:    0.9 %  sodium chloride infusion, , Intravenous, PRN, Rizwan, Saima, MD, Last Rate: 10 mL/hr at 02/03/21 0650, Infusion Verify at 02/03/21 0650   acetaminophen (TYLENOL) tablet 650 mg, 650 mg, Oral, Q6H PRN, 650 mg at 02/03/21 0016 **OR** acetaminophen (TYLENOL) suppository 650 mg, 650 mg, Rectal, Q6H PRN, Yates, Jennifer, MD   ceFAZolin (ANCEF) IVPB 2g/100 mL premix, 2 g, Intravenous, Q8H, Vu, Trung T, MD, Stopped at 02/03/21 0502   Chlorhexidine Gluconate Cloth 2 % PADS 6 each, 6 each, Topical, Daily, Feliz Ortiz, Abraham, MD, 6 each at 02/03/21 1032   colchicine tablet 0.6 mg, 0.6 mg, Oral, Daily, Samtani, Jai-Gurmukh, MD, 0.6 mg at 02/03/21 1029   enoxaparin (LOVENOX) injection 40 mg, 40 mg, Subcutaneous, Daily, Turpin, Pamela, PA-C, 40 mg at 02/03/21 1030   feeding supplement (ENSURE ENLIVE / ENSURE PLUS) liquid 237 mL, 237 mL, Oral, TID BM, Feliz Ortiz, Abraham, MD, 237 mL at 02/03/21 1032    morphine 4 MG/ML injection 4 mg, 4 mg, Intravenous, Q2H PRN, Smith, Daniel C, MD, 4 mg at 01/28/21 2054   multivitamin with minerals tablet 1 tablet, 1 tablet, Oral, Daily, Feliz Ortiz, Abraham, MD, 1 tablet at 02/03/21 1029   ondansetron (ZOFRAN) tablet 4 mg, 4 mg, Oral, Q6H PRN, 4 mg at 01/25/21 1845 **OR** ondansetron (ZOFRAN) injection 4 mg, 4 mg, Intravenous, Q6H PRN, Yates, Jennifer, MD, 4 mg at 01/28/21 0118   oxyCODONE (Oxy IR/ROXICODONE) immediate release tablet 5 mg, 5 mg, Oral, Q6H PRN, Yates, Jennifer, MD, 5 mg at 02/01/21 2128   polyethylene glycol (MIRALAX / GLYCOLAX) packet 17 g, 17 g, Oral, Daily PRN, Feliz Ortiz, Abraham, MD, 17 g at 01/30/21 1437   sodium chloride flush (NS) 0.9 % injection 10-40 mL, 10-40 mL, Intracatheter, Q12H, Feliz Ortiz, Abraham, MD, 10 mL at 02/03/21 1051     sodium chloride flush (NS) 0.9 % injection 10-40 mL, 10-40 mL, Intracatheter, PRN, Feliz Ortiz, Abraham, MD   sodium chloride flush (NS) 0.9 % injection 3 mL, 3 mL, Intravenous, Q12H, Yates, Jennifer, MD, 3 mL at 02/03/21 1052  Patients Current Diet:  Diet Order             Diet - low sodium heart healthy           Diet Heart Room service appropriate? Yes; Fluid consistency: Thin; Fluid restriction: 1200 mL Fluid  Diet effective now                   Precautions / Restrictions Precautions Precautions: Fall Precaution Comments: monitor SpO2; L drain Restrictions Weight Bearing Restrictions: No   Has the patient had 2 or more falls or a fall with injury in the past year? 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 Needed some help immediately prior to admission  Indoor Mobility: Did the patient need assistance with walking from room to room (with or without device)?  Independent  Stairs: Did the patient need assistance with internal or external stairs (with or without device)? Independent  Functional Cognition: Did the patient need help planning regular tasks such as shopping or remembering to take medications? Independent  Home Assistive Devices / Equipment Home Assistive Devices/Equipment: Cane (specify quad or straight), Wheelchair, Hand-held shower hose, Built-in shower seat, Eyeglasses Home Equipment: Shower seat, Bedside commode, Walker - 2 wheels, Cane - single point, Walker - 4 wheels, Other (comment), Toilet riser  Prior Device Use: Indicate devices/aids used by the patient prior to current illness, exacerbation or injury? Walker  Current Functional Level Cognition  Overall Cognitive Status: Within Functional Limits for tasks assessed Current Attention Level: Focused Orientation Level: Oriented X4 Following Commands: Follows one step commands inconsistently General Comments: WFL for basic tasks    Extremity Assessment (includes Sensation/Coordination)  Upper Extremity Assessment: Generalized weakness RUE Deficits / Details: edema, AROM, WFLs LUE Deficits / Details: edema, AROM, WFLs  Lower Extremity Assessment: Defer to PT evaluation RLE Deficits / Details: increased pain in R knee, warm to touch and "spongy" to touch RLE Coordination: decreased fine motor, decreased gross motor    ADLs  Overall ADL's : Needs assistance/impaired Eating/Feeding: Modified independent Grooming: Wash/dry hands, Wash/dry face, Oral care, Brushing hair, Set up, Sitting Grooming Details (indicate cue type and reason): unable to attend to task with eyes open; left with set-upA for spouse to assise Upper Body Bathing: Minimal assistance, Sitting, Cueing for safety, Cueing for sequencing Lower Body Bathing: Maximal assistance, Cueing for safety, Sitting/lateral leans, Sit to/from stand Upper Body Dressing : Minimal assistance, Sitting Lower Body Dressing:  Maximal assistance, Cueing for safety, +2 for physical assistance, +2 for safety/equipment, Sitting/lateral leans, Sit to/from stand Toilet Transfer: Minimal assistance, Stand-pivot, BSC, RW Toilet Transfer Details (indicate cue type and reason): sit to stand only Toileting- Clothing Manipulation and Hygiene: Maximal assistance, +2 for physical assistance, +2 for safety/equipment, Cueing for safety, Cueing for sequencing, Sit to/from stand Functional mobility during ADLs: Minimal assistance, Rolling walker General ADL Comments: Pt alert today, no pain meds prior to session. Pt with increased pain in R knee 9/10 pain. Pt limited by set-upA for light grooming and x1 sit to stand.    Mobility  Overal bed mobility:   Needs Assistance Bed Mobility: Sit to Sidelying Rolling: Mod assist Sidelying to sit: HOB elevated, Min assist Sit to sidelying: Mod assist, +2 for physical assistance General bed mobility comments: ModA for BLE management back into bed    Transfers  Overall transfer level: Needs assistance Equipment used: Rolling walker (2 wheeled) Transfers: Sit to/from Stand Sit to Stand: +2 physical assistance, Mod assist Stand pivot transfers: Min assist General transfer comment: ModA + 2 to rise to stand from recliner x 2. Cues for scooting to edge, hand placement, rocking to gain momentum, "nose over toes."    Ambulation / Gait / Stairs / Wheelchair Mobility  Ambulation/Gait Ambulation/Gait assistance: Min assist, +2 safety/equipment Gait Distance (Feet): 60 Feet (40", 20") Assistive device: Rolling walker (2 wheeled) Gait Pattern/deviations: Step-through pattern, Decreased stride length, Trunk flexed, Shuffle, Decreased stance time - right General Gait Details: Pt with short, shuffling gait pattern, increased trunk/hip flexion, decreased bilateral foot clearance. MinA for balance, cues for activity pacing, close chair follow. Ambulating 40 ft then an additional 20 ft with seated rest break  in between Gait velocity: reduced Gait velocity interpretation: <1.31 ft/sec, indicative of household ambulator    Posture / Balance Dynamic Sitting Balance Sitting balance - Comments: minguardA for safety Balance Overall balance assessment: Needs assistance Sitting-balance support: Feet supported, No upper extremity supported Sitting balance-Leahy Scale: Fair Sitting balance - Comments: minguardA for safety Standing balance support: Bilateral upper extremity supported, During functional activity Standing balance-Leahy Scale: Poor Standing balance comment: Reliant on UE support on RW and physical assistance.    Special needs/care consideration Oxygen up to 2L in hospital   Previous Home Environment (from acute therapy documentation) Living Arrangements: Spouse/significant other Available Help at Discharge: Family, Available 24 hours/day Type of Home: House Home Layout: Two level, Able to live on main level with bedroom/bathroom Alternate Level Stairs-Number of Steps: flight Home Access: Stairs to enter Entrance Stairs-Rails: Right, Can reach both Entrance Stairs-Number of Steps: approximately 14 total (6-7 steps followed by a walkway then 6-7 additional steps) Bathroom Shower/Tub: Tub/shower unit Bathroom Toilet: Handicapped height Home Care Services: No Additional Comments: 1 step up to enter main level if wife drives pt around to back door driving through neighbor's yard, which they do occasionally  Discharge Living Setting Plans for Discharge Living Setting: Patient's home, Lives with (comment) (spouse, Kathy) Type of Home at Discharge: House Discharge Home Layout: Able to live on main level with bedroom/bathroom Discharge Home Access: Level entry (through back entrance) Discharge Bathroom Shower/Tub: Tub/shower unit Discharge Bathroom Toilet: Handicapped height Discharge Bathroom Accessibility: Yes How Accessible: Accessible via walker Does the patient have any problems  obtaining your medications?: No  Social/Family/Support Systems Patient Roles: Spouse Anticipated Caregiver: Kathy Anderson (spouse) Anticipated Caregiver's Contact Information: [phone] Ability/Limitations of Caregiver: supervision to min assist Caregiver Availability: 24/7 Discharge Plan Discussed with Primary Caregiver: Yes Is Caregiver In Agreement with Plan?: Yes Does Caregiver/Family have Issues with Lodging/Transportation while Pt is in Rehab?: No  Goals Patient/Family Goal for Rehab: PT/OT supervision to mod I Expected length of stay: 12-16 days Pt/Family Agrees to Admission and willing to participate: Yes Program Orientation Provided & Reviewed with Pt/Caregiver Including Roles  & Responsibilities: Yes  Barriers to Discharge: Insurance for SNF coverage  Decrease burden of Care through IP rehab admission: n/a  Possible need for SNF placement upon discharge: not anticipated  Patient Condition: I have reviewed medical records from [facility], spoken with CM, and patient and spouse. I met with patient at the bedside   for inpatient rehabilitation assessment.  Patient will benefit from ongoing PT and OT, can actively participate in 3 hours of therapy a day 5 days of the week, and can make measurable gains during the admission.  Patient will also benefit from the coordinated team approach during an Inpatient Acute Rehabilitation admission.  The patient will receive intensive therapy as well as Rehabilitation physician, nursing, social worker, and care management interventions.  Due to safety, skin/wound care, disease management, medication administration, pain management, and patient education the patient requires 24 hour a day rehabilitation nursing.  The patient is currently min to mod assist with mobility and up to max assist with basic ADLs.  Discharge setting and therapy post discharge at home with home health is anticipated.  Patient has agreed to participate in the Acute Inpatient  Rehabilitation Program and will admit today.  Preadmission Screen Completed By:  Salem Lembke E Jesaiah Fabiano, PT, DPT 02/03/2021 12:56 PM ______________________________________________________________________   Discussed status with Dr. Lovorn on 02/03/21  at 1:26 PM  and received approval for admission today.  Admission Coordinator:  Nakya Weyand E Lofton Leon, PT, DPT, time 1:26 PM /Date 02/03/21    Assessment/Plan: Diagnosis: Does the need for close, 24 hr/day Medical supervision in concert with the patient's rehab needs make it unreasonable for this patient to be served in a less intensive setting? Yes Co-Morbidities requiring supervision/potential complications: osteomyelitis of T10; JP drain; need for IV ABX; LE B/L foot drop; s/p chest tubes Due to bladder management, bowel management, safety, skin/wound care, disease management, medication administration, pain management, and patient education, does the patient require 24 hr/day rehab nursing? Yes Does the patient require coordinated care of a physician, rehab nurse, PT, OT, and SLP to address physical and functional deficits in the context of the above medical diagnosis(es)? Yes Addressing deficits in the following areas: balance, endurance, locomotion, strength, transferring, bowel/bladder control, bathing, dressing, feeding, grooming, and toileting Can the patient actively participate in an intensive therapy program of at least 3 hrs of therapy 5 days a week? Yes The potential for patient to make measurable gains while on inpatient rehab is good Anticipated functional outcomes upon discharge from inpatient rehab: modified independent and supervision PT, modified independent and supervision OT, n/a SLP Estimated rehab length of stay to reach the above functional goals is: 12-16 days Anticipated discharge destination: Home 10. Overall Rehab/Functional Prognosis: good   MD Signature:  

## 2021-02-04 NOTE — Progress Notes (Signed)
Inpatient Rehabilitation Center Individual Statement of Services  Patient Name:  Marco Kennedy  Date:  02/04/2021  Welcome to the Inpatient Rehabilitation Center.  Our goal is to provide you with an individualized program based on your diagnosis and situation, designed to meet your specific needs.  With this comprehensive rehabilitation program, you will be expected to participate in at least 3 hours of rehabilitation therapies Monday-Friday, with modified therapy programming on the weekends.  Your rehabilitation program will include the following services:  Physical Therapy (PT), Occupational Therapy (OT), 24 hour per day rehabilitation nursing, Care Coordinator, Rehabilitation Medicine, Nutrition Services, and Pharmacy Services  Weekly team conferences will be held on Tuesday to discuss your progress.  Your Inpatient Rehabilitation Care Coordinator will talk with you frequently to get your input and to update you on team discussions.  Team conferences with you and your family in attendance may also be held.  Expected length of stay: 10-14 days  Overall anticipated outcome: supervision-min assist level  Depending on your progress and recovery, your program may change. Your Inpatient Rehabilitation Care Coordinator will coordinate services and will keep you informed of any changes. Your Inpatient Rehabilitation Care Coordinator's name and contact numbers are listed  below.  The following services may also be recommended but are not provided by the Inpatient Rehabilitation Center:  Driving Evaluations Home Health Rehabiltiation Services Outpatient Rehabilitation Services Vocational Rehabilitation   Arrangements will be made to provide these services after discharge if needed.  Arrangements include referral to agencies that provide these services.  Your insurance has been verified to be:  BCBS & Medicare part A Your primary doctor is:  John Hungarland  Pertinent information will be shared  with your doctor and your insurance company.  Inpatient Rehabilitation Care Coordinator:  Dossie Der, [place] [phone] or Luna Glasgow  Information discussed with and copy given to patient by: Lucy Chris, 02/04/2021, 11:18 AM

## 2021-02-04 NOTE — Progress Notes (Signed)
Occupational Therapy Session Note  Patient Details  Name: Marco Kennedy MRN: [id number] Date of Birth: 1950/12/15  Today's Date: 02/04/2021 OT Individual Time: 1448-1856 OT Individual Time Calculation (min): 42 min    Short Term Goals: Week 1:  OT Short Term Goal 1 (Week 1): Pt will complete BSC/toilet transfers CGA with LRAD OT Short Term Goal 2 (Week 1): Pt will complete ADL/activity of choice for 10 mins with minimal fatigue OT Short Term Goal 3 (Week 1): Pt will complete LB dress with AE PRN no more than MOD A  Skilled Therapeutic Interventions/Progress Updates:  Pt greeted at time of session sitting up in wheelchair no pain, fatigued but agreeable to OT session. At beginning of session provided clip/tape to attach drain to gown for ease of movement and mobility. Agreeable to practice toilet transfers, wheelchair transport to bathroom and stand pivot Min A wheelchair <> toilet with BSC over top for additional height to simulate home. Max A for clothing management for pants, unable to have BM at this time only gas. Stand pivot back to wheelchair same as above, wheelchair transport to room and stand pivot wheelchair > bed Min A with RW. Sit > supine Mod A for BLEs. Set up alarm on call bell in reach.   Therapy Documentation Precautions:  Precautions Precautions: Fall Precaution Comments: monitor SpO2; L drain Restrictions Weight Bearing Restrictions: No     Therapy/Group: Individual Therapy  Erasmo Score 02/04/2021, 12:32 PM

## 2021-02-04 NOTE — Progress Notes (Signed)
RT note. Patient has cpap from home all set up and ready for use & 2 L bled in line. Patient currently refusing cpap at this time and states he can place himself on later if he decides he want to go on cpap. RT will continue to monitor

## 2021-02-04 NOTE — Progress Notes (Signed)
Discussed drain care with IR PA--they will decrease flushes to once a day but would like a strict record of output documented in chart. They will follow at a distance with plans for imaging around 08/20--but to call them earlier if  drainage becomes minimal.

## 2021-02-04 NOTE — Discharge Instructions (Addendum)
Inpatient Rehab Discharge Instructions  Marco Kennedy Discharge date and time: No discharge date for patient encounter.   Activities/Precautions/ Functional Status: Activity: As tolerated Diet: Regular Wound Care: Routine skin checks Functional status:  ___ No restrictions     ___ Walk up steps independently ___ 24/7 supervision/assistance   ___ Walk up steps with assistance ___ Intermittent supervision/assistance  ___ Bathe/dress independently ___ Walk with walker     __x_ Bathe/dress with assistance ___ Walk Independently    ___ Shower independently ___ Walk with assistance    ___ Shower with assistance ___ No alcohol     ___ Return to work/school ________  Special Instructions: No driving smoking or alcohol  Continue cefazolin 2 g every 8 hours through 03/11/2021 and stop  Continue JP drain 1-2 more days follow-up interventional radiology  COMMUNITY REFERRALS UPON DISCHARGE:    Home Health:   PT & OT   (RN SERVICES-HELMS PHONE: [phone])                Agency:[facility] Phone:[phone]                              Medical Equipment/Items Ordered:WHEELCHAIR                                                 Agency/Supplier:[facility]  [phone]   My questions have been answered and I understand these instructions. I will adhere to these goals and the provided educational materials after my discharge from the hospital.  Patient/Caregiver Signature _______________________________ Date __________  Clinician Signature _______________________________________ Date __________  Please bring this form and your medication list with you to all your follow-up doctor's appointments.

## 2021-02-04 NOTE — Evaluation (Signed)
Occupational Therapy Assessment and Plan  Patient Details  Name: Marco Kennedy MRN: [id number] Date of Birth: 11-Aug-1950  OT Diagnosis: abnormal posture and muscle weakness (generalized) Rehab Potential:   ELOS: 10-14 days   Today's Date: 02/04/2021 OT Individual Time: 9326-7124 OT Individual Time Calculation (min): 59 min     Hospital Problem: Active Problems:   Vertebral osteomyelitis (HCC)   Obesity, Class III, BMI 40-49.9 (morbid obesity) ([place])   Sepsis due to Escherichia coli (E. coli) ([place])   Sepsis ([place])   Past Medical History:  Past Medical History:  Diagnosis Date   Arthritis    arthritis-bilateral knees, left Hip.   Degenerative lumbar spinal stenosis    EKG abnormality    06-08-16- being evaluated by cardiologist  in [place]   Idiopathic scoliosis of lumbar region    Joint stiffness    Knee pain    Leg swelling    Low back pain    Achy, shooting pain.  Hurts to Walk or stand up straight.   Lumbar radiculopathy    Scoliosis concern    Sleep apnea    Bipap use nightly 25/19 -3l/m oxygen   Spondylolisthesis of lumbar region    Transfusion history    [place] ,[place] after bleeding post colon polyp removal and colonscopy procedure   Past Surgical History:  Past Surgical History:  Procedure Laterality Date   ABDOMINAL EXPOSURE N/A 04/18/2020   Procedure: ABDOMINAL EXPOSURE;  Surgeon: Marty Heck, MD;  Location: [place];  Service: Vascular;  Laterality: N/A;   ANTERIOR LAT LUMBAR FUSION Left 04/18/2020   Procedure: ANTERIOR LATERAL LUMBAR FUSION LUMBAR TWO-THREE, LUMBAR THREE-FOUR;  Surgeon: Erline Levine, MD;  Location: [place];  Service: Neurosurgery;  Laterality: Left;   ANTERIOR LUMBAR FUSION N/A 04/18/2020   Procedure: Lumbar Four-Five Lumbar Five- Sacral One Anterior lumbar Interbody Fusion;  Surgeon: Erline Levine, MD;  Location: [place];  Service: Neurosurgery;  Laterality: N/A;   APPLICATION OF INTRAOPERATIVE CT SCAN N/A 04/21/2020   Procedure:  APPLICATION OF INTRAOPERATIVE CT SCAN;  Surgeon: Erline Levine, MD;  Location: [place];  Service: Neurosurgery;  Laterality: N/A;   BACK SURGERY     lumbar disc surgery   COLONOSCOPY W/ POLYPECTOMY     HERNIA REPAIR Left    left inguinal hernia repair   KNEE ARTHROSCOPY Right 2003    -scope for meniscus   LUMBAR WOUND DEBRIDEMENT N/A 01/13/2021   Procedure: THORACOLUMBAR WOUND DEBRIDEMENT;  Surgeon: Erline Levine, MD;  Location: [place];  Service: Neurosurgery;  Laterality: N/A;   POSTERIOR LUMBAR FUSION 4 LEVEL N/A 04/21/2020   Procedure: Thoracic ten to pelvis pedicle screw fixation with osteotomies;  Surgeon: Erline Levine, MD;  Location: [place];  Service: Neurosurgery;  Laterality: N/A;   TOTAL HIP ARTHROPLASTY Left 06/15/2016   Procedure: LEFT TOTAL HIP ARTHROPLASTY ANTERIOR APPROACH;  Surgeon: Paralee Cancel, MD;  Location: [facility];  Service: Orthopedics;  Laterality: Left;    Assessment & Plan Clinical Impression: Marco Kennedy is a 70 year old right-handed male with history significant of obesity with BMI 41.88, back surgery with pelvis fixation in 03/2020, OSA on BiPAP and recent admission 01/10/2021 - 01/16/2021 for sepsis secondary to moderate-sized abscess near the pelvic hardware.  Neurosurgery, IR and ID were involved in his care at that time and underwent wound debridement with neurosurgery on 7/19 with E. coli from wound cultures.  He was given cefazolin with plan 6 weeks of IV treatment by PICC line.  After patient was discharged to home  noted bilateral rib pain needing to sleep in a lift chair.  Wife noted some increased swelling lower extremities and 10 pound weight gain.  Home health nurse 7/25 to assess the PICC line recommended follow-up PCP they decided to try telehealth by that time patient was noting some coughing and nonspecific chest discomfort as well as poor appetite.  He developed a low-grade fever and chills.  Admitted 01/20/2021 with chemistries unremarkable except sodium 134  glucose 162, sedimentation rate 70, procalcitonin 1.01, blood cultures currently no growth to date, gram stain no organisms seen, MRSA nondetected, hemoglobin 8.9, WBC 29,000.  Patient was hypoxic 88% on room air.  CT completed showing empyema and also discitis/osteomyelitis of T10.  Patient did undergo bilateral pigtail catheters placed in pleural cavities.  Echocardiogram with ejection fraction of 65 to 70% no wall motion abnormalities.  On 01/24/2021 right pleural catheter removed he did undergo IR placed left JP drain 01/27/2021.  Neurosurgery follow-up in regards to osteomyelitis no current plan for repeat surgical intervention.  Infectious disease follow-up patient currently remains on cefazolin until 03/11/2021 most recent blood cultures negative.  Hospital course complicated by left knee pseudogout right knee was aspirated 44 cc of clear fluid and steroid injected most recent cultures negative.  Patient is maintained on Lovenox for DVT prophylaxis.  Leukocytosis has improved 12,900 and monitored.  His hemoglobin remained stable 9.0.  Bouts of hyponatremia 127-132 with urine osmolality 441.  Tolerating a regular consistency diet.  Therapy evaluations completed due to patient decreased functional mobility was admitted for a comprehensive rehab program. Patient transferred to CIR on 02/03/2021 .    Patient currently requires max with basic self-care skills secondary to muscle weakness, decreased cardiorespiratoy endurance, decreased motor planning, and decreased sitting balance, decreased standing balance, decreased postural control, and decreased balance strategies.  Prior to hospitalization, patient could complete BADL with min.  Patient will benefit from skilled intervention to decrease level of assist with basic self-care skills and increase independence with basic self-care skills prior to discharge home with care partner.  Anticipate patient will require 24 hour supervision and follow up home health.  OT  - End of Session Activity Tolerance: Tolerates 10 - 20 min activity with multiple rests Endurance Deficit: Yes OT Assessment OT Patient demonstrates impairments in the following area(s): Balance;Edema;Endurance;Motor;Pain;Safety OT Basic ADL's Functional Problem(s): Grooming;Bathing;Dressing;Toileting OT Transfers Functional Problem(s): Toilet;Tub/Shower OT Additional Impairment(s): None OT Plan OT Intensity: Minimum of 1-2 x/day, 45 to 90 minutes OT Frequency: 5 out of 7 days OT Duration/Estimated Length of Stay: 10-14 days OT Treatment/Interventions: Balance/vestibular training;Discharge planning;Pain management;Self Care/advanced ADL retraining;Therapeutic Activities;UE/LE Coordination activities;Visual/perceptual remediation/compensation;Therapeutic Exercise;Skin care/wound managment;Patient/family education;Functional mobility training;Disease mangement/prevention;Community reintegration;DME/adaptive equipment instruction;Neuromuscular re-education;Psychosocial support;UE/LE Strength taining/ROM;Wheelchair propulsion/positioning OT Self Feeding Anticipated Outcome(s): no goal OT Basic Self-Care Anticipated Outcome(s): Min LB, Supervision transfers OT Toileting Anticipated Outcome(s): Supervision OT Bathroom Transfers Anticipated Outcome(s): Supervision OT Recommendation Patient destination: Home Follow Up Recommendations: Home health OT Equipment Recommended: To be determined   OT Evaluation Precautions/Restrictions  Precautions Precautions: Fall Precaution Comments: monitor SpO2; L drain Restrictions Weight Bearing Restrictions: No Pain Pain Assessment Pain Scale: 0-10 Pain Score: 2  Pain Type: Acute pain Pain Location: Back Pain Orientation: Lower Pain Descriptors / Indicators: Aching Pain Frequency: Constant Pain Onset: With Activity Pain Intervention(s): Medication (See eMAR) Home Living/Prior Functioning Home Living Living Arrangements: Spouse/significant  other Available Help at Discharge: Available PRN/intermittently Type of Home: House Home Access: Stairs to enter Entrance Stairs-Number of Steps: approximately 14 total (6-7 steps followed by  a walkway then 6-7 additional steps) Entrance Stairs-Rails: Right, Can reach both Home Layout: Two level, Able to live on main level with bedroom/bathroom Alternate Level Stairs-Number of Steps: flight Alternate Level Stairs-Rails: Right Bathroom Shower/Tub: Tub/shower unit Bathroom Toilet: Handicapped height (w/ riser) Bathroom Accessibility: Yes Additional Comments: 1 step up to enter main level if wife drives pt around to back door driving through neighbor's yard, which they do occasionally (per chart)  Lives With: Spouse Prior Function Level of Independence: Requires assistive device for independence, Needs assistance with ADLs (wife assist with LB ADL)  Able to Take Stairs?: Yes Driving: Yes Vocation: Full time employment Vocation Requirements: Biochemist, clinical, 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 Vision Baseline Vision/History: Wears glasses Wears Glasses: At all times Patient Visual Report: No change from baseline Vision Assessment?: No apparent visual deficits Perception  Perception: Within Functional Limits Praxis Praxis: Intact Cognition Overall Cognitive Status: Within Functional Limits for tasks assessed Arousal/Alertness: Awake/alert Orientation Level: Person;Place;Situation Person: Oriented Place: Oriented Situation: Oriented Year: 2022 Month: August Day of Week: Correct Memory: Appears intact Immediate Memory Recall: Sock;Blue;Bed Memory Recall Sock: Without Cue Memory Recall Blue: Without Cue Memory Recall Bed: Without Cue Attention: Focused;Sustained Focused Attention: Appears intact Sustained Attention: Appears intact Awareness: Appears intact Problem Solving: Appears intact Safety/Judgment: Appears intact Comments: slightly fatigued  throughout but [facility] for ADL tasks Sensation Coordination Gross Motor Movements are Fluid and Coordinated: No Fine Motor Movements are Fluid and Coordinated: Yes Coordination and Movement Description: grossly uncoordinated d/t fatigue, abdominal distention, and global weakness Motor  Motor Motor: Other (comment) Motor - Skilled Clinical Observations: limited by global endurance deficit and weakness  Trunk/Postural Assessment  Cervical Assessment Cervical Assessment: Within Functional Limits Thoracic Assessment Thoracic Assessment: Exceptions to [facility] (rounded shoulders) Lumbar Assessment Lumbar Assessment: Exceptions to [facility] (posterior tilt) Postural Control Postural Control: Deficits on evaluation  Balance Balance Balance Assessed: Yes Static Sitting Balance Static Sitting - Balance Support: Feet supported;Bilateral upper extremity supported Static Sitting - Level of Assistance: 6: Modified independent (Device/Increase time) Dynamic Sitting Balance Dynamic Sitting - Balance Support: Feet supported;During functional activity Dynamic Sitting - Level of Assistance: 5: Stand by assistance Dynamic Sitting - Balance Activities: Lateral lean/weight shifting;Forward lean/weight shifting;Reaching for objects Static Standing Balance Static Standing - Balance Support: During functional activity;Bilateral upper extremity supported Static Standing - Level of Assistance: 4: Min assist Dynamic Standing Balance Dynamic Standing - Balance Support: During functional activity;Bilateral upper extremity supported Dynamic Standing - Level of Assistance: 3: Mod assist Dynamic Standing - Balance Activities: Lateral lean/weight shifting;Reaching for objects Dynamic Standing - Comments: dynamic standing during LB dress Extremity/Trunk Assessment RUE Assessment RUE Assessment: Within Functional Limits General Strength Comments: grossly 4/5, WFL for ADL tasks LUE Assessment LUE Assessment: Within  Functional Limits General Strength Comments: grossly 4/5, WFL for ADL tasks  Care Tool Care Tool Self Care Eating    Set up    Oral Care     Set up    Bathing   Body parts bathed by patient: Right arm;Left arm;Chest;Abdomen;Front perineal area;Right upper leg;Left upper leg;Face Body parts bathed by helper: Buttocks;Right lower leg;Left lower leg   Assist Level: Maximal Assistance - Patient 24 - 49%    Upper Body Dressing(including orthotics)   What is the patient wearing?: Hospital gown only   Assist Level: Minimal Assistance - Patient > 75%    Lower Body Dressing (excluding footwear)   What is the patient wearing?: Pants Assist for lower body dressing: Total Assistance -  Patient < 25%    Putting on/Taking off footwear   What is the patient wearing?: Non-skid slipper socks Assist for footwear: Dependent - Patient 0%       Care Tool Toileting Toileting activity    Total A     Care Tool Bed Mobility Roll left and right activity    Mod A    Sit to lying activity    Mod A    Lying to sitting edge of bed activity    Mod A     Care Tool Transfers Sit to stand transfer   Sit to stand assist level: Moderate Assistance - Patient 50 - 74%    Chair/bed transfer   Chair/bed transfer assist level: Minimal Assistance - Patient > 75%     Toilet transfer    Mod A     Care Tool Cognition Expression of Ideas and Wants Expression of Ideas and Wants: Some difficulty - exhibits some difficulty with expressing needs and ideas (e.g, some words or finishing thoughts) or speech is not clear   Understanding Verbal and Non-Verbal Content Understanding Verbal and Non-Verbal Content: Understands (complex and basic) - clear comprehension without cues or repetitions   Memory/Recall Ability *first 3 days only Memory/Recall Ability *first 3 days only: Current season;That he or she is in a hospital/hospital unit;Staff names and faces    Refer to Care Plan for Long Term Goals  SHORT  TERM GOAL WEEK 1 OT Short Term Goal 1 (Week 1): Pt will complete BSC/toilet transfers CGA with LRAD OT Short Term Goal 2 (Week 1): Pt will complete ADL/activity of choice for 10 mins with minimal fatigue OT Short Term Goal 3 (Week 1): Pt will complete LB dress with AE PRN no more than MOD A  Recommendations for other services: None    Skilled Therapeutic Intervention ADL ADL Eating: Not assessed Grooming: Supervision/safety Upper Body Bathing: Supervision/safety Where Assessed-Upper Body Bathing: Sitting at sink Lower Body Bathing: Maximal assistance Upper Body Dressing: Minimal assistance Lower Body Dressing: Dependent Toileting: Not assessed Toilet Transfer: Moderate assistance Toilet Transfer Method: Stand pivot Tub/Shower Transfer: Not assessed Mobility  Bed Mobility Bed Mobility: Rolling Right;Right Sidelying to Sit;Sitting - Scoot to Edge of Bed Rolling Right: Moderate Assistance - Patient 50-74% Right Sidelying to Sit: Moderate Assistance - Patient 50-74% Sitting - Scoot to Edge of Bed: Contact Guard/Touching assist Transfers Sit to Stand: Moderate Assistance - Patient 50-74% Stand to Sit: Moderate Assistance - Patient 50-74%   Skilled Intervention: Pt greeted at time of session semireclined in bed resting agreeable to OT session/eval. Discussion with pt at beginning of session regarding role and purpose of OT as well as OT goals. Pt aware and agreeable.  Bed mobility for supine > sit Mod A and scooting to EOB CGA. O2 sats 93% on room air and no reports of SOB throughout session. Sit > stand Mod A and stand pivot > recliner Min/Mod A overall with RW. Set up at sink and performed UB/LB bathing with Max A for LB for past knee level and buttocks. Pt able to wash periarea in sitting. UB dress for gown only as pt does have IV Min A and LB dress total A for assist to thread (wife performed at home) and sit <> stand at sink surface with therapist to don over hips. Set up in  recliner alarm on call bell in reach.    Discharge Criteria: Patient will be discharged from OT if patient refuses treatment 3 consecutive times without medical reason,  if treatment goals not met, if there is a change in medical status, if patient makes no progress towards goals or if patient is discharged from hospital.  The above assessment, treatment plan, treatment alternatives and goals were discussed and mutually agreed upon: by patient  Viona Gilmore 02/04/2021, 12:18 PM

## 2021-02-04 NOTE — Progress Notes (Signed)
Inpatient Rehabilitation Care Coordinator Assessment and Plan Patient Details  Name: Marco Kennedy MRN: [id number] Date of Birth: 28-Dec-1950  Today's Date: 02/04/2021  Hospital Problems: Active Problems:   Vertebral osteomyelitis (HCC)   Obesity, Class III, BMI 40-49.9 (morbid obesity) (HCC)   Sepsis due to Escherichia coli (E. coli) (HCC)   Sepsis (HCC)  Past Medical History:  Past Medical History:  Diagnosis Date   Arthritis    arthritis-bilateral knees, left Hip.   Degenerative lumbar spinal stenosis    EKG abnormality    06-08-16- being evaluated by cardiologist  in [place]   Idiopathic scoliosis of lumbar region    Joint stiffness    Knee pain    Leg swelling    Low back pain    Achy, shooting pain.  Hurts to Walk or stand up straight.   Lumbar radiculopathy    Scoliosis concern    Sleep apnea    Bipap use nightly 25/19 -3l/m oxygen   Spondylolisthesis of lumbar region    Transfusion history    [place] ,[place] after bleeding post colon polyp removal and colonscopy procedure   Past Surgical History:  Past Surgical History:  Procedure Laterality Date   ABDOMINAL EXPOSURE N/A 04/18/2020   Procedure: ABDOMINAL EXPOSURE;  Surgeon: [name], MD;  Location: [facility] OR;  Service: Vascular;  Laterality: N/A;   ANTERIOR LAT LUMBAR FUSION Left 04/18/2020   Procedure: ANTERIOR LATERAL LUMBAR FUSION LUMBAR TWO-THREE, LUMBAR THREE-FOUR;  Surgeon: [name], MD;  Location: [facility] OR;  Service: Neurosurgery;  Laterality: Left;   ANTERIOR LUMBAR FUSION N/A 04/18/2020   Procedure: Lumbar Four-Five Lumbar Five- Sacral One Anterior lumbar Interbody Fusion;  Surgeon: [name], MD;  Location: [facility] OR;  Service: Neurosurgery;  Laterality: N/A;   APPLICATION OF INTRAOPERATIVE CT SCAN N/A 04/21/2020   Procedure: APPLICATION OF INTRAOPERATIVE CT SCAN;  Surgeon: [name], MD;  Location: [facility] OR;  Service: Neurosurgery;  Laterality: N/A;   BACK SURGERY     lumbar disc surgery    COLONOSCOPY W/ POLYPECTOMY     HERNIA REPAIR Left    left inguinal hernia repair   KNEE ARTHROSCOPY Right 2003    -scope for meniscus   LUMBAR WOUND DEBRIDEMENT N/A 01/13/2021   Procedure: THORACOLUMBAR WOUND DEBRIDEMENT;  Surgeon: [name], MD;  Location: [facility] OR;  Service: Neurosurgery;  Laterality: N/A;   POSTERIOR LUMBAR FUSION 4 LEVEL N/A 04/21/2020   Procedure: Thoracic ten to pelvis pedicle screw fixation with osteotomies;  Surgeon: [name], MD;  Location: [facility] OR;  Service: Neurosurgery;  Laterality: N/A;   TOTAL HIP ARTHROPLASTY Left 06/15/2016   Procedure: LEFT TOTAL HIP ARTHROPLASTY ANTERIOR APPROACH;  Surgeon: [name], MD;  Location: [facility] ORS;  Service: Orthopedics;  Laterality: Left;   Social History:  reports that he has never smoked. He has never used smokeless tobacco. He reports current alcohol use. He reports that he does not use drugs.  Family / Support Systems Marital Status: Married Patient Roles: Spouse, Parent, Other (Comment) (employee) Spouse/Significant Other: Marco Kennedy [phone]  [phone] Children: Marco Kennedy-daughter [phone]-cell [place] Son in [place] Other Supports: Friends and church members Anticipated Caregiver: Marco Kennedy Ability/Limitations of Caregiver: Supervision-min but does work, may take off depending upon pt's needs Caregiver Availability: 24/7 (short term) Family Dynamics: Close knit with family and friends, along with colleagues. He was doing well until this infection and went downhill. He hopes to do well here  Social History Preferred language: English Religion: Baptist Cultural Background: No issues Education: Automotive engineer educated Read:  Yes Write: Yes Employment Status: Employed Name of Employer: [employer] Return to Work Plans: Will need to recover first before can return Legal History/Current Legal Issues: No issues Guardian/Conservator: None-according to MD pt is capable of making his own decisions while here    Abuse/Neglect Abuse/Neglect Assessment Can Be Completed: Yes Physical Abuse: Denies Verbal Abuse: Denies Sexual Abuse: Denies Exploitation of patient/patient's resources: Denies Self-Neglect: Denies  Emotional Status Pt's affect, behavior and adjustment status: Pt is motivated to do well and recover he was doing well at home prior to this infection. He had gone back to work part time. He is motivated to do well here and recover from this now. Recent Psychosocial Issues: other health issues-back surgery 2021 and now infection which brought him back to the hospital Psychiatric History: No history deferreed depression screening due to coping appropriatley but would benefit from seeing neuro-psych if can while here Substance Abuse History: No issues  Patient / Family Perceptions, Expectations & Goals Pt/Family understanding of illness & functional limitations: Pt and wife can explain his surgery and now infection. He and wife have spoken with the MD and feel their questions are addressed and concerns addressed. Pt is one who wants to be told all information and not to sugar coat it Premorbid pt/family roles/activities: Husband, father, employee, neighbor and church member Anticipated changes in roles/activities/participation: resume Pt/family expectations/goals: Pt states: " I hope to get mobile and not need much care when I leave here."  Wife states: " I will assist but cant be there 24/7 for long."  [employer]: Other (Comment) Premorbid Home Care/DME Agencies: Other (Comment) ([facility] for IV antibiotics at home PTA has wc, rw, bsc, shower seat) Transportation available at discharge: Wife, pt was driving prior to surgery Resource referrals recommended: Neuropsychology  Discharge Planning Living Arrangements: Spouse/significant other Support Systems: Spouse/significant other, Children, Friends/neighbors, Church/faith community Type of Residence: Private  residence Insurance Resources: Media planner (specify), Medicare Herbalist) Financial Resources: Family Support, Employment Financial Screen Referred: No Living Expenses: Own Money Management: Patient, Spouse Does the patient have any problems obtaining your medications?: No Home Management: Wife Patient/Family Preliminary Plans: Return home with wife who can take some time off to be with him, but this is short term. They have two children but both are out of state. Daughter is a PT. Will await therapy evaluations and work on doscharge needs. Care Coordinator Anticipated Follow Up Needs: HH/OP  Clinical Impression Pleasant gentleman who is motivated to do well here and regain his independence. His wife is involved and will take some time off work if needed to assist. [facility] was involved for IV antibiotics prior to admission. Will await therapy evaluations and work on discharge needs.  Lucy Chris 02/04/2021, 11:16 AM

## 2021-02-04 NOTE — Plan of Care (Signed)
  Problem: RH Balance Goal: LTG Patient will maintain dynamic standing with ADLs (OT) Description: LTG:  Patient will maintain dynamic standing balance with assist during activities of daily living (OT)  Flowsheets (Taken 02/04/2021 1231) LTG: Pt will maintain dynamic standing balance during ADLs with: Supervision/Verbal cueing   Problem: Sit to Stand Goal: LTG:  Patient will perform sit to stand in prep for activites of daily living with assistance level (OT) Description: LTG:  Patient will perform sit to stand in prep for activites of daily living with assistance level (OT) Flowsheets (Taken 02/04/2021 1231) LTG: PT will perform sit to stand in prep for activites of daily living with assistance level: Supervision/Verbal cueing   Problem: RH Grooming Goal: LTG Patient will perform grooming w/assist,cues/equip (OT) Description: LTG: Patient will perform grooming with assist, with/without cues using equipment (OT) Flowsheets (Taken 02/04/2021 1231) LTG: Pt will perform grooming with assistance level of: Set up assist    Problem: RH Bathing Goal: LTG Patient will bathe all body parts with assist levels (OT) Description: LTG: Patient will bathe all body parts with assist levels (OT) Flowsheets (Taken 02/04/2021 1231) LTG: Pt will perform bathing with assistance level/cueing: Supervision/Verbal cueing   Problem: RH Dressing Goal: LTG Patient will perform upper body dressing (OT) Description: LTG Patient will perform upper body dressing with assist, with/without cues (OT). Flowsheets (Taken 02/04/2021 1231) LTG: Pt will perform upper body dressing with assistance level of: Set up assist Goal: LTG Patient will perform lower body dressing w/assist (OT) Description: LTG: Patient will perform lower body dressing with assist, with/without cues in positioning using equipment (OT) Flowsheets (Taken 02/04/2021 1231) LTG: Pt will perform lower body dressing with assistance level of: Minimal Assistance -  Patient > 75%   Problem: RH Toileting Goal: LTG Patient will perform toileting task (3/3 steps) with assistance level (OT) Description: LTG: Patient will perform toileting task (3/3 steps) with assistance level (OT)  Flowsheets (Taken 02/04/2021 1231) LTG: Pt will perform toileting task (3/3 steps) with assistance level: Supervision/Verbal cueing   Problem: RH Toilet Transfers Goal: LTG Patient will perform toilet transfers w/assist (OT) Description: LTG: Patient will perform toilet transfers with assist, with/without cues using equipment (OT) Flowsheets (Taken 02/04/2021 1231) LTG: Pt will perform toilet transfers with assistance level of: Supervision/Verbal cueing

## 2021-02-04 NOTE — Progress Notes (Signed)
Inpatient Rehabilitation  Patient information reviewed and entered into eRehab system by Izzabelle Bouley Ronnell Clinger, OTR/L.   Information including medical coding, functional ability and quality indicators will be reviewed and updated through discharge.    

## 2021-02-04 NOTE — Evaluation (Signed)
Physical Therapy Assessment and Plan  Patient Details  Name: Marco Kennedy MRN: [id number] Date of Birth: [birth year]  PT Diagnosis: Abnormal posture, Abnormality of gait, Coordination disorder, Difficulty walking, Edema, and Low back pain Rehab Potential: Good ELOS: 10-14 days   Today's Date: 02/04/2021 PT Individual Time: 1030-1130, 8657-8469 PT Individual Time Calculation (min): 60 min, 60 min  Hospital Problem: Active Problems:   Vertebral osteomyelitis (HCC)   Obesity, Class III, BMI 40-49.9 (morbid obesity) ([place])   Sepsis due to Escherichia coli (E. coli) ([place])   Sepsis ([place])   Past Medical History:  Past Medical History:  Diagnosis Date   Arthritis    arthritis-bilateral knees, left Hip.   Degenerative lumbar spinal stenosis    EKG abnormality    06-08-16- being evaluated by cardiologist  in [place]   Idiopathic scoliosis of lumbar region    Joint stiffness    Knee pain    Leg swelling    Low back pain    Achy, shooting pain.  Hurts to Walk or stand up straight.   Lumbar radiculopathy    Scoliosis concern    Sleep apnea    Bipap use nightly 25/19 -3l/m oxygen   Spondylolisthesis of lumbar region    Transfusion history    [place] ,[place] after bleeding post colon polyp removal and colonscopy procedure   Past Surgical History:  Past Surgical History:  Procedure Laterality Date   ABDOMINAL EXPOSURE N/A 04/18/2020   Procedure: ABDOMINAL EXPOSURE;  Surgeon: Marty Heck, MD;  Location: [facility];  Service: Vascular;  Laterality: N/A;   ANTERIOR LAT LUMBAR FUSION Left 04/18/2020   Procedure: ANTERIOR LATERAL LUMBAR FUSION LUMBAR TWO-THREE, LUMBAR THREE-FOUR;  Surgeon: Erline Levine, MD;  Location: [facility];  Service: Neurosurgery;  Laterality: Left;   ANTERIOR LUMBAR FUSION N/A 04/18/2020   Procedure: Lumbar Four-Five Lumbar Five- Sacral One Anterior lumbar Interbody Fusion;  Surgeon: Erline Levine, MD;  Location: [facility];  Service: Neurosurgery;  Laterality: N/A;    APPLICATION OF INTRAOPERATIVE CT SCAN N/A 04/21/2020   Procedure: APPLICATION OF INTRAOPERATIVE CT SCAN;  Surgeon: Erline Levine, MD;  Location: [facility];  Service: Neurosurgery;  Laterality: N/A;   BACK SURGERY     lumbar disc surgery   COLONOSCOPY W/ POLYPECTOMY     HERNIA REPAIR Left    left inguinal hernia repair   KNEE ARTHROSCOPY Right 2003    -scope for meniscus   LUMBAR WOUND DEBRIDEMENT N/A 01/13/2021   Procedure: THORACOLUMBAR WOUND DEBRIDEMENT;  Surgeon: Erline Levine, MD;  Location: [facility];  Service: Neurosurgery;  Laterality: N/A;   POSTERIOR LUMBAR FUSION 4 LEVEL N/A 04/21/2020   Procedure: Thoracic ten to pelvis pedicle screw fixation with osteotomies;  Surgeon: Erline Levine, MD;  Location: [facility];  Service: Neurosurgery;  Laterality: N/A;   TOTAL HIP ARTHROPLASTY Left 06/15/2016   Procedure: LEFT TOTAL HIP ARTHROPLASTY ANTERIOR APPROACH;  Surgeon: Paralee Cancel, MD;  Location: [facility];  Service: Orthopedics;  Laterality: Left;    Assessment & Plan Clinical Impression:Marco Kennedy is a 70 year old right-handed male with history significant of obesity with BMI 41.88, back surgery with pelvis fixation in 03/2020, OSA on BiPAP and recent admission 01/10/2021 - 01/16/2021 for sepsis secondary to moderate-sized abscess near the pelvic hardware.  Neurosurgery, IR and ID were involved in his care at that time and underwent wound debridement with neurosurgery on 7/19 with E. coli from wound cultures.  He was given cefazolin with plan 6 weeks of IV treatment by PICC line.  After patient was discharged to home noted bilateral rib pain needing to sleep in a lift chair.  Wife noted some increased swelling lower extremities and 10 pound weight gain.  Home health nurse 7/25 to assess the PICC line recommended follow-up PCP they decided to try telehealth by that time patient was noting some coughing and nonspecific chest discomfort as well as poor appetite.  He developed a low-grade fever and  chills.  Admitted 01/20/2021 with chemistries unremarkable except sodium 134 glucose 162, sedimentation rate 70, procalcitonin 1.01, blood cultures currently no growth to date, gram stain no organisms seen, MRSA nondetected, hemoglobin 8.9, WBC 29,000.  Patient was hypoxic 88% on room air.  CT completed showing empyema and also discitis/osteomyelitis of T10.  Patient did undergo bilateral pigtail catheters placed in pleural cavities.  Echocardiogram with ejection fraction of 65 to 70% no wall motion abnormalities.  On 01/24/2021 right pleural catheter removed he did undergo IR placed left JP drain 01/27/2021.  Neurosurgery follow-up in regards to osteomyelitis no current plan for repeat surgical intervention.  Infectious disease follow-up patient currently remains on cefazolin until 03/11/2021 most recent blood cultures negative.  Hospital course complicated by left knee pseudogout right knee was aspirated 44 cc of clear fluid and steroid injected most recent cultures negative.  Patient is maintained on Lovenox for DVT prophylaxis.  Leukocytosis has improved 12,900 and monitored.  His hemoglobin remained stable 9.0.  Bouts of hyponatremia 127-132 with urine osmolality 441.  Tolerating a regular consistency diet.  Therapy evaluations completed due to patient decreased functional mobility was admitted for a comprehensive rehab program.Patient transferred to CIR on 02/03/2021 .   Patient currently requires min with mobility secondary to muscle weakness, decreased cardiorespiratoy endurance, and decreased sitting balance, decreased standing balance, and decreased balance strategies.  Prior to hospitalization, patient was modified independent  with mobility and lived with Spouse in a House home.  Home access is approximately 14 total (6-7 steps followed by a walkway then 6-7 additional steps)Stairs to enter.  Patient will benefit from skilled PT intervention to maximize safe functional mobility, minimize fall risk, and  decrease caregiver burden for planned discharge home with 24 hour supervision.  Anticipate patient will benefit from follow up [place] at discharge.  PT - End of Session Activity Tolerance: Tolerates 30+ min activity with multiple rests Endurance Deficit: Yes PT Assessment Rehab Potential (ACUTE/IP ONLY): Good PT Barriers to Discharge: [place] home environment;Decreased caregiver support;Home environment access/layout;Wound Care;Weight PT Patient demonstrates impairments in the following area(s): Endurance;Motor;Balance;Pain;Edema;Skin Integrity PT Transfers Functional Problem(s): Bed Mobility;Bed to Chair;Car;Furniture PT Locomotion Functional Problem(s): Ambulation;Wheelchair Mobility;Stairs PT Plan PT Intensity: Minimum of 1-2 x/day ,45 to 90 minutes PT Frequency: 5 out of 7 days PT Duration Estimated Length of Stay: 10-14 days PT Treatment/Interventions: Ambulation/gait training;Balance/vestibular training;Community reintegration;Discharge planning;Disease management/prevention;DME/adaptive equipment instruction;Functional mobility training;Neuromuscular re-education;Pain management;Patient/family education;Psychosocial support;Skin care/wound management;Stair training;Therapeutic Activities;Therapeutic Exercise;UE/LE Strength taining/ROM;UE/LE Coordination activities;Wheelchair propulsion/positioning PT Transfers Anticipated Outcome(s): Supervision PT Locomotion Anticipated Outcome(s): Supervision short distance gait PT Recommendation Follow Up Recommendations: Home health PT Patient destination: Home Equipment Recommended: To be determined Equipment Details: Likely w/c   PT Evaluation Precautions/Restrictions Precautions Precautions: Fall Precaution Comments: monitor SpO2; L drain Restrictions Weight Bearing Restrictions: No General   Vital SignsTherapy Vitals Temp: 98.5 F (36.9 C) Temp Source: Oral Pulse Rate: 77 Resp: 18 BP: (!) 143/77 Patient Position (if  appropriate): Lying Oxygen Therapy SpO2: 98 % O2 Device: Room Air Pain   Home Living/Prior Functioning Home Living Available Help at Discharge: Available  PRN/intermittently Type of Home: House Home Access: Stairs to enter [employer] of Steps: approximately 14 total (6-7 steps followed by a walkway then 6-7 additional steps) Entrance Stairs-Rails: Right;Can reach both Home Layout: Two level;Able to live on main level with bedroom/bathroom Alternate Level Stairs-Number of Steps: flight Alternate Level Stairs-Rails: Right  Lives With: Spouse Prior Function Level of Independence: Requires assistive device for independence;Needs assistance with ADLs  Able to Take Stairs?: Yes Driving: Yes Vocation: Full time employment Vocation Requirements: Biochemist, clinical, 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, engineering: Within [place]: Intact  Cognition Overall Cognitive Status: Within Functional Limits for tasks assessed Arousal/Alertness: Awake/alert Orientation Level: Oriented X4 Attention: Focused;Sustained Focused Attention: Appears intact Sustained Attention: Appears intact Memory: Appears intact Awareness: Appears intact Problem Solving: Appears intact Safety/Judgment: Appears intact Sensation Sensation Light Touch: Appears Intact Hot/Cold: Appears Intact Proprioception: Appears Intact Stereognosis: Appears Intact Coordination Gross Motor Movements are Fluid and Coordinated: No Coordination and Movement Description: grossly uncoordinated d/t fatigue, abdominal distention, and global weakness Motor  Motor Motor: Other (comment) Motor - Skilled Clinical Observations: limited by global endurance deficit and weakness Motor - Discharge Observations: `   Trunk/Postural Assessment  Cervical Assessment Cervical Assessment: Within Functional Limits Thoracic Assessment Thoracic Assessment: Within Functional Limits Lumbar  Assessment Lumbar Assessment: Within Functional Limits Postural Control Postural Control: Deficits on evaluation  Balance Balance Balance Assessed: Yes Static Sitting Balance Static Sitting - Balance Support: Feet supported;Bilateral upper extremity supported Static Sitting - Level of Assistance: 6: Modified independent (Device/Increase time) Dynamic Sitting Balance Dynamic Sitting - Balance Support: Feet supported;During functional activity Dynamic Sitting - Level of Assistance: 5: Stand by assistance Dynamic Sitting - Balance Activities: Lateral lean/weight shifting;Forward lean/weight shifting;Reaching for objects Sitting balance - Comments: [email or web address] for safety Static Standing Balance Static Standing - Balance Support: During functional activity;Bilateral upper extremity supported Static Standing - Level of Assistance: 4: Min assist Dynamic Standing Balance Dynamic Standing - Balance Support: During functional activity;Bilateral upper extremity supported Dynamic Standing - Level of Assistance: 3: Mod assist Extremity Assessment      RLE Assessment RLE Assessment: Exceptions to [facility] General Strength Comments: grossly 3+/5 LLE Assessment LLE Assessment: Exceptions to [facility] General Strength Comments: Grossly 3-/5  Care Tool Care Tool Bed Mobility Roll left and right activity   Roll left and right assist level: Minimal Assistance - Patient > 75%    Sit to lying activity   Sit to lying assist level: Contact Guard/Touching assist    Lying to sitting edge of bed activity   Lying to sitting edge of bed assist level: Contact Guard/Touching assist     Care Tool Transfers Sit to stand transfer   Sit to stand assist level: Minimal Assistance - Patient > 75%    Chair/bed transfer   Chair/bed transfer assist level: Minimal Assistance - Patient > 75%     Toilet transfer   Assist Level: Minimal Assistance - Patient > 75%    Car transfer   Car transfer assist level: Moderate  Assistance - Patient 50 - 74%      Care Tool Locomotion Ambulation   Assist level: Minimal Assistance - Patient > 75% Assistive device: Walker-rolling Max distance: 50 ft  Walk 10 feet activity   Assist level: Minimal Assistance - Patient > 75% Assistive device: Walker-rolling   Walk 50 feet with 2 turns activity   Assist level: Minimal Assistance - Patient > 75% Assistive device: Walker-rolling  Walk 150 feet activity Walk 150 feet activity  did not occur: Safety/medical concerns      Walk 10 feet on uneven surfaces activity Walk 10 feet on uneven surfaces activity did not occur: Safety/medical concerns      Stairs   Assist level: Minimal Assistance - Patient > 75% Stairs assistive device: 2 hand rails Max number of stairs: 1 (3")  Walk up/down 1 step activity   Walk up/down 1 step (curb) assist level: Minimal Assistance - Patient > 75% Walk up/down 1 step or curb assistive device: 2 hand rails Walk up/down 4 steps activity did not occuR: Safety/medical concerns  Walk up/down 4 steps activity      Walk up/down 12 steps activity Walk up/down 12 steps activity did not occur: Safety/medical concerns      Pick up small objects from floor Pick up small object from the floor (from standing position) activity did not occur: Safety/medical concerns      Wheelchair Will patient use wheelchair at discharge?: Yes Type of Wheelchair: Manual   Wheelchair assist level: Supervision/Verbal cueing Max wheelchair distance: 100 ft  Wheel 50 feet with 2 turns activity   Assist Level: Supervision/Verbal cueing  Wheel 150 feet activity   Assist Level: Moderate Assistance - Patient 50 - 74%    Refer to Care Plan for Long Term Goals  SHORT TERM GOAL WEEK 1 PT Short Term Goal 1 (Week 1): Pt will navigate 3" stairs x 8 PT Short Term Goal 2 (Week 1): Pt will ambulate 200 ft with LRAD PT Short Term Goal 3 (Week 1): Pt will perform x 10 min exercise to improve functional  endurance.  Recommendations for other services: None   Skilled Therapeutic Intervention Mobility Bed Mobility Bed Mobility: Rolling Right;Right Sidelying to Sit;Sitting - Scoot to Edge of Bed Rolling Right: Moderate Assistance - Patient 50-74% Right Sidelying to Sit: Moderate Assistance - Patient 50-74% Sitting - Scoot to [employer] of Bed: Contact Guard/Touching assist Transfers Transfers: Sit to Stand;Stand to Sit;Stand Pivot Transfers Sit to Stand: Minimal Assistance - Patient > 75% Stand to Sit: Minimal Assistance - Patient > 75% Stand Pivot Transfers: Minimal Assistance - Patient > 75% Stand Pivot Transfer Details: Verbal cues for technique;Verbal cues for safe use of DME/AE;Verbal cues for precautions/safety;Verbal cues for sequencing Transfer (Assistive device): Rolling walker Locomotion  Gait Gait Distance (Feet): 100 Feet Assistive device: Rolling walker Gait Gait Pattern: Impaired Gait velocity: reduced Stairs / Additional Locomotion Stairs: Yes Stairs Assistance: Minimal Assistance - Patient > 75% Stair Management Technique: Two rails Number of Stairs: 1 (3") Height of Stairs: 3 Ramp: Minimal Assistance - Patient >75% Wheelchair Mobility Wheelchair Mobility: Yes Wheelchair Assistance: Chartered loss adjuster: Both upper extremities Wheelchair Parts Management: Needs assistance Distance: 100  Session 1: Evaluation completed (see details above and below) with education on PT POC and goals and individual treatment initiated with focus on  initiating functional mobility. Pt received in recliner and agreeable to therapy.  No complaint of pain. Therapist donned ted hose, pt able to don slip on shoes with supervision.  Session focused on assessing current level of function. Pt performed ambulatory transfer with RW throughout session with min A, Sit to stand with Min A to RW. Car transfer with mod A, gait x 10 ft with RW min A . Pt propelled w/c with BUE up  to 150 ft with supervision. Pt returned to room and opted to remain in w/c, was left with all needs in reach and alarm active.   Session 2: pt received in bed and  agreeable to therapy. No complaint of pain. Min A bed mobility. Strength and sensation testing performed while seated EOB as documented above. Pt propelled w/c with BUE throughout session with supervision. Gait up to 50 ft with RW and min A, pt navigated ramp in same manner. Pt navigated 1 x 3" step with min A and 2 handrails,reported familiar arthritic pain in his knee, activity was discontinued. Pt returned to room and requested to use the bathroom. ambulatory transfer with RW, pt CGA for clothing management. ambulatory transfer to bed with RW. Mod A sit>supine and scooting up in bed.  Pt was left with all needs in reach and alarm active.    Discharge Criteria: Patient will be discharged from PT if patient refuses treatment 3 consecutive times without medical reason, if treatment goals not met, if there is a change in medical status, if patient makes no progress towards goals or if patient is discharged from hospital.  The above assessment, treatment plan, treatment alternatives and goals were discussed and mutually agreed upon: by patient  Mickel Fuchs 02/04/2021, 4:37 PM

## 2021-02-05 DIAGNOSIS — J869 Pyothorax without fistula: Secondary | ICD-10-CM

## 2021-02-05 DIAGNOSIS — A4151 Sepsis due to Escherichia coli [E. coli]: Secondary | ICD-10-CM | POA: Diagnosis not present

## 2021-02-05 DIAGNOSIS — R188 Other ascites: Secondary | ICD-10-CM

## 2021-02-05 DIAGNOSIS — M4620 Osteomyelitis of vertebra, site unspecified: Secondary | ICD-10-CM | POA: Diagnosis not present

## 2021-02-05 MED ORDER — SORBITOL 70 % SOLN
60.0000 mL | Freq: Once | Status: AC
  Administered 2021-02-05: 60 mL via ORAL
  Filled 2021-02-05: qty 60

## 2021-02-05 NOTE — Progress Notes (Signed)
Pt has home CPAP and will place on himself when ready.

## 2021-02-05 NOTE — Progress Notes (Signed)
PHARMACY CONSULT NOTE FOR:  OUTPATIENT  PARENTERAL ANTIBIOTIC THERAPY (OPAT)  Indication: abscess/empyema Regimen: cefazolin 2g IV q8h  End date: 03/11/21  IV antibiotic discharge orders are pended. To discharging provider:  please sign these orders via discharge navigator,  Select New Orders & click on the button choice - Manage This Unsigned Work.     Thank you for allowing pharmacy to be a part of this patient's care.  Georgeann Oppenheim 02/05/2021, 11:50 AM

## 2021-02-05 NOTE — Progress Notes (Signed)
Occupational Therapy Session Note  Patient Details  Name: Marco Kennedy MRN: [id number] Date of Birth: 10-Jan-1951  Today's Date: 02/06/2021 OT Individual Time: 9937-1696 OT Individual Time Calculation (min): 57 min   Skilled Therapeutic Interventions/Progress Updates:    Pt greeted in bed with c/o lower and midback pain. RN in at start of session to provide pain medicine. Pt was agreeable to participate in bathing/dressing tasks today EOB at sit<stand level using RW. Supervision for supine<sit. Note that pt needed the bed elevated for completing sit<stand at Min A level, Min A also for dynamic standing balance during LB self care. He needed assistance to functionally reach feet and buttocks due to ROM limitations. Would benefit from AE training. Setup for UB self care. He used the RW to ambulate short distance to the recliner with CGA-Min A. Left him comfortably in the recliner, all needs within reach and chair alarm set. Tx focus placed on ADL retraining, sit<stands, and standing balance/standing endurance.   Therapy Documentation Precautions:  Precautions Precautions: Fall Precaution Comments: monitor SpO2; L drain Restrictions Weight Bearing Restrictions: No  ADL: ADL Eating: Not assessed Grooming: Supervision/safety Upper Body Bathing: Supervision/safety Where Assessed-Upper Body Bathing: Sitting at sink Lower Body Bathing: Maximal assistance Upper Body Dressing: Minimal assistance Lower Body Dressing: Dependent Toileting: Not assessed Toilet Transfer: Moderate assistance Toilet Transfer Method: Stand pivot Tub/Shower Transfer: Not assessed     Therapy/Group: Individual Therapy  Marco Kennedy 02/06/2021, 12:11 PM

## 2021-02-05 NOTE — Progress Notes (Signed)
Physical Therapy Session Note  Patient Details  Name: Marco Kennedy MRN: [id number] Date of Birth: 05/30/1951  Today's Date: 02/05/2021 PT Individual Time: 0800-0900, 1440-1525 PT Individual Time Calculation (min): 60 min, 45 min  Short Term Goals: Week 1:  PT Short Term Goal 1 (Week 1): Pt will navigate 3" stairs x 8 PT Short Term Goal 2 (Week 1): Pt will ambulate 200 ft with LRAD PT Short Term Goal 3 (Week 1): Pt will perform x 10 min exercise to improve functional endurance.  Skilled Therapeutic Interventions/Progress Updates:    Session 1: pt received in bed and agreeable to therapy. Nursing present for meds pass, IV team present to disconnect PICC line. Pt assisted with donning TED hose, pants, able to don shoes with min for L foot. Pt able to pants over hips in standing CGA. ambulatory transfer to w/c with CGA. STS wth min A throughout session. Pt requires VC for anterior weight shift.   Session focused on building cardiovascular endurance. Pt propelled w/c with BUE x 250 ft for cardiovascular endurance. ambulatory transfer to nustep. Nustep 2 x 6 min at lv 6 with 2 min rest break. Vitals at break and completion of exercise ~114-116 bpm, O2=91-91%   Pt returned to room and remained in w/c, pt was left with all needs in reach and alarm active.   Session 2: pt received in bed and agreeable to therapy. No complaint of pain. Supine>sit supervision with use of bed features. Pt donned shoes with supervision and walked to w/c with min A and RW.   Pt transported to therapy gym for time management and energy conservation. Pt transferred to mat table with min A. Pt performed 5xSTS test = 1 min 25 sec. Pt then performed 3 sets of 5 sit to stands with CGA to min A at times. Pt then performed steps ups on 3" step, ascending with RLE and descending on LLE d/t pt is unable to lift LLE onto step at this time. Step ups 3x5. Pt then ambulated back to room with RW and CGA, x 100 ft.  Pt demoed  extreme sweating and fatigue in response to exercise. Pt had increased respiratory rate but reported no shortness of breath, lightheadedness, etc. Pt returned to recliner at end of session and was left with all needs in reach and alarm active.     Therapy Documentation Precautions:  Precautions Precautions: Fall Precaution Comments: monitor SpO2; L drain Restrictions Weight Bearing Restrictions: No General:   Vital Signs:   Pain: Pain Assessment Pain Scale: 0-10 Pain Score: 4  Pain Type: Acute pain Pain Location: Back Pain Orientation: Lower Pain Descriptors / Indicators: Aching Pain Frequency: Intermittent Pain Onset: With Activity (therapy) Pain Intervention(s): Medication (See eMAR) Mobility:   Locomotion :    Trunk/Postural Assessment :    Balance:   Exercises:   Other Treatments:      Therapy/Group: Individual Therapy  [name] 02/05/2021, 8:50 AM

## 2021-02-05 NOTE — Progress Notes (Signed)
Occupational Therapy Session Note  Patient Details  Name: Marco Kennedy MRN: [id number] Date of Birth: 1951-01-26  Today's Date: 02/05/2021 OT Individual Time: 8295-6213 OT Individual Time Calculation (min): 73 min    Short Term Goals: Week 1:  OT Short Term Goal 1 (Week 1): Pt will complete BSC/toilet transfers CGA with LRAD OT Short Term Goal 2 (Week 1): Pt will complete ADL/activity of choice for 10 mins with minimal fatigue OT Short Term Goal 3 (Week 1): Pt will complete LB dress with AE PRN no more than MOD A  Skilled Therapeutic Interventions/Progress Updates:    Pt greeted at time of session sitting up in wheelchair agreeable to OT, no reports of pain. Pt stating pants too tight, provided with new paper scrubs for comfort. Wanting to shave first, self propel to sink and shave with electric razor and straight razor needing extended time as pt is hyperverbal and verbose throughout but performed with Min A as therapist helped only with shaving under chin/neck area d/t difficulty to reach. UB bathing sink level (declined LB today) with Supervision and donned shirt same manner. Declined shorts from home in favor of scrub pants cut into shorts and donned with Max/total A able to stand at sink surface to help pull over hips today but needing assist. Sit <> stands Min/CGA throughout session. Walked approx 5 feet wheelchair > bed with CGA and RW prior to sit > supine with Mod/Max A for LE management. Attempted low back stretch bed level with minimal change. Alarm on call bell in reach.   Therapy Documentation Precautions:  Precautions Precautions: Fall Precaution Comments: monitor SpO2; L drain Restrictions Weight Bearing Restrictions: No    Therapy/Group: Individual Therapy  Erasmo Score 02/05/2021, 7:16 AM

## 2021-02-05 NOTE — Progress Notes (Signed)
[facility]  Date of Admission:  02/03/2021     CC: Hardware associated thoracic vertebral om/epidural absess   Lines:  8/02-c LLQ abd perc-drain via IR 7/21-c Right ue picc     Abx: 7/30-c cefazolin  7/26-30 cefepime 7/26-7/28 vanc Home iv cefazolin                                                          Assessment/Plan 70 yo male previous thoracolumbar hardware/back surgery 03/2020 complicated by thoracic vertebral om/epidural abscess s/p recent I&D 7/19 with cx ecoli (bcx negative at that time), readmitted a few days after discharged on 7/22 for fluid overload and bilateral pleural effusion and fever, s/p bilateral chest tube placement with effusion appearing exudative. Course also with ct imaging showing left retroperitoneal fluid collection s/p IR drain placement 8/02 with negative cx, and right knee effusion s/p aspiration 8/3 without evidence of infection either. Patient currently in rehab     #vertebral OM/epidural abscess -- hardware related #retroperitoneal fluid collection #exudative bilateral pleural effusion/empyema Initial epidural abscess I&D 7/19; cx ecoli Pleural fluid cx negative 7/16 and 7/26 Repeat bcx this admission negative  Suspect continguous spinal spread to the pleural space S/p bilateral chest tubes which were removed by 7/30 7/27 tte no evidence endocarditis 8/01 panbody ct showed abd fluid collection s/p IR drain placement 8/02 -- cx negative 8/03 repeat mri thoracic/lumbar spine show stable epidural abscess  Clinically patient doing well minimal back pain, no respiratory sx, afebrile, improved leukocytosis on cefazolin  -continue cefazolin 2 gram iv q8hours; plan 6 weeks until 9/14 -will need repeat imaging first/second week of September (ct spine/abd-pelv-chest) -weekly cbc, cmp, crp while inpatient   #right knee effusion 8/03 aspirate cx negative; no sign of septic arthritis  -monitor     Discussed with primary team --> please reengage ID before patient discharge for further ID evaluation    Principal Problem:   Vertebral osteomyelitis (HCC) Active Problems:   Obesity, Class III, BMI 40-49.9 (morbid obesity) (HCC)   Sepsis due to Escherichia coli (E. coli) (HCC)   Sepsis (HCC)   No Known Allergies  Scheduled Meds:  Chlorhexidine Gluconate Cloth  6 each Topical Daily   colchicine  0.6 mg Oral Daily   enoxaparin (LOVENOX) injection  40 mg Subcutaneous Daily   feeding supplement  237 mL Oral TID BM   multivitamin with minerals  1 tablet Oral Daily   sodium chloride flush  5 mL Intracatheter TID   sorbitol  60 mL Oral Once   Continuous Infusions:   ceFAZolin (ANCEF) IV 2 g (02/05/21 0538)   PRN Meds:.acetaminophen **OR** acetaminophen, ondansetron **OR** ondansetron (ZOFRAN) IV, oxyCODONE, polyethylene glycol, sodium chloride flush   SUBJECTIVE: Doing well, minimal back pain No n/v/diarrhea No f/c  Right knee without worsening swelling or pain    Review of Systems: ROS All other ROS was negative, except mentioned above     OBJECTIVE: Vitals:   02/04/21 0426 02/04/21 1432 02/04/21 1929 02/05/21 0326  BP: (!) 147/79 (!) 143/77 (!) 149/77 131/82  Pulse: 92 77 98 85  Resp: 18 18 18 18   Temp: 99.6 F (37.6 C) 98.5 F (36.9 C) 99.1 F (37.3 C) 99.5 F (37.5 C)  TempSrc: Oral Oral Oral  SpO2: 92% 98% 94% 93%  Weight: 115.6 kg   114 kg  Height:       Body mass index is 39.36 kg/m.  Physical Exam General/constitutional: no distress, pleasant HEENT: Normocephalic, PER, Conj Clear, EOMI, Oropharynx clear Neck supple CV: rrr no mrg Lungs: clear to auscultation, normal respiratory effort Abd: Soft, Nontender Ext: no edema Skin: No Rash Neuro: nonfocal MSK: no peripheral joint swelling/tenderness/warmth; back spines nontender   Central line presence: rue picc site no purulence/tenderness    Lab Results Lab Results  Component Value  Date   WBC 11.7 (H) 02/04/2021   HGB 9.0 (L) 02/04/2021   HCT 28.7 (L) 02/04/2021   MCV 88.9 02/04/2021   PLT 489 (H) 02/04/2021    Lab Results  Component Value Date   CREATININE 0.71 02/04/2021   BUN 14 02/04/2021   NA 132 (L) 02/04/2021   K 4.1 02/04/2021   CL 91 (L) 02/04/2021   CO2 33 (H) 02/04/2021    Lab Results  Component Value Date   ALT 27 02/04/2021   AST 49 (H) 02/04/2021   ALKPHOS 90 02/04/2021   BILITOT 0.4 02/04/2021      Microbiology: Recent Results (from the past 240 hour(s))  Aerobic/Anaerobic Culture w Gram Stain (surgical/deep wound)     Status: None   Collection Time: 01/27/21  5:25 PM   Specimen: Abscess  Result Value Ref Range Status   Specimen Description ABSCESS  Final   Special Requests Normal  Final   Gram Stain   Final    RARE WBC PRESENT,BOTH PMN AND MONONUCLEAR NO ORGANISMS SEEN    Culture   Final    No growth aerobically or anaerobically. Performed at [facility], [address], [place], [place] [address]    Report Status 02/01/2021 FINAL  Final  Body fluid culture w Gram Stain     Status: None   Collection Time: 01/28/21 12:57 PM   Specimen: Body Fluid  Result Value Ref Range Status   Specimen Description FLUID  Final   Special Requests SYNOVIAL RIGHT KNEE  Final   Gram Stain   Final    ABUNDANT WBC PRESENT, PREDOMINANTLY PMN NO ORGANISMS SEEN    Culture   Final    NO GROWTH 3 DAYS Performed at [facility], [address], [place], [place] [address]    Report Status 02/01/2021 FINAL  Final     Serology:   Imaging: If present, new imagings (plain films, ct scans, and mri) have been personally visualized and interpreted; radiology reports have been reviewed. Decision making incorporated into the Impression / Recommendations.  7/27 tte  1. Left ventricular ejection fraction, by estimation, is 65 to 70%. The  left ventricle has normal function. The left ventricle has no regional  wall motion abnormalities.  There is mild left ventricular hypertrophy.  Left ventricular diastolic parameters  were normal.   2. Right ventricular systolic function is normal. The right ventricular  size is normal. Tricuspid regurgitation signal is inadequate for assessing  PA pressure.   3. The mitral valve is normal in structure. No evidence of mitral valve  regurgitation.   4. The aortic valve was not well visualized. Aortic valve regurgitation  is not visualized. No aortic stenosis is present.   5. The inferior vena cava is normal in size with greater than 50%  respiratory variability, suggesting right atrial pressure of 3 mmHg.   Conclusion(s)/Recommendation(s): No vegetation seen. If high clinical  suspicion for endocarditis,  consider TEE.    8/01 abd/chest/pelv ct Small loculated pleural fluid collections bilaterally RIGHT greater than LEFT which contain foci of air, could either represent empyema or prior instrumentation/aspiration, slightly increased.   Compressive atelectasis in the lower lobes.   Loculated fluid versus less likely mass at medial RIGHT apex 2.3 x 2.1 cm; recommend attention on follow-up imaging.   Large LEFT retroperitoneal fluid collection 10.1 x 6.4 x 7.1 cm little changed, could represent infected or sterile collection.   Lucency surrounding the pedicle screws bilaterally at T10 favoring infection, question slightly increased on RIGHT since previous study.     8/03 thoracic/lumbar spine mri 1. 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 Circumferential dural thickening at the T10 level again effaces the thecal sac with mass effect on the spinal cord. 2. New abnormal disc signal at T9-10 and unchanged abnormal signal T10-11, consistent with discitis-osteomyelitis. 3. No lumbar spinal canal stenosis. 4. Loculated bilateral pleural collections.   8/05 mri right knee 1. Exam limited by patient  motion. 2. Moderate to large joint effusion with moderate irregular surrounding synovial enhancement. Certainly could not exclude septic arthritis but this could also be non infectious synovitis. Recommend knee joint aspiration for further evaluation. 3. No findings suspicious for osteomyelitis. 4. Probable cellulitis and myositis without discrete soft tissue abscess or pyomyositis. 5. Chronic ACL deficient knee. 6. Tricompartmental degenerative changes most significant in the lateral.     Raymondo Band, MD [facility] [phone] pager    02/05/2021, 2:24 PM

## 2021-02-05 NOTE — Progress Notes (Addendum)
PROGRESS NOTE   Subjective/Complaints:   Pt reports peeing well- no issues;  Feels very constipated, but doesn't want to have BM during therapy- willing to try Sorbitol AFTER therapy.   Said couldn't do stairs, even 3 inch step yesterday.  KUB shows mild to mod stool burden.    ROS:  Pt denies SOB, abd pain, CP, N/V, and vision changes    Objective:   DG Abd 1 View  Result Date: 02/04/2021 CLINICAL DATA:  Constipation.  Abdominal distension and pain. EXAM: ABDOMEN - 1 VIEW COMPARISON:  April 18, 2020. FINDINGS: No abnormal bowel dilatation is noted. Mild to moderate amount of stool is seen throughout the colon. Drainage catheter is noted in left lower quadrant. Status post surgical posterior fusion of visualized lower thoracic and lumbar spine. IMPRESSION: Mild to moderate stool burden. No abnormal bowel dilatation is noted. Electronically Signed   By: Lupita Raider M.D.   On: 02/04/2021 10:25   Recent Labs    02/04/21 0425  WBC 11.7*  HGB 9.0*  HCT 28.7*  PLT 489*   Recent Labs    02/04/21 0425  NA 132*  K 4.1  CL 91*  CO2 33*  GLUCOSE 132*  BUN 14  CREATININE 0.71  CALCIUM 8.1*    Intake/Output Summary (Last 24 hours) at 02/05/2021 1020 Last data filed at 02/05/2021 0900 Gross per 24 hour  Intake 123 ml  Output 1780 ml  Net -1657 ml        Physical Exam: Vital Signs Blood pressure 131/82, pulse 85, temperature 99.5 F (37.5 C), resp. rate 18, height 5\' 7"  (1.702 m), weight 114 kg, SpO2 93 %.    General: awake, alert, appropriate, sitting up in bed; NAD HENT: conjugate gaze; oropharynx moist CV: regular rate; no JVD Pulmonary: CTA B/L; no W/R/R- good air movement GI: soft, NT, but appears distended; hypoactive Psychiatric: appropriate - slightly delayed in responses Neurological: Ox3  Musculoskeletal:     Cervical back: Normal range of motion and neck supple.     Comments: Ue's 5/5 B/L  in biceps, triceps, WE< grip and finger abd B/L LE's  RLE- HF 4+/5, KE 5-/5, DF 3+/5 and PF 4/5 LLE- HF 4-/5, KE 5-/5, DF 2+/5 and PF 4-/5   Bandage on R superior knee from tap/injection  Skin:    Comments: A small area of peeled skin/MASD? On inner buttock on R Also honeycomb dressing on thoracic spine seen- no significant drainage  Neurological:     Comments: Patient is alert in no acute distress oriented x3 and follows commands. Intact to light touch in all 4 extremities      Assessment/Plan: 1. Functional deficits which require 3+ hours per day of interdisciplinary therapy in a comprehensive inpatient rehab setting. Physiatrist is providing close team supervision and 24 hour management of active medical problems listed below. Physiatrist and rehab team continue to assess barriers to discharge/monitor patient progress toward functional and medical goals  Care Tool:  Bathing    Body parts bathed by patient: Right arm, Left arm, Chest, Abdomen, Front perineal area, Right upper leg, Left upper leg, Face   Body parts bathed by helper: Buttocks, Right lower  leg, Left lower leg     Bathing assist Assist Level: Maximal Assistance - Patient 24 - 49%     Upper Body Dressing/Undressing Upper body dressing   What is the patient wearing?: Hospital gown only    Upper body assist Assist Level: Minimal Assistance - Patient > 75%    Lower Body Dressing/Undressing Lower body dressing      What is the patient wearing?: Pants     Lower body assist Assist for lower body dressing: Total Assistance - Patient < 25%     Toileting Toileting    Toileting assist Assist for toileting: Independent with assistive device Assistive Device Comment:  (urinal)   Transfers Chair/bed transfer  Transfers assist     Chair/bed transfer assist level: Minimal Assistance - Patient > 75%     Locomotion Ambulation   Ambulation assist      Assist level: Minimal Assistance - Patient >  75% Assistive device: Walker-rolling Max distance: 50 ft   Walk 10 feet activity   Assist     Assist level: Minimal Assistance - Patient > 75% Assistive device: Walker-rolling   Walk 50 feet activity   Assist    Assist level: Minimal Assistance - Patient > 75% Assistive device: Walker-rolling    Walk 150 feet activity   Assist Walk 150 feet activity did not occur: Safety/medical concerns         Walk 10 feet on uneven surface  activity   Assist Walk 10 feet on uneven surfaces activity did not occur: Safety/medical concerns         Wheelchair     Assist Will patient use wheelchair at discharge?: Yes Type of Wheelchair: Manual    Wheelchair assist level: Supervision/Verbal cueing Max wheelchair distance: 250 ft    Wheelchair 50 feet with 2 turns activity    Assist        Assist Level: Supervision/Verbal cueing   Wheelchair 150 feet activity     Assist      Assist Level: Supervision/Verbal cueing   Blood pressure 131/82, pulse 85, temperature 99.5 F (37.5 C), resp. rate 18, height 5\' 7"  (1.702 m), weight 114 kg, SpO2 93 %.  Medical Problem List and Plan: 1.   Debility secondary to sepsis due to E. Coli/psoas abscess.S/Pwound debridement 7/19             -patient may not shower until drain removed             -ELOS/Goals: 12-16 days Supervision  -con't PT and OT- CIR 2.  Antithrombotics: -DVT/anticoagulation: Lovenox             -antiplatelet therapy: N/A 3. Pain Management: Oxycodone as needed 4. Mood: Provide emotional support             -antipsychotic agents: N/A 5. Neuropsych: This patient is capable of making decisions on his own behalf. 6. Skin/Wound Care: Routine skin checks- will remove honeycomb in a few days.  7. Fluids/Electrolytes/Nutrition: Routine in and out with follow up chemestries 8.ID.  Continue Cefazolin 2 gram every 8 hours through 03/11/2021 per infectious disease. Will f/u surgery to see when needs to  have drain removed.   8/11- drain they are thinking to remove 8/20, but keep good record of output and if does better/minimal drainage, will remove prior. WBC down to 11.7 from 12.9 and 13.8- will con't to monitor 9.  Left knee pseudogout.  Status post aspiration.  Continue colchicine  8/11- Cr 0.71 on colchicine- con't to monitor  10.  Acute on chronic anemia.  Follow-up CBC  8/11- HB 9.0- stable- con't regimen 11.  Hyponatremia.  Follow-up chemistries 12.Obesity.WLN98.92.Follow up dietery 13.  OSA.  BiPAP- pt says pressure too high and wears O2 instead.  14. Constipation 8/11- will give Sorbitol 60cc after therapy today- and monitor       LOS: 2 days A FACE TO FACE EVALUATION WAS PERFORMED  Marco Kennedy 02/05/2021, 10:20 AM

## 2021-02-06 LAB — CBC WITH DIFFERENTIAL/PLATELET
Abs Immature Granulocytes: 0.1 10*3/uL — ABNORMAL HIGH (ref 0.00–0.07)
Basophils Absolute: 0.1 10*3/uL (ref 0.0–0.1)
Basophils Relative: 1 %
Eosinophils Absolute: 0.1 10*3/uL (ref 0.0–0.5)
Eosinophils Relative: 1 %
HCT: 28.7 % — ABNORMAL LOW (ref 39.0–52.0)
Hemoglobin: 8.8 g/dL — ABNORMAL LOW (ref 13.0–17.0)
Immature Granulocytes: 1 %
Lymphocytes Relative: 8 %
Lymphs Abs: 0.9 10*3/uL (ref 0.7–4.0)
MCH: 27.3 pg (ref 26.0–34.0)
MCHC: 30.7 g/dL (ref 30.0–36.0)
MCV: 89.1 fL (ref 80.0–100.0)
Monocytes Absolute: 1 10*3/uL (ref 0.1–1.0)
Monocytes Relative: 8 %
Neutro Abs: 9.8 10*3/uL — ABNORMAL HIGH (ref 1.7–7.7)
Neutrophils Relative %: 81 %
Platelets: 435 10*3/uL — ABNORMAL HIGH (ref 150–400)
RBC: 3.22 MIL/uL — ABNORMAL LOW (ref 4.22–5.81)
RDW: 13.4 % (ref 11.5–15.5)
WBC: 11.9 10*3/uL — ABNORMAL HIGH (ref 4.0–10.5)
nRBC: 0 % (ref 0.0–0.2)

## 2021-02-06 LAB — COMPREHENSIVE METABOLIC PANEL
ALT: 22 U/L (ref 0–44)
AST: 47 U/L — ABNORMAL HIGH (ref 15–41)
Albumin: 1.8 g/dL — ABNORMAL LOW (ref 3.5–5.0)
Alkaline Phosphatase: 91 U/L (ref 38–126)
Anion gap: 9 (ref 5–15)
BUN: 12 mg/dL (ref 8–23)
CO2: 33 mmol/L — ABNORMAL HIGH (ref 22–32)
Calcium: 8.5 mg/dL — ABNORMAL LOW (ref 8.9–10.3)
Chloride: 91 mmol/L — ABNORMAL LOW (ref 98–111)
Creatinine, Ser: 0.67 mg/dL (ref 0.61–1.24)
GFR, Estimated: >60 mL/min (ref >60)
Glucose, Bld: 124 mg/dL — ABNORMAL HIGH (ref 70–99)
Potassium: 4 mmol/L (ref 3.5–5.1)
Sodium: 133 mmol/L — ABNORMAL LOW (ref 135–145)
Total Bilirubin: 0.3 mg/dL (ref 0.3–1.2)
Total Protein: 5.5 g/dL — ABNORMAL LOW (ref 6.5–8.1)

## 2021-02-06 LAB — C-REACTIVE PROTEIN: CRP: 10.4 mg/dL — ABNORMAL HIGH (ref <1.0)

## 2021-02-06 MED ORDER — FLEET ENEMA 7-19 GM/118ML RE ENEM
1.0000 | ENEMA | Freq: Once | RECTAL | Status: AC
  Administered 2021-02-06: 1 via RECTAL
  Filled 2021-02-06: qty 1

## 2021-02-06 MED ORDER — MAGNESIUM HYDROXIDE 400 MG/5ML PO SUSP
15.0000 mL | Freq: Once | ORAL | Status: AC
  Administered 2021-02-06: 15 mL via ORAL
  Filled 2021-02-06: qty 30

## 2021-02-06 MED ORDER — HEPARIN SOD (PORK) LOCK FLUSH 100 UNIT/ML IV SOLN
250.0000 [IU] | INTRAVENOUS | Status: DC | PRN
  Filled 2021-02-06: qty 2.5

## 2021-02-06 NOTE — Progress Notes (Signed)
Physical Therapy Session Note  Patient Details  Name: Marco Kennedy MRN: [id number] Date of Birth: 12-28-1950  Today's Date: 02/06/2021 PT Individual Time: 1100-1200 PT Individual Time Calculation (min): 60 min   Short Term Goals: Week 1:  PT Short Term Goal 1 (Week 1): Pt will navigate 3" stairs x 8 PT Short Term Goal 2 (Week 1): Pt will ambulate 200 ft with LRAD PT Short Term Goal 3 (Week 1): Pt will perform x 10 min exercise to improve functional endurance.  Skilled Therapeutic Interventions/Progress Updates:    Session 1: Pt received in recliner and agreeable to therapy.  No complaint of pain. ambulatory transfer to w/c with RW and CGA. Sit to stand throughout session with CGA.  Pt propelled w/c with BUE to/from gym with supervision. Pt performed the following exercises to promote LE strength and endurance:  -2 x 5 Sit to stand  -elevated lunges x 10  Gait x 160 ft with RW and CGA. Pt demoes kyphotic posture that only mildly correct with cueing. Pt returned to recliner after session and was left with all needs in reach and alarm active.   Session 2: Pt received in recliner and agreeable to therapy.  No complaint of pain. Transfers in the same manner as first session.   Pt propelled w/c with BUE to/from therapy gym for cardiovascular endurance. Stand pivot transfer to nu step with RW and CGA. Pt performed activity for 2 bouts of 6 min with ~2 min seated rest break at level 6, ~65 spm throughout.  Vitals at end of first bout: HR=117, O2=93 Vitals at end of second bout: HR = 135, O2= 95  Pt performed the following exercises to promote LE strength and endurance:  -Standing marches -3 x 10 knee drives on LLE HR=137, O2= 95 at completion of exercises  Pt returned to recliner after session and was left with all needs in reach and alarm active.   Therapy Documentation Precautions:  Precautions Precautions: Fall Precaution Comments: monitor SpO2; L drain Restrictions Weight  Bearing Restrictions: No   Therapy/Group: Individual Therapy  [name] 02/06/2021, 4:24 PM

## 2021-02-06 NOTE — Progress Notes (Addendum)
Referring Physician(s): Dr. West Bali, S.   Supervising Physician: Arne Cleveland  Patient Status:  [facility] - In-pt  Chief Complaint:  Left retroperitoneal fluid collection S/p drain placement with IR on 8/2, changed from suction bulb to gravity bag due to high OP on 8/5   Subjective:  Pt laying in a recliner, not in acute distress.   No complaints regarding L RP drain, denies chills, N/V.  States that the drain is being flushed every day.  Allergies: Patient has no known allergies.  Medications: Prior to Admission medications   Medication Sig Start Date End Date Taking? Authorizing Provider  ceFAZolin (ANCEF) IVPB Inject 2 g into the vein every 8 (eight) hours. Indication:  vertebral osteomyelitis First Dose: No Last Day of Therapy:  02/24/2021 Labs - Once weekly:  CBC/D and BMP, Labs - Every other week:  ESR and CRP Method of administration: IV Push Method of administration may be changed at the discretion of home infusion pharmacist based upon assessment of the patient and/or caregiver's ability to self-administer the medication ordered. 01/15/21 02/24/21  Marvis Moeller, NP  cholecalciferol (VITAMIN D3) 25 MCG (1000 UNIT) tablet Take 1,000 Units by mouth daily.    [provider]  diclofenac (VOLTAREN) 75 MG EC tablet Take 75 mg by mouth 2 (two) times daily. 11/13/20   [provider]  Multiple Vitamin (MULTIVITAMIN WITH MINERALS) TABS tablet Take 1 tablet by mouth daily.    [provider]  oxyCODONE (OXY IR/ROXICODONE) 5 MG immediate release tablet Take 1 tablet (5 mg total) by mouth every 6 (six) hours as needed for moderate pain. 01/15/21   Little Ishikawa, MD  polyethylene glycol (MIRALAX / GLYCOLAX) 17 g packet Take 17 g by mouth 2 (two) times daily. 01/15/21   Little Ishikawa, MD     Vital Signs: BP 135/81 (BP Location: Left Arm)   Pulse 74   Temp 98.6 F (37 C)   Resp 18   Ht 5' 7"  (1.702 m)   Wt 247 lb 9.2 oz (112.3 kg)    SpO2 98%   BMI 38.78 kg/m   Physical Exam Vitals reviewed.  Constitutional:      Appearance: Normal appearance.  HENT:     Head: Normocephalic and atraumatic.  Pulmonary:     Effort: Pulmonary effort is normal.  Abdominal:     General: Abdomen is flat.     Palpations: Abdomen is soft.  Skin:    General: Skin is warm and dry.     Coloration: Skin is not jaundiced or pale.     Comments: Positive L RP drain to a gravity bag Site is unremarkable with no erythema, edema, tenderness, bleeding or drainage. Stat lock in place. Dressing is clean, dry, and intact.  Trace of  clear yellow colored fluid noted in the bag. Drain aspirates and flushes well.    Neurological:     Mental Status: He is alert and oriented to person, place, and time.  Psychiatric:        Mood and Affect: Mood normal.        Behavior: Behavior normal.    Imaging: DG Abd 1 View  Result Date: 02/04/2021 CLINICAL DATA:  Constipation.  Abdominal distension and pain. EXAM: ABDOMEN - 1 VIEW COMPARISON:  April 18, 2020. FINDINGS: No abnormal bowel dilatation is noted. Mild to moderate amount of stool is seen throughout the colon. Drainage catheter is noted in left lower quadrant. Status post surgical posterior fusion of visualized lower  thoracic and lumbar spine. IMPRESSION: Mild to moderate stool burden. No abnormal bowel dilatation is noted. Electronically Signed   By: Marijo Conception M.D.   On: 02/04/2021 10:25    Labs:  CBC: Recent Labs    01/31/21 0500 02/01/21 0152 02/04/21 0425 02/06/21 0506  WBC 13.8* 12.9* 11.7* 11.9*  HGB 8.7* 9.0* 9.0* 8.8*  HCT 27.5* 28.4* 28.7* 28.7*  PLT 463* 499* 489* 435*     COAGS: Recent Labs    01/31/21 0500  INR 1.3*     BMP: Recent Labs    01/31/21 0500 02/01/21 0152 02/04/21 0425 02/06/21 0506  NA 132* 132* 132* 133*  K 4.0 4.2 4.1 4.0  CL 91* 92* 91* 91*  CO2 35* 34* 33* 33*  GLUCOSE 147* 140* 132* 124*  BUN 17 16 14 12   CALCIUM 7.7* 7.9* 8.1* 8.5*   CREATININE 0.76 0.80 0.71 0.67  GFRNONAA >60 >60 >60 >60     LIVER FUNCTION TESTS: Recent Labs    01/31/21 0500 02/01/21 0152 02/04/21 0425 02/06/21 0506  BILITOT 0.6 0.7 0.4 0.3  AST 79* 73* 49* 47*  ALT 36 34 27 22  ALKPHOS 115 107 90 91  PROT 4.9* 5.0* 5.4* 5.5*  ALBUMIN 1.4* 1.5* 1.7* 1.8*     Assessment and Plan:  70 yo male with history of lumbar spinal stenosis s/p lumbar fusions on multiple level on 04/18/2020.  History of abscess development near existing hardware s/p wound debridement by neurosurgery on 01/13/2021.  Currently admitted due to chest pain and anasarca, CT showed bilateral pleural effusion s/p chest tube placement by CCM and subsequently removed.  Repeat CT CAP on 01/26/2021 showed large left retroperitoneal fluid collection, patient is s/p aspiration and drain placement with IR on 01/27/2021. OP had been high, the drain changed from suction bulb to a gravity bag on 8/5.   Patient was discharged to CIR on 8/09 and fell off the rounding list, last seen by IR on 8/8.   Pt stable, drain intact, flushes and aspirates well. Puncture site unremarkable, no s/s of bleeding or infection.  OP 375 cc clear yellow (OP 80 cc on 8/10, no OP documented 8/8~8/9, OP 100 on 8/7) VSS WBC 11.9 today (11.7 yesterday) Cx No growth   OP on 8/6 was 210 cc, OP on 8/7 was 121 cc, OP on 8/10 80 cc, OP on 8/11 was 375 cc.  No OP was documented on 8/8, 8/9.  Patient unaware of significant increase in OP.  Discussed significant increase in OP with Dr. Vernard Gambles, will monitor OP closely over the weekend to determine if f/u imaging is needed.   Continue with flushing qd with 5 cc NS, output recording q shift and dressing changes as needed. Would consider additional imaging when output is less than 10 ml for 24 hours not including flush material.    Further treatment plan per CIR Appreciate and agree with the plan.  IR to follow.    Electronically Signed: Tera Mater, PA-C 02/06/2021,  9:42 AM   I spent a total of 15 Minutes at the the patient's bedside AND on the patient's hospital floor or unit, greater than 50% of which was counseling/coordinating care for L RP drain

## 2021-02-06 NOTE — Progress Notes (Signed)
Patient assisted with home cpap and will self place when ready.

## 2021-02-06 NOTE — Progress Notes (Signed)
Occupational Therapy Session Note  Patient Details  Name: Marco Kennedy MRN: [id number] Date of Birth: April 21, 1951  Today's Date: 02/07/2021 OT Individual Time: 6967-8938 and 1017-5102 OT Individual Time Calculation (min): 62 min and 44 min   Short Term Goals: Week 1:  OT Short Term Goal 1 (Week 1): Pt will complete BSC/toilet transfers CGA with LRAD OT Short Term Goal 2 (Week 1): Pt will complete ADL/activity of choice for 10 mins with minimal fatigue OT Short Term Goal 3 (Week 1): Pt will complete LB dress with AE PRN no more than MOD Marco  Skilled Therapeutic Interventions/Progress Updates:    Pt greeted in bed, premedicated for back pain. Reporting he had just taken an enema last night with minimal results. Pt with +bowel sounds this morning. Agreeable to start session by attempting to use the restroom. OT donned his Ted stockings after pt transitioned EOB. Setup for donning slide on sneakers. CGA for sit<stand from elevated bed using RW and then CGA for ambulatory transfer to the elevated toilet. OT modified toilet with Marco rolled up towel to improve back support/comfort. Pt with B+B void after OT warmed up some prune juice for pt to drink. Also educated pt on potty squat/rocking techniques to promote void with positive results. Worked on sit<stands and standing tolerance during perihygiene completion. Also introduced toilet tong education. Pt reported using them at home and having difficultly, vcs for technique. He would benefit from additional practice to further increase functional independence with toileting. He then returned to EOB and donned pants with Max Marco due to time constraints. CGA for short distance ambulatory transfer to the recliner using RW, note that bed height significantly elevated prior to all power ups. Setup for donning overhead shirt. He remained comfortably in the recliner at close of session, all needs within reach and chair alarm set.   2nd Session 1:1 tx (44 min) Pt  greeted in the recliner, requesting to start session by shaving and brushing his teeth. Set him up at the sink for shaving while seated. Min cues for thoroughness due to pt removing glasses and resultant visual deficits. Taught him adaptive tactile strategy for compensation. Min Marco for sit<stand and for standing balance while completing oral care. Increased time for pt to power up with vcs. Able to maintain balance with unilateral support on the sink counter. He then returned to sitting and was assisted with repositioning for comfort. Left pt with all needs within reach and chair alarm set, notified RN of pts request for pain medicine (pain in back + knees).   Therapy Documentation Precautions:  Precautions Precautions: Fall Precaution Comments: monitor SpO2; L drain Restrictions Weight Bearing Restrictions: No  ADL: ADL Eating: Not assessed Grooming: Supervision/safety Upper Body Bathing: Supervision/safety Where Assessed-Upper Body Bathing: Sitting at sink Lower Body Bathing: Maximal assistance Upper Body Dressing: Minimal assistance Lower Body Dressing: Dependent Toileting: Not assessed Toilet Transfer: Moderate assistance Toilet Transfer Method: Stand pivot Tub/Shower Transfer: Not assessed     Therapy/Group: Individual Therapy  Marco Kennedy Marco Kennedy 02/07/2021, 12:42 PM

## 2021-02-06 NOTE — Progress Notes (Signed)
PROGRESS NOTE   Subjective/Complaints:   Pt reports no BM with Sorbitol- it's been ~ 12 hours at least- will give Milk of Magnesium- 15 ml- at 2pm and enema fleet's at 5pm if no results.  Also eating a lot of bananas- explained need to stop for now.   Surgery on back 7/19- can remove honeycomb.   ROS:  Pt denies SOB, abd pain, CP, N/V and vision changes    Objective:   No results found. Recent Labs    02/04/21 0425 02/06/21 0506  WBC 11.7* 11.9*  HGB 9.0* 8.8*  HCT 28.7* 28.7*  PLT 489* 435*   Recent Labs    02/04/21 0425 02/06/21 0506  NA 132* 133*  K 4.1 4.0  CL 91* 91*  CO2 33* 33*  GLUCOSE 132* 124*  BUN 14 12  CREATININE 0.71 0.67  CALCIUM 8.1* 8.5*    Intake/Output Summary (Last 24 hours) at 02/06/2021 0851 Last data filed at 02/06/2021 0518 Gross per 24 hour  Intake 853 ml  Output 1825 ml  Net -972 ml        Physical Exam: Vital Signs Blood pressure 135/81, pulse 74, temperature 98.6 F (37 C), resp. rate 18, height 5\' 7"  (1.702 m), weight 112.3 kg, SpO2 98 %.     General: awake, alert, appropriate, on speaker phone with wife, ; NAD HENT: conjugate gaze; oropharynx moist CV: regular rate; no JVD Pulmonary: CTA B/L; no W/R/R- good air movement GI: soft, NT: more distended; more normoactive today Psychiatric: appropriate; joking some Neurological: Ox3  Musculoskeletal:     Cervical back: Normal range of motion and neck supple.     Comments: Ue's 5/5 B/L in biceps, triceps, WE< grip and finger abd B/L LE's  RLE- HF 4+/5, KE 5-/5, DF 3+/5 and PF 4/5 LLE- HF 4-/5, KE 5-/5, DF 2+/5 and PF 4-/5   Bandage on R superior knee from tap/injection  Skin:    Comments: A small area of peeled skin/MASD? On inner buttock on R Also honeycomb dressing on thoracic spine still there- no drainage seen; also drain in LLQ- ~ 5cc in bag attached.  Neurological:     Comments: Patient is alert in  no acute distress oriented x3 and follows commands. Intact to light touch in all 4 extremities      Assessment/Plan: 1. Functional deficits which require 3+ hours per day of interdisciplinary therapy in a comprehensive inpatient rehab setting. Physiatrist is providing close team supervision and 24 hour management of active medical problems listed below. Physiatrist and rehab team continue to assess barriers to discharge/monitor patient progress toward functional and medical goals  Care Tool:  Bathing    Body parts bathed by patient: Right arm, Left arm, Chest, Abdomen, Front perineal area, Right upper leg, Left upper leg, Face   Body parts bathed by helper: Buttocks, Right lower leg, Left lower leg     Bathing assist Assist Level: Maximal Assistance - Patient 24 - 49%     Upper Body Dressing/Undressing Upper body dressing   What is the patient wearing?: Pull over shirt    Upper body assist Assist Level: Contact Guard/Touching assist    Lower Body Dressing/Undressing  Lower body dressing      What is the patient wearing?: Pants     Lower body assist Assist for lower body dressing: Total Assistance - Patient < 25%     Toileting Toileting    Toileting assist Assist for toileting: Independent with assistive device Assistive Device Comment:  (urinal)   Transfers Chair/bed transfer  Transfers assist     Chair/bed transfer assist level: Minimal Assistance - Patient > 75%     Locomotion Ambulation   Ambulation assist      Assist level: Minimal Assistance - Patient > 75% Assistive device: Walker-rolling Max distance: 50 ft   Walk 10 feet activity   Assist     Assist level: Minimal Assistance - Patient > 75% Assistive device: Walker-rolling   Walk 50 feet activity   Assist    Assist level: Minimal Assistance - Patient > 75% Assistive device: Walker-rolling    Walk 150 feet activity   Assist Walk 150 feet activity did not occur: Safety/medical  concerns         Walk 10 feet on uneven surface  activity   Assist Walk 10 feet on uneven surfaces activity did not occur: Safety/medical concerns         Wheelchair     Assist Will patient use wheelchair at discharge?: Yes Type of Wheelchair: Manual    Wheelchair assist level: Supervision/Verbal cueing Max wheelchair distance: 250 ft    Wheelchair 50 feet with 2 turns activity    Assist        Assist Level: Supervision/Verbal cueing   Wheelchair 150 feet activity     Assist      Assist Level: Supervision/Verbal cueing   Blood pressure 135/81, pulse 74, temperature 98.6 F (37 C), resp. rate 18, height 5\' 7"  (1.702 m), weight 112.3 kg, SpO2 98 %.  Medical Problem List and Plan: 1.   Debility secondary to sepsis due to E. Coli/psoas abscess.S/Pwound debridement 7/19             -patient may not shower until drain removed             -ELOS/Goals: 12-16 days Supervision  -con't CIR/PT and OT- will determine length of stay next Tuesday.  2.  Antithrombotics: -DVT/anticoagulation: Lovenox             -antiplatelet therapy: N/A 3. Pain Management: Oxycodone as needed  8/12- pain controlled- con't regimen 4. Mood: Provide emotional support             -antipsychotic agents: N/A 5. Neuropsych: This patient is capable of making decisions on his own behalf. 6. Skin/Wound Care: Routine skin checks- will remove honeycomb in a few days.  7. Fluids/Electrolytes/Nutrition: Routine in and out with follow up chemestries 8.ID.  Continue Cefazolin 2 gram every 8 hours through 03/11/2021 per infectious disease. Will f/u surgery to see when needs to have drain removed.   8/11- drain they are thinking to remove 8/20, but keep good record of output and if does better/minimal drainage, will remove prior. WBC down to 11.7 from 12.9 and 13.8- will con't to monitor  8/12- per ID, needs CT of spine, Chest, abd and pelvis 1st week of September as well as IV ABX til 9/14.  9.   Left knee pseudogout.  Status post aspiration.  Continue colchicine  8/11- Cr 0.71 on colchicine- con't to monitor 10.  Acute on chronic anemia.  Follow-up CBC  8/11- HB 9.0- stable- con't regimen 11.  Hyponatremia.  Follow-up chemistries  12.Obesity.PYP95.09.Follow up dietery 13.  OSA.  BiPAP- pt says pressure too high and wears O2 instead.  14. Constipation 8/11- will give Sorbitol 60cc after therapy today- and monitor   8/12- will give Milk of Mag since Mg citrate not available- and follow with fleets enema in 3 hours if no results- stop eating bananas for now.       LOS: 3 days A FACE TO FACE EVALUATION WAS PERFORMED  Noretta Frier 02/06/2021, 8:51 AM

## 2021-02-06 NOTE — IPOC Note (Signed)
Overall Plan of Care [facility]) Patient Details Name: Marco Kennedy MRN: [id number] DOB: 1951-03-15  Admitting Diagnosis: Vertebral osteomyelitis [facility])  Hospital Problems: Principal Problem:   Vertebral osteomyelitis (HCC) Active Problems:   Obesity, Class III, BMI 40-49.9 (morbid obesity) (HCC)   Sepsis due to Escherichia coli (E. coli) (HCC)   Sepsis (HCC)   Abdominal fluid collection     Functional Problem List: Nursing Edema, Endurance, Medication Management, Pain, Safety, Skin Integrity  PT Endurance, Motor, Balance, Pain, Edema, Skin Integrity  OT Balance, Edema, Endurance, Motor, Pain, Safety  SLP    TR         Basic ADL's: OT Grooming, Bathing, Dressing, Toileting     Advanced  ADL's: OT       Transfers: PT Bed Mobility, Bed to Chair, Car, Occupational psychologist, Research scientist (life sciences): PT Ambulation, Psychologist, prison and probation services, Stairs     Additional Impairments: OT None  SLP        TR      Anticipated Outcomes Item Anticipated Outcome  Self Feeding no goal  Swallowing      Basic self-care  Min LB, Supervision transfers  Engineer, technical sales Transfers Supervision  Bowel/Bladder  n/a  Transfers  Supervision  Locomotion  Supervision short distance gait  Communication     Cognition     Pain  <4  Safety/Judgment  supervision and no falls   Therapy Plan: PT Intensity: Minimum of 1-2 x/day ,45 to 90 minutes PT Frequency: 5 out of 7 days PT Duration Estimated Length of Stay: 10-14 days OT Intensity: Minimum of 1-2 x/day, 45 to 90 minutes OT Frequency: 5 out of 7 days OT Duration/Estimated Length of Stay: 10-14 days     Due to the current state of emergency, patients may not be receiving their 3-hours of Medicare-mandated therapy.   Team Interventions: Nursing Interventions Patient/Family Education, Disease Management/Prevention, Pain Management, Medication Management, Skin Care/Wound Management, Discharge Planning  PT  interventions Ambulation/gait training, Balance/vestibular training, Community reintegration, Discharge planning, Disease management/prevention, DME/adaptive equipment instruction, Functional mobility training, Neuromuscular re-education, Pain management, Patient/family education, Psychosocial support, Skin care/wound management, Stair training, Therapeutic Activities, Therapeutic Exercise, UE/LE Strength taining/ROM, UE/LE Coordination activities, Wheelchair propulsion/positioning  OT Interventions Balance/vestibular training, Discharge planning, Pain management, Self Care/advanced ADL retraining, Therapeutic Activities, UE/LE Coordination activities, Visual/perceptual remediation/compensation, Therapeutic Exercise, Skin care/wound managment, Patient/family education, Functional mobility training, Disease mangement/prevention, Firefighter, Fish farm manager, Neuromuscular re-education, Psychosocial support, UE/LE Strength taining/ROM, Wheelchair propulsion/positioning  SLP Interventions    TR Interventions    SW/CM Interventions Discharge Planning, Psychosocial Support, Patient/Family Education   Barriers to Discharge MD  Medical stability, Home enviroment access/loayout, IV antibiotics, Wound care, Lack of/limited family support, Weight, and Weight bearing restrictions  Nursing Decreased caregiver support, Home environment access/layout, Wound Care, Lack of/limited family support, Weight, Weight bearing restrictions, Medication compliance, Behavior Lives with spouse in a home with level entry through the back entrance. Able to live on main level with bedroom/bathroom. Spouse able to provide supervision to min assist 24/7.  PT Inaccessible home environment, Decreased caregiver support, Home environment access/layout, Wound Care, Weight    OT      SLP      SW       Team Discharge Planning: Destination: PT-Home ,OT- Home , SLP-  Projected Follow-up: PT-Home health PT,  OT-  Home health OT, SLP-  Projected Equipment Needs: PT-To be determined, OT- To be determined, SLP-  Equipment Details: PT-Likely w/c, OT-  Patient/family involved in discharge planning: PT- Patient,  OT-Patient, SLP-   MD ELOS: 10-14 days Medical Rehab Prognosis:  Good Assessment: Pt is a 70 yr old male with Thoracic osteomyelitis and sepsis- on IV ABX til 9/14- ID is following- main issue is no BM for 4-5 days- very constipated- working on this today.   Goals supervision to min A    See Team Conference Notes for weekly updates to the plan of care

## 2021-02-07 DIAGNOSIS — K5901 Slow transit constipation: Secondary | ICD-10-CM

## 2021-02-07 DIAGNOSIS — D649 Anemia, unspecified: Secondary | ICD-10-CM

## 2021-02-07 DIAGNOSIS — K5900 Constipation, unspecified: Secondary | ICD-10-CM

## 2021-02-07 DIAGNOSIS — E871 Hypo-osmolality and hyponatremia: Secondary | ICD-10-CM

## 2021-02-07 NOTE — Progress Notes (Signed)
Pt states he may not use CPAP tonight, but is able to place on himself. Home CPAP unit within reach. RT will continue to monitor.

## 2021-02-07 NOTE — Progress Notes (Signed)
Physical Therapy Session Note  Patient Details  Name: Marco Kennedy MRN: [id number] Date of Birth: Nov 02, 1950  Today's Date: 02/07/2021 PT Individual Time:  -      Short Term Goals: Week 1:  PT Short Term Goal 1 (Week 1): Pt will navigate 3" stairs x 8 PT Short Term Goal 2 (Week 1): Pt will ambulate 200 ft with LRAD PT Short Term Goal 3 (Week 1): Pt will perform x 10 min exercise to improve functional endurance.  Skilled Therapeutic Interventions/Progress Updates:    Session 1: Pt received in recliner and agreeable to therapy.  No complaint of pain. Sit to stand with RW and CGA-min A throughout sessGait x 60 ft with RW and CGA to therapy gym. Stand pivot transfer with CGA and RW to mat table.   Pt participated in knee drives  3 x 10 BIL with RW and CGA. During last set, pt's L knee buckled without warning. Pt had assisted fall as result of knee buckle, mildly scraping back on mat table. Therapist assessed pt for injury, with no obvious findings and pt reporting no pain. Dependent transfer to mat table with assist from nursing staff x 4. No obvious sign of knee injury with palpation and AROM, pt reported only familiar arthritic pain with palpation. BP immediately after fall=152/107, dropped to 132/87 as pt rested in sitting on mat table.   Pt opted to continue with session. Gait x 40 ft with RW heavy CGA d/t recent fall. Pt reported some weakness in his knee, but no increase pain or feelings of instability. Pt then performed 8 min on nu step at level 4 and ~70 spm, Reported RPE of 14/20. ambulatory transfer nustep>w/c>recliner with RW and CGA. Pt remained in recliner after session with all needs in reach and alarm active.   Session 2: Pt received in recliner and agreeable to therapy.  No complaint of pain. Pt inquired about knee braces to prevent buckling in the future, states he has a compression brace he has used in the past. Therapist advised that it could be helpful, but results vary  between individuals. Advised pt to bring in the one he has and try it during therapies before purchasing anything new. Also discussed pt's HR, which was elevated at 108 while resting in the chair. Per chart, resting rate is typically 85-90, but was also elevated when staff was in the room for IV and drain management. Discussed possible causes and will continue to monitor.  Assisted pt with changing shorts d/t paper shorts ripping. Assist with threading d/t shoes, Sit to stand with min and CGA to pull pants over hips. Pt then requested to return to bed at end of session.  ambulatory transfer with RW and CGA, sit>supine with mod A to lift legs. Pt able to assist with scooting up in bed. Pt was left with all needs in reach and alarm active.   Therapy Documentation Precautions:  Precautions Precautions: Fall Precaution Comments: monitor SpO2; L drain Restrictions Weight Bearing Restrictions: No     Therapy/Group: Individual Therapy  [name] 02/07/2021, 3:10 PM

## 2021-02-07 NOTE — Progress Notes (Signed)
   02/07/21 1135  What Happened  Was fall witnessed? Yes  Who witnessed fall? Zollie Scale PT  Patients activity before fall during therapy  Point of contact buttocks  Was patient injured? No  Follow Up  MD notified Dr. Allena Katz  Time MD notified 1125  Family notified Yes - comment (Kathy/ wife)  Time family notified 1130  Additional tests No  Simple treatment Other (comment)  Adult Fall Risk Assessment  Risk Factor Category (scoring not indicated) Fall has occurred during this admission (document High fall risk)  Age 70  Fall History: Fall within 6 months prior to admission 5  Elimination; Bowel and/or Urine Incontinence 0  Elimination; Bowel and/or Urine Urgency/Frequency 0  Medications: includes PCA/Opiates, Anti-convulsants, Anti-hypertensives, Diuretics, Hypnotics, Laxatives, Sedatives, and Psychotropics 5  Patient Care Equipment 1  Mobility-Assistance 2  Mobility-Gait 2  Mobility-Sensory Deficit 0  Altered awareness of immediate physical environment 0  Impulsiveness 0  Lack of understanding of one's physical/cognitive limitations 0  Total Score 17  Patient Fall Risk Level High fall risk  Adult Fall Risk Interventions  Required Bundle Interventions *See Row Information* High fall risk - low, moderate, and high requirements implemented  Additional Interventions Lap belt while in chair/wheelchair (Rehab only);Room near nurses station  Screening for Fall Injury Risk (To be completed on HIGH fall risk patients) - Assessing Need for Floor Mats  Risk For Fall Injury- Criteria for Floor Mats None identified - No additional interventions needed  Vitals  Temp 98 F (36.7 C)  Temp Source Oral  BP 131/87  BP Location Left Arm  BP Method Automatic  Patient Position (if appropriate) Sitting  Pulse Rate 88 (auscultation)  Pulse Rate Source Apical  Resp 19  Oxygen Therapy  SpO2 98 %  O2 Device Room Air  Pain Assessment  Pain Scale 0-10  Pain Score 0  Pain Type Acute pain   PCA/Epidural/Spinal Assessment  Respiratory Pattern Regular;Unlabored  Neurological  Neuro (WDL) WDL  Level of Consciousness Alert  Orientation Level Oriented X4  Cognition Appropriate at baseline  Speech Clear  Pupil Assessment  No  R Hand Grip Present;Strong  L Hand Grip Present;Strong  RUE Motor Response Purposeful movement  RUE Sensation Full sensation  RUE Motor Strength 5  LUE Motor Response Purposeful movement  LUE Sensation Full sensation  LUE Motor Strength 5  RLE Motor Response Purposeful movement  RLE Sensation Full sensation  RLE Motor Strength 4  LLE Motor Response Purposeful movement  LLE Sensation Full sensation  LLE Motor Strength 4  Neuro Symptoms None  Neuro symptoms relieved by Rest  Musculoskeletal  Musculoskeletal (WDL) X  Assistive Device Wheelchair  Generalized Weakness Yes  Weight Bearing Restrictions No  Musculoskeletal Details  RUE Full movement  LUE Full movement  RLE Full movement  LLE Full movement  Right Knee Weakness  Integumentary  Integumentary (WDL) X  Skin Color Appropriate for ethnicity  Skin Condition Dry  Skin Integrity Surgical Incision (see LDA)  Catheter Entry/Exit Location Abdomen  Catheter Entry/Exit Location Orientation Left  Catheter Entry/Exit Intervention Other (Comment)

## 2021-02-07 NOTE — Progress Notes (Signed)
PROGRESS NOTE   Subjective/Complaints: Patient seen sitting up in a chair this morning.  He states he slept well overnight.  He states he had a good bowel movement this morning.  Patient was seen by [facility]IR yesterday, notes reviewed-imaging when output less than 10 mL for 24 hours.  ROS: Denies CP, SOB, N/V/D  Objective:   No results found. Recent Labs    02/06/21 0506  WBC 11.9*  HGB 8.8*  HCT 28.7*  PLT 435*    Recent Labs    02/06/21 0506  NA 133*  K 4.0  CL 91*  CO2 33*  GLUCOSE 124*  BUN 12  CREATININE 0.67  CALCIUM 8.5*     Intake/Output Summary (Last 24 hours) at 02/07/2021 1229 Last data filed at 02/07/2021 9833 Gross per 24 hour  Intake 780 ml  Output 2430 ml  Net -1650 ml         Physical Exam: Vital Signs Blood pressure 131/87, pulse 88, temperature 98 F (36.7 C), temperature source Oral, resp. rate 19, height 5\' 7"  (1.702 m), weight 113.6 kg, SpO2 98 %. Constitutional: No distress . Vital signs reviewed. HENT: Normocephalic.  Atraumatic. Eyes: EOMI. No discharge. Cardiovascular: No JVD.  RRR. Respiratory: Normal effort.  No stridor.  Bilateral clear to auscultation. GI: Non-distended.  BS +. Skin: Warm and dry.  Intact right knee with dressing CDI Psych: Normal mood.  Normal behavior. Musc: No edema in extremities.  No tenderness in extremities. Neuro: Alert and oriented motor: Bilateral upper extremities: 5/5 proximal distal Right lower extremity: Hip flexion, knee extension, ankle dorsiflexion 2/5 Left lower extremity: 5 cm knee extension, ankle dorsiflexion to -10/5    Assessment/Plan: 1. Functional deficits which require 3+ hours per day of interdisciplinary therapy in a comprehensive inpatient rehab setting. Physiatrist is providing close team supervision and 24 hour management of active medical problems listed below. Physiatrist and rehab team continue to assess barriers to  discharge/monitor patient progress toward functional and medical goals  Care Tool:  Bathing    Body parts bathed by patient: Right arm, Left arm, Chest, Abdomen, Front perineal area, Right upper leg, Left upper leg, Face   Body parts bathed by helper: Buttocks, Right lower leg, Left lower leg     Bathing assist Assist Level: Moderate Assistance - Patient 50 - 74%     Upper Body Dressing/Undressing Upper body dressing   What is the patient wearing?: Pull over shirt    Upper body assist Assist Level: Set up assist    Lower Body Dressing/Undressing Lower body dressing      What is the patient wearing?: Pants     Lower body assist Assist for lower body dressing: Total Assistance - Patient < 25%     Toileting Toileting    Toileting assist Assist for toileting: Maximal Assistance - Patient 25 - 49% Assistive Device Comment:  (urinal)   Transfers Chair/bed transfer  Transfers assist     Chair/bed transfer assist level: Minimal Assistance - Patient > 75%     Locomotion Ambulation   Ambulation assist      Assist level: Minimal Assistance - Patient > 75% Assistive device: Walker-rolling Max distance: 50 ft  Walk 10 feet activity   Assist     Assist level: Minimal Assistance - Patient > 75% Assistive device: Walker-rolling   Walk 50 feet activity   Assist    Assist level: Minimal Assistance - Patient > 75% Assistive device: Walker-rolling    Walk 150 feet activity   Assist Walk 150 feet activity did not occur: Safety/medical concerns         Walk 10 feet on uneven surface  activity   Assist Walk 10 feet on uneven surfaces activity did not occur: Safety/medical concerns         Wheelchair     Assist Will patient use wheelchair at discharge?: Yes Type of Wheelchair: Manual    Wheelchair assist level: Supervision/Verbal cueing Max wheelchair distance: 250 ft    Wheelchair 50 feet with 2 turns activity    Assist         Assist Level: Supervision/Verbal cueing   Wheelchair 150 feet activity     Assist      Assist Level: Supervision/Verbal cueing   Blood pressure 131/87, pulse 88, temperature 98 F (36.7 C), temperature source Oral, resp. rate 19, height 5\' 7"  (1.702 m), weight 113.6 kg, SpO2 98 %.  Medical Problem List and Plan: 1.   Debility secondary to sepsis due to E. Coli/psoas abscess.S/Pwound debridement 7/19  Continue CIR 2.  Antithrombotics: -DVT/anticoagulation: Lovenox             -antiplatelet therapy: N/A 3. Pain Management: Oxycodone as needed  Controlled with meds on 8/13 4. Mood: Provide emotional support             -antipsychotic agents: N/A 5. Neuropsych: This patient is capable of making decisions on his own behalf. 6. Skin/Wound Care: Routine skin checks- remove honeycomb in a few days.  7. Fluids/Electrolytes/Nutrition: Routine in and out 8. ID. Continue Cefazolin 2 gram every 8 hours through 03/11/2021 per infectious disease.   Drains to be removed?  8/20  8/12- per ID, needs CT of spine, Chest, abd and pelvis 1st week of September as well as IV antibiotics til 9/14.  9.  Left knee pseudogout.  Status post aspiration.  Continue colchicine  8/11- Cr 0.71 on colchicine- con't to monitor 10.  Acute on chronic anemia.  Follow-up CBC  Hemoglobin 8.8 on 8/12  Continue to monitor 11. Hyponatremia.    Sodium 133 on 8/12 Continue to monitor 12. Obesity.10/12.Follow up dietery 13.  OSA.  BiPAP- pt says pressure too high and wears O2 instead.  14. Constipation Good BM on 8/13 15.  Leukocytosis  WBCs 11.9 on 8/12  See #8  Afebrile, continue monitor for signs and symptoms of infection  LOS: 4 days A FACE TO FACE EVALUATION WAS PERFORMED  Izeah Vossler 10/12 02/07/2021, 12:29 PM

## 2021-02-07 NOTE — Progress Notes (Signed)
Occupational Therapy Session Note  Patient Details  Name: Marco Kennedy MRN: [id number] Date of Birth: [birth year]  Today's Date: 02/08/2021 OT Group Time: 1105-1205 OT Group Time Calculation (min): 60 min  Skilled Therapeutic Interventions/Progress Updates:    Pt engaged in therapeutic w/c level dance group focusing on patient choice, UE/LE strengthening, salience, activity tolerance, and social participation. Pt was guided through various dance-based exercises involving UEs/LEs and trunk. All music was selected by group members. Emphasis placed on activity tolerance and general strengthening. Pt with high levels of participation at seated level, took rest breaks as needed. He also interacted socially with others and appeared to be enjoying himself. RT escorted pt back to the room at close of session.    Therapy Documentation Precautions:  Precautions Precautions: Fall Precaution Comments: monitor SpO2; L drain Restrictions Weight Bearing Restrictions: No  Pain: no s/s pain during tx Pain Assessment Pain Scale: 0-10 Pain Score: 3  Pain Type: Acute pain Pain Location: Back Pain Orientation: Lower ADL: ADL Eating: Not assessed Grooming: Supervision/safety Upper Body Bathing: Supervision/safety Where Assessed-Upper Body Bathing: Sitting at sink Lower Body Bathing: Maximal assistance Upper Body Dressing: Minimal assistance Lower Body Dressing: Dependent Toileting: Not assessed Toilet Transfer: Moderate assistance Toilet Transfer Method: Stand pivot Tub/Shower Transfer: Not assessed      Therapy/Group: Group Therapy  Marco Kennedy A Marco Kennedy 02/08/2021, 12:50 PM

## 2021-02-08 DIAGNOSIS — R188 Other ascites: Secondary | ICD-10-CM

## 2021-02-08 DIAGNOSIS — R7303 Prediabetes: Secondary | ICD-10-CM

## 2021-02-08 NOTE — Plan of Care (Signed)
  Problem: RH SAFETY Goal: RH STG DECREASED RISK OF FALL WITH ASSISTANCE Description: STG Decreased Risk of Fall With Cues and Reminders. Outcome: Progressing   Problem: RH SKIN INTEGRITY Goal: RH STG MAINTAIN SKIN INTEGRITY WITH ASSISTANCE Description: STG Maintain Skin Integrity With Supervision Assistance. Outcome: Progressing   Problem: RH SAFETY Goal: RH STG ADHERE TO SAFETY PRECAUTIONS W/ASSISTANCE/DEVICE Description: STG Adhere to Safety Precautions With Supervision Assistance/Device. Outcome: Progressing

## 2021-02-08 NOTE — Progress Notes (Addendum)
PROGRESS NOTE   Subjective/Complaints: Patient seen sitting up in bed this morning.  He states he slept well overnight.  He denies complaints.  He notes he had a fall yesterday, assisted.  Discussed with nursing as well.  No trauma reported.  ROS: Denies CP, SOB, N/V/D  Objective:   No results found. Recent Labs    02/06/21 0506  WBC 11.9*  HGB 8.8*  HCT 28.7*  PLT 435*    Recent Labs    02/06/21 0506  NA 133*  K 4.0  CL 91*  CO2 33*  GLUCOSE 124*  BUN 12  CREATININE 0.67  CALCIUM 8.5*     Intake/Output Summary (Last 24 hours) at 02/08/2021 0854 Last data filed at 02/08/2021 0807 Gross per 24 hour  Intake 1010 ml  Output 1720 ml  Net -710 ml         Physical Exam: Vital Signs Blood pressure (!) 150/81, pulse 86, temperature 98.6 F (37 C), temperature source Oral, resp. rate 16, height 5\' 7"  (1.702 m), weight 110.4 kg, SpO2 95 %. Constitutional: No distress . Vital signs reviewed. HENT: Normocephalic.  Atraumatic. Eyes: EOMI. No discharge. Cardiovascular: No JVD.  RRR. Respiratory: Normal effort.  No stridor.  Bilateral clear to auscultation. GI: Non-distended.  BS +. Skin: Warm and dry.  Intact.  Right knee with dressing CDI. Psych: Normal mood.  Normal behavior. Musc: No extreme edema.  No tenderness in extremities. Neuro: Alert and oriented  Motor: Bilateral upper extremities: 5/5 proximal distal Right lower extremity: Hip flexion, knee extension 3/5, ankle dorsiflexion 2/5 Left lower extremity: Hip flexion, knee extension 3/5, ankle dorsiflexion 2-/5    Assessment/Plan: 1. Functional deficits which require 3+ hours per day of interdisciplinary therapy in a comprehensive inpatient rehab setting. Physiatrist is providing close team supervision and 24 hour management of active medical problems listed below. Physiatrist and rehab team continue to assess barriers to discharge/monitor patient  progress toward functional and medical goals  Care Tool:  Bathing    Body parts bathed by patient: Right arm, Left arm, Chest, Abdomen, Front perineal area, Right upper leg, Left upper leg, Face   Body parts bathed by helper: Buttocks, Right lower leg, Left lower leg     Bathing assist Assist Level: Moderate Assistance - Patient 50 - 74%     Upper Body Dressing/Undressing Upper body dressing   What is the patient wearing?: Pull over shirt    Upper body assist Assist Level: Set up assist    Lower Body Dressing/Undressing Lower body dressing      What is the patient wearing?: Pants     Lower body assist Assist for lower body dressing: Total Assistance - Patient < 25%     Toileting Toileting    Toileting assist Assist for toileting: Maximal Assistance - Patient 25 - 49% Assistive Device Comment:  (urinal)   Transfers Chair/bed transfer  Transfers assist     Chair/bed transfer assist level: Minimal Assistance - Patient > 75%     Locomotion Ambulation   Ambulation assist      Assist level: Minimal Assistance - Patient > 75% Assistive device: Walker-rolling Max distance: 50 ft   Walk 10  feet activity   Assist     Assist level: Minimal Assistance - Patient > 75% Assistive device: Walker-rolling   Walk 50 feet activity   Assist    Assist level: Minimal Assistance - Patient > 75% Assistive device: Walker-rolling    Walk 150 feet activity   Assist Walk 150 feet activity did not occur: Safety/medical concerns         Walk 10 feet on uneven surface  activity   Assist Walk 10 feet on uneven surfaces activity did not occur: Safety/medical concerns         Wheelchair     Assist Will patient use wheelchair at discharge?: Yes Type of Wheelchair: Manual    Wheelchair assist level: Supervision/Verbal cueing Max wheelchair distance: 250 ft    Wheelchair 50 feet with 2 turns activity    Assist        Assist Level:  Supervision/Verbal cueing   Wheelchair 150 feet activity     Assist      Assist Level: Supervision/Verbal cueing   Blood pressure (!) 150/81, pulse 86, temperature 98.6 F (37 C), temperature source Oral, resp. rate 16, height 5\' 7"  (1.702 m), weight 110.4 kg, SpO2 95 %.  Medical Problem List and Plan: 1.   Debility secondary to sepsis due to E. Coli/psoas abscess.S/Pwound debridement 7/19  Continue CIR 2.  Antithrombotics: -DVT/anticoagulation: Lovenox             -antiplatelet therapy: N/A 3. Pain Management: Oxycodone as needed  Controlled with meds on 8/14 4. Mood: Provide emotional support             -antipsychotic agents: N/A 5. Neuropsych: This patient is capable of making decisions on his own behalf. 6. Skin/Wound Care: Routine skin checks- remove honeycomb in a few days.  7. Fluids/Electrolytes/Nutrition: Routine in and out 8. ID. Continue Cefazolin 2 gram every 8 hours through 03/11/2021 per infectious disease.   Drains to be removed?  8/20  8/12- per ID, needs CT of spine, Chest, abd and pelvis 1st week of September as well as IV antibiotics til 9/14.  9.  Left knee pseudogout.  Status post aspiration.  Continue colchicine 10.  Acute on chronic anemia.  Follow-up CBC  Hemoglobin 8.8 on 8/12  Continue to monitor 11. Hyponatremia.    Sodium 133 on 8/12 Continue to monitor 12.  Morbid obesity:.10/12.Follow up dietery.  Encourage weight loss 13.  OSA.  BiPAP- pt says pressure too high and wears O2 instead.  14. Constipation Good BM on 8/13 15.  Leukocytosis  WBCs 11.9 on 8/12  See #8  Afebrile, continue monitor for signs and symptoms of infection 16.  Prediabetes  Monitor with increased mobility  LOS: 5 days A FACE TO FACE EVALUATION WAS PERFORMED  Niaja Stickley 10/12 02/08/2021, 8:54 AM

## 2021-02-09 NOTE — Progress Notes (Signed)
PROGRESS NOTE   Subjective/Complaints:  Pt reports had an assisted fall this weekend- notes that L knee gave way. Had 1-2 BM's this weekend-   ROS:  Pt denies SOB, abd pain, CP, N/V/C/D, and vision changes   Objective:   No results found. No results for input(s): WBC, HGB, HCT, PLT in the last 72 hours. No results for input(s): NA, K, CL, CO2, GLUCOSE, BUN, CREATININE, CALCIUM in the last 72 hours.  Intake/Output Summary (Last 24 hours) at 02/09/2021 0929 Last data filed at 02/09/2021 9381 Gross per 24 hour  Intake 1235 ml  Output 1800 ml  Net -565 ml        Physical Exam: Vital Signs Blood pressure (!) 154/80, pulse 81, temperature 98.5 F (36.9 C), temperature source Oral, resp. rate 18, height 5\' 7"  (1.702 m), weight 110.4 kg, SpO2 96 %.   General: awake, alert, appropriate, sitting up in bed; just woke up; NAD HENT: conjugate gaze; oropharynx moist CV: regular rate; no JVD Pulmonary: CTA B/L; no W/R/R- good air movement GI:  softer; less distended, protuberant; normoactive BS Psychiatric: appropriate Neurological: alert  Skin: Warm and dry.  Intact.  Right knee with dressing CDI. Back incision with dressing- is actually healed- d/c'd dressing.  Psych: Normal mood.  Normal behavior. Musc: No extreme edema.  No tenderness in extremities. Neuro: Alert and oriented  Motor: Bilateral upper extremities: 5/5 proximal distal Right lower extremity: Hip flexion, knee extension 3/5, ankle dorsiflexion 2/5 Left lower extremity: Hip flexion, knee extension 3/5, ankle dorsiflexion 2-/5    Assessment/Plan: 1. Functional deficits which require 3+ hours per day of interdisciplinary therapy in a comprehensive inpatient rehab setting. Physiatrist is providing close team supervision and 24 hour management of active medical problems listed below. Physiatrist and rehab team continue to assess barriers to discharge/monitor  patient progress toward functional and medical goals  Care Tool:  Bathing    Body parts bathed by patient: Right arm, Left arm, Chest, Abdomen, Front perineal area, Right upper leg, Left upper leg, Face   Body parts bathed by helper: Buttocks, Right lower leg, Left lower leg     Bathing assist Assist Level: Moderate Assistance - Patient 50 - 74%     Upper Body Dressing/Undressing Upper body dressing   What is the patient wearing?: Pull over shirt    Upper body assist Assist Level: Set up assist    Lower Body Dressing/Undressing Lower body dressing      What is the patient wearing?: Pants     Lower body assist Assist for lower body dressing: Total Assistance - Patient < 25%     Toileting Toileting    Toileting assist Assist for toileting: Maximal Assistance - Patient 25 - 49% Assistive Device Comment:  (urinal)   Transfers Chair/bed transfer  Transfers assist     Chair/bed transfer assist level: Minimal Assistance - Patient > 75%     Locomotion Ambulation   Ambulation assist      Assist level: Minimal Assistance - Patient > 75% Assistive device: Walker-rolling Max distance: 50 ft   Walk 10 feet activity   Assist     Assist level: Minimal Assistance - Patient > 75%  Assistive device: Walker-rolling   Walk 50 feet activity   Assist    Assist level: Minimal Assistance - Patient > 75% Assistive device: Walker-rolling    Walk 150 feet activity   Assist Walk 150 feet activity did not occur: Safety/medical concerns         Walk 10 feet on uneven surface  activity   Assist Walk 10 feet on uneven surfaces activity did not occur: Safety/medical concerns         Wheelchair     Assist Will patient use wheelchair at discharge?: Yes Type of Wheelchair: Manual    Wheelchair assist level: Supervision/Verbal cueing Max wheelchair distance: 250 ft    Wheelchair 50 feet with 2 turns activity    Assist        Assist Level:  Supervision/Verbal cueing   Wheelchair 150 feet activity     Assist      Assist Level: Supervision/Verbal cueing   Blood pressure (!) 154/80, pulse 81, temperature 98.5 F (36.9 C), temperature source Oral, resp. rate 18, height 5\' 7"  (1.702 m), weight 110.4 kg, SpO2 96 %.  Medical Problem List and Plan: 1.   Debility secondary to sepsis due to E. Coli/psoas abscess.S/Pwound debridement 7/19  Continue CIR 2.  Antithrombotics: -DVT/anticoagulation: Lovenox             -antiplatelet therapy: N/A 3. Pain Management: Oxycodone as needed  8/15- pain controlled with meds prn- con't regimen 4. Mood: Provide emotional support             -antipsychotic agents: N/A 5. Neuropsych: This patient is capable of making decisions on his own behalf. 6. Skin/Wound Care: Routine skin checks- remove honeycomb in a few days.  7. Fluids/Electrolytes/Nutrition: Routine in and out 8. ID. Continue Cefazolin 2 gram every 8 hours through 03/11/2021 per infectious disease.   Drains to be removed?  8/20  8/12- per ID, needs CT of spine, Chest, abd and pelvis 1st week of September as well as IV antibiotics til 9/14.   8/15- less than 5cc in bag- said was changed overnight but less fluid in it- con't to monitor; CRP down to 10.4- will monitor weekly.  9.  Left knee pseudogout.  Status post aspiration.  Continue colchicine 10.  Acute on chronic anemia.  Follow-up CBC  Hemoglobin 8.8 on 8/12  Continue to monitor 11. Hyponatremia.    Sodium 133 on 8/12 Continue to monitor 12.  Morbid obesity:.10/12.Follow up dietery.  Encourage weight loss 13.  OSA.  BiPAP- pt says pressure too high and wears O2 instead.  14. Constipation Good BM on 8/13 15.  Leukocytosis  WBCs 11.9 on 8/12  See #8  Afebrile, continue monitor for signs and symptoms of infection 16.  Prediabetes  Monitor with increased mobility  8/15- last BG was 124 on BMP on 8/12- con't to monitor  LOS: 6 days A FACE TO FACE EVALUATION WAS  PERFORMED  Glen Kesinger 02/09/2021, 9:29 AM

## 2021-02-09 NOTE — Progress Notes (Signed)
Physical Therapy Session Note  Patient Details  Name: Marco Kennedy MRN: [id number] Date of Birth: 05-05-1951  Today's Date: 02/09/2021 PT Individual Time: 1400-1447 PT Individual Time Calculation (min): 47 min   Short Term Goals: Week 1:  PT Short Term Goal 1 (Week 1): Pt will navigate 3" stairs x 8 PT Short Term Goal 2 (Week 1): Pt will ambulate 200 ft with LRAD PT Short Term Goal 3 (Week 1): Pt will perform x 10 min exercise to improve functional endurance.  Skilled Therapeutic Interventions/Progress Updates:    Pt received in recliner and agreeable to therapy. No complaint of pain. ambulatory transfer to w/c with RW and CGA.   Gait training 2 x 200 ft with RW and CGA. Pt demoes kyphotic posture and decr step length. Reports feeling winded and required extended rest break between bouts. Pt then performed 3 x 10 step taps on 1.5" step with LLE to build strength and endurance for stair navigation.   Pt then ambulated back to room, ~75 ft in same manner. Pt opted to remain seated in w/c d/t pending OT session. Pt was left with all needs in reach and alarm active.   Therapy Documentation Precautions:  Precautions Precautions: Fall Precaution Comments: monitor SpO2; L drain Restrictions Weight Bearing Restrictions: No     Therapy/Group: Individual Therapy  [name] 02/09/2021, 4:05 PM

## 2021-02-09 NOTE — Progress Notes (Addendum)
Referring Physician(s): Dr. West Bali, S.   Supervising Physician: Arne Cleveland  Patient Status:  [facility] - In-pt  Chief Complaint: Left retroperitoneal fluid collection  Subjective: Patient just finished lunch.  Reports he does not feel his appetite has improved.  No complaints r/t to RP drain.   Allergies: Patient has no known allergies.  Medications: Prior to Admission medications   Medication Sig Start Date End Date Taking? Authorizing Provider  ceFAZolin (ANCEF) IVPB Inject 2 g into the vein every 8 (eight) hours. Indication:  vertebral osteomyelitis First Dose: No Last Day of Therapy:  02/24/2021 Labs - Once weekly:  CBC/D and BMP, Labs - Every other week:  ESR and CRP Method of administration: IV Push Method of administration may be changed at the discretion of home infusion pharmacist based upon assessment of the patient and/or caregiver's ability to self-administer the medication ordered. 01/15/21 02/24/21  Marvis Moeller, NP  cholecalciferol (VITAMIN D3) 25 MCG (1000 UNIT) tablet Take 1,000 Units by mouth daily.    [provider]  diclofenac (VOLTAREN) 75 MG EC tablet Take 75 mg by mouth 2 (two) times daily. 11/13/20   [provider]  Multiple Vitamin (MULTIVITAMIN WITH MINERALS) TABS tablet Take 1 tablet by mouth daily.    [provider]  oxyCODONE (OXY IR/ROXICODONE) 5 MG immediate release tablet Take 1 tablet (5 mg total) by mouth every 6 (six) hours as needed for moderate pain. 01/15/21   Little Ishikawa, MD  polyethylene glycol (MIRALAX / GLYCOLAX) 17 g packet Take 17 g by mouth 2 (two) times daily. 01/15/21   Little Ishikawa, MD     Vital Signs: BP (!) 154/80 (BP Location: Left Arm)   Pulse 81   Temp 98.5 F (36.9 C) (Oral)   Resp 18   Ht 5' 7"  (1.702 m)   Wt 243 lb 6.2 oz (110.4 kg)   SpO2 96%   BMI 38.12 kg/m   Physical Exam Vitals and nursing note reviewed.  Abdomen:  L RP drain to a gravity bag with clear,  yellow output. Site  clean, dry, and intact.    Imaging: No results found.  Labs:  CBC: Recent Labs    01/31/21 0500 02/01/21 0152 02/04/21 0425 02/06/21 0506  WBC 13.8* 12.9* 11.7* 11.9*  HGB 8.7* 9.0* 9.0* 8.8*  HCT 27.5* 28.4* 28.7* 28.7*  PLT 463* 499* 489* 435*     COAGS: Recent Labs    01/31/21 0500  INR 1.3*     BMP: Recent Labs    01/31/21 0500 02/01/21 0152 02/04/21 0425 02/06/21 0506  NA 132* 132* 132* 133*  K 4.0 4.2 4.1 4.0  CL 91* 92* 91* 91*  CO2 35* 34* 33* 33*  GLUCOSE 147* 140* 132* 124*  BUN 17 16 14 12   CALCIUM 7.7* 7.9* 8.1* 8.5*  CREATININE 0.76 0.80 0.71 0.67  GFRNONAA >60 >60 >60 >60     LIVER FUNCTION TESTS: Recent Labs    01/31/21 0500 02/01/21 0152 02/04/21 0425 02/06/21 0506  BILITOT 0.6 0.7 0.4 0.3  AST 79* 73* 49* 47*  ALT 36 34 27 22  ALKPHOS 115 107 90 91  PROT 4.9* 5.0* 5.4* 5.5*  ALBUMIN 1.4* 1.5* 1.7* 1.8*     Assessment and Plan: Left retroperitoneal fluid collection, patient is s/p aspiration and drain placement with IR on 01/27/2021.  Pt stable, drain intact. Output remains increased-- 200 mL documented yesterday.  Culture was negative 8/7.  WBC stable.   Continue with  flushing qd with 5-10 mL NS, record output q shift and dressing changes as needed. Would consider additional imaging when output is less than 10 ml for 24 hours not including flush material.     Electronically Signed: Docia Barrier, PA 02/09/2021, 2:39 PM   I spent a total of 15 Minutes at the the patient's bedside AND on the patient's hospital floor or unit, greater than 50% of which was counseling/coordinating care for retroperitoneal collection.

## 2021-02-09 NOTE — Progress Notes (Signed)
RT note: Patient wears home CPAP will place himself on tonight.

## 2021-02-09 NOTE — Progress Notes (Signed)
Occupational Therapy Session Note  Patient Details  Name: Marco Kennedy MRN: [id number] Date of Birth: [birth year]  Today's Date: 02/09/2021 OT Individual Time: 3500-9381 and 1455 1525 OT Individual Time Calculation (min): 73 min and 30 mins Missed 15 mins d/t previous pt care   Short Term Goals: Week 1:  OT Short Term Goal 1 (Week 1): Pt will complete BSC/toilet transfers CGA with LRAD OT Short Term Goal 2 (Week 1): Pt will complete ADL/activity of choice for 10 mins with minimal fatigue OT Short Term Goal 3 (Week 1): Pt will complete LB dress with AE PRN no more than MOD A   Skilled Therapeutic Interventions/Progress Updates:    Pt greeted at time of session semireclined in bed agreeable to OT session and no pain reported. Discussion with pt at beginning of session regarding L knee instability and importance of strengthening in future sessions. Pt needing to toilet, supine > sit Supervision with bed rail and ambulatory transfer bed > bathroom CGA with RW and no buckling today, heavy reliance on BUEs. Extended time on commode and able to pass gas but no BM. Stand pivot > wheelchair CGA with RW and self propel to sink. Focused on ADL tasks at sink level for shaving with set up, UB/LB bathing at sink level with Min A for buttocks only in standing and able to use LHS for feet simulated.  UB dress Set up, LB dress Mod A able to assist with threading and able to stand up to don over hips. Ambulated approx 5 feet over to recliner and set up with alarm on call bell in reach.   Session 2: Pt greeted at time of session sitting up in wheelchair no reports of pain agreeable to OT session. NT entered at this time checking vitals first, and once completed wheelchair transport to gym and focused on dynamic standing balance with unilateral support and reaching/bending and crossing midline to improve ability to stand and perform LB ADLs. 3 rounds at [facility] of the following: first round dots only for 2 minutes in  standing w/ 90% accuracy, second round with 3 colors in rotating pattern in <1 minute, and 20/20 words for approx 1:30 in standing with 90% accuracy. Transported back to room and stand pivot to recliner CGA. Alarm on call bell in reach.    Therapy Documentation Precautions:  Precautions Precautions: Fall Precaution Comments: monitor SpO2; L drain Restrictions Weight Bearing Restrictions: No     Therapy/Group: Individual Therapy  Erasmo Score 02/09/2021, 7:22 AM

## 2021-02-10 NOTE — Progress Notes (Signed)
Occupational Therapy Session Note  Patient Details  Name: Marco Kennedy MRN: [id number] Date of Birth: 1951/05/07  Today's Date: 02/10/2021 OT Individual Time: 4401-0272 and 5366-4403 OT Individual Time Calculation (min): 57 min and 43 min   Short Term Goals: Week 1:  OT Short Term Goal 1 (Week 1): Pt will complete BSC/toilet transfers CGA with LRAD OT Short Term Goal 2 (Week 1): Pt will complete ADL/activity of choice for 10 mins with minimal fatigue OT Short Term Goal 3 (Week 1): Pt will complete LB dress with AE PRN no more than MOD A   Skilled Therapeutic Interventions/Progress Updates:    Pt greeted at time of session semireclined in bed resting with MD finishing rounds. Pt with mild back pain 2/2 reported stiffness from being in bed, improved with positional change and movement. Pt wanting to get OOB and attempt to toilet, supine > sit Supervision with log roll for comfort and ambulated to bathroom CGA with RW. Clothing management with CGA as well, pt passing gas and very small BM. Propped up feet as well on trashcan for better alignment. IV team entered at this time as well to disconnect PICC. Pt stood and attempted posterior hygiene but needed therapist assist for fully cleaning before ambulating to sink level. Bathing with Min A overall for buttocks only, UB dress set up and LB dress Mod to thread and doned over hips sit <> stand. Set up in recliner alarm on call bell in reach.   Session 2: Pt greeted at time of session sitting up in recliner agreeable to OT session, no pain reported throughout. Pt initially agreeable to wash hair at sink level, but sink noted to be clogged and nursing notified and maintenance came to room while doing stretches later in session, fixed and will plan to wash hair tomorrow. Focus on the following stretches per pt request: attempted figure four each LE, unable to fully obtain d/t tightness, calf raises in standing and stretches seated with PROM DF stretch,  hip flexion while preventing bending back, all stretched with passive hold if able. Up in chair with alarm on call bell in reach. Discussion also throughout session regarding DC planning and DC date, pt thinks he will be ready by 22nd.   Therapy Documentation Precautions:  Precautions Precautions: Fall Precaution Comments: monitor SpO2; L drain Restrictions Weight Bearing Restrictions: No     Therapy/Group: Individual Therapy  Erasmo Score 02/10/2021, 7:10 AM

## 2021-02-10 NOTE — Progress Notes (Signed)
Physical Therapy Session Note  Patient Details  Name: Marco Kennedy MRN: [id number] Date of Birth: April 17, 1951  Today's Date: 02/10/2021 PT Individual Time: 1100-1200 PT Individual Time Calculation (min): 60 min   Short Term Goals: Week 1:  PT Short Term Goal 1 (Week 1): Pt will navigate 3" stairs x 8 PT Short Term Goal 2 (Week 1): Pt will ambulate 200 ft with LRAD PT Short Term Goal 3 (Week 1): Pt will perform x 10 min exercise to improve functional endurance.  Skilled Therapeutic Interventions/Progress Updates:     Pt seated in recliner to start session - no reports of pain and agreeable to therapy session. Sit<>stand from recliner with minA for powering to rise and CGA for balance with RW support - ambulatory transfer with CGA and RW to wheelchair. Wheeled to main rehab gym inside // bars with totalA for time. Worked on stair progressions as these are primary barrier to DC. Worked on alternating toe taps to 3inch platform with CGA for balance and BUE support to // bar - increased difficulty with LLE due to weakness but able to clear 3inch step (compensatory strategy with hip hiking and lateral trunk lean to the R). Progressed to step-ups onto 3inch platform 2x10 on R followed by step-ups + LLE knee drives 2x10. Seated rest breaks throughout. Pt pleased with progress. Wheeled to [employer] where he completed ambulatory transfer with CGA and RW (minA for sit<stands). Requires assist for positioning in Nustep for LE management. Completed 5 minutes at workload 4, using BUE's & BLEs, working on global strengthening and endurance training. Pt assisted off of Nustep with minA and wheeled back to his room in w/c for time. Ambulatory transfer with CGA and RW to recliner and pt made comfortable. Chair alarm on and all needs in reach.     Therapy Documentation Precautions:  Precautions Precautions: Fall Precaution Comments: monitor SpO2; L drain Restrictions Weight Bearing Restrictions: No General:     Therapy/Group: Individual Therapy  Anasha Perfecto P Sianna Garofano 02/10/2021, 10:01 AM

## 2021-02-10 NOTE — Progress Notes (Signed)
Patient has home CPAP unit at bedside. Patient is able to place self on/off as needed. No assistance needed from RT at this time.

## 2021-02-10 NOTE — Progress Notes (Signed)
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 No results for input(s): WBC, HGB, HCT, PLT in the last 72 hours. No results for input(s): NA, K, CL, CO2, GLUCOSE, BUN, CREATININE, CALCIUM in the last 72 hours.  Intake/Output Summary (Last 24 hours) at 02/10/2021 0737 Last data filed at 02/10/2021 0500 Gross per 24 hour  Intake 820 ml  Output 1725 ml  Net -905 ml         Physical Exam: Vital Signs Blood pressure (!) 141/75, pulse 86, temperature 97.7 F (36.5 C), temperature source Oral, resp. rate 18, height 5\' 7"  (1.702 m), weight 110 kg, SpO2 95 %.   General: awake, alert, appropriate, sitting up in bed; just woke up; NAD HENT: conjugate gaze; oropharynx moist CV: regular rate; no JVD Pulmonary: CTA B/L; no W/R/R- good air movement GI:  softer; less distended, protuberant; normoactive BS Psychiatric: appropriate Neurological: alert  Skin: Warm and dry.  Intact.  Right knee with dressing CDI. Back incision with dressing- is actually healed- d/c'd dressing.  Psych: Normal mood.  Normal behavior. Musc: No extreme edema.  No tenderness in extremities. Neuro: Alert and oriented  Motor: Bilateral upper extremities: 5/5 proximal distal Right lower extremity: Hip flexion, knee extension 4-/5, ankle dorsiflexion 3-/5 Left lower extremity: Hip flexion, knee extension 3/5, ankle dorsiflexion 2-/5    Assessment/Plan: 1. Functional deficits which require 3+ hours per day of interdisciplinary therapy in a comprehensive inpatient rehab setting. Physiatrist is providing close team supervision and 24 hour management of active medical problems listed below. Physiatrist and rehab team continue to assess barriers to discharge/monitor patient progress  toward functional and medical goals  Care Tool:  Bathing    Body parts bathed by patient: Right arm, Left arm, Chest, Abdomen, Front perineal area, Right upper leg, Left upper leg, Face, Right lower leg, Left lower leg   Body parts bathed by helper: Buttocks     Bathing assist Assist Level: Minimal Assistance - Patient > 75%     Upper Body Dressing/Undressing Upper body dressing   What is the patient wearing?: Pull over shirt    Upper body assist Assist Level: Set up assist    Lower Body Dressing/Undressing Lower body dressing      What is the patient wearing?: Pants     Lower body assist Assist for lower body dressing: Moderate Assistance - Patient 50 - 74%     Toileting Toileting    Toileting assist Assist for toileting: Maximal Assistance - Patient 25 - 49% Assistive Device Comment:  (urinal)   Transfers Chair/bed transfer  Transfers assist     Chair/bed transfer assist level: Minimal Assistance - Patient > 75%     Locomotion Ambulation   Ambulation assist      Assist level: Minimal Assistance - Patient > 75% Assistive device: Walker-rolling Max distance: 50 ft   Walk 10 feet activity   Assist     Assist level: Minimal Assistance - Patient > 75% Assistive device:  Walker-rolling   Walk 50 feet activity   Assist    Assist level: Minimal Assistance - Patient > 75% Assistive device: Walker-rolling    Walk 150 feet activity   Assist Walk 150 feet activity did not occur: Safety/medical concerns         Walk 10 feet on uneven surface  activity   Assist Walk 10 feet on uneven surfaces activity did not occur: Safety/medical concerns         Wheelchair     Assist Will patient use wheelchair at discharge?: Yes Type of Wheelchair: Manual    Wheelchair assist level: Supervision/Verbal cueing Max wheelchair distance: 250 ft    Wheelchair 50 feet with 2 turns activity    Assist        Assist Level:  Supervision/Verbal cueing   Wheelchair 150 feet activity     Assist      Assist Level: Supervision/Verbal cueing   Blood pressure (!) 141/75, pulse 86, temperature 97.7 F (36.5 C), temperature source Oral, resp. rate 18, height 5\' 7"  (1.702 m), weight 110 kg, SpO2 95 %.  Medical Problem List and Plan: 1.   Debility secondary to sepsis due to E. Coli/ Left psoas abscess.S/Pwound debridement 7/19  Continue CIR 2.  Antithrombotics: -DVT/anticoagulation: Lovenox             -antiplatelet therapy: N/A 3. Pain Management: Oxycodone as needed  8/15- pain controlled with meds prn- con't regimen 4. Mood: Provide emotional support             -antipsychotic agents: N/A 5. Neuropsych: This patient is capable of making decisions on his own behalf. 6. Skin/Wound Care: Routine skin checks- remove honeycomb in a few days.  7. Fluids/Electrolytes/Nutrition: Routine in and out 8. ID. Continue Cefazolin 2 gram every 8 hours through 03/11/2021 per infectious disease.   Drains to be removed?  8/20  8/12- per ID, needs CT of spine, Chest, abd and pelvis 1st week of September as well as IV antibiotics til 9/14.   8/15- less than 5cc in bag- said was changed overnight but less fluid in it- con't to monitor; CRP down to 10.4- will monitor weekly.  Monitoring output, IR rec daily flush of 5-36ml which needs to be subtracted from output  9.  Left knee pseudogout.  Status post aspiration.  Continue colchicine 10.  Acute on chronic anemia.  Follow-up CBC  Hemoglobin 8.8 on 8/12  Continue to monitor 11. Hyponatremia.    Sodium 133 on 8/12 Continue to monitor 12.  Morbid obesity:.10/12.Follow up dietery.  Encourage weight loss 13.  OSA.  BiPAP- pt says pressure too high and wears O2 instead.  14. Constipation Good BM on 8/13 15.  Leukocytosis  WBCs 11.9 on 8/12  See #8  Afebrile, continue monitor for signs and symptoms of infection 16.  Prediabetes  Monitor with increased mobility  8/15- last  BG was 124 on BMP on 8/12- con't to monitor  LOS: 7 days A FACE TO FACE EVALUATION WAS PERFORMED  10/12 02/10/2021, 7:37 AM

## 2021-02-10 NOTE — Progress Notes (Signed)
[name], male   DOB: August 23, 1950, 70 y.o.   MRN: [id number]  Met with pt and called wife on speaker with pt, to discuss team conference goals supervision-min level and target discharge date of 8/22. Discussed with wife family training prior to husband going home. Have scheduled for Friday at 1;00-3:00 pm. Will let team know and IV RN.

## 2021-02-10 NOTE — Patient Care Conference (Signed)
Inpatient RehabilitationTeam Conference and Plan of Care Update Date: 02/10/2021   Time: 11:36 AM    Patient Name: Marco Kennedy      Medical Record Number: [id number]  Date of Birth: 05-22-1951 Sex: Male         Room/Bed: 4M12C/4M12C-01 Payor Info: Payor: BLUE CROSS BLUE SHIELD / Plan: BCBS COMM PPO / Product Type: *No Product type* /    Admit Date/Time:  02/03/2021  4:27 PM  Primary Diagnosis:  Vertebral osteomyelitis [facility])  Hospital Problems: Principal Problem:   Vertebral osteomyelitis (HCC) Active Problems:   Obesity, Class III, BMI 40-49.9 (morbid obesity) (HCC)   Sepsis due to Escherichia coli (E. coli) (HCC)   Sepsis (HCC)   Abdominal fluid collection   Constipation by delayed colonic transit   Slow transit constipation   Hyponatremia   Acute on chronic anemia   Prediabetes    Expected Discharge Date: Expected Discharge Date: 02/16/21  Team Members Present: Physician leading conference: Dr. Faith Rogue Social Worker Present: Dossie Der, LCSW Nurse Present: Kennyth Arnold, RN PT Present: Bernie Covey, PT OT Present: Earleen Newport, OT PPS Coordinator present : Fae Pippin, SLP     Current Status/Progress Goal Weekly Team Focus  Bowel/Bladder   Pt is cont of B/B. LBM 02/08/21  Pt remain cont of B/B  toilet pt as needed   Swallow/Nutrition/ Hydration             ADL's   Min/Mod LB bathe and dress with AE, CGA ambulatory transfers, sink level bathing, Min A toileting for posterior, L knee weakness  Min A LB ADLs (wife helps), Supervision transfers  standing balance/tolerance, ADL retraining, AE, DC planning   Mobility   CGA-min STS, CGA gait with RW up to 200 ft  supervision overall  LE strength, coordination, stair training   Communication             Safety/Cognition/ Behavioral Observations            Pain   Pt complains of back pain at a 4 out of 10.  Pt pain to be < 3  Assess pt for oain qshift and provide PRN medication or pain  relief  methods as needed   Skin   Pt has a surgical incision on back and nonpressure would on L buttock.  Pt skin to be clean and intact with no sign of injury or breakdown  Asses skin qshift and provide skin care     Discharge Planning:  Home with wife who does work but may take some time off to be there with him when first goes home.   Team Discussion: Debility, still having output from drain site. Antibiotics through September. Likely to go home with drain. Continent B/B, pain is brought on by activity, Oxy PRN effective. Right PICC for IV antibiotics, wound on buttocks, back incision looks good, nursing to educate family on JP drain. Min/mod assist ADL's, tried AE, sink level bathing. Functional mobility contact guard with RW. Working on Social research officer, government. Contact guard/min assist STS. Contact guard 200 ft. Supervision goals, getting ready for stairs. Patient on target to meet rehab goals: yes  *See Care Plan and progress notes for long and short-term goals.   Revisions to Treatment Plan:  Not at this time.  Teaching Needs: Family eduction, medication management, pain management, skin/wound care, drain care education, IV antibiotic education, transfer training, gait training, balance training, endurance training, stair training, safety awareness.  Current Barriers to Discharge: Decreased caregiver support, Medical stability,  Home enviroment access/layout, IV antibiotics, Wound care, Lack of/limited family support, Weight, and Medication compliance  Possible Resolutions to Barriers: Continue current medications, provide emotional support.     Medical Summary Current Status: ongoing wound drainage, drain in place. ID/IR following. pain controlled. remains on abx thru 9/14  Barriers to Discharge: Medical stability   Possible Resolutions to Barriers/Weekly Focus: daily assessment and treatment of wounds, pain, pt data, VS   Continued Need for Acute Rehabilitation Level of Care: The patient  requires daily medical management by a physician with specialized training in physical medicine and rehabilitation for the following reasons: Direction of a multidisciplinary physical rehabilitation program to maximize functional independence : Yes Medical management of patient stability for increased activity during participation in an intensive rehabilitation regime.: Yes Analysis of laboratory values and/or radiology reports with any subsequent need for medication adjustment and/or medical intervention. : Yes   I attest that I was present, lead the team conference, and concur with the assessment and plan of the team.   Tennis Must 02/10/2021, 4:35 PM

## 2021-02-10 NOTE — Progress Notes (Signed)
Physical Therapy Session Note  Patient Details  Name: Marco Kennedy MRN: [id number] Date of Birth: [birth year]  Today's Date: 02/10/2021 PT Individual Time: 1300-1345 PT Individual Time Calculation (min): 45 min   Short Term Goals: Week 1:  PT Short Term Goal 1 (Week 1): Pt will navigate 3" stairs x 8 PT Short Term Goal 2 (Week 1): Pt will ambulate 200 ft with LRAD PT Short Term Goal 3 (Week 1): Pt will perform x 10 min exercise to improve functional endurance.  Skilled Therapeutic Interventions/Progress Updates:    Pt received in recliner and agreeable to therapy.  No complaint of pain. Donned knee sleeves with assist to thread. Sit to stand with RW CGA throughout session, markedly improved from previous sessions. Gait with RW and CGA to/from gym ~75 ft with one instance of catching L toe when fatigued. Session focused on stair training. Pt navigated 3" steps 2 x 8 with extended seated rest break. Pt uses step to pattern and stated this was his typical method PTA. 6" steps 2 x 2, ascending forward and descending backward. Pt states he is more comfortable descending backwards, especially when he only has one rail, and used this strategy prior to admission. On returning to room, pt states he thinks wearing compression sleeves on BIL knees helped with pain and feelings of weakness, likely to continue to wear them with therapy. Pt returned to recliner after session and was left with all needs in reach and alarm active.   Therapy Documentation Precautions:  Precautions Precautions: Fall Precaution Comments: monitor SpO2; L drain Restrictions Weight Bearing Restrictions: No    Therapy/Group: Individual Therapy  [name] 02/10/2021, 4:08 PM

## 2021-02-11 DIAGNOSIS — R5381 Other malaise: Secondary | ICD-10-CM

## 2021-02-11 MED ORDER — POLYETHYLENE GLYCOL 3350 17 G PO PACK
17.0000 g | PACK | Freq: Two times a day (BID) | ORAL | Status: DC
  Administered 2021-02-11 – 2021-02-16 (×11): 17 g via ORAL
  Filled 2021-02-11 (×11): qty 1

## 2021-02-11 MED ORDER — SENNOSIDES-DOCUSATE SODIUM 8.6-50 MG PO TABS
2.0000 | ORAL_TABLET | Freq: Two times a day (BID) | ORAL | Status: DC
  Administered 2021-02-11 – 2021-02-16 (×11): 2 via ORAL
  Filled 2021-02-11 (×11): qty 2

## 2021-02-11 NOTE — Progress Notes (Signed)
Physical Therapy Weekly Progress Note  Patient Details  Name: Marco Kennedy MRN: [id number] Date of Birth: [birth year]  Beginning of progress report period: February 04, 2021 End of progress report period: February 11, 2021  Today's Date: 02/11/2021 PT Individual Time: 1020-1102, 9509-3267 PT Individual Time Calculation (min): 42 min, 70 min  Patient has met 3 of 3 short term goals.  Pt performing Sit to stand with RW and CGA from recliner and elevated surface, requires min A for lower surfaces. Gait up to 200 ft with RW and CGA. W/c mobility up to 500 ft with supervision. UP to 2 6" stairs with CGA, up to 8 3" stairs with CGA.   Patient continues to demonstrate the following deficits muscle weakness and decreased cardiorespiratoy endurance and therefore will continue to benefit from skilled PT intervention to increase functional independence with mobility.  Patient progressing toward long term goals..  Continue plan of care.  PT Short Term Goals Week 1:  PT Short Term Goal 1 (Week 1): Pt will navigate 3" stairs x 8 PT Short Term Goal 1 - Progress (Week 1): Met PT Short Term Goal 2 (Week 1): Pt will ambulate 200 ft with LRAD PT Short Term Goal 2 - Progress (Week 1): Met PT Short Term Goal 3 (Week 1): Pt will perform x 10 min exercise to improve functional endurance. PT Short Term Goal 3 - Progress (Week 1): Met Week 2:  PT Short Term Goal 2 (Week 2): =LTGs d/t ELOS  Skilled Therapeutic Interventions/Progress Updates:    AM session: Pt received in recliner and agreeable to therapy.  No complaint of pain. ambulatory transfer to w/c with CGA. Pt propelled w/c with BUE x ~500 ft off unit to outdoor environment. Navigated differing grades and mildy uneven surface. Gait x 115 ft w/ RW and CGA. Pt reported that "it was a good workout" and propelled w/c x 300 ft. Pt returned to room after session and ambulated to recliner. Pt was left with all needs in reach and alarm active.   PM session: Pt in  recliner on arrival, requesting to attempt bowel movement. ambulatory transfer to bathroom with RW and CGA. Pt attempted to have BM for several minutes without success, including stand and sitting several times with CGA. Min A for clothing management to lift shorts from floor. Pt repeated process again at end of session. Pt c/o bloating and discomfort from constipation throughout session.  Pt reported back pain that he attributes to washing hair at sink earlier in the morning. Pain addressed with supine lumbar twists and manual therapy to paraspinals x 20 min.  Pt then performed STS from elevated bed, min A x 2. Sit to stand from recliner with CGA x 3. Discussed differences between surfaces and educated on mechanics. Gait 2 x 100 ft in hopes of stimulating bowels without success. Pt returned to recliner and was left with all needs in reach and alarm active.   Therapy Documentation Precautions:  Precautions Precautions: Fall Precaution Comments: monitor SpO2; L drain Restrictions Weight Bearing Restrictions: No    Therapy/Group: Individual Therapy  Mickel Fuchs 02/11/2021, 3:06 PM

## 2021-02-11 NOTE — Progress Notes (Signed)
Occupational Therapy Weekly Progress Note  Patient Details  Name: Marco Kennedy MRN: [id number] Date of Birth: 05-16-1951  Beginning of progress report period: February 04, 2021 End of progress report period: February 11, 2021  Today's Date: 02/11/2021 OT Individual Time: 7616-0737 OT Individual Time Calculation (min): 85 min    Patient has met 3 of 3 short term goals.  Pt has been an active participant and motived to participate in OT, performing ambulatory transfers to/from room <> bathroom with CGA and RW, LB dressing with assist to thread but able to don over hips in standing, sink level bathing with Min/Mod A with use of LHS. Pt is progressing toward LTGs at [facility] for LB and Supervision for transfers, family training planned with wife Marco Kennedy later this week.   Patient continues to demonstrate the following deficits: muscle weakness, decreased cardiorespiratoy endurance, decreased motor planning, and decreased sitting balance, decreased standing balance, decreased postural control, and decreased balance strategies and therefore will continue to benefit from skilled OT intervention to enhance overall performance with BADL, iADL, and Reduce care partner burden.  Patient progressing toward long term goals..  Continue plan of care.  OT Short Term Goals Week 1:  OT Short Term Goal 1 (Week 1): Pt will complete BSC/toilet transfers CGA with LRAD OT Short Term Goal 1 - Progress (Week 1): Met OT Short Term Goal 2 (Week 1): Pt will complete ADL/activity of choice for 10 mins with minimal fatigue OT Short Term Goal 2 - Progress (Week 1): Met OT Short Term Goal 3 (Week 1): Pt will complete LB dress with AE PRN no more than MOD A OT Short Term Goal 3 - Progress (Week 1): Met Week 2:  OT Short Term Goal 1 (Week 2): STGs = LTGs d/t ELOS  Skilled Therapeutic Interventions/Progress Updates:    Pt greeted at time of session semireclined in bed, no pain reported but wanting to try to use bathroom. Supine >  sidelying > sitting from flat bed to simulate home with Supervision and extended time using rail. Sit > stand from simulated bed height at home CGA and ambulated to bathroom same manner. Again trialing today to have BM, prune juice given and propped feet up on 4" block with no result. Pt ambulated bathroom > sink CGA with RW and performed UB/LB bathing seated and sit <> stand for periarea and buttocks with pt able to reach buttocks for first time but eventually requesting therapist assist d/t fatigue and thoroughness. UB dress set up and donned shorts Min A today with extended time to thread while holding on to sink surface. Note attempted to find reacher and dressing stick but unable at this time but would benefit. Assisted with hair washing at sink level today as pt is not cleared to shower, and pt performing drying and grooming tasks seated. Encouraged pt throughout session to manage own wheelchair, clip drain bag, etc. Set up in recliner with alarm on call bell in reach.       Therapy Documentation Precautions:  Precautions Precautions: Fall Precaution Comments: monitor SpO2; L drain Restrictions Weight Bearing Restrictions: No     Therapy/Group: Individual Therapy  Viona Gilmore 02/11/2021, 7:14 AM

## 2021-02-11 NOTE — Progress Notes (Signed)
PROGRESS NOTE   Subjective/Complaints: Patient seen sitting up this morning.  He states he slept well overnight.  He states he is having difficulty initiating bowel movement.  ROS: Denies CP, SOB, N/V/D  Objective:   No results found. No results for input(s): WBC, HGB, HCT, PLT in the last 72 hours. No results for input(s): NA, K, CL, CO2, GLUCOSE, BUN, CREATININE, CALCIUM in the last 72 hours.  Intake/Output Summary (Last 24 hours) at 02/11/2021 1033 Last data filed at 02/11/2021 0824 Gross per 24 hour  Intake 368 ml  Output 1425 ml  Net -1057 ml         Physical Exam: Vital Signs Blood pressure 132/76, pulse 87, temperature 99.5 F (37.5 C), temperature source Oral, resp. rate 16, height 5\' 7"  (1.702 m), weight 109.7 kg, SpO2 94 %. Constitutional: No distress . Vital signs reviewed. HENT: Normocephalic.  Atraumatic. Eyes: EOMI. No discharge. Cardiovascular: No JVD.  RRR. Respiratory: Normal effort.  No stridor.  Bilateral clear to auscultation. GI: Non-distended.  BS +. Skin: Warm and dry.  Intact. Psych: Normal mood.  Normal behavior. Musc: No edema in extremities.  No tenderness in extremities. Neuro: Alert and oriented  Motor: Bilateral upper extremities: 5/5 proximal distal Right lower extremity: Hip flexion, knee extension 4-/5, ankle dorsiflexion 3-/5, stable Left lower extremity: Hip flexion, knee extension 3/5, ankle dorsiflexion 2-/5    Assessment/Plan: 1. Functional deficits which require 3+ hours per day of interdisciplinary therapy in a comprehensive inpatient rehab setting. Physiatrist is providing close team supervision and 24 hour management of active medical problems listed below. Physiatrist and rehab team continue to assess barriers to discharge/monitor patient progress toward functional and medical goals  Care Tool:  Bathing    Body parts bathed by patient: Right arm, Left arm, Chest,  Abdomen, Front perineal area, Right upper leg, Left upper leg, Face, Right lower leg, Left lower leg   Body parts bathed by helper: Buttocks     Bathing assist Assist Level: Minimal Assistance - Patient > 75%     Upper Body Dressing/Undressing Upper body dressing   What is the patient wearing?: Pull over shirt    Upper body assist Assist Level: Set up assist    Lower Body Dressing/Undressing Lower body dressing      What is the patient wearing?: Pants     Lower body assist Assist for lower body dressing: Minimal Assistance - Patient > 75% (extended time to thread)     Toileting Toileting    Toileting assist Assist for toileting: Moderate Assistance - Patient 50 - 74% Assistive Device Comment:  (urinal)   Transfers Chair/bed transfer  Transfers assist     Chair/bed transfer assist level: Contact Guard/Touching assist     Locomotion Ambulation   Ambulation assist      Assist level: Minimal Assistance - Patient > 75% Assistive device: Walker-rolling Max distance: 50 ft   Walk 10 feet activity   Assist     Assist level: Minimal Assistance - Patient > 75% Assistive device: Walker-rolling   Walk 50 feet activity   Assist    Assist level: Minimal Assistance - Patient > 75% Assistive device: Walker-rolling  Walk 150 feet activity   Assist Walk 150 feet activity did not occur: Safety/medical concerns         Walk 10 feet on uneven surface  activity   Assist Walk 10 feet on uneven surfaces activity did not occur: Safety/medical concerns         Wheelchair     Assist Will patient use wheelchair at discharge?: Yes Type of Wheelchair: Manual    Wheelchair assist level: Supervision/Verbal cueing Max wheelchair distance: 250 ft    Wheelchair 50 feet with 2 turns activity    Assist        Assist Level: Supervision/Verbal cueing   Wheelchair 150 feet activity     Assist      Assist Level: Supervision/Verbal  cueing   Blood pressure 132/76, pulse 87, temperature 99.5 F (37.5 C), temperature source Oral, resp. rate 16, height 5\' 7"  (1.702 m), weight 109.7 kg, SpO2 94 %.  Medical Problem List and Plan: 1.   Debility secondary to sepsis due to E. Coli/ Left psoas abscess.S/Pwound debridement 7/19  Continue CIR 2.  Antithrombotics: -DVT/anticoagulation: Lovenox             -antiplatelet therapy: N/A 3. Pain Management: Oxycodone as needed  Controlled on 8/17 4. Mood: Provide emotional support             -antipsychotic agents: N/A 5. Neuropsych: This patient is capable of making decisions on his own behalf. 6. Skin/Wound Care: Routine skin checks- remove honeycomb in a few days.  7. Fluids/Electrolytes/Nutrition: Routine in and out 8. ID. Continue Cefazolin 2 gram every 8 hours through 03/11/2021 per infectious disease.   Drains to be removed?  8/20  8/12- per ID, needs CT of spine, Chest, abd and pelvis 1st week of September as well as IV antibiotics til 9/14.   Monitoring output, IR rec daily flush of 5-49ml which needs to be subtracted from output  9.  Left knee pseudogout.  Status post aspiration.  Continue colchicine 10.  Acute on chronic anemia.  Follow-up CBC  Hemoglobin 8.8 on 8/12  Continue to monitor 11. Hyponatremia.    Sodium 133 on 8/12 Continue to monitor 12.  Morbid obesity:.10/12.Follow up dietery.  Encourage weight loss 13.  OSA.  BiPAP- pt says pressure too high and wears O2 instead.  14. Constipation Bowel meds increased again on 8/17 15.  Leukocytosis  WBCs 11.9 on 8/12  See #8  Afebrile, continue monitor for signs and symptoms of infection 16.  Prediabetes  Monitor with increased mobility  8/15- last BG was 124 on BMP on 8/12- con't to monitor  LOS: 8 days A FACE TO FACE EVALUATION WAS PERFORMED  Kohen Reither 10/12 02/11/2021, 10:33 AM

## 2021-02-11 NOTE — Progress Notes (Signed)
Pt able to place home CPAP when ready for bed. Advised pt notify for RT if any further assistance is needed.

## 2021-02-11 NOTE — Progress Notes (Signed)
Physical Therapy Session Note  Patient Details  Name: Marco Kennedy MRN: [id number] Date of Birth: 06/12/1951  Today's Date: 02/11/2021 PT Individual Time: 1120-1210 PT Individual Time Calculation (min): 50 min   Short Term Goals: Week 1:  PT Short Term Goal 1 (Week 1): Pt will navigate 3" stairs x 8 PT Short Term Goal 1 - Progress (Week 1): Met PT Short Term Goal 2 (Week 1): Pt will ambulate 200 ft with LRAD PT Short Term Goal 2 - Progress (Week 1): Met PT Short Term Goal 3 (Week 1): Pt will perform x 10 min exercise to improve functional endurance. PT Short Term Goal 3 - Progress (Week 1): Met  Skilled Therapeutic Interventions/Progress Updates: Pt presented in w/c agreeable to therapy. Pt states already feel fatigued due to previous PT session. Pt declined stair training however agreeable to NuStep for general conditioning. Pt transported to ortho gym for energy conservation and performed ambulatory transfer with RW and CGA to NuStep. Pt required increased time but was able to perform sit to/from stand transfers at w/c and NuStep with CGA. Pt participated in NuStep L4 6 min x 2 with rest break between bouts. Pt then performed ambulatory transfer to high/low mat and PTA instructed pt to perform seated hamstring stretch using step (or footstool). Performed 30sec x 3 bilaterally. PTA also instructed pt to perform heel cord stretch using sheet 1 min x 2. Performed ambulatory transfer to w/c in same manner as prior and transported back to room. Pt then performed ambulatory transfer to recliner once back in room and repositioned to comfort. Pt left in recliner at end of session with call bell within reach and needs met.      Therapy Documentation Precautions:  Precautions Precautions: Fall Precaution Comments: monitor SpO2; L drain Restrictions Weight Bearing Restrictions: No    Therapy/Group: Individual Therapy  Rosita DeChalus Rosita DeChalus, PTA  02/11/2021, 3:49 PM  

## 2021-02-12 MED ORDER — CEFAZOLIN IV (FOR PTA / DISCHARGE USE ONLY): 2.0000 g | Freq: Three times a day (TID) | INTRAVENOUS | 0 refills | Status: DC

## 2021-02-12 MED ORDER — FLEET ENEMA 7-19 GM/118ML RE ENEM
1.0000 | ENEMA | Freq: Once | RECTAL | Status: AC
  Administered 2021-02-12: 1 via RECTAL
  Filled 2021-02-12: qty 1

## 2021-02-12 MED ORDER — SODIUM CHLORIDE 0.9 % IV SOLN
INTRAVENOUS | Status: DC | PRN
  Administered 2021-02-12: 250 mL via INTRAVENOUS
  Administered 2021-02-13 – 2021-02-15 (×4): 1000 mL via INTRAVENOUS

## 2021-02-12 NOTE — Progress Notes (Signed)
PROGRESS NOTE   Subjective/Complaints:   Constipated, feels bloated but no abd pain.  Has had stool softener and Miralx but little results Needed enema on acute to move bowels Has had 3 small BMs since yesterday   ROS: Denies CP, SOB, N/V/D  Objective:   No results found. No results for input(s): WBC, HGB, HCT, PLT in the last 72 hours. No results for input(s): NA, K, CL, CO2, GLUCOSE, BUN, CREATININE, CALCIUM in the last 72 hours.  Intake/Output Summary (Last 24 hours) at 02/12/2021 0812 Last data filed at 02/12/2021 0542 Gross per 24 hour  Intake 490 ml  Output 1850 ml  Net -1360 ml         Physical Exam: Vital Signs Blood pressure (!) 148/82, pulse 92, temperature 99.3 F (37.4 C), temperature source Oral, resp. rate 20, height 5\' 7"  (1.702 m), weight 109 kg, SpO2 95 %.  General: No acute distress Mood and affect are appropriate Heart: Regular rate and rhythm no rubs murmurs or extra sounds Lungs: Clear to auscultation, breathing unlabored, no rales or wheezes Abdomen: Positive bowel sounds, soft nontender to palpation, nondistended Extremities: No clubbing, cyanosis, or edema Skin: No evidence of breakdown, no evidence of rash  Motor: Bilateral upper extremities: 5/5 proximal distal Right lower extremity: Hip flexion, knee extension 4-/5, ankle dorsiflexion 3-/5, stable Left lower extremity: Hip flexion, knee extension 3/5, ankle dorsiflexion 2-/5    Assessment/Plan: 1. Functional deficits which require 3+ hours per day of interdisciplinary therapy in a comprehensive inpatient rehab setting. Physiatrist is providing close team supervision and 24 hour management of active medical problems listed below. Physiatrist and rehab team continue to assess barriers to discharge/monitor patient progress toward functional and medical goals  Care Tool:  Bathing    Body parts bathed by patient: Right arm, Left arm,  Chest, Abdomen, Front perineal area, Right upper leg, Left upper leg, Face, Right lower leg, Left lower leg   Body parts bathed by helper: Buttocks     Bathing assist Assist Level: Minimal Assistance - Patient > 75%     Upper Body Dressing/Undressing Upper body dressing   What is the patient wearing?: Pull over shirt    Upper body assist Assist Level: Set up assist    Lower Body Dressing/Undressing Lower body dressing      What is the patient wearing?: Pants     Lower body assist Assist for lower body dressing: Minimal Assistance - Patient > 75% (extended time to thread)     Toileting Toileting    Toileting assist Assist for toileting: Moderate Assistance - Patient 50 - 74% Assistive Device Comment:  (urinal)   Transfers Chair/bed transfer  Transfers assist     Chair/bed transfer assist level: Contact Guard/Touching assist     Locomotion Ambulation   Ambulation assist      Assist level: Minimal Assistance - Patient > 75% Assistive device: Walker-rolling Max distance: 50 ft   Walk 10 feet activity   Assist     Assist level: Minimal Assistance - Patient > 75% Assistive device: Walker-rolling   Walk 50 feet activity   Assist    Assist level: Minimal Assistance - Patient > 75% Assistive  device: Walker-rolling    Walk 150 feet activity   Assist Walk 150 feet activity did not occur: Safety/medical concerns         Walk 10 feet on uneven surface  activity   Assist Walk 10 feet on uneven surfaces activity did not occur: Safety/medical concerns         Wheelchair     Assist Will patient use wheelchair at discharge?: Yes Type of Wheelchair: Manual    Wheelchair assist level: Supervision/Verbal cueing Max wheelchair distance: 250 ft    Wheelchair 50 feet with 2 turns activity    Assist        Assist Level: Supervision/Verbal cueing   Wheelchair 150 feet activity     Assist      Assist Level:  Supervision/Verbal cueing   Blood pressure (!) 148/82, pulse 92, temperature 99.3 F (37.4 C), temperature source Oral, resp. rate 20, height 5\' 7"  (1.702 m), weight 109 kg, SpO2 95 %.  Medical Problem List and Plan: 1.   Debility secondary to sepsis due to E. Coli/ Left psoas abscess.S/Pwound debridement 7/19  Continue CIR- ELOS 8/22 2.  Antithrombotics: -DVT/anticoagulation: Lovenox             -antiplatelet therapy: N/A 3. Pain Management: Oxycodone as needed  Controlled on 8/17 4. Mood: Provide emotional support             -antipsychotic agents: N/A 5. Neuropsych: This patient is capable of making decisions on his own behalf. 6. Skin/Wound Care: Routine skin checks- remove honeycomb in a few days.  7. Fluids/Electrolytes/Nutrition: Routine in and out 8. ID. Continue Cefazolin 2 gram every 8 hours through 03/11/2021 per infectious disease.   Drains to be removed?  8/20  8/12- per ID, needs CT of spine, Chest, abd and pelvis 1st week of September as well as IV antibiotics til 9/14.   Monitoring output, IR rec daily flush of 5-80ml which needs to be subtracted from output  9.  Left knee pseudogout.  Status post aspiration.  Continue colchicine 10.  Acute on chronic anemia.  Follow-up CBC  Hemoglobin 8.8 on 8/12  Continue to monitor 11. Hyponatremia.    Sodium 133 on 8/12 Continue to monitor 12.  Morbid obesity:.10/12.Follow up dietery.  Encourage weight loss 13.  OSA.  BiPAP- pt says pressure too high and wears O2 instead.  14. Constipation Bowel meds increased again on 8/17, having small results will order fleets 15.  Leukocytosis  WBCs 11.9 on 8/12  See #8  Afebrile, continue monitor for signs and symptoms of infection 16.  Prediabetes  Monitor with increased mobility  8/15- last BG was 124 on BMP on 8/12- con't to monitor  LOS: 9 days A FACE TO FACE EVALUATION WAS PERFORMED  10/12 02/12/2021, 8:12 AM

## 2021-02-12 NOTE — Progress Notes (Signed)
Referring Physician(s): Manandhar,S  Supervising Physician: Corrie Mckusick  Patient Status:  [facility] [address] - In-pt  Chief Complaint:  Left retroperitoneal fluid collection S/p drain placement with IR on 8/2, changed from suction bulb to gravity bag due to high OP on 8/5 ; constipation  Subjective: Pt's only c/o at this time is constipation; denies worsening flank pain,N/V   Allergies: Patient has no known allergies.  Medications: Prior to Admission medications   Medication Sig Start Date End Date Taking? Authorizing Provider  acetaminophen (TYLENOL) 325 MG tablet Take 2 tablets (650 mg total) by mouth every 6 (six) hours as needed for mild pain (or Fever >/= 101). 02/03/21   Debbe Odea, MD  ceFAZolin (ANCEF) IVPB Inject 2 g into the vein every 8 (eight) hours. Indication:  vertebral osteomyelitis First Dose: No Last Day of Therapy:  02/24/2021 Labs - Once weekly:  CBC/D and BMP, Labs - Every other week:  ESR and CRP Method of administration: IV Push Method of administration may be changed at the discretion of home infusion pharmacist based upon assessment of the patient and/or caregiver's ability to self-administer the medication ordered. 01/15/21 02/24/21  Marvis Moeller, NP  cholecalciferol (VITAMIN D3) 25 MCG (1000 UNIT) tablet Take 1,000 Units by mouth daily.    [provider]  colchicine 0.6 MG tablet Take 1 tablet (0.6 mg total) by mouth daily. 02/03/21   Debbe Odea, MD  feeding supplement (ENSURE ENLIVE / ENSURE PLUS) LIQD Take 237 mLs by mouth 3 (three) times daily between meals. 02/03/21   Debbe Odea, MD  Multiple Vitamin (MULTIVITAMIN WITH MINERALS) TABS tablet Take 1 tablet by mouth daily.    [provider]  polyethylene glycol (MIRALAX / GLYCOLAX) 17 g packet Take 17 g by mouth 2 (two) times daily. 01/15/21   Little Ishikawa, MD     Vital Signs: BP (!) 148/82 (BP Location: Left Arm)   Pulse 92   Temp 99.3 F (37.4 C) (Oral)   Resp 20   Ht  _0  (1.702 m)   Wt 240 lb 4.8 oz (109 kg)   SpO2 95%   BMI 37.64 kg/m   Physical Exam awake/alert; left RP drain intact, dressing dry, site NT; OP 100 cc yellow fluid  Imaging: No results found.  Labs:  CBC: Recent Labs    01/31/21 0500 02/01/21 0152 02/04/21 0425 02/06/21 0506  WBC 13.8* 12.9* 11.7* 11.9*  HGB 8.7* 9.0* 9.0* 8.8*  HCT 27.5* 28.4* 28.7* 28.7*  PLT 463* 499* 489* 435*    COAGS: Recent Labs    01/31/21 0500  INR 1.3*    BMP: Recent Labs    01/31/21 0500 02/01/21 0152 02/04/21 0425 02/06/21 0506  NA 132* 132* 132* 133*  K 4.0 4.2 4.1 4.0  CL 91* 92* 91* 91*  CO2 35* 34* 33* 33*  GLUCOSE 147* 140* 132* 124*  BUN _1 CALCIUM 7.7* 7.9* 8.1* 8.5*  CREATININE 0.76 0.80 0.71 0.67  GFRNONAA >60 >60 >60 >60    LIVER FUNCTION TESTS: Recent Labs    01/31/21 0500 02/01/21 0152 02/04/21 0425 02/06/21 0506  BILITOT 0.6 0.7 0.4 0.3  AST 79* 73* 49* 47*  ALT 36 34 27 22  ALKPHOS 115 107 90 91  PROT 4.9* 5.0* 5.4* 5.5*  ALBUMIN 1.4* 1.5* 1.7* 1.8*    Assessment and Plan: 70 yo male with history of lumbar spinal stenosis s/p lumbar fusions on multiple level on 04/18/2020.  History of abscess  development near existing hardware s/p wound debridement by neurosurgery on 01/13/2021.  Currently admitted due to chest pain and anasarca, CT showed bilateral pleural effusion s/p chest tube placement by CCM and subsequently removed.  Repeat CT CAP on 01/26/2021 showed large left retroperitoneal fluid collection, patient is s/p aspiration and drain placement with IR on 01/27/2021. OP had been high, the drain changed from suction bulb to a gravity bag on 8/5; temp 99.3, no new labs; drain OP 100 cc yellow fluid; fluid cx neg; will recheck fluid creat; monitor labs, if WBC trends up consider f/u CT; otherwise consider f/u CT before pt tent discharged next week( 2 weeks since last CT)   Electronically Signed: [name], PA-C 02/12/2021, 9:42 AM   I  spent a total of 15 minutes at the the patient's bedside AND on the patient's hospital floor or unit, greater than 50% of which was counseling/coordinating care for left retroperitoneal drain    [name], male   DOB: January 08, 1951, 70 y.o.   MRN: [id number]

## 2021-02-12 NOTE — Progress Notes (Signed)
Physical Therapy Session Note  Patient Details  Name: Marco Kennedy MRN: [id number] Date of Birth: 1951-04-18  Today's Date: 02/12/2021 PT Individual Time: 9872-1587 PT Individual Time Calculation (min): 26 min   Short Term Goals: Week 1:  PT Short Term Goal 1 (Week 1): Pt will navigate 3" stairs x 8 PT Short Term Goal 1 - Progress (Week 1): Met PT Short Term Goal 2 (Week 1): Pt will ambulate 200 ft with LRAD PT Short Term Goal 2 - Progress (Week 1): Met PT Short Term Goal 3 (Week 1): Pt will perform x 10 min exercise to improve functional endurance. PT Short Term Goal 3 - Progress (Week 1): Met  Skilled Therapeutic Interventions/Progress Updates:     Pt received seated in WC and agrees to therapy, though reports pain in back and significant fatigue from finishing prior PT session 15 minute prior. WC transport to dayroom for time management. Stand step transfer to Nustep with minA HHA, and slight buckling of L knee after several steps. Pt performs Nustep for strength and endurance training. Pt completes 2x6:00 at workload of 5 with average steps per minute >50. PT provides manual cues for optimal positioning of bilateral lower extremities. Stand pivot back to WC with minA. WC transport back to room. Stand step transfer to recliner with CGA and RW. Left with alarm intact and all needs within reach.  Therapy Documentation Precautions:  Precautions Precautions: Fall Precaution Comments: monitor SpO2; L drain Restrictions Weight Bearing Restrictions: No    Therapy/Group: Individual Therapy  Breck Coons, PT, DPT 02/12/2021, 4:05 PM

## 2021-02-12 NOTE — Progress Notes (Signed)
Patient home CPAP at bedside within patient reach. Patient able to place himself on/off when ready. Advised patient to call for RT if any further assistance is needed.

## 2021-02-12 NOTE — Progress Notes (Signed)
Occupational Therapy Session Note  Patient Details  Name: Marco Kennedy MRN: [id number] Date of Birth: January 08, 1951  Today's Date: 02/12/2021 OT Individual Time: 6811-5726 OT Individual Time Calculation (min): 74 min    Short Term Goals: Week 2:  OT Short Term Goal 1 (Week 2): STGs = LTGs d/t ELOS  Skilled Therapeutic Interventions/Progress Updates:  Pt greeted supine in bed agreeable to OT intervention. Pt required S for bed mobility and CGA for sit<>stand from EOB. Pt completed ambulatory transfer to 3n1 over regular toilet with RW and CGA. MOD for toileting hygiene with pt attemtpting to assist with  pericare but needed assist d/t cleanliness, pt assisting with managing clothing during pericare. Ambulatory transfer from toilet >sink to completd wash up with CGA and RW. MIN A for UB bathing, pt declined LB bathing. Pt required set- up for UB dressing, and light MIN A for LB dressing needing most assist to thread LLE into pants. Pt completed seated oral care and grooming tasks with set- up assist. Pt left seated in recliner with chair alarm activated and all needs within reach.   Therapy Documentation Precautions:  Precautions Precautions: Fall Precaution Comments: monitor SpO2; L drain Restrictions Weight Bearing Restrictions: No  Pain: Pt reports pain in ABD from constipation. Offered rest breaks repositioning and distraction as pain mgmt strategy.    Therapy/Group: Individual Therapy  Pollyann Glen [facility] 02/12/2021, 11:57 AM

## 2021-02-12 NOTE — Progress Notes (Signed)
Occupational Therapy Session Note  Patient Details  Name: Marco Kennedy MRN: [id number] Date of Birth: 1950/10/07  Today's Date: 02/13/2021 OT Individual Time: 1257-1355 OT Individual Time Calculation (min): 58 min   Skilled Therapeutic Interventions/Progress Updates:    Pt greeted in the recliner, spouse Marco Kennedy present for family education. Started with discussing LB dressing. Pt has a reacher + dressing stick at home, does not know how to use these pieces of AE in context of dressing. OT searched unit but unable to find reacher or dressing stick. Therefore, used items in room to simulate dressing. Also advised pt to look on the internet for guided videos on AE use. Both pt and Marco Kennedy verbalized understanding. OT next taught Marco Kennedy 2 methods for donning Ted stockings to increase caregiver ease/improve body mechanics. Marco Kennedy able to return carryover of understanding. She also practiced assisting pt with toilet transfers using RW at ambulatory level. They have an elevated toilet at home, pt able to push  himself up into standing using a modified technique. He and spouse understand his supervision-Min A level self care goals with understanding that f/u therapies will assist him with further gaining independence. They also understand pts need to sponge bathe until given medical clearance from MD. Pt reports he has a swivel seat in the tub shower to use after he is given medical clearance. Advised Marco Kennedy to set pt up with IADL activity at home to continue working on balance, edema control, and activity tolerance. Pt transferred to the w/c and we reviewed w/c parts mgt pertaining to armrests and leg rests. Marco Kennedy had hands on practice with managing w/c components. Left pt in the w/c, anticipating next therapist for family education.   Therapy Documentation Precautions:  Precautions Precautions: Fall Precaution Comments: monitor SpO2; L drain Restrictions Weight Bearing Restrictions: No  Vital Signs:    Pain: notified RN of pts request for pain medicine at close of session   ADL: ADL Eating: Not assessed Grooming: Supervision/safety Upper Body Bathing: Supervision/safety Where Assessed-Upper Body Bathing: Sitting at sink Lower Body Bathing: Maximal assistance Upper Body Dressing: Minimal assistance Lower Body Dressing: Dependent Toileting: Not assessed Toilet Transfer: Moderate assistance Toilet Transfer Method: Stand pivot Tub/Shower Transfer: Not assessed   :     Therapy/Group: Individual Therapy  Jilliam Bellmore A Claressa Hughley 02/13/2021, 3:55 PM

## 2021-02-12 NOTE — Progress Notes (Signed)
Physical Therapy Session Note  Patient Details  Name: Marco Kennedy MRN: [id number] Date of Birth: [birth year]  Today's Date: 02/12/2021 PT Individual Time: 4580-9983 PT Individual Time Calculation (min): 60 min   Short Term Goals: Week 1:  PT Short Term Goal 1 (Week 1): Pt will navigate 3" stairs x 8 PT Short Term Goal 1 - Progress (Week 1): Met PT Short Term Goal 2 (Week 1): Pt will ambulate 200 ft with LRAD PT Short Term Goal 2 - Progress (Week 1): Met PT Short Term Goal 3 (Week 1): Pt will perform x 10 min exercise to improve functional endurance. PT Short Term Goal 3 - Progress (Week 1): Met  Skilled Therapeutic Interventions/Progress Updates:    AM session: Pt received in recliner and agreeable to therapy.  No complaint of pain. Sit to stand throughout session with CGA to RW. Gait x 275 ft with RW and CGA. Pt continues to demo slumped posture with gait, did not improve with verbal cues. Pt utilized kinetron in seated position, 30 cm/sec for 5 x 1 min bouts with 1.5 min rest. Pt then participated in circuit consisting of 30 sec bouts with 10 second breaks, 1.5 minute break between rounds. Circuit consisted of Sit to stand, chest press with 4lb bar, step taps on 1 in step, and modified sit ups with 4lb bar. Pt reported feeling fatigued at completion of exercises. Pt returned to room and opted to remain in w/c d/t pending PT session. Pt was left with all needs in reach and alarm active.   PM session: Pt received in recliner and agreeable to therapy.  No complaint of pain. Pt reports fatigue from previous sessions but agreeable to participate. ambulatory transfer to  w/c with RW and CGA. Pt transported to therapy gym for time management and energy conservation. Stair training on 6" steps, 2 x 4, 1 x 8 with BIL hand rails. Pt demoes heavy reliance on hand rail to ascend steps and opts to descend backwards, reporting that he feels safer in that direction. Pt then performed several trials of  ascending single step with minimal support with the R hand to mimic home environment. Pt then propelled w/c to ortho gym. Pt navigated ramp, mulch, and curb step with CGA throughout, min A to lift R foot on to 7" curb step. Pt then returned to room and transferred to bed with CGA Stand pivot transfer with RW. Mod a sit>supine to lift BLE. Pt able to scoot up in bed without assistance in trendelenburg position. Pt was left with all needs in reach and alarm active.    Therapy Documentation Precautions:  Precautions Precautions: Fall Precaution Comments: monitor SpO2; L drain Restrictions Weight Bearing Restrictions: No   Therapy/Group: Individual Therapy  Mickel Fuchs 02/12/2021, 4:25 PM

## 2021-02-13 LAB — COMPREHENSIVE METABOLIC PANEL
ALT: 12 U/L (ref 0–44)
AST: 25 U/L (ref 15–41)
Albumin: 2 g/dL — ABNORMAL LOW (ref 3.5–5.0)
Alkaline Phosphatase: 83 U/L (ref 38–126)
Anion gap: 8 (ref 5–15)
BUN: 10 mg/dL (ref 8–23)
CO2: 31 mmol/L (ref 22–32)
Calcium: 8.4 mg/dL — ABNORMAL LOW (ref 8.9–10.3)
Chloride: 94 mmol/L — ABNORMAL LOW (ref 98–111)
Creatinine, Ser: 0.64 mg/dL (ref 0.61–1.24)
GFR, Estimated: >60 mL/min (ref >60)
Glucose, Bld: 110 mg/dL — ABNORMAL HIGH (ref 70–99)
Potassium: 4.1 mmol/L (ref 3.5–5.1)
Sodium: 133 mmol/L — ABNORMAL LOW (ref 135–145)
Total Bilirubin: 0.2 mg/dL — ABNORMAL LOW (ref 0.3–1.2)
Total Protein: 5.6 g/dL — ABNORMAL LOW (ref 6.5–8.1)

## 2021-02-13 LAB — CBC WITH DIFFERENTIAL/PLATELET
Abs Immature Granulocytes: 0.06 10*3/uL (ref 0.00–0.07)
Basophils Absolute: 0 10*3/uL (ref 0.0–0.1)
Basophils Relative: 0 %
Eosinophils Absolute: 0.2 10*3/uL (ref 0.0–0.5)
Eosinophils Relative: 3 %
HCT: 27.9 % — ABNORMAL LOW (ref 39.0–52.0)
Hemoglobin: 8.6 g/dL — ABNORMAL LOW (ref 13.0–17.0)
Immature Granulocytes: 1 %
Lymphocytes Relative: 10 %
Lymphs Abs: 0.9 10*3/uL (ref 0.7–4.0)
MCH: 27.5 pg (ref 26.0–34.0)
MCHC: 30.8 g/dL (ref 30.0–36.0)
MCV: 89.1 fL (ref 80.0–100.0)
Monocytes Absolute: 0.9 10*3/uL (ref 0.1–1.0)
Monocytes Relative: 11 %
Neutro Abs: 6.6 10*3/uL (ref 1.7–7.7)
Neutrophils Relative %: 75 %
Platelets: 377 10*3/uL (ref 150–400)
RBC: 3.13 MIL/uL — ABNORMAL LOW (ref 4.22–5.81)
RDW: 13.6 % (ref 11.5–15.5)
WBC: 8.7 10*3/uL (ref 4.0–10.5)
nRBC: 0 % (ref 0.0–0.2)

## 2021-02-13 LAB — C-REACTIVE PROTEIN: CRP: 10.5 mg/dL — ABNORMAL HIGH (ref <1.0)

## 2021-02-13 MED ORDER — CHLORHEXIDINE GLUCONATE CLOTH 2 % EX PADS
6.0000 | MEDICATED_PAD | Freq: Every day | CUTANEOUS | Status: DC
  Administered 2021-02-13 – 2021-02-15 (×3): 6 via TOPICAL

## 2021-02-13 NOTE — Progress Notes (Signed)
PROGRESS NOTE   Subjective/Complaints: Appreciate IR note, no pains, moderate result with Fleet enema  ROS: Denies CP, SOB, N/V/D  Objective:   No results found. Recent Labs    02/13/21 0439  WBC 8.7  HGB 8.6*  HCT 27.9*  PLT 377   Recent Labs    02/13/21 0439  NA 133*  K 4.1  CL 94*  CO2 31  GLUCOSE 110*  BUN 10  CREATININE 0.64  CALCIUM 8.4*    Intake/Output Summary (Last 24 hours) at 02/13/2021 0754 Last data filed at 02/13/2021 0500 Gross per 24 hour  Intake 428.27 ml  Output 800 ml  Net -371.73 ml         Physical Exam: Vital Signs Blood pressure 140/85, pulse 81, temperature 98.4 F (36.9 C), temperature source Oral, resp. rate 17, height 5\' 7"  (1.702 m), weight 109 kg, SpO2 96 %.  General: No acute distress Mood and affect are appropriate Heart: Regular rate and rhythm no rubs murmurs or extra sounds Lungs: Clear to auscultation, breathing unlabored, no rales or wheezes Abdomen: Positive bowel sounds, soft nontender to palpation, nondistended Extremities: No clubbing, cyanosis, or edema Skin: No evidence of breakdown, no evidence of rash  Motor: Bilateral upper extremities: 5/5 proximal distal Right lower extremity: Hip flexion, knee extension 4-/5, ankle dorsiflexion 3-/5, stable Left lower extremity: Hip flexion, knee extension 3/5, ankle dorsiflexion 2-/5    Assessment/Plan: 1. Functional deficits which require 3+ hours per day of interdisciplinary therapy in a comprehensive inpatient rehab setting. Physiatrist is providing close team supervision and 24 hour management of active medical problems listed below. Physiatrist and rehab team continue to assess barriers to discharge/monitor patient progress toward functional and medical goals  Care Tool:  Bathing    Body parts bathed by patient: Right arm, Left arm, Chest, Abdomen, Face   Body parts bathed by helper: Buttocks     Bathing  assist Assist Level: Minimal Assistance - Patient > 75%     Upper Body Dressing/Undressing Upper body dressing   What is the patient wearing?: Pull over shirt    Upper body assist Assist Level: Set up assist    Lower Body Dressing/Undressing Lower body dressing      What is the patient wearing?: Pants     Lower body assist Assist for lower body dressing: Minimal Assistance - Patient > 75%     Toileting Toileting    Toileting assist Assist for toileting: Moderate Assistance - Patient 50 - 74% Assistive Device Comment:  (urinal)   Transfers Chair/bed transfer  Transfers assist     Chair/bed transfer assist level: Contact Guard/Touching assist     Locomotion Ambulation   Ambulation assist      Assist level: Minimal Assistance - Patient > 75% Assistive device: Walker-rolling Max distance: 50 ft   Walk 10 feet activity   Assist     Assist level: Minimal Assistance - Patient > 75% Assistive device: Walker-rolling   Walk 50 feet activity   Assist    Assist level: Minimal Assistance - Patient > 75% Assistive device: Walker-rolling    Walk 150 feet activity   Assist Walk 150 feet activity did not occur: Safety/medical  concerns         Walk 10 feet on uneven surface  activity   Assist Walk 10 feet on uneven surfaces activity did not occur: Safety/medical concerns         Wheelchair     Assist Will patient use wheelchair at discharge?: Yes Type of Wheelchair: Manual    Wheelchair assist level: Supervision/Verbal cueing Max wheelchair distance: 250 ft    Wheelchair 50 feet with 2 turns activity    Assist        Assist Level: Supervision/Verbal cueing   Wheelchair 150 feet activity     Assist      Assist Level: Supervision/Verbal cueing   Blood pressure 140/85, pulse 81, temperature 98.4 F (36.9 C), temperature source Oral, resp. rate 17, height 5\' 7"  (1.702 m), weight 109 kg, SpO2 96 %.  Medical Problem  List and Plan: 1.   Debility secondary to sepsis due to E. Coli/ Left psoas abscess.S/Pwound debridement 7/19  Continue CIR PT, OT - ELOS 8/22 2.  Antithrombotics: -DVT/anticoagulation: Lovenox             -antiplatelet therapy: N/A 3. Pain Management: Oxycodone as needed  Controlled on 8/17 4. Mood: Provide emotional support             -antipsychotic agents: N/A 5. Neuropsych: This patient is capable of making decisions on his own behalf. 6. Skin/Wound Care: Routine skin checks- remove honeycomb in a few days.  7. Fluids/Electrolytes/Nutrition: Routine in and out 8. ID. Continue Cefazolin 2 gram every 8 hours through 03/11/2021 per infectious disease.   Drains to be removed?  8/20. Per IR still having a good amt of output   8/12- per ID, needs CT of spine, Chest, abd and pelvis 1st week of September as well as IV antibiotics til 9/14.   IR now rec CT next week prior to d/c  9.  Left knee pseudogout.  Status post aspiration.  Continue colchicine 10.  Acute on chronic anemia.  Follow-up CBC  Hemoglobin 8.8 on 8/12  Continue to monitor 11. Hyponatremia.    Sodium 133 on 8/12 Continue to monitor 12.  Morbid obesity:.10/12.Follow up dietery.  Encourage weight loss 13.  OSA.  BiPAP- pt says pressure too high and wears O2 instead.  14. Constipation Bowel meds increased again on 8/17, having small results will order fleets 15.  Leukocytosis  Resolved afeb  16.  Prediabetes  Monitor with increased mobility  8/15- last BG was 124 on BMP on 8/12- con't to monitor 17.  LLE weakness in HF ? Lumbar plexopathy vs radic, may benefit from OP EMG at Dr 10/12 office  LOS: 10 days A FACE TO FACE EVALUATION WAS PERFORMED  Fredrich Birks 02/13/2021, 7:54 AM

## 2021-02-13 NOTE — Progress Notes (Signed)
Physical Therapy Discharge Summary  Patient Details  Name: Marco Kennedy MRN: [id number] Date of Birth: 1950/10/13   Patient has met 4 of 7 long term goals due to improved activity tolerance, improved postural control, increased strength, ability to compensate for deficits, and improved coordination.  Patient to discharge at an ambulatory level Supervision.   Patient's care partner is independent to provide the necessary physical assistance at discharge. Pt to d/c home with his wife, supervision for transfer with RW, supervision with gait with RW, CGA-min A for stairs. Pt's wife participated in family education and demoed safe guarding technique throughout. Pt was unable to navigate stairs with single handrail to simulate lower set of stair in home environment, so pt and wife were advised to utilize back entrance until cleared by HHPT.   Reasons goals not met: Pt continues to have ongoing deficits in strength and endurance.  Recommendation:  Patient will benefit from ongoing skilled PT services in home health setting to continue to advance safe functional mobility, address ongoing impairments in strength, endurance, pain, functional mobiltiy, and minimize fall risk.  Equipment: W/c  Reasons for discharge: treatment goals met and discharge from hospital  Patient/family agrees with progress made and goals achieved: Yes  PT Discharge Precautions/Restrictions Precautions Precautions: Fall Precaution Comments: monitor SpO2; L drain Restrictions Weight Bearing Restrictions: No Vision/Perception  Perception Perception: Within Functional Limits Praxis Praxis: Intact  Cognition Overall Cognitive Status: Within Functional Limits for tasks assessed Arousal/Alertness: Awake/alert Orientation Level: Oriented X4 Attention: Focused;Sustained Focused Attention: Appears intact Sustained Attention: Appears intact Memory: Appears intact Safety/Judgment: Appears  intact Sensation Sensation Light Touch: Appears Intact Hot/Cold: Appears Intact Proprioception: Appears Intact Stereognosis: Appears Intact Coordination Gross Motor Movements are Fluid and Coordinated: No Coordination and Movement Description: grossly uncoordinated d/t fatigue, abdominal distention, and global weakness Motor  Motor Motor: Other (comment) Motor - Skilled Clinical Observations: limited by global endurance deficit and weakness Motor - Discharge Observations: Globally limited by endurance, abdominal distention, and arthritic knees, greatly improved from baseline  Mobility Bed Mobility Bed Mobility: Rolling Right;Right Sidelying to Sit;Sitting - Scoot to Edge of Bed Rolling Right: Independent with assistive device Right Sidelying to Sit: Supervision/Verbal cueing Sitting - Scoot to Edge of Bed: Supervision/Verbal cueing Transfers Transfers: Sit to Stand;Stand to Sit;Stand Pivot Transfers Sit to Stand: Supervision/Verbal cueing Stand to Sit: Supervision/Verbal cueing Stand Pivot Transfers: Supervision/Verbal cueing Transfer (Assistive device): Rolling walker Locomotion  Gait Ambulation: Yes Gait Assistance: Supervision/Verbal cueing Gait Distance (Feet): 200 Feet Assistive device: Rolling walker Gait Assistance Details: Tactile cues for posture Gait Gait: Yes Gait Pattern: Decreased step length - right;Decreased step length - left Gait velocity: reduced Stairs / Additional Locomotion Stairs: Yes Stairs Assistance: Contact Guard/Touching assist Stair Management Technique: Two rails Number of Stairs: 8 Height of Stairs: 6 Ramp: Contact Guard/touching assist Curb: Nurse, mental health Mobility: Yes Wheelchair Assistance: Set up Lexicographer: Both upper extremities Wheelchair Parts Management: Needs assistance Distance: 250  Trunk/Postural Assessment  Cervical Assessment Cervical Assessment: Within  Functional Limits Thoracic Assessment Thoracic Assessment: Within Functional Limits Lumbar Assessment Lumbar Assessment: Within Functional Limits Postural Control Postural Control: Deficits on evaluation  Balance Balance Balance Assessed: Yes Static Sitting Balance Static Sitting - Balance Support: Feet supported;Bilateral upper extremity supported Static Sitting - Level of Assistance: 6: Modified independent (Device/Increase time) Dynamic Sitting Balance Dynamic Sitting - Balance Support: Feet supported;During functional activity Dynamic Sitting - Level of Assistance: 5: Stand by assistance Dynamic Sitting - Balance Activities: Lateral lean/weight  shifting;Forward lean/weight shifting;Reaching for objects Static Standing Balance Static Standing - Balance Support: During functional activity;Bilateral upper extremity supported Static Standing - Level of Assistance: 5: Stand by assistance Dynamic Standing Balance Dynamic Standing - Balance Support: During functional activity;Bilateral upper extremity supported Dynamic Standing - Level of Assistance: 5: Stand by assistance Extremity Assessment  RLE Assessment General Strength Comments: Grossly 4/5 LLE Assessment General Strength Comments: Grossly 3/5    Mickel Fuchs, PT, DPT Breck Coons, PT, DPT 02/15/2021, 3:51 PM

## 2021-02-13 NOTE — Progress Notes (Signed)
Physical Therapy Session Note  Patient Details  Name: Marco Kennedy MRN: [id number] Date of Birth: 03/31/1951  Today's Date: 02/13/2021 PT Individual Time: 1400-1500 PT Individual Time Calculation (min): 60 min   Short Term Goals: Week 1:  PT Short Term Goal 1 (Week 1): Pt will navigate 3" stairs x 8 PT Short Term Goal 1 - Progress (Week 1): Met PT Short Term Goal 2 (Week 1): Pt will ambulate 200 ft with LRAD PT Short Term Goal 2 - Progress (Week 1): Met PT Short Term Goal 3 (Week 1): Pt will perform x 10 min exercise to improve functional endurance. PT Short Term Goal 3 - Progress (Week 1): Met Week 2:  PT Short Term Goal 2 (Week 2): =LTGs d/t ELOS  Skilled Therapeutic Interventions/Progress Updates:  Pt in recliner with his wife and nsg present to administer IV. No complaint of pain. Session focused on family education for mobility.Gait x 100 ft with CGA from pt's wife, therapist managed IV pole and line throughout session. Pt continues to demo kyphotic posture, educated that pt should not return to using rollator until he is able to maintain upright posture. Pt navigated stairs with BIL handrails 2 x 4 with both therapist and wife. Participated in problem solving for lower set of stairs with single handrail. Likely that using quad cane in L hand and R rail will be best option but pt lacks the strength at this time to use this strategy. Advised pt and wife that there best option is to use the back entrance with one step until pt is able to perform step ups with cane/rail and cleared by HHPT. Pt performed car transfer with mod A to lift LE into car, which pt reports is his baseline. Returned to pt room and completed education with requirements of supervision assist and need for supervision with exercise, as well as strategies for staying active and walking in the community. Both reported that they had no more questions and pt remained in recliner at end of session, was left with all needs in  reach and alarm active.   Therapy Documentation Precautions:  Precautions Precautions: Fall Precaution Comments: monitor SpO2; L drain Restrictions Weight Bearing Restrictions: No    Therapy/Group: Individual Therapy  Mickel Fuchs 02/13/2021, 4:00 PM

## 2021-02-13 NOTE — Progress Notes (Signed)
[name], male   DOB: December 01, 1950, 70 y.o.   MRN: [id number]  Met with pt and wife who is here for education in preparation for discharge Monday. Made aware helms coming out for RN and [facility] for therapies. Pam-Americtas here to educate wife on IV antibiotics when leaving. Pt reports will have a CT Monday to check prior to discharge home. See on Monday for any last minute questions. Wheelchair in room for wife to take home.

## 2021-02-13 NOTE — Progress Notes (Signed)
Occupational Therapy Session Note  Patient Details  Name: Marco Kennedy MRN: [id number] Date of Birth: 10-Mar-1951  Today's Date: 02/13/2021 OT Group Time: 1101-1201 OT Group Time Calculation (min): 60 min   Short Term Goals: Week 2:  OT Short Term Goal 1 (Week 2): STGs = LTGs d/t ELOS  Skilled Therapeutic Interventions/Progress Updates:  Pt participate in group session with a focus on therapeutic activity of Bowling to facilitate magagement of w/c, hand-eye- coordination, UB strength, social interaction, and increasing activity tolerance. Pt completed bowling turns from sitting in w/c with RUE, pt able to complete w/c mgmt with supervision, good management of remembering to lock brakes. Pt participating in problem solving tasks by recalling score and writing up pts own score on score board after each turn. Pt actively interacting with other group members by  providing encouragement to other group members and communicating with team mates about rules of activity. Pt transported back to room by RT.   Therapy Documentation Precautions:  Precautions Precautions: Fall Precaution Comments: monitor SpO2; L drain Restrictions Weight Bearing Restrictions: No    Pain: Pt reports no pain during session.   Therapy/Group: Group Therapy  [name] 02/13/2021, 2:54 PM

## 2021-02-13 NOTE — Plan of Care (Signed)
  Problem: Consults Goal: RH GENERAL PATIENT EDUCATION Description: See Patient Education module for education specifics. Outcome: Progressing Goal: Skin Care Protocol Initiated - if Braden Score 18 or less Description: If consults are not indicated, leave blank or document N/A Outcome: Progressing   Problem: RH SKIN INTEGRITY Goal: RH STG MAINTAIN SKIN INTEGRITY WITH ASSISTANCE Description: STG Maintain Skin Integrity With Supervision Assistance. Outcome: Progressing Goal: RH STG ABLE TO PERFORM INCISION/WOUND CARE W/ASSISTANCE Description: STG Able To Perform Incision/Wound Care With Supervision Assistance. Outcome: Progressing   Problem: RH SAFETY Goal: RH STG ADHERE TO SAFETY PRECAUTIONS W/ASSISTANCE/DEVICE Description: STG Adhere to Safety Precautions With Supervision Assistance/Device. Outcome: Progressing Goal: RH STG DECREASED RISK OF FALL WITH ASSISTANCE Description: STG Decreased Risk of Fall With Cues and Reminders. Outcome: Progressing   Problem: RH PAIN MANAGEMENT Goal: RH STG PAIN MANAGED AT OR BELOW PT'S PAIN GOAL Description: < 4 on a 0-10 pain scale. Outcome: Progressing   Problem: RH KNOWLEDGE DEFICIT GENERAL Goal: RH STG INCREASE KNOWLEDGE OF SELF CARE AFTER HOSPITALIZATION Description: Patient will demonstrate knowledge on medication management, pain management, skin/wound care with educational materials and handouts provided by staff independently at discharge. Outcome: Progressing   

## 2021-02-14 NOTE — Progress Notes (Signed)
Occupational Therapy Session Note  Patient Details  Name: Marco Kennedy MRN: [id number] Date of Birth: 1950/07/29  Today's Date: 02/15/2021 OT Individual Time: 7564-3329 OT Individual Time Calculation (min): 55 min   Short Term Goals: Week 2:  OT Short Term Goal 1 (Week 2): STGs = LTGs d/t ELOS  Skilled Therapeutic Interventions/Progress Updates:    Pt greeted in bed, reporting having some stomach discomfort. Agreeable to use the restroom at start of session. After OT donned Ted hose, pt donned his shoes with setup assistance. Close supervision for ambulatory transfer to the toilet using RW. Pt with BM void. Able to complete toileting tasks with assistance for hygiene only. Pt was able to reach buttocks and assist therapist today without AE! We celebrated! LB bathing/dressing was next completed w/c level at sink, sit<stand with RW. Pt only needed assistance to don his Ted stockings. Supervision for dynamic standing balance. He remained sitting at the sink to complete UB self care + shaving, provided with setup to do this. Pt aware to notify nursing staff when he was finished for transfer to the recliner after. Call bell placed near baseboard of bed and pt able to self propel w/c short distance to reach it. NTs made aware of pts position.   Therapy Documentation Precautions:  Precautions Precautions: Fall Precaution Comments: monitor SpO2; L drain Restrictions Weight Bearing Restrictions: No  Pain: no c/o pain during tx Pain Assessment Pain Scale: 0-10 Pain Score: 2  Pain Type: Acute pain Pain Location: Back Pain Orientation: Lower Pain Descriptors / Indicators: Aching Pain Onset: Gradual Patients Stated Pain Goal: 2 Pain Intervention(s): Pain med given for lower pain score than stated, per patient request Multiple Pain Sites: No ADL: ADL Eating: Independent Grooming: Setup Where Assessed-Grooming: Sitting at sink Upper Body Bathing: Setup Where Assessed-Upper Body Bathing:  Sitting at sink Lower Body Bathing: Supervision/safety Where Assessed-Lower Body Bathing: Sitting at sink, Standing at sink Upper Body Dressing: Setup Where Assessed-Upper Body Dressing: Sitting at sink Lower Body Dressing: Minimal assistance Where Assessed-Lower Body Dressing: Sitting at sink, Standing at sink Toileting: Minimal assistance Where Assessed-Toileting: Teacher, adult education: Close supervision Toilet Transfer Method: Ambulating (RW) Acupuncturist: Raised toilet seat Tub/Shower Transfer: Not assessed     Therapy/Group: Individual Therapy  Jahvon Gosline A Mikey Maffett 02/15/2021, 12:23 PM

## 2021-02-14 NOTE — Plan of Care (Signed)
  Problem: Consults Goal: RH GENERAL PATIENT EDUCATION Description: See Patient Education module for education specifics. Outcome: Progressing Goal: Skin Care Protocol Initiated - if Braden Score 18 or less Description: If consults are not indicated, leave blank or document N/A Outcome: Progressing   Problem: RH SKIN INTEGRITY Goal: RH STG MAINTAIN SKIN INTEGRITY WITH ASSISTANCE Description: STG Maintain Skin Integrity With Supervision Assistance. Outcome: Progressing Goal: RH STG ABLE TO PERFORM INCISION/WOUND CARE W/ASSISTANCE Description: STG Able To Perform Incision/Wound Care With Supervision Assistance. Outcome: Progressing   Problem: RH SAFETY Goal: RH STG ADHERE TO SAFETY PRECAUTIONS W/ASSISTANCE/DEVICE Description: STG Adhere to Safety Precautions With Supervision Assistance/Device. Outcome: Progressing Goal: RH STG DECREASED RISK OF FALL WITH ASSISTANCE Description: STG Decreased Risk of Fall With Cues and Reminders. Outcome: Progressing   Problem: RH PAIN MANAGEMENT Goal: RH STG PAIN MANAGED AT OR BELOW PT'S PAIN GOAL Description: < 4 on a 0-10 pain scale. Outcome: Progressing   Problem: RH KNOWLEDGE DEFICIT GENERAL Goal: RH STG INCREASE KNOWLEDGE OF SELF CARE AFTER HOSPITALIZATION Description: Patient will demonstrate knowledge on medication management, pain management, skin/wound care with educational materials and handouts provided by staff independently at discharge. Outcome: Progressing   

## 2021-02-14 NOTE — Progress Notes (Signed)
Occupational Therapy Discharge Summary  Patient Details  Name: Marco Kennedy MRN: [id number] Date of Birth: [birth year]  Patient has met 7 of 8 long term goals due to improved activity tolerance, improved balance, postural control, ability to compensate for deficits, and improved coordination.  Patient to discharge at overall Supervision level.  Patient's wife [name] is independent to provide the necessary physical assistance at discharge and has participated in hands on family education.    Pt still requires Min A for hygiene during toileting and therefore this goal was unable to be met. Pt reports his wife can assist with toileting at home  Recommendation:  Patient will benefit from ongoing skilled OT services in home health setting to continue to advance functional skills in the area of BADL and iADL.  Equipment: No equipment provided  Reasons for discharge: treatment goals met and discharge from hospital  Patient/family agrees with progress made and goals achieved: Yes  OT Discharge Precautions/Restrictions  Precautions Precautions: Fall General   Vital Signs Therapy Vitals Temp: 99.3 F (37.4 C) Pulse Rate: 81 Resp: 18 BP: (!) 147/79 Patient Position (if appropriate): Sitting Pain   ADL ADL Eating: Independent Grooming: Setup Where Assessed-Grooming: Sitting at sink Upper Body Bathing: Setup Where Assessed-Upper Body Bathing: Sitting at sink Lower Body Bathing: Supervision/safety Where Assessed-Lower Body Bathing: Sitting at sink, Standing at sink Upper Body Dressing: Setup Where Assessed-Upper Body Dressing: Sitting at sink Lower Body Dressing: Minimal assistance Where Assessed-Lower Body Dressing: Sitting at sink, Standing at sink Toileting: Minimal assistance Where Assessed-Toileting: Glass blower/designer: Close supervision Toilet Transfer Method: Ambulating (RW) Science writer: Raised toilet seat Tub/Shower Transfer: Not  assessed Vision Baseline Vision/History: Wears glasses Wears Glasses: At all times Patient Visual Report: No change from baseline Vision Assessment?: No apparent visual deficits Perception  Perception: Within Functional Limits Praxis Praxis: Intact Cognition Orientation Level: Oriented X4 Safety/Judgment: Appears intact Sensation Sensation Light Touch: Appears Intact Coordination Gross Motor Movements are Fluid and Coordinated: No Fine Motor Movements are Fluid and Coordinated: Yes Coordination and Movement Description: grossly uncoordinated d/t fatigue, abdominal distention, and global weakness Motor  Motor Motor: Other (comment) Motor - Discharge Observations: Globally limited by endurance, abdominal distention, and arthritic knees, greatly improved from baseline Mobility    Supervision for functional sit<stand and for dynamic standing balance during ADLs Trunk/Postural Assessment  Cervical Assessment Cervical Assessment: Within Functional Limits Lumbar Assessment Lumbar Assessment: Within Functional Limits Postural Control Postural Control: Deficits on evaluation (limited in standing, pt needing an assistive device for balance support during functional tasks + transfers)  Balance Balance Balance Assessed: Yes Dynamic Sitting Balance Dynamic Sitting - Balance Support: Feet supported;During functional activity;No upper extremity supported Dynamic Sitting - Level of Assistance: 5: Stand by assistance (clothing mgt during toileting) Extremity/Trunk Assessment RUE Assessment RUE Assessment: Within Functional Limits LUE Assessment LUE Assessment: Within Functional Limits   Therese Rocco A Marshal Eskew 02/15/2021, 8:55 AM

## 2021-02-14 NOTE — Progress Notes (Signed)
PROGRESS NOTE   Subjective/Complaints:  No issues overnite  Had large BM this am   ROS: Denies CP, SOB, N/V/D  Objective:   No results found. Recent Labs    02/13/21 0439  WBC 8.7  HGB 8.6*  HCT 27.9*  PLT 377    Recent Labs    02/13/21 0439  NA 133*  K 4.1  CL 94*  CO2 31  GLUCOSE 110*  BUN 10  CREATININE 0.64  CALCIUM 8.4*     Intake/Output Summary (Last 24 hours) at 02/14/2021 0954 Last data filed at 02/14/2021 0753 Gross per 24 hour  Intake 608.67 ml  Output 800 ml  Net -191.33 ml         Physical Exam: Vital Signs Blood pressure (!) 155/93, pulse 90, temperature 97.8 F (36.6 C), temperature source Oral, resp. rate 16, height 5\' 7"  (1.702 m), weight 107.8 kg, SpO2 96 %.  General: No acute distress Mood and affect are appropriate Heart: Regular rate and rhythm no rubs murmurs or extra sounds Lungs: Clear to auscultation, breathing unlabored, no rales or wheezes Abdomen: Positive bowel sounds, soft nontender to palpation, nondistended Extremities: No clubbing, cyanosis, or edema Skin: No evidence of breakdown, no evidence of rash  Motor: Bilateral upper extremities: 5/5 proximal distal Right lower extremity: Hip flexion, knee extension 4-/5, ankle dorsiflexion 3-/5, stable Left lower extremity: Hip flexion, knee extension 3/5, ankle dorsiflexion 2-/5    Assessment/Plan: 1. Functional deficits which require 3+ hours per day of interdisciplinary therapy in a comprehensive inpatient rehab setting. Physiatrist is providing close team supervision and 24 hour management of active medical problems listed below. Physiatrist and rehab team continue to assess barriers to discharge/monitor patient progress toward functional and medical goals  Care Tool:  Bathing    Body parts bathed by patient: Right arm, Left arm, Chest, Abdomen, Face   Body parts bathed by helper: Buttocks     Bathing assist  Assist Level: Minimal Assistance - Patient > 75%     Upper Body Dressing/Undressing Upper body dressing   What is the patient wearing?: Pull over shirt    Upper body assist Assist Level: Set up assist    Lower Body Dressing/Undressing Lower body dressing      What is the patient wearing?: Pants     Lower body assist Assist for lower body dressing: Minimal Assistance - Patient > 75%     Toileting Toileting    Toileting assist Assist for toileting: Moderate Assistance - Patient 50 - 74% Assistive Device Comment:  (urinal)   Transfers Chair/bed transfer  Transfers assist     Chair/bed transfer assist level: Supervision/Verbal cueing     Locomotion Ambulation   Ambulation assist      Assist level: Supervision/Verbal cueing Assistive device: Walker-rolling Max distance: 200 ft   Walk 10 feet activity   Assist     Assist level: Supervision/Verbal cueing Assistive device: Walker-rolling   Walk 50 feet activity   Assist    Assist level: Supervision/Verbal cueing Assistive device: Walker-rolling    Walk 150 feet activity   Assist Walk 150 feet activity did not occur: Safety/medical concerns  Assist level: Supervision/Verbal cueing Assistive device: Walker-rolling  Walk 10 feet on uneven surface  activity   Assist Walk 10 feet on uneven surfaces activity did not occur: Safety/medical concerns   Assist level: Contact Guard/Touching assist Assistive device: Photographer Will patient use wheelchair at discharge?: Yes Type of Wheelchair: Manual    Wheelchair assist level: Set up assist Max wheelchair distance: 250 ft    Wheelchair 50 feet with 2 turns activity    Assist        Assist Level: Independent   Wheelchair 150 feet activity     Assist      Assist Level: Independent   Blood pressure (!) 155/93, pulse 90, temperature 97.8 F (36.6 C), temperature source Oral, resp. rate 16,  height 5\' 7"  (1.702 m), weight 107.8 kg, SpO2 96 %.  Medical Problem List and Plan: 1.   Debility secondary to sepsis due to E. Coli/ Left psoas abscess.S/Pwound debridement 7/19  Continue CIR PT, OT - [facility] 8/22 2.  Antithrombotics: -DVT/anticoagulation: Lovenox             -antiplatelet therapy: N/A 3. Pain Management: Oxycodone as needed  Controlled on 8/17 4. Mood: Provide emotional support             -antipsychotic agents: N/A 5. Neuropsych: This patient is capable of making decisions on his own behalf. 6. Skin/Wound Care: Routine skin checks- remove honeycomb in a few days.  7. Fluids/Electrolytes/Nutrition: Routine in and out 8. ID. Continue Cefazolin 2 gram every 8 hours through 03/11/2021 per infectious disease.   Per IR still having a good amt of output , repeat CT abd/pelvis prior to drain removal     IR now rec CT prior to d/c  9.  Left knee pseudogout.  Status post aspiration.  Continue colchicine 10.  Acute on chronic anemia.  Follow-up CBC  Hemoglobin 8.8 on 8/12  Continue to monitor 11. Hyponatremia.    Sodium 133 on 8/12 Continue to monitor 12.  Morbid obesity:.10/12.Follow up dietery.  Encourage weight loss 13.  OSA.  BiPAP- pt says pressure too high and wears O2 instead.  14. Constipation Bowel meds increased again on 8/17,having results on daily Miralax and senna S 2 po BID  15.  Leukocytosis  Resolved afeb  16.  Prediabetes  Monitor with increased mobility  8/15- last BG was 124 on BMP on 8/12- con't to monitor 17.  LLE weakness in HF ? Lumbar plexopathy vs radic, may benefit from OP EMG at Dr 10/12 office  LOS: 11 days A FACE TO FACE EVALUATION WAS PERFORMED  Fredrich Birks 02/14/2021, 9:54 AM

## 2021-02-15 MED ORDER — AMLODIPINE BESYLATE 5 MG PO TABS
5.0000 mg | ORAL_TABLET | Freq: Every day | ORAL | Status: DC
  Administered 2021-02-16: 5 mg via ORAL
  Filled 2021-02-15: qty 1

## 2021-02-15 NOTE — Discharge Summary (Signed)
Physician Discharge Summary  [name] MRN: [id number] DOB/AGE: [birth year] 70 y.o.  Admit date: 02/03/2021 Discharge date: 02/16/2021  Discharge Diagnoses:  Principal Problem:   Vertebral osteomyelitis ([place]) Active Problems:   Obesity, Class III, BMI 40-49.9 (morbid obesity) ([place])   Sepsis due to Escherichia coli (E. coli) ([facility])   Sepsis ([place])   Abdominal fluid collection   Constipation by delayed colonic transit   Slow transit constipation   Hyponatremia   Acute on chronic anemia   Prediabetes   Debility   Discharged Condition: Stable  Significant Diagnostic Studies: DG Chest 1 View  Result Date: 01/20/2021 CLINICAL DATA:  Status post bilateral chest tube placement after spread of spinal infection to the chest. EXAM: CHEST  1 VIEW COMPARISON:  Single-view of the chest 01/10/2021. FINDINGS: Pigtail catheters are in place bilaterally. The patient also has a right PICC with its tip in the upper to mid superior vena cava. No pneumothorax. Mild basilar opacities and small bilateral pleural effusions noted. Heart size is normal. IMPRESSION: Bilateral chest tubes in place.  No pneumothorax. Small bilateral pleural effusions. Mild basilar airspace disease is likely atelectasis. Tip of right PICC projects in the upper to mid superior vena cava. Electronically Signed   By: Inge Rise M.D.   On: 01/20/2021 19:53   DG Abd 1 View  Result Date: 02/04/2021 CLINICAL DATA:  Constipation.  Abdominal distension and pain. EXAM: ABDOMEN - 1 VIEW COMPARISON:  April 18, 2020. FINDINGS: No abnormal bowel dilatation is noted. Mild to moderate amount of stool is seen throughout the colon. Drainage catheter is noted in left lower quadrant. Status post surgical posterior fusion of visualized lower thoracic and lumbar spine. IMPRESSION: Mild to moderate stool burden. No abnormal bowel dilatation is noted. Electronically Signed   By: Marijo Conception M.D.   On: 02/04/2021 10:25   MR  THORACIC SPINE W WO CONTRAST  Result Date: 01/28/2021 CLINICAL DATA:  Discitis-osteomyelitis EXAM: MRI THORACIC AND LUMBAR SPINE WITHOUT AND WITH CONTRAST TECHNIQUE: Multiplanar and multiecho pulse sequences of the thoracic and lumbar spine were obtained without and with intravenous contrast. CONTRAST:  82mL GADAVIST GADOBUTROL 1 MMOL/ML IV SOLN COMPARISON:  01/11/2021 FINDINGS: MRI THORACIC SPINE FINDINGS Alignment:  Physiologic. Vertebrae: Spinal fusion hardware begins at T10. More superior levels are normal. Cord: Normal signal and morphology. There is a ventral epidural collection that begins at the level of the T7-8 disc space and extends inferiorly to T10-11, unchanged in craniocaudal extent. AP thickness measures approximately 5 mm. Paraspinal and other soft tissues: Loculated bilateral pleural collections. Small amount of fluid adjacent to the left T12 spinal rod. The fluid at the right spinal rod has substantially resolved. Disc levels: There is abnormal disc signal at T9-10 and T10-11. The T10-11 finding is unchanged but the T9-10 abnormality is new. Circumferential dural thickening and enhancement at the T10 level effaces the thecal sac. This is poorly visualized on the prior study on multiple sequences, but appears to be unchanged based on the axial T2-weighted imaging. MRI LUMBAR SPINE FINDINGS Segmentation:  Standard Alignment:  Normal Vertebrae: Posterior fusion hardware extends inferiorly to the sacrum. There are interbody spacers at L4-5 and L5-S1. Conus medullaris: Extends to the L1-2 level and appears normal. Paraspinal and other soft tissues: Negative. Disc levels: Degenerative findings are unchanged since 01/11/2021 L1-2: Normal disc.  No stenosis. L2-3: Disc desiccation without spinal canal stenosis. L3-4: Posterior decompression no spinal canal stenosis. L4-5: Posterior decompression.  Mild left foraminal stenosis. L5-S1: No  disc herniation.  Mild left foraminal stenosis. IMPRESSION: 1.  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 Circumferential dural thickening at the T10 level again effaces the thecal sac with mass effect on the spinal cord. 2. New abnormal disc signal at T9-10 and unchanged abnormal signal T10-11, consistent with discitis-osteomyelitis. 3. No lumbar spinal canal stenosis. 4. Loculated bilateral pleural collections. Electronically Signed   By: Ulyses Jarred M.D.   On: 01/28/2021 23:33   MR Lumbar Spine W Wo Contrast  Result Date: 01/28/2021 CLINICAL DATA:  Discitis-osteomyelitis EXAM: MRI THORACIC AND LUMBAR SPINE WITHOUT AND WITH CONTRAST TECHNIQUE: Multiplanar and multiecho pulse sequences of the thoracic and lumbar spine were obtained without and with intravenous contrast. CONTRAST:  39m GADAVIST GADOBUTROL 1 MMOL/ML IV SOLN COMPARISON:  01/11/2021 FINDINGS: MRI THORACIC SPINE FINDINGS Alignment:  Physiologic. Vertebrae: Spinal fusion hardware begins at T10. More superior levels are normal. Cord: Normal signal and morphology. There is a ventral epidural collection that begins at the level of the T7-8 disc space and extends inferiorly to T10-11, unchanged in craniocaudal extent. AP thickness measures approximately 5 mm. Paraspinal and other soft tissues: Loculated bilateral pleural collections. Small amount of fluid adjacent to the left T12 spinal rod. The fluid at the right spinal rod has substantially resolved. Disc levels: There is abnormal disc signal at T9-10 and T10-11. The T10-11 finding is unchanged but the T9-10 abnormality is new. Circumferential dural thickening and enhancement at the T10 level effaces the thecal sac. This is poorly visualized on the prior study on multiple sequences, but appears to be unchanged based on the axial T2-weighted imaging. MRI LUMBAR SPINE FINDINGS Segmentation:  Standard Alignment:  Normal Vertebrae: Posterior fusion hardware extends inferiorly to the sacrum.  There are interbody spacers at L4-5 and L5-S1. Conus medullaris: Extends to the L1-2 level and appears normal. Paraspinal and other soft tissues: Negative. Disc levels: Degenerative findings are unchanged since 01/11/2021 L1-2: Normal disc.  No stenosis. L2-3: Disc desiccation without spinal canal stenosis. L3-4: Posterior decompression no spinal canal stenosis. L4-5: Posterior decompression.  Mild left foraminal stenosis. L5-S1: No disc herniation.  Mild left foraminal stenosis. IMPRESSION: 1. 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 Circumferential dural thickening at the T10 level again effaces the thecal sac with mass effect on the spinal cord. 2. New abnormal disc signal at T9-10 and unchanged abnormal signal T10-11, consistent with discitis-osteomyelitis. 3. No lumbar spinal canal stenosis. 4. Loculated bilateral pleural collections. Electronically Signed   By: KUlyses JarredM.D.   On: 01/28/2021 23:33   MR KNEE RIGHT W WO CONTRAST  Result Date: 01/30/2021 CLINICAL DATA:  Right knee pain for 3 days. EXAM: MRI OF THE RIGHT KNEE WITHOUT AND WITH CONTRAST TECHNIQUE: Multiplanar, multisequence MR imaging of the knee was performed before and after the administration of intravenous contrast. CONTRAST:  163mGADAVIST GADOBUTROL 1 MMOL/ML IV SOLN COMPARISON:  None. FINDINGS: Exam limited by patient motion. MENISCI Medial meniscus:  Degenerative changes without obvious tear. Lateral meniscus:  Probable chronic tear or prior meniscectomy. LIGAMENTS Cruciates:  Chronic ACL deficient knee.  PCL is intact. Collaterals:  Intact CARTILAGE Patellofemoral: Advanced degenerative chondrosis with joint space narrowing and spurring. Medial:  Moderate degenerative chondrosis. Lateral: Advanced degenerative chondrosis with full-thickness cartilage loss, joint space narrowing and spurring. Joint: There is a moderate to large joint effusion with moderate  irregular surrounding synovial enhancement. Certainly could not  exclude septic arthritis but this could also be non infectious synovitis. Recommend knee joint aspiration for further evaluation. Popliteal Fossa:  No popliteal mass or Baker's cyst. Extensor Mechanism: The patella retinacular structures are intact and the quadriceps and patellar tendons are intact. Bones: No findings suspicious for osteomyelitis. No marrow edema or abnormal enhancement. Other: Subcutaneous soft tissue swelling/edema could suggest cellulitis. There is also moderate edema like signal changes in the vastus medialis muscle and also in the posterior compartment muscles. Findings could suggest infectious myositis. No findings suspicious for pyomyositis. IMPRESSION: 1. Exam limited by patient motion. 2. Moderate to large joint effusion with moderate irregular surrounding synovial enhancement. Certainly could not exclude septic arthritis but this could also be non infectious synovitis. Recommend knee joint aspiration for further evaluation. 3. No findings suspicious for osteomyelitis. 4. Probable cellulitis and myositis without discrete soft tissue abscess or pyomyositis. 5. Chronic ACL deficient knee. 6. Tricompartmental degenerative changes most significant in the lateral. Electronically Signed   By: Marijo Sanes M.D.   On: 01/30/2021 20:18   CT CHEST ABDOMEN PELVIS W CONTRAST  Result Date: 01/26/2021 CLINICAL DATA:  Empyema, disseminated E coli infection question intra-abdominal abscess, sepsis secondary to discitis and osteomyelitis, E coli in an abscess near pelvic hardware, back pain EXAM: CT CHEST, ABDOMEN, AND PELVIS WITH CONTRAST TECHNIQUE: Multidetector CT imaging of the chest, abdomen and pelvis was performed following the standard protocol during bolus administration of intravenous contrast. Sagittal and coronal MPR images reconstructed from axial data set. CONTRAST:  151m OMNIPAQUE IOHEXOL 300 MG/ML SOLN IV. No oral contrast.  COMPARISON:  CT abdomen and pelvis 01/10/2021 FINDINGS: CT CHEST FINDINGS Cardiovascular: Minimal atherosclerotic calcification aorta without aneurysm. Heart unremarkable. No pericardial effusion. Pulmonary arteries grossly patent on nondedicated exam. Mediastinum/Nodes: No thoracic adenopathy. RIGHT arm PICC line with tip in SVC. Base of cervical region normal appearance. Esophagus unremarkable. Lungs/Pleura: Small loculated pleural fluid collections bilaterally RIGHT greater than LEFT which contain foci of air, could either represent empyema or prior instrumentation/aspiration. Compressive atelectasis in the lower lobes bilaterally. Loculated fluid versus less likely mass at medial RIGHT apex, recommend attention on follow-up imaging, 2.3 x 2.1 cm image 10, fluid attenuation. No infiltrate or additional mass identified. Musculoskeletal: Spinal fixation hardware T10-S2. Thoracic portion of hardware appears intact. Lucency seen surrounding pedicle screws at T10 adjacent to loculated pleural collection at the inferior RIGHT chest medially favor infected hardware. CT ABDOMEN PELVIS FINDINGS Hepatobiliary: Gallbladder and liver normal appearance Pancreas: Normal appearance Spleen: Normal appearance Adrenals/Urinary Tract: Adrenal glands, kidneys, ureters, and bladder normal appearance Stomach/Bowel: Normal appendix. Stomach and bowel loops normal appearance. Vascular/Lymphatic: Atherosclerotic calcifications aorta and iliac arteries without aneurysm. Normal sized retroperitoneal nodes. Reproductive: Unremarkable seminal vesicles and prostate gland Other: No free air or free fluid. Large fluid collection again identified LEFT retroperitoneal 10.1 x 6.4 x 7.1 cm, could represent infected or sterile collection. Trace free pelvic fluid. Scattered stranding of subcutaneous tissue planes. Musculoskeletal: Orthopedic hardware T10-S2. LEFT hip prosthesis. Probable bone island RIGHT femoral neck. IMPRESSION: Small loculated  pleural fluid collections bilaterally RIGHT greater than LEFT which contain foci of air, could either represent empyema or prior instrumentation/aspiration, slightly increased. Compressive atelectasis in the lower lobes. Loculated fluid versus less likely mass at medial RIGHT apex 2.3 x 2.1 cm; recommend attention on follow-up imaging. Large LEFT retroperitoneal fluid collection 10.1 x 6.4 x 7.1 cm little changed, could represent infected or sterile collection. Lucency surrounding the pedicle screws bilaterally at T10 favoring infection, question slightly increased on  RIGHT since previous study. Aortic Atherosclerosis (ICD10-I70.0). Electronically Signed   By: Lavonia Dana M.D.   On: 01/26/2021 11:40   [place] COMPLETE JOINT SPACE STRUCTURES LOW RIGHT  Result Date: 01/28/2021 CLINICAL DATA:  Swelling assess for knee effusion EXAM: ULTRASOUND right LOWER EXTREMITY LIMITED TECHNIQUE: Ultrasound examination of the lower extremity soft tissues was performed in the area of clinical concern. COMPARISON:  None. FINDINGS: Ultrasound evaluation of the right knee performed in the region of redness and swelling. Diffuse subcutaneous edema. Large complex fluid collection superior to the patella measuring 8.8 x 2.7 x 2.8 cm with estimated volume of 28 mL. IMPRESSION: Subcutaneous edema with large complex fluid collection at the anterior knee superior to the patella which may reflect infected or inflammatory fluid collection, or potentially hematoma. This would be better characterized by MRI. Electronically Signed   By: Donavan Foil M.D.   On: 01/28/2021 17:12   DG CHEST PORT 1 VIEW  Result Date: 01/23/2021 CLINICAL DATA:  Pleural effusion. EXAM: PORTABLE CHEST 1 VIEW COMPARISON:  01/22/2021 FINDINGS: A right chest tube is unchanged. The left chest tube has been removed. A right PICC remains in place. The cardiac silhouette remains mildly enlarged. Bibasilar opacities are unchanged and likely represent a combination of small  pleural effusions and atelectasis. No pneumothorax is identified. IMPRESSION: Interval left chest tube removal. Unchanged small pleural effusions and bibasilar atelectasis. No pneumothorax. Electronically Signed   By: Logan Bores M.D.   On: 01/23/2021 08:23   DG CHEST PORT 1 VIEW  Result Date: 01/22/2021 CLINICAL DATA:  Pleural effusion.  Bilateral chest tubes. EXAM: PORTABLE CHEST 1 VIEW COMPARISON:  01/20/2021 FINDINGS: Bilateral chest tubes in stable position. No pneumothorax. Right PICC line in stable position. Stable cardiomegaly. Persistent bibasilar atelectasis/infiltrates and small bilateral pleural effusions. Degenerative change thoracic spine. Prior thoracolumbar spine fusion. IMPRESSION: 1.  Bilateral chest tubes in stable position.  No pneumothorax. 2. Persistent bibasilar atelectasis/infiltrates and small bilateral pleural effusions. 3.  Stable cardiomegaly. Electronically Signed   By: Marcello Moores  Register   On: 01/22/2021 06:25   ECHOCARDIOGRAM COMPLETE  Result Date: 01/21/2021    ECHOCARDIOGRAM REPORT   Patient Name:   JASKARN SCHWEER Date of Exam: 01/21/2021 Medical Rec #:  [id number]        Height:       67.0 in Accession #:    [id number]       Weight:       269.4 lb Date of Birth:  13-Sep-1950        BSA:          2.295 m Patient Age:    35 years         BP:           144/73 mmHg Patient Gender: M                HR:           109 bpm. Exam Location:  Inpatient Procedure: 2D Echo, Color Doppler, Cardiac Doppler and Intracardiac            Opacification Agent Indications:    Endocarditis  History:        Patient has no prior history of Echocardiogram examinations.  Sonographer:    Bernadene Person RDCS Referring Phys: [place]  1. Left ventricular ejection fraction, by estimation, is 65 to 70%. The left ventricle has normal function. The left ventricle has no regional wall motion abnormalities. There is mild left ventricular hypertrophy.  Left ventricular diastolic  parameters were normal.  2. Right ventricular systolic function is normal. The right ventricular size is normal. Tricuspid regurgitation signal is inadequate for assessing PA pressure.  3. The mitral valve is normal in structure. No evidence of mitral valve regurgitation.  4. The aortic valve was not well visualized. Aortic valve regurgitation is not visualized. No aortic stenosis is present.  5. The inferior vena cava is normal in size with greater than 50% respiratory variability, suggesting right atrial pressure of 3 mmHg. Conclusion(s)/Recommendation(s): No vegetation seen. If high clinical suspicion for endocarditis, consider TEE. FINDINGS  Left Ventricle: Left ventricular ejection fraction, by estimation, is 65 to 70%. The left ventricle has normal function. The left ventricle has no regional wall motion abnormalities. Definity contrast agent was given IV to delineate the left ventricular  endocardial borders. The left ventricular internal cavity size was normal in size. There is mild left ventricular hypertrophy. Left ventricular diastolic parameters were normal. Right Ventricle: The right ventricular size is normal. No increase in right ventricular wall thickness. Right ventricular systolic function is normal. Tricuspid regurgitation signal is inadequate for assessing PA pressure. Left Atrium: Left atrial size was normal in size. Right Atrium: Right atrial size was normal in size. Pericardium: There is no evidence of pericardial effusion. Mitral Valve: The mitral valve is normal in structure. No evidence of mitral valve regurgitation. Tricuspid Valve: The tricuspid valve is normal in structure. Tricuspid valve regurgitation is trivial. Aortic Valve: The aortic valve was not well visualized. Aortic valve regurgitation is not visualized. No aortic stenosis is present. Pulmonic Valve: The pulmonic valve was not well visualized. Pulmonic valve regurgitation is not visualized. Aorta: The aortic root and ascending  aorta are structurally normal, with no evidence of dilitation. Venous: The inferior vena cava is normal in size with greater than 50% respiratory variability, suggesting right atrial pressure of 3 mmHg. IAS/Shunts: The interatrial septum was not well visualized.  LEFT VENTRICLE PLAX 2D LVIDd:         4.80 cm  Diastology LVIDs:         2.80 cm  LV e' medial:    8.58 cm/s LV PW:         1.30 cm  LV E/e' medial:  8.6 LV IVS:        1.20 cm  LV e' lateral:   10.40 cm/s LVOT diam:     2.30 cm  LV E/e' lateral: 7.1 LV SV:         73 LV SV Index:   32 LVOT Area:     4.15 cm  RIGHT VENTRICLE RV S prime:     14.50 cm/s TAPSE (M-mode): 2.3 cm LEFT ATRIUM             Index       RIGHT ATRIUM           Index LA diam:        4.00 cm 1.74 cm/m  RA Area:     15.30 cm LA Vol (A2C):   43.1 ml 18.78 ml/m RA Volume:   31.10 ml  13.55 ml/m LA Vol (A4C):   38.6 ml 16.82 ml/m LA Biplane Vol: 42.9 ml 18.69 ml/m  AORTIC VALVE LVOT Vmax:   130.50 cm/s LVOT Vmean:  86.650 cm/s LVOT VTI:    0.175 m  AORTA Ao Root diam: 3.40 cm Ao Asc diam:  3.70 cm MITRAL VALVE MV Area (PHT): 4.44 cm     SHUNTS MV Decel Time: 171  msec     Systemic VTI:  0.18 m MV E velocity: 73.50 cm/s   Systemic Diam: 2.30 cm MV A velocity: 125.00 cm/s MV E/A ratio:  0.59 Oswaldo Milian MD Electronically signed by Oswaldo Milian MD Signature Date/Time: 01/21/2021/3:27:40 PM    Final    CT IMAGE GUIDED DRAINAGE BY PERCUTANEOUS CATHETER  Result Date: 01/28/2021 INDICATION: 70 year old male status post back surgery with left retroperitoneal collection. EXAM: CT IMAGE GUIDED DRAINAGE BY PERCUTANEOUS CATHETER COMPARISON:  CT chest abdomen pelvis, 01/26/2021 MEDICATIONS: The patient is currently admitted to the hospital and receiving intravenous antibiotics. The antibiotics were administered within an appropriate time frame prior to the initiation of the procedure. ANESTHESIA/SEDATION: Moderate (conscious) sedation was employed during this procedure. A total  of Versed 2 mg and Fentanyl 100 mcg was administered intravenously. Moderate Sedation Time: 23 minutes. The patient's level of consciousness and vital signs were monitored continuously by radiology nursing throughout the procedure under my direct supervision. CONTRAST:  None COMPLICATIONS: None immediate. PROCEDURE: Informed written consent was obtained from the patient after a discussion of the risks, benefits and alternatives to treatment. The patient was placed supine on the CT gantry and a pre procedural CT was performed re-demonstrating the known abscess/fluid collection within the left retroperitoneum. The procedure was planned. A timeout was performed prior to the initiation of the procedure. The left lower quadrant and flank was prepped and draped in the usual sterile fashion. The overlying soft tissues were anesthetized with 1% lidocaine with epinephrine. Appropriate trajectory was planned with the use of a 22 gauge spinal needle. An 18 gauge trocar needle was advanced into the abscess/fluid collection and a short Amplatz super stiff wire was coiled within the collection. Appropriate positioning was confirmed with a limited CT scan. The tract was serially dilated allowing placement of a 12 [place] all-purpose drainage catheter. Appropriate positioning was confirmed with a limited postprocedural CT scan. Forty ml of urine-colored fluid was aspirated. The tube was connected to a drainage bag and sutured in place. A dressing was placed. The patient tolerated the procedure well without immediate post procedural complication. IMPRESSION: Successful CT guided placement of a 1 French all purpose drain catheter into the left retroperitoneum with aspiration of 40 mL of urine-colored fluid. Samples were sent to the laboratory as requested by the ordering clinical team. Michaelle Birks, MD [facility] Electronically Signed   By: Michaelle Birks MD   On: 01/28/2021  16:17    Labs:  Basic Metabolic Panel: Recent Labs  Lab 02/13/21 0439  NA 133*  K 4.1  CL 94*  CO2 31  GLUCOSE 110*  BUN 10  CREATININE 0.64  CALCIUM 8.4*    CBC: Recent Labs  Lab 02/13/21 0439  WBC 8.7  NEUTROABS 6.6  HGB 8.6*  HCT 27.9*  MCV 89.1  PLT 377    CBG: No results for input(s): GLUCAP in the last 168 hours.  Family history.  Mother with hypertension.  Father with stomach cancer.  Brother with hypertension.  Denies any colon cancer esophageal cancer or rectal cancer It Brief HPI:   Marco Kennedy is a 70 y.o. right-handed male with history of obesity with BMI of 41.88 back surgery with pelvis fixation 03/2020, OSA on BiPAP and recent admission 01/10/2021 - 01/16/2021 for sepsis secondary to moderate size abscess near the pelvic hardware.  Neurosurgery interventional radiology and infectious disease were involved in his care at that time underwent wound debridement with neurosurgery on 7/19  with E. coli from wound cultures.  He was given cefazolin with plan of 6 weeks of treatment.  After patient was discharged to home noted bilateral rib pain needing to sleep in a lift chair.  Wife noted some increased swelling lower extremities and 10 pound weight gain.  Home health nurse 7/25 to assess the PICC line recommended follow-up PCP they decided to try telehealth by that time patient was noted to have some coughing and nonspecific chest discomfort as well as poor appetite.  He developed low-grade fever and chills.  Admitted 01/20/2021 with chemistries unremarkable except sodium 134 glucose 162 sedimentation rate 70 procalcitonin 1.01 blood cultures currently no growth to date gram stain no organisms seen MRSA nondetected hemoglobin 8.9 WBC 29,000.  Patient was hypoxic 88% on room air.  CT completed showing empyema and also discitis/osteomyelitis of T10.  Patient did undergo bilateral pigtail catheters placed in pleural cavities.  Echocardiogram with ejection fraction of 65 to  70% no wall motion abnormalities.  On 01/24/2021 right pleural catheter removed he did undergo IR placed left JP drain 01/27/2021.  Neurosurgery follow-up in regards to osteomyelitis no current plan for repeat surgical intervention.  Infectious disease follow-up maintained on cefazolin until 03/11/2021 most recent blood cultures negative.  Hospital course complicated by left knee pseudogout right knee was aspirated 44 cc of clear fluid and steroid injected most recent cultures negative.  Patient maintained on Lovenox for DVT prophylaxis.  Leukocytosis improved to 12,900 and monitored.  His hemoglobin remained stable at 9.0.  Hyponatremia 127-132 with urine osmolality 441.  Therapy evaluations completed due to patient decreased functional mobility was admitted for a comprehensive rehab program.   Hospital Course: DAXTYN ROTTENBERG was admitted to rehab 02/03/2021 for inpatient therapies to consist of PT, ST and OT at least three hours five days a week. Past admission physiatrist, therapy team and rehab RN have worked together to provide customized collaborative inpatient rehab.  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  He remained on Lovenox for DVT prophylaxis oxycodone for pain management.  Infectious disease continued cefazolin 2 g every 8 hours through 03/11/2021 per infectious disease.  JP drain remained in place followed by interventional radiology.  Repeat CT abdomen prior to drain removal 02/16/2021 showed left pelvic drain in the left iliac fossa had effectively drained nearly all the previously noted left side pelvic fluid collection.  It was the thoughts of interventional radiology the tube remained in place the next 1 to 2 days and follow-up in the clinic.Marland Kitchen  Left knee pseudogout status post aspiration continue colchicine.  Acute on chronic anemia 8.8 and monitored.  Sodium maintained 133 and was currently on 1200 mL fluid restriction.  Close monitoring of blood pressure mild elevations 150/86  low-dose Norvasc was added and patient would need outpatient follow-up with PCP.  Morbid obesity BMI 41.88 with dietary follow-up.  OSA BiPAP.  Constipation with laxative assistance.  Prediabetes monitoring of increased mobility.   Blood pressures were monitored on TID basis and soft and monitored     Rehab course: During patient's stay in rehab weekly team conferences were held to monitor patient's progress, set goals and discuss barriers to discharge. At admission, patient required moderate assist sit to side-lying minimal assist 60 feet rolling walker minimal assist upper body bathing max assist lower body assistance  Physical exam.  Blood pressure 134/73 pulse 70 temperature 98.9 respirations 18 oxygen saturation 94% room air Constitutional.  No acute distress HEENT Head.  Normocephalic and atraumatic Eyes.  Pupils round  and reactive to light without nystagmus Neck.  Supple nontender no JVD without thyromegaly Cardiac regular rate rhythm not extra sounds or murmur heard Abdomen.  Soft nontender positive bowel sounds without rebound.  Has a drain left lower quadrant/lateral aspect of abdomen Respiratory effort normal no respiratory distress without wheeze Skin.  A small area of peeled skin MS AD?  Inner buttock on right. Neurologic.  Alert oriented follows commands Right lower extremity hip flexion 4+/5 knee extension 5 -/5 dorsiflexion 3+/5 in plantarflexion 4/5 Left lower extremity hip flexion 4 -/5 knee extension 5 -/5 dorsiflexion 2+/5 plantarflexion 4 -/5  He/She  has had improvement in activity tolerance, balance, postural control as well as ability to compensate for deficits. He/She has had improvement in functional use RUE/LUE  and RLE/LLE as well as improvement in awareness.  Sessions focused on family education mobility ambulating 100 feet contact-guard assist.  Navigate stairs bilateral rails.  Participated in problem solving for lower set of stairs with single handrail.   Patient discharged overall supervision level for ADLs.  Full family teaching completed plan discharged home       Disposition: Discharged to home    Diet: Regular/1200 mL fluid restriction  Special Instructions: No driving smoking or alcohol  Continue cefazolin 2 g every 8 hours through 03/11/2021   Continue JP drain until follow-up with interventional radiology  Medications at discharge 1.  Tylenol as needed 2.  Cefazolin 2 g every 8 hours through 03/11/2021 and stop 3.  Colchicine 0.6 mg p.o. daily 4.  Multivitamin daily 5.  Oxycodone 5 mg every 6 hours as needed pain 6.  Lactulose as needed 7.  Norvasc.  5 mg daily  30-35 minutes were spent completing discharge summary and discharge planning  Discharge Instructions     AMB referral to rehabilitation   Complete by: As directed    [facility] pharmacist to adjust dose for Vancomycin, Aminoglycosides and other anti-infective therapies as requested by physician.   Complete by: As directed    [facility] to provide Cath Flo 32m   Complete by: As directed    Administer for PICC line occlusion and as ordered by physician for other access device issues.   Ambulatory referral to Physical Medicine Rehab   Complete by: As directed    Moderate complexity follow-up 1 to 2 weeks vertebral osteomyelitis   Anaphylaxis Kit: Provided to treat any anaphylactic reaction to the medication being provided to the patient if First Dose or when requested by physician   Complete by: As directed    Epinephrine 168mml vial / amp: Administer 0.72m30m0.72ml78mubcutaneously once for moderate to severe anaphylaxis, nurse to call physician and pharmacy when reaction occurs and call 911 if needed for immediate care   Diphenhydramine 50mg31mIV vial: Administer 25-50mg 40mM PRN for first dose reaction, rash, itching, mild reaction, nurse to call physician and pharmacy when reaction occurs   Sodium Chloride 0.9% NS 500ml I70mdminister  if needed for hypovolemic blood pressure drop or as ordered by physician after call to physician with anaphylactic reaction   Change dressing on IV access line weekly and PRN   Complete by: As directed    Flush IV access with Sodium Chloride 0.9% and Heparin 10 units/ml or 100 units/ml   Complete by: As directed    Home infusion instructions - [facility]   Complete by: As directed    Instructions: Flush IV access with Sodium Chloride 0.9% and Heparin 10units/ml or 100units/ml  Change dressing on IV access line: Weekly and PRN   Instructions Cath Flo 70m: Administer for PICC Line occlusion and as ordered by physician for other access device   [facility] pharmacist to adjust dose for: Vancomycin, Aminoglycosides and other anti-infective therapies as requested by physician   Method of administration may be changed at the discretion of home infusion pharmacist based upon assessment of the patient and/or caregiver's ability to self-administer the medication ordered   Complete by: As directed    Outpatient Parenteral Antibiotic Therapy Information Antibiotic: Cefazolin (Ancef) IVPB; Indications for use: osteomyelitis; End Date: 03/11/2021   Complete by: As directed    Antibiotic: Cefazolin (Ancef) IVPB   Indications for use: osteomyelitis   End Date: 03/11/2021         Follow-up Information     Lovorn, MJinny Blossom MD Follow up.   Specialty: Physical Medicine and Rehabilitation Why: No formal follow-up needed Contact information: [phone]         SErline Levine MD Follow up.   Specialty: Neurosurgery Why: Call for appointment Contact information: [phone]         MMichaelle Birks MD Follow up.   Specialties: Interventional Radiology, Diagnostic Radiology, Radiology Why: Call for appointment as needed Contact information: [address], [place] [phone]        VJabier Mutton MD Follow up.   Specialty: Infectious Diseases Why: Call for appointment Contact information: [address] [phone]                Signed: DCathlyn Parsons8/22/2022, 5:23 AM

## 2021-02-15 NOTE — Plan of Care (Signed)
  Problem: RH Toilet Transfers Goal: LTG Patient will perform toilet transfers w/assist (OT) Description: LTG: Patient will perform toilet transfers with assist, with/without cues using equipment (OT) Outcome: Not Met (add Reason) Note: Pt still requires Min A for hygiene post BM, he reports his wife can assist as needed at home   Problem: RH Balance Goal: LTG Patient will maintain dynamic standing with ADLs (OT) Description: LTG:  Patient will maintain dynamic standing balance with assist during activities of daily living (OT)  Outcome: Completed/Met   Problem: Sit to Stand Goal: LTG:  Patient will perform sit to stand in prep for activites of daily living with assistance level (OT) Description: LTG:  Patient will perform sit to stand in prep for activites of daily living with assistance level (OT) Outcome: Completed/Met   Problem: RH Grooming Goal: LTG Patient will perform grooming w/assist,cues/equip (OT) Description: LTG: Patient will perform grooming with assist, with/without cues using equipment (OT) Outcome: Completed/Met   Problem: RH Bathing Goal: LTG Patient will bathe all body parts with assist levels (OT) Description: LTG: Patient will bathe all body parts with assist levels (OT) Outcome: Completed/Met   Problem: RH Dressing Goal: LTG Patient will perform upper body dressing (OT) Description: LTG Patient will perform upper body dressing with assist, with/without cues (OT). Outcome: Completed/Met Goal: LTG Patient will perform lower body dressing w/assist (OT) Description: LTG: Patient will perform lower body dressing with assist, with/without cues in positioning using equipment (OT) Outcome: Completed/Met   Problem: RH Toileting Goal: LTG Patient will perform toileting task (3/3 steps) with assistance level (OT) Description: LTG: Patient will perform toileting task (3/3 steps) with assistance level (OT)  Outcome: Completed/Met

## 2021-02-15 NOTE — Progress Notes (Signed)
Physical Therapy Session Note  Patient Details  Name: Marco Kennedy MRN: [id number] Date of Birth: 03/29/1951  Today's Date: 02/15/2021 PT Individual Time: 1001-1100 PT Individual Time Calculation (min): 59 min   Short Term Goals: Week 2:  PT Short Term Goal 2 (Week 2): =LTGs d/t ELOS  Skilled Therapeutic Interventions/Progress Updates:     Pt received seated in WC and agrees to therapy. Reports pain in abdomen from "bloating". PT provides education and rest breaks to manage pain. PT provides minA for donning knee sleeves prior to mobility. WC transport to gym for time management. Pt performs sit to stand with close supervision and RW, then ambulates x190' with cues for upright gaze to improve posture and balance. Following extended seated rest break, pt attempts stairs, trialing use of quad cane in L hand and using R hand rail, to simulate home setup. Pt ascends 2 steps with noted configuration, requiring minA for safety and with significant difficulty clearing step with L lower extremity. Pt then performs using both hand rails, and requires CGA with much easier time clearing steps with L foot. PT notes that pt may not be weight shifting adequately to the R when using cane to clear step with L foot. Following extended seated rest break, pt trials again using cane and is able to ascend 2 steps but cannot clear 3rd step with L foot. PT providing CGA. PT educates on potential of ascending stairs backward and pt agreeable to try. Pt attempts ascending stairs backward and is unable to clear L foot from ground. PT discusses safety with stairs at home and pt reports that wife can help pt manage L leg if necessary. WC transport back to room. Stand step transfer back to bed with close supervision and cues for sequencing. Left seated with alarm intact and all needs within reach.   Therapy Documentation Precautions:  Precautions Precautions: Fall Precaution Comments: monitor SpO2; L  drain Restrictions Weight Bearing Restrictions: No    Therapy/Group: Individual Therapy  Beau Fanny, PT, DPT 02/15/2021, 3:49 PM

## 2021-02-15 NOTE — Plan of Care (Signed)
  Problem: Consults Goal: RH GENERAL PATIENT EDUCATION Description: See Patient Education module for education specifics. Outcome: Progressing Goal: Skin Care Protocol Initiated - if Braden Score 18 or less Description: If consults are not indicated, leave blank or document N/A Outcome: Progressing   Problem: RH SKIN INTEGRITY Goal: RH STG MAINTAIN SKIN INTEGRITY WITH ASSISTANCE Description: STG Maintain Skin Integrity With Supervision Assistance. Outcome: Progressing Goal: RH STG ABLE TO PERFORM INCISION/WOUND CARE W/ASSISTANCE Description: STG Able To Perform Incision/Wound Care With Supervision Assistance. Outcome: Progressing   Problem: RH SAFETY Goal: RH STG ADHERE TO SAFETY PRECAUTIONS W/ASSISTANCE/DEVICE Description: STG Adhere to Safety Precautions With Supervision Assistance/Device. Outcome: Progressing Goal: RH STG DECREASED RISK OF FALL WITH ASSISTANCE Description: STG Decreased Risk of Fall With Cues and Reminders. Outcome: Progressing   Problem: RH PAIN MANAGEMENT Goal: RH STG PAIN MANAGED AT OR BELOW PT'S PAIN GOAL Description: < 4 on a 0-10 pain scale. Outcome: Progressing   Problem: RH KNOWLEDGE DEFICIT GENERAL Goal: RH STG INCREASE KNOWLEDGE OF SELF CARE AFTER HOSPITALIZATION Description: Patient will demonstrate knowledge on medication management, pain management, skin/wound care with educational materials and handouts provided by staff independently at discharge. Outcome: Progressing

## 2021-02-16 ENCOUNTER — Inpatient Hospital Stay (HOSPITAL_COMMUNITY): Payer: BC Managed Care – PPO

## 2021-02-16 LAB — CREATININE, FLUID (PLEURAL, PERITONEAL, JP DRAINAGE): Creat, Fluid: 0.8 mg/dL

## 2021-02-16 IMAGING — CT CT ABD-PELV W/ CM
2 of 6 series · 14 of 46 positions shown, 16 images · IV contrast (APPLIED)
Comparison: CT the abdomen and pelvis [DATE].

CLINICAL DATA: 70-year-old male with history of left
retroperitoneal drain.

EXAM:
CT ABDOMEN AND PELVIS WITH CONTRAST
TECHNIQUE: Multidetector CT imaging of the abdomen and pelvis was performed
using the standard protocol following bolus administration of
intravenous contrast.
CONTRAST:  75mL OMNIPAQUE IOHEXOL 350 MG/ML SOLN

[Series 9: coronal soft tissue · coronal · 0.95mm/px · 3 of 102 slices shown]
[im 34/102  soft-tissue]
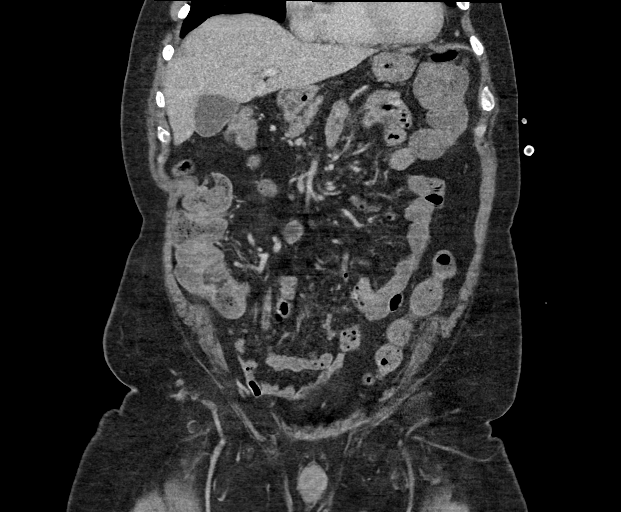
[im 45/102  soft-tissue]
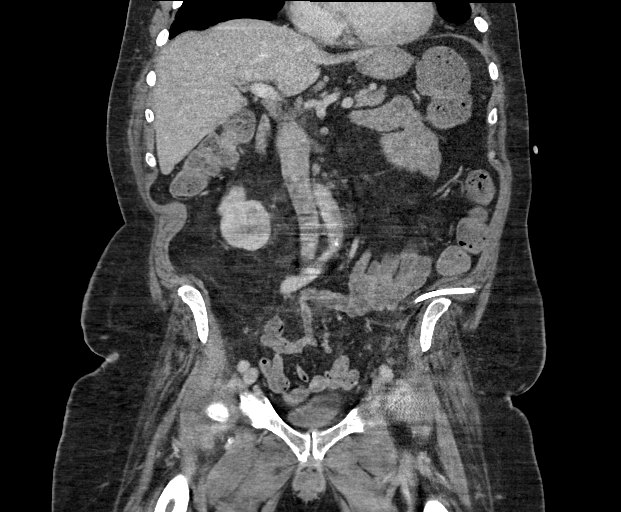
[im 57/102  soft-tissue]
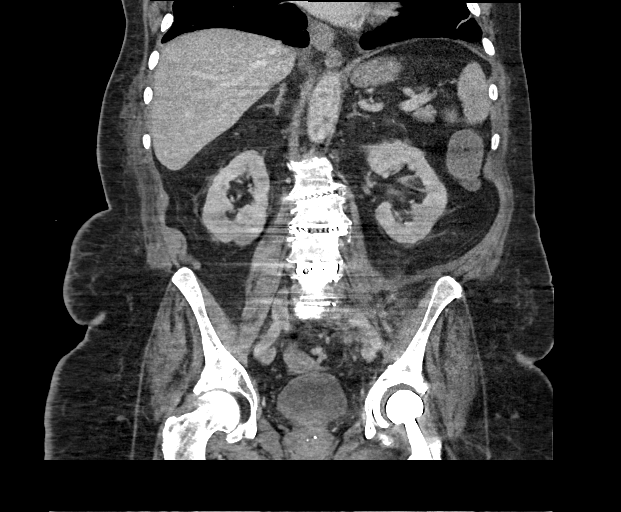

[Series 11: abd/ pelvis 5.0 i30f 2 (person_name) · axial · 0.98mm/px · z∈[+888,+1288]mm · 11 of 98 slices shown, 13 images]
[im 9/98  soft-tissue]
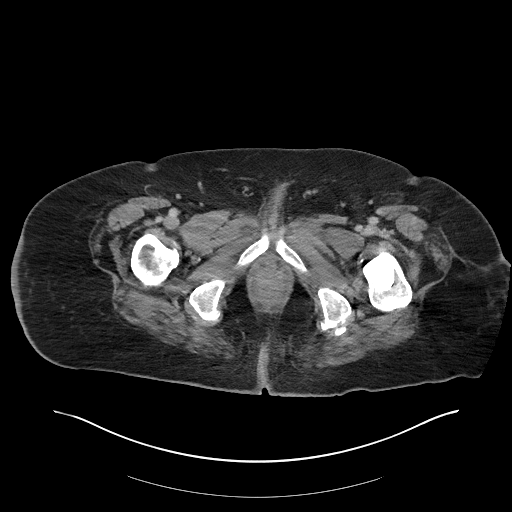
[im 9/98  bone]
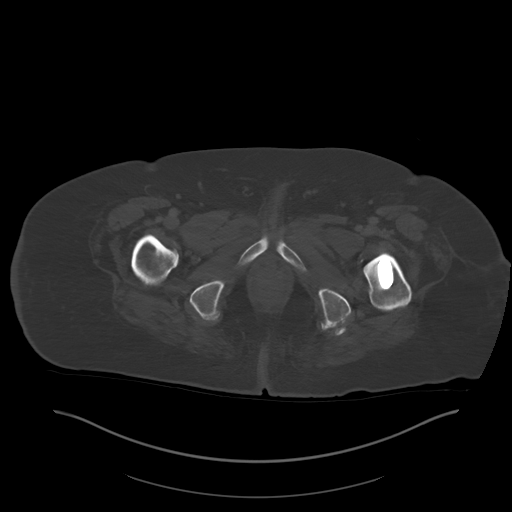
[im 17/98  soft-tissue]
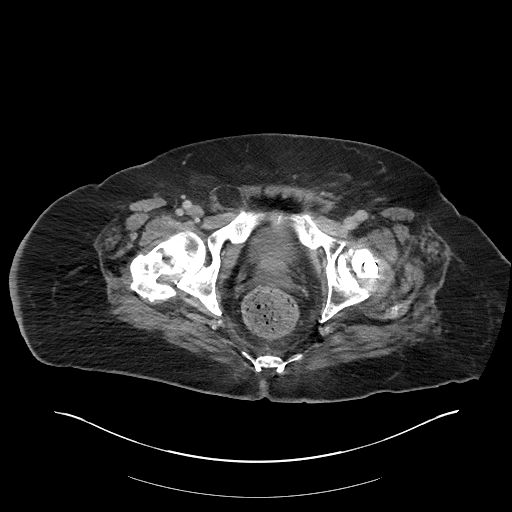
[im 25/98  soft-tissue]
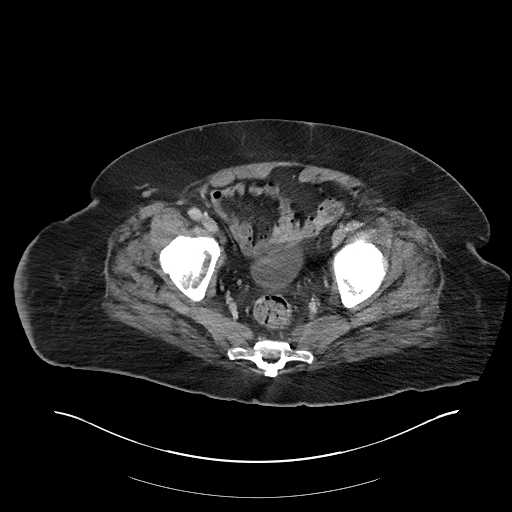
[im 33/98  soft-tissue]
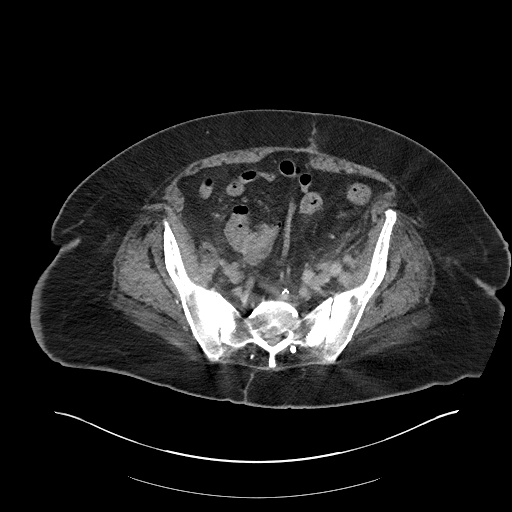
[im 41/98  soft-tissue]
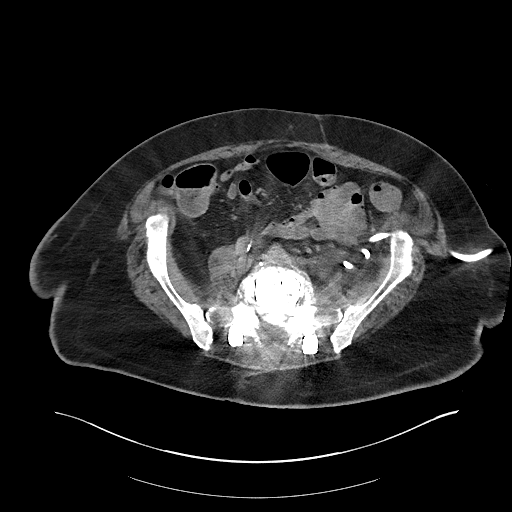
[im 49/98  soft-tissue]
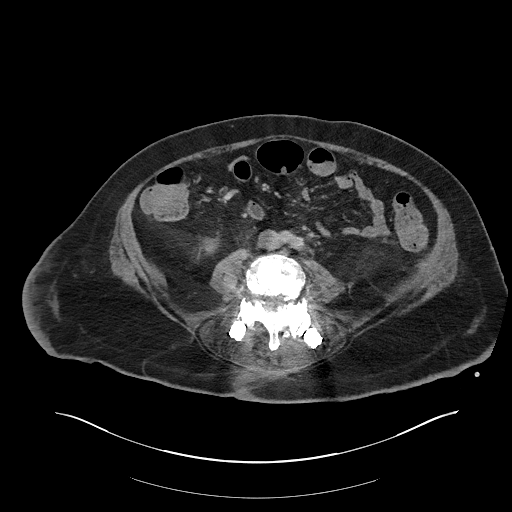
[im 57/98  soft-tissue]
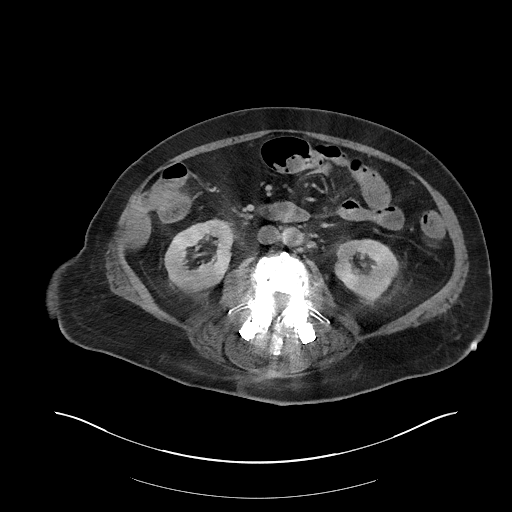
[im 65/98  soft-tissue]
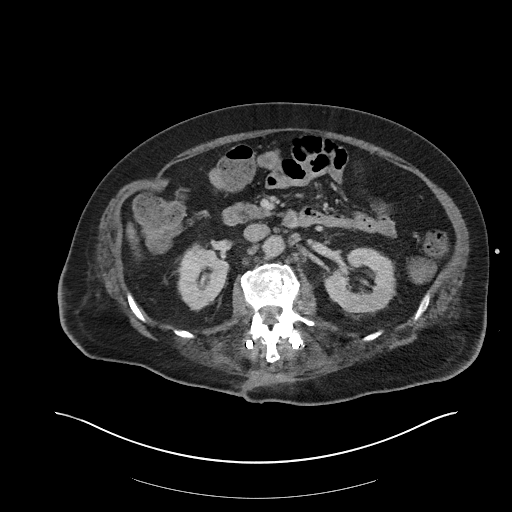
[im 73/98  soft-tissue]
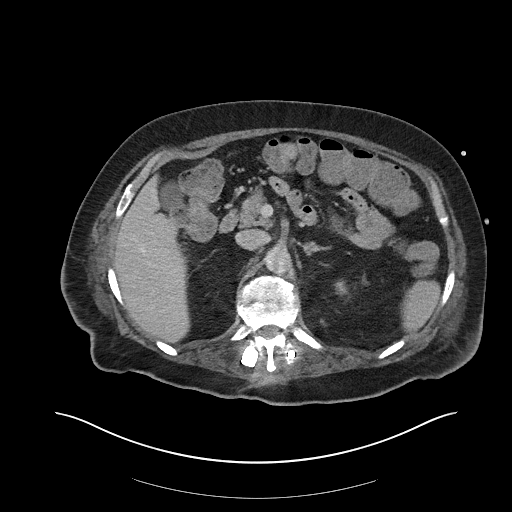
[im 73/98  bone]
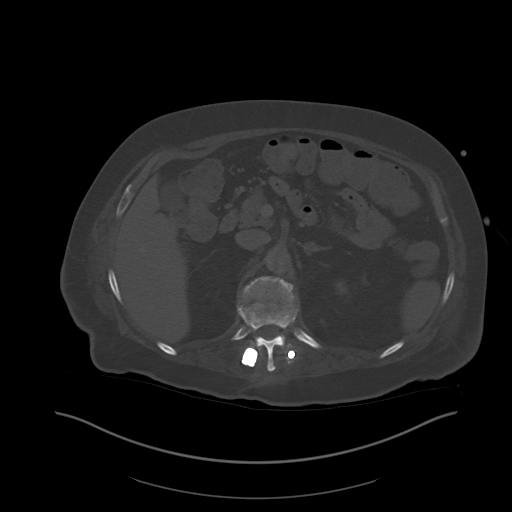
[im 81/98  soft-tissue]
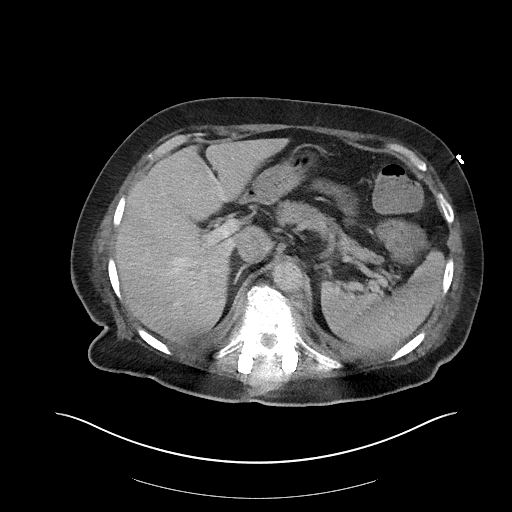
[im 89/98  soft-tissue]
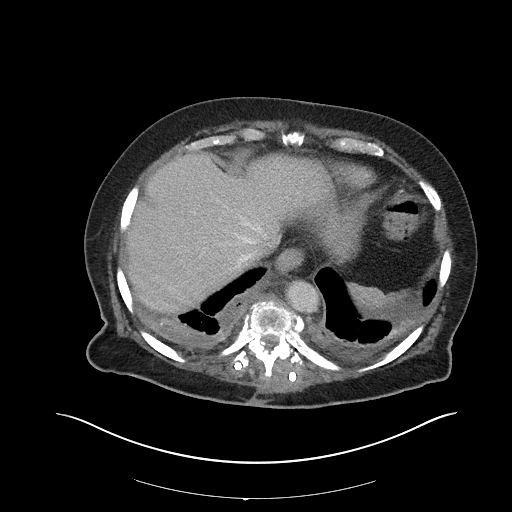

[14 of 46 positions shown; findings below may reference images not displayed]

FINDINGS: Lower chest: Small bilateral pleural effusions are noted
dependently. In the medial aspect of the lower right hemithorax
there is a loculated component of this effusion which has internal
locules of gas, compatible with residual empyema. Small amount of
pleural thickening and enhancement noted in the lower right
hemithorax. No definite pleural enhancement noted in the lower left
hemithorax. Overall, pleural effusions are smaller than the prior
examination. Some associated dependent subsegmental atelectasis in
the lower lobes of the lungs bilaterally.

Hepatobiliary: No suspicious cystic or solid hepatic lesions. The
appearance of the gallbladder is normal.

Pancreas: No pancreatic mass. No pancreatic ductal dilatation. No
pancreatic or peripancreatic fluid collections or inflammatory
changes.

Spleen: Unremarkable.

Adrenals/Urinary Tract: Subcentimeter low-attenuation lesion in the
anterior aspect of the interpolar region of the right kidney, too
small to characterize, but statistically likely to represent a tiny
cyst. Left kidney and bilateral adrenal glands are normal in
appearance. No hydroureteronephrosis. Urinary bladder is normal in
appearance.

Stomach/Bowel: The appearance of the stomach is normal. There is no
pathologic dilatation of small bowel or colon. Normal appendix.

Vascular/Lymphatic: Aortic atherosclerosis, without evidence of
aneurysm or dissection in the abdominal or pelvic vasculature.
Multiple prominent borderline enlarged retroperitoneal lymph nodes
are noted, likely reactive. No definite pathologically enlarged
lymph nodes are noted in the abdomen or pelvis.

Reproductive: Prostate gland and seminal vesicles are unremarkable
in appearance.

Other: There has been interval placement of a pigtail drainage
catheter in the left hemipelvis in the left iliac fossa. Previously
noted left-sided pelvic fluid collection has substantially decreased
in size, with only a small amount of residual fluid predominantly
adjacent to the medial aspect of the drain, currently measuring
approximately 3.9 x 1.9 cm (axial image 62 of series 3). No
significant volume of ascites. No pneumoperitoneum.

Musculoskeletal: Orthopedic rod and screw fixation hardware is noted
from T10-S2 with persistent lucency adjacent to the fixation screws
bilaterally at T10, similar to the prior examination. Interbody
cages are present at L2-L3 and L3-L4. ALIF noted at L4-L5 and L5-S1.
Status post left hip arthroplasty. There are no aggressive appearing
lytic or blastic lesions noted in the visualized portions of the
skeleton.
IMPRESSION: 1. Left pelvic drain in the left iliac fossa has effectively drained
nearly all of the previously noted left-sided pelvic fluid
collection, as discussed above.
2. Persistent bilateral pleural effusions. On the left, this is
simple in appearance on today's examination. On the right, there is
persistent loculation and persistent gas and pleural
thickening/enhancement, indicative of residual empyema.
3. Aortic atherosclerosis.
4. Additional incidental findings, as above.

## 2021-02-16 MED ORDER — LACTULOSE 10 GM/15ML PO SOLN: 30.0000 g | Freq: Every day | ORAL | Status: DC | PRN

## 2021-02-16 MED ORDER — AMLODIPINE BESYLATE 5 MG PO TABS: 5.0000 mg | ORAL_TABLET | Freq: Every day | ORAL | 0 refills | Status: DC

## 2021-02-16 MED ORDER — LACTULOSE 10 GM/15ML PO SOLN: 30.0000 g | Freq: Every day | ORAL | 0 refills | Status: DC | PRN

## 2021-02-16 MED ORDER — VITAMIN D 25 MCG (1000 UNIT) PO TABS: 1000.0000 [IU] | ORAL_TABLET | Freq: Every day | ORAL | 0 refills | Status: DC

## 2021-02-16 MED ORDER — SENNOSIDES-DOCUSATE SODIUM 8.6-50 MG PO TABS: 2.0000 | ORAL_TABLET | Freq: Two times a day (BID) | ORAL | Status: DC

## 2021-02-16 MED ORDER — IOHEXOL 350 MG/ML SOLN
75.0000 mL | Freq: Once | INTRAVENOUS | Status: AC | PRN
  Administered 2021-02-16: 75 mL via INTRAVENOUS

## 2021-02-16 MED ORDER — OXYCODONE HCL 5 MG PO TABS: 5.0000 mg | ORAL_TABLET | Freq: Four times a day (QID) | ORAL | 0 refills | Status: DC | PRN

## 2021-02-16 MED ORDER — CEFAZOLIN SODIUM-DEXTROSE 2-4 GM/100ML-% IV SOLN: 2.0000 g | Freq: Three times a day (TID) | INTRAVENOUS | Status: DC

## 2021-02-16 MED ORDER — COLCHICINE 0.6 MG PO TABS: 0.6000 mg | ORAL_TABLET | Freq: Every day | ORAL | 0 refills | Status: DC

## 2021-02-16 NOTE — Progress Notes (Signed)
Inpatient Rehabilitation Care Coordinator Discharge Note   Patient Details  Name: Marco Kennedy MRN: [id number] Date of Birth: [birth year]   Discharge location: HOME WITH WIFE WHO CAN PROVIDE 24/7 CARE  Length of Stay:    Discharge activity level: SUPERVISION-MIN LEVEL  Home/community participation: YES  Patient response JZ:PHXTAV Literacy - How often do you need to have someone help you when you read instructions, pamphlets, or other written material from your doctor or pharmacy?: Never  Patient response WP:VXYIAX Isolation - How often do you feel lonely or isolated from those around you?: Never  Services provided included: MD, RD, PT, OT, RN, CM, Pharmacy, SW  Financial Services:  Financial Services Utilized: Higher education careers adviser) BCBS  Choices offered to/list presented to: PT AND WIFE  Follow-up services arranged:  Home Health, DME, Patient/Family request agency HH/DME Home Health Agency: [facility]   [facility]    DME : [facility] HH/DME Requested Agency: [facility] BEFORE  Patient response to transportation need: Is the patient able to respond to transportation needs?: Yes In the past 12 months, has lack of transportation kept you from medical appointments or from getting medications?: No In the past 12 months, has lack of transportation kept you from meetings, work, or from getting things needed for daily living?: No    Comments (or additional information): NONE  Patient/Family verbalized understanding of follow-up arrangements:  Yes  Individual responsible for coordination of the follow-up plan: KATHY-WIFE [phone]  Confirmed correct DME delivered: Lucy Chris 02/16/2021    Atlee Villers, Lemar Livings

## 2021-02-16 NOTE — Progress Notes (Signed)
Inpatient Rehabilitation Care Coordinator Discharge Note   Patient Details  Name: Marco Kennedy MRN: [id number] Date of Birth: 07-12-1950   Discharge location:  HOME WITH WIFE WHO CAN PROVIDE 24/7 SUPERVISION  Length of Stay:  13 DAYS  Discharge activity level:  SUPERVISION-MIN LEVEL  Home/community participation:  YES  Patient response to: READY FOR DC WIFE IN FOR EDUCATION AND FEELS COMFORTABLE WITH PLAN  Patient response to: SEE ABOVE    Financial Services: BCBS AND MEDICAID PART A ONLY      Choices offered to/list presented to:  PT AND WIFE  Follow-up services arranged: [facility] WILL CALL TO SET UP. [facility] FOR IV ANTIOBIOTICS. PREFERS BOTH DUE TO HAS HAD BEFORE             Patient response to transportation need:    NO CONCERNS WIFE CAN PROVIDE TRANSPORT      Comments (or additional information):NONE  Patient/Family verbalized understanding of follow-up arrangements:   YES  Individual responsible for coordination of the follow-up plan:  KATHY-WIFE [phone]  Confirmed correct DME delivered: Lucy Chris 02/16/2021    Wrenly Lauritsen, Lemar Livings

## 2021-02-16 NOTE — Progress Notes (Signed)
PA Dan in with pt/family to discuss discharge instructions. Pt/family in agreement. SW updated about pt discharging with drain and ABX. Belongings gathered. Pt left per wheelchair to private vehicle with PICC line in place. No complications noted.  Mylo Red, LPN

## 2021-02-24 ENCOUNTER — Encounter: Payer: Self-pay | Admitting: *Deleted

## 2021-02-24 ENCOUNTER — Ambulatory Visit
Admission: RE | Admit: 2021-02-24 | Discharge: 2021-02-24 | Disposition: A | Discharge status: Home / Self Care | Payer: BC Managed Care – PPO | Source: Ambulatory Visit | Attending: Radiology | Admitting: Radiology

## 2021-02-24 DIAGNOSIS — R188 Other ascites: Secondary | ICD-10-CM

## 2021-02-24 HISTORY — PX: IR RADIOLOGIST EVAL & MGMT: IMG5224

## 2021-02-24 NOTE — Progress Notes (Addendum)
Referring Physician(s): Allred,Darrell K  Chief Complaint: The patient is seen in follow up today s/p left retroperitoneal abscess aspiration with drain placement 01/27/21  History of present illness:  Marco Kennedy, 70 year old male, has a medical history significant for OSA, scoliosis, spondylolisthesis, lumbar radiculopathy and lumbar spinal stenosis s/p anterior L2-3/L3-4/L4-5/L5-S1 fusion 04/18/20 with Dr. Vertell Limber. His post-op period was complicated by a posterior lumbar subcutaneous fluid collection which was drained in IR 01/11/21 with cultures positive for a rare e.coli. He then underwent thoracolumbar wound debridement in the OR with neurosurgery on 01/13/21. These cultures were also positive for rare e.coli. He was discharged home 01/16/21 on outpatient IV antibiotics.   He soon returned to the hospital 01/19/21 with a myriad of complaints and was found to have bilateral pleural effusions and a large left retroperitoneal fluid collection. He underwent bilateral chest tube placements 01/20/21 with CCM (left tube removed 7/29, right tube removed 7/30) and was seen in IR 01/27/21 for a left retroperitoneal abscess aspiration with drain placement.   He was discharged from the hospital 02/03/21 and spent a few weeks with the inpatient rehabilitation team. Prior to his rehab discharge 02/16/21 a repeat CT of the abdomen/pelvis showed the left pelvic drain had effectively drained nearly all the fluid collection.  Marco Kennedy presents today to the [facility] Radiology outpatient clinic for a drain evaluation and is accompanied by his wife. He denies any pain or discomfort and has been afebrile. He reports scant daily drain output.   Past Medical History:  Diagnosis Date   Arthritis    arthritis-bilateral knees, left Hip.   Degenerative lumbar spinal stenosis    EKG abnormality    06-08-16- being evaluated by cardiologist  in [place]   Idiopathic scoliosis of lumbar region    Joint stiffness     Knee pain    Leg swelling    Low back pain    Achy, shooting pain.  Hurts to Walk or stand up straight.   Lumbar radiculopathy    Scoliosis concern    Sleep apnea    Bipap use nightly 25/19 -3l/m oxygen   Spondylolisthesis of lumbar region    Transfusion history    [place] ,[place] after bleeding post colon polyp removal and colonscopy procedure    Past Surgical History:  Procedure Laterality Date   ABDOMINAL EXPOSURE N/A 04/18/2020   Procedure: ABDOMINAL EXPOSURE;  Surgeon: Marty Heck, MD;  Location: [place];  Service: Vascular;  Laterality: N/A;   ANTERIOR LAT LUMBAR FUSION Left 04/18/2020   Procedure: ANTERIOR LATERAL LUMBAR FUSION LUMBAR TWO-THREE, LUMBAR THREE-FOUR;  Surgeon: Erline Levine, MD;  Location: [place];  Service: Neurosurgery;  Laterality: Left;   ANTERIOR LUMBAR FUSION N/A 04/18/2020   Procedure: Lumbar Four-Five Lumbar Five- Sacral One Anterior lumbar Interbody Fusion;  Surgeon: Erline Levine, MD;  Location: [place];  Service: Neurosurgery;  Laterality: N/A;   APPLICATION OF INTRAOPERATIVE CT SCAN N/A 04/21/2020   Procedure: APPLICATION OF INTRAOPERATIVE CT SCAN;  Surgeon: Erline Levine, MD;  Location: [place];  Service: Neurosurgery;  Laterality: N/A;   BACK SURGERY     lumbar disc surgery   COLONOSCOPY W/ POLYPECTOMY     HERNIA REPAIR Left    left inguinal hernia repair   KNEE ARTHROSCOPY Right 2003    -scope for meniscus   LUMBAR WOUND DEBRIDEMENT N/A 01/13/2021   Procedure: THORACOLUMBAR WOUND DEBRIDEMENT;  Surgeon: Erline Levine, MD;  Location: [place];  Service: Neurosurgery;  Laterality: N/A;   POSTERIOR LUMBAR FUSION  4 LEVEL N/A 04/21/2020   Procedure: Thoracic ten to pelvis pedicle screw fixation with osteotomies;  Surgeon: Erline Levine, MD;  Location: [place];  Service: Neurosurgery;  Laterality: N/A;   TOTAL HIP ARTHROPLASTY Left 06/15/2016   Procedure: LEFT TOTAL HIP ARTHROPLASTY ANTERIOR APPROACH;  Surgeon: Paralee Cancel, MD;  Location: [facility];  Service:  Orthopedics;  Laterality: Left;    Allergies: Patient has no known allergies.  Medications: Prior to Admission medications   Medication Sig Start Date End Date Taking? Authorizing Provider  acetaminophen (TYLENOL) 325 MG tablet Take 2 tablets (650 mg total) by mouth every 6 (six) hours as needed for mild pain (or Fever >/= 101). 02/03/21   Debbe Odea, MD  amLODipine (NORVASC) 5 MG tablet Take 1 tablet (5 mg total) by mouth daily. 02/16/21   Angiulli, Lavon Paganini, PA-C  ceFAZolin (ANCEF) 2-4 GM/100ML-% IVPB Inject 100 mLs (2 g total) into the vein every 8 (eight) hours. 02/16/21   Angiulli, Lavon Paganini, PA-C  ceFAZolin (ANCEF) IVPB Inject 2 g into the vein every 8 (eight) hours. Indication:  abscess and empyema First Dose: Yes Last Day of Therapy:  03/11/21 Labs - Once weekly:  CBC/D and BMP, Labs - Every other week:  ESR and CRP Method of administration: IV Push Method of administration may be changed at the discretion of home infusion pharmacist based upon assessment of the patient and/or caregiver's ability to self-administer the medication ordered. 02/12/21 03/18/21  Angiulli, Lavon Paganini, PA-C  cholecalciferol (VITAMIN D3) 25 MCG (1000 UNIT) tablet Take 1 tablet (1,000 Units total) by mouth daily. 02/16/21   Angiulli, Lavon Paganini, PA-C  colchicine 0.6 MG tablet Take 1 tablet (0.6 mg total) by mouth daily. 02/16/21   Angiulli, Lavon Paganini, PA-C  lactulose (CHRONULAC) 10 GM/15ML solution Take 45 mLs (30 g total) by mouth daily as needed for mild constipation. 02/16/21   Angiulli, Lavon Paganini, PA-C  Multiple Vitamin (MULTIVITAMIN WITH MINERALS) TABS tablet Take 1 tablet by mouth daily.    [provider]  oxyCODONE (OXY IR/ROXICODONE) 5 MG immediate release tablet Take 1 tablet (5 mg total) by mouth every 6 (six) hours as needed for moderate pain. 02/16/21   Angiulli, Lavon Paganini, PA-C  polyethylene glycol (MIRALAX / GLYCOLAX) 17 g packet Take 17 g by mouth 2 (two) times daily. 01/15/21   Little Ishikawa,  MD  senna-docusate (SENOKOT-S) 8.6-50 MG tablet Take 2 tablets by mouth 2 (two) times daily. 02/16/21   Angiulli, Lavon Paganini, PA-C     Family History  Problem Relation Age of Onset   Hypertension Mother    Arthritis Mother    Stomach cancer Father    Hypertension Brother     Social History   Socioeconomic History   Marital status: Married    Spouse name: Not on file   Number of children: 2   Years of education: Not on file   Highest education level: Not on file  Occupational History   Not on file  Tobacco Use   Smoking status: Never   Smokeless tobacco: Never  Vaping Use   Vaping Use: Never used  Substance and Sexual Activity   Alcohol use: Yes    Comment: rare- social    Drug use: No   Sexual activity: Yes  Other Topics Concern   Not on file  Social History Narrative   Not on file   Social Determinants of Health   Financial Resource Strain: Not on file  Food Insecurity: Not on file  Transportation Needs: Not on file  Physical Activity: Not on file  Stress: Not on file  Social Connections: Not on file     Vital Signs: BP (!) 141/88 (BP Location: Left Arm)   Pulse 100   SpO2 96%   Physical Exam Constitutional:      General: He is not in acute distress. Pulmonary:     Effort: Pulmonary effort is normal.  Abdominal:     Tenderness: There is no abdominal tenderness.     Comments: Left retroperitoneal drain to gravity. Scant serous material in bag. Dressing is clean and dry. Suture and stat-lock intact.   Musculoskeletal:     Comments: Wheelchair   Skin:    General: Skin is warm and dry.  Neurological:     Mental Status: He is alert and oriented to person, place, and time.    Imaging: No results found.  Labs:  CBC: Recent Labs    02/01/21 0152 02/04/21 0425 02/06/21 0506 02/13/21 0439  WBC 12.9* 11.7* 11.9* 8.7  HGB 9.0* 9.0* 8.8* 8.6*  HCT 28.4* 28.7* 28.7* 27.9*  PLT 499* 489* 435* 377    COAGS: Recent Labs    01/31/21 0500  INR  1.3*    BMP: Recent Labs    02/01/21 0152 02/04/21 0425 02/06/21 0506 02/13/21 0439  NA 132* 132* 133* 133*  K 4.2 4.1 4.0 4.1  CL 92* 91* 91* 94*  CO2 34* 33* 33* 31  GLUCOSE 140* 132* 124* 110*  BUN 16 14 12 10   CALCIUM 7.9* 8.1* 8.5* 8.4*  CREATININE 0.80 0.71 0.67 0.64  GFRNONAA >60 >60 >60 >60    LIVER FUNCTION TESTS: Recent Labs    02/01/21 0152 02/04/21 0425 02/06/21 0506 02/13/21 0439  BILITOT 0.7 0.4 0.3 0.2*  AST 73* 49* 47* 25  ALT 34 27 22 12   ALKPHOS 107 90 91 83  PROT 5.0* 5.4* 5.5* 5.6*  ALBUMIN 1.5* 1.7* 1.8* 2.0*    Assessment:  History of spinal fusion with post-op left retroperitoneal abscess s/p aspiration with drain placement 01/27/21  CT imaging at rehab discharge (02/16/21) showed almost complete resolution of the fluid collection and the patient reports scant daily output. He is afebrile and denies any pain or discomfort. The pigtail drainage catheter was removed without incident. The patient and his wife know to expect some drainage from the site for the next few days and to keep the site covered with gauze until the drainage stops. He was instructed to wait 24 hours before showering and to wait 7 days before submerging the site in water. The patient and his wife were encouraged to notify the clinic if they have any redness, swelling, pain or increased drainage from the site.   Signed: Theresa Duty, NP 02/24/2021, 3:09 PM   Please refer to Dr. Malachi Carl attestation of this note for management and plan.

## 2021-02-26 ENCOUNTER — Encounter: Payer: Self-pay | Admitting: Physical Medicine & Rehabilitation

## 2021-02-26 ENCOUNTER — Other Ambulatory Visit: Payer: Self-pay

## 2021-02-26 ENCOUNTER — Encounter: Payer: BC Managed Care – PPO | Attending: Registered Nurse | Admitting: Physical Medicine & Rehabilitation

## 2021-02-26 VITALS — BP 123/88 | HR 116 | Temp 98.3°F | Ht 67.0 in | Wt 234.0 lb

## 2021-02-26 DIAGNOSIS — R5381 Other malaise: Secondary | ICD-10-CM | POA: Diagnosis not present

## 2021-02-26 DIAGNOSIS — M4620 Osteomyelitis of vertebra, site unspecified: Secondary | ICD-10-CM | POA: Diagnosis not present

## 2021-02-26 DIAGNOSIS — K5901 Slow transit constipation: Secondary | ICD-10-CM

## 2021-02-26 NOTE — Progress Notes (Signed)
Subjective:    [name], male    DOB: [birth year], 70 y.o.   MRN: [id number]  HPI Right-handed male with history of obesity with BMI of 41.88 back surgery with pelvis fixation 03/2020, OSA on BiPAP and recent admission 01/10/2021 - 01/16/2021 for sepsis secondary to moderate size abscess near the pelvic hardware presents for hospital follow up after receiving CIR for debility.   Admit date: 02/03/2021 Discharge date: 02/16/2021   At discharge he was instructed to follow up with therapies. Family supplements history. His drain has been removed.  He is awaiting phone call for Neurosurg follow up.  He sees ID next week. He is on IV abx. Bowel movements are not regular with meds. Had a fall on his knee when it "gave out". He complain of a knot on his back as well as left leg numbness after prolonged car ride.   Therapies: 2/week Mobility: Walker in the house DME: Previously possessed   Pain Inventory Average Pain 3 Pain Right Now 4 My pain is constant and aching  In the last 24 hours, has pain interfered with the following? General activity 7 Relation with others 8 Enjoyment of life 8 What TIME of day is your pain at its worst? morning , daytime, evening, and night Sleep (in general) Poor  Pain is worse with: walking and standing Pain improves with:  Soft chair Relief from Meds:  No taking pain meds  MOBILITY Walker in use Walks 4 minutes Not able to climb stairs Not able to drive Uses a wheelchair Needs help to transfer  NEURO / PSYCH Bowel control problem, weakness, trouble walking   Family History  Problem Relation Age of Onset   Hypertension Mother    Arthritis Mother    Stomach cancer Father    Hypertension Brother    Social History   Socioeconomic History   Marital status: Married    Spouse name: Not on file   Number of children: 2   Years of education: Not on file   Highest education level: Not on file  Occupational History   Not on file   Tobacco Use   Smoking status: Never   Smokeless tobacco: Never  Vaping Use   Vaping Use: Never used  Substance and Sexual Activity   Alcohol use: Not Currently    Comment: rare- social    Drug use: No   Sexual activity: Yes  Other Topics Concern   Not on file  Social History Narrative   Not on file   Social Determinants of Health   Financial Resource Strain: Not on file  Food Insecurity: Not on file  Transportation Needs: Not on file  Physical Activity: Not on file  Stress: Not on file  Social Connections: Not on file   Past Surgical History:  Procedure Laterality Date   ABDOMINAL EXPOSURE N/A 04/18/2020   Procedure: ABDOMINAL EXPOSURE;  Surgeon: [name], MD;  Location: [facility] OR;  Service: Vascular;  Laterality: N/A;   ANTERIOR LAT LUMBAR FUSION Left 04/18/2020   Procedure: ANTERIOR LATERAL LUMBAR FUSION LUMBAR TWO-THREE, LUMBAR THREE-FOUR;  Surgeon: [name], MD;  Location: [facility] OR;  Service: Neurosurgery;  Laterality: Left;   ANTERIOR LUMBAR FUSION N/A 04/18/2020   Procedure: Lumbar Four-Five Lumbar Five- Sacral One Anterior lumbar Interbody Fusion;  Surgeon: [name], MD;  Location: [facility] OR;  Service: Neurosurgery;  Laterality: N/A;   APPLICATION OF INTRAOPERATIVE CT SCAN N/A 04/21/2020   Procedure: APPLICATION OF INTRAOPERATIVE CT SCAN;  Surgeon:  [name], MD;  Location: [facility] OR;  Service: Neurosurgery;  Laterality: N/A;   BACK SURGERY     lumbar disc surgery   COLONOSCOPY W/ POLYPECTOMY     HERNIA REPAIR Left    left inguinal hernia repair   IR RADIOLOGIST EVAL & MGMT  02/24/2021   KNEE ARTHROSCOPY Right 2003    -scope for meniscus   LUMBAR WOUND DEBRIDEMENT N/A 01/13/2021   Procedure: THORACOLUMBAR WOUND DEBRIDEMENT;  Surgeon: [name], MD;  Location: [facility] OR;  Service: Neurosurgery;  Laterality: N/A;   POSTERIOR LUMBAR FUSION 4 LEVEL N/A 04/21/2020   Procedure: Thoracic ten to pelvis pedicle screw fixation with osteotomies;  Surgeon: [name], MD;  Location: [facility] OR;  Service: Neurosurgery;  Laterality: N/A;   TOTAL HIP ARTHROPLASTY Left 06/15/2016   Procedure: LEFT TOTAL HIP ARTHROPLASTY ANTERIOR APPROACH;  Surgeon: [name], MD;  Location: [facility];  Service: Orthopedics;  Laterality: Left;   Past Surgical History:  Procedure Laterality Date   ABDOMINAL EXPOSURE N/A 04/18/2020   Procedure: ABDOMINAL EXPOSURE;  Surgeon: [name], MD;  Location: [facility] OR;  Service: Vascular;  Laterality: N/A;   ANTERIOR LAT LUMBAR FUSION Left 04/18/2020   Procedure: ANTERIOR LATERAL LUMBAR FUSION LUMBAR TWO-THREE, LUMBAR THREE-FOUR;  Surgeon: [name], MD;  Location: [facility] OR;  Service: Neurosurgery;  Laterality: Left;   ANTERIOR LUMBAR FUSION N/A 04/18/2020   Procedure: Lumbar Four-Five Lumbar Five- Sacral One Anterior lumbar Interbody Fusion;  Surgeon: [name], MD;  Location: [facility] OR;  Service: Neurosurgery;  Laterality: N/A;   APPLICATION OF INTRAOPERATIVE CT SCAN N/A 04/21/2020   Procedure: APPLICATION OF INTRAOPERATIVE CT SCAN;  Surgeon: [name], MD;  Location: [facility] OR;  Service: Neurosurgery;  Laterality: N/A;   BACK SURGERY     lumbar disc surgery   COLONOSCOPY W/ POLYPECTOMY     HERNIA REPAIR Left    left inguinal hernia repair   IR RADIOLOGIST EVAL & MGMT  02/24/2021   KNEE ARTHROSCOPY Right 2003    -scope for meniscus   LUMBAR WOUND DEBRIDEMENT N/A 01/13/2021   Procedure: THORACOLUMBAR WOUND DEBRIDEMENT;  Surgeon: [name], MD;  Location: [facility] OR;  Service: Neurosurgery;  Laterality: N/A;   POSTERIOR LUMBAR FUSION 4 LEVEL N/A 04/21/2020   Procedure: Thoracic ten to pelvis pedicle screw fixation with osteotomies;  Surgeon: [name], MD;  Location: [facility] OR;  Service: Neurosurgery;  Laterality: N/A;   TOTAL HIP ARTHROPLASTY Left 06/15/2016   Procedure: LEFT TOTAL HIP ARTHROPLASTY ANTERIOR APPROACH;  Surgeon: [name], MD;  Location: [facility];  Service: Orthopedics;  Laterality: Left;   Past Medical  History:  Diagnosis Date   Arthritis    arthritis-bilateral knees, left Hip.   Degenerative lumbar spinal stenosis    EKG abnormality    06-08-16- being evaluated by cardiologist  in [place]   Idiopathic scoliosis of lumbar region    Joint stiffness    Knee pain    Leg swelling    Low back pain    Achy, shooting pain.  Hurts to Walk or stand up straight.   Lumbar radiculopathy    Scoliosis concern    Sleep apnea    Bipap use nightly 25/19 -3l/m oxygen   Spondylolisthesis of lumbar region    Transfusion history    [place] ,[place] after bleeding post colon polyp removal and colonscopy procedure   BP 123/88   Pulse (!) 116   Temp 98.3 F (36.8 C)   Ht 5\' 7"  (1.702 m)   Wt  234 lb (106.1 kg)   SpO2 94%   BMI 36.65 kg/m   Opioid Risk Score:   Fall Risk Score:  `1  Depression screen PHQ 2/9  Depression screen PHQ 2/9 02/26/2021  Decreased Interest 0  Down, Depressed, Hopeless 0  PHQ - 2 Score 0  Altered sleeping 1  Tired, decreased energy 1  Change in appetite 1  Feeling bad or failure about yourself  0  Trouble concentrating 0  Moving slowly or fidgety/restless 0  Suicidal thoughts 0  PHQ-9 Score 3    Review of Systems  Constitutional:  Positive for appetite change.  Gastrointestinal:  Positive for abdominal pain and constipation.  Musculoskeletal:  Positive for back pain and myalgias.       Pain in both knees  Neurological:  Positive for weakness and numbness.  All other systems reviewed and are negative.     Objective:   Physical Exam Constitutional: No distress . Vital signs reviewed. HENT: Normocephalic.  Atraumatic. Eyes: EOMI. No discharge. Cardiovascular: No JVD.   Respiratory: Normal effort.  No stridor.   GI: Non-distended.   Skin: Warm and dry.  Intact. Psych: Normal mood.  Normal behavior. Musc: LE edema. No tenderness in extremities. Prominence in left thoracic spine Neuro: Alert and oriented.  Motor: B/l LE: 4--4/5 HF, KE, 2+/5  ADF  Assessment & Plan:  Right-handed male with history of obesity with BMI of 41.88 back surgery with pelvis fixation 03/2020, OSA on BiPAP and recent admission 01/10/2021 - 01/16/2021 for sepsis secondary to moderate size abscess near the pelvic hardware presents for hospital follow up after receiving CIR for debility  Debility secondary to sepsis due to E. Coli/ Left psoas abscess.  Cont therapies  Follow up with Neurosurg- needs appointment  Follow up with ID  EMG with Neurosurg  2. Back pain  Patient would like to obtain xray at Neurosurg for posterior left thoracic prominence   Encouraged OTC Lidocaine patch  3. ID Cont abx Follow up with ID  4.  Right  knee pseudogout.   Controlled at presented Has not filled colchicine   5. Constipation  Improving with meds  Meds reviewed Referrals reviewed All questions answered

## 2021-03-04 ENCOUNTER — Ambulatory Visit (INDEPENDENT_AMBULATORY_CARE_PROVIDER_SITE_OTHER): Payer: BC Managed Care – PPO | Admitting: Internal Medicine

## 2021-03-04 ENCOUNTER — Other Ambulatory Visit: Payer: Self-pay

## 2021-03-04 VITALS — BP 139/85 | HR 110 | Temp 98.2°F | Resp 16 | Ht 67.0 in | Wt 234.0 lb

## 2021-03-04 DIAGNOSIS — J869 Pyothorax without fistula: Secondary | ICD-10-CM

## 2021-03-04 DIAGNOSIS — K651 Peritoneal abscess: Secondary | ICD-10-CM

## 2021-03-04 DIAGNOSIS — G061 Intraspinal abscess and granuloma: Secondary | ICD-10-CM

## 2021-03-04 DIAGNOSIS — M4620 Osteomyelitis of vertebra, site unspecified: Secondary | ICD-10-CM

## 2021-03-04 DIAGNOSIS — G062 Extradural and subdural abscess, unspecified: Secondary | ICD-10-CM

## 2021-03-04 DIAGNOSIS — T847XXD Infection and inflammatory reaction due to other internal orthopedic prosthetic devices, implants and grafts, subsequent encounter: Secondary | ICD-10-CM | POA: Diagnosis not present

## 2021-03-04 NOTE — Patient Instructions (Signed)
Mri back will be ordered  Based on mri finding will decide what to do with your iv antibiotics  Please follow up with your Neurosurgeon for your back finding  See your primary care for abdominal bloating, which is unlikely related to infection or medication  See id clinic again in 3-4 weeks

## 2021-03-04 NOTE — Progress Notes (Signed)
[facility]  Patient Active Problem List   Diagnosis Date Noted   Debility    Prediabetes    Constipation by delayed colonic transit    Slow transit constipation    Hyponatremia    Acute on chronic anemia    Abdominal fluid collection    Pseudogout of knee, right 02/03/2021   Sepsis ([place]) 02/03/2021   Sepsis due to Escherichia coli (E. coli) ([place]) 02/01/2021   Empyema ([place])    Anasarca 01/20/2021   OSA (obstructive sleep apnea) 01/20/2021   Obesity, Class III, BMI 40-49.9 (morbid obesity) ([place]) 01/20/2021   Pleural effusion    Vertebral osteomyelitis ([place])    Hardware complicating wound infection ([place])    Abscess of back 01/11/2021   Lumbar scoliosis 04/18/2020   Back pain 12/25/2019   S/P left THA, AA 06/15/2016      Subjective:    [name], male    DOB: [birth year], 70 y.o.   MRN: [id number]  CC: f/u hospital admission vertebral thoracic osteomyelitis/empyema/intra-peritoneal abscess  HPI:  Marco Kennedy is a 70 y.o. male prior extensive thoracolumbar hardware, complicated by thoracic vertebral om/epidural abscess here for hospital discharge f/u  I last saw him 02/05/2021 in the hospital. Prior to that he had I&D 7/19 with cx showing ecoli, then discharged but readmitted 7/22 with dyspnea found to have empyema (chest tubes bilateral but cx negative -- lights exudative criteria met however). During the second admission also ct scan finding of left retroperitoneal fluid collection s/p IR drain placement 8/02, and right knee infusion s/p aspiration 8/03 without evidence of infection.  Other previous w/u include a negative tte. And both admissions blood culture negative.  ----------------- 03/04/21 id f/u "Things are rough" right now... feels bloating. Last bm today. Bilateral side pain just below the the diaphragm level worse with coughing or pushing up to get out of chair/wheel chair No f/c No short of  breath Doesn't cough much Can't eat much because of the bloating Appetite not as good as baseline Noticed a lump on the back along the incision -- doesn't hurt Will see NSG next week A week ago the radiologist removed the drain -- as it wasn't draining He doesn't have any labs available today; using [facility] for his antibiotics Patient's rue picc no issue. He remains on cefazolin   No Known Allergies    Outpatient Medications Prior to Visit  Medication Sig Dispense Refill   acetaminophen (TYLENOL) 325 MG tablet Take 2 tablets (650 mg total) by mouth every 6 (six) hours as needed for mild pain (or Fever >/= 101).     amLODipine (NORVASC) 5 MG tablet Take 1 tablet (5 mg total) by mouth daily. 30 tablet 0   ceFAZolin (ANCEF) 2-4 GM/100ML-% IVPB Inject 100 mLs (2 g total) into the vein every 8 (eight) hours. 1 each    ceFAZolin (ANCEF) IVPB Inject 2 g into the vein every 8 (eight) hours. Indication:  abscess and empyema First Dose: Yes Last Day of Therapy:  03/11/21 Labs - Once weekly:  CBC/D and BMP, Labs - Every other week:  ESR and CRP Method of administration: IV Push Method of administration may be changed at the discretion of home infusion pharmacist based upon assessment of the patient and/or caregiver's ability to self-administer the medication ordered. 102 Units 0   cholecalciferol (VITAMIN D3) 25 MCG (1000 UNIT) tablet Take 1 tablet (1,000 Units total) by mouth daily.  30 tablet 0   colchicine 0.6 MG tablet Take 1 tablet (0.6 mg total) by mouth daily. (Patient not taking: Reported on 02/26/2021) 30 tablet 0   diclofenac (VOLTAREN) 75 MG EC tablet Take 75 mg by mouth 2 (two) times daily.     Multiple Vitamin (MULTIVITAMIN WITH MINERALS) TABS tablet Take 1 tablet by mouth daily.     polyethylene glycol (MIRALAX / GLYCOLAX) 17 g packet Take 17 g by mouth 2 (two) times daily. 14 each 0   No facility-administered medications prior to visit.     Social History   Socioeconomic  History   Marital status: Married    Spouse name: Not on file   Number of children: 2   Years of education: Not on file   Highest education level: Not on file  Occupational History   Not on file  Tobacco Use   Smoking status: Never   Smokeless tobacco: Never  Vaping Use   Vaping Use: Never used  Substance and Sexual Activity   Alcohol use: Not Currently    Comment: rare- social    Drug use: No   Sexual activity: Yes  Other Topics Concern   Not on file  Social History Narrative   Not on file   Social Determinants of Health   Financial Resource Strain: Not on file  Food Insecurity: Not on file  Transportation Needs: Not on file  Physical Activity: Not on file  Stress: Not on file  Social Connections: Not on file  Intimate Partner Violence: Not on file      Review of Systems     Objective:    There were no vitals taken for this visit. Nursing note and vital signs reviewed.  Physical Exam   General/constitutional: no distress, pleasant HEENT: Normocephalic, PER, Conj Clear, EOMI, Oropharynx clear Neck supple CV: rrr no mrg Lungs: clear to auscultation, normal respiratory effort Abd: Soft, Nontender Ext: no edema Skin: No Rash Neuro: nonfocal MSK: incision intact without surrounding tenderness/swelling. There is a hard lump at around t10 area paraspinous but feels bony vs that being hardware rather than fluctuance. Tender also along rib cage lateral anteriorly on palpation   Central line presence: RUE picc no erythema/tenderness    Labs: Lab Results  Component Value Date   WBC 8.7 02/13/2021   HGB 8.6 (L) 02/13/2021   HCT 27.9 (L) 02/13/2021   MCV 89.1 02/13/2021   PLT 377 76/80/8811   Last metabolic panel Lab Results  Component Value Date   GLUCOSE 110 (H) 02/13/2021   NA 133 (L) 02/13/2021   K 4.1 02/13/2021   CL 94 (L) 02/13/2021   CO2 31 02/13/2021   BUN 10 02/13/2021   CREATININE 0.64 02/13/2021   GFRNONAA >60 02/13/2021   GFRAA >60  06/16/2016   CALCIUM 8.4 (L) 02/13/2021   PHOS 2.7 01/11/2021   PROT 5.6 (L) 02/13/2021   ALBUMIN 2.0 (L) 02/13/2021   BILITOT 0.2 (L) 02/13/2021   ALKPHOS 83 02/13/2021   AST 25 02/13/2021   ALT 12 02/13/2021   ANIONGAP 8 02/13/2021    Crp: 8/19   10.5 (<1) 7/26   36    (<1)  Micro:  Serology:  Imaging: 8/20 abdpelv ct 1. Left pelvic drain in the left iliac fossa has effectively drained nearly all of the previously noted left-sided pelvic fluid collection, as discussed above. 2. Persistent bilateral pleural effusions. On the left, this is simple in appearance on today's examination. On the right, there is persistent loculation  and persistent gas and pleural thickening/enhancement, indicative of residual empyema. 3. Aortic atherosclerosis. 4. Additional incidental findings, as above.  8/03 mri lumbar thoracic spine 1. 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 Circumferential dural thickening at the T10 level again effaces the thecal sac with mass effect on the spinal cord. 2. New abnormal disc signal at T9-10 and unchanged abnormal signal T10-11, consistent with discitis-osteomyelitis. 3. No lumbar spinal canal stenosis. 4. Loculated bilateral pleural collections.  Assessment & Plan:   Problem List Items Addressed This Visit       Musculoskeletal and Integument   Vertebral osteomyelitis ([place]) - Primary     Other   Hardware complicating wound infection ([place])      No orders of the defined types were placed in this encounter.    CC: Hardware associated thoracic vertebral om/epidural absess     Lines:  8/02-8/30 LLQ abd perc-drain via IR 7/21-c Right ue picc      Abx: 7/30-c cefazolin   7/26-30 cefepime 7/26-7/28 vanc Home iv cefazolin                                                          Assessment/Plan 70 yo male previous thoracolumbar hardware/back surgery 29/5284  complicated by thoracic vertebral om/epidural abscess s/p recent I&D 7/19 with cx ecoli (bcx negative at that time), readmitted a few days after discharged on 7/22 for fluid overload and bilateral pleural effusion and fever, s/p bilateral chest tube placement with effusion appearing exudative. Course also with ct imaging showing left retroperitoneal fluid collection s/p IR drain placement 8/02 with negative cx, and right knee effusion s/p aspiration 8/3 without evidence of infection either. Patient currently in rehab     #vertebral OM/epidural abscess -- hardware related #retroperitoneal fluid collection #exudative bilateral pleural effusion/empyema Initial epidural abscess I&D 7/19; cx ecoli Pleural fluid cx negative 7/16 and 7/26 Repeat bcx this admission negative  Suspect continguous spinal spread to the pleural space S/p bilateral chest tubes which were removed by 7/30 7/27 tte no evidence endocarditis 8/01 panbody ct showed abd fluid collection s/p IR drain placement 8/02 -- cx negative 8/03 repeat mri thoracic/lumbar spine show stable epidural abscess   Clinically patient doing well minimal back pain, no respiratory sx, afebrile, improved leukocytosis on cefazolin   ------------- 9/07 id clinic f/u No labs to review today... awaiting fax from [facility] At some point will need to repeat abd/pelv/chest ct and spine The abd fluid collection drain was removed 8/30 but no repeat imaging prior to that Tolerating cefazolin otherwise Reports bloating but doesn't seem to be related to infection/medication Cefazolin is slated to end on 9/14   -will decide whether or not to continue iv abx based on back mri -need weekly cbc, cmp, crp -8/20 abd/pelv ct scan looks reassuring, will defer further imaging at this time -repeat mri thoracic spine   #right knee effusion 8/03 aspirate cx negative; no sign of septic arthritis   -today's visit 9/7 assymptomatic   Follow-up: Return in about 3 weeks  (around 03/25/2021).    I have spent a total of 20 minutes of face-to-face and non-face-to-face time, excluding clinical staff time, preparing to see patient, ordering tests and/or medications, and provide counseling the patient   Jabier Mutton,  [facility] [place] [phone]  pager   [phone] cell 03/04/2021, 2:54 PM

## 2021-03-06 ENCOUNTER — Inpatient Hospital Stay: Payer: BC Managed Care – PPO | Admitting: Registered Nurse

## 2021-03-11 ENCOUNTER — Telehealth: Payer: Self-pay

## 2021-03-11 ENCOUNTER — Telehealth: Payer: Self-pay | Admitting: Pharmacist

## 2021-03-11 NOTE — Telephone Encounter (Signed)
Called and spoke to Maralyn Sago at [facility] in [place] [phone]) and gave verbal order per Dr. Renold Don to extend patient's IV antibiotics until 9/28. Sarah verbalized understanding. Dose is 2gm IV q8h.

## 2021-03-11 NOTE — Telephone Encounter (Signed)
error 

## 2021-03-11 NOTE — Telephone Encounter (Signed)
Team could we have HH extend his cefazolin for the next 2 weeks. Hopefully can transition to oral abx soon Thanks

## 2021-03-11 NOTE — Telephone Encounter (Signed)
Patient's spouse came to clinic this morning stating Choua has no more cefazolin for his infection and wants to know if he needs more. Patient's stop date was supposed to be today. Per your last note, you would reevaluate stop date based on MRI. MRI is scheduled for this Saturday. Will his end date need to be extended until MRI results are back? Please advise.   Thanks!  Margarite Gouge, PharmD, CPP Clinical Pharmacist Practitioner Infectious Diseases Clinical Pharmacist [facility]

## 2021-03-14 ENCOUNTER — Encounter (HOSPITAL_COMMUNITY)
Admission: EM | Disposition: A | Discharge status: Rehabilitation Facility | Payer: Self-pay | Source: Home / Self Care | Attending: Internal Medicine

## 2021-03-14 ENCOUNTER — Observation Stay (HOSPITAL_COMMUNITY): Payer: BC Managed Care – PPO | Admitting: Anesthesiology

## 2021-03-14 ENCOUNTER — Encounter (HOSPITAL_COMMUNITY): Payer: Self-pay | Admitting: Emergency Medicine

## 2021-03-14 ENCOUNTER — Inpatient Hospital Stay (HOSPITAL_COMMUNITY)
Admission: EM | Admit: 2021-03-14 | Discharge: 2021-04-03 | DRG: 028 | Disposition: A | Discharge status: Rehabilitation Facility | Payer: BC Managed Care – PPO | Source: Ambulatory Visit | Attending: Internal Medicine | Admitting: Internal Medicine

## 2021-03-14 ENCOUNTER — Ambulatory Visit (HOSPITAL_COMMUNITY)
Admission: RE | Admit: 2021-03-14 | Discharge: 2021-03-14 | Disposition: A | Discharge status: Home / Self Care | Payer: BC Managed Care – PPO | Source: Ambulatory Visit | Attending: Internal Medicine | Admitting: Internal Medicine

## 2021-03-14 ENCOUNTER — Emergency Department (HOSPITAL_COMMUNITY): Payer: BC Managed Care – PPO

## 2021-03-14 ENCOUNTER — Other Ambulatory Visit: Payer: Self-pay

## 2021-03-14 ENCOUNTER — Observation Stay (HOSPITAL_COMMUNITY): Payer: BC Managed Care – PPO

## 2021-03-14 DIAGNOSIS — G062 Extradural and subdural abscess, unspecified: Secondary | ICD-10-CM

## 2021-03-14 DIAGNOSIS — Z96642 Presence of left artificial hip joint: Secondary | ICD-10-CM | POA: Diagnosis present

## 2021-03-14 DIAGNOSIS — Z8261 Family history of arthritis: Secondary | ICD-10-CM

## 2021-03-14 DIAGNOSIS — M4316 Spondylolisthesis, lumbar region: Secondary | ICD-10-CM | POA: Diagnosis present

## 2021-03-14 DIAGNOSIS — G629 Polyneuropathy, unspecified: Secondary | ICD-10-CM | POA: Diagnosis present

## 2021-03-14 DIAGNOSIS — Z419 Encounter for procedure for purposes other than remedying health state, unspecified: Secondary | ICD-10-CM

## 2021-03-14 DIAGNOSIS — M4804 Spinal stenosis, thoracic region: Secondary | ICD-10-CM | POA: Diagnosis present

## 2021-03-14 DIAGNOSIS — M4620 Osteomyelitis of vertebra, site unspecified: Secondary | ICD-10-CM | POA: Diagnosis present

## 2021-03-14 DIAGNOSIS — M4644 Discitis, unspecified, thoracic region: Secondary | ICD-10-CM

## 2021-03-14 DIAGNOSIS — Z8249 Family history of ischemic heart disease and other diseases of the circulatory system: Secondary | ICD-10-CM

## 2021-03-14 DIAGNOSIS — M4624 Osteomyelitis of vertebra, thoracic region: Secondary | ICD-10-CM | POA: Diagnosis present

## 2021-03-14 DIAGNOSIS — Z20822 Contact with and (suspected) exposure to covid-19: Secondary | ICD-10-CM | POA: Diagnosis present

## 2021-03-14 DIAGNOSIS — Z800 Family history of malignant neoplasm of digestive organs: Secondary | ICD-10-CM

## 2021-03-14 DIAGNOSIS — K6812 Psoas muscle abscess: Secondary | ICD-10-CM | POA: Diagnosis present

## 2021-03-14 DIAGNOSIS — G061 Intraspinal abscess and granuloma: Secondary | ICD-10-CM | POA: Diagnosis not present

## 2021-03-14 DIAGNOSIS — G4733 Obstructive sleep apnea (adult) (pediatric): Secondary | ICD-10-CM | POA: Diagnosis present

## 2021-03-14 DIAGNOSIS — A4151 Sepsis due to Escherichia coli [E. coli]: Secondary | ICD-10-CM | POA: Diagnosis present

## 2021-03-14 DIAGNOSIS — R188 Other ascites: Secondary | ICD-10-CM

## 2021-03-14 DIAGNOSIS — M4126 Other idiopathic scoliosis, lumbar region: Secondary | ICD-10-CM | POA: Diagnosis present

## 2021-03-14 DIAGNOSIS — Z79899 Other long term (current) drug therapy: Secondary | ICD-10-CM

## 2021-03-14 DIAGNOSIS — Z7401 Bed confinement status: Secondary | ICD-10-CM

## 2021-03-14 DIAGNOSIS — R0902 Hypoxemia: Secondary | ICD-10-CM | POA: Diagnosis present

## 2021-03-14 DIAGNOSIS — K5900 Constipation, unspecified: Secondary | ICD-10-CM | POA: Diagnosis not present

## 2021-03-14 DIAGNOSIS — Z792 Long term (current) use of antibiotics: Secondary | ICD-10-CM

## 2021-03-14 DIAGNOSIS — G8929 Other chronic pain: Secondary | ICD-10-CM | POA: Diagnosis present

## 2021-03-14 DIAGNOSIS — Z452 Encounter for adjustment and management of vascular access device: Secondary | ICD-10-CM

## 2021-03-14 DIAGNOSIS — Z981 Arthrodesis status: Secondary | ICD-10-CM

## 2021-03-14 DIAGNOSIS — I10 Essential (primary) hypertension: Secondary | ICD-10-CM | POA: Diagnosis present

## 2021-03-14 DIAGNOSIS — K6819 Other retroperitoneal abscess: Secondary | ICD-10-CM | POA: Diagnosis present

## 2021-03-14 DIAGNOSIS — J869 Pyothorax without fistula: Secondary | ICD-10-CM | POA: Diagnosis present

## 2021-03-14 HISTORY — PX: THORACIC LAMINECTOMY FOR EPIDURAL ABSCESS: SHX6115

## 2021-03-14 HISTORY — PX: LUMBAR WOUND DEBRIDEMENT: SHX1988

## 2021-03-14 LAB — CBC WITH DIFFERENTIAL/PLATELET
Abs Immature Granulocytes: 0.06 10*3/uL (ref 0.00–0.07)
Basophils Absolute: 0.1 10*3/uL (ref 0.0–0.1)
Basophils Relative: 0 %
Eosinophils Absolute: 0.1 10*3/uL (ref 0.0–0.5)
Eosinophils Relative: 1 %
HCT: 38.8 % — ABNORMAL LOW (ref 39.0–52.0)
Hemoglobin: 11.9 g/dL — ABNORMAL LOW (ref 13.0–17.0)
Immature Granulocytes: 1 %
Lymphocytes Relative: 6 %
Lymphs Abs: 0.7 10*3/uL (ref 0.7–4.0)
MCH: 27.4 pg (ref 26.0–34.0)
MCHC: 30.7 g/dL (ref 30.0–36.0)
MCV: 89.4 fL (ref 80.0–100.0)
Monocytes Absolute: 0.7 10*3/uL (ref 0.1–1.0)
Monocytes Relative: 5 %
Neutro Abs: 11.2 10*3/uL — ABNORMAL HIGH (ref 1.7–7.7)
Neutrophils Relative %: 87 %
Platelets: 415 10*3/uL — ABNORMAL HIGH (ref 150–400)
RBC: 4.34 MIL/uL (ref 4.22–5.81)
RDW: 14.2 % (ref 11.5–15.5)
WBC: 12.8 10*3/uL — ABNORMAL HIGH (ref 4.0–10.5)
nRBC: 0 % (ref 0.0–0.2)

## 2021-03-14 LAB — URINALYSIS, ROUTINE W REFLEX MICROSCOPIC
Bilirubin Urine: NEGATIVE
Glucose, UA: NEGATIVE mg/dL
Hgb urine dipstick: NEGATIVE
Ketones, ur: NEGATIVE mg/dL
Leukocytes,Ua: NEGATIVE
Nitrite: NEGATIVE
Protein, ur: NEGATIVE mg/dL
Specific Gravity, Urine: 1.02 (ref 1.005–1.030)
pH: 7 (ref 5.0–8.0)

## 2021-03-14 LAB — COMPREHENSIVE METABOLIC PANEL
ALT: 7 U/L (ref 0–44)
AST: 25 U/L (ref 15–41)
Albumin: 3.3 g/dL — ABNORMAL LOW (ref 3.5–5.0)
Alkaline Phosphatase: 79 U/L (ref 38–126)
Anion gap: 11 (ref 5–15)
BUN: 30 mg/dL — ABNORMAL HIGH (ref 8–23)
CO2: 27 mmol/L (ref 22–32)
Calcium: 9.5 mg/dL (ref 8.9–10.3)
Chloride: 100 mmol/L (ref 98–111)
Creatinine, Ser: 0.76 mg/dL (ref 0.61–1.24)
GFR, Estimated: >60 mL/min (ref >60)
Glucose, Bld: 144 mg/dL — ABNORMAL HIGH (ref 70–99)
Potassium: 4.2 mmol/L (ref 3.5–5.1)
Sodium: 138 mmol/L (ref 135–145)
Total Bilirubin: 0.5 mg/dL (ref 0.3–1.2)
Total Protein: 7.6 g/dL (ref 6.5–8.1)

## 2021-03-14 LAB — RESP PANEL BY RT-PCR (FLU A&B, COVID) ARPGX2
Influenza A by PCR: NEGATIVE
Influenza B by PCR: NEGATIVE
SARS Coronavirus 2 by RT PCR: NEGATIVE

## 2021-03-14 LAB — LACTIC ACID, PLASMA: Lactic Acid, Venous: 1.5 mmol/L (ref 0.5–1.9)

## 2021-03-14 IMAGING — CR DG THORACIC SPINE 2V
3 series · 3 of 3 positions shown · non-contrast
Comparison: Thoracic spine MRI [DATE]

CLINICAL DATA: T9-10 laminectomy

EXAM:
THORACIC SPINE 2 VIEWS

[lateral (1 of 3)]
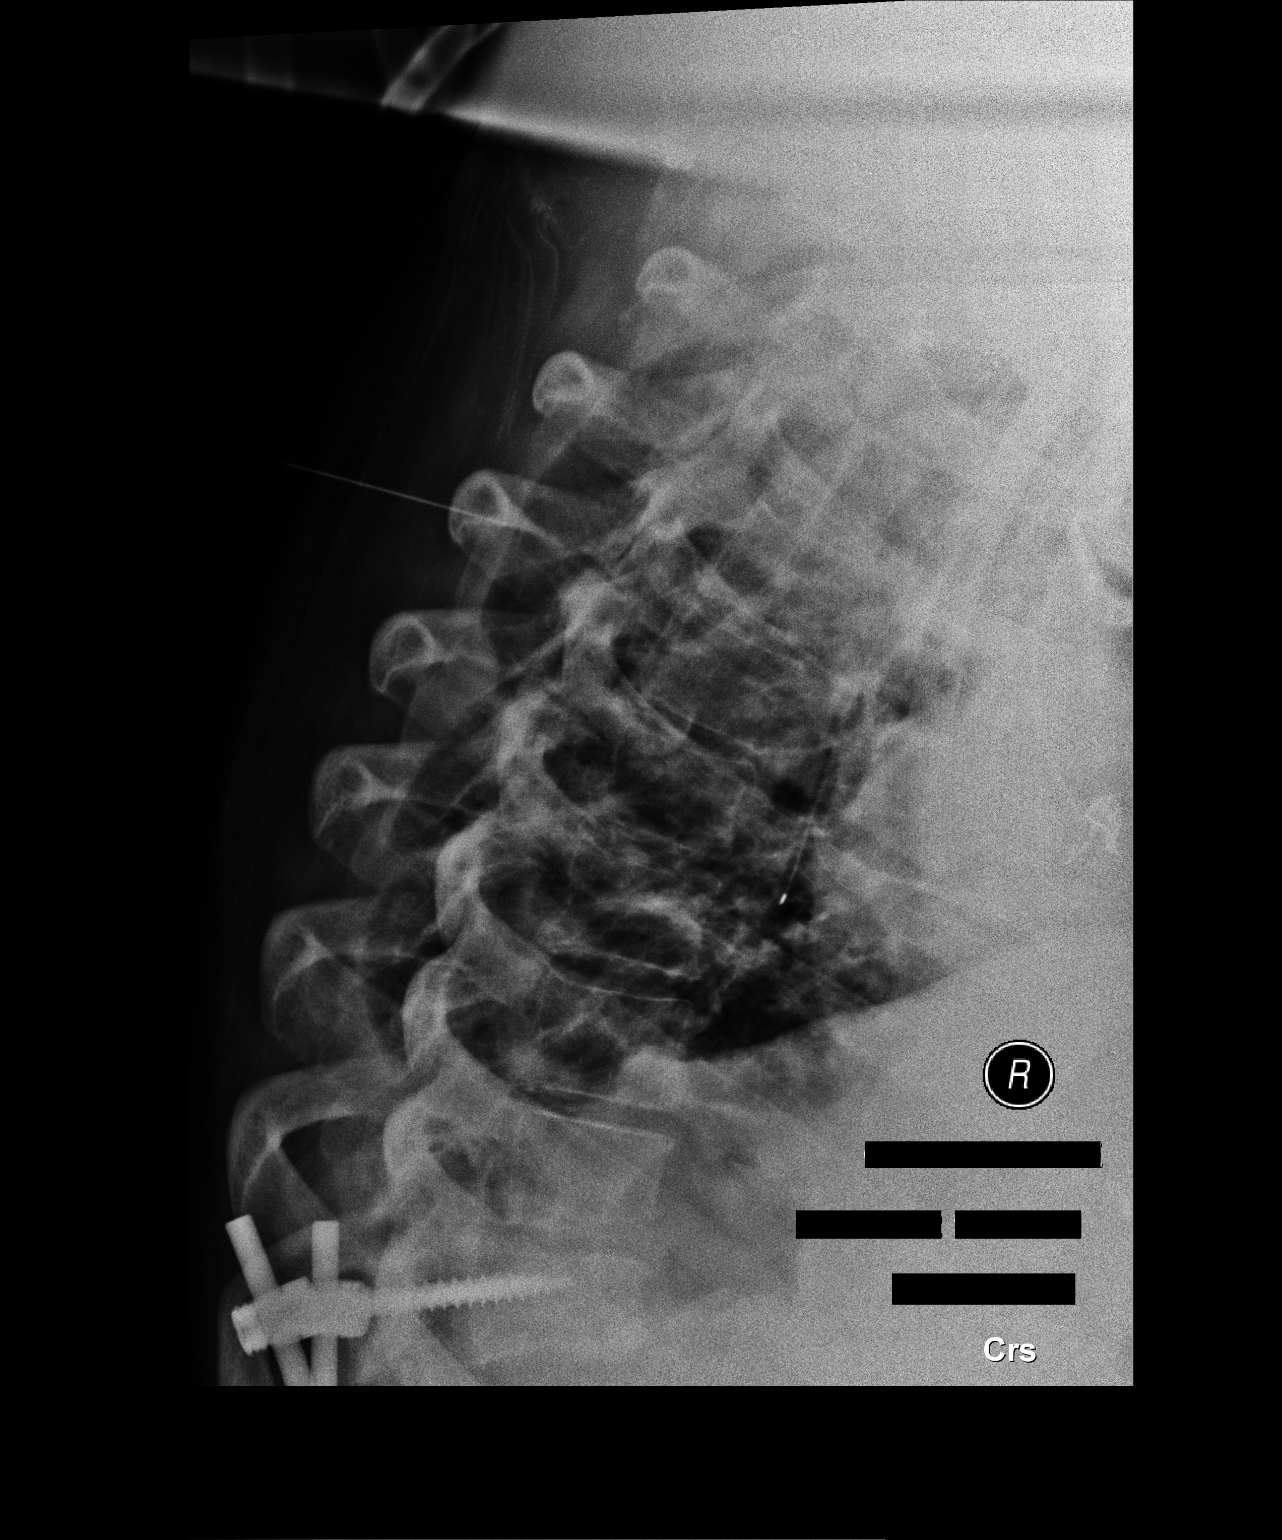

[lateral (2 of 3)]
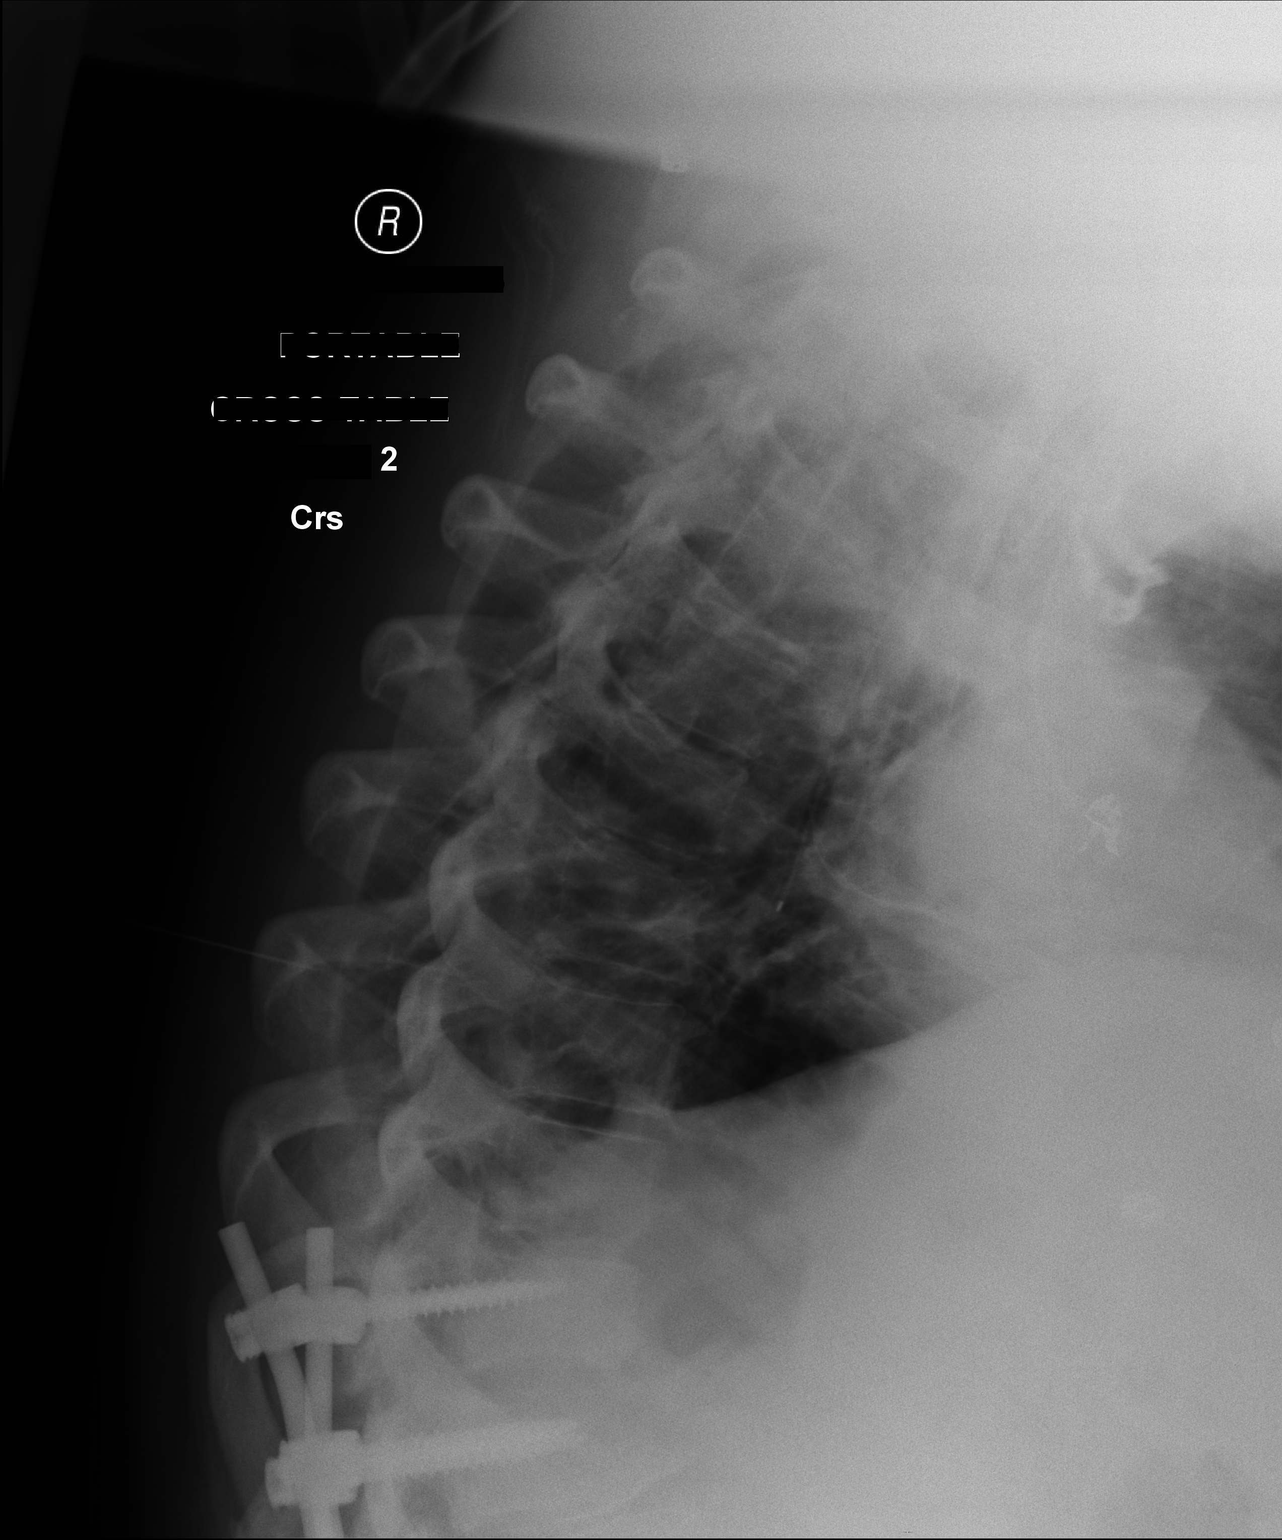

[lateral (3 of 3)]
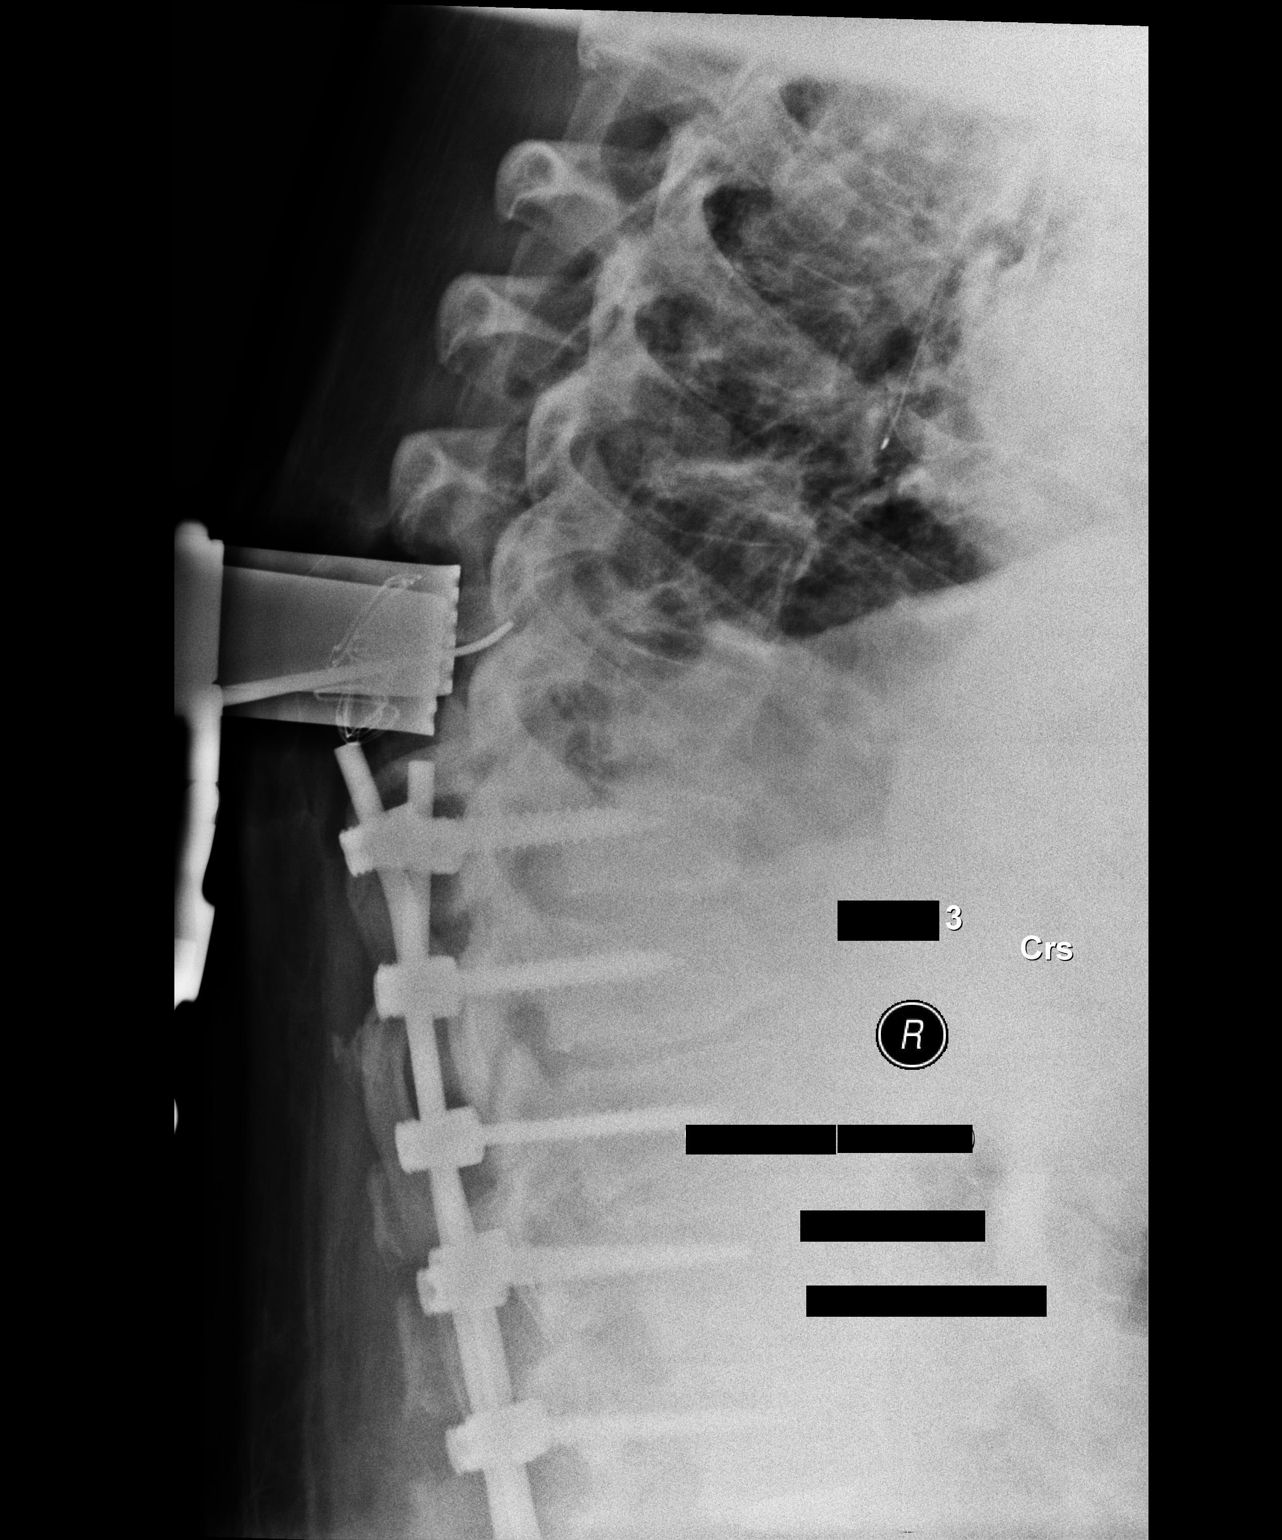

[3 of 3 positions shown; findings below may reference images not displayed]

FINDINGS: Based on numbering from the recent MRI that shows the most superior
level of thoracic spine hardware to be T10, the third provided image
shows a probe with its tip in line with the T8-9 disc space. The
more proximal portion may be abutting the underside of the T9
lamina, but visualization is obscured by the tissue spreader.
IMPRESSION: Probe tip at the T8-9 disc space.

## 2021-03-14 IMAGING — MR MR THORACIC SPINE W/O CM
5 of 6 series · 26 of 48 positions shown · non-contrast
Comparison: [DATE]

CLINICAL DATA: Follow-up epidural abscess.

EXAM:
MRI THORACIC SPINE WITHOUT CONTRAST
TECHNIQUE: Multiplanar, multisequence MR imaging of the thoracic spine was
performed. No intravenous contrast was administered.

[Series 14: T1 · sagittal · 6.0mm · 1.23mm/px · 4 of 8 slices shown (1 of 2)]
[im 1/8]
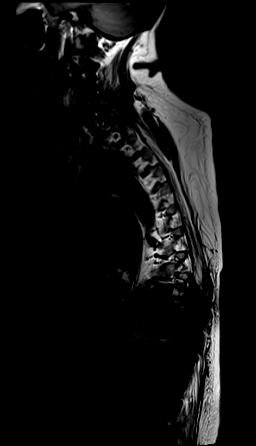
[im 3/8]
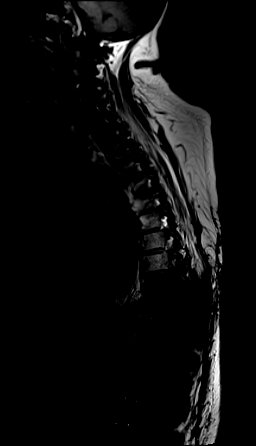
[im 5/8]
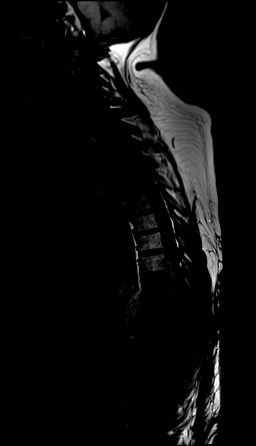
[im 8/8]
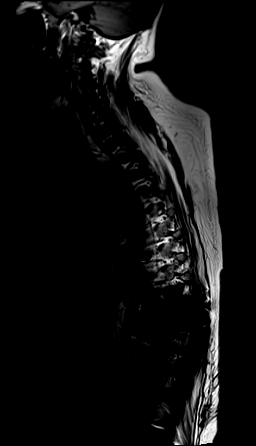

[Series 15: T2 · sagittal · 3.0mm · 0.76mm/px · 6 of 19 slices shown (1 of 2)]
[im 1/19]
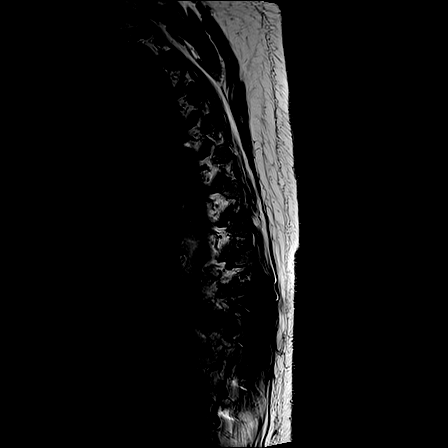
[im 4/19]
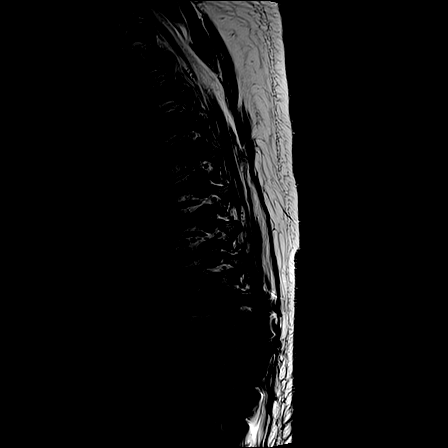
[im 8/19]
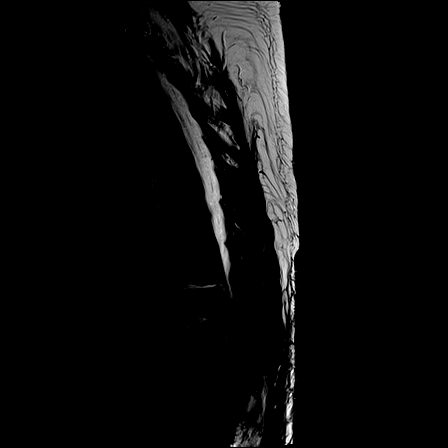
[im 11/19]
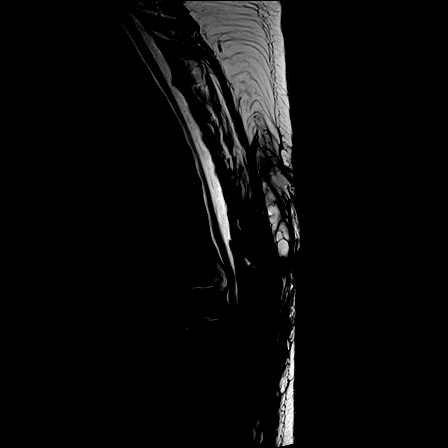
[im 15/19]
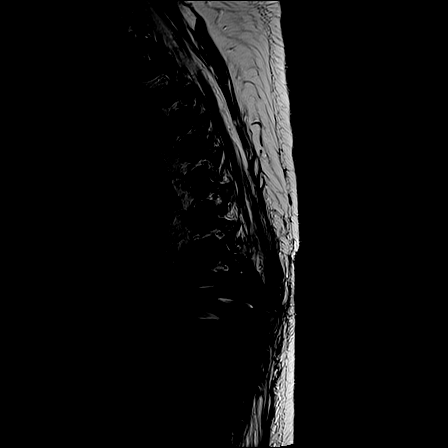
[im 19/19]
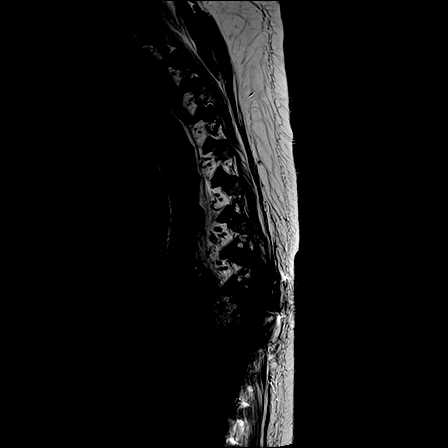

[Series 16: T1 · sagittal · 3.0mm · 0.76mm/px · 6 of 19 slices shown (2 of 2)]
[im 1/19]
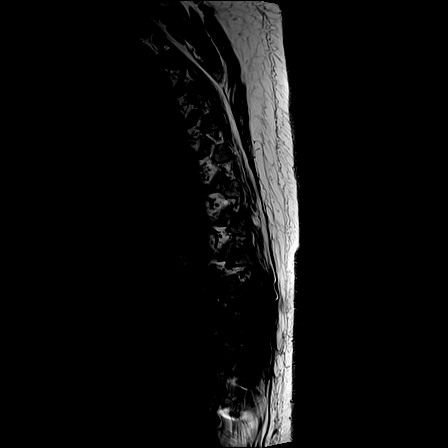
[im 4/19]
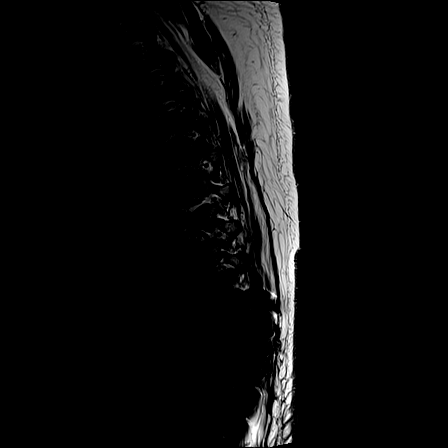
[im 8/19]
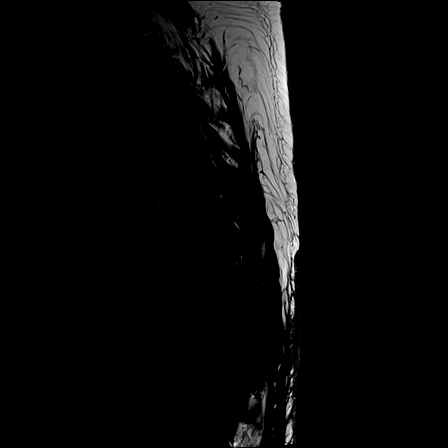
[im 11/19]
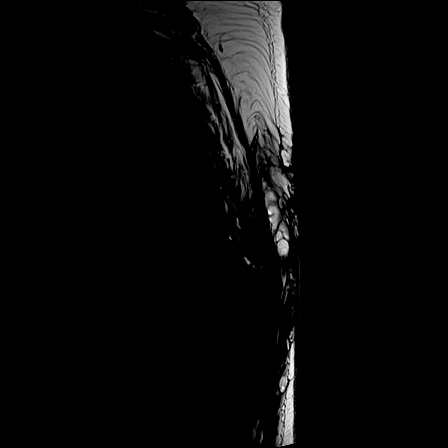
[im 15/19]
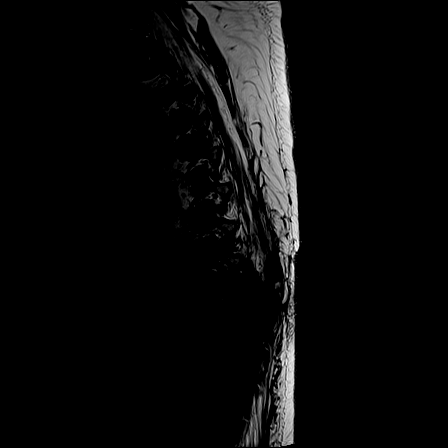
[im 19/19]
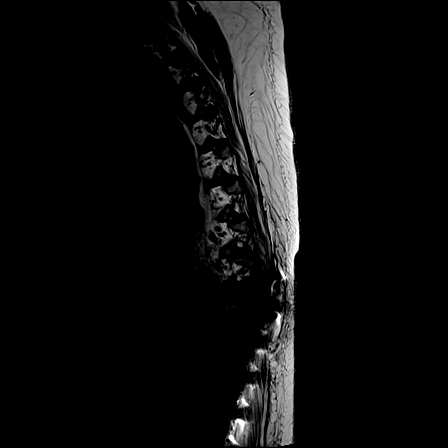

[Series 17: STIR · sagittal · 3.0mm · 0.38mm/px · 1 of 19 slices shown]
[im 1/19]
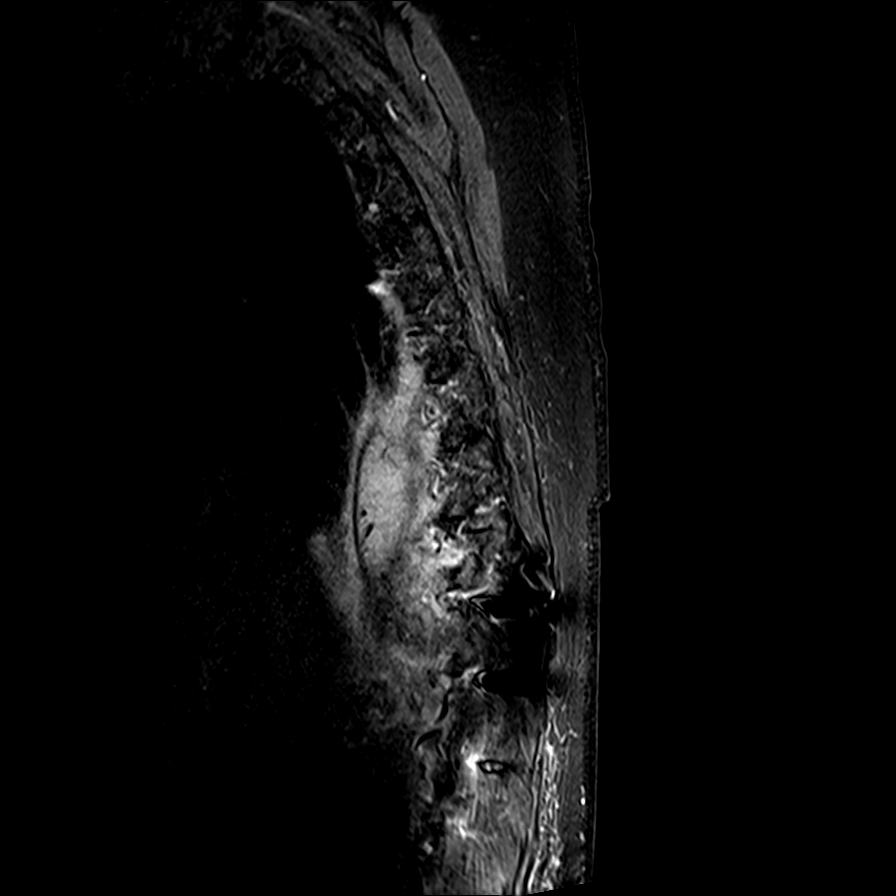

[Series 18: T2 · axial · 4.0mm · 0.59mm/px · z∈[-219,-14]mm · 9 of 39 slices shown (2 of 2)]
[im 1/39]
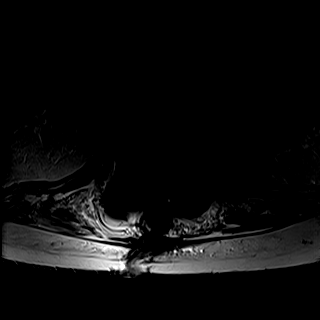
[im 7/39]
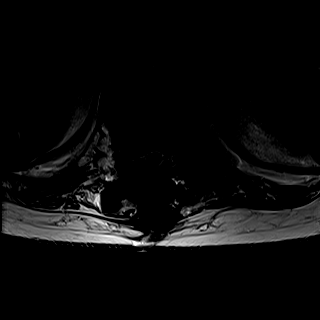
[im 13/39]
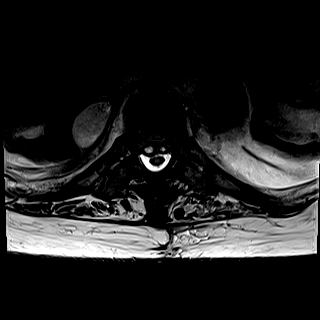
[im 16/39]
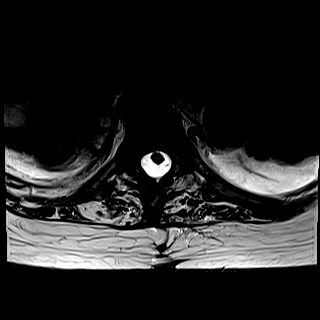
[im 20/39]
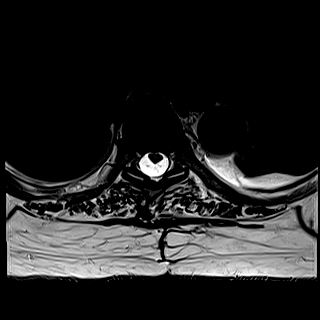
[im 23/39]
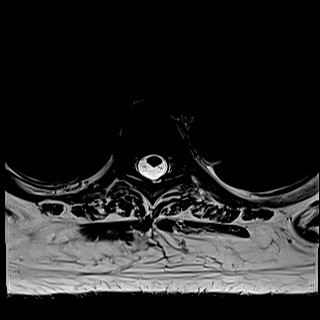
[im 26/39]
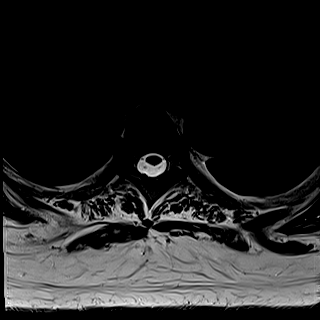
[im 32/39]
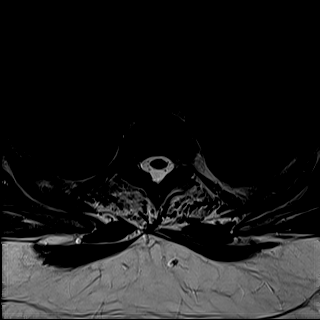
[im 39/39]
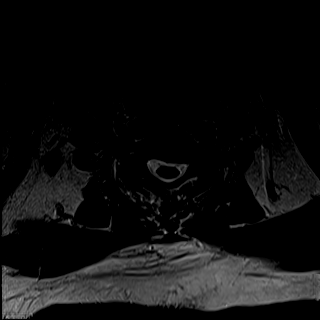

[26 of 48 positions shown; findings below may reference images not displayed]

FINDINGS: Alignment: Mildly increased kyphosis in the lower thoracic spine.
Minimal retrolisthesis of T11 on T12 and T12 on L1.

Vertebrae: Posterior spinal fusion instrumentation is again
partially visualized beginning at T10 and extending inferiorly into
the lumbar spine. There is progressive, extensive marrow edema
throughout the T9, T10, and T11 vertebral bodies. This is associated
with increased fluid signal in the T10-11 greater than T9-10 disc
spaces with associated endplate erosion, most notable at T11 where
there is moderate to severe anterior vertebral body height loss.
There is a persistent ventral epidural fluid collection which does
not extend as far cranially as on the prior MRI (currently T8 and
previously T7), however the overall size of the current collection
at the T9 and T10 levels is larger than on the prior study and
results in increased, moderate spinal stenosis at T9-10 and severe
spinal stenosis at T10-11 with moderate cord flattening.

Cord:  No definite cord edema.

Paraspinal and other soft tissues: Persistent extensive
paravertebral inflammation/phlegmon in the lower thoracic spine.
Decreased size of loculated bilateral pleural collections.

Disc levels:

Postoperative an infectious findings in the lower thoracic spine as
detailed above. Minor spondylosis and moderate facet arthrosis more
superiorly in the thoracic spine without associated spinal stenosis.
IMPRESSION: 1. Worsening findings of discitis-osteomyelitis at T9-10 and T10-11
with enlargement of the epidural abscess at these levels resulting
in severe spinal stenosis at T10-11. No cord edema.
2. Persistent extensive paravertebral inflammation/phlegmon.
Decreased size of loculated bilateral pleural collections.

These results will be called to the ordering clinician or
representative by the Radiologist Assistant, and communication
documented in the PACS or [REDACTED].

## 2021-03-14 IMAGING — CT CT ABD-PELV W/ CM
2 of 5 series · 15 of 46 positions shown, 17 images · IV contrast (Omni 300)
Comparison: MRI thoracic spine [DATE]. CT abdomen and pelvis
[DATE].

CLINICAL DATA: Follow-up for epidural abscess.

EXAM:
CT ABDOMEN AND PELVIS WITH CONTRAST
TECHNIQUE: Multidetector CT imaging of the abdomen and pelvis was performed
using the standard protocol following bolus administration of
intravenous contrast.
CONTRAST:  100mL OMNIPAQUE IOHEXOL 350 MG/ML SOLN

[Series 3: a/p w/ 5mm · axial · 0.97mm/px · z∈[+746,+1191]mm · 12 of 99 slices shown, 14 images]
[im 5/99  soft-tissue]
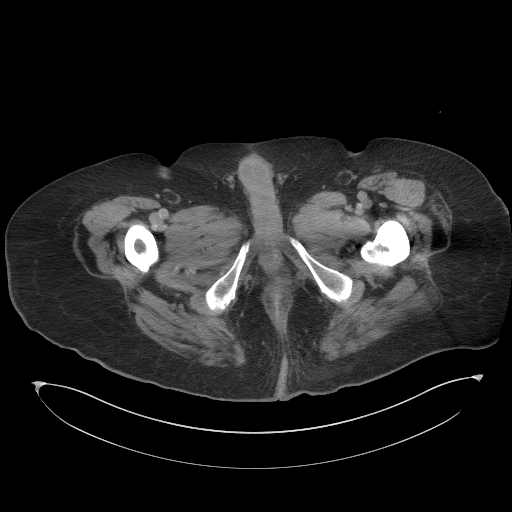
[im 5/99  bone]
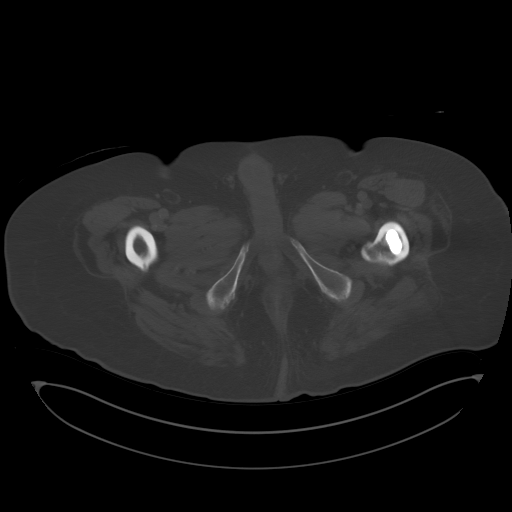
[im 15/99  soft-tissue]
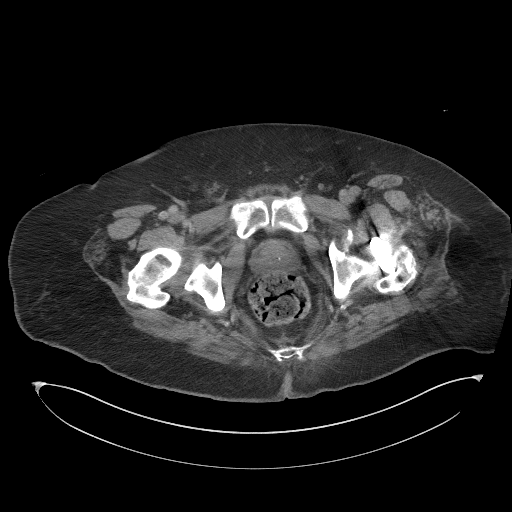
[im 20/99  soft-tissue]
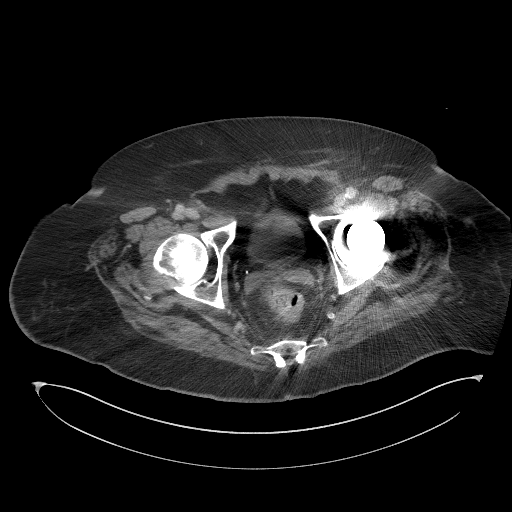
[im 30/99  soft-tissue]
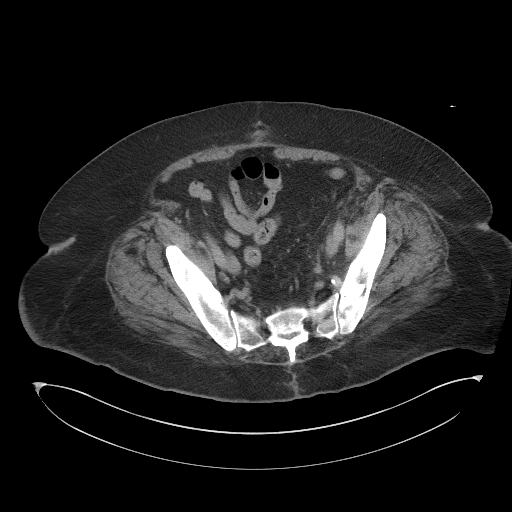
[im 40/99  soft-tissue]
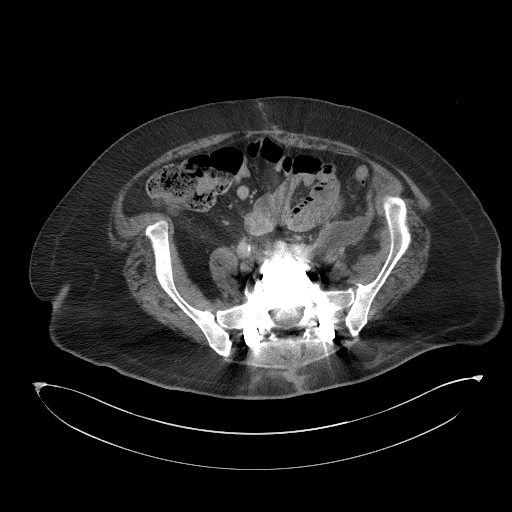
[im 45/99  soft-tissue]
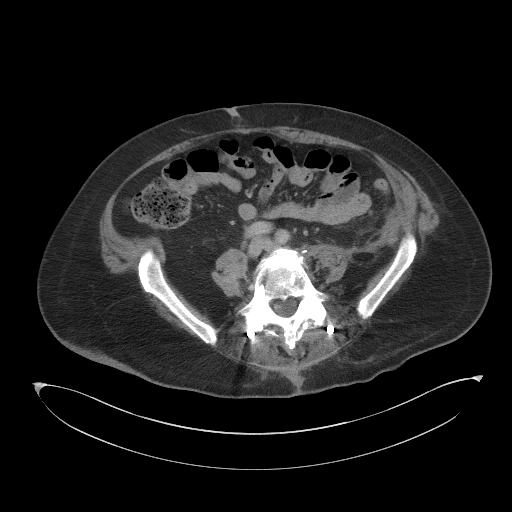
[im 54/99  soft-tissue]
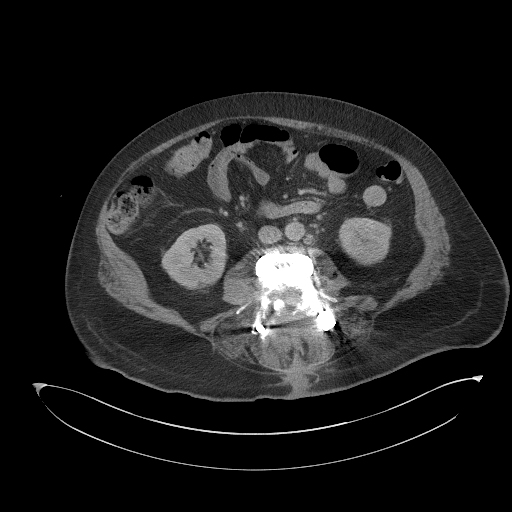
[im 59/99  soft-tissue]
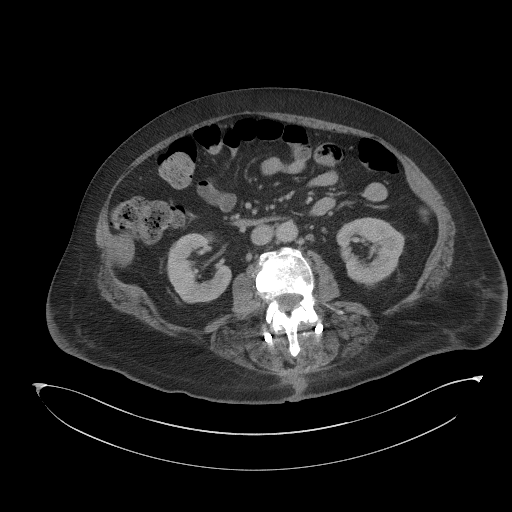
[im 69/99  soft-tissue]
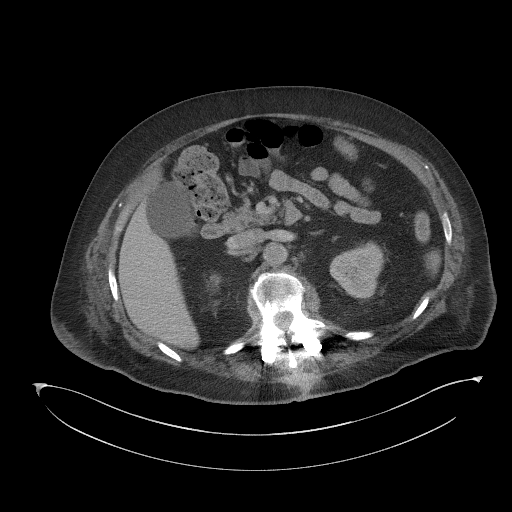
[im 69/99  bone]
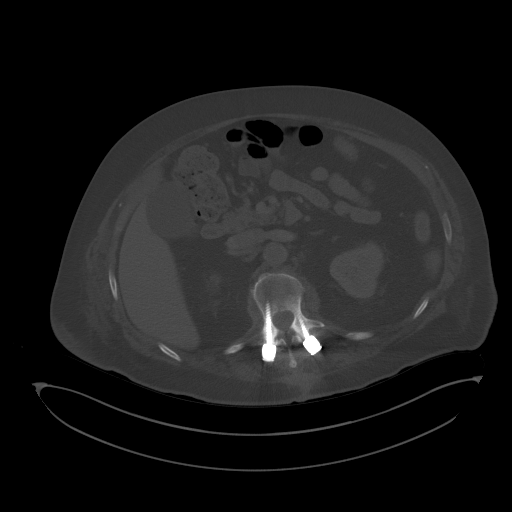
[im 79/99  soft-tissue]
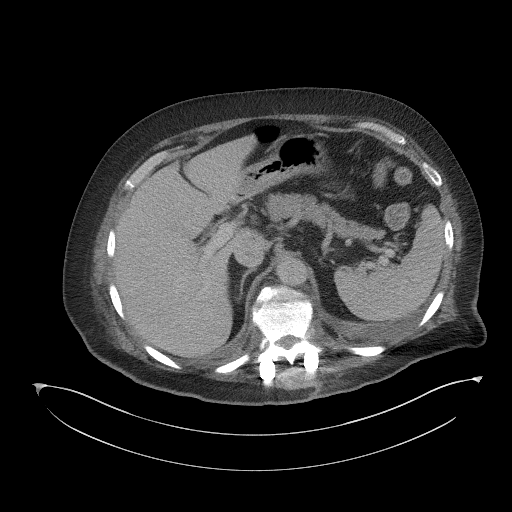
[im 84/99  soft-tissue]
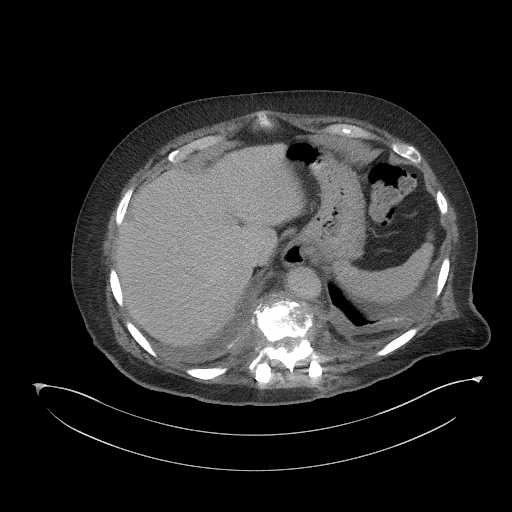
[im 94/99  soft-tissue]
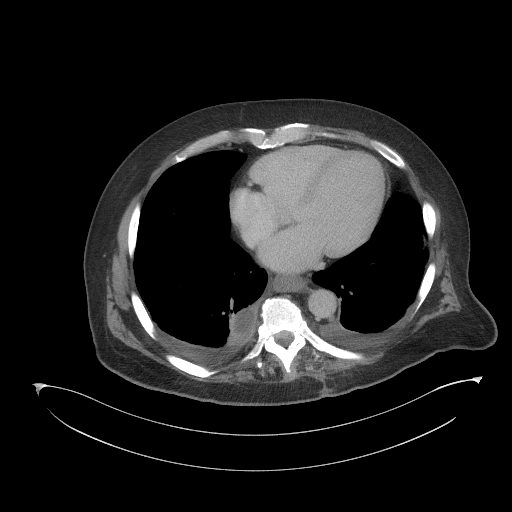

[Series 6: a/p w/ cor · coronal · 0.96mm/px · 3 of 175 slices shown]
[im 59/175  soft-tissue]
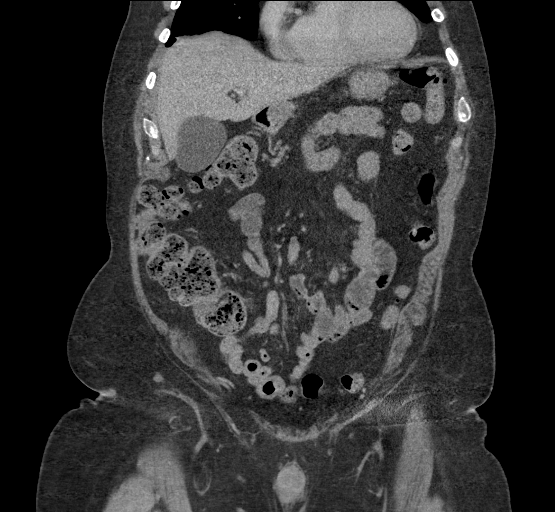
[im 78/175  soft-tissue]
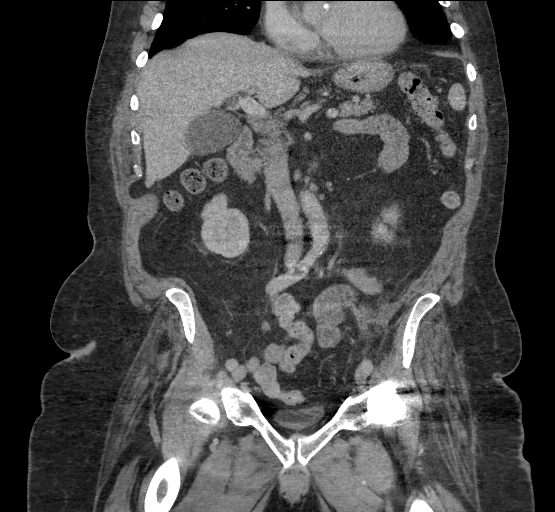
[im 97/175  soft-tissue]
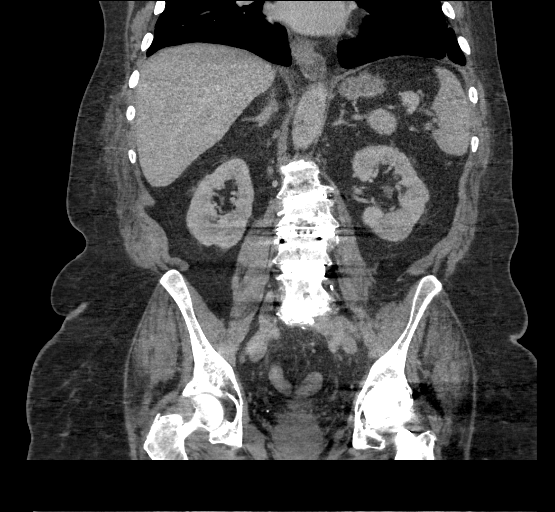

[15 of 46 positions shown; findings below may reference images not displayed]

FINDINGS: Lower chest: Again seen are small bilateral pleural effusions
similar to the prior study. Loculated pleural fluid with
imperceptible wall in the right lower hemithorax has decreased in
size now measuring 2.1 x 3.0 cm. Air is no longer seen within this
collection.

Hepatobiliary: No focal liver abnormality is seen. No gallstones,
gallbladder wall thickening, or biliary dilatation.

Pancreas: Unremarkable. No pancreatic ductal dilatation or
surrounding inflammatory changes.

Spleen: Normal in size without focal abnormality.

Adrenals/Urinary Tract: The bilateral kidneys are within normal
limits. There is no hydronephrosis. Bladder is decompressed and not
well evaluated secondary to streak artifact in the pelvis, but
grossly within normal limits. The adrenal glands are within normal
limits.

Stomach/Bowel: Stomach is within normal limits. Appendix appears
normal. No evidence of bowel wall thickening, distention, or
inflammatory changes. There is a small hiatal hernia, unchanged.

Vascular/Lymphatic: No significant vascular findings are present. No
enlarged abdominal or pelvic lymph nodes.

Reproductive: Prostate is unremarkable.

Other: There is no ascites. There is a small fat containing
umbilical hernia. Pelvic percutaneous drainage catheter has been
removed in the interval. Lower left retroperitoneal fluid collection
is again seen measuring 5.5 x 2.3 by 3.4 cm. This has slightly
increased in size in the interval. There is no air within this
collection.

Musculoskeletal: There is subcutaneous posterior lumbar scarring
without discrete fluid collection identified. T10-S1 posterior
fusion hardware is again noted. Endplate changes at T9-T10 and
T10-T11 correspond to patient's known osteomyelitis/discitis seen on
recent MRI. There is lucency surrounding the pedicle screws at T10
similar to the prior study. There is perivertebral soft tissue
thickening at these infected levels without discrete new drainable
fluid collection. Left hip arthroplasty is present.
IMPRESSION: 1. Findings compatible with patient's known osteomyelitis/discitis
at T9-T10 and T10-T11. There is stable lucency surrounding the T10
pedicle screws likely secondary to infection. There is paravertebral
soft tissue thickening without discrete fluid collection.
2. Left pelvic fluid collection has increased in size. The drain has
been removed in the interval. Correlate clinically for abscess.
3. Loculated pleural fluid collection in the right lower hemithorax
has slightly decreased in size, but has not resolved. Findings are
likely related to patient's known empyema.

## 2021-03-14 SURGERY — THORACIC LAMINECTOMY FOR EPIDURAL ABSCESS
Anesthesia: General | Site: Spine Thoracic

## 2021-03-14 MED ORDER — PHENYLEPHRINE HCL-NACL 20-0.9 MG/250ML-% IV SOLN
INTRAVENOUS | Status: DC | PRN
  Administered 2021-03-14: 40 ug/min via INTRAVENOUS

## 2021-03-14 MED ORDER — FENTANYL CITRATE (PF) 100 MCG/2ML IJ SOLN: 25.0000 ug | INTRAMUSCULAR | Status: DC | PRN

## 2021-03-14 MED ORDER — LIDOCAINE 2% (20 MG/ML) 5 ML SYRINGE
INTRAMUSCULAR | Status: DC | PRN
  Administered 2021-03-14: 60 mg via INTRAVENOUS

## 2021-03-14 MED ORDER — DEXAMETHASONE SODIUM PHOSPHATE 10 MG/ML IJ SOLN
INTRAMUSCULAR | Status: DC | PRN
  Administered 2021-03-14: 10 mg via INTRAVENOUS

## 2021-03-14 MED ORDER — PROPOFOL 10 MG/ML IV BOLUS
INTRAVENOUS | Status: DC | PRN
  Administered 2021-03-14: 160 mg via INTRAVENOUS

## 2021-03-14 MED ORDER — ONDANSETRON HCL 4 MG/2ML IJ SOLN
INTRAMUSCULAR | Status: DC | PRN
  Administered 2021-03-14: 4 mg via INTRAVENOUS

## 2021-03-14 MED ORDER — LACTATED RINGERS IV SOLN: INTRAVENOUS | Status: DC | PRN

## 2021-03-14 MED ORDER — SUGAMMADEX SODIUM 200 MG/2ML IV SOLN
INTRAVENOUS | Status: DC | PRN
  Administered 2021-03-14: 200 mg via INTRAVENOUS

## 2021-03-14 MED ORDER — FENTANYL CITRATE (PF) 250 MCG/5ML IJ SOLN
INTRAMUSCULAR | Status: DC | PRN
  Administered 2021-03-14: 100 ug via INTRAVENOUS
  Administered 2021-03-14: 50 ug via INTRAVENOUS

## 2021-03-14 MED ORDER — VANCOMYCIN HCL 1250 MG/250ML IV SOLN
1250.0000 mg | Freq: Two times a day (BID) | INTRAVENOUS | Status: DC
  Administered 2021-03-15 – 2021-03-16 (×3): 1250 mg via INTRAVENOUS
  Filled 2021-03-14 (×3): qty 250

## 2021-03-14 MED ORDER — PROPOFOL 10 MG/ML IV BOLUS
INTRAVENOUS | Status: AC
  Filled 2021-03-14: qty 20

## 2021-03-14 MED ORDER — ACETAMINOPHEN 10 MG/ML IV SOLN: 1000.0000 mg | Freq: Once | INTRAVENOUS | Status: DC | PRN

## 2021-03-14 MED ORDER — CEFAZOLIN SODIUM-DEXTROSE 2-4 GM/100ML-% IV SOLN: 2.0000 g | Freq: Once | INTRAVENOUS | Status: DC

## 2021-03-14 MED ORDER — LIDOCAINE-EPINEPHRINE 1 %-1:100000 IJ SOLN
INTRAMUSCULAR | Status: AC
  Filled 2021-03-14: qty 1

## 2021-03-14 MED ORDER — VANCOMYCIN HCL 2000 MG/400ML IV SOLN
2000.0000 mg | Freq: Once | INTRAVENOUS | Status: AC
  Administered 2021-03-14: 2000 mg via INTRAVENOUS
  Filled 2021-03-14: qty 400

## 2021-03-14 MED ORDER — PHENYLEPHRINE 40 MCG/ML (10ML) SYRINGE FOR IV PUSH (FOR BLOOD PRESSURE SUPPORT)
PREFILLED_SYRINGE | INTRAVENOUS | Status: DC | PRN
  Administered 2021-03-14 (×2): 80 ug via INTRAVENOUS
  Administered 2021-03-14: 120 ug via INTRAVENOUS

## 2021-03-14 MED ORDER — BUPIVACAINE HCL (PF) 0.5 % IJ SOLN
INTRAMUSCULAR | Status: AC
  Filled 2021-03-14: qty 30

## 2021-03-14 MED ORDER — OXYCODONE HCL 5 MG PO TABS
5.0000 mg | ORAL_TABLET | ORAL | Status: DC | PRN
  Administered 2021-03-14 – 2021-03-17 (×8): 5 mg via ORAL
  Filled 2021-03-14 (×8): qty 1

## 2021-03-14 MED ORDER — IOHEXOL 350 MG/ML SOLN
100.0000 mL | Freq: Once | INTRAVENOUS | Status: AC | PRN
  Administered 2021-03-14: 100 mL via INTRAVENOUS

## 2021-03-14 MED ORDER — THROMBIN 5000 UNITS EX SOLR
CUTANEOUS | Status: AC
  Filled 2021-03-14: qty 5000

## 2021-03-14 MED ORDER — EPHEDRINE SULFATE-NACL 50-0.9 MG/10ML-% IV SOSY
PREFILLED_SYRINGE | INTRAVENOUS | Status: DC | PRN
  Administered 2021-03-14 (×2): 5 mg via INTRAVENOUS

## 2021-03-14 MED ORDER — THROMBIN 5000 UNITS EX SOLR: OROMUCOSAL | Status: DC | PRN

## 2021-03-14 MED ORDER — FENTANYL CITRATE (PF) 250 MCG/5ML IJ SOLN
INTRAMUSCULAR | Status: AC
  Filled 2021-03-14: qty 5

## 2021-03-14 MED ORDER — ACETAMINOPHEN 325 MG PO TABS
650.0000 mg | ORAL_TABLET | Freq: Four times a day (QID) | ORAL | Status: DC | PRN
  Administered 2021-03-14 – 2021-03-17 (×7): 650 mg via ORAL
  Filled 2021-03-14 (×7): qty 2

## 2021-03-14 MED ORDER — 0.9 % SODIUM CHLORIDE (POUR BTL) OPTIME
TOPICAL | Status: DC | PRN
  Administered 2021-03-14: 1000 mL

## 2021-03-14 MED ORDER — MIDAZOLAM HCL 2 MG/2ML IJ SOLN
INTRAMUSCULAR | Status: AC
  Filled 2021-03-14: qty 2

## 2021-03-14 MED ORDER — ACETAMINOPHEN 10 MG/ML IV SOLN
INTRAVENOUS | Status: DC | PRN
  Administered 2021-03-14: 1000 mg via INTRAVENOUS

## 2021-03-14 MED ORDER — ROCURONIUM BROMIDE 10 MG/ML (PF) SYRINGE
PREFILLED_SYRINGE | INTRAVENOUS | Status: DC | PRN
  Administered 2021-03-14: 60 mg via INTRAVENOUS
  Administered 2021-03-14: 10 mg via INTRAVENOUS
  Administered 2021-03-14: 20 mg via INTRAVENOUS

## 2021-03-14 MED ORDER — SODIUM CHLORIDE 0.9 % IV SOLN
2.0000 g | Freq: Three times a day (TID) | INTRAVENOUS | Status: DC
  Administered 2021-03-14 – 2021-03-16 (×6): 2 g via INTRAVENOUS
  Filled 2021-03-14 (×6): qty 2

## 2021-03-14 SURGICAL SUPPLY — 46 items
BAG COUNTER SPONGE SURGICOUNT (BAG) ×2 IMPLANT
BAG SPNG CNTER NS LX DISP (BAG) ×1
BLADE CLIPPER SURG (BLADE) ×2 IMPLANT
BLADE SURG 11 STRL SS (BLADE) IMPLANT
BUR MATCHSTICK NEURO 3.0 LAGG (BURR) ×2 IMPLANT
CANISTER SUCT 3000ML PPV (MISCELLANEOUS) ×2 IMPLANT
CARTRIDGE OIL MAESTRO DRILL (MISCELLANEOUS) ×1 IMPLANT
DIFFUSER DRILL AIR PNEUMATIC (MISCELLANEOUS) ×2 IMPLANT
DRAPE LAPAROTOMY 100X72X124 (DRAPES) ×2 IMPLANT
DRSG OPSITE 4X5.5 SM (GAUZE/BANDAGES/DRESSINGS) ×2 IMPLANT
DRSG OPSITE POSTOP 4X6 (GAUZE/BANDAGES/DRESSINGS) ×2 IMPLANT
DURAPREP 26ML APPLICATOR (WOUND CARE) ×2 IMPLANT
ELECT REM PT RETURN 9FT ADLT (ELECTROSURGICAL) ×2
ELECTRODE REM PT RTRN 9FT ADLT (ELECTROSURGICAL) ×1 IMPLANT
GAUZE 4X4 16PLY ~~LOC~~+RFID DBL (SPONGE) ×2 IMPLANT
GAUZE SPONGE 4X4 12PLY STRL (GAUZE/BANDAGES/DRESSINGS) IMPLANT
GLOVE EXAM NITRILE XL STR (GLOVE) IMPLANT
GLOVE SURG ENC MOIS LTX SZ7.5 (GLOVE) ×4 IMPLANT
GLOVE SURG LTX SZ7 (GLOVE) ×4 IMPLANT
GLOVE SURG UNDER POLY LF SZ7.5 (GLOVE) ×8 IMPLANT
GOWN STRL REUS W/ TWL LRG LVL3 (GOWN DISPOSABLE) ×2 IMPLANT
GOWN STRL REUS W/ TWL XL LVL3 (GOWN DISPOSABLE) IMPLANT
GOWN STRL REUS W/TWL 2XL LVL3 (GOWN DISPOSABLE) ×4 IMPLANT
GOWN STRL REUS W/TWL LRG LVL3 (GOWN DISPOSABLE) ×4
GOWN STRL REUS W/TWL XL LVL3 (GOWN DISPOSABLE)
HEMOSTAT POWDER KIT SURGIFOAM (HEMOSTASIS) ×2 IMPLANT
KIT BASIN OR (CUSTOM PROCEDURE TRAY) ×2 IMPLANT
KIT TURNOVER KIT B (KITS) ×2 IMPLANT
NEEDLE HYPO 18GX1.5 BLUNT FILL (NEEDLE) IMPLANT
NEEDLE HYPO 25X1 1.5 SAFETY (NEEDLE) ×2 IMPLANT
NEEDLE SPNL 18GX3.5 QUINCKE PK (NEEDLE) ×2 IMPLANT
NS IRRIG 1000ML POUR BTL (IV SOLUTION) ×2 IMPLANT
OIL CARTRIDGE MAESTRO DRILL (MISCELLANEOUS) ×2
PACK LAMINECTOMY NEURO (CUSTOM PROCEDURE TRAY) ×2 IMPLANT
SPONGE T-LAP 4X18 ~~LOC~~+RFID (SPONGE) ×4 IMPLANT
SUT VIC AB 0 CT1 18XCR BRD8 (SUTURE) ×2 IMPLANT
SUT VIC AB 0 CT1 8-18 (SUTURE) ×4
SUT VIC AB 2-0 CT1 18 (SUTURE) IMPLANT
SUT VIC AB 3-0 FS2 27 (SUTURE) IMPLANT
SUT VIC AB 3-0 SH 8-18 (SUTURE) IMPLANT
SUT VICRYL 3-0 RB1 18 ABS (SUTURE) ×4 IMPLANT
SWAB COLLECTION DEVICE MRSA (MISCELLANEOUS) IMPLANT
SWAB CULTURE ESWAB REG 1ML (MISCELLANEOUS) IMPLANT
TOWEL GREEN STERILE (TOWEL DISPOSABLE) ×2 IMPLANT
TOWEL GREEN STERILE FF (TOWEL DISPOSABLE) ×2 IMPLANT
WATER STERILE IRR 1000ML POUR (IV SOLUTION) ×2 IMPLANT

## 2021-03-14 NOTE — H&P (Signed)
Date: 03/14/2021               Patient Name:  Marco Kennedy MRN: [id number]  DOB: [birth year] Age / Sex: 70 y.o., male   PCP: Norval Gable Carlena Bjornstad, MD         Medical Service: Internal Medicine Teaching Service         Attending Physician: Dr. Earl Lagos, MD    First Contact: Dr. Ellison Carwin  Pager: [phone]  Second Contact: Dr. Marolyn Haller  Pager: [phone]       After Hours (After 5p/  First Contact Pager: [phone]  weekends / holidays): Second Contact Pager: [phone]   Chief Complaint: Epidural abscess   History of Present Illness:   Marco Kennedy is a 70 year old gentleman with a past medical history significant for chronic lower back pain, hypertension.  Patient underwent 2 stage thoracoliumbar surgery. Stage once consisted of an ALIF at the L4-5 and L5-S1 levels with left-sided XLIF at L2-3 and L3-4 levels. The second stage consisted of T10 through pelvis fixation along with osteotomies. 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  He underwent IR aspiration and subsequent debridement with neurosurgery.  At that time as aspirate culture grew E. Coli.  He was discharged at that time with a 6-week course of cefazolin.  A few days after being discharged, patient returned with hypoxia. A CT scan of his spine showed osteomyelitis/discitis. In addition he was noted to have bilateral empyemas. He underwent chest tube placement for his empyemas, but his T10 vertebral osteo/discitis was managed non operatively.  from his osteomyelitis and developed bilateral empyema's.  He underwent chest tube placement with continued IV antibiotics.  During this hospitalization he was noted to have a large left retroperitoneal fluid collection which was drained by IR.  As per cultures from this fluid collection show no growth.  During this time patient also developed some urinary fecal incontinence.  Repeat MRI of the spine showed  new abnormal signal at T9-T10, neurosurgery however felt this was not related to infection and recommended continued antibiotic therapy.  He was discharged to inpatient rehab 8/9 on IV cefazolin.   Patient presented to the hospital today for follow-up MRI to help guide his infectious disease doctors on antibiotic therapy.  While getting an MRI patient states that he experienced some acute worsening of his lower back pain shooting down his lower extremities with a foam roller was placed under his knees.  He states that after manipulation for the MRI, he told the technologist that he needed to rest for a while given the pain.  Technologist said that they would have the study read prior to the patient leaving.  On that MRI he was noted to have worsening epidural abscess with spinal stenosis and was sent to the ED for admission and further evaluation.   Per patient and his wife who was at bedside he continues to have baseline chronic lower back pain but this has not recently worsened.  He does note some numbness in the bottom of his feet and legs since his last surgery.  He states this is particularly apparent when he is getting up.  He did experience this sensation earlier today when attempting to get of the MRI table.  He uses Tylenol and diclofenac.  He has not had any issues with fecal or urinary incontinence.  He denies any fevers, chills, nausea, vomiting, chest pain, shortness of  breath.   [place]: NKDA   PMH/medications:  HTN: amlodpine 5mg  Knee OA, diclofenac Colace IV antibiotics, ancef  PSH:  Per chart    Meds:  Current Meds  Medication Sig   acetaminophen (TYLENOL) 325 MG tablet Take 2 tablets (650 mg total) by mouth every 6 (six) hours as needed for mild pain (or Fever >/= 101).   amLODipine (NORVASC) 5 MG tablet Take 1 tablet (5 mg total) by mouth daily.   ceFAZolin (ANCEF) 2-4 GM/100ML-% IVPB Inject 100 mLs (2 g total) into the vein every 8 (eight) hours.   cholecalciferol  (VITAMIN D3) 25 MCG (1000 UNIT) tablet Take 1 tablet (1,000 Units total) by mouth daily.   diclofenac (VOLTAREN) 75 MG EC tablet Take 75 mg by mouth 2 (two) times daily.   docusate sodium (COLACE) 100 MG capsule Take 100 mg by mouth daily.   Multiple Vitamin (MULTIVITAMIN WITH MINERALS) TABS tablet Take 1 tablet by mouth daily.   oxyCODONE (OXY IR/ROXICODONE) 5 MG immediate release tablet Take 5 mg by mouth every 6 (six) hours as needed for severe pain.   polyethylene glycol (MIRALAX / GLYCOLAX) 17 g packet Take 17 g by mouth 2 (two) times daily. (Patient taking differently: Take 17 g by mouth daily as needed for mild constipation.)     Allergies: Allergies as of 03/14/2021   (No Known Allergies)   Past Medical History:  Diagnosis Date   Arthritis    arthritis-bilateral knees, left Hip.   Degenerative lumbar spinal stenosis    EKG abnormality    06-08-16- being evaluated by cardiologist  in [place]   Idiopathic scoliosis of lumbar region    Joint stiffness    Knee pain    Leg swelling    Low back pain    Achy, shooting pain.  Hurts to Walk or stand up straight.   Lumbar radiculopathy    Scoliosis concern    Sleep apnea    Bipap use nightly 25/19 -3l/m oxygen   Spondylolisthesis of lumbar region    Transfusion history    [place] ,14-12-17 after bleeding post colon polyp removal and colonscopy procedure    Family History: non contributory    Social History: Non contributory   Review of Systems: A complete ROS was negative except as per HPI. Non contributory   Physical Exam: Blood pressure (!) 172/87, pulse 63, temperature 98 F (36.7 C), resp. rate 18, SpO2 100 %.  Gen: NAD CV: RRR Pulm: Non labored breathing on RA, no wheezing or crackles  Abd: Soft, NT, ND  Ext: No edema of BLE, warm, dry Neuro: Mental Status: Patient is awake, alert, oriented x3 Speech and language: normal, fluent  Cranial Nerves: II: Pupils equal, round, and reactive to light.  III,IV, VI:  EOMI all cardinal directions, without ptosis or diploplia.  V: Facial sensation is symmetric  VII: Face appears symmetric.  VIII: Hearing is intact to voice X: Uvula and palate elevate symmetrically XI: Shoulder shrug is symmetric. XII: Tongue is midline without atrophy or fasciculations.  Motor: 5/5 BUE, hip flexion R able to lift antigravity 5+ sec, Hip flexion L able to lift antigravity 5+ sec, knee flexion/extension limited d/t pain, weak dorsiflexion bilaterally, plantar flexion 5/5 Sensory: Sensation is grossly intact bilateral UEs & LEs Deep Tendon Reflexes: 2+ biceps, brachioradialis, patellar Gait: deferred.   Assessment & Plan by Problem: Active Problems:   Epidural abscess Patient presents to the ED with worsening of previous epidural abscess on imaging with no signs or symptoms  of infection likely given his IV antibiotic therapy since 12/2020. He does have a leukocytosis to 12.8. Blood cultures are pending. He was evaluated by neurosurgery who recommended take back to the OR for exploration. His antibiotics were broadened to vancomycin and cefepime. Patient will be admitted to our service after the OR.  -F/u Bcx x2 -F/u OR cultures -Continue vancomycin and cefepime, ID c/s appreciate recommendation.  -Pain control   HTN:  Hold home amlodipine for now   Resume VTE prophylaxis per NSGY.   Dispo: Admit patient to Observation with expected length of stay less than 2 midnights.  Signed: Marolyn Haller, MD 03/14/2021, 5:27 PM  Pager: [phone] After 5pm on weekdays and 1pm on weekends: On Call pager: [phone]

## 2021-03-14 NOTE — Transfer of Care (Signed)
Immediate Anesthesia Transfer of Care Note  Patient: Marco Kennedy  Procedure(s) Performed: THORACIC LAMINECTOMY, THORACIC NINE-THORACIC TEN (Spine Thoracic) WOUND EXPLORATION AND DEBRIDEMENT OF ABSCESS (Spine Thoracic)  Patient Location: PACU  Anesthesia Type:General  Level of Consciousness: drowsy  Airway & Oxygen Therapy: Patient Spontanous Breathing and Patient connected to face mask oxygen  Post-op Assessment: Report given to RN and Post -op Vital signs reviewed and stable  Post vital signs: Reviewed and stable  Last Vitals:  Vitals Value Taken Time  BP 151/82 03/14/21 2015  Temp    Pulse 73 03/14/21 2018  Resp 19 03/14/21 2018  SpO2 100 % 03/14/21 2018  Vitals shown include unvalidated device data.  Last Pain:  Vitals:   03/14/21 2013  PainSc: 0-No pain         Complications: No notable events documented.

## 2021-03-14 NOTE — ED Notes (Signed)
Patient in MRI 

## 2021-03-14 NOTE — ED Notes (Signed)
Notified EDP to follow-up about discontinued abx. IV team consult to get blood cultures d/t multiple fail attempts from previous RN and phlebotomist.

## 2021-03-14 NOTE — ED Notes (Signed)
OR RN transporting pt to OR. Provided pt antibiotics to OR RN.

## 2021-03-14 NOTE — ED Provider Notes (Signed)
[facility] EMERGENCY DEPARTMENT Provider Note   CSN: [id number] Arrival date & time: 03/14/21  1249     History No chief complaint on file.   Marco Kennedy is a 70 y.o. male presenting for evaluation after abnormal MRI.  Patient states he was at MRI today ordered by the infectious disease doctor for antibiotic management.  Prior to the scan being done, they adjusted his positioning and he had sharp pain in his low back.  After the MRI was read, he was told to come to the ER for further evaluation.  He reports continued increased discomfort since the repositioning, including pain in his low back and his lower abdomen.  He states he feels bloated. He denies fevers, chills, nausea, vomiting, urinary symptoms, abnormal bowel movements.  He has been getting Ancef through the PICC line as prescribed.  He has increased pain in his left leg, and increased weakness.  No loss of bowel bladder control.  Additional history obtained from chart review.  Patient had back surgery on October 21 with Dr. Vertell Limber.  In July he was admitted with infection of the vertebrae which had also resulted in a psoas abscess which was drained with IR.  Since then, he has been on antibiotics with a PICC line, followed with Dr. Gale Journey from ID.  I reviewed patient's MRI from today.  It shows worsening discitis as well as a enlarging epidural abscess resulting in spinal stenosis.  HPI     Past Medical History:  Diagnosis Date   Arthritis    arthritis-bilateral knees, left Hip.   Degenerative lumbar spinal stenosis    EKG abnormality    06-08-16- being evaluated by cardiologist  in [place]   Idiopathic scoliosis of lumbar region    Joint stiffness    Knee pain    Leg swelling    Low back pain    Achy, shooting pain.  Hurts to Walk or stand up straight.   Lumbar radiculopathy    Scoliosis concern    Sleep apnea    Bipap use nightly 25/19 -3l/m oxygen   Spondylolisthesis of lumbar region     Transfusion history    [place] ,[place] after bleeding post colon polyp removal and colonscopy procedure    Patient Active Problem List   Diagnosis Date Noted   Debility    Prediabetes    Constipation by delayed colonic transit    Slow transit constipation    Hyponatremia    Acute on chronic anemia    Abdominal fluid collection    Pseudogout of knee, right 02/03/2021   Sepsis ([place]) 02/03/2021   Sepsis due to Escherichia coli (E. coli) ([place]) 02/01/2021   Empyema ([place])    Anasarca 01/20/2021   OSA (obstructive sleep apnea) 01/20/2021   Obesity, Class III, BMI 40-49.9 (morbid obesity) ([place]) 01/20/2021   Pleural effusion    Vertebral osteomyelitis ([place])    Hardware complicating wound infection ([place])    Abscess of back 01/11/2021   Lumbar scoliosis 04/18/2020   Back pain 12/25/2019   S/P left THA, AA 06/15/2016    Past Surgical History:  Procedure Laterality Date   ABDOMINAL EXPOSURE N/A 04/18/2020   Procedure: ABDOMINAL EXPOSURE;  Surgeon: Marty Heck, MD;  Location: [facility];  Service: Vascular;  Laterality: N/A;   ANTERIOR LAT LUMBAR FUSION Left 04/18/2020   Procedure: ANTERIOR LATERAL LUMBAR FUSION LUMBAR TWO-THREE, LUMBAR THREE-FOUR;  Surgeon: Erline Levine, MD;  Location: [facility];  Service: Neurosurgery;  Laterality: Left;  ANTERIOR LUMBAR FUSION N/A 04/18/2020   Procedure: Lumbar Four-Five Lumbar Five- Sacral One Anterior lumbar Interbody Fusion;  Surgeon: Erline Levine, MD;  Location: [facility];  Service: Neurosurgery;  Laterality: N/A;   APPLICATION OF INTRAOPERATIVE CT SCAN N/A 04/21/2020   Procedure: APPLICATION OF INTRAOPERATIVE CT SCAN;  Surgeon: Erline Levine, MD;  Location: [facility];  Service: Neurosurgery;  Laterality: N/A;   BACK SURGERY     lumbar disc surgery   COLONOSCOPY W/ POLYPECTOMY     HERNIA REPAIR Left    left inguinal hernia repair   IR RADIOLOGIST EVAL & MGMT  02/24/2021   KNEE ARTHROSCOPY Right 2003    -scope for meniscus   LUMBAR WOUND DEBRIDEMENT N/A  01/13/2021   Procedure: THORACOLUMBAR WOUND DEBRIDEMENT;  Surgeon: Erline Levine, MD;  Location: [facility];  Service: Neurosurgery;  Laterality: N/A;   POSTERIOR LUMBAR FUSION 4 LEVEL N/A 04/21/2020   Procedure: Thoracic ten to pelvis pedicle screw fixation with osteotomies;  Surgeon: Erline Levine, MD;  Location: [facility];  Service: Neurosurgery;  Laterality: N/A;   TOTAL HIP ARTHROPLASTY Left 06/15/2016   Procedure: LEFT TOTAL HIP ARTHROPLASTY ANTERIOR APPROACH;  Surgeon: Paralee Cancel, MD;  Location: [facility];  Service: Orthopedics;  Laterality: Left;       Family History  Problem Relation Age of Onset   Hypertension Mother    Arthritis Mother    Stomach cancer Father    Hypertension Brother     Social History   Tobacco Use   Smoking status: Never   Smokeless tobacco: Never  Vaping Use   Vaping Use: Never used  Substance Use Topics   Alcohol use: Not Currently    Comment: rare- social    Drug use: No    Home Medications Prior to Admission medications   Medication Sig Start Date End Date Taking? Authorizing Provider  acetaminophen (TYLENOL) 325 MG tablet Take 2 tablets (650 mg total) by mouth every 6 (six) hours as needed for mild pain (or Fever >/= 101). 02/03/21   Debbe Odea, MD  amLODipine (NORVASC) 5 MG tablet Take 1 tablet (5 mg total) by mouth daily. 02/16/21   Angiulli, Lavon Paganini, PA-C  ceFAZolin (ANCEF) 2-4 GM/100ML-% IVPB Inject 100 mLs (2 g total) into the vein every 8 (eight) hours. 02/16/21   Angiulli, Lavon Paganini, PA-C  ceFAZolin (ANCEF) IVPB Inject 2 g into the vein every 8 (eight) hours. Indication:  abscess and empyema First Dose: Yes Last Day of Therapy:  03/11/21 Labs - Once weekly:  CBC/D and BMP, Labs - Every other week:  ESR and CRP Method of administration: IV Push Method of administration may be changed at the discretion of home infusion pharmacist based upon assessment of the patient and/or caregiver's ability to self-administer the medication ordered. 02/12/21  03/18/21  Angiulli, Lavon Paganini, PA-C  cholecalciferol (VITAMIN D3) 25 MCG (1000 UNIT) tablet Take 1 tablet (1,000 Units total) by mouth daily. 02/16/21   Angiulli, Lavon Paganini, PA-C  colchicine 0.6 MG tablet Take 1 tablet (0.6 mg total) by mouth daily. Patient not taking: Reported on 03/04/2021 02/16/21   Angiulli, Lavon Paganini, PA-C  diclofenac (VOLTAREN) 75 MG EC tablet Take 75 mg by mouth 2 (two) times daily. 02/13/21   [provider]  docusate sodium (COLACE) 100 MG capsule Take 100 mg by mouth 2 (two) times daily.    [provider]  Multiple Vitamin (MULTIVITAMIN WITH MINERALS) TABS tablet Take 1 tablet by mouth daily.    [provider]  polyethylene glycol (MIRALAX / GLYCOLAX) 17 g packet Take 17 g by mouth 2 (two) times daily. 01/15/21   Little Ishikawa, MD    Allergies    Patient has no known allergies.  Review of Systems   Review of Systems  Musculoskeletal:  Positive for back pain.  Neurological:  Positive for weakness (L leg, today more than normal).  All other systems reviewed and are negative.  Physical Exam Updated Vital Signs BP (!) 124/95 (BP Location: Left Arm)   Pulse 94   Temp 98 F (36.7 C)   Resp 14   SpO2 99%   Physical Exam Vitals and nursing note reviewed.  Constitutional:      General: He is not in acute distress.    Appearance: Normal appearance.  HENT:     Head: Normocephalic and atraumatic.  Eyes:     Conjunctiva/sclera: Conjunctivae normal.     Pupils: Pupils are equal, round, and reactive to light.  Cardiovascular:     Rate and Rhythm: Normal rate and regular rhythm.     Pulses: Normal pulses.  Pulmonary:     Effort: Pulmonary effort is normal. No respiratory distress.     Breath sounds: Normal breath sounds. No wheezing.     Comments: Speaking in full sentences.  Clear lung sounds in all fields. Abdominal:     General: There is no distension.     Palpations: Abdomen is soft. There is no mass.     Tenderness: There is no  abdominal tenderness. There is no guarding or rebound.     Comments: No focal ttp of the abd  Musculoskeletal:        General: Normal range of motion.     Cervical back: Normal range of motion and neck supple.     Comments: Difficulty raising L leg off bed relative to right, slightly worse than baseline per pt. Decreased sensation of L upper leg, normal sensation of L lower leg  Skin:    General: Skin is warm and dry.     Capillary Refill: Capillary refill takes less than 2 seconds.  Neurological:     Mental Status: He is alert and oriented to person, place, and time.  Psychiatric:        Mood and Affect: Mood and affect normal.        Speech: Speech normal.        Behavior: Behavior normal.    ED Results / Procedures / Treatments   Labs (all labs ordered are listed, but only abnormal results are displayed) Labs Reviewed  COMPREHENSIVE METABOLIC PANEL - Abnormal; Notable for the following components:      Result Value   Glucose, Bld 144 (*)    BUN 30 (*)    Albumin 3.3 (*)    All other components within normal limits  CBC WITH DIFFERENTIAL/PLATELET - Abnormal; Notable for the following components:   WBC 12.8 (*)    Hemoglobin 11.9 (*)    HCT 38.8 (*)    Platelets 415 (*)    Neutro Abs 11.2 (*)    All other components within normal limits  CULTURE, BLOOD (ROUTINE X 2)  CULTURE, BLOOD (ROUTINE X 2)  RESP PANEL BY RT-PCR (FLU A&B, COVID) ARPGX2  LACTIC ACID, PLASMA  URINALYSIS, ROUTINE W REFLEX MICROSCOPIC    EKG None  Radiology MR THORACIC SPINE WO CONTRAST  Result Date: 03/14/2021 CLINICAL DATA:  Follow-up epidural abscess. EXAM: MRI THORACIC SPINE WITHOUT CONTRAST TECHNIQUE: Multiplanar, multisequence MR imaging of the  thoracic spine was performed. No intravenous contrast was administered. COMPARISON:  01/28/2021 FINDINGS: Alignment: Mildly increased kyphosis in the lower thoracic spine. Minimal retrolisthesis of T11 on T12 and T12 on L1. Vertebrae: Posterior spinal  fusion instrumentation is again partially visualized beginning at T10 and extending inferiorly into the lumbar spine. There is progressive, extensive marrow edema throughout the T9, T10, and T11 vertebral bodies. This is associated with increased fluid signal in the T10-11 greater than T9-10 disc spaces with associated endplate erosion, most notable at T11 where there is moderate to severe anterior vertebral body height loss. There is a persistent ventral epidural fluid collection which does not extend as far cranially as on the prior MRI (currently T8 and previously T7), however the overall size of the current collection at the T9 and T10 levels is larger than on the prior study and results in increased, moderate spinal stenosis at T9-10 and severe spinal stenosis at T10-11 with moderate cord flattening. Cord:  No definite cord edema. Paraspinal and other soft tissues: Persistent extensive paravertebral inflammation/phlegmon in the lower thoracic spine. Decreased size of loculated bilateral pleural collections. Disc levels: Postoperative an infectious findings in the lower thoracic spine as detailed above. Minor spondylosis and moderate facet arthrosis more superiorly in the thoracic spine without associated spinal stenosis. IMPRESSION: 1. Worsening findings of discitis-osteomyelitis at T9-10 and T10-11 with enlargement of the epidural abscess at these levels resulting in severe spinal stenosis at T10-11. No cord edema. 2. Persistent extensive paravertebral inflammation/phlegmon. Decreased size of loculated bilateral pleural collections. These results will be called to the ordering clinician or representative by the Radiologist Assistant, and communication documented in the PACS or [employer]. Electronically Signed   By: Logan Bores M.D.   On: 03/14/2021 12:15    Procedures .Critical Care Performed by: Franchot Heidelberg, PA-C Authorized by: Franchot Heidelberg, PA-C   Critical care provider statement:     Critical care time (minutes):  40   Critical care time was exclusive of:  Separately billable procedures and treating other patients and teaching time   Critical care was necessary to treat or prevent imminent or life-threatening deterioration of the following conditions:  Sepsis   Critical care was time spent personally by me on the following activities:  Blood draw for specimens, development of treatment plan with patient or surrogate, discussions with consultants, evaluation of patient's response to treatment, examination of patient, obtaining history from patient or surrogate, ordering and performing treatments and interventions, ordering and review of laboratory studies, ordering and review of radiographic studies, pulse oximetry, re-evaluation of patient's condition and review of old charts   I assumed direction of critical care for this patient from another provider in my specialty: no     Care discussed with: admitting provider   Comments:     Pt with worsening discitis and epidural abscess, plan for OR with neurosurg.    Medications Ordered in ED Medications  ceFEPIme (MAXIPIME) 2 g in sodium chloride 0.9 % 100 mL IVPB (has no administration in time range)  vancomycin (VANCOREADY) IVPB 2000 mg/400 mL (has no administration in time range)  vancomycin (VANCOREADY) IVPB 1250 mg/250 mL (has no administration in time range)    ED Course  I have reviewed the triage vital signs and the nursing notes.  Pertinent labs & imaging results that were available during my care of the patient were reviewed by me and considered in my medical decision making (see chart for details).    MDM Rules/Calculators/A&P  Patient presenting for evaluation of abnormal MRI.  On exam, patient appears nontoxic.  He is having slightly increased pain after readjustment.  MRI.  He is also having slightly increased weakness and pain in his left leg, and on my exam has decreased sensation  of the left upper leg.  However no loss of bowel bladder control.  He no fevers or clinical signs of worsening infection.  MRI reviewed by myself, shows worsening infection including worsening epidural abscess resulting in severe spinal stenosis.  Will consult with neurosurgery.  Regarding worsening infection, will consult with ID regarding antibiotic management.  Discussed findings and plan with patient and wife, who are agreeable.  While patient does not have worsening signs of infection, his abdomen abdominal exam is overall reassuring, he does report worsening bloating and with a recent history of the psoas abscess, will obtain CT abdomen pelvis with contrast for completeness and to ensure no new process or acute intra-abdominal infection.  Discussed with Dr. Juleen China from infectious disease who recommends placing patient on Vanco and cefepime.  They will consult while patient is admitted.  Discussed with Dr. Kathyrn Sheriff from neurosurgery who reviewed the images and recommends patient go back to the OR today.  Request patient be kept n.p.o.  Discussed with internal medicine teaching service, pt to be admitted.   Final Clinical Impression(s) / ED Diagnoses Final diagnoses:  Epidural abscess  Discitis of thoracic region    Rx / DC Orders ED Discharge Orders     None        Franchot Heidelberg, PA-C 03/14/21 1548    Dorie Rank, MD 03/15/21 1007

## 2021-03-14 NOTE — ED Triage Notes (Signed)
Pt was having a follow-up outpatient thoracic spine MRI today  (s/p epidural abscess) prior to having PICC line removed.  States after MRI they lifted his legs to remove foam from under legs and he felt a pop in his back.  Reports increased lower back pain with pain in lateral sides that radiates to abd.  States he told them he may need to stay for awhile and after they read the MRI they told him to come to ED.

## 2021-03-14 NOTE — Consult Note (Signed)
Chief Complaint  Back pain, leg pain  History of Present Illness  Marco Kennedy is a 70 y.o. male with a history of thoracolumbar instrumented fusion for correction of scoliosis about 1 year ago.  Several months ago patient read presented with wound infection requiring operative exploration and washout.  At the same time he was found to have bilateral pleural empyema and abdominal infection.  After lengthy hospital stay, he was discharged to rehab and subsequently back home.  He has been following with infectious disease and getting IV antibiotics.  He had routine MRI scan scheduled for this morning.  While being positioned on the scanner, he noted sudden onset of relatively severe pain running through the anterior aspect of his legs bilaterally.  Subsequent to the scan, he had a hard time getting off the imaging table.  He was referred to the emergency department for further evaluation.  Upon questioning today, he does note worsening of back and leg pain since the MRI this morning.  He notes sudden, sharp episodes of pain from the lower back radiating into the anterior aspect of both legs generally brought on by changes in position.  He does not report any new consistent paresthesias in the legs.  No new weakness in the legs.  Past Medical History   Past Medical History:  Diagnosis Date   Arthritis    arthritis-bilateral knees, left Hip.   Degenerative lumbar spinal stenosis    EKG abnormality    06-08-16- being evaluated by cardiologist  in [place]   Idiopathic scoliosis of lumbar region    Joint stiffness    Knee pain    Leg swelling    Low back pain    Achy, shooting pain.  Hurts to Walk or stand up straight.   Lumbar radiculopathy    Scoliosis concern    Sleep apnea    Bipap use nightly 25/19 -3l/m oxygen   Spondylolisthesis of lumbar region    Transfusion history    [place] ,[place] after bleeding post colon polyp removal and colonscopy procedure    Past Surgical History    Past Surgical History:  Procedure Laterality Date   ABDOMINAL EXPOSURE N/A 04/18/2020   Procedure: ABDOMINAL EXPOSURE;  Surgeon: Marty Heck, MD;  Location: [facility];  Service: Vascular;  Laterality: N/A;   ANTERIOR LAT LUMBAR FUSION Left 04/18/2020   Procedure: ANTERIOR LATERAL LUMBAR FUSION LUMBAR TWO-THREE, LUMBAR THREE-FOUR;  Surgeon: Erline Levine, MD;  Location: [facility];  Service: Neurosurgery;  Laterality: Left;   ANTERIOR LUMBAR FUSION N/A 04/18/2020   Procedure: Lumbar Four-Five Lumbar Five- Sacral One Anterior lumbar Interbody Fusion;  Surgeon: Erline Levine, MD;  Location: [facility];  Service: Neurosurgery;  Laterality: N/A;   APPLICATION OF INTRAOPERATIVE CT SCAN N/A 04/21/2020   Procedure: APPLICATION OF INTRAOPERATIVE CT SCAN;  Surgeon: Erline Levine, MD;  Location: [facility];  Service: Neurosurgery;  Laterality: N/A;   BACK SURGERY     lumbar disc surgery   COLONOSCOPY W/ POLYPECTOMY     HERNIA REPAIR Left    left inguinal hernia repair   IR RADIOLOGIST EVAL & MGMT  02/24/2021   KNEE ARTHROSCOPY Right 2003    -scope for meniscus   LUMBAR WOUND DEBRIDEMENT N/A 01/13/2021   Procedure: THORACOLUMBAR WOUND DEBRIDEMENT;  Surgeon: Erline Levine, MD;  Location: [facility];  Service: Neurosurgery;  Laterality: N/A;   POSTERIOR LUMBAR FUSION 4 LEVEL N/A 04/21/2020   Procedure: Thoracic ten to pelvis pedicle screw fixation with osteotomies;  Surgeon: Erline Levine, MD;  Location: [facility];  Service: Neurosurgery;  Laterality: N/A;   TOTAL HIP ARTHROPLASTY Left 06/15/2016   Procedure: LEFT TOTAL HIP ARTHROPLASTY ANTERIOR APPROACH;  Surgeon: Paralee Cancel, MD;  Location: [facility];  Service: Orthopedics;  Laterality: Left;    Social History   Social History   Tobacco Use   Smoking status: Never   Smokeless tobacco: Never  Vaping Use   Vaping Use: Never used  Substance Use Topics   Alcohol use: Not Currently    Comment: rare- social    Drug use: No    Medications   Prior to Admission  medications   Medication Sig Start Date End Date Taking? Authorizing Provider  acetaminophen (TYLENOL) 325 MG tablet Take 2 tablets (650 mg total) by mouth every 6 (six) hours as needed for mild pain (or Fever >/= 101). 02/03/21  Yes Rizwan, Saima, MD  amLODipine (NORVASC) 5 MG tablet Take 1 tablet (5 mg total) by mouth daily. 02/16/21  Yes Angiulli, Lavon Paganini, PA-C  ceFAZolin (ANCEF) 2-4 GM/100ML-% IVPB Inject 100 mLs (2 g total) into the vein every 8 (eight) hours. 02/16/21  Yes Angiulli, Lavon Paganini, PA-C  cholecalciferol (VITAMIN D3) 25 MCG (1000 UNIT) tablet Take 1 tablet (1,000 Units total) by mouth daily. 02/16/21  Yes Angiulli, Lavon Paganini, PA-C  diclofenac (VOLTAREN) 75 MG EC tablet Take 75 mg by mouth 2 (two) times daily. 02/13/21  Yes [provider]  docusate sodium (COLACE) 100 MG capsule Take 100 mg by mouth daily.   Yes [provider]  Multiple Vitamin (MULTIVITAMIN WITH MINERALS) TABS tablet Take 1 tablet by mouth daily.   Yes [provider]  oxyCODONE (OXY IR/ROXICODONE) 5 MG immediate release tablet Take 5 mg by mouth every 6 (six) hours as needed for severe pain.   Yes [provider]  polyethylene glycol (MIRALAX / GLYCOLAX) 17 g packet Take 17 g by mouth 2 (two) times daily. Patient taking differently: Take 17 g by mouth daily as needed for mild constipation. 01/15/21  Yes Little Ishikawa, MD  ceFAZolin (ANCEF) IVPB Inject 2 g into the vein every 8 (eight) hours. Indication:  abscess and empyema First Dose: Yes Last Day of Therapy:  03/11/21 Labs - Once weekly:  CBC/D and BMP, Labs - Every other week:  ESR and CRP Method of administration: IV Push Method of administration may be changed at the discretion of home infusion pharmacist based upon assessment of the patient and/or caregiver's ability to self-administer the medication ordered. Patient not taking: Reported on 03/14/2021 02/12/21 03/18/21  Angiulli, Lavon Paganini, PA-C  colchicine 0.6 MG tablet  Take 1 tablet (0.6 mg total) by mouth daily. Patient not taking: No sig reported 02/16/21   Angiulli, Lavon Paganini, PA-C    Allergies  No Known Allergies  Review of Systems  ROS  Neurologic Exam  Awake, alert, oriented Memory and concentration grossly intact Speech fluent, appropriate CN grossly intact Motor exam: Upper Extremities Deltoid Bicep Tricep Grip  Right 5/5 5/5 5/5 5/5  Left 5/5 5/5 5/5 5/5   Lower Extremities IP Quad PF DF EHL  Right 5/5 5/5 4/5 5/5 4/5  Left 5/5 5/5 4/5 5/5 4/5   Sensation grossly intact to LT  Imaging  MRI of the thoracic spine was personally reviewed.  This again demonstrates hardware artifact from T10 down.  Of note, there is enhancing epidural tissue within the ventral epidural space extending from approximately T9-10 down to approximately the T10-11 region.  This appears to be increased  in thickness in comparison to prior MRI of about a month ago.  There is now moderate to severe stenosis at T10-11 and up to moderate stenosis at T9-10.  Again seen is evidence of discitis and osteomyelitis at T9-10 and T10-11, with some kyphosis centered about the T9-10 region.  Impression  - 70 y.o. male nearly 1 year status post thoracolumbar fusion complicated by infection including osteomyelitis discitis.  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  This is associated with a proximal junctional kyphosis.   Plan  -We will proceed with T9-T10 laminectomy and debridement of abscess -Patient to be admitted to medicine with infectious disease consult.  Suspect he will need widening of his antibiotic coverage given worsening of abscess despite several months of IV treatment  I have reviewed the imaging findings with the patient and his wife.  We have discussed my recommendation for surgical laminectomy and debridement of abscess.  We have discussed the details of the procedure as well as the  expected postoperative course.  I have reviewed with him the risks of the procedure including risk of spinal cord or nerve injury, more superficial wound infection/wound nonhealing, bleeding, and spinal fluid leak.  All their questions today were answered.  Patient provided verbal and written consent to proceed.  Consuella Lose, MD [facility]

## 2021-03-14 NOTE — Progress Notes (Signed)
Pharmacy Antibiotic Note  Marco Kennedy is a 70 y.o. male admitted on 03/14/2021 with  enlarging epidural abscess and worsening disicitis/osteomyelitis .  Pharmacy has been consulted to broaden antibiotic therapy from home cefazolin to cefepime and vancomycin   WBC elevated at 12.8, SCr wnl, AF.   Plan: -Vancomycin 2 gm IV load followed by Vancomycin 1250 mg IV Q 12 hrs. Goal AUC 400-550. Expected AUC: 515 SCr used: 0.76 -Cefepime 2 gm IV Q 8 hours  -Monitor CBC, renal fx, cultures and clinical progress  -Vanc levels as indicated     Temp (24hrs), Avg:98 F (36.7 C), Min:98 F (36.7 C), Max:98 F (36.7 C)  Recent Labs  Lab 03/14/21 1347  WBC 12.8*  CREATININE 0.76  LATICACIDVEN 1.5    Estimated Creatinine Clearance: 99.8 mL/min (by C-G formula based on SCr of 0.76 mg/dL).    No Known Allergies  Antimicrobials this admission: Cefepime 9/17 >>  Vancomycin 9/17 >>   Dose adjustments this admission:  Microbiology results: 9/17 BCx:  9/17 UCx:     Thank you for allowing pharmacy to be a part of this patient's care.  Vinnie Level, PharmD., BCPS, BCCCP Clinical Pharmacist Please refer to [facility] for unit-specific pharmacist

## 2021-03-14 NOTE — Hospital Course (Addendum)
#  Epidural abscess Patient presented to the ED after a follow-up MRI for epidural abscess around hardware in his thoracic and lumbar spines, as well as previously noted retroperitoneal fluid collection.  During the MRI, the patient experienced acute worsening of low back pain that radiated to his lower extremities.  MRI was read and showed worsening findings of discitis/osteomyelitis at T9-10 and T10-11 with enlargement of epidural abscess at these levels resulting in severe spinal stenosis at T10-11 with no cord edema, persistent extensive paravertebral inflammation/phlegmon, decreased size of loculated bilateral pleural collections, prompting patient to present to ED.  At that time he did have a leukocytosis of 12.8.  Blood cultures were drawn and showed ***.  He had been on IV cefazolin, however this was brought into vancomycin and cefepime on admission.  Neurosurgery did take him to the OR for T9-T10 laminectomy debridement of abscess on 9/17.  Cultures collected at that time showed no growth.  IR was consulted for drainage of the left retroperitoneal fluid collection noted on imaging and drained 20 mL of serous aspirate on 9/19.  At this time, drainage catheter was placed.  Cultures were collected on this fluid as well and showed no growth.  On 9/19, the patient was transitioned to ertapenem 1 g IM daily.  This was further modified on 9/22 by stopping ertapenem and starting cefazolin 2 g IV 3 times daily.  On 9/23, he was initiated on Lovenox 40 mg injection.  On 9/24, a repeat MRI showed postoperative changes from interval decompressive laminectomy at T9 and T10 with improved stenosis at these levels, persistent severe spinal stenosis just below the operative site at T10-11 with moderate cord flattening, persistent osteomyelitis discitis at T9-10 and T10-11, slightly decreased in size of ventral epidural abscess extending from T8-T11, persistent paravertebral inflammation/phlegmon with persistent irregular  loculated bilateral pleural collections; repeat CT abdomen/pelvis showed left pelvic collection that had been decompressed with percutaneous pigtail catheter, adjacent inflammatory changes decreased from previous imaging, no acute abnormalities within the abdomen or pelvis.  Drain catheter was removed 9/26.  On 9/27 he was reevaluated for procedure timeline and was scheduled for repeat surgery on the thoracic spine on 03/31/2021.  #Hypertension Chronically managed with amlodipine 5 mg p.o. daily at home.  This was held until 9/20.  #Constipation Patient was unable to have bowel movements throughout his admission.  Bowel regimen included MiraLAX, Senokot 2 tablets daily.  He was given 1 dose of milk of magnesia 15 mL on 9/23.  On 9/24 Dulcolax 10 mg daily was added to his regimen.  On 9/25, lactulose enema was added to this regimen.  This was advanced further with bisacodyl suppository on 9/26 which allowed very minimal bowel movement that day.  He was continued on intensive bowel regimen and began having bowel movements on 9/27.

## 2021-03-14 NOTE — Progress Notes (Signed)
Called re MRI findings. Pt has extensive hx of thoracolumbar fusion nearly one year ago.  He read presented to the hospital several months ago with wound infection requiring operative wound exploration and washout.  At that time patient was also found to have pleural empyema and some abdominal infection.  Patient has since been on antibiotics and follows up with infectious disease.  Routine follow-up MRI of the thoracic spine was scheduled as an outpatient today.  After review of the scan he was sent to the emergency department.  I have had an opportunity to review the scan and compared to the prior MRI.  This demonstrates evidence of discitis and osteomyelitis especially at the T9, 10, and 11 levels.  There is kyphosis centered about the T9-T10 level just above the prior instrumented construct.  There is also ventral epidural abscess which is increased in size in comparison to the prior MRI now with moderate stenosis at T9-10 and moderate to severe stenosis at T10-11.  Given the worsening in size of the abscess and now significant stenosis, I think this will require operative debridement.  We will plan on surgery later this afternoon.   Lisbeth Renshaw, MD [facility]

## 2021-03-14 NOTE — ED Notes (Signed)
Patient transported to CT 

## 2021-03-14 NOTE — Anesthesia Preprocedure Evaluation (Addendum)
Anesthesia Evaluation  Patient identified by MRN, date of birth, ID band Patient awake    Reviewed: Allergy & Precautions, NPO status , Patient's Chart, lab work & pertinent test results  Airway Mallampati: II  TM Distance: >3 FB Neck ROM: Full    Dental  (+) Teeth Intact, Chipped   Pulmonary sleep apnea and Continuous Positive Airway Pressure Ventilation ,    Pulmonary exam normal        Cardiovascular negative cardio ROS   Rhythm:Regular Rate:Normal     Neuro/Psych negative neurological ROS  negative psych ROS   GI/Hepatic negative GI ROS, Neg liver ROS,   Endo/Other  negative endocrine ROS  Renal/GU   negative genitourinary   Musculoskeletal  (+) Arthritis , Osteoarthritis,  Back abscess    Abdominal (+)  Abdomen: soft.    Peds  Hematology  (+) anemia ,   Anesthesia Other Findings   Reproductive/Obstetrics                            Anesthesia Physical Anesthesia Plan  ASA: 2 and emergent  Anesthesia Plan: General   Post-op Pain Management:    Induction: Intravenous  PONV Risk Score and Plan: 2 and Ondansetron, Dexamethasone and Treatment may vary due to age or medical condition  Airway Management Planned: Mask and Oral ETT  Additional Equipment: None  Intra-op Plan:   Post-operative Plan: Extubation in OR  Informed Consent: I have reviewed the patients History and Physical, chart, labs and discussed the procedure including the risks, benefits and alternatives for the proposed anesthesia with the patient or authorized representative who has indicated his/her understanding and acceptance.     Dental advisory given  Plan Discussed with: CRNA  Anesthesia Plan Comments: (Lab Results      Component                Value               Date                      WBC                      12.8 (H)            03/14/2021                HGB                      11.9 (L)             03/14/2021                HCT                      38.8 (L)            03/14/2021                MCV                      89.4                03/14/2021                PLT                      415 (H)  03/14/2021           Lab Results      Component                Value               Date                      NA                       138                 03/14/2021                K                        4.2                 03/14/2021                CO2                      27                  03/14/2021                GLUCOSE                  144 (H)             03/14/2021                BUN                      30 (H)              03/14/2021                CREATININE               0.76                03/14/2021                CALCIUM                  9.5                 03/14/2021                GFRNONAA                 >60                 03/14/2021                GFRAA                    >60                 06/16/2016           ECHO 07/22: 1. Left ventricular ejection fraction, by estimation, is 65 to 70%. The  left ventricle has normal function. The left ventricle has no regional  wall motion abnormalities. There is mild left ventricular hypertrophy.  Left ventricular diastolic parameters  were normal.  2. Right ventricular systolic function is normal. The right ventricular  size is normal. Tricuspid regurgitation signal is  inadequate for assessing  PA pressure.  3. The mitral valve is normal in structure. No evidence of mitral valve  regurgitation.  4. The aortic valve was not well visualized. Aortic valve regurgitation  is not visualized. No aortic stenosis is present.  5. The inferior vena cava is normal in size with greater than 50%  respiratory variability, suggesting right atrial pressure of 3 mmHg. )       Anesthesia Quick Evaluation

## 2021-03-14 NOTE — Op Note (Signed)
NEUROSURGERY OPERATIVE NOTE   PREOP DIAGNOSIS:  Thoracic spinal epidural abscess T9-T10   POSTOP DIAGNOSIS: Same  PROCEDURE: T9-T10 laminectomy for decompression of thecal sac  SURGEON: Dr. Lisbeth Renshaw, MD  ASSISTANT: None  ANESTHESIA: General Endotracheal  EBL: 50cc  SPECIMENS: epidural culture swabs  DRAINS: Subfacial small hemovac drain  COMPLICATIONS: none immediate  CONDITION: Hemodynamically stable to PACU  HISTORY: Marco Kennedy is a 70 y.o. male with a previous history of thoracolumbar deformity correction.  Several months ago he was admitted with wound infection with concurrent pleural empyema and abdominal infection.  He has been on IV antibiotics and presented today in the emergency department after electively scheduled MRI scan revealed worsening spinal epidural abscess with moderate to severe compression.  Surgical decompression was therefore indicated.  The risks, benefits, and alternatives to surgery were reviewed in detail with the patient and his wife.  After all questions were answered informed consent was obtained and witnessed.  PROCEDURE IN DETAIL: The patient was brought to the operating room. After induction of general anesthesia, the patient was positioned on the operative table in the prone position. All pressure points were meticulously padded.  Superior aspect of the previous skin incision was then marked out and prepped and draped in the usual sterile fashion.  After timeout was conducted, the spinal needle was introduced and intraoperative lateral x-ray was taken to identify the top of the construct.  This region was then opened sharply and Bovie electrocautery was used to dissect through subcutaneous tissue until the thoracodorsal fascia was identified and incised.  Spinous processes were then in identified and significant amount of fibrosis was encountered.  This was dissected along the level of the lamina laterally and self-retaining  retractors were placed.  A dissector was then placed in the identified interspace and noted to be the bottom of the T8 lamina at the top of the planned decompression.  Further dissection was carried out inferiorly to identify the T9 and T10 lamina.  Self-retaining retractors were then placed.  Using rongeurs, the spinous processes were removed as was significant fibrotic tissue overlying the lamina.  High-speed drill was then used to complete the laminectomy at T9 and T10.  I did note significant adhesion of the ligamentum flavum to the dorsal aspect of the dura.  Kerrison punches were used to widen the laminectomy until the lateral edges of the thecal sac were identified.  Again, there was significant amount of epidural fibrosis and I was able to place a small ball dissector on the lateral edge of the thecal sac and into the ventral epidural space however I was unable to place a larger dissector within the space.  In order to access the ventral epidural space it would have required resection of the facet complex and likely removal of the previous pedicle screws and transpedicular approach which I did not feel would be worthwhile especially given the fact that the patient does have a proximal junctional kyphosis which may require fixation at a later time.  I did feel that he was well decompressed dorsally and I therefore elected to not proceed with more radical exposure of the ventral epidural space.  The region was irrigated with normal saline.  Hemostasis was secured with morselized Gelfoam with thrombin.  A small Hemovac drain was placed and tunneled subcutaneously.  The muscle was then reapproximated with interrupted 0 Vicryl stitches.  The thoracodorsal fascia was reapproximated with interrupted 0 Vicryl stitches and the deep subcutaneous layer was closed with interrupted 0  Vicryl.  Skin was closed with staples.  Drain was tacked in place with 0 Vicryl.  Bacitracin ointment and sterile dressing was applied.   At the end of the case all sponge, needle, instrument, and cottonoid counts were correct.  Patient was then extubated and taken to the postanesthesia care unit in stable hemodynamic condition.    Lisbeth Renshaw, MD [facility]

## 2021-03-14 NOTE — ED Notes (Signed)
First attempt at cultures failed, EDP aware that pt is a hard stick

## 2021-03-14 NOTE — Anesthesia Procedure Notes (Signed)
Procedure Name: Intubation Date/Time: 03/14/2021 5:57 PM Performed by: Tressia Miners, CRNA Pre-anesthesia Checklist: Patient identified, Emergency Drugs available, Suction available and Patient being monitored Patient Re-evaluated:Patient Re-evaluated prior to induction Oxygen Delivery Method: Circle System Utilized Preoxygenation: Pre-oxygenation with 100% oxygen Induction Type: IV induction and Cricoid Pressure applied Ventilation: Mask ventilation without difficulty Laryngoscope Size: Glidescope and 4 Grade View: Grade I Tube type: Oral Tube size: 7.5 mm Number of attempts: 1 Airway Equipment and Method: Stylet Placement Confirmation: ETT inserted through vocal cords under direct vision, positive ETCO2 and breath sounds checked- equal and bilateral Secured at: 23 cm Tube secured with: Tape Dental Injury: Teeth and Oropharynx as per pre-operative assessment

## 2021-03-15 DIAGNOSIS — Z981 Arthrodesis status: Secondary | ICD-10-CM | POA: Diagnosis not present

## 2021-03-15 DIAGNOSIS — M62838 Other muscle spasm: Secondary | ICD-10-CM | POA: Diagnosis present

## 2021-03-15 DIAGNOSIS — M4316 Spondylolisthesis, lumbar region: Secondary | ICD-10-CM | POA: Diagnosis present

## 2021-03-15 DIAGNOSIS — A419 Sepsis, unspecified organism: Secondary | ICD-10-CM | POA: Diagnosis not present

## 2021-03-15 DIAGNOSIS — R269 Unspecified abnormalities of gait and mobility: Secondary | ICD-10-CM | POA: Diagnosis present

## 2021-03-15 DIAGNOSIS — M4804 Spinal stenosis, thoracic region: Secondary | ICD-10-CM | POA: Diagnosis present

## 2021-03-15 DIAGNOSIS — Z8261 Family history of arthritis: Secondary | ICD-10-CM | POA: Diagnosis not present

## 2021-03-15 DIAGNOSIS — D62 Acute posthemorrhagic anemia: Secondary | ICD-10-CM | POA: Diagnosis present

## 2021-03-15 DIAGNOSIS — E669 Obesity, unspecified: Secondary | ICD-10-CM | POA: Diagnosis present

## 2021-03-15 DIAGNOSIS — R202 Paresthesia of skin: Secondary | ICD-10-CM | POA: Diagnosis present

## 2021-03-15 DIAGNOSIS — K6819 Other retroperitoneal abscess: Secondary | ICD-10-CM | POA: Diagnosis present

## 2021-03-15 DIAGNOSIS — M4644 Discitis, unspecified, thoracic region: Secondary | ICD-10-CM

## 2021-03-15 DIAGNOSIS — G4733 Obstructive sleep apnea (adult) (pediatric): Secondary | ICD-10-CM | POA: Diagnosis present

## 2021-03-15 DIAGNOSIS — F32A Depression, unspecified: Secondary | ICD-10-CM | POA: Diagnosis present

## 2021-03-15 DIAGNOSIS — M48061 Spinal stenosis, lumbar region without neurogenic claudication: Secondary | ICD-10-CM | POA: Diagnosis not present

## 2021-03-15 DIAGNOSIS — E119 Type 2 diabetes mellitus without complications: Secondary | ICD-10-CM | POA: Diagnosis present

## 2021-03-15 DIAGNOSIS — M4620 Osteomyelitis of vertebra, site unspecified: Secondary | ICD-10-CM | POA: Diagnosis not present

## 2021-03-15 DIAGNOSIS — Z79899 Other long term (current) drug therapy: Secondary | ICD-10-CM | POA: Diagnosis not present

## 2021-03-15 DIAGNOSIS — K6812 Psoas muscle abscess: Secondary | ICD-10-CM | POA: Diagnosis present

## 2021-03-15 DIAGNOSIS — G061 Intraspinal abscess and granuloma: Secondary | ICD-10-CM | POA: Diagnosis present

## 2021-03-15 DIAGNOSIS — Z800 Family history of malignant neoplasm of digestive organs: Secondary | ICD-10-CM | POA: Diagnosis not present

## 2021-03-15 DIAGNOSIS — F419 Anxiety disorder, unspecified: Secondary | ICD-10-CM | POA: Diagnosis present

## 2021-03-15 DIAGNOSIS — M4126 Other idiopathic scoliosis, lumbar region: Secondary | ICD-10-CM | POA: Diagnosis present

## 2021-03-15 DIAGNOSIS — E46 Unspecified protein-calorie malnutrition: Secondary | ICD-10-CM | POA: Diagnosis present

## 2021-03-15 DIAGNOSIS — J869 Pyothorax without fistula: Secondary | ICD-10-CM

## 2021-03-15 DIAGNOSIS — Z7401 Bed confinement status: Secondary | ICD-10-CM | POA: Diagnosis not present

## 2021-03-15 DIAGNOSIS — G8918 Other acute postprocedural pain: Secondary | ICD-10-CM | POA: Diagnosis not present

## 2021-03-15 DIAGNOSIS — G629 Polyneuropathy, unspecified: Secondary | ICD-10-CM | POA: Diagnosis present

## 2021-03-15 DIAGNOSIS — Z8249 Family history of ischemic heart disease and other diseases of the circulatory system: Secondary | ICD-10-CM | POA: Diagnosis not present

## 2021-03-15 DIAGNOSIS — L89151 Pressure ulcer of sacral region, stage 1: Secondary | ICD-10-CM | POA: Diagnosis not present

## 2021-03-15 DIAGNOSIS — G062 Extradural and subdural abscess, unspecified: Secondary | ICD-10-CM

## 2021-03-15 DIAGNOSIS — Z6837 Body mass index (BMI) 37.0-37.9, adult: Secondary | ICD-10-CM | POA: Diagnosis not present

## 2021-03-15 DIAGNOSIS — Z792 Long term (current) use of antibiotics: Secondary | ICD-10-CM | POA: Diagnosis not present

## 2021-03-15 DIAGNOSIS — K5900 Constipation, unspecified: Secondary | ICD-10-CM | POA: Diagnosis not present

## 2021-03-15 DIAGNOSIS — K589 Irritable bowel syndrome without diarrhea: Secondary | ICD-10-CM | POA: Diagnosis present

## 2021-03-15 DIAGNOSIS — R0902 Hypoxemia: Secondary | ICD-10-CM | POA: Diagnosis present

## 2021-03-15 DIAGNOSIS — M4624 Osteomyelitis of vertebra, thoracic region: Secondary | ICD-10-CM | POA: Diagnosis present

## 2021-03-15 DIAGNOSIS — A4151 Sepsis due to Escherichia coli [E. coli]: Secondary | ICD-10-CM | POA: Diagnosis not present

## 2021-03-15 DIAGNOSIS — R0989 Other specified symptoms and signs involving the circulatory and respiratory systems: Secondary | ICD-10-CM | POA: Diagnosis not present

## 2021-03-15 DIAGNOSIS — R188 Other ascites: Secondary | ICD-10-CM | POA: Diagnosis not present

## 2021-03-15 DIAGNOSIS — E8809 Other disorders of plasma-protein metabolism, not elsewhere classified: Secondary | ICD-10-CM | POA: Diagnosis not present

## 2021-03-15 DIAGNOSIS — G8929 Other chronic pain: Secondary | ICD-10-CM | POA: Diagnosis present

## 2021-03-15 DIAGNOSIS — L89321 Pressure ulcer of left buttock, stage 1: Secondary | ICD-10-CM | POA: Diagnosis not present

## 2021-03-15 DIAGNOSIS — M109 Gout, unspecified: Secondary | ICD-10-CM | POA: Diagnosis present

## 2021-03-15 DIAGNOSIS — Z23 Encounter for immunization: Secondary | ICD-10-CM | POA: Diagnosis not present

## 2021-03-15 DIAGNOSIS — Z96642 Presence of left artificial hip joint: Secondary | ICD-10-CM | POA: Diagnosis present

## 2021-03-15 DIAGNOSIS — E871 Hypo-osmolality and hyponatremia: Secondary | ICD-10-CM | POA: Diagnosis present

## 2021-03-15 DIAGNOSIS — I10 Essential (primary) hypertension: Secondary | ICD-10-CM | POA: Diagnosis present

## 2021-03-15 DIAGNOSIS — Z20822 Contact with and (suspected) exposure to covid-19: Secondary | ICD-10-CM | POA: Diagnosis present

## 2021-03-15 DIAGNOSIS — K5903 Drug induced constipation: Secondary | ICD-10-CM | POA: Diagnosis not present

## 2021-03-15 LAB — BASIC METABOLIC PANEL
Anion gap: 13 (ref 5–15)
BUN: 20 mg/dL (ref 8–23)
CO2: 26 mmol/L (ref 22–32)
Calcium: 9 mg/dL (ref 8.9–10.3)
Chloride: 98 mmol/L (ref 98–111)
Creatinine, Ser: 0.7 mg/dL (ref 0.61–1.24)
GFR, Estimated: >60 mL/min (ref >60)
Glucose, Bld: 146 mg/dL — ABNORMAL HIGH (ref 70–99)
Potassium: 4.3 mmol/L (ref 3.5–5.1)
Sodium: 137 mmol/L (ref 135–145)

## 2021-03-15 LAB — CBC WITH DIFFERENTIAL/PLATELET
Abs Immature Granulocytes: 0.1 10*3/uL — ABNORMAL HIGH (ref 0.00–0.07)
Basophils Absolute: 0 10*3/uL (ref 0.0–0.1)
Basophils Relative: 0 %
Eosinophils Absolute: 0 10*3/uL (ref 0.0–0.5)
Eosinophils Relative: 0 %
HCT: 30.4 % — ABNORMAL LOW (ref 39.0–52.0)
Hemoglobin: 9.5 g/dL — ABNORMAL LOW (ref 13.0–17.0)
Immature Granulocytes: 1 %
Lymphocytes Relative: 6 %
Lymphs Abs: 0.8 10*3/uL (ref 0.7–4.0)
MCH: 27.3 pg (ref 26.0–34.0)
MCHC: 31.3 g/dL (ref 30.0–36.0)
MCV: 87.4 fL (ref 80.0–100.0)
Monocytes Absolute: 1.1 10*3/uL — ABNORMAL HIGH (ref 0.1–1.0)
Monocytes Relative: 7 %
Neutro Abs: 12.3 10*3/uL — ABNORMAL HIGH (ref 1.7–7.7)
Neutrophils Relative %: 86 %
Platelets: 406 10*3/uL — ABNORMAL HIGH (ref 150–400)
RBC: 3.48 MIL/uL — ABNORMAL LOW (ref 4.22–5.81)
RDW: 14.2 % (ref 11.5–15.5)
WBC: 14.3 10*3/uL — ABNORMAL HIGH (ref 4.0–10.5)
nRBC: 0 % (ref 0.0–0.2)

## 2021-03-15 MED ORDER — WHITE PETROLATUM EX OINT
TOPICAL_OINTMENT | CUTANEOUS | Status: AC
  Filled 2021-03-15: qty 28.35

## 2021-03-15 MED ORDER — CHLORHEXIDINE GLUCONATE CLOTH 2 % EX PADS
6.0000 | MEDICATED_PAD | Freq: Every day | CUTANEOUS | Status: DC
  Administered 2021-03-15 – 2021-04-03 (×19): 6 via TOPICAL

## 2021-03-15 NOTE — Plan of Care (Signed)

## 2021-03-15 NOTE — Anesthesia Postprocedure Evaluation (Signed)
Anesthesia Post Note  Patient: Marco Kennedy  Procedure(s) Performed: THORACIC LAMINECTOMY, THORACIC NINE-THORACIC TEN (Spine Thoracic) WOUND EXPLORATION AND DEBRIDEMENT OF ABSCESS (Spine Thoracic)     Patient location during evaluation: PACU Anesthesia Type: General Level of consciousness: awake and alert Pain management: pain level controlled Vital Signs Assessment: post-procedure vital signs reviewed and stable Respiratory status: spontaneous breathing, nonlabored ventilation, respiratory function stable and patient connected to nasal cannula oxygen Cardiovascular status: blood pressure returned to baseline and stable Postop Assessment: no apparent nausea or vomiting Anesthetic complications: no   No notable events documented.  Last Vitals:  Vitals:   03/15/21 1230 03/15/21 2049  BP: 120/70 131/65  Pulse: (!) 110 89  Resp: 20 16  Temp: 37.1 C   SpO2: 96% 96%    Last Pain:  Vitals:   03/15/21 1741  TempSrc:   PainSc: 4                  Lanah Steines P Krissie Merrick

## 2021-03-15 NOTE — Progress Notes (Signed)
Date: 03/15/2021  Patient name: Marco Kennedy  Medical record number: [id number]  Date of birth: 01-Aug-1950   I have seen and evaluated Unk Lightning and discussed their care with the Residency Team.  In brief, patient is a 70 year old male with past medical history of chronic lower back pain, hypertension, recent epidural abscess in July 2022 status post IR aspiration and subsequent debridement with neurosurgery, recent admission for hypoxemia secondary to bilateral empyemas status post chest tube placement as well as a large left retroperitoneal fluid collection drained by IR who presented to the ED with worsening back pain x1 day.  Patient went to get an MRI yesterday of his back at the recommendation of his infectious disease doctor.  While the patient was getting his MRI he developed worsening back pain which radiated down both his lower extremities.  MRI was read at that time and showed worsening epidural abscess with severe spinal stenosis at T10-T11 and he was referred to the ED for further evaluation.  Patient was seen by neurosurgery in ED and was taken for T9-T10 laminectomy for decompression of thecal sac and debridement of abscess.  No chest pain, no shortness of breath, no palpitation, no lightheadedness, no syncope, no fevers or chills, no headache, no blurry vision, no nausea or vomiting, no diarrhea, no abdominal pain.  Today, patient states that his back pain has improved a little bit he still has some pain at the surgical site.  PMHx, Fam Hx, and/or Soc Hx : As per resident admit note  Vitals:   03/14/21 2115 03/15/21 0718  BP: 138/71 (!) 141/74  Pulse: 60   Resp: 17 18  Temp: 97.8 F (36.6 C) 98.1 F (36.7 C)  SpO2: 97% 96%   General: Awake, alert, oriented x3, NAD CVS: Regular rhythm, normal heart sounds Lungs: CTA bilaterally Abdomen: Soft, nontender, nondistended, normoactive bowel sounds Extremities: No edema noted, nontender to palpation Psych: Normal mood  and affect HEENT: Normocephalic and atraumatic Skin: Warm and dry, drain in place from surgical site with serosanguineous drainage Neuro: Power 5 out of 5 bilateral upper extremities and right lower extremity with 4 out of 5 power in left hip flexion (chronic per patient since his last surgery).  Sensation intact  Assessment and Plan: I have seen and evaluated the patient as outlined above. I agree with the formulated Assessment and Plan as detailed in the residents' note, with the following changes:   1.  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 -Neurosurgery follow-up recommendations appreciated. -Patient is now status post T9-T10 laminectomy with debridement of abscess -Continue pain control for now -ID follow-up and recommendations appreciated.  We will continue with cefepime and vancomycin for now -We will follow blood cultures as well as OR cultures.  Patient previously was treated for E. coli when he had his prior infection -Patient also noted to have a large retroperitoneal fluid collection which is increased since prior.  He will need drainage by IR of this collection as well as repeat cultures from this fluid -Patient also had a prior history of empyema and was noted to have decreased size of his right-sided loculated pleural effusion but effusion is still persistent.  We will discuss with CT surgery if any further intervention is required for this -PT/OT to evaluate patient -No further work-up at  this time  Earl Lagos, MD 9/18/202212:45 PM

## 2021-03-15 NOTE — Consult Note (Signed)
[facility]    Date of Admission:  03/14/2021     Reason for Consult:Epidural abscess     Referring Physician: IMTS  Current antibiotics: Vanc 9/17-pres Cefepime 9/17-pres  Previous antibiotics: Cefazolin PTA    ASSESSMENT:    70 y.o. male admitted with:  Hardware related vertebral osteomyelitis/epidural abscess: Previously secondary to E. coli with repeat MRI 03/14/2021 showing worsening evidence of discitis osteomyelitis and enlargement of epidural abscess status post repeat I&D 9/17 with neurosurgery.  Cultures pending. Retroperitoneal fluid collection: Previously drained by IR with drain removal 02/24/2021, however, no repeat imaging was obtained prior to removal.  Follow-up scan yesterday shows interval enlargement and persistence of fluid collection. Exudative bilateral pleural effusion/empyema: Status post previous chest tube placement in July.  RECOMMENDATIONS:    Continue vancomycin and cefepime for now and follow-up OR cultures and blood cultures. Consult IR to evaluate for drainage of left retroperitoneal fluid collection. Consider discussing with CT surgery regarding patients empyema to see if anything further would be recommended.  Previously seen 01/26/21 and felt that might be amenable to image guided catheter placement, but did not recommend surgical decortication at that time.  Will follow.  Dr Renold Don is back tomorrow.    Active Problems:   Vertebral osteomyelitis (HCC)   Empyema (HCC)   Sepsis due to Escherichia coli (E. coli) (HCC)   Epidural abscess   MEDICATIONS:    Scheduled Meds: . white petrolatum       Continuous Infusions: . ceFEPime (MAXIPIME) IV 2 g (03/15/21 9983)  . vancomycin 1,250 mg (03/15/21 0529)   PRN Meds:.acetaminophen, oxyCODONE  HPI:    Marco Kennedy is a 70 y.o. male with a complicated spinal infection history who has been followed as an outpatient by Dr. Renold Don.  He was admitted yesterday evening  after follow-up MRI showed worsening epidural disease and he was taken to the OR by neurosurgery for further washout.  Patient has a prior extensive thoracolumbar hardware/back surgery 03/2020 complicated by thoracic vertebral osteomyelitis/epidural abscess status post I&D 01/13/2021 with cultures growing E. coli (blood cultures negative at that time).  He was discharged following that procedure but readmitted shortly afterwards with dyspnea and found to have empyema with bilateral chest tube placement (exudative by lights criteria, however, cultures negative).  During that admission a CT scan was also notable for left retroperitoneal fluid collection status post IR drain placement 01/27/2021 and right knee effusion status post aspiration 01/28/2021.  Previous other work-up included negative TTE and negative and admission blood cultures during both of his admits.  His left lower quadrant abdominal drain was in place until 02/24/2021 at which point it was removed but there was no repeat imaging prior to its removal.  He was seen on 03/04/2021 by Dr. Renold Don and continued on cefazolin which was extended through 03/25/2021 pending follow-up MRI of his spine.  This was completed yesterday and he was referred to the emergency department after it revealed worsening findings of discitis/osteomyelitis at T9-10 and T11-11 with enlargement of the epidural abscess at these levels resulting in severe spinal stenosis.  He also complained of worsening back pain and some radiating symptoms down his legs as well as to his flank areas.  He also had a repeat CT of the abdomen and pelvis showing small bilateral pleural effusions similar to the prior study.  There was loculated pleural fluid with imperceptible wall in the right lower hemithorax that was decreased in size (now measuring 2.1 x 3.0  cm) and air was no longer seen within this collection.  Unfortunately, a left lower retroperitoneal fluid collection was again seen measuring 5.5 x 2.3  x 3.4 cm which was increased in size and interval.  There was no air within this collection.  He went to the OR last evening with neurosurgery for thoracic laminectomy to decompress the thecal sac.  Cultures were obtained and currently pending.  Blood cultures from admission are also in process but currently no growth to date.     Past Medical History:  Diagnosis Date  . Arthritis    arthritis-bilateral knees, left Hip.  . Degenerative lumbar spinal stenosis   . EKG abnormality    06-08-16- being evaluated by cardiologist  in [place]  . Idiopathic scoliosis of lumbar region   . Joint stiffness   . Knee pain   . Leg swelling   . Low back pain    Achy, shooting pain.  Hurts to Walk or stand up straight.  . Lumbar radiculopathy   . Scoliosis concern   . Sleep apnea    Bipap use nightly 25/19 -3l/m oxygen  . Spondylolisthesis of lumbar region   . Transfusion history    [place] ,[place] after bleeding post colon polyp removal and colonscopy procedure    Social History   Tobacco Use  . Smoking status: Never  . Smokeless tobacco: Never  Vaping Use  . Vaping Use: Never used  Substance Use Topics  . Alcohol use: Not Currently    Comment: rare- social   . Drug use: No    Family History  Problem Relation Age of Onset  . Hypertension Mother   . Arthritis Mother   . Stomach cancer Father   . Hypertension Brother     No Known Allergies  Review of Systems  Constitutional:  Negative for fever.  HENT: Negative.    Respiratory: Negative.    Cardiovascular: Negative.   Genitourinary: Negative.   Musculoskeletal:  Positive for back pain.  Skin: Negative.   Neurological:  Positive for tingling, sensory change and weakness.  All other systems reviewed and are negative.  OBJECTIVE:   Blood pressure (!) 141/74, pulse 60, temperature 98.1 F (36.7 C), temperature source Oral, resp. rate 18, height 5\' 7"  (1.702 m), weight 106.1 kg, SpO2 96 %. Body mass index is 36.65  kg/m.  Physical Exam Constitutional:      General: He is not in acute distress.    Appearance: Normal appearance.  HENT:     Head: Normocephalic and atraumatic.  Pulmonary:     Effort: Pulmonary effort is normal. No respiratory distress.     Comments: Diminished at the bases Abdominal:     General: There is no distension.     Palpations: Abdomen is soft.     Tenderness: There is no abdominal tenderness. There is no guarding or rebound.  Musculoskeletal:        General: Normal range of motion.     Comments: Right upper extremity PICC line in place with no warmth erythema around insertion site. He is freely able to move his lower extremities.. Surgical thoracic drain in place.  Skin:    General: Skin is warm and dry.     Findings: No rash.  Neurological:     General: No focal deficit present.     Mental Status: He is alert and oriented to person, place, and time.  Psychiatric:        Mood and Affect: Mood normal.  Behavior: Behavior normal.     Lab Results: Lab Results  Component Value Date   WBC 12.8 (H) 03/14/2021   HGB 11.9 (L) 03/14/2021   HCT 38.8 (L) 03/14/2021   MCV 89.4 03/14/2021   PLT 415 (H) 03/14/2021    Lab Results  Component Value Date   NA 138 03/14/2021   K 4.2 03/14/2021   CO2 27 03/14/2021   GLUCOSE 144 (H) 03/14/2021   BUN 30 (H) 03/14/2021   CREATININE 0.76 03/14/2021   CALCIUM 9.5 03/14/2021   GFRNONAA >60 03/14/2021   GFRAA >60 06/16/2016    Lab Results  Component Value Date   ALT 7 03/14/2021   AST 25 03/14/2021   ALKPHOS 79 03/14/2021   BILITOT 0.5 03/14/2021       Component Value Date/Time   CRP 10.5 (H) 02/13/2021 0439       Component Value Date/Time   ESRSEDRATE 70 (H) 01/20/2021 2039    I have reviewed the micro and lab results in Epic.  Imaging: DG Thoracic Spine 2 View  Result Date: 03/14/2021 CLINICAL DATA:  T9-10 laminectomy EXAM: THORACIC SPINE 2 VIEWS COMPARISON:  Thoracic spine MRI 03/14/2021 FINDINGS:  Based on numbering from the recent MRI that shows the most superior level of thoracic spine hardware to be T10, the third provided image shows a probe with its tip in line with the T8-9 disc space. The more proximal portion may be abutting the underside of the T9 lamina, but visualization is obscured by the tissue spreader. IMPRESSION: Probe tip at the T8-9 disc space. Electronically Signed   By: Deatra Robinson M.D.   On: 03/14/2021 21:05   MR THORACIC SPINE WO CONTRAST  Result Date: 03/14/2021 CLINICAL DATA:  Follow-up epidural abscess. EXAM: MRI THORACIC SPINE WITHOUT CONTRAST TECHNIQUE: Multiplanar, multisequence MR imaging of the thoracic spine was performed. No intravenous contrast was administered. COMPARISON:  01/28/2021 FINDINGS: Alignment: Mildly increased kyphosis in the lower thoracic spine. Minimal retrolisthesis of T11 on T12 and T12 on L1. Vertebrae: Posterior spinal fusion instrumentation is again partially visualized beginning at T10 and extending inferiorly into the lumbar spine. There is progressive, extensive marrow edema throughout the T9, T10, and T11 vertebral bodies. This is associated with increased fluid signal in the T10-11 greater than T9-10 disc spaces with associated endplate erosion, most notable at T11 where there is moderate to severe anterior vertebral body height loss. There is a persistent ventral epidural fluid collection which does not extend as far cranially as on the prior MRI (currently T8 and previously T7), however the overall size of the current collection at the T9 and T10 levels is larger than on the prior study and results in increased, moderate spinal stenosis at T9-10 and severe spinal stenosis at T10-11 with moderate cord flattening. Cord:  No definite cord edema. Paraspinal and other soft tissues: Persistent extensive paravertebral inflammation/phlegmon in the lower thoracic spine. Decreased size of loculated bilateral pleural collections. Disc levels:  Postoperative an infectious findings in the lower thoracic spine as detailed above. Minor spondylosis and moderate facet arthrosis more superiorly in the thoracic spine without associated spinal stenosis. IMPRESSION: 1. Worsening findings of discitis-osteomyelitis at T9-10 and T10-11 with enlargement of the epidural abscess at these levels resulting in severe spinal stenosis at T10-11. No cord edema. 2. Persistent extensive paravertebral inflammation/phlegmon. Decreased size of loculated bilateral pleural collections. These results will be called to the ordering clinician or representative by the Radiologist Assistant, and communication documented in the PACS or [employer].  Electronically Signed   By: Sebastian Ache M.D.   On: 03/14/2021 12:15   CT ABDOMEN PELVIS W CONTRAST  Result Date: 03/14/2021 CLINICAL DATA:  Follow-up for epidural abscess. EXAM: CT ABDOMEN AND PELVIS WITH CONTRAST TECHNIQUE: Multidetector CT imaging of the abdomen and pelvis was performed using the standard protocol following bolus administration of intravenous contrast. CONTRAST:  OMNIPAQUE IOHEXOL 350 MG/ML SOLN COMPARISON:  MRI thoracic spine 03/14/2021. CT abdomen and pelvis 02/16/2021. FINDINGS: Lower chest: Again seen are small bilateral pleural effusions similar to the prior study. Loculated pleural fluid with imperceptible wall in the right lower hemithorax has decreased in size now measuring 2.1 x 3.0 cm. Air is no longer seen within this collection. Hepatobiliary: No focal liver abnormality is seen. No gallstones, gallbladder wall thickening, or biliary dilatation. Pancreas: Unremarkable. No pancreatic ductal dilatation or surrounding inflammatory changes. Spleen: Normal in size without focal abnormality. Adrenals/Urinary Tract: The bilateral kidneys are within normal limits. There is no hydronephrosis. Bladder is decompressed and not well evaluated secondary to streak artifact in the pelvis, but grossly within normal  limits. The adrenal glands are within normal limits. Stomach/Bowel: Stomach is within normal limits. Appendix appears normal. No evidence of bowel wall thickening, distention, or inflammatory changes. There is a small hiatal hernia, unchanged. Vascular/Lymphatic: No significant vascular findings are present. No enlarged abdominal or pelvic lymph nodes. Reproductive: Prostate is unremarkable. Other: There is no ascites. There is a small fat containing umbilical hernia. Pelvic percutaneous drainage catheter has been removed in the interval. Lower left retroperitoneal fluid collection is again seen measuring 5.5 x 2.3 by 3.4 cm. This has slightly increased in size in the interval. There is no air within this collection. Musculoskeletal: There is subcutaneous posterior lumbar scarring without discrete fluid collection identified. T10-S1 posterior fusion hardware is again noted. Endplate changes at T9-T10 and T10-T11 correspond to patient's known osteomyelitis/discitis seen on recent MRI. There is lucency surrounding the pedicle screws at T10 similar to the prior study. There is perivertebral soft tissue thickening at these infected levels without discrete new drainable fluid collection. Left hip arthroplasty is present. IMPRESSION: 1. 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 There is stable lucency surrounding the T10 pedicle screws likely secondary to infection. There is paravertebral soft tissue thickening without discrete fluid collection. 2. Left pelvic fluid collection has increased in size. The drain has been removed in the interval. Correlate clinically for abscess. 3. Loculated pleural fluid collection in the right lower hemithorax has slightly decreased in size, but has not resolved. Findings are likely related to patient's known empyema. Electronically Signed   By: Darliss Cheney M.D.   On: 03/14/2021 17:34     Imaging independently reviewed in Epic.  Vedia Coffer for Infectious Disease [facility] [phone] pager 03/15/2021, 11:17 AM  I spent greater than 110 minutes with the patient including greater than 50% of time in face to face counsel of the patient and in coordination of their care.

## 2021-03-15 NOTE — Progress Notes (Signed)
  NEUROSURGERY PROGRESS NOTE   No issues overnight. Some back pain, improved episodes of sharp leg pain, does describe some leg paresthesias.  EXAM:  BP (!) 141/74 (BP Location: Left Arm)   Pulse 60   Temp 98.1 F (36.7 C) (Oral)   Resp 18   Ht 5\' 7"  (1.702 m)   Wt 106.1 kg   SpO2 96%   BMI 36.65 kg/m   Awake, alert, oriented  Speech fluent, appropriate  CN grossly intact  5/5 BUE/BLE  Hemovac in place, 75cc  IMPRESSION:  70 y.o. male POD#1 s/p T9-10 laminectomy for SEA. At baseline neurologically Retroperitoneal fluid collection enlarged again  PLAN: - Abx broadened to vanc/cefepime per ID - IR to eval possible perc drain for retroperitoneal collection - Mobilize today with PT/OT   66, MD [facility]

## 2021-03-15 NOTE — Progress Notes (Signed)
Request to IR for left RP fluid collection aspiration/drain placement - patient with previous IR drain in this area which was removed on 8/30 due to resolution of collection on 8/22 CT and scant output at time of removal.   Patient history and imaging reviewed by IR attending Dr. Lowella Dandy today who notes the collection to be minimally increased in size from 8/22 CT abd/pelvis w/contrast - as such he does not recommend percutaneous aspiration/drain placement however he requests that this be reviewed Monday AM by the incoming IR attending for a second opinion.  Patient to be NPO at midnight, hold anticoagulation, IR attending will review imaging in the AM and make determination regarding percutaneous aspiration/drain placement.   Above relayed to Dr. Montez Morita via Epic secure chat today.  Lynnette Caffey, PA-C

## 2021-03-15 NOTE — Progress Notes (Signed)
Pt refused BiPAP tonight. 

## 2021-03-15 NOTE — Progress Notes (Signed)
RT note. Patient cpap all set up for the night. Patient on auto cpap 20/5. Patient sat 97% on Rm air, not needing 02 at this time. RT will continue to monitor, Patient educated on how to take cpap on/off. Told patient to let RN know if he needs help with placing cpap on.

## 2021-03-16 ENCOUNTER — Encounter (HOSPITAL_COMMUNITY): Payer: Self-pay | Admitting: Neurosurgery

## 2021-03-16 ENCOUNTER — Inpatient Hospital Stay (HOSPITAL_COMMUNITY): Payer: BC Managed Care – PPO

## 2021-03-16 DIAGNOSIS — M4644 Discitis, unspecified, thoracic region: Secondary | ICD-10-CM | POA: Diagnosis not present

## 2021-03-16 DIAGNOSIS — R188 Other ascites: Secondary | ICD-10-CM

## 2021-03-16 DIAGNOSIS — G062 Extradural and subdural abscess, unspecified: Secondary | ICD-10-CM | POA: Diagnosis not present

## 2021-03-16 LAB — BASIC METABOLIC PANEL
Anion gap: 9 (ref 5–15)
BUN: 18 mg/dL (ref 8–23)
CO2: 26 mmol/L (ref 22–32)
Calcium: 8.6 mg/dL — ABNORMAL LOW (ref 8.9–10.3)
Chloride: 97 mmol/L — ABNORMAL LOW (ref 98–111)
Creatinine, Ser: 0.64 mg/dL (ref 0.61–1.24)
GFR, Estimated: >60 mL/min (ref >60)
Glucose, Bld: 144 mg/dL — ABNORMAL HIGH (ref 70–99)
Potassium: 3.9 mmol/L (ref 3.5–5.1)
Sodium: 132 mmol/L — ABNORMAL LOW (ref 135–145)

## 2021-03-16 LAB — PROTIME-INR
INR: 1.2 (ref 0.8–1.2)
Prothrombin Time: 15.5 seconds — ABNORMAL HIGH (ref 11.4–15.2)

## 2021-03-16 IMAGING — CT CT IMAGE GUIDED DRAINAGE BY PERCUTANEOUS CATHETER
1 of 2 series · 14 of 32 positions shown, 18 images · non-contrast
Comparison: None.

INDICATION: 70-year-old male with history of recurrent left retroperitoneal
fluid collection and increasing leukocytosis.

EXAM:
CT IMAGE GUIDED DRAINAGE BY PERCUTANEOUS CATHETER

[Series 2: i-spiral 5.0 b40f · axial · 0.90mm/px · z∈[+871,+1172]mm · 14 of 98 slices shown, 18 images]
[im 8/98  soft-tissue]
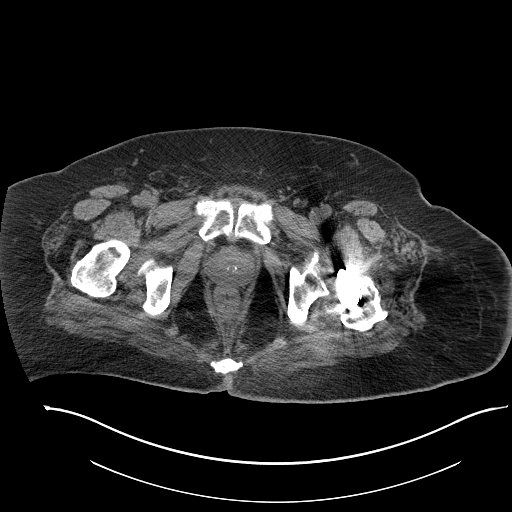
[im 8/98  bone]
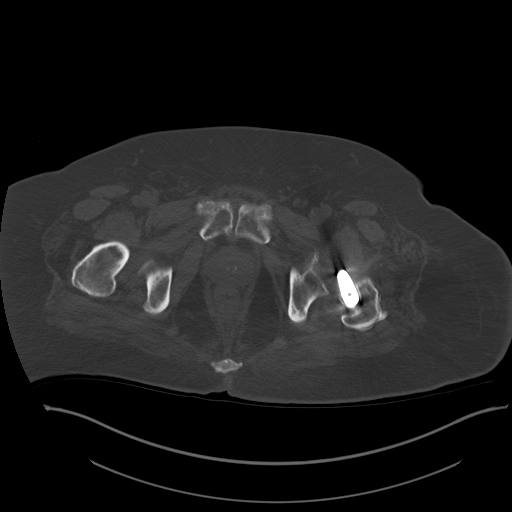
[im 16/98  soft-tissue]
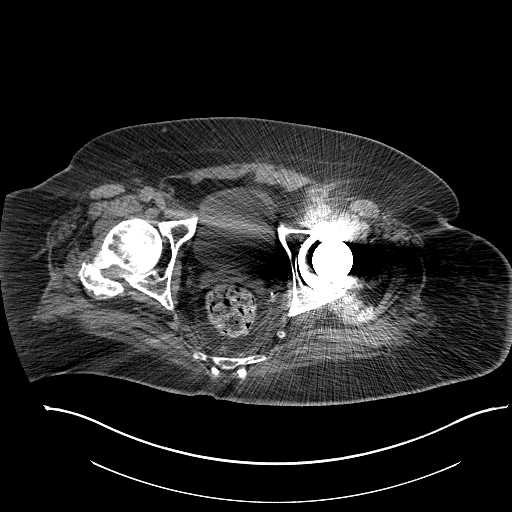
[im 24/98  soft-tissue]
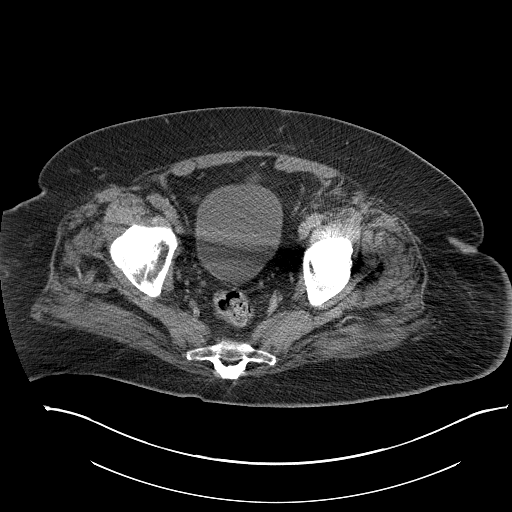
[im 32/98  soft-tissue]
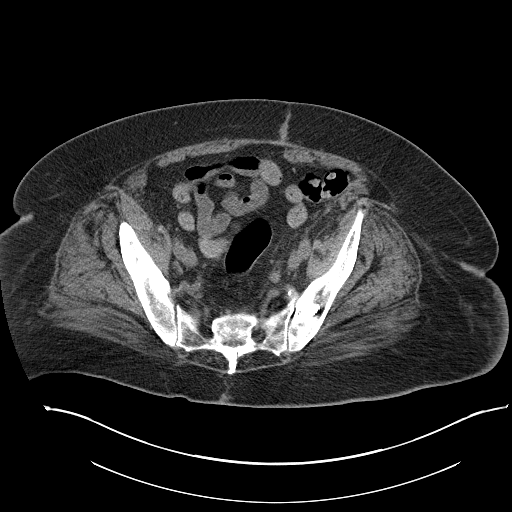
[im 39/98  soft-tissue]
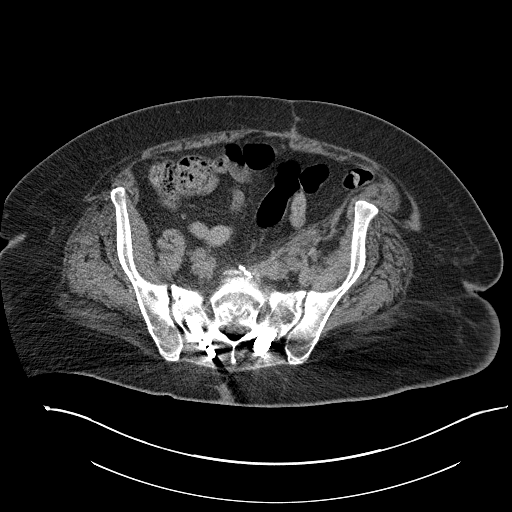
[im 47/98  soft-tissue]
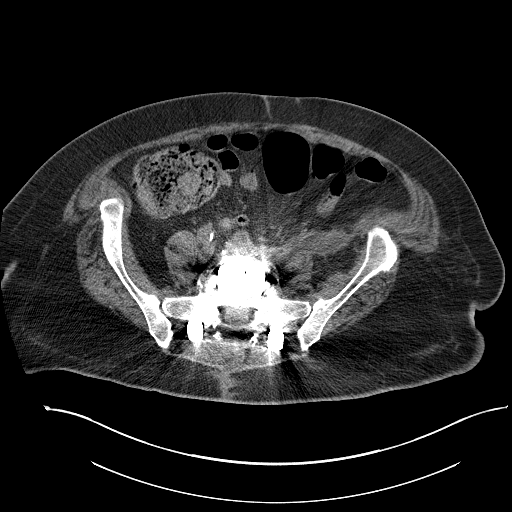
[im 55/98  soft-tissue]
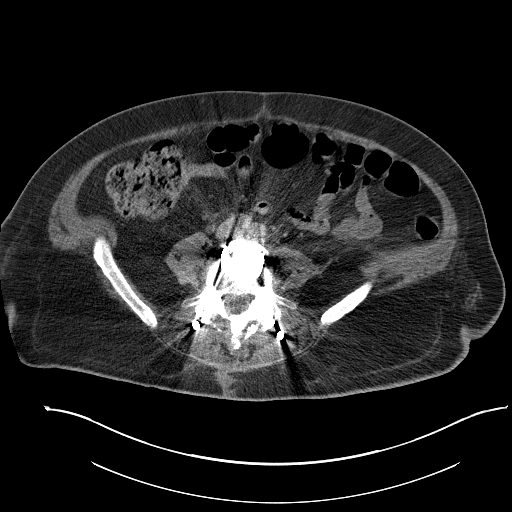
[im 63/98  soft-tissue]
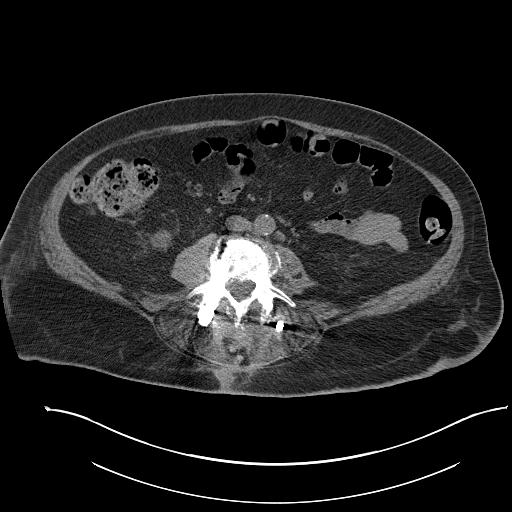
[im 70/98  soft-tissue]
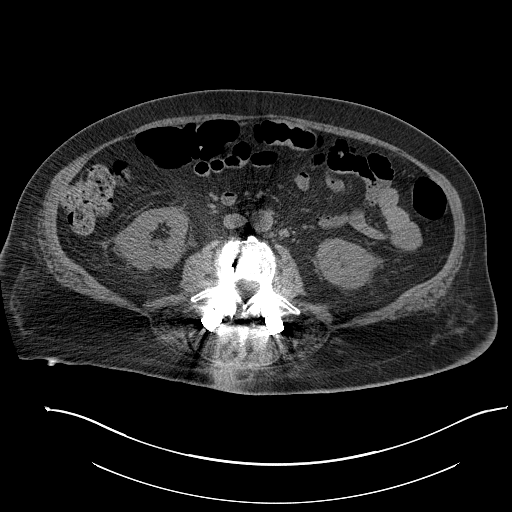
[im 70/98  bone]
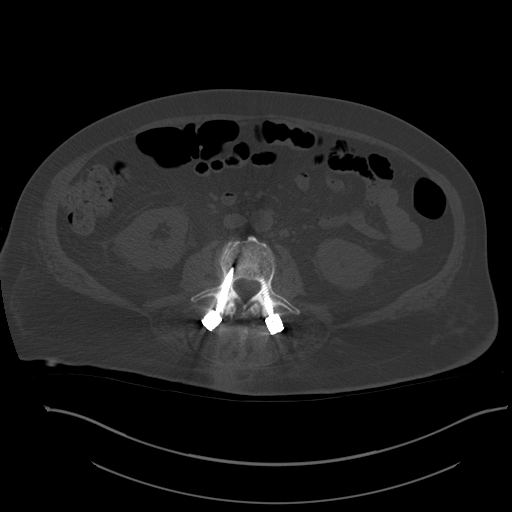
[im 78/98  soft-tissue]
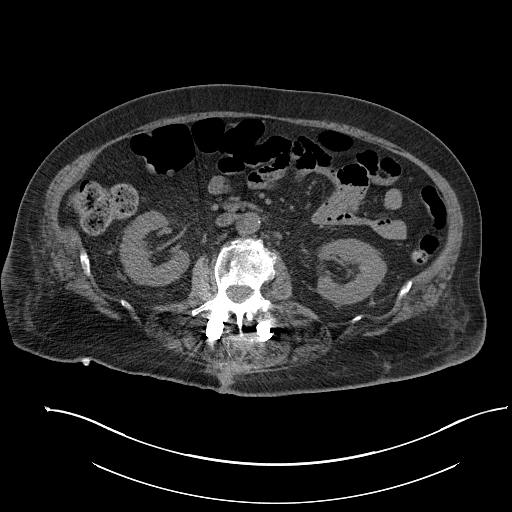
[im 82/98  lung]
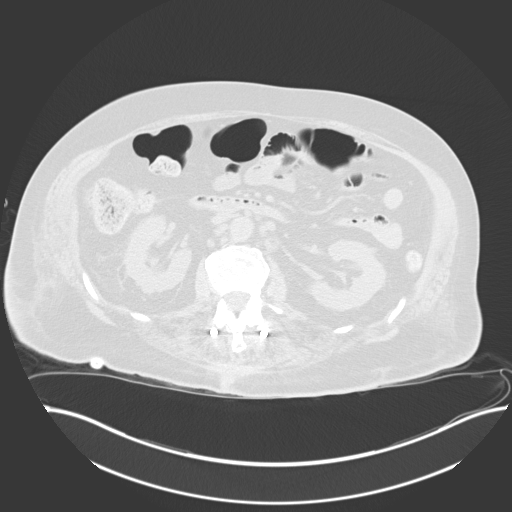
[im 86/98  soft-tissue]
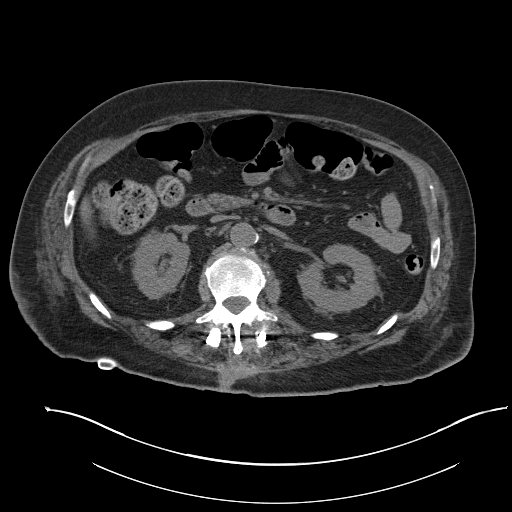
[im 86/98  lung]
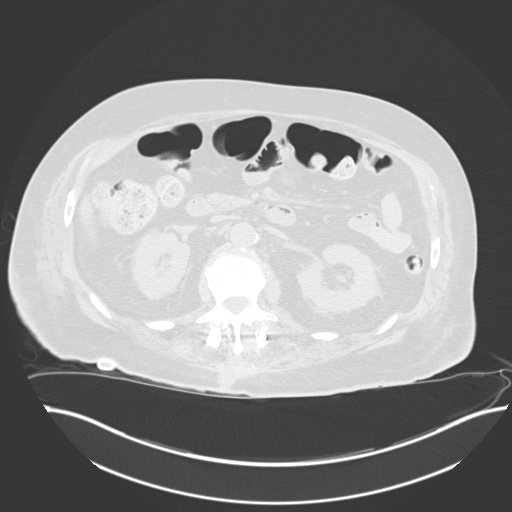
[im 90/98  lung]
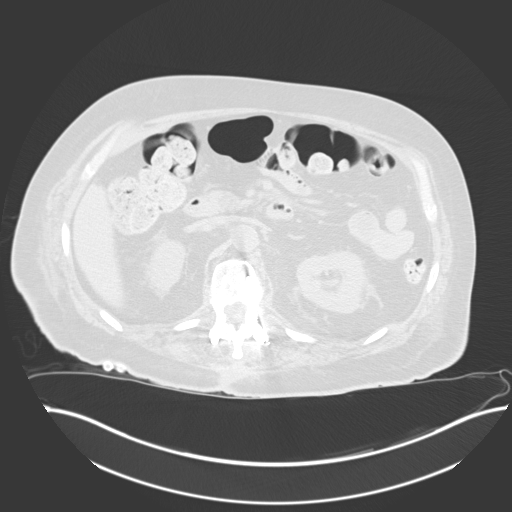
[im 94/98  soft-tissue]
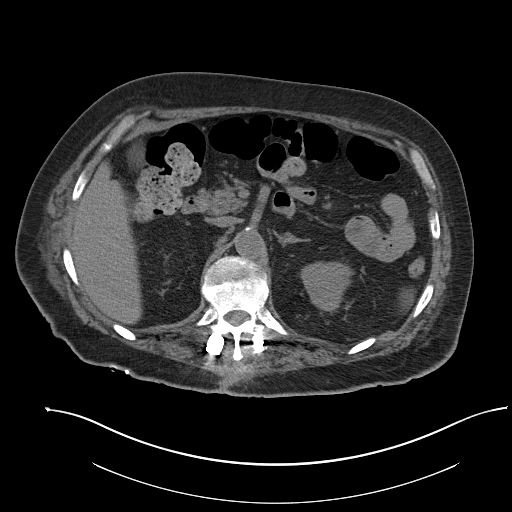
[im 94/98  lung]
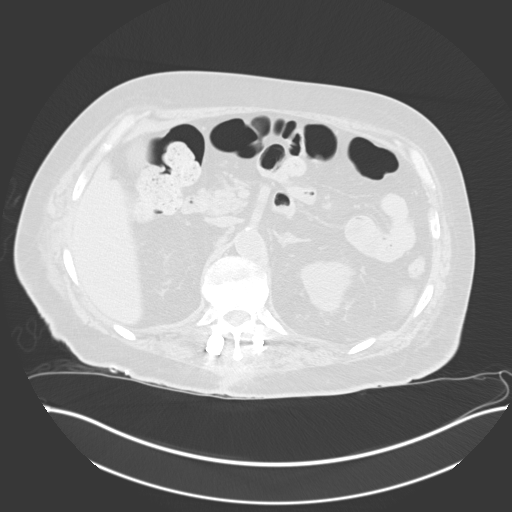

[14 of 32 positions shown; findings below may reference images not displayed]

MEDICATIONS:
The patient is currently admitted to the hospital and receiving
intravenous antibiotics. The antibiotics were administered within an
appropriate time frame prior to the initiation of the procedure.

ANESTHESIA/SEDATION:
Moderate (conscious) sedation was employed during this procedure. A
total of Versed 2 mg and Fentanyl 100 mcg was administered
intravenously.

Moderate Sedation Time: 14 minutes. The patient's level of
consciousness and vital signs were monitored continuously by
radiology nursing throughout the procedure under my direct
supervision.

CONTRAST:  None

COMPLICATIONS:
None immediate.

PROCEDURE:
Informed written consent was obtained from the patient after a
discussion of the risks, benefits and alternatives to treatment. The
patient was placed supine on the CT gantry and a pre procedural CT
was performed re-demonstrating the known abscess/fluid collection
within the left retroperitoneum. The procedure was planned. A
timeout was performed prior to the initiation of the procedure.

The left lower quadrant was prepped and draped in the usual sterile
fashion. The overlying soft tissues were anesthetized with 1%
lidocaine with epinephrine. Appropriate trajectory was planned with
the use of a 22 gauge spinal needle. An 18 gauge trocar needle was
advanced into the abscess/fluid collection and a short Amplatz super
stiff wire was coiled within the collection. Appropriate positioning
was confirmed with a limited CT scan. The tract was serially dilated
allowing placement of a 10.2 French all-purpose drainage catheter.
Appropriate positioning was confirmed with a limited postprocedural
CT scan.

Approximately 20 ml of serous fluid was aspirated. The tube was
connected to a bulb suction and sutured in place. A dressing was
placed. The patient tolerated the procedure well without immediate
post procedural complication.
IMPRESSION: Successful CT guided placement of a 10.2 French all purpose drain
catheter into the left retroperitoneal fluid collection with
aspiration of approximately 20 mL of serous fluid. Samples were sent
to the laboratory as requested by the ordering clinical team.

## 2021-03-16 MED ORDER — HYDROMORPHONE HCL 1 MG/ML IJ SOLN
1.0000 mg | INTRAMUSCULAR | Status: DC | PRN
  Administered 2021-03-16 – 2021-03-17 (×4): 1 mg via INTRAVENOUS
  Filled 2021-03-16 (×4): qty 1

## 2021-03-16 MED ORDER — SODIUM CHLORIDE 0.9% FLUSH
5.0000 mL | Freq: Three times a day (TID) | INTRAVENOUS | Status: DC
  Administered 2021-03-16 – 2021-04-02 (×44): 5 mL

## 2021-03-16 MED ORDER — MIDAZOLAM HCL 2 MG/2ML IJ SOLN
INTRAMUSCULAR | Status: AC
  Filled 2021-03-16: qty 2

## 2021-03-16 MED ORDER — FENTANYL CITRATE (PF) 100 MCG/2ML IJ SOLN
INTRAMUSCULAR | Status: AC
  Filled 2021-03-16: qty 2

## 2021-03-16 MED ORDER — SENNOSIDES-DOCUSATE SODIUM 8.6-50 MG PO TABS
1.0000 | ORAL_TABLET | Freq: Every day | ORAL | Status: DC
  Administered 2021-03-16 – 2021-03-19 (×4): 1 via ORAL
  Filled 2021-03-16 (×4): qty 1

## 2021-03-16 MED ORDER — POLYETHYLENE GLYCOL 3350 17 G PO PACK
17.0000 g | PACK | Freq: Every day | ORAL | Status: DC
  Administered 2021-03-16 – 2021-04-03 (×17): 17 g via ORAL
  Filled 2021-03-16 (×19): qty 1

## 2021-03-16 MED ORDER — FENTANYL CITRATE (PF) 100 MCG/2ML IJ SOLN
INTRAMUSCULAR | Status: DC | PRN
  Administered 2021-03-16 (×4): 25 ug via INTRAVENOUS

## 2021-03-16 MED ORDER — ERTAPENEM SODIUM 1 G IJ SOLR
1.0000 g | INTRAMUSCULAR | Status: DC
  Filled 2021-03-16: qty 1

## 2021-03-16 MED ORDER — LIDOCAINE HCL 1 % IJ SOLN
INTRAMUSCULAR | Status: AC
  Filled 2021-03-16: qty 10

## 2021-03-16 MED ORDER — MIDAZOLAM HCL 2 MG/2ML IJ SOLN
INTRAMUSCULAR | Status: DC | PRN
  Administered 2021-03-16 (×2): 1 mg via INTRAVENOUS

## 2021-03-16 MED ORDER — SODIUM CHLORIDE 0.9 % IV SOLN
1.0000 g | INTRAVENOUS | Status: DC
  Administered 2021-03-16 – 2021-03-18 (×3): 1000 mg via INTRAVENOUS
  Filled 2021-03-16 (×4): qty 1

## 2021-03-16 NOTE — Progress Notes (Addendum)
HD#1 SUBJECTIVE:  Patient Summary: Marco Kennedy is a 70 y.o. with a pertinent PMH of chronic low back pain and hypertension, who presented for MIR follow-up and had worsening lower back pain and admitted for worsening epidural abscess.   Overnight Events: Patient experienced extreme pain overnight, medication management was adjusted for adequate pain control.  Interim History: Patient endorses pain that is much better managed at this time and it was overnight.  He says that he has noticed his thighs feel like jelly which is new over the last few days.  He denies loss of bowel or bladder.  He does endorse continued numbness to bilateral lower extremities.  OBJECTIVE:  Vital Signs: Vitals:   03/16/21 0021 03/16/21 0351 03/16/21 0837 03/16/21 1234  BP: (!) 145/79 (!) 155/81 (!) 153/86 (!) 162/91  Pulse: 77 82 81 82  Resp: 15 16 17 18   Temp: 98.3 F (36.8 C) 98.1 F (36.7 C) 98.8 F (37.1 C)   TempSrc: Oral Oral Oral   SpO2: 95% 96% 96%   Weight:      Height:       Supplemental O2: Room Air SpO2: 96 % O2 Flow Rate (L/min): 2 L/min  Filed Weights   03/15/21 0014  Weight: 106.1 kg     Intake/Output Summary (Last 24 hours) at 03/16/2021 1257 Last data filed at 03/16/2021 0839 Gross per 24 hour  Intake 590 ml  Output 1155 ml  Net -565 ml   Net IO Since Admission: -65 mL [03/16/21 1257]  Physical Exam: Constitutional: Marco Kennedy sitting in bed.  No acute distress. Cardio: Regular rate and rhythm.  No murmurs, rubs, gallops. Pulm: Normal work of breathing.  Crackles noted bilaterally at bases, appreciated more on right side. Abdomen: Soft, nontender. Neuro: Mental Status: Patient is awake, alert, oriented x3 No signs of aphasia or neglect Motor: Equal and normal effort thorughout, 5/5 bilateral flexion and extension around the knee joint, however patient minimally able to dorsiflex bilateral feet.  Weakness is noted with hip flexion, 3/5 strength Sensory:  Sensation is grossly intact though decreased bilateral UEs & LEs Deep Tendon Reflexes: Clinically difficult to assess. Psych: Normal mood and affect   Patient Lines/Drains/Airways Status     Active Line/Drains/Airways     Name Placement date Placement time Site Days   Peripheral IV 03/14/21 20 G 1.88" Anterior;Left;Proximal Forearm 03/14/21  1549  Forearm  2   PICC Single Lumen 01/15/21 Right Basilic 39 cm 0 cm 01/15/21  01/17/21  Basilic  60   Closed System Drain 1 Left Flank Bulb (JP) 12 Fr. 01/27/21  1407  Flank  48   Closed System Drain Superior;Midline Back Accordion (Hemovac) 10 Fr. 03/14/21  1933  Back  2   Incision (Closed) 04/18/20 Flank Left 04/18/20  1419  -- 332   Incision (Closed) 01/13/21 Back 01/13/21  1016  -- 62   Incision (Closed) 03/14/21 Back 03/14/21  2006  -- 2   Wound / Incision (Open or Dehisced) 01/29/21 Non-pressure wound;Other (Comment) Buttocks Left 01/29/21  0900  Buttocks  46             ASSESSMENT/PLAN:  Assessment: Active Problems:   Vertebral osteomyelitis (HCC)   Empyema (HCC)   Sepsis due to Escherichia coli (E. coli) (HCC)   Epidural abscess   Plan: #Epidural abscess Patient underwent T9-T10 laminectomy for decompression of thecal sac and debridement of abscess on 9/17.  Of note he had a left retroperitoneal abscess drain placed on 01/28/2021  which was removed 02/24/21 after the abscess resolved.  Cultures from this operation show no growth to date, no white blood cells.  Blood culture also showing no growth to date.  Patient endorses pain that is taken care of at this time, however states that it was very bad overnight and is made worse with movement.  He also endorses a jelly-like feeling in his thighs that is new over the last few days. -Neurosurgery on board, appreciate their recommendations  -Patient will need additional surgery to the thoracic spine once infection is cleared  -TLSO brace -Infectious disease on board, appreciate their  recommendations  -Has been treated with vancomycin, cefepime  -We will initiate ertapenem 1 g IM daily -IR on board, appreciate their assistance  -Will need left retroperitoneal fluid collection aspirated, will consider placing drain at that time. -CT surgery consulted for possible intervention of loculated pleural effusion.  -They feel that in order to get full view of this that we would need to get CT chest.  He is not having respiratory symptoms at this time.  From what they can see, it is not felt that intervention would be necessary.  If the patient starts to develop respiratory symptoms we will reconsult.  #Hypertension Chronically managed, on amlodipine at home.  This is currently being held.   Best Practice: Diet: N.p.o. IVF: Fluids: None VTE: SCDs Start: 03/14/21 1708 Code: Full AB: Ertapenem 1 g IM daily Therapy Recs: CIR DISPO: Continue to monitor inpatient.  Signature: Champ Mungo, D.O.  Internal Medicine Resident, PGY-1 [facility]  Pager: [phone] 12:57 PM, 03/16/2021   Please contact the on call pager after 5 pm and on weekends at [phone].

## 2021-03-16 NOTE — Procedures (Signed)
Interventional Radiology Procedure Note  Procedure: CT guided left retroperitoneal drain placement  Findings: Please refer to procedural dictation for full description.10.2 Fr drainage catheter placed in left retroperitoneal fluid collection.  Approximately 20 mL serous aspirate.  Sample sent for culture. Drain placed to bulb suction.  Complications: None immediate  Estimated Blood Loss: < 5 mL  Recommendations: Keep to bulb suction for now. Follow up cultures. IR will follow.   Marliss Coots, MD Pager: [phone]

## 2021-03-16 NOTE — Progress Notes (Signed)
Subjective: Patient reports that he had an acute pain exacerbation overnight which required IV Dilaudid. This morning, he reports his pain has improved but continues to be painful, especially with position changes. Pain is reported to be in his low back and radiate down into the anterior aspect of his BLE.   Objective: Vital signs in last 24 hours: Temp:  [98.1 F (36.7 C)-98.7 F (37.1 C)] 98.1 F (36.7 C) (09/19 0351) Pulse Rate:  [77-110] 82 (09/19 0351) Resp:  [15-20] 16 (09/19 0351) BP: (120-155)/(65-81) 155/81 (09/19 0351) SpO2:  [95 %-96 %] 96 % (09/19 0351)  Intake/Output from previous day: 09/18 0701 - 09/19 0700 In: 1190 [P.O.:740; IV Piggyback:450] Out: 1430 [Urine:1325; Drains:105] Intake/Output this shift: No intake/output data recorded.  Physical Exam: Patient is awake, A/O X 4, conversant, and in good spirits. They are in NAD and VSS. Doing well. Speech is fluent and appropriate. MAEW with good strength that is symmetric bilaterally. 5/5 BUE/BLE. PERLA, EOMI. CNs grossly intact. Dressing is saturated with new and old drainage. Incision is well approximated. Hemovac with 105 cc of output overnight.   Lab Results: Recent Labs    03/14/21 1347 03/15/21 1127  WBC 12.8* 14.3*  HGB 11.9* 9.5*  HCT 38.8* 30.4*  PLT 415* 406*   BMET Recent Labs    03/15/21 1127 03/16/21 0459  NA 137 132*  K 4.3 3.9  CL 98 97*  CO2 26 26  GLUCOSE 146* 144*  BUN 20 18  CREATININE 0.70 0.64  CALCIUM 9.0 8.6*    Studies/Results: DG Thoracic Spine 2 View  Result Date: 03/14/2021 CLINICAL DATA:  T9-10 laminectomy EXAM: THORACIC SPINE 2 VIEWS COMPARISON:  Thoracic spine MRI 03/14/2021 FINDINGS: Based on numbering from the recent MRI that shows the most superior level of thoracic spine hardware to be T10, the third provided image shows a probe with its tip in line with the T8-9 disc space. The more proximal portion may be abutting the underside of the T9 lamina, but visualization is  obscured by the tissue spreader. IMPRESSION: Probe tip at the T8-9 disc space. Electronically Signed   By: Deatra Robinson M.D.   On: 03/14/2021 21:05   MR THORACIC SPINE WO CONTRAST  Result Date: 03/14/2021 CLINICAL DATA:  Follow-up epidural abscess. EXAM: MRI THORACIC SPINE WITHOUT CONTRAST TECHNIQUE: Multiplanar, multisequence MR imaging of the thoracic spine was performed. No intravenous contrast was administered. COMPARISON:  01/28/2021 FINDINGS: Alignment: Mildly increased kyphosis in the lower thoracic spine. Minimal retrolisthesis of T11 on T12 and T12 on L1. Vertebrae: Posterior spinal fusion instrumentation is again partially visualized beginning at T10 and extending inferiorly into the lumbar spine. There is progressive, extensive marrow edema throughout the T9, T10, and T11 vertebral bodies. This is associated with increased fluid signal in the T10-11 greater than T9-10 disc spaces with associated endplate erosion, most notable at T11 where there is moderate to severe anterior vertebral body height loss. There is a persistent ventral epidural fluid collection which does not extend as far cranially as on the prior MRI (currently T8 and previously T7), however the overall size of the current collection at the T9 and T10 levels is larger than on the prior study and results in increased, moderate spinal stenosis at T9-10 and severe spinal stenosis at T10-11 with moderate cord flattening. Cord:  No definite cord edema. Paraspinal and other soft tissues: Persistent extensive paravertebral inflammation/phlegmon in the lower thoracic spine. Decreased size of loculated bilateral pleural collections. Disc levels: Postoperative an infectious findings  in the lower thoracic spine as detailed above. Minor spondylosis and moderate facet arthrosis more superiorly in the thoracic spine without associated spinal stenosis. IMPRESSION: 1. Worsening findings of discitis-osteomyelitis at T9-10 and T10-11 with enlargement of  the epidural abscess at these levels resulting in severe spinal stenosis at T10-11. No cord edema. 2. Persistent extensive paravertebral inflammation/phlegmon. Decreased size of loculated bilateral pleural collections. These results will be called to the ordering clinician or representative by the Radiologist Assistant, and communication documented in the PACS or [employer]. Electronically Signed   By: Sebastian Ache M.D.   On: 03/14/2021 12:15   CT ABDOMEN PELVIS W CONTRAST  Result Date: 03/14/2021 CLINICAL DATA:  Follow-up for epidural abscess. EXAM: CT ABDOMEN AND PELVIS WITH CONTRAST TECHNIQUE: Multidetector CT imaging of the abdomen and pelvis was performed using the standard protocol following bolus administration of intravenous contrast. CONTRAST:  OMNIPAQUE IOHEXOL 350 MG/ML SOLN COMPARISON:  MRI thoracic spine 03/14/2021. CT abdomen and pelvis 02/16/2021. FINDINGS: Lower chest: Again seen are small bilateral pleural effusions similar to the prior study. Loculated pleural fluid with imperceptible wall in the right lower hemithorax has decreased in size now measuring 2.1 x 3.0 cm. Air is no longer seen within this collection. Hepatobiliary: No focal liver abnormality is seen. No gallstones, gallbladder wall thickening, or biliary dilatation. Pancreas: Unremarkable. No pancreatic ductal dilatation or surrounding inflammatory changes. Spleen: Normal in size without focal abnormality. Adrenals/Urinary Tract: The bilateral kidneys are within normal limits. There is no hydronephrosis. Bladder is decompressed and not well evaluated secondary to streak artifact in the pelvis, but grossly within normal limits. The adrenal glands are within normal limits. Stomach/Bowel: Stomach is within normal limits. Appendix appears normal. No evidence of bowel wall thickening, distention, or inflammatory changes. There is a small hiatal hernia, unchanged. Vascular/Lymphatic: No significant vascular findings are  present. No enlarged abdominal or pelvic lymph nodes. Reproductive: Prostate is unremarkable. Other: There is no ascites. There is a small fat containing umbilical hernia. Pelvic percutaneous drainage catheter has been removed in the interval. Lower left retroperitoneal fluid collection is again seen measuring 5.5 x 2.3 by 3.4 cm. This has slightly increased in size in the interval. There is no air within this collection. Musculoskeletal: There is subcutaneous posterior lumbar scarring without discrete fluid collection identified. T10-S1 posterior fusion hardware is again noted. Endplate changes at T9-T10 and T10-T11 correspond to patient's known osteomyelitis/discitis seen on recent MRI. There is lucency surrounding the pedicle screws at T10 similar to the prior study. There is perivertebral soft tissue thickening at these infected levels without discrete new drainable fluid collection. Left hip arthroplasty is present. IMPRESSION: 1. 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 There is stable lucency surrounding the T10 pedicle screws likely secondary to infection. There is paravertebral soft tissue thickening without discrete fluid collection. 2. Left pelvic fluid collection has increased in size. The drain has been removed in the interval. Correlate clinically for abscess. 3. Loculated pleural fluid collection in the right lower hemithorax has slightly decreased in size, but has not resolved. Findings are likely related to patient's known empyema. Electronically Signed   By: Darliss Cheney M.D.   On: 03/14/2021 17:34    Assessment/Plan: 70 y.o. male who is POD#2 s/p T9-10 laminectomy for SEA. The patient remains at his baseline neurological status. He is having intermittent exacerbations of low back pain that radiates into the anterior aspects of his BLE. Mobility is decreased secondary to pain. 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 As of now, no new neurosurgical recommendations. Continue supportive care.   PLAN: - Abx broadened to vanc/cefepime per ID - IR to eval possible perc drain for retroperitoneal collection - Continue with PT/OT - Continue working on pain control - Encourage mobilization - Plan for IR to perform left RP fluid collection aspiration with possible drain placement either today or tomorrow.  - TLSO brace - Will need additional surgery in his thoracic spine once infection has cleared.       LOS: 1 day    Council Mechanic, DNP, NP-C 03/16/2021, 8:37 AM

## 2021-03-16 NOTE — Progress Notes (Signed)
Over night, patient required additional pain intervention. Positioning, po PRN meds, and ice packs were ineffective in reducing pain. Patient described pain as shooting down both legs and his legs felt numbness more than at beginning of shift. RN notified on call MD for [facility] and got new verbal order for dilaudid 1 mg IV, PRN Q 4 hours. Patient voided using urinal, opting to stay in bed due to his extreme low back/leg pain. He did not ambulate over night.

## 2021-03-16 NOTE — Progress Notes (Signed)
Inpatient Rehab Admissions Coordinator Note:   Per PT patient was screened for CIR candidacy by Zyren Sevigny Luvenia Starch, CCC-SLP. At this time, pt is pain limited. Pt may have potential to progress to becoming a potential CIR candidate. CIR admissions team will follow and monitor progress and participation with therapies.  If pt appears to be an appropriate candidate, a consult order will be placed at that time.    Wolfgang Phoenix, MS, CCC-SLP Admissions Coordinator [phone] 03/16/21 1:16 PM

## 2021-03-16 NOTE — Progress Notes (Signed)
Pt sated he doesn't want to wear CPAP for the night.

## 2021-03-16 NOTE — Evaluation (Signed)
Occupational Therapy Evaluation Patient Details Name: Marco Kennedy MRN: [id number] DOB: 1950-07-08 Today's Date: 03/16/2021   History of Present Illness Pt is a 70 y.o. 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 H eis now s/p T9-T10 laminectomy for decompression of thecal sac and debridement of abscess 9/18. Pt had back sx  01/2020 with Dr. Venetia Maxon; admission 7/16-22 for sepsis and again 7/26 fof SOB and fever. PMHx: back sx, arthritis, degenerative lumbar spinal stenosis, THA 2017.   Clinical Impression   Marco Kennedy was evaluated s/p the above admission list; prior to his initial back sx he was mod I with all mobiliyt/ADS. However prior to this admission and admissions in July, he required assist for ADLs from his wife, and ambulated with a rollator or SPC. He was active with home health PT to increase ambulation distances and decrease fall risk. He lives in a 2 level home with ~15 STE; he works as an Airline pilot. Upon evaluation pt was limited to bed level rolling as he felt a "popping" sensation that caused significant increased pain; he described the pain as similar to what he felt Saturday while getting an MRI. He declined further mobility. At this time he is requiring significant assist levels for all Adls and mobility. Pt would benefit from continued OT acutely to address the deficits and limitations below. Recommend SNF at this time; pt may progress to [facility] depending on his progress acutely and pain management.      Recommendations for follow up therapy are one component of a multi-disciplinary discharge planning process, led by the attending physician.  Recommendations may be updated based on patient status, additional functional criteria and insurance authorization.   Follow Up Recommendations  SNF;Supervision/Assistance - 24 hour (Recommending SNF at this time due to heavy assist levels; pending pt progress and pain level pt may  be able to d/c home with [facility])    Equipment Recommendations  None recommended by OT       Precautions / Restrictions Precautions Precautions: Fall;Back Precaution Booklet Issued: No Restrictions Weight Bearing Restrictions: No      Mobility Bed Mobility Overal bed mobility: Needs Assistance Bed Mobility: Rolling Rolling: Min assist         General bed mobility comments: session limited to rolling; pt declined further attempt due to exacerbation of pain with "popping" senseation, similar to what he felt during his MRI on saturday    Transfers Overall transfer level: Needs assistance               General transfer comment: defer due to pain    Balance Overall balance assessment: Needs assistance                                         ADL either performed or assessed with clinical judgement   ADL Overall ADL's : Needs assistance/impaired Eating/Feeding: NPO   Grooming: Set up;Bed level   Upper Body Bathing: Minimal assistance;Bed level   Lower Body Bathing: Maximal assistance;Bed level   Upper Body Dressing : Minimal assistance;Bed level   Lower Body Dressing: Maximal assistance;Bed level   Toilet Transfer: Maximal assistance Toilet Transfer Details (indicate cue type and reason): bed level; bed pan placement at this time due to pain with OOB transfers Toileting- Clothing Manipulation and Hygiene: Maximal assistance;Bed level       Functional mobility during  ADLs: Maximal assistance General ADL Comments: all ADLs at bed level at this time due to pain with all movement; pt declined further mobility due to fear of exacerbation of pain     Vision Baseline Vision/History: 1 Wears glasses Ability to See in Adequate Light: 0 Adequate Vision Assessment?: No apparent visual deficits     Perception     Praxis      Pertinent Vitals/Pain Pain Assessment: Faces Faces Pain Scale: Hurts whole lot Pain Location: lower back Pain  Descriptors / Indicators: Discomfort;Grimacing;Guarding;Sharp;Shooting Pain Intervention(s): Limited activity within patient's tolerance;Monitored during session     Hand Dominance Right   Extremity/Trunk Assessment Upper Extremity Assessment Upper Extremity Assessment: Overall WFL for tasks assessed   Lower Extremity Assessment Lower Extremity Assessment: Defer to PT evaluation   Cervical / Trunk Assessment Cervical / Trunk Assessment: Other exceptions Cervical / Trunk Exceptions: s/p back sx and drain placement   Communication Communication Communication: No difficulties   Cognition Arousal/Alertness: Awake/alert Behavior During Therapy: WFL for tasks assessed/performed;Anxious Overall Cognitive Status: Within Functional Limits for tasks assessed                                 General Comments: Upon movement pt felt a "popping" pain that was similar to what he felt recently during his MRI; he was afraid to move after that due to the increase in pain   General Comments  VSS on RA; pt with report of anxiety and fear of pain with movement    Exercises     Shoulder Instructions      Home Living Family/patient expects to be discharged to:: Private residence Living Arrangements: Spouse/significant other Available Help at Discharge: Available 24 hours/day Type of Home: House Home Access: Stairs to enter [employer] of Steps: approximately 14 total (6-7 steps followed by a walkway then 6-7 additional steps) Entrance Stairs-Rails: Right;Can reach both Home Layout: Two level;Able to live on main level with bedroom/bathroom Alternate Level Stairs-Number of Steps: flight   Bathroom Shower/Tub: Chief Strategy Officer: Handicapped height Bathroom Accessibility: Yes   Home Equipment: Shower seat;Bedside commode;Walker - 2 wheels;Cane - single point;Walker - 4 wheels;Other (comment);Toilet riser;Wheelchair - manual   Additional Comments: pt's  wife leaves the home to work sometimes, his nephew stays with him while she is out      Prior Functioning/Environment Level of Independence: Needs assistance  Gait / Transfers Assistance Needed: Was able to walk 50-100 ft without AD, used cane for longer distances, but uses RW on uneven surfaces. Pt was working with PT to incrase distances and decrease fall risk ADL's / Homemaking Assistance Needed: Wife assists in dressing and bathing bottom half. Wife does cooking and cleaning. Pt manages own meds and finances. Pt works as an Investment banker, corporate / Engineer, manufacturing Needed: WFL          OT Problem List: Decreased strength;Decreased range of motion;Decreased activity tolerance;Impaired balance (sitting and/or standing);Decreased safety awareness;Decreased knowledge of use of DME or AE;Decreased knowledge of precautions;Pain      OT Treatment/Interventions: Self-care/ADL training;Therapeutic exercise;DME and/or AE instruction;Therapeutic activities;Patient/family education;Balance training    OT Goals(Current goals can be found in the care plan section) Acute Rehab OT Goals Patient Stated Goal: less pain OT Goal Formulation: With patient Time For Goal Achievement: 03/30/21 Potential to Achieve Goals: Fair ADL Goals Pt Will Perform Grooming: with supervision;standing Pt Will Perform Lower Body Bathing: with min assist;sit to/from  stand Pt Will Perform Lower Body Dressing: with min assist;with adaptive equipment;sit to/from stand Pt Will Transfer to Toilet: with supervision;ambulating Pt Will Perform Toileting - Clothing Manipulation and hygiene: with modified independence;sitting/lateral leans  OT Frequency: Min 2X/week   Barriers to D/C:            Co-evaluation              AM-PAC OT "6 Clicks" Daily Activity     Outcome Measure Help from another person eating meals?: Total Help from another person taking care of personal grooming?: A Little Help from another  person toileting, which includes using toliet, bedpan, or urinal?: A Lot Help from another person bathing (including washing, rinsing, drying)?: A Lot Help from another person to put on and taking off regular upper body clothing?: A Little Help from another person to put on and taking off regular lower body clothing?: A Lot 6 Click Score: 13   End of Session Nurse Communication: Mobility status;Patient requests pain meds;Precautions  Activity Tolerance: Patient limited by pain Patient left: in bed;with call bell/phone within reach;with bed alarm set  OT Visit Diagnosis: Unsteadiness on feet (R26.81);Other abnormalities of gait and mobility (R26.89);Muscle weakness (generalized) (M62.81);Repeated falls (R29.6);Pain                Time: 0812-0830 OT Time Calculation (min): 18 min Charges:  OT General Charges $OT Visit: 1 Visit OT Evaluation $OT Eval Moderate Complexity: 1 Mod   Mindie Rawdon A Zonnique Norkus 03/16/2021, 8:49 AM

## 2021-03-16 NOTE — Progress Notes (Signed)
Orthopedic Tech Progress Note Patient Details:  Marco Kennedy [birth year] [id number]  Patient stated " his wife was bringing up his TLSO BRACE from home" THERAPY and his RN was at bedside when he said this   [name], male   DOB: 02/19/1951, 70 y.o.   MRN: [id number]  Marco Kennedy 03/16/2021, 11:48 AM

## 2021-03-16 NOTE — Evaluation (Signed)
Physical Therapy Evaluation Patient Details Name: Marco Kennedy MRN: [id number] DOB: 11-Jun-1951 Today's Date: 03/16/2021  History of Present Illness  Pt is a 70 y.o. 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 H eis now s/p T9-T10 laminectomy for decompression of thecal sac and debridement of abscess 9/18. Pt had back sx  01/2020 with Dr. Venetia Maxon; admission 7/16-22 for sepsis and again 7/26 fof SOB and fever. PMHx: back sx, arthritis, degenerative lumbar spinal stenosis, THA 2017.   Clinical Impression  Patient received in bed with NT finishing bathing patient. Patient is pain limited, requires mod + 2 assist for bed mobility. Unable to attempt standing this session. He will continue to benefit from skilled PT while here to improve mobility and independence.        Recommendations for follow up therapy are one component of a multi-disciplinary discharge planning process, led by the attending physician.  Recommendations may be updated based on patient status, additional functional criteria and insurance authorization.  Follow Up Recommendations CIR;Supervision/Assistance - 24 hour    Equipment Recommendations  Other (comment) (TBD)    Recommendations for Other Services Rehab consult     Precautions / Restrictions Precautions Precautions: Fall;Back Precaution Booklet Issued: No Precaution Comments: has brace at home that wife should bring Required Braces or Orthoses: Spinal Brace Restrictions Weight Bearing Restrictions: No      Mobility  Bed Mobility Overal bed mobility: Needs Assistance Bed Mobility: Supine to Sit;Rolling;Sidelying to Sit;Sit to Supine;Sit to Sidelying Rolling: Mod assist Sidelying to sit: Mod assist Supine to sit: Mod assist Sit to supine: Mod assist Sit to sidelying: Mod assist General bed mobility comments: patient required mod assist for raising trunk to seated position due to pain. Requires  assist to bring legs back up onto bed and for log rolling.    Transfers Overall transfer level: Needs assistance               General transfer comment: defer due to pain  Ambulation/Gait             General Gait Details: unable  Stairs            Wheelchair Mobility    Modified Rankin (Stroke Patients Only)       Balance Overall balance assessment: Needs assistance Sitting-balance support: Bilateral upper extremity supported Sitting balance-Leahy Scale: Poor Sitting balance - Comments: pain limited                                     Pertinent Vitals/Pain Pain Assessment: Faces Faces Pain Scale: Hurts whole lot Pain Location: lower back Pain Descriptors / Indicators: Discomfort;Grimacing;Guarding;Sharp;Shooting;Moaning Pain Intervention(s): Limited activity within patient's tolerance;Monitored during session;Repositioned;RN gave pain meds during session    Home Living Family/patient expects to be discharged to:: Private residence Living Arrangements: Spouse/significant other Available Help at Discharge: Available 24 hours/day Type of Home: House Home Access: Stairs to enter Entrance Stairs-Rails: Right;Can reach both Entrance Stairs-Number of Steps: approximately 14 total (6-7 steps followed by a walkway then 6-7 additional steps) Home Layout: Two level;Able to live on main level with bedroom/bathroom Home Equipment: Shower seat;Bedside commode;Walker - 2 wheels;Cane - single point;Walker - 4 wheels;Other (comment);Toilet riser;Wheelchair - manual Additional Comments: pt's wife leaves the home to work sometimes, his nephew stays with him while she is out    Prior Function Level of Independence: Needs assistance  Gait / Transfers Assistance Needed: Was able to walk 50-100 ft without AD, used cane for longer distances, but uses RW on uneven surfaces. Pt was working with PT to incrase distances and decrease fall risk  ADL's /  Homemaking Assistance Needed: Wife assists in dressing and bathing bottom half. Wife does cooking and cleaning. Pt manages own meds and finances. Pt works as an Airline pilot  Comments: Pt is an Airline pilot.     Hand Dominance   Dominant Hand: Right    Extremity/Trunk Assessment   Upper Extremity Assessment Upper Extremity Assessment: Defer to OT evaluation    Lower Extremity Assessment Lower Extremity Assessment: RLE deficits/detail;LLE deficits/detail RLE: Unable to fully assess due to pain RLE Sensation: decreased light touch RLE Coordination: decreased gross motor LLE: Unable to fully assess due to pain LLE Sensation: decreased light touch LLE Coordination: decreased gross motor    Cervical / Trunk Assessment Cervical / Trunk Assessment: Other exceptions Cervical / Trunk Exceptions: s/p back sx and drain placement  Communication   Communication: No difficulties  Cognition Arousal/Alertness: Awake/alert Behavior During Therapy: WFL for tasks assessed/performed Overall Cognitive Status: Within Functional Limits for tasks assessed                                 General Comments: Upon movement pt felt a "popping" pain that was similar to what he felt recently during his MRI; he was afraid to move after that due to the increase in pain      General Comments General comments (skin integrity, edema, etc.): VSS on RA; pt with report of anxiety and fear of pain with movement    Exercises     Assessment/Plan    PT Assessment Patient needs continued PT services  PT Problem List Decreased strength;Decreased mobility;Decreased activity tolerance;Decreased balance;Pain;Impaired sensation       PT Treatment Interventions DME instruction;Therapeutic activities;Gait training;Therapeutic exercise;Stair training;Functional mobility training;Neuromuscular re-education;Balance training;Patient/family education    PT Goals (Current goals can be found in the Care Plan  section)  Acute Rehab PT Goals Patient Stated Goal: less pain PT Goal Formulation: With patient Time For Goal Achievement: 03/30/21 Potential to Achieve Goals: Fair    Frequency Min 5X/week   Barriers to discharge Inaccessible home environment;Decreased caregiver support      Co-evaluation               AM-PAC PT "6 Clicks" Mobility  Outcome Measure Help needed turning from your back to your side while in a flat bed without using bedrails?: A Lot Help needed moving from lying on your back to sitting on the side of a flat bed without using bedrails?: A Lot Help needed moving to and from a bed to a chair (including a wheelchair)?: Total Help needed standing up from a chair using your arms (e.g., wheelchair or bedside chair)?: Total Help needed to walk in hospital room?: Total Help needed climbing 3-5 steps with a railing? : Total 6 Click Score: 8    End of Session   Activity Tolerance: Patient limited by pain Patient left: in bed;with nursing/sitter in room Nurse Communication: Mobility status PT Visit Diagnosis: Other abnormalities of gait and mobility (R26.89);Pain Pain - part of body:  (back)    Time: 1093-2355 PT Time Calculation (min) (ACUTE ONLY): 23 min   Charges:   PT Evaluation $PT Eval Moderate Complexity: 1 Mod PT Treatments $Therapeutic Activity: 8-22 mins  Rojelio Uhrich, PT, GCS 03/16/21,11:25 AM

## 2021-03-16 NOTE — Consult Note (Signed)
[facility]    Date of Admission:  03/14/2021     Reason for Consult: hardware associated infection, epidural spinal abscess/vertebral om    Referring Provider: Criselda Peaches    Lines:  7/21-c Right ue picc      Abx: 9/17-c vanc 9/17-c cefepime  7/21-9/17 cefazolin     Assessment/Plan 70 yo male previous thoracolumbar hardware/back surgery 03/2020 complicated by thoracic vertebral om/epidural abscess s/p recent I&D 7/19 with cx ecoli (bcx negative at that time), with subsequent bilateral empyema (fluid cx negative) and intraabd fluid collection (cx negative 8/02). Has had complex course requiring bilateral chest tube and abd collection drain (cx negative). The abd drain was removed 8/30 after 8/20 repeat ct showed improving abscess size. He was readmitted now 9/17 after worsening LE pain and also mri repeat 9/16 showing worsening thoracic epidural process along with increasing abd fluid collection.      #vertebral OM/epidural abscess -- hardware related #retroperitoneal fluid collection s/p drain removal 8/30 #exudative bilateral pleural effusion/empyema s/p bilateral chest tubes Initial epidural abscess I&D 7/19; cx ecoli Pleural fluid cx negative and abd abscess cx negative 7/27 tte no evidence endocarditis 9/16 mri thoracic spine worsening thoracic epidural process; also clinically worsening LE pain, s/p I&D 9/17 9/17 operative cx ngtd 9/17 admission bcx ngtd  In terms of microbiologic diagnosis, suspect this is hardware related issue being barrier to clearance of infection. In the lesser probability of abx lackign coverage, would worry more about resistance development rather than new organisms. Will switch abx to ertapenem pending cx result  Agree with new perc drain into abd abscess per IR   -stop vanc/cefepime -start ertapenem -agree with asking IR to repeat drain placement into the abd abscess   I spent more than 35 minute  reviewing data/chart, and coordinating care and >50% direct face to face time providing counseling/discussing diagnostics/treatment plan with patient     ------------------------------------------------ Active Problems:   Vertebral osteomyelitis (HCC)   Empyema (HCC)   Sepsis due to Escherichia coli (E. coli) (HCC)   Epidural abscess    HPI: Marco Kennedy is a 70 y.o. male admitted after repeat mri for thoracic/lumbar vertebral OM show worsening changes, and also hx concomittant empyema, intraabd fluid collection  Patient admitted 9/17 and had undergone repeat I&D by NSG. Cx ngtd Repeat abd ct imaging also showed increased abd cavity fluid collection where the previous drain was  He denies ongoing f/c or fatigue/poor appetite He did complain of worsening bilateral LE pain/spasm No urinary/bowel incontinence  He takes cefazolin at home for the past several weeks  Started on vanc/cefepime here    Family History  Problem Relation Age of Onset   Hypertension Mother    Arthritis Mother    Stomach cancer Father    Hypertension Brother     Social History   Tobacco Use   Smoking status: Never   Smokeless tobacco: Never  Vaping Use   Vaping Use: Never used  Substance Use Topics   Alcohol use: Not Currently    Comment: rare- social    Drug use: No    No Known Allergies  Review of Systems: ROS All Other ROS was negative, except mentioned above   Past Medical History:  Diagnosis Date   Arthritis    arthritis-bilateral knees, left Hip.   Degenerative lumbar spinal stenosis    EKG abnormality    06-08-16- being evaluated by cardiologist  in [place]   Idiopathic  scoliosis of lumbar region    Joint stiffness    Knee pain    Leg swelling    Low back pain    Achy, shooting pain.  Hurts to Walk or stand up straight.   Lumbar radiculopathy    Scoliosis concern    Sleep apnea    Bipap use nightly 25/19 -3l/m oxygen   Spondylolisthesis of lumbar region     Transfusion history    [place] ,[place] after bleeding post colon polyp removal and colonscopy procedure       Scheduled Meds:  Chlorhexidine Gluconate Cloth  6 each Topical Daily   ertapenem  1 g Intramuscular Q24H   fentaNYL       lidocaine       midazolam       polyethylene glycol  17 g Oral Daily   senna-docusate  1 tablet Oral Daily   Continuous Infusions: PRN Meds:.acetaminophen, fentaNYL, HYDROmorphone (DILAUDID) injection, oxyCODONE   OBJECTIVE: Blood pressure (!) 162/91, pulse 82, temperature 98.8 F (37.1 C), temperature source Oral, resp. rate 18, height 5\' 7"  (1.702 m), weight 106.1 kg, SpO2 96 %.  Physical Exam  General/constitutional: no distress, pleasant HEENT: Normocephalic, PER, Conj Clear, EOMI, Oropharynx clear Neck supple CV: rrr no mrg Lungs: clear to auscultation, normal respiratory effort Abd: Soft, Nontender Ext: no edema Skin: No Rash Neuro: nonfocal; LE strength full testing limited by pain MSK: surgical drain remains in place; surgical site dressing with serosanguinous stain   Central line presence: rue picc site no erythema/purulence   Lab Results Lab Results  Component Value Date   WBC 14.3 (H) 03/15/2021   HGB 9.5 (L) 03/15/2021   HCT 30.4 (L) 03/15/2021   MCV 87.4 03/15/2021   PLT 406 (H) 03/15/2021    Lab Results  Component Value Date   CREATININE 0.64 03/16/2021   BUN 18 03/16/2021   NA 132 (L) 03/16/2021   K 3.9 03/16/2021   CL 97 (L) 03/16/2021   CO2 26 03/16/2021    Lab Results  Component Value Date   ALT 7 03/14/2021   AST 25 03/14/2021   ALKPHOS 79 03/14/2021   BILITOT 0.5 03/14/2021      Microbiology: Recent Results (from the past 240 hour(s))  Blood culture (routine x 2)     Status: None (Preliminary result)   Collection Time: 03/14/21  3:54 PM   Specimen: BLOOD  Result Value Ref Range Status   Specimen Description BLOOD SITE NOT SPECIFIED  Final   Special Requests   Final    BOTTLES DRAWN AEROBIC AND  ANAEROBIC Blood Culture adequate volume   Culture   Final    NO GROWTH 2 DAYS Performed at [facility], [address] [place], [place]    Report Status PENDING  Incomplete  Blood culture (routine x 2)     Status: None (Preliminary result)   Collection Time: 03/14/21  3:54 PM   Specimen: BLOOD  Result Value Ref Range Status   Specimen Description BLOOD LEFT ANTECUBITAL  Final   Special Requests   Final    BOTTLES DRAWN AEROBIC AND ANAEROBIC Blood Culture adequate volume   Culture   Final    NO GROWTH 2 DAYS Performed at [facility], [address] [place], [place]    Report Status PENDING  Incomplete  Resp Panel by RT-PCR (Flu A&B, Covid) Nasopharyngeal Swab     Status: None   Collection Time: 03/14/21  4:33 PM  Specimen: Nasopharyngeal Swab; Nasopharyngeal(NP) swabs in vial transport medium  Result Value Ref Range Status   SARS Coronavirus 2 by RT PCR NEGATIVE NEGATIVE Final    Comment: (NOTE) SARS-CoV-2 target nucleic acids are NOT DETECTED.  The SARS-CoV-2 RNA is generally detectable in upper respiratory specimens during the acute phase of infection. The lowest concentration of SARS-CoV-2 viral copies this assay can detect is 138 copies/mL. A negative result does not preclude SARS-Cov-2 infection and should not be used as the sole basis for treatment or other patient management decisions. A negative result may occur with  improper specimen collection/handling, submission of specimen other than nasopharyngeal swab, presence of viral mutation(s) within the areas targeted by this assay, and inadequate number of viral copies(<138 copies/mL). A negative result must be combined with clinical observations, patient history, and epidemiological information. The expected result is Negative.  Fact Sheet for Patients:  BloggerCourse.com  Fact Sheet for Healthcare Providers:   SeriousBroker.it  This test is no t yet approved or cleared by the [place] FDA and  has been authorized for detection and/or diagnosis of SARS-CoV-2 by FDA under an Emergency Use Authorization (EUA). This EUA will remain  in effect (meaning this test can be used) for the duration of the COVID-19 declaration under Section 564(b)(1) of the Act, 21 U.S.C.section 360bbb-3(b)(1), unless the authorization is terminated  or revoked sooner.       Influenza A by PCR NEGATIVE NEGATIVE Final   Influenza B by PCR NEGATIVE NEGATIVE Final    Comment: (NOTE) The Xpert Xpress SARS-CoV-2/FLU/RSV plus assay is intended as an aid in the diagnosis of influenza from Nasopharyngeal swab specimens and should not be used as a sole basis for treatment. Nasal washings and aspirates are unacceptable for Xpert Xpress SARS-CoV-2/FLU/RSV testing.  Fact Sheet for Patients: BloggerCourse.com  Fact Sheet for Healthcare Providers: SeriousBroker.it  This test is not yet approved or cleared by the [place] FDA and has been authorized for detection and/or diagnosis of SARS-CoV-2 by FDA under an Emergency Use Authorization (EUA). This EUA will remain in effect (meaning this test can be used) for the duration of the COVID-19 declaration under Section 564(b)(1) of the Act, 21 U.S.C. section 360bbb-3(b)(1), unless the authorization is terminated or revoked.  Performed at [facility], [address], [place], [place] [address]   Aerobic/Anaerobic Culture w Gram Stain (surgical/deep wound)     Status: None (Preliminary result)   Collection Time: 03/14/21  7:33 PM   Specimen: PATH Other; Tissue  Result Value Ref Range Status   Specimen Description ABSCESS  Final   Special Requests THORACIC 9 AND 10  Final   Gram Stain NO WBC SEEN NO ORGANISMS SEEN   Final   Culture   Final    NO GROWTH 2 DAYS Performed at [facility], [address], [place], [place] [address]    Report Status PENDING  Incomplete     Serology:    Imaging: If present, new imagings (plain films, ct scans, and mri) have been personally visualized and interpreted; radiology reports have been reviewed. Decision making incorporated into the Impression / Recommendations.  9/17 abd pelv ct with contrast 1. 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 There is stable lucency surrounding the T10 pedicle screws likely secondary to infection. There is paravertebral soft tissue thickening without discrete fluid collection. 2. Left pelvic fluid collection has increased in size now 5.5x3.4 cm in biggest diameter. The drain has been removed  in the interval. Correlate clinically for abscess. 3. Loculated pleural fluid collection in the right lower hemithorax has slightly decreased in size, but has not resolved. Findings are likely related to patient's known empyema.  9/17 mri thoracic spine 1. Worsening findings of discitis-osteomyelitis at T9-10 and T10-11 with enlargement of the epidural abscess at these levels resulting in severe spinal stenosis at T10-11. No cord edema. 2. Persistent extensive paravertebral inflammation/phlegmon. Decreased size of loculated bilateral pleural collections.  Raymondo Band, MD [facility] [phone] pager    03/16/2021, 1:10 PM

## 2021-03-16 NOTE — Plan of Care (Signed)

## 2021-03-16 NOTE — Consult Note (Signed)
Chief Complaint: Patient was seen in consultation today for retroperitoneal abscess drain placement at the request of Dr Angela Burke   Supervising Physician: Ruthann Cancer  Patient Status: [facility] - In-pt  History of Present Illness: Marco Kennedy is a 70 y.o. 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 He is now s/p T9-T10 laminectomy for decompression of thecal sac and debridement of abscess 9/18. Pt had back sx  01/2020 with Dr. Vertell Limber; admission 01/10/21 for sepsis and again 7/26 fof SOB and fever. PMHx: back sx, arthritis, degenerative lumbar spinal stenosis  IR placed left retroperitoneal abscess drain 01/28/21 Removed: 02/24/21 after resolution of abscess  OR with Dr Kathyrn Sheriff 03/14/21: T9-T10 laminectomy for decompression of thecal sac  CT 9/17: IMPRESSION: 1. 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 There is stable lucency surrounding the T10 pedicle screws likely secondary to infection. There is paravertebral soft tissue thickening without discrete fluid collection. 2. Left pelvic fluid collection has increased in size. The drain has been removed in the interval. Correlate clinically for abscess. 3. Loculated pleural fluid collection in the right lower hemithorax has slightly decreased in size, but has not resolved. Findings are likely related to patient's known empyema.  Fever Leukocytosis Specimen Description ABSCESS   Special Requests THORACIC 9 AND 10   Gram Stain NO WBC SEEN  NO ORGANISMS SEEN  Performed at [facility], [address], [place], [place] [address]     Request made for recurrent RP abscess drain placement Dr Serafina Royals has reviewed imaging and approves procedure  Past Medical History:  Diagnosis Date   Arthritis    arthritis-bilateral knees, left Hip.   Degenerative lumbar spinal stenosis    EKG abnormality    06-08-16- being evaluated by cardiologist  in [place]    Idiopathic scoliosis of lumbar region    Joint stiffness    Knee pain    Leg swelling    Low back pain    Achy, shooting pain.  Hurts to Walk or stand up straight.   Lumbar radiculopathy    Scoliosis concern    Sleep apnea    Bipap use nightly 25/19 -3l/m oxygen   Spondylolisthesis of lumbar region    Transfusion history    [place] ,[place] after bleeding post colon polyp removal and colonscopy procedure    Past Surgical History:  Procedure Laterality Date   ABDOMINAL EXPOSURE N/A 04/18/2020   Procedure: ABDOMINAL EXPOSURE;  Surgeon: Marty Heck, MD;  Location: [place];  Service: Vascular;  Laterality: N/A;   ANTERIOR LAT LUMBAR FUSION Left 04/18/2020   Procedure: ANTERIOR LATERAL LUMBAR FUSION LUMBAR TWO-THREE, LUMBAR THREE-FOUR;  Surgeon: Erline Levine, MD;  Location: [place];  Service: Neurosurgery;  Laterality: Left;   ANTERIOR LUMBAR FUSION N/A 04/18/2020   Procedure: Lumbar Four-Five Lumbar Five- Sacral One Anterior lumbar Interbody Fusion;  Surgeon: Erline Levine, MD;  Location: [place];  Service: Neurosurgery;  Laterality: N/A;   APPLICATION OF INTRAOPERATIVE CT SCAN N/A 04/21/2020   Procedure: APPLICATION OF INTRAOPERATIVE CT SCAN;  Surgeon: Erline Levine, MD;  Location: [place];  Service: Neurosurgery;  Laterality: N/A;   BACK SURGERY     lumbar disc surgery   COLONOSCOPY W/ POLYPECTOMY     HERNIA REPAIR Left    left inguinal hernia repair   IR RADIOLOGIST EVAL & MGMT  02/24/2021   KNEE ARTHROSCOPY Right 2003    -scope for meniscus   LUMBAR WOUND DEBRIDEMENT N/A 01/13/2021   Procedure:  THORACOLUMBAR WOUND DEBRIDEMENT;  Surgeon: Erline Levine, MD;  Location: [place];  Service: Neurosurgery;  Laterality: N/A;   LUMBAR WOUND DEBRIDEMENT N/A 03/14/2021   Procedure: WOUND EXPLORATION AND DEBRIDEMENT OF ABSCESS;  Surgeon: Consuella Lose, MD;  Location: [place];  Service: Neurosurgery;  Laterality: N/A;   POSTERIOR LUMBAR FUSION 4 LEVEL N/A 04/21/2020   Procedure: Thoracic ten to  pelvis pedicle screw fixation with osteotomies;  Surgeon: Erline Levine, MD;  Location: [place];  Service: Neurosurgery;  Laterality: N/A;   THORACIC LAMINECTOMY FOR EPIDURAL ABSCESS N/A 03/14/2021   Procedure: THORACIC LAMINECTOMY, THORACIC NINE-THORACIC TEN;  Surgeon: Consuella Lose, MD;  Location: [place];  Service: Neurosurgery;  Laterality: N/A;   TOTAL HIP ARTHROPLASTY Left 06/15/2016   Procedure: LEFT TOTAL HIP ARTHROPLASTY ANTERIOR APPROACH;  Surgeon: Paralee Cancel, MD;  Location: [facility];  Service: Orthopedics;  Laterality: Left;    Allergies: Patient has no known allergies.  Medications: Prior to Admission medications   Medication Sig Start Date End Date Taking? Authorizing Provider  acetaminophen (TYLENOL) 325 MG tablet Take 2 tablets (650 mg total) by mouth every 6 (six) hours as needed for mild pain (or Fever >/= 101). 02/03/21  Yes Rizwan, Saima, MD  amLODipine (NORVASC) 5 MG tablet Take 1 tablet (5 mg total) by mouth daily. 02/16/21  Yes Angiulli, Lavon Paganini, PA-C  ceFAZolin (ANCEF) 2-4 GM/100ML-% IVPB Inject 100 mLs (2 g total) into the vein every 8 (eight) hours. 02/16/21  Yes Angiulli, Lavon Paganini, PA-C  cholecalciferol (VITAMIN D3) 25 MCG (1000 UNIT) tablet Take 1 tablet (1,000 Units total) by mouth daily. 02/16/21  Yes Angiulli, Lavon Paganini, PA-C  diclofenac (VOLTAREN) 75 MG EC tablet Take 75 mg by mouth 2 (two) times daily. 02/13/21  Yes [provider]  docusate sodium (COLACE) 100 MG capsule Take 100 mg by mouth daily.   Yes [provider]  Multiple Vitamin (MULTIVITAMIN WITH MINERALS) TABS tablet Take 1 tablet by mouth daily.   Yes [provider]  oxyCODONE (OXY IR/ROXICODONE) 5 MG immediate release tablet Take 5 mg by mouth every 6 (six) hours as needed for severe pain.   Yes [provider]  polyethylene glycol (MIRALAX / GLYCOLAX) 17 g packet Take 17 g by mouth 2 (two) times daily. Patient taking differently: Take 17 g by mouth daily as needed for  mild constipation. 01/15/21  Yes Little Ishikawa, MD  ceFAZolin (ANCEF) IVPB Inject 2 g into the vein every 8 (eight) hours. Indication:  abscess and empyema First Dose: Yes Last Day of Therapy:  03/11/21 Labs - Once weekly:  CBC/D and BMP, Labs - Every other week:  ESR and CRP Method of administration: IV Push Method of administration may be changed at the discretion of home infusion pharmacist based upon assessment of the patient and/or caregiver's ability to self-administer the medication ordered. Patient not taking: Reported on 03/14/2021 02/12/21 03/18/21  Angiulli, Lavon Paganini, PA-C  colchicine 0.6 MG tablet Take 1 tablet (0.6 mg total) by mouth daily. Patient not taking: No sig reported 02/16/21   Angiulli, Lavon Paganini, PA-C     Family History  Problem Relation Age of Onset   Hypertension Mother    Arthritis Mother    Stomach cancer Father    Hypertension Brother     Social History   Socioeconomic History   Marital status: Married    Spouse name: Not on file   Number of children: 2   Years of education: Not on file   Highest  education level: Not on file  Occupational History   Not on file  Tobacco Use   Smoking status: Never   Smokeless tobacco: Never  Vaping Use   Vaping Use: Never used  Substance and Sexual Activity   Alcohol use: Not Currently    Comment: rare- social    Drug use: No   Sexual activity: Yes  Other Topics Concern   Not on file  Social History Narrative   Not on file   Social Determinants of Health   Financial Resource Strain: Not on file  Food Insecurity: Not on file  Transportation Needs: Not on file  Physical Activity: Not on file  Stress: Not on file  Social Connections: Not on file    Review of Systems: A 12 point ROS discussed and pertinent positives are indicated in the HPI above.  All other systems are negative.  Review of Systems  Constitutional:  Positive for activity change. Negative for fever.  Respiratory:  Negative for cough  and shortness of breath.   Cardiovascular:  Negative for chest pain.  Gastrointestinal:  Positive for abdominal distention. Negative for diarrhea.  Musculoskeletal:  Positive for back pain and gait problem.  Psychiatric/Behavioral:  Negative for behavioral problems and confusion.    Vital Signs: BP (!) 153/86 (BP Location: Left Arm)   Pulse 81   Temp 98.8 F (37.1 C) (Oral)   Resp 17   Ht _0  (1.702 m)   Wt 234 lb (106.1 kg)   SpO2 96%   BMI 36.65 kg/m   Physical Exam Vitals reviewed.  HENT:     Mouth/Throat:     Mouth: Mucous membranes are moist.  Cardiovascular:     Rate and Rhythm: Normal rate and regular rhythm.     Heart sounds: Normal heart sounds.  Pulmonary:     Breath sounds: Normal breath sounds.  Abdominal:     General: There is distension.     Palpations: Abdomen is soft.  Musculoskeletal:        General: Normal range of motion.  Skin:    General: Skin is warm.  Neurological:     Mental Status: He is alert and oriented to person, place, and time.  Psychiatric:        Behavior: Behavior normal.    Imaging: DG Thoracic Spine 2 View  Result Date: 03/14/2021 CLINICAL DATA:  T9-10 laminectomy EXAM: THORACIC SPINE 2 VIEWS COMPARISON:  Thoracic spine MRI 03/14/2021 FINDINGS: Based on numbering from the recent MRI that shows the most superior level of thoracic spine hardware to be T10, the third provided image shows a probe with its tip in line with the T8-9 disc space. The more proximal portion may be abutting the underside of the T9 lamina, but visualization is obscured by the tissue spreader. IMPRESSION: Probe tip at the T8-9 disc space. Electronically Signed   By: Ulyses Jarred M.D.   On: 03/14/2021 21:05   MR THORACIC SPINE WO CONTRAST  Result Date: 03/14/2021 CLINICAL DATA:  Follow-up epidural abscess. EXAM: MRI THORACIC SPINE WITHOUT CONTRAST TECHNIQUE: Multiplanar, multisequence MR imaging of the thoracic spine was performed. No intravenous contrast was  administered. COMPARISON:  01/28/2021 FINDINGS: Alignment: Mildly increased kyphosis in the lower thoracic spine. Minimal retrolisthesis of T11 on T12 and T12 on L1. Vertebrae: Posterior spinal fusion instrumentation is again partially visualized beginning at T10 and extending inferiorly into the lumbar spine. There is progressive, extensive marrow edema throughout the T9, T10, and T11 vertebral bodies. This is associated with  increased fluid signal in the T10-11 greater than T9-10 disc spaces with associated endplate erosion, most notable at T11 where there is moderate to severe anterior vertebral body height loss. There is a persistent ventral epidural fluid collection which does not extend as far cranially as on the prior MRI (currently T8 and previously T7), however the overall size of the current collection at the T9 and T10 levels is larger than on the prior study and results in increased, moderate spinal stenosis at T9-10 and severe spinal stenosis at T10-11 with moderate cord flattening. Cord:  No definite cord edema. Paraspinal and other soft tissues: Persistent extensive paravertebral inflammation/phlegmon in the lower thoracic spine. Decreased size of loculated bilateral pleural collections. Disc levels: Postoperative an infectious findings in the lower thoracic spine as detailed above. Minor spondylosis and moderate facet arthrosis more superiorly in the thoracic spine without associated spinal stenosis. IMPRESSION: 1. Worsening findings of discitis-osteomyelitis at T9-10 and T10-11 with enlargement of the epidural abscess at these levels resulting in severe spinal stenosis at T10-11. No cord edema. 2. Persistent extensive paravertebral inflammation/phlegmon. Decreased size of loculated bilateral pleural collections. These results will be called to the ordering clinician or representative by the Radiologist Assistant, and communication documented in the PACS or [employer]. Electronically Signed    By: Logan Bores M.D.   On: 03/14/2021 12:15   CT ABDOMEN PELVIS W CONTRAST  Result Date: 03/14/2021 CLINICAL DATA:  Follow-up for epidural abscess. EXAM: CT ABDOMEN AND PELVIS WITH CONTRAST TECHNIQUE: Multidetector CT imaging of the abdomen and pelvis was performed using the standard protocol following bolus administration of intravenous contrast. CONTRAST:  171m OMNIPAQUE IOHEXOL 350 MG/ML SOLN COMPARISON:  MRI thoracic spine 03/14/2021. CT abdomen and pelvis 02/16/2021. FINDINGS: Lower chest: Again seen are small bilateral pleural effusions similar to the prior study. Loculated pleural fluid with imperceptible wall in the right lower hemithorax has decreased in size now measuring 2.1 x 3.0 cm. Air is no longer seen within this collection. Hepatobiliary: No focal liver abnormality is seen. No gallstones, gallbladder wall thickening, or biliary dilatation. Pancreas: Unremarkable. No pancreatic ductal dilatation or surrounding inflammatory changes. Spleen: Normal in size without focal abnormality. Adrenals/Urinary Tract: The bilateral kidneys are within normal limits. There is no hydronephrosis. Bladder is decompressed and not well evaluated secondary to streak artifact in the pelvis, but grossly within normal limits. The adrenal glands are within normal limits. Stomach/Bowel: Stomach is within normal limits. Appendix appears normal. No evidence of bowel wall thickening, distention, or inflammatory changes. There is a small hiatal hernia, unchanged. Vascular/Lymphatic: No significant vascular findings are present. No enlarged abdominal or pelvic lymph nodes. Reproductive: Prostate is unremarkable. Other: There is no ascites. There is a small fat containing umbilical hernia. Pelvic percutaneous drainage catheter has been removed in the interval. Lower left retroperitoneal fluid collection is again seen measuring 5.5 x 2.3 by 3.4 cm. This has slightly increased in size in the interval. There is no air within this  collection. Musculoskeletal: There is subcutaneous posterior lumbar scarring without discrete fluid collection identified. T10-S1 posterior fusion hardware is again noted. Endplate changes at TE5-U31and T10-T11 correspond to patient's known osteomyelitis/discitis seen on recent MRI. There is lucency surrounding the pedicle screws at T10 similar to the prior study. There is perivertebral soft tissue thickening at these infected levels without discrete new drainable fluid collection. Left hip arthroplasty is present. IMPRESSION: 1. 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 There is stable lucency surrounding the T10 pedicle  screws likely secondary to infection. There is paravertebral soft tissue thickening without discrete fluid collection. 2. Left pelvic fluid collection has increased in size. The drain has been removed in the interval. Correlate clinically for abscess. 3. Loculated pleural fluid collection in the right lower hemithorax has slightly decreased in size, but has not resolved. Findings are likely related to patient's known empyema. Electronically Signed   By: Ronney Asters M.D.   On: 03/14/2021 17:34   CT ABDOMEN PELVIS W CONTRAST  Result Date: 02/16/2021 CLINICAL DATA:  70 year old male with history of left retroperitoneal drain. EXAM: CT ABDOMEN AND PELVIS WITH CONTRAST TECHNIQUE: Multidetector CT imaging of the abdomen and pelvis was performed using the standard protocol following bolus administration of intravenous contrast. CONTRAST:  60m OMNIPAQUE IOHEXOL 350 MG/ML SOLN COMPARISON:  CT the abdomen and pelvis 01/26/2021. FINDINGS: Lower chest: Small bilateral pleural effusions are noted dependently. In the medial aspect of the lower right hemithorax there is a loculated component of this effusion which has internal locules of gas, compatible with residual empyema. Small amount of pleural thickening and enhancement noted in the lower right hemithorax.  No definite pleural enhancement noted in the lower left hemithorax. Overall, pleural effusions are smaller than the prior examination. Some associated dependent subsegmental atelectasis in the lower lobes of the lungs bilaterally. Hepatobiliary: No suspicious cystic or solid hepatic lesions. The appearance of the gallbladder is normal. Pancreas: No pancreatic mass. No pancreatic ductal dilatation. No pancreatic or peripancreatic fluid collections or inflammatory changes. Spleen: Unremarkable. Adrenals/Urinary Tract: Subcentimeter low-attenuation lesion in the anterior aspect of the interpolar region of the right kidney, too small to characterize, but statistically likely to represent a tiny cyst. Left kidney and bilateral adrenal glands are normal in appearance. No hydroureteronephrosis. Urinary bladder is normal in appearance. Stomach/Bowel: The appearance of the stomach is normal. There is no pathologic dilatation of small bowel or colon. Normal appendix. Vascular/Lymphatic: Aortic atherosclerosis, without evidence of aneurysm or dissection in the abdominal or pelvic vasculature. Multiple prominent borderline enlarged retroperitoneal lymph nodes are noted, likely reactive. No definite pathologically enlarged lymph nodes are noted in the abdomen or pelvis. Reproductive: Prostate gland and seminal vesicles are unremarkable in appearance. Other: There has been interval placement of a pigtail drainage catheter in the left hemipelvis in the left iliac fossa. Previously noted left-sided pelvic fluid collection has substantially decreased in size, with only a small amount of residual fluid predominantly adjacent to the medial aspect of the drain, currently measuring approximately 3.9 x 1.9 cm (axial image 62 of series 3). No significant volume of ascites. No pneumoperitoneum. Musculoskeletal: Orthopedic rod and screw fixation hardware is noted from T10-S2 with persistent lucency adjacent to the fixation screws  bilaterally at T10, similar to the prior examination. Interbody cages are present at L2-L3 and L3-L4. ALIF noted at L4-L5 and L5-S1. Status post left hip arthroplasty. There are no aggressive appearing lytic or blastic lesions noted in the visualized portions of the skeleton. IMPRESSION: 1. Left pelvic drain in the left iliac fossa has effectively drained nearly all of the previously noted left-sided pelvic fluid collection, as discussed above. 2. Persistent bilateral pleural effusions. On the left, this is simple in appearance on today's examination. On the right, there is persistent loculation and persistent gas and pleural thickening/enhancement, indicative of residual empyema. 3. Aortic atherosclerosis. 4. Additional incidental findings, as above. Electronically Signed   By: DVinnie LangtonM.D.   On: 02/16/2021 11:33   IR Radiologist Eval & Mgmt  Result Date:  02/24/2021 Please refer to notes tab for details about interventional procedure. (Op Note)   Labs:  CBC: Recent Labs    02/06/21 0506 02/13/21 0439 03/14/21 1347 03/15/21 1127  WBC 11.9* 8.7 12.8* 14.3*  HGB 8.8* 8.6* 11.9* 9.5*  HCT 28.7* 27.9* 38.8* 30.4*  PLT 435* 377 415* 406*    COAGS: Recent Labs    01/31/21 0500  INR 1.3*    BMP: Recent Labs    02/13/21 0439 03/14/21 1347 03/15/21 1127 03/16/21 0459  NA 133* 138 137 132*  K 4.1 4.2 4.3 3.9  CL 94* 100 98 97*  CO2 _0 GLUCOSE 110* 144* 146* 144*  BUN 10 30* 20 18  CALCIUM 8.4* 9.5 9.0 8.6*  CREATININE 0.64 0.76 0.70 0.64  GFRNONAA >60 >60 >60 >60    LIVER FUNCTION TESTS: Recent Labs    02/04/21 0425 02/06/21 0506 02/13/21 0439 03/14/21 1347  BILITOT 0.4 0.3 0.2* 0.5  AST 49* 47* 25 25  ALT _1 ALKPHOS 90 91 83 79  PROT 5.4* 5.5* 5.6* 7.6  ALBUMIN 1.7* 1.8* 2.0* 3.3*    TUMOR MARKERS: No results for input(s): AFPTM, CEA, CA199, CHROMGRNA in the last 8760 hours.  Assessment and Plan:  Retroperitoneal abscess--  recurrent Scheduled for abscess aspiration/drain placement Risks and benefits discussed with the patient including bleeding, infection, damage to adjacent structures, bowel perforation/fistula connection, and sepsis.  All of the patient's questions were answered, patient is agreeable to proceed. Consent signed and in chart.    Thank you for this interesting consult.  I greatly enjoyed meeting Marco Kennedy and look forward to participating in their care.  A copy of this report was sent to the requesting provider on this date.  Electronically Signed: Lavonia Drafts, PA-C 03/16/2021, 10:19 AM   I spent a total of 20 Minutes    in face to face in clinical consultation, greater than 50% of which was counseling/coordinating care for RP abscess drain

## 2021-03-17 DIAGNOSIS — M4644 Discitis, unspecified, thoracic region: Secondary | ICD-10-CM | POA: Diagnosis not present

## 2021-03-17 DIAGNOSIS — R188 Other ascites: Secondary | ICD-10-CM | POA: Diagnosis not present

## 2021-03-17 DIAGNOSIS — G062 Extradural and subdural abscess, unspecified: Secondary | ICD-10-CM | POA: Diagnosis not present

## 2021-03-17 DIAGNOSIS — M4620 Osteomyelitis of vertebra, site unspecified: Secondary | ICD-10-CM | POA: Diagnosis not present

## 2021-03-17 LAB — BASIC METABOLIC PANEL
Anion gap: 11 (ref 5–15)
BUN: 9 mg/dL (ref 8–23)
CO2: 29 mmol/L (ref 22–32)
Calcium: 9.1 mg/dL (ref 8.9–10.3)
Chloride: 91 mmol/L — ABNORMAL LOW (ref 98–111)
Creatinine, Ser: 0.5 mg/dL — ABNORMAL LOW (ref 0.61–1.24)
GFR, Estimated: >60 mL/min (ref >60)
Glucose, Bld: 135 mg/dL — ABNORMAL HIGH (ref 70–99)
Potassium: 3.6 mmol/L (ref 3.5–5.1)
Sodium: 131 mmol/L — ABNORMAL LOW (ref 135–145)

## 2021-03-17 LAB — CBC WITH DIFFERENTIAL/PLATELET
Abs Immature Granulocytes: 0.05 10*3/uL (ref 0.00–0.07)
Basophils Absolute: 0.1 10*3/uL (ref 0.0–0.1)
Basophils Relative: 1 %
Eosinophils Absolute: 0.2 10*3/uL (ref 0.0–0.5)
Eosinophils Relative: 2 %
HCT: 31.4 % — ABNORMAL LOW (ref 39.0–52.0)
Hemoglobin: 10.1 g/dL — ABNORMAL LOW (ref 13.0–17.0)
Immature Granulocytes: 1 %
Lymphocytes Relative: 7 %
Lymphs Abs: 0.7 10*3/uL (ref 0.7–4.0)
MCH: 27.5 pg (ref 26.0–34.0)
MCHC: 32.2 g/dL (ref 30.0–36.0)
MCV: 85.6 fL (ref 80.0–100.0)
Monocytes Absolute: 1.1 10*3/uL — ABNORMAL HIGH (ref 0.1–1.0)
Monocytes Relative: 11 %
Neutro Abs: 8.4 10*3/uL — ABNORMAL HIGH (ref 1.7–7.7)
Neutrophils Relative %: 78 %
Platelets: 354 10*3/uL (ref 150–400)
RBC: 3.67 MIL/uL — ABNORMAL LOW (ref 4.22–5.81)
RDW: 14 % (ref 11.5–15.5)
WBC: 10.5 10*3/uL (ref 4.0–10.5)
nRBC: 0 % (ref 0.0–0.2)

## 2021-03-17 LAB — C-REACTIVE PROTEIN: CRP: 15.1 mg/dL — ABNORMAL HIGH (ref <1.0)

## 2021-03-17 MED ORDER — HYDROMORPHONE HCL 1 MG/ML IJ SOLN
1.0000 mg | INTRAMUSCULAR | Status: DC | PRN
  Administered 2021-03-17 – 2021-04-01 (×22): 1 mg via INTRAVENOUS
  Filled 2021-03-17 (×23): qty 1

## 2021-03-17 MED ORDER — OXYCODONE HCL 5 MG PO TABS
10.0000 mg | ORAL_TABLET | ORAL | Status: DC | PRN
  Administered 2021-03-17 – 2021-03-27 (×32): 10 mg via ORAL
  Filled 2021-03-17 (×37): qty 2

## 2021-03-17 MED ORDER — HYDROMORPHONE HCL 1 MG/ML IJ SOLN: 0.5000 mg | Freq: Once | INTRAMUSCULAR | Status: DC

## 2021-03-17 MED ORDER — ACETAMINOPHEN 325 MG PO TABS
650.0000 mg | ORAL_TABLET | Freq: Four times a day (QID) | ORAL | Status: DC
  Administered 2021-03-17 – 2021-04-03 (×60): 650 mg via ORAL
  Filled 2021-03-17 (×61): qty 2

## 2021-03-17 MED ORDER — AMLODIPINE BESYLATE 5 MG PO TABS
5.0000 mg | ORAL_TABLET | Freq: Every day | ORAL | Status: DC
  Administered 2021-03-17 – 2021-04-03 (×17): 5 mg via ORAL
  Filled 2021-03-17 (×17): qty 1

## 2021-03-17 MED ORDER — HYDROMORPHONE HCL 1 MG/ML IJ SOLN
0.5000 mg | Freq: Once | INTRAMUSCULAR | Status: AC
  Administered 2021-03-17: 0.5 mg via INTRAVENOUS
  Filled 2021-03-17: qty 0.5

## 2021-03-17 NOTE — Progress Notes (Signed)
[facility]  Date of Admission:  03/14/2021     Lines:  7/21-c Right ue picc      Abx: 9/17-c vanc 9/17-c cefepime   7/21-9/17 cefazolin     Assessment/Plan 70 yo male previous thoracolumbar hardware/back surgery 03/2020 complicated by thoracic vertebral om/epidural abscess s/p recent I&D 7/19 with cx ecoli (bcx negative at that time), with subsequent bilateral empyema (fluid cx negative) and intraabd fluid collection (cx negative 8/02). Has had complex course requiring bilateral chest tube and abd collection drain (cx negative). The abd drain was removed 8/30 after 8/20 repeat ct showed improving abscess size. He was readmitted now 9/17 after worsening LE pain and also mri repeat 9/16 showing worsening thoracic epidural process along with increasing abd fluid collection.        #vertebral OM/epidural abscess -- hardware related #retroperitoneal fluid collection s/p drain removal 8/30 #exudative bilateral pleural effusion/empyema s/p bilateral chest tubes Initial epidural abscess I&D 7/19; cx ecoli Pleural fluid cx negative and abd abscess cx negative 7/27 tte no evidence endocarditis 9/16 mri thoracic spine worsening thoracic epidural process; also clinically worsening LE pain, s/p I&D 9/17 9/17 operative cx ngtd 9/17 admission bcx ngtd   In terms of microbiologic diagnosis, suspect this is hardware related issue being barrier to clearance of infection. In the lesser probability of abx lackign coverage, would worry more about resistance development rather than new organisms. Will switch abx to ertapenem pending cx result   Agree with new perc drain into abd abscess per IR  -------------- 9/20 assessment IR placed left quadrant percutaneous drain into retroperitoneal fluid. Cx in progress The spine surgical cx in progress Admission bcx negative  Again treatment's mixed response likely due to burden of disease/source control issue due to  presence of old hardware  I spoke with Dr Venetia Maxon of [facility] who is concerned about spine instability given the laminectomy, and wants to wait vs doing stabilization additional hardware due to his infection/recent response.  -From ID standpoint if an emergent procedure is needed in the spine, there is no contraindication. But agree if can wait, I would repeat abd/pelv and spine imaging in 5-7 days. And if that is reassuring (not worsening abscess formation) and no evidence sepsis, reasonable to try to place new hardware. -continue ertapenem for now -repeat abd/pelv ct and thoracic/lumbar mri this Saturday -q48hours inflammatory markers trend    I spent more than 35 minute reviewing data/chart, and coordinating care and >50% direct face to face time providing counseling/discussing diagnostics/treatment plan with patient   Active Problems:   Vertebral osteomyelitis (HCC)   Empyema (HCC)   Sepsis due to Escherichia coli (E. coli) (HCC)   Retroperitoneal fluid collection   Epidural abscess   Discitis of thoracic region   No Known Allergies  Scheduled Meds:  acetaminophen  650 mg Oral Q6H   Chlorhexidine Gluconate Cloth  6 each Topical Daily   polyethylene glycol  17 g Oral Daily   senna-docusate  1 tablet Oral Daily   sodium chloride flush  5 mL Intracatheter Q8H   Continuous Infusions:  ertapenem 1,000 mg (03/16/21 1648)   PRN Meds:.fentaNYL, HYDROmorphone (DILAUDID) injection, midazolam, oxyCODONE   SUBJECTIVE: Pain a little better in back/legs No n/v/diarrhea No urinary/bladder incontinence  No f/c S/p ir drain placement 9/19 Wound cx ngtd  Review of Systems: ROS All other ROS was negative, except mentioned above     OBJECTIVE: Vitals:   03/16/21 2016 03/17/21 0330  03/17/21 0816 03/17/21 1226  BP: (!) 150/91 (!) 146/83 (!) 151/78 (!) 156/84  Pulse: 91 91 90 88  Resp: 18 18 20 20   Temp: 98.5 F (36.9 C) 98.4 F (36.9 C) 98.5 F (36.9 C) 98.6 F (37 C)  TempSrc:  Oral Oral Oral Oral  SpO2: 96% 97% 96% 96%  Weight:      Height:       Body mass index is 36.65 kg/m.  Physical Exam  General/constitutional: no distress, pleasant HEENT: Normocephalic, PER, Conj Clear, EOMI, Oropharynx clear Neck supple CV: rrr no mrg Lungs: clear to auscultation, normal respiratory effort Abd: Soft, Nontender; left lower quadrant jp drain intact with some sediment/purulent output Ext: no edema Skin: No Rash Neuro: nonfocal  Lab Results Lab Results  Component Value Date   WBC 10.5 03/17/2021   HGB 10.1 (L) 03/17/2021   HCT 31.4 (L) 03/17/2021   MCV 85.6 03/17/2021   PLT 354 03/17/2021    Lab Results  Component Value Date   CREATININE 0.50 (L) 03/17/2021   BUN 9 03/17/2021   NA 131 (L) 03/17/2021   K 3.6 03/17/2021   CL 91 (L) 03/17/2021   CO2 29 03/17/2021    Lab Results  Component Value Date   ALT 7 03/14/2021   AST 25 03/14/2021   ALKPHOS 79 03/14/2021   BILITOT 0.5 03/14/2021      Microbiology: Recent Results (from the past 240 hour(s))  Blood culture (routine x 2)     Status: None (Preliminary result)   Collection Time: 03/14/21  3:54 PM   Specimen: BLOOD  Result Value Ref Range Status   Specimen Description BLOOD SITE NOT SPECIFIED  Final   Special Requests   Final    BOTTLES DRAWN AEROBIC AND ANAEROBIC Blood Culture adequate volume   Culture   Final    NO GROWTH 3 DAYS Performed at [facility], [address], [place], [place]    Report Status PENDING  Incomplete  Blood culture (routine x 2)     Status: None (Preliminary result)   Collection Time: 03/14/21  3:54 PM   Specimen: BLOOD  Result Value Ref Range Status   Specimen Description BLOOD LEFT ANTECUBITAL  Final   Special Requests   Final    BOTTLES DRAWN AEROBIC AND ANAEROBIC Blood Culture adequate volume   Culture   Final    NO GROWTH 3 DAYS Performed at [facility], [address], [place], [place]    Report Status PENDING   Incomplete  Resp Panel by RT-PCR (Flu A&B, Covid) Nasopharyngeal Swab     Status: None   Collection Time: 03/14/21  4:33 PM   Specimen: Nasopharyngeal Swab; Nasopharyngeal(NP) swabs in vial transport medium  Result Value Ref Range Status   SARS Coronavirus 2 by RT PCR NEGATIVE NEGATIVE Final    Comment: (NOTE) SARS-CoV-2 target nucleic acids are NOT DETECTED.  The SARS-CoV-2 RNA is generally detectable in upper respiratory specimens during the acute phase of infection. The lowest concentration of SARS-CoV-2 viral copies this assay can detect is 138 copies/mL. A negative result does not preclude SARS-Cov-2 infection and should not be used as the sole basis for treatment or other patient management decisions. A negative result may occur with  improper specimen collection/handling, submission of specimen other than nasopharyngeal swab, presence of viral mutation(s) within the areas targeted by this assay, and inadequate number of viral copies(<138 copies/mL). A negative result must be combined with clinical observations,  patient history, and epidemiological information. The expected result is Negative.  Fact Sheet for Patients:  BloggerCourse.com  Fact Sheet for Healthcare Providers:  SeriousBroker.it  This test is no t yet approved or cleared by the [place] FDA and  has been authorized for detection and/or diagnosis of SARS-CoV-2 by FDA under an Emergency Use Authorization (EUA). This EUA will remain  in effect (meaning this test can be used) for the duration of the COVID-19 declaration under Section 564(b)(1) of the Act, 21 U.S.C.section 360bbb-3(b)(1), unless the authorization is terminated  or revoked sooner.       Influenza A by PCR NEGATIVE NEGATIVE Final   Influenza B by PCR NEGATIVE NEGATIVE Final    Comment: (NOTE) The Xpert Xpress SARS-CoV-2/FLU/RSV plus assay is intended as an aid in the diagnosis of influenza  from Nasopharyngeal swab specimens and should not be used as a sole basis for treatment. Nasal washings and aspirates are unacceptable for Xpert Xpress SARS-CoV-2/FLU/RSV testing.  Fact Sheet for Patients: BloggerCourse.com  Fact Sheet for Healthcare Providers: SeriousBroker.it  This test is not yet approved or cleared by the [place] FDA and has been authorized for detection and/or diagnosis of SARS-CoV-2 by FDA under an Emergency Use Authorization (EUA). This EUA will remain in effect (meaning this test can be used) for the duration of the COVID-19 declaration under Section 564(b)(1) of the Act, 21 U.S.C. section 360bbb-3(b)(1), unless the authorization is terminated or revoked.  Performed at [facility], [address], [place], [place] [address]   Aerobic/Anaerobic Culture w Gram Stain (surgical/deep wound)     Status: None (Preliminary result)   Collection Time: 03/14/21  7:33 PM   Specimen: PATH Other; Tissue  Result Value Ref Range Status   Specimen Description ABSCESS  Final   Special Requests THORACIC 9 AND 10  Final   Gram Stain NO WBC SEEN NO ORGANISMS SEEN   Final   Culture   Final    NO GROWTH 3 DAYS NO ANAEROBES ISOLATED; CULTURE IN PROGRESS FOR 5 DAYS Performed at [facility], [address], [place], [place] [address]    Report Status PENDING  Incomplete  Aerobic/Anaerobic Culture w Gram Stain (surgical/deep wound)     Status: None (Preliminary result)   Collection Time: 03/16/21  1:57 PM   Specimen: Abscess  Result Value Ref Range Status   Specimen Description ABSCESS  Final   Special Requests ABDOMEN  Final   Gram Stain   Final    RARE WBC PRESENT, PREDOMINANTLY MONONUCLEAR NO ORGANISMS SEEN    Culture   Final    NO GROWTH < 24 HOURS Performed at [facility], [address], [place], [place] [address]    Report Status PENDING  Incomplete     Serology:   Imaging: If  present, new imagings (plain films, ct scans, and mri) have been personally visualized and interpreted; radiology reports have been reviewed. Decision making incorporated into the Impression / Recommendations.  9/17 abd pelv ct 1. 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 There is stable lucency surrounding the T10 pedicle screws likely secondary to infection. There is paravertebral soft tissue thickening without discrete fluid collection. 2. Left pelvic fluid collection has increased in size. The drain has been removed in the interval. Correlate clinically for abscess. 3. Loculated pleural fluid collection in the right lower hemithorax has slightly decreased in size, but has not resolved. Findings are likely related to patient's known empyema.  9/17 mri thoracic spine 1. Worsening findings of discitis-osteomyelitis at T9-10 and T10-11 with enlargement of the epidural abscess at these levels resulting in severe spinal stenosis at T10-11. No cord edema. 2. Persistent extensive paravertebral inflammation/phlegmon. Decreased size of loculated bilateral pleural collections.     Raymondo Band, MD [facility] [phone] pager    03/17/2021, 1:23 PM

## 2021-03-17 NOTE — Progress Notes (Signed)
Subjective: Patient reports severe pain when tries to get up  Objective: Vital signs in last 24 hours: Temp:  [98.4 F (36.9 C)-98.7 F (37.1 C)] 98.5 F (36.9 C) (09/20 0816) Pulse Rate:  [64-91] 90 (09/20 0816) Resp:  [13-20] 20 (09/20 0816) BP: (146-181)/(78-91) 151/78 (09/20 0816) SpO2:  [96 %-100 %] 96 % (09/20 0816)  Intake/Output from previous day: 09/19 0701 - 09/20 0700 In: 363 [P.O.:360] Out: 1099 [Urine:1025; Drains:74] Intake/Output this shift: Total I/O In: -  Out: 200 [Urine:200]  Physical Exam: Complains of pain in back and electrical sensations into legs with movement.  Motor exam seems at baseline  Lab Results: Recent Labs    03/15/21 1127 03/17/21 0500  WBC 14.3* 10.5  HGB 9.5* 10.1*  HCT 30.4* 31.4*  PLT 406* 354   BMET Recent Labs    03/16/21 0459 03/17/21 0500  NA 132* 131*  K 3.9 3.6  CL 97* 91*  CO2 26 29  GLUCOSE 144* 135*  BUN 18 9  CREATININE 0.64 0.50*  CALCIUM 8.6* 9.1    Studies/Results: CT IMAGE GUIDED DRAINAGE BY PERCUTANEOUS CATHETER  Result Date: 03/16/2021 INDICATION: 70 year old male with history of recurrent left retroperitoneal fluid collection and increasing leukocytosis. EXAM: CT IMAGE GUIDED DRAINAGE BY PERCUTANEOUS CATHETER COMPARISON:  None. MEDICATIONS: The patient is currently admitted to the hospital and receiving intravenous antibiotics. The antibiotics were administered within an appropriate time frame prior to the initiation of the procedure. ANESTHESIA/SEDATION: Moderate (conscious) sedation was employed during this procedure. A total of Versed 2 mg and Fentanyl 100 mcg was administered intravenously. Moderate Sedation Time: 14 minutes. The patient's level of consciousness and vital signs were monitored continuously by radiology nursing throughout the procedure under my direct supervision. CONTRAST:  None COMPLICATIONS: None immediate. PROCEDURE: Informed written consent was obtained from the patient after a  discussion of the risks, benefits and alternatives to treatment. The patient was placed supine on the CT gantry and a pre procedural CT was performed re-demonstrating the known abscess/fluid collection within the left retroperitoneum. The procedure was planned. A timeout was performed prior to the initiation of the procedure. The left lower quadrant was prepped and draped in the usual sterile fashion. The overlying soft tissues were anesthetized with 1% lidocaine with epinephrine. Appropriate trajectory was planned with the use of a 22 gauge spinal needle. An 18 gauge trocar needle was advanced into the abscess/fluid collection and a short Amplatz super stiff wire was coiled within the collection. Appropriate positioning was confirmed with a limited CT scan. The tract was serially dilated allowing placement of a 10.2 [place] all-purpose drainage catheter. Appropriate positioning was confirmed with a limited postprocedural CT scan. Approximately 20 ml of serous fluid was aspirated. The tube was connected to a bulb suction and sutured in place. A dressing was placed. The patient tolerated the procedure well without immediate post procedural complication. IMPRESSION: Successful CT guided placement of a 10.2 [place] all purpose drain catheter into the left retroperitoneal fluid collection with aspiration of approximately 20 mL of serous fluid. Samples were sent to the laboratory as requested by the ordering clinical team. Marliss Coots, MD [facility] Electronically Signed   By: Marliss Coots M.D.   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 Conchita Paris.  I discussed situation with patient and his wife and also with Dr. Renold Don.  Per Dr. Renold Don, patient will need at least one week with broad  antibiotics before going back to surgery to stabilize his thoracic spine.  He will have limited mobility before this  is done.  PT should work with him in bed for now.    LOS: 2 days    [name], MD 03/17/2021, 9:34 AM

## 2021-03-17 NOTE — Progress Notes (Signed)
Physical Therapy Treatment Patient Details Name: Marco Kennedy MRN: [id number] DOB: [birth year] Today's Date: 03/17/2021   History of Present Illness Pt is a 70 y.o. 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 He is now s/p T9-T10 laminectomy for decompression of thecal sac and debridement of abscess 9/18. Drain placed in left retroperitoneal space on 03/17/19 on bulb suction. Pt had back sx  01/2020 with Dr. Venetia Maxon; admission 7/16-22 for sepsis and again 7/26 for SOB and fever. PMHx: back sx, arthritis, degenerative lumbar spinal stenosis, THA 2017.    PT Comments    Pt received in supine, agreeable to bed-level session (pain limited) and with good participation and tolerance for supine LE exercises for ROM/strengthening and log roll instruction. Pt needing multimodal cues for proper technique with log roll to L/R sides x2 reps ea and receptive to instruction on pressure relief strategies and importance of HOB elevated when eating/drinking to reduce risk of aspiration. Pt able to tolerate HOB up to 25 deg during session. Pt needs reinforcement for back precautions, noted to be twisting when attempting to roll on arrival of PTA to room. Pt continues to benefit from PT services to progress toward functional mobility goals.    Recommendations for follow up therapy are one component of a multi-disciplinary discharge planning process, led by the attending physician.  Recommendations may be updated based on patient status, additional functional criteria and insurance authorization.  Follow Up Recommendations  CIR;Supervision/Assistance - 24 hour     Equipment Recommendations  Other (comment) (TBD)    Recommendations for Other Services       Precautions / Restrictions Precautions Precautions: Fall;Back Precaution Booklet Issued: No Precaution Comments: bulb drain- abdomen, hemovac drain -spine; TLSO in room Required Braces or  Orthoses: Spinal Brace Spinal Brace: Thoracolumbosacral orthotic Restrictions Weight Bearing Restrictions: No     Mobility  Bed Mobility Overal bed mobility: Needs Assistance Bed Mobility: Rolling Rolling: Mod assist         General bed mobility comments: multimodal cues and assist to bend each LE prior to rolling; pt rolled to L/R sides x2 reps ea; pt with increased pain when HOB elevated >25 degrees so defer EOB/OOB, OK to defer OOB today per MD due to pain    Transfers                 General transfer comment: defer due to pain  Ambulation/Gait             General Gait Details: unable   Stairs             Wheelchair Mobility    Modified Rankin (Stroke Patients Only)       Balance                                            Cognition Arousal/Alertness: Awake/alert Behavior During Therapy: WFL for tasks assessed/performed;Anxious Overall Cognitive Status: Within Functional Limits for tasks assessed                                 General Comments: pt anxious and swift with movements, needs cues for slow, deliberate movements and pursed-lip breathing; A&O      Exercises Other Exercises Other Exercises: supine BLE AROM: ankle pumps, heel slides (AA),  hip abduction (AA for proper technique), SAQ, quad sets, glute sets x10 reps ea x 2 sets    General Comments General comments (skin integrity, edema, etc.): instruction on pressure relief strategies and frequency, pt may benefit from handout to reinforce pressure relief and HEP      Pertinent Vitals/Pain Pain Assessment: Faces Faces Pain Scale: Hurts whole lot Pain Location: lower back, abdomen Pain Descriptors / Indicators: Discomfort;Grimacing;Guarding;Sharp;Shooting;Moaning Pain Intervention(s): Limited activity within patient's tolerance;Monitored during session;Premedicated before session;Repositioned    Home Living                      Prior  Function            PT Goals (current goals can now be found in the care plan section) Acute Rehab PT Goals Patient Stated Goal: less pain PT Goal Formulation: With patient Time For Goal Achievement: 03/30/21 Potential to Achieve Goals: Fair Progress towards PT goals: Progressing toward goals (pain limiting progress today)    Frequency    Min 5X/week      PT Plan Current plan remains appropriate    Co-evaluation              AM-PAC PT "6 Clicks" Mobility   Outcome Measure  Help needed turning from your back to your side while in a flat bed without using bedrails?: A Lot Help needed moving from lying on your back to sitting on the side of a flat bed without using bedrails?: Total Help needed moving to and from a bed to a chair (including a wheelchair)?: Total Help needed standing up from a chair using your arms (e.g., wheelchair or bedside chair)?: Total Help needed to walk in hospital room?: Total Help needed climbing 3-5 steps with a railing? : Total 6 Click Score: 7    End of Session   Activity Tolerance: Patient limited by pain Patient left: in bed;with call bell/phone within reach;with bed alarm set;Other (comment);with SCD's reapplied (heels floated) Nurse Communication: Mobility status;Other (comment) (pt response to pain, encouraged log rolling/semi-sidelying every 2 hours) PT Visit Diagnosis: Other abnormalities of gait and mobility (R26.89);Pain     Time: 1715-1743 PT Time Calculation (min) (ACUTE ONLY): 28 min  Charges:  $Therapeutic Exercise: 8-22 mins $Therapeutic Activity: 8-22 mins                     Amna Welker P., PTA Acute Rehabilitation Services Pager: [phone] Office: [phone]    Angus Palms 03/17/2021, 6:06 PM

## 2021-03-17 NOTE — Progress Notes (Addendum)
HD#2 SUBJECTIVE:  Patient Summary: Marco Kennedy is a 70 y.o. with a pertinent PMH of chronic low back pain and hypertension, who presented for MIR follow-up and had worsening lower back pain and admitted for worsening epidural abscess.   Overnight Events: No acute events overnight.   Interim History: Patient endorses pain in his back and difficulty finding a comfortable position.  He feels his lower extremity numbness and tingling has worsened but his weakness (primarily in the LLE) is stable.  He denies loss of bowel or bladder.    OBJECTIVE:  Vital Signs: Vitals:   03/17/21 0330 03/17/21 0816 03/17/21 1226 03/17/21 1644  BP: (!) 146/83 (!) 151/78 (!) 156/84 (!) 149/80  Pulse: 91 90 88 87  Resp: 18 20 20 20   Temp: 98.4 F (36.9 C) 98.5 F (36.9 C) 98.6 F (37 C) 99.3 F (37.4 C)  TempSrc: Oral Oral Oral Oral  SpO2: 97% 96% 96% 99%  Weight:      Height:       Supplemental O2: Room Air SpO2: 99 % O2 Flow Rate (L/min): 2 L/min  Filed Weights   03/15/21 0014  Weight: 106.1 kg     Intake/Output Summary (Last 24 hours) at 03/17/2021 1651 Last data filed at 03/17/2021 1647 Gross per 24 hour  Intake 363 ml  Output 1019 ml  Net -656 ml    Net IO Since Admission: -1,321 mL [03/17/21 1651]  Physical Exam: Constitutional: Elderly gentleman sitting in bed.  No acute distress. Cardio: Regular rate and rhythm.  No murmurs, rubs, gallops. Pulm: Normal work of breathing.  Crackles noted bilaterally at bases, appreciated more on right side. Abdomen: Soft, nontender. L flank with bulb drain serous fluid Back: W/ drain scant sanguinous fluid  Neuro: Mental Status: Patient is awake, alert, oriented x3 No signs of aphasia or neglect Motor: Equal and normal effort thorughout, 5/5 bilateral flexion and extension around the knee joint, however patient minimally able to dorsiflex bilateral feet.  Weakness is noted with hip flexion, 3/5 strength Sensory: Sensation decreased from  ~T12 down. Otherwise normal  Psych: Normal mood and affect   Patient Lines/Drains/Airways Status     Active Line/Drains/Airways     Name Placement date Placement time Site Days   Peripheral IV 03/14/21 20 G 1.88" Anterior;Left;Proximal Forearm 03/14/21  1549  Forearm  2   PICC Single Lumen 01/15/21 Right Basilic 39 cm 0 cm 01/15/21  01/17/21  Basilic  60   Closed System Drain 1 Left Flank Bulb (JP) 12 Fr. 01/27/21  1407  Flank  48   Closed System Drain Superior;Midline Back Accordion (Hemovac) 10 Fr. 03/14/21  1933  Back  2   Incision (Closed) 04/18/20 Flank Left 04/18/20  1419  -- 332   Incision (Closed) 01/13/21 Back 01/13/21  1016  -- 62   Incision (Closed) 03/14/21 Back 03/14/21  2006  -- 2   Wound / Incision (Open or Dehisced) 01/29/21 Non-pressure wound;Other (Comment) Buttocks Left 01/29/21  0900  Buttocks  46             ASSESSMENT/PLAN:  Assessment: Active Problems:   Vertebral osteomyelitis (HCC)   Empyema (HCC)   Sepsis due to Escherichia coli (E. coli) (HCC)   Retroperitoneal fluid collection   Epidural abscess   Discitis of thoracic region   Plan: #Epidural abscess Patient underwent T9-T10 laminectomy for decompression of thecal sac and debridement of abscess on 9/17.  Of note he had a left retroperitoneal abscess drain placed on  01/28/2021 which was removed 02/24/21 after the abscess resolved.  Cultures from this operation show no growth to date.  Blood culture also showing no growth to date.  Patient continues to have significant pain.  -Neurosurgery on board, appreciate their recommendations  -Patient will need additional surgery to the thoracic spine once infection is cleared  -TLSO brace -Infectious disease on board, appreciate their recommendations  -Has been treated with vancomycin, cefepime x3 d  -Continue ertapenem  -IR on board, appreciate their assistance -Drain placed in left retroperitoneal space on 03/17/19.  On bulb suction.  Sample sent for culture.   -CT surgery consulted for possible intervention of loculated pleural effusion.  -Given interval decrease on imaging and lack of respiratory symptoms they did not feel intervention was appropriate at this time.    #Hypertension Chronic. On low dose amlodipine at home.  Will resume that this AM.   #Retroperitoneal fluid collection Seen on imaging done this admission. S/p IR aspiration and drain placement. Minimal serous fluid in drain. -Continue to monitor  Best Practice: Diet: N.p.o. IVF: Fluids: None VTE: SCDs Start: 03/14/21 1708 F/u with NSGY when okay to resume chemoprophylaxis  Code: Full AB: Ertapenem  Therapy Recs: CIR DISPO: Continue to monitor inpatient.  Signature: Marolyn Haller, MD PGY-2 Internal Medicine  Pager [phone]   Please contact the on call pager after 5 pm and on weekends at [phone].

## 2021-03-17 NOTE — Progress Notes (Signed)
Pt stated he doesn't want to wear CPAP for the night.  

## 2021-03-18 DIAGNOSIS — G062 Extradural and subdural abscess, unspecified: Secondary | ICD-10-CM | POA: Diagnosis not present

## 2021-03-18 DIAGNOSIS — M4644 Discitis, unspecified, thoracic region: Secondary | ICD-10-CM | POA: Diagnosis not present

## 2021-03-18 LAB — CBC WITH DIFFERENTIAL/PLATELET
Abs Immature Granulocytes: 0.05 10*3/uL (ref 0.00–0.07)
Basophils Absolute: 0.1 10*3/uL (ref 0.0–0.1)
Basophils Relative: 1 %
Eosinophils Absolute: 0.3 10*3/uL (ref 0.0–0.5)
Eosinophils Relative: 4 %
HCT: 32.1 % — ABNORMAL LOW (ref 39.0–52.0)
Hemoglobin: 10.4 g/dL — ABNORMAL LOW (ref 13.0–17.0)
Immature Granulocytes: 1 %
Lymphocytes Relative: 12 %
Lymphs Abs: 1.1 10*3/uL (ref 0.7–4.0)
MCH: 27.6 pg (ref 26.0–34.0)
MCHC: 32.4 g/dL (ref 30.0–36.0)
MCV: 85.1 fL (ref 80.0–100.0)
Monocytes Absolute: 1 10*3/uL (ref 0.1–1.0)
Monocytes Relative: 10 %
Neutro Abs: 6.9 10*3/uL (ref 1.7–7.7)
Neutrophils Relative %: 72 %
Platelets: 368 10*3/uL (ref 150–400)
RBC: 3.77 MIL/uL — ABNORMAL LOW (ref 4.22–5.81)
RDW: 14.3 % (ref 11.5–15.5)
WBC: 9.4 10*3/uL (ref 4.0–10.5)
nRBC: 0 % (ref 0.0–0.2)

## 2021-03-18 LAB — BASIC METABOLIC PANEL
Anion gap: 10 (ref 5–15)
BUN: 11 mg/dL (ref 8–23)
CO2: 30 mmol/L (ref 22–32)
Calcium: 9 mg/dL (ref 8.9–10.3)
Chloride: 94 mmol/L — ABNORMAL LOW (ref 98–111)
Creatinine, Ser: 0.6 mg/dL — ABNORMAL LOW (ref 0.61–1.24)
GFR, Estimated: >60 mL/min (ref >60)
Glucose, Bld: 126 mg/dL — ABNORMAL HIGH (ref 70–99)
Potassium: 4 mmol/L (ref 3.5–5.1)
Sodium: 134 mmol/L — ABNORMAL LOW (ref 135–145)

## 2021-03-18 NOTE — Progress Notes (Signed)
Subjective: Patient reports that overall he had a better night than previous nights. He appreciates the changes to his pain medications. He does report some constipation. Hemovc was removed accidentally overnight. His pain continues to be severe and is having episodes of sharp shooting pain into his BLE that is affecting ambulation.   Objective: Vital signs in last 24 hours: Temp:  [97.8 F (36.6 C)-99.3 F (37.4 C)] 97.8 F (36.6 C) (09/21 0512) Pulse Rate:  [87-91] 88 (09/21 0512) Resp:  [17-20] 18 (09/21 0512) BP: (116-156)/(74-91) 134/91 (09/21 0512) SpO2:  [94 %-99 %] 97 % (09/21 0512)  Intake/Output from previous day: 09/20 0701 - 09/21 0700 In: 537 [P.O.:237; IV Piggyback:300] Out: 1517.5 [Urine:1475; Drains:42.5] Intake/Output this shift: No intake/output data recorded.  Physical Exam: Patient is awake, A/O X 4, conversant, and in good spirits. They are in NAD and VSS. Speech is fluent and appropriate. MAEW with good strength. PERLA, EOMI. CNs grossly intact. Dressing is CDI. Hemovac removed overnight.    Lab Results: Recent Labs    03/17/21 0500 03/18/21 0500  WBC 10.5 9.4  HGB 10.1* 10.4*  HCT 31.4* 32.1*  PLT 354 368   BMET Recent Labs    03/17/21 0500 03/18/21 0500  NA 131* 134*  K 3.6 4.0  CL 91* 94*  CO2 29 30  GLUCOSE 135* 126*  BUN 9 11  CREATININE 0.50* 0.60*  CALCIUM 9.1 9.0    Studies/Results: CT IMAGE GUIDED DRAINAGE BY PERCUTANEOUS CATHETER  Result Date: 03/16/2021 INDICATION: 70 year old male with history of recurrent left retroperitoneal fluid collection and increasing leukocytosis. EXAM: CT IMAGE GUIDED DRAINAGE BY PERCUTANEOUS CATHETER COMPARISON:  None. MEDICATIONS: The patient is currently admitted to the hospital and receiving intravenous antibiotics. The antibiotics were administered within an appropriate time frame prior to the initiation of the procedure. ANESTHESIA/SEDATION: Moderate (conscious) sedation was employed during this  procedure. A total of Versed 2 mg and Fentanyl 100 mcg was administered intravenously. Moderate Sedation Time: 14 minutes. The patient's level of consciousness and vital signs were monitored continuously by radiology nursing throughout the procedure under my direct supervision. CONTRAST:  None COMPLICATIONS: None immediate. PROCEDURE: Informed written consent was obtained from the patient after a discussion of the risks, benefits and alternatives to treatment. The patient was placed supine on the CT gantry and a pre procedural CT was performed re-demonstrating the known abscess/fluid collection within the left retroperitoneum. The procedure was planned. A timeout was performed prior to the initiation of the procedure. The left lower quadrant was prepped and draped in the usual sterile fashion. The overlying soft tissues were anesthetized with 1% lidocaine with epinephrine. Appropriate trajectory was planned with the use of a 22 gauge spinal needle. An 18 gauge trocar needle was advanced into the abscess/fluid collection and a short Amplatz super stiff wire was coiled within the collection. Appropriate positioning was confirmed with a limited CT scan. The tract was serially dilated allowing placement of a 10.2 [place] all-purpose drainage catheter. Appropriate positioning was confirmed with a limited postprocedural CT scan. Approximately 20 ml of serous fluid was aspirated. The tube was connected to a bulb suction and sutured in place. A dressing was placed. The patient tolerated the procedure well without immediate post procedural complication. IMPRESSION: Successful CT guided placement of a 10.2 [place] all purpose drain catheter into the left retroperitoneal fluid collection with aspiration of approximately 20 mL of serous fluid. Samples were sent to the laboratory as requested by the ordering clinical team. Marliss Coots, MD [facility] Radiology Electronically Signed    By: Marliss Coots M.D.   On: 03/16/2021 14:59    Assessment/Plan: 70 y.o. male who is POD#3 s/p T9-10 laminectomy for SEA. The patient remains at his baseline neurological status. He is having intermittent exacerbations of low back pain that radiates into the anterior aspects of his BLE that typically correlates with movement and flexion at the waist. Mobility is decreased secondary to pain. 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 Plan for 1 week of antibiotics and then repeat MRI. IR placed perc drain for retroperitoneal collection yesterday. Continue supportive care.    PLAN: - Abx per ID - Continue working with PT/OT in the bed - Continue working on pain control - Encourage mobilization as tolerated - TLSO brace - MRI after 7 days of antibiotic - Will need additional surgery in his thoracic spine once infection has cleared.    LOS: 3 days     Council Mechanic, DNP, NP-C 03/18/2021, 9:19 AM

## 2021-03-18 NOTE — Progress Notes (Signed)
HD#3 SUBJECTIVE:  Patient Summary: Marco Kennedy is a 70 y.o. with a pertinent PMH of chronic low back pain and hypertension, who presented for MIR follow-up and had worsening lower back pain and admitted for worsening epidural abscess.   Overnight Events: Patient's Hemovac connection was disrupted.  The drain was pulled per neurosurgery as system had been contaminated.  Interim History: Patient reports continuation of pain today.  He was not wearing his TLSO brace at that time.  OBJECTIVE:  Vital Signs: Vitals:   03/17/21 2118 03/18/21 0008 03/18/21 0512 03/18/21 0835  BP: 122/74 116/77 (!) 134/91 127/79  Pulse: 91 88 88 85  Resp: 17 18 18 16   Temp: 99.2 F (37.3 C) 97.9 F (36.6 C) 97.8 F (36.6 C) 98.1 F (36.7 C)  TempSrc: Oral Oral Oral Oral  SpO2: 95% 94% 97% 97%  Weight:      Height:       Supplemental O2: Room Air SpO2: 97 % O2 Flow Rate (L/min): 2 L/min  Filed Weights   03/15/21 0014  Weight: 106.1 kg     Intake/Output Summary (Last 24 hours) at 03/18/2021 1531 Last data filed at 03/18/2021 0516 Gross per 24 hour  Intake 537 ml  Output 1097.5 ml  Net -560.5 ml   Net IO Since Admission: -1,556.5 mL [03/18/21 1531]  Physical Exam: Constitutional: Patient is resting in bed.  No acute distress noted. Cardio: Regular rate and rhythm.  No murmurs, rubs, gallops. Pulm: Clear to auscultation bilaterally.  Normal work of breathing. Abdomen: Soft, nondistended, nontender. MSK: SCDs over bilateral lower extremities. Neuro: Alert and oriented x3.  No acute deficit noted. Psych: Appropriate mood and affect.   Patient Lines/Drains/Airways Status     Active Line/Drains/Airways     Name Placement date Placement time Site Days   Peripheral IV 03/14/21 20 G 1.88" Anterior;Left;Proximal Forearm 03/14/21  1549  Forearm  4   PICC Single Lumen 01/15/21 Right Basilic 39 cm 0 cm 01/15/21  01/17/21  Basilic  62   Closed System Drain 1 Left Flank Bulb (JP) 12 Fr. 01/27/21   1407  Flank  50   Closed System Drain Lateral LLQ Bulb (JP) 10 Fr. 03/16/21  1335  LLQ  2   Incision (Closed) 04/18/20 Flank Left 04/18/20  1419  -- 334   Incision (Closed) 01/13/21 Back 01/13/21  1016  -- 64   Incision (Closed) 03/14/21 Back 03/14/21  2006  -- 4   Wound / Incision (Open or Dehisced) 01/29/21 Non-pressure wound;Other (Comment) Buttocks Left 01/29/21  0900  Buttocks  48             ASSESSMENT/PLAN:  Assessment: Active Problems:   Vertebral osteomyelitis (HCC)   Empyema (HCC)   Sepsis due to Escherichia coli (E. coli) (HCC)   Retroperitoneal fluid collection   Epidural abscess   Discitis of thoracic region   Plan: #Epidural abscess Patient underwent T9-T10 laminectomy for decompression of thecal sac and debridement of abscess on 9/17.  He had a left retroperitoneal abscess drain placed on 01/28/2021 which was removed on 02/24/2021 after the abscess resolved.  Fluid culture collected at that time as well as blood culture continue to show no growth to date.  He continues to endorse significant pain.  Overnight, his Hemovac connection was disrupted and the drain was pulled per neurosurgery as system had been contaminated. -Neurosurgery on board, appreciate their recommendations.  -Continue ertapenem 1 g daily -Patient is on bed rest with exception of mobilization to  bedside commode -TSLO brace at all times -Repeat MRI after 7 days of antibiotic -We will need repeat surgery to thoracic spine after infection is cleared  #Hypertension Chronically managed.  BP of 134/91 today. -Continue amlodipine 5 mg daily  #Retroperitoneal fluid collection Noted on imaging done at admission.  S/P IR aspiration and drain placement.  Minimal serous fluid in the drain. -Monitor  Best Practice: Diet: Regular diet IVF: Fluids: None VTE: SCDs Start: 03/14/21 1708 Code: Full AB: Ertapenem 1 g daily DISPO: Continue to monitor inpatient.  Signature: Champ Mungo, D.O.  Internal  Medicine Resident, PGY-1 [facility]  Pager: [phone] 3:31 PM, 03/18/2021   Please contact the on call pager after 5 pm and on weekends at [phone].

## 2021-03-18 NOTE — Plan of Care (Signed)

## 2021-03-18 NOTE — Progress Notes (Signed)
Inpatient Rehab Admissions Coordinator Note:   Per PT recommendations patient was screened for CIR candidacy by Stephania Fragmin, PT. At this time, pt appears to be a potential candidate for CIR. I will request an order for rehab consult for full assessment, per our protocol.  Please contact me any with questions.Estill Dooms, PT, DPT [phone] 03/18/21 1:38 PM

## 2021-03-18 NOTE — Progress Notes (Signed)
Pt's bottom bed sheet noted to have a serosanguineous hue drainage on sheet. This was noted to be drainage for the disconnection of his Hemovac connection (tube from back to suction). Pt's actual wound dressing was still dry, clean, and intact. Dr. Danielle Dess requested that drain be pulled, new dressing applied, and reinforced d/t system being contaminated.

## 2021-03-18 NOTE — Progress Notes (Signed)
Pt stated he doesn't want to wear CPAP tonight.  

## 2021-03-18 NOTE — Progress Notes (Signed)
Occupational Therapy Treatment Patient Details Name: Marco Kennedy MRN: [id number] DOB: 1951-04-14 Today's Date: 03/18/2021   History of present illness Pt is a 70 y.o. 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 He is now s/p T9-T10 laminectomy for decompression of thecal sac and debridement of abscess 9/18. Drain placed in left retroperitoneal space on 03/17/19 on bulb suction. Pt had back sx  01/2020 with Dr. Venetia Maxon; admission 7/16-22 for sepsis and again 7/26 for SOB and fever. PMHx: back sx, arthritis, degenerative lumbar spinal stenosis, THA 2017.   OT comments  Pt was seen in conjunction with PT to promote pt's activity tolerance and safety. Pt continues to be limited by severe pain with position changes and with the HOB elevated beyond 20*. He also requires moderate cueing to maintain back precautions during all mobility, and was observed attempting to twist several times. Overall pt required mod-max +2 for bed mobility, and total A +2 for 1x sit<>stand attempt, unable to clear his hips from the bed due to bilat LE buckling. Pt tolerated sitting EOB while completing table top grooming tasks with min guard for balance. He donned his back brace with max A with no report of increase pain. He continues to benefit from OT acutely. Recommend CIR at d/c to progress towards mod I baseline.    Recommendations for follow up therapy are one component of a multi-disciplinary discharge planning process, led by the attending physician.  Recommendations may be updated based on patient status, additional functional criteria and insurance authorization.    Follow Up Recommendations  CIR    Equipment Recommendations  None recommended by OT       Precautions / Restrictions Precautions Precautions: Fall;Back Precaution Booklet Issued: No Precaution Comments: bulb drain- abdomen, hemovac drain -spine (came out 9/20, not replaced); TLSO in  room Required Braces or Orthoses: Spinal Brace Spinal Brace: Thoracolumbosacral orthotic Restrictions Weight Bearing Restrictions: No       Mobility Bed Mobility Overal bed mobility: Needs Assistance Bed Mobility: Rolling Rolling: Mod assist Sidelying to sit: Max assist;+2 for physical assistance;+2 for safety/equipment     Sit to sidelying: Total assist;+2 for physical assistance General bed mobility comments: multimodal cues and assist to bend each LE prior to rolling; pt rolled to L/R sides; pt able to perform log roll transfer to EOB with support at BLE and trunk assist, heavy lift assist to reach upright; Posterior lean upon sitting up. TotalA to return to supine with +2 assist due to increased pain/fatigue and pt having difficulty scooting hips back into bed or toward [facility] after standing.    Transfers Overall transfer level: Needs assistance Equipment used: Rolling walker (2 wheeled) Transfers: Sit to/from Stand Sit to Stand: Total assist;From elevated surface         General transfer comment: pt unable to lift hips >1" off bed, unable to reach full upright posture and BLE buckling with attempt so returned to sitting; will likely do better with B HHA and knees blocked vs mechanical lift assist next session    Balance Overall balance assessment: Needs assistance Sitting-balance support: Bilateral upper extremity supported Sitting balance-Leahy Scale: Poor Sitting balance - Comments: posterior lean, worked on forward gaze/upright posture and chest up but pt maintains rounded shoulders back and impulsively leans back due to pain intermittently so needs variable min guard to minA for safety with weight shifting and dynamic seated tasks Postural control: Posterior lean   Standing balance-Leahy Scale: Zero  Standing balance comment: pt unable to reach upright posture with +2 assist on STS attempt; BLE buckling                           ADL either performed or  assessed with clinical judgement   ADL Overall ADL's : Needs assistance/impaired     Grooming: Min guard;Sitting Grooming Details (indicate cue type and reason): min guard for sitting balance during task         Upper Body Dressing : Maximal assistance Upper Body Dressing Details (indicate cue type and reason): max A for donning TLSO                 Functional mobility during ADLs: Maximal assistance (Max A for bed mobility, total A for sit<>stand 1x) General ADL Comments: pt completed grooming while sitting EOB this session and tolerated unsupported sitting balance for ~8 minutes     Vision   Vision Assessment?: No apparent visual deficits   Perception     Praxis      Cognition Arousal/Alertness: Awake/alert Behavior During Therapy: WFL for tasks assessed/performed;Anxious Overall Cognitive Status: Within Functional Limits for tasks assessed                                 General Comments: pt anxious and swift with movements, needs cues for slow, deliberate movements and pursed-lip breathing; A&O. Pt able to state 2/3 precautions but needs hints/further info on no lifting precaution.        Exercises Exercises: Other exercises Other Exercises Other Exercises: supine BLE AROM: ankle pumps, heel slides (AA) x10 reps ea Other Exercises: seated BLE AROM: LAQ, ankle pumps x10 reps ea Other Exercises: rolling to L/R LE/UE strengthening   Shoulder Instructions       General Comments reinforced back precautions during bed mobility    Pertinent Vitals/ Pain       Pain Assessment: Faces Faces Pain Scale: Hurts whole lot Pain Location: lower back, abdomen Pain Descriptors / Indicators: Discomfort;Grimacing;Guarding;Sharp;Shooting;Moaning Pain Intervention(s): Limited activity within patient's tolerance;Monitored during session  Home Living                                          Prior Functioning/Environment               Frequency  Min 2X/week        Progress Toward Goals  OT Goals(current goals can now be found in the care plan section)  Progress towards OT goals: Progressing toward goals  Acute Rehab OT Goals Patient Stated Goal: less pain OT Goal Formulation: With patient Time For Goal Achievement: 03/30/21 Potential to Achieve Goals: Fair ADL Goals Pt Will Perform Grooming: with supervision;standing Pt Will Perform Lower Body Bathing: with min assist;sit to/from stand Pt Will Perform Lower Body Dressing: with min assist;with adaptive equipment;sit to/from stand Pt Will Transfer to Toilet: with supervision;ambulating Pt Will Perform Toileting - Clothing Manipulation and hygiene: with modified independence;sitting/lateral leans  Plan Discharge plan needs to be updated    Co-evaluation      Reason for Co-Treatment: Complexity of the patient's impairments (multi-system involvement);Necessary to address cognition/behavior during functional activity;For patient/therapist safety;To address functional/ADL transfers PT goals addressed during session: Mobility/safety with mobility;Balance;Proper use of DME;Strengthening/ROM        AM-PAC  OT "6 Clicks" Daily Activity     Outcome Measure   Help from another person eating meals?: Total Help from another person taking care of personal grooming?: A Little Help from another person toileting, which includes using toliet, bedpan, or urinal?: A Lot Help from another person bathing (including washing, rinsing, drying)?: A Lot Help from another person to put on and taking off regular upper body clothing?: A Little Help from another person to put on and taking off regular lower body clothing?: A Lot 6 Click Score: 13    End of Session Equipment Utilized During Treatment: Gait belt;Back brace  OT Visit Diagnosis: Unsteadiness on feet (R26.81);Other abnormalities of gait and mobility (R26.89);Muscle weakness (generalized) (M62.81);Repeated falls  (R29.6);Pain   Activity Tolerance Patient limited by pain   Patient Left in bed;with call bell/phone within reach;with bed alarm set   Nurse Communication Mobility status;Patient requests pain meds;Precautions        Time: 8144-8185 OT Time Calculation (min): 28 min  Charges: OT General Charges $OT Visit: 1 Visit OT Treatments $Self Care/Home Management : 8-22 mins   Sachiko Methot A William Laske 03/18/2021, 3:19 PM

## 2021-03-18 NOTE — Progress Notes (Addendum)
Physical Therapy Treatment Patient Details Name: Marco Kennedy MRN: [id number] DOB: 1951-02-24 Today's Date: 03/18/2021   History of Present Illness Pt is a 70 y.o. 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 He is now s/p T9-T10 laminectomy for decompression of thecal sac and debridement of abscess 9/18. Drain placed in left retroperitoneal space on 03/17/19 on bulb suction. Pt had back sx  01/2020 with Dr. Venetia Maxon; admission 7/16-22 for sepsis and again 7/26 for SOB and fever. PMHx: back sx, arthritis, degenerative lumbar spinal stenosis, THA 2017.    PT Comments    Pt received in supine, agreeable to therapy session with encouragement and with good participation and fair tolerance for bed mobility, supine/seated LE exercises for strengthening and initial transfer training. Pt with some recall of back precautions when prompted but with poor compliance during bed mobility and needs mod cues throughout not to twist back when repositioning. Pt attempted stand from elevated bed height but BLE buckling with attempt and unable to lift hips significantly from bed surface. PT session coordinated with OT due to pt multidisciplinary therapy needs and decreased pain tolerance with emphasis on balance, LE strengthening and precaution compliance. Pt continues to benefit from PT services to progress toward functional mobility goals. Continue to recommend CIR.  Recommendations for follow up therapy are one component of a multi-disciplinary discharge planning process, led by the attending physician.  Recommendations may be updated based on patient status, additional functional criteria and insurance authorization.  Follow Up Recommendations  CIR;Supervision/Assistance - 24 hour     Equipment Recommendations  Other (comment) (TBD)    Recommendations for Other Services       Precautions / Restrictions Precautions Precautions: Fall;Back Precaution  Booklet Issued: No Precaution Comments: bulb drain- abdomen, hemovac drain -spine; TLSO in room Required Braces or Orthoses: Spinal Brace Spinal Brace: Thoracolumbosacral orthotic Restrictions Weight Bearing Restrictions: No     Mobility  Bed Mobility Overal bed mobility: Needs Assistance Bed Mobility: Rolling Rolling: Mod assist Sidelying to sit: Max assist;+2 for physical assistance;+2 for safety/equipment     Sit to sidelying: Total assist;+2 for physical assistance General bed mobility comments: multimodal cues and assist to bend each LE prior to rolling; pt rolled to L/R sides; pt able to perform log roll transfer to EOB with support at BLE and trunk assist, heavy lift assist to reach upright; Posterior lean upon sitting up. TotalA to return to supine with +2 assist due to increased pain/fatigue and pt having difficulty scooting hips back into bed or toward [facility] after standing.    Transfers Overall transfer level: Needs assistance Equipment used: Rolling walker (2 wheeled) Transfers: Sit to/from Stand Sit to Stand: Total assist;From elevated surface         General transfer comment: pt unable to lift hips >1" off bed, unable to reach full upright posture and BLE buckling with attempt so returned to sitting; will likely do better with B HHA and knees blocked vs mechanical lift assist next session  Ambulation/Gait             General Gait Details: unable      Balance Overall balance assessment: Needs assistance Sitting-balance support: Bilateral upper extremity supported Sitting balance-Leahy Scale: Poor Sitting balance - Comments: posterior lean, worked on forward gaze/upright posture and chest up but pt maintains rounded shoulders back and impulsively leans back due to pain intermittently so needs variable min guard to minA for safety with weight shifting  and dynamic seated tasks Postural control: Posterior lean   Standing balance-Leahy Scale: Zero Standing  balance comment: pt unable to reach upright posture with +2 assist on STS attempt; BLE buckling       Cognition Arousal/Alertness: Awake/alert Behavior During Therapy: WFL for tasks assessed/performed;Anxious Overall Cognitive Status: Within Functional Limits for tasks assessed               General Comments: pt anxious and swift with movements, needs cues for slow, deliberate movements and pursed-lip breathing; A&O. Pt able to state 2/3 precautions but needs hints/further info on no lifting precaution.      Exercises Other Exercises Other Exercises: supine BLE AROM: ankle pumps, heel slides (AA) x10 reps ea Other Exercises: seated BLE AROM: LAQ, ankle pumps x10 reps ea Other Exercises: rolling to L/R LE/UE strengthening    General Comments General comments (skin integrity, edema, etc.): reinforced back precs (handout given) and supine LE HEP as well as pressure relief strategies      Pertinent Vitals/Pain Pain Assessment: Faces Faces Pain Scale: Hurts whole lot Pain Location: lower back, abdomen Pain Descriptors / Indicators: Discomfort;Grimacing;Guarding;Sharp;Shooting;Moaning Pain Intervention(s): Limited activity within patient's tolerance;Monitored during session;Repositioned;Premedicated before session (PO pain meds 40 mins prior to session)           PT Goals (current goals can now be found in the care plan section) Acute Rehab PT Goals Patient Stated Goal: less pain PT Goal Formulation: With patient Time For Goal Achievement: 03/30/21 Potential to Achieve Goals: Fair Progress towards PT goals: Progressing toward goals    Frequency    Min 5X/week      PT Plan Current plan remains appropriate    AM-PAC PT "6 Clicks" Mobility   Outcome Measure  Help needed turning from your back to your side while in a flat bed without using bedrails?: A Lot Help needed moving from lying on your back to sitting on the side of a flat bed without using bedrails?:  Total Help needed moving to and from a bed to a chair (including a wheelchair)?: Total Help needed standing up from a chair using your arms (e.g., wheelchair or bedside chair)?: Total Help needed to walk in hospital room?: Total Help needed climbing 3-5 steps with a railing? : Total 6 Click Score: 7    End of Session Equipment Utilized During Treatment: Gait belt;Back brace Activity Tolerance: Patient limited by pain;Patient tolerated treatment well Patient left: in bed;with call bell/phone within reach;with bed alarm set;Other (comment);with SCD's reapplied (heels floated) Nurse Communication: Mobility status;Other (comment);Need for lift equipment (pt response to pain, encouraged log rolling/semi-sidelying every 2 hours, will need hoyer lift if transferring OOB) PT Visit Diagnosis: Other abnormalities of gait and mobility (R26.89);Pain     Time: 7510-2585 PT Time Calculation (min) (ACUTE ONLY): 29 min  Charges:  $Therapeutic Activity: 8-22 mins                     Tahtiana Rozier P., PTA Acute Rehabilitation Services Pager: [phone] Office: [phone]    Dorathy Kinsman Layson Bertsch 03/18/2021, 3:00 PM

## 2021-03-19 DIAGNOSIS — G062 Extradural and subdural abscess, unspecified: Secondary | ICD-10-CM | POA: Diagnosis not present

## 2021-03-19 DIAGNOSIS — M4620 Osteomyelitis of vertebra, site unspecified: Secondary | ICD-10-CM | POA: Diagnosis not present

## 2021-03-19 DIAGNOSIS — M4644 Discitis, unspecified, thoracic region: Secondary | ICD-10-CM | POA: Diagnosis not present

## 2021-03-19 DIAGNOSIS — R188 Other ascites: Secondary | ICD-10-CM | POA: Diagnosis not present

## 2021-03-19 LAB — CBC WITH DIFFERENTIAL/PLATELET
Abs Immature Granulocytes: 0.05 10*3/uL (ref 0.00–0.07)
Basophils Absolute: 0 10*3/uL (ref 0.0–0.1)
Basophils Relative: 1 %
Eosinophils Absolute: 0.3 10*3/uL (ref 0.0–0.5)
Eosinophils Relative: 4 %
HCT: 31.9 % — ABNORMAL LOW (ref 39.0–52.0)
Hemoglobin: 10.1 g/dL — ABNORMAL LOW (ref 13.0–17.0)
Immature Granulocytes: 1 %
Lymphocytes Relative: 11 %
Lymphs Abs: 1 10*3/uL (ref 0.7–4.0)
MCH: 27.3 pg (ref 26.0–34.0)
MCHC: 31.7 g/dL (ref 30.0–36.0)
MCV: 86.2 fL (ref 80.0–100.0)
Monocytes Absolute: 0.8 10*3/uL (ref 0.1–1.0)
Monocytes Relative: 9 %
Neutro Abs: 6.5 10*3/uL (ref 1.7–7.7)
Neutrophils Relative %: 74 %
Platelets: 404 10*3/uL — ABNORMAL HIGH (ref 150–400)
RBC: 3.7 MIL/uL — ABNORMAL LOW (ref 4.22–5.81)
RDW: 14.3 % (ref 11.5–15.5)
WBC: 8.7 10*3/uL (ref 4.0–10.5)
nRBC: 0 % (ref 0.0–0.2)

## 2021-03-19 LAB — AEROBIC/ANAEROBIC CULTURE W GRAM STAIN (SURGICAL/DEEP WOUND)
Culture: NO GROWTH
Gram Stain: NONE SEEN

## 2021-03-19 LAB — BASIC METABOLIC PANEL
Anion gap: 9 (ref 5–15)
BUN: 16 mg/dL (ref 8–23)
CO2: 31 mmol/L (ref 22–32)
Calcium: 8.9 mg/dL (ref 8.9–10.3)
Chloride: 94 mmol/L — ABNORMAL LOW (ref 98–111)
Creatinine, Ser: 0.7 mg/dL (ref 0.61–1.24)
GFR, Estimated: >60 mL/min (ref >60)
Glucose, Bld: 124 mg/dL — ABNORMAL HIGH (ref 70–99)
Potassium: 4 mmol/L (ref 3.5–5.1)
Sodium: 134 mmol/L — ABNORMAL LOW (ref 135–145)

## 2021-03-19 LAB — CULTURE, BLOOD (ROUTINE X 2)
Culture: NO GROWTH
Culture: NO GROWTH
Special Requests: ADEQUATE
Special Requests: ADEQUATE

## 2021-03-19 LAB — C-REACTIVE PROTEIN: CRP: 12 mg/dL — ABNORMAL HIGH (ref <1.0)

## 2021-03-19 LAB — MAGNESIUM: Magnesium: 1.9 mg/dL (ref 1.7–2.4)

## 2021-03-19 LAB — PHOSPHORUS: Phosphorus: 3.8 mg/dL (ref 2.5–4.6)

## 2021-03-19 MED ORDER — CEFAZOLIN SODIUM-DEXTROSE 2-4 GM/100ML-% IV SOLN
2.0000 g | Freq: Three times a day (TID) | INTRAVENOUS | Status: DC
  Administered 2021-03-19 – 2021-04-02 (×41): 2 g via INTRAVENOUS
  Filled 2021-03-19 (×46): qty 100

## 2021-03-19 NOTE — Progress Notes (Signed)
Subjective: Patient reports that he is managing well. He continues to have severe back pain and BLE pain that severely limits his mobility. He also continues to have c/o constipation. No acute events overnight.   Objective: Vital signs in last 24 hours: Temp:  [97.8 F (36.6 C)-99.2 F (37.3 C)] 97.8 F (36.6 C) (09/22 0349) Pulse Rate:  [85-95] 92 (09/22 0349) Resp:  [16-18] 18 (09/22 0349) BP: (120-139)/(74-80) 129/79 (09/22 0349) SpO2:  [93 %-97 %] 96 % (09/22 0349)  Intake/Output from previous day: 09/21 0701 - 09/22 0700 In: -  Out: 352 [Urine:350; Drains:2] Intake/Output this shift: No intake/output data recorded.  Physical Exam: Patient is awake, A/O X 4, conversant, and in good spirits. They are in NAD and VSS. Speech is fluent and appropriate. MAEW with good strength. PERLA, EOMI. CNs grossly intact. Dressing is CDI.   Lab Results: Recent Labs    03/18/21 0500 03/19/21 0514  WBC 9.4 8.7  HGB 10.4* 10.1*  HCT 32.1* 31.9*  PLT 368 404*   BMET Recent Labs    03/18/21 0500 03/19/21 0514  NA 134* 134*  K 4.0 4.0  CL 94* 94*  CO2 30 31  GLUCOSE 126* 124*  BUN 11 16  CREATININE 0.60* 0.70  CALCIUM 9.0 8.9    Studies/Results: No results found.  Assessment/Plan: 70 y.o. male who is POD#4 s/p T9-10 laminectomy for SEA. The patient remains at his baseline neurological status. Back pain and BLE pain continue to be severe and is significantly impacting his mobility. 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 Plan for 1 week of antibiotics and then repeat MRI. IR perc drain for retroperitoneal collection in place. Continue supportive care.    PLAN: - Abx per ID - Continue working with PT/OT in the bed - Continue working on pain control - Encourage mobilization as tolerated - TLSO brace - MRI after 7 days of antibiotic - Will need additional surgery in his thoracic  spine once infection has cleared.    LOS: 4 days     Council Mechanic, DNP, NP-C 03/19/2021, 8:02 AM

## 2021-03-19 NOTE — Progress Notes (Addendum)
Referring Physician(s): * No referring provider recorded for this case *  Supervising Physician: Simonne Come  Patient Status:  [facility] - In-pt  Chief Complaint:  Left retroperitoneal drain placement for recurrent epidural abscess on 03/16/2021 after it was noted on 03/14/2021 that the previous pelvic fluid site collection had increased in size.  Previous left retroperitoneal abscess drain placed 01/28/2021, removed 02/24/2021 after abscess resolution.   Subjective:  Patient resting in bed. He is alert and oriented, calm and pleasant.  Patient reports mild tenderness around drain site but denies pain.  Patient reports his only pain is his normal back pain that has not increased since he has been here.  He is in no distress.  Drain bulb has scant amount of serous fluid with only 2cc output documented in Epic over last 24 hours. Drain flushes easily. Drain site is C/D/I with no drainage, redness, bleeding, or other signs of infection. Pt c/o mild tenderness to site on palpation.      Allergies: Patient has no known allergies.  Medications: Prior to Admission medications   Medication Sig Start Date End Date Taking? Authorizing Provider  acetaminophen (TYLENOL) 325 MG tablet Take 2 tablets (650 mg total) by mouth every 6 (six) hours as needed for mild pain (or Fever >/= 101). 02/03/21  Yes Rizwan, Saima, MD  amLODipine (NORVASC) 5 MG tablet Take 1 tablet (5 mg total) by mouth daily. 02/16/21  Yes Angiulli, Mcarthur Rossetti, PA-C  ceFAZolin (ANCEF) 2-4 GM/100ML-% IVPB Inject 100 mLs (2 g total) into the vein every 8 (eight) hours. 02/16/21  Yes Angiulli, Mcarthur Rossetti, PA-C  cholecalciferol (VITAMIN D3) 25 MCG (1000 UNIT) tablet Take 1 tablet (1,000 Units total) by mouth daily. 02/16/21  Yes Angiulli, Mcarthur Rossetti, PA-C  diclofenac (VOLTAREN) 75 MG EC tablet Take 75 mg by mouth 2 (two) times daily. 02/13/21  Yes [provider]  docusate sodium (COLACE) 100 MG capsule Take 100 mg by mouth daily.   Yes  [provider]  Multiple Vitamin (MULTIVITAMIN WITH MINERALS) TABS tablet Take 1 tablet by mouth daily.   Yes [provider]  oxyCODONE (OXY IR/ROXICODONE) 5 MG immediate release tablet Take 5 mg by mouth every 6 (six) hours as needed for severe pain.   Yes [provider]  polyethylene glycol (MIRALAX / GLYCOLAX) 17 g packet Take 17 g by mouth 2 (two) times daily. Patient taking differently: Take 17 g by mouth daily as needed for mild constipation. 01/15/21  Yes Azucena Fallen, MD  colchicine 0.6 MG tablet Take 1 tablet (0.6 mg total) by mouth daily. Patient not taking: No sig reported 02/16/21   Charlton Amor, PA-C     Vital Signs: BP 133/86 (BP Location: Left Arm)   Pulse 80   Temp 97.7 F (36.5 C) (Oral)   Resp 18   Ht 5\' 7"  (1.702 m)   Wt 234 lb (106.1 kg)   SpO2 96%   BMI 36.65 kg/m   Physical Exam Constitutional:      General: He is not in acute distress.    Appearance: Normal appearance. He is not ill-appearing.  HENT:     Head: Normocephalic and atraumatic.  Cardiovascular:     Rate and Rhythm: Normal rate.  Pulmonary:     Effort: Pulmonary effort is normal.  Abdominal:     Tenderness: There is abdominal tenderness. There is no guarding.     Comments: Mild tenderness upon palpation to LLQ drain site  Musculoskeletal:  Right lower leg: No edema.     Left lower leg: No edema.  Skin:    General: Skin is warm and dry.  Neurological:     Mental Status: He is alert and oriented to person, place, and time. Mental status is at baseline.  Psychiatric:        Mood and Affect: Mood normal.        Behavior: Behavior normal.        Thought Content: Thought content normal.        Judgment: Judgment normal.    Imaging: CT IMAGE GUIDED DRAINAGE BY PERCUTANEOUS CATHETER  Result Date: 03/16/2021 INDICATION: 70 year old male with history of recurrent left retroperitoneal fluid collection and increasing leukocytosis. EXAM: CT IMAGE  GUIDED DRAINAGE BY PERCUTANEOUS CATHETER COMPARISON:  None. MEDICATIONS: The patient is currently admitted to the hospital and receiving intravenous antibiotics. The antibiotics were administered within an appropriate time frame prior to the initiation of the procedure. ANESTHESIA/SEDATION: Moderate (conscious) sedation was employed during this procedure. A total of Versed 2 mg and Fentanyl 100 mcg was administered intravenously. Moderate Sedation Time: 14 minutes. The patient's level of consciousness and vital signs were monitored continuously by radiology nursing throughout the procedure under my direct supervision. CONTRAST:  None COMPLICATIONS: None immediate. PROCEDURE: Informed written consent was obtained from the patient after a discussion of the risks, benefits and alternatives to treatment. The patient was placed supine on the CT gantry and a pre procedural CT was performed re-demonstrating the known abscess/fluid collection within the left retroperitoneum. The procedure was planned. A timeout was performed prior to the initiation of the procedure. The left lower quadrant was prepped and draped in the usual sterile fashion. The overlying soft tissues were anesthetized with 1% lidocaine with epinephrine. Appropriate trajectory was planned with the use of a 22 gauge spinal needle. An 18 gauge trocar needle was advanced into the abscess/fluid collection and a short Amplatz super stiff wire was coiled within the collection. Appropriate positioning was confirmed with a limited CT scan. The tract was serially dilated allowing placement of a 10.2 [place] all-purpose drainage catheter. Appropriate positioning was confirmed with a limited postprocedural CT scan. Approximately 20 ml of serous fluid was aspirated. The tube was connected to a bulb suction and sutured in place. A dressing was placed. The patient tolerated the procedure well without immediate post procedural complication. IMPRESSION: Successful CT guided  placement of a 10.2 [place] all purpose drain catheter into the left retroperitoneal fluid collection with aspiration of approximately 20 mL of serous fluid. Samples were sent to the laboratory as requested by the ordering clinical team. Marliss Coots, MD [facility] Electronically Signed   By: Marliss Coots M.D.   On: 03/16/2021 14:59    Labs:  CBC: Recent Labs    03/15/21 1127 03/17/21 0500 03/18/21 0500 03/19/21 0514  WBC 14.3* 10.5 9.4 8.7  HGB 9.5* 10.1* 10.4* 10.1*  HCT 30.4* 31.4* 32.1* 31.9*  PLT 406* 354 368 404*    COAGS: Recent Labs    01/31/21 0500 03/16/21 1124  INR 1.3* 1.2    BMP: Recent Labs    03/16/21 0459 03/17/21 0500 03/18/21 0500 03/19/21 0514  NA 132* 131* 134* 134*  K 3.9 3.6 4.0 4.0  CL 97* 91* 94* 94*  CO2 26 29 30 31   GLUCOSE 144* 135* 126* 124*  BUN 18 9 11 16   CALCIUM 8.6* 9.1 9.0 8.9  CREATININE 0.64 0.50* 0.60* 0.70  GFRNONAA >60 >  60 >60 >60    LIVER FUNCTION TESTS: Recent Labs    02/04/21 0425 02/06/21 0506 02/13/21 0439 03/14/21 1347  BILITOT 0.4 0.3 0.2* 0.5  AST 49* 47* 25 25  ALT 27 22 12 7   ALKPHOS 90 91 83 79  PROT 5.4* 5.5* 5.6* 7.6  ALBUMIN 1.7* 1.8* 2.0* 3.3*    Assessment and Plan:  Labs 03/19/2021:  -WBC WNL -Aerobic/anaerobic culture with Gram stain resulted rare WBCs present, predominantly mononuclear no organisms seen after 3 days. -Patient afebrile.   Plan per internal medicine is to repeat abdominal/pelvic CT with contrast on 9/24 and repeat MRI after 7 days of antibiotic therapy per note and pt.   IR to follow.   Electronically Signed: 10/24, NP 03/19/2021, 2:38 PM   I spent a total of 15 Minutes at the the patient's bedside AND on the patient's hospital floor or unit, greater than 50% of which was counseling/coordinating care for left retroperitoneal abscess drain.

## 2021-03-19 NOTE — Progress Notes (Signed)
[facility]  Date of Admission:  03/14/2021     Lines:  7/21-c Right ue picc      Abx: 9/22-c cefazolin  9/19-22 ertapenem 9/17-19 vanc 9/17-19 cefepime   7/21-9/17 cefazolin (outpatient/previous admission)     Assessment/Plan 70 yo male previous thoracolumbar hardware/back surgery 03/2020 complicated by thoracic vertebral om/epidural abscess s/p recent I&D 7/19 with cx ecoli (bcx negative at that time), with subsequent bilateral empyema (fluid cx negative) and intraabd fluid collection (cx negative 8/02). Has had complex course requiring bilateral chest tube and abd collection drain (cx negative). The abd drain was removed 8/30 after 8/20 repeat ct showed improving abscess size. He was readmitted now 9/17 after worsening LE pain and also mri repeat 9/16 showing worsening thoracic epidural process along with increasing abd fluid collection.        #vertebral OM/epidural abscess -- hardware related #retroperitoneal fluid collection s/p drain removal 8/30 #exudative bilateral pleural effusion/empyema s/p bilateral chest tubes Initial epidural abscess I&D 7/19; cx ecoli Pleural fluid cx negative and abd abscess cx negative 7/27 tte no evidence endocarditis 9/16 mri thoracic spine worsening thoracic epidural process; also clinically worsening LE pain, s/p I&D 9/17 9/17 operative cx ngtd 9/17 admission bcx ngtd   In terms of microbiologic diagnosis, suspect this is hardware related issue being barrier to clearance of infection. In the lesser probability of abx lackign coverage, would worry more about resistance development rather than new organisms. Will switch abx to ertapenem pending cx result   Agree with new perc drain into abd abscess per IR  -------------- 9/22 assessment Abd abscess fluid and spinal surgical wound cx are negative, confirming that this is a burden of disease/source control issue in setting of metals/hardware. He had had  2 surgical procedures by now to debride his back so hopefully that will help  The abd abscess drain is not having much output the last 24 hours, query if it had drained the abscess well  Will repeat abd ct this weekend to assess Will discuss with NSG if they want to repeat spine imaging (defer to them) prior to placing new hardware next week. Timing of new hardware placement recommendation is not evidence based, but after 1-2 weeks of abx treatment without worsening of disease (imaging, inflammatory marker, how he feels) -- I would start the clock from the time of his last surgery 9/17  9/20 crp is 15; next due today  -stop ertapenem; restart cefazolin 2 gram iv q8hours -will discuss with nsg if they want to scan the spine (thoracic and lumbar both) -will repeat abd/pelv ct with contrast this Saturday -will follow up Saturday or Sunday  -q48hours inflammatory markers trend  I spent more than 35 minute reviewing data/chart, and coordinating care and >50% direct face to face time providing counseling/discussing diagnostics/treatment plan with patient     Active Problems:   Vertebral osteomyelitis (HCC)   Empyema (HCC)   Sepsis due to Escherichia coli (E. coli) (HCC)   Retroperitoneal fluid collection   Epidural abscess   Discitis of thoracic region   No Known Allergies  Scheduled Meds:  acetaminophen  650 mg Oral Q6H   amLODipine  5 mg Oral Daily   Chlorhexidine Gluconate Cloth  6 each Topical Daily   polyethylene glycol  17 g Oral Daily   senna-docusate  1 tablet Oral Daily   sodium chloride flush  5 mL Intracatheter Q8H   Continuous Infusions:   ceFAZolin (ANCEF)  IV     PRN Meds:.fentaNYL, HYDROmorphone (DILAUDID) injection, midazolam, oxyCODONE   SUBJECTIVE: Numbness stable bilateral LE No f/c No n/v/diarrhea No rash Back pain managable  Thoracic surgical drain accidentally dislodged Perc-drain intraabd abscess not putting out much the last 24 hours  Review of  Systems: ROS All other ROS was negative, except mentioned above     OBJECTIVE: Vitals:   03/18/21 2349 03/19/21 0349 03/19/21 0845 03/19/21 1138  BP: 131/76 129/79 140/80 133/86  Pulse: 95 92 94 80  Resp: 17 18 18 18   Temp: 98.4 F (36.9 C) 97.8 F (36.6 C) 97.9 F (36.6 C) 97.7 F (36.5 C)  TempSrc: Oral Oral Oral Oral  SpO2: 95% 96% 96% 96%  Weight:      Height:       Body mass index is 36.65 kg/m.  Physical Exam  General/constitutional: no distress, pleasant HEENT: Normocephalic, PER, Conj Clear, EOMI, Oropharynx clear Neck supple CV: rrr no mrg Lungs: clear to auscultation, normal respiratory effort Abd: Soft, Nontender; perc drain intact, minimal sedimentous output Ext: no edema Skin: No Rash Neuro: nonfocal; LE strength intact MSK: back surgical site no erythema/tenderness/fluctuance; surgical honeycomb dressing in place and clean     Lab Results Lab Results  Component Value Date   WBC 8.7 03/19/2021   HGB 10.1 (L) 03/19/2021   HCT 31.9 (L) 03/19/2021   MCV 86.2 03/19/2021   PLT 404 (H) 03/19/2021    Lab Results  Component Value Date   CREATININE 0.70 03/19/2021   BUN 16 03/19/2021   NA 134 (L) 03/19/2021   K 4.0 03/19/2021   CL 94 (L) 03/19/2021   CO2 31 03/19/2021    Lab Results  Component Value Date   ALT 7 03/14/2021   AST 25 03/14/2021   ALKPHOS 79 03/14/2021   BILITOT 0.5 03/14/2021      Microbiology: Recent Results (from the past 240 hour(s))  Blood culture (routine x 2)     Status: None   Collection Time: 03/14/21  3:54 PM   Specimen: BLOOD  Result Value Ref Range Status   Specimen Description BLOOD SITE NOT SPECIFIED  Final   Special Requests   Final    BOTTLES DRAWN AEROBIC AND ANAEROBIC Blood Culture adequate volume   Culture   Final    NO GROWTH 5 DAYS Performed at [facility], [address], [place], [place]    Report Status 03/19/2021 FINAL  Final  Blood culture (routine x 2)     Status: None    Collection Time: 03/14/21  3:54 PM   Specimen: BLOOD  Result Value Ref Range Status   Specimen Description BLOOD LEFT ANTECUBITAL  Final   Special Requests   Final    BOTTLES DRAWN AEROBIC AND ANAEROBIC Blood Culture adequate volume   Culture   Final    NO GROWTH 5 DAYS Performed at [facility], [address] [place], [place]    Report Status 03/19/2021 FINAL  Final  Resp Panel by RT-PCR (Flu A&B, Covid) Nasopharyngeal Swab     Status: None   Collection Time: 03/14/21  4:33 PM   Specimen: Nasopharyngeal Swab; Nasopharyngeal(NP) swabs in vial transport medium  Result Value Ref Range Status   SARS Coronavirus 2 by RT PCR NEGATIVE NEGATIVE Final    Comment: (NOTE) SARS-CoV-2 target nucleic acids are NOT DETECTED.  The SARS-CoV-2 RNA is generally detectable in upper respiratory specimens during the acute phase of infection. The lowest concentration  of SARS-CoV-2 viral copies this assay can detect is 138 copies/mL. A negative result does not preclude SARS-Cov-2 infection and should not be used as the sole basis for treatment or other patient management decisions. A negative result may occur with  improper specimen collection/handling, submission of specimen other than nasopharyngeal swab, presence of viral mutation(s) within the areas targeted by this assay, and inadequate number of viral copies(<138 copies/mL). A negative result must be combined with clinical observations, patient history, and epidemiological information. The expected result is Negative.  Fact Sheet for Patients:  BloggerCourse.com  Fact Sheet for Healthcare Providers:  SeriousBroker.it  This test is no t yet approved or cleared by the [place] FDA and  has been authorized for detection and/or diagnosis of SARS-CoV-2 by FDA under an Emergency Use Authorization (EUA). This EUA will remain  in effect (meaning this test can be used) for the  duration of the COVID-19 declaration under Section 564(b)(1) of the Act, 21 U.S.C.section 360bbb-3(b)(1), unless the authorization is terminated  or revoked sooner.       Influenza A by PCR NEGATIVE NEGATIVE Final   Influenza B by PCR NEGATIVE NEGATIVE Final    Comment: (NOTE) The Xpert Xpress SARS-CoV-2/FLU/RSV plus assay is intended as an aid in the diagnosis of influenza from Nasopharyngeal swab specimens and should not be used as a sole basis for treatment. Nasal washings and aspirates are unacceptable for Xpert Xpress SARS-CoV-2/FLU/RSV testing.  Fact Sheet for Patients: BloggerCourse.com  Fact Sheet for Healthcare Providers: SeriousBroker.it  This test is not yet approved or cleared by the [place] FDA and has been authorized for detection and/or diagnosis of SARS-CoV-2 by FDA under an Emergency Use Authorization (EUA). This EUA will remain in effect (meaning this test can be used) for the duration of the COVID-19 declaration under Section 564(b)(1) of the Act, 21 U.S.C. section 360bbb-3(b)(1), unless the authorization is terminated or revoked.  Performed at [facility], [address] [place], [place] [address]   Aerobic/Anaerobic Culture w Gram Stain (surgical/deep wound)     Status: None   Collection Time: 03/14/21  7:33 PM   Specimen: PATH Other; Tissue  Result Value Ref Range Status   Specimen Description ABSCESS  Final   Special Requests THORACIC 9 AND 10  Final   Gram Stain NO WBC SEEN NO ORGANISMS SEEN   Final   Culture   Final    No growth aerobically or anaerobically. Performed at [facility], [address], [place], [place] [address]    Report Status 03/19/2021 FINAL  Final  Aerobic/Anaerobic Culture w Gram Stain (surgical/deep wound)     Status: None (Preliminary result)   Collection Time: 03/16/21  1:57 PM   Specimen: Abscess  Result Value Ref Range Status   Specimen Description  ABSCESS  Final   Special Requests ABDOMEN  Final   Gram Stain   Final    RARE WBC PRESENT, PREDOMINANTLY MONONUCLEAR NO ORGANISMS SEEN    Culture   Final    NO GROWTH 3 DAYS NO ANAEROBES ISOLATED; CULTURE IN PROGRESS FOR 5 DAYS Performed at [facility], [address], [place], [place] [address]    Report Status PENDING  Incomplete     Serology:   Imaging: If present, new imagings (plain films, ct scans, and mri) have been personally visualized and interpreted; radiology reports have been reviewed. Decision making incorporated into the Impression / Recommendations.  9/17 abd pelv ct 1. Findings compatible with patient's  known osteomyelitis/discitis at T9-T10 and T10-T11. There is stable lucency surrounding the T10 pedicle screws likely secondary to infection. There is paravertebral soft tissue thickening without discrete fluid collection. 2. Left pelvic fluid collection has increased in size. The drain has been removed in the interval. Correlate clinically for abscess. 3. Loculated pleural fluid collection in the right lower hemithorax has slightly decreased in size, but has not resolved. Findings are likely related to patient's known empyema.  9/17 mri thoracic spine 1. Worsening findings of discitis-osteomyelitis at T9-10 and T10-11 with enlargement of the epidural abscess at these levels resulting in severe spinal stenosis at T10-11. No cord edema. 2. Persistent extensive paravertebral inflammation/phlegmon. Decreased size of loculated bilateral pleural collections.     Raymondo Band, MD [facility] [phone] pager    03/19/2021, 1:02 PM

## 2021-03-19 NOTE — Plan of Care (Signed)

## 2021-03-19 NOTE — Progress Notes (Signed)
Physical Therapy Treatment Patient Details Name: Marco Kennedy MRN: [id number] DOB: 1951/04/03 Today's Date: 03/19/2021   History of Present Illness Pt is a 70 y.o. 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 He is now s/p T9-T10 laminectomy for decompression of thecal sac and debridement of abscess 9/18. Drain placed in left retroperitoneal space on 03/17/19 on bulb suction. Pt had back sx  01/2020 with Dr. Venetia Maxon; admission 7/16-22 for sepsis and again 7/26 for SOB and fever. PMHx: back sx, arthritis, degenerative lumbar spinal stenosis, THA 2017.    PT Comments    Pt received in supine, agreeable to therapy session and with fair participation and tolerance for bed-level exercises and repositioning for pressure relief. Pt not agreeable to don TLSO brace in supine/with rolling and performed partial roll for repositioning, needing modA and max cues for proper technique. Defer EOB/OOB due to pt pain and unwilling to don brace. Pt continues to benefit from PT services to progress toward functional mobility goals. Pt may consider having family bring shirt in for comfort to wear between brace and skin instead of hospital gown?  Recommendations for follow up therapy are one component of a multi-disciplinary discharge planning process, led by the attending physician.  Recommendations may be updated based on patient status, additional functional criteria and insurance authorization.  Follow Up Recommendations  CIR;Supervision/Assistance - 24 hour     Equipment Recommendations  Other (comment) (TBD)    Recommendations for Other Services Rehab consult     Precautions / Restrictions Precautions Precautions: Fall;Back Precaution Booklet Issued: Yes (comment) Precaution Comments: bulb drain- abdomen, TLSO in room Required Braces or Orthoses: Spinal Brace Spinal Brace: Thoracolumbosacral orthotic;Other (comment) Spinal Brace Comments: per  hospitalist note, recommend TLSO in bed but pt refusing Restrictions Weight Bearing Restrictions: No     Mobility  Bed Mobility Overal bed mobility: Needs Assistance Bed Mobility: Rolling Rolling: Mod assist         General bed mobility comments: multimodal cues and assist to bend each LE prior to rolling; pt rolled to L side multiple reps for repositioning/pressure relief but unable to perform properly unassisted due to difficulty with R hip extension/pushing into bed while cross-body reaching; defer EOB due to pt increased pain with rolling and refusing to don TLSO in supine    Transfers             General transfer comment: defer, no +2 assist available  Ambulation/Gait     General Gait Details: unable         Cognition Arousal/Alertness: Awake/alert Behavior During Therapy: [facility] for tasks assessed/performed;Anxious Overall Cognitive Status: Within Functional Limits for tasks assessed               General Comments: pt with some LE ataxia/uncontrolled movements and frequent guarding/abdominal bracing, needs cues for slow, deliberate movements and pursed-lip breathing; A&O. Pt able to state precautions but difficulty complying with "no twisting" precaution, handout in room to reinforce      Exercises Other Exercises Other Exercises: supine BLE AROM: ankle pumps, heel slides (AA), hip abduction (AA), SAQ, SLR (AA) x10 reps ea    General Comments General comments (skin integrity, edema, etc.): reinforced back precautions; encouraged TLSO use in supine per MD orders but pt refusing      Pertinent Vitals/Pain Pain Assessment: 0-10 Faces Pain Scale: Hurts even more Pain Location: lower back, abdomen Pain Descriptors / Indicators: Discomfort;Grimacing;Guarding;Sharp;Shooting;Moaning Pain Intervention(s): Limited activity within patient's tolerance;Monitored  during session;Repositioned;Patient requesting pain meds-RN notified     PT Goals (current goals can  now be found in the care plan section) Acute Rehab PT Goals Patient Stated Goal: less pain PT Goal Formulation: With patient Time For Goal Achievement: 03/30/21 Progress towards PT goals: Progressing toward goals    Frequency    Min 5X/week      PT Plan Current plan remains appropriate       AM-PAC PT "6 Clicks" Mobility   Outcome Measure  Help needed turning from your back to your side while in a flat bed without using bedrails?: A Lot Help needed moving from lying on your back to sitting on the side of a flat bed without using bedrails?: Total Help needed moving to and from a bed to a chair (including a wheelchair)?: Total Help needed standing up from a chair using your arms (e.g., wheelchair or bedside chair)?: Total Help needed to walk in hospital room?: Total Help needed climbing 3-5 steps with a railing? : Total 6 Click Score: 7    End of Session Equipment Utilized During Treatment: Gait belt;Other (comment) (pt refusing to roll to don TLSO in supine) Activity Tolerance: Patient limited by pain Patient left: in bed;with call bell/phone within reach;with bed alarm set;with family/visitor present;with SCD's reapplied (heels floated, semi-sidelying to L) Nurse Communication: Mobility status;Need for lift equipment;Other (comment) (will need hoyer lift for OOB) PT Visit Diagnosis: Other abnormalities of gait and mobility (R26.89);Pain Pain - part of body:  (back/abdomen)     Time: 5852-7782 PT Time Calculation (min) (ACUTE ONLY): 33 min  Charges:  $Therapeutic Exercise: 8-22 mins $Therapeutic Activity: 8-22 mins                     Chelsea Pedretti P., PTA Acute Rehabilitation Services Pager: [phone] Office: [phone]    [place] 03/19/2021, 6:40 PM

## 2021-03-19 NOTE — Progress Notes (Signed)
HD#4 SUBJECTIVE:  Patient Summary: Marco Kennedy is a 70 y.o. with a pertinent PMH of chronic low back pain and hypertension, who presented for MIR follow-up and had worsening lower back pain and admitted for worsening epidural abscess.   Overnight Events: None  Interim History: Patient continues to endorse pain today.  He states that he normal, jellylike feeling to bilateral lower extremities is no better however he is unable to tell if it is worse.  He is not wearing his TSLO brace in bed.  OBJECTIVE:  Vital Signs: Vitals:   03/18/21 2349 03/19/21 0349 03/19/21 0845 03/19/21 1138  BP: 131/76 129/79 140/80 133/86  Pulse: 95 92 94 80  Resp: 17 18 18 18   Temp: 98.4 F (36.9 C) 97.8 F (36.6 C) 97.9 F (36.6 C) 97.7 F (36.5 C)  TempSrc: Oral Oral Oral Oral  SpO2: 95% 96% 96% 96%  Weight:      Height:       Supplemental O2: Room Air SpO2: 96 % O2 Flow Rate (L/min): 2 L/min  Filed Weights   03/15/21 0014  Weight: 106.1 kg     Intake/Output Summary (Last 24 hours) at 03/19/2021 1428 Last data filed at 03/18/2021 2000 Gross per 24 hour  Intake --  Output 202 ml  Net -202 ml   Net IO Since Admission: -1,908.5 mL [03/19/21 1428]  Physical Exam: Constitutional: Patient is resting in bed.  No acute distress noted. Cardio: Regular rate and rhythm.  No murmurs, rubs, gallops. Pulm: Clear to auscultation bilaterally.  Normal work of breathing. Abdomen: Soft, nontender, nondistended. MSK: SCDs over bilateral lower extremities.  Wound dressing over upper thoracic spine. Skin: Warm and dry. Neuro: Alert oriented x3.  No focal deficit noted. Psych: Appropriate mood and affect.  Patient Lines/Drains/Airways Status     Active Line/Drains/Airways     Name Placement date Placement time Site Days   Peripheral IV 03/14/21 20 G 1.88" Anterior;Left;Proximal Forearm 03/14/21  1549  Forearm  5   PICC Single Lumen 01/15/21 Right Basilic 39 cm 0 cm 01/15/21  01/17/21  Basilic  63    Closed System Drain 1 Left Flank Bulb (JP) 12 Fr. 01/27/21  1407  Flank  51   Closed System Drain Lateral LLQ Bulb (JP) 10 Fr. 03/16/21  1335  LLQ  3   Incision (Closed) 04/18/20 Flank Left 04/18/20  1419  -- 335   Incision (Closed) 01/13/21 Back 01/13/21  1016  -- 65   Incision (Closed) 03/14/21 Back 03/14/21  2006  -- 5   Wound / Incision (Open or Dehisced) 01/29/21 Non-pressure wound;Other (Comment) Buttocks Left 01/29/21  0900  Buttocks  49             ASSESSMENT/PLAN:  Assessment: Active Problems:   Vertebral osteomyelitis (HCC)   Empyema (HCC)   Sepsis due to Escherichia coli (E. coli) (HCC)   Retroperitoneal fluid collection   Epidural abscess   Discitis of thoracic region   Plan: #Epidural abscess Fluid culture collected 9/19 shows no growth at 3 days and blood culture continue to shows no growth at 5 days.  He continues to endorse significant pain.   -Neurosurgery on board, appreciate their recommendations. -Patient is on bed rest with exception of mobilization to bedside commode -TSLO brace at all times -Repeat MRI after 7 days of antibiotic -We will need repeat surgery to thoracic spine after infection is cleared -ID on board, appreciate their recommendations -Per ID, timing of new hardware placement is after  1 to 2 weeks of antibiotic treatment without worsening of disease, starting from date of last surgery on 9/17 Repeat abdominal/pelvic CT with contrast Saturday 9/24             -Continue cefazolin 2 g IV every 8 hours -Trend inflammatory markers every 48 hours  #Hypertension Chronically managed.  BP of 129/79 today. -Continue amlodipine 5 mg daily   #Retroperitoneal fluid collection Noted on imaging done at admission.  S/P IR aspiration and drain placement.  Minimal serous fluid in the drain. -Monitor  Best Practice: Diet: Regular diet IVF: Fluids: None VTE: SCDs Start: 03/14/21 1708 Code: Full AB: Cefazolin 2 g IV every 8 hours DISPO: Continue to  monitor inpatient.  Signature: Champ Mungo, D.O.  Internal Medicine Resident, PGY-1 [facility]  Pager: [phone] 2:28 PM, 03/19/2021   Please contact the on call pager after 5 pm and on weekends at [phone].

## 2021-03-20 DIAGNOSIS — M4644 Discitis, unspecified, thoracic region: Secondary | ICD-10-CM | POA: Diagnosis not present

## 2021-03-20 DIAGNOSIS — R188 Other ascites: Secondary | ICD-10-CM | POA: Diagnosis not present

## 2021-03-20 DIAGNOSIS — G062 Extradural and subdural abscess, unspecified: Secondary | ICD-10-CM | POA: Diagnosis not present

## 2021-03-20 LAB — CBC WITH DIFFERENTIAL/PLATELET
Abs Immature Granulocytes: 0.03 10*3/uL (ref 0.00–0.07)
Basophils Absolute: 0.1 10*3/uL (ref 0.0–0.1)
Basophils Relative: 1 %
Eosinophils Absolute: 0.4 10*3/uL (ref 0.0–0.5)
Eosinophils Relative: 5 %
HCT: 32.5 % — ABNORMAL LOW (ref 39.0–52.0)
Hemoglobin: 10.4 g/dL — ABNORMAL LOW (ref 13.0–17.0)
Immature Granulocytes: 0 %
Lymphocytes Relative: 12 %
Lymphs Abs: 1 10*3/uL (ref 0.7–4.0)
MCH: 27.6 pg (ref 26.0–34.0)
MCHC: 32 g/dL (ref 30.0–36.0)
MCV: 86.2 fL (ref 80.0–100.0)
Monocytes Absolute: 0.9 10*3/uL (ref 0.1–1.0)
Monocytes Relative: 11 %
Neutro Abs: 5.7 10*3/uL (ref 1.7–7.7)
Neutrophils Relative %: 71 %
Platelets: 411 10*3/uL — ABNORMAL HIGH (ref 150–400)
RBC: 3.77 MIL/uL — ABNORMAL LOW (ref 4.22–5.81)
RDW: 14.2 % (ref 11.5–15.5)
WBC: 8 10*3/uL (ref 4.0–10.5)
nRBC: 0 % (ref 0.0–0.2)

## 2021-03-20 LAB — BASIC METABOLIC PANEL
Anion gap: 9 (ref 5–15)
BUN: 15 mg/dL (ref 8–23)
CO2: 30 mmol/L (ref 22–32)
Calcium: 8.7 mg/dL — ABNORMAL LOW (ref 8.9–10.3)
Chloride: 94 mmol/L — ABNORMAL LOW (ref 98–111)
Creatinine, Ser: 0.61 mg/dL (ref 0.61–1.24)
GFR, Estimated: >60 mL/min (ref >60)
Glucose, Bld: 127 mg/dL — ABNORMAL HIGH (ref 70–99)
Potassium: 3.9 mmol/L (ref 3.5–5.1)
Sodium: 133 mmol/L — ABNORMAL LOW (ref 135–145)

## 2021-03-20 MED ORDER — SENNOSIDES-DOCUSATE SODIUM 8.6-50 MG PO TABS
2.0000 | ORAL_TABLET | Freq: Every day | ORAL | Status: DC
  Administered 2021-03-20 – 2021-03-29 (×10): 2 via ORAL
  Filled 2021-03-20 (×10): qty 2

## 2021-03-20 MED ORDER — ENOXAPARIN SODIUM 40 MG/0.4ML IJ SOSY
40.0000 mg | PREFILLED_SYRINGE | INTRAMUSCULAR | Status: DC
  Administered 2021-03-20 – 2021-03-30 (×11): 40 mg via SUBCUTANEOUS
  Filled 2021-03-20 (×11): qty 0.4

## 2021-03-20 MED ORDER — MAGNESIUM HYDROXIDE 400 MG/5ML PO SUSP
15.0000 mL | Freq: Once | ORAL | Status: AC
  Administered 2021-03-20: 15 mL via ORAL
  Filled 2021-03-20: qty 30

## 2021-03-20 NOTE — Progress Notes (Signed)
HD#5 SUBJECTIVE:  Patient Summary: Marco Kennedy is a 70 y.o. with a pertinent PMH of chronic low back pain and hypertension, who presented for MIR follow-up and had worsening lower back pain and admitted for epidural abscess.   Overnight Events: None  Interim History: Patient reports feeling okay overall, he still is having issues with pain.  He reports no change that he can tell to the jelly-like feeling in his lower extremities.  He does report that he has yet to have a bowel movement and is receptive to advancing his bowel regimen.  OBJECTIVE:  Vital Signs: Vitals:   03/19/21 2355 03/20/21 0441 03/20/21 0938 03/20/21 1229  BP: 120/74 130/83 (!) 142/87 (!) 143/86  Pulse: 88 84 85 82  Resp: 18 18  16   Temp: 98.5 F (36.9 C) 97.7 F (36.5 C) (!) 97.4 F (36.3 C) 97.9 F (36.6 C)  TempSrc: Oral  Oral Oral  SpO2: 95% 98% 99% 98%  Weight:      Height:       Supplemental O2: Room Air SpO2: 98 % O2 Flow Rate (L/min): 2 L/min  Filed Weights   03/15/21 0014  Weight: 106.1 kg     Intake/Output Summary (Last 24 hours) at 03/20/2021 1516 Last data filed at 03/19/2021 2000 Gross per 24 hour  Intake --  Output 0 ml  Net 0 ml   Net IO Since Admission: -1,908.5 mL [03/20/21 1516]  Physical Exam: Constitutional: Patient is resting in bed.  No acute distress noted. Cardio: Regular rate and rhythm.  No murmurs, rubs, gallops. Pulm: Clear to auscultation bilaterally.  Normal work of breathing. Abdomen: Soft, nontender, nondistended. MSK: Bilateral SCDs on lower extremities. Skin: Skin is warm and dry. Neuro: Alert and oriented x3.  No focal deficit noted. Psych: Appropriate mood and affect.   Patient Lines/Drains/Airways Status     Active Line/Drains/Airways     Name Placement date Placement time Site Days   Peripheral IV 03/14/21 20 G 1.88" Anterior;Left;Proximal Forearm 03/14/21  1549  Forearm  6   PICC Single Lumen 01/15/21 Right Basilic 39 cm 0 cm 01/15/21  01/17/21   Basilic  64   Closed System Drain 1 Left Flank Bulb (JP) 12 Fr. 01/27/21  1407  Flank  52   Closed System Drain Lateral LLQ Bulb (JP) 10 Fr. 03/16/21  1335  LLQ  4   Incision (Closed) 04/18/20 Flank Left 04/18/20  1419  -- 336   Incision (Closed) 01/13/21 Back 01/13/21  1016  -- 66   Incision (Closed) 03/14/21 Back 03/14/21  2006  -- 6   Wound / Incision (Open or Dehisced) 01/29/21 Non-pressure wound;Other (Comment) Buttocks Left 01/29/21  0900  Buttocks  50             ASSESSMENT/PLAN:  Assessment: Active Problems:   Vertebral osteomyelitis (HCC)   Empyema (HCC)   Sepsis due to Escherichia coli (E. coli) (HCC)   Retroperitoneal fluid collection   Epidural abscess   Discitis of thoracic region   Plan: #Epidural abscess Fluid culture and blood cultures continue to shows no growth to date.  He continues to endorse significant pain.   -Neurosurgery on board, appreciate their recommendations. -Patient is on bed rest with exception of mobilization to bedside commode.  Continue to work with PT in bed. -TSLO brace when out of bed and as needed -Repeat MRI, CT abdomen/pelvis 9/24 -He will need repeat surgery to thoracic spine after infection is cleared -ID on board, appreciate their recommendations -  Per ID, timing of new hardware placement is after 1 to 2 weeks of antibiotic treatment without worsening of disease, starting from date of last surgery on 9/17             -Continue cefazolin 2 g IV every 8 hours -Trend inflammatory markers every 48 hours   #Hypertension Chronically managed.  BP of 130/83 today. -Continue amlodipine 5 mg daily   #Retroperitoneal fluid collection Noted on imaging done at admission.  S/P IR aspiration and drain placement.   -Monitor  Best Practice: Diet: Regular diet IVF: Fluids: None VTE: enoxaparin (LOVENOX) injection 40 mg Start: 03/20/21 0951 SCDs Start: 03/14/21 1708 Code: Full AB: Cefazolin DISPO: Continue to monitor  inpatient.  Signature: Champ Mungo, D.O.  Internal Medicine Resident, PGY-1 [facility]  Pager: [phone] 3:16 PM, 03/20/2021   Please contact the on call pager after 5 pm and on weekends at [phone].

## 2021-03-20 NOTE — Progress Notes (Signed)
Physical Therapy Treatment Patient Details Name: Marco Kennedy MRN: [id number] DOB: 01/27/1951 Today's Date: 03/20/2021   History of Present Illness Pt is a 70 y.o. 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 He is now s/p T9-T10 laminectomy for decompression of thecal sac and debridement of abscess 9/18. Drain placed in left retroperitoneal space on 03/17/19 on bulb suction. Pt had back sx  01/2020 with Dr. Venetia Maxon; admission 7/16-22 for sepsis and again 7/26 for SOB and fever. PMHx: back sx, arthritis, degenerative lumbar spinal stenosis, THA 2017.    PT Comments    Pt received in supine, reporting "not much has changed" as far as pain/numbness and paraesthesias, but agreeable to bed-level session. Pt performed BLE A/AAROM therapeutic exercises, with bilateral quad lag noted during straight leg raises and ataxic/poorly controlled speed and movements during quad/hip flexor and abduction exercises. Pt continues to benefit from PT services to progress toward functional mobility goals. Will continue to assess disposition pending upcoming surgery possible for early next week.  Recommendations for follow up therapy are one component of a multi-disciplinary discharge planning process, led by the attending physician.  Recommendations may be updated based on patient status, additional functional criteria and insurance authorization.  Follow Up Recommendations  CIR;Supervision/Assistance - 24 hour     Equipment Recommendations  Other (comment) (TBD)    Recommendations for Other Services Rehab consult     Precautions / Restrictions Precautions Precautions: Fall;Back Precaution Booklet Issued: Yes (comment) Precaution Comments: bulb drain- abdomen, TLSO in room Required Braces or Orthoses: Spinal Brace Spinal Brace: Thoracolumbosacral orthotic;Other (comment) Spinal Brace Comments: per Dr Venetia Maxon note 9/23, does not need TLSO in supine,  only when OOB/EOB Restrictions Weight Bearing Restrictions: No     Mobility  Bed Mobility Overal bed mobility: Needs Assistance Bed Mobility: Rolling Rolling: Max assist         General bed mobility comments: multimodal cues and assist to bend each LE prior to rolling; pt rolled to R side for repositioning/pressure relief needs mod to maxA at hips due to difficulty with LLE/hip extension movement but able to initiate reaching to bed rail    Transfers                 General transfer comment: defer, no +2 assist available  Ambulation/Gait             General Gait Details: unable   Stairs             Wheelchair Mobility    Modified Rankin (Stroke Patients Only)       Balance                                            Cognition Arousal/Alertness: Awake/alert Behavior During Therapy: WFL for tasks assessed/performed;Anxious Overall Cognitive Status: Within Functional Limits for tasks assessed                                 General Comments: pt with some LE ataxia/uncontrolled movements and frequent guarding/abdominal bracing, needs cues for slow, deliberate movements and pursed-lip breathing; A&O. Pt able to state precautions but difficulty complying with "no twisting" precaution, handout in room to reinforce      Exercises Other Exercises Other Exercises: supine BLE AROM: ankle pumps, heel  slides (AA on LLE), hip abduction (AA for smooth/controlled ROM), hip ADduction, SAQ, SLR (AA, quad lag) x10 reps ea    General Comments General comments (skin integrity, edema, etc.): VSS on RA      Pertinent Vitals/Pain Pain Assessment: Faces Faces Pain Scale: Hurts even more Pain Location: lower back, abdomen Pain Descriptors / Indicators: Discomfort;Grimacing;Guarding;Numbness Pain Intervention(s): Limited activity within patient's tolerance;Monitored during session;Repositioned;Premedicated before session    Home  Living                      Prior Function            PT Goals (current goals can now be found in the care plan section) Acute Rehab PT Goals Patient Stated Goal: less pain PT Goal Formulation: With patient Time For Goal Achievement: 03/30/21 Progress towards PT goals: Progressing toward goals    Frequency    Min 5X/week      PT Plan Current plan remains appropriate    Co-evaluation              AM-PAC PT "6 Clicks" Mobility   Outcome Measure  Help needed turning from your back to your side while in a flat bed without using bedrails?: A Lot Help needed moving from lying on your back to sitting on the side of a flat bed without using bedrails?: Total Help needed moving to and from a bed to a chair (including a wheelchair)?: Total Help needed standing up from a chair using your arms (e.g., wheelchair or bedside chair)?: Total Help needed to walk in hospital room?: Total Help needed climbing 3-5 steps with a railing? : Total 6 Click Score: 7    End of Session   Activity Tolerance: Patient limited by pain Patient left: in bed;with call bell/phone within reach;with bed alarm set;with SCD's reapplied (heels floated, semi-sidelying to R side) Nurse Communication: Mobility status;Need for lift equipment;Other (comment) (will need hoyer lift for OOB) PT Visit Diagnosis: Other abnormalities of gait and mobility (R26.89);Pain Pain - part of body:  (back/abdomen)     Time: 2751-7001 PT Time Calculation (min) (ACUTE ONLY): 19 min  Charges:  $Therapeutic Exercise: 8-22 mins                     Vallerie Hentz P., PTA Acute Rehabilitation Services Pager: [phone] Office: [phone]    [place] 03/20/2021, 4:01 PM

## 2021-03-20 NOTE — Progress Notes (Signed)
Subjective: Patient reports that his pain continues unchanged. No acute events overnight.   Objective: Vital signs in last 24 hours: Temp:  [97.7 F (36.5 C)-98.5 F (36.9 C)] 97.7 F (36.5 C) (09/23 0441) Pulse Rate:  [77-94] 84 (09/23 0441) Resp:  [17-18] 18 (09/23 0441) BP: (120-140)/(71-86) 130/83 (09/23 0441) SpO2:  [95 %-98 %] 98 % (09/23 0441)  Intake/Output from previous day: No intake/output data recorded. Intake/Output this shift: No intake/output data recorded.  Physical Exam: Patient is awake, A/O X 4, conversant, and in good spirits. They are in NAD and VSS. Speech is fluent and appropriate. MAEW with good strength. PERLA, EOMI. CNs grossly intact. Dressing is CDI.   Lab Results: Recent Labs    03/19/21 0514 03/20/21 0620  WBC 8.7 8.0  HGB 10.1* 10.4*  HCT 31.9* 32.5*  PLT 404* 411*   BMET Recent Labs    03/19/21 0514 03/20/21 0620  NA 134* 133*  K 4.0 3.9  CL 94* 94*  CO2 31 30  GLUCOSE 124* 127*  BUN 16 15  CREATININE 0.70 0.61  CALCIUM 8.9 8.7*    Studies/Results: No results found.  Assessment/Plan: 70 y.o. male who is POD#5 s/p T9-10 laminectomy for SEA. The patient remains at his baseline neurological status. Back pain and BLE pain continue to be severe. Ambulation and mobility are severely limited due to pain. 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 Plan for thoracic and lumbar MRI with and without contrast after 1 week of antibiotics. IR perc drain for retroperitoneal collection in place. Continue supportive care.    PLAN: - Abx per ID - Continue working with PT/OT in the bed - Continue working on pain control - Encourage mobilization as tolerated - TLSO brace when OOB and PRN, he does not need to wear the brace while in bed - thoracic and lumbar MRI with and without contrast after 7 days of antibiotic - Will need additional surgery in his thoracic spine once medically stable - Ok to begin SQ heparin or  Lovenox      Council Mechanic, DNP, NP-C 03/20/2021, 8:10 AM

## 2021-03-20 NOTE — Progress Notes (Signed)
Pt states they will self administer CPAP. CPAP placed at bedside in reach. RT will cont to monitor.

## 2021-03-20 NOTE — Progress Notes (Signed)
[facility]  Date of Admission:  03/14/2021     Lines:  7/21-c Right ue picc      Abx: 9/22-c cefazolin  9/19-22 ertapenem 9/17-19 vanc 9/17-19 cefepime   7/21-9/17 cefazolin (outpatient/previous admission)     Assessment/Plan 70 yo male previous thoracolumbar hardware/back surgery 03/2020 complicated by thoracic vertebral om/epidural abscess s/p recent I&D 7/19 with cx ecoli (bcx negative at that time), with subsequent bilateral empyema (fluid cx negative) and intraabd fluid collection (cx negative 8/02). Has had complex course requiring bilateral chest tube and abd collection drain (cx negative). The abd drain was removed 8/30 after 8/20 repeat ct showed improving abscess size. He was readmitted now 9/17 after worsening LE pain and also mri repeat 9/16 showing worsening thoracic epidural process along with increasing abd fluid collection.        #vertebral OM/epidural abscess -- hardware related #retroperitoneal fluid collection s/p drain removal 8/30 #exudative bilateral pleural effusion/empyema s/p bilateral chest tubes Initial epidural abscess I&D 7/19; cx ecoli Pleural fluid cx negative and abd abscess cx negative 7/27 tte no evidence endocarditis 9/16 mri thoracic spine worsening thoracic epidural process; also clinically worsening LE pain, s/p I&D 9/17 9/17 operative cx ngtd 9/17 admission bcx ngtd   In terms of microbiologic diagnosis, suspect this is hardware related issue being barrier to clearance of infection. In the lesser probability of abx lackign coverage, would worry more about resistance development rather than new organisms. Will switch abx to ertapenem pending cx result   Agree with new perc drain into abd abscess per IR  -------------- 9/23 assessment Abd abscess fluid and spinal surgical wound cx are negative, confirming that this is a burden of disease/source control issue in setting of metals/hardware. He had had  2 surgical procedures by now to debride his back so hopefully that will help.   Stable overall No clinical sepsis   Await abd ct and mri spine this Saturday  Crp 9/22    12 9/20    15  Plan: -continue cefazolin -imaging ct/mri ordered for tomorrow -patient asking for pain/anxiolytic meds for mri imaging; defer to primary team -continue q48hours inflammatory markers trend -discussed with nsg/primary team   I spent more than 35 minute reviewing data/chart, and coordinating care and >50% direct face to face time providing counseling/discussing diagnostics/treatment plan with patient     Active Problems:   Vertebral osteomyelitis (HCC)   Empyema (HCC)   Sepsis due to Escherichia coli (E. coli) (HCC)   Retroperitoneal fluid collection   Epidural abscess   Discitis of thoracic region   No Known Allergies  Scheduled Meds:  acetaminophen  650 mg Oral Q6H   amLODipine  5 mg Oral Daily   Chlorhexidine Gluconate Cloth  6 each Topical Daily   enoxaparin (LOVENOX) injection  40 mg Subcutaneous Q24H   polyethylene glycol  17 g Oral Daily   senna-docusate  2 tablet Oral Daily   sodium chloride flush  5 mL Intracatheter Q8H   Continuous Infusions:   ceFAZolin (ANCEF) IV 2 g (03/20/21 0612)   PRN Meds:.fentaNYL, HYDROmorphone (DILAUDID) injection, midazolam, oxyCODONE   SUBJECTIVE: No complaint No f/c No n/v/diarrhea No incontinence  Review of Systems: ROS All other ROS was negative, except mentioned above     OBJECTIVE: Vitals:   03/19/21 2355 03/20/21 0441 03/20/21 0938 03/20/21 1229  BP: 120/74 130/83 (!) 142/87 (!) 143/86  Pulse: 88 84 85 82  Resp: 18 18  16  Temp: 98.5 F (36.9 C) 97.7 F (36.5 C) (!) 97.4 F (36.3 C) 97.9 F (36.6 C)  TempSrc: Oral  Oral Oral  SpO2: 95% 98% 99% 98%  Weight:      Height:       Body mass index is 36.65 kg/m.  Physical Exam  General/constitutional: no distress, pleasant HEENT: Normocephalic, PER, Conj Clear,  EOMI, Oropharynx clear Neck supple CV: rrr no mrg Lungs: clear to auscultation, normal respiratory effort Abd: Soft, Nontender; perc drain intact, minimal sedimentous output Neuro: nonfocal MSK: back surgical site no erythema/tenderness/fluctuance; surgical honeycomb dressing in place and clean     Lab Results Lab Results  Component Value Date   WBC 8.0 03/20/2021   HGB 10.4 (L) 03/20/2021   HCT 32.5 (L) 03/20/2021   MCV 86.2 03/20/2021   PLT 411 (H) 03/20/2021    Lab Results  Component Value Date   CREATININE 0.61 03/20/2021   BUN 15 03/20/2021   NA 133 (L) 03/20/2021   K 3.9 03/20/2021   CL 94 (L) 03/20/2021   CO2 30 03/20/2021    Lab Results  Component Value Date   ALT 7 03/14/2021   AST 25 03/14/2021   ALKPHOS 79 03/14/2021   BILITOT 0.5 03/14/2021      Microbiology: Recent Results (from the past 240 hour(s))  Blood culture (routine x 2)     Status: None   Collection Time: 03/14/21  3:54 PM   Specimen: BLOOD  Result Value Ref Range Status   Specimen Description BLOOD SITE NOT SPECIFIED  Final   Special Requests   Final    BOTTLES DRAWN AEROBIC AND ANAEROBIC Blood Culture adequate volume   Culture   Final    NO GROWTH 5 DAYS Performed at [facility], [address] [place], [place] [address]    Report Status 03/19/2021 FINAL  Final  Blood culture (routine x 2)     Status: None   Collection Time: 03/14/21  3:54 PM   Specimen: BLOOD  Result Value Ref Range Status   Specimen Description BLOOD LEFT ANTECUBITAL  Final   Special Requests   Final    BOTTLES DRAWN AEROBIC AND ANAEROBIC Blood Culture adequate volume   Culture   Final    NO GROWTH 5 DAYS Performed at [facility], [address], [place], [place] [address]    Report Status 03/19/2021 FINAL  Final  Resp Panel by RT-PCR (Flu A&B, Covid) Nasopharyngeal Swab     Status: None   Collection Time: 03/14/21  4:33 PM   Specimen: Nasopharyngeal Swab; Nasopharyngeal(NP) swabs in vial  transport medium  Result Value Ref Range Status   SARS Coronavirus 2 by RT PCR NEGATIVE NEGATIVE Final    Comment: (NOTE) SARS-CoV-2 target nucleic acids are NOT DETECTED.  The SARS-CoV-2 RNA is generally detectable in upper respiratory specimens during the acute phase of infection. The lowest concentration of SARS-CoV-2 viral copies this assay can detect is 138 copies/mL. A negative result does not preclude SARS-Cov-2 infection and should not be used as the sole basis for treatment or other patient management decisions. A negative result may occur with  improper specimen collection/handling, submission of specimen other than nasopharyngeal swab, presence of viral mutation(s) within the areas targeted by this assay, and inadequate number of viral copies(<138 copies/mL). A negative result must be combined with clinical observations, patient history, and epidemiological information. The expected result is Negative.  Fact Sheet for Patients:  BloggerCourse.com  Fact Sheet for Healthcare  Providers:  SeriousBroker.it  This test is no t yet approved or cleared by the [place] and  has been authorized for detection and/or diagnosis of SARS-CoV-2 by FDA under an Emergency Use Authorization (EUA). This EUA will remain  in effect (meaning this test can be used) for the duration of the COVID-19 declaration under Section 564(b)(1) of the Act, 21 U.S.C.section 360bbb-3(b)(1), unless the authorization is terminated  or revoked sooner.       Influenza A by PCR NEGATIVE NEGATIVE Final   Influenza B by PCR NEGATIVE NEGATIVE Final    Comment: (NOTE) The Xpert Xpress SARS-CoV-2/FLU/RSV plus assay is intended as an aid in the diagnosis of influenza from Nasopharyngeal swab specimens and should not be used as a sole basis for treatment. Nasal washings and aspirates are unacceptable for Xpert Xpress SARS-CoV-2/FLU/RSV testing.  Fact  Sheet for Patients: BloggerCourse.com  Fact Sheet for Healthcare Providers: SeriousBroker.it  This test is not yet approved or cleared by the [place] FDA and has been authorized for detection and/or diagnosis of SARS-CoV-2 by FDA under an Emergency Use Authorization (EUA). This EUA will remain in effect (meaning this test can be used) for the duration of the COVID-19 declaration under Section 564(b)(1) of the Act, 21 U.S.C. section 360bbb-3(b)(1), unless the authorization is terminated or revoked.  Performed at [facility], [address], [place], [place] [address]   Aerobic/Anaerobic Culture w Gram Stain (surgical/deep wound)     Status: None   Collection Time: 03/14/21  7:33 PM   Specimen: PATH Other; Tissue  Result Value Ref Range Status   Specimen Description ABSCESS  Final   Special Requests THORACIC 9 AND 10  Final   Gram Stain NO WBC SEEN NO ORGANISMS SEEN   Final   Culture   Final    No growth aerobically or anaerobically. Performed at [facility], [address] [place], [place] [address]    Report Status 03/19/2021 FINAL  Final  Aerobic/Anaerobic Culture w Gram Stain (surgical/deep wound)     Status: None (Preliminary result)   Collection Time: 03/16/21  1:57 PM   Specimen: Abscess  Result Value Ref Range Status   Specimen Description ABSCESS  Final   Special Requests ABDOMEN  Final   Gram Stain   Final    RARE WBC PRESENT, PREDOMINANTLY MONONUCLEAR NO ORGANISMS SEEN    Culture   Final    NO GROWTH 4 DAYS NO ANAEROBES ISOLATED; CULTURE IN PROGRESS FOR 5 DAYS Performed at [facility], [address], [place], [place] [address]    Report Status PENDING  Incomplete     Serology:   Imaging: If present, new imagings (plain films, ct scans, and mri) have been personally visualized and interpreted; radiology reports have been reviewed. Decision making incorporated into the Impression  / Recommendations.  9/17 abd pelv ct 1. 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 There is stable lucency surrounding the T10 pedicle screws likely secondary to infection. There is paravertebral soft tissue thickening without discrete fluid collection. 2. Left pelvic fluid collection has increased in size. The drain has been removed in the interval. Correlate clinically for abscess. 3. Loculated pleural fluid collection in the right lower hemithorax has slightly decreased in size, but has not resolved. Findings are likely related to patient's known empyema.  9/17 mri thoracic spine 1. Worsening findings of discitis-osteomyelitis at T9-10 and T10-11 with enlargement of the epidural abscess at these levels resulting  in severe spinal stenosis at T10-11. No cord edema. 2. Persistent extensive paravertebral inflammation/phlegmon. Decreased size of loculated bilateral pleural collections.     Raymondo Band, MD [facility] [phone] pager    03/20/2021, 1:48 PM

## 2021-03-21 ENCOUNTER — Inpatient Hospital Stay (HOSPITAL_COMMUNITY): Payer: BC Managed Care – PPO

## 2021-03-21 DIAGNOSIS — K5900 Constipation, unspecified: Secondary | ICD-10-CM | POA: Diagnosis not present

## 2021-03-21 DIAGNOSIS — G062 Extradural and subdural abscess, unspecified: Secondary | ICD-10-CM | POA: Diagnosis not present

## 2021-03-21 LAB — CBC WITH DIFFERENTIAL/PLATELET
Abs Immature Granulocytes: 0.05 10*3/uL (ref 0.00–0.07)
Basophils Absolute: 0 10*3/uL (ref 0.0–0.1)
Basophils Relative: 0 %
Eosinophils Absolute: 0.3 10*3/uL (ref 0.0–0.5)
Eosinophils Relative: 3 %
HCT: 32.9 % — ABNORMAL LOW (ref 39.0–52.0)
Hemoglobin: 10.2 g/dL — ABNORMAL LOW (ref 13.0–17.0)
Immature Granulocytes: 1 %
Lymphocytes Relative: 7 %
Lymphs Abs: 0.7 10*3/uL (ref 0.7–4.0)
MCH: 26.7 pg (ref 26.0–34.0)
MCHC: 31 g/dL (ref 30.0–36.0)
MCV: 86.1 fL (ref 80.0–100.0)
Monocytes Absolute: 0.8 10*3/uL (ref 0.1–1.0)
Monocytes Relative: 8 %
Neutro Abs: 8.1 10*3/uL — ABNORMAL HIGH (ref 1.7–7.7)
Neutrophils Relative %: 81 %
Platelets: 430 10*3/uL — ABNORMAL HIGH (ref 150–400)
RBC: 3.82 MIL/uL — ABNORMAL LOW (ref 4.22–5.81)
RDW: 14.1 % (ref 11.5–15.5)
WBC: 10 10*3/uL (ref 4.0–10.5)
nRBC: 0 % (ref 0.0–0.2)

## 2021-03-21 LAB — BASIC METABOLIC PANEL
Anion gap: 9 (ref 5–15)
BUN: 13 mg/dL (ref 8–23)
CO2: 30 mmol/L (ref 22–32)
Calcium: 8.6 mg/dL — ABNORMAL LOW (ref 8.9–10.3)
Chloride: 95 mmol/L — ABNORMAL LOW (ref 98–111)
Creatinine, Ser: 0.6 mg/dL — ABNORMAL LOW (ref 0.61–1.24)
GFR, Estimated: >60 mL/min (ref >60)
Glucose, Bld: 139 mg/dL — ABNORMAL HIGH (ref 70–99)
Potassium: 4.3 mmol/L (ref 3.5–5.1)
Sodium: 134 mmol/L — ABNORMAL LOW (ref 135–145)

## 2021-03-21 LAB — AEROBIC/ANAEROBIC CULTURE W GRAM STAIN (SURGICAL/DEEP WOUND): Culture: NO GROWTH

## 2021-03-21 LAB — C-REACTIVE PROTEIN: CRP: 6.8 mg/dL — ABNORMAL HIGH (ref <1.0)

## 2021-03-21 IMAGING — MR MR LUMBAR SPINE WO/W CM
13 of 24 series · 16 of 48 positions shown · IV contrast (gadavist)
Comparison: Previous MRI from [DATE].

CLINICAL DATA: Follow-up examination for epidural abscess.

EXAM:
MRI THORACIC AND LUMBAR SPINE WITHOUT AND WITH CONTRAST
TECHNIQUE: Multiplanar and multiecho pulse sequences of the thoracic and lumbar
spine were obtained without and with intravenous contrast.
CONTRAST:  10mL GADAVIST GADOBUTROL 1 MMOL/ML IV SOLN

[Series 12: T1 · sagittal · 5.0mm · 1.46mm/px · 1 of 9 slices shown (1 of 6)]
[im 1/9]
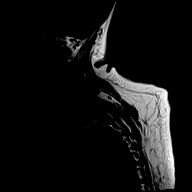

[Series 13: T1 · sagittal · 5.0mm · 1.23mm/px · 1 of 9 slices shown (2 of 6)]
[im 1/9]
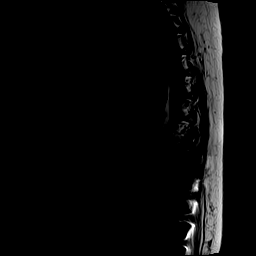

[Series 14: T1 · sagittal · 6.0mm · 1.23mm/px · 1 of 9 slices shown (3 of 6)]
[im 1/9]
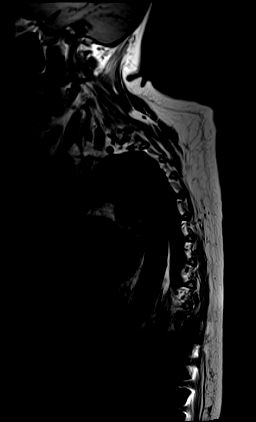

[Series 15: T2 · sagittal · 3.0mm · 0.76mm/px · 1 of 20 slices shown (1 of 3)]
[im 1/20]
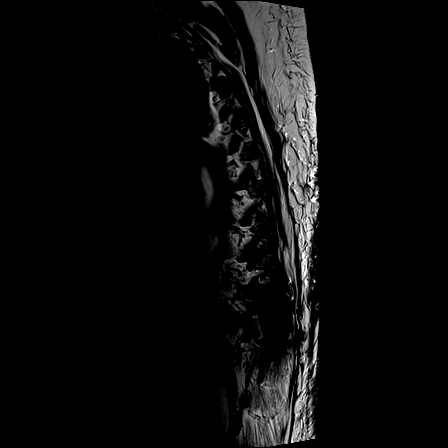

[Series 16: STIR · sagittal · 3.0mm · 0.38mm/px · 1 of 20 slices shown (1 of 2)]
[im 1/20]
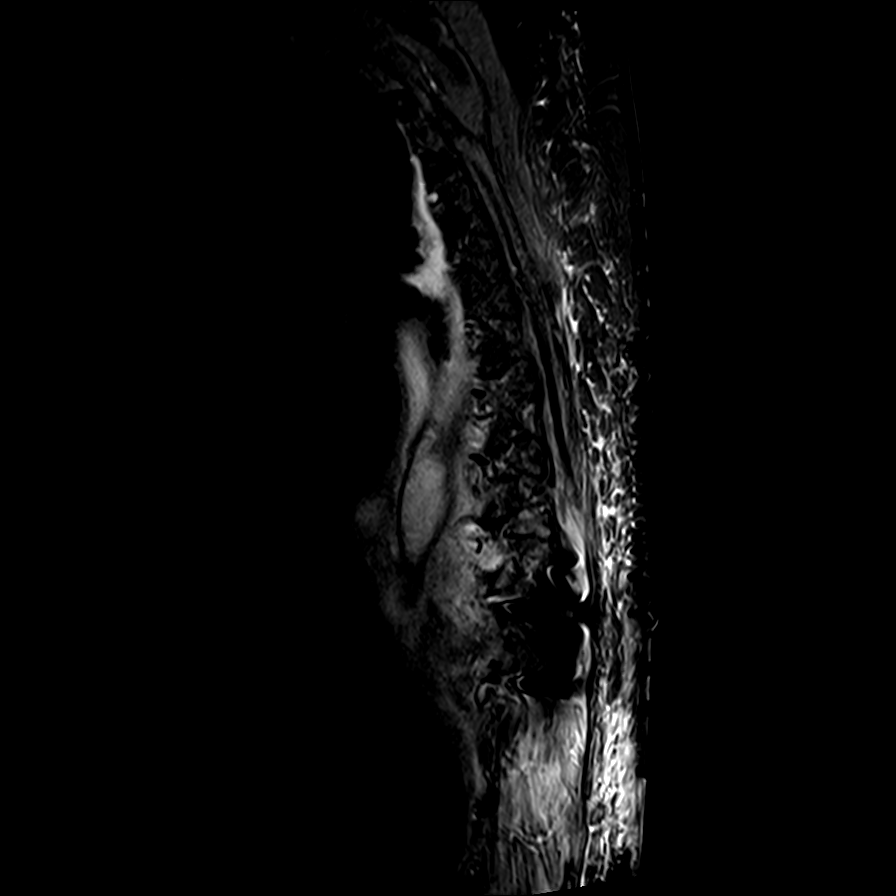

[Series 17: T1 · sagittal · 3.0mm · 0.76mm/px · 1 of 20 slices shown (4 of 6)]
[im 1/20]
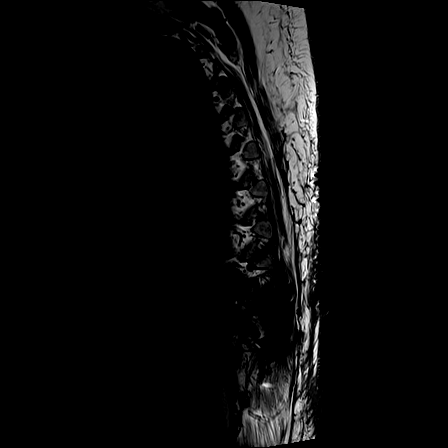

[Series 22: T2 · sagittal · 4.0mm · 0.73mm/px · 1 of 17 slices shown (2 of 3)]
[im 1/17]
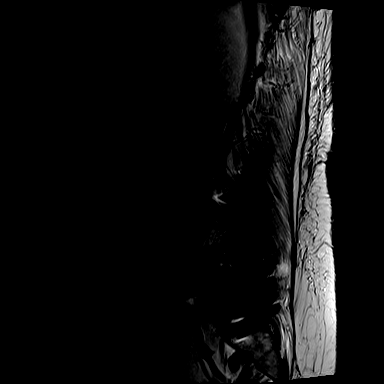

[Series 23: T1 · sagittal · 4.0mm · 0.88mm/px · 1 of 17 slices shown (5 of 6)]
[im 1/17]
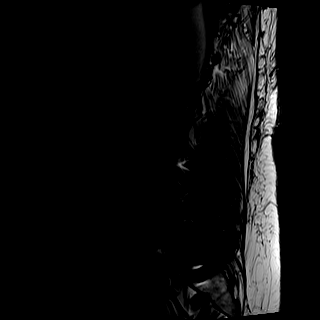

[Series 24: STIR · sagittal · 4.0mm · 0.55mm/px · 1 of 17 slices shown (2 of 2)]
[im 1/17]
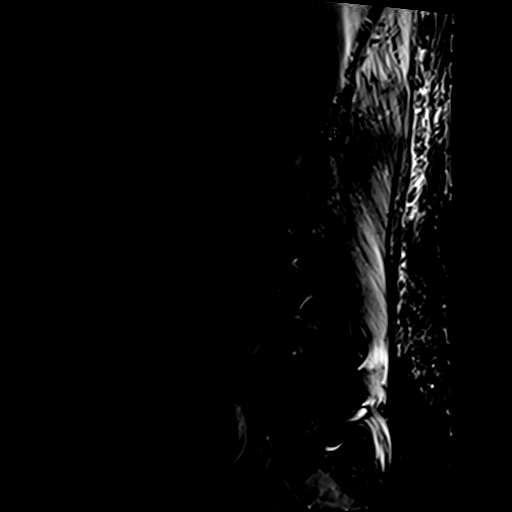

[Series 25: T2 · axial · 4.0mm · 0.57mm/px · z∈[-508,-297]mm · 2 of 34 slices shown (3 of 3)]
[im 1/34]
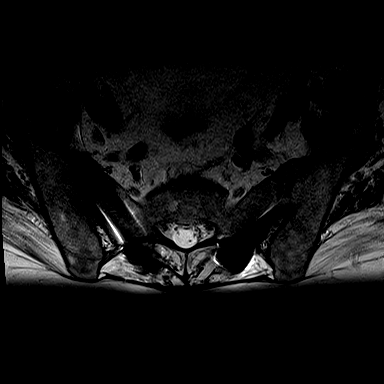
[im 34/34]
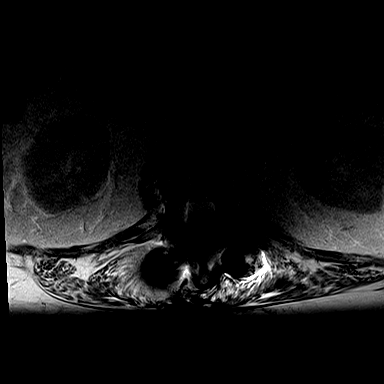

[Series 26: T1 · axial · 4.0mm · 0.34mm/px · z∈[-508,-297]mm · 2 of 34 slices shown (6 of 6)]
[im 1/34]
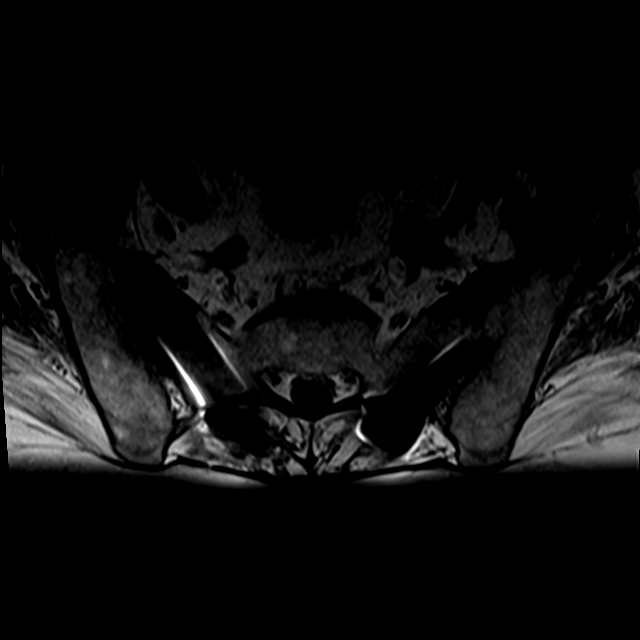
[im 34/34]
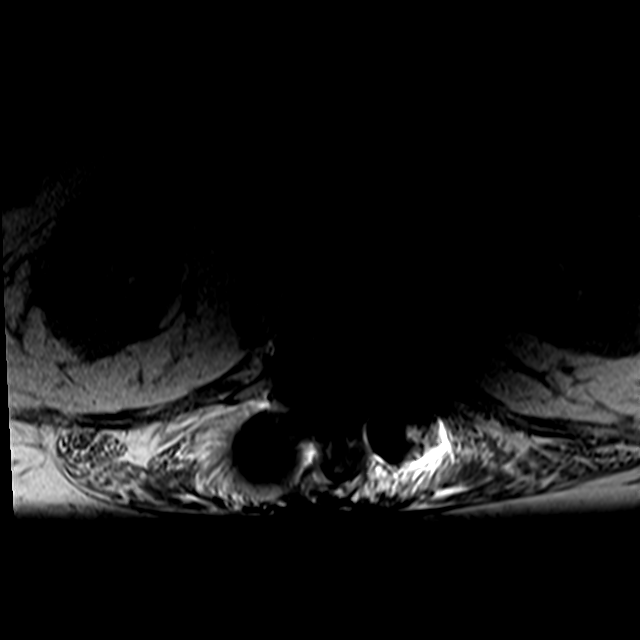

[Series 27: T1 fat-sat post-contrast · sagittal · 4.0mm · 0.88mm/px · 1 of 17 slices shown]
[im 1/17]
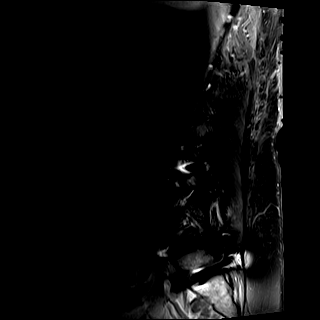

[Series 28: T1 post-contrast · axial · 4.0mm · 0.34mm/px · z∈[-508,-297]mm · 2 of 34 slices shown]
[im 1/34]
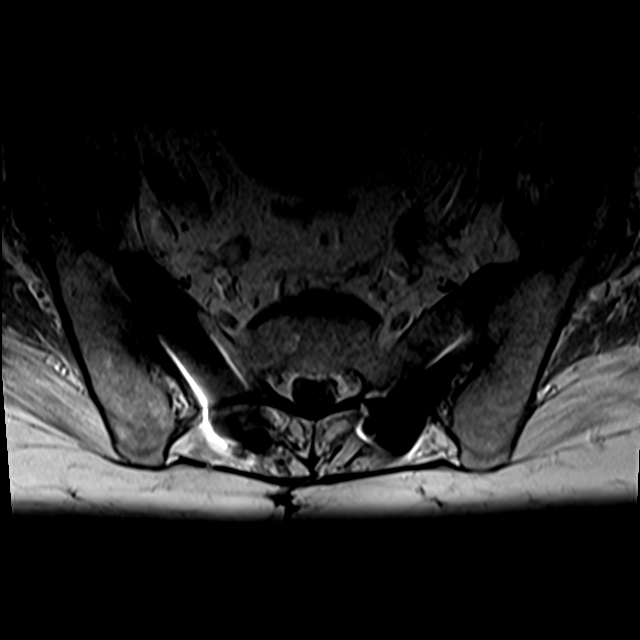
[im 34/34]
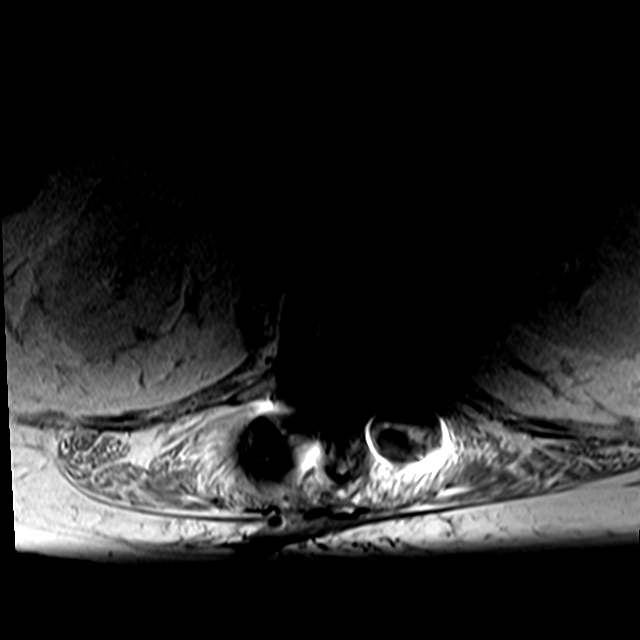

[16 of 48 positions shown; findings below may reference images not displayed]

FINDINGS: MRI THORACIC SPINE FINDINGS

Alignment: Mildly increased lower thoracic kyphosis, with trace
retrolisthesis of T11 on T12 and T12 on L1. Trace anterolisthesis of
T9 on T10. Alignment is stable from prior.

Vertebrae: Postoperative changes from interval decompressive
laminectomy at T9 and T10 is seen. Small collection at the operative
site measuring 1.4 x 2.5 cm without mass effect, consistent with a
likely postoperative seroma. Overall, the operative site is
satisfactory in appearance without complication. Posterior fusion
extending from T10 through the upper lumbar spine again seen.

Persistent findings of osteomyelitis discitis at T9-10 and T10-11
with persistent marrow edema throughout the T9, T10, and T11
vertebral bodies. Persistent increased fluid signal intensity within
the T9-10 and T10-11 disc spaces. Associated endplate erosion is not
significantly changed or progressed from prior. Associated anterior
height loss at the T11 vertebral body, and to a lesser extent the
anterior aspect of the T9 inferior endplate, stable. Persistent
ventral epidural abscess/phlegmon seen extending from T8 through
T11. This measures up to 9 mm in maximal AP diameter at the level of
T10. Overall size of this collection appears somewhat decreased,
with less mass effect and posterior bowing of the thoracic cord at
this level. Enhancement involving the posterior thecal sac extending
from T7 through the operative site could reflect additional epidural
infection and/or postoperative change. The thecal sac has been well
decompressed at the laminectomy site, however, there remains
persistent fairly severe spinal stenosis just below the operative
site at the level of T10-11 (series 16, image 11), slightly worsened
in appearance from previous. For example, the thecal sac now
measures 5 mm in AP diameter at its most narrow point, previously 6
mm on [DATE]. Associated moderate cord flattening at this level
with no definite or convincing cord signal changes.

Cord: Moderate cord flattening at the level of T10-11 without
convincing cord signal changes. Slightly improved mass effect by the
ventral epidural collection at the levels of T8 through T10.

Paraspinal and other soft tissues: Normal expected postoperative
changes within the lower posterior paraspinous soft tissues.
Persistent irregular paraspinous edema/phlegmon about the lower
thoracic spine. Irregular loculated bilateral pleural collections
are similar, likely empyema.

Disc levels:

Postoperative and infectious findings in the lower thoracic spine as
detailed above. Minor spondylosis and moderate facet arthrosis
elsewhere within the thoracic spine without significant stenosis.

MRI LUMBAR SPINE FINDINGS

Segmentation:  Standard.

Alignment: Sigmoid scoliotic curvature. Chronic grade 1
retrolisthesis of L1 on L2, with grade 1 anterolisthesis of L3 on L4
and L4 on L5, stable.

Vertebrae: Extensive postoperative changes extending from T10
through the sacrum. Chronic height loss at L2, stable. No new or
interval fracture. No evidence for new or progressive osteomyelitis
discitis or other infection within the lumbar spine.

Conus medullaris: Extends to the L1-2 level and appears normal. No
abnormal enhancement or epidural collections within the lumbar
spine.

Paraspinal and other soft tissues: Normal expected postoperative
changes present within the posterior paraspinous soft tissues. No
new loculated collections.

Disc levels:

Extensive postoperative changes with underlying residual multilevel
spondylosis and facet arthrosis, grossly stable and not
significantly changed from previous. No significant spinal stenosis
within the lumbar spine. Residual multilevel foraminal narrowing is
grossly stable.
IMPRESSION: 1. Postoperative changes from interval decompressive laminectomy at
T9 and T10. Satisfactory appearance of the laminectomy site, with
improved stenosis at these levels. However, there is persistent
severe spinal stenosis just below the operative site at the level of
T10-11 with moderate cord flattening, slightly worsened as compared
to [DATE]. No definite or convincing cord signal changes at this
time.
2. Persistent findings of osteomyelitis discitis at T9-10 and
T10-11. Associated ventral epidural abscess extending from T8
through T11 is overall slightly decreased in size as compared to
previous.
3. Persistent paravertebral inflammation and phlegmon, with
persistent irregular loculated bilateral pleural
collections/empyema.
4. No other new or progressive findings elsewhere within the
thoracolumbar spine.

## 2021-03-21 IMAGING — CT CT ABD-PELV W/ CM
2 of 5 series · 16 of 46 positions shown, 18 images · IV contrast (omnipaque)
Comparison: [DATE] and [DATE].

CLINICAL DATA: Follow-up for intra-abdominal abscess.

EXAM:
CT ABDOMEN AND PELVIS WITH CONTRAST
TECHNIQUE: Multidetector CT imaging of the abdomen and pelvis was performed
using the standard protocol following bolus administration of
intravenous contrast.
CONTRAST:  100mL OMNIPAQUE IOHEXOL 350 MG/ML SOLN

[Series 3: abdomen 5.0 · axial · 0.94mm/px · z∈[+837,+1282]mm · 13 of 103 slices shown, 15 images]
[im 7/103  soft-tissue]
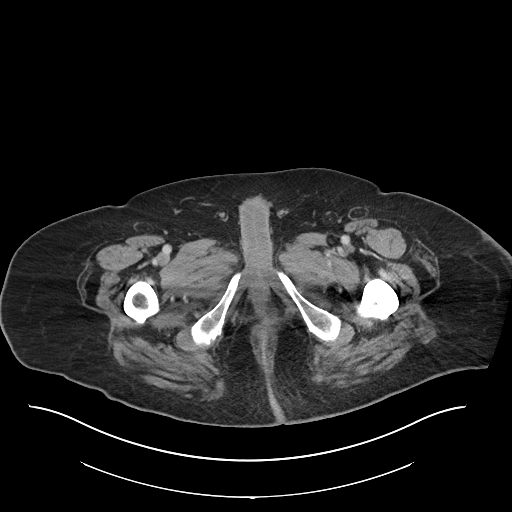
[im 7/103  bone]
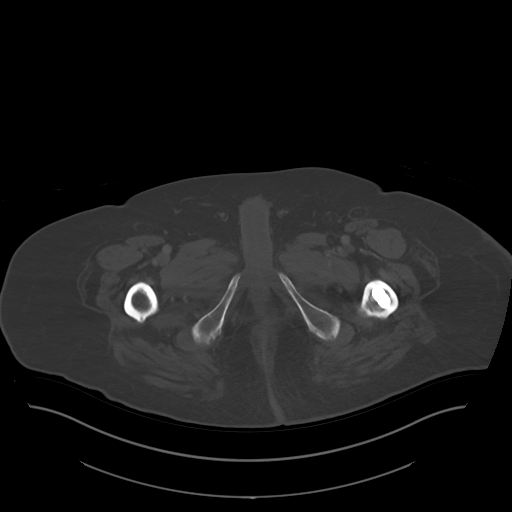
[im 14/103  soft-tissue]
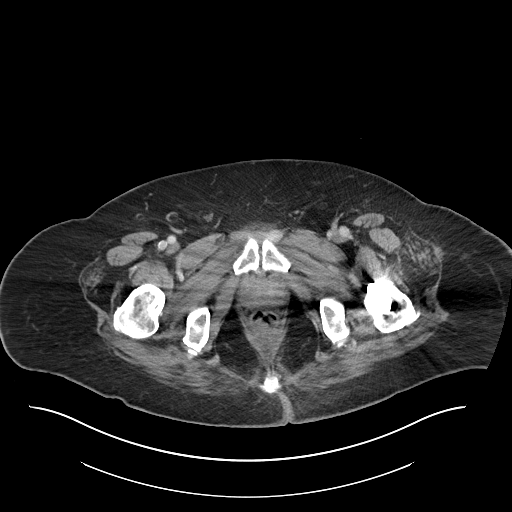
[im 21/103  soft-tissue]
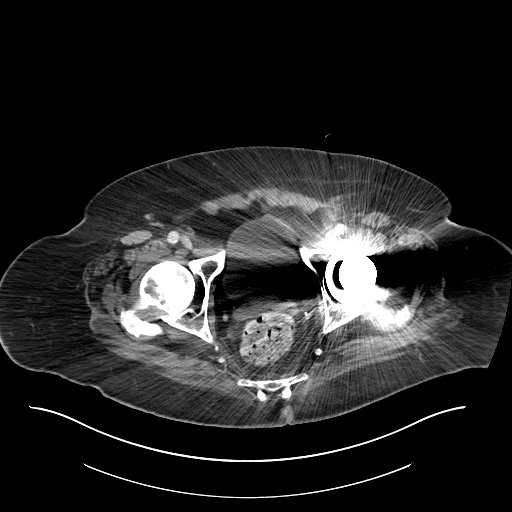
[im 28/103  soft-tissue]
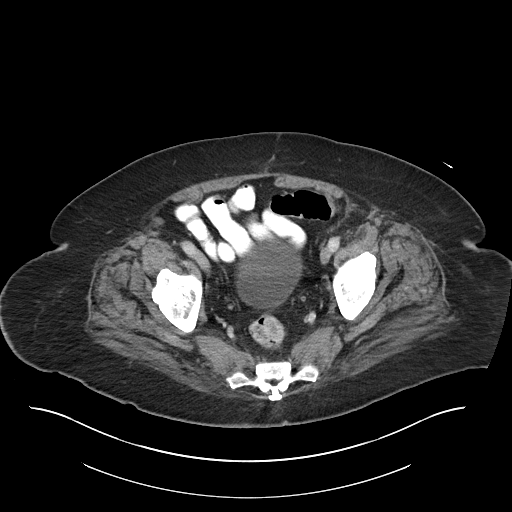
[im 35/103  soft-tissue]
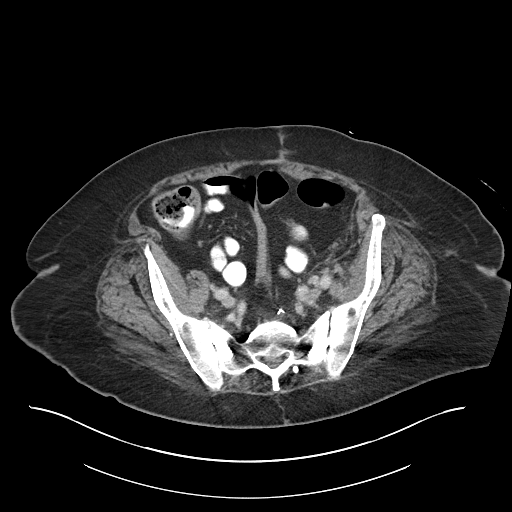
[im 41/103  soft-tissue]
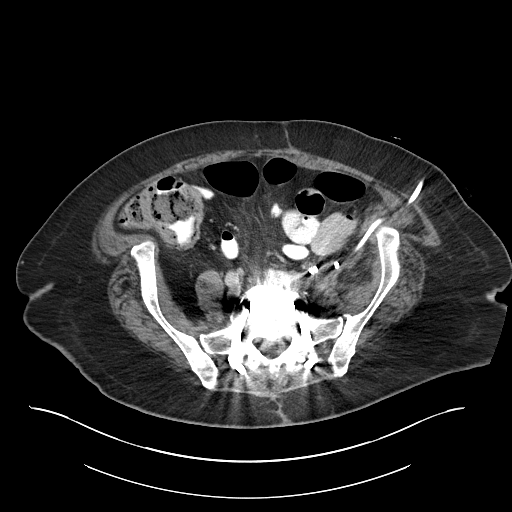
[im 55/103  soft-tissue]
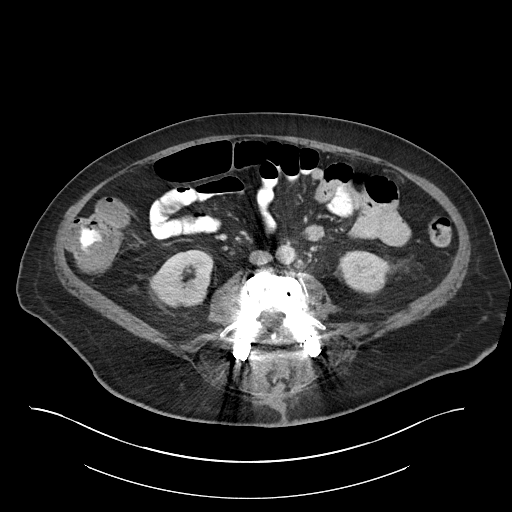
[im 62/103  soft-tissue]
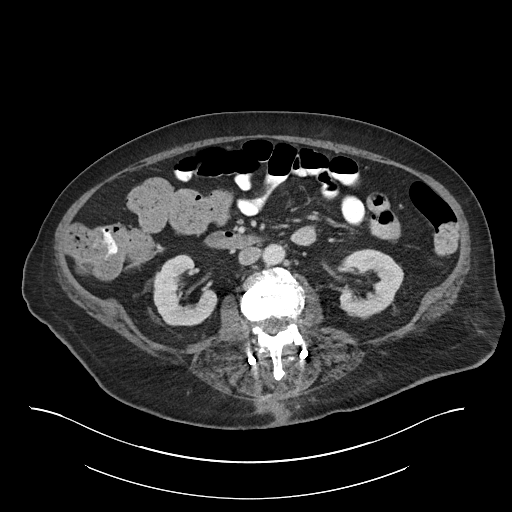
[im 69/103  soft-tissue]
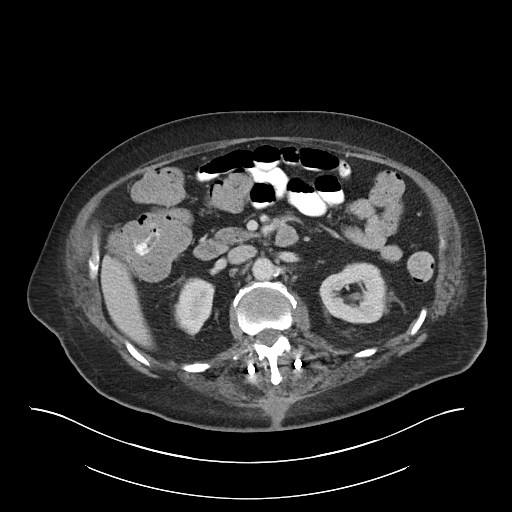
[im 69/103  bone]
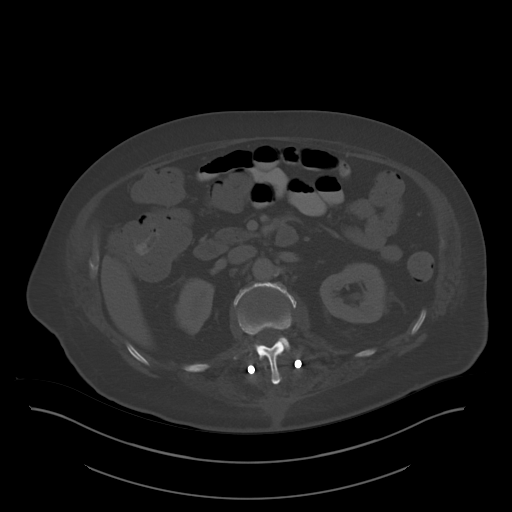
[im 75/103  soft-tissue]
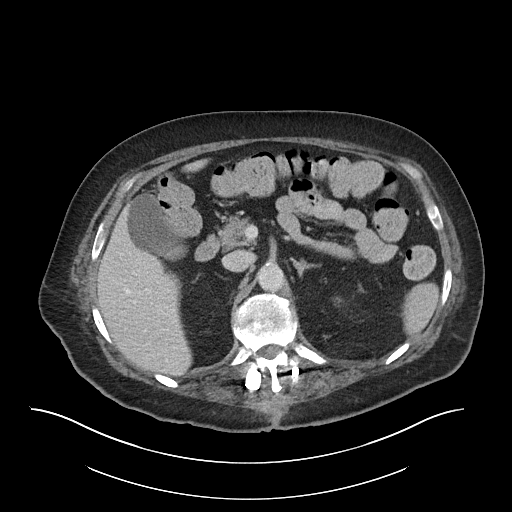
[im 82/103  soft-tissue]
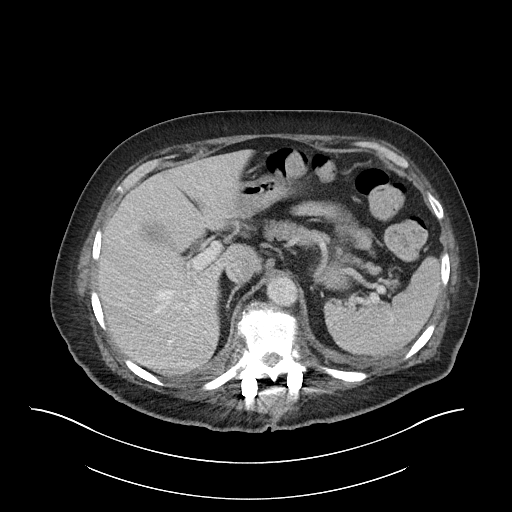
[im 89/103  soft-tissue]
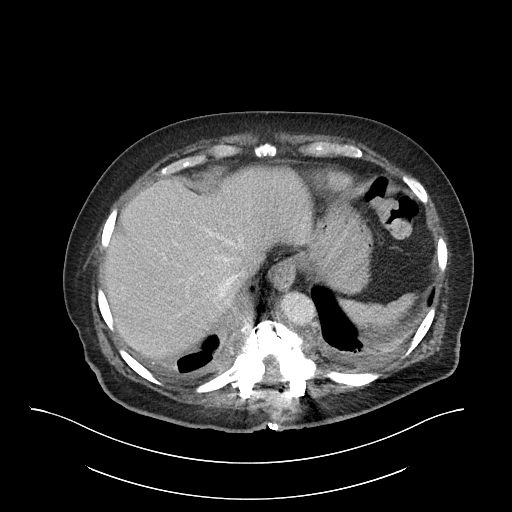
[im 96/103  soft-tissue]
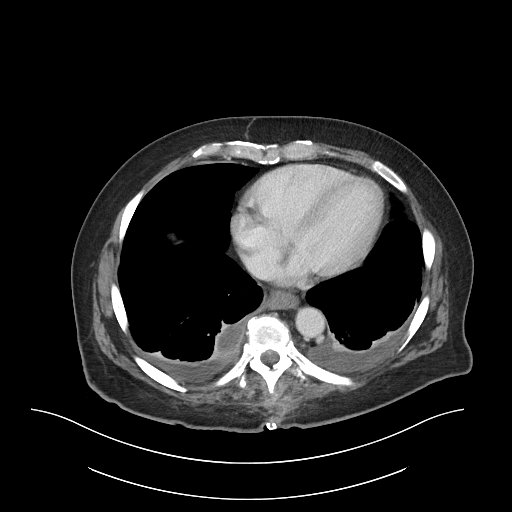

[Series 6: abdomen 3.0 mpr cor · coronal · 0.96mm/px · 3 of 103 slices shown]
[im 35/103  soft-tissue]
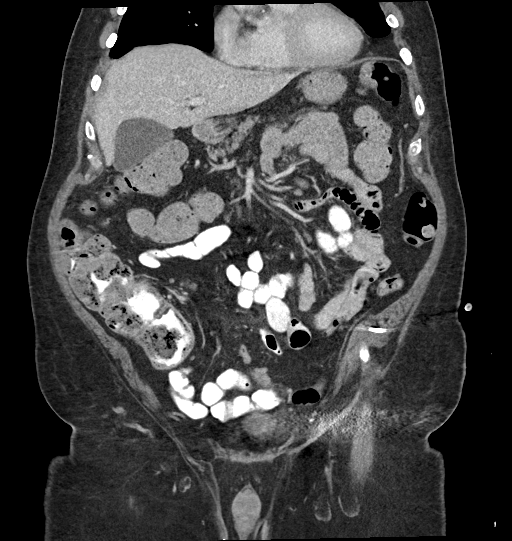
[im 46/103  soft-tissue]
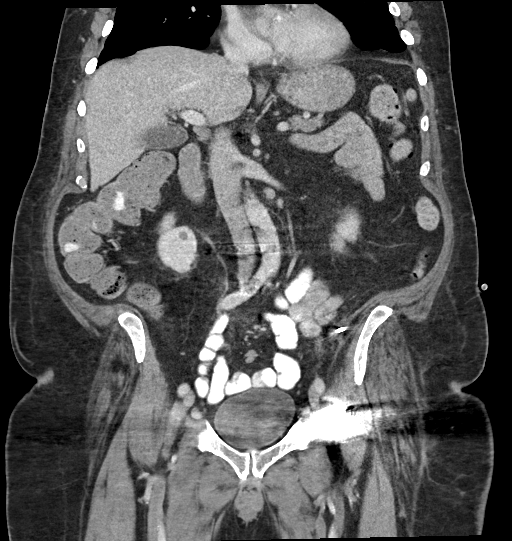
[im 57/103  soft-tissue]
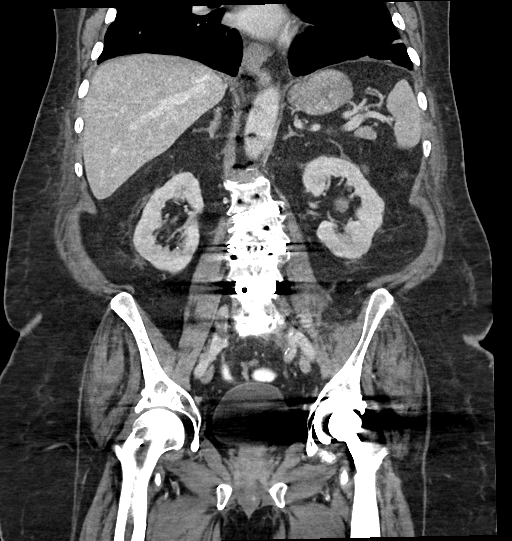

[16 of 46 positions shown; findings below may reference images not displayed]

FINDINGS: Lower chest: Small bilateral pleural effusions. Mild dependent lower
lobe in lung base atelectasis. Findings similar to the prior study.

Hepatobiliary: No focal liver abnormality is seen. No gallstones,
gallbladder wall thickening, or biliary dilatation.

Pancreas: Unremarkable. No pancreatic ductal dilatation or
surrounding inflammatory changes.

Spleen: Normal in size without focal abnormality.

Adrenals/Urinary Tract: No adrenal masses. Kidneys normal in size,
orientation and position. Subcentimeter low-attenuation lesion,
anterior midpole of the right kidney, likely a cyst, unchanged. No
other renal masses or lesions, no stones and no hydronephrosis.
Normal ureters. Normal bladder.

Stomach/Bowel: Unremarkable stomach. Small bowel and colon are
normal in caliber. No wall thickening or inflammation.

Vascular/Lymphatic: Minor aortic atherosclerosis. No aneurysm. No
enlarged lymph nodes.

Reproductive: Normal size prostate.

Other: Pigtail catheter has been inserted since the prior study,
decompressing the collection anterior to the left iliac vessels.
There is mild adjacent stranding, also improved. No new abdominal or
pelvic fluid collections. No ascites.

Musculoskeletal: Extensive postsurgical changes with fusion from the
lower thoracic spine through the upper sacrum. Findings consistent
with discitis/osteomyelitis of the lower thoracic spine, similar to
the prior CT.
IMPRESSION: 1. Left pelvic collection has been decompressed with a percutaneous
pigtail catheter. Adjacent inflammatory changes have decreased from
the prior CT.
2. No new or acute abnormalities within the abdomen or pelvis.
3. Changes of discitis/osteomyelitis of the lower thoracic spine are
similar to the prior CT.

## 2021-03-21 IMAGING — MR MR THORACIC SPINE WO/W CM
15 of 24 series · 18 of 48 positions shown · IV contrast (gadavist)
Comparison: Previous MRI from [DATE].

CLINICAL DATA: Follow-up examination for epidural abscess.

EXAM:
MRI THORACIC AND LUMBAR SPINE WITHOUT AND WITH CONTRAST
TECHNIQUE: Multiplanar and multiecho pulse sequences of the thoracic and lumbar
spine were obtained without and with intravenous contrast.
CONTRAST:  10mL GADAVIST GADOBUTROL 1 MMOL/ML IV SOLN

[Series 12: T1 · sagittal · 5.0mm · 1.46mm/px · 1 of 9 slices shown (1 of 7)]
[im 1/9]
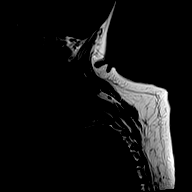

[Series 13: T1 · sagittal · 5.0mm · 1.23mm/px · 1 of 9 slices shown (2 of 7)]
[im 1/9]
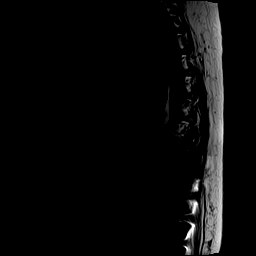

[Series 14: T1 · sagittal · 6.0mm · 1.23mm/px · 1 of 9 slices shown (3 of 7)]
[im 1/9]
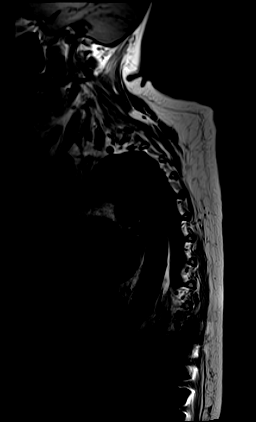

[Series 14: T1 · sagittal · 6.0mm · 1.23mm/px · 1 of 9 slices shown (4 of 7)]
[im 1/9]
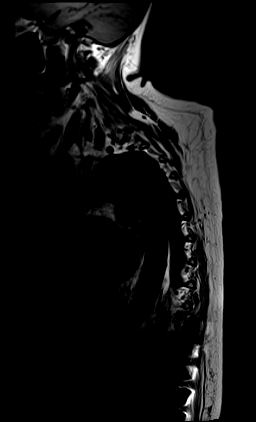

[Series 15: T2 · sagittal · 3.0mm · 0.76mm/px · 1 of 20 slices shown (1 of 4)]
[im 1/20]
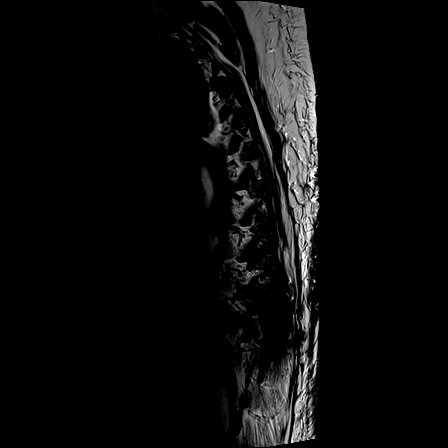

[Series 15: T2 · sagittal · 3.0mm · 0.76mm/px · 1 of 20 slices shown (2 of 4)]
[im 1/20]
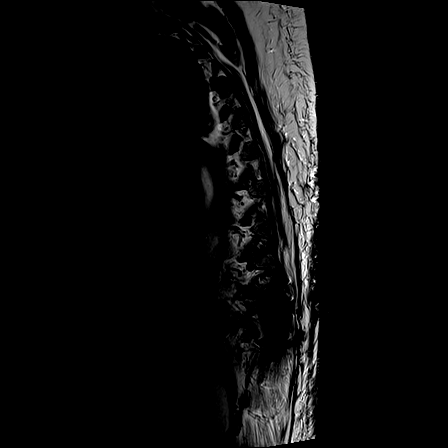

[Series 16: STIR · sagittal · 3.0mm · 0.38mm/px · 1 of 20 slices shown (1 of 2)]
[im 1/20]
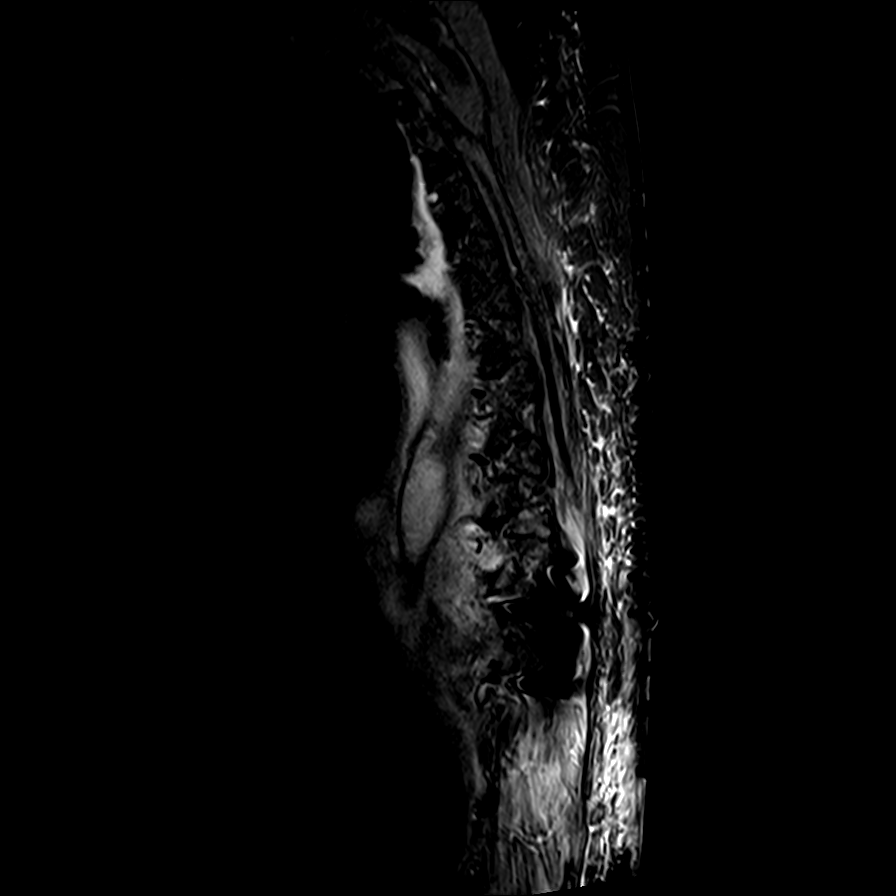

[Series 16: STIR · sagittal · 3.0mm · 0.38mm/px · 1 of 20 slices shown (2 of 2)]
[im 1/20]
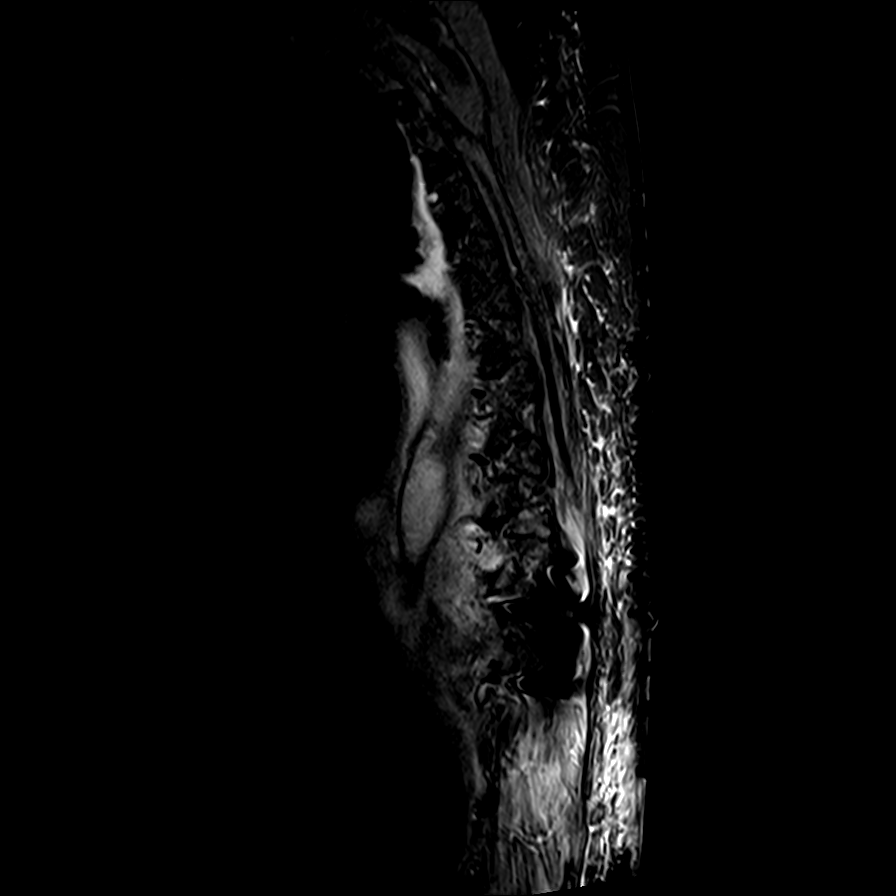

[Series 17: T1 · sagittal · 3.0mm · 0.76mm/px · 1 of 20 slices shown (5 of 7)]
[im 1/20]
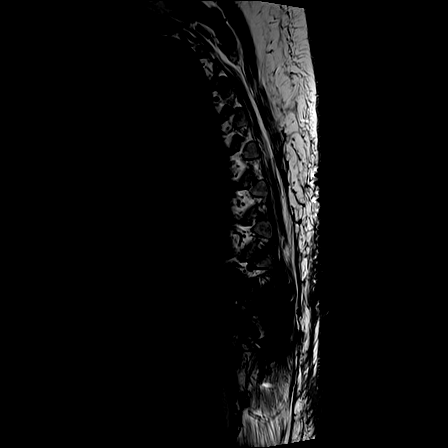

[Series 17: T1 · sagittal · 3.0mm · 0.76mm/px · 1 of 20 slices shown (6 of 7)]
[im 1/20]
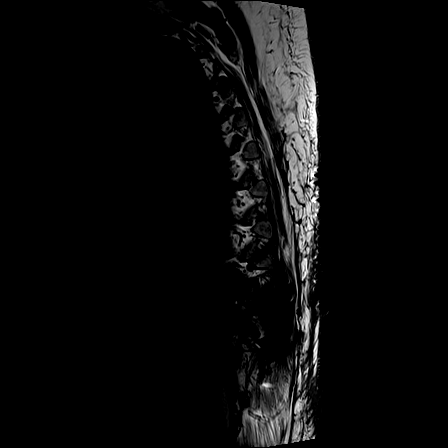

[Series 18: T2 · axial · 4.0mm · 0.59mm/px · z∈[-258,-36]mm · 2 of 36 slices shown (3 of 4)]
[im 1/36]
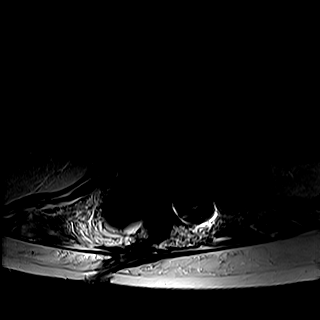
[im 36/36]
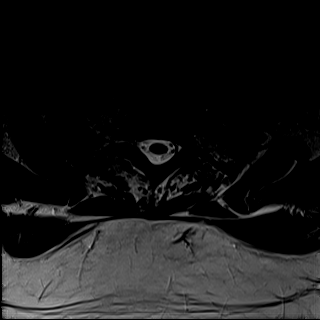

[Series 19: T2 · axial · 4.0mm · 0.59mm/px · z∈[-258,-36]mm · 2 of 39 slices shown (4 of 4)]
[im 1/39]
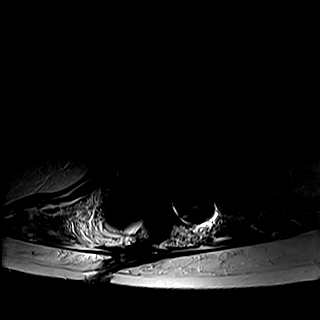
[im 39/39]
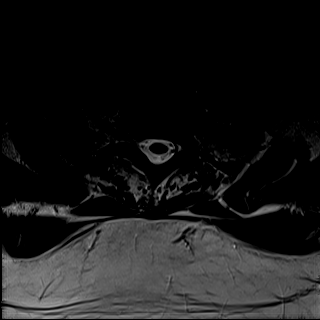

[Series 21: T1 · axial · non-contrast · 4.0mm · 0.31mm/px · z∈[-258,-36]mm · 2 of 39 slices shown (7 of 7)]
[im 1/39]
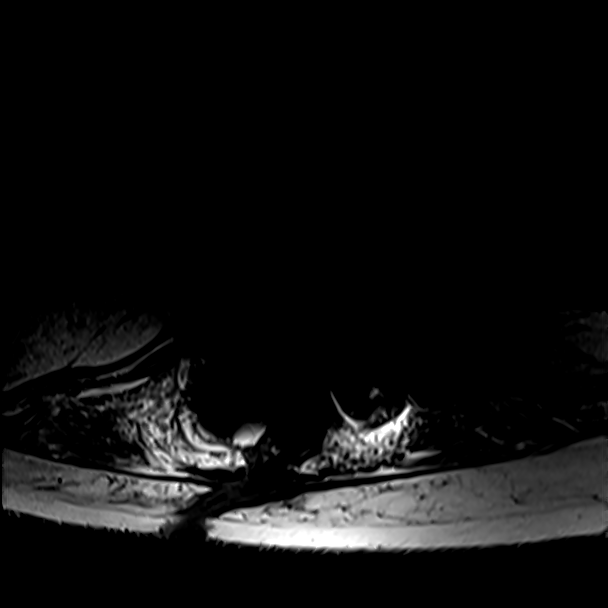
[im 39/39]
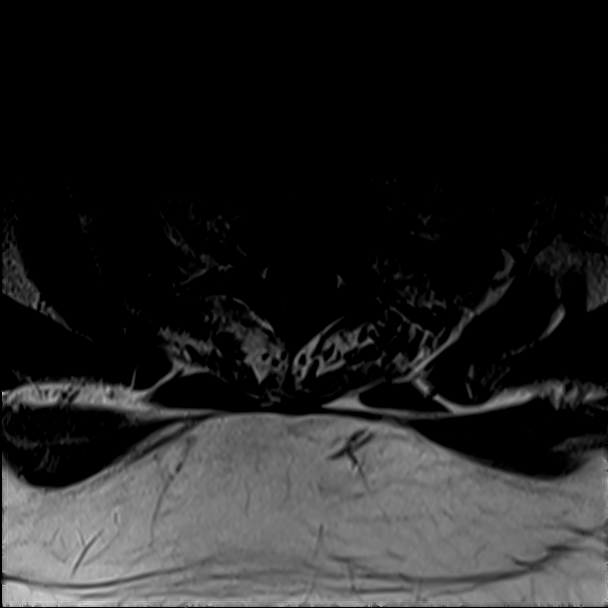

[Series 22: T1 fat-sat post-contrast · sagittal · 3.0mm · 0.76mm/px · 1 of 20 slices shown]
[im 1/20]
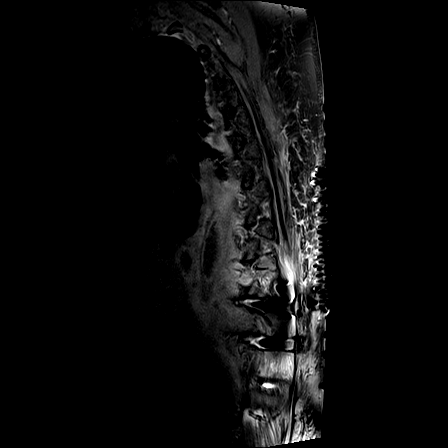

[Series 23: T1 post-contrast · axial · 4.0mm · 0.31mm/px · 1 of 36 slices shown]
[im 1/36]
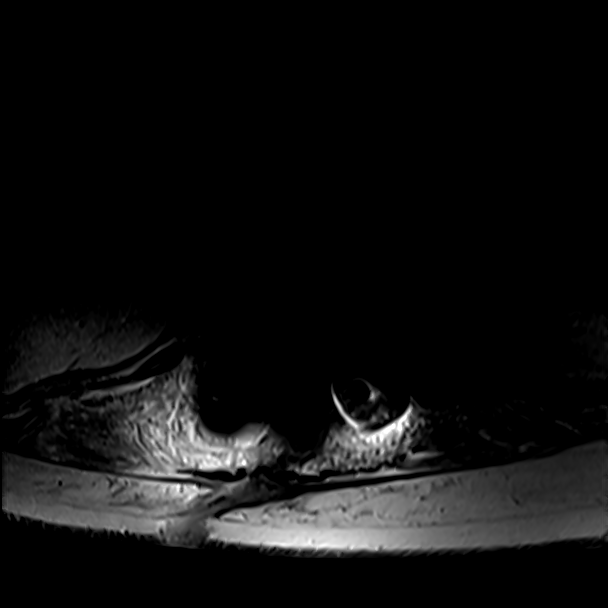

[18 of 48 positions shown; findings below may reference images not displayed]

FINDINGS: MRI THORACIC SPINE FINDINGS

Alignment: Mildly increased lower thoracic kyphosis, with trace
retrolisthesis of T11 on T12 and T12 on L1. Trace anterolisthesis of
T9 on T10. Alignment is stable from prior.

Vertebrae: Postoperative changes from interval decompressive
laminectomy at T9 and T10 is seen. Small collection at the operative
site measuring 1.4 x 2.5 cm without mass effect, consistent with a
likely postoperative seroma. Overall, the operative site is
satisfactory in appearance without complication. Posterior fusion
extending from T10 through the upper lumbar spine again seen.

Persistent findings of osteomyelitis discitis at T9-10 and T10-11
with persistent marrow edema throughout the T9, T10, and T11
vertebral bodies. Persistent increased fluid signal intensity within
the T9-10 and T10-11 disc spaces. Associated endplate erosion is not
significantly changed or progressed from prior. Associated anterior
height loss at the T11 vertebral body, and to a lesser extent the
anterior aspect of the T9 inferior endplate, stable. Persistent
ventral epidural abscess/phlegmon seen extending from T8 through
T11. This measures up to 9 mm in maximal AP diameter at the level of
T10. Overall size of this collection appears somewhat decreased,
with less mass effect and posterior bowing of the thoracic cord at
this level. Enhancement involving the posterior thecal sac extending
from T7 through the operative site could reflect additional epidural
infection and/or postoperative change. The thecal sac has been well
decompressed at the laminectomy site, however, there remains
persistent fairly severe spinal stenosis just below the operative
site at the level of T10-11 (series 16, image 11), slightly worsened
in appearance from previous. For example, the thecal sac now
measures 5 mm in AP diameter at its most narrow point, previously 6
mm on [DATE]. Associated moderate cord flattening at this level
with no definite or convincing cord signal changes.

Cord: Moderate cord flattening at the level of T10-11 without
convincing cord signal changes. Slightly improved mass effect by the
ventral epidural collection at the levels of T8 through T10.

Paraspinal and other soft tissues: Normal expected postoperative
changes within the lower posterior paraspinous soft tissues.
Persistent irregular paraspinous edema/phlegmon about the lower
thoracic spine. Irregular loculated bilateral pleural collections
are similar, likely empyema.

Disc levels:

Postoperative and infectious findings in the lower thoracic spine as
detailed above. Minor spondylosis and moderate facet arthrosis
elsewhere within the thoracic spine without significant stenosis.

MRI LUMBAR SPINE FINDINGS

Segmentation:  Standard.

Alignment: Sigmoid scoliotic curvature. Chronic grade 1
retrolisthesis of L1 on L2, with grade 1 anterolisthesis of L3 on L4
and L4 on L5, stable.

Vertebrae: Extensive postoperative changes extending from T10
through the sacrum. Chronic height loss at L2, stable. No new or
interval fracture. No evidence for new or progressive osteomyelitis
discitis or other infection within the lumbar spine.

Conus medullaris: Extends to the L1-2 level and appears normal. No
abnormal enhancement or epidural collections within the lumbar
spine.

Paraspinal and other soft tissues: Normal expected postoperative
changes present within the posterior paraspinous soft tissues. No
new loculated collections.

Disc levels:

Extensive postoperative changes with underlying residual multilevel
spondylosis and facet arthrosis, grossly stable and not
significantly changed from previous. No significant spinal stenosis
within the lumbar spine. Residual multilevel foraminal narrowing is
grossly stable.
IMPRESSION: 1. Postoperative changes from interval decompressive laminectomy at
T9 and T10. Satisfactory appearance of the laminectomy site, with
improved stenosis at these levels. However, there is persistent
severe spinal stenosis just below the operative site at the level of
T10-11 with moderate cord flattening, slightly worsened as compared
to [DATE]. No definite or convincing cord signal changes at this
time.
2. Persistent findings of osteomyelitis discitis at T9-10 and
T10-11. Associated ventral epidural abscess extending from T8
through T11 is overall slightly decreased in size as compared to
previous.
3. Persistent paravertebral inflammation and phlegmon, with
persistent irregular loculated bilateral pleural
collections/empyema.
4. No other new or progressive findings elsewhere within the
thoracolumbar spine.

## 2021-03-21 MED ORDER — IOHEXOL 9 MG/ML PO SOLN
ORAL | Status: AC
  Administered 2021-03-21: 500 mL
  Filled 2021-03-21: qty 1000

## 2021-03-21 MED ORDER — SORBITOL 70 % SOLN
960.0000 mL | TOPICAL_OIL | Freq: Once | ORAL | Status: AC
  Administered 2021-03-21: 960 mL via RECTAL
  Filled 2021-03-21: qty 473

## 2021-03-21 MED ORDER — IOHEXOL 350 MG/ML SOLN
100.0000 mL | Freq: Once | INTRAVENOUS | Status: AC | PRN
  Administered 2021-03-21: 100 mL via INTRAVENOUS

## 2021-03-21 MED ORDER — GADOBUTROL 1 MMOL/ML IV SOLN
10.0000 mL | Freq: Once | INTRAVENOUS | Status: AC | PRN
  Administered 2021-03-21: 10 mL via INTRAVENOUS

## 2021-03-21 MED ORDER — LORAZEPAM 0.5 MG PO TABS
0.5000 mg | ORAL_TABLET | Freq: Once | ORAL | Status: AC
  Administered 2021-03-21: 0.5 mg via ORAL
  Filled 2021-03-21: qty 1

## 2021-03-21 MED ORDER — BISACODYL 5 MG PO TBEC
10.0000 mg | DELAYED_RELEASE_TABLET | Freq: Every day | ORAL | Status: DC
  Administered 2021-03-21 – 2021-03-23 (×3): 10 mg via ORAL
  Filled 2021-03-21 (×4): qty 2

## 2021-03-21 NOTE — Plan of Care (Signed)

## 2021-03-21 NOTE — Progress Notes (Signed)
HD#6 SUBJECTIVE:  Patient Summary: Marco Kennedy is a 70 y.o. with a pertinent PMH of chronic low back pain and hypertension, who presented for MRI follow-up and had worsening lower back pain and admitted for epidural abscess.   Overnight Events: None  Interim History: Patient endorses continued pain, however states that he thinks he must of found a comfortable position while he was sleeping as he was able to get 3 consecutive hours of sleep overnight.  He is anxious about his scans today.  He does ask who to contact if he feels like his pain medication regiment is not adequately relieving his pain, and we told him that he was welcome to have his nurse reach out to the medicine team to help manage this.  He has yet to have a bowel movement with increased bowel regimen yesterday.  OBJECTIVE:  Vital Signs: Vitals:   03/20/21 1611 03/20/21 1952 03/20/21 2323 03/21/21 0340  BP: 133/65 131/84 128/65 (!) 141/80  Pulse: 79 (!) 103 92 100  Resp: 14 18 18 18   Temp: 97.7 F (36.5 C) 98.1 F (36.7 C) 98.9 F (37.2 C) 97.7 F (36.5 C)  TempSrc: Oral Oral Oral Oral  SpO2: 97% 97% 97% 99%  Weight:      Height:       Supplemental O2: Room Air SpO2: 99 % O2 Flow Rate (L/min): 2 L/min  Filed Weights   03/15/21 0014  Weight: 106.1 kg     Intake/Output Summary (Last 24 hours) at 03/21/2021 0724 Last data filed at 03/21/2021 0404 Gross per 24 hour  Intake 300.65 ml  Output 500 ml  Net -199.35 ml   Net IO Since Admission: -2,107.85 mL [03/21/21 0724]  Physical Exam: Constitutional: Elderly gentleman laying in bed.  No acute distress noted. Cardio: Regular rate and rhythm.  No murmurs, rubs, gallops. Pulm: Clear to auscultation bilaterally.  Normal work of breathing on room air. Abdomen: Soft, nontender, nondistended. MSK: No edema to bilateral lower extremities. Skin: Warm and dry. Neuro: Alert and oriented x3.  No focal deficit noted. Psych: Appropriate mood and  affect.   Patient Lines/Drains/Airways Status     Active Line/Drains/Airways     Name Placement date Placement time Site Days   Peripheral IV 03/14/21 20 G 1.88" Anterior;Left;Proximal Forearm 03/14/21  1549  Forearm  7   PICC Single Lumen 01/15/21 Right Basilic 39 cm 0 cm 01/15/21  01/17/21  Basilic  65   Closed System Drain 1 Left Flank Bulb (JP) 12 Fr. 01/27/21  1407  Flank  53   Closed System Drain Lateral LLQ Bulb (JP) 10 Fr. 03/16/21  1335  LLQ  5   Incision (Closed) 04/18/20 Flank Left 04/18/20  1419  -- 337   Incision (Closed) 01/13/21 Back 01/13/21  1016  -- 67   Incision (Closed) 03/14/21 Back 03/14/21  2006  -- 7   Wound / Incision (Open or Dehisced) 01/29/21 Non-pressure wound;Other (Comment) Buttocks Left 01/29/21  0900  Buttocks  51             ASSESSMENT/PLAN:  Assessment: Active Problems:   Vertebral osteomyelitis (HCC)   Empyema (HCC)   Sepsis due to Escherichia coli (E. coli) (HCC)   Retroperitoneal fluid collection   Epidural abscess   Discitis of thoracic region   Plan: #Epidural abscess Fluid culture and blood cultures continue to shows no growth to date.  He continues to endorse significant pain.   -Neurosurgery on board, appreciate their recommendations. -Patient is on  bed rest with exception of mobilization to bedside commode.  Continue to work with PT in bed. -TSLO brace when out of bed and as needed -Repeat MRI thoracic and lumbar spine, CT abdomen/pelvis today -He will need repeat surgery to thoracic spine after infection is cleared -ID on board, appreciate their recommendations -Per ID, timing of new hardware placement is after 1 to 2 weeks of antibiotic treatment without worsening of disease, starting from date of last surgery on 9/17             -Continue cefazolin 2 g IV every 8 hours -Trend inflammatory markers every 48 hours  #Constipation Patient initially had concerns regarding not having bowel movements early in his admission however  did not want scheduled bowel regimen.  He had MiraLAX as needed.  9/23 he reported continued lack of bowel movements and requested an increased bowel regimen.  He was given 1 dose of milk of magnesia 15 mL, and has Senokot 2 tablets daily as well as MiraLAX as part of his regimen at this time.  Unfortunately pain management with opioids is not helping with his constipation. -Dulcolax 10 mg daily added to regimen   #Hypertension Chronically managed.  BP of 154/79 today. -Continue amlodipine 5 mg daily   #Retroperitoneal fluid collection Noted on imaging done at admission.  S/P IR aspiration and drain placement.   -Monitor  Best Practice: Diet: Regular diet IVF: Fluids: None VTE: enoxaparin (LOVENOX) injection 40 mg Start: 03/20/21 0951 SCDs Start: 03/14/21 1708 Code: Full AB: Cefazolin 2 g IV every 8 hours DISPO: Continue to monitor inpatient.  Signature: Champ Mungo, D.O.  Internal Medicine Resident, PGY-1 [facility]  Pager: [phone] 7:24 AM, 03/21/2021   Please contact the on call pager after 5 pm and on weekends at [phone].

## 2021-03-21 NOTE — Progress Notes (Signed)
[name], male   DOB: [birth year], 70 y.o.   MRN: [id number] BP 124/77 (BP Location: Left Arm)   Pulse 82   Temp 98.4 F (36.9 C) (Oral)   Resp 20   Ht 5\' 7"  (1.702 m)   Wt 106.1 kg   SpO2 100%   BMI 36.65 kg/m  MRI thoracic spine shows continued epidural mass, with stenosis just below operative field. The laminectomy site looks very good and is well decompressed He is moving both lower extremities well Remains painful in lower back and with lower extremity movement

## 2021-03-21 NOTE — Plan of Care (Signed)

## 2021-03-22 DIAGNOSIS — M4620 Osteomyelitis of vertebra, site unspecified: Secondary | ICD-10-CM | POA: Diagnosis not present

## 2021-03-22 DIAGNOSIS — K5903 Drug induced constipation: Secondary | ICD-10-CM | POA: Diagnosis not present

## 2021-03-22 DIAGNOSIS — G062 Extradural and subdural abscess, unspecified: Secondary | ICD-10-CM | POA: Diagnosis not present

## 2021-03-22 DIAGNOSIS — M4644 Discitis, unspecified, thoracic region: Secondary | ICD-10-CM | POA: Diagnosis not present

## 2021-03-22 DIAGNOSIS — R188 Other ascites: Secondary | ICD-10-CM | POA: Diagnosis not present

## 2021-03-22 LAB — BASIC METABOLIC PANEL
Anion gap: 8 (ref 5–15)
BUN: 10 mg/dL (ref 8–23)
CO2: 32 mmol/L (ref 22–32)
Calcium: 9.4 mg/dL (ref 8.9–10.3)
Chloride: 94 mmol/L — ABNORMAL LOW (ref 98–111)
Creatinine, Ser: 0.8 mg/dL (ref 0.61–1.24)
GFR, Estimated: >60 mL/min (ref >60)
Glucose, Bld: 128 mg/dL — ABNORMAL HIGH (ref 70–99)
Potassium: 5 mmol/L (ref 3.5–5.1)
Sodium: 134 mmol/L — ABNORMAL LOW (ref 135–145)

## 2021-03-22 LAB — CBC WITH DIFFERENTIAL/PLATELET
Abs Immature Granulocytes: 0.05 10*3/uL (ref 0.00–0.07)
Basophils Absolute: 0.1 10*3/uL (ref 0.0–0.1)
Basophils Relative: 1 %
Eosinophils Absolute: 0.3 10*3/uL (ref 0.0–0.5)
Eosinophils Relative: 4 %
HCT: 34.8 % — ABNORMAL LOW (ref 39.0–52.0)
Hemoglobin: 10.9 g/dL — ABNORMAL LOW (ref 13.0–17.0)
Immature Granulocytes: 1 %
Lymphocytes Relative: 11 %
Lymphs Abs: 1 10*3/uL (ref 0.7–4.0)
MCH: 26.8 pg (ref 26.0–34.0)
MCHC: 31.3 g/dL (ref 30.0–36.0)
MCV: 85.7 fL (ref 80.0–100.0)
Monocytes Absolute: 0.9 10*3/uL (ref 0.1–1.0)
Monocytes Relative: 10 %
Neutro Abs: 6.5 10*3/uL (ref 1.7–7.7)
Neutrophils Relative %: 73 %
Platelets: 465 10*3/uL — ABNORMAL HIGH (ref 150–400)
RBC: 4.06 MIL/uL — ABNORMAL LOW (ref 4.22–5.81)
RDW: 14.1 % (ref 11.5–15.5)
WBC: 8.8 10*3/uL (ref 4.0–10.5)
nRBC: 0 % (ref 0.0–0.2)

## 2021-03-22 MED ORDER — LACTULOSE ENEMA
300.0000 mL | Freq: Every day | ORAL | Status: DC | PRN
  Filled 2021-03-22 (×2): qty 300

## 2021-03-22 NOTE — Progress Notes (Signed)
HD#7 SUBJECTIVE:  Patient Summary: Marco Kennedy is a 70 y.o. with a pertinent PMH of chronic low back pain and hypertension, who presented for MRI follow-up and had worsening lower back pain and admitted for epidural abscess.   Overnight Events: None  Interim History: Patient endorses persistent pain.  He states that his pain was worse after undergoing MRI yesterday.  Patient's MRI shows persistent epiduralabscess with osteomyelitis discitis at T9-10 and T10-11. He also has persistent severe spinal stenosis with moderate cord flattening from T10-T11. His lower extremity numbness is stable and his ability to move his legs is the same.  He is able to eat some without difficulty. He has not had a bowel movement in about 8 days. He had an enema yesterday with minimal benefit.   OBJECTIVE:  Vital Signs: Vitals:   03/21/21 1404 03/21/21 1855 03/21/21 2334 03/22/21 0347  BP: (!) 149/87 124/77 (!) 126/93 110/74  Pulse: 87 82 88 (!) 103  Resp: 18 20 16 17   Temp: 97.9 F (36.6 C) 98.4 F (36.9 C) 98.4 F (36.9 C) 98.2 F (36.8 C)  TempSrc: Oral Oral Oral Oral  SpO2: 97% 100% 97% 97%  Weight:      Height:       Supplemental O2: Room Air SpO2: 97 % O2 Flow Rate (L/min): 2 L/min  Filed Weights   03/15/21 0014  Weight: 106.1 kg     Intake/Output Summary (Last 24 hours) at 03/22/2021 1051 Last data filed at 03/22/2021 1037 Gross per 24 hour  Intake 5 ml  Output 2210 ml  Net -2205 ml    Net IO Since Admission: -4,462.85 mL [03/22/21 1051]  Physical Exam: Constitutional: Elderly gentleman laying in bed.  No acute distress noted. Cardio: Regular rate and rhythm.  No murmurs, rubs, gallops. Pulm: Clear to auscultation bilaterally.  Normal work of breathing on room air. Abdomen: Soft, nontender, nondistended. MSK: No edema to bilateral lower extremities. Skin: Warm and dry. Neuro: Alert and oriented x3.  Decreased dorsiflexion of L foot, decreased sensation of bilateral lower  extremities, stable to minimally improved from day prior exam Psych: Appropriate mood and affect.   Patient Lines/Drains/Airways Status     Active Line/Drains/Airways     Name Placement date Placement time Site Days   Peripheral IV 03/14/21 20 G 1.88" Anterior;Left;Proximal Forearm 03/14/21  1549  Forearm  7   PICC Single Lumen 01/15/21 Right Basilic 39 cm 0 cm 01/15/21  01/17/21  Basilic  65   Closed System Drain 1 Left Flank Bulb (JP) 12 Fr. 01/27/21  1407  Flank  53   Closed System Drain Lateral LLQ Bulb (JP) 10 Fr. 03/16/21  1335  LLQ  5   Incision (Closed) 04/18/20 Flank Left 04/18/20  1419  -- 337   Incision (Closed) 01/13/21 Back 01/13/21  1016  -- 67   Incision (Closed) 03/14/21 Back 03/14/21  2006  -- 7   Wound / Incision (Open or Dehisced) 01/29/21 Non-pressure wound;Other (Comment) Buttocks Left 01/29/21  0900  Buttocks  51             ASSESSMENT/PLAN:  Assessment: Active Problems:   Vertebral osteomyelitis (HCC)   Empyema (HCC)   Sepsis due to Escherichia coli (E. coli) (HCC)   Retroperitoneal fluid collection   Constipation   Epidural abscess   Discitis of thoracic region   Plan: #Epidural abscess Fluid culture and blood cultures continue to shows no growth to date.  He continues to endorse significant pain.  MRI performed 9/24 with findings concerning for persistent epidural abscess with associated osteomyelitis discitis at T9-T10 and T10-T11. -Neurosurgery on board, appreciate their recommendations. -Patient is on bed rest with exception of mobilization to bedside commode.  Continue to work with PT in bed. -TSLO brace when out of bed and as needed -Repeat MRI thoracic and lumbar spine, CT abdomen/pelvis today -He will need repeat surgery to thoracic spine after infection is cleared -ID on board, appreciate their recommendations -Per ID, timing of new hardware placement is after 1 to 2 weeks of antibiotic treatment without worsening of disease, starting from  date of last surgery on 9/17             -Continue cefazolin 2 g IV every 8 hours -Trend inflammatory markers every 48 hours  #Constipation Patient initially had concerns regarding not having bowel movements early in his admission however did not want scheduled bowel regimen.  He had MiraLAX as needed.  9/23 he reported continued lack of bowel movements and requested an increased bowel regimen.  He was given 1 dose of milk of magnesia 15 mL, and has Senokot 2 tablets daily as well as MiraLAX as part of his regimen at this time.  Unfortunately pain management with opioids is not helping with his constipation.  Patient received one-time enema to minimal effect. -Continue bowel regimen dulcolax, miralax, and senokot, lactulose enema PRN    #Hypertension Chronically managed.  Normotensive this AM.  -Continue amlodipine 5 mg daily   #Retroperitoneal fluid collection Noted on imaging done at admission.  S/P IR aspiration and drain placement.  Repeat imaging shows resolution of RP fluid collection.  -Monitor  Best Practice: Diet: Regular diet IVF: Fluids: None VTE: enoxaparin (LOVENOX) injection 40 mg Start: 03/20/21 0951 SCDs Start: 03/14/21 1708 Code: Full AB: Cefazolin 2 g IV every 8 hours DISPO: Continue to monitor inpatient.  Signature: Marolyn Haller, MD PGY-2 Internal Medicine  Pager [phone] 10:51 AM, 03/22/2021   Please contact the on call pager after 5 pm and on weekends at [phone].

## 2021-03-22 NOTE — Progress Notes (Signed)
Occupational Therapy Treatment Patient Details Name: Marco Kennedy MRN: [id number] DOB: Aug 07, 1950 Today's Date: 03/22/2021   History of present illness Pt is a 70 y.o. 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 He is now s/p T9-T10 laminectomy for decompression of thecal sac and debridement of abscess 9/18. Drain placed in left retroperitoneal space on 03/17/19 on bulb suction. Pt had back sx  01/2020 with Dr. Venetia Maxon; admission 7/16-22 for sepsis and again 7/26 for SOB and fever. PMHx: back sx, arthritis, degenerative lumbar spinal stenosis, THA 2017.   OT comments  OT treatment session with focus on bed mobility, static sitting tolerance/balance at EOB and BLE HEP. Patient in R side-lying upon entry. Reports decision to be made on further surgical management tomorrow. Patient able to come to sitting at EOB with +2 assist. 2/3 grooming tasks seated EOB with Min guard and unilateral UE support on bed surface. Unable to sit without UE support 2/2 pain. Patient required +2 assist for return to supine. Making slow progress toward goals. Will attempt to stand with mobility equipment at time of next treatment session.    Recommendations for follow up therapy are one component of a multi-disciplinary discharge planning process, led by the attending physician.  Recommendations may be updated based on patient status, additional functional criteria and insurance authorization.    Follow Up Recommendations  CIR    Equipment Recommendations  None recommended by OT    Recommendations for Other Services      Precautions / Restrictions Precautions Precautions: Fall;Back Precaution Booklet Issued: Yes (comment) Precaution Comments: bulb drain- abdomen, TLSO in room Required Braces or Orthoses: Spinal Brace Spinal Brace: Thoracolumbosacral orthotic;Other (comment) Spinal Brace Comments: per Dr Venetia Maxon note 9/23, does not need TLSO in supine,  only when OOB/EOB Restrictions Weight Bearing Restrictions: No       Mobility Bed Mobility Overal bed mobility: Needs Assistance Bed Mobility: Rolling Rolling: Mod assist;+2 for physical assistance;+2 for safety/equipment Sidelying to sit: Max assist;+2 for physical assistance;+2 for safety/equipment   Sit to supine: Mod assist;+2 for physical assistance;+2 for safety/equipment   General bed mobility comments: Multimodal cues for sequencing and for hand placement; increased pain with movement    Transfers                 General transfer comment: Unable to attempt this date 2/2 pain and BLE weakness.    Balance Overall balance assessment: Needs assistance Sitting-balance support: Bilateral upper extremity supported Sitting balance-Leahy Scale: Poor Sitting balance - Comments: Reliant on BUE supported on bed surface; increased pain with unilateral UE support during grooming tasks. Unable to maintain static sitting balance without UE support.                                   ADL either performed or assessed with clinical judgement   ADL Overall ADL's : Needs assistance/impaired Eating/Feeding: Set up;Bed level   Grooming: Min guard;Sitting Grooming Details (indicate cue type and reason): min guard for sitting balance during task Upper Body Bathing: Minimal assistance;Bed level   Lower Body Bathing: Maximal assistance;Bed level   Upper Body Dressing : Maximal assistance Upper Body Dressing Details (indicate cue type and reason): max A for donning TLSO in supine Lower Body Dressing: Maximal assistance;Bed level  Vision       Perception     Praxis      Cognition Arousal/Alertness: Awake/alert Behavior During Therapy: WFL for tasks assessed/performed;Anxious Overall Cognitive Status: Within Functional Limits for tasks assessed                                 General Comments: A&Ox4; able to recall  3/3 back precautions        Exercises Exercises: General Lower Extremity General Exercises - Lower Extremity Quad Sets: AAROM;Both;5 reps;Seated Straight Leg Raises: AAROM;Both;5 reps;Seated Hip Flexion/Marching: AAROM;Both;5 reps;Seated Toe Raises: Both;5 reps;Seated;Other (comment);AROM (Great difficulty noted) Heel Raises: AAROM;Both;5 reps;Seated   Shoulder Instructions       General Comments VSS on RA.    Pertinent Vitals/ Pain       Pain Assessment: Faces Faces Pain Scale: Hurts even more Pain Location: lower back, abdomen Pain Descriptors / Indicators: Discomfort;Grimacing;Guarding;Numbness Pain Intervention(s): Limited activity within patient's tolerance;Monitored during session;Premedicated before session;Repositioned  Home Living                                          Prior Functioning/Environment              Frequency  Min 2X/week        Progress Toward Goals  OT Goals(current goals can now be found in the care plan section)  Progress towards OT goals: Progressing toward goals  Acute Rehab OT Goals Patient Stated Goal: less pain OT Goal Formulation: With patient Time For Goal Achievement: 03/30/21 Potential to Achieve Goals: Fair ADL Goals Pt Will Perform Grooming: with supervision;standing Pt Will Perform Lower Body Bathing: with min assist;sit to/from stand Pt Will Perform Lower Body Dressing: with min assist;with adaptive equipment;sit to/from stand Pt Will Transfer to Toilet: with supervision;ambulating Pt Will Perform Toileting - Clothing Manipulation and hygiene: with modified independence;sitting/lateral leans  Plan Discharge plan remains appropriate;Frequency remains appropriate    Co-evaluation                 AM-PAC OT "6 Clicks" Daily Activity     Outcome Measure   Help from another person eating meals?: Total Help from another person taking care of personal grooming?: A Little Help from another  person toileting, which includes using toliet, bedpan, or urinal?: A Lot Help from another person bathing (including washing, rinsing, drying)?: A Lot Help from another person to put on and taking off regular upper body clothing?: A Little Help from another person to put on and taking off regular lower body clothing?: A Lot 6 Click Score: 13    End of Session Equipment Utilized During Treatment: Back brace  OT Visit Diagnosis: Unsteadiness on feet (R26.81);Other abnormalities of gait and mobility (R26.89);Muscle weakness (generalized) (M62.81);Repeated falls (R29.6);Pain Pain - Right/Left:  (Bilaterally R<L) Pain - part of body: Leg;Ankle and joints of foot (Low back)   Activity Tolerance Patient limited by pain   Patient Left in bed;with call bell/phone within reach;with bed alarm set   Nurse Communication Mobility status        Time: 6073-7106 OT Time Calculation (min): 25 min  Charges: OT General Charges $OT Visit: 1 Visit OT Treatments $Self Care/Home Management : 8-22 mins $Therapeutic Activity: 8-22 mins  Deion Forgue H. OTR/L Supplemental OT, Department of rehab services [phone]  Iverna Hammac R H. 03/22/2021, 12:18  PM

## 2021-03-22 NOTE — Progress Notes (Signed)
[facility]  Date of Admission:  03/14/2021     Lines:  7/21-c Right ue picc      Abx: 9/22-c cefazolin  9/19-22 ertapenem 9/17-19 vanc 9/17-19 cefepime   7/21-9/17 cefazolin (outpatient/previous admission)     Assessment/Plan 70 yo male previous thoracolumbar hardware/back surgery 03/2020 complicated by thoracic vertebral om/epidural abscess s/p recent I&D 7/19 with cx ecoli (bcx negative at that time), with subsequent bilateral empyema (fluid cx negative) and intraabd fluid collection (cx negative 8/02). Has had complex course requiring bilateral chest tube and abd collection drain (cx negative). The abd drain was removed 8/30 after 8/20 repeat ct showed improving abscess size. He was readmitted now 9/17 after worsening LE pain and also mri repeat 9/16 showing worsening thoracic epidural process along with increasing abd fluid collection.        #vertebral OM/epidural abscess -- hardware related #retroperitoneal fluid collection s/p drain removal 8/30 #exudative bilateral pleural effusion/empyema s/p bilateral chest tubes Initial epidural abscess I&D 7/19; cx ecoli Pleural fluid cx negative and abd abscess cx negative 7/27 tte no evidence endocarditis 9/16 mri thoracic spine worsening thoracic epidural process; also clinically worsening LE pain, s/p I&D 9/17 9/17 operative cx ngtd 9/17 admission bcx ngtd   In terms of microbiologic diagnosis, suspect this is hardware related issue being barrier to clearance of infection. In the lesser probability of abx lackign coverage, would worry more about resistance development rather than new organisms. Will switch abx to ertapenem pending cx result   Agree with new perc drain into abd abscess per IR  -------------- 9/25 assessment Abd abscess fluid and spinal surgical wound cx are negative, confirming that this is a burden of disease/source control issue in setting of metals/hardware. He had had  2 surgical procedures by now to debride his back so hopefully that will help.   Improving pain/left abd drain output  Ct repeat abd showed collapsed previous fluid collection, consistent with minimal to no further drain output  Mri t/l spine no new abscess  Crp 9/24      6.8 9/22    12 9/20    15  At this time, it is as best as he is in terms of infection control/treatment. Given the more serious morbidity/complication of being bed-bound for several weeks, reasonable to place new stabilization spine hardware next week   Plan: -continue cefazolin -please discuss with IR regarding further need for the percutaneous abdominal drain -reasonable to proceed to spine surgery next week if NSG need   I spent more than 35 minute reviewing data/chart, and coordinating care and >50% direct face to face time providing counseling/discussing diagnostics/treatment plan with patient     Active Problems:   Vertebral osteomyelitis (HCC)   Empyema (HCC)   Sepsis due to Escherichia coli (E. coli) (HCC)   Retroperitoneal fluid collection   Constipation   Epidural abscess   Discitis of thoracic region   No Known Allergies  Scheduled Meds:  acetaminophen  650 mg Oral Q6H   amLODipine  5 mg Oral Daily   bisacodyl  10 mg Oral Daily   Chlorhexidine Gluconate Cloth  6 each Topical Daily   enoxaparin (LOVENOX) injection  40 mg Subcutaneous Q24H   polyethylene glycol  17 g Oral Daily   senna-docusate  2 tablet Oral Daily   sodium chloride flush  5 mL Intracatheter Q8H   Continuous Infusions:   ceFAZolin (ANCEF) IV 2 g (03/22/21 5621)   PRN  Meds:.fentaNYL, HYDROmorphone (DILAUDID) injection, lactulose, midazolam, oxyCODONE   SUBJECTIVE: Pain back improving No n/v/diarrhea No rash No f/c Crp down trending  Jp drain LLQ minimal output  Reviewed imaging with patient   Review of Systems: ROS All other ROS was negative, except mentioned above     OBJECTIVE: Vitals:   03/21/21  1855 03/21/21 2334 03/22/21 0347 03/22/21 1150  BP: 124/77 (!) 126/93 110/74 127/78  Pulse: 82 88 (!) 103 97  Resp: 20 16 17 18   Temp: 98.4 F (36.9 C) 98.4 F (36.9 C) 98.2 F (36.8 C) 97.9 F (36.6 C)  TempSrc: Oral Oral Oral Oral  SpO2: 100% 97% 97% 97%  Weight:      Height:       Body mass index is 36.65 kg/m.  Physical Exam  General/constitutional: no distress, pleasant HEENT: Normocephalic, PER, Conj Clear, EOMI, Oropharynx clear Neck supple CV: rrr no mrg Lungs: clear to auscultation, normal respiratory effort Abd: Soft, Nontender - drain minimal output Ext: no edema Skin: No Rash Neuro: nonfocal MSK: back nontender; no erythema/fluctuance/dehiscence       Lab Results Lab Results  Component Value Date   WBC 8.8 03/22/2021   HGB 10.9 (L) 03/22/2021   HCT 34.8 (L) 03/22/2021   MCV 85.7 03/22/2021   PLT 465 (H) 03/22/2021    Lab Results  Component Value Date   CREATININE 0.80 03/22/2021   BUN 10 03/22/2021   NA 134 (L) 03/22/2021   K 5.0 03/22/2021   CL 94 (L) 03/22/2021   CO2 32 03/22/2021    Lab Results  Component Value Date   ALT 7 03/14/2021   AST 25 03/14/2021   ALKPHOS 79 03/14/2021   BILITOT 0.5 03/14/2021      Microbiology: Recent Results (from the past 240 hour(s))  Blood culture (routine x 2)     Status: None   Collection Time: 03/14/21  3:54 PM   Specimen: BLOOD  Result Value Ref Range Status   Specimen Description BLOOD SITE NOT SPECIFIED  Final   Special Requests   Final    BOTTLES DRAWN AEROBIC AND ANAEROBIC Blood Culture adequate volume   Culture   Final    NO GROWTH 5 DAYS Performed at [facility], [address] [place], [place]    Report Status 03/19/2021 FINAL  Final  Blood culture (routine x 2)     Status: None   Collection Time: 03/14/21  3:54 PM   Specimen: BLOOD  Result Value Ref Range Status   Specimen Description BLOOD LEFT ANTECUBITAL  Final   Special Requests   Final    BOTTLES DRAWN  AEROBIC AND ANAEROBIC Blood Culture adequate volume   Culture   Final    NO GROWTH 5 DAYS Performed at [facility], [address], [place], [place]    Report Status 03/19/2021 FINAL  Final  Resp Panel by RT-PCR (Flu A&B, Covid) Nasopharyngeal Swab     Status: None   Collection Time: 03/14/21  4:33 PM   Specimen: Nasopharyngeal Swab; Nasopharyngeal(NP) swabs in vial transport medium  Result Value Ref Range Status   SARS Coronavirus 2 by RT PCR NEGATIVE NEGATIVE Final    Comment: (NOTE) SARS-CoV-2 target nucleic acids are NOT DETECTED.  The SARS-CoV-2 RNA is generally detectable in upper respiratory specimens during the acute phase of infection. The lowest concentration of SARS-CoV-2 viral copies this assay can detect is 138 copies/mL. A negative result does not preclude SARS-Cov-2 infection and  should not be used as the sole basis for treatment or other patient management decisions. A negative result may occur with  improper specimen collection/handling, submission of specimen other than nasopharyngeal swab, presence of viral mutation(s) within the areas targeted by this assay, and inadequate number of viral copies(<138 copies/mL). A negative result must be combined with clinical observations, patient history, and epidemiological information. The expected result is Negative.  Fact Sheet for Patients:  BloggerCourse.com  Fact Sheet for Healthcare Providers:  SeriousBroker.it  This test is no t yet approved or cleared by the [place] FDA and  has been authorized for detection and/or diagnosis of SARS-CoV-2 by FDA under an Emergency Use Authorization (EUA). This EUA will remain  in effect (meaning this test can be used) for the duration of the COVID-19 declaration under Section 564(b)(1) of the Act, 21 U.S.C.section 360bbb-3(b)(1), unless the authorization is terminated  or revoked sooner.       Influenza A  by PCR NEGATIVE NEGATIVE Final   Influenza B by PCR NEGATIVE NEGATIVE Final    Comment: (NOTE) The Xpert Xpress SARS-CoV-2/FLU/RSV plus assay is intended as an aid in the diagnosis of influenza from Nasopharyngeal swab specimens and should not be used as a sole basis for treatment. Nasal washings and aspirates are unacceptable for Xpert Xpress SARS-CoV-2/FLU/RSV testing.  Fact Sheet for Patients: BloggerCourse.com  Fact Sheet for Healthcare Providers: SeriousBroker.it  This test is not yet approved or cleared by the [place] FDA and has been authorized for detection and/or diagnosis of SARS-CoV-2 by FDA under an Emergency Use Authorization (EUA). This EUA will remain in effect (meaning this test can be used) for the duration of the COVID-19 declaration under Section 564(b)(1) of the Act, 21 U.S.C. section 360bbb-3(b)(1), unless the authorization is terminated or revoked.  Performed at [facility], [address], [place], [place] [address]   Aerobic/Anaerobic Culture w Gram Stain (surgical/deep wound)     Status: None   Collection Time: 03/14/21  7:33 PM   Specimen: PATH Other; Tissue  Result Value Ref Range Status   Specimen Description ABSCESS  Final   Special Requests THORACIC 9 AND 10  Final   Gram Stain NO WBC SEEN NO ORGANISMS SEEN   Final   Culture   Final    No growth aerobically or anaerobically. Performed at [facility], [address], [place], [place] [address]    Report Status 03/19/2021 FINAL  Final  Aerobic/Anaerobic Culture w Gram Stain (surgical/deep wound)     Status: None   Collection Time: 03/16/21  1:57 PM   Specimen: Abscess  Result Value Ref Range Status   Specimen Description ABSCESS  Final   Special Requests ABDOMEN  Final   Gram Stain   Final    RARE WBC PRESENT, PREDOMINANTLY MONONUCLEAR NO ORGANISMS SEEN    Culture   Final    No growth aerobically or  anaerobically. Performed at [facility], [address], [place], [place] [address]    Report Status 03/21/2021 FINAL  Final     Serology:   Imaging: If present, new imagings (plain films, ct scans, and mri) have been personally visualized and interpreted; radiology reports have been reviewed. Decision making incorporated into the Impression / Recommendations.  9/17 abd pelv ct 1. 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 There is stable lucency surrounding the T10 pedicle screws likely secondary to infection. There is paravertebral soft tissue thickening without discrete fluid collection.  2. Left pelvic fluid collection has increased in size. The drain has been removed in the interval. Correlate clinically for abscess. 3. Loculated pleural fluid collection in the right lower hemithorax has slightly decreased in size, but has not resolved. Findings are likely related to patient's known empyema.  9/17 mri thoracic spine 1. Worsening findings of discitis-osteomyelitis at T9-10 and T10-11 with enlargement of the epidural abscess at these levels resulting in severe spinal stenosis at T10-11. No cord edema. 2. Persistent extensive paravertebral inflammation/phlegmon. Decreased size of loculated bilateral pleural collections.   9/24 t and L spine mri 1. Postoperative changes from interval decompressive laminectomy at T9 and T10. Satisfactory appearance of the laminectomy site, with improved stenosis at these levels. However, there is persistent severe spinal stenosis just below the operative site at the level of T10-11 with moderate cord flattening, slightly worsened as compared to 03/14/2021. No definite or convincing cord signal changes at this time. 2. 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 Associated ventral epidural abscess extending from T8 through T11 is overall slightly decreased in size as  compared to previous. 3. Persistent paravertebral inflammation and phlegmon, with persistent irregular loculated bilateral pleural collections/empyema. 4. No other new or progressive findings elsewhere within the thoracolumbar spine.  9/24 ct abd pelv 1. Left pelvic collection has been decompressed with a percutaneous pigtail catheter. Adjacent inflammatory changes have decreased from the prior CT. 2. No new or acute abnormalities within the abdomen or pelvis. 3. Changes of discitis/osteomyelitis of the lower thoracic spine are similar to the prior CT  Raymondo Band, MD [facility] [phone] pager    03/22/2021, 1:25 PM

## 2021-03-22 NOTE — Progress Notes (Signed)
Patient ID: HESTER FORGET, male   DOB: [birth year], 70 y.o.   MRN: [id number] BP 127/78 (BP Location: Left Arm)   Pulse 97   Temp 97.9 F (36.6 C) (Oral)   Resp 18   Ht 5\' 7"  (1.702 m)   Wt 106.1 kg   SpO2 97%   BMI 36.65 kg/m  Alert and oriented x 4,  Weakness left lower extremity, 4/5 Awaiting definitive procedure

## 2021-03-23 DIAGNOSIS — K5903 Drug induced constipation: Secondary | ICD-10-CM | POA: Diagnosis not present

## 2021-03-23 DIAGNOSIS — G062 Extradural and subdural abscess, unspecified: Secondary | ICD-10-CM | POA: Diagnosis not present

## 2021-03-23 DIAGNOSIS — M4620 Osteomyelitis of vertebra, site unspecified: Secondary | ICD-10-CM | POA: Diagnosis not present

## 2021-03-23 DIAGNOSIS — M4644 Discitis, unspecified, thoracic region: Secondary | ICD-10-CM | POA: Diagnosis not present

## 2021-03-23 LAB — CBC WITH DIFFERENTIAL/PLATELET
Abs Immature Granulocytes: 0.04 10*3/uL (ref 0.00–0.07)
Basophils Absolute: 0.1 10*3/uL (ref 0.0–0.1)
Basophils Relative: 1 %
Eosinophils Absolute: 0.4 10*3/uL (ref 0.0–0.5)
Eosinophils Relative: 4 %
HCT: 34.5 % — ABNORMAL LOW (ref 39.0–52.0)
Hemoglobin: 10.9 g/dL — ABNORMAL LOW (ref 13.0–17.0)
Immature Granulocytes: 0 %
Lymphocytes Relative: 15 %
Lymphs Abs: 1.5 10*3/uL (ref 0.7–4.0)
MCH: 26.9 pg (ref 26.0–34.0)
MCHC: 31.6 g/dL (ref 30.0–36.0)
MCV: 85.2 fL (ref 80.0–100.0)
Monocytes Absolute: 0.8 10*3/uL (ref 0.1–1.0)
Monocytes Relative: 8 %
Neutro Abs: 6.8 10*3/uL (ref 1.7–7.7)
Neutrophils Relative %: 72 %
Platelets: 453 10*3/uL — ABNORMAL HIGH (ref 150–400)
RBC: 4.05 MIL/uL — ABNORMAL LOW (ref 4.22–5.81)
RDW: 14 % (ref 11.5–15.5)
WBC: 9.5 10*3/uL (ref 4.0–10.5)
nRBC: 0 % (ref 0.0–0.2)

## 2021-03-23 LAB — BASIC METABOLIC PANEL
Anion gap: 9 (ref 5–15)
BUN: 14 mg/dL (ref 8–23)
CO2: 30 mmol/L (ref 22–32)
Calcium: 8.9 mg/dL (ref 8.9–10.3)
Chloride: 95 mmol/L — ABNORMAL LOW (ref 98–111)
Creatinine, Ser: 0.67 mg/dL (ref 0.61–1.24)
GFR, Estimated: >60 mL/min (ref >60)
Glucose, Bld: 136 mg/dL — ABNORMAL HIGH (ref 70–99)
Potassium: 3.9 mmol/L (ref 3.5–5.1)
Sodium: 134 mmol/L — ABNORMAL LOW (ref 135–145)

## 2021-03-23 LAB — MAGNESIUM: Magnesium: 2 mg/dL (ref 1.7–2.4)

## 2021-03-23 LAB — C-REACTIVE PROTEIN: CRP: 6.2 mg/dL — ABNORMAL HIGH (ref <1.0)

## 2021-03-23 MED ORDER — BISACODYL 10 MG RE SUPP
10.0000 mg | Freq: Every day | RECTAL | Status: DC
  Administered 2021-03-23 – 2021-03-26 (×3): 10 mg via RECTAL
  Filled 2021-03-23 (×5): qty 1

## 2021-03-23 MED ORDER — LUBIPROSTONE 24 MCG PO CAPS
24.0000 ug | ORAL_CAPSULE | Freq: Two times a day (BID) | ORAL | Status: DC
  Administered 2021-03-23 – 2021-04-03 (×21): 24 ug via ORAL
  Filled 2021-03-23 (×25): qty 1

## 2021-03-23 NOTE — Progress Notes (Signed)
Subjective: Patient reports that he is continuing to have severe pain and finding it difficult to find a comfortable position in bed. No acute events overnight.   Objective: Vital signs in last 24 hours: Temp:  [97.9 F (36.6 C)-98.7 F (37.1 C)] 98 F (36.7 C) (09/26 0728) Pulse Rate:  [72-98] 93 (09/26 0728) Resp:  [18-19] 19 (09/26 0728) BP: (114-136)/(72-88) 123/86 (09/26 0728) SpO2:  [95 %-98 %] 96 % (09/26 0728)  Intake/Output from previous day: 09/25 0701 - 09/26 0700 In: 2100 [P.O.:2090; I.V.:10] Out: 375 [Urine:375] Intake/Output this shift: No intake/output data recorded.  Physical Exam: Patient is awake, A/O X 4, and conversant. He is in NAD with VSS. Speech is fluent and appropriate. MAEW with good strength. PERLA, EOMI. CNs grossly intact. Dressing is CDI.  Lab Results: Recent Labs    03/22/21 0419 03/23/21 0555  WBC 8.8 9.5  HGB 10.9* 10.9*  HCT 34.8* 34.5*  PLT 465* 453*   BMET Recent Labs    03/22/21 0419 03/23/21 0515  NA 134* 134*  K 5.0 3.9  CL 94* 95*  CO2 32 30  GLUCOSE 128* 136*  BUN 10 14  CREATININE 0.80 0.67  CALCIUM 9.4 8.9    Studies/Results: MR THORACIC SPINE W WO CONTRAST  Result Date: 03/21/2021 CLINICAL DATA:  Follow-up examination for epidural abscess. EXAM: MRI THORACIC AND LUMBAR SPINE WITHOUT AND WITH CONTRAST TECHNIQUE: Multiplanar and multiecho pulse sequences of the thoracic and lumbar spine were obtained without and with intravenous contrast. CONTRAST:  6mL GADAVIST GADOBUTROL 1 MMOL/ML IV SOLN COMPARISON:  Previous MRI from 03/14/2021. FINDINGS: MRI THORACIC SPINE FINDINGS Alignment: Mildly increased lower thoracic kyphosis, with trace retrolisthesis of T11 on T12 and T12 on L1. Trace anterolisthesis of T9 on T10. Alignment is stable from prior. Vertebrae: Postoperative changes from interval decompressive laminectomy at T9 and T10 is seen. Small collection at the operative site measuring 1.4 x 2.5 cm without mass effect,  consistent with a likely postoperative seroma. Overall, the operative site is satisfactory in appearance without complication. Posterior fusion extending from T10 through the upper lumbar spine again seen. 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 Persistent increased fluid signal intensity within the T9-10 and T10-11 disc spaces. Associated endplate erosion is not significantly changed or progressed from prior. Associated anterior height loss at the T11 vertebral body, and to a lesser extent the anterior aspect of the T9 inferior endplate, stable. Persistent ventral epidural abscess/phlegmon seen extending from T8 through T11. This measures up to 9 mm in maximal AP diameter at the level of T10. Overall size of this collection appears somewhat decreased, with less mass effect and posterior bowing of the thoracic cord at this level. Enhancement involving the posterior thecal sac extending from T7 through the operative site could reflect additional epidural infection and/or postoperative change. The thecal sac has been well decompressed at the laminectomy site, however, there remains persistent fairly severe spinal stenosis just below the operative site at the level of T10-11 (series 16, image 11), slightly worsened in appearance from previous. For example, the thecal sac now measures 5 mm in AP diameter at its most narrow point, previously 6 mm on 03/14/2021. Associated moderate cord flattening at this level with no definite or convincing cord signal changes. Cord: Moderate cord flattening at the level of T10-11 without convincing cord signal changes. Slightly improved mass effect by the ventral epidural collection at the levels of  T8 through T10. Paraspinal and other soft tissues: Normal expected postoperative changes within the lower posterior paraspinous soft tissues. Persistent irregular paraspinous  edema/phlegmon about the lower thoracic spine. Irregular loculated bilateral pleural collections are similar, likely empyema. Disc levels: Postoperative and infectious findings in the lower thoracic spine as detailed above. Minor spondylosis and moderate facet arthrosis elsewhere within the thoracic spine without significant stenosis. MRI LUMBAR SPINE FINDINGS Segmentation:  Standard. Alignment: Sigmoid scoliotic curvature. Chronic grade 1 retrolisthesis of L1 on L2, with grade 1 anterolisthesis of L3 on L4 and L4 on L5, stable. Vertebrae: Extensive postoperative changes extending from T10 through the sacrum. Chronic height loss at L2, stable. No new or interval fracture. No evidence for new or progressive osteomyelitis discitis or other infection within the lumbar spine. Conus medullaris: Extends to the L1-2 level and appears normal. No abnormal enhancement or epidural collections within the lumbar spine. Paraspinal and other soft tissues: Normal expected postoperative changes present within the posterior paraspinous soft tissues. No new loculated collections. Disc levels: Extensive postoperative changes with underlying residual multilevel spondylosis and facet arthrosis, grossly stable and not significantly changed from previous. No significant spinal stenosis within the lumbar spine. Residual multilevel foraminal narrowing is grossly stable. IMPRESSION: 1. Postoperative changes from interval decompressive laminectomy at T9 and T10. Satisfactory appearance of the laminectomy site, with improved stenosis at these levels. However, there is persistent severe spinal stenosis just below the operative site at the level of T10-11 with moderate cord flattening, slightly worsened as compared to 03/14/2021. No definite or convincing cord signal changes at this time. 2. 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 Associated ventral epidural abscess extending from T8 through T11 is overall slightly  decreased in size as compared to previous. 3. Persistent paravertebral inflammation and phlegmon, with persistent irregular loculated bilateral pleural collections/empyema. 4. No other new or progressive findings elsewhere within the thoracolumbar spine. Electronically Signed   By: Rise Mu M.D.   On: 03/21/2021 23:43   MR Lumbar Spine W Wo Contrast  Result Date: 03/21/2021 CLINICAL DATA:  Follow-up examination for epidural abscess. EXAM: MRI THORACIC AND LUMBAR SPINE WITHOUT AND WITH CONTRAST TECHNIQUE: Multiplanar and multiecho pulse sequences of the thoracic and lumbar spine were obtained without and with intravenous contrast. CONTRAST:  13mL GADAVIST GADOBUTROL 1 MMOL/ML IV SOLN COMPARISON:  Previous MRI from 03/14/2021. FINDINGS: MRI THORACIC SPINE FINDINGS Alignment: Mildly increased lower thoracic kyphosis, with trace retrolisthesis of T11 on T12 and T12 on L1. Trace anterolisthesis of T9 on T10. Alignment is stable from prior. Vertebrae: Postoperative changes from interval decompressive laminectomy at T9 and T10 is seen. Small collection at the operative site measuring 1.4 x 2.5 cm without mass effect, consistent with a likely postoperative seroma. Overall, the operative site is satisfactory in appearance without complication. Posterior fusion extending from T10 through the upper lumbar spine again seen. 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 Persistent increased fluid signal intensity within the T9-10 and T10-11 disc spaces. Associated endplate erosion is not significantly changed or progressed from prior. Associated anterior height loss at the T11 vertebral body, and to a lesser extent the anterior aspect of the T9 inferior endplate, stable. Persistent ventral epidural abscess/phlegmon seen extending from T8 through T11. This measures up to 9 mm in maximal AP diameter at the level of  T10. Overall size of this collection appears somewhat decreased, with less mass effect and posterior bowing  of the thoracic cord at this level. Enhancement involving the posterior thecal sac extending from T7 through the operative site could reflect additional epidural infection and/or postoperative change. The thecal sac has been well decompressed at the laminectomy site, however, there remains persistent fairly severe spinal stenosis just below the operative site at the level of T10-11 (series 16, image 11), slightly worsened in appearance from previous. For example, the thecal sac now measures 5 mm in AP diameter at its most narrow point, previously 6 mm on 03/14/2021. Associated moderate cord flattening at this level with no definite or convincing cord signal changes. Cord: Moderate cord flattening at the level of T10-11 without convincing cord signal changes. Slightly improved mass effect by the ventral epidural collection at the levels of T8 through T10. Paraspinal and other soft tissues: Normal expected postoperative changes within the lower posterior paraspinous soft tissues. Persistent irregular paraspinous edema/phlegmon about the lower thoracic spine. Irregular loculated bilateral pleural collections are similar, likely empyema. Disc levels: Postoperative and infectious findings in the lower thoracic spine as detailed above. Minor spondylosis and moderate facet arthrosis elsewhere within the thoracic spine without significant stenosis. MRI LUMBAR SPINE FINDINGS Segmentation:  Standard. Alignment: Sigmoid scoliotic curvature. Chronic grade 1 retrolisthesis of L1 on L2, with grade 1 anterolisthesis of L3 on L4 and L4 on L5, stable. Vertebrae: Extensive postoperative changes extending from T10 through the sacrum. Chronic height loss at L2, stable. No new or interval fracture. No evidence for new or progressive osteomyelitis discitis or other infection within the lumbar spine. Conus medullaris: Extends to the  L1-2 level and appears normal. No abnormal enhancement or epidural collections within the lumbar spine. Paraspinal and other soft tissues: Normal expected postoperative changes present within the posterior paraspinous soft tissues. No new loculated collections. Disc levels: Extensive postoperative changes with underlying residual multilevel spondylosis and facet arthrosis, grossly stable and not significantly changed from previous. No significant spinal stenosis within the lumbar spine. Residual multilevel foraminal narrowing is grossly stable. IMPRESSION: 1. Postoperative changes from interval decompressive laminectomy at T9 and T10. Satisfactory appearance of the laminectomy site, with improved stenosis at these levels. However, there is persistent severe spinal stenosis just below the operative site at the level of T10-11 with moderate cord flattening, slightly worsened as compared to 03/14/2021. No definite or convincing cord signal changes at this time. 2. 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 Associated ventral epidural abscess extending from T8 through T11 is overall slightly decreased in size as compared to previous. 3. Persistent paravertebral inflammation and phlegmon, with persistent irregular loculated bilateral pleural collections/empyema. 4. No other new or progressive findings elsewhere within the thoracolumbar spine. Electronically Signed   By: Rise Mu M.D.   On: 03/21/2021 23:43   CT ABDOMEN PELVIS W CONTRAST  Result Date: 03/21/2021 CLINICAL DATA:  Follow-up for intra-abdominal abscess. EXAM: CT ABDOMEN AND PELVIS WITH CONTRAST TECHNIQUE: Multidetector CT imaging of the abdomen and pelvis was performed using the standard protocol following bolus administration of intravenous contrast. CONTRAST:  OMNIPAQUE IOHEXOL 350 MG/ML SOLN COMPARISON:  03/14/2021 and 02/16/2021. FINDINGS: Lower chest: Small bilateral pleural effusions. Mild dependent lower  lobe in lung base atelectasis. Findings similar to the prior study. Hepatobiliary: No focal liver abnormality is seen. No gallstones, gallbladder wall thickening, or biliary dilatation. Pancreas: Unremarkable. No pancreatic ductal dilatation or surrounding inflammatory changes. Spleen: Normal in size without focal abnormality. Adrenals/Urinary Tract: No adrenal masses. Kidneys normal in size, orientation and position. Subcentimeter low-attenuation lesion, anterior midpole  of the right kidney, likely a cyst, unchanged. No other renal masses or lesions, no stones and no hydronephrosis. Normal ureters. Normal bladder. Stomach/Bowel: Unremarkable stomach. Small bowel and colon are normal in caliber. No wall thickening or inflammation. Vascular/Lymphatic: Minor aortic atherosclerosis. No aneurysm. No enlarged lymph nodes. Reproductive: Normal size prostate. Other: Pigtail catheter has been inserted since the prior study, decompressing the collection anterior to the left iliac vessels. There is mild adjacent stranding, also improved. No new abdominal or pelvic fluid collections. No ascites. Musculoskeletal: Extensive postsurgical changes with fusion from the lower thoracic spine through the upper sacrum. Findings consistent with discitis/osteomyelitis of the lower thoracic spine, similar to the prior CT. IMPRESSION: 1. Left pelvic collection has been decompressed with a percutaneous pigtail catheter. Adjacent inflammatory changes have decreased from the prior CT. 2. No new or acute abnormalities within the abdomen or pelvis. 3. Changes of discitis/osteomyelitis of the lower thoracic spine are similar to the prior CT. Electronically Signed   By: Amie Portland M.D.   On: 03/21/2021 16:31     Assessment/Plan: 70 y.o. male who is POD#8 s/p T9-10 laminectomy for SEA. The patient remains at his baseline neurological status. Back pain and BLE pain continue to be severe. Ambulation and mobility are severely limited due to  pain. 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 IR perc drain for retroperitoneal collection in place. Continue supportive care. Thoracic and lumbar MRI reviewed, no new areas of infection were appreciated. Will plan for surgery in the near future.     PLAN: - Abx per ID - Continue working with PT/OT in the bed - Continue working on pain control - Encourage mobilization as tolerated - TLSO brace when OOB and PRN, he does not need to wear the brace while in bed - Will need additional surgery in his thoracic spine in the near future   LOS: 8 days     Coun[employer]ic, DNP, NP-C 03/23/2021, 8:46 AM

## 2021-03-23 NOTE — Progress Notes (Signed)
Physical Therapy Treatment Patient Details Name: Marco Kennedy MRN: [id number] DOB: 11/22/1950 Today's Date: 03/23/2021   History of Present Illness Pt is a 70 y.o. 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 He is now s/p T9-T10 laminectomy for decompression of thecal sac and debridement of abscess 9/18. Drain placed in left retroperitoneal space on 03/17/19 on bulb suction. Pt had back sx  01/2020 with Dr. Vertell Limber; admission 7/16-22 for sepsis and again 7/26 for SOB and fever. PMHx: back sx, arthritis, degenerative lumbar spinal stenosis, THA 2017.    PT Comments    Patient received in bed, pleasant and premedicated by nursing, but remains very anxious regarding mobility today and perseverates on numbness/tingling in BLEs. Continues to require heavy 2 person assist for all aspects of mobility today. Made multiple attempts at standing with RW and stedy, however unable to clear hips from bed. Did introduce lateral scoots, however really only able to perform a couple of scoots with MaxAx2 with difficulty maintaining precautions. Ultimately needed totalAx2 to return to sidelying then supine and reposition in bed. Left in bed with all needs met, bed alarm active. Will likely require maximove versus sara plus lift for future OOB efforts.     Recommendations for follow up therapy are one component of a multi-disciplinary discharge planning process, led by the attending physician.  Recommendations may be updated based on patient status, additional functional criteria and insurance authorization.  Follow Up Recommendations  CIR;Supervision/Assistance - 24 hour     Equipment Recommendations  Other (comment) (TBD)    Recommendations for Other Services       Precautions / Restrictions Precautions Precautions: Fall;Back Precaution Booklet Issued: Yes (comment) Precaution Comments: bulb drain- abdomen, TLSO in room Required Braces or  Orthoses: Spinal Brace Spinal Brace: Thoracolumbosacral orthotic;Other (comment) Spinal Brace Comments: per Dr Vertell Limber note 9/23, does not need TLSO in supine, only when OOB/EOB Restrictions Weight Bearing Restrictions: No     Mobility  Bed Mobility Overal bed mobility: Needs Assistance Bed Mobility: Rolling;Sidelying to Sit;Sit to Sidelying Rolling: Mod assist;+2 for physical assistance Sidelying to sit: Max assist;+2 for physical assistance     Sit to sidelying: Total assist;+2 for physical assistance General bed mobility comments: continues to require multimodal cues for sequencing/hand placement, very anxious with movement and does have increased pain with all mobility    Transfers Overall transfer level: Needs assistance Equipment used: Rolling walker (2 wheeled) Transfers: Sit to/from Stand;Lateral/Scoot Transfers Sit to Stand: Total assist;+2 physical assistance;From elevated surface        Lateral/Scoot Transfers: Max assist;+2 physical assistance General transfer comment: made multiple attmepts at standing today, first in RW and then in stedy. Unable to clear hips from bed with all attempts. Able to perform a couple of lateral scoots up EOB with maxAx2 but ultimately had to return back to sidelying then supine due to pain  Ambulation/Gait             General Gait Details: unable   Stairs             Wheelchair Mobility    Modified Rankin (Stroke Patients Only)       Balance Overall balance assessment: Needs assistance Sitting-balance support: Bilateral upper extremity supported Sitting balance-Leahy Scale: Poor Sitting balance - Comments: Reliant on BUE supported on bed surface; increased pain with unilateral UE support during grooming tasks. Unable to maintain static sitting balance without UE support. Postural control: Posterior lean  Standing balance-Leahy Scale: Zero                              Cognition Arousal/Alertness:  Awake/alert Behavior During Therapy: WFL for tasks assessed/performed;Anxious Overall Cognitive Status: Within Functional Limits for tasks assessed                                        Exercises      General Comments        Pertinent Vitals/Pain Pain Assessment: Faces Faces Pain Scale: Hurts whole lot Pain Location: lower back, abdomen Pain Descriptors / Indicators: Discomfort;Grimacing;Guarding;Numbness Pain Intervention(s): Limited activity within patient's tolerance;Monitored during session;Premedicated before session;Repositioned;Relaxation    Home Living                      Prior Function            PT Goals (current goals can now be found in the care plan section) Acute Rehab PT Goals Patient Stated Goal: less pain PT Goal Formulation: With patient Time For Goal Achievement: 03/30/21 Potential to Achieve Goals: Fair Progress towards PT goals: Progressing toward goals (very slowly)    Frequency    Min 5X/week      PT Plan Current plan remains appropriate    Co-evaluation              AM-PAC PT "6 Clicks" Mobility   Outcome Measure  Help needed turning from your back to your side while in a flat bed without using bedrails?: A Lot Help needed moving from lying on your back to sitting on the side of a flat bed without using bedrails?: Total Help needed moving to and from a bed to a chair (including a wheelchair)?: Total Help needed standing up from a chair using your arms (e.g., wheelchair or bedside chair)?: Total Help needed to walk in hospital room?: Total Help needed climbing 3-5 steps with a railing? : Total 6 Click Score: 7    End of Session Equipment Utilized During Treatment: Gait belt;Other (comment) (TLSO) Activity Tolerance: Patient limited by pain Patient left: in bed;with call bell/phone within reach;with bed alarm set;with SCD's reapplied Nurse Communication: Mobility status PT Visit Diagnosis: Other  abnormalities of gait and mobility (R26.89);Pain Pain - part of body:  (back/abdomen)     Time: 8675-4492 PT Time Calculation (min) (ACUTE ONLY): 26 min  Charges:  $Therapeutic Activity: 23-37 mins                    Windell Norfolk, DPT, PN2   Supplemental Physical Therapist [place]    Pager [phone] Acute Rehab Office [phone]

## 2021-03-23 NOTE — Progress Notes (Signed)
Referring Physician(s): * No referring provider recorded for this case *  Supervising Physician: Simonne Come  Patient Status:  [facility] - In-pt  History of Present Illness: Left retroperitoneal drain placement for recurrent epidural abscess on 03/16/2021 after it was noted on 03/14/2021 that the previous pelvic fluid site collection had increased in size.  Now with resolution of epidural abscess.  Patient reports feeling well physically but disappointed about physical strength because he was not able to stand with PT today despite assistive device.  He is encouraged that the drain is ready to be removed.  Allergies: Patient has no known allergies.  Medications: Prior to Admission medications   Medication Sig Start Date End Date Taking? Authorizing Provider  acetaminophen (TYLENOL) 325 MG tablet Take 2 tablets (650 mg total) by mouth every 6 (six) hours as needed for mild pain (or Fever >/= 101). 02/03/21  Yes Rizwan, Saima, MD  amLODipine (NORVASC) 5 MG tablet Take 1 tablet (5 mg total) by mouth daily. 02/16/21  Yes Angiulli, Mcarthur Rossetti, PA-C  ceFAZolin (ANCEF) 2-4 GM/100ML-% IVPB Inject 100 mLs (2 g total) into the vein every 8 (eight) hours. 02/16/21  Yes Angiulli, Mcarthur Rossetti, PA-C  cholecalciferol (VITAMIN D3) 25 MCG (1000 UNIT) tablet Take 1 tablet (1,000 Units total) by mouth daily. 02/16/21  Yes Angiulli, Mcarthur Rossetti, PA-C  diclofenac (VOLTAREN) 75 MG EC tablet Take 75 mg by mouth 2 (two) times daily. 02/13/21  Yes [provider]  docusate sodium (COLACE) 100 MG capsule Take 100 mg by mouth daily.   Yes [provider]  Multiple Vitamin (MULTIVITAMIN WITH MINERALS) TABS tablet Take 1 tablet by mouth daily.   Yes [provider]  oxyCODONE (OXY IR/ROXICODONE) 5 MG immediate release tablet Take 5 mg by mouth every 6 (six) hours as needed for severe pain.   Yes [provider]  polyethylene glycol (MIRALAX / GLYCOLAX) 17 g packet Take 17 g by mouth 2 (two) times  daily. Patient taking differently: Take 17 g by mouth daily as needed for mild constipation. 01/15/21  Yes Azucena Fallen, MD  colchicine 0.6 MG tablet Take 1 tablet (0.6 mg total) by mouth daily. Patient not taking: No sig reported 02/16/21   Charlton Amor, PA-C     Vital Signs: BP 117/80 (BP Location: Left Arm)   Pulse (!) 108   Temp 97.8 F (36.6 C) (Axillary)   Resp 18   Ht 5\' 7"  (1.702 m)   Wt 234 lb (106.1 kg)   SpO2 94%   BMI 36.65 kg/m   Physical Exam Constitutional:      Appearance: He is obese. He is not ill-appearing.  HENT:     Head: Normocephalic and atraumatic.  Cardiovascular:     Rate and Rhythm: Tachycardia present.     Pulses: Normal pulses.  Pulmonary:     Effort: Pulmonary effort is normal.  Abdominal:     Palpations: Abdomen is soft.  Skin:    General: Skin is warm and dry.  Neurological:     General: No focal deficit present.     Mental Status: He is alert and oriented to person, place, and time.  Psychiatric:        Behavior: Behavior normal.        Thought Content: Thought content normal.    Imaging: MR THORACIC SPINE W WO CONTRAST  Result Date: 03/21/2021 CLINICAL DATA:  Follow-up examination for epidural abscess. EXAM: MRI THORACIC AND LUMBAR SPINE WITHOUT AND WITH CONTRAST  TECHNIQUE: Multiplanar and multiecho pulse sequences of the thoracic and lumbar spine were obtained without and with intravenous contrast. CONTRAST:  10mL GADAVIST GADOBUTROL 1 MMOL/ML IV SOLN COMPARISON:  Previous MRI from 03/14/2021. FINDINGS: MRI THORACIC SPINE FINDINGS Alignment: Mildly increased lower thoracic kyphosis, with trace retrolisthesis of T11 on T12 and T12 on L1. Trace anterolisthesis of T9 on T10. Alignment is stable from prior. Vertebrae: Postoperative changes from interval decompressive laminectomy at T9 and T10 is seen. Small collection at the operative site measuring 1.4 x 2.5 cm without mass effect, consistent with a likely postoperative seroma.  Overall, the operative site is satisfactory in appearance without complication. Posterior fusion extending from T10 through the upper lumbar spine again seen. 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 Persistent increased fluid signal intensity within the T9-10 and T10-11 disc spaces. Associated endplate erosion is not significantly changed or progressed from prior. Associated anterior height loss at the T11 vertebral body, and to a lesser extent the anterior aspect of the T9 inferior endplate, stable. Persistent ventral epidural abscess/phlegmon seen extending from T8 through T11. This measures up to 9 mm in maximal AP diameter at the level of T10. Overall size of this collection appears somewhat decreased, with less mass effect and posterior bowing of the thoracic cord at this level. Enhancement involving the posterior thecal sac extending from T7 through the operative site could reflect additional epidural infection and/or postoperative change. The thecal sac has been well decompressed at the laminectomy site, however, there remains persistent fairly severe spinal stenosis just below the operative site at the level of T10-11 (series 16, image 11), slightly worsened in appearance from previous. For example, the thecal sac now measures 5 mm in AP diameter at its most narrow point, previously 6 mm on 03/14/2021. Associated moderate cord flattening at this level with no definite or convincing cord signal changes. Cord: Moderate cord flattening at the level of T10-11 without convincing cord signal changes. Slightly improved mass effect by the ventral epidural collection at the levels of T8 through T10. Paraspinal and other soft tissues: Normal expected postoperative changes within the lower posterior paraspinous soft tissues. Persistent irregular paraspinous edema/phlegmon about the lower thoracic spine. Irregular  loculated bilateral pleural collections are similar, likely empyema. Disc levels: Postoperative and infectious findings in the lower thoracic spine as detailed above. Minor spondylosis and moderate facet arthrosis elsewhere within the thoracic spine without significant stenosis. MRI LUMBAR SPINE FINDINGS Segmentation:  Standard. Alignment: Sigmoid scoliotic curvature. Chronic grade 1 retrolisthesis of L1 on L2, with grade 1 anterolisthesis of L3 on L4 and L4 on L5, stable. Vertebrae: Extensive postoperative changes extending from T10 through the sacrum. Chronic height loss at L2, stable. No new or interval fracture. No evidence for new or progressive osteomyelitis discitis or other infection within the lumbar spine. Conus medullaris: Extends to the L1-2 level and appears normal. No abnormal enhancement or epidural collections within the lumbar spine. Paraspinal and other soft tissues: Normal expected postoperative changes present within the posterior paraspinous soft tissues. No new loculated collections. Disc levels: Extensive postoperative changes with underlying residual multilevel spondylosis and facet arthrosis, grossly stable and not significantly changed from previous. No significant spinal stenosis within the lumbar spine. Residual multilevel foraminal narrowing is grossly stable. IMPRESSION: 1. Postoperative changes from interval decompressive laminectomy at T9 and T10. Satisfactory appearance of the laminectomy site, with improved stenosis at these levels. However, there is persistent severe  spinal stenosis just below the operative site at the level of T10-11 with moderate cord flattening, slightly worsened as compared to 03/14/2021. No definite or convincing cord signal changes at this time. 2. 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 Associated ventral epidural abscess extending from T8 through T11 is overall slightly decreased in size as compared to previous. 3. Persistent  paravertebral inflammation and phlegmon, with persistent irregular loculated bilateral pleural collections/empyema. 4. No other new or progressive findings elsewhere within the thoracolumbar spine. Electronically Signed   By: Rise Mu M.D.   On: 03/21/2021 23:43   MR Lumbar Spine W Wo Contrast  Result Date: 03/21/2021 CLINICAL DATA:  Follow-up examination for epidural abscess. EXAM: MRI THORACIC AND LUMBAR SPINE WITHOUT AND WITH CONTRAST TECHNIQUE: Multiplanar and multiecho pulse sequences of the thoracic and lumbar spine were obtained without and with intravenous contrast. CONTRAST:  59mL GADAVIST GADOBUTROL 1 MMOL/ML IV SOLN COMPARISON:  Previous MRI from 03/14/2021. FINDINGS: MRI THORACIC SPINE FINDINGS Alignment: Mildly increased lower thoracic kyphosis, with trace retrolisthesis of T11 on T12 and T12 on L1. Trace anterolisthesis of T9 on T10. Alignment is stable from prior. Vertebrae: Postoperative changes from interval decompressive laminectomy at T9 and T10 is seen. Small collection at the operative site measuring 1.4 x 2.5 cm without mass effect, consistent with a likely postoperative seroma. Overall, the operative site is satisfactory in appearance without complication. Posterior fusion extending from T10 through the upper lumbar spine again seen. 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 Persistent increased fluid signal intensity within the T9-10 and T10-11 disc spaces. Associated endplate erosion is not significantly changed or progressed from prior. Associated anterior height loss at the T11 vertebral body, and to a lesser extent the anterior aspect of the T9 inferior endplate, stable. Persistent ventral epidural abscess/phlegmon seen extending from T8 through T11. This measures up to 9 mm in maximal AP diameter at the level of T10. Overall size of this collection appears somewhat  decreased, with less mass effect and posterior bowing of the thoracic cord at this level. Enhancement involving the posterior thecal sac extending from T7 through the operative site could reflect additional epidural infection and/or postoperative change. The thecal sac has been well decompressed at the laminectomy site, however, there remains persistent fairly severe spinal stenosis just below the operative site at the level of T10-11 (series 16, image 11), slightly worsened in appearance from previous. For example, the thecal sac now measures 5 mm in AP diameter at its most narrow point, previously 6 mm on 03/14/2021. Associated moderate cord flattening at this level with no definite or convincing cord signal changes. Cord: Moderate cord flattening at the level of T10-11 without convincing cord signal changes. Slightly improved mass effect by the ventral epidural collection at the levels of T8 through T10. Paraspinal and other soft tissues: Normal expected postoperative changes within the lower posterior paraspinous soft tissues. Persistent irregular paraspinous edema/phlegmon about the lower thoracic spine. Irregular loculated bilateral pleural collections are similar, likely empyema. Disc levels: Postoperative and infectious findings in the lower thoracic spine as detailed above. Minor spondylosis and moderate facet arthrosis elsewhere within the thoracic spine without significant stenosis. MRI LUMBAR SPINE FINDINGS Segmentation:  Standard. Alignment: Sigmoid scoliotic curvature. Chronic grade 1 retrolisthesis of L1 on L2, with grade 1 anterolisthesis of L3 on L4 and L4 on L5, stable. Vertebrae: Extensive postoperative changes extending from T10 through the sacrum. Chronic  height loss at L2, stable. No new or interval fracture. No evidence for new or progressive osteomyelitis discitis or other infection within the lumbar spine. Conus medullaris: Extends to the L1-2 level and appears normal. No abnormal  enhancement or epidural collections within the lumbar spine. Paraspinal and other soft tissues: Normal expected postoperative changes present within the posterior paraspinous soft tissues. No new loculated collections. Disc levels: Extensive postoperative changes with underlying residual multilevel spondylosis and facet arthrosis, grossly stable and not significantly changed from previous. No significant spinal stenosis within the lumbar spine. Residual multilevel foraminal narrowing is grossly stable. IMPRESSION: 1. Postoperative changes from interval decompressive laminectomy at T9 and T10. Satisfactory appearance of the laminectomy site, with improved stenosis at these levels. However, there is persistent severe spinal stenosis just below the operative site at the level of T10-11 with moderate cord flattening, slightly worsened as compared to 03/14/2021. No definite or convincing cord signal changes at this time. 2. 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 Associated ventral epidural abscess extending from T8 through T11 is overall slightly decreased in size as compared to previous. 3. Persistent paravertebral inflammation and phlegmon, with persistent irregular loculated bilateral pleural collections/empyema. 4. No other new or progressive findings elsewhere within the thoracolumbar spine. Electronically Signed   By: Rise Mu M.D.   On: 03/21/2021 23:43   CT ABDOMEN PELVIS W CONTRAST  Result Date: 03/21/2021 CLINICAL DATA:  Follow-up for intra-abdominal abscess. EXAM: CT ABDOMEN AND PELVIS WITH CONTRAST TECHNIQUE: Multidetector CT imaging of the abdomen and pelvis was performed using the standard protocol following bolus administration of intravenous contrast. CONTRAST:  OMNIPAQUE IOHEXOL 350 MG/ML SOLN COMPARISON:  03/14/2021 and 02/16/2021. FINDINGS: Lower chest: Small bilateral pleural effusions. Mild dependent lower lobe in lung base atelectasis. Findings  similar to the prior study. Hepatobiliary: No focal liver abnormality is seen. No gallstones, gallbladder wall thickening, or biliary dilatation. Pancreas: Unremarkable. No pancreatic ductal dilatation or surrounding inflammatory changes. Spleen: Normal in size without focal abnormality. Adrenals/Urinary Tract: No adrenal masses. Kidneys normal in size, orientation and position. Subcentimeter low-attenuation lesion, anterior midpole of the right kidney, likely a cyst, unchanged. No other renal masses or lesions, no stones and no hydronephrosis. Normal ureters. Normal bladder. Stomach/Bowel: Unremarkable stomach. Small bowel and colon are normal in caliber. No wall thickening or inflammation. Vascular/Lymphatic: Minor aortic atherosclerosis. No aneurysm. No enlarged lymph nodes. Reproductive: Normal size prostate. Other: Pigtail catheter has been inserted since the prior study, decompressing the collection anterior to the left iliac vessels. There is mild adjacent stranding, also improved. No new abdominal or pelvic fluid collections. No ascites. Musculoskeletal: Extensive postsurgical changes with fusion from the lower thoracic spine through the upper sacrum. Findings consistent with discitis/osteomyelitis of the lower thoracic spine, similar to the prior CT. IMPRESSION: 1. Left pelvic collection has been decompressed with a percutaneous pigtail catheter. Adjacent inflammatory changes have decreased from the prior CT. 2. No new or acute abnormalities within the abdomen or pelvis. 3. Changes of discitis/osteomyelitis of the lower thoracic spine are similar to the prior CT. Electronically Signed   By: Amie Portland M.D.   On: 03/21/2021 16:31    Labs:  CBC: Recent Labs    03/20/21 0620 03/21/21 0650 03/22/21 0419 03/23/21 0555  WBC 8.0 10.0 8.8 9.5  HGB 10.4* 10.2* 10.9* 10.9*  HCT 32.5* 32.9* 34.8* 34.5*  PLT 411* 430* 465* 453*    COAGS: Recent Labs    01/31/21 0500 03/16/21 1124  INR 1.3* 1.2  BMP: Recent Labs    03/20/21 0620 03/21/21 0650 03/22/21 0419 03/23/21 0515  NA 133* 134* 134* 134*  K 3.9 4.3 5.0 3.9  CL 94* 95* 94* 95*  CO2 30 30 32 30  GLUCOSE 127* 139* 128* 136*  BUN 15 13 10 14   CALCIUM 8.7* 8.6* 9.4 8.9  CREATININE 0.61 0.60* 0.80 0.67  GFRNONAA >60 >60 >60 >60    LIVER FUNCTION TESTS: Recent Labs    02/04/21 0425 02/06/21 0506 02/13/21 0439 03/14/21 1347  BILITOT 0.4 0.3 0.2* 0.5  AST 49* 47* 25 25  ALT 27 22 12 7   ALKPHOS 90 91 83 79  PROT 5.4* 5.5* 5.6* 7.6  ALBUMIN 1.7* 1.8* 2.0* 3.3*    Assessment and Plan:  Epidural Abscess -Now resolved per CT and drain with minimal output -Drain removal  Successful removal of abscess drain catheter without complication.    Electronically Signed: 03/16/21, PA 03/23/2021, 2:08 PM   I spent a total of 15 Minutes at the the patient's bedside AND on the patient's hospital floor or unit, greater than 50% of which was counseling/coordinating care for drain removal.

## 2021-03-23 NOTE — Progress Notes (Signed)
Inpatient Rehab Admissions Coordinator:   Per therapy recommendations patient was rescreened for CIR candidacy by Megan Salon, MS, CCC-S ). At this time, Pt. Is pain limited and has orders for bedrest. Neurosurgery also planning possible additional surgery. He is not an appropriate candidate for CIR at this time, but [facility] team will follow and rescreen once all surgeries are completed and bedrest order is lifted.  Please contact me with any questions.    Megan Salon, MS, CCC-SLP Rehab Admissions Coordinator  [phone] (celll) [phone] (office)

## 2021-03-23 NOTE — Progress Notes (Signed)
HD#8 SUBJECTIVE:  Patient Summary: Marco Kennedy is a 70 y.o. with a pertinent PMH of chronic low back pain and hypertension, who presented for MRI follow-up and had worsening lower back pain and admitted for epidural abscess.   Overnight Events: None  Interim History: Patient continues to have pain, however he is on a good pain regimen at this time.  He continues to endorse lack of bowel movements despite multiple agents including enema over the weekend.  OBJECTIVE:  Vital Signs: Vitals:   03/22/21 1900 03/23/21 0000 03/23/21 0400 03/23/21 0728  BP: 122/85 114/74 136/88 123/86  Pulse: 93 72 90 93  Resp: 18 18 18 19   Temp: 98.7 F (37.1 C) 98.4 F (36.9 C) 98.4 F (36.9 C) 98 F (36.7 C)  TempSrc: Oral Oral Oral   SpO2: 95% 98% 96% 96%  Weight:      Height:       Supplemental O2: Room Air SpO2: 96 % O2 Flow Rate (L/min): 2 L/min  Filed Weights   03/15/21 0014  Weight: 106.1 kg     Intake/Output Summary (Last 24 hours) at 03/23/2021 1105 Last data filed at 03/23/2021 0615 Gross per 24 hour  Intake 2100 ml  Output --  Net 2100 ml   Net IO Since Admission: -2,362.85 mL [03/23/21 1105]  Physical Exam: Constitutional: Elderly gentleman resting in bed.  No acute distress noted. Cardio: Regular rate and rhythm.  No murmurs, rubs, gallops. Pulm: Clear to auscultation bilaterally.  Normal work of breathing on room air. Abdomen: Soft, nondistended, mild tenderness to palpation near drain site. MSK: No pitting edema bilateral lower extremities. Skin: Skin is warm and dry. Neuro: Alert and oriented x3.  No focal deficit noted. Psych: Normal mood and affect.  Patient Lines/Drains/Airways Status     Active Line/Drains/Airways     Name Placement date Placement time Site Days   Peripheral IV 03/14/21 20 G 1.88" Anterior;Left;Proximal Forearm 03/14/21  1549  Forearm  9   PICC Single Lumen 01/15/21 Right Basilic 39 cm 0 cm 01/15/21  01/17/21  Basilic  67   Closed System  Drain 1 Left Flank Bulb (JP) 12 Fr. 01/27/21  1407  Flank  55   Closed System Drain Lateral LLQ Bulb (JP) 10 Fr. 03/16/21  1335  LLQ  7   Incision (Closed) 04/18/20 Flank Left 04/18/20  1419  -- 339   Incision (Closed) 01/13/21 Back 01/13/21  1016  -- 69   Incision (Closed) 03/14/21 Back 03/14/21  2006  -- 9   Wound / Incision (Open or Dehisced) 01/29/21 Non-pressure wound;Other (Comment) Buttocks Left 01/29/21  0900  Buttocks  53             ASSESSMENT/PLAN:  Assessment: Active Problems:   Vertebral osteomyelitis (HCC)   Empyema (HCC)   Sepsis due to Escherichia coli (E. coli) (HCC)   Retroperitoneal fluid collection   Constipation   Epidural abscess   Discitis of thoracic region   Plan: #Epidural abscess Fluid culture and blood cultures showed no growth.  He continues to endorse significant pain.  MRI on 9/24 showed findings concerning for persistent epidural abscess with associated osteomyelitis discitis at T9-T10 and T10-T11. -Neurosurgery on board, appreciate their recommendations. -Patient is on bed rest with exception of mobilization to bedside commode.  Continue to work with PT in bed. -TSLO brace when out of bed and as needed -ID on board, appreciate their recommendations             -  Continue cefazolin 2 g IV every 8 hours -Trend inflammatory markers every 48 hours   #Constipation Patient continues to endorse lack of bowel movements.  He was given 1 dose of milk of magnesia 15 mL, and has Senokot 2 tablets daily as well as MiraLAX as part of his regimen at this time.  He also received enema over the weekend.  Unfortunately pain management with opioids is not helping with his constipation. -Bisacodyl suppository ordered   #Hypertension Chronically managed.  BP of 136/88 today. -Continue amlodipine 5 mg daily   #Retroperitoneal fluid collection Noted on imaging done at admission.  S/P IR aspiration and drain placement.   -Monitor  Best Practice: Diet: Regular  diet IVF: Fluids: None VTE: enoxaparin (LOVENOX) injection 40 mg Start: 03/20/21 0951 SCDs Start: 03/14/21 1708 Code: Full AB: Cefazolin 2 g IV every 8 hours DISPO: Continue to monitor inpatient.  Signature: Champ Mungo, D.O.  Internal Medicine Resident, PGY-1 [facility]  Pager: [phone] 11:05 AM, 03/23/2021   Please contact the on call pager after 5 pm and on weekends at [phone].

## 2021-03-23 NOTE — Progress Notes (Signed)
Patient experiencing pain and discomfort. Requested RT check back. Refused CPAP at this time.

## 2021-03-23 NOTE — Progress Notes (Signed)
[facility]    Date of Admission:  03/14/2021   Total days of antibiotics - 9 wk  ID: Marco Kennedy is a 70 y.o. male who is POD#8 s/p T9-10 laminectomy for SEA with bilateral leg numbness,Back pain,Ambulation and mobility are severely limited due to pain. 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 Originally e.coli infection Active Problems:   Vertebral osteomyelitis (HCC)   Empyema (HCC)   Sepsis due to Escherichia coli (E. coli) (HCC)   Retroperitoneal fluid collection   Constipation   Epidural abscess   Discitis of thoracic region    Subjective: Afebrile. Numbness of lower extemities bilaterally.constipation x 9d  Imaging--IMPRESSION: 1. Postoperative changes from interval decompressive laminectomy at T9 and T10. Satisfactory appearance of the laminectomy site, with improved stenosis at these levels. However, there is persistent severe spinal stenosis just below the operative site at the level of T10-11 with moderate cord flattening, slightly worsened as compared to 03/14/2021. No definite or convincing cord signal changes at this time. 2. 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 Associated ventral epidural abscess extending from T8 through T11 is overall slightly decreased in size as compared to previous. 3. Persistent paravertebral inflammation and phlegmon, with persistent irregular loculated bilateral pleural collections/empyema. 4. No other new or progressive findings elsewhere within the thoracolumbar spine. Medications:   acetaminophen  650 mg Oral Q6H   amLODipine  5 mg Oral Daily   bisacodyl  10 mg Rectal Daily   Chlorhexidine Gluconate Cloth  6 each Topical Daily   enoxaparin (LOVENOX) injection  40 mg Subcutaneous Q24H   lubiprostone  24 mcg Oral BID WC   polyethylene glycol  17 g Oral Daily   senna-docusate  2 tablet Oral Daily   sodium chloride flush  5 mL Intracatheter Q8H     Objective: Vital signs in last 24 hours: Temp:  [97.8 F (36.6 C)-98.7 F (37.1 C)] 97.8 F (36.6 C) (09/26 1236) Pulse Rate:  [72-108] 108 (09/26 1236) Resp:  [18-19] 18 (09/26 1236) BP: (114-136)/(72-88) 117/80 (09/26 1236) SpO2:  [94 %-98 %] 94 % (09/26 1236) Physical Exam  Constitutional: He is oriented to person, place, and time. He appears well-developed and well-nourished. No distress.  HENT:  Mouth/Throat: Oropharynx is clear and moist. No oropharyngeal exudate.  Cardiovascular: Normal rate, regular rhythm and normal heart sounds. Exam reveals no gallop and no friction rub.  No murmur heard.  Pulmonary/Chest: Effort normal and breath sounds normal. No respiratory distress. He has no wheezes.  Abdominal: Soft. Bowel sounds are normal. He exhibits no distension. There is no tenderness.  Lymphadenopathy:  He has no cervical adenopathy.  Neurological: He is alert and oriented to person, place, and time. 4/5 strength bilateral Le, but decrease sensation Skin: Skin is warm and dry. No rash noted. No erythema.  Psychiatric: He has a normal mood and affect. His behavior is normal.    Lab Results Recent Labs    03/22/21 0419 03/23/21 0515 03/23/21 0555  WBC 8.8  --  9.5  HGB 10.9*  --  10.9*  HCT 34.8*  --  34.5*  NA 134* 134*  --   K 5.0 3.9  --   CL 94* 95*  --   CO2 32 30  --   BUN 10 14  --   CREATININE 0.80 0.67  --     C-Reactive Protein Recent Labs    03/21/21 1505 03/23/21 1100  CRP 6.8*  6.2*    Microbiology: 7/17  abscess cx + ecoli. Studies/Results: MR THORACIC SPINE W WO CONTRAST  Result Date: 03/21/2021 CLINICAL DATA:  Follow-up examination for epidural abscess. EXAM: MRI THORACIC AND LUMBAR SPINE WITHOUT AND WITH CONTRAST TECHNIQUE: Multiplanar and multiecho pulse sequences of the thoracic and lumbar spine were obtained without and with intravenous contrast. CONTRAST:  3mL GADAVIST GADOBUTROL 1 MMOL/ML IV SOLN COMPARISON:  Previous MRI from  03/14/2021. FINDINGS: MRI THORACIC SPINE FINDINGS Alignment: Mildly increased lower thoracic kyphosis, with trace retrolisthesis of T11 on T12 and T12 on L1. Trace anterolisthesis of T9 on T10. Alignment is stable from prior. Vertebrae: Postoperative changes from interval decompressive laminectomy at T9 and T10 is seen. Small collection at the operative site measuring 1.4 x 2.5 cm without mass effect, consistent with a likely postoperative seroma. Overall, the operative site is satisfactory in appearance without complication. Posterior fusion extending from T10 through the upper lumbar spine again seen. 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 Persistent increased fluid signal intensity within the T9-10 and T10-11 disc spaces. Associated endplate erosion is not significantly changed or progressed from prior. Associated anterior height loss at the T11 vertebral body, and to a lesser extent the anterior aspect of the T9 inferior endplate, stable. Persistent ventral epidural abscess/phlegmon seen extending from T8 through T11. This measures up to 9 mm in maximal AP diameter at the level of T10. Overall size of this collection appears somewhat decreased, with less mass effect and posterior bowing of the thoracic cord at this level. Enhancement involving the posterior thecal sac extending from T7 through the operative site could reflect additional epidural infection and/or postoperative change. The thecal sac has been well decompressed at the laminectomy site, however, there remains persistent fairly severe spinal stenosis just below the operative site at the level of T10-11 (series 16, image 11), slightly worsened in appearance from previous. For example, the thecal sac now measures 5 mm in AP diameter at its most narrow point, previously 6 mm on 03/14/2021. Associated moderate cord flattening at this level with no  definite or convincing cord signal changes. Cord: Moderate cord flattening at the level of T10-11 without convincing cord signal changes. Slightly improved mass effect by the ventral epidural collection at the levels of T8 through T10. Paraspinal and other soft tissues: Normal expected postoperative changes within the lower posterior paraspinous soft tissues. Persistent irregular paraspinous edema/phlegmon about the lower thoracic spine. Irregular loculated bilateral pleural collections are similar, likely empyema. Disc levels: Postoperative and infectious findings in the lower thoracic spine as detailed above. Minor spondylosis and moderate facet arthrosis elsewhere within the thoracic spine without significant stenosis. MRI LUMBAR SPINE FINDINGS Segmentation:  Standard. Alignment: Sigmoid scoliotic curvature. Chronic grade 1 retrolisthesis of L1 on L2, with grade 1 anterolisthesis of L3 on L4 and L4 on L5, stable. Vertebrae: Extensive postoperative changes extending from T10 through the sacrum. Chronic height loss at L2, stable. No new or interval fracture. No evidence for new or progressive osteomyelitis discitis or other infection within the lumbar spine. Conus medullaris: Extends to the L1-2 level and appears normal. No abnormal enhancement or epidural collections within the lumbar spine. Paraspinal and other soft tissues: Normal expected postoperative changes present within the posterior paraspinous soft tissues. No new loculated collections. Disc levels: Extensive postoperative changes with underlying residual multilevel spondylosis and facet arthrosis, grossly stable and not significantly changed from previous. No significant spinal stenosis  within the lumbar spine. Residual multilevel foraminal narrowing is grossly stable. IMPRESSION: 1. Postoperative changes from interval decompressive laminectomy at T9 and T10. Satisfactory appearance of the laminectomy site, with improved stenosis at these levels.  However, there is persistent severe spinal stenosis just below the operative site at the level of T10-11 with moderate cord flattening, slightly worsened as compared to 03/14/2021. No definite or convincing cord signal changes at this time. 2. 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 Associated ventral epidural abscess extending from T8 through T11 is overall slightly decreased in size as compared to previous. 3. Persistent paravertebral inflammation and phlegmon, with persistent irregular loculated bilateral pleural collections/empyema. 4. No other new or progressive findings elsewhere within the thoracolumbar spine. Electronically Signed   By: Rise Mu M.D.   On: 03/21/2021 23:43   MR Lumbar Spine W Wo Contrast  Result Date: 03/21/2021 CLINICAL DATA:  Follow-up examination for epidural abscess. EXAM: MRI THORACIC AND LUMBAR SPINE WITHOUT AND WITH CONTRAST TECHNIQUE: Multiplanar and multiecho pulse sequences of the thoracic and lumbar spine were obtained without and with intravenous contrast. CONTRAST:  45mL GADAVIST GADOBUTROL 1 MMOL/ML IV SOLN COMPARISON:  Previous MRI from 03/14/2021. FINDINGS: MRI THORACIC SPINE FINDINGS Alignment: Mildly increased lower thoracic kyphosis, with trace retrolisthesis of T11 on T12 and T12 on L1. Trace anterolisthesis of T9 on T10. Alignment is stable from prior. Vertebrae: Postoperative changes from interval decompressive laminectomy at T9 and T10 is seen. Small collection at the operative site measuring 1.4 x 2.5 cm without mass effect, consistent with a likely postoperative seroma. Overall, the operative site is satisfactory in appearance without complication. Posterior fusion extending from T10 through the upper lumbar spine again seen. 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 Persistent increased fluid signal intensity within the  T9-10 and T10-11 disc spaces. Associated endplate erosion is not significantly changed or progressed from prior. Associated anterior height loss at the T11 vertebral body, and to a lesser extent the anterior aspect of the T9 inferior endplate, stable. Persistent ventral epidural abscess/phlegmon seen extending from T8 through T11. This measures up to 9 mm in maximal AP diameter at the level of T10. Overall size of this collection appears somewhat decreased, with less mass effect and posterior bowing of the thoracic cord at this level. Enhancement involving the posterior thecal sac extending from T7 through the operative site could reflect additional epidural infection and/or postoperative change. The thecal sac has been well decompressed at the laminectomy site, however, there remains persistent fairly severe spinal stenosis just below the operative site at the level of T10-11 (series 16, image 11), slightly worsened in appearance from previous. For example, the thecal sac now measures 5 mm in AP diameter at its most narrow point, previously 6 mm on 03/14/2021. Associated moderate cord flattening at this level with no definite or convincing cord signal changes. Cord: Moderate cord flattening at the level of T10-11 without convincing cord signal changes. Slightly improved mass effect by the ventral epidural collection at the levels of T8 through T10. Paraspinal and other soft tissues: Normal expected postoperative changes within the lower posterior paraspinous soft tissues. Persistent irregular paraspinous edema/phlegmon about the lower thoracic spine. Irregular loculated bilateral pleural collections are similar, likely empyema. Disc levels: Postoperative and infectious findings in the lower thoracic spine as detailed above. Minor spondylosis and moderate facet arthrosis elsewhere within the thoracic spine without significant stenosis. MRI LUMBAR SPINE  FINDINGS Segmentation:  Standard. Alignment: Sigmoid scoliotic  curvature. Chronic grade 1 retrolisthesis of L1 on L2, with grade 1 anterolisthesis of L3 on L4 and L4 on L5, stable. Vertebrae: Extensive postoperative changes extending from T10 through the sacrum. Chronic height loss at L2, stable. No new or interval fracture. No evidence for new or progressive osteomyelitis discitis or other infection within the lumbar spine. Conus medullaris: Extends to the L1-2 level and appears normal. No abnormal enhancement or epidural collections within the lumbar spine. Paraspinal and other soft tissues: Normal expected postoperative changes present within the posterior paraspinous soft tissues. No new loculated collections. Disc levels: Extensive postoperative changes with underlying residual multilevel spondylosis and facet arthrosis, grossly stable and not significantly changed from previous. No significant spinal stenosis within the lumbar spine. Residual multilevel foraminal narrowing is grossly stable. IMPRESSION: 1. Postoperative changes from interval decompressive laminectomy at T9 and T10. Satisfactory appearance of the laminectomy site, with improved stenosis at these levels. However, there is persistent severe spinal stenosis just below the operative site at the level of T10-11 with moderate cord flattening, slightly worsened as compared to 03/14/2021. No definite or convincing cord signal changes at this time. 2. 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 Associated ventral epidural abscess extending from T8 through T11 is overall slightly decreased in size as compared to previous. 3. Persistent paravertebral inflammation and phlegmon, with persistent irregular loculated bilateral pleural collections/empyema. 4. No other new or progressive findings elsewhere within the thoracolumbar spine. Electronically Signed   By: Rise Mu M.D.   On: 03/21/2021 23:43   CT ABDOMEN PELVIS W CONTRAST  Result Date: 03/21/2021 CLINICAL DATA:  Follow-up for  intra-abdominal abscess. EXAM: CT ABDOMEN AND PELVIS WITH CONTRAST TECHNIQUE: Multidetector CT imaging of the abdomen and pelvis was performed using the standard protocol following bolus administration of intravenous contrast. CONTRAST:  OMNIPAQUE IOHEXOL 350 MG/ML SOLN COMPARISON:  03/14/2021 and 02/16/2021. FINDINGS: Lower chest: Small bilateral pleural effusions. Mild dependent lower lobe in lung base atelectasis. Findings similar to the prior study. Hepatobiliary: No focal liver abnormality is seen. No gallstones, gallbladder wall thickening, or biliary dilatation. Pancreas: Unremarkable. No pancreatic ductal dilatation or surrounding inflammatory changes. Spleen: Normal in size without focal abnormality. Adrenals/Urinary Tract: No adrenal masses. Kidneys normal in size, orientation and position. Subcentimeter low-attenuation lesion, anterior midpole of the right kidney, likely a cyst, unchanged. No other renal masses or lesions, no stones and no hydronephrosis. Normal ureters. Normal bladder. Stomach/Bowel: Unremarkable stomach. Small bowel and colon are normal in caliber. No wall thickening or inflammation. Vascular/Lymphatic: Minor aortic atherosclerosis. No aneurysm. No enlarged lymph nodes. Reproductive: Normal size prostate. Other: Pigtail catheter has been inserted since the prior study, decompressing the collection anterior to the left iliac vessels. There is mild adjacent stranding, also improved. No new abdominal or pelvic fluid collections. No ascites. Musculoskeletal: Extensive postsurgical changes with fusion from the lower thoracic spine through the upper sacrum. Findings consistent with discitis/osteomyelitis of the lower thoracic spine, similar to the prior CT. IMPRESSION: 1. Left pelvic collection has been decompressed with a percutaneous pigtail catheter. Adjacent inflammatory changes have decreased from the prior CT. 2. No new or acute abnormalities within the abdomen or pelvis. 3.  Changes of discitis/osteomyelitis of the lower thoracic spine are similar to the prior CT. Electronically Signed   By: Amie Portland M.D.   On: 03/21/2021 16:31     Assessment/Plan: Thoracic osteomyelitis/ SEA s/p decompressive laminectomy-with e.coli = has been on abtx x 9  wks. No recent positive cultures. From id standpoint, imaging shows persistent inflammation however some areas are improved/ can undergo surgery now for further stabilization. Will plan to continue cefazolin.  [facility] Pager: [phone]  03/23/2021, 3:19 PM

## 2021-03-24 DIAGNOSIS — M4644 Discitis, unspecified, thoracic region: Secondary | ICD-10-CM | POA: Diagnosis not present

## 2021-03-24 DIAGNOSIS — K5903 Drug induced constipation: Secondary | ICD-10-CM | POA: Diagnosis not present

## 2021-03-24 LAB — GLUCOSE, CAPILLARY: Glucose-Capillary: 164 mg/dL — ABNORMAL HIGH (ref 70–99)

## 2021-03-24 NOTE — Progress Notes (Signed)
HD#9 SUBJECTIVE:  Patient Summary: Marco Kennedy is a 70 y.o. with a pertinent PMH of chronic low back pain and hypertension, who presented for MRI follow-up and had worsening lower back pain and admitted for epidural abscess.   Overnight Events: None  Interim History: Patient reports that he was able to have only very minimal bowel movement with bisacodyl suppository yesterday.  He is hopeful that with relief of his constipation, he will have relief and some abdominal pain and discomfort.  OBJECTIVE:  Vital Signs: Vitals:   03/23/21 2014 03/23/21 2330 03/24/21 1044 03/24/21 1216  BP: 137/89 135/85 135/82 123/77  Pulse: (!) 105 99 (!) 106 (!) 106  Resp: 18 20 19 17   Temp: 98 F (36.7 C) 98.1 F (36.7 C) 98.4 F (36.9 C) 97.6 F (36.4 C)  TempSrc: Oral Oral Oral Oral  SpO2: 99% 99% 99% 96%  Weight:      Height:       Supplemental O2: Room Air SpO2: 96 % O2 Flow Rate (L/min): 2 L/min  Filed Weights   03/15/21 0014  Weight: 106.1 kg     Intake/Output Summary (Last 24 hours) at 03/24/2021 1303 Last data filed at 03/24/2021 03/26/2021 Gross per 24 hour  Intake 6 ml  Output 500 ml  Net -494 ml   Net IO Since Admission: -2,856.85 mL [03/24/21 1303]  Physical Exam: Constitutional: Elderly gentleman laying comfortably in bed.  No acute distress noted. Cardio: Regular rate and rhythm.  No murmurs, rubs, gallops. Pulm: Clear to auscultation bilaterally.  Normal work of breathing on room air. Abdomen: Soft, mild tenderness over left side near her drain was placed, nondistended. MSK: Negative for bilateral lower extremity edema. Skin: Skin is warm and dry. Neuro: Alert and oriented x3.  No focal deficit noted. Psych: Appropriate mood and affect.  Patient Lines/Drains/Airways Status     Active Line/Drains/Airways     Name Placement date Placement time Site Days   Peripheral IV 03/14/21 20 G 1.88" Anterior;Left;Proximal Forearm 03/14/21  1549  Forearm  10   PICC Single  Lumen 01/15/21 Right Basilic 39 cm 0 cm 01/15/21  01/17/21  Basilic  68   Closed System Drain 1 Left Flank Bulb (JP) 12 Fr. 01/27/21  1407  Flank  56   Closed System Drain Lateral LLQ Bulb (JP) 10 Fr. 03/16/21  1335  LLQ  8   Incision (Closed) 04/18/20 Flank Left 04/18/20  1419  -- 340   Incision (Closed) 01/13/21 Back 01/13/21  1016  -- 70   Incision (Closed) 03/14/21 Back 03/14/21  2006  -- 10   Wound / Incision (Open or Dehisced) 01/29/21 Non-pressure wound;Other (Comment) Buttocks Left 01/29/21  0900  Buttocks  54             ASSESSMENT/PLAN:  Assessment: Active Problems:   Vertebral osteomyelitis (HCC)   Empyema (HCC)   Sepsis due to Escherichia coli (E. coli) (HCC)   Retroperitoneal fluid collection   Constipation   Epidural abscess   Discitis of thoracic region   Plan: #Epidural abscess #Retroperitoneal fluid collection Fluid culture and blood cultures showed no growth.  He continues to endorse significant pain.  MRI on 9/24 showed findings concerning for persistent epidural abscess with associated osteomyelitis discitis at T9-T10 and T10-T11.  CT abdomen/pelvis showed previous left pelvic collection that has been decompressed with percutaneous pigtail catheter with adjacent laboratory changes that have decreased from prior imaging. -Neurosurgery on board, appreciate their recommendations. -Patient is on bed rest with  exception of mobilization to bedside commode.  Continue to work with PT in bed. -TSLO brace when out of bed and as needed -Patient will undergo thoracic stabilization surgery on Tuesday, March 31, 2021 -ID on board, appreciate their recommendations             -Continue cefazolin 2 g IV every 8 hours -Trend inflammatory markers every 48 hours   #Constipation Patient reports very small bowel movement yesterday with Dulcolax suppository. Unfortunately pain management with opioids is not helping with his constipation. -Continue bowel regimen of Dulcolax  suppository 10 mg rectal daily, lactulose enema 300 mL rectal daily as needed, lubiprostone 24 mcg oral twice daily, MiraLAX daily, senna-docusate 2 tablets oral daily for bowel regimen   #Hypertension Chronically managed.  BP of 135/85 today. -Continue amlodipine 5 mg daily    Best Practice: Diet: Regular diet IVF: Fluids: none VTE: enoxaparin (LOVENOX) injection 40 mg Start: 03/20/21 0951 SCDs Start: 03/14/21 1708 Code: Full AB: Cefazolin 2 g IV every 8 hours DISPO: Continue to monitor inpatient.  Signature: Champ Mungo, D.O.  Internal Medicine Resident, PGY-1 [facility]  Pager: [phone] 1:03 PM, 03/24/2021   Please contact the on call pager after 5 pm and on weekends at [phone].

## 2021-03-24 NOTE — Progress Notes (Signed)
Occupational Therapy Treatment Patient Details Name: Marco Kennedy MRN: [id number] DOB: 10/02/1950 Today's Date: 03/24/2021   History of present illness Pt is a 70 y.o. 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 He is now s/p T9-T10 laminectomy for decompression of thecal sac and debridement of abscess 9/18. Drain placed in left retroperitoneal space on 03/17/19 on bulb suction. Pt had back sx  01/2020 with Dr. Venetia Maxon; admission 7/16-22 for sepsis and again 7/26 for SOB and fever. PMHx: back sx, arthritis, degenerative lumbar spinal stenosis, THA 2017.   OT comments  Pt is incrementally progressing and continues to be severely limited by back and abdominal pain as well as BLE weakness. Pt needs maximal verbal cues throughout mobility and functional tasks to maintain back precautions, he is impulsive when in pain. He continues to require mod-max A +2 for all bed mobility, and while sitting EOB close min guard for grooming tasks. He is unable to complete bimanual tasks while sitting EIB due to pain when he is not supporting himself with at least 1UE. He continues to benefit from OT acutely. D/c plan remains appropriate.    Recommendations for follow up therapy are one component of a multi-disciplinary discharge planning process, led by the attending physician.  Recommendations may be updated based on patient status, additional functional criteria and insurance authorization.    Follow Up Recommendations  CIR    Equipment Recommendations  None recommended by OT       Precautions / Restrictions Precautions Precautions: Fall;Back Precaution Booklet Issued: No Precaution Comments: drain removed Required Braces or Orthoses: Spinal Brace Spinal Brace: Thoracolumbosacral orthotic;Other (comment) Spinal Brace Comments: per Dr Venetia Maxon note 9/23, does not need TLSO in supine, only when OOB/EOB Restrictions Weight Bearing Restrictions: No        Mobility Bed Mobility Overal bed mobility: Needs Assistance Bed Mobility: Rolling;Sidelying to Sit;Sit to Sidelying Rolling: Mod assist;+2 for physical assistance;+2 for safety/equipment Sidelying to sit: Max assist;+2 for physical assistance;+2 for safety/equipment     Sit to sidelying: Max assist;+2 for physical assistance;+2 for safety/equipment General bed mobility comments: max A verbal cues throughout. Pt has difficulty maintaining back precautions during mobiliy    Transfers                      Balance Overall balance assessment: Needs assistance Sitting-balance support: Single extremity supported;Feet supported Sitting balance-Leahy Scale: Poor Sitting balance - Comments: reliant on at least on eUE supported and close min guard                                   ADL either performed or assessed with clinical judgement   ADL Overall ADL's : Needs assistance/impaired     Grooming: Min guard;Set up;Sitting Grooming Details (indicate cue type and reason): required set up of task, pt unable to complete bimanual task due to pain when unsupported with at least 1UE         Upper Body Dressing : Total assistance Upper Body Dressing Details (indicate cue type and reason): total A for TLSO this in sitting this session                 Functional mobility during ADLs: Maximal assistance;+2 for physical assistance;+2 for safety/equipment General ADL Comments: oral hygiene at the EOB this sesison. Pt only tolerated about 7 minutes of sitting this session.  Max A +2 for all bed mobility      Cognition Arousal/Alertness: Awake/alert Behavior During Therapy: WFL for tasks assessed/performed;Anxious Overall Cognitive Status: Within Functional Limits for tasks assessed           General Comments: A&O, although pt able to recall precautions he has limited carryover and requires max cueing to maintain precautions during mobility and  repositioning              General Comments VSS on RA, pt's wife present throughout session. Upon sitting EOB pt's incision site looked abnormal, RN notifed and check during session    Pertinent Vitals/ Pain       Pain Assessment: Faces Faces Pain Scale: Hurts worst Pain Location: lower back, abdomen Pain Descriptors / Indicators: Guarding;Grimacing Pain Intervention(s): Monitored during session;Limited activity within patient's tolerance;Repositioned;Patient requesting pain meds-RN notified   Frequency  Min 2X/week        Progress Toward Goals  OT Goals(current goals can now be found in the care plan section)  Progress towards OT goals: Progressing toward goals  Acute Rehab OT Goals Patient Stated Goal: less pain OT Goal Formulation: With patient Time For Goal Achievement: 03/30/21 Potential to Achieve Goals: Fair ADL Goals Pt Will Perform Grooming: with supervision;standing Pt Will Perform Lower Body Bathing: with min assist;sit to/from stand Pt Will Perform Lower Body Dressing: with min assist;with adaptive equipment;sit to/from stand Pt Will Transfer to Toilet: with supervision;ambulating Pt Will Perform Toileting - Clothing Manipulation and hygiene: with modified independence;sitting/lateral leans  Plan Discharge plan remains appropriate;Frequency remains appropriate       AM-PAC OT "6 Clicks" Daily Activity     Outcome Measure   Help from another person eating meals?: Total Help from another person taking care of personal grooming?: A Little Help from another person toileting, which includes using toliet, bedpan, or urinal?: A Lot Help from another person bathing (including washing, rinsing, drying)?: A Lot Help from another person to put on and taking off regular upper body clothing?: A Little Help from another person to put on and taking off regular lower body clothing?: A Lot 6 Click Score: 13    End of Session Equipment Utilized During Treatment: Back  brace  OT Visit Diagnosis: Unsteadiness on feet (R26.81);Other abnormalities of gait and mobility (R26.89);Muscle weakness (generalized) (M62.81);Repeated falls (R29.6);Pain   Activity Tolerance Patient limited by pain   Patient Left in bed;with call bell/phone within reach;with bed alarm set;with family/visitor present   Nurse Communication Mobility status (sx site)        Time: 0071-2197 OT Time Calculation (min): 25 min  Charges: OT General Charges $OT Visit: 1 Visit OT Treatments $Self Care/Home Management : 8-22 mins   Ilsa Bonello A Zeppelin Commisso 03/24/2021, 4:30 PM

## 2021-03-24 NOTE — Progress Notes (Signed)
Pt states they will self admin CPAP tonight. RT will cont to monitor.

## 2021-03-24 NOTE — Progress Notes (Signed)
Physical Therapy Treatment Patient Details Name: Marco Kennedy MRN: [id number] DOB: August 30, 1950 Today's Date: 03/24/2021   History of Present Illness Pt is a 70 y.o. 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 He is now s/p T9-T10 laminectomy for decompression of thecal sac and debridement of abscess 9/18. Drain placed in left retroperitoneal space on 03/17/19 on bulb suction. Pt had back sx  01/2020 with Dr. Venetia Maxon; admission 7/16-22 for sepsis and again 7/26 for SOB and fever. PMHx: back sx, arthritis, degenerative lumbar spinal stenosis, THA 2017.    PT Comments    Pt received in supine, agreeable to therapy session and with good participation and fair tolerance for bed mobility and lateral seated scoot training. Pt very pain limited this date and unsafe/unable to attempt sit<>stand, pt able to perform each mobility task with up to +2 maxA and brace donned while EOB with totalA. Pt with poor pain tolerance throughout and needs multimodal cues for precaution compliance. Pt continues to benefit from PT services to progress toward functional mobility goals.    Recommendations for follow up therapy are one component of a multi-disciplinary discharge planning process, led by the attending physician.  Recommendations may be updated based on patient status, additional functional criteria and insurance authorization.  Follow Up Recommendations  CIR;Supervision/Assistance - 24 hour (pending progress post-op)     Equipment Recommendations  Other (comment) (TBD)    Recommendations for Other Services Rehab consult     Precautions / Restrictions Precautions Precautions: Fall;Back Precaution Booklet Issued: No Precaution Comments: drain removed Required Braces or Orthoses: Spinal Brace Spinal Brace: Thoracolumbosacral orthotic;Other (comment) Spinal Brace Comments: per Dr Venetia Maxon note 9/23, does not need TLSO in supine, only when  OOB/EOB Restrictions Weight Bearing Restrictions: No     Mobility  Bed Mobility Overal bed mobility: Needs Assistance Bed Mobility: Rolling;Sidelying to Sit;Sit to Sidelying Rolling: Mod assist;+2 for physical assistance;+2 for safety/equipment Sidelying to sit: Max assist;+2 for physical assistance;+2 for safety/equipment     Sit to sidelying: Max assist;+2 for physical assistance;+2 for safety/equipment General bed mobility comments: max A verbal cues throughout. Pt has difficulty maintaining back precautions during mobility    Transfers Overall transfer level: Needs assistance   Transfers: Lateral/Scoot Transfers          Lateral/Scoot Transfers: Max assist;+2 physical assistance General transfer comment: defer standing due to pain/weakness; emphasis on seated scooting toward HOB, pt able to achieve with +2 transfer pad assist and cues/physical assist for proper hand and foot placement.  Ambulation/Gait                 Stairs             Wheelchair Mobility    Modified Rankin (Stroke Patients Only)       Balance Overall balance assessment: Needs assistance Sitting-balance support: Single extremity supported;Feet supported Sitting balance-Leahy Scale: Poor Sitting balance - Comments: reliant on at least on U UE supported and close min guard but pt prefers BUE support due to low back pain                                    Cognition Arousal/Alertness: Awake/alert Behavior During Therapy: WFL for tasks assessed/performed;Anxious Overall Cognitive Status: Within Functional Limits for tasks assessed  General Comments: A&O, although pt able to recall precautions he has limited carryover and requires max cueing to maintain precautions during mobility and repositioning      Exercises General Exercises - Lower Extremity Quad Sets: AROM;Both;10 reps;Supine Toe Raises: Both;5 reps;Seated;Other  (comment);AROM Heel Raises: Both;5 reps;Seated;AROM Other Exercises Other Exercises: seated BLE AROM: LAQ x3-5 reps ea    General Comments General comments (skin integrity, edema, etc.): VSS on RA      Pertinent Vitals/Pain Pain Assessment: Faces Faces Pain Scale: Hurts worst Pain Location: lower back, abdomen Pain Descriptors / Indicators: Guarding;Grimacing Pain Intervention(s): Limited activity within patient's tolerance;Monitored during session;Repositioned;Patient requesting pain meds-RN notified    Home Living                      Prior Function            PT Goals (current goals can now be found in the care plan section) Acute Rehab PT Goals Patient Stated Goal: less pain PT Goal Formulation: With patient Time For Goal Achievement: 03/30/21 Potential to Achieve Goals: Fair Progress towards PT goals: Not progressing toward goals - comment    Frequency    Min 5X/week      PT Plan Current plan remains appropriate    Co-evaluation PT/OT/SLP Co-Evaluation/Treatment: Yes Reason for Co-Treatment: Complexity of the patient's impairments (multi-system involvement);Necessary to address cognition/behavior during functional activity;For patient/therapist safety;To address functional/ADL transfers PT goals addressed during session: Mobility/safety with mobility;Balance;Strengthening/ROM        AM-PAC PT "6 Clicks" Mobility   Outcome Measure  Help needed turning from your back to your side while in a flat bed without using bedrails?: A Lot Help needed moving from lying on your back to sitting on the side of a flat bed without using bedrails?: Total Help needed moving to and from a bed to a chair (including a wheelchair)?: Total Help needed standing up from a chair using your arms (e.g., wheelchair or bedside chair)?: Total Help needed to walk in hospital room?: Total Help needed climbing 3-5 steps with a railing? : Total 6 Click Score: 7    End of Session    Activity Tolerance: Patient limited by pain Patient left: in bed;with call bell/phone within reach;with bed alarm set;with SCD's reapplied Nurse Communication: Mobility status;Patient requests pain meds PT Visit Diagnosis: Other abnormalities of gait and mobility (R26.89);Pain Pain - part of body:  (back, abdomen)     Time: 0511-0211 PT Time Calculation (min) (ACUTE ONLY): 31 min  Charges:  $Therapeutic Activity: 8-22 mins                     Kolbi Altadonna P., PTA Acute Rehabilitation Services Pager: [phone] Office: [phone]    Angus Palms 03/24/2021, 5:12 PM

## 2021-03-24 NOTE — Progress Notes (Signed)
Subjective: Patient reports that he is doing fair. He is frustrated with his current situation. Pain continues to be severe and mobility continues to be severely limited.   Objective: Vital signs in last 24 hours: Temp:  [97.8 F (36.6 C)-98.1 F (36.7 C)] 98.1 F (36.7 C) (09/26 2330) Pulse Rate:  [99-108] 99 (09/26 2330) Resp:  [18-20] 20 (09/26 2330) BP: (117-137)/(80-89) 135/85 (09/26 2330) SpO2:  [94 %-99 %] 99 % (09/26 2330)  Intake/Output from previous day: 09/26 0701 - 09/27 0700 In: 6 [I.V.:6] Out: 500 [Urine:500] Intake/Output this shift: No intake/output data recorded.  Physical Exam: Patient is awake, A/O X 4, and conversant. He is in NAD with VSS. Speech is fluent and appropriate. MAEW with good strength. PERLA, EOMI. CNs grossly intact. Dressing is CDI.  Lab Results: Recent Labs    03/22/21 0419 03/23/21 0555  WBC 8.8 9.5  HGB 10.9* 10.9*  HCT 34.8* 34.5*  PLT 465* 453*   BMET Recent Labs    03/22/21 0419 03/23/21 0515  NA 134* 134*  K 5.0 3.9  CL 94* 95*  CO2 32 30  GLUCOSE 128* 136*  BUN 10 14  CREATININE 0.80 0.67  CALCIUM 9.4 8.9    Studies/Results: No results found.  Assessment/Plan: 70 y.o. male who is POD#9 s/p T9-10 laminectomy for SEA. The patient remains at his baseline neurological status. Back pain and BLE pain continue to be severe. Ambulation and mobility are severely limited due to pain. 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 IR perc drain for retroperitoneal fluid wasx removed yesterday and reported to be resolved. Constipation improving. Plan to stabilize his thoracic spine on 03/31/2021.    PLAN: - Continue Abx per ID - Continue working with PT/OT in the bed - Continue working on pain control - Encourage mobilization as tolerated - TLSO brace when OOB and PRN, he does not need to wear the brace while in bed - Thoracic stabilization surgery scheduled for Tuesday, 03/31/2021  Drain removed  LOS: 9  days     Council Mechanic, DNP, NP-C 03/24/2021, 8:06 AM

## 2021-03-25 ENCOUNTER — Other Ambulatory Visit: Payer: Self-pay | Admitting: Neurosurgery

## 2021-03-25 DIAGNOSIS — M4644 Discitis, unspecified, thoracic region: Secondary | ICD-10-CM | POA: Diagnosis not present

## 2021-03-25 DIAGNOSIS — K5903 Drug induced constipation: Secondary | ICD-10-CM | POA: Diagnosis not present

## 2021-03-25 LAB — GLUCOSE, CAPILLARY: Glucose-Capillary: 152 mg/dL — ABNORMAL HIGH (ref 70–99)

## 2021-03-25 LAB — C-REACTIVE PROTEIN: CRP: 8.2 mg/dL — ABNORMAL HIGH (ref <1.0)

## 2021-03-25 NOTE — Progress Notes (Signed)
HD#10 SUBJECTIVE:  Patient Summary: Marco Kennedy is a 70 y.o. with a pertinent PMH of chronic low back pain and hypertension, who presented for MRI follow-up and had worsening lower back pain and admitted for epidural abscess.   Overnight Events: None  Interim History: Patient reports that he was able to have multiple bowel movements at the last.  He does continue to endorse severe pain, especially when working with physical therapy.  He wishes to time his pain medication prior to physical therapy sessions to help alleviate some of this pain.  OBJECTIVE:  Vital Signs: Vitals:   03/24/21 2029 03/24/21 2330 03/25/21 0352 03/25/21 0810  BP: 132/83 121/83 128/85 (!) 156/89  Pulse: 95 97 85 93  Resp: 18 18 16 17   Temp: 98.3 F (36.8 C) 98.2 F (36.8 C) 98.5 F (36.9 C) (!) 97.5 F (36.4 C)  TempSrc: Oral Oral Oral Oral  SpO2: 96%  97% 97%  Weight:      Height:       Supplemental O2: Room Air SpO2: 97 % O2 Flow Rate (L/min): 2 L/min  Filed Weights   03/15/21 0014  Weight: 106.1 kg     Intake/Output Summary (Last 24 hours) at 03/25/2021 0855 Last data filed at 03/25/2021 0535 Gross per 24 hour  Intake 120 ml  Output 600 ml  Net -480 ml   Net IO Since Admission: -3,336.85 mL [03/25/21 0855]  Physical Exam: Constitutional: Elderly gentleman lying in bed.  No acute distress noted. Cardio: Regular rate and rhythm.  No murmurs, rubs, gallops. Pulm: Clear to auscultation bilaterally.  Normal work of breathing on room air. Abdomen: Soft, nondistended, minimal tenderness to palpation. MSK: SCDs on bilateral lower extremities.  No lower extremity edema noted. Skin: Skin is warm and dry. Neuro: Alert and oriented x3.  No focal deficit noted. Psych: Appropriate mood and affect.  Patient Lines/Drains/Airways Status     Active Line/Drains/Airways     Name Placement date Placement time Site Days   Peripheral IV 03/14/21 20 G 1.88" Anterior;Left;Proximal Forearm 03/14/21   1549  Forearm  11   PICC Single Lumen 01/15/21 Right Basilic 39 cm 0 cm 01/15/21  01/17/21  Basilic  69   Closed System Drain 1 Left Flank Bulb (JP) 12 Fr. 01/27/21  1407  Flank  57   Closed System Drain Lateral LLQ Bulb (JP) 10 Fr. 03/16/21  1335  LLQ  9   External Urinary Catheter 03/24/21  1440  --  1   Incision (Closed) 04/18/20 Flank Left 04/18/20  1419  -- 341   Incision (Closed) 01/13/21 Back 01/13/21  1016  -- 71   Incision (Closed) 03/14/21 Back 03/14/21  2006  -- 11   Wound / Incision (Open or Dehisced) 01/29/21 Non-pressure wound;Other (Comment) Buttocks Left 01/29/21  0900  Buttocks  55             ASSESSMENT/PLAN:  Assessment: Active Problems:   Vertebral osteomyelitis (HCC)   Empyema (HCC)   Sepsis due to Escherichia coli (E. coli) (HCC)   Retroperitoneal fluid collection   Constipation   Epidural abscess   Discitis of thoracic region   Plan: #Epidural abscess #Retroperitoneal fluid collection Fluid culture and blood cultures showed no growth.  He continues to endorse significant pain.   -Neurosurgery on board, appreciate their recommendations. -Patient is on bed rest with exception of mobilization to bedside commode.  Continue to work with PT in bed. -TSLO brace when out of bed and as needed -  Patient will undergo thoracic stabilization surgery on Tuesday, March 31, 2021 -ID on board, appreciate their recommendations             -Continue cefazolin 2 g IV every 8 hours -Trend inflammatory markers every 48 hours   #Constipation Patient reports that he was able to have multiple bowel movements yesterday.  He endorses that he will continue to ask for as needed bowel regimen. -Continue bowel regimen of Dulcolax suppository 10 mg rectal daily, lactulose enema 300 mL rectal daily as needed, lubiprostone 24 mcg oral twice daily, MiraLAX daily, senna-docusate 2 tablets oral daily for bowel regimen   #Hypertension Chronically managed.  BP of 128/85 today. -Continue  amlodipine 5 mg daily  Best Practice: Diet: Regular diet IVF: Fluids: None VTE: enoxaparin (LOVENOX) injection 40 mg Start: 03/20/21 0951 SCDs Start: 03/14/21 1708 Code: Full AB: Cefazolin 2 g IV 3 times daily DISPO: Continue to monitor inpatient.   Signature: Champ Mungo, D.O.  Internal Medicine Resident, PGY-1 [facility]  Pager: [phone] 8:55 AM, 03/25/2021   Please contact the on call pager after 5 pm and on weekends at [phone].

## 2021-03-25 NOTE — Progress Notes (Signed)
PT Cancellation Note  Patient Details Name: Marco Kennedy MRN: [id number] DOB: Apr 15, 1951   Cancelled Treatment:    Reason Eval/Treat Not Completed: Pain limiting ability to participate.  Pt wishes to be medicated then will have therapy.  Follow up at another time.   Ivar Drape 03/25/2021, 11:23 AM  Samul Dada, PT MS Acute Rehab Dept. Number: [facility] [id number] and [facility] [phone]

## 2021-03-25 NOTE — Progress Notes (Signed)
Physical Therapy Treatment Patient Details Name: Marco Kennedy MRN: [id number] DOB: 1951-04-19 Today's Date: 03/25/2021   History of Present Illness Pt is a 70 y.o. 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 He is now s/p T9-T10 laminectomy for decompression of thecal sac and debridement of abscess 9/18. Drain placed in left retroperitoneal space on 03/17/19 on bulb suction. Pt had back sx  01/2020 with Dr. Venetia Maxon; admission 7/16-22 for sepsis and again 7/26 for SOB and fever. PMHx: back sx, arthritis, degenerative lumbar spinal stenosis, THA 2017.    PT Comments    Pt was seen for bed ex routine since pt is medicated but still in some pain.  Has agreed to exercise routine and demonstrates some difficulty with following directions as well as having dense hip and knee strength changes.  Pt is AAROM for LE's and will need to work on strength to recover his independence with gait.  Follow along with him with a reduction in frequency to be reassessed when surgery is done.  Encourage pt to move with nursing staff if possible.     Recommendations for follow up therapy are one component of a multi-disciplinary discharge planning process, led by the attending physician.  Recommendations may be updated based on patient status, additional functional criteria and insurance authorization.  Follow Up Recommendations  CIR;Supervision/Assistance - 24 hour     Equipment Recommendations  Other (comment) (determine in next venue)    Recommendations for Other Services Rehab consult     Precautions / Restrictions Precautions Precautions: Fall;Back Precaution Booklet Issued: No Precaution Comments: drain removed Required Braces or Orthoses: Spinal Brace Spinal Brace: Thoracolumbosacral orthotic;Other (comment) Spinal Brace Comments: TLSO when OOB only Restrictions Weight Bearing Restrictions: No     Mobility  Bed Mobility Overal bed  mobility: Needs Assistance Bed Mobility:  (scooting up in bed)           General bed mobility comments: Pt is max assist to reposition up in bed    Transfers Overall transfer level: Needs assistance               General transfer comment: deferred due to not getting OOB  Ambulation/Gait             General Gait Details: unable   Stairs             Wheelchair Mobility    Modified Rankin (Stroke Patients Only)       Balance                                            Cognition Arousal/Alertness: Awake/alert Behavior During Therapy: WFL for tasks assessed/performed;Anxious Overall Cognitive Status: Within Functional Limits for tasks assessed                                 General Comments: pt agreed to do ex's and was able to follow instructions well      Exercises General Exercises - Lower Extremity Ankle Circles/Pumps: AAROM;5 reps Quad Sets: AROM;10 reps Gluteal Sets: AROM;10 reps;Other (comment) (neutral spine in bed) Heel Slides: AAROM;10 reps Hip ABduction/ADduction: AAROM;10 reps    General Comments General comments (skin integrity, edema, etc.): instructed pt on breathing as he tries to use breath holding while moving legs  Pertinent Vitals/Pain Pain Assessment: Faces Faces Pain Scale: Hurts whole lot Pain Location: lower back, abdomen Pain Descriptors / Indicators: Grimacing;Guarding Pain Intervention(s): Monitored during session    Home Living                      Prior Function            PT Goals (current goals can now be found in the care plan section) Acute Rehab PT Goals Patient Stated Goal: less pain Progress towards PT goals: Not progressing toward goals - comment    Frequency    Min 3X/week      PT Plan Frequency needs to be updated    Co-evaluation              AM-PAC PT "6 Clicks" Mobility   Outcome Measure  Help needed turning from your back to  your side while in a flat bed without using bedrails?: A Lot Help needed moving from lying on your back to sitting on the side of a flat bed without using bedrails?: A Lot Help needed moving to and from a bed to a chair (including a wheelchair)?: Total Help needed standing up from a chair using your arms (e.g., wheelchair or bedside chair)?: Total Help needed to walk in hospital room?: Total Help needed climbing 3-5 steps with a railing? : Total 6 Click Score: 8    End of Session   Activity Tolerance: Patient limited by fatigue Patient left: in bed;with call bell/phone within reach;with bed alarm set Nurse Communication: Mobility status;Precautions PT Visit Diagnosis: Muscle weakness (generalized) (M62.81);Difficulty in walking, not elsewhere classified (R26.2)     Time: 1350-1410 PT Time Calculation (min) (ACUTE ONLY): 20 min  Charges:  $Therapeutic Exercise: 8-22 mins                     Ivar Drape 03/25/2021, 5:03 PM  Samul Dada, PT MS Acute Rehab Dept. Number: [facility] [id number] and [facility] [phone]

## 2021-03-25 NOTE — Progress Notes (Signed)
Subjective: Patient reports back pain and limited mobility persists. No acute events overnight.   Objective: Vital signs in last 24 hours: Temp:  [97.5 F (36.4 C)-98.6 F (37 C)] 97.5 F (36.4 C) (09/28 0810) Pulse Rate:  [85-106] 93 (09/28 0810) Resp:  [16-19] 17 (09/28 0810) BP: (121-156)/(77-89) 156/89 (09/28 0810) SpO2:  [95 %-99 %] 97 % (09/28 0810)  Intake/Output from previous day: 09/27 0701 - 09/28 0700 In: 120 [P.O.:120] Out: 600 [Urine:600] Intake/Output this shift: No intake/output data recorded.   Physical Exam: Patient is awake, A/O X 4, and conversant. He is in NAD with VSS. Speech is fluent and appropriate. MAEW with good strength. PERLA, EOMI. CNs grossly intact. Dressing is CDI. Incision is well approximated with no drainage, erythema, or edema.   Lab Results: Recent Labs    03/23/21 0555  WBC 9.5  HGB 10.9*  HCT 34.5*  PLT 453*   BMET Recent Labs    03/23/21 0515  NA 134*  K 3.9  CL 95*  CO2 30  GLUCOSE 136*  BUN 14  CREATININE 0.67  CALCIUM 8.9    Studies/Results: No results found.  Assessment/Plan: 70 y.o. male who is POD#10 s/p T9-10 laminectomy for SEA. The patient remains at his baseline neurological status. Back pain and BLE pain continue to be severe and limiting mobility and ambulation. 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 Constipation improving. Plan to stabilize his thoracic spine on 03/31/2021.      PLAN: - Continue Abx per ID - Continue working with PT/OT in the bed - Continue working on pain control - Encourage mobilization as tolerated - TLSO brace when OOB and PRN, he does not need to wear the brace while in bed - Thoracic stabilization surgery scheduled for Tuesday, 03/31/2021  LOS: 10 days     Council Mechanic, DNP, NP-C 03/25/2021, 9:26 AM

## 2021-03-25 NOTE — Progress Notes (Signed)
Pt self administers his CPAP.

## 2021-03-26 DIAGNOSIS — K5903 Drug induced constipation: Secondary | ICD-10-CM | POA: Diagnosis not present

## 2021-03-26 DIAGNOSIS — M4644 Discitis, unspecified, thoracic region: Secondary | ICD-10-CM | POA: Diagnosis not present

## 2021-03-26 DIAGNOSIS — G062 Extradural and subdural abscess, unspecified: Secondary | ICD-10-CM | POA: Diagnosis not present

## 2021-03-26 NOTE — Progress Notes (Signed)
HD#11 SUBJECTIVE:  Patient Summary: Marco Kennedy is a 70 y.o. with a pertinent PMH of chronic low back pain and hypertension, who presented for MRI follow-up and had worsening lower back pain and admitted for epidural abscess.   Overnight Events: None  Interim History: Patient reports feeling better today, stating that he feels that it may be accommodation of constipation relief and not getting out of bed with physical therapy yesterday.  Is hopeful to have continued well-managed pain.  He has had success over the last several days and having bowel movements with current bowel regimen, intends to have another suppository this morning.  OBJECTIVE:  Vital Signs: Vitals:   03/25/21 2001 03/26/21 0008 03/26/21 0404 03/26/21 0726  BP: 125/84 135/82 124/90 110/78  Pulse: 99 100 (!) 105 (!) 110  Resp: 18 20 20 18   Temp: 98.6 F (37 C) 98.5 F (36.9 C) 98.2 F (36.8 C) 98.2 F (36.8 C)  TempSrc: Oral Oral Oral Oral  SpO2: 97% 95% 95% 96%  Weight:      Height:       Supplemental O2: Room Air SpO2: 96 % O2 Flow Rate (L/min): 2 L/min  Filed Weights   03/15/21 0014  Weight: 106.1 kg     Intake/Output Summary (Last 24 hours) at 03/26/2021 1018 Last data filed at 03/26/2021 0600 Gross per 24 hour  Intake --  Output 750 ml  Net -750 ml   Net IO Since Admission: -4,086.85 mL [03/26/21 1018]  Physical Exam: Constitutional: Elderly gentleman sitting up in bed.  No acute distress noted. Cardio: Regular rate and rhythm.  No murmurs, rubs, gallops. Pulm: Clear to auscultation bilaterally.  Normal work of breathing on room air. Abdomen: Soft, nontender, nondistended. MSK: Negative for edema to bilateral lower extremities. Skin: Skin is warm and dry. Neuro: Alert and oriented x3.  No focal deficit noted. Psych: Appropriate mood and affect.  Patient seems in better spirits today than previous days.  Patient Lines/Drains/Airways Status     Active Line/Drains/Airways     Name  Placement date Placement time Site Days   Peripheral IV 03/14/21 20 G 1.88" Anterior;Left;Proximal Forearm 03/14/21  1549  Forearm  12   PICC Single Lumen 01/15/21 Right Basilic 39 cm 0 cm 01/15/21  01/17/21  Basilic  70   Closed System Drain 1 Left Flank Bulb (JP) 12 Fr. 01/27/21  1407  Flank  58   Closed System Drain Lateral LLQ Bulb (JP) 10 Fr. 03/16/21  1335  LLQ  10   External Urinary Catheter 03/24/21  1440  --  2   Incision (Closed) 04/18/20 Flank Left 04/18/20  1419  -- 342   Incision (Closed) 01/13/21 Back 01/13/21  1016  -- 72   Incision (Closed) 03/14/21 Back 03/14/21  2006  -- 12   Wound / Incision (Open or Dehisced) 01/29/21 Non-pressure wound;Other (Comment) Buttocks Left 01/29/21  0900  Buttocks  56             ASSESSMENT/PLAN:  Assessment: Active Problems:   Vertebral osteomyelitis (HCC)   Empyema (HCC)   Sepsis due to Escherichia coli (E. coli) (HCC)   Retroperitoneal fluid collection   Constipation   Epidural abscess   Discitis of thoracic region   Plan: #Epidural abscess #Retroperitoneal fluid collection Patient states today that between relief of constipation and not getting out of bed with physical therapy yesterday, his pain has been much more tolerable. -Neurosurgery on board, appreciate their recommendations. -Patient is on bed rest with  exception of mobilization to bedside commode.  Continue to work with PT in bed. -TSLO brace when out of bed and as needed -Patient will undergo thoracic stabilization surgery on Tuesday, March 31, 2021 -ID on board, appreciate their recommendations             -Continue cefazolin 2 g IV every 8 hours -Trend inflammatory markers every 48 hours   #Constipation Patient reports that he was able to have multiple bowel movements yesterday.  He intends to receive an additional suppository this morning. -Continue bowel regimen of Dulcolax suppository 10 mg rectal daily, lactulose enema 300 mL rectal daily as needed,  lubiprostone 24 mcg oral twice daily, MiraLAX daily, senna-docusate 2 tablets oral daily for bowel regimen -If patient is able to continue to tolerate sitting up in bed, encourage increased fiber intake in diet   #Hypertension Chronically managed.  BP of 124/90 today. -Continue amlodipine 5 mg daily  Best Practice: Diet: Regular diet IVF: Fluids: None VTE: enoxaparin (LOVENOX) injection 40 mg Start: 03/20/21 0951 SCDs Start: 03/14/21 1708 Code: Full AB: Cefazolin 2 g IV 3 times daily DISPO: Continue to monitor inpatient.  Signature: Champ Mungo, D.O.  Internal Medicine Resident, PGY-1 [facility]  Pager: [phone] 10:18 AM, 03/26/2021   Please contact the on call pager after 5 pm and on weekends at [phone].

## 2021-03-26 NOTE — Progress Notes (Signed)
Subjective: Patient reports that he is stable. He is eager to proceed with surgery. No acute events overnight.   Objective: Vital signs in last 24 hours: Temp:  [97.9 F (36.6 C)-98.6 F (37 C)] 98.2 F (36.8 C) (09/29 0726) Pulse Rate:  [93-110] 110 (09/29 0726) Resp:  [17-20] 18 (09/29 0726) BP: (110-135)/(70-90) 110/78 (09/29 0726) SpO2:  [95 %-97 %] 96 % (09/29 0726)  Intake/Output from previous day: 09/28 0701 - 09/29 0700 In: -  Out: 750 [Urine:750] Intake/Output this shift: No intake/output data recorded.  Physical Exam: Patient is awake, A/O X 4, and conversant. NAD. VSS. Speech fluent and appropriate. MAEW with good strength. PERLA, EOMI. CNs grossly intact. Dressing is CDI. Incision is well approximated with no drainage, erythema, or edema.  Lab Results: No results for input(s): WBC, HGB, HCT, PLT in the last 72 hours. BMET No results for input(s): NA, K, CL, CO2, GLUCOSE, BUN, CREATININE, CALCIUM in the last 72 hours.  Studies/Results: No results found.  Assessment/Plan: 70 y.o. male who is POD#11 s/p T9-10 laminectomy for SEA. The patients neurological status remains at his baseline. Back pain and BLE pain continue. He is unable to ambulate due to the pain.  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 Constipation continues to persist.     PLAN: - Continue Abx per ID - Continue working with PT/OT in the bed - Continue working on pain control - Encourage mobilization as tolerated - TLSO brace when OOB and PRN, he does not need to wear the brace while in bed - Thoracic stabilization surgery scheduled for Tuesday, 03/31/2021   LOS: 11 days     Council Mechanic, DNP, NP-C 03/26/2021, 9:14 AM

## 2021-03-26 NOTE — Progress Notes (Signed)
RRT asked Pt about wearing CPAP and Pt says they want to put it on themselves. CPAP at bedside.

## 2021-03-26 NOTE — Plan of Care (Signed)

## 2021-03-26 NOTE — Progress Notes (Signed)
    [facility]    Date of Admission:  03/14/2021             ID: Marco Kennedy is a 70 y.o. male with e.coli thoracic epidural abscess and osteomyelitis POD#10 s/p T9-10 laminectomy for SEA. Has back pain, with junctional kyponsis at superior to T10. To undergo thoracic spine mobilization on 10/4.  Active Problems:   Vertebral osteomyelitis (HCC)   Empyema (HCC)   Sepsis due to Escherichia coli (E. coli) (HCC)   Retroperitoneal fluid collection   Constipation   Epidural abscess   Discitis of thoracic region    Subjective: Afebrile. Still having constipation. Feeling of early satiety. Some abdominal bloating  Medications:   acetaminophen  650 mg Oral Q6H   amLODipine  5 mg Oral Daily   bisacodyl  10 mg Rectal Daily   Chlorhexidine Gluconate Cloth  6 each Topical Daily   enoxaparin (LOVENOX) injection  40 mg Subcutaneous Q24H   lubiprostone  24 mcg Oral BID WC   polyethylene glycol  17 g Oral Daily   senna-docusate  2 tablet Oral Daily   sodium chloride flush  5 mL Intracatheter Q8H    Objective: Vital signs in last 24 hours: Temp:  [98.2 F (36.8 C)-98.6 F (37 C)] 98.4 F (36.9 C) (09/29 1207) Pulse Rate:  [99-110] 99 (09/29 1207) Resp:  [18-20] 20 (09/29 1207) BP: (108-135)/(78-90) 108/87 (09/29 1207) SpO2:  [95 %-97 %] 97 % (09/29 1207) Physical Exam  Constitutional: He is oriented to person, place, and time. He appears well-developed and well-nourished. No distress.  HENT:  Mouth/Throat: Oropharynx is clear and moist. No oropharyngeal exudate.  Cardiovascular: Normal rate, regular rhythm and normal heart sounds. Exam reveals no gallop and no friction rub.  No murmur heard.  Pulmonary/Chest: Effort normal and breath sounds normal. No respiratory distress. He has no wheezes.  Abdominal: Soft. Bowel sounds are normal. He exhibits no distension. There is no tenderness.  Lymphadenopathy:  He has no cervical adenopathy.  Neurological: He  is alert and oriented to person, place, and time.  Skin: Skin is warm and dry. No rash noted. No erythema.  Psychiatric: He has a normal mood and affect. His behavior is normal.    Lab Results Lab Results  Component Value Date   ESRSEDRATE 70 (H) 01/20/2021    C-Reactive Protein Recent Labs    03/25/21 1054  CRP 8.2*    Microbiology: Reviewed- e.coli on 01/11/21 Studies/Results: No results found.   Assessment/Plan: Hx of e.coli thoracic epidural abscess and osteomyelitis = plan on continuing cefazolin through surgery, and will decide endpoint pending OR culture results. Please repeat OR culture (aerobic and anaerobic/ fungal cx)  Constipation = currently on miralax, and suppository to help improve his symptoms.  [facility] Pager: [phone]  03/26/2021, 3:55 PM

## 2021-03-27 ENCOUNTER — Ambulatory Visit: Payer: BC Managed Care – PPO | Admitting: Internal Medicine

## 2021-03-27 DIAGNOSIS — G062 Extradural and subdural abscess, unspecified: Secondary | ICD-10-CM | POA: Diagnosis not present

## 2021-03-27 DIAGNOSIS — K5903 Drug induced constipation: Secondary | ICD-10-CM | POA: Diagnosis not present

## 2021-03-27 DIAGNOSIS — M4644 Discitis, unspecified, thoracic region: Secondary | ICD-10-CM | POA: Diagnosis not present

## 2021-03-27 NOTE — Plan of Care (Signed)

## 2021-03-27 NOTE — Progress Notes (Signed)
HD#12 SUBJECTIVE:  Patient Summary: Marco Kennedy is a 70 y.o. with a pertinent PMH of chronic low back pain and hypertension, who presented for MRI follow-up and had worsening lower back pain and admitted for epidural abscess.   Overnight Events: None  Interim History: Patient reports continuing to feel better at this time.  He had 2 large bowel movements yesterday.  He states that he intends to skip his suppository today and continue on less aggressive bowel regimen.  He also intends to increase fiber intake.  OBJECTIVE:  Vital Signs: Vitals:   03/26/21 1931 03/26/21 2348 03/27/21 0425 03/27/21 1111  BP: 108/78 130/80 123/78 (!) 145/99  Pulse: (!) 105 96 87 100  Resp: 18 20 18 16   Temp: 98.4 F (36.9 C) 98.6 F (37 C) 98.5 F (36.9 C) 97.7 F (36.5 C)  TempSrc: Oral Oral Oral Oral  SpO2: 96% 95% 96% 96%  Weight:      Height:       Supplemental O2: Room Air SpO2: 96 % O2 Flow Rate (L/min): 2 L/min  Filed Weights   03/15/21 0014  Weight: 106.1 kg    No intake or output data in the 24 hours ending 03/27/21 1242 Net IO Since Admission: -4,086.85 mL [03/27/21 1242]  Physical Exam: Constitutional: Elderly gentleman sitting inclined in bed.  No acute distress noted. Cardio: Regular rhythm.  No murmurs, rubs, gallops. Pulm: Clear to auscultation bilaterally.  Normal work of breathing on room air. Abdomen: Soft, nontender, nondistended. MSK: Negative for bilateral upper and lower extremity edema. Skin: Skin is warm and dry. Neuro: Alert and oriented x3.  No focal deficit noted. Psych: Normal mood and affect.  Patient Lines/Drains/Airways Status     Active Line/Drains/Airways     Name Placement date Placement time Site Days   Peripheral IV 03/14/21 20 G 1.88" Anterior;Left;Proximal Forearm 03/14/21  1549  Forearm  13   PICC Single Lumen 01/15/21 Right Basilic 39 cm 0 cm 01/15/21  01/17/21  Basilic  71   Closed System Drain 1 Left Flank Bulb (JP) 12 Fr. 01/27/21  1407   Flank  59   Closed System Drain Lateral LLQ Bulb (JP) 10 Fr. 03/16/21  1335  LLQ  11   External Urinary Catheter 03/24/21  1440  --  3   Incision (Closed) 04/18/20 Flank Left 04/18/20  1419  -- 343   Incision (Closed) 01/13/21 Back 01/13/21  1016  -- 73   Incision (Closed) 03/14/21 Back 03/14/21  2006  -- 13   Wound / Incision (Open or Dehisced) 01/29/21 Non-pressure wound;Other (Comment) Buttocks Left 01/29/21  0900  Buttocks  57             ASSESSMENT/PLAN:  Assessment: Active Problems:   Vertebral osteomyelitis (HCC)   Empyema (HCC)   Sepsis due to Escherichia coli (E. coli) (HCC)   Retroperitoneal fluid collection   Constipation   Epidural abscess   Discitis of thoracic region   Plan: #Epidural abscess #Retroperitoneal fluid collection Patient endorses more tolerable pain level with combination of improved constipation and not getting out of bed with physical therapy. -Neurosurgery on board, appreciate their recommendations. -Patient is on bed rest with exception of mobilization to bedside commode.  Continue to work with PT in bed. -TSLO brace when out of bed and as needed -Patient will undergo thoracic stabilization surgery on Tuesday, March 31, 2021 -ID on board, appreciate their recommendations             -Continue  cefazolin 2 g IV every 8 hours -Trend inflammatory markers every 48 hours   #Constipation Patient has had multiple days of successful bowel movements with aggressive bowel regimen.  He he plans to decline suppository today. -Continue bowel regimen of Dulcolax suppository 10 mg rectal daily, lactulose enema 300 mL rectal daily as needed, lubiprostone 24 mcg oral twice daily, MiraLAX daily, senna-docusate 2 tablets oral daily for bowel regimen -Encouraged patient to increase fiber intake in his diet   #Hypertension Chronically managed.  BP of 123/78 today. -Continue amlodipine 5 mg daily  Best Practice: Diet: Regular diet IVF: Fluids: None VTE:  enoxaparin (LOVENOX) injection 40 mg Start: 03/20/21 0951 SCDs Start: 03/14/21 1708 Code: Full AB: Cefazolin 2 g IV 3 times daily DISPO: Continue to monitor inpatient.  Signature: Champ Mungo, D.O.  Internal Medicine Resident, PGY-1 [facility]  Pager: [phone] 12:42 PM, 03/27/2021   Please contact the on call pager after 5 pm and on weekends at [phone].

## 2021-03-27 NOTE — Progress Notes (Signed)
Subjective: Patient reports multiple bowel movements overnight. No acute events reported.   Objective: Vital signs in last 24 hours: Temp:  [98.4 F (36.9 C)-98.6 F (37 C)] 98.5 F (36.9 C) (09/30 0425) Pulse Rate:  [87-105] 87 (09/30 0425) Resp:  [18-20] 18 (09/30 0425) BP: (108-130)/(78-87) 123/78 (09/30 0425) SpO2:  [95 %-97 %] 96 % (09/30 0425)  Intake/Output from previous day: No intake/output data recorded. Intake/Output this shift: No intake/output data recorded.  Physical Exam: Patient is awake, A/O X 4, and conversant. NAD. VSS. Speech fluent and appropriate. MAEW with good strength. PERLA, EOMI. CNs grossly intact. Dressing is CDI. Incision is well approximated with no drainage, erythema, or edema.  Lab Results: No results for input(s): WBC, HGB, HCT, PLT in the last 72 hours. BMET No results for input(s): NA, K, CL, CO2, GLUCOSE, BUN, CREATININE, CALCIUM in the last 72 hours.  Studies/Results: No results found.  Assessment/Plan: 70 y.o. male who is POD#12 s/p T9-10 laminectomy for SEA. The patients neurological status remains at his baseline. Back pain and BLE pain continue unchanged. He is unable to ambulate due to the pain and mobility is severely limited.  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 Constipation continues to persist, although is improving.    PLAN: - Continue Abx per ID - Continue working with PT/OT in the bed - Continue working on pain control - Encourage mobilization as tolerated - TLSO brace when OOB and PRN, he does not need to wear the brace while in bed - Thoracic stabilization surgery scheduled for Tuesday, 03/31/2021      LOS: 12 days     Council Mechanic, DNP, NP-C 03/27/2021, 8:42 AM

## 2021-03-27 NOTE — Progress Notes (Signed)
    [facility]    Date of Admission:  03/14/2021      ID: Marco Kennedy is a 70 y.o. 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 Active Problems:   Vertebral osteomyelitis (HCC)   Empyema (HCC)   Sepsis due to Escherichia coli (E. coli) (HCC)   Retroperitoneal fluid collection   Constipation   Epidural abscess   Discitis of thoracic region   Subjective: Has had some bowel movements to help his constipation. Has not worked with PT yet. No pain while supine but mentioned he is cautious to sitting up  Medications:   acetaminophen  650 mg Oral Q6H   amLODipine  5 mg Oral Daily   bisacodyl  10 mg Rectal Daily   Chlorhexidine Gluconate Cloth  6 each Topical Daily   enoxaparin (LOVENOX) injection  40 mg Subcutaneous Q24H   lubiprostone  24 mcg Oral BID WC   polyethylene glycol  17 g Oral Daily   senna-docusate  2 tablet Oral Daily   sodium chloride flush  5 mL Intracatheter Q8H    Objective: Vital signs in last 24 hours: Temp:  [97.7 F (36.5 C)-98.6 F (37 C)] 97.7 F (36.5 C) (09/30 1111) Pulse Rate:  [87-105] 100 (09/30 1111) Resp:  [16-20] 16 (09/30 1111) BP: (108-145)/(78-99) 145/99 (09/30 1111) SpO2:  [95 %-96 %] 96 % (09/30 1111)  Physical Exam  Constitutional: He is oriented to person, place, and time. He appears well-developed and well-nourished. No distress.  HENT:  Mouth/Throat: Oropharynx is clear and moist. No oropharyngeal exudate.  Cardiovascular: Normal rate, regular rhythm and normal heart sounds. Exam reveals no gallop and no friction rub.  No murmur heard.  Pulmonary/Chest: Effort normal and breath sounds normal. No respiratory distress. He has no wheezes.  Abdominal: Soft. Bowel sounds are normal. He exhibits no distension. There is no tenderness.  Lymphadenopathy:  He has no cervical adenopathy.  Neurological: He is alert and oriented to  person, place, and time.  Skin: Skin is warm and dry. No rash noted. No erythema.  Psychiatric: He has a normal mood and affect. His behavior is normal.    Lab Results  C-Reactive Protein Recent Labs    03/25/21 1054  CRP 8.2*    Microbiology: reviewed Studies/Results: No results found.   Assessment/Plan: Ecoli vertebral osteomyelitis = continue on cefazolin. Recommend to get repeat specimen sent to culture when he goes to OR next week to help guide length of treatment.  Constipation = continue with oral bowel regimen  Will see back on monday  [facility] Pager: [phone]  03/27/2021, 2:55 PM

## 2021-03-28 LAB — CBC
HCT: 32.7 % — ABNORMAL LOW (ref 39.0–52.0)
Hemoglobin: 10.3 g/dL — ABNORMAL LOW (ref 13.0–17.0)
MCH: 27 pg (ref 26.0–34.0)
MCHC: 31.5 g/dL (ref 30.0–36.0)
MCV: 85.8 fL (ref 80.0–100.0)
Platelets: 395 10*3/uL (ref 150–400)
RBC: 3.81 MIL/uL — ABNORMAL LOW (ref 4.22–5.81)
RDW: 14.3 % (ref 11.5–15.5)
WBC: 6.9 10*3/uL (ref 4.0–10.5)
nRBC: 0 % (ref 0.0–0.2)

## 2021-03-28 LAB — BASIC METABOLIC PANEL
Anion gap: 10 (ref 5–15)
BUN: 13 mg/dL (ref 8–23)
CO2: 27 mmol/L (ref 22–32)
Calcium: 8.9 mg/dL (ref 8.9–10.3)
Chloride: 99 mmol/L (ref 98–111)
Creatinine, Ser: 0.66 mg/dL (ref 0.61–1.24)
GFR, Estimated: >60 mL/min (ref >60)
Glucose, Bld: 116 mg/dL — ABNORMAL HIGH (ref 70–99)
Potassium: 3.4 mmol/L — ABNORMAL LOW (ref 3.5–5.1)
Sodium: 136 mmol/L (ref 135–145)

## 2021-03-28 LAB — C-REACTIVE PROTEIN: CRP: 3.6 mg/dL — ABNORMAL HIGH (ref <1.0)

## 2021-03-28 NOTE — Plan of Care (Signed)

## 2021-03-28 NOTE — Progress Notes (Signed)
HD#13 SUBJECTIVE:  Patient Summary: Marco Kennedy is a 70 y.o. with a pertinent PMH of chronic low back pain and hypertension, who presented for MRI follow-up and had worsening lower back pain and admitted for epidural abscess.   Overnight Events: None  Interim History: Patient says that he slept well throughout the night.  He does not endorse any specific complaints.  He is unsure when he last had a bowel movement but states that he does not think he had one overnight.  He has not been able to get out of bed yet secondary to his back pain.  OBJECTIVE:  Vital Signs: Vitals:   03/27/21 2015 03/28/21 0020 03/28/21 0510 03/28/21 0735  BP: (!) 134/93 128/83 (!) 151/85 (!) 151/81  Pulse: 99 98 76 78  Resp: 18 18 18 16   Temp: 98.3 F (36.8 C) 97.7 F (36.5 C) 98.5 F (36.9 C) 98.7 F (37.1 C)  TempSrc: Oral Oral Oral Oral  SpO2: 97% 96% 97% 99%  Weight:      Height:       Supplemental O2: Room Air SpO2: 99 % O2 Flow Rate (L/min): 2 L/min  Filed Weights   03/15/21 0014  Weight: 106.1 kg     Intake/Output Summary (Last 24 hours) at 03/28/2021 1001 Last data filed at 03/28/2021 0912 Gross per 24 hour  Intake --  Output 1075 ml  Net -1075 ml   Net IO Since Admission: -5,161.85 mL [03/28/21 1001]  Physical Exam: Constitutional: Elderly gentleman lying in bed.  No acute distress noted. Cardio: Regular rhythm.  No murmurs, rubs, gallops. Pulm: Clear to auscultation bilaterally.  Normal work of breathing on room air. Abdomen: Soft, nontender, nondistended. MSK: Negative for bilateral upper and lower extremity edema. Skin: Skin is warm and dry. Neuro: Alert and oriented x3.  No focal deficit noted. Psych: Normal mood and affect.  Patient Lines/Drains/Airways Status     Active Line/Drains/Airways     Name Placement date Placement time Site Days   Peripheral IV 03/14/21 20 G 1.88" Anterior;Left;Proximal Forearm 03/14/21  1549  Forearm  13   PICC Single Lumen 01/15/21  Right Basilic 39 cm 0 cm 01/15/21  01/17/21  Basilic  71   Closed System Drain 1 Left Flank Bulb (JP) 12 Fr. 01/27/21  1407  Flank  59   Closed System Drain Lateral LLQ Bulb (JP) 10 Fr. 03/16/21  1335  LLQ  11   External Urinary Catheter 03/24/21  1440  --  3   Incision (Closed) 04/18/20 Flank Left 04/18/20  1419  -- 343   Incision (Closed) 01/13/21 Back 01/13/21  1016  -- 73   Incision (Closed) 03/14/21 Back 03/14/21  2006  -- 13   Wound / Incision (Open or Dehisced) 01/29/21 Non-pressure wound;Other (Comment) Buttocks Left 01/29/21  0900  Buttocks  57             ASSESSMENT/PLAN:  Assessment: Active Problems:   Vertebral osteomyelitis (HCC)   Empyema (HCC)   Sepsis due to Escherichia coli (E. coli) (HCC)   Retroperitoneal fluid collection   Constipation   Epidural abscess   Discitis of thoracic region   Plan: #Epidural abscess #Retroperitoneal fluid collection Patient states that his pain levels have been stable. He has not yet been able to get out of bed. CRP downtrending, 3.6 today from 8.2 three days ago. -Neurosurgery on board, appreciate their recommendations. -Patient is on bed rest with exception of mobilization to bedside commode. Continue to work with PT  in bed. -TSLO brace when out of bed and as needed -Patient will undergo thoracic stabilization surgery on Tuesday, March 31, 2021 -ID on board, appreciate their recommendations             -Continue cefazolin 2 g IV every 8 hours -Trend inflammatory markers every 48 hours   #Constipation Patient is unsure if he has had a BM since yesterday though overall he has had multiple days of successful bowel movements with aggressive bowel regimen.  -Continue bowel regimen of Dulcolax suppository 10 mg rectal daily, lactulose enema 300 mL rectal daily as needed, lubiprostone 24 mcg oral twice daily, MiraLAX daily, senna-docusate 2 tablets oral daily for bowel regimen   #Hypertension Chronically managed. BP of 151/81  today. -Continue amlodipine 5 mg daily  Best Practice: Diet: Regular diet IVF: Fluids: None VTE: enoxaparin (LOVENOX) injection 40 mg Start: 03/20/21 0951 SCDs Start: 03/14/21 1708 Code: Full AB: Cefazolin 2 g IV 3 times daily DISPO: Continue to monitor inpatient.  Signature: Fredonia Highland, MD Internal Medicine Resident, PGY-1 [facility]  Pager: [phone] 10:01 AM, 03/28/2021   Please contact the on call pager after 5 pm and on weekends at [phone].

## 2021-03-28 NOTE — Progress Notes (Signed)
No new issues or problems.  Patient resting reasonably comfortably in bed.  Motor and sensory function are stable.  Abdomen soft.  Patient tolerating diet well.  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  Patient scheduled for revision surgery early this week.  No new recommendations at this time.

## 2021-03-29 LAB — BASIC METABOLIC PANEL
Anion gap: 9 (ref 5–15)
BUN: 13 mg/dL (ref 8–23)
CO2: 28 mmol/L (ref 22–32)
Calcium: 9 mg/dL (ref 8.9–10.3)
Chloride: 99 mmol/L (ref 98–111)
Creatinine, Ser: 0.61 mg/dL (ref 0.61–1.24)
GFR, Estimated: >60 mL/min (ref >60)
Glucose, Bld: 123 mg/dL — ABNORMAL HIGH (ref 70–99)
Potassium: 3.5 mmol/L (ref 3.5–5.1)
Sodium: 136 mmol/L (ref 135–145)

## 2021-03-29 LAB — CBC
HCT: 34.2 % — ABNORMAL LOW (ref 39.0–52.0)
Hemoglobin: 11 g/dL — ABNORMAL LOW (ref 13.0–17.0)
MCH: 27.4 pg (ref 26.0–34.0)
MCHC: 32.2 g/dL (ref 30.0–36.0)
MCV: 85.3 fL (ref 80.0–100.0)
Platelets: 425 10*3/uL — ABNORMAL HIGH (ref 150–400)
RBC: 4.01 MIL/uL — ABNORMAL LOW (ref 4.22–5.81)
RDW: 14.3 % (ref 11.5–15.5)
WBC: 7.5 10*3/uL (ref 4.0–10.5)
nRBC: 0 % (ref 0.0–0.2)

## 2021-03-29 MED ORDER — SENNOSIDES-DOCUSATE SODIUM 8.6-50 MG PO TABS
2.0000 | ORAL_TABLET | Freq: Two times a day (BID) | ORAL | Status: DC
  Administered 2021-03-29 – 2021-04-03 (×8): 2 via ORAL
  Filled 2021-03-29 (×9): qty 2

## 2021-03-29 NOTE — Progress Notes (Signed)
HD#14 SUBJECTIVE:  Patient Summary:  Overnight, there were no acute events.  The patient was seen at bedside during rounds this morning.  He states that his LE numbness in his legs feels worse compared to yesterday.  He also states that he has not had a bowel movement yet today nor did he have one yesterday.  He states that he feels bloated, has some lower abdominal pain now.  He states that he has not needed oxy in the past couple days and that his pain seems to be somewhat better. There are no other complaints or concerns at this time.   OBJECTIVE:  Vital Signs: Vitals:   03/28/21 2028 03/28/21 2326 03/28/21 2328 03/29/21 0353  BP: (!) 130/93 125/82  (!) 142/92  Pulse: (!) 109 96  96  Resp: 18 18  18   Temp: 98 F (36.7 C) 98.1 F (36.7 C)  98 F (36.7 C)  TempSrc: Oral Oral    SpO2: 98% (!) 78% 98% 98%  Weight:      Height:       Supplemental O2: Room Air SpO2: 98 % O2 Flow Rate (L/min): 2 L/min  Filed Weights   03/15/21 0014  Weight: 106.1 kg    Intake/Output Summary (Last 24 hours) at 03/29/2021 0618 Last data filed at 03/28/2021 0912 Gross per 24 hour  Intake --  Output 275 ml  Net -275 ml    Net IO Since Admission: -5,161.85 mL [03/29/21 0618]   Physical Exam: Constitutional: Elderly gentleman lying in bed.  No acute distress noted. Cardio: Regular rhythm.  No murmurs, rubs, gallops. Pulm: Clear to auscultation bilaterally. Normal work of breathing on room air. Abdomen: Soft, nontender, nondistended. MSK: Negative for bilateral upper and lower extremity edema. Skin: Skin is warm and dry. Neurologic exam: Mental status: A&Ox3. Sensory: Sensation to light touch is intact T4-L1 bilaterally. Decreased sensation to light touch L2-L5 bilaterally. Psychiatric: Normal mood and affect   Patient Lines/Drains/Airways Status     Active Line/Drains/Airways     Name Placement date Placement time Site Days   Peripheral IV 03/14/21 20 G 1.88" Anterior;Left;Proximal  Forearm 03/14/21  1549  Forearm  13   PICC Single Lumen 01/15/21 Right Basilic 39 cm 0 cm 01/15/21  01/17/21  Basilic  71   Closed System Drain 1 Left Flank Bulb (JP) 12 Fr. 01/27/21  1407  Flank  59   Closed System Drain Lateral LLQ Bulb (JP) 10 Fr. 03/16/21  1335  LLQ  11   External Urinary Catheter 03/24/21  1440  --  3   Incision (Closed) 04/18/20 Flank Left 04/18/20  1419  -- 343   Incision (Closed) 01/13/21 Back 01/13/21  1016  -- 73   Incision (Closed) 03/14/21 Back 03/14/21  2006  -- 13   Wound / Incision (Open or Dehisced) 01/29/21 Non-pressure wound;Other (Comment) Buttocks Left 01/29/21  0900  Buttocks  57             ASSESSMENT/PLAN:  Assessment: Active Problems:   Vertebral osteomyelitis (HCC)   Empyema (HCC)   Sepsis due to Escherichia coli (E. coli) (HCC)   Retroperitoneal fluid collection   Constipation   Epidural abscess   Discitis of thoracic region  Plan: #Epidural abscess #Retroperitoneal fluid collection Patient states that his pain levels have been stable and that he has not needed any oxy for the past couple of days. Today, he has decreased sensation to light touch bilaterally at L2-L5. Neurosurgery is on board, plan for thoracic  stabilization surgery on 10/04. -Neurosurgery on board, appreciate their recommendations. -Patient is on bed rest with exception of mobilization to bedside commode. Continue to work with PT in bed. -TSLO brace when out of bed and as needed -ID on board, appreciate their recommendations             -Continue cefazolin 2 g IV every 8 hours -Trend inflammatory markers every 48 hours   #Constipation Patient has not had a bowel movement in the past 24 to 48 hours. He states that his stomach feels more full and has had more abdominal pain in the past day or so.  Review of records shows that he declined his suppositories but otherwise has been adherent to bowel regimen as below.  Emphasized the need to stay well-hydrated throughout the  day. -Continue bowel regimen of Dulcolax suppository 10 mg rectal daily, lactulose enema 300 mL rectal daily as needed, lubiprostone 24 mcg oral twice daily, MiraLAX daily, senna-docusate 2 tablets oral daily for bowel regimen   #Hypertension Chronically managed. BP of 140/82 today. -Continue amlodipine 5 mg daily  Best Practice: Diet: Regular diet IVF: Fluids: None VTE: enoxaparin (LOVENOX) injection 40 mg Start: 03/20/21 0951 SCDs Start: 03/14/21 1708 Code: Full AB: Cefazolin 2 g IV 3 times daily DISPO: Continue to monitor inpatient.  Signature: Fredonia Highland, MD Internal Medicine Resident, PGY-1 [facility]  Pager: [phone] 6:18 AM, 03/29/2021   Please contact the on call pager after 5 pm and on weekends at [phone].

## 2021-03-29 NOTE — Plan of Care (Signed)
  Problem: Education: Goal: Ability to verbalize activity precautions or restrictions will improve Outcome: Progressing Goal: Knowledge of the prescribed therapeutic regimen will improve Outcome: Progressing   Problem: Activity: Goal: Ability to avoid complications of mobility impairment will improve Outcome: Progressing Goal: Ability to tolerate increased activity will improve Outcome: Progressing   Problem: Bowel/Gastric: Goal: Gastrointestinal status for postoperative course will improve Outcome: Progressing   Problem: Clinical Measurements: Goal: Ability to maintain clinical measurements within normal limits will improve Outcome: Progressing   Problem: Pain Management: Goal: Pain level will decrease Outcome: Progressing   Problem: Skin Integrity: Goal: Will show signs of wound healing Outcome: Progressing   Problem: Bladder/Genitourinary: Goal: Urinary functional status for postoperative course will improve Outcome: Progressing

## 2021-03-29 NOTE — Progress Notes (Signed)
No new issues or problems overnight.  Pain stable.  No new complaints of sensory loss or weakness.  He is afebrile.  Vital signs are stable.  He is awake and alert.  Motor and sensory function grossly intact.  Abdomen soft.  Overall stable.  Continue IV antibiotics.  Plan for revision thoracic decompression and fusion surgery later this week.

## 2021-03-29 NOTE — Plan of Care (Signed)
  Problem: Education: Goal: Ability to verbalize activity precautions or restrictions will improve Outcome: Progressing Goal: Knowledge of the prescribed therapeutic regimen will improve Outcome: Progressing Goal: Understanding of discharge needs will improve Outcome: Progressing   Problem: Activity: Goal: Ability to avoid complications of mobility impairment will improve Outcome: Progressing Goal: Ability to tolerate increased activity will improve Outcome: Progressing   Problem: Bowel/Gastric: Goal: Gastrointestinal status for postoperative course will improve Outcome: Progressing   Problem: Clinical Measurements: Goal: Ability to maintain clinical measurements within normal limits will improve Outcome: Progressing Goal: Postoperative complications will be avoided or minimized Outcome: Progressing   Problem: Pain Management: Goal: Pain level will decrease Outcome: Progressing   Problem: Skin Integrity: Goal: Will show signs of wound healing Outcome: Progressing   Problem: Health Behavior/Discharge Planning: Goal: Identification of resources available to assist in meeting health care needs will improve Outcome: Progressing   Problem: Bladder/Genitourinary: Goal: Urinary functional status for postoperative course will improve Outcome: Progressing

## 2021-03-30 DIAGNOSIS — G062 Extradural and subdural abscess, unspecified: Secondary | ICD-10-CM | POA: Diagnosis not present

## 2021-03-30 DIAGNOSIS — M4620 Osteomyelitis of vertebra, site unspecified: Secondary | ICD-10-CM | POA: Diagnosis not present

## 2021-03-30 DIAGNOSIS — M4644 Discitis, unspecified, thoracic region: Secondary | ICD-10-CM | POA: Diagnosis not present

## 2021-03-30 LAB — SURGICAL PCR SCREEN
MRSA, PCR: NEGATIVE
Staphylococcus aureus: NEGATIVE

## 2021-03-30 LAB — BASIC METABOLIC PANEL
Anion gap: 10 (ref 5–15)
BUN: 11 mg/dL (ref 8–23)
CO2: 27 mmol/L (ref 22–32)
Calcium: 8.9 mg/dL (ref 8.9–10.3)
Chloride: 98 mmol/L (ref 98–111)
Creatinine, Ser: 0.58 mg/dL — ABNORMAL LOW (ref 0.61–1.24)
GFR, Estimated: >60 mL/min (ref >60)
Glucose, Bld: 112 mg/dL — ABNORMAL HIGH (ref 70–99)
Potassium: 3.4 mmol/L — ABNORMAL LOW (ref 3.5–5.1)
Sodium: 135 mmol/L (ref 135–145)

## 2021-03-30 LAB — CBC
HCT: 34.9 % — ABNORMAL LOW (ref 39.0–52.0)
Hemoglobin: 10.8 g/dL — ABNORMAL LOW (ref 13.0–17.0)
MCH: 26.8 pg (ref 26.0–34.0)
MCHC: 30.9 g/dL (ref 30.0–36.0)
MCV: 86.6 fL (ref 80.0–100.0)
Platelets: 458 10*3/uL — ABNORMAL HIGH (ref 150–400)
RBC: 4.03 MIL/uL — ABNORMAL LOW (ref 4.22–5.81)
RDW: 14.4 % (ref 11.5–15.5)
WBC: 7.9 10*3/uL (ref 4.0–10.5)
nRBC: 0 % (ref 0.0–0.2)

## 2021-03-30 LAB — TYPE AND SCREEN
ABO/RH(D): A POS
Antibody Screen: NEGATIVE

## 2021-03-30 LAB — C-REACTIVE PROTEIN: CRP: 1.1 mg/dL — ABNORMAL HIGH (ref <1.0)

## 2021-03-30 MED ORDER — POTASSIUM CHLORIDE 20 MEQ PO PACK
40.0000 meq | PACK | Freq: Two times a day (BID) | ORAL | Status: DC
  Administered 2021-03-30 – 2021-03-31 (×3): 40 meq via ORAL
  Filled 2021-03-30 (×3): qty 2

## 2021-03-30 MED ORDER — CHLORHEXIDINE GLUCONATE CLOTH 2 % EX PADS
6.0000 | MEDICATED_PAD | Freq: Once | CUTANEOUS | Status: AC
  Administered 2021-03-30: 6 via TOPICAL

## 2021-03-30 NOTE — Plan of Care (Signed)
  Problem: Education: Goal: Ability to verbalize activity precautions or restrictions will improve Outcome: Progressing Goal: Knowledge of the prescribed therapeutic regimen will improve Outcome: Progressing   Problem: Activity: Goal: Ability to avoid complications of mobility impairment will improve Outcome: Progressing Goal: Ability to tolerate increased activity will improve Outcome: Progressing   Problem: Bowel/Gastric: Goal: Gastrointestinal status for postoperative course will improve Outcome: Progressing   Problem: Clinical Measurements: Goal: Ability to maintain clinical measurements within normal limits will improve Outcome: Progressing   Problem: Pain Management: Goal: Pain level will decrease Outcome: Progressing   Problem: Health Behavior/Discharge Planning: Goal: Identification of resources available to assist in meeting health care needs will improve Outcome: Progressing   Problem: Bladder/Genitourinary: Goal: Urinary functional status for postoperative course will improve Outcome: Progressing

## 2021-03-30 NOTE — Progress Notes (Signed)
HD#15 SUBJECTIVE:  Patient Summary:  No acute overnight events. Patient was seen at bedside during rounds this morning. Pt reports feeling well this AM with no complaints. Pt reports improvement in back pain.  He states that he was able to work with physical therapy yesterday and has been progressing but still has a long way to go.  He also states that he had a couple bowel movements yesterday after we escalated his bowel regimen.  He expresses understanding of the plan moving forward.    OBJECTIVE:  Vital Signs: Vitals:   03/29/21 1601 03/29/21 1959 03/29/21 2340 03/30/21 0329  BP: 129/82 120/79 138/82 (!) 135/95  Pulse: 89 97 95 92  Resp: 18 17 17 17   Temp: 98.4 F (36.9 C) 97.8 F (36.6 C) 97.6 F (36.4 C) 98 F (36.7 C)  TempSrc: Oral  Oral Oral  SpO2: 99% 96% 96% 99%  Weight:      Height:       Supplemental O2: Room Air SpO2: 99 % O2 Flow Rate (L/min): 2 L/min  Filed Weights   03/15/21 0014  Weight: 106.1 kg    Intake/Output Summary (Last 24 hours) at 03/30/2021 0754 Last data filed at 03/30/2021 0329 Gross per 24 hour  Intake 200 ml  Output 900 ml  Net -700 ml    Net IO Since Admission: -5,861.85 mL [03/30/21 0754]   Physical Exam: Constitutional: Elderly gentleman lying in bed.  No acute distress noted. Cardio: Regular rhythm.  No murmurs, rubs, gallops. Pulm: Clear to auscultation bilaterally. Normal work of breathing on room air. Abdomen: Soft, nontender, nondistended. MSK: Negative for bilateral upper and lower extremity edema. Skin: Skin is warm and dry. Neurologic exam: Mental status: A&Ox3. Sensory: Decreased sensation to light touch in bilateral lower extremities, similar to exam yesterday. Psychiatric: Normal mood and affect   Patient Lines/Drains/Airways Status     Active Line/Drains/Airways     Name Placement date Placement time Site Days   Peripheral IV 03/14/21 20 G 1.88" Anterior;Left;Proximal Forearm 03/14/21  1549  Forearm  13   PICC  Single Lumen 01/15/21 Right Basilic 39 cm 0 cm 01/15/21  01/17/21  Basilic  71   Closed System Drain 1 Left Flank Bulb (JP) 12 Fr. 01/27/21  1407  Flank  59   Closed System Drain Lateral LLQ Bulb (JP) 10 Fr. 03/16/21  1335  LLQ  11   External Urinary Catheter 03/24/21  1440  --  3   Incision (Closed) 04/18/20 Flank Left 04/18/20  1419  -- 343   Incision (Closed) 01/13/21 Back 01/13/21  1016  -- 73   Incision (Closed) 03/14/21 Back 03/14/21  2006  -- 13   Wound / Incision (Open or Dehisced) 01/29/21 Non-pressure wound;Other (Comment) Buttocks Left 01/29/21  0900  Buttocks  57             ASSESSMENT/PLAN:  Assessment: Active Problems:   Vertebral osteomyelitis (HCC)   Empyema (HCC)   Sepsis due to Escherichia coli (E. coli) (HCC)   Retroperitoneal fluid collection   Constipation   Epidural abscess   Discitis of thoracic region  Plan: #Epidural abscess #Retroperitoneal fluid collection Patient states that his pain levels have been stable to improving.  Today, he has decreased sensation to light touch in the bilateral lower extremities, similar to his exam yesterday. Neurosurgery is on board, plan for thoracic stabilization surgery tomorrow, 10/04. -Neurosurgery on board, appreciate their recommendations.            -N.p.o.  at midnight -Patient is on bed rest with exception of mobilization to bedside commode. Continue to work with PT in bed. -TSLO brace when out of bed and as needed -ID on board, appreciate their recommendations             -Continue cefazolin 2 g IV every 8 hours -Trend inflammatory markers every 48 hours  #Constipation Patient is on the bowel regimen as below.  He had 2 suppositories yesterday and endorses having 2 bowel movements since then. -Continue bowel regimen of Dulcolax suppository 10 mg rectal daily, lactulose enema 300 mL rectal daily as needed, lubiprostone 24 mcg oral twice daily, MiraLAX daily, senna-docusate 2 tablets oral BID for bowel regimen    #Hypertension Chronically managed. BP of 142/86 today. -Continue amlodipine 5 mg daily  Best Practice: Diet: Regular diet IVF: Fluids: None VTE: enoxaparin (LOVENOX) injection 40 mg Start: 03/20/21 0951 SCDs Start: 03/14/21 1708 Code: Full AB: Cefazolin 2 g IV 3 times daily DISPO: Continue to monitor inpatient.  Signature: Fredonia Highland, MD Internal Medicine Resident, PGY-1 [facility]  Pager: [phone] 7:54 AM, 03/30/2021   Please contact the on call pager after 5 pm and on weekends at [phone].

## 2021-03-30 NOTE — Progress Notes (Signed)
    [facility]    Date of Admission:  03/14/2021   Total days of antibiotics roughly 11 wk   ID: Marco Kennedy is a 70 y.o. male with  ecoli vertebral osteomyelitis Active Problems:   Vertebral osteomyelitis (HCC)   Empyema (HCC)   Sepsis due to Escherichia coli (E. coli) (HCC)   Retroperitoneal fluid collection   Constipation   Epidural abscess   Discitis of thoracic region    Subjective: Afebrile. Not constipated as long as taking bowel prep. Still some decrease sensation to legs, unchanged. No fevers.  Medications:   acetaminophen  650 mg Oral Q6H   amLODipine  5 mg Oral Daily   bisacodyl  10 mg Rectal Daily   Chlorhexidine Gluconate Cloth  6 each Topical Daily   Chlorhexidine Gluconate Cloth  6 each Topical Once   enoxaparin (LOVENOX) injection  40 mg Subcutaneous Q24H   lubiprostone  24 mcg Oral BID WC   polyethylene glycol  17 g Oral Daily   potassium chloride  40 mEq Oral BID   senna-docusate  2 tablet Oral BID   sodium chloride flush  5 mL Intracatheter Q8H    Objective: Vital signs in last 24 hours: Temp:  [97.6 F (36.4 C)-98.4 F (36.9 C)] 98 F (36.7 C) (10/03 1233) Pulse Rate:  [89-97] 93 (10/03 1233) Resp:  [17-18] 17 (10/03 1233) BP: (120-145)/(79-95) 142/86 (10/03 1233) SpO2:  [96 %-99 %] 97 % (10/03 1233)  Physical Exam  Constitutional: He is oriented to person, place, and time. He appears well-developed and well-nourished. No distress.  HENT:  Mouth/Throat: Oropharynx is clear and moist. No oropharyngeal exudate.  Cardiovascular: Normal rate, regular rhythm and normal heart sounds. Exam reveals no gallop and no friction rub.  No murmur heard.  Pulmonary/Chest: Effort normal and breath sounds normal. No respiratory distress. He has no wheezes.  Abdominal: Soft. Bowel sounds are normal. He exhibits no distension. There is no tenderness.  Lymphadenopathy:  He has no cervical adenopathy.  Neurological: He is alert and  oriented to person, place, and time. 4/5 strength of legs, limited by pain, mild decrease sensation to LE bilaterally Skin: Skin is warm and dry. No rash noted. No erythema.  Psychiatric: He has a normal mood and affect. His behavior is normal.    Lab Results Recent Labs    03/29/21 0542 03/30/21 0500  WBC 7.5 7.9  HGB 11.0* 10.8*  HCT 34.2* 34.9*  NA 136 135  K 3.5 3.4*  CL 99 98  CO2 28 27  BUN 13 11  CREATININE 0.61 0.58*   Liver Panel No results for input(s): PROT, ALBUMIN, AST, ALT, ALKPHOS, BILITOT, BILIDIR, IBILI in the last 72 hours. Sedimentation Rate No results for input(s): ESRSEDRATE in the last 72 hours. C-Reactive Protein Recent Labs    03/28/21 0620 03/30/21 0500  CRP 3.6* 1.1*    Microbiology: reviewed Studies/Results: No results found.   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 Planning for revision in OR tomorrow.  - continue with cefazolin. Will ask that new OR specimen be sent tomorrow. If cx is negative (which I suspect given duration of IV therapy he has received), recommend to switch to oral regimen for suppression after his surgery.  Constipation = continue on oral bowel regimen.  Leukocytosis = resolved.  [facility] Pager: [phone]  03/30/2021, 2:38 PM

## 2021-03-30 NOTE — Progress Notes (Signed)
Subjective: Patient reports an uneventful weekend. He continues to have back pain and feeling of bloating. No acute events overnight.   Objective: Vital signs in last 24 hours: Temp:  [97.6 F (36.4 C)-98.4 F (36.9 C)] 98 F (36.7 C) (10/03 0329) Pulse Rate:  [83-97] 92 (10/03 0329) Resp:  [17-18] 17 (10/03 0329) BP: (120-140)/(79-95) 135/95 (10/03 0329) SpO2:  [96 %-99 %] 99 % (10/03 0329)  Intake/Output from previous day: 10/02 0701 - 10/03 0700 In: 200 [P.O.:200] Out: 900 [Urine:900] Intake/Output this shift: No intake/output data recorded.  Physical Exam: Patient is awake, A/O X 4, and conversant. NAD. VSS. Speech fluent and appropriate. MAEW with good strength. PERLA, EOMI. CNs grossly intact. Dressing is CDI. Incision is well approximated with no drainage, erythema, or edema.  Lab Results: Recent Labs    03/29/21 0542 03/30/21 0500  WBC 7.5 7.9  HGB 11.0* 10.8*  HCT 34.2* 34.9*  PLT 425* 458*   BMET Recent Labs    03/29/21 0542 03/30/21 0500  NA 136 135  K 3.5 3.4*  CL 99 98  CO2 28 27  GLUCOSE 123* 112*  BUN 13 11  CREATININE 0.61 0.58*  CALCIUM 9.0 8.9    Studies/Results: No results found.  Assessment/Plan: 70 y.o. 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 The patient's neurological status remains at his baseline. Back pain and BLE pain continue unchanged. He is unable to ambulate due to the pain and mobility is severely limited.  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 Constipation continues to persist, although is improving. I discussed the surgical plan with the patient and his wife. Risks and benefits were discussed and all of their questions were answered.      PLAN: - Continue Abx per ID - Continue working with PT/OT in the bed - Continue working on pain control - Encourage mobilization as  tolerated  - TLSO brace when OOB and PRN, he does not need to wear the brace while in bed - Thoracic stabilization surgery scheduled for Tuesday, 03/31/2021  LOS: 15 days     Council Mechanic, DNP, NP-C 03/30/2021, 8:05 AM

## 2021-03-30 NOTE — Progress Notes (Signed)
Physical Therapy Treatment Patient Details Name: Marco Kennedy MRN: [id number] DOB: 1950-12-12 Today's Date: 03/30/2021   History of Present Illness Pt is a 70 y.o. 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 He is now s/p T9-T10 laminectomy for decompression of thecal sac and debridement of abscess 9/18. Drain placed in left retroperitoneal space on 03/17/19 on bulb suction. Pt had back sx  01/2020 with Dr. Venetia Maxon; admission 7/16-22 for sepsis and again 7/26 for SOB and fever. PMHx: back sx, arthritis, degenerative lumbar spinal stenosis, THA 2017.    PT Comments    Pt seen with OT today to focus on dynamic sitting balance at EOB. Pt continues to require mod support at trunk during dynamic tasks both with OT during ADLs and with PT doing LE exercises. Statically pt can balance with bilat UE support. Aware pt is planned for another surgery tomorrow. PT to return as appropriate/ as able to re-assess pt  mobility s/p surgery.     Recommendations for follow up therapy are one component of a multi-disciplinary discharge planning process, led by the attending physician.  Recommendations may be updated based on patient status, additional functional criteria and insurance authorization.  Follow Up Recommendations  CIR;Supervision/Assistance - 24 hour     Equipment Recommendations  Other (comment) (determine in next venue)    Recommendations for Other Services Rehab consult     Precautions / Restrictions Precautions Precautions: Fall;Back Precaution Booklet Issued: No Precaution Comments: pt re-educated on back precautions, pt with poor carry over despite being told them daily for 2 weeks Required Braces or Orthoses: Spinal Brace Spinal Brace: Thoracolumbosacral orthotic;Other (comment) Spinal Brace Comments: TLSO when OOB only Restrictions Weight Bearing Restrictions: No     Mobility  Bed Mobility Overal bed mobility: Needs  Assistance Bed Mobility: Rolling;Sidelying to Sit;Sit to Sidelying Rolling: Mod assist;+2 for physical assistance;+2 for safety/equipment Sidelying to sit: Max assist;+2 for physical assistance     Sit to sidelying: Max assist;+2 for physical assistance;+2 for safety/equipment General bed mobility comments: max A +2 for physical assist and verbal cues, assis required for LEs and trunk    Transfers                 General transfer comment: deferred due to significant LE weakness, back pain and planned sx for tomorrow  Ambulation/Gait             General Gait Details: unable   Stairs             Wheelchair Mobility    Modified Rankin (Stroke Patients Only)       Balance Overall balance assessment: Needs assistance Sitting-balance support: Feet supported Sitting balance-Leahy Scale: Fair Sitting balance - Comments: pt able to sustain unsupported sitting balance with BLE supported on the floor and verbal cues for hand placement. required back and trunk support during lower body exercsies and intermittently during ADLs with OT Postural control: Posterior lean   Standing balance-Leahy Scale: Zero Standing balance comment: pt unable to reach upright posture with +2 assist on STS attempt; BLE buckling                            Cognition Arousal/Alertness: Awake/alert Behavior During Therapy: WFL for tasks assessed/performed;Anxious Overall Cognitive Status: Within Functional Limits for tasks assessed  General Comments: continues to have poor recall of back precautions and requires frequent cueing      Exercises General Exercises - Lower Extremity Gluteal Sets:  (neutral spine in bed) Long Arc Quad: AAROM;Both;5 reps;Seated (both holding for 3 sec in extension and actively raising LE into extension) Heel Slides: AROM;5 reps;Seated (against minimal resistance) Hip Flexion/Marching: AAROM;Both;5  reps;Seated Toe Raises: Both;5 reps;Seated;Other (comment);AROM Heel Raises: Both;5 reps;Seated;AROM    General Comments General comments (skin integrity, edema, etc.): VSS      Pertinent Vitals/Pain Pain Assessment: 0-10 Pain Score: 5  Faces Pain Scale: Hurts whole lot Pain Location: lower back, abdomen Pain Descriptors / Indicators: Grimacing;Guarding Pain Intervention(s): Monitored during session    Home Living                      Prior Function            PT Goals (current goals can now be found in the care plan section) Acute Rehab PT Goals Patient Stated Goal: less pain PT Goal Formulation: With patient Time For Goal Achievement: 04/13/21 Potential to Achieve Goals: Fair Progress towards PT goals: Progressing toward goals    Frequency    Min 3X/week      PT Plan Frequency needs to be updated;Current plan remains appropriate    Co-evaluation PT/OT/SLP Co-Evaluation/Treatment: Yes Reason for Co-Treatment: To address functional/ADL transfers PT goals addressed during session: Mobility/safety with mobility OT goals addressed during session: ADL's and self-care;Strengthening/ROM      AM-PAC PT "6 Clicks" Mobility   Outcome Measure  Help needed turning from your back to your side while in a flat bed without using bedrails?: A Lot Help needed moving from lying on your back to sitting on the side of a flat bed without using bedrails?: A Lot Help needed moving to and from a bed to a chair (including a wheelchair)?: Total Help needed standing up from a chair using your arms (e.g., wheelchair or bedside chair)?: Total Help needed to walk in hospital room?: Total Help needed climbing 3-5 steps with a railing? : Total 6 Click Score: 8    End of Session Equipment Utilized During Treatment: Other (comment) (TLSO) Activity Tolerance: Patient limited by fatigue;Patient limited by pain Patient left: in bed;with call bell/phone within reach;with bed alarm  set Nurse Communication: Mobility status;Precautions PT Visit Diagnosis: Muscle weakness (generalized) (M62.81);Difficulty in walking, not elsewhere classified (R26.2) Pain - part of body:  (back, abdomen)     Time: 9741-6384 PT Time Calculation (min) (ACUTE ONLY): 34 min  Charges:  $Therapeutic Exercise: 8-22 mins                     Lewis Shock, PT, DPT Acute Rehabilitation Services Pager #: [phone] Office #: [phone]    Iona Hansen 03/30/2021, 1:46 PM

## 2021-03-30 NOTE — Progress Notes (Signed)
Occupational Therapy Treatment Patient Details Name: Marco Kennedy MRN: [id number] DOB: 08/01/1950 Today's Date: 03/30/2021   History of present illness Pt is a 70 y.o. 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 He is now s/p T9-T10 laminectomy for decompression of thecal sac and debridement of abscess 9/18. Drain placed in left retroperitoneal space on 03/17/19 on bulb suction. Pt had back sx  01/2020 with Dr. Venetia Maxon; admission 7/16-22 for sepsis and again 7/26 for SOB and fever. PMHx: back sx, arthritis, degenerative lumbar spinal stenosis, THA 2017.   OT comments  Pt progressing with plans for back sx tomorrow, seen in conjunction with PT. Pt demonstrated better tolerance for bed mobility and completing EOB tasks this session. He continues to require mod-max A +2 for all mobility tasks with frequent cueing for back precautions. Pt tolerated sitting EOB for grooming and BLE exercises for ~15 minutes. He continues to benefit from OT acutely, plan to re-evaluated pt after back surgery.    Recommendations for follow up therapy are one component of a multi-disciplinary discharge planning process, led by the attending physician.  Recommendations may be updated based on patient status, additional functional criteria and insurance authorization.    Follow Up Recommendations  CIR    Equipment Recommendations  None recommended by OT       Precautions / Restrictions Precautions Precautions: Fall;Back Precaution Booklet Issued: No Precaution Comments: drain removed Required Braces or Orthoses: Spinal Brace Spinal Brace: Thoracolumbosacral orthotic;Other (comment) Spinal Brace Comments: TLSO when OOB only Restrictions Weight Bearing Restrictions: No       Mobility Bed Mobility Overal bed mobility: Needs Assistance Bed Mobility: Rolling;Sidelying to Sit;Sit to Sidelying Rolling: Mod assist;+2 for physical assistance;+2 for  safety/equipment Sidelying to sit: +2 for physical assistance;+2 for safety/equipment     Sit to sidelying: Max assist;+2 for physical assistance;+2 for safety/equipment General bed mobility comments: max A +2 for physical assist and verbal cues    Transfers                      Balance Overall balance assessment: Needs assistance Sitting-balance support: Feet supported Sitting balance-Leahy Scale: Fair Sitting balance - Comments: pt able to sustain unsupported sitting balance with BLE supported on the floor and verbal cues for hand placement. required back anc trunk support during lower body exercsies and intermittently during bimanual grooming tasks Postural control: Posterior lean                                 ADL either performed or assessed with clinical judgement   ADL       Grooming: Set up;Sitting Grooming Details (indicate cue type and reason): pt able to complete bimanual groming tasks while sitting EOB this session                             Functional mobility during ADLs: Maximal assistance;+2 for physical assistance;+2 for safety/equipment (bed mobility & EOB only) General ADL Comments: Pt with imporved tolerance of completeing EOB grooming session     Vision   Vision Assessment?: No apparent visual deficits          Cognition Arousal/Alertness: Awake/alert Behavior During Therapy: WFL for tasks assessed/performed;Anxious Overall Cognitive Status: Within Functional Limits for tasks assessed  General Comments: continues to require frequent cueing for back precautions              General Comments no new concerns    Pertinent Vitals/ Pain       Pain Assessment: 0-10 Pain Score: 5  Pain Location: lower back, abdomen Pain Descriptors / Indicators: Grimacing;Guarding Pain Intervention(s): Limited activity within patient's tolerance;Monitored during session;Repositioned   Frequency  Min 2X/week         Progress Toward Goals  OT Goals(current goals can now be found in the care plan section)  Progress towards OT goals: Progressing toward goals  Acute Rehab OT Goals Patient Stated Goal: less pain OT Goal Formulation: With patient Time For Goal Achievement: 03/30/21 Potential to Achieve Goals: Fair ADL Goals Pt Will Perform Grooming: with supervision;standing Pt Will Perform Lower Body Bathing: with min assist;sit to/from stand Pt Will Perform Lower Body Dressing: with min assist;with adaptive equipment;sit to/from stand Pt Will Transfer to Toilet: with supervision;ambulating Pt Will Perform Toileting - Clothing Manipulation and hygiene: with modified independence;sitting/lateral leans  Plan Discharge plan remains appropriate;Frequency remains appropriate    Co-evaluation    PT/OT/SLP Co-Evaluation/Treatment: Yes Reason for Co-Treatment: To address functional/ADL transfers;Complexity of the patient's impairments (multi-system involvement)   OT goals addressed during session: ADL's and self-care;Strengthening/ROM      AM-PAC OT "6 Clicks" Daily Activity     Outcome Measure   Help from another person eating meals?: Total Help from another person taking care of personal grooming?: A Little Help from another person toileting, which includes using toliet, bedpan, or urinal?: A Lot Help from another person bathing (including washing, rinsing, drying)?: A Lot Help from another person to put on and taking off regular upper body clothing?: A Little Help from another person to put on and taking off regular lower body clothing?: A Lot 6 Click Score: 13    End of Session Equipment Utilized During Treatment: Back brace  OT Visit Diagnosis: Unsteadiness on feet (R26.81);Other abnormalities of gait and mobility (R26.89);Muscle weakness (generalized) (M62.81);Repeated falls (R29.6);Pain   Activity Tolerance Patient limited by pain;Patient tolerated treatment well   Patient  Left in bed;with call bell/phone within reach;with bed alarm set;with family/visitor present   Nurse Communication Mobility status        Time: 1005-1040 OT Time Calculation (min): 35 min  Charges: OT General Charges $OT Visit: 1 Visit OT Treatments $Self Care/Home Management : 8-22 mins   Dynesha Woolen A Briannon Boggio 03/30/2021, 11:32 AM

## 2021-03-30 NOTE — Plan of Care (Signed)
  Problem: Education: Goal: Knowledge of General Education information will improve Description: Including pain rating scale, medication(s)/side effects and non-pharmacologic comfort measures Outcome: Progressing   Problem: Clinical Measurements: Goal: Ability to maintain clinical measurements within normal limits will improve Outcome: Progressing Goal: Will remain free from infection Outcome: Progressing Goal: Diagnostic test results will improve Outcome: Progressing Goal: Respiratory complications will improve Outcome: Progressing Goal: Cardiovascular complication will be avoided Outcome: Progressing   Problem: Safety: Goal: Ability to remain free from injury will improve Outcome: Progressing   Problem: Clinical Measurements: Goal: Ability to maintain clinical measurements within normal limits will improve Outcome: Progressing Goal: Postoperative complications will be avoided or minimized Outcome: Progressing Goal: Diagnostic test results will improve Outcome: Progressing

## 2021-03-31 ENCOUNTER — Inpatient Hospital Stay (HOSPITAL_COMMUNITY): Payer: BC Managed Care – PPO

## 2021-03-31 ENCOUNTER — Encounter (HOSPITAL_COMMUNITY)
Admission: EM | Disposition: A | Discharge status: Rehabilitation Facility | Payer: Self-pay | Source: Home / Self Care | Attending: Internal Medicine

## 2021-03-31 ENCOUNTER — Inpatient Hospital Stay (HOSPITAL_COMMUNITY): Payer: BC Managed Care – PPO | Admitting: Anesthesiology

## 2021-03-31 DIAGNOSIS — K5900 Constipation, unspecified: Secondary | ICD-10-CM | POA: Diagnosis not present

## 2021-03-31 HISTORY — PX: POSTERIOR LUMBAR FUSION 4 LEVEL: SHX6037

## 2021-03-31 LAB — CBC
HCT: 33.3 % — ABNORMAL LOW (ref 39.0–52.0)
Hemoglobin: 10.3 g/dL — ABNORMAL LOW (ref 13.0–17.0)
MCH: 26.9 pg (ref 26.0–34.0)
MCHC: 30.9 g/dL (ref 30.0–36.0)
MCV: 86.9 fL (ref 80.0–100.0)
Platelets: 424 10*3/uL — ABNORMAL HIGH (ref 150–400)
RBC: 3.83 MIL/uL — ABNORMAL LOW (ref 4.22–5.81)
RDW: 14.6 % (ref 11.5–15.5)
WBC: 7.3 10*3/uL (ref 4.0–10.5)
nRBC: 0 % (ref 0.0–0.2)

## 2021-03-31 LAB — BASIC METABOLIC PANEL
Anion gap: 8 (ref 5–15)
BUN: 13 mg/dL (ref 8–23)
CO2: 28 mmol/L (ref 22–32)
Calcium: 8.9 mg/dL (ref 8.9–10.3)
Chloride: 99 mmol/L (ref 98–111)
Creatinine, Ser: 0.51 mg/dL — ABNORMAL LOW (ref 0.61–1.24)
GFR, Estimated: >60 mL/min (ref >60)
Glucose, Bld: 103 mg/dL — ABNORMAL HIGH (ref 70–99)
Potassium: 4.1 mmol/L (ref 3.5–5.1)
Sodium: 135 mmol/L (ref 135–145)

## 2021-03-31 IMAGING — RF DG THORACIC SPINE 2V
1 series · 2 of 2 positions shown · non-contrast
Comparison: MRI [DATE]

CLINICAL DATA: Posterior decompression fusion

EXAM:
THORACIC SPINE 2 VIEWS

[Series 1: run · 2 of 2 slices shown]
[im 1/2]
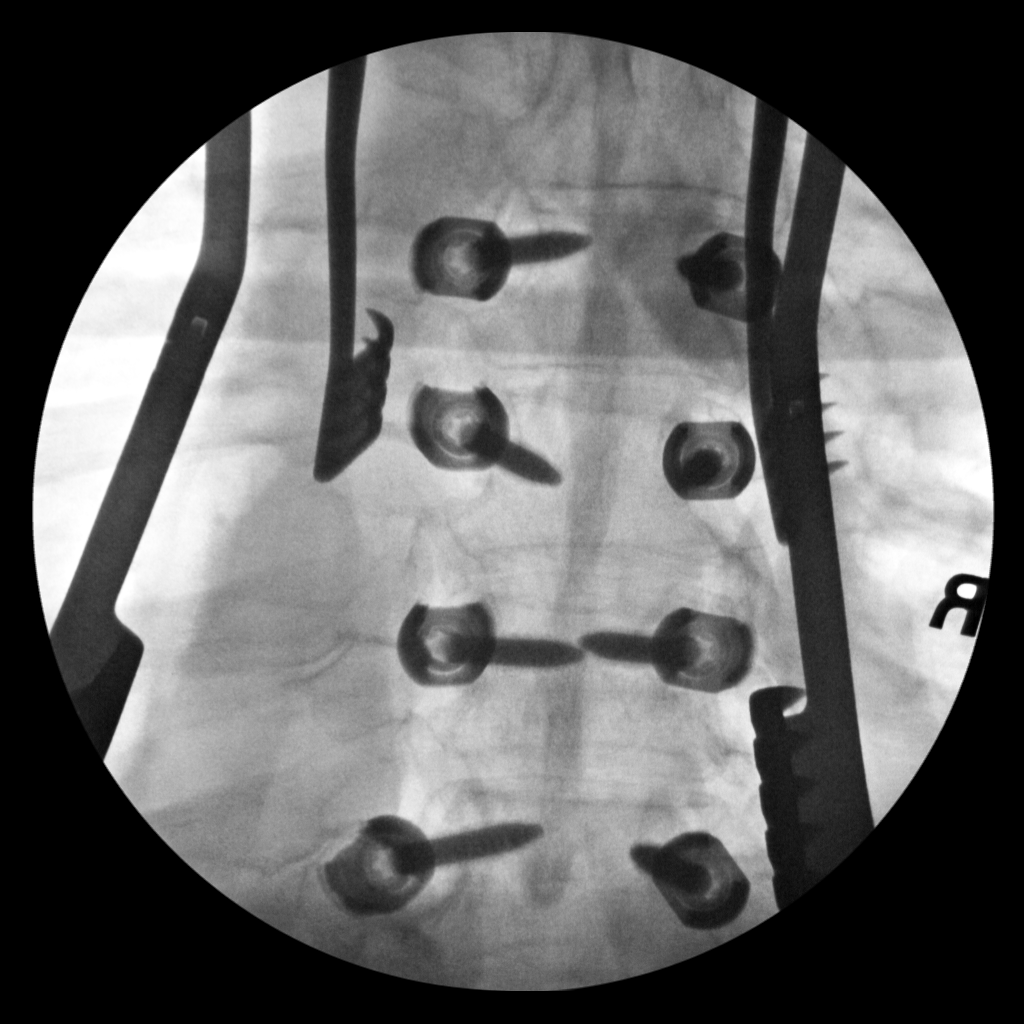
[im 2/2]
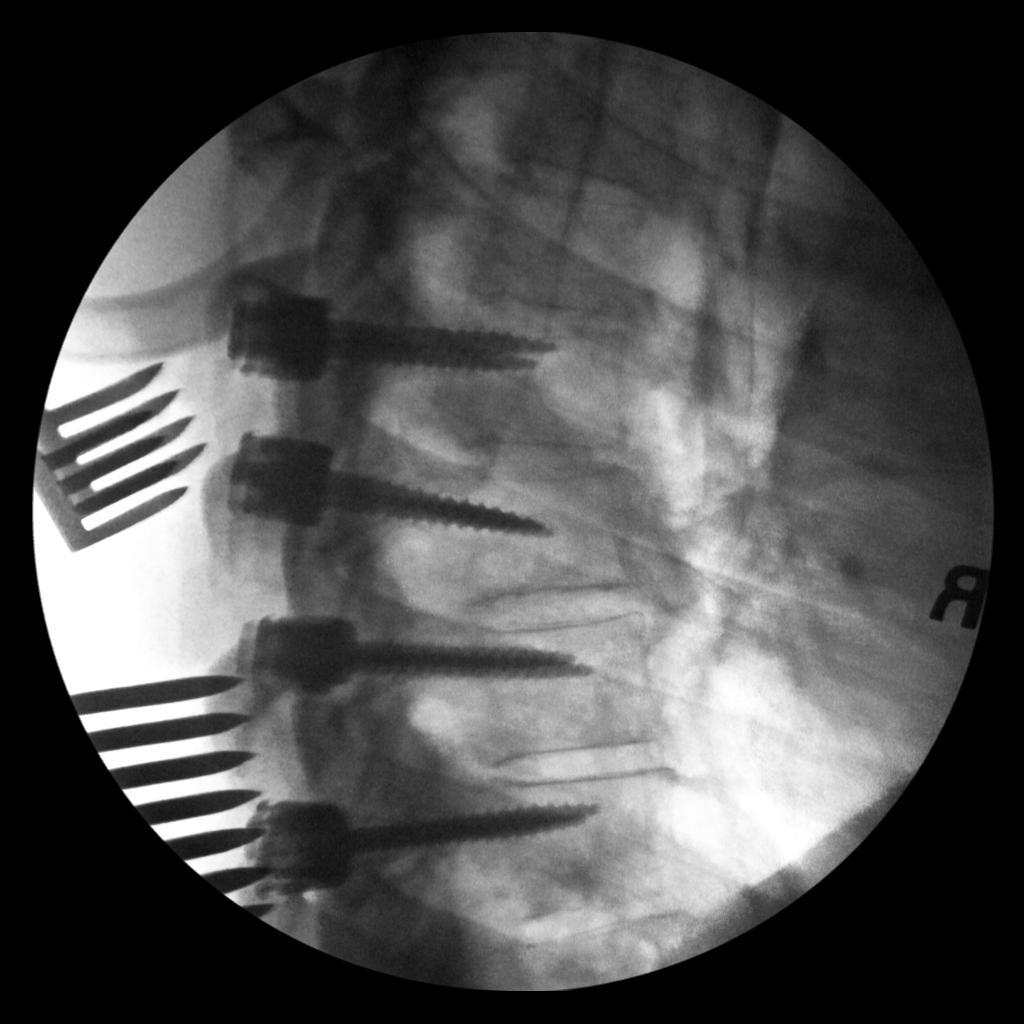

[2 of 2 positions shown; findings below may reference images not displayed]

FINDINGS: Two low resolution intraoperative spot views of the thoracic spine.
Total fluoroscopy time was 36 seconds. The images demonstrate
surgical instruments and fixating screws at the thoracic region.
IMPRESSION: Intraoperative fluoroscopic assistance provided during thoracic
spine surgery

## 2021-03-31 SURGERY — POSTERIOR LUMBAR FUSION 4 LEVEL
Anesthesia: General | Site: Back

## 2021-03-31 MED ORDER — LACTATED RINGERS IV SOLN: INTRAVENOUS | Status: DC

## 2021-03-31 MED ORDER — HYDROCODONE-ACETAMINOPHEN 5-325 MG PO TABS
1.0000 | ORAL_TABLET | ORAL | Status: DC | PRN
  Administered 2021-04-01: 1 via ORAL
  Filled 2021-03-31: qty 1

## 2021-03-31 MED ORDER — ALUM & MAG HYDROXIDE-SIMETH 200-200-20 MG/5ML PO SUSP: 30.0000 mL | Freq: Four times a day (QID) | ORAL | Status: DC | PRN

## 2021-03-31 MED ORDER — DEXAMETHASONE SODIUM PHOSPHATE 10 MG/ML IJ SOLN
INTRAMUSCULAR | Status: DC | PRN
  Administered 2021-03-31: 10 mg via INTRAVENOUS

## 2021-03-31 MED ORDER — ONDANSETRON HCL 4 MG/2ML IJ SOLN
INTRAMUSCULAR | Status: DC | PRN
  Administered 2021-03-31: 4 mg via INTRAVENOUS

## 2021-03-31 MED ORDER — KETAMINE HCL 50 MG/5ML IJ SOSY
PREFILLED_SYRINGE | INTRAMUSCULAR | Status: AC
  Filled 2021-03-31: qty 5

## 2021-03-31 MED ORDER — CHLORHEXIDINE GLUCONATE 0.12 % MT SOLN: 15.0000 mL | Freq: Once | OROMUCOSAL | Status: DC

## 2021-03-31 MED ORDER — CHLORHEXIDINE GLUCONATE 0.12 % MT SOLN
OROMUCOSAL | Status: AC
  Administered 2021-03-31: 15 mL via OROMUCOSAL
  Filled 2021-03-31: qty 15

## 2021-03-31 MED ORDER — ONDANSETRON HCL 4 MG/2ML IJ SOLN
INTRAMUSCULAR | Status: AC
  Filled 2021-03-31: qty 2

## 2021-03-31 MED ORDER — CHLORHEXIDINE GLUCONATE CLOTH 2 % EX PADS: 6.0000 | MEDICATED_PAD | Freq: Once | CUTANEOUS | Status: DC

## 2021-03-31 MED ORDER — CEFAZOLIN SODIUM-DEXTROSE 2-4 GM/100ML-% IV SOLN: 2.0000 g | Freq: Three times a day (TID) | INTRAVENOUS | Status: DC

## 2021-03-31 MED ORDER — LIDOCAINE-EPINEPHRINE 1 %-1:100000 IJ SOLN
INTRAMUSCULAR | Status: AC
  Filled 2021-03-31: qty 1

## 2021-03-31 MED ORDER — SODIUM CHLORIDE 0.9% FLUSH
3.0000 mL | Freq: Two times a day (BID) | INTRAVENOUS | Status: DC
  Administered 2021-03-31 – 2021-04-03 (×5): 3 mL via INTRAVENOUS

## 2021-03-31 MED ORDER — ACETAMINOPHEN 325 MG PO TABS: 650.0000 mg | ORAL_TABLET | ORAL | Status: DC | PRN

## 2021-03-31 MED ORDER — MENTHOL 3 MG MT LOZG
1.0000 | LOZENGE | OROMUCOSAL | Status: DC | PRN
  Filled 2021-03-31: qty 9

## 2021-03-31 MED ORDER — KETAMINE HCL 10 MG/ML IJ SOLN
INTRAMUSCULAR | Status: DC | PRN
  Administered 2021-03-31: 20 mg via INTRAVENOUS
  Administered 2021-03-31: 30 mg via INTRAVENOUS

## 2021-03-31 MED ORDER — METHOCARBAMOL 1000 MG/10ML IJ SOLN
500.0000 mg | Freq: Four times a day (QID) | INTRAVENOUS | Status: DC | PRN
  Administered 2021-04-01: 500 mg via INTRAVENOUS
  Filled 2021-03-31: qty 5

## 2021-03-31 MED ORDER — ROCURONIUM BROMIDE 10 MG/ML (PF) SYRINGE
PREFILLED_SYRINGE | INTRAVENOUS | Status: AC
  Filled 2021-03-31: qty 10

## 2021-03-31 MED ORDER — DICLOFENAC SODIUM 75 MG PO TBEC
75.0000 mg | DELAYED_RELEASE_TABLET | Freq: Two times a day (BID) | ORAL | Status: DC
  Administered 2021-04-01: 75 mg via ORAL
  Filled 2021-03-31 (×7): qty 1

## 2021-03-31 MED ORDER — PROPOFOL 10 MG/ML IV BOLUS
INTRAVENOUS | Status: AC
  Filled 2021-03-31: qty 20

## 2021-03-31 MED ORDER — DOCUSATE SODIUM 100 MG PO CAPS: 100.0000 mg | ORAL_CAPSULE | Freq: Every day | ORAL | Status: DC

## 2021-03-31 MED ORDER — POLYETHYLENE GLYCOL 3350 17 G PO PACK: 17.0000 g | PACK | Freq: Every day | ORAL | Status: DC | PRN

## 2021-03-31 MED ORDER — KCL IN DEXTROSE-NACL 20-5-0.45 MEQ/L-%-% IV SOLN
INTRAVENOUS | Status: DC
  Filled 2021-03-31: qty 1000

## 2021-03-31 MED ORDER — LIDOCAINE HCL (CARDIAC) PF 100 MG/5ML IV SOSY
PREFILLED_SYRINGE | INTRAVENOUS | Status: DC | PRN
  Administered 2021-03-31: 100 mg via INTRAVENOUS

## 2021-03-31 MED ORDER — HYDROMORPHONE HCL 1 MG/ML IJ SOLN
INTRAMUSCULAR | Status: DC | PRN
  Administered 2021-03-31: .5 mg via INTRAVENOUS

## 2021-03-31 MED ORDER — THROMBIN 5000 UNITS EX SOLR
CUTANEOUS | Status: AC
  Filled 2021-03-31: qty 5000

## 2021-03-31 MED ORDER — ACETAMINOPHEN 160 MG/5ML PO SOLN: 325.0000 mg | ORAL | Status: DC | PRN

## 2021-03-31 MED ORDER — FENTANYL CITRATE (PF) 250 MCG/5ML IJ SOLN
INTRAMUSCULAR | Status: AC
  Filled 2021-03-31: qty 5

## 2021-03-31 MED ORDER — SODIUM CHLORIDE (PF) 0.9 % IJ SOLN
INTRAMUSCULAR | Status: DC | PRN
Start: 2021-03-31 — End: 2021-03-31
  Administered 2021-03-31: 20 mL via INTRAVENOUS

## 2021-03-31 MED ORDER — MEPERIDINE HCL 25 MG/ML IJ SOLN: 6.2500 mg | INTRAMUSCULAR | Status: DC | PRN

## 2021-03-31 MED ORDER — AMLODIPINE BESYLATE 5 MG PO TABS: 5.0000 mg | ORAL_TABLET | Freq: Every day | ORAL | Status: DC

## 2021-03-31 MED ORDER — COLCHICINE 0.6 MG PO TABS
0.6000 mg | ORAL_TABLET | Freq: Every day | ORAL | Status: DC
  Administered 2021-04-03: 0.6 mg via ORAL
  Filled 2021-03-31 (×3): qty 1

## 2021-03-31 MED ORDER — BUPIVACAINE HCL (PF) 0.5 % IJ SOLN
INTRAMUSCULAR | Status: DC | PRN
  Administered 2021-03-31: 5 mL

## 2021-03-31 MED ORDER — 0.9 % SODIUM CHLORIDE (POUR BTL) OPTIME
TOPICAL | Status: DC | PRN
  Administered 2021-03-31: 1000 mL

## 2021-03-31 MED ORDER — BUPIVACAINE LIPOSOME 1.3 % IJ SUSP
INTRAMUSCULAR | Status: DC | PRN
  Administered 2021-03-31: 20 mL

## 2021-03-31 MED ORDER — DEXAMETHASONE SODIUM PHOSPHATE 10 MG/ML IJ SOLN
INTRAMUSCULAR | Status: AC
  Filled 2021-03-31: qty 1

## 2021-03-31 MED ORDER — THROMBIN 5000 UNITS EX SOLR
OROMUCOSAL | Status: DC | PRN
  Administered 2021-03-31: 5 mL via TOPICAL

## 2021-03-31 MED ORDER — BUPIVACAINE HCL (PF) 0.5 % IJ SOLN
INTRAMUSCULAR | Status: AC
  Filled 2021-03-31: qty 30

## 2021-03-31 MED ORDER — PANTOPRAZOLE SODIUM 40 MG IV SOLR
40.0000 mg | Freq: Every day | INTRAVENOUS | Status: DC
  Administered 2021-03-31: 40 mg via INTRAVENOUS
  Filled 2021-03-31: qty 40

## 2021-03-31 MED ORDER — SUGAMMADEX SODIUM 500 MG/5ML IV SOLN
INTRAVENOUS | Status: DC | PRN
  Administered 2021-03-31: 300 mg via INTRAVENOUS

## 2021-03-31 MED ORDER — MIDAZOLAM HCL 5 MG/5ML IJ SOLN
INTRAMUSCULAR | Status: DC | PRN
  Administered 2021-03-31: 2 mg via INTRAVENOUS

## 2021-03-31 MED ORDER — ONDANSETRON HCL 4 MG/2ML IJ SOLN: 4.0000 mg | Freq: Four times a day (QID) | INTRAMUSCULAR | Status: DC | PRN

## 2021-03-31 MED ORDER — CEFAZOLIN SODIUM-DEXTROSE 2-4 GM/100ML-% IV SOLN
2.0000 g | INTRAVENOUS | Status: AC
  Administered 2021-03-31: 2 g via INTRAVENOUS

## 2021-03-31 MED ORDER — CHLORHEXIDINE GLUCONATE 0.12 % MT SOLN: 15.0000 mL | Freq: Once | OROMUCOSAL | Status: AC

## 2021-03-31 MED ORDER — ADULT MULTIVITAMIN W/MINERALS CH
1.0000 | ORAL_TABLET | Freq: Every day | ORAL | Status: DC
  Administered 2021-04-01 – 2021-04-03 (×3): 1 via ORAL
  Filled 2021-03-31 (×3): qty 1

## 2021-03-31 MED ORDER — METHOCARBAMOL 500 MG PO TABS
500.0000 mg | ORAL_TABLET | Freq: Four times a day (QID) | ORAL | Status: DC | PRN
  Administered 2021-04-01 – 2021-04-03 (×4): 500 mg via ORAL
  Filled 2021-03-31 (×4): qty 1

## 2021-03-31 MED ORDER — FENTANYL CITRATE (PF) 100 MCG/2ML IJ SOLN
25.0000 ug | INTRAMUSCULAR | Status: DC | PRN
  Administered 2021-03-31: 50 ug via INTRAVENOUS
  Administered 2021-03-31 (×2): 25 ug via INTRAVENOUS

## 2021-03-31 MED ORDER — OXYCODONE HCL 5 MG PO TABS
5.0000 mg | ORAL_TABLET | Freq: Four times a day (QID) | ORAL | Status: DC | PRN
  Administered 2021-04-02 (×3): 5 mg via ORAL
  Filled 2021-03-31 (×4): qty 1

## 2021-03-31 MED ORDER — ONDANSETRON HCL 4 MG PO TABS: 4.0000 mg | ORAL_TABLET | Freq: Four times a day (QID) | ORAL | Status: DC | PRN

## 2021-03-31 MED ORDER — HYDROMORPHONE HCL 1 MG/ML IJ SOLN: 0.5000 mg | INTRAMUSCULAR | Status: DC | PRN

## 2021-03-31 MED ORDER — ONDANSETRON HCL 4 MG/2ML IJ SOLN: 4.0000 mg | Freq: Once | INTRAMUSCULAR | Status: DC | PRN

## 2021-03-31 MED ORDER — EPHEDRINE SULFATE 50 MG/ML IJ SOLN
INTRAMUSCULAR | Status: DC | PRN
  Administered 2021-03-31: 10 mg via INTRAVENOUS

## 2021-03-31 MED ORDER — OXYCODONE HCL 5 MG PO TABS
10.0000 mg | ORAL_TABLET | ORAL | Status: DC | PRN
  Administered 2021-03-31 – 2021-04-02 (×5): 10 mg via ORAL
  Filled 2021-03-31 (×4): qty 2

## 2021-03-31 MED ORDER — PROPOFOL 10 MG/ML IV BOLUS
INTRAVENOUS | Status: DC | PRN
  Administered 2021-03-31: 120 mg via INTRAVENOUS
  Administered 2021-03-31: 50 mg via INTRAVENOUS

## 2021-03-31 MED ORDER — PHENOL 1.4 % MT LIQD: 1.0000 | OROMUCOSAL | Status: DC | PRN

## 2021-03-31 MED ORDER — PHENYLEPHRINE HCL (PRESSORS) 10 MG/ML IV SOLN
INTRAVENOUS | Status: DC | PRN
  Administered 2021-03-31 (×4): 100 ug via INTRAVENOUS

## 2021-03-31 MED ORDER — FENTANYL CITRATE (PF) 100 MCG/2ML IJ SOLN
INTRAMUSCULAR | Status: AC
  Filled 2021-03-31: qty 2

## 2021-03-31 MED ORDER — FENTANYL CITRATE (PF) 100 MCG/2ML IJ SOLN
INTRAMUSCULAR | Status: DC | PRN
  Administered 2021-03-31: 150 ug via INTRAVENOUS
  Administered 2021-03-31 (×2): 100 ug via INTRAVENOUS
  Administered 2021-03-31: 50 ug via INTRAVENOUS
  Administered 2021-03-31 (×2): 100 ug via INTRAVENOUS

## 2021-03-31 MED ORDER — VITAMIN D 25 MCG (1000 UNIT) PO TABS
1000.0000 [IU] | ORAL_TABLET | Freq: Every day | ORAL | Status: DC
  Administered 2021-04-01 – 2021-04-03 (×3): 1000 [IU] via ORAL
  Filled 2021-03-31 (×3): qty 1

## 2021-03-31 MED ORDER — PHENYLEPHRINE HCL-NACL 20-0.9 MG/250ML-% IV SOLN
INTRAVENOUS | Status: DC | PRN
  Administered 2021-03-31: 30 ug/min via INTRAVENOUS

## 2021-03-31 MED ORDER — ACETAMINOPHEN 325 MG PO TABS: 325.0000 mg | ORAL_TABLET | ORAL | Status: DC | PRN

## 2021-03-31 MED ORDER — LIDOCAINE 2% (20 MG/ML) 5 ML SYRINGE
INTRAMUSCULAR | Status: AC
  Filled 2021-03-31: qty 5

## 2021-03-31 MED ORDER — BUPIVACAINE LIPOSOME 1.3 % IJ SUSP
INTRAMUSCULAR | Status: AC
  Filled 2021-03-31: qty 20

## 2021-03-31 MED ORDER — MIDAZOLAM HCL 2 MG/2ML IJ SOLN
INTRAMUSCULAR | Status: AC
  Filled 2021-03-31: qty 2

## 2021-03-31 MED ORDER — OXYCODONE HCL 5 MG/5ML PO SOLN: 5.0000 mg | Freq: Once | ORAL | Status: DC | PRN

## 2021-03-31 MED ORDER — ACETAMINOPHEN 650 MG RE SUPP: 650.0000 mg | RECTAL | Status: DC | PRN

## 2021-03-31 MED ORDER — SODIUM CHLORIDE 0.9 % IV SOLN: 250.0000 mL | INTRAVENOUS | Status: DC

## 2021-03-31 MED ORDER — ROCURONIUM BROMIDE 100 MG/10ML IV SOLN
INTRAVENOUS | Status: DC | PRN
  Administered 2021-03-31 (×2): 30 mg via INTRAVENOUS
  Administered 2021-03-31: 70 mg via INTRAVENOUS

## 2021-03-31 MED ORDER — OXYCODONE HCL 5 MG PO TABS: 5.0000 mg | ORAL_TABLET | Freq: Once | ORAL | Status: DC | PRN

## 2021-03-31 MED ORDER — SODIUM CHLORIDE 0.9% FLUSH
3.0000 mL | INTRAVENOUS | Status: DC | PRN
Start: 2021-03-31 — End: 2021-04-03

## 2021-03-31 MED ORDER — HYDROMORPHONE HCL 1 MG/ML IJ SOLN
INTRAMUSCULAR | Status: AC
  Filled 2021-03-31: qty 0.5

## 2021-03-31 MED ORDER — BISACODYL 10 MG RE SUPP
10.0000 mg | Freq: Every day | RECTAL | Status: DC | PRN
  Administered 2021-04-02: 10 mg via RECTAL
  Filled 2021-03-31: qty 1

## 2021-03-31 MED ORDER — DOCUSATE SODIUM 100 MG PO CAPS
100.0000 mg | ORAL_CAPSULE | Freq: Two times a day (BID) | ORAL | Status: DC
  Administered 2021-04-01 – 2021-04-03 (×4): 100 mg via ORAL
  Filled 2021-03-31 (×5): qty 1

## 2021-03-31 MED ORDER — LIDOCAINE-EPINEPHRINE 1 %-1:100000 IJ SOLN
INTRAMUSCULAR | Status: DC | PRN
  Administered 2021-03-31: 5 mL

## 2021-03-31 MED ORDER — ACETAMINOPHEN 325 MG PO TABS: 650.0000 mg | ORAL_TABLET | Freq: Four times a day (QID) | ORAL | Status: DC | PRN

## 2021-03-31 MED ORDER — SUGAMMADEX SODIUM 500 MG/5ML IV SOLN
INTRAVENOUS | Status: AC
  Filled 2021-03-31: qty 5

## 2021-03-31 MED ORDER — ZOLPIDEM TARTRATE 5 MG PO TABS: 5.0000 mg | ORAL_TABLET | Freq: Every evening | ORAL | Status: DC | PRN

## 2021-03-31 SURGICAL SUPPLY — 81 items
ADH SKN CLS APL DERMABOND .7 (GAUZE/BANDAGES/DRESSINGS) ×1
BAG COUNTER SPONGE SURGICOUNT (BAG) ×2 IMPLANT
BAG SPNG CNTER NS LX DISP (BAG) ×1
BASKET BONE COLLECTION (BASKET) ×2 IMPLANT
BLADE CLIPPER SURG (BLADE) ×1 IMPLANT
BUR MATCHSTICK NEURO 3.0 LAGG (BURR) ×2 IMPLANT
BUR PRECISION FLUTE 5.0 (BURR) ×2 IMPLANT
CANISTER SUCT 3000ML PPV (MISCELLANEOUS) ×2 IMPLANT
CARTRIDGE OIL MAESTRO DRILL (MISCELLANEOUS) ×1 IMPLANT
CNTNR URN SCR LID CUP LEK RST (MISCELLANEOUS) ×1 IMPLANT
CONT SPEC 4OZ STRL OR WHT (MISCELLANEOUS) ×4
COVER BACK TABLE 60X90IN (DRAPES) ×2 IMPLANT
DECANTER SPIKE VIAL GLASS SM (MISCELLANEOUS) ×2 IMPLANT
DERMABOND ADVANCED (GAUZE/BANDAGES/DRESSINGS) ×1
DERMABOND ADVANCED .7 DNX12 (GAUZE/BANDAGES/DRESSINGS) ×1 IMPLANT
DIFFUSER DRILL AIR PNEUMATIC (MISCELLANEOUS) ×2 IMPLANT
DRAPE C-ARM 42X72 X-RAY (DRAPES) ×2 IMPLANT
DRAPE C-ARMOR (DRAPES) ×2 IMPLANT
DRAPE LAPAROTOMY 100X72X124 (DRAPES) ×2 IMPLANT
DRAPE SURG 17X23 STRL (DRAPES) ×2 IMPLANT
DRSG OPSITE POSTOP 3X4 (GAUZE/BANDAGES/DRESSINGS) ×1 IMPLANT
DRSG OPSITE POSTOP 4X10 (GAUZE/BANDAGES/DRESSINGS) ×1 IMPLANT
DURAPREP 26ML APPLICATOR (WOUND CARE) ×2 IMPLANT
ELECT REM PT RETURN 9FT ADLT (ELECTROSURGICAL) ×2
ELECTRODE REM PT RTRN 9FT ADLT (ELECTROSURGICAL) ×1 IMPLANT
EVACUATOR 1/8 PVC DRAIN (DRAIN) ×1 IMPLANT
GAUZE 4X4 16PLY ~~LOC~~+RFID DBL (SPONGE) ×2 IMPLANT
GAUZE SPONGE 4X4 12PLY STRL (GAUZE/BANDAGES/DRESSINGS) ×1 IMPLANT
GLOVE EXAM NITRILE XL STR (GLOVE) IMPLANT
GLOVE SRG 8 PF TXTR STRL LF DI (GLOVE) ×2 IMPLANT
GLOVE SURG ENC MOIS LTX SZ8 (GLOVE) ×4 IMPLANT
GLOVE SURG LTX SZ8 (GLOVE) ×4 IMPLANT
GLOVE SURG POLYISO LF SZ7 (GLOVE) ×1 IMPLANT
GLOVE SURG UNDER POLY LF SZ7.5 (GLOVE) ×1 IMPLANT
GLOVE SURG UNDER POLY LF SZ8 (GLOVE) ×4
GLOVE SURG UNDER POLY LF SZ8.5 (GLOVE) ×4 IMPLANT
GOWN STRL REUS W/ TWL LRG LVL3 (GOWN DISPOSABLE) IMPLANT
GOWN STRL REUS W/ TWL XL LVL3 (GOWN DISPOSABLE) ×2 IMPLANT
GOWN STRL REUS W/TWL 2XL LVL3 (GOWN DISPOSABLE) ×4 IMPLANT
GOWN STRL REUS W/TWL LRG LVL3 (GOWN DISPOSABLE) ×4
GOWN STRL REUS W/TWL XL LVL3 (GOWN DISPOSABLE) ×4
HEMOSTAT POWDER KIT SURGIFOAM (HEMOSTASIS) ×2 IMPLANT
KIT BASIN OR (CUSTOM PROCEDURE TRAY) ×2 IMPLANT
KIT POSITION SURG JACKSON T1 (MISCELLANEOUS) ×2 IMPLANT
KIT TURNOVER KIT B (KITS) ×2 IMPLANT
MILL MEDIUM DISP (BLADE) ×2 IMPLANT
NDL HYPO 25X1 1.5 SAFETY (NEEDLE) ×1 IMPLANT
NDL SPNL 18GX3.5 QUINCKE PK (NEEDLE) IMPLANT
NEEDLE HYPO 25X1 1.5 SAFETY (NEEDLE) ×2 IMPLANT
NEEDLE SPNL 18GX3.5 QUINCKE PK (NEEDLE) ×2 IMPLANT
NS IRRIG 1000ML POUR BTL (IV SOLUTION) ×2 IMPLANT
OIL CARTRIDGE MAESTRO DRILL (MISCELLANEOUS) ×2
PACK LAMINECTOMY NEURO (CUSTOM PROCEDURE TRAY) ×2 IMPLANT
PAD ARMBOARD 7.5X6 YLW CONV (MISCELLANEOUS) ×3 IMPLANT
PATTIES SURGICAL .5 X.5 (GAUZE/BANDAGES/DRESSINGS) IMPLANT
PATTIES SURGICAL .5 X1 (DISPOSABLE) IMPLANT
PATTIES SURGICAL 1X1 (DISPOSABLE) IMPLANT
PUTTY DBM PROPEL LRG (Putty) ×2 IMPLANT
RASP 3.0MM (RASP) ×1 IMPLANT
ROD RELINE 0-0 CON M 5.0/6.0MM (Rod) ×1 IMPLANT
ROD RELINE O-H CON M 5.0/6.0MM (Rod) ×1 IMPLANT
ROD RELINE-O 5.5X300 STRT NS (Rod) IMPLANT
ROD RELINE-O 5.5X300MM STRT (Rod) ×4 IMPLANT
SCREW LOCK RELINE 5.5 TULIP (Screw) ×11 IMPLANT
SCREW RELINE-O POLY 5.5X40 (Screw) ×4 IMPLANT
SCREW RELINE-O POLY 5.5X45MM (Screw) ×4 IMPLANT
SPONGE SURGIFOAM ABS GEL 100 (HEMOSTASIS) IMPLANT
SPONGE T-LAP 4X18 ~~LOC~~+RFID (SPONGE) ×2 IMPLANT
STAPLER SKIN PROX WIDE 3.9 (STAPLE) IMPLANT
SUT VIC AB 1 CT1 18XBRD ANBCTR (SUTURE) ×2 IMPLANT
SUT VIC AB 1 CT1 8-18 (SUTURE) ×4
SUT VIC AB 2-0 CT1 18 (SUTURE) ×5 IMPLANT
SUT VIC AB 3-0 SH 8-18 (SUTURE) ×5 IMPLANT
SWAB CULTURE ESWAB REG 1ML (MISCELLANEOUS) ×2 IMPLANT
SWAB CULTURE LIQ STUART DBL (MISCELLANEOUS) ×2 IMPLANT
SYR 20ML LL LF (SYRINGE) ×1 IMPLANT
SYR 5ML LL (SYRINGE) IMPLANT
TOWEL GREEN STERILE (TOWEL DISPOSABLE) ×2 IMPLANT
TOWEL GREEN STERILE FF (TOWEL DISPOSABLE) ×2 IMPLANT
TRAY FOLEY MTR SLVR 16FR STAT (SET/KITS/TRAYS/PACK) ×2 IMPLANT
WATER STERILE IRR 1000ML POUR (IV SOLUTION) ×2 IMPLANT

## 2021-03-31 NOTE — Progress Notes (Signed)
HD#[id number] SUBJECTIVE:  Patient Summary:  No acute overnight events.  He states that he has not had a bowel movement in the past day.  He slept well throughout the night and feels that the pain has gone down a bit.  He has been working with PT on exercises which are going well.  OBJECTIVE:  Vital Signs: Vitals:   03/31/21 0337 03/31/21 0911 03/31/21 1159 03/31/21 1333  BP: 136/88 (!) 141/87 133/78 (!) 147/84  Pulse: 86 70 69 88  Resp: 17 16 16 18   Temp: 98.8 F (37.1 C) 98 F (36.7 C) 98 F (36.7 C) 98.4 F (36.9 C)  TempSrc: Oral Oral Oral Oral  SpO2: 97% 97% 98% 96%  Weight:      Height:       Supplemental O2: Room Air SpO2: 96 % O2 Flow Rate (L/min): 2 L/min  Filed Weights   03/15/21 0014  Weight: 106.1 kg    Intake/Output Summary (Last 24 hours) at 03/31/2021 1336 Last data filed at 03/31/2021 0559 Gross per 24 hour  Intake --  Output 300 ml  Net -300 ml   Net IO Since Admission: -5,984.85 mL [03/31/21 1336]   Physical Exam: Constitutional: Elderly gentleman lying in bed.  No acute distress noted. Cardio: Regular rhythm.  No murmurs, rubs, gallops. Pulm: Clear to auscultation bilaterally. Normal work of breathing on room air. Abdomen: Soft, nontender, nondistended. MSK: Negative for bilateral upper and lower extremity edema. Skin: Skin is warm and dry. Neurologic exam: Mental status: A&Ox3. Sensory: Decreased sensation to light touch in bilateral lower extremities, improved compared to exam yesterday. Psychiatric: Normal mood and affect   Patient Lines/Drains/Airways Status     Active Line/Drains/Airways     Name Placement date Placement time Site Days   Peripheral IV 03/14/21 20 G 1.88" Anterior;Left;Proximal Forearm 03/14/21  1549  Forearm  13   PICC Single Lumen 01/15/21 Right Basilic 39 cm 0 cm 01/15/21  01/17/21  Basilic  71   Closed System Drain 1 Left Flank Bulb (JP) 12 Fr. 01/27/21  1407  Flank  59   Closed System Drain Lateral LLQ Bulb (JP) 10 Fr.  03/16/21  1335  LLQ  11   External Urinary Catheter 03/24/21  1440  --  3   Incision (Closed) 04/18/20 Flank Left 04/18/20  1419  -- 343   Incision (Closed) 01/13/21 Back 01/13/21  1016  -- 73   Incision (Closed) 03/14/21 Back 03/14/21  2006  -- 13   Wound / Incision (Open or Dehisced) 01/29/21 Non-pressure wound;Other (Comment) Buttocks Left 01/29/21  0900  Buttocks  57             ASSESSMENT/PLAN:  Assessment: Active Problems:   Vertebral osteomyelitis (HCC)   Empyema (HCC)   Sepsis due to Escherichia coli (E. coli) (HCC)   Retroperitoneal fluid collection   Constipation   Epidural abscess   Discitis of thoracic region  Plan: #Epidural abscess #Retroperitoneal fluid collection Patient states that his pain levels have been stable to improving. Neurosurgery is on board, plan for thoracic stabilization surgery today, 10/04.  -Neurosurgery on board, appreciate their recommendations. -Patient is on bed rest with exception of mobilization to bedside commode. Continue to work with PT in bed. -TSLO brace when out of bed and as needed -ID on board, appreciate their recommendations             -Continue cefazolin 2 g IV every 8 hours -Trend inflammatory markers every 48 hours  #Constipation Patient  is on the bowel regimen as below.  He endorses having adequate bowel movements when his suppositories are given. -Continue bowel regimen of Dulcolax suppository 10 mg rectal daily, lactulose enema 300 mL rectal daily as needed, lubiprostone 24 mcg oral twice daily, MiraLAX daily, senna-docusate 2 tablets oral BID for bowel regimen   #Hypertension Chronically managed. BP of 136/88 today. -Continue amlodipine 5 mg daily  Best Practice: Diet: Regular diet IVF: Fluids: None VTE: SCDs Start: 03/14/21 1708 Code: Full AB: Cefazolin 2 g IV 3 times daily DISPO: Continue to monitor inpatient.  Signature: Fredonia Highland, MD Internal Medicine Resident, PGY-1 [facility]  Pager: [phone] 1:36 PM, 03/31/2021   Please contact the on call pager after 5 pm and on weekends at [phone].

## 2021-03-31 NOTE — H&P (View-Only) (Signed)
Subjective: Patient reports that he had a good night and was able to sleep well. He is eager to proceed with surgery. No acute events overnight.   Objective: Vital signs in last 24 hours: Temp:  [97.8 F (36.6 C)-98.8 F (37.1 C)] 98.8 F (37.1 C) (10/04 0337) Pulse Rate:  [74-96] 86 (10/04 0337) Resp:  [17-18] 17 (10/04 0337) BP: (129-145)/(79-88) 136/88 (10/04 0337) SpO2:  [97 %] 97 % (10/04 0337)  Intake/Output from previous day: 10/03 0701 - 10/04 0700 In: 477 [P.O.:477] Out: 600 [Urine:600] Intake/Output this shift: No intake/output data recorded.  Physical Exam: Patient is awake, A/O X 4, and conversant. NAD. VSS. Speech fluent and appropriate. MAEW with good strength. PERLA, EOMI. CNs grossly intact. Dressing is CDI. Incision is well approximated with no drainage, erythema, or edema.  Lab Results: Recent Labs    03/30/21 0500 03/31/21 0600  WBC 7.9 7.3  HGB 10.8* 10.3*  HCT 34.9* 33.3*  PLT 458* 424*   BMET Recent Labs    03/30/21 0500 03/31/21 0600  NA 135 135  K 3.4* 4.1  CL 98 99  CO2 27 28  GLUCOSE 112* 103*  BUN 11 13  CREATININE 0.58* 0.51*  CALCIUM 8.9 8.9    Studies/Results: No results found.  Assessment/Plan: 70 y.o. 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 The patient's neurological status remains at his baseline. Back pain and BLE pain continue unchanged is severely limiting his ability to mobilize.  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 Constipation continues to persist. I discussed the surgical plan with the patient and his wife again this mornming and answered all of their questions. Proceed with T6-T10 posterior decompression and fusion today.     PLAN: - Continue Abx per ID - Continue working with PT/OT in the bed - Continue working on pain control - Encourage mobilization as  tolerated  - TLSO brace when OOB and PRN, he does not need to wear the brace while in bed  - Thoracic stabilization surgery today / T6-T10 posterior decompression and fusion - Hold Lovenox -Continue NPO status  LOS: 16 days     Council Mechanic, DNP, NP-C 03/31/2021, 7:56 AM

## 2021-03-31 NOTE — Progress Notes (Signed)
Patient refused CPAP, hospital unit still at bedside.

## 2021-03-31 NOTE — Progress Notes (Signed)
OT Cancellation Note  Patient Details Name: KENTO GOSSMAN MRN: [id number] DOB: 09-05-1950   Cancelled Treatment:    Reason Eval/Treat Not Completed: Patient at procedure or test/ unavailable (Pt in OR for back sx. OT treat/eval to f/u post sx as appropriate)  Aodhan Scheidt A Angeni Chaudhuri 03/31/2021, 11:35 AM

## 2021-03-31 NOTE — Op Note (Signed)
03/31/2021  7:00 PM  PATIENT:  Marco Kennedy  70 y.o. 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  PROCEDURE:  Procedure(s): Thoracic six to Thoracic nine  Posterior decompression/Fusion with removal hardware thoracic ten (N/A) and extension of fusion to thoracic eleven, decompression of spinal cord dura T 9 - T 11 with laminectomy, posterolateral arthrodesis T 6 - T 11 levels  SURGEON:  Surgeon(s) and Role:    [name], MD - Primary    * Dawley, Alan Mulder, DO - Assisting  PHYSICIAN ASSISTANT:   ASSISTANTS: Poteat, RN   ANESTHESIA:   local and general  EBL:  200 mL   BLOOD ADMINISTERED:none  DRAINS: (Medium) Jackson-Pratt drain(s) with closed bulb suction in the epidural space   LOCAL MEDICATIONS USED:  MARCAINE    and LIDOCAINE   SPECIMEN:  Source of Specimen:  Operative cultures  DISPOSITION OF SPECIMEN:  Microbiology  COUNTS:  YES  TOURNIQUET:  * No tourniquets in log *  DICTATION: Patient is 70 year old man with previous surgery for scoliosis T 10 - pelvis levels who developed osteomyelitis and discitis with cord compression and kyphotic deformity.  He underwent emergent decompression at T 10, but now comes back to OR for reconstruction and correction of thoracic kyphosis and decompression of thoracic spinal cord.   Procedure: Patient was placed in a prone position on the [place] table after smooth and uncomplicated induction of general endotracheal anesthesia. His upper, mid and low back was prepped and draped in usual sterile fashion with betadine scrub and DuraPrep. Area of incision was infiltrated with local lidocaine. Incision was made to the thoracic and lumbodorsal fascia was incised and exposure was performed of the T 6- T 11 spinous processes laminae facet joint and transverse  processes. Rods were cut between T 10 and T 11 and T 10 screws, which were loose, were removed.  New screws were placed under C arm guidance T 9 and T 8: (5.5 x 45 mm); T 7 and T 6 (5.5 x 40 mm).  Connectors were placed between T 11 and T 12 bilaterally and rods were fashioned and affixed to the screws.  Rods were  cut and curved to restore patient's normal kyphosis. There was good correction of scoliotic curvature. The posterolateral region was extensively decorticated and local autograft was placed along with 20 cc DBM. Intraoperative fluoroscopy confirmed correct orientationin the AP and lateral plane and positioning of all screws.  The Final x-rays demonstrated well-positioned pedicle screw fixation. The spinal cord dura was then carefully decompressed from T 9 through T 11 levels. There was significant scarred ligament and this was carefully elevated to decompress the spinal cord dura.   The wound was irrigated. A medium Hemovac drain was placed.  Fascia was closed with 1 Vicryl sutures skin edges were reapproximated 2 and 3-0 Vicryl sutures. The wound was dressed with Dermabond and an occlusive dressing.  The patient was extubated in the operating room and taken to recovery in stable satisfactory condition he tolerated the opeeration well counts were correct at the end of the case.   PLAN OF CARE: Admit to inpatient   PATIENT DISPOSITION:  PACU - hemodynamically stable.   Delay start of Pharmacological VTE agent (>24hrs) due to surgical blood loss or risk of bleeding: yes

## 2021-03-31 NOTE — Anesthesia Preprocedure Evaluation (Addendum)
Anesthesia Evaluation  Patient identified by MRN, date of birth, ID band Patient awake    Reviewed: Allergy & Precautions, NPO status , Patient's Chart, lab work & pertinent test results  Airway Mallampati: II  TM Distance: >3 FB Neck ROM: Full    Dental  (+) Teeth Intact, Caps,    Pulmonary sleep apnea and Continuous Positive Airway Pressure Ventilation ,    Pulmonary exam normal        Cardiovascular negative cardio ROS   Rhythm:Regular Rate:Normal     Neuro/Psych negative neurological ROS  negative psych ROS   GI/Hepatic negative GI ROS, Neg liver ROS,   Endo/Other  Morbid obesity  Renal/GU   negative genitourinary   Musculoskeletal  (+) Arthritis , Osteoarthritis,  Back abscess    Abdominal (+)  Abdomen: soft.    Peds  Hematology  (+) Blood dyscrasia, anemia ,   Anesthesia Other Findings   Reproductive/Obstetrics                           Anesthesia Physical  Anesthesia Plan  ASA: 3 and emergent  Anesthesia Plan: General   Post-op Pain Management:    Induction: Intravenous  PONV Risk Score and Plan: 2 and Ondansetron, Dexamethasone and Treatment may vary due to age or medical condition  Airway Management Planned: Oral ETT  Additional Equipment: Arterial line  Intra-op Plan:   Post-operative Plan: Extubation in OR  Informed Consent: I have reviewed the patients History and Physical, chart, labs and discussed the procedure including the risks, benefits and alternatives for the proposed anesthesia with the patient or authorized representative who has indicated his/her understanding and acceptance.     Dental advisory given  Plan Discussed with: CRNA and Anesthesiologist  Anesthesia Plan Comments: (2 large bore IV's + clear sight vs art line        ECHO 07/22: 1. Left ventricular ejection fraction, by estimation, is 65 to 70%. The  left ventricle has normal  function. The left ventricle has no regional  wall motion abnormalities. There is mild left ventricular hypertrophy.  Left ventricular diastolic parameters  were normal.  2. Right ventricular systolic function is normal. The right ventricular  size is normal. Tricuspid regurgitation signal is inadequate for assessing  PA pressure.  3. The mitral valve is normal in structure. No evidence of mitral valve  regurgitation.  4. The aortic valve was not well visualized. Aortic valve regurgitation  is not visualized. No aortic stenosis is present.  5. The inferior vena cava is normal in size with greater than 50%  respiratory variability, suggesting right atrial pressure of 3 mmHg. )      Anesthesia Quick Evaluation

## 2021-03-31 NOTE — Transfer of Care (Signed)
Immediate Anesthesia Transfer of Care Note  Patient: Marco Kennedy  Procedure(s) Performed: Thoracic six to Thoracic nine  Posterior decompression/Fusion with removal hardware thoracic ten (Back)  Patient Location: PACU  Anesthesia Type:General  Level of Consciousness: drowsy  Airway & Oxygen Therapy: Patient Spontanous Breathing and Patient connected to face mask oxygen  Post-op Assessment: Report given to RN and Post -op Vital signs reviewed and stable  Post vital signs: Reviewed and stable  Last Vitals:  Vitals Value Taken Time  BP 152/93 03/31/21 1920  Temp 36.4 C 03/31/21 1920  Pulse 72 03/31/21 1932  Resp 20 03/31/21 1932  SpO2 100 % 03/31/21 1932  Vitals shown include unvalidated device data.  Last Pain:  Vitals:   03/31/21 1920  TempSrc:   PainSc: 0-No pain      Patients Stated Pain Goal: 3 (03/29/21 0830)  Complications: No notable events documented.

## 2021-03-31 NOTE — Anesthesia Procedure Notes (Signed)
Procedure Name: Intubation Date/Time: 03/31/2021 3:34 PM Performed by: Jonna Munro, CRNA Pre-anesthesia Checklist: Patient identified, Emergency Drugs available, Suction available, Patient being monitored and Timeout performed Patient Re-evaluated:Patient Re-evaluated prior to induction Oxygen Delivery Method: Circle system utilized Preoxygenation: Pre-oxygenation with 100% oxygen Induction Type: IV induction Ventilation: Mask ventilation without difficulty Laryngoscope Size: Mac and 4 Grade View: Grade I Tube type: Oral Tube size: 7.5 mm Number of attempts: 1 Airway Equipment and Method: Stylet Placement Confirmation: positive ETCO2, ETT inserted through vocal cords under direct vision and breath sounds checked- equal and bilateral Secured at: 20 cm Tube secured with: Tape Dental Injury: Teeth and Oropharynx as per pre-operative assessment

## 2021-03-31 NOTE — Interval H&P Note (Signed)
History and Physical Interval Note:  03/31/2021 2:44 PM  Marco Kennedy  has presented today for surgery, with the diagnosis of Thoracic junctional kyphosis.  The various methods of treatment have been discussed with the patient and family. After consideration of risks, benefits and other options for treatment, the patient has consented to  Procedure(s): Thoracic 6 to Thoracic 10 Posterior decompression/Fusion (N/A), possibly to T 4 levels as a surgical intervention.  The patient's history has been reviewed, patient examined, no change in status, stable for surgery.  I have reviewed the patient's chart and labs.  Questions were answered to the patient's satisfaction.     [name]

## 2021-03-31 NOTE — Brief Op Note (Signed)
03/31/2021  7:00 PM  PATIENT:  Marco Kennedy  70 y.o. 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  PROCEDURE:  Procedure(s): Thoracic six to Thoracic nine  Posterior decompression/Fusion with removal hardware thoracic ten (N/A) and extension of fusion to thoracic eleven, decompression of spinal cord dura T 9 - T 11 with laminectomy, posterolateral arthrodesis T 6 - T 11 levels  SURGEON:  Surgeon(s) and Role:    * Shanine Kreiger, MD - Primary    * Dawley, Troy C, DO - Assisting  PHYSICIAN ASSISTANT:   ASSISTANTS: Poteat, RN   ANESTHESIA:   local and general  EBL:  200 mL   BLOOD ADMINISTERED:none  DRAINS: (Medium) Jackson-Pratt drain(s) with closed bulb suction in the epidural space   LOCAL MEDICATIONS USED:  MARCAINE    and LIDOCAINE   SPECIMEN:  Source of Specimen:  Operative cultures  DISPOSITION OF SPECIMEN:  Microbiology  COUNTS:  YES  TOURNIQUET:  * No tourniquets in log *  DICTATION: Patient is 70-year-old man with previous surgery for scoliosis T 10 - pelvis levels who developed osteomyelitis and discitis with cord compression and kyphotic deformity.  He underwent emergent decompression at T 10, but now comes back to OR for reconstruction and correction of thoracic kyphosis and decompression of thoracic spinal cord.   Procedure: Patient was placed in a prone position on the [place] table after smooth and uncomplicated induction of general endotracheal anesthesia. His upper, mid and low back was prepped and draped in usual sterile fashion with betadine scrub and DuraPrep. Area of incision was infiltrated with local lidocaine. Incision was made to the thoracic and lumbodorsal fascia was incised and exposure was performed of the T 6- T 11 spinous processes laminae facet joint and transverse  processes. Rods were cut between T 10 and T 11 and T 10 screws, which were loose, were removed.  New screws were placed under C arm guidance T 9 and T 8: (5.5 x 45 mm); T 7 and T 6 (5.5 x 40 mm).  Connectors were placed between T 11 and T 12 bilaterally and rods were fashioned and affixed to the screws.  Rods were  cut and curved to restore patient's normal kyphosis. There was good correction of scoliotic curvature. The posterolateral region was extensively decorticated and local autograft was placed along with 20 cc DBM. Intraoperative fluoroscopy confirmed correct orientationin the AP and lateral plane and positioning of all screws.  The Final x-rays demonstrated well-positioned pedicle screw fixation. The spinal cord dura was then carefully decompressed from T 9 through T 11 levels. There was significant scarred ligament and this was carefully elevated to decompress the spinal cord dura.   The wound was irrigated. A medium Hemovac drain was placed.  Fascia was closed with 1 Vicryl sutures skin edges were reapproximated 2 and 3-0 Vicryl sutures. The wound was dressed with Dermabond and an occlusive dressing.  The patient was extubated in the operating room and taken to recovery in stable satisfactory condition he tolerated the opeeration well counts were correct at the end of the case.   PLAN OF CARE: Admit to inpatient   PATIENT DISPOSITION:  PACU - hemodynamically stable.   Delay start of Pharmacological VTE agent (>24hrs) due to surgical blood loss or risk of bleeding: yes  

## 2021-03-31 NOTE — Anesthesia Postprocedure Evaluation (Signed)
Anesthesia Post Note  Patient: Marco Kennedy  Procedure(s) Performed: Thoracic six to Thoracic nine  Posterior decompression/Fusion with removal hardware thoracic ten (Back)     Patient location during evaluation: PACU Anesthesia Type: General Level of consciousness: awake Pain management: pain level controlled Vital Signs Assessment: post-procedure vital signs reviewed and stable Respiratory status: spontaneous breathing, nonlabored ventilation, respiratory function stable and patient connected to nasal cannula oxygen Cardiovascular status: blood pressure returned to baseline and stable Postop Assessment: no apparent nausea or vomiting Anesthetic complications: no   No notable events documented.  Last Vitals:  Vitals:   03/31/21 2035 03/31/21 2052  BP: (!) 148/87 (!) 149/86  Pulse: 84 87  Resp: 16 19  Temp: 36.4 C (!) 36.4 C  SpO2: 100% 98%    Last Pain:  Vitals:   03/31/21 2106  TempSrc:   PainSc: 6                  Jaskarn Schweer P Amariona Rathje

## 2021-03-31 NOTE — Plan of Care (Signed)
  Problem: Education: Goal: Knowledge of General Education information will improve Description: Including pain rating scale, medication(s)/side effects and non-pharmacologic comfort measures Outcome: Progressing   Problem: Clinical Measurements: Goal: Ability to maintain clinical measurements within normal limits will improve Outcome: Progressing Goal: Will remain free from infection Outcome: Progressing Goal: Diagnostic test results will improve Outcome: Progressing Goal: Respiratory complications will improve Outcome: Progressing Goal: Cardiovascular complication will be avoided Outcome: Progressing   Problem: Pain Management: Goal: Pain level will decrease Outcome: Progressing

## 2021-03-31 NOTE — Progress Notes (Signed)
Subjective: Patient reports that he had a good night and was able to sleep well. He is eager to proceed with surgery. No acute events overnight.   Objective: Vital signs in last 24 hours: Temp:  [97.8 F (36.6 C)-98.8 F (37.1 C)] 98.8 F (37.1 C) (10/04 0337) Pulse Rate:  [74-96] 86 (10/04 0337) Resp:  [17-18] 17 (10/04 0337) BP: (129-145)/(79-88) 136/88 (10/04 0337) SpO2:  [97 %] 97 % (10/04 0337)  Intake/Output from previous day: 10/03 0701 - 10/04 0700 In: 477 [P.O.:477] Out: 600 [Urine:600] Intake/Output this shift: No intake/output data recorded.  Physical Exam: Patient is awake, A/O X 4, and conversant. NAD. VSS. Speech fluent and appropriate. MAEW with good strength. PERLA, EOMI. CNs grossly intact. Dressing is CDI. Incision is well approximated with no drainage, erythema, or edema.  Lab Results: Recent Labs    03/30/21 0500 03/31/21 0600  WBC 7.9 7.3  HGB 10.8* 10.3*  HCT 34.9* 33.3*  PLT 458* 424*   BMET Recent Labs    03/30/21 0500 03/31/21 0600  NA 135 135  K 3.4* 4.1  CL 98 99  CO2 27 28  GLUCOSE 112* 103*  BUN 11 13  CREATININE 0.58* 0.51*  CALCIUM 8.9 8.9    Studies/Results: No results found.  Assessment/Plan: 70 y.o. 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 The patient's neurological status remains at his baseline. Back pain and BLE pain continue unchanged is severely limiting his ability to mobilize.  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 Constipation continues to persist. I discussed the surgical plan with the patient and his wife again this mornming and answered all of their questions. Proceed with T6-T10 posterior decompression and fusion today.     PLAN: - Continue Abx per ID - Continue working with PT/OT in the bed - Continue working on pain control - Encourage mobilization as  tolerated  - TLSO brace when OOB and PRN, he does not need to wear the brace while in bed  - Thoracic stabilization surgery today / T6-T10 posterior decompression and fusion - Hold Lovenox -Continue NPO status  LOS: 16 days     Aven Christen L Jerren Flinchbaugh, DNP, NP-C 03/31/2021, 7:56 AM     

## 2021-04-01 ENCOUNTER — Inpatient Hospital Stay (HOSPITAL_COMMUNITY): Payer: BC Managed Care – PPO

## 2021-04-01 ENCOUNTER — Encounter (HOSPITAL_COMMUNITY): Payer: Self-pay | Admitting: Neurosurgery

## 2021-04-01 LAB — BASIC METABOLIC PANEL
Anion gap: 9 (ref 5–15)
BUN: 14 mg/dL (ref 8–23)
CO2: 25 mmol/L (ref 22–32)
Calcium: 8.6 mg/dL — ABNORMAL LOW (ref 8.9–10.3)
Chloride: 98 mmol/L (ref 98–111)
Creatinine, Ser: 0.7 mg/dL (ref 0.61–1.24)
GFR, Estimated: >60 mL/min (ref >60)
Glucose, Bld: 155 mg/dL — ABNORMAL HIGH (ref 70–99)
Potassium: 5.2 mmol/L — ABNORMAL HIGH (ref 3.5–5.1)
Sodium: 132 mmol/L — ABNORMAL LOW (ref 135–145)

## 2021-04-01 LAB — CBC
HCT: 31.4 % — ABNORMAL LOW (ref 39.0–52.0)
Hemoglobin: 9.9 g/dL — ABNORMAL LOW (ref 13.0–17.0)
MCH: 27.2 pg (ref 26.0–34.0)
MCHC: 31.5 g/dL (ref 30.0–36.0)
MCV: 86.3 fL (ref 80.0–100.0)
Platelets: 458 10*3/uL — ABNORMAL HIGH (ref 150–400)
RBC: 3.64 MIL/uL — ABNORMAL LOW (ref 4.22–5.81)
RDW: 14.5 % (ref 11.5–15.5)
WBC: 16.3 10*3/uL — ABNORMAL HIGH (ref 4.0–10.5)
nRBC: 0 % (ref 0.0–0.2)

## 2021-04-01 IMAGING — DX DG CHEST 1V PORT
1 series · 1 of 1 positions shown · non-contrast
Comparison: [DATE]

CLINICAL DATA: Evaluate PICC line position.

EXAM:
PORTABLE CHEST 1 VIEW

[chest]
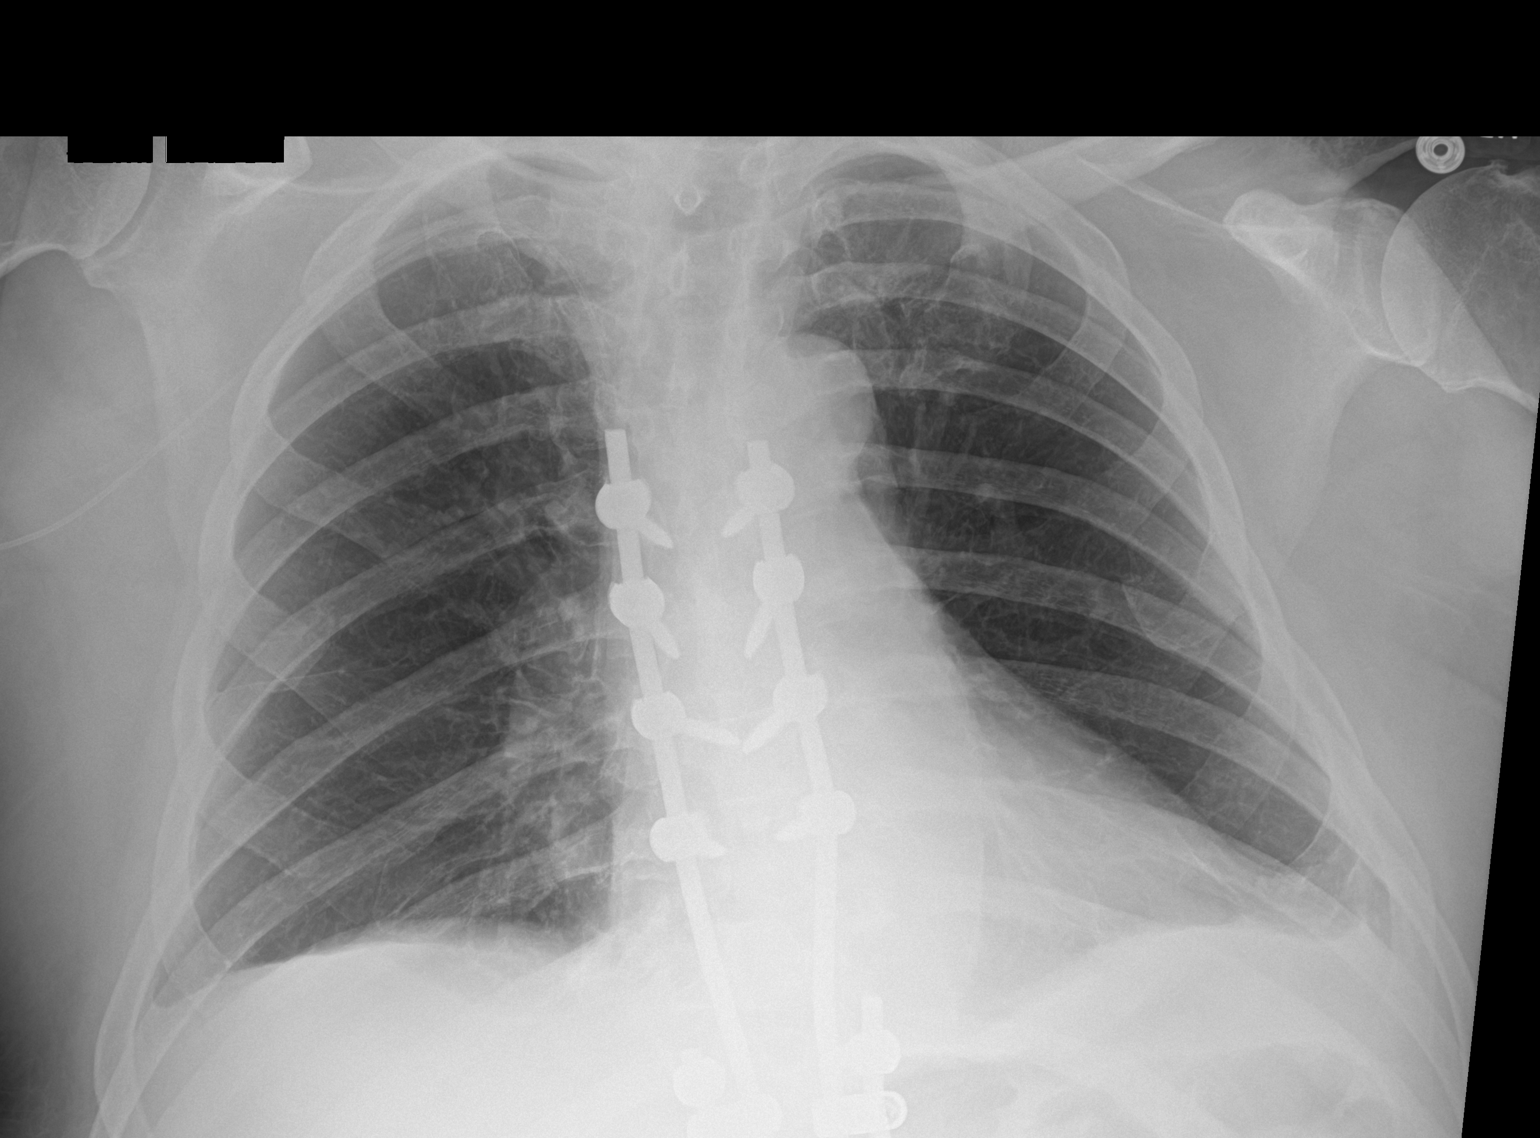

[1 of 1 positions shown; findings below may reference images not displayed]

FINDINGS: The cardiac silhouette, mediastinal and hilar contours are within
normal limits.

The right PICC line is partially obscured by the thoracic spine
hardware. Difficult to identify the distal tip. The last place I can
definitively see it is just above the right posterior rod which is
proximal to mid SVC.

Small bilateral pleural effusions and bibasilar scarring. No
infiltrates or edema.
IMPRESSION: Right PICC line tip is difficult to identify but likely in the
proximal to mid SVC.

## 2021-04-01 MED ORDER — ENOXAPARIN SODIUM 40 MG/0.4ML IJ SOSY
40.0000 mg | PREFILLED_SYRINGE | INTRAMUSCULAR | Status: DC
  Administered 2021-04-01 – 2021-04-02 (×2): 40 mg via SUBCUTANEOUS
  Filled 2021-04-01 (×2): qty 0.4

## 2021-04-01 MED ORDER — ALTEPLASE 2 MG IJ SOLR
2.0000 mg | Freq: Once | INTRAMUSCULAR | Status: AC
  Administered 2021-04-01: 2 mg
  Filled 2021-04-01: qty 2

## 2021-04-01 MED ORDER — DEXTROSE-NACL 5-0.45 % IV SOLN: INTRAVENOUS | Status: DC

## 2021-04-01 MED ORDER — PANTOPRAZOLE SODIUM 40 MG PO TBEC
40.0000 mg | DELAYED_RELEASE_TABLET | Freq: Every day | ORAL | Status: DC
  Administered 2021-04-01 – 2021-04-02 (×2): 40 mg via ORAL
  Filled 2021-04-01 (×2): qty 1

## 2021-04-01 NOTE — Evaluation (Signed)
Occupational Therapy Evaluation Patient Details Name: Marco Kennedy MRN: [id number] DOB: [birth year] Today's Date: 04/01/2021   History of Present Illness Pt is a 70 y.o. 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 He is now s/p T9-T10 laminectomy for decompression of thecal sac and debridement of abscess 9/18. Drain placed in left retroperitoneal space on 03/17/19 on bulb suction. Pt underwent T6-T11 PLIF on 03/31/2021. Pt had back sx  01/2020 with Dr. Venetia Maxon; admission 7/16-22 for sepsis and again 7/26 for SOB and fever. PMHx: back sx, arthritis, degenerative lumbar spinal stenosis, THA 2017.   Clinical Impression   Efrem was evaluated s/p the above back sx, prior to the sx and the above admission pt  required some assist with ADLs and ambulated household distances only. Upon this evaluation pt demonstrated improved tolerance for transitional movements with mod-max A +2. He sat EOB with BUE supported for ~10 minutes and TLSO donned, and completed 2x sit<>stand with max A +2. Pt required bilat knee blocking in standing with at least 2 buckles of R knee. Pt is performing ADLs with min-total A +2. He benefits from continued OT acutely. Recommend CIR to progress independence prior to d./c home.      Recommendations for follow up therapy are one component of a multi-disciplinary discharge planning process, led by the attending physician.  Recommendations may be updated based on patient status, additional functional criteria and insurance authorization.   Follow Up Recommendations  CIR    Equipment Recommendations  None recommended by OT    Recommendations for Other Services Rehab consult     Precautions / Restrictions Precautions Precautions: Fall;Back Precaution Booklet Issued: No Precaution Comments: pt able to verbalize 3/3 back precautions, requires cues for implementation during session Required Braces or Orthoses: Spinal  Brace Spinal Brace: Thoracolumbosacral orthotic;Applied in sitting position Spinal Brace Comments: TLSO when OOB only Restrictions Weight Bearing Restrictions: No      Mobility Bed Mobility Overal bed mobility: Needs Assistance Bed Mobility: Rolling;Sidelying to Sit;Sit to Sidelying Rolling: Mod assist Sidelying to sit: Max assist;+2 for physical assistance     Sit to sidelying: Mod assist;+2 for physical assistance General bed mobility comments: verbal cues to implament back precautions    Transfers Overall transfer level: Needs assistance Equipment used: 2 person hand held assist Transfers: Sit to/from Stand;Lateral/Scoot Transfers Sit to Stand: From elevated surface;Max assist;+2 physical assistance        Lateral/Scoot Transfers: Max assist;+2 physical assistance General transfer comment: PT/OT assist with bilateral knee block and BUE support. Pt with knee buckling with any attempts at LE movement in standing. Pt does demonstrate ability to activate quads and hip extensors when verbally cues for posture.    Balance Overall balance assessment: Needs assistance Sitting-balance support: Feet supported;Single extremity supported;Bilateral upper extremity supported Sitting balance-Leahy Scale: Poor Sitting balance - Comments: reliant on UE support of bed   Standing balance support: Bilateral upper extremity supported Standing balance-Leahy Scale: Zero Standing balance comment: maxA x 2 with BUE support and bilateral knee block                           ADL either performed or assessed with clinical judgement   ADL Overall ADL's : Needs assistance/impaired Eating/Feeding: Set up;Bed level   Grooming: Set up;Sitting   Upper Body Bathing: Minimal assistance;Sitting Upper Body Bathing Details (indicate cue type and reason): incrased time and min A for trunk  support Lower Body Bathing: Maximal assistance;+2 for physical assistance;+2 for safety/equipment;Sit  to/from stand   Upper Body Dressing : Moderate assistance;Sitting Upper Body Dressing Details (indicate cue type and reason): Mod A for tunk support in sitting, significant incrsaed time for pain management, total A for TLSO in sitting Lower Body Dressing: Total assistance;+2 for safety/equipment;+2 for physical assistance;Bed level;Sit to/from stand   Toilet Transfer: Maximal assistance;+2 for physical assistance;+2 for safety/equipment;Squat-pivot   Toileting- Clothing Manipulation and Hygiene: +2 for physical assistance;+2 for safety/equipment;Maximal assistance;Sit to/from stand       Functional mobility during ADLs: Maximal assistance;+2 for physical assistance;+2 for safety/equipment General ADL Comments: Pt is limited by pain, anxiety and BLE numbness/weakness     Vision Baseline Vision/History: 1 Wears glasses Ability to See in Adequate Light: 0 Adequate Patient Visual Report: No change from baseline Vision Assessment?: No apparent visual deficits     Perception     Praxis      Pertinent Vitals/Pain Pain Assessment: Faces Faces Pain Scale: Hurts even more Pain Location: back Pain Descriptors / Indicators: Grimacing Pain Intervention(s): Premedicated before session;Monitored during session     Hand Dominance Right   Extremity/Trunk Assessment Upper Extremity Assessment Upper Extremity Assessment: Overall WFL for tasks assessed (slight tremor noted after transitioning to sitting EOB; likely pain related)   Lower Extremity Assessment Lower Extremity Assessment: Defer to PT evaluation   Cervical / Trunk Assessment Cervical / Trunk Assessment: Other exceptions Cervical / Trunk Exceptions: s/p back sx and drain placement   Communication Communication Communication: No difficulties   Cognition Arousal/Alertness: Awake/alert Behavior During Therapy: WFL for tasks assessed/performed;Anxious Overall Cognitive Status: Within Functional Limits for tasks assessed                                  General Comments: Pt anxious for mobility due to pain and BLE numbness; however willing to participate in all tasks asked of him   General Comments  VSS on RA, wife present and supportive, dressing and drain intact    Exercises Exercises: General Lower Extremity General Exercises - Lower Extremity Ankle Circles/Pumps: AROM;Both;10 reps Quad Sets: AROM;Both;5 reps Gluteal Sets: AROM;Both;5 reps   Shoulder Instructions      Home Living Family/patient expects to be discharged to:: Private residence Living Arrangements: Spouse/significant other Available Help at Discharge: Available 24 hours/day Type of Home: House Home Access: Stairs to enter [employer] of Steps: approximately 14 total (6-7 steps followed by a walkway then 6-7 additional steps) Entrance Stairs-Rails: Right;Can reach both Home Layout: Two level;Able to live on main level with bedroom/bathroom Alternate Level Stairs-Number of Steps: flight Alternate Level Stairs-Rails: Right Bathroom Shower/Tub: Tub/shower unit   Bathroom Toilet: Handicapped height Bathroom Accessibility: Yes How Accessible: Accessible via walker Home Equipment: Shower seat;Bedside commode;Walker - 2 wheels;Cane - single point;Walker - 4 wheels;Other (comment);Toilet riser;Wheelchair - manual   Additional Comments: pt's wife leaves the home to work sometimes, his nephew stays with him while she is out      Prior Functioning/Environment Level of Independence: Needs assistance  Gait / Transfers Assistance Needed: Was able to walk 50-100 ft without AD, used cane for longer distances, but uses RW on uneven surfaces. Pt was working with PT to incrase distances and decrease fall risk ADL's / Homemaking Assistance Needed: Wife assists in dressing and bathing bottom half. Wife does cooking and cleaning. Pt manages own meds and finances. 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        OT Problem List: Decreased strength;Decreased range of motion;Decreased activity tolerance;Impaired balance (sitting and/or standing);Decreased safety awareness;Decreased knowledge of use of DME or AE;Decreased knowledge of precautions;Pain      OT Treatment/Interventions: Self-care/ADL training;Therapeutic exercise;DME and/or AE instruction;Therapeutic activities;Patient/family education;Balance training    OT Goals(Current goals can be found in the care plan section) Acute Rehab OT Goals Patient Stated Goal: less pain OT Goal Formulation: With patient Time For Goal Achievement: 04/15/21 Potential to Achieve Goals: Fair ADL Goals Pt Will Perform Grooming: with supervision;standing Pt Will Perform Lower Body Bathing: with min assist;sit to/from stand Pt Will Perform Lower Body Dressing: with min assist;with adaptive equipment;sit to/from stand Pt Will Transfer to Toilet: with min guard assist;ambulating Pt Will Perform Toileting - Clothing Manipulation and hygiene: with modified independence;sitting/lateral leans  OT Frequency: Min 2X/week   Barriers to D/C:            Co-evaluation PT/OT/SLP Co-Evaluation/Treatment: Yes Reason for Co-Treatment: Complexity of the patient's impairments (multi-system involvement);To address functional/ADL transfers PT goals addressed during session: Mobility/safety with mobility;Balance;Strengthening/ROM OT goals addressed during session: ADL's and self-care;Strengthening/ROM      AM-PAC OT "6 Clicks" Daily Activity     Outcome Measure Help from another person eating meals?: None Help from another person taking care of personal grooming?: A Little Help from another person toileting, which includes using toliet, bedpan, or urinal?: A Lot Help from another person bathing (including washing, rinsing, drying)?: A Lot Help from another person to put on and taking off regular upper body clothing?: A  Lot Help from another person to put on and taking off regular lower body clothing?: Total 6 Click Score: 14   End of Session Equipment Utilized During Treatment: Back brace Nurse Communication: Mobility status  Activity Tolerance: Patient limited by pain;Patient tolerated treatment well Patient left: in bed;with call bell/phone within reach;with family/visitor present  OT Visit Diagnosis: Unsteadiness on feet (R26.81);Other abnormalities of gait and mobility (R26.89);Muscle weakness (generalized) (M62.81);Repeated falls (R29.6);Pain                Time: 0539-7673 OT Time Calculation (min): 39 min Charges:  OT General Charges $OT Visit: 1 Visit OT Evaluation $OT Eval Moderate Complexity: 1 Mod OT Treatments $Therapeutic Activity: 8-22 mins   Cap Massi A Maram Bently 04/01/2021, 12:06 PM

## 2021-04-01 NOTE — Plan of Care (Signed)
Output charted for hemovac and foley taken out this morning. DTV by 12pm. IV pain med used when pt arrived and PO prn pain meds used for the rest of the night.  Problem: Education: Goal: Knowledge of General Education information will improve Description: Including pain rating scale, medication(s)/side effects and non-pharmacologic comfort measures Outcome: Progressing   Problem: Health Behavior/Discharge Planning: Goal: Ability to manage health-related needs will improve Outcome: Progressing   Problem: Clinical Measurements: Goal: Ability to maintain clinical measurements within normal limits will improve Outcome: Progressing Goal: Will remain free from infection Outcome: Progressing Goal: Diagnostic test results will improve Outcome: Progressing Goal: Respiratory complications will improve Outcome: Progressing Goal: Cardiovascular complication will be avoided Outcome: Progressing   Problem: Activity: Goal: Risk for activity intolerance will decrease Outcome: Progressing   Problem: Nutrition: Goal: Adequate nutrition will be maintained Outcome: Progressing   Problem: Coping: Goal: Level of anxiety will decrease Outcome: Progressing   Problem: Elimination: Goal: Will not experience complications related to bowel motility Outcome: Progressing Goal: Will not experience complications related to urinary retention Outcome: Progressing   Problem: Pain Managment: Goal: General experience of comfort will improve Outcome: Progressing   Problem: Safety: Goal: Ability to remain free from injury will improve Outcome: Progressing   Problem: Skin Integrity: Goal: Risk for impaired skin integrity will decrease Outcome: Progressing   Problem: Education: Goal: Ability to verbalize activity precautions or restrictions will improve Outcome: Progressing Goal: Knowledge of the prescribed therapeutic regimen will improve Outcome: Progressing Goal: Understanding of discharge needs  will improve Outcome: Progressing   Problem: Activity: Goal: Ability to avoid complications of mobility impairment will improve Outcome: Progressing Goal: Ability to tolerate increased activity will improve Outcome: Progressing Goal: Will remain free from falls Outcome: Progressing   Problem: Bowel/Gastric: Goal: Gastrointestinal status for postoperative course will improve Outcome: Progressing   Problem: Clinical Measurements: Goal: Ability to maintain clinical measurements within normal limits will improve Outcome: Progressing Goal: Postoperative complications will be avoided or minimized Outcome: Progressing Goal: Diagnostic test results will improve Outcome: Progressing   Problem: Pain Management: Goal: Pain level will decrease Outcome: Progressing   Problem: Skin Integrity: Goal: Will show signs of wound healing Outcome: Progressing   Problem: Health Behavior/Discharge Planning: Goal: Identification of resources available to assist in meeting health care needs will improve Outcome: Progressing   Problem: Bladder/Genitourinary: Goal: Urinary functional status for postoperative course will improve Outcome: Progressing

## 2021-04-01 NOTE — Progress Notes (Signed)
? ?  Inpatient Rehab Admissions Coordinator : ? ?Per therapy recommendations, patient was screened for CIR candidacy by Tarina Volk RN MSN.  At this time patient appears to be a potential candidate for CIR. I will place a rehab consult per protocol for full assessment. Please call me with any questions. ? ?Pacey Willadsen RN MSN ?Admissions Coordinator ?[phone] ?  ?

## 2021-04-01 NOTE — Progress Notes (Signed)
HD#17 SUBJECTIVE:  Patient Summary:  No acute overnight events. The patient was seen at bedside with his wife present.  He states that he is in a lot of pain from his surgery yesterday. He does endorse an episode of burning with peeing, however he is able to to urinate without issue otherwise. There are no other complaints or concerns at this time.  Denies fevers, worsening numbness in his bilateral lower extremities.  OBJECTIVE:  Vital Signs: Vitals:   03/31/21 2353 04/01/21 0341 04/01/21 0814 04/01/21 1216  BP: 135/85 140/77 (!) 142/79 135/84  Pulse: (!) 101 95 96 (!) 109  Resp: 18 17 18 18   Temp: 97.8 F (36.6 C) 98 F (36.7 C) 97.9 F (36.6 C) 98.1 F (36.7 C)  TempSrc: Oral Oral Oral   SpO2: 93% 97% 98% 98%  Weight:      Height:       Supplemental O2: Room Air SpO2: 98 % O2 Flow Rate (L/min): 2 L/min  Filed Weights   03/15/21 0014  Weight: 106.1 kg    Intake/Output Summary (Last 24 hours) at 04/01/2021 1539 Last data filed at 04/01/2021 1100 Gross per 24 hour  Intake 1300 ml  Output 1720 ml  Net -420 ml    Net IO Since Admission: -6,304.85 mL [04/01/21 1539]   Physical Exam: Constitutional: Elderly gentleman lying in bed.  No acute distress noted. Cardio: Regular rhythm.  No murmurs, rubs, gallops. Pulm: Clear to auscultation bilaterally. Normal work of breathing on room air. Abdomen: Soft, nontender, nondistended. MSK: Negative for bilateral upper and lower extremity edema. Skin: Skin is warm and dry. Neurologic exam: Mental status: A&Ox3. Sensory: Decreased sensation to light touch in bilateral lower extremities, similar to exam yesterday.  Psychiatric: Normal mood and affect   Patient Lines/Drains/Airways Status     Active Line/Drains/Airways     Name Placement date Placement time Site Days   Peripheral IV 03/14/21 20 G 1.88" Anterior;Left;Proximal Forearm 03/14/21  1549  Forearm  13   PICC Single Lumen 01/15/21 Right Basilic 39 cm 0 cm 01/15/21   1510  Basilic  71   Closed System Drain 1 Left Flank Bulb (JP) 12 Fr. 01/27/21  1407  Flank  59   Closed System Drain Lateral LLQ Bulb (JP) 10 Fr. 03/16/21  1335  LLQ  11   External Urinary Catheter 03/24/21  1440  --  3   Incision (Closed) 04/18/20 Flank Left 04/18/20  1419  -- 343   Incision (Closed) 01/13/21 Back 01/13/21  1016  -- 73   Incision (Closed) 03/14/21 Back 03/14/21  2006  -- 13   Wound / Incision (Open or Dehisced) 01/29/21 Non-pressure wound;Other (Comment) Buttocks Left 01/29/21  0900  Buttocks  57             ASSESSMENT/PLAN:  Assessment: Active Problems:   Vertebral osteomyelitis (HCC)   Empyema (HCC)   Sepsis due to Escherichia coli (E. coli) (HCC)   Retroperitoneal fluid collection   Constipation   Epidural abscess   Discitis of thoracic region  Plan: #Epidural abscess #Retroperitoneal fluid collection Patient is status post emergent decompression at T10 on 9/17, most recently status post thoracic stabilization surgery yesterday, 10/4.  He has been doing well postoperatively and appears to be a candidate for CIR. -Neurosurgery on board, appreciate their recommendations. -Patient is on bed rest with exception of mobilization to bedside commode. Continue to work with PT in bed. -TSLO brace when out of bed and as needed -ID on  board, appreciate their recommendations             -Continue cefazolin 2 g IV every 8 hours -Trend inflammatory markers every 48 hours  #Constipation Patient is on the bowel regimen as below.  He endorses having adequate bowel movements when his suppositories are given. -Continue bowel regimen of Dulcolax suppository 10 mg rectal daily, lactulose enema 300 mL rectal daily as needed, lubiprostone 24 mcg oral twice daily, MiraLAX daily, senna-docusate 2 tablets oral BID for bowel regimen   #Hypertension Chronically managed. BP of 135/84 today. -Continue amlodipine 5 mg daily  Best Practice: Diet: Regular diet IVF: Fluids:  None VTE: SCD's Start: 03/31/21 2231 SCDs Start: 03/14/21 1708 Code: Full AB: Cefazolin 2 g IV 3 times daily DISPO: Continue to monitor inpatient.  Signature: Fredonia Highland, MD Internal Medicine Resident, PGY-1 [facility]  Pager: [phone] 3:39 PM, 04/01/2021   Please contact the on call pager after 5 pm and on weekends at [phone].

## 2021-04-01 NOTE — Progress Notes (Signed)
RT note. Patient cpap all set up for the night, patient stated he can place self on cpap when ready for bed. Told him to let RN know if he needs any help. RT will continue to monitor,

## 2021-04-01 NOTE — Progress Notes (Signed)
Physical Therapy Treatment Patient Details Name: Marco Kennedy MRN: [id number] DOB: 04/13/1951 Today's Date: 04/01/2021   History of Present Illness Pt is a 70 y.o. 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 He is now s/p T9-T10 laminectomy for decompression of thecal sac and debridement of abscess 9/18. Drain placed in left retroperitoneal space on 03/17/19 on bulb suction. Pt underwent T6-T11 PLIF on 03/31/2021. Pt had back sx  01/2020 with Dr. Venetia Maxon; admission 7/16-22 for sepsis and again 7/26 for SOB and fever. PMHx: back sx, arthritis, degenerative lumbar spinal stenosis, THA 2017.    PT Comments    Pt tolerates treatment well but does continue to report significant pain. Pt with BLE numbness and significant LE weakness, resulting in significant physical assistance requirements to stand and in an inability to ambulate. Pt tolerates multiple transfer attempts and may benefit from transfer training with STEDY next session to aide in a progression to mobilizing out of bed. PT continues to recommend CIR placement at this time.   Recommendations for follow up therapy are one component of a multi-disciplinary discharge planning process, led by the attending physician.  Recommendations may be updated based on patient status, additional functional criteria and insurance authorization.  Follow Up Recommendations  CIR;Supervision/Assistance - 24 hour     Equipment Recommendations  Wheelchair (measurements PT);Wheelchair cushion (measurements PT);Hospital bed;Other (comment) (hoyer lift, all if D/C home)    Recommendations for Other Services       Precautions / Restrictions Precautions Precautions: Fall;Back Precaution Booklet Issued: No Precaution Comments: pt able to verbalize 3/3 back precautions, requires cues for implementation during session Required Braces or Orthoses: Spinal Brace Spinal Brace: Thoracolumbosacral  orthotic;Applied in sitting position Restrictions Weight Bearing Restrictions: No     Mobility  Bed Mobility Overal bed mobility: Needs Assistance Bed Mobility: Rolling;Sidelying to Sit;Sit to Sidelying Rolling: Mod assist Sidelying to sit: Max assist;+2 for physical assistance     Sit to sidelying: Mod assist;+2 for physical assistance      Transfers Overall transfer level: Needs assistance Equipment used: 2 person hand held assist Transfers: Sit to/from Stand;Lateral/Scoot Transfers Sit to Stand: From elevated surface;Max assist;+2 physical assistance        Lateral/Scoot Transfers: Max assist;+2 physical assistance General transfer comment: PT/OT assist with bilateral knee block and BUE support. Pt with knee buckling with any attempts at LE movement in standing. Pt does demonstrate ability to activate quads and hip extensors when verbally cues for posture.  Ambulation/Gait Ambulation/Gait assistance:  (unable to clear foot due to LE weakness and high falls risk)               Stairs             Wheelchair Mobility    Modified Rankin (Stroke Patients Only)       Balance Overall balance assessment: Needs assistance Sitting-balance support: Feet supported;Single extremity supported;Bilateral upper extremity supported Sitting balance-Leahy Scale: Poor Sitting balance - Comments: reliant on UE support of bed   Standing balance support: Bilateral upper extremity supported Standing balance-Leahy Scale: Zero Standing balance comment: maxA x 2 with BUE support and bilateral knee block                            Cognition Arousal/Alertness: Awake/alert Behavior During Therapy: WFL for tasks assessed/performed;Anxious Overall Cognitive Status: Within Functional Limits for tasks assessed  Exercises General Exercises - Lower Extremity Ankle Circles/Pumps: AROM;Both;10 reps Quad Sets:  AROM;Both;5 reps Gluteal Sets: AROM;Both;5 reps    General Comments General comments (skin integrity, edema, etc.): VSS on RA, honeycomb dressing intact      Pertinent Vitals/Pain Pain Assessment: Faces Faces Pain Scale: Hurts even more Pain Location: back Pain Descriptors / Indicators: Grimacing Pain Intervention(s): Premedicated before session    Home Living                      Prior Function            PT Goals (current goals can now be found in the care plan section) Acute Rehab PT Goals Patient Stated Goal: less pain PT Goal Formulation: With patient Time For Goal Achievement: 04/15/21 Potential to Achieve Goals: Fair Progress towards PT goals: Progressing toward goals (goals updated based on current functional level)    Frequency    Min 3X/week      PT Plan Current plan remains appropriate    Co-evaluation PT/OT/SLP Co-Evaluation/Treatment: Yes Reason for Co-Treatment: Complexity of the patient's impairments (multi-system involvement);For patient/therapist safety;To address functional/ADL transfers PT goals addressed during session: Mobility/safety with mobility;Balance;Strengthening/ROM        AM-PAC PT "6 Clicks" Mobility   Outcome Measure  Help needed turning from your back to your side while in a flat bed without using bedrails?: A Lot Help needed moving from lying on your back to sitting on the side of a flat bed without using bedrails?: Total Help needed moving to and from a bed to a chair (including a wheelchair)?: Total Help needed standing up from a chair using your arms (e.g., wheelchair or bedside chair)?: Total Help needed to walk in hospital room?: Total Help needed climbing 3-5 steps with a railing? : Total 6 Click Score: 7    End of Session Equipment Utilized During Treatment: Back brace Activity Tolerance: Patient tolerated treatment well Patient left: in bed;with call bell/phone within reach;with bed alarm set;with  family/visitor present Nurse Communication: Mobility status PT Visit Diagnosis: Muscle weakness (generalized) (M62.81);Difficulty in walking, not elsewhere classified (R26.2) Pain - part of body:  (back)     Time: 6720-9470 PT Time Calculation (min) (ACUTE ONLY): 38 min  Charges:  $Therapeutic Activity: 23-37 mins                     Arlyss Gandy, PT, DPT Acute Rehabilitation Pager: [phone]    Arlyss Gandy 04/01/2021, 11:44 AM

## 2021-04-01 NOTE — Progress Notes (Signed)
Subjective: Patient reports that he is doing well following his surgery. He stated that his low back pain had moved up higher in his back. No acute events overnight.   Objective: Vital signs in last 24 hours: Temp:  [97.5 F (36.4 C)-98.4 F (36.9 C)] 98 F (36.7 C) (10/05 0341) Pulse Rate:  [69-101] 95 (10/05 0341) Resp:  [10-19] 17 (10/05 0341) BP: (133-163)/(77-93) 140/77 (10/05 0341) SpO2:  [93 %-100 %] 97 % (10/05 0341) Arterial Line BP: (167-180)/(76-88) 167/81 (10/04 2000)  Intake/Output from previous day: 10/04 0701 - 10/05 0700 In: 1400 [I.V.:1300; IV Piggyback:100] Out: 1640 [Urine:1225; Drains:215; Blood:200] Intake/Output this shift: No intake/output data recorded.  Physical Exam: Patient is awake, A/O X 4, and conversant. NAD. VSS. Speech fluent and appropriate. MAEW with good strength. PERLA, EOMI. CNs grossly intact. Dressing is CDI. Incision is well approximated with no drainage, erythema, or edema. Drain with approximately 215 ml of output overnight.   Lab Results: Recent Labs    03/31/21 0600 04/01/21 0645  WBC 7.3 16.3*  HGB 10.3* 9.9*  HCT 33.3* 31.4*  PLT 424* 458*   BMET Recent Labs    03/31/21 0600 04/01/21 0645  NA 135 132*  K 4.1 5.2*  CL 99 98  CO2 28 25  GLUCOSE 103* 155*  BUN 13 14  CREATININE 0.51* 0.70  CALCIUM 8.9 8.6*    Studies/Results: DG Thoracic Spine 2 View  Result Date: 03/31/2021 CLINICAL DATA:  Posterior decompression fusion EXAM: THORACIC SPINE 2 VIEWS COMPARISON:  MRI 03/21/2021 FINDINGS: Two low resolution intraoperative spot views of the thoracic spine. Total fluoroscopy time was 36 seconds. The images demonstrate surgical instruments and fixating screws at the thoracic region. IMPRESSION: Intraoperative fluoroscopic assistance provided during thoracic spine surgery Electronically Signed   By: Jasmine Pang M.D.   On: 03/31/2021 19:44   DG C-Arm 1-60 Min-No Report  Result Date: 03/31/2021 Fluoroscopy was utilized by  the requesting physician.  No radiographic interpretation.    Assessment/Plan: Patient is post-op day 1 s/p T6-T9  Posterior decompression/Fusion with removal hardware T10 and extension of fusion to T11, decompression of spinal cord dura T 9 - T 11 with laminectomy, posterolateral arthrodesis T 6 - T 11 levels. The patient is recovering well. He remains at his neurological baseline. No new complaints of leg pain, numbness, tingling, or weakness. He has incisional pain that appears to be appropriate. He has had a decrease in his low back pain. He is awaiting postsurgical PT/OT evaluation. Continue working on pain control, mobility and ambulating patient.     LOS: 17 days     Council Mechanic, DNP, NP-C 04/01/2021, 8:08 AM

## 2021-04-01 NOTE — Plan of Care (Signed)
  Problem: Education: Goal: Knowledge of General Education information will improve Description: Including pain rating scale, medication(s)/side effects and non-pharmacologic comfort measures 04/01/2021 0803 by Drue Dun, RN Outcome: Progressing 04/01/2021 0803 by Drue Dun, RN Outcome: Progressing   Problem: Health Behavior/Discharge Planning: Goal: Ability to manage health-related needs will improve 04/01/2021 0803 by Drue Dun, RN Outcome: Progressing 04/01/2021 0803 by Drue Dun, RN Outcome: Progressing   Problem: Clinical Measurements: Goal: Ability to maintain clinical measurements within normal limits will improve 04/01/2021 0803 by Drue Dun, RN Outcome: Progressing 04/01/2021 0803 by Drue Dun, RN Outcome: Progressing Goal: Will remain free from infection 04/01/2021 0803 by Drue Dun, RN Outcome: Progressing 04/01/2021 0803 by Drue Dun, RN Outcome: Progressing Goal: Diagnostic test results will improve 04/01/2021 0803 by Drue Dun, RN Outcome: Progressing 04/01/2021 0803 by Drue Dun, RN Outcome: Progressing Goal: Respiratory complications will improve 04/01/2021 0803 by Drue Dun, RN Outcome: Progressing 04/01/2021 0803 by Drue Dun, RN Outcome: Progressing Goal: Cardiovascular complication will be avoided 04/01/2021 0803 by Drue Dun, RN Outcome: Progressing 04/01/2021 0803 by Drue Dun, RN Outcome: Progressing   Problem: Activity: Goal: Risk for activity intolerance will decrease 04/01/2021 0803 by Drue Dun, RN Outcome: Progressing 04/01/2021 0803 by Drue Dun, RN Outcome: Progressing   Problem: Nutrition: Goal: Adequate nutrition will be maintained 04/01/2021 0803 by Drue Dun, RN Outcome: Progressing 04/01/2021 0803 by Drue Dun, RN Outcome: Progressing   Problem: Coping: Goal: Level of anxiety will decrease 04/01/2021 0803 by Drue Dun, RN Outcome: Progressing 04/01/2021 0803 by Drue Dun, RN Outcome: Progressing   Problem: Elimination: Goal: Will not experience complications related to bowel motility Outcome: Progressing Goal: Will not experience complications related to urinary retention Outcome: Progressing   Problem: Pain Managment: Goal: General experience of comfort will improve Outcome: Progressing   Problem: Safety: Goal: Ability to remain free from injury will improve Outcome: Progressing   Problem: Skin Integrity: Goal: Risk for impaired skin integrity will decrease Outcome: Progressing   Problem: Education: Goal: Ability to verbalize activity precautions or restrictions will improve Outcome: Progressing Goal: Knowledge of the prescribed therapeutic regimen will improve Outcome: Progressing Goal: Understanding of discharge needs will improve Outcome: Progressing   Problem: Activity: Goal: Ability to avoid complications of mobility impairment will improve Outcome: Progressing Goal: Ability to tolerate increased activity will improve Outcome: Progressing Goal: Will remain free from falls Outcome: Progressing   Problem: Bowel/Gastric: Goal: Gastrointestinal status for postoperative course will improve Outcome: Progressing   Problem: Clinical Measurements: Goal: Ability to maintain clinical measurements within normal limits will improve Outcome: Progressing Goal: Postoperative complications will be avoided or minimized Outcome: Progressing Goal: Diagnostic test results will improve Outcome: Progressing   Problem: Pain Management: Goal: Pain level will decrease Outcome: Progressing   Problem: Skin Integrity: Goal: Will show signs of wound healing Outcome: Progressing   Problem: Health Behavior/Discharge Planning: Goal: Identification of resources available to assist in meeting health care needs will improve Outcome: Progressing   Problem:  Bladder/Genitourinary: Goal: Urinary functional status for postoperative course will improve Outcome: Progressing

## 2021-04-01 NOTE — Progress Notes (Signed)
Inpatient Rehab Admissions Coordinator:   Met with patient and his spouse at the bedside to discuss return to CIR (previously admitting July/Aug of this year).  They are both hopeful for return to maximize independence at home.  They recall expectations/goals of CIR and [name] can still provide assist at d/c if needed.  I will start insurance authorization request today.   Shann Medal, PT, DPT Admissions Coordinator [phone] 04/01/21  2:20 PM

## 2021-04-02 DIAGNOSIS — A419 Sepsis, unspecified organism: Secondary | ICD-10-CM

## 2021-04-02 LAB — BASIC METABOLIC PANEL
Anion gap: 7 (ref 5–15)
BUN: 16 mg/dL (ref 8–23)
CO2: 26 mmol/L (ref 22–32)
Calcium: 8.4 mg/dL — ABNORMAL LOW (ref 8.9–10.3)
Chloride: 98 mmol/L (ref 98–111)
Creatinine, Ser: 0.65 mg/dL (ref 0.61–1.24)
GFR, Estimated: >60 mL/min (ref >60)
Glucose, Bld: 140 mg/dL — ABNORMAL HIGH (ref 70–99)
Potassium: 4.6 mmol/L (ref 3.5–5.1)
Sodium: 131 mmol/L — ABNORMAL LOW (ref 135–145)

## 2021-04-02 LAB — CBC
HCT: 26.9 % — ABNORMAL LOW (ref 39.0–52.0)
Hemoglobin: 8.4 g/dL — ABNORMAL LOW (ref 13.0–17.0)
MCH: 27.4 pg (ref 26.0–34.0)
MCHC: 31.2 g/dL (ref 30.0–36.0)
MCV: 87.6 fL (ref 80.0–100.0)
Platelets: 352 10*3/uL (ref 150–400)
RBC: 3.07 MIL/uL — ABNORMAL LOW (ref 4.22–5.81)
RDW: 14.7 % (ref 11.5–15.5)
WBC: 9.8 10*3/uL (ref 4.0–10.5)
nRBC: 0 % (ref 0.0–0.2)

## 2021-04-02 LAB — SEDIMENTATION RATE: Sed Rate: 30 mm/hr — ABNORMAL HIGH (ref 0–16)

## 2021-04-02 LAB — C-REACTIVE PROTEIN: CRP: 8.1 mg/dL — ABNORMAL HIGH (ref <1.0)

## 2021-04-02 MED ORDER — OXYCODONE HCL 5 MG PO TABS: 5.0000 mg | ORAL_TABLET | Freq: Once | ORAL | Status: AC

## 2021-04-02 MED ORDER — OXYCODONE HCL 5 MG PO TABS
5.0000 mg | ORAL_TABLET | ORAL | Status: DC | PRN
  Filled 2021-04-02: qty 1

## 2021-04-02 MED ORDER — AMOXICILLIN 500 MG PO CAPS
500.0000 mg | ORAL_CAPSULE | Freq: Three times a day (TID) | ORAL | Status: DC
  Administered 2021-04-02 – 2021-04-03 (×4): 500 mg via ORAL
  Filled 2021-04-02 (×4): qty 1

## 2021-04-02 MED ORDER — OXYCODONE HCL 5 MG PO TABS
10.0000 mg | ORAL_TABLET | ORAL | Status: DC | PRN
  Administered 2021-04-02 – 2021-04-03 (×4): 10 mg via ORAL
  Filled 2021-04-02 (×4): qty 2

## 2021-04-02 MED ORDER — HYDROMORPHONE HCL 1 MG/ML IJ SOLN
1.0000 mg | INTRAMUSCULAR | Status: DC | PRN
  Administered 2021-04-03: 1 mg via INTRAVENOUS
  Filled 2021-04-02: qty 1

## 2021-04-02 NOTE — Progress Notes (Signed)
HD#[id number] SUBJECTIVE:  Patient Summary:  No acute overnight events. The patient was seen at bedside rounds this morning.  He states that his pain levels have improved compared to yesterday.  He has not had any burning with urination since yesterday, he does not have any abdominal pain.  No other complaints or concerns today.  OBJECTIVE:  Vital Signs: Vitals:   04/01/21 2332 04/02/21 0325 04/02/21 0802 04/02/21 1136  BP: 123/71 126/68 125/74 127/72  Pulse: 91 82 79 92  Resp: 17 17 18 18   Temp: 98 F (36.7 C) 98.5 F (36.9 C) 98.6 F (37 C) 98.2 F (36.8 C)  TempSrc: Oral Oral Oral Oral  SpO2: 99% 98% 95% 96%  Weight:      Height:       Supplemental O2: Room Air SpO2: 96 % O2 Flow Rate (L/min): 2 L/min  Filed Weights   03/15/21 0014  Weight: 106.1 kg    Intake/Output Summary (Last 24 hours) at 04/02/2021 1333 Last data filed at 04/02/2021 0702 Gross per 24 hour  Intake 2092.5 ml  Output 1144 ml  Net 948.5 ml    Net IO Since Admission: -5,356.35 mL [04/02/21 1333]   Physical Exam: Constitutional: Elderly gentleman lying in bed.  No acute distress noted. Cardio: Regular rhythm. No murmurs, rubs, gallops. Pulm: Clear to auscultation bilaterally. Normal work of breathing on room air. Abdomen: Soft, nontender, nondistended. MSK: Negative for bilateral upper and lower extremity edema. Skin: Skin is warm and dry. Neurologic exam: Mental status: A&Ox3. No new focal deficits. Psychiatric: Normal mood and affect   Patient Lines/Drains/Airways Status     Active Line/Drains/Airways     Name Placement date Placement time Site Days   Peripheral IV 03/14/21 20 G 1.88" Anterior;Left;Proximal Forearm 03/14/21  1549  Forearm  13   PICC Single Lumen 01/15/21 Right Basilic 39 cm 0 cm 01/15/21  01/17/21  Basilic  71   Closed System Drain 1 Left Flank Bulb (JP) 12 Fr. 01/27/21  1407  Flank  59   Closed System Drain Lateral LLQ Bulb (JP) 10 Fr. 03/16/21  1335  LLQ  11   External  Urinary Catheter 03/24/21  1440  --  3   Incision (Closed) 04/18/20 Flank Left 04/18/20  1419  -- 343   Incision (Closed) 01/13/21 Back 01/13/21  1016  -- 73   Incision (Closed) 03/14/21 Back 03/14/21  2006  -- 13   Wound / Incision (Open or Dehisced) 01/29/21 Non-pressure wound;Other (Comment) Buttocks Left 01/29/21  0900  Buttocks  57             ASSESSMENT/PLAN:  Assessment: Active Problems:   Vertebral osteomyelitis (HCC)   Empyema (HCC)   Sepsis due to Escherichia coli (E. coli) (HCC)   Retroperitoneal fluid collection   Constipation   Epidural abscess   Discitis of thoracic region  Plan: #Epidural abscess #Retroperitoneal fluid collection Patient is status post emergent decompression at T10 on 9/17, most recently status post thoracic stabilization surgery on 10/4.  He has been doing well postoperatively and appears to be a candidate for CIR. -Neurosurgery on board, appreciate their recommendations. -Patient is on bed rest with exception of mobilization to bedside commode. Continue to work with PT in bed. -TSLO brace when out of bed and as needed -ID on board, appreciate their recommendations             -Continue amoxicillin per ID -Trend inflammatory markers every 48 hours  #Constipation Patient is on the bowel  regimen as below.  He endorses having adequate bowel movements when his suppositories are given. -Continue bowel regimen of Dulcolax suppository 10 mg rectal daily, lactulose enema 300 mL rectal daily as needed, lubiprostone 24 mcg oral twice daily, MiraLAX daily, senna-docusate 2 tablets oral BID for bowel regimen   #Hypertension Chronically managed. BP of 127/72 today. -Continue amlodipine 5 mg daily  Best Practice: Diet: Regular diet IVF: Fluids: None VTE: enoxaparin (LOVENOX) injection 40 mg Start: 04/01/21 2130 SCD's Start: 03/31/21 2231 SCDs Start: 03/14/21 1708 Code: Full AB: Amoxicillin per ID DISPO: Continue to monitor  inpatient.  Signature: Fredonia Highland, MD Internal Medicine Resident, PGY-1 [facility]  Pager: [phone] 1:33 PM, 04/02/2021   Please contact the on call pager after 5 pm and on weekends at [phone].

## 2021-04-02 NOTE — Progress Notes (Signed)
Subjective: Patient reports that he is making slow progress. Back pain is continuing to improve. He reports minimal change in his BLE paraesthesia at this time. No acute events overnight.   Objective: Vital signs in last 24 hours: Temp:  [97.8 F (36.6 C)-98.9 F (37.2 C)] 98.5 F (36.9 C) (10/06 0325) Pulse Rate:  [82-109] 82 (10/06 0325) Resp:  [17-19] 17 (10/06 0325) BP: (123-145)/(68-84) 126/68 (10/06 0325) SpO2:  [97 %-100 %] 98 % (10/06 0325)  Intake/Output from previous day: 10/05 0701 - 10/06 0700 In: 1922.5 [P.O.:360; I.V.:1412.5; IV Piggyback:150] Out: 1224 [Urine:979; Drains:245] Intake/Output this shift: Total I/O In: 170 [P.O.:120; I.V.:50] Out: -   Physical Exam: Patient is awake, A/O X 4, and conversant. NAD. VSS. Speech fluent and appropriate. MAEW with good strength. PERLA, EOMI. CNs grossly intact. Dressing is CDI. Incision is well approximated with no drainage, erythema, or edema. Drain with approximately 245 ml of output over last 24 hours.     Lab Results: Recent Labs    04/01/21 0645 04/02/21 0319  WBC 16.3* 9.8  HGB 9.9* 8.4*  HCT 31.4* 26.9*  PLT 458* 352   BMET Recent Labs    04/01/21 0645 04/02/21 0319  NA 132* 131*  K 5.2* 4.6  CL 98 98  CO2 25 26  GLUCOSE 155* 140*  BUN 14 16  CREATININE 0.70 0.65  CALCIUM 8.6* 8.4*    Studies/Results: DG Thoracic Spine 2 View  Result Date: 03/31/2021 CLINICAL DATA:  Posterior decompression fusion EXAM: THORACIC SPINE 2 VIEWS COMPARISON:  MRI 03/21/2021 FINDINGS: Two low resolution intraoperative spot views of the thoracic spine. Total fluoroscopy time was 36 seconds. The images demonstrate surgical instruments and fixating screws at the thoracic region. IMPRESSION: Intraoperative fluoroscopic assistance provided during thoracic spine surgery Electronically Signed   By: Jasmine Pang M.D.   On: 03/31/2021 19:44   DG CHEST PORT 1 VIEW  Result Date: 04/01/2021 CLINICAL DATA:  Evaluate PICC line  position. EXAM: PORTABLE CHEST 1 VIEW COMPARISON:  01/23/2021 FINDINGS: The cardiac silhouette, mediastinal and hilar contours are within normal limits. The right PICC line is partially obscured by the thoracic spine hardware. Difficult to identify the distal tip. The last place I can definitively see it is just above the right posterior rod which is proximal to mid SVC. Small bilateral pleural effusions and bibasilar scarring. No infiltrates or edema. IMPRESSION: Right PICC line tip is difficult to identify but likely in the proximal to mid SVC. Electronically Signed   By: Rudie Meyer M.D.   On: 04/01/2021 14:28   DG C-Arm 1-60 Min-No Report  Result Date: 03/31/2021 Fluoroscopy was utilized by the requesting physician.  No radiographic interpretation.    Assessment/Plan: Patient is post-op day 2 s/p T6-T9  posterior decompression/fusion with removal hardware T10 and extension of fusion to T11, decompression of spinal cord dura T 9 - T 11 with laminectomy, posterolateral arthrodesis T 6 - T 11 levels. The patient is continuing toe recover well. He remains neurologically intact. He continues to have some incisional pain that appears to be appropriate. Low back pain continues to improve. PT/OT are recommending CIR. CIR consult placed. Awaiting insurance approval and bed placement. Continue working on pain control, mobility and ambulating patient. Continue working with therapies. Will leave drain in place today.   LOS: 18 days     Council Mechanic, DNP, NP-C 04/02/2021, 7:58 AM

## 2021-04-02 NOTE — Progress Notes (Signed)
Inpatient Rehab Admissions Coordinator:   I do not have a bed for this patient to admit to CIR today. I did get insurance approval and hope to admit in the next 1-2 days pending bed availability.   Estill Dooms, PT, DPT Admissions Coordinator [phone] 04/02/21  10:51 AM

## 2021-04-02 NOTE — Progress Notes (Signed)
RT note. Patient stated he can place self on cpap machine when ready for bed tonight, RT will continue to monitor.

## 2021-04-02 NOTE — Progress Notes (Addendum)
[facility]    Date of Admission:  03/14/2021      ID: Marco Kennedy is a 70 y.o. male with  ecoli vertebral osteomyelitis. Active Problems:   Vertebral osteomyelitis (HCC)   Empyema (HCC)   Sepsis due to Escherichia coli (E. coli) (HCC)   Retroperitoneal fluid collection   Constipation   Epidural abscess   Discitis of thoracic region    Subjective: Having some back pain but improving. Being evaluated for CIR. Denies fevers.   OR cx no growth to date  Medications:   acetaminophen  650 mg Oral Q6H   amLODipine  5 mg Oral Daily   amoxicillin  500 mg Oral Q8H   Chlorhexidine Gluconate Cloth  6 each Topical Daily   cholecalciferol  1,000 Units Oral Daily   colchicine  0.6 mg Oral Daily   diclofenac  75 mg Oral BID   docusate sodium  100 mg Oral BID   enoxaparin (LOVENOX) injection  40 mg Subcutaneous Q24H   lubiprostone  24 mcg Oral BID WC   multivitamin with minerals  1 tablet Oral Daily   pantoprazole  40 mg Oral QHS   polyethylene glycol  17 g Oral Daily   senna-docusate  2 tablet Oral BID   sodium chloride flush  3 mL Intravenous Q12H   sodium chloride flush  5 mL Intracatheter Q8H    Objective: Vital signs in last 24 hours: Temp:  [98 F (36.7 C)-98.9 F (37.2 C)] 98.2 F (36.8 C) (10/06 1136) Pulse Rate:  [79-107] 92 (10/06 1136) Resp:  [17-19] 18 (10/06 1136) BP: (123-127)/(68-74) 127/72 (10/06 1136) SpO2:  [95 %-99 %] 96 % (10/06 1136)  Physical Exam  Constitutional: He is oriented to person, place, and time. He appears well-developed and well-nourished. No distress.  HENT:  Mouth/Throat: Oropharynx is clear and moist. No oropharyngeal exudate.  Cardiovascular: Normal rate, regular rhythm and normal heart sounds. Exam reveals no gallop and no friction rub.  No murmur heard.  Pulmonary/Chest: Effort normal and breath sounds normal. No respiratory distress. He has no wheezes.  Abdominal: Soft. Bowel sounds are normal. He  exhibits no distension. There is no tenderness.  Back: drain still in place. Lymphadenopathy:  He has no cervical adenopathy.  Neurological: He is alert and oriented to person, place, and time.  Skin: Skin is warm and dry. No rash noted. No erythema.  Psychiatric: He has a normal mood and affect. His behavior is normal.    Lab Results Recent Labs    04/01/21 0645 04/02/21 0319  WBC 16.3* 9.8  HGB 9.9* 8.4*  HCT 31.4* 26.9*  NA 132* 131*  K 5.2* 4.6  CL 98 98  CO2 25 26  BUN 14 16  CREATININE 0.70 0.65   Liver Panel No results for input(s): PROT, ALBUMIN, AST, ALT, ALKPHOS, BILITOT, BILIDIR, IBILI in the last 72 hours. Sedimentation Rate Recent Labs    04/02/21 0319  ESRSEDRATE 30*   C-Reactive Protein Recent Labs    04/02/21 0319  CRP 8.1*    Microbiology:  Studies/Results: DG Thoracic Spine 2 View  Result Date: 03/31/2021 CLINICAL DATA:  Posterior decompression fusion EXAM: THORACIC SPINE 2 VIEWS COMPARISON:  MRI 03/21/2021 FINDINGS: Two low resolution intraoperative spot views of the thoracic spine. Total fluoroscopy time was 36 seconds. The images demonstrate surgical instruments and fixating screws at the thoracic region. IMPRESSION: Intraoperative fluoroscopic assistance provided during thoracic spine surgery Electronically Signed   By: Jasmine Pang  M.D.   On: 03/31/2021 19:44   DG CHEST PORT 1 VIEW  Result Date: 04/01/2021 CLINICAL DATA:  Evaluate PICC line position. EXAM: PORTABLE CHEST 1 VIEW COMPARISON:  01/23/2021 FINDINGS: The cardiac silhouette, mediastinal and hilar contours are within normal limits. The right PICC line is partially obscured by the thoracic spine hardware. Difficult to identify the distal tip. The last place I can definitively see it is just above the right posterior rod which is proximal to mid SVC. Small bilateral pleural effusions and bibasilar scarring. No infiltrates or edema. IMPRESSION: Right PICC line tip is difficult to identify  but likely in the proximal to mid SVC. Electronically Signed   By: Rudie Meyer M.D.   On: 04/01/2021 14:28   DG C-Arm 1-60 Min-No Report  Result Date: 03/31/2021 Fluoroscopy was utilized by the requesting physician.  No radiographic interpretation.     Assessment/Plan: Ecoli vertebral /thoracic spineosteo/epidural abscess = has been on IV abtx since mid-late July. Now at 11-12 wk mark. Did develop left pelvic fluid collection on 9/17-9/24/22 imagining that has been drained- (cx negative)  Will plan to dispense 4 wk of amoxicillin 500mg  TID. Can remove picc line once ready to transition to CIR since no longer needs IV abtx  Will check sed rate and crp  He will follow up with Dr in 2-4 wk who will decide if needs this further.  [facility] Pager: [phone]  04/02/2021, 5:14 PM

## 2021-04-03 ENCOUNTER — Encounter (HOSPITAL_COMMUNITY): Payer: Self-pay | Admitting: Physical Medicine and Rehabilitation

## 2021-04-03 ENCOUNTER — Inpatient Hospital Stay (HOSPITAL_COMMUNITY)
Admission: RE | Admit: 2021-04-03 | Discharge: 2021-05-08 | DRG: 095 | Disposition: A | Discharge status: Home Health | Payer: BC Managed Care – PPO | Source: Intra-hospital | Attending: Physical Medicine and Rehabilitation | Admitting: Physical Medicine and Rehabilitation

## 2021-04-03 ENCOUNTER — Other Ambulatory Visit: Payer: Self-pay

## 2021-04-03 DIAGNOSIS — R200 Anesthesia of skin: Secondary | ICD-10-CM | POA: Diagnosis present

## 2021-04-03 DIAGNOSIS — F32A Depression, unspecified: Secondary | ICD-10-CM | POA: Diagnosis present

## 2021-04-03 DIAGNOSIS — R0989 Other specified symptoms and signs involving the circulatory and respiratory systems: Secondary | ICD-10-CM | POA: Diagnosis present

## 2021-04-03 DIAGNOSIS — L89321 Pressure ulcer of left buttock, stage 1: Secondary | ICD-10-CM | POA: Diagnosis not present

## 2021-04-03 DIAGNOSIS — L89151 Pressure ulcer of sacral region, stage 1: Secondary | ICD-10-CM | POA: Diagnosis not present

## 2021-04-03 DIAGNOSIS — I10 Essential (primary) hypertension: Secondary | ICD-10-CM | POA: Diagnosis present

## 2021-04-03 DIAGNOSIS — K589 Irritable bowel syndrome without diarrhea: Secondary | ICD-10-CM | POA: Diagnosis present

## 2021-04-03 DIAGNOSIS — G4733 Obstructive sleep apnea (adult) (pediatric): Secondary | ICD-10-CM | POA: Diagnosis present

## 2021-04-03 DIAGNOSIS — M62838 Other muscle spasm: Secondary | ICD-10-CM | POA: Diagnosis present

## 2021-04-03 DIAGNOSIS — K5903 Drug induced constipation: Secondary | ICD-10-CM | POA: Diagnosis not present

## 2021-04-03 DIAGNOSIS — E119 Type 2 diabetes mellitus without complications: Secondary | ICD-10-CM | POA: Diagnosis present

## 2021-04-03 DIAGNOSIS — R269 Unspecified abnormalities of gait and mobility: Secondary | ICD-10-CM | POA: Diagnosis present

## 2021-04-03 DIAGNOSIS — E871 Hypo-osmolality and hyponatremia: Secondary | ICD-10-CM | POA: Diagnosis present

## 2021-04-03 DIAGNOSIS — D62 Acute posthemorrhagic anemia: Secondary | ICD-10-CM | POA: Diagnosis present

## 2021-04-03 DIAGNOSIS — Z8261 Family history of arthritis: Secondary | ICD-10-CM

## 2021-04-03 DIAGNOSIS — G8918 Other acute postprocedural pain: Secondary | ICD-10-CM | POA: Diagnosis present

## 2021-04-03 DIAGNOSIS — E46 Unspecified protein-calorie malnutrition: Secondary | ICD-10-CM | POA: Diagnosis present

## 2021-04-03 DIAGNOSIS — M109 Gout, unspecified: Secondary | ICD-10-CM | POA: Diagnosis present

## 2021-04-03 DIAGNOSIS — E8809 Other disorders of plasma-protein metabolism, not elsewhere classified: Secondary | ICD-10-CM | POA: Diagnosis not present

## 2021-04-03 DIAGNOSIS — Z8249 Family history of ischemic heart disease and other diseases of the circulatory system: Secondary | ICD-10-CM

## 2021-04-03 DIAGNOSIS — G061 Intraspinal abscess and granuloma: Principal | ICD-10-CM | POA: Diagnosis present

## 2021-04-03 DIAGNOSIS — Z79899 Other long term (current) drug therapy: Secondary | ICD-10-CM | POA: Diagnosis not present

## 2021-04-03 DIAGNOSIS — M170 Bilateral primary osteoarthritis of knee: Secondary | ICD-10-CM | POA: Diagnosis present

## 2021-04-03 DIAGNOSIS — R110 Nausea: Secondary | ICD-10-CM | POA: Diagnosis not present

## 2021-04-03 DIAGNOSIS — E669 Obesity, unspecified: Secondary | ICD-10-CM | POA: Diagnosis present

## 2021-04-03 DIAGNOSIS — R202 Paresthesia of skin: Secondary | ICD-10-CM | POA: Diagnosis present

## 2021-04-03 DIAGNOSIS — Z981 Arthrodesis status: Secondary | ICD-10-CM | POA: Diagnosis not present

## 2021-04-03 DIAGNOSIS — Z6837 Body mass index (BMI) 37.0-37.9, adult: Secondary | ICD-10-CM

## 2021-04-03 DIAGNOSIS — G062 Extradural and subdural abscess, unspecified: Secondary | ICD-10-CM

## 2021-04-03 DIAGNOSIS — L8990 Pressure ulcer of unspecified site, unspecified stage: Secondary | ICD-10-CM | POA: Insufficient documentation

## 2021-04-03 DIAGNOSIS — Z800 Family history of malignant neoplasm of digestive organs: Secondary | ICD-10-CM | POA: Diagnosis not present

## 2021-04-03 DIAGNOSIS — Z23 Encounter for immunization: Secondary | ICD-10-CM

## 2021-04-03 DIAGNOSIS — F419 Anxiety disorder, unspecified: Secondary | ICD-10-CM | POA: Diagnosis present

## 2021-04-03 DIAGNOSIS — Z96642 Presence of left artificial hip joint: Secondary | ICD-10-CM | POA: Diagnosis present

## 2021-04-03 LAB — CBC
HCT: 26.8 % — ABNORMAL LOW (ref 39.0–52.0)
Hemoglobin: 8.4 g/dL — ABNORMAL LOW (ref 13.0–17.0)
MCH: 27.1 pg (ref 26.0–34.0)
MCHC: 31.3 g/dL (ref 30.0–36.0)
MCV: 86.5 fL (ref 80.0–100.0)
Platelets: 349 10*3/uL (ref 150–400)
RBC: 3.1 MIL/uL — ABNORMAL LOW (ref 4.22–5.81)
RDW: 14.7 % (ref 11.5–15.5)
WBC: 15.5 10*3/uL — ABNORMAL HIGH (ref 4.0–10.5)
nRBC: 0 % (ref 0.0–0.2)

## 2021-04-03 LAB — BASIC METABOLIC PANEL
Anion gap: 7 (ref 5–15)
BUN: 5 mg/dL — ABNORMAL LOW (ref 8–23)
CO2: 27 mmol/L (ref 22–32)
Calcium: 8.4 mg/dL — ABNORMAL LOW (ref 8.9–10.3)
Chloride: 96 mmol/L — ABNORMAL LOW (ref 98–111)
Creatinine, Ser: 0.47 mg/dL — ABNORMAL LOW (ref 0.61–1.24)
GFR, Estimated: >60 mL/min (ref >60)
Glucose, Bld: 132 mg/dL — ABNORMAL HIGH (ref 70–99)
Potassium: 3.8 mmol/L (ref 3.5–5.1)
Sodium: 130 mmol/L — ABNORMAL LOW (ref 135–145)

## 2021-04-03 MED ORDER — OXYCODONE HCL 10 MG PO TABS: 10.0000 mg | ORAL_TABLET | ORAL | 0 refills | Status: DC | PRN

## 2021-04-03 MED ORDER — POLYETHYLENE GLYCOL 3350 17 G PO PACK
17.0000 g | PACK | Freq: Every day | ORAL | Status: DC
  Administered 2021-04-05 – 2021-04-14 (×9): 17 g via ORAL
  Filled 2021-04-03 (×11): qty 1

## 2021-04-03 MED ORDER — ADULT MULTIVITAMIN W/MINERALS CH
1.0000 | ORAL_TABLET | Freq: Every day | ORAL | Status: DC
  Administered 2021-04-04 – 2021-05-08 (×35): 1 via ORAL
  Filled 2021-04-03 (×35): qty 1

## 2021-04-03 MED ORDER — DICLOFENAC SODIUM 75 MG PO TBEC
75.0000 mg | DELAYED_RELEASE_TABLET | Freq: Two times a day (BID) | ORAL | Status: DC
  Administered 2021-04-03 – 2021-05-08 (×66): 75 mg via ORAL
  Filled 2021-04-03 (×71): qty 1

## 2021-04-03 MED ORDER — POLYETHYLENE GLYCOL 3350 17 G PO PACK: 17.0000 g | PACK | Freq: Every day | ORAL | 0 refills | Status: DC | PRN

## 2021-04-03 MED ORDER — OXYCODONE HCL 5 MG PO TABS
10.0000 mg | ORAL_TABLET | ORAL | Status: DC | PRN
  Administered 2021-04-03 – 2021-04-14 (×21): 10 mg via ORAL
  Filled 2021-04-03 (×23): qty 2

## 2021-04-03 MED ORDER — BISACODYL 10 MG RE SUPP: 10.0000 mg | Freq: Every day | RECTAL | 0 refills | Status: DC | PRN

## 2021-04-03 MED ORDER — BISACODYL 10 MG RE SUPP: 10.0000 mg | Freq: Every day | RECTAL | Status: DC | PRN

## 2021-04-03 MED ORDER — SENNOSIDES-DOCUSATE SODIUM 8.6-50 MG PO TABS: 2.0000 | ORAL_TABLET | Freq: Two times a day (BID) | ORAL | Status: DC

## 2021-04-03 MED ORDER — COLCHICINE 0.6 MG PO TABS
0.6000 mg | ORAL_TABLET | Freq: Every day | ORAL | Status: DC
  Administered 2021-04-04 – 2021-05-08 (×35): 0.6 mg via ORAL
  Filled 2021-04-03 (×35): qty 1

## 2021-04-03 MED ORDER — POLYETHYLENE GLYCOL 3350 17 G PO PACK: 17.0000 g | PACK | Freq: Every day | ORAL | 0 refills | Status: DC

## 2021-04-03 MED ORDER — METHOCARBAMOL 500 MG PO TABS: 500.0000 mg | ORAL_TABLET | Freq: Four times a day (QID) | ORAL | Status: DC | PRN

## 2021-04-03 MED ORDER — METHOCARBAMOL 500 MG PO TABS
500.0000 mg | ORAL_TABLET | Freq: Four times a day (QID) | ORAL | Status: DC | PRN
  Administered 2021-04-04 – 2021-04-05 (×4): 500 mg via ORAL
  Filled 2021-04-03 (×4): qty 1

## 2021-04-03 MED ORDER — ACETAMINOPHEN 650 MG RE SUPP: 650.0000 mg | RECTAL | Status: DC | PRN

## 2021-04-03 MED ORDER — DICLOFENAC SODIUM 75 MG PO TBEC: 75.0000 mg | DELAYED_RELEASE_TABLET | Freq: Two times a day (BID) | ORAL | Status: DC

## 2021-04-03 MED ORDER — LUBIPROSTONE 24 MCG PO CAPS
24.0000 ug | ORAL_CAPSULE | Freq: Two times a day (BID) | ORAL | Status: DC
  Administered 2021-04-03 – 2021-05-08 (×70): 24 ug via ORAL
  Filled 2021-04-03 (×71): qty 1

## 2021-04-03 MED ORDER — VITAMIN D 25 MCG (1000 UNIT) PO TABS
1000.0000 [IU] | ORAL_TABLET | Freq: Every day | ORAL | Status: DC
  Administered 2021-04-04 – 2021-05-08 (×35): 1000 [IU] via ORAL
  Filled 2021-04-03 (×35): qty 1

## 2021-04-03 MED ORDER — ACETAMINOPHEN 325 MG PO TABS
650.0000 mg | ORAL_TABLET | ORAL | Status: DC | PRN
Start: 2021-04-03 — End: 2021-05-08
  Administered 2021-04-14: 650 mg via ORAL
  Filled 2021-04-03 (×4): qty 2

## 2021-04-03 MED ORDER — INFLUENZA VAC A&B SA ADJ QUAD 0.5 ML IM PRSY
0.5000 mL | PREFILLED_SYRINGE | INTRAMUSCULAR | Status: AC
  Administered 2021-04-05: 0.5 mL via INTRAMUSCULAR
  Filled 2021-04-03: qty 0.5

## 2021-04-03 MED ORDER — AMOXICILLIN 500 MG PO CAPS: 500.0000 mg | ORAL_CAPSULE | Freq: Three times a day (TID) | ORAL | Status: DC

## 2021-04-03 MED ORDER — ALUM & MAG HYDROXIDE-SIMETH 200-200-20 MG/5ML PO SUSP: 30.0000 mL | Freq: Four times a day (QID) | ORAL | 0 refills | Status: DC | PRN

## 2021-04-03 MED ORDER — ONDANSETRON HCL 4 MG PO TABS: 4.0000 mg | ORAL_TABLET | Freq: Four times a day (QID) | ORAL | Status: DC | PRN

## 2021-04-03 MED ORDER — LUBIPROSTONE 24 MCG PO CAPS: 24.0000 ug | ORAL_CAPSULE | Freq: Two times a day (BID) | ORAL | Status: DC

## 2021-04-03 MED ORDER — OXYCODONE HCL 5 MG PO TABS: 5.0000 mg | ORAL_TABLET | ORAL | 0 refills | Status: DC | PRN

## 2021-04-03 MED ORDER — ENOXAPARIN SODIUM 40 MG/0.4ML IJ SOSY: 40.0000 mg | PREFILLED_SYRINGE | INTRAMUSCULAR | Status: DC

## 2021-04-03 MED ORDER — ZOLPIDEM TARTRATE 5 MG PO TABS: 5.0000 mg | ORAL_TABLET | Freq: Every evening | ORAL | 0 refills | Status: DC | PRN

## 2021-04-03 MED ORDER — DOCUSATE SODIUM 100 MG PO CAPS: 100.0000 mg | ORAL_CAPSULE | Freq: Two times a day (BID) | ORAL | 0 refills | Status: DC

## 2021-04-03 MED ORDER — AMLODIPINE BESYLATE 5 MG PO TABS
5.0000 mg | ORAL_TABLET | Freq: Every day | ORAL | Status: DC
  Administered 2021-04-04 – 2021-04-09 (×6): 5 mg via ORAL
  Filled 2021-04-03 (×6): qty 1

## 2021-04-03 MED ORDER — ENOXAPARIN SODIUM 40 MG/0.4ML IJ SOSY
40.0000 mg | PREFILLED_SYRINGE | INTRAMUSCULAR | Status: DC
  Administered 2021-04-03 – 2021-05-07 (×35): 40 mg via SUBCUTANEOUS
  Filled 2021-04-03 (×35): qty 0.4

## 2021-04-03 MED ORDER — DOCUSATE SODIUM 100 MG PO CAPS
100.0000 mg | ORAL_CAPSULE | Freq: Two times a day (BID) | ORAL | Status: DC
Start: 2021-04-03 — End: 2021-05-08
  Administered 2021-04-03 – 2021-05-08 (×70): 100 mg via ORAL
  Filled 2021-04-03 (×70): qty 1

## 2021-04-03 MED ORDER — ACETAMINOPHEN 325 MG PO TABS
650.0000 mg | ORAL_TABLET | Freq: Four times a day (QID) | ORAL | Status: DC
  Administered 2021-04-03 – 2021-05-08 (×111): 650 mg via ORAL
  Filled 2021-04-03 (×118): qty 2

## 2021-04-03 MED ORDER — HYDROMORPHONE HCL 2 MG PO TABS
1.0000 mg | ORAL_TABLET | Freq: Four times a day (QID) | ORAL | Status: DC | PRN
  Administered 2021-04-03 – 2021-04-04 (×2): 1 mg via ORAL
  Filled 2021-04-03 (×3): qty 1

## 2021-04-03 MED ORDER — METHOCARBAMOL 1000 MG/10ML IJ SOLN
500.0000 mg | Freq: Four times a day (QID) | INTRAVENOUS | Status: DC | PRN
  Filled 2021-04-03: qty 5

## 2021-04-03 MED ORDER — LACTULOSE ENEMA
300.0000 mL | Freq: Every day | ORAL | Status: DC | PRN
  Filled 2021-04-03: qty 300

## 2021-04-03 MED ORDER — PANTOPRAZOLE SODIUM 40 MG PO TBEC: 40.0000 mg | DELAYED_RELEASE_TABLET | Freq: Every day | ORAL | Status: DC

## 2021-04-03 MED ORDER — AMOXICILLIN 250 MG PO CAPS
500.0000 mg | ORAL_CAPSULE | Freq: Three times a day (TID) | ORAL | Status: DC
  Administered 2021-04-03 – 2021-05-08 (×104): 500 mg via ORAL
  Filled 2021-04-03 (×108): qty 2

## 2021-04-03 MED ORDER — OXYCODONE HCL 5 MG PO TABS
5.0000 mg | ORAL_TABLET | ORAL | Status: DC | PRN
  Administered 2021-04-06 – 2021-04-12 (×6): 5 mg via ORAL
  Filled 2021-04-03 (×7): qty 1

## 2021-04-03 MED ORDER — PANTOPRAZOLE SODIUM 40 MG PO TBEC
40.0000 mg | DELAYED_RELEASE_TABLET | Freq: Every day | ORAL | Status: DC
  Administered 2021-04-03 – 2021-05-07 (×35): 40 mg via ORAL
  Filled 2021-04-03 (×35): qty 1

## 2021-04-03 MED ORDER — SENNOSIDES-DOCUSATE SODIUM 8.6-50 MG PO TABS
2.0000 | ORAL_TABLET | Freq: Two times a day (BID) | ORAL | Status: DC
  Administered 2021-04-03 – 2021-05-08 (×70): 2 via ORAL
  Filled 2021-04-03 (×70): qty 2

## 2021-04-03 MED ORDER — POLYETHYLENE GLYCOL 3350 17 G PO PACK
17.0000 g | PACK | Freq: Every day | ORAL | Status: DC | PRN
  Administered 2021-04-04 – 2021-04-15 (×3): 17 g via ORAL
  Filled 2021-04-03 (×4): qty 1

## 2021-04-03 MED ORDER — ONDANSETRON HCL 4 MG/2ML IJ SOLN: 4.0000 mg | Freq: Four times a day (QID) | INTRAMUSCULAR | Status: DC | PRN

## 2021-04-03 NOTE — Progress Notes (Signed)
Patient refused CPAP. No unit in room at this time. 

## 2021-04-03 NOTE — PMR Pre-admission (Signed)
PMR Admission Coordinator Pre-Admission Assessment  Patient: Marco Kennedy is an 70 y.o., male MRN: [id number] DOB: 08-11-1950 Height: 5' 7"  (170.2 cm) Weight: 106.1 kg  Insurance Information HMO:     PPO: yes     PCP:      IPA:      80/20:      OTHER:  PRIMARY: [employer] [place]      Policy#: [id number]      Subscriber:  [name]     Phone#: [phone]    Fax#: [phone]-Cert#: [id number] CIR from RMegargelwith updates due to her at fax listed above on 10/13.      Employer:  Benefits:  Phone #: [phone]    Name:  Eff. Date: 06/28/20     Deduct: $500 (met $500)      Out of Pocket Max: $4000 (met $4000)     Life Max: n/a CIR: 80%      SNF: 80% Outpatient: 80%     Co-Ins: 20% Home Health: 80%      Co-Ins: 20% DME: 80%     Co-Ins: 20% Providers:  SECONDARY: Medicare part A       Policy#: [id number]    Phone#:   Financial Counselor:       Phone#:   The "Data Collection Information Summary" for patients in Inpatient Rehabilitation Facilities with attached "Privacy Act [place]" was provided and verbally reviewed with: Patient and Family  Emergency Contact Information Contact Information     Name Relation Home Work Mobile   AMission HillSpouse   4CommerceDaughter   [phone]  HOrvan, Papadakis  [phone]      Current Medical History  Patient Admitting Diagnosis: epidural abscess   History of Present Illness: Marco Kennedy a 70year old right-handed male with history of obesity, OSA maintained on CPAP and recent admission 01/10/2021 - 01/16/2021 for sepsis secondary to moderate-sized abscess near the pelvic hardware.  Neurosurgery interventional radiology and infectious disease were involved in his care at that point underwent wound debridement with neurosurgery on 7/19 with E. coli from wound cultures.  He was given cefazolin 1 with plan of 6 weeks of treatment.  He was discharged to home noted  bilateral rib pain needing to sleep in a lift chair.  Home health nurse 7/25 to assess the PICC line recommended follow-up PCP they decided to try telehealth but by that time patient was noted to have some coughing and nonspecific chest discomfort.  He developed low-grade fevers and was admitted 01/20/2021.  He did undergo bilateral pigtail catheters placed in pleural cavities.  Neurosurgery follow-up in regards to osteomyelitis with no current plan for repeat surgical intervention at that time he was maintained on cefazolin and most recent blood cultures negative.  He was admitted to inpatient rehab services 02/03/2021 - 02/16/2021 for vertebral osteomyelitis.  He was discharged home ambulating 100 feet contact-guard assist.  Presented 03/14/2021 for worsening discitis as well as enlarging epidural abscess T9-T10 resulting in spinal stenosis.  Patient underwent emergent T9-T10 laminectomy for decompression of thecal sac 03/14/2021 per Dr. NKathyrn Sheriff  Hospital course followed by neurosurgery Dr. SVertell Limberpatient returned to the OR for reconstruction and correction of thoracic kyphosis and decompression of thoracic spinal cord 03/31/2021 undergoing T6-T9 posterior decompression and extension of fusion to thoracic 11, decompression of spinal cord dura T9-T11 laminectomy, posterior lateral arthrodesis T6-T11 levels as well as placement of Jackson-Pratt drain.  TLSO back brace when out of bed only, applied in sitting position.  Infectious disease consulted he has been transitioned from intravenous cefazolin to p.o. amoxicillin.  Maintained on subcutaneous Lovenox for DVT prophylaxis.  Therapy evaluations completed due to patient decreased functional mobility was recommended for a comprehensive rehab program    Patient's medical record from [facility] has been reviewed by the rehabilitation admission coordinator and physician.  Past Medical History  Past Medical History:  Diagnosis Date   Arthritis    arthritis-bilateral  knees, left Hip.   Degenerative lumbar spinal stenosis    EKG abnormality    06-08-16- being evaluated by cardiologist  in [place]   Idiopathic scoliosis of lumbar region    Joint stiffness    Knee pain    Leg swelling    Low back pain    Achy, shooting pain.  Hurts to Walk or stand up straight.   Lumbar radiculopathy    Scoliosis concern    Sleep apnea    Bipap use nightly 25/19 -3l/m oxygen   Spondylolisthesis of lumbar region    Transfusion history    [place] ,[place] after bleeding post colon polyp removal and colonscopy procedure    Has the patient had major surgery during 100 days prior to admission? Yes  Family History   family history includes Arthritis in his mother; Hypertension in his brother and mother; Stomach cancer in his father.  Current Medications  Current Facility-Administered Medications:    0.9 %  sodium chloride infusion, 250 mL, Intravenous, Continuous, Erline Levine, MD   acetaminophen (TYLENOL) tablet 650 mg, 650 mg, Oral, Q4H PRN **OR** acetaminophen (TYLENOL) suppository 650 mg, 650 mg, Rectal, Q4H PRN, Erline Levine, MD   acetaminophen (TYLENOL) tablet 650 mg, 650 mg, Oral, Q6H, Rick Duff, MD, 650 mg at 04/03/21 0523   alum & mag hydroxide-simeth (MAALOX/MYLANTA) 200-200-20 MG/5ML suspension 30 mL, 30 mL, Oral, Q6H PRN, Erline Levine, MD   amLODipine (NORVASC) tablet 5 mg, 5 mg, Oral, Daily, Rick Duff, MD, 5 mg at 04/03/21 1941   amoxicillin (AMOXIL) capsule 500 mg, 500 mg, Oral, Q8H, Carlyle Basques, MD, 500 mg at 04/03/21 7408   bisacodyl (DULCOLAX) suppository 10 mg, 10 mg, Rectal, Daily PRN, Erline Levine, MD, 10 mg at 04/02/21 1020   Chlorhexidine Gluconate Cloth 2 % PADS 6 each, 6 each, Topical, Daily, Aldine Contes, MD, 6 each at 04/03/21 0837   cholecalciferol (VITAMIN D3) tablet 1,000 Units, 1,000 Units, Oral, Daily, Erline Levine, MD, 1,000 Units at 04/03/21 1448   colchicine tablet 0.6 mg, 0.6 mg, Oral, Daily, Erline Levine, MD, 0.6 mg at 04/03/21 0838   dextrose 5 %-0.45 % sodium chloride infusion, , Intravenous, Continuous, Orvis Brill, MD, Last Rate: 75 mL/hr at 04/03/21 0018, New Bag at 04/03/21 0018   diclofenac (VOLTAREN) EC tablet 75 mg, 75 mg, Oral, BID, Erline Levine, MD, 75 mg at 04/01/21 2200   docusate sodium (COLACE) capsule 100 mg, 100 mg, Oral, BID, Erline Levine, MD, 100 mg at 04/03/21 0838   enoxaparin (LOVENOX) injection 40 mg, 40 mg, Subcutaneous, Q24H, Iona Beard, MD, 40 mg at 04/02/21 2137   HYDROmorphone (DILAUDID) injection 1 mg, 1 mg, Intravenous, Q3H PRN, Jose Persia, MD, 1 mg at 04/03/21 1007   lactulose (CHRONULAC) enema 200 gm, 300 mL, Rectal, Daily PRN, Rick Duff, MD   lubiprostone (AMITIZA) capsule 24 mcg, 24 mcg, Oral, BID WC, Rick Duff, MD, 24 mcg at 04/03/21 1856   menthol-cetylpyridinium (CEPACOL) lozenge 3 mg, 1  lozenge, Oral, PRN **OR** phenol (CHLORASEPTIC) mouth spray 1 spray, 1 spray, Mouth/Throat, PRN, Erline Levine, MD   methocarbamol (ROBAXIN) tablet 500 mg, 500 mg, Oral, Q6H PRN, 500 mg at 04/03/21 0202 **OR** methocarbamol (ROBAXIN) 500 mg in dextrose 5 % 50 mL IVPB, 500 mg, Intravenous, Q6H PRN, Erline Levine, MD, Last Rate: 100 mL/hr at 04/01/21 1246, 500 mg at 04/01/21 1246   multivitamin with minerals tablet 1 tablet, 1 tablet, Oral, Daily, Erline Levine, MD, 1 tablet at 04/03/21 0838   ondansetron (ZOFRAN) tablet 4 mg, 4 mg, Oral, Q6H PRN **OR** ondansetron (ZOFRAN) injection 4 mg, 4 mg, Intravenous, Q6H PRN, Erline Levine, MD   oxyCODONE (Oxy IR/ROXICODONE) immediate release tablet 5 mg, 5 mg, Oral, Q4H PRN **OR** oxyCODONE (Oxy IR/ROXICODONE) immediate release tablet 10 mg, 10 mg, Oral, Q4H PRN, Jose Persia, MD, 10 mg at 04/03/21 0838   pantoprazole (PROTONIX) EC tablet 40 mg, 40 mg, Oral, QHS, Reome, Earle J, RPH, 40 mg at 04/02/21 2137   polyethylene glycol (MIRALAX / GLYCOLAX) packet 17 g, 17 g, Oral, Daily, Rick Duff,  MD, 17 g at 04/03/21 0842   polyethylene glycol (MIRALAX / GLYCOLAX) packet 17 g, 17 g, Oral, Daily PRN, Erline Levine, MD   senna-docusate (Senokot-S) tablet 2 tablet, 2 tablet, Oral, BID, Orvis Brill, MD, 2 tablet at 04/03/21 0837   sodium chloride flush (NS) 0.9 % injection 3 mL, 3 mL, Intravenous, Q12H, Erline Levine, MD, 3 mL at 04/03/21 0842   sodium chloride flush (NS) 0.9 % injection 3 mL, 3 mL, Intravenous, PRN, Erline Levine, MD   sodium chloride flush (NS) 0.9 % injection 5 mL, 5 mL, Intracatheter, Q8H, Suttle, Dylan J, MD, 5 mL at 04/02/21 2142   zolpidem (AMBIEN) tablet 5 mg, 5 mg, Oral, QHS PRN, Erline Levine, MD  Patients Current Diet:  Diet Order             Diet regular Room service appropriate? Yes; Fluid consistency: Thin  Diet effective now                   Precautions / Restrictions Precautions Precautions: Fall, Back Precaution Booklet Issued: No Precaution Comments: pt able to verbalize 3/3 back precautions, requires cues for implementation during session Spinal Brace: Thoracolumbosacral orthotic, Applied in sitting position Spinal Brace Comments: TLSO when OOB only Restrictions Weight Bearing Restrictions: No   Has the patient had 2 or more falls or a fall with injury in the past year? No  Prior Activity Level Household: Pt recently discharged from [facility] in August, household ambulator with only out for appointments since that time, using DME, supervision to min assist from spouse  Prior Functional Level Self Care: Did the patient need help bathing, dressing, using the toilet or eating? Needed some help  Indoor Mobility: Did the patient need assistance with walking from room to room (with or without device)? Needed some help  Stairs: Did the patient need assistance with internal or external stairs (with or without device)? Dependent  Functional Cognition: Did the patient need help planning regular tasks such as shopping or remembering to take  medications? Independent  Patient Information Are you of Hispanic, Latino/a,or Spanish origin?: A. No, not of Hispanic, Latino/a, or Spanish origin What is your race?: A. White Do you need or want an interpreter to communicate with a doctor or health care staff?: 0. No  Patient's Response To:  Health Literacy and Transportation Is the patient able to respond to health literacy and transportation  needs?: Yes Health Literacy - How often do you need to have someone help you when you read instructions, pamphlets, or other written material from your doctor or pharmacy?: Sometimes In the past 12 months, has lack of transportation kept you from medical appointments or from getting medications?: No In the past 12 months, has lack of transportation kept you from meetings, work, or from getting things needed for daily living?: No  [place] / [place] Devices/Equipment: Environmental consultant (specify type) Home Equipment: Shower seat, Bedside commode, Environmental consultant - 2 wheels, Cane - single point, [employer] - 4 wheels, Other (comment), Toilet riser, Wheelchair - manual  Prior Device Use: Indicate devices/aids used by the patient prior to current illness, exacerbation or injury? Walker  Current Functional Level Cognition  Overall Cognitive Status: Within Functional Limits for tasks assessed Orientation Level: Oriented X4 General Comments: Pt anxious for mobility due to pain and BLE numbness; however willing to participate in all tasks asked of him    Extremity Assessment (includes Sensation/Coordination)  Upper Extremity Assessment: Overall WFL for tasks assessed (slight tremor noted after transitioning to sitting EOB; likely pain related)  Lower Extremity Assessment: Defer to PT evaluation RLE: Unable to fully assess due to pain RLE Sensation: decreased light touch RLE Coordination: decreased gross motor LLE: Unable to fully assess due to pain LLE Sensation: decreased light touch LLE  Coordination: decreased gross motor    ADLs  Overall ADL's : Needs assistance/impaired Eating/Feeding: Set up, Bed level Grooming: Set up, Sitting Grooming Details (indicate cue type and reason): pt able to complete bimanual groming tasks while sitting EOB this session Upper Body Bathing: Minimal assistance, Sitting Upper Body Bathing Details (indicate cue type and reason): incrased time and min A for trunk support Lower Body Bathing: Maximal assistance, +2 for physical assistance, +2 for safety/equipment, Sit to/from stand Upper Body Dressing : Moderate assistance, Sitting Upper Body Dressing Details (indicate cue type and reason): Mod A for tunk support in sitting, significant incrsaed time for pain management, total A for TLSO in sitting Lower Body Dressing: Total assistance, +2 for safety/equipment, +2 for physical assistance, Bed level, Sit to/from stand Toilet Transfer: Maximal assistance, +2 for physical assistance, +2 for safety/equipment, Squat-pivot Toilet Transfer Details (indicate cue type and reason): bed level; bed pan placement at this time due to pain with OOB transfers Toileting- Clothing Manipulation and Hygiene: +2 for physical assistance, +2 for safety/equipment, Maximal assistance, Sit to/from stand Functional mobility during ADLs: Maximal assistance, +2 for physical assistance, +2 for safety/equipment General ADL Comments: Pt is limited by pain, anxiety and BLE numbness/weakness    Mobility  Overal bed mobility: Needs Assistance Bed Mobility: Rolling, Sidelying to Sit, Sit to Sidelying Rolling: Mod assist Sidelying to sit: Max assist, +2 for physical assistance Supine to sit: Mod assist Sit to supine: Mod assist, +2 for physical assistance, +2 for safety/equipment Sit to sidelying: Mod assist, +2 for physical assistance General bed mobility comments: verbal cues to implament back precautions    Transfers  Overall transfer level: Needs assistance Equipment used: 2  person hand held assist Transfer via Lift Equipment: Stedy Transfers: Sit to/from Stand, Audiological scientist to Stand: From elevated surface, Max assist, +2 physical assistance  Lateral/Scoot Transfers: Max assist, +2 physical assistance General transfer comment: PT/OT assist with bilateral knee block and BUE support. Pt with knee buckling with any attempts at LE movement in standing. Pt does demonstrate ability to activate quads and hip extensors when verbally cues for posture.  Ambulation / Gait / Stairs / Wheelchair Mobility  Ambulation/Gait Ambulation/Gait assistance:  (unable to clear foot due to LE weakness and high falls risk) General Gait Details: unable    Posture / Balance Dynamic Sitting Balance Sitting balance - Comments: reliant on UE support of bed Balance Overall balance assessment: Needs assistance Sitting-balance support: Feet supported, Single extremity supported, Bilateral upper extremity supported Sitting balance-Leahy Scale: Poor Sitting balance - Comments: reliant on UE support of bed Postural control: Posterior lean Standing balance support: Bilateral upper extremity supported Standing balance-Leahy Scale: Zero Standing balance comment: maxA x 2 with BUE support and bilateral knee block    Special needs/care consideration Skin surgical incisions to back   Previous Home Environment (from acute therapy documentation) Living Arrangements: Spouse/significant other Available Help at Discharge: Available 24 hours/day Type of Home: House Home Layout: Two level, Able to live on main level with bedroom/bathroom Alternate Level Stairs-Rails: Right Alternate Level Stairs-Number of Steps: flight Home Access: Stairs to enter Entrance Stairs-Rails: Right, Can reach both Entrance Stairs-Number of Steps: approximately 14 total (6-7 steps followed by a walkway then 6-7 additional steps) Bathroom Shower/Tub: Chiropodist: Handicapped  height Bathroom Accessibility: Yes How Accessible: Accessible via walker Home Care Services: No Additional Comments: pt's wife leaves the home to work sometimes, his nephew stays with him while she is out  Discharge Living Setting Plans for Discharge Living Setting: Patient's home, Lives with (comment) (spouse) Type of Home at Discharge: House Discharge Home Layout: Able to live on main level with bedroom/bathroom Discharge Home Access: Level entry (out the back door) Discharge Bathroom Shower/Tub: Tub/shower unit Discharge Bathroom Toilet: Handicapped height Discharge Bathroom Accessibility: Yes How Accessible: Accessible via walker Does the patient have any problems obtaining your medications?: No  Social/Family/Support Systems Patient Roles: Spouse Anticipated Caregiver: spouse, [name] Anticipated Ambulance person Information: [phone] Ability/Limitations of Caregiver: none Caregiver Availability: 24/7 Discharge Plan Discussed with Primary Caregiver: Yes Is Caregiver In Agreement with Plan?: Yes Does Caregiver/Family have Issues with Lodging/Transportation while Pt is in Rehab?: No  Goals Patient/Family Goal for Rehab: PT/OT supervision to min assist Expected length of stay: 18-21 days Additional Information: previously on SCI team Pt/Family Agrees to Admission and willing to participate: Yes Program Orientation Provided & Reviewed with Pt/Caregiver Including Roles  & Responsibilities: Yes  Barriers to Discharge: Insurance for SNF coverage  Decrease burden of Care through IP rehab admission: n/a  Possible need for SNF placement upon discharge: no  Patient Condition: I have reviewed medical records from [facility], spoken with CM, and patient and spouse. I met with patient at the bedside for inpatient rehabilitation assessment.  Patient will benefit from ongoing PT and OT, can actively participate in 3 hours of therapy a day 5 days of the week, and can make measurable  gains during the admission.  Patient will also benefit from the coordinated team approach during an Inpatient Acute Rehabilitation admission.  The patient will receive intensive therapy as well as Rehabilitation physician, nursing, social worker, and care management interventions.  Due to safety, skin/wound care, disease management, medication administration, pain management, and patient education the patient requires 24 hour a day rehabilitation nursing.  The patient is currently max +2 with mobility and basic ADLs.  Discharge setting and therapy post discharge at home with home health is anticipated.  Patient has agreed to participate in the Acute Inpatient Rehabilitation Program and will admit today.  Preadmission Screen Completed By:  Michel Santee, PT, DPT 04/03/2021 10:08 AM ______________________________________________________________________  Discussed status with Dr. Ranell Patrick on 10:19 AM at 04/03/21 and received approval for admission today.  Admission Coordinator:  Michel Santee, PT, DPT time 10:19 AM Sudie Grumbling 04/03/21    Assessment/Plan: Diagnosis: Epidural abscess Does the need for close, 24 hr/day Medical supervision in concert with the patient's rehab needs make it unreasonable for this patient to be served in a less intensive setting? Yes Co-Morbidities requiring supervision/potential complications: vertebral osteomyelitis, empyema, sepsis secondary to E. Coli, class 3 obesity, OSA Due to bladder management, bowel management, safety, skin/wound care, disease management, medication administration, pain management, and patient education, does the patient require 24 hr/day rehab nursing? Yes Does the patient require coordinated care of a physician, rehab nurse, PT, OT to address physical and functional deficits in the context of the above medical diagnosis(es)? Yes Addressing deficits in the following areas: balance, endurance, locomotion, strength, transferring, bowel/bladder control,  bathing, dressing, feeding, grooming, toileting, and psychosocial support Can the patient actively participate in an intensive therapy program of at least 3 hrs of therapy 5 days a week? Yes The potential for patient to make measurable gains while on inpatient rehab is excellent Anticipated functional outcomes upon discharge from inpatient rehab: min assist PT, min assist OT, independent SLP Estimated rehab length of stay to reach the above functional goals is: 14 days Anticipated discharge destination: Home 10. Overall Rehab/Functional Prognosis: excellent   MD Signature: Leeroy Cha, MD

## 2021-04-03 NOTE — TOC Transition Note (Signed)
Transition of Care [facility]) - CM/SW Discharge Note   Patient Details  Name: AYOMIKUN STARLING MRN: [id number] Date of Birth: 02-Feb-1951  [facility]) CM/SW Contact:  Kermit Balo, RN Phone Number: 04/03/2021, 11:27 AM   Clinical Narrative:    Patient is discharging to CIR today. CM signing off.   Final next level of care: IP Rehab Facility Barriers to Discharge: No Barriers Identified   Patient Goals and CMS Choice     Choice offered to / list presented to : Patient  Discharge Placement                       Discharge Plan and Services                                     Social Determinants of Health (SDOH) Interventions     Readmission Risk Interventions No flowsheet data found.

## 2021-04-03 NOTE — Progress Notes (Signed)
Inpatient Rehabilitation Medication Review by a Pharmacist  A complete drug regimen review was completed for this patient to identify any potential clinically significant medication issues.  High Risk Drug Classes Is patient taking? Indication by Medication  Antipsychotic No   Anticoagulant Yes DVT px  Antibiotic Yes Epidural abscess  Opioid Yes PRN pain  Antiplatelet No   Hypoglycemics/insulin No   Vasoactive Medication No   Chemotherapy No   Other Yes Colchicine for gout     Type of Medication Issue Identified Description of Issue Recommendation(s)  Drug Interaction(s) (clinically significant)     Duplicate Therapy     Allergy     No Medication Administration End Date     Incorrect Dose     Additional Drug Therapy Needed  alum & mag hydroxide-simeth/zolpidem - not using Not to restart  Significant med changes from prior encounter (inform family/care partners about these prior to discharge).    Other       Clinically significant medication issues were identified that warrant physician communication and completion of prescribed/recommended actions by midnight of the next day:  No  Name of provider notified for urgent issues identified:   Provider Method of Notification:     Pharmacist comments:   Time spent performing this drug regimen review (minutes):  10   Ulyses Southward, PharmD, [place], AAHIVP, CPP Infectious Disease Pharmacist 04/03/2021 3:01 PM

## 2021-04-03 NOTE — Progress Notes (Signed)
PMR Admission Coordinator Pre-Admission Assessment   Patient: Marco Kennedy is an 70 y.o., male MRN: [id number] DOB: 1950/09/10 Height: 5' 7"  (170.2 cm) Weight: 106.1 kg   Insurance Information HMO:     PPO: yes     PCP:      IPA:      80/20:      OTHER:  PRIMARY: [employer]      Policy#: [id number]      Subscriber:  [name]     Phone#: [phone]    Fax#: [phone]-Cert#: [id number] updates due to her at fax listed above on 10/13.      Employer:  Benefits:  Phone #: [phone]    Name:  Eff. Date: 06/28/20     Deduct: $500 (met $500)      Out of Pocket Max: $4000 (met $4000)     Life Max: n/a CIR: 80%      SNF: 80% Outpatient: 80%     Co-Ins: 20% Home Health: 80%      Co-Ins: 20% DME: 80%     Co-Ins: 20% Providers:  SECONDARY: Medicare part A       Policy#: [id number]    Phone#:    Financial Counselor:       Phone#:    The "Data Collection Information Summary" for patients in Inpatient Rehabilitation Facilities with attached "Privacy Act SMeadeRecords" was provided and verbally reviewed with: Patient and Family   Emergency Contact Information Contact Information       Name Relation Home Work Mobile    [place]echanicsburgSpouse     4Burr OakDaughter     [phone]   [name], [name]          Current Medical History  Patient Admitting Diagnosis: epidural abscess    History of Present Illness: Marco Dishmanis a 70year old right-handed male with history of obesity, OSA maintained on CPAP and recent admission 01/10/2021 - 01/16/2021 for sepsis secondary to moderate-sized abscess near the pelvic hardware.  Neurosurgery interventional radiology and infectious disease were involved in his care at that point underwent wound debridement with neurosurgery on 7/19 with E. coli from wound cultures.  He was given cefazolin 1 with plan of 6 weeks of treatment.  He was  discharged to home noted bilateral rib pain needing to sleep in a lift chair.  Home health nurse 7/25 to assess the PICC line recommended follow-up PCP they decided to try telehealth but by that time patient was noted to have some coughing and nonspecific chest discomfort.  He developed low-grade fevers and was admitted 01/20/2021.  He did undergo bilateral pigtail catheters placed in pleural cavities.  Neurosurgery follow-up in regards to osteomyelitis with no current plan for repeat surgical intervention at that time he was maintained on cefazolin and most recent blood cultures negative.  He was admitted to inpatient rehab services 02/03/2021 - 02/16/2021 for vertebral osteomyelitis.  He was discharged home ambulating 100 feet contact-guard assist.  Presented 03/14/2021 for worsening discitis as well as enlarging epidural abscess T9-T10 resulting in spinal stenosis.  Patient underwent emergent T9-T10 laminectomy for decompression of thecal sac 03/14/2021 per Dr. NKathyrn Sheriff  Hospital course followed by neurosurgery Dr. SVertell Limberpatient returned to the OR for reconstruction and correction of thoracic kyphosis and decompression of thoracic spinal cord 03/31/2021 undergoing T6-T9 posterior decompression and extension of fusion to thoracic  11, decompression of spinal cord dura T9-T11 laminectomy, posterior lateral arthrodesis T6-T11 levels as well as placement of Jackson-Pratt drain.  TLSO back brace when out of bed only, applied in sitting position.  Infectious disease consulted he has been transitioned from intravenous cefazolin to p.o. amoxicillin.  Maintained on subcutaneous Lovenox for DVT prophylaxis.  Therapy evaluations completed due to patient decreased functional mobility was recommended for a comprehensive rehab program   Patient's medical record from [facility] has been reviewed by the rehabilitation admission coordinator and physician.   Past Medical History      Past Medical History:  Diagnosis Date    Arthritis      arthritis-bilateral knees, left Hip.   Degenerative lumbar spinal stenosis     EKG abnormality      06-08-16- being evaluated by cardiologist  in [place]   Idiopathic scoliosis of lumbar region     Joint stiffness     Knee pain     Leg swelling     Low back pain      Achy, shooting pain.  Hurts to Walk or stand up straight.   Lumbar radiculopathy     Scoliosis concern     Sleep apnea      Bipap use nightly 25/19 -3l/m oxygen   Spondylolisthesis of lumbar region     Transfusion history      [place] ,[place] after bleeding post colon polyp removal and colonscopy procedure      Has the patient had major surgery during 100 days prior to admission? Yes   Family History   family history includes Arthritis in his mother; Hypertension in his brother and mother; Stomach cancer in his father.   Current Medications   Current Facility-Administered Medications:    0.9 %  sodium chloride infusion, 250 mL, Intravenous, Continuous, Erline Levine, MD   acetaminophen (TYLENOL) tablet 650 mg, 650 mg, Oral, Q4H PRN **OR** acetaminophen (TYLENOL) suppository 650 mg, 650 mg, Rectal, Q4H PRN, Erline Levine, MD   acetaminophen (TYLENOL) tablet 650 mg, 650 mg, Oral, Q6H, Rick Duff, MD, 650 mg at 04/03/21 0523   alum & mag hydroxide-simeth (MAALOX/MYLANTA) 200-200-20 MG/5ML suspension 30 mL, 30 mL, Oral, Q6H PRN, Erline Levine, MD   amLODipine (NORVASC) tablet 5 mg, 5 mg, Oral, Daily, Rick Duff, MD, 5 mg at 04/03/21 5597   amoxicillin (AMOXIL) capsule 500 mg, 500 mg, Oral, Q8H, Carlyle Basques, MD, 500 mg at 04/03/21 4163   bisacodyl (DULCOLAX) suppository 10 mg, 10 mg, Rectal, Daily PRN, Erline Levine, MD, 10 mg at 04/02/21 1020   Chlorhexidine Gluconate Cloth 2 % PADS 6 each, 6 each, Topical, Daily, Aldine Contes, MD, 6 each at 04/03/21 0837   cholecalciferol (VITAMIN D3) tablet 1,000 Units, 1,000 Units, Oral, Daily, Erline Levine, MD, 1,000 Units at 04/03/21 8453    colchicine tablet 0.6 mg, 0.6 mg, Oral, Daily, Erline Levine, MD, 0.6 mg at 04/03/21 0838   dextrose 5 %-0.45 % sodium chloride infusion, , Intravenous, Continuous, Orvis Brill, MD, Last Rate: 75 mL/hr at 04/03/21 0018, New Bag at 04/03/21 0018   diclofenac (VOLTAREN) EC tablet 75 mg, 75 mg, Oral, BID, Erline Levine, MD, 75 mg at 04/01/21 2200   docusate sodium (COLACE) capsule 100 mg, 100 mg, Oral, BID, Erline Levine, MD, 100 mg at 04/03/21 0838   enoxaparin (LOVENOX) injection 40 mg, 40 mg, Subcutaneous, Q24H, Iona Beard, MD, 40 mg at 04/02/21 2137   HYDROmorphone (DILAUDID) injection 1 mg, 1 mg, Intravenous, Q3H PRN, Jose Persia, MD,  1 mg at 04/03/21 1007   lactulose (CHRONULAC) enema 200 gm, 300 mL, Rectal, Daily PRN, Rick Duff, MD   lubiprostone (AMITIZA) capsule 24 mcg, 24 mcg, Oral, BID WC, Rick Duff, MD, 24 mcg at 04/03/21 7867   menthol-cetylpyridinium (CEPACOL) lozenge 3 mg, 1 lozenge, Oral, PRN **OR** phenol (CHLORASEPTIC) mouth spray 1 spray, 1 spray, Mouth/Throat, PRN, Erline Levine, MD   methocarbamol (ROBAXIN) tablet 500 mg, 500 mg, Oral, Q6H PRN, 500 mg at 04/03/21 0202 **OR** methocarbamol (ROBAXIN) 500 mg in dextrose 5 % 50 mL IVPB, 500 mg, Intravenous, Q6H PRN, Erline Levine, MD, Last Rate: 100 mL/hr at 04/01/21 1246, 500 mg at 04/01/21 1246   multivitamin with minerals tablet 1 tablet, 1 tablet, Oral, Daily, Erline Levine, MD, 1 tablet at 04/03/21 0838   ondansetron (ZOFRAN) tablet 4 mg, 4 mg, Oral, Q6H PRN **OR** ondansetron (ZOFRAN) injection 4 mg, 4 mg, Intravenous, Q6H PRN, Erline Levine, MD   oxyCODONE (Oxy IR/ROXICODONE) immediate release tablet 5 mg, 5 mg, Oral, Q4H PRN **OR** oxyCODONE (Oxy IR/ROXICODONE) immediate release tablet 10 mg, 10 mg, Oral, Q4H PRN, Jose Persia, MD, 10 mg at 04/03/21 0838   pantoprazole (PROTONIX) EC tablet 40 mg, 40 mg, Oral, QHS, Reome, Earle J, RPH, 40 mg at 04/02/21 2137   polyethylene glycol (MIRALAX /  GLYCOLAX) packet 17 g, 17 g, Oral, Daily, Rick Duff, MD, 17 g at 04/03/21 0842   polyethylene glycol (MIRALAX / GLYCOLAX) packet 17 g, 17 g, Oral, Daily PRN, Erline Levine, MD   senna-docusate (Senokot-S) tablet 2 tablet, 2 tablet, Oral, BID, Orvis Brill, MD, 2 tablet at 04/03/21 0837   sodium chloride flush (NS) 0.9 % injection 3 mL, 3 mL, Intravenous, Q12H, Erline Levine, MD, 3 mL at 04/03/21 0842   sodium chloride flush (NS) 0.9 % injection 3 mL, 3 mL, Intravenous, PRN, Erline Levine, MD   sodium chloride flush (NS) 0.9 % injection 5 mL, 5 mL, Intracatheter, Q8H, Suttle, Dylan J, MD, 5 mL at 04/02/21 2142   zolpidem (AMBIEN) tablet 5 mg, 5 mg, Oral, QHS PRN, Erline Levine, MD   Patients Current Diet:  Diet Order                  Diet regular Room service appropriate? Yes; Fluid consistency: Thin  Diet effective now                         Precautions / Restrictions Precautions Precautions: Fall, Back Precaution Booklet Issued: No Precaution Comments: pt able to verbalize 3/3 back precautions, requires cues for implementation during session Spinal Brace: Thoracolumbosacral orthotic, Applied in sitting position Spinal Brace Comments: TLSO when OOB only Restrictions Weight Bearing Restrictions: No    Has the patient had 2 or more falls or a fall with injury in the past year? No   Prior Activity Level Household: Pt recently discharged from [facility] in August, household ambulator with only out for appointments since that time, using DME, supervision to min assist from spouse   Prior Functional Level Self Care: Did the patient need help bathing, dressing, using the toilet or eating? Needed some help   Indoor Mobility: Did the patient need assistance with walking from room to room (with or without device)? Needed some help   Stairs: Did the patient need assistance with internal or external stairs (with or without device)? Dependent   Functional Cognition: Did the  patient need help planning regular tasks such as shopping or remembering to  take medications? Independent   Patient Information Are you of Hispanic, Latino/a,or Spanish origin?: A. No, not of Hispanic, Latino/a, or Spanish origin What is your race?: A. White Do you need or want an interpreter to communicate with a doctor or health care staff?: 0. No   Patient's Response To:  Health Literacy and Transportation Is the patient able to respond to health literacy and transportation needs?: Yes Health Literacy - How often do you need to have someone help you when you read instructions, pamphlets, or other written material from your doctor or pharmacy?: Sometimes In the past 12 months, has lack of transportation kept you from medical appointments or from getting medications?: No In the past 12 months, has lack of transportation kept you from meetings, work, or from getting things needed for daily living?: No   Home Assistive Devices / Clute Devices/Equipment: Environmental consultant (specify type) Home Equipment: Shower seat, Bedside commode, Environmental consultant - 2 wheels, Cane - single point, [employer] - 4 wheels, Other (comment), Toilet riser, Wheelchair - manual   Prior Device Use: Indicate devices/aids used by the patient prior to current illness, exacerbation or injury? Walker   Current Functional Level Cognition   Overall Cognitive Status: Within Functional Limits for tasks assessed Orientation Level: Oriented X4 General Comments: Pt anxious for mobility due to pain and BLE numbness; however willing to participate in all tasks asked of him    Extremity Assessment (includes Sensation/Coordination)   Upper Extremity Assessment: Overall WFL for tasks assessed (slight tremor noted after transitioning to sitting EOB; likely pain related)  Lower Extremity Assessment: Defer to PT evaluation RLE: Unable to fully assess due to pain RLE Sensation: decreased light touch RLE Coordination: decreased gross  motor LLE: Unable to fully assess due to pain LLE Sensation: decreased light touch LLE Coordination: decreased gross motor     ADLs   Overall ADL's : Needs assistance/impaired Eating/Feeding: Set up, Bed level Grooming: Set up, Sitting Grooming Details (indicate cue type and reason): pt able to complete bimanual groming tasks while sitting EOB this session Upper Body Bathing: Minimal assistance, Sitting Upper Body Bathing Details (indicate cue type and reason): incrased time and min A for trunk support Lower Body Bathing: Maximal assistance, +2 for physical assistance, +2 for safety/equipment, Sit to/from stand Upper Body Dressing : Moderate assistance, Sitting Upper Body Dressing Details (indicate cue type and reason): Mod A for tunk support in sitting, significant incrsaed time for pain management, total A for TLSO in sitting Lower Body Dressing: Total assistance, +2 for safety/equipment, +2 for physical assistance, Bed level, Sit to/from stand Toilet Transfer: Maximal assistance, +2 for physical assistance, +2 for safety/equipment, Squat-pivot Toilet Transfer Details (indicate cue type and reason): bed level; bed pan placement at this time due to pain with OOB transfers Toileting- Clothing Manipulation and Hygiene: +2 for physical assistance, +2 for safety/equipment, Maximal assistance, Sit to/from stand Functional mobility during ADLs: Maximal assistance, +2 for physical assistance, +2 for safety/equipment General ADL Comments: Pt is limited by pain, anxiety and BLE numbness/weakness     Mobility   Overal bed mobility: Needs Assistance Bed Mobility: Rolling, Sidelying to Sit, Sit to Sidelying Rolling: Mod assist Sidelying to sit: Max assist, +2 for physical assistance Supine to sit: Mod assist Sit to supine: Mod assist, +2 for physical assistance, +2 for safety/equipment Sit to sidelying: Mod assist, +2 for physical assistance General bed mobility comments: verbal cues to implament  back precautions     Transfers   Overall transfer level: Needs  assistance Equipment used: 2 person hand held assist Transfer via Chester: Stedy Transfers: Sit to/from Stand, Audiological scientist to Stand: From elevated surface, Max assist, +2 physical assistance  Lateral/Scoot Transfers: Max assist, +2 physical assistance General transfer comment: PT/OT assist with bilateral knee block and BUE support. Pt with knee buckling with any attempts at LE movement in standing. Pt does demonstrate ability to activate quads and hip extensors when verbally cues for posture.     Ambulation / Gait / Stairs / Wheelchair Mobility   Ambulation/Gait Ambulation/Gait assistance:  (unable to clear foot due to LE weakness and high falls risk) General Gait Details: unable     Posture / Balance Dynamic Sitting Balance Sitting balance - Comments: reliant on UE support of bed Balance Overall balance assessment: Needs assistance Sitting-balance support: Feet supported, Single extremity supported, Bilateral upper extremity supported Sitting balance-Leahy Scale: Poor Sitting balance - Comments: reliant on UE support of bed Postural control: Posterior lean Standing balance support: Bilateral upper extremity supported Standing balance-Leahy Scale: Zero Standing balance comment: maxA x 2 with BUE support and bilateral knee block     Special needs/care consideration Skin surgical incisions to back    Previous Home Environment (from acute therapy documentation) Living Arrangements: Spouse/significant other Available Help at Discharge: Available 24 hours/day Type of Home: House Home Layout: Two level, Able to live on main level with bedroom/bathroom Alternate Level Stairs-Rails: Right Alternate Level Stairs-Number of Steps: flight Home Access: Stairs to enter Entrance Stairs-Rails: Right, Can reach both Entrance Stairs-Number of Steps: approximately 14 total (6-7 steps followed by a walkway then  6-7 additional steps) Bathroom Shower/Tub: Chiropodist: Handicapped height Bathroom Accessibility: Yes How Accessible: Accessible via walker Home Care Services: No Additional Comments: pt's wife leaves the home to work sometimes, his nephew stays with him while she is out   Discharge Living Setting Plans for Discharge Living Setting: Patient's home, Lives with (comment) (spouse) Type of Home at Discharge: House Discharge Home Layout: Able to live on main level with bedroom/bathroom Discharge Home Access: Level entry (out the back door) Discharge Bathroom Shower/Tub: Tub/shower unit Discharge Bathroom Toilet: Handicapped height Discharge Bathroom Accessibility: Yes How Accessible: Accessible via walker Does the patient have any problems obtaining your medications?: No   Social/Family/Support Systems Patient Roles: Spouse Anticipated Caregiver: spouse, [name] Anticipated Ambulance person Information: [phone] Ability/Limitations of Caregiver: none Caregiver Availability: 24/7 Discharge Plan Discussed with Primary Caregiver: Yes Is Caregiver In Agreement with Plan?: Yes Does Caregiver/Family have Issues with Lodging/Transportation while Pt is in Rehab?: No   Goals Patient/Family Goal for Rehab: PT/OT supervision to min assist Expected length of stay: 18-21 days Additional Information: previously on SCI team Pt/Family Agrees to Admission and willing to participate: Yes Program Orientation Provided & Reviewed with Pt/Caregiver Including Roles  & Responsibilities: Yes  Barriers to Discharge: Insurance for SNF coverage   Decrease burden of Care through IP rehab admission: n/a   Possible need for SNF placement upon discharge: no   Patient Condition: I have reviewed medical records from [facility], spoken with CM, and patient and spouse. I met with patient at the bedside for inpatient rehabilitation assessment.  Patient will benefit from ongoing PT and OT,  can actively participate in 3 hours of therapy a day 5 days of the week, and can make measurable gains during the admission.  Patient will also benefit from the coordinated team approach during an Inpatient Acute Rehabilitation admission.  The patient will receive intensive therapy as well  as Rehabilitation physician, nursing, social worker, and care management interventions.  Due to safety, skin/wound care, disease management, medication administration, pain management, and patient education the patient requires 24 hour a day rehabilitation nursing.  The patient is currently max +2 with mobility and basic ADLs.  Discharge setting and therapy post discharge at home with home health is anticipated.  Patient has agreed to participate in the Acute Inpatient Rehabilitation Program and will admit today.   Preadmission Screen Completed By:  Michel Santee, PT, DPT 04/03/2021 10:08 AM ______________________________________________________________________   Discussed status with Dr. Ranell Patrick on 10:19 AM at 04/03/21 and received approval for admission today.   Admission Coordinator:  Michel Santee, PT, DPT time 10:19 AM Sudie Grumbling 04/03/21     Assessment/Plan: Diagnosis: Epidural abscess Does the need for close, 24 hr/day Medical supervision in concert with the patient's rehab needs make it unreasonable for this patient to be served in a less intensive setting? Yes Co-Morbidities requiring supervision/potential complications: vertebral osteomyelitis, empyema, sepsis secondary to E. Coli, class 3 obesity, OSA Due to bladder management, bowel management, safety, skin/wound care, disease management, medication administration, pain management, and patient education, does the patient require 24 hr/day rehab nursing? Yes Does the patient require coordinated care of a physician, rehab nurse, PT, OT to address physical and functional deficits in the context of the above medical diagnosis(es)? Yes Addressing deficits in  the following areas: balance, endurance, locomotion, strength, transferring, bowel/bladder control, bathing, dressing, feeding, grooming, toileting, and psychosocial support Can the patient actively participate in an intensive therapy program of at least 3 hrs of therapy 5 days a week? Yes The potential for patient to make measurable gains while on inpatient rehab is excellent Anticipated functional outcomes upon discharge from inpatient rehab: min assist PT, min assist OT, independent SLP Estimated rehab length of stay to reach the above functional goals is: 14 days Anticipated discharge destination: Home 10. Overall Rehab/Functional Prognosis: excellent     MD Signature: Leeroy Cha, MD

## 2021-04-03 NOTE — Progress Notes (Signed)
Physical Therapy Treatment Patient Details Name: Marco Kennedy MRN: [id number] DOB: October 02, 1950 Today's Date: 04/03/2021   History of Present Illness Pt is a 70 y.o. 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 He is now s/p T9-T10 laminectomy for decompression of thecal sac and debridement of abscess 9/18. Drain placed in left retroperitoneal space on 03/17/19 on bulb suction. Pt underwent T6-T11 PLIF on 03/31/2021. Pt had back sx  01/2020 with Dr. Venetia Maxon; admission 7/16-22 for sepsis and again 7/26 for SOB and fever. PMHx: back sx, arthritis, degenerative lumbar spinal stenosis, THA 2017.    PT Comments    Pt remains in signfiicant pain when mobilizing at this time, despite pre-medication with IV pain meds. Pt demonstrates significant weakness in LE and core, needing significant physical assistance to perform all functional mobility tasks at this time. Pt will benefit from continued aggressive mobilization to aide in improving strength and reducing falls risk. PT recommends CIR placement at this time.   Recommendations for follow up therapy are one component of a multi-disciplinary discharge planning process, led by the attending physician.  Recommendations may be updated based on patient status, additional functional criteria and insurance authorization.  Follow Up Recommendations  CIR;Supervision/Assistance - 24 hour     Equipment Recommendations  Wheelchair (measurements PT);Wheelchair cushion (measurements PT);Hospital bed;Other (comment)    Recommendations for Other Services       Precautions / Restrictions Precautions Precautions: Fall;Back Precaution Booklet Issued: No Precaution Comments: pt able to verbalize 3/3 back precautions, requires cues for implementation during session Required Braces or Orthoses: Spinal Brace Spinal Brace: Thoracolumbosacral orthotic;Applied in sitting position Restrictions Weight Bearing  Restrictions: No     Mobility  Bed Mobility Overal bed mobility: Needs Assistance Bed Mobility: Rolling;Sidelying to Sit;Sit to Sidelying Rolling: Mod assist Sidelying to sit: Max assist;+2 for physical assistance     Sit to sidelying: Max assist;+2 for physical assistance      Transfers Overall transfer level: Needs assistance Equipment used: 2 person hand held assist (attempted 2 stands with STEDY however unable to extend knees/hips, only clearing buttocks from bed by a few inches) Transfers: Sit to/from Stand Sit to Stand: Max assist;+2 physical assistance;From elevated surface        Lateral/Scoot Transfers: Max assist;+2 physical assistance (sliding laterally toward head of bed) General transfer comment: PT/OT provide bilateral knee buckle and UE support to aide in knee extension, verbal and tactile cues to facilitate hip extension. Pt is able to laterally scoot by pushing with BUEs and with therapist assistance on bed pad  Ambulation/Gait                 Stairs             Wheelchair Mobility    Modified Rankin (Stroke Patients Only)       Balance Overall balance assessment: Needs assistance Sitting-balance support: Single extremity supported;Bilateral upper extremity supported;Feet supported Sitting balance-Leahy Scale: Poor Sitting balance - Comments: reliant on UE support and minG Postural control: Posterior lean Standing balance support: Bilateral upper extremity supported Standing balance-Leahy Scale: Zero Standing balance comment: maxA x 2 with knee block, stands for ~15-20 seconds                            Cognition Arousal/Alertness: Awake/alert Behavior During Therapy: WFL for tasks assessed/performed;Anxious Overall Cognitive Status: Within Functional Limits for tasks assessed  Exercises      General Comments General comments (skin integrity, edema, etc.): VSS on  RA, son arrives during session      Pertinent Vitals/Pain Pain Assessment: Faces Faces Pain Scale: Hurts whole lot Pain Location: back Pain Descriptors / Indicators: Grimacing Pain Intervention(s): Premedicated before session    Home Living                      Prior Function            PT Goals (current goals can now be found in the care plan section) Acute Rehab PT Goals Patient Stated Goal: less pain Progress towards PT goals: Not progressing toward goals - comment (pain limiting)    Frequency    Min 3X/week      PT Plan Current plan remains appropriate    Co-evaluation PT/OT/SLP Co-Evaluation/Treatment: Yes Reason for Co-Treatment: Complexity of the patient's impairments (multi-system involvement);For patient/therapist safety;To address functional/ADL transfers PT goals addressed during session: Mobility/safety with mobility;Balance;Strengthening/ROM;Proper use of DME        AM-PAC PT "6 Clicks" Mobility   Outcome Measure  Help needed turning from your back to your side while in a flat bed without using bedrails?: A Lot Help needed moving from lying on your back to sitting on the side of a flat bed without using bedrails?: Total Help needed moving to and from a bed to a chair (including a wheelchair)?: Total Help needed standing up from a chair using your arms (e.g., wheelchair or bedside chair)?: Total Help needed to walk in hospital room?: Total Help needed climbing 3-5 steps with a railing? : Total 6 Click Score: 7    End of Session Equipment Utilized During Treatment: Back brace Activity Tolerance: Patient limited by pain Patient left: in bed;with call bell/phone within reach;with bed alarm set;with family/visitor present Nurse Communication: Mobility status;Need for lift equipment PT Visit Diagnosis: Muscle weakness (generalized) (M62.81);Difficulty in walking, not elsewhere classified (R26.2) Pain - Right/Left: Left Pain - part of body:   (back)     Time: 6979-4801 PT Time Calculation (min) (ACUTE ONLY): 37 min  Charges:  $Therapeutic Activity: 8-22 mins                     Arlyss Gandy, PT, DPT Acute Rehabilitation Pager: [phone]    Arlyss Gandy 04/03/2021, 11:39 AM

## 2021-04-03 NOTE — Progress Notes (Signed)
PICC dc'd. Hemovac removed and honeycomb dressing changed. No drainage or adverse effects noted. Pt transferred to CIR (4W) with all personal belongings. All questions and concerns addressed.

## 2021-04-03 NOTE — Plan of Care (Signed)
Patient alert and oriented X4, respiration even and unlabored on room air. Pain managed with prn oxy X2 and robaxin X1 overnight. Pt had a large BM earlier in the shift.   Accordion drain with 30 ml of serosanguinous output thus far into the shift. Marland Kitchen

## 2021-04-03 NOTE — Progress Notes (Signed)
[name], male   DOB: [birth year], 70 y.o.   MRN: [id number]  Patient admitted to CIR this afternoon. Complains of pain when moving, PRN pain medication working. Call light and personal belonging within reach. Vic Ripper

## 2021-04-03 NOTE — Discharge Summary (Signed)
Name: Marco Kennedy MRN: [id number] DOB: [birth year] 70 y.o. PCP: Maximiano Coss, MD  Date of Admission: 03/14/2021  1:30 PM Date of Discharge: 04/03/2021 Attending Physician: Gust Rung, DO  Discharge Diagnosis: 1. Epidural abscess, retroperitoneal fluid collection 2. Hypertension 3. Constipation  Discharge Medications: Allergies as of 04/03/2021   No Known Allergies      Medication List     STOP taking these medications    ceFAZolin  IVPB Commonly known as: ANCEF   ceFAZolin 2-4 GM/100ML-% IVPB Commonly known as: ANCEF   colchicine 0.6 MG tablet       TAKE these medications    acetaminophen 325 MG tablet Commonly known as: TYLENOL Take 2 tablets (650 mg total) by mouth every 6 (six) hours as needed for mild pain (or Fever >/= 101).   alum & mag hydroxide-simeth 200-200-20 MG/5ML suspension Commonly known as: MAALOX/MYLANTA Take 30 mLs by mouth every 6 (six) hours as needed for indigestion.   amLODipine 5 MG tablet Commonly known as: NORVASC Take 1 tablet (5 mg total) by mouth daily.   amoxicillin 500 MG capsule Commonly known as: AMOXIL Take 1 capsule (500 mg total) by mouth every 8 (eight) hours.   bisacodyl 10 MG suppository Commonly known as: DULCOLAX Place 1 suppository (10 mg total) rectally daily as needed for moderate constipation.   cholecalciferol 25 MCG (1000 UNIT) tablet Commonly known as: VITAMIN D3 Take 1 tablet (1,000 Units total) by mouth daily.   diclofenac 75 MG EC tablet Commonly known as: VOLTAREN Take 1 tablet (75 mg total) by mouth 2 (two) times daily.   docusate sodium 100 MG capsule Commonly known as: COLACE Take 1 capsule (100 mg total) by mouth 2 (two) times daily. What changed: when to take this   lubiprostone 24 MCG capsule Commonly known as: AMITIZA Take 1 capsule (24 mcg total) by mouth 2 (two) times daily with a meal.   methocarbamol 500 MG tablet Commonly known as: ROBAXIN Take 1 tablet (500 mg  total) by mouth every 6 (six) hours as needed for muscle spasms.   multivitamin with minerals Tabs tablet Take 1 tablet by mouth daily.   oxyCODONE 5 MG immediate release tablet Commonly known as: Oxy IR/ROXICODONE Take 1 tablet (5 mg total) by mouth every 4 (four) hours as needed for moderate pain. What changed:  when to take this reasons to take this   Oxycodone HCl 10 MG Tabs Take 1 tablet (10 mg total) by mouth every 4 (four) hours as needed for severe pain. What changed: You were already taking a medication with the same name, and this prescription was added. Make sure you understand how and when to take each.   pantoprazole 40 MG tablet Commonly known as: PROTONIX Take 1 tablet (40 mg total) by mouth at bedtime.   polyethylene glycol 17 g packet Commonly known as: MIRALAX / GLYCOLAX Take 17 g by mouth daily as needed for mild constipation. What changed: You were already taking a medication with the same name, and this prescription was added. Make sure you understand how and when to take each.   polyethylene glycol 17 g packet Commonly known as: MIRALAX / GLYCOLAX Take 17 g by mouth daily. Start taking on: April 04, 2021 What changed: when to take this   senna-docusate 8.6-50 MG tablet Commonly known as: Senokot-S Take 2 tablets by mouth 2 (two) times daily.   zolpidem 5 MG tablet Commonly known as: AMBIEN Take 1 tablet (5 mg total)  by mouth at bedtime as needed for sleep.        Disposition and follow-up:   MarcoCarney E Kennedy was discharged from [facility] in Good condition.  At the hospital follow up visit please address:  1.  Epidural abscess/peritoneal fluid collection: Pt's picc line was removed on day of discharge, and pt was discharged on amoxcillin 500 mg po q8h, to continue for 4 weeks. Pt will also follow-up in the ID clinic   2.  Labs / imaging needed at time of follow-up: None  3.  Pending labs/ test needing follow-up:  None  Follow-up Appointments:  Follow-up Information     [facility]. Schedule an appointment as soon as possible for a visit in 2 week(s).   Specialty: Infectious Diseases Contact information: [address] [place], [address] [id number] [place] [address] [phone]        Maximiano Coss, MD. Schedule an appointment as soon as possible for a visit.   Specialty: Family Medicine Why: Follow-up with your primary care doctor within 1 week after discharge from inpatient rehab. Contact information: [facility] [address] [place] [address] [phone]         [facility]. Schedule an appointment as soon as possible for a visit in 1 week(s).   Specialty: Neurosurgery Why: Follow-up with Neurosurgery within 1-2 weeks after discharge from inpatient rehab Contact information: [address] [place] [address] [phone]                 Hospital Course by problem list: 1. #Epidural abscess/Retroperitoneal fluid collection Patient presented to the ED after a follow-up MRI for epidural abscess around hardware in his thoracic and lumbar spines, as well as previously noted retroperitoneal fluid collection.  During the MRI, the patient experienced acute worsening of low back pain that radiated to his lower extremities.  MRI was read and showed worsening findings of discitis/osteomyelitis at T9-10 and T10-11 with enlargement of epidural abscess at these levels resulting in severe spinal stenosis at T10-11 with no cord edema, persistent extensive paravertebral inflammation/phlegmon, decreased size of loculated bilateral pleural collections, prompting patient to present to ED.  At that time he did have a leukocytosis of 12.8.  Blood cultures were drawn and showed no growth.  He had been on IV cefazolin, however this was brought into  vancomycin and cefepime on admission.  Neurosurgery did take him to the OR for T9-T10 laminectomy debridement of abscess on 9/17.  Cultures collected at that time showed no growth.  IR was consulted for drainage of the left retroperitoneal fluid collection noted on imaging and drained 20 mL of serous aspirate on 9/19.  At this time, drainage catheter was placed.  Cultures were collected on this fluid as well and showed no growth.  On 9/19, the patient was transitioned to ertapenem 1 g IM daily.  This was further modified on 9/22 by stopping ertapenem and starting cefazolin 2 g IV 3 times daily.  On 9/23, he was initiated on Lovenox 40 mg injection.  On 9/24, a repeat MRI showed postoperative changes from interval decompressive laminectomy at T9 and T10 with improved stenosis at these levels, persistent severe spinal stenosis just below the operative site at T10-11 with moderate cord flattening, persistent osteomyelitis discitis at T9-10 and T10-11, slightly decreased in size of ventral epidural abscess extending from T8-T11, persistent paravertebral inflammation/phlegmon with persistent irregular  loculated bilateral pleural collections; repeat CT abdomen/pelvis showed left pelvic collection that had been decompressed with percutaneous pigtail catheter, adjacent inflammatory changes decreased from previous imaging, no acute abnormalities within the abdomen or pelvis.  Drain catheter was removed 9/26.  On 9/27 he was reevaluated for procedure timeline and was scheduled for repeat surgery on the thoracic spine on 03/31/2021. He underwent this procedure on 10/4 without any complications or issues. Postoperatively, the patient did well and endorsed adequate pain control. He was transitioned from IV cefazolin to PO amoxicillin 500 mg q8h on 10/6 per ID recs, and he was discharged on this regimen (to continue for 4 weeks). His picc line was also removed on day of discharge to CIR.  #Hypertension Chronically managed with  amlodipine 5 mg p.o. daily at home. This was held until 9/20.  #Constipation Patient was unable to have bowel movements throughout his admission.  Bowel regimen included MiraLAX, Senokot 2 tablets daily.  He was given 1 dose of milk of magnesia 15 mL on 9/23.  On 9/24 Dulcolax 10 mg daily was added to his regimen.  On 9/25, lactulose enema was added to this regimen.  This was advanced further with bisacodyl suppository on 9/26 which allowed very minimal bowel movement that day.  He was continued on intensive bowel regimen and began having bowel movements on 9/27. This was advanced further on 10/2 (from senna-docusate 2 tablets daily to BID). Pt endorsed having adequate BM's on this regimen, specifically having success with his suppositories.  Discharge Exam:   BP 124/72 (BP Location: Left Arm)   Pulse (!) 110   Temp 98.5 F (36.9 C) (Oral)   Resp 20   Ht 5\' 7"  (1.702 m)   Wt 106.1 kg   SpO2 97%   BMI 36.65 kg/m  Discharge exam:  Constitutional: Elderly gentleman lying in bed.  No acute distress noted. Cardio: Regular rhythm. No murmurs, rubs, gallops. Pulm: Clear to auscultation bilaterally. Normal work of breathing on room air. Abdomen: Soft, nontender, nondistended. MSK: Negative for bilateral upper and lower extremity edema. Skin: Skin is warm and dry. Neurologic exam: Mental status: A&Ox3. No new focal deficits. Psychiatric: Normal mood and affect  Pertinent Labs, Studies, and Procedures:  CBC    Component Value Date/Time   WBC 15.5 (H) 04/03/2021 0439   RBC 3.10 (L) 04/03/2021 0439   HGB 8.4 (L) 04/03/2021 0439   HCT 26.8 (L) 04/03/2021 0439   PLT 349 04/03/2021 0439   MCV 86.5 04/03/2021 0439   MCH 27.1 04/03/2021 0439   MCHC 31.3 04/03/2021 0439   RDW 14.7 04/03/2021 0439   LYMPHSABS 1.5 03/23/2021 0555   MONOABS 0.8 03/23/2021 0555   EOSABS 0.4 03/23/2021 0555   BASOSABS 0.1 03/23/2021 0555    BMP Latest Ref Rng & Units 04/03/2021 04/02/2021 04/01/2021  Glucose 70 -  99 mg/dL 06/01/2021) 485(I) 627(O)  BUN 8 - 23 mg/dL 5(L) 16 14  Creatinine 0.61 - 1.24 mg/dL 350(K) 9.38(H 8.29  Sodium 135 - 145 mmol/L 130(L) 131(L) 132(L)  Potassium 3.5 - 5.1 mmol/L 3.8 4.6 5.2(H)  Chloride 98 - 111 mmol/L 96(L) 98 98  CO2 22 - 32 mmol/L 27 26 25   Calcium 8.9 - 10.3 mg/dL 9.37) ) 1.6(R)   DG Thoracic Spine 2 View  Result Date: 03/14/2021 CLINICAL DATA:  T9-10 laminectomy EXAM: THORACIC SPINE 2 VIEWS COMPARISON:  Thoracic spine MRI 03/14/2021 FINDINGS: Based on numbering from the recent MRI that shows the most superior level of thoracic spine hardware  to be T10, the third provided image shows a probe with its tip in line with the T8-9 disc space. The more proximal portion may be abutting the underside of the T9 lamina, but visualization is obscured by the tissue spreader. IMPRESSION: Probe tip at the T8-9 disc space. Electronically Signed   By: Deatra Robinson M.D.   On: 03/14/2021 21:05   MR THORACIC SPINE WO CONTRAST  Result Date: 03/14/2021 CLINICAL DATA:  Follow-up epidural abscess. EXAM: MRI THORACIC SPINE WITHOUT CONTRAST TECHNIQUE: Multiplanar, multisequence MR imaging of the thoracic spine was performed. No intravenous contrast was administered. COMPARISON:  01/28/2021 FINDINGS: Alignment: Mildly increased kyphosis in the lower thoracic spine. Minimal retrolisthesis of T11 on T12 and T12 on L1. Vertebrae: Posterior spinal fusion instrumentation is again partially visualized beginning at T10 and extending inferiorly into the lumbar spine. There is progressive, extensive marrow edema throughout the T9, T10, and T11 vertebral bodies. This is associated with increased fluid signal in the T10-11 greater than T9-10 disc spaces with associated endplate erosion, most notable at T11 where there is moderate to severe anterior vertebral body height loss. There is a persistent ventral epidural fluid collection which does not extend as far cranially as on the prior MRI (currently T8 and  previously T7), however the overall size of the current collection at the T9 and T10 levels is larger than on the prior study and results in increased, moderate spinal stenosis at T9-10 and severe spinal stenosis at T10-11 with moderate cord flattening. Cord:  No definite cord edema. Paraspinal and other soft tissues: Persistent extensive paravertebral inflammation/phlegmon in the lower thoracic spine. Decreased size of loculated bilateral pleural collections. Disc levels: Postoperative an infectious findings in the lower thoracic spine as detailed above. Minor spondylosis and moderate facet arthrosis more superiorly in the thoracic spine without associated spinal stenosis. IMPRESSION: 1. Worsening findings of discitis-osteomyelitis at T9-10 and T10-11 with enlargement of the epidural abscess at these levels resulting in severe spinal stenosis at T10-11. No cord edema. 2. Persistent extensive paravertebral inflammation/phlegmon. Decreased size of loculated bilateral pleural collections. These results will be called to the ordering clinician or representative by the Radiologist Assistant, and communication documented in the PACS or [employer]. Electronically Signed   By: Sebastian Ache M.D.   On: 03/14/2021 12:15   CT ABDOMEN PELVIS W CONTRAST  Result Date: 03/14/2021 CLINICAL DATA:  Follow-up for epidural abscess. EXAM: CT ABDOMEN AND PELVIS WITH CONTRAST TECHNIQUE: Multidetector CT imaging of the abdomen and pelvis was performed using the standard protocol following bolus administration of intravenous contrast. CONTRAST:  OMNIPAQUE IOHEXOL 350 MG/ML SOLN COMPARISON:  MRI thoracic spine 03/14/2021. CT abdomen and pelvis 02/16/2021. FINDINGS: Lower chest: Again seen are small bilateral pleural effusions similar to the prior study. Loculated pleural fluid with imperceptible wall in the right lower hemithorax has decreased in size now measuring 2.1 x 3.0 cm. Air is no longer seen within this collection.  Hepatobiliary: No focal liver abnormality is seen. No gallstones, gallbladder wall thickening, or biliary dilatation. Pancreas: Unremarkable. No pancreatic ductal dilatation or surrounding inflammatory changes. Spleen: Normal in size without focal abnormality. Adrenals/Urinary Tract: The bilateral kidneys are within normal limits. There is no hydronephrosis. Bladder is decompressed and not well evaluated secondary to streak artifact in the pelvis, but grossly within normal limits. The adrenal glands are within normal limits. Stomach/Bowel: Stomach is within normal limits. Appendix appears normal. No evidence of bowel wall thickening, distention, or inflammatory changes. There is a small hiatal hernia,  unchanged. Vascular/Lymphatic: No significant vascular findings are present. No enlarged abdominal or pelvic lymph nodes. Reproductive: Prostate is unremarkable. Other: There is no ascites. There is a small fat containing umbilical hernia. Pelvic percutaneous drainage catheter has been removed in the interval. Lower left retroperitoneal fluid collection is again seen measuring 5.5 x 2.3 by 3.4 cm. This has slightly increased in size in the interval. There is no air within this collection. Musculoskeletal: There is subcutaneous posterior lumbar scarring without discrete fluid collection identified. T10-S1 posterior fusion hardware is again noted. Endplate changes at T9-T10 and T10-T11 correspond to patient's known osteomyelitis/discitis seen on recent MRI. There is lucency surrounding the pedicle screws at T10 similar to the prior study. There is perivertebral soft tissue thickening at these infected levels without discrete new drainable fluid collection. Left hip arthroplasty is present. IMPRESSION: 1. 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 There is stable lucency surrounding the T10 pedicle screws likely secondary to infection. There is paravertebral soft tissue  thickening without discrete fluid collection. 2. Left pelvic fluid collection has increased in size. The drain has been removed in the interval. Correlate clinically for abscess. 3. Loculated pleural fluid collection in the right lower hemithorax has slightly decreased in size, but has not resolved. Findings are likely related to patient's known empyema. Electronically Signed   By: Darliss Cheney M.D.   On: 03/14/2021 17:34     Discharge Instructions: Discharge Instructions     Call MD for:  difficulty breathing, headache or visual disturbances   Complete by: As directed    Call MD for:  redness, tenderness, or signs of infection (pain, swelling, redness, odor or green/yellow discharge around incision site)   Complete by: As directed    Call MD for:  severe uncontrolled pain   Complete by: As directed    Call MD for:  temperature >100.4   Complete by: As directed    No wound care   Complete by: As directed        Signed: Andrey Campanile, MD 04/03/2021, 11:32 AM   Pager: [phone]

## 2021-04-03 NOTE — Progress Notes (Signed)
Subjective: Patient reports " The left side (back) is hurting more today"  Objective: Vital signs in last 24 hours: Temp:  [98.1 F (36.7 C)-99.3 F (37.4 C)] 99.3 F (37.4 C) (10/07 0743) Pulse Rate:  [91-105] 103 (10/07 0743) Resp:  [17-19] 18 (10/07 0743) BP: (127-141)/(72-79) 141/79 (10/07 0743) SpO2:  [96 %-100 %] 96 % (10/07 0743)  Intake/Output from previous day: 10/06 0701 - 10/07 0700 In: 2005.3 [P.O.:320; I.V.:1685.3] Out: 1215 [Urine:1125; Drains:90] Intake/Output this shift: No intake/output data recorded.  Working with OT at edge of bed. TLSO in use. Back pain controlled with current med regimen. Hemovac patent, bloody drainage, ~37ml overnight.   Lab Results: Recent Labs    04/02/21 0319 04/03/21 0439  WBC 9.8 15.5*  HGB 8.4* 8.4*  HCT 26.9* 26.8*  PLT 352 349   BMET Recent Labs    04/02/21 0319 04/03/21 0439  NA 131* 130*  K 4.6 3.8  CL 98 96*  CO2 26 27  GLUCOSE 140* 132*  BUN 16 5*  CREATININE 0.65 0.47*  CALCIUM 8.4* 8.4*    Studies/Results: DG CHEST PORT 1 VIEW  Result Date: 04/01/2021 CLINICAL DATA:  Evaluate PICC line position. EXAM: PORTABLE CHEST 1 VIEW COMPARISON:  01/23/2021 FINDINGS: The cardiac silhouette, mediastinal and hilar contours are within normal limits. The right PICC line is partially obscured by the thoracic spine hardware. Difficult to identify the distal tip. The last place I can definitively see it is just above the right posterior rod which is proximal to mid SVC. Small bilateral pleural effusions and bibasilar scarring. No infiltrates or edema. IMPRESSION: Right PICC line tip is difficult to identify but likely in the proximal to mid SVC. Electronically Signed   By: Rudie Meyer M.D.   On: 04/01/2021 14:28    Assessment/Plan: improving  LOS: 19 days  Awaits CIR. OK per DrStern to d/c hemovac. Order entered.    Marco Kennedy 04/03/2021, 10:43 AM

## 2021-04-03 NOTE — H&P (Signed)
Physical Medicine and Rehabilitation Admission H&P  HPI: Marco Kennedy is a 70 year old right-handed male with history of obesity, OSA maintained on CPAP and recent admission 01/10/2021 - 01/16/2021 for sepsis secondary to moderate-sized abscess near the pelvic hardware.  Neurosurgery interventional radiology and infectious disease were involved in his care at that point underwent wound debridement with neurosurgery on 7/19 with E. coli from wound cultures.  He was given cefazolin 1 with plan of 6 weeks of treatment.  He was discharged to home noted bilateral rib pain needing to sleep in a lift chair.  Home health nurse 7/25 to assess the PICC line recommended follow-up PCP they decided to try telehealth but by that time patient was noted to have some coughing and nonspecific chest discomfort.  He developed low-grade fevers and was admitted 01/20/2021.  He did undergo bilateral pigtail catheters placed in pleural cavities.  Neurosurgery follow-up in regards to osteomyelitis no current plan for repeat surgical intervention at that time he was maintained on cefazolin and most recent blood cultures negative.  He was admitted to inpatient rehab services 02/03/2021 - 02/16/2021 for vertebral osteomyelitis.  He was discharged home ambulating 100 feet contact-guard assist.  Presented 03/14/2021 for worsening discitis as well as enlarging epidural abscess T9-T10 resulting in spinal stenosis.  Patient underwent emergent T9-T10 laminectomy for decompression of thecal sac 03/14/2021 per Dr. Conchita Paris.  Hospital course followed by neurosurgery Dr. Venetia Maxon patient returned to the OR for reconstruction and correction of thoracic kyphosis and decompression of thoracic spinal cord 03/31/2021 undergoing T6-T9 posterior decompression and extension of fusion to thoracic 11, decompression of spinal cord dura T9-T11 laminectomy, posterior lateral arthrodesis T6-T11 levels as well as placement of Jackson-Pratt drain.  TLSO back brace  when out of bed only applied in sitting position.  Infectious disease consulted he has been transitioned from intravenous cefazolin to p.o. amoxicillin.  Maintained on subcutaneous Lovenox for DVT prophylaxis.  Therapy evaluations completed due to patient decreased functional mobility was admitted for a comprehensive rehab program. He complains of severe pain currently while working with therapy.   Review of Systems  Constitutional:  Positive for fever and malaise/fatigue. Negative for chills.  HENT:  Negative for hearing loss.   Eyes:  Negative for blurred vision and double vision.  Respiratory:  Positive for cough and shortness of breath.   Cardiovascular:  Positive for leg swelling. Negative for chest pain and palpitations.  Gastrointestinal:  Positive for constipation. Negative for heartburn, nausea and vomiting.  Genitourinary:  Positive for urgency. Negative for dysuria, flank pain and hematuria.  Musculoskeletal:  Positive for back pain, joint pain and myalgias.  Skin:  Negative for rash.  Neurological:  Positive for sensory change and weakness.  All other systems reviewed and are negative. Past Medical History:  Diagnosis Date   Arthritis    arthritis-bilateral knees, left Hip.   Degenerative lumbar spinal stenosis    EKG abnormality    06-08-16- being evaluated by cardiologist  in [place]   Idiopathic scoliosis of lumbar region    Joint stiffness    Knee pain    Leg swelling    Low back pain    Achy, shooting pain.  Hurts to Walk or stand up straight.   Lumbar radiculopathy    Scoliosis concern    Sleep apnea    Bipap use nightly 25/19 -3l/m oxygen   Spondylolisthesis of lumbar region    Transfusion history    [place] ,[place] after bleeding post colon polyp removal and  colonscopy procedure   Past Surgical History:  Procedure Laterality Date   ABDOMINAL EXPOSURE N/A 04/18/2020   Procedure: ABDOMINAL EXPOSURE;  Surgeon: [name], MD;  Location[facility] OR;   Service: Vascular;  Laterality: N/A;   ANTERIOR LAT LUMBAR FUSION Left 04/18/2020   Procedure: ANTERIOR LATERAL LUMBAR FUSION LUMBAR TWO-THREE, LUMBAR THREE-FOUR;  Surgeon: [name], MD;  Location: [facility] OR;  Service: Neurosurgery;  Laterality: Left;   ANTERIOR LUMBAR FUSION N/A 04/18/2020   Procedure: Lumbar Four-Five Lumbar Five- Sacral One Anterior lumbar Interbody Fusion;  Surgeon: [name], MD;  Location: [facility] OR;  Service: Neurosurgery;  Laterality: N/A;   APPLICATION OF INTRAOPERATIVE CT SCAN N/A 04/21/2020   Procedure: APPLICATION OF INTRAOPERATIVE CT SCAN;  Surgeon: [name], MD;  Location: [facility] OR;  Service: Neurosurgery;  Laterality: N/A;   BACK SURGERY     lumbar disc surgery   COLONOSCOPY W/ POLYPECTOMY     HERNIA REPAIR Left    left inguinal hernia repair   IR RADIOLOGIST EVAL & MGMT  02/24/2021   KNEE ARTHROSCOPY Right 2003    -scope for meniscus   LUMBAR WOUND DEBRIDEMENT N/A 01/13/2021   Procedure: THORACOLUMBAR WOUND DEBRIDEMENT;  Surgeon: [name], MD;  Location: [facility] OR;  Service: Neurosurgery;  Laterality: N/A;   LUMBAR WOUND DEBRIDEMENT N/A 03/14/2021   Procedure: WOUND EXPLORATION AND DEBRIDEMENT OF ABSCESS;  Surgeon: Lisbeth Renshaw, MD;  Location: [facility] OR;  Service: Neurosurgery;  Laterality: N/A;   POSTERIOR LUMBAR FUSION 4 LEVEL N/A 04/21/2020   Procedure: Thoracic ten to pelvis pedicle screw fixation with osteotomies;  Surgeon: [name], MD;  Location: [facility] OR;  Service: Neurosurgery;  Laterality: N/A;   POSTERIOR LUMBAR FUSION 4 LEVEL N/A 03/31/2021   Procedure: Thoracic six to Thoracic nine  Posterior decompression/Fusion with removal hardware thoracic ten;  Surgeon: [name], MD;  Location: [facility] OR;  Service: Neurosurgery;  Laterality: N/A;   THORACIC LAMINECTOMY FOR EPIDURAL ABSCESS N/A 03/14/2021   Procedure: THORACIC LAMINECTOMY, THORACIC NINE-THORACIC TEN;  Surgeon: Lisbeth Renshaw, MD;  Location: [facility] OR;  Service: Neurosurgery;  Laterality:  N/A;   TOTAL HIP ARTHROPLASTY Left 06/15/2016   Procedure: LEFT TOTAL HIP ARTHROPLASTY ANTERIOR APPROACH;  Surgeon: [name], MD;  Location: [facility] ORS;  Service: Orthopedics;  Laterality: Left;   Family History  Problem Relation Age of Onset   Hypertension Mother    Arthritis Mother    Stomach cancer Father    Hypertension Brother    Social History:  reports that he has never smoked. He has never used smokeless tobacco. He reports that he does not currently use alcohol. He reports that he does not use drugs. Allergies: No Known Allergies Medications Prior to Admission  Medication Sig Dispense Refill   acetaminophen (TYLENOL) 325 MG tablet Take 2 tablets (650 mg total) by mouth every 6 (six) hours as needed for mild pain (or Fever >/= 101).     alum & mag hydroxide-simeth (MAALOX/MYLANTA) 200-200-20 MG/5ML suspension Take 30 mLs by mouth every 6 (six) hours as needed for indigestion. 355 mL 0   amLODipine (NORVASC) 5 MG tablet Take 1 tablet (5 mg total) by mouth daily. 30 tablet 0   amoxicillin (AMOXIL) 500 MG capsule Take 1 capsule (500 mg total) by mouth every 8 (eight) hours for 27 days.     bisacodyl (DULCOLAX) 10 MG suppository Place 1 suppository (10 mg total) rectally daily as needed for moderate constipation. 12 suppository 0   cholecalciferol (VITAMIN D3) 25 MCG (1000 UNIT) tablet  Take 1 tablet (1,000 Units total) by mouth daily. 30 tablet 0   diclofenac (VOLTAREN) 75 MG EC tablet Take 1 tablet (75 mg total) by mouth 2 (two) times daily.     docusate sodium (COLACE) 100 MG capsule Take 1 capsule (100 mg total) by mouth 2 (two) times daily. 10 capsule 0   lubiprostone (AMITIZA) 24 MCG capsule Take 1 capsule (24 mcg total) by mouth 2 (two) times daily with a meal.     methocarbamol (ROBAXIN) 500 MG tablet Take 1 tablet (500 mg total) by mouth every 6 (six) hours as needed for muscle spasms.     Multiple Vitamin (MULTIVITAMIN WITH MINERALS) TABS tablet Take 1 tablet by mouth daily.      oxyCODONE (OXY IR/ROXICODONE) 5 MG immediate release tablet Take 1 tablet (5 mg total) by mouth every 4 (four) hours as needed for moderate pain. 30 tablet 0   oxyCODONE 10 MG TABS Take 1 tablet (10 mg total) by mouth every 4 (four) hours as needed for severe pain. 30 tablet 0   pantoprazole (PROTONIX) 40 MG tablet Take 1 tablet (40 mg total) by mouth at bedtime.     [START ON 04/04/2021] polyethylene glycol (MIRALAX / GLYCOLAX) 17 g packet Take 17 g by mouth daily. 14 each 0   polyethylene glycol (MIRALAX / GLYCOLAX) 17 g packet Take 17 g by mouth daily as needed for mild constipation. 14 each 0   senna-docusate (SENOKOT-S) 8.6-50 MG tablet Take 2 tablets by mouth 2 (two) times daily.     zolpidem (AMBIEN) 5 MG tablet Take 1 tablet (5 mg total) by mouth at bedtime as needed for sleep. 30 tablet 0    Drug Regimen Review Drug regimen was reviewed and remains appropriate with no significant issues identified   Home: Home Living Family/patient expects to be discharged to:: Private residence Living Arrangements: Spouse/significant other Available Help at Discharge: Available 24 hours/day Type of Home: House Home Access: Stairs to enter [employer] of Steps: approximately 14 total (6-7 steps followed by a walkway then 6-7 additional steps) Entrance Stairs-Rails: Right, Can reach both Home Layout: Two level, Able to live on main level with bedroom/bathroom Alternate Level Stairs-Number of Steps: flight Alternate Level Stairs-Rails: Right Bathroom Shower/Tub: Engineer, manufacturing systems: Handicapped height Bathroom Accessibility: Yes Home Equipment: Information systems manager, Bedside commode, Environmental consultant - 2 wheels, Cane - single point, Environmental consultant - 4 wheels, Other (comment), Toilet riser, Wheelchair - manual Additional Comments: pt's wife leaves the home to work sometimes, his nephew stays with him while she is out   Functional History: Prior Function Level of Independence: Needs assistance Gait  / Transfers Assistance Needed: Was able to walk 50-100 ft without AD, used cane for longer distances, but uses RW on uneven surfaces. Pt was working with PT to incrase distances and decrease fall risk ADL's / Homemaking Assistance Needed: Wife assists in dressing and bathing bottom half. Wife does cooking and cleaning. Pt manages own meds and finances. 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   Functional Status:  Mobility: Bed Mobility Overal bed mobility: Needs Assistance Bed Mobility: Rolling, Sidelying to Sit, Sit to Sidelying Rolling: Mod assist Sidelying to sit: Max assist, +2 for physical assistance Supine to sit: Mod assist Sit to supine: Mod assist, +2 for physical assistance, +2 for safety/equipment Sit to sidelying: Mod assist, +2 for physical assistance General bed mobility comments: verbal cues to implament back precautions Transfers Overall transfer  level: Needs assistance Equipment used: 2 person hand held assist Transfer via Lift Equipment: Stedy Transfers: Sit to/from Stand, Astronomer to Stand: From elevated surface, Max assist, +2 physical assistance  Lateral/Scoot Transfers: Max assist, +2 physical assistance General transfer comment: PT/OT assist with bilateral knee block and BUE support. Pt with knee buckling with any attempts at LE movement in standing. Pt does demonstrate ability to activate quads and hip extensors when verbally cues for posture. Ambulation/Gait Ambulation/Gait assistance:  (unable to clear foot due to LE weakness and high falls risk) General Gait Details: unable   ADL: ADL Overall ADL's : Needs assistance/impaired Eating/Feeding: Set up, Bed level Grooming: Set up, Sitting Grooming Details (indicate cue type and reason): pt able to complete bimanual groming tasks while sitting EOB this session Upper Body Bathing: Minimal assistance, Sitting Upper Body Bathing  Details (indicate cue type and reason): incrased time and min A for trunk support Lower Body Bathing: Maximal assistance, +2 for physical assistance, +2 for safety/equipment, Sit to/from stand Upper Body Dressing : Moderate assistance, Sitting Upper Body Dressing Details (indicate cue type and reason): Mod A for tunk support in sitting, significant incrsaed time for pain management, total A for TLSO in sitting Lower Body Dressing: Total assistance, +2 for safety/equipment, +2 for physical assistance, Bed level, Sit to/from stand Toilet Transfer: Maximal assistance, +2 for physical assistance, +2 for safety/equipment, Squat-pivot Toilet Transfer Details (indicate cue type and reason): bed level; bed pan placement at this time due to pain with OOB transfers Toileting- Clothing Manipulation and Hygiene: +2 for physical assistance, +2 for safety/equipment, Maximal assistance, Sit to/from stand Functional mobility during ADLs: Maximal assistance, +2 for physical assistance, +2 for safety/equipment General ADL Comments: Pt is limited by pain, anxiety and BLE numbness/weakness   Cognition: Cognition Overall Cognitive Status: Within Functional Limits for tasks assessed Orientation Level: Oriented X4 Cognition Arousal/Alertness: Awake/alert Behavior During Therapy: WFL for tasks assessed/performed, Anxious Overall Cognitive Status: Within Functional Limits for tasks assessed General Comments: Pt anxious for mobility due to pain and BLE numbness; however willing to participate in all tasks asked of him  Physical Exam: There were no vitals taken for this visit. Physical Exam Gen: no distress, normal appearing HEENT: oral mucosa pink and moist, NCAT Cardio: Reg rate Chest: normal effort, normal rate of breathing Abd: soft, non-distended Ext: no edema Psych: pleasant, normal affect Skin:    Comments: Surgical site is dressed.  JP drain currently remains in place and plan is to be removed 04/03/2021   Neurological/MSK    Comments: Patient is alert.  Sitting up in bed.  Makes eye contact with examiner.  Mood is a bit flat but appropriate.  Oriented to person place and time. TLSO in place. Hemovac in place. Moving all extremities but only 4-/5 strength in lowe extremities- currently severely limited by pain  Results for orders placed or performed during the hospital encounter of 03/14/21 (from the past 48 hour(s))  CBC     Status: Abnormal   Collection Time: 04/02/21  3:19 AM  Result Value Ref Range   WBC 9.8 4.0 - 10.5 K/uL   RBC 3.07 (L) 4.22 - 5.81 MIL/uL   Hemoglobin 8.4 (L) 13.0 - 17.0 g/dL   HCT 24.0 (L) 97.3 - 53.2 %   MCV 87.6 80.0 - 100.0 fL   MCH 27.4 26.0 - 34.0 pg   MCHC 31.2 30.0 - 36.0 g/dL   RDW 99.2 42.6 - 83.4 %   Platelets 352 150 - 400  K/uL   nRBC 0.0 0.0 - 0.2 %    Comment: Performed at [facility], [address], [place], [place] [address]  Basic metabolic panel     Status: Abnormal   Collection Time: 04/02/21  3:19 AM  Result Value Ref Range   Sodium 131 (L) 135 - 145 mmol/L   Potassium 4.6 3.5 - 5.1 mmol/L   Chloride 98 98 - 111 mmol/L   CO2 26 22 - 32 mmol/L   Glucose, Bld 140 (H) 70 - 99 mg/dL    Comment: Glucose reference range applies only to samples taken after fasting for at least 8 hours.   BUN 16 8 - 23 mg/dL   Creatinine, Ser 4.03 0.61 - 1.24 mg/dL   Calcium 8.4 (L) 8.9 - 10.3 mg/dL   GFR, Estimated >47 >42 mL/min    Comment: (NOTE) Calculated using the CKD-EPI Creatinine Equation (2021)    Anion gap 7 5 - 15    Comment: Performed at [facility], [address], [place], [place] [address]  C-reactive protein     Status: Abnormal   Collection Time: 04/02/21  3:19 AM  Result Value Ref Range   CRP 8.1 (H) <1.0 mg/dL    Comment: Performed at [facility], [address], [place], [place] [address]  Sedimentation rate     Status: Abnormal   Collection Time: 04/02/21  3:19 AM  Result Value Ref Range   Sed Rate 30 (H) 0 - 16  mm/hr    Comment: Performed at [facility], [address], [place], [place] [address]  CBC     Status: Abnormal   Collection Time: 04/03/21  4:39 AM  Result Value Ref Range   WBC 15.5 (H) 4.0 - 10.5 K/uL   RBC 3.10 (L) 4.22 - 5.81 MIL/uL   Hemoglobin 8.4 (L) 13.0 - 17.0 g/dL   HCT 18.8 (L) 41.6 - 60.6 %   MCV 86.5 80.0 - 100.0 fL   MCH 27.1 26.0 - 34.0 pg   MCHC 31.3 30.0 - 36.0 g/dL   RDW 30.1 60.1 - 09.3 %   Platelets 349 150 - 400 K/uL   nRBC 0.0 0.0 - 0.2 %    Comment: Performed at [facility], [address], [place], [place] [address]  Basic metabolic panel     Status: Abnormal   Collection Time: 04/03/21  4:39 AM  Result Value Ref Range   Sodium 130 (L) 135 - 145 mmol/L   Potassium 3.8 3.5 - 5.1 mmol/L   Chloride 96 (L) 98 - 111 mmol/L   CO2 27 22 - 32 mmol/L   Glucose, Bld 132 (H) 70 - 99 mg/dL    Comment: Glucose reference range applies only to samples taken after fasting for at least 8 hours.   BUN 5 (L) 8 - 23 mg/dL   Creatinine, Ser 3.22 (L) 0.61 - 1.24 mg/dL   Calcium 8.4 (L) 8.9 - 10.3 mg/dL   GFR, Estimated >02 >54 mL/min    Comment: (NOTE) Calculated using the CKD-EPI Creatinine Equation (2021)    Anion gap 7 5 - 15    Comment: Performed at [facility], [address] [place], [place] [address]   No results found.     Medical Problem List and Plan: 1.  Bilateral lower extremity paresthesia secondary to thoracic epidural abscess status post T9-T10 laminectomy decompression followed by reconstruction T6-T10 posterior decompression fusion removal of hardware and  extension of fusion to the thoracic 11 decompression of spinal cord direct T9-T11 laminectomy arthrodesis T6-T11 03/31/2021 as well as Jackson-Pratt drain placed per Dr. Venetia Maxon.  TLSO back brace when out of bed.JP drain removed 10/7.2022  -patient may shower  -ELOS/Goals: 18-21 days 2.  Impaired mobility: continue lovenox. Check vascular study  -antiplatelet therapy: N/A 3.  Postoperative spinal pain: Voltaren 75 mg twice daily, Robaxin and oxycodone as needed. Add back dilaudid 1mg  q6H prn given severity of pain.  4. Mood: Provide emotional support  -antipsychotic agents: N/A 5. Neuropsych: This patient is capable of making decisions on his own behalf. 6. Skin/Wound Care: Routine skin checks 7. Fluids/Electrolytes/Nutrition: Routine in and outs with follow-up chemistries 8.  ID.  Transition from intravenous cefazolin to amoxicillin.  Will discuss duration of antibiotic with infectious disease. 9.  Acute blood loss anemia.  Latest hemoglobin 8.4.  Follow-up CBC 10.  Hyponatremia.  Sodium 131-134.  Follow-up chemistries 11.  Hypertension.  Continue Norvasc 5 mg daily.  Monitor with increased mobility 12.  Irritable bowel syndrome.  Amitiza 24 mcg twice daily 13.  History of gout.  Continue colchicine 14.  OSA.  CPAP 15.  Obesity.  Dietary follow-up  I have personally performed a face to face diagnostic evaluation, including, but not limited to relevant history and physical exam findings, of this patient and developed relevant assessment and plan.  Additionally, I have reviewed and concur with the physician assistant's documentation above.  Angiulli, PA-C

## 2021-04-03 NOTE — Progress Notes (Signed)
Spoke with both RN and patient re: PICC line removal. As per patient, he stated that he is going to CIR and wants the PICC in before discharge. Patient stated that " I don't want to be stuck each time if they lab draws and I have hard veins". Primary RN aware and will notify MD. Will follow up.

## 2021-04-03 NOTE — Evaluation (Signed)
Occupational Therapy Evaluation Patient Details Name: Marco Kennedy MRN: [id number] DOB: 05-31-1951 Today's Date: 04/03/2021   History of Present Illness Pt is a 70 y.o. 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 He is now s/p T9-T10 laminectomy for decompression of thecal sac and debridement of abscess 9/18. Drain placed in left retroperitoneal space on 03/17/19 on bulb suction. Pt underwent T6-T11 PLIF on 03/31/2021. Pt had back sx  01/2020 with Dr. Venetia Maxon; admission 7/16-22 for sepsis and again 7/26 for SOB and fever. PMHx: back sx, arthritis, degenerative lumbar spinal stenosis, THA 2017.   Clinical Impression   Pt seen in conjunction with PT.  He required max A +2 for bed mobility, and stood x 2 with max A +2.  He is significantly limited by pain.   Recommend CIR.       Recommendations for follow up therapy are one component of a multi-disciplinary discharge planning process, led by the attending physician.  Recommendations may be updated based on patient status, additional functional criteria and insurance authorization.   Follow Up Recommendations  CIR    Equipment Recommendations  None recommended by OT    Recommendations for Other Services       Precautions / Restrictions Precautions Precautions: Fall;Back Precaution Booklet Issued: No Precaution Comments: pt able to verbalize 3/3 back precautions, requires cues for implementation during session Required Braces or Orthoses: Spinal Brace Spinal Brace: Thoracolumbosacral orthotic;Applied in sitting position      Mobility Bed Mobility Overal bed mobility: Needs Assistance Bed Mobility: Rolling;Sidelying to Sit;Sit to Sidelying Rolling: Mod assist Sidelying to sit: Max assist;+2 for physical assistance     Sit to sidelying: Max assist;+2 for physical assistance      Transfers Overall transfer level: Needs assistance Equipment used: 2 person hand held  assist (attempted 2 stands with STEDY however unable to extend knees/hips, only clearing buttocks from bed by a few inches) Transfers: Sit to/from Stand Sit to Stand: Max assist;+2 physical assistance;From elevated surface        Lateral/Scoot Transfers: Max assist;+2 physical assistance (sliding laterally toward head of bed) General transfer comment: PT/OT provide bilateral knee buckle and UE support to aide in knee extension, verbal and tactile cues to facilitate hip extension. Pt is able to laterally scoot by pushing with BUEs and with therapist assistance on bed pad    Balance Overall balance assessment: Needs assistance Sitting-balance support: Single extremity supported;Bilateral upper extremity supported;Feet supported Sitting balance-Leahy Scale: Poor Sitting balance - Comments: reliant on UE support and minG Postural control: Posterior lean Standing balance support: Bilateral upper extremity supported Standing balance-Leahy Scale: Zero Standing balance comment: maxA x 2 with knee block, stands for ~15-20 seconds                           ADL either performed or assessed with clinical judgement   ADL Overall ADL's : Needs assistance/impaired                         Toilet Transfer: Maximal assistance;+2 for physical assistance;+2 for safety/equipment;Squat-pivot           Functional mobility during ADLs: Maximal assistance;+2 for physical assistance;+2 for safety/equipment       Vision         Perception     Praxis      Pertinent Vitals/Pain Pain Assessment: Faces Faces Pain Scale: Hurts  whole lot Pain Location: back Pain Descriptors / Indicators: Grimacing Pain Intervention(s): Premedicated before session;Monitored during session;Limited activity within patient's tolerance;Repositioned     Hand Dominance     Extremity/Trunk Assessment Upper Extremity Assessment Upper Extremity Assessment: Generalized weakness   Lower Extremity  Assessment Lower Extremity Assessment: Defer to PT evaluation       Communication     Cognition Arousal/Alertness: Awake/alert Behavior During Therapy: [facility] for tasks assessed/performed;Anxious Overall Cognitive Status: Within Functional Limits for tasks assessed                                     General Comments       Exercises     Shoulder Instructions      Home Living                                          Prior Functioning/Environment                   OT Problem List:        OT Treatment/Interventions:      OT Goals(Current goals can be found in the care plan section) Acute Rehab OT Goals Patient Stated Goal: less pain  OT Frequency: Min 2X/week   Barriers to D/C:            Co-evaluation PT/OT/SLP Co-Evaluation/Treatment: Yes Reason for Co-Treatment: To address functional/ADL transfers;For patient/therapist safety;Complexity of the patient's impairments (multi-system involvement)   OT goals addressed during session: ADL's and self-care;Strengthening/ROM      AM-PAC OT "6 Clicks" Daily Activity     Outcome Measure Help from another person eating meals?: None Help from another person taking care of personal grooming?: A Little Help from another person toileting, which includes using toliet, bedpan, or urinal?: A Lot Help from another person bathing (including washing, rinsing, drying)?: A Lot Help from another person to put on and taking off regular upper body clothing?: A Lot Help from another person to put on and taking off regular lower body clothing?: Total 6 Click Score: 14   End of Session Equipment Utilized During Treatment: Back brace Nurse Communication: Mobility status  Activity Tolerance: Patient limited by pain Patient left: in bed;with call bell/phone within reach;with bed alarm set;with chair alarm set;with family/visitor present  OT Visit Diagnosis: Unsteadiness on feet (R26.81);Other  abnormalities of gait and mobility (R26.89);Muscle weakness (generalized) (M62.81);Repeated falls (R29.6);Pain Pain - part of body:  (back)                Time: 5009-3818 OT Time Calculation (min): 37 min Charges:  OT General Charges $OT Visit: 1 Visit OT Treatments $Neuromuscular Re-education: 8-22 mins  Eber Jones OTR/L Acute Rehabilitation Services Pager [phone] Office [phone]   Jeani Hawking M 04/03/2021, 3:06 PM

## 2021-04-03 NOTE — Progress Notes (Signed)
R SL PICC removed. Site was clean and dry prior to removal. No drainage noted and old dressing was CDI. Patient was informed that he must remain in bed with HOB < 45 degrees for 30 minutes post removal and is to report bleeding or any unusual symptoms to his nurse. Pt was placed in supine position for removal and was instructed to hold his breath. Catheter was intact with blunt end measuring 39cm. Pressures was held for 5 minutes post removal with vasoline gauze and 2X2 gauze then pressure dressing was applied with medipore tape. Son remains at bedside and was instructed of same. Patient's RN notified of OOB time 12:15.

## 2021-04-03 NOTE — Discharge Instructions (Addendum)
Your admitted to the hospital for something called an epidural abscess, which is a collection of bacteria that was located around your spine.  You underwent 2 surgeries by the neurosurgery team.  Per infectious disease team, you will continue your amoxicillin for a total of 4 weeks to make sure the infection has cleared.  Otherwise, you will continue your therapies in the inpatient setting until you are strong enough to leave the hospital.  Follow-up with the Infectious Disease team in 2-4 weeks.  Follow-up with your primary care physician within 1 week of discharge from inpatient rehab.  Also do the same with neurosurgery, please reach out to them to arrange an appointment.

## 2021-04-04 DIAGNOSIS — I10 Essential (primary) hypertension: Secondary | ICD-10-CM

## 2021-04-04 DIAGNOSIS — D62 Acute posthemorrhagic anemia: Secondary | ICD-10-CM

## 2021-04-04 DIAGNOSIS — G8918 Other acute postprocedural pain: Secondary | ICD-10-CM

## 2021-04-04 MED ORDER — HYDROMORPHONE HCL 2 MG PO TABS
1.0000 mg | ORAL_TABLET | ORAL | Status: AC
  Administered 2021-04-04: 1 mg via ORAL

## 2021-04-04 NOTE — Progress Notes (Signed)
PROGRESS NOTE   Subjective/Complaints: Had back pain over night. Denied any other major issues  ROS: Patient denies fever, rash, sore throat, blurred vision, nausea, vomiting, diarrhea, cough, shortness of breath or chest pain, headache, or mood change.    Objective:   No results found. Recent Labs    04/02/21 0319 04/03/21 0439  WBC 9.8 15.5*  HGB 8.4* 8.4*  HCT 26.9* 26.8*  PLT 352 349   Recent Labs    04/02/21 0319 04/03/21 0439  NA 131* 130*  K 4.6 3.8  CL 98 96*  CO2 26 27  GLUCOSE 140* 132*  BUN 16 5*  CREATININE 0.65 0.47*  CALCIUM 8.4* 8.4*    Intake/Output Summary (Last 24 hours) at 04/04/2021 1016 Last data filed at 04/04/2021 0751 Gross per 24 hour  Intake 238 ml  Output 1640 ml  Net -1402 ml        Physical Exam: Vital Signs Blood pressure 129/82, pulse 85, temperature 98 F (36.7 C), temperature source Oral, resp. rate 18, height 5\' 8"  (1.727 m), weight 119.9 kg, SpO2 97 %.  General: Alert and oriented x 3, No apparent distress HEENT: Head is normocephalic, atraumatic, PERRLA, EOMI, sclera anicteric, oral mucosa pink and moist, dentition intact, ext ear canals clear,  Neck: Supple without JVD or lymphadenopathy Heart: Reg rate and rhythm. No murmurs rubs or gallops Chest: CTA bilaterally without wheezes, rales, or rhonchi; no distress Abdomen: Soft, non-tender, non-distended, bowel sounds positive. Extremities: No clubbing, cyanosis, or edema. Pulses are 2+ Psych: Pt's affect is appropriate. Pt is cooperative Skin: surgical site clean, dressed. Neuro:  Alert and oriented x 3. Normal insight and awareness. Intact Memory. Normal language and speech. Cranial nerve exam unremarkable. UE 5/5, LE 3+ prox to 4/5 distally. No focal sensory findings. Movement limited by pain Musculoskeletal: mid-low back pain. No  LE edema    Assessment/Plan: 1. Functional deficits which require 3+ hours per day  of interdisciplinary therapy in a comprehensive inpatient rehab setting. Physiatrist is providing close team supervision and 24 hour management of active medical problems listed below. Physiatrist and rehab team continue to assess barriers to discharge/monitor patient progress toward functional and medical goals  Care Tool:  Bathing              Bathing assist       Upper Body Dressing/Undressing Upper body dressing   What is the patient wearing?: Hospital gown only    Upper body assist Assist Level: Moderate Assistance - Patient 50 - 74%    Lower Body Dressing/Undressing Lower body dressing            Lower body assist       Toileting Toileting    Toileting assist Assist for toileting: 2 Helpers     Transfers Chair/bed transfer  Transfers assist     Chair/bed transfer assist level: 2 Helpers     Locomotion Ambulation   Ambulation assist              Walk 10 feet activity   Assist           Walk 50 feet activity   Assist  Walk 150 feet activity   Assist           Walk 10 feet on uneven surface  activity   Assist           Wheelchair     Assist               Wheelchair 50 feet with 2 turns activity    Assist            Wheelchair 150 feet activity     Assist          Blood pressure 129/82, pulse 85, temperature 98 F (36.7 C), temperature source Oral, resp. rate 18, height 5\' 8"  (1.727 m), weight 119.9 kg, SpO2 97 %.  Medical Problem List and Plan: 1.  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 05/31/2021.  TLSO back brace when out of bed. JP drain  removed 10/7.2022             -patient may shower             -ELOS/Goals: 18-21 days  Patient is beginning CIR therapies today including PT and OT  2.  Impaired mobility: continue lovenox. Check vascular study             -antiplatelet therapy: N/A 3. Postoperative spinal pain: Voltaren 75 mg twice daily, Robaxin and oxycodone as needed. Added back dilaudid 1mg  q6H prn given severity of pain.   -may need to schedule medicine before therapies--will see how things go today 4. Mood: Provide emotional support             -antipsychotic agents: N/A 5. Neuropsych: This patient is capable of making decisions on his own behalf. 6. Skin/Wound Care: Routine skin checks 7. Fluids/Electrolytes/Nutrition: Routine in and outs with follow-up chemistries 8.  ID.  Transitioned from intravenous cefazolin to amoxicillin.  Will discuss duration of antibiotic with infectious disease. 9.  Acute blood loss anemia.  Latest hemoglobin 8.4.  Follow-up CBC 10.  Hyponatremia.  Sodium 131-134.  Follow-up chemistries 11.  Hypertension.  Continue Norvasc 5 mg daily.    -fair control so far 12.  Irritable bowel syndrome.  Amitiza 24 mcg twice daily 13.  History of gout.  Continue colchicine 14.  OSA.  CPAP 15.  Obesity.  Dietary follow-up    LOS: 1 days A FACE TO FACE EVALUATION WAS PERFORMED  02-12-1978 04/04/2021, 10:16 AM

## 2021-04-04 NOTE — Evaluation (Signed)
Occupational Therapy Assessment and Plan  Patient Details  Name: Marco Kennedy MRN: [id number] Date of Birth: 07/13/1950  OT Diagnosis: abnormal posture, muscle weakness (generalized), and pain in thoracic spine Rehab Potential: Rehab Potential (ACUTE ONLY): Fair ELOS: 3 weeks   Today's Date: 04/04/2021 OT Individual Time: 0730-0900 OT Individual Time Calculation (min): 90 min     Hospital Problem: Principal Problem:   Epidural abscess   Past Medical History:  Past Medical History:  Diagnosis Date   Arthritis    arthritis-bilateral knees, left Hip.   Degenerative lumbar spinal stenosis    EKG abnormality    06-08-16- being evaluated by cardiologist  in [place]   Idiopathic scoliosis of lumbar region    Joint stiffness    Knee pain    Leg swelling    Low back pain    Achy, shooting pain.  Hurts to Walk or stand up straight.   Lumbar radiculopathy    Scoliosis concern    Sleep apnea    Bipap use nightly 25/19 -3l/m oxygen   Spondylolisthesis of lumbar region    Transfusion history    [place] ,[place] after bleeding post colon polyp removal and colonscopy procedure   Past Surgical History:  Past Surgical History:  Procedure Laterality Date   ABDOMINAL EXPOSURE N/A 04/18/2020   Procedure: ABDOMINAL EXPOSURE;  Surgeon: Clark, Christopher J, MD;  Location: [facility];  Service: Vascular;  Laterality: N/A;   ANTERIOR LAT LUMBAR FUSION Left 04/18/2020   Procedure: ANTERIOR LATERAL LUMBAR FUSION LUMBAR TWO-THREE, LUMBAR THREE-FOUR;  Surgeon: Stern, Joseph, MD;  Location: [facility] OR;  Service: Neurosurgery;  Laterality: Left;   ANTERIOR LUMBAR FUSION N/A 04/18/2020   Procedure: Lumbar Four-Five Lumbar Five- Sacral One Anterior lumbar Interbody Fusion;  Surgeon: Stern, Joseph, MD;  Location: [facility];  Service: Neurosurgery;  Laterality: N/A;   APPLICATION OF INTRAOPERATIVE CT SCAN N/A 04/21/2020   Procedure: APPLICATION OF INTRAOPERATIVE CT SCAN;  Surgeon: Stern, Joseph, MD;  Location: [facility]  OR;  Service: Neurosurgery;  Laterality: N/A;   BACK SURGERY     lumbar disc surgery   COLONOSCOPY W/ POLYPECTOMY     HERNIA REPAIR Left    left inguinal hernia repair   IR RADIOLOGIST EVAL & MGMT  02/24/2021   KNEE ARTHROSCOPY Right 2003    -scope for meniscus   LUMBAR WOUND DEBRIDEMENT N/A 01/13/2021   Procedure: THORACOLUMBAR WOUND DEBRIDEMENT;  Surgeon: Stern, Joseph, MD;  Location: [facility];  Service: Neurosurgery;  Laterality: N/A;   LUMBAR WOUND DEBRIDEMENT N/A 03/14/2021   Procedure: WOUND EXPLORATION AND DEBRIDEMENT OF ABSCESS;  Surgeon: Nundkumar, Neelesh, MD;  Location: [facility];  Service: Neurosurgery;  Laterality: N/A;   POSTERIOR LUMBAR FUSION 4 LEVEL N/A 04/21/2020   Procedure: Thoracic ten to pelvis pedicle screw fixation with osteotomies;  Surgeon: Stern, Joseph, MD;  Location: [facility];  Service: Neurosurgery;  Laterality: N/A;   POSTERIOR LUMBAR FUSION 4 LEVEL N/A 03/31/2021   Procedure: Thoracic six to Thoracic nine  Posterior decompression/Fusion with removal hardware thoracic ten;  Surgeon: Stern, Joseph, MD;  Location: [facility];  Service: Neurosurgery;  Laterality: N/A;   THORACIC LAMINECTOMY FOR EPIDURAL ABSCESS N/A 03/14/2021   Procedure: THORACIC LAMINECTOMY, THORACIC NINE-THORACIC TEN;  Surgeon: Nundkumar, Neelesh, MD;  Location: [facility];  Service: Neurosurgery;  Laterality: N/A;   TOTAL HIP ARTHROPLASTY Left 06/15/2016   Procedure: LEFT TOTAL HIP ARTHROPLASTY ANTERIOR APPROACH;  Surgeon: Matthew Olin, MD;  Location: [facility];  Service: Orthopedics;  Laterality: Left;    Assessment &   Plan Clinical Impression: Marco Kennedy is a 70-year-old right-handed male with history of obesity, OSA maintained on CPAP and recent admission 01/10/2021 - 01/16/2021 for sepsis secondary to moderate-sized abscess near the pelvic hardware.  Neurosurgery interventional radiology and infectious disease were involved in his care at that point underwent wound debridement with neurosurgery on 7/19 with E.  coli from wound cultures.  He was given cefazolin 1 with plan of 6 weeks of treatment.  He was discharged to home noted bilateral rib pain needing to sleep in a lift chair.  Home health nurse 7/25 to assess the PICC line recommended follow-up PCP they decided to try telehealth but by that time patient was noted to have some coughing and nonspecific chest discomfort.  He developed low-grade fevers and was admitted 01/20/2021.  He did undergo bilateral pigtail catheters placed in pleural cavities.  Neurosurgery follow-up in regards to osteomyelitis no current plan for repeat surgical intervention at that time he was maintained on cefazolin and most recent blood cultures negative.  He was admitted to inpatient rehab services 02/03/2021 - 02/16/2021 for vertebral osteomyelitis.  He was discharged home ambulating 100 feet contact-guard assist.  Presented 03/14/2021 for worsening discitis as well as enlarging epidural abscess T9-T10 resulting in spinal stenosis.  Patient underwent emergent T9-T10 laminectomy for decompression of thecal sac 03/14/2021 per Dr. Nundkumar.  Hospital course followed by neurosurgery Dr. Stern patient returned to the OR for reconstruction and correction of thoracic kyphosis and decompression of thoracic spinal cord 03/31/2021 undergoing T6-T9 posterior decompression and extension of fusion to thoracic 11, decompression of spinal cord dura T9-T11 laminectomy, posterior lateral arthrodesis T6-T11 levels as well as placement of Jackson-Pratt drain.  TLSO back brace when out of bed only applied in sitting position.  Infectious disease consulted he has been transitioned from intravenous cefazolin to p.o. amoxicillin.  Maintained on subcutaneous Lovenox for DVT prophylaxis.  Therapy evaluations completed due to patient decreased functional mobility was admitted for a comprehensive rehab program. He complains of severe pain currently while working with therapy. Patient transferred to CIR on 04/03/2021 .     Patient currently requires max with basic self-care skills secondary to muscle weakness and muscle paralysis and impaired timing and sequencing, unbalanced muscle activation, and decreased coordination.  Prior to hospitalization, patient could complete ADLs with Mod I to Min A.  Patient will benefit from skilled intervention to decrease level of assist with basic self-care skills, increase independence with basic self-care skills, and increase level of independence with iADL prior to discharge home with family.  Anticipate patient will require minimal physical assistance and follow up home health.  OT - End of Session Endurance Deficit: Yes Endurance Deficit Description: fatigues quickly with functional activity   OT Evaluation Precautions/Restrictions  Precautions Precautions: Fall;Back Precaution Comments: reviewed back precautions Required Braces or Orthoses: Spinal Brace Spinal Brace: Thoracolumbosacral orthotic;Applied in sitting position Spinal Brace Comments: TLSO when OOB only Restrictions Weight Bearing Restrictions: No General PT Missed Treatment Reason: Pain Vital Signs   Pain Pain Assessment Pain Scale: 0-10 Pain Score: 7  Faces Pain Scale: Hurts little more Pain Type: Surgical pain Pain Location: Back Pain Orientation: Mid;Lower Patients Stated Pain Goal: 1 Pain Intervention(s): Medication (See eMAR) Home Living/Prior Functioning Home Living Family/patient expects to be discharged to:: Private residence Living Arrangements: Spouse/significant other Available Help at Discharge: Available PRN/intermittently (wife works during the day but can work from home if needed; nephew can assist) Type of Home: House Home Access: Stairs to enter Entrance Stairs-Number   of Steps: approximately 14 total (6-7 steps followed by a walkway then 6-7 additional steps). Reports ability to enter home at [place] into kitchen/dining area with 1 STE (5 in) but would have to drive "around  back". Somewhat uneve surface with grass but has gotten into home this way with wc before. Entrance Stairs-Rails: Right, Can reach both Home Layout: One level, Laundry or work area in basement, Other (Comment) Alternate Level Stairs-Number of Steps: flight Alternate Level Stairs-Rails: Right Bathroom Shower/Tub: Tub/shower unit Bathroom Toilet: Standard (Toilet riser on top of standard height toilet) Bathroom Accessibility: No (Not accessible by wc; RW can fit in sideways) Additional Comments[place] ready until 06/2021.  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 Perception  Perception: Within Functional Limits Praxis Praxis: Intact Cognition Overall Cognitive Status: Within Functional Limits for tasks assessed Arousal/Alertness: Awake/alert Orientation Level: Person;Place;Situation Person: Oriented Place: Oriented Situation: Oriented Year: 2022 Month: October Day of Week: Correct Memory: Appears intact Immediate Memory Recall: Sock;Blue;Bed Memory Recall Sock: With Cue Memory Recall Blue:  Without Cue Memory Recall Bed: Without Cue Attention: Focused;Sustained Focused Attention: Appears intact Sustained Attention: Appears intact Awareness: Appears intact Problem Solving: Appears intact Safety/Judgment: Appears intact Sensation Sensation Light Touch: Impaired Detail Light Touch Impaired Details: Impaired RLE;Impaired LLE (impaired distally > proximally) Proprioception: Impaired Detail Proprioception Impaired Details: Impaired RLE;Impaired LLE Coordination Gross Motor Movements are Fluid and Coordinated: No Fine Motor Movements are Fluid and Coordinated: Yes Coordination and Movement Description: impaired 2/2 pain and LE weakness Heel Shin Test: impaired BLE Motor  Motor Motor: Paraplegia;Abnormal tone;Abnormal postural alignment and control Motor - Skilled Clinical Observations: impaired 2/2 paind and surgery  Trunk/Postural Assessment  Cervical Assessment Cervical Assessment: Within Functional Limits Thoracic Assessment Thoracic Assessment: Exceptions to WFL (rounded shoulders) Lumbar Assessment Lumbar Assessment: Exceptions to WFL (posterior pelvic tilt) Postural Control Postural Control: Deficits on evaluation Trunk Control: impaired, mod to max A unsupported sitting balance  Balance Balance Balance Assessed: Yes Static Sitting Balance Static Sitting - Balance Support: No upper extremity supported;Feet supported Static Sitting - Level of Assistance: 3: Mod assist;2: Max assist Dynamic Sitting Balance Dynamic Sitting - Balance Support: No upper extremity supported;Feet supported;During functional activity Dynamic Sitting - Level of Assistance: 1: +2 Total assist Extremity/Trunk Assessment RUE Assessment RUE Assessment: Within Functional Limits General Strength Comments: grossly 4/5, WFL for ADL tasks LUE Assessment LUE Assessment: Within Functional Limits General Strength Comments: grossly 4/5, WFL for ADL tasks  Care Tool Care Tool Self Care Eating    Eating Assist Level: Set up assist    Oral Care    Oral Care Assist Level: Set up assist    Bathing   Body parts bathed by patient: Right arm;Left arm;Chest;Abdomen;Front perineal area;Face Body parts bathed by helper: Buttocks;Right upper leg;Left upper   leg;Right lower leg;Left lower leg   Assist Level: Maximal Assistance - Patient 24 - 49%    Upper Body Dressing(including orthotics)   What is the patient wearing?: Pull over shirt   Assist Level: Maximal Assistance - Patient 25 - 49%    Lower Body Dressing (excluding footwear)   What is the patient wearing?: Pants;Incontinence brief Assist for lower body dressing: Total Assistance - Patient < 25%    Putting on/Taking off footwear   What is the patient wearing?: Socks;Shoes Assist for footwear: Dependent - Patient 0%       Care Tool Toileting Toileting activity   Assist for toileting: Set up assist Assistive Device Comment: able to manage urinal with set-up assist; reports need for suppository for BM   Care Tool Bed Mobility Roll left and right activity   Roll left and right assist level: Moderate Assistance - Patient 50 - 74% Roll left and right assistive device comment: bed rail  Sit to lying activity   Sit to lying assist level: Maximal Assistance - Patient 25 - 49%    Lying to sitting on side of bed activity   Lying to sitting on side of bed assist level: the ability to move from lying on the back to sitting on the side of the bed with no back support.: Maximal Assistance - Patient 25 - 49%     Care Tool Transfers Sit to stand transfer Sit to stand activity did not occur: Safety/medical concerns      Chair/bed transfer   Chair/bed transfer assist level: 2 Helpers (squat-pivot to drop-arm recliner)     Toilet transfer Toilet transfer activity did not occur: Safety/medical concerns       Care Tool Cognition  Expression of Ideas and Wants Expression of Ideas and Wants: 4. Without difficulty (complex and  basic) - expresses complex messages without difficulty and with speech that is clear and easy to understand  Understanding Verbal and Non-Verbal Content Understanding Verbal and Non-Verbal Content: 4. Understands (complex and basic) - clear comprehension without cues or repetitions   Memory/Recall Ability Memory/Recall Ability : Current season;Location of own room;Staff names and faces;That he or she is in a hospital/hospital unit   Refer to Care Plan for Long Term Goals  SHORT TERM GOAL WEEK 1 OT Short Term Goal 1 (Week 1): Patient will don UB clothing seated EOB with Min A. OT Short Term Goal 2 (Week 1): Patient will complete squat-pivot transfers with Mod A +2. OT Short Term Goal 3 (Week 1): Patient will don LB clothing at bed level with Mod A of 1 helper and AD PRN.  Recommendations for other services: Other: TBD    Skilled Therapeutic Intervention Patient met lying supine in bed in agreement with OT treatment session. 3/10 pain reported at rest and 9/10 with activity in low back (on right). Pain meds administered prior to and at start of OT evaluation but patient continues to be greatly limited by pain throughout session limiting participation with ADLs and functional transfers. OT discussed role, expectations of CIR, ELOS and goals. Patient expressed verbal understanding. UB/LB bathing completed at bed level with Max A. Total A for LB dressing in supine and Max A for UB dressing seated EOB including donning of TLSO. Patient bracing BUE on bed surface requiring cues for pursed lip breathing. Eventually able to tolerate sitting EOB without BUE supported on bed surface in prep for grooming with set-up assist. Max A +2 for squat-pivot to drop-arm recliner on R. Session concluded   with patient seated in recliner with call bell within reach, belt alarm activated and all needs met.   ADL ADL Eating: Set up Grooming: Setup Where Assessed-Grooming: Edge of bed (sitting EOB with Min to Mod A to  maintain static sitting balance) Upper Body Bathing: Moderate assistance Where Assessed-Upper Body Bathing: Bed level Lower Body Bathing: Dependent Where Assessed-Lower Body Bathing: Bed level Upper Body Dressing: Maximal assistance Where Assessed-Upper Body Dressing: Edge of bed Lower Body Dressing: Dependent Where Assessed-Lower Body Dressing: Bed level Toileting: Setup Where Assessed-Toileting: Other (Comment) (Urinal) Toilet Transfer Method: Unable to assess Tub/Shower Transfer: Unable to assess Mobility  Bed Mobility Bed Mobility: Rolling Right;Rolling Left;Supine to Sit;Sit to Supine Rolling Right: Moderate Assistance - Patient 50-74% Rolling Left: Moderate Assistance - Patient 50-74% Right Sidelying to Sit: Maximal Assistance - Patient 25-49% Supine to Sit: Maximal Assistance - Patient - Patient 25-49%   Discharge Criteria: Patient will be discharged from OT if patient refuses treatment 3 consecutive times without medical reason, if treatment goals not met, if there is a change in medical status, if patient makes no progress towards goals or if patient is discharged from hospital.  The above assessment, treatment plan, treatment alternatives and goals were discussed and mutually agreed upon: by patient  Marco Kennedy 04/04/2021, 12:38 PM

## 2021-04-04 NOTE — Plan of Care (Signed)
  Problem: RH Bed Mobility Goal: LTG Patient will perform bed mobility with assist (PT) Description: LTG: Patient will perform bed mobility with assistance, with/without cues (PT). Flowsheets (Taken 04/04/2021 1246) LTG: Pt will perform bed mobility with assistance level of: Minimal Assistance - Patient > 75%   Problem: RH Bed to Chair Transfers Goal: LTG Patient will perform bed/chair transfers w/assist (PT) Description: LTG: Patient will perform bed to chair transfers with assistance (PT). Flowsheets (Taken 04/04/2021 1246) LTG: Pt will perform Bed to Chair Transfers with assistance level: Minimal Assistance - Patient > 75%   Problem: RH Car Transfers Goal: LTG Patient will perform car transfers with assist (PT) Description: LTG: Patient will perform car transfers with assistance (PT). Flowsheets (Taken 04/04/2021 1246) LTG: Pt will perform car transfers with assist:: Minimal Assistance - Patient > 75%   Problem: RH Wheelchair Mobility Goal: LTG Patient will propel w/c in controlled environment (PT) Description: LTG: Patient will propel wheelchair in controlled environment, # of feet with assist (PT) Flowsheets (Taken 04/04/2021 1246) LTG: Pt will propel w/c in controlled environ  assist needed:: Independent with assistive device LTG: Propel w/c distance in controlled environment: 150 ft Goal: LTG Patient will propel w/c in home environment (PT) Description: LTG: Patient will propel wheelchair in home environment, # of feet with assistance (PT). Flowsheets (Taken 04/04/2021 1246) LTG: Pt will propel w/c in home environ  assist needed:: Independent with assistive device LTG: Propel w/c distance in home environment: 75 ft

## 2021-04-04 NOTE — Progress Notes (Signed)
Pt requesting medication for breakthrough pain until time for PRN dose. MD made aware and order was given.

## 2021-04-04 NOTE — Progress Notes (Signed)
RT note. Patient refuse CPAP for the night.

## 2021-04-04 NOTE — Evaluation (Signed)
Physical Therapy Assessment and Plan  Patient Details  Name: Marco Kennedy MRN: [id number] Date of Birth: 1951/04/20  PT Diagnosis: Abnormal posture, Difficulty walking, Muscle spasms, Muscle weakness, Paraplegia, and Pain in mid-low back Rehab Potential: Fair ELOS: 21-24 days   Today's Date: 04/04/2021 PT Individual Time: 1000-1050 PT Individual Time Calculation (min): 50 min   PT Missed Time: 10 min Missed Time Reason: pain  Hospital Problem: Principal Problem:   Epidural abscess   Past Medical History:  Past Medical History:  Diagnosis Date   Arthritis    arthritis-bilateral knees, left Hip.   Degenerative lumbar spinal stenosis    EKG abnormality    06-08-16- being evaluated by cardiologist  in [place]   Idiopathic scoliosis of lumbar region    Joint stiffness    Knee pain    Leg swelling    Low back pain    Achy, shooting pain.  Hurts to Walk or stand up straight.   Lumbar radiculopathy    Scoliosis concern    Sleep apnea    Bipap use nightly 25/19 -3l/m oxygen   Spondylolisthesis of lumbar region    Transfusion history    [place] ,[place] after bleeding post colon polyp removal and colonscopy procedure   Past Surgical History:  Past Surgical History:  Procedure Laterality Date   ABDOMINAL EXPOSURE N/A 04/18/2020   Procedure: ABDOMINAL EXPOSURE;  Surgeon: Marty Heck, MD;  Location: [place];  Service: Vascular;  Laterality: N/A;   ANTERIOR LAT LUMBAR FUSION Left 04/18/2020   Procedure: ANTERIOR LATERAL LUMBAR FUSION LUMBAR TWO-THREE, LUMBAR THREE-FOUR;  Surgeon: Erline Levine, MD;  Location: ;  Service: Neurosurgery;  Laterality: Left;   ANTERIOR LUMBAR FUSION N/A 04/18/2020   Procedure: Lumbar Four-Five Lumbar Five- Sacral One Anterior lumbar Interbody Fusion;  Surgeon: Erline Levine, MD;  Location: [place];  Service: Neurosurgery;  Laterality: N/A;   APPLICATION OF INTRAOPERATIVE CT SCAN N/A 04/21/2020   Procedure: APPLICATION OF INTRAOPERATIVE CT  SCAN;  Surgeon: Erline Levine, MD;  Location: [place];  Service: Neurosurgery;  Laterality: N/A;   BACK SURGERY     lumbar disc surgery   COLONOSCOPY W/ POLYPECTOMY     HERNIA REPAIR Left    left inguinal hernia repair   IR RADIOLOGIST EVAL & MGMT  02/24/2021   KNEE ARTHROSCOPY Right 2003    -scope for meniscus   LUMBAR WOUND DEBRIDEMENT N/A 01/13/2021   Procedure: THORACOLUMBAR WOUND DEBRIDEMENT;  Surgeon: Erline Levine, MD;  Location: [place];  Service: Neurosurgery;  Laterality: N/A;   LUMBAR WOUND DEBRIDEMENT N/A 03/14/2021   Procedure: WOUND EXPLORATION AND DEBRIDEMENT OF ABSCESS;  Surgeon: Consuella Lose, MD;  Location: [place];  Service: Neurosurgery;  Laterality: N/A;   POSTERIOR LUMBAR FUSION 4 LEVEL N/A 04/21/2020   Procedure: Thoracic ten to pelvis pedicle screw fixation with osteotomies;  Surgeon: Erline Levine, MD;  Location: [place];  Service: Neurosurgery;  Laterality: N/A;   POSTERIOR LUMBAR FUSION 4 LEVEL N/A 03/31/2021   Procedure: Thoracic six to Thoracic nine  Posterior decompression/Fusion with removal hardware thoracic ten;  Surgeon: Erline Levine, MD;  Location: [place];  Service: Neurosurgery;  Laterality: N/A;   THORACIC LAMINECTOMY FOR EPIDURAL ABSCESS N/A 03/14/2021   Procedure: THORACIC LAMINECTOMY, THORACIC NINE-THORACIC TEN;  Surgeon: Consuella Lose, MD;  Location: [place];  Service: Neurosurgery;  Laterality: N/A;   TOTAL HIP ARTHROPLASTY Left 06/15/2016   Procedure: LEFT TOTAL HIP ARTHROPLASTY ANTERIOR APPROACH;  Surgeon: Paralee Cancel, MD;  Location: [facility];  Service: Orthopedics;  Laterality: Left;    Assessment & Plan Clinical Impression:  Marco Kennedy is a 70 year old right-handed male with history of obesity, OSA maintained on CPAP and recent admission 01/10/2021 - 01/16/2021 for sepsis secondary to moderate-sized abscess near the pelvic hardware.  Neurosurgery interventional radiology and infectious disease were involved in his care at that point underwent  wound debridement with neurosurgery on 7/19 with E. coli from wound cultures.  He was given cefazolin 1 with plan of 6 weeks of treatment.  He was discharged to home noted bilateral rib pain needing to sleep in a lift chair.  Home health nurse 7/25 to assess the PICC line recommended follow-up PCP they decided to try telehealth but by that time patient was noted to have some coughing and nonspecific chest discomfort.  He developed low-grade fevers and was admitted 01/20/2021.  He did undergo bilateral pigtail catheters placed in pleural cavities.  Neurosurgery follow-up in regards to osteomyelitis no current plan for repeat surgical intervention at that time he was maintained on cefazolin and most recent blood cultures negative.  He was admitted to inpatient rehab services 02/03/2021 - 02/16/2021 for vertebral osteomyelitis.  He was discharged home ambulating 100 feet contact-guard assist.  Presented 03/14/2021 for worsening discitis as well as enlarging epidural abscess T9-T10 resulting in spinal stenosis.  Patient underwent emergent T9-T10 laminectomy for decompression of thecal sac 03/14/2021 per Dr. Kathyrn Sheriff.  Hospital course followed by neurosurgery Dr. Vertell Limber patient returned to the OR for reconstruction and correction of thoracic kyphosis and decompression of thoracic spinal cord 03/31/2021 undergoing T6-T9 posterior decompression and extension of fusion to thoracic 11, decompression of spinal cord dura T9-T11 laminectomy, posterior lateral arthrodesis T6-T11 levels as well as placement of Jackson-Pratt drain.  TLSO back brace when out of bed only applied in sitting position.  Infectious disease consulted he has been transitioned from intravenous cefazolin to p.o. amoxicillin.  Maintained on subcutaneous Lovenox for DVT prophylaxis.  Therapy evaluations completed due to patient decreased functional mobility was admitted for a comprehensive rehab program. He complains of severe pain currently while working with  therapy. Patient transferred to CIR on 04/03/2021 .   Patient currently requires total with mobility secondary to muscle weakness, decreased cardiorespiratoy endurance, abnormal tone and unbalanced muscle activation, and decreased sitting balance, decreased postural control, and decreased balance strategies.  Prior to hospitalization, patient was supervision with mobility and lived with Spouse in a House home.  Home access is approximately 14 total (6-7 steps followed by a walkway then 6-7 additional steps). Reports ability to enter home at back into kitchen/dining area with 1 STE (5 in) but would have to drive "around back". Somewhat uneven surface with grass but has gotten into home this way with wc before.Stairs to enter.  Patient will benefit from skilled PT intervention to maximize safe functional mobility, minimize fall risk, and decrease caregiver burden for planned discharge home with 24 hour assist.  Anticipate patient will benefit from follow up [facility] at discharge.  PT - End of Session Activity Tolerance: Tolerates 30+ min activity with multiple rests Endurance Deficit: Yes Endurance Deficit Description: fatigues quickly with functional activity PT Assessment Rehab Potential (ACUTE/IP ONLY): Fair PT Barriers to Discharge: [place] home environment;Decreased caregiver support;Home environment access/layout;Other (comments) (pain) PT Patient demonstrates impairments in the following area(s): Balance;Motor;Endurance;Pain;Safety;Sensory PT Transfers Functional Problem(s): Bed Mobility;Bed to Chair;Car;Furniture;Floor PT Locomotion Functional Problem(s): Ambulation;Wheelchair Mobility;Stairs PT Plan PT Intensity: Minimum of 1-2 x/day ,45 to 90 minutes PT Frequency: 5 out of 7 days PT Duration  Estimated Length of Stay: 21-24 days PT Treatment/Interventions: Ambulation/gait training;Balance/vestibular training;Community reintegration;Discharge planning;Disease  management/prevention;DME/adaptive equipment instruction;Functional electrical stimulation;Functional mobility training;Neuromuscular re-education;Pain management;Psychosocial support;Patient/family education;Splinting/orthotics;Stair training;Therapeutic Activities;Therapeutic Exercise;UE/LE Strength taining/ROM;UE/LE Coordination activities;Wheelchair propulsion/positioning PT Transfers Anticipated Outcome(s): min A PT Locomotion Anticipated Outcome(s): min A at w/c level PT Recommendation Recommendations for Other Services: Neuropsych consult;Therapeutic Recreation consult Therapeutic Recreation Interventions: Stress management Follow Up Recommendations: Home health PT Patient destination: Home Equipment Recommended: None recommended by PT Equipment Details: pt already owns w/c, RW, rollator   PT Evaluation Precautions/Restrictions Precautions Precautions: Fall;Back Precaution Comments: reviewed back precautions Required Braces or Orthoses: Spinal Brace Spinal Brace: Thoracolumbosacral orthotic;Applied in sitting position Spinal Brace Comments: TLSO when OOB only Restrictions Weight Bearing Restrictions: No General PT Amount of Missed Time (min): 10 Minutes PT Missed Treatment Reason: Pain Pain Interference Pain Interference Pain Effect on Sleep: 2. Occasionally Pain Interference with Therapy Activities: 4. Almost constantly Pain Interference with Day-to-Day Activities: 4. Almost constantly Home Living/Prior Functioning Home Living Available Help at Discharge: Available PRN/intermittently (wife works during the day but can work from home if needed; nephew can assist) Type of Home: House Home Access: Stairs to enter [employer] of Steps: approximately 14 total (6-7 steps followed by a walkway then 6-7 additional steps). Reports ability to enter home at [place]acl into kitchen/dining area with 1 STE (5 in) but would have to drive "around back". Somewhat uneve surface with  grass but has gotten into home this way with wc before. Entrance Stairs-Rails: Right;Can reach both Home Layout: One level;Laundry or work area in basement;Other (Comment) Alternate Level Stairs-Number of Steps: flight Alternate Level Stairs-Rails: Right Additional Comments[place] ready until 06/2021.  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 Perception Perception: Within Functional Limits Praxis Praxis: Intact  Cognition Overall Cognitive Status: Within Functional Limits for tasks assessed Arousal/Alertness: Awake/alert Orientation Level: Oriented X4 Year: 2022 Attention: Focused;Sustained Focused Attention: Appears intact Sustained Attention: Appears intact Memory: Appears intact Awareness: Appears intact Problem Solving: Appears intact Safety/Judgment: Appears intact Sensation Sensation Light Touch: Impaired Detail Light Touch Impaired Details: Impaired RLE;Impaired LLE (impaired distally > proximally) Proprioception: Impaired Detail Proprioception Impaired Details: Impaired RLE;Impaired LLE Coordination Gross Motor Movements are Fluid and Coordinated: No Fine Motor Movements are Fluid and Coordinated: Yes Coordination and Movement Description: impaired 2/2 pain and LE weakness Heel Shin Test: impaired BLE Motor  Motor Motor: Paraplegia;Abnormal tone;Abnormal postural alignment and  control Motor - Skilled Clinical Observations: impaired 2/2 pain and surgery  Trunk/Postural Assessment  Cervical Assessment Cervical Assessment: Within Functional Limits Thoracic Assessment Thoracic Assessment: Exceptions to [facility] (rounded shoulders) Lumbar Assessment Lumbar Assessment: Exceptions to [facility] (posterior pelvic tilt) Postural Control Postural Control: Deficits on evaluation Trunk Control: impaired, mod to max A unsupported sitting balance  Balance Balance Balance Assessed: Yes Static Sitting Balance Static Sitting - Balance Support: No upper extremity supported;Feet supported Static Sitting - Level of Assistance: 3: Mod assist;2: Max assist Dynamic Sitting Balance Dynamic Sitting - Balance Support: No upper extremity supported;Feet supported;During functional activity Dynamic Sitting - Level of Assistance:  1: +2 Total assist Extremity Assessment   RLE Assessment RLE Assessment: Exceptions to [facility] General Strength Comments: impaired, see below RLE Strength Right Hip Flexion: 3-/5 Right Knee Flexion: 4/5 Right Knee Extension: 4/5 Right Ankle Dorsiflexion: 2-/5 LLE Assessment LLE Assessment: Exceptions to [facility] General Strength Comments: impaired, see below LLE Strength Left Hip Flexion: 2-/5 Left Knee Flexion: 4/5 Left Knee Extension: 4/5 Left Ankle Dorsiflexion: 2-/5  Care Tool Care Tool Bed Mobility Roll left and right activity   Roll left and right assist level: Moderate Assistance - Patient 50 - 74%    Sit to lying activity   Sit to lying assist level: Maximal Assistance - Patient 25 - 49%    Lying to sitting on side of bed activity   Lying to sitting on side of bed assist level: the ability to move from lying on the back to sitting on the side of the bed with no back support.: Maximal Assistance - Patient 25 - 49%     Care Tool Transfers Sit to stand transfer Sit to stand activity did not occur: Safety/medical concerns      Chair/bed transfer    Chair/bed transfer assist level: 2 Public relations account executive transfer activity did not occur: Safety/medical concerns        Care Tool Locomotion Ambulation Ambulation activity did not occur: Safety/medical concerns        Walk 10 feet activity Walk 10 feet activity did not occur: Safety/medical concerns       Walk 50 feet with 2 turns activity Walk 50 feet with 2 turns activity did not occur: Safety/medical concerns      Walk 150 feet activity Walk 150 feet activity did not occur: Safety/medical concerns      Walk 10 feet on uneven surfaces activity Walk 10 feet on uneven surfaces activity did not occur: Safety/medical concerns      Stairs Stair activity did not occur: Safety/medical concerns        Walk up/down 1 step activity Walk up/down 1 step or curb (drop down) activity did not occur: Safety/medical concerns     Walk up/down 4 steps activity did not occuR: Safety/medical concerns  Walk up/down 4 steps activity      Walk up/down 12 steps activity Walk up/down 12 steps activity did not occur: Safety/medical concerns      Pick up small objects from floor Pick up small object from the floor (from standing position) activity did not occur: Safety/medical concerns      Wheelchair Is the patient using a wheelchair?: Yes Type of Wheelchair: Manual   Wheelchair assist level: Dependent - Patient 0%    Wheel 50 feet with 2 turns activity   Assist Level: Dependent - Patient 0%  Wheel 150 feet activity   Assist Level: Dependent - Patient 0%    Refer to Care Plan for Long Term Goals  SHORT TERM GOAL WEEK 1 PT Short Term Goal 1 (Week 1): Pt will tolerate sitting upright x 5 min with no more than 5/10 pain PT Short Term Goal 2 (Week 1): Pt will initiate w/c mobility PT Short Term Goal 3 (Week 1): Pt will perform bed mobility with mod A consistently adhering to back precautions  Recommendations for other services: Neuropsych and  Therapeutic Recreation  Stress management  Skilled Therapeutic Intervention Evaluation completed (see details above and below) with education on PT POC and goals and individual treatment initiated  with focus on setting pt up with appropriate equipment to be utilized during rehab stay and attempted assessment of functional transfers. Pt received seated in recliner in room, agreeable to PT evaluation. Pt reports 5/10 pain at rest in his mid-lower back to the L of the spine. Pt premedicated prior to start of therapy session as well as received muscle relaxers. Pt agreeable to attempt standing to stedy, pt with increase in pain intensity and severity to 9/10 in same region with anterior lean of trunk to sit upright. Pt requires +2 to sit forwards from back of recliner. Even with +2 total A pt unable to fully stand to stedy due to LE and core musculature weakness as well as pain. Deferred use of stedy this session. Demonstrated how to perform slide board transfer, pt agreeable to attempt. Pt again requires +2 for anterior lean and unable to tolerate sitting upright for more than 15-20 sec due to pain. Pt requesting to remain seated in recliner at end of session rather than transfer back to bed. Slide board transfer deferred at this time due to pain and pt wanting to stay in recliner chair. Seated BLE strengthening therex: marches, LAQ x 10 reps each. Pt left seated in recliner in room with needs in reach, chair alarm in place. Pt missed 10 min of scheduled therapy session due to ongoing severe back pain limiting ability to functionally participate in therapy session.  Mobility Bed Mobility Bed Mobility: Rolling Right;Rolling Left;Supine to Sit;Sit to Supine Rolling Right: Moderate Assistance - Patient 50-74% Rolling Left: Moderate Assistance - Patient 50-74% Right Sidelying to Sit: Maximal Assistance - Patient 25-49% Supine to Sit: Maximal Assistance - Patient - Patient 25-49% Transfers Transfers: Radiation protection practitioner Transfers: 2 Geologist, engineering / Additional Locomotion Stairs: No Wheelchair Mobility Wheelchair Mobility: No   Discharge Criteria: Patient will be discharged from PT if patient refuses treatment 3 consecutive times without medical reason, if treatment goals not met, if there is a change in medical status, if patient makes no progress towards goals or if patient is discharged from hospital.  The above assessment, treatment plan, treatment alternatives and goals were discussed and mutually agreed upon: by patient   Excell Seltzer, PT, DPT, CSRS 04/04/2021, 12:33 PM

## 2021-04-04 NOTE — Discharge Instructions (Addendum)
Inpatient Rehab Discharge Instructions  YAIDEN YANG Discharge date and time: No discharge date for patient encounter.   Activities/Precautions/ Functional Status: Activity: Back brace when out of bed Diet: Regular Wound Care: Routine skin checks Functional status:  ___ No restrictions     ___ Walk up steps independently ___ 24/7 supervision/assistance   ___ Walk up steps with assistance ___ Intermittent supervision/assistance  ___ Bathe/dress independently ___ Walk with walker     _x__ Bathe/dress with assistance ___ Walk Independently    ___ Shower independently ___ Walk with assistance    ___ Shower with assistance ___ No alcohol     ___ Return to work/school ________  Special Instructions: Follow-up with infectious disease in regards to duration plan of amoxicillin [phone]    COMMUNITY REFERRALS UPON DISCHARGE:    Home Health:   PT & OT                Agency:[facility] Phone:  [phone]  Medical Equipment/Items Ordered: HAS ALL NEEDED EQUIPMENT FROM PAST ADMISSIONS                                                 Agency/Supplier:NA    My questions have been answered and I understand these instructions. I will adhere to these goals and the provided educational materials after my discharge from the hospital.  Patient/Caregiver Signature _______________________________ Date __________  Clinician Signature _______________________________________ Date __________  Please bring this form and your medication list with you to all your follow-up doctor's appointments.

## 2021-04-04 NOTE — Progress Notes (Signed)
Physical Therapy Session Note  Patient Details  Name: Marco Kennedy MRN: [id number] Date of Birth: Mar 31, 1951  Today's Date: 04/04/2021 PT Individual Time: 8144-8185 PT Individual Time Calculation (min): 55 min   Short Term Goals: Week 1:  PT Short Term Goal 1 (Week 1): Pt will tolerate sitting upright x 5 min with no more than 5/10 pain PT Short Term Goal 2 (Week 1): Pt will initiate w/c mobility PT Short Term Goal 3 (Week 1): Pt will perform bed mobility with mod A consistently adhering to back precautions  Skilled Therapeutic Interventions/Progress Updates:    Pt received seated in recliner chair, agreeable to PT session. Pt in obvious discomfort from sitting up in recliner chair with minimal ability to reposition himself independently. Pt reports ongoing whole body pain, not rated. Encouraged pt to call for nursing to assist with repositioning for comfort as needed. Pt agreeable to return to bed for position change. Slide board transfer bed to w/c with max A x 2. Pt has severe increase in pain in L mid-lower back region with transfer that improves at rest but is still present. Sitting balance EOB with min A while palpating back musculature. Pt with palpable tightness in L lats. Pt returned to supine with assist x 2 for BLE management and trunk control. Supine to R sidelying with mod A. Performed STM and trigger point release to L lats while pt in sidelying position. Pt reports some relief of pain with manual therapy. Attempted to obtain hot pack for patient, currently unavailable on the floor but recommending use of hot pack for pain management and muscle relaxation as well. Pt left in semi-R sidelying in bed with needs in reach, bed alarm in place.  Therapy Documentation Precautions:  Precautions Precautions: Fall, Back Precaution Booklet Issued: No Precaution Comments: reviewed back precautions Required Braces or Orthoses: Spinal Brace Spinal Brace: Thoracolumbosacral orthotic,  Applied in sitting position Spinal Brace Comments: TLSO when OOB only Restrictions Weight Bearing Restrictions: No     Therapy/Group: Individual Therapy   Peter Congo, PT, DPT, CSRS  04/04/2021, 5:08 PM

## 2021-04-05 MED ORDER — HYDROMORPHONE HCL 2 MG PO TABS: 2.0000 mg | ORAL_TABLET | Freq: Four times a day (QID) | ORAL | Status: DC | PRN

## 2021-04-05 MED ORDER — METHOCARBAMOL 500 MG PO TABS
500.0000 mg | ORAL_TABLET | Freq: Two times a day (BID) | ORAL | Status: DC | PRN
  Administered 2021-04-06: 500 mg via ORAL
  Filled 2021-04-05: qty 1

## 2021-04-05 MED ORDER — TIZANIDINE HCL 2 MG PO TABS
2.0000 mg | ORAL_TABLET | Freq: Three times a day (TID) | ORAL | Status: DC
  Administered 2021-04-05 – 2021-05-08 (×100): 2 mg via ORAL
  Filled 2021-04-05 (×100): qty 1

## 2021-04-05 NOTE — Progress Notes (Signed)
PROGRESS NOTE   Subjective/Complaints: Pt with significant back pain with any type of movement. Therapy was a struggle yesterday. Hesitant to get out of bed to recliner or chair even today. Using all available meds for pain  ROS: Patient denies fever, rash, sore throat, blurred vision, nausea, vomiting, diarrhea, cough, shortness of breath or chest pain, headache, or mood change.    Objective:   No results found. Recent Labs    04/03/21 0439  WBC 15.5*  HGB 8.4*  HCT 26.8*  PLT 349   Recent Labs    04/03/21 0439  NA 130*  K 3.8  CL 96*  CO2 27  GLUCOSE 132*  BUN 5*  CREATININE 0.47*  CALCIUM 8.4*    Intake/Output Summary (Last 24 hours) at 04/05/2021 0845 Last data filed at 04/05/2021 0545 Gross per 24 hour  Intake 476 ml  Output 700 ml  Net -224 ml        Physical Exam: Vital Signs Blood pressure 130/72, pulse 87, temperature 98.4 F (36.9 C), temperature source Oral, resp. rate 18, height 5\' 8"  (1.727 m), weight 119.9 kg, SpO2 97 %.  Constitutional: No distress . Vital signs reviewed. obese HEENT: NCAT, EOMI, oral membranes moist Neck: supple Cardiovascular: RRR without murmur. No JVD    Respiratory/Chest: CTA Bilaterally without wheezes or rales. Normal effort    GI/Abdomen: BS +, non-tender, non-distended Ext: no clubbing, cyanosis, or edema Psych: pleasant and cooperative  Skin: surgical site clean, dressed. Neuro:  Alert and oriented x 3. Normal insight and awareness. Intact Memory. Normal language and speech. Cranial nerve exam unremarkable. UE 5/5, LE 3+ prox to 4/5 distally. No focal sensory findings. Lower ext Movement remains quite limited by pain Musculoskeletal: mid-low back pain. No  LE edema    Assessment/Plan: 1. Functional deficits which require 3+ hours per day of interdisciplinary therapy in a comprehensive inpatient rehab setting. Physiatrist is providing close team supervision  and 24 hour management of active medical problems listed below. Physiatrist and rehab team continue to assess barriers to discharge/monitor patient progress toward functional and medical goals  Care Tool:  Bathing    Body parts bathed by patient: Right arm, Left arm, Chest, Abdomen, Front perineal area, Face   Body parts bathed by helper: Buttocks, Right upper leg, Left upper leg, Right lower leg, Left lower leg     Bathing assist Assist Level: Maximal Assistance - Patient 24 - 49%     Upper Body Dressing/Undressing Upper body dressing   What is the patient wearing?: Pull over shirt    Upper body assist Assist Level: Maximal Assistance - Patient 25 - 49%    Lower Body Dressing/Undressing Lower body dressing      What is the patient wearing?: Pants, Incontinence brief     Lower body assist Assist for lower body dressing: Total Assistance - Patient < 25%     Toileting Toileting    Toileting assist Assist for toileting: Set up assist Assistive Device Comment: able to manage urinal with set-up assist; reports need for suppository for BM   Transfers Chair/bed transfer  Transfers assist     Chair/bed transfer assist level: 2 Helpers (squat-pivot to drop-arm  recliner)     Locomotion Ambulation   Ambulation assist   Ambulation activity did not occur: Safety/medical concerns          Walk 10 feet activity   Assist  Walk 10 feet activity did not occur: Safety/medical concerns        Walk 50 feet activity   Assist Walk 50 feet with 2 turns activity did not occur: Safety/medical concerns         Walk 150 feet activity   Assist Walk 150 feet activity did not occur: Safety/medical concerns         Walk 10 feet on uneven surface  activity   Assist Walk 10 feet on uneven surfaces activity did not occur: Safety/medical concerns         Wheelchair     Assist Is the patient using a wheelchair?: Yes Type of Wheelchair: Manual     Wheelchair assist level: Dependent - Patient 0%      Wheelchair 50 feet with 2 turns activity    Assist        Assist Level: Dependent - Patient 0%   Wheelchair 150 feet activity     Assist      Assist Level: Dependent - Patient 0%   Blood pressure 130/72, pulse 87, temperature 98.4 F (36.9 C), temperature source Oral, resp. rate 18, height 5\' 8"  (1.727 m), weight 119.9 kg, SpO2 97 %.  Medical Problem List and Plan: 1.  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 05/31/2021.  TLSO back brace when out of bed. JP drain removed 10/7.2022             -patient may shower             -ELOS/Goals: 18-21 days  -Continue CIR therapies including PT, OT  2.  Impaired mobility: continue lovenox. Check vascular study             -antiplatelet therapy: N/A 3. Postoperative spinal pain: Voltaren 75 mg twice daily, Robaxin and oxycodone as needed. Added back dilaudid 1mg  q6H prn given severity of pain.   -10/9 adjust dilaudid to 2mg  q6 prn  -add tizanidine 2mg  tid for muscle spasms, continue robaxin q12 prn only  -encouraged pt to get oob as much as possible 4. Mood: Provide emotional support             -antipsychotic agents: N/A 5. Neuropsych: This patient is capable of making decisions on his own behalf. 6. Skin/Wound Care: Routine skin checks 7. Fluids/Electrolytes/Nutrition: Routine in and outs with follow-up chemistries 8.  ID.  Transitioned from intravenous cefazolin to amoxicillin.  Will discuss duration of antibiotic with infectious disease. 9.  Acute blood loss anemia.  Latest hemoglobin 8.4.  Follow-up CBC monday 10.  Hyponatremia.  Sodium  131-134.  Follow-up chemistries 11.  Hypertension.  Continue Norvasc 5 mg daily.    -fair control so far despite pain 12.  Irritable bowel syndrome.  Amitiza 24 mcg twice daily 13.  History of gout.  Continue colchicine 14.  OSA.  CPAP 15.  Obesity.  Dietary follow-up    LOS: 2 days A FACE TO FACE EVALUATION WAS PERFORMED  04/05/2021, 8:45 AM

## 2021-04-06 LAB — COMPREHENSIVE METABOLIC PANEL
ALT: 9 U/L (ref 0–44)
AST: 19 U/L (ref 15–41)
Albumin: 2.4 g/dL — ABNORMAL LOW (ref 3.5–5.0)
Alkaline Phosphatase: 68 U/L (ref 38–126)
Anion gap: 8 (ref 5–15)
BUN: 11 mg/dL (ref 8–23)
CO2: 30 mmol/L (ref 22–32)
Calcium: 8.6 mg/dL — ABNORMAL LOW (ref 8.9–10.3)
Chloride: 98 mmol/L (ref 98–111)
Creatinine, Ser: 0.65 mg/dL (ref 0.61–1.24)
GFR, Estimated: >60 mL/min (ref >60)
Glucose, Bld: 156 mg/dL — ABNORMAL HIGH (ref 70–99)
Potassium: 4.1 mmol/L (ref 3.5–5.1)
Sodium: 136 mmol/L (ref 135–145)
Total Bilirubin: 0.5 mg/dL (ref 0.3–1.2)
Total Protein: 5.7 g/dL — ABNORMAL LOW (ref 6.5–8.1)

## 2021-04-06 LAB — CBC WITH DIFFERENTIAL/PLATELET
Abs Immature Granulocytes: 0.05 10*3/uL (ref 0.00–0.07)
Basophils Absolute: 0 10*3/uL (ref 0.0–0.1)
Basophils Relative: 1 %
Eosinophils Absolute: 0.2 10*3/uL (ref 0.0–0.5)
Eosinophils Relative: 3 %
HCT: 27.3 % — ABNORMAL LOW (ref 39.0–52.0)
Hemoglobin: 8.5 g/dL — ABNORMAL LOW (ref 13.0–17.0)
Immature Granulocytes: 1 %
Lymphocytes Relative: 9 %
Lymphs Abs: 0.6 10*3/uL — ABNORMAL LOW (ref 0.7–4.0)
MCH: 27.2 pg (ref 26.0–34.0)
MCHC: 31.1 g/dL (ref 30.0–36.0)
MCV: 87.2 fL (ref 80.0–100.0)
Monocytes Absolute: 0.5 10*3/uL (ref 0.1–1.0)
Monocytes Relative: 7 %
Neutro Abs: 5.3 10*3/uL (ref 1.7–7.7)
Neutrophils Relative %: 79 %
Platelets: 406 10*3/uL — ABNORMAL HIGH (ref 150–400)
RBC: 3.13 MIL/uL — ABNORMAL LOW (ref 4.22–5.81)
RDW: 14.8 % (ref 11.5–15.5)
WBC: 6.7 10*3/uL (ref 4.0–10.5)
nRBC: 0 % (ref 0.0–0.2)

## 2021-04-06 LAB — AEROBIC/ANAEROBIC CULTURE W GRAM STAIN (SURGICAL/DEEP WOUND)
Culture: NO GROWTH
Culture: NO GROWTH
Culture: NO GROWTH

## 2021-04-06 MED ORDER — METHOCARBAMOL 750 MG PO TABS
750.0000 mg | ORAL_TABLET | Freq: Two times a day (BID) | ORAL | Status: DC | PRN
  Administered 2021-04-07 – 2021-04-24 (×6): 750 mg via ORAL
  Filled 2021-04-06 (×7): qty 1

## 2021-04-06 NOTE — Progress Notes (Signed)
PROGRESS NOTE   Subjective/Complaints:  Pt still having severe pain- not doing as well as last time was here in CIR- added hardware- and pain is now middle back, not low back.  Also has associated tight musculature in back- already on Zanaflex TID AND prn robaxin 500 mg BID prn.  Also on Oxycodone 5 mg q4 hours prn and dilaudid 2 mg q6 hours prn- not on any scheduled meds.    LBM this AM x2- was having difficulties in acute, but doing better with bowels.  Still very numb.   ROS:  Pt denies SOB, abd pain, CP, N/V/C/D, and vision changes   Objective:   No results found. Recent Labs    04/06/21 0735  WBC 6.7  HGB 8.5*  HCT 27.3*  PLT 406*   Recent Labs    04/06/21 0735  NA 136  K 4.1  CL 98  CO2 30  GLUCOSE 156*  BUN 11  CREATININE 0.65  CALCIUM 8.6*    Intake/Output Summary (Last 24 hours) at 04/06/2021 1830 Last data filed at 04/06/2021 1321 Gross per 24 hour  Intake 520 ml  Output 825 ml  Net -305 ml        Physical Exam: Vital Signs Blood pressure 108/63, pulse 62, temperature 98.7 F (37.1 C), resp. rate 17, height 5\' 8"  (1.727 m), weight 119.9 kg, SpO2 96 %.   General: awake, alert, appropriate, laying in bed- asking to get on bedpan; NAD HENT: conjugate gaze; oropharynx moist CV: regular rate; no JVD Pulmonary: CTA B/L; no W/R/R- good air movement GI: soft, NT, ND, (+)BS Psychiatric: appropriate; flat; refusing to turn or get OOB- said they were using hoyer lift.  Neurological: alert  Skin: surgical site clean, dressed. Neuro:  Alert and oriented x 3. Normal insight and awareness. Intact Memory. Normal language and speech. Cranial nerve exam unremarkable. UE 5/5, LE 3+ prox to 4/5 distally. No focal sensory findings. Lower ext Movement remains quite limited by pain Musculoskeletal: mid-low back pain. No  LE edema    Assessment/Plan: 1. Functional deficits which require 3+ hours per day  of interdisciplinary therapy in a comprehensive inpatient rehab setting. Physiatrist is providing close team supervision and 24 hour management of active medical problems listed below. Physiatrist and rehab team continue to assess barriers to discharge/monitor patient progress toward functional and medical goals  Care Tool:  Bathing    Body parts bathed by patient: Right arm, Left arm, Chest, Abdomen, Front perineal area, Face   Body parts bathed by helper: Buttocks, Right upper leg, Left upper leg, Right lower leg, Left lower leg     Bathing assist Assist Level: Maximal Assistance - Patient 24 - 49%     Upper Body Dressing/Undressing Upper body dressing   What is the patient wearing?: Pull over shirt    Upper body assist Assist Level: Maximal Assistance - Patient 25 - 49%    Lower Body Dressing/Undressing Lower body dressing      What is the patient wearing?: Pants, Incontinence brief     Lower body assist Assist for lower body dressing: Total Assistance - Patient < 25%     Toileting Toileting  Toileting assist Assist for toileting: Dependent - Patient 0% (bed pan, bed level) Assistive Device Comment: able to manage urinal with set-up assist; reports need for suppository for BM   Transfers Chair/bed transfer  Transfers assist     Chair/bed transfer assist level: Dependent - mechanical lift     Locomotion Ambulation   Ambulation assist   Ambulation activity did not occur: Safety/medical concerns          Walk 10 feet activity   Assist  Walk 10 feet activity did not occur: Safety/medical concerns        Walk 50 feet activity   Assist Walk 50 feet with 2 turns activity did not occur: Safety/medical concerns         Walk 150 feet activity   Assist Walk 150 feet activity did not occur: Safety/medical concerns         Walk 10 feet on uneven surface  activity   Assist Walk 10 feet on uneven surfaces activity did not occur:  Safety/medical concerns         Wheelchair     Assist Is the patient using a wheelchair?: Yes Type of Wheelchair: Manual    Wheelchair assist level: Dependent - Patient 0%      Wheelchair 50 feet with 2 turns activity    Assist        Assist Level: Dependent - Patient 0%   Wheelchair 150 feet activity     Assist      Assist Level: Dependent - Patient 0%   Blood pressure 108/63, pulse 62, temperature 98.7 F (37.1 C), resp. rate 17, height 5\' 8"  (1.727 m), weight 119.9 kg, SpO2 96 %.  Medical Problem List and Plan: 1.  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 05/31/2021.  TLSO back brace when out of bed. JP drain removed 10/7.2022             -patient may shower             -ELOS/Goals: 18-21 days  -con't PT and OT/CIR- will need to start getting OOB-  2.  Impaired mobility: continue lovenox. Check vascular study             -antiplatelet therapy: N/A 3. Postoperative spinal pain: Voltaren 75 mg twice daily, Robaxin and oxycodone as needed. Added back dilaudid 1mg  q6H prn given severity of pain.   -10/9 adjust dilaudid to 2mg  q6 prn  -add tizanidine 2mg  tid for muscle spasms, continue robaxin q12 prn only  -encouraged pt to get oob as much as possible 10/10- changed robaxin to 750 mg q12 hours- prn- might change one of short acting pain meds to long acting? Will d/w pt.  4. Mood: Provide emotional support  10/10- pt appears depressed- will d/w pt in AM about starting something that could also help pain.              -antipsychotic agents: N/A 5. Neuropsych: This patient is capable of making decisions on his own behalf. 6.  Skin/Wound Care: Routine skin checks 7. Fluids/Electrolytes/Nutrition: Routine in and outs with follow-up chemistries 8.  ID.  Transitioned from intravenous cefazolin to amoxicillin.  Will discuss duration of antibiotic with infectious disease. 9.  Acute blood loss anemia.  Latest  hemoglobin 8.4.  Follow-up CBC Monday  10/10- Hb 8.5- con't to monitor- is stable 10.  Hyponatremia.  Sodium 131-134.  Follow-up chemistries 11.  Hypertension.  Continue Norvasc 5 mg daily.    -fair control so far despite pain 12.  Irritable bowel syndrome.  Amitiza 24 mcg twice daily 13.  History of gout.  Continue colchicine 14.  OSA.  CPAP 15.  Obesity.  Dietary follow-up    LOS: 3 days A FACE TO FACE EVALUATION WAS PERFORMED  Zamyra Allensworth 04/06/2021, 6:30 PM

## 2021-04-06 NOTE — Progress Notes (Signed)
Inpatient Rehabilitation Care Coordinator Assessment and Plan Patient Details  Name: Marco Kennedy MRN: [id number] Date of Birth: 1950/12/15  Today's Date: 04/06/2021  Hospital Problems: Principal Problem:   Epidural abscess  Past Medical History:  Past Medical History:  Diagnosis Date   Arthritis    arthritis-bilateral knees, left Hip.   Degenerative lumbar spinal stenosis    EKG abnormality    06-08-16- being evaluated by cardiologist  in [place]   Idiopathic scoliosis of lumbar region    Joint stiffness    Knee pain    Leg swelling    Low back pain    Achy, shooting pain.  Hurts to Walk or stand up straight.   Lumbar radiculopathy    Scoliosis concern    Sleep apnea    Bipap use nightly 25/19 -3l/m oxygen   Spondylolisthesis of lumbar region    Transfusion history    [place] ,[place] after bleeding post colon polyp removal and colonscopy procedure   Past Surgical History:  Past Surgical History:  Procedure Laterality Date   ABDOMINAL EXPOSURE N/A 04/18/2020   Procedure: ABDOMINAL EXPOSURE;  Surgeon: [name], MD;  Location: [facility] OR;  Service: Vascular;  Laterality: N/A;   ANTERIOR LAT LUMBAR FUSION Left 04/18/2020   Procedure: ANTERIOR LATERAL LUMBAR FUSION LUMBAR TWO-THREE, LUMBAR THREE-FOUR;  Surgeon: [name], MD;  Location: [facility] OR;  Service: Neurosurgery;  Laterality: Left;   ANTERIOR LUMBAR FUSION N/A 04/18/2020   Procedure: Lumbar Four-Five Lumbar Five- Sacral One Anterior lumbar Interbody Fusion;  Surgeon: [name], MD;  Location: [facility] OR;  Service: Neurosurgery;  Laterality: N/A;   APPLICATION OF INTRAOPERATIVE CT SCAN N/A 04/21/2020   Procedure: APPLICATION OF INTRAOPERATIVE CT SCAN;  Surgeon: [name], MD;  Location: [facility] OR;  Service: Neurosurgery;  Laterality: N/A;   BACK SURGERY     lumbar disc surgery   COLONOSCOPY W/ POLYPECTOMY     HERNIA REPAIR Left    left inguinal hernia repair   IR RADIOLOGIST EVAL & MGMT  02/24/2021    KNEE ARTHROSCOPY Right 2003    -scope for meniscus   LUMBAR WOUND DEBRIDEMENT N/A 01/13/2021   Procedure: THORACOLUMBAR WOUND DEBRIDEMENT;  Surgeon: [name], MD;  Location: [facility] OR;  Service: Neurosurgery;  Laterality: N/A;   LUMBAR WOUND DEBRIDEMENT N/A 03/14/2021   Procedure: WOUND EXPLORATION AND DEBRIDEMENT OF ABSCESS;  Surgeon: Lisbeth Renshaw, MD;  Location: [facility] OR;  Service: Neurosurgery;  Laterality: N/A;   POSTERIOR LUMBAR FUSION 4 LEVEL N/A 04/21/2020   Procedure: Thoracic ten to pelvis pedicle screw fixation with osteotomies;  Surgeon: [name], MD;  Location: [facility] OR;  Service: Neurosurgery;  Laterality: N/A;   POSTERIOR LUMBAR FUSION 4 LEVEL N/A 03/31/2021   Procedure: Thoracic six to Thoracic nine  Posterior decompression/Fusion with removal hardware thoracic ten;  Surgeon: [name], MD;  Location: [facility] OR;  Service: Neurosurgery;  Laterality: N/A;   THORACIC LAMINECTOMY FOR EPIDURAL ABSCESS N/A 03/14/2021   Procedure: THORACIC LAMINECTOMY, THORACIC NINE-THORACIC TEN;  Surgeon: Lisbeth Renshaw, MD;  Location: [facility] OR;  Service: Neurosurgery;  Laterality: N/A;   TOTAL HIP ARTHROPLASTY Left 06/15/2016   Procedure: LEFT TOTAL HIP ARTHROPLASTY ANTERIOR APPROACH;  Surgeon: [name], MD;  Location: [facility] ORS;  Service: Orthopedics;  Laterality: Left;   Social History:  reports that he has never smoked. He has never used smokeless tobacco. He reports that he does not currently use alcohol. He reports that he does not use drugs.  Family / Support Systems Marital Status: Married Patient  Roles: Spouse, Parent Spouse/Significant Other: [name] [phone]  [phone]-home Children: Eliberto Ivory daughter [phone] cell [place]  Son in [place] Other Supports: Friends and church members Anticipated Caregiver: [name] Ability/Limitations of Caregiver: [name] does work but can take time off and work some from home Caregiver Availability: 24/7 Family Dynamics: Close knit family and  friends, wife is his main caregiver and will be providing assist. Pt has been through a lot and wants the surgeries and rehab to be finished  Social History Preferred language: English Religion: Baptist Cultural Background: No issues Education: Teaching laboratory technician - How often do you need to have someone help you when you read instructions, pamphlets, or other written material from your doctor or pharmacy?: Rarely Writes: Yes Employment Status: Employed Name of Employer: [employer] Return to Work Plans: Was still recovering and not back at work yet Marine scientist Issues: No issues Guardian/Conservator: none-according to MD pt is capable of making his own decisions while here   Abuse/Neglect Abuse/Neglect Assessment Can Be Completed: Yes Physical Abuse: Denies Verbal Abuse: Denies Sexual Abuse: Denies Exploitation of patient/patient's resources: Denies Self-Neglect: Denies  Patient response to: Social Isolation - How often do you feel lonely or isolated from those around you?: Never  Emotional Status Pt's affect, behavior and adjustment status: Pt feels this has been a long road and is hoping this last surgery is his last so he can recover from this and move forward. He feels like he takes one step forward and two steps back. He is willing to work on rehab and progress so he is not so much care at home. Recent Psychosocial Issues: back issues and multiple surgeries and antibiotics Psychiatric History: No history  has been through much since July and still trying to move forward. May benefit from seeing neuro-psych while here Substance Abuse History: None  Patient / Family Perceptions, Expectations & Goals Pt/Family understanding of illness & functional limitations: Pt and wife talk with the surgeon and feel they have a good understanding of his treatment plan and moving forward. Both hope this is the last surgery and can move forward this time and pt heal  and recover. Premorbid pt/family roles/activities: Husband, father, employee, neighbor, church member Anticipated changes in roles/activities/participation: resume Pt/family expectations/goals: Pt states: " This has kicked my butt and I need to do a lot of work here to recover and get home."  Wife states: " I am glad he is back here and hopeful he will do well again."  [employer]: Other (Comment) ([facility]) Premorbid Home Care/DME Agencies: Other (Comment) (has wc, cane, rw, riser, bsc) Transportation available at discharge: Wife Is the patient able to respond to transportation needs?: Yes In the past 12 months, has lack of transportation kept you from medical appointments or from getting medications?: No In the past 12 months, has lack of transportation kept you from meetings, work, or from getting things needed for daily living?: No Resource referrals recommended: Neuropsychology  Discharge Planning Living Arrangements: Spouse/significant other Support Systems: Spouse/significant other, Children, Friends/neighbors, Psychologist, clinical community Type of Residence: Private residence Insurance Resources: Media planner (specify), Medicare (Pharmacologist) Financial Resources: Family Support, Employment Financial Screen Referred: No Living Expenses: Own Money Management: Patient, Spouse Does the patient have any problems obtaining your medications?: No Home Management: wife Patient/Family Preliminary Plans: Return home with wife who can take time off and work some from home to provide assist to him. He was doing well at home prior to routine MRI and the infection had not  cleared. Aware of rehab process was just here in 01/2021 Care Coordinator Anticipated Follow Up Needs: HH/OP  Clinical Impression Pleasant gentleman who is ready to recover and regain his independence. He is tired of taking one step forward and two steps back in this recovery process. His wife is  involved and will assist him, trying to work now while he is in the hospital. Will work on discharge needs.   Lucy Chris 04/06/2021, 10:42 AM

## 2021-04-06 NOTE — Progress Notes (Signed)
Inpatient Rehabilitation Center Individual Statement of Services  Patient Name:  Marco Kennedy  Date:  04/06/2021  Welcome to the Inpatient Rehabilitation Center.  Our goal is to provide you with an individualized program based on your diagnosis and situation, designed to meet your specific needs.  With this comprehensive rehabilitation program, you will be expected to participate in at least 3 hours of rehabilitation therapies Monday-Friday, with modified therapy programming on the weekends.  Your rehabilitation program will include the following services:  Physical Therapy (PT), Occupational Therapy (OT), 24 hour per day rehabilitation nursing, Neuropsychology, Care Coordinator, Rehabilitation Medicine, Nutrition Services, and Pharmacy Services  Weekly team conferences will be held on Tuesday to discuss your progress.  Your Inpatient Rehabilitation Care Coordinator will talk with you frequently to get your input and to update you on team discussions.  Team conferences with you and your family in attendance may also be held.  Expected length of stay: 21-24 days  Overall anticipated outcome: min assist level  Depending on your progress and recovery, your program may change. Your Inpatient Rehabilitation Care Coordinator will coordinate services and will keep you informed of any changes. Your Inpatient Rehabilitation Care Coordinator's name and contact numbers are listed  below.  The following services may also be recommended but are not provided by the Inpatient Rehabilitation Center:  Driving Evaluations Home Health Rehabiltiation Services Outpatient Rehabilitation Services Vocational Rehabilitation   Arrangements will be made to provide these services after discharge if needed.  Arrangements include referral to agencies that provide these services.  Your insurance has been verified to be:  [employer] BCBS & Medicare part A Your primary doctor is:  John Hungarland  Pertinent information  will be shared with your doctor and your insurance company.  Inpatient Rehabilitation Care Coordinator:  Dossie Der, [place] [phone] or Luna [place]  Information discussed with and copy given to patient by: Lucy Chris, 04/06/2021, 10:45 AM

## 2021-04-06 NOTE — Progress Notes (Signed)
Inpatient Rehabilitation  Patient information reviewed and entered into eRehab system by Buffy Ehler Vassie Kugel, OTR/L.   Information including medical coding, functional ability and quality indicators will be reviewed and updated through discharge.    

## 2021-04-06 NOTE — IPOC Note (Signed)
Overall Plan of Care [facility]) Patient Details Name: MARIS BENA MRN: [id number] DOB: 1950/10/10  Admitting Diagnosis: Epidural abscess  Hospital Problems: Principal Problem:   Epidural abscess     Functional Problem List: Nursing Edema, Endurance, Medication Management, Pain, Motor, Safety, Skin Integrity  PT Balance, Motor, Endurance, Pain, Safety, Sensory  OT Balance, Endurance, Motor, Pain, Safety, Sensory  SLP    TR         Basic ADL's: OT Grooming, Bathing, Dressing, Toileting     Advanced  ADL's: OT       Transfers: PT Bed Mobility, Bed to Chair, Car, Furniture, Civil Service fast streamer, Research scientist (life sciences): PT Ambulation, Psychologist, prison and probation services, Stairs     Additional Impairments: OT None  SLP        TR      Anticipated Outcomes Item Anticipated Outcome  Self Feeding N/A  Swallowing      Basic self-care  Min LB, Min transfers  Toileting  Min A   Bathroom Transfers Min A  Bowel/Bladder  n/a  Transfers  min A  Locomotion  min A at w/c level  Communication     Cognition     Pain  < 3  Safety/Judgment  Supervision and no falls   Therapy Plan: PT Intensity: Minimum of 1-2 x/day ,45 to 90 minutes PT Frequency: 5 out of 7 days PT Duration Estimated Length of Stay: 21-24 days OT Intensity: Minimum of 1-2 x/day, 45 to 90 minutes OT Frequency: 5 out of 7 days OT Duration/Estimated Length of Stay: 3 weeks     Due to the current state of emergency, patients may not be receiving their 3-hours of Medicare-mandated therapy.   Team Interventions: Nursing Interventions Patient/Family Education, Disease Management/Prevention, Pain Management, Medication Management, Skin Care/Wound Management, Discharge Planning  PT interventions Ambulation/gait training, Balance/vestibular training, Community reintegration, Discharge planning, Disease management/prevention, DME/adaptive equipment instruction, Functional electrical stimulation, Functional mobility  training, Neuromuscular re-education, Pain management, Psychosocial support, Patient/family education, Splinting/orthotics, Stair training, Therapeutic Activities, Therapeutic Exercise, UE/LE Strength taining/ROM, UE/LE Coordination activities, Wheelchair propulsion/positioning  OT Interventions Balance/vestibular training, Discharge planning, Pain management, Self Care/advanced ADL retraining, Therapeutic Activities, UE/LE Coordination activities, Visual/perceptual remediation/compensation, Therapeutic Exercise, Skin care/wound managment, Patient/family education, Functional mobility training, Disease mangement/prevention, Community reintegration, Fish farm manager, Neuromuscular re-education, Psychosocial support, UE/LE Strength taining/ROM, Wheelchair propulsion/positioning  SLP Interventions    TR Interventions    SW/CM Interventions Discharge Planning, Psychosocial Support, Patient/Family Education   Barriers to Discharge MD  Medical stability, Home enviroment access/loayout, Neurogenic bowel and bladder, Wound care, Lack of/limited family support, Weight, Weight bearing restrictions, Behavior, and difficulty getting oob due to pain  Nursing Decreased caregiver support, Home environment access/layout, Wound Care, Lack of/limited family support, Weight, Weight bearing restrictions, Medication compliance Lives with spouse in 2 level home with level entry in the back. Spouse can provide assist at discharge.  PT Inaccessible home environment, Decreased caregiver support, Home environment access/layout, Other (comments) (pain)    OT Inaccessible home environment    SLP      SW       Team Discharge Planning: Destination: PT-Home ,OT- Home , SLP-  Projected Follow-up: PT-Home health PT, OT-  Home health OT, SLP-  Projected Equipment Needs: PT-None recommended by PT, OT- None recommended by OT, SLP-  Equipment Details: PT-pt already owns w/c, RW, rollator, OT-Patient has  necessary DME. Patient/family involved in discharge planning: PT- Patient,  OT-Patient, SLP-   MD ELOS: 21-24 days Medical Rehab Prognosis:  Fair Assessment: Pt is a 70 yr old male with epidural abscess s/p decompression/removal of hardware and addition of more hardware - on Amoxicillin now- undetermined length of time- Also has IBS, severe uncontrolled pain- might need long acting pain meds and muscle spasms; and HTN, hyponatremia.    Goals min A if possible.    See Team Conference Notes for weekly updates to the plan of care

## 2021-04-06 NOTE — Progress Notes (Signed)
Physical Therapy Session Note  Patient Details  Name: Marco Kennedy MRN: [id number] Date of Birth: 07-Nov-1950  Today's Date: 04/06/2021 PT Individual Time: 1015-1130 PT Individual Time Calculation (min): 75 min   Short Term Goals: Week 1:  PT Short Term Goal 1 (Week 1): Pt will tolerate sitting upright x 5 min with no more than 5/10 pain PT Short Term Goal 2 (Week 1): Pt will initiate w/c mobility PT Short Term Goal 3 (Week 1): Pt will perform bed mobility with mod A consistently adhering to back precautions  Skilled Therapeutic Interventions/Progress Updates:    Pt received in recliner and agreeable to therapy.  Pt reported feeling comfortable at rest but up to 9/10 pain with activity. Session focused on pt education, discussing prognosis, possible needs for d/c, etc. Pt performed the following exercises in sitting to promote LE strength and endurance: LAQ, marchers, hip abduction, hip adduction. Dependent transfer to bed with maxi move. Heel slides 2 x 10 BIL with assist to reach full ROM and tapping facilitation at hamstrings. Pt then directed in rolling R and L with mod A and VC for technique and sequencing.  Discussed positioning to allow pt to roll and self position in bed, pt agreeable. Continued conversation on possible holistic pain interventions, home set up and prognosis. Pt remained in bed at end of session and was left with all needs in reach and alarm active.   Therapy Documentation Precautions:  Precautions Precautions: Fall, Back Precaution Booklet Issued: No Precaution Comments: reviewed back precautions Required Braces or Orthoses: Spinal Brace Spinal Brace: Thoracolumbosacral orthotic, Applied in sitting position Spinal Brace Comments: TLSO when OOB only Restrictions Weight Bearing Restrictions: No    Therapy/Group: Individual Therapy  [name] 04/06/2021, 12:43 PM

## 2021-04-06 NOTE — Progress Notes (Signed)
Occupational Therapy Session Note  Patient Details  Name: Marco Kennedy MRN: [id number] Date of Birth: 07-Jul-1950  Today's Date: 04/06/2021 OT Individual Time: 3009-2330 and 0762-2633 OT Individual Time Calculation (min): 54 min  and 43 min   Short Term Goals: Week 1:  OT Short Term Goal 1 (Week 1): Patient will don UB clothing seated EOB with Min A. OT Short Term Goal 2 (Week 1): Patient will complete squat-pivot transfers with Mod A +2. OT Short Term Goal 3 (Week 1): Patient will don LB clothing at bed level with Mod A of 1 helper and AD PRN.  Skilled Therapeutic Interventions/Progress Updates:    Pt greeted at time of session semireclined in bed finishing up with lab tech. Pt reports no pain in resting position but states pain increases with movement. Later in session with mobility pt in significant back pain, did not provide # rating, improved with change in position and RN aware. When reclining pt for HOB downward for LB dressing, pt stating needing to have BM. Rolled to L side with Min/Mod A and placement of bed pan, total A for hygiene. LB dressing at bed level total A with assist to thread and pt attempting to assist get over hips, but therapist eventually needing to assist with pt in side lying to get over hips. Attempted to locate stedy and 2nd helper, unable at this time. RN assisting with Maximove transfer to recliner. Set up with TLSO on, reclined in chair, alarm on call bell in reach finishing up med pass with RN.  Session 2: Pt greeted at time of session supine in bed with maxipad under pt and TLSO on. No pain resting but only c/o minimal pain (no # given) during bed mobility later in session which relieved with rest. Pt rolling L/R with Min A for removal of TLSO and maxipad. Offered to doff in sitting at EOB but pt stating he did not feel able to at this time. Remainder of session on BLE AROM/AAROM for the following for 2x10: knee extensions, hip abduction/adduction with gait  belt assist, heel slides with Mod therapist assist 2/2 LE weakness. Pt scooting self up in bed, cues not to strain back with Min/Mod A. Set up alarm on call bell in reach.   Therapy Documentation Precautions:  Precautions Precautions: Fall, Back Precaution Booklet Issued: No Precaution Comments: reviewed back precautions Required Braces or Orthoses: Spinal Brace Spinal Brace: Thoracolumbosacral orthotic, Applied in sitting position Spinal Brace Comments: TLSO when OOB only Restrictions Weight Bearing Restrictions: No     Therapy/Group: Individual Therapy  Erasmo Score 04/06/2021, 7:15 AM

## 2021-04-07 MED ORDER — DULOXETINE HCL 30 MG PO CPEP
30.0000 mg | ORAL_CAPSULE | Freq: Every day | ORAL | Status: DC
  Administered 2021-04-07 – 2021-04-12 (×6): 30 mg via ORAL
  Filled 2021-04-07 (×6): qty 1

## 2021-04-07 MED ORDER — MORPHINE SULFATE ER 15 MG PO TBCR
15.0000 mg | EXTENDED_RELEASE_TABLET | Freq: Two times a day (BID) | ORAL | Status: DC
  Administered 2021-04-07 – 2021-04-22 (×31): 15 mg via ORAL
  Filled 2021-04-07 (×31): qty 1

## 2021-04-07 NOTE — Progress Notes (Signed)
Physical Therapy Session Note  Patient Details  Name: Marco Kennedy MRN: [id number] Date of Birth: [birth year]  Today's Date: 04/07/2021 PT Individual Time: 1400-1500 PT Individual Time Calculation (min): 60 min   Short Term Goals: Week 1:  PT Short Term Goal 1 (Week 1): Pt will tolerate sitting upright x 5 min with no more than 5/10 pain PT Short Term Goal 2 (Week 1): Pt will initiate w/c mobility PT Short Term Goal 3 (Week 1): Pt will perform bed mobility with mod A consistently adhering to back precautions  Skilled Therapeutic Interventions/Progress Updates:    Pt seated in w/c on arrival and agreeable to therapy. Pt reports 4/10 pain at rest, rest breaks as needed. Pt propelled w/c with BUE 2 x 200 ft with supervision for functional mobility and endurance. Attempted to utilize sara plus lift for standing. Therapist was able to position sling appropriately, but was unable to find sara lift with full battery. Pt directed in anterior weight shifts with assist to come into upright position and VC for posture, core engagement, and decreased UE support. Pt returned to room and remained in w/c, was left with all needs in reach and alarm active.   Therapy Documentation Precautions:  Precautions Precautions: Fall, Back Precaution Booklet Issued: No Precaution Comments: reviewed back precautions Required Braces or Orthoses: Spinal Brace Spinal Brace: Thoracolumbosacral orthotic, Applied in sitting position Spinal Brace Comments: TLSO when OOB only Restrictions Weight Bearing Restrictions: No     Therapy/Group: Individual Therapy  [name] 04/07/2021, 5:38 PM

## 2021-04-07 NOTE — Progress Notes (Addendum)
PROGRESS NOTE   Subjective/Complaints:  Pt reports pain so bad, doesn't think get sit up much at EOB, much less get OOB.  Explained needs to get up- will change meds around to help-  Will ask Tom to do myofascial release.  Spoke with pt's wife who is also concerned about the meds he's taking and pt admitted "slightly depressed"  ROS:   Pt denies SOB, abd pain, CP, N/V/C/D, and vision changes   Objective:   No results found. Recent Labs    04/06/21 0735  WBC 6.7  HGB 8.5*  HCT 27.3*  PLT 406*   Recent Labs    04/06/21 0735  NA 136  K 4.1  CL 98  CO2 30  GLUCOSE 156*  BUN 11  CREATININE 0.65  CALCIUM 8.6*    Intake/Output Summary (Last 24 hours) at 04/07/2021 1019 Last data filed at 04/07/2021 0700 Gross per 24 hour  Intake 420 ml  Output 1300 ml  Net -880 ml        Physical Exam: Vital Signs Blood pressure (!) 145/82, pulse 74, temperature 98.4 F (36.9 C), temperature source Oral, resp. rate 18, height 5\' 8"  (1.727 m), weight 119.9 kg, SpO2 96 %.    General: awake, alert, appropriate, laying supine in bed- called for bedpan from nursing; wife on facetime; NAD HENT: conjugate gaze; oropharynx moist CV: regular rate; no JVD Pulmonary: CTA B/L; no W/R/R- good air movement GI: soft, NT, ND, (+)BS; somewhat protuberant Psychiatric: depressed flat affect Neurological: alert  Skin: surgical site clean, dressed. Neuro:  Alert and oriented x 3. Normal insight and awareness. Intact Memory. Normal language and speech. Cranial nerve exam unremarkable. UE 5/5, LE 3+ prox to 4/5 distally. No focal sensory findings. Lower ext Movement remains quite limited by pain Musculoskeletal: mid-low back pain. No  LE edema    Assessment/Plan: 1. Functional deficits which require 3+ hours per day of interdisciplinary therapy in a comprehensive inpatient rehab setting. Physiatrist is providing close team supervision  and 24 hour management of active medical problems listed below. Physiatrist and rehab team continue to assess barriers to discharge/monitor patient progress toward functional and medical goals  Care Tool:  Bathing    Body parts bathed by patient: Right arm, Left arm, Chest, Abdomen, Front perineal area, Face   Body parts bathed by helper: Buttocks, Right upper leg, Left upper leg, Right lower leg, Left lower leg     Bathing assist Assist Level: Maximal Assistance - Patient 24 - 49%     Upper Body Dressing/Undressing Upper body dressing   What is the patient wearing?: Pull over shirt    Upper body assist Assist Level: Maximal Assistance - Patient 25 - 49%    Lower Body Dressing/Undressing Lower body dressing      What is the patient wearing?: Pants, Incontinence brief     Lower body assist Assist for lower body dressing: Total Assistance - Patient < 25%     Toileting Toileting    Toileting assist Assist for toileting: Dependent - Patient 0% (bed pan, bed level) Assistive Device Comment: able to manage urinal with set-up assist; reports need for suppository for BM   Transfers Chair/bed  transfer  Transfers assist     Chair/bed transfer assist level: Dependent - mechanical lift     Locomotion Ambulation   Ambulation assist   Ambulation activity did not occur: Safety/medical concerns          Walk 10 feet activity   Assist  Walk 10 feet activity did not occur: Safety/medical concerns        Walk 50 feet activity   Assist Walk 50 feet with 2 turns activity did not occur: Safety/medical concerns         Walk 150 feet activity   Assist Walk 150 feet activity did not occur: Safety/medical concerns         Walk 10 feet on uneven surface  activity   Assist Walk 10 feet on uneven surfaces activity did not occur: Safety/medical concerns         Wheelchair     Assist Is the patient using a wheelchair?: Yes Type of Wheelchair:  Manual    Wheelchair assist level: Dependent - Patient 0%      Wheelchair 50 feet with 2 turns activity    Assist        Assist Level: Dependent - Patient 0%   Wheelchair 150 feet activity     Assist      Assist Level: Dependent - Patient 0%   Blood pressure (!) 145/82, pulse 74, temperature 98.4 F (36.9 C), temperature source Oral, resp. rate 18, height 5\' 8"  (1.727 m), weight 119.9 kg, SpO2 96 %.  Medical Problem List and Plan: 1.  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 05/31/2021.  TLSO back brace when out of bed. JP drain removed 10/7.2022             -patient may shower             -ELOS/Goals: 18-21 days  -con't PT and OT/CIR_ team conference today to determine length of stay- will see if 02-12-1978 can do myofascial release.  2.  Impaired mobility: continue lovenox. Check vascular study             -antiplatelet therapy: N/A 3. Postoperative spinal pain: Voltaren 75 mg twice daily, Robaxin and oxycodone as needed. Added back dilaudid 1mg  q6H prn given severity of pain.   -10/9 adjust dilaudid to 2mg  q6 prn  -add tizanidine 2mg  tid for muscle spasms, continue robaxin q12 prn only  -encouraged pt to get oob as much as possible 10/10- changed robaxin to 750 mg q12 hours- prn- might change one of short acting pain meds to long acting? Will d/w pt.   10/11- will start ms contin 15 mg BID and stop Dliaudid and con't oxycodone- also add Cymbalta 30 mg QHS for nerve pain and mood.  4. Mood: Provide emotional support  10/10- pt appears depressed- will d/w pt in AM about starting something that could also help pain.   10/11- adding cymbalta 30 mg  QHS.              -antipsychotic agents: N/A 5. Neuropsych: This patient is capable of making decisions on his own behalf. 6. Skin/Wound Care: Routine skin checks 7. Fluids/Electrolytes/Nutrition: Routine in and outs with follow-up chemistries 8.  ID.  Transitioned from intravenous cefazolin to amoxicillin.  Will discuss duration of antibiotic with infectious disease. 9.  Acute blood loss anemia.  Latest hemoglobin 8.4.  Follow-up CBC Monday  10/10- Hb 8.5- con't to monitor- is stable 10.  Hyponatremia.  Sodium 131-134.  Follow-up chemistries 11.  Hypertension.  Continue Norvasc 5 mg daily.    -fair control so far despite pain  10/11- BP runnign low 140s/80-s con't regimen 12.  Irritable bowel syndrome.  Amitiza 24 mcg twice daily 13.  History of gout.  Continue colchicine 14.  OSA.  CPAP 15.  Obesity.  Dietary follow-up    I spent a total of 39 minutes on visit-/total care- >50% coordination of care- discussing medical issues with pt/wife prolonged period and team conference.   LOS: 4 days A FACE TO FACE EVALUATION WAS PERFORMED  Ymani Porcher 04/07/2021, 10:19 AM

## 2021-04-07 NOTE — Progress Notes (Signed)
Occupational Therapy Session Note  Patient Details  Name: Marco Kennedy MRN: [id number] Date of Birth: 04/14/1951  Today's Date: 04/07/2021 OT Individual Time: 0904-1000 OT Individual Time Calculation (min): 56 min    Short Term Goals: Week 1:  OT Short Term Goal 1 (Week 1): Patient will don UB clothing seated EOB with Min A. OT Short Term Goal 2 (Week 1): Patient will complete squat-pivot transfers with Mod A +2. OT Short Term Goal 3 (Week 1): Patient will don LB clothing at bed level with Mod A of 1 helper and AD PRN.  Skilled Therapeutic Interventions/Progress Updates:    Pt in bed with NT assisting with cleaning up after slight bowel incontinence in the brief and for using the bedpan.  He was able to roll side to side in the bed with use of the bed rails with mod assist.  After cleaning peri area, worked on bathing from bed level secondary to pt not feeling he would be able to tolerate the pain of trying to sit up on the EOB.  He was able to wash his front UB with setup and rolled again with mod assist for therapist to assist with washing his back.  He was able to donn a pullover shirt with HOB slightly elevated but still needed transition to supine for rolling to pull it down in the back.  Only able to tolerate HOB up to approximately 30 degrees before reporting increased back pain and having to lower it.  While HOB elevated, attempted use of the reacher for threading his shorts over his feet.  Therapist had to still provide max assist to help flex up the LE slightly and thread the opening.  He was able to roll with mod assist and then pull up the shorts alternating sides.  Therapist assisted with removal of gripper socks, washing and drying his feet, and donning TEDs and gripper socks.  Would benefit from trial of sockaide as well as use of LH sponge.  Pt reports having these items at home as well, but states his spouse would assist with socks and shoes.  Finished session with pt in the bed  and with the call button and phone in reach.    Therapy Documentation Precautions:  Precautions Precautions: Fall, Back Precaution Booklet Issued: No Precaution Comments: reviewed back precautions Required Braces or Orthoses: Spinal Brace Spinal Brace: Thoracolumbosacral orthotic, Applied in sitting position Spinal Brace Comments: TLSO when OOB only Restrictions Weight Bearing Restrictions: No  Pain: Pain Assessment Pain Scale: Faces Pain Score: 7  Faces Pain Scale: Hurts a little bit Pain Type: Surgical pain Pain Location: Back Pain Descriptors / Indicators: Discomfort Pain Onset: With Activity Pain Intervention(s): Repositioned ADL: See Care Tool Section for some details of mobility and selfcare   Therapy/Group: Individual Therapy  Shaniquia Brafford OTR/L 04/07/2021, 12:23 PM

## 2021-04-07 NOTE — Progress Notes (Signed)
Physical Therapy Session Note  Patient Details  Name: Marco Kennedy MRN: [id number] Date of Birth: [birth year]  Today's Date: 04/07/2021 PT Individual Time: 1530-1605 PT Individual Time Calculation (min): 35 min   Short Term Goals: Week 1:  PT Short Term Goal 1 (Week 1): Pt will tolerate sitting upright x 5 min with no more than 5/10 pain PT Short Term Goal 2 (Week 1): Pt will initiate w/c mobility PT Short Term Goal 3 (Week 1): Pt will perform bed mobility with mod A consistently adhering to back precautions   Skilled Therapeutic Interventions/Progress Updates:  Patient seated upright in w/c on entrance to room. Patient alert and agreeable to PT session. Pt relates that he has been sitting up in w/c since the start of his day and would like to remain up in chair throughout dinner.   Patient states experiencing low level pain with no numerical value and no outward appearance of pain but does relate that the tingling in his legs is getting stronger.   Wheelchair Mobility:  Patient propelled wheelchair 135 ft with supervision.. Provided vc for directionality.  Neuromuscular Re-ed: NMR facilitated during session with focus on muscle activation and strengthening of neuromuscular connections. Pt guided in use of Kinetron set at 40cm/ sec for 30 sec, 35cm/ sec for 45 sec x3, then 35cm/ sec for 30sec. All with 1 - 1.98min rest between bouts. Pt performed well with good muscle activation and vc proved for cotinued full AROM throughout. NMR performed for improvements in motor control and coordination, balance, sequencing, judgement, and self confidence/ efficacy in performing all aspects of mobility at highest level of independence.   Patient seated upright  in w/c at end of session with brakes locked, belt alarm set, and all needs within reach with call bell on bed and other needs on tray table to pt's R side.      Therapy Documentation Precautions:  Precautions Precautions: Fall,  Back Precaution Booklet Issued: No Precaution Comments: reviewed back precautions Required Braces or Orthoses: Spinal Brace Spinal Brace: Thoracolumbosacral orthotic, Applied in sitting position Spinal Brace Comments: TLSO when OOB only Restrictions Weight Bearing Restrictions: No  Pain: Mild pain complaint with no numerical rating provided and increased tingling intensity in BLE.  Therapy/Group: Individual Therapy  Loel Dubonnet PT, DPT 04/07/2021, 10:17 AM

## 2021-04-07 NOTE — Progress Notes (Signed)
Occupational Therapy Session Note  Patient Details  Name: Marco Kennedy MRN: [id number] Date of Birth: [birth year]  Today's Date: 04/07/2021 OT Individual Time: 1856-3149 OT Individual Time Calculation (min): 54 min    Short Term Goals: Week 1:  OT Short Term Goal 1 (Week 1): Patient will don UB clothing seated EOB with Min A. OT Short Term Goal 2 (Week 1): Patient will complete squat-pivot transfers with Mod A +2. OT Short Term Goal 3 (Week 1): Patient will don LB clothing at bed level with Mod A of 1 helper and AD PRN.   Skilled Therapeutic Interventions/Progress Updates:    Pt greeted at time of session semireclined in bed resting agreeable to OT session, no pain resting but did have some back pain with mobility later in session, did not rate but improved with positional change. Donned TLSO bed level and placed maxipad with rolling Min A L/R prior to maximove transfer bed > wheelchair 2/2 no 2+ assist at this time. NT assisting for safety. Up in wheelchair, repositioned bringing hips back in chair with Max A, adjusted length of leg rests to provide additional support and for comfort. Self propel to sink and oral hygiene/shaving face with Min A 2/2 back pain with reaching for sink items. Self propel room <> unit for BUE strength and global endurance as well as to improve comfort in wheelchair. OT resumed propelling with fatigue. Back in room set up alarm on call bell in reach up in wheelchair, aware that pt can call if fatigued but going to try to wait up for next therapy session.   Therapy Documentation Precautions:  Precautions Precautions: Fall, Back Precaution Booklet Issued: No Precaution Comments: reviewed back precautions Required Braces or Orthoses: Spinal Brace Spinal Brace: Thoracolumbosacral orthotic, Applied in sitting position Spinal Brace Comments: TLSO when OOB only Restrictions Weight Bearing Restrictions: No     Therapy/Group: Individual Therapy  Erasmo Score 04/07/2021, 12:34 PM

## 2021-04-08 NOTE — Progress Notes (Signed)
Physical Therapy Session Note  Patient Details  Name: Marco Kennedy MRN: [id number] Date of Birth: [birth year]  Today's Date: 04/08/2021 PT Individual Time: 1100-1200 PT Individual Time Calculation (min): 60 min   Short Term Goals: Week 1:  PT Short Term Goal 1 (Week 1): Pt will tolerate sitting upright x 5 min with no more than 5/10 pain PT Short Term Goal 2 (Week 1): Pt will initiate w/c mobility PT Short Term Goal 3 (Week 1): Pt will perform bed mobility with mod A consistently adhering to back precautions  Skilled Therapeutic Interventions/Progress Updates:    Pt seated in w/c on arrival and agreeable to therapy. Pt reports manageable pain at rest, unrated, but higher levels with activity, premedicated and managed with positioning and rest breaks. Pt performed slideboard transfer w/c<>mat table with max A x2 for safety fading to min A of 1 with +2 for safety with instruction on technique and multimodal cueing. Pt performed seated reaches without back support x 3 bouts for improved core stability and sitting tolerance. Pt then performed 2 bouts of tossing and catching a ball while sitting upright to distract from back pain and increase core demand. Pt returned to room and remained in w/c. Manual therapy performed in sitting to L paraspinals and QL, with noted triggerpoint in QL. Pt was left with all needs in reach and alarm active.   Therapy Documentation Precautions:  Precautions Precautions: Fall, Back Precaution Booklet Issued: No Precaution Comments: reviewed back precautions Required Braces or Orthoses: Spinal Brace Spinal Brace: Thoracolumbosacral orthotic, Applied in sitting position Spinal Brace Comments: TLSO when OOB only Restrictions Weight Bearing Restrictions: No    Therapy/Group: Individual Therapy  [name] 04/08/2021, 6:27 PM

## 2021-04-08 NOTE — Progress Notes (Signed)
Occupational Therapy Session Note  Patient Details  Name: Marco Kennedy MRN: [id number] Date of Birth: [birth year]  Today's Date: 04/08/2021 OT Individual Time:  706- 956 and 1345-1455   71 mins (AM session ) and 70 min (PM session)   Short Term Goals: Week 1:  OT Short Term Goal 1 (Week 1): Patient will don UB clothing seated EOB with Min A. OT Short Term Goal 2 (Week 1): Patient will complete squat-pivot transfers with Mod A +2. OT Short Term Goal 3 (Week 1): Patient will don LB clothing at bed level with Mod A of 1 helper and AD PRN.   Skilled Therapeutic Interventions/Progress Updates: Session 1: Pt greeted at time of session supine in bed resting agreeable to OT session. Initial part of session on bed level bathing as pt stated too painful at this time. Bed level bathing assist for washing back and Max/total for washing LB past thigh level. UB dress Mod A bed level with rolling L/R to fully pull down in the back and Max/total for pants using reacher to help thread and roll to don over hips. Supine > sit EOB with 2 helpers, sitting EOB with significant 10/10 back pain and improved to 8/10 with donning TLSO and time to adjust to upright position. Slide board transfer total A for placement and Max of 2 > wheelchair. Note pt in significant pain throughout despite adherence to precautions and wearing TLSO. RN aware and provided med pass for pain meds during session. Self propel to sink and oral hygiene/face washing with Min A to reach water facet. Self propel > ortho gym and focused on 2 rounds of BITS dynamic sitting for 2:00 and 3:00 for reaching FWD within precautions and improving core strength. Back in room set up alarm on call bell in reach.    Session 2: Pt greeted at time of session up in wheelchair from previous sessions, agreeable to OT session despite fatigue. Self propel room > main gym with Supervision and extended time. Focused on BUE strengthening with 4# dowel and mirror for  feedback for 2x20 of the following: bicep curl, chest press, overhead press, FWD and backward circles all with cues for pace and form to improve UB strength for transfers and other self care tasks. SCIFIT for 10 minutes on level 4 for continued UB strengthening and global endurance training to improve endurance for self care routine. Transported back to room and slide board transfer w/c > bed with Max A of 2 with dependent for board placement. Pt with significant pain during transfer which improved sitting EOB and at rest, did not provide # for pain. Doffed TLSO sitting EOB and sit > supine 2 helpers. Scoot up in bed Min A with holding feet in place. Positioned for comfort, call bell in reach all needs met. Remainder of session focused on BLE ROM and there ex for hip/knee flexion, heel slides,ankle pumps, and hip abduction/adduction with gait belt for support 1x15 each. Alarm on call bell in reach.   Therapy Documentation Precautions:  Precautions Precautions: Fall, Back Precaution Booklet Issued: No Precaution Comments: reviewed back precautions Required Braces or Orthoses: Spinal Brace Spinal Brace: Thoracolumbosacral orthotic, Applied in sitting position Spinal Brace Comments: TLSO when OOB only Restrictions Weight Bearing Restrictions: No    Therapy/Group: Individual Therapy  Viona Gilmore 04/08/2021, 7:13 AM

## 2021-04-08 NOTE — Progress Notes (Signed)
PROGRESS NOTE   Subjective/Complaints:  Pain is somewhat better- sat up in bedside chair for 8 hours on and off yesterday.  Still using lift to get up.  In w/c for therapy.  Started Cymbalta last night- feels like might have more sensation/strength in legs-   Was scared cannot use voltaren- we discussed it.  BP 150s/90s.   ROS:   Pt denies SOB, abd pain, CP, N/V/C/D, and vision changes   Objective:   No results found. Recent Labs    04/06/21 0735  WBC 6.7  HGB 8.5*  HCT 27.3*  PLT 406*   Recent Labs    04/06/21 0735  NA 136  K 4.1  CL 98  CO2 30  GLUCOSE 156*  BUN 11  CREATININE 0.65  CALCIUM 8.6*    Intake/Output Summary (Last 24 hours) at 04/08/2021 1348 Last data filed at 04/08/2021 1259 Gross per 24 hour  Intake 780 ml  Output 200 ml  Net 580 ml        Physical Exam: Vital Signs Blood pressure 120/70, pulse 96, temperature 98.2 F (36.8 C), temperature source Oral, resp. rate 18, height 5\' 8"  (1.727 m), weight 119.9 kg, SpO2 99 %.     General: awake, alert, appropriate, sitting up in bed; NAD HENT: conjugate gaze; oropharynx moist CV: regular rate; no JVD Pulmonary: CTA B/L; no W/R/R- good air movement GI: soft, NT, ND, (+)BS Psychiatric: flat, depressed affect Neurological: Ox3   Skin: surgical site clean, dressed. Looks ok Neuro:  Alert and oriented x 3. Normal insight and awareness. Intact Memory. Normal language and speech. Cranial nerve exam unremarkable. UE 5/5, LE 3+ prox to 4/5 distally. No focal sensory findings. Lower ext Movement remains quite limited by pain Musculoskeletal: mid-low back pain. No  LE edema    Assessment/Plan: 1. Functional deficits which require 3+ hours per day of interdisciplinary therapy in a comprehensive inpatient rehab setting. Physiatrist is providing close team supervision and 24 hour management of active medical problems listed  below. Physiatrist and rehab team continue to assess barriers to discharge/monitor patient progress toward functional and medical goals  Care Tool:  Bathing    Body parts bathed by patient: Right arm, Left arm, Chest, Abdomen, Face   Body parts bathed by helper: Front perineal area, Buttocks, Left lower leg, Right lower leg     Bathing assist Assist Level: Moderate Assistance - Patient 50 - 74%     Upper Body Dressing/Undressing Upper body dressing   What is the patient wearing?: Pull over shirt    Upper body assist Assist Level: Moderate Assistance - Patient 50 - 74%    Lower Body Dressing/Undressing Lower body dressing      What is the patient wearing?: Pants, Incontinence brief     Lower body assist Assist for lower body dressing: Total Assistance - Patient < 25%     Toileting Toileting    Toileting assist Assist for toileting: Dependent - Patient 0% (bed pan, bed level) Assistive Device Comment: able to manage urinal with set-up assist; reports need for suppository for BM   Transfers Chair/bed transfer  Transfers assist     Chair/bed transfer assist level: Dependent -  mechanical lift     Locomotion Ambulation   Ambulation assist   Ambulation activity did not occur: Safety/medical concerns          Walk 10 feet activity   Assist  Walk 10 feet activity did not occur: Safety/medical concerns        Walk 50 feet activity   Assist Walk 50 feet with 2 turns activity did not occur: Safety/medical concerns         Walk 150 feet activity   Assist Walk 150 feet activity did not occur: Safety/medical concerns         Walk 10 feet on uneven surface  activity   Assist Walk 10 feet on uneven surfaces activity did not occur: Safety/medical concerns         Wheelchair     Assist Is the patient using a wheelchair?: Yes Type of Wheelchair: Manual    Wheelchair assist level: Dependent - Patient 0%      Wheelchair 50 feet  with 2 turns activity    Assist        Assist Level: Dependent - Patient 0%   Wheelchair 150 feet activity     Assist      Assist Level: Dependent - Patient 0%   Blood pressure 120/70, pulse 96, temperature 98.2 F (36.8 C), temperature source Oral, resp. rate 18, height 5\' 8"  (1.727 m), weight 119.9 kg, SpO2 99 %.  Medical Problem List and Plan: 1.  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 05/31/2021.  TLSO back brace when out of bed. JP drain removed 10/7.2022             -patient may shower             -ELOS/Goals: 18-21 days  Con't PT and OT- doing better with pain meds 2.  Impaired mobility: continue lovenox. Check vascular study             -antiplatelet therapy: N/A 3. Postoperative spinal pain: Voltaren 75 mg twice daily, Robaxin and oxycodone as needed. Added back dilaudid 1mg  q6H prn given severity of pain.   -10/9 adjust dilaudid to 2mg  q6 prn  -add tizanidine 2mg  tid for muscle spasms, continue robaxin q12 prn only  -encouraged pt to get oob as much as possible 10/10- changed robaxin to 750 mg q12 hours- prn- might change one of short acting pain meds to long acting? Will d/w pt.   10/11- will start ms contin 15 mg BID and stop Dliaudid and con't oxycodone- also add Cymbalta 30 mg QHS for nerve pain and mood.   10/12- pain better with MS Contin- somewhat- will con't regimen for now.  4. Mood: Provide emotional support  10/10- pt appears depressed- will d/w pt in AM about starting something that could also help pain.   10/11- adding cymbalta 30 mg QHS.              -antipsychotic agents: N/A 5. Neuropsych: This patient is capable of  making decisions on his own behalf. 6. Skin/Wound Care: Routine skin checks 7. Fluids/Electrolytes/Nutrition: Routine in and outs with follow-up chemistries 8.  ID.  Transitioned from intravenous cefazolin to amoxicillin.  Will discuss duration of antibiotic with infectious disease. 9.  Acute blood loss  anemia.  Latest hemoglobin 8.4.  Follow-up CBC Monday  10/10- Hb 8.5- con't to monitor- is stable 10.  Hyponatremia.  Sodium 131-134.  Follow-up chemistries 11.  Hypertension.  Continue Norvasc 5 mg daily.    -fair control so far despite pain  10/12- running a little higher- will monitor trend and if gets worse, will increase meds.  12.  Irritable bowel syndrome.  Amitiza 24 mcg twice daily 13.  History of gout.  Continue colchicine 14.  OSA.  CPAP 15.  Obesity.  Dietary follow-up      LOS: 5 days A FACE TO FACE EVALUATION WAS PERFORMED  Hend Mccarrell 04/08/2021, 1:48 PM

## 2021-04-08 NOTE — Patient Care Conference (Signed)
Inpatient RehabilitationTeam Conference and Plan of Care Update Date: 04/07/2021   Time: 11:49 AM    Patient Name: Marco Kennedy      Medical Record Number: [id number]  Date of Birth: 1950/08/31 Sex: Male         Room/Bed: 4W06C/4W06C-01 Payor Info: Payor: [employer]UE [employer] / Plan: BCBS COMM PPO / Product Type: *No Product type* /    Admit Date/Time:  04/03/2021  2:44 PM  Primary Diagnosis:  Epidural abscess  Hospital Problems: Principal Problem:   Epidural abscess    Expected Discharge Date: Expected Discharge Date:  (Will set next week)  Team Members Present: Physician leading conference: Dr. Genice Rouge Social Worker Present: Dossie Der, LCSW Nurse Present: Kennyth Arnold, RN PT Present: Bernie Covey, PT OT Present: Earleen Newport, OT PPS Coordinator present : Fae Pippin, SLP     Current Status/Progress Goal Weekly Team Focus  Bowel/Bladder   cont x2 LBM 04/06/2021  remain cont x2  toilet pt as needed   Swallow/Nutrition/ Hydration             ADL's   Max x2 squat pivot vs maximove, Max/total LB dress, Max UB dress, unable to sit in flexed position/pain severely limiting  Min A overall  bed mobility, OOB tolerance, functional transfers, AE training, ADL retraining, pain management   Mobility   rolling min A, max tot A x 2 for slideboard and squat pivot, extremely limited by pain  min A transfers, mod I w/c  LE strength, transfers   Communication             Safety/Cognition/ Behavioral Observations            Pain   pain is 4 of 10  less than 4  assess for pain prn   Skin   surgical incision on back  remain clean dry and intact  assess qshuft and provide skin care     Discharge Planning:  Been on rehab before and plan to return home with wife who can take some time off work and/or work from home. Aware he will require 24/7 care   Team Discussion: Added Cymbalta for mood and nerve pain. Continent B/B. Reports 5/10 pain. Not sleeping well.  Refuses CPAP. Incision covered with honeycomb dressing. Self-limiting in most sessions. Pushed himself around unit in W/C. Patient on target to meet rehab goals: yes, max assist +2 slide board, max/total assist lower body ADL's, max assist upper body ADL's, and to don brace. Max/total assist mobility, min assist transfers. Min assist goals overall. Mod I W/C goals.  *See Care Plan and progress notes for long and short-term goals.   Revisions to Treatment Plan:  Adjusting medications, encourage use of CPAP, encourage participation.  Teaching Needs: Family education, medication management, pain management, skin/wound care, don/doff TLSO brace, transfer training, W/C training, balance training, endurance training, safety awareness.  Current Barriers to Discharge: Decreased caregiver support, Medical stability, Home enviroment access/layout, Wound care, Lack of/limited family support, Weight, Weight bearing restrictions, Medication compliance, and Behavior  Possible Resolutions to Barriers: Family education with spouse, continue current medications, myofascial release, encourage to participate.     Medical Summary Current Status: myofascial pain; continent of B/L- LBM 10/11; not sleeping well; incision with honeycomb; refusing CPAP; depressed; pain and mood boggest limiters  Barriers to Discharge: Behavior;Decreased family/caregiver support;Home enviroment access/layout;Medical stability;Medication compliance;Weight;Weight bearing restrictions;Wound care;Other (comments)  Barriers to Discharge Comments: home with wife; depression and weak initiative to push himself; also pain is also  biggest limiter Possible Resolutions to [employer]: will change Dilaudid prn to MS contin 15 mg BID and added Cymbalta 30 mg daily for mood and pain; Tom for myofascial release; d/c- ~ 3- 3.5 weeks   Continued Need for Acute Rehabilitation Level of Care: The patient requires daily medical management by a  physician with specialized training in physical medicine and rehabilitation for the following reasons: Direction of a multidisciplinary physical rehabilitation program to maximize functional independence : Yes Medical management of patient stability for increased activity during participation in an intensive rehabilitation regime.: Yes Analysis of laboratory values and/or radiology reports with any subsequent need for medication adjustment and/or medical intervention. : Yes   I attest that I was present, lead the team conference, and concur with the assessment and plan of the team.   Tennis Must 04/08/2021, 9:16 AM

## 2021-04-09 MED ORDER — AMLODIPINE BESYLATE 10 MG PO TABS
10.0000 mg | ORAL_TABLET | Freq: Every day | ORAL | Status: DC
  Administered 2021-04-10 – 2021-05-08 (×29): 10 mg via ORAL
  Filled 2021-04-09 (×29): qty 1

## 2021-04-09 NOTE — Progress Notes (Signed)
Occupational Therapy Session Note  Patient Details  Name: Marco Kennedy MRN: [id number] Date of Birth: [birth year]  Today's Date: 04/09/2021 OT Individual Time: 8937-3428 OT Individual Time Calculation (min): 56 min    Short Term Goals: Week 1:  OT Short Term Goal 1 (Week 1): Patient will don UB clothing seated EOB with Min A. OT Short Term Goal 2 (Week 1): Patient will complete squat-pivot transfers with Mod A +2. OT Short Term Goal 3 (Week 1): Patient will don LB clothing at bed level with Mod A of 1 helper and AD PRN.   Skilled Therapeutic Interventions/Progress Updates:    Pt greeted at time of session semireclined, finishing up med pass. Pt with no pain resting but with significant back pain with bed mobility, RN just provided med pass. Change in position improved discomfort. Donned shorts bed level Max/total A with reacher to assist threading and rolling L/R with Min A for donning over hips. Supine > sit 2 helpers and in significant pain, which improved with rest. Sitting EOB with Min A, donned TLSO total A for the pt while sitting EOB. Total A for slide board placement, and Max A of 1 for transfer and 2nd helper providing assist for last scoot into wheelchair. Scooting hips back into place with Mod A. Self propel to sink and oral hygiene with set up. Transported to ortho gym and focused on sit > stand in [place] with Max A of 2 to extend hips. Sitting on perch, pt performing 1x20 of the following: punches at shoulder level and overhead to improve core strength in perched position. Stand >sit  back to wheelchair Max of 1. Transported back to room and set up alarm on call bell in reach.    Therapy Documentation Precautions:  Precautions Precautions: Fall, Back Precaution Booklet Issued: No Precaution Comments: reviewed back precautions Required Braces or Orthoses: Spinal Brace Spinal Brace: Thoracolumbosacral orthotic, Applied in sitting position Spinal Brace Comments: TLSO when OOB  only Restrictions Weight Bearing Restrictions: No     Therapy/Group: Individual Therapy  Erasmo Score 04/09/2021, 7:14 AM

## 2021-04-09 NOTE — Progress Notes (Signed)
Occupational Therapy Session Note  Patient Details  Name: Marco Kennedy MRN: [id number] Date of Birth: 02/22/1951  Today's Date: 04/10/2021 OT Individual Time: 9735-3299 and 2426-8341 OT Individual Time Calculation (min): 58 min and 42 min   Short Term Goals: Week 1:  OT Short Term Goal 1 (Week 1): Patient will don UB clothing seated EOB with Min A. OT Short Term Goal 2 (Week 1): Patient will complete squat-pivot transfers with Mod A +2. OT Short Term Goal 3 (Week 1): Patient will don LB clothing at bed level with Mod A of 1 helper and AD PRN.  Skilled Therapeutic Interventions/Progress Updates:    Pt greeted in bed, medicated for pain at start of session by RN. Pt reported 5/10 pain in back. He was agreeable to participate in tx. Max A of 1 for supine<sit. Pt in a great deal of pain during transition, he stated this was normal since CIR admission. Once EOB, TLSO was donned with Total A. Pt reported having too much pain to attempt using reacher to thread LEs into shorts, noted pt needing unilateral or bilateral support on the mattress to maintain sitting balance. +2 for sit<stand in bariatric stedy for transfer to the w/c and for elevating pants over hips. While sitting at the sink, pt engaged in oral care/grooming tasks and UB self care with Min-setup assistance. Positioned a pillow behind him for increased comfort. Note that he had a tough time with anterior weight shifting during functional tasks due to pain. Worked on functional LE mobility while OT assisted him with washing lower legs and then donning footwear. He remained sitting up at close of session, wearing TLSO, all needs within reach. Tx focus placed on ADL retraining, balance, and activity tolerance.   2nd Session 1:1 tx (42 min) Pt greeted in the w/c and premedicated for pain. Issued pt bilateral ELRs to address edema. Also showed him how to use the gait belt for hamstring stretching, ankle pumps, and building knee control in prep  for functional transfers at the sit<stand level. Blocked practice with using the reacher to thread LEs into theraband for simulated LB dressing training. He remained sitting up at close of session, all needs within reach.   Therapy Documentation Precautions:  Precautions Precautions: Fall, Back Precaution Booklet Issued: No Precaution Comments: reviewed back precautions Required Braces or Orthoses: Spinal Brace Spinal Brace: Thoracolumbosacral orthotic, Applied in sitting position Spinal Brace Comments: TLSO when OOB only Restrictions Weight Bearing Restrictions: No ADL: ADL Eating: Set up Grooming: Setup Where Assessed-Grooming: Edge of bed (sitting EOB with Min to Mod A to maintain static sitting balance) Upper Body Bathing: Moderate assistance Where Assessed-Upper Body Bathing: Bed level Lower Body Bathing: Dependent Where Assessed-Lower Body Bathing: Bed level Upper Body Dressing: Maximal assistance Where Assessed-Upper Body Dressing: Edge of bed Lower Body Dressing: Dependent Where Assessed-Lower Body Dressing: Bed level Toileting: Setup Where Assessed-Toileting: Other (Comment) (Urinal) Toilet Transfer Method: Unable to assess Tub/Shower Transfer: Unable to assess    Therapy/Group: Individual Therapy  [name] A [name] 04/10/2021, 12:31 PM

## 2021-04-09 NOTE — Progress Notes (Addendum)
PROGRESS NOTE   Subjective/Complaints:   Pt reports pain is still really bad, but actually did SB transfer yesterday- needs bedpan right now.  Slept OK- we discussed changing Oxycodone to higher dose or not- we agreed to wait 1 more day and see how today goes.  Ate 100% breakfast.   Waiting for Tom to do myofascial release.   ROS:   Pt denies SOB, abd pain, CP, N/V/C/D, and vision changes   Objective:   No results found. No results for input(s): WBC, HGB, HCT, PLT in the last 72 hours.  No results for input(s): NA, K, CL, CO2, GLUCOSE, BUN, CREATININE, CALCIUM in the last 72 hours.   Intake/Output Summary (Last 24 hours) at 04/09/2021 0859 Last data filed at 04/09/2021 0700 Gross per 24 hour  Intake 720 ml  Output 750 ml  Net -30 ml        Physical Exam: Vital Signs Blood pressure (!) 152/85, pulse 80, temperature 97.6 F (36.4 C), resp. rate 18, height 5\' 8"  (1.727 m), weight 119.9 kg, SpO2 97 %.      General: awake, alert, appropriate, less depressed; sitting up in bed; NAD HENT: conjugate gaze; oropharynx moist CV: regular rate; no JVD Pulmonary: CTA B/L; no W/R/R- good air movement GI: soft, NT, ND, (+)BS; hyperactive- needs bedpan Psychiatric: appropriate but slightly depressed- a little less Neurological: alert Skin: surgical site clean, dressed. Looks ok Neuro:  Alert and oriented x 3. Normal insight and awareness. Intact Memory. Normal language and speech. Cranial nerve exam unremarkable. UE 5/5, LE 3+ prox to 4/5 distally. No focal sensory findings. Lower ext Movement remains quite limited by pain Musculoskeletal: mid-low back pain. No  LE edema    Assessment/Plan: 1. Functional deficits which require 3+ hours per day of interdisciplinary therapy in a comprehensive inpatient rehab setting. Physiatrist is providing close team supervision and 24 hour management of active medical problems listed  below. Physiatrist and rehab team continue to assess barriers to discharge/monitor patient progress toward functional and medical goals  Care Tool:  Bathing    Body parts bathed by patient: Right arm, Left arm, Chest, Abdomen, Face   Body parts bathed by helper: Front perineal area, Buttocks, Left lower leg, Right lower leg     Bathing assist Assist Level: Moderate Assistance - Patient 50 - 74%     Upper Body Dressing/Undressing Upper body dressing   What is the patient wearing?: Pull over shirt    Upper body assist Assist Level: Moderate Assistance - Patient 50 - 74%    Lower Body Dressing/Undressing Lower body dressing      What is the patient wearing?: Pants, Incontinence brief     Lower body assist Assist for lower body dressing: Total Assistance - Patient < 25%     Toileting Toileting    Toileting assist Assist for toileting: Dependent - Patient 0% (bed pan, bed level) Assistive Device Comment: able to manage urinal with set-up assist; reports need for suppository for BM   Transfers Chair/bed transfer  Transfers assist     Chair/bed transfer assist level: Dependent - mechanical lift     Locomotion Ambulation   Ambulation assist   Ambulation  activity did not occur: Safety/medical concerns          Walk 10 feet activity   Assist  Walk 10 feet activity did not occur: Safety/medical concerns        Walk 50 feet activity   Assist Walk 50 feet with 2 turns activity did not occur: Safety/medical concerns         Walk 150 feet activity   Assist Walk 150 feet activity did not occur: Safety/medical concerns         Walk 10 feet on uneven surface  activity   Assist Walk 10 feet on uneven surfaces activity did not occur: Safety/medical concerns         Wheelchair     Assist Is the patient using a wheelchair?: Yes Type of Wheelchair: Manual    Wheelchair assist level: Dependent - Patient 0%      Wheelchair 50 feet  with 2 turns activity    Assist        Assist Level: Dependent - Patient 0%   Wheelchair 150 feet activity     Assist      Assist Level: Dependent - Patient 0%   Blood pressure (!) 152/85, pulse 80, temperature 97.6 F (36.4 C), resp. rate 18, height 5\' 8"  (1.727 m), weight 119.9 kg, SpO2 97 %.  Medical Problem List and Plan: 1.  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 05/31/2021.  TLSO back brace when out of bed. JP drain removed 10/7.2022             -patient may shower             -ELOS/Goals: 18-21 days  Con't PT and OT- CIR- did SB transfer which is great! 2.  Impaired mobility: continue lovenox. Check vascular study             -antiplatelet therapy: N/A 3. Postoperative spinal pain: Voltaren 75 mg twice daily, Robaxin and oxycodone as needed. Added back dilaudid 1mg  q6H prn given severity of pain.   -10/9 adjust dilaudid to 2mg  q6 prn  -add tizanidine 2mg  tid for muscle spasms, continue robaxin q12 prn only  -encouraged pt to get oob as much as possible  10/11- will start ms contin 15 mg BID and stop Dliaudid and con't oxycodone- also add Cymbalta 30 mg QHS for nerve pain and mood.   10/12- pain better with MS Contin- somewhat- will con't regimen for now. 10/13- pain is still bad, but able to do more/function better- will wait to increase Oxycodone prn.   4. Mood: Provide emotional support  10/10- pt appears depressed- will d/w pt in AM about starting something that could also help pain.   10/11- adding cymbalta 30 mg QHS.              -antipsychotic agents: N/A 5. Neuropsych: This patient is capable of making decisions on his own  behalf. 6. Skin/Wound Care: Routine skin checks 7. Fluids/Electrolytes/Nutrition: Routine in and outs with follow-up chemistries 8.  ID.  Transitioned from intravenous cefazolin to amoxicillin.  Will discuss duration of antibiotic with infectious disease.  10/13- will check with ID when to stop Aomoxicllin- around 10/20- call Dr 12/12 or ID 9.  Acute blood loss anemia.  Latest hemoglobin  8.4.  Follow-up CBC Monday  10/10- Hb 8.5- con't to monitor- is stable 10.  Hyponatremia.  Sodium 131-134.  Follow-up chemistries 11.  Hypertension.  Continue Norvasc 5 mg daily.    -fair control so far despite pain  10/12- running a little higher- will monitor trend and if gets worse, will increase meds.   10/13- still 150s/80s- will increase Norvasc to 10 mg daily 12.  Irritable bowel syndrome.  Amitiza 24 mcg twice daily 13.  History of gout.  Continue colchicine 14.  OSA.  CPAP 15.  Obesity.  Dietary follow-up      LOS: 6 days A FACE TO FACE EVALUATION WAS PERFORMED  Yevette Knust 04/09/2021, 8:59 AM

## 2021-04-09 NOTE — Progress Notes (Signed)
Physical Therapy Session Note  Patient Details  Name: Marco Kennedy MRN: [id number] Date of Birth: 01/22/1951  Today's Date: 04/09/2021 PT Individual Time: 1015-1111,1300-1355 PT Individual Time Calculation (min): 56 min, 55 min   Short Term Goals: Week 1:  PT Short Term Goal 1 (Week 1): Pt will tolerate sitting upright x 5 min with no more than 5/10 pain PT Short Term Goal 2 (Week 1): Pt will initiate w/c mobility PT Short Term Goal 3 (Week 1): Pt will perform bed mobility with mod A consistently adhering to back precautions  Skilled Therapeutic Interventions/Progress Updates:    Session 1: Pt seated in w/c on arrival and agreeable to therapy. Pt reports 4/10 pain, premedicated, rest breaks as needed. Pt propelled w/c with BUE to day room for endurance and functional mobility. Pt set up on kinetron for LE endurance and posterior chain strengthening, 3 x 2 min with 30 sec rest break at 70 cm/s. Transitioned to standing in stedy with max of 2 to stand initially. Sit to stand from stedy perch 2 x 5, fading to min A to facilitate hip extension, seated rest breaks in chair between bouts. Pt then performed ball toss from stedy perch for improved core activation, LE weight bearing, and endurance, 3 x 20. Pt propelled w/c back to room and remained there after session, was left with all needs in reach and alarm active.    Session 2: Pt seated in w/c on arrival and agreeable to therapy. Pt reports 4/10 pain with rest, up to 8/10 with activity, rest breaks and positioning as needed. Pt propelled w/c with BUE x 300 ft for endurance and functional mobility. Slideboard transfer w/c<>mat table with mod A fading to min A with +2 for safety. Pt directed in Sit to stand from elevated mat table to RW with mod-max A x2 with BIL knee block. 5 reps with extended rest breaks. One incident of L knee buckling after standing for ~30 sec, pt sat to table with no adverse reaction. Pt returned to room and performed Sit to  stand in the same manner to place maxi move sling in chair to assist with nursing transfers later this PM. Pt remained in w/c after session and was left with all needs in reach and alarm active.   Therapy Documentation Precautions:  Precautions Precautions: Fall, Back Precaution Booklet Issued: No Precaution Comments: reviewed back precautions Required Braces or Orthoses: Spinal Brace Spinal Brace: Thoracolumbosacral orthotic, Applied in sitting position Spinal Brace Comments: TLSO when OOB only Restrictions Weight Bearing Restrictions: No   Therapy/Group: Individual Therapy  [name] 04/09/2021, 1:01 PM

## 2021-04-09 NOTE — Progress Notes (Signed)
[name], male   DOB: 14-Feb-1951, 70 y.o.   MRN: [id number]  Met with pt to inform him at team conference unable to set target discharge date will this coming Tuesday. Pt is aware of this and reports still having much pain and MD is adjusting medications for him. He is trying in therapies and is wanting to push himself to make progress so he can get back to supervision level where he was prior to admission for other surgery. Wife aware of this plan also. Continue to work on discharge needs.

## 2021-04-10 NOTE — Progress Notes (Signed)
Physical Therapy Session Note  Patient Details  Name: Marco Kennedy MRN: [id number] Date of Birth: 1951-05-17  Today's Date: 04/10/2021 PT Individual Time: 1000-1100, 1445-1328 PT Individual Time Calculation (min): 60 min, 43 min   Short Term Goals: Week 1:  PT Short Term Goal 1 (Week 1): Pt will tolerate sitting upright x 5 min with no more than 5/10 pain PT Short Term Goal 2 (Week 1): Pt will initiate w/c mobility PT Short Term Goal 3 (Week 1): Pt will perform bed mobility with mod A consistently adhering to back precautions  Skilled Therapeutic Interventions/Progress Updates:    Session 1: Pt seated in w/c on arrival and agreeable to therapy. Pt reports 5/10 pain at rest, premedicated. Pt propelled w/c with BUE for functional mobility and endurance. Slideboard transfer <> mat table with mod A and +2 for safety. Pt demoing difficulty with transfer d/t incr pain with anterior weight shift. Sit<>sidelying on mat table with tot A x 2 for pain management.  Therapist performed manual therapy to L lumbar area, noted trigger points in paraspinals and QL on L side. Pt reported some relief with MT, but had increased pain when returning to sitting. Pt returned to room and remained sitting up in w/c, was left with all needs in reach and alarm active.   Session 2: Pt seated in w/c on arrival and agreeable to therapy. Pt reports baseline levels of pain, unrated and premedicated. Pt propelled w/c with BUE to day room for functional mobility and endurance. Slideboard transfer <> mat table with min A x2 fading to CGA x2. Session focused on standing, with mod A to power up and BIL knee block. Pt directed in standing marches, initially with difficulty clearing LLE, but progressing to small steps forward and back, in order to progress to stand pivot transfers in future sessions. Pt returned to room and remained in w/c with all needs in reach and alarm active.   Therapy Documentation Precautions:   Precautions Precautions: Fall, Back Precaution Booklet Issued: No Precaution Comments: reviewed back precautions Required Braces or Orthoses: Spinal Brace Spinal Brace: Thoracolumbosacral orthotic, Applied in sitting position Spinal Brace Comments: TLSO when OOB only Restrictions Weight Bearing Restrictions: No    Therapy/Group: Individual Therapy  [name] 04/10/2021, 4:01 PM

## 2021-04-10 NOTE — Progress Notes (Signed)
PROGRESS NOTE   Subjective/Complaints:   Marco Kennedy has been off so cannot do myofascial release til next week likely. D/w therapy.  Doing SB transfer still and stood in steady- Stayed in chair til 6pm last night.  Mild nausea this AM- admits takes pain meds with no food on stomach.   Ate 75%+ of breakfast.   ROS:   Pt denies SOB, abd pain, CP, N/V/C/D, and vision changes   Objective:   No results found. No results for input(s): WBC, HGB, HCT, PLT in the last 72 hours.  No results for input(s): NA, K, CL, CO2, GLUCOSE, BUN, CREATININE, CALCIUM in the last 72 hours.   Intake/Output Summary (Last 24 hours) at 04/10/2021 1846 Last data filed at 04/10/2021 1300 Gross per 24 hour  Intake 720 ml  Output 500 ml  Net 220 ml        Physical Exam: Vital Signs Blood pressure 113/64, pulse (!) 58, temperature 98.3 F (36.8 C), temperature source Oral, resp. rate 18, height 5\' 8"  (1.727 m), weight 119.9 kg, SpO2 99 %.       General: awake, alert, appropriate, sitting up in bed; calling nurse for bedpan/meds; NAD HENT: conjugate gaze; oropharynx moist CV: regular rate; no JVD Pulmonary: CTA B/L; no W/R/R- good air movement GI: soft, NT, ND, (+)BS; protuberant; hypoactive Psychiatric: appropriate; but flat- discussed that pain won't just disappear- but will get better Neurological: Ox3  Skin: surgical site clean, dressed. Looks ok Neuro:  Alert and oriented x 3. Normal insight and awareness. Intact Memory. Normal language and speech. Cranial nerve exam unremarkable. UE 5/5, LE 3+ prox to 4/5 distally. No focal sensory findings. Lower ext Movement remains quite limited by pain Musculoskeletal: mid-low back pain. No  LE edema    Assessment/Plan: 1. Functional deficits which require 3+ hours per day of interdisciplinary therapy in a comprehensive inpatient rehab setting. Physiatrist is providing close team supervision and 24  hour management of active medical problems listed below. Physiatrist and rehab team continue to assess barriers to discharge/monitor patient progress toward functional and medical goals  Care Tool:  Bathing    Body parts bathed by patient: Right arm, Left arm, Chest, Abdomen, Face   Body parts bathed by helper: Front perineal area, Buttocks, Left lower leg, Right lower leg     Bathing assist Assist Level: Moderate Assistance - Patient 50 - 74%     Upper Body Dressing/Undressing Upper body dressing   What is the patient wearing?: Pull over shirt    Upper body assist Assist Level: Moderate Assistance - Patient 50 - 74%    Lower Body Dressing/Undressing Lower body dressing      What is the patient wearing?: Pants     Lower body assist Assist for lower body dressing: Total Assistance - Patient < 25%     Toileting Toileting    Toileting assist Assist for toileting: Dependent - Patient 0% (bed pan, bed level) Assistive Device Comment: able to manage urinal with set-up assist; reports need for suppository for BM   Transfers Chair/bed transfer  Transfers assist     Chair/bed transfer assist level: Dependent - mechanical lift     Locomotion Ambulation  Ambulation assist   Ambulation activity did not occur: Safety/medical concerns          Walk 10 feet activity   Assist  Walk 10 feet activity did not occur: Safety/medical concerns        Walk 50 feet activity   Assist Walk 50 feet with 2 turns activity did not occur: Safety/medical concerns         Walk 150 feet activity   Assist Walk 150 feet activity did not occur: Safety/medical concerns         Walk 10 feet on uneven surface  activity   Assist Walk 10 feet on uneven surfaces activity did not occur: Safety/medical concerns         Wheelchair     Assist Is the patient using a wheelchair?: Yes Type of Wheelchair: Manual    Wheelchair assist level: Dependent - Patient 0%       Wheelchair 50 feet with 2 turns activity    Assist        Assist Level: Dependent - Patient 0%   Wheelchair 150 feet activity     Assist      Assist Level: Dependent - Patient 0%   Blood pressure 113/64, pulse (!) 58, temperature 98.3 F (36.8 C), temperature source Oral, resp. rate 18, height 5\' 8"  (1.727 m), weight 119.9 kg, SpO2 99 %.  Medical Problem List and Plan: 1.  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 05/31/2021.  TLSO back brace when out of bed. JP drain removed 10/7.2022             -patient may shower             -ELOS/Goals: 18-21 days  Con't PT and OT-CIR- goal min A by d/c- d/w wife; also explained pain won't just disappear- will take time to improve.  2.  Impaired mobility: continue lovenox. Check vascular study             -antiplatelet therapy: N/A 3. Postoperative spinal pain: Voltaren 75 mg twice daily, Robaxin and oxycodone as needed. Added back dilaudid 1mg  q6H prn given severity of pain.   -10/9 adjust dilaudid to 2mg  q6 prn  -add tizanidine 2mg  tid for muscle spasms, continue robaxin q12 prn only  -encouraged pt to get oob as much as possible  10/11- will start ms contin 15 mg BID and stop Dliaudid and con't oxycodone- also add Cymbalta 30 mg QHS for nerve pain and mood.  10/14- wait on changing Oxycodone dose to 10-15 mg- for now- if pt progressing in therapy, won't make changes.    4. Mood: Provide emotional support  10/10- pt appears depressed- will d/w pt in AM about starting something that could also help pain.   10/11- adding cymbalta 30 mg QHS. 10/14- will increase Monday              -antipsychotic agents:  N/A 5. Neuropsych: This patient is capable of making decisions on his own behalf. 6. Skin/Wound Care: Routine skin checks 7. Fluids/Electrolytes/Nutrition: Routine in and outs with follow-up chemistries 8.  ID.  Transitioned from intravenous cefazolin to amoxicillin.  Will discuss duration of antibiotic with infectious disease.  10/13- will check with ID when to stop Aomoxicllin- around 10/20-  per chart-  call Dr Dorna Bloom or ID 9.  Acute blood loss anemia.  Latest hemoglobin 8.4.  Follow-up CBC Monday  10/10- Hb 8.5- con't to monitor- is stable 10.  Hyponatremia.  Sodium 131-134.  Follow-up chemistries 11.  Hypertension.  Continue Norvasc 5 mg daily.    -fair control so far despite pain  10/12- running a little higher- will monitor trend and if gets worse, will increase meds.   10/13- still 150s/80s- will increase Norvasc to 10 mg daily  10/14- BP 111 systolic- con't regimen 12.  Irritable bowel syndrome.  Amitiza 24 mcg twice daily 13.  History of gout.  Continue colchicine 14.  OSA.  CPAP 15.  Obesity.  Dietary follow-up      LOS: 7 days A FACE TO FACE EVALUATION WAS PERFORMED  Marco Kennedy 04/10/2021, 6:46 PM

## 2021-04-11 DIAGNOSIS — E46 Unspecified protein-calorie malnutrition: Secondary | ICD-10-CM

## 2021-04-11 DIAGNOSIS — D62 Acute posthemorrhagic anemia: Secondary | ICD-10-CM

## 2021-04-11 DIAGNOSIS — G8918 Other acute postprocedural pain: Secondary | ICD-10-CM

## 2021-04-11 DIAGNOSIS — R0989 Other specified symptoms and signs involving the circulatory and respiratory systems: Secondary | ICD-10-CM

## 2021-04-11 DIAGNOSIS — I10 Essential (primary) hypertension: Secondary | ICD-10-CM

## 2021-04-11 DIAGNOSIS — E8809 Other disorders of plasma-protein metabolism, not elsewhere classified: Secondary | ICD-10-CM

## 2021-04-11 MED ORDER — PROSOURCE PLUS PO LIQD
30.0000 mL | Freq: Two times a day (BID) | ORAL | Status: DC
  Administered 2021-04-11 – 2021-05-07 (×49): 30 mL via ORAL
  Filled 2021-04-11 (×51): qty 30

## 2021-04-11 NOTE — Progress Notes (Signed)
PROGRESS NOTE   Subjective/Complaints: Patient seen laying in bed this morning.  He states he slept well overnight.  He denies complaints, has some questions regarding therapy schedule today.  Discussed with therapies.  Notified by nursing regarding stage I sacral ulcer  ROS: Denies CP, SOB, N/V/D  Objective:   No results found. No results for input(s): WBC, HGB, HCT, PLT in the last 72 hours.  No results for input(s): NA, K, CL, CO2, GLUCOSE, BUN, CREATININE, CALCIUM in the last 72 hours.   Intake/Output Summary (Last 24 hours) at 04/11/2021 0908 Last data filed at 04/10/2021 2206 Gross per 24 hour  Intake 720 ml  Output 200 ml  Net 520 ml      Pressure Injury 04/10/21 Buttocks Left;Posterior;Proximal Stage 1 -  Intact skin with non-blanchable redness of a localized area usually over a bony prominence. (Active)  04/10/21   Location: Buttocks  Location Orientation: Left;Posterior;Proximal  Staging: Stage 1 -  Intact skin with non-blanchable redness of a localized area usually over a bony prominence.  Wound Description (Comments):   Present on Admission:     Physical Exam: Vital Signs Blood pressure (!) 155/77, pulse 80, temperature 98.2 F (36.8 C), resp. rate 19, height 5\' 8"  (1.727 m), weight 119.9 kg, SpO2 96 %. Constitutional: No distress . Vital signs reviewed. HENT: Normocephalic.  Atraumatic. Eyes: EOMI. No discharge. Cardiovascular: No JVD.  RRR. Respiratory: Normal effort.  No stridor.  Bilateral clear to auscultation. GI: Non-distended.  BS +. Skin: Warm and dry.  Stage I sacral ulcer left buttock. Psych: Normal mood.  Normal behavior. Musc: No edema in extremities.  No tenderness in extremities. Neuro: Alert and oriented Motor: Bilateral upper extremities: 5/5 proximal distal Bilateral lower extremities: 4/5 proximal distal   Assessment/Plan: 1. Functional deficits which require 3+ hours per day of  interdisciplinary therapy in a comprehensive inpatient rehab setting. Physiatrist is providing close team supervision and 24 hour management of active medical problems listed below. Physiatrist and rehab team continue to assess barriers to discharge/monitor patient progress toward functional and medical goals  Care Tool:  Bathing    Body parts bathed by patient: Right arm, Left arm, Chest, Abdomen, Face   Body parts bathed by helper: Front perineal area, Buttocks, Left lower leg, Right lower leg     Bathing assist Assist Level: Moderate Assistance - Patient 50 - 74%     Upper Body Dressing/Undressing Upper body dressing   What is the patient wearing?: Pull over shirt    Upper body assist Assist Level: Moderate Assistance - Patient 50 - 74%    Lower Body Dressing/Undressing Lower body dressing      What is the patient wearing?: Pants     Lower body assist Assist for lower body dressing: Total Assistance - Patient < 25%     Toileting Toileting    Toileting assist Assist for toileting: Dependent - Patient 0% (bed pan, bed level) Assistive Device Comment: able to manage urinal with set-up assist; reports need for suppository for BM   Transfers Chair/bed transfer  Transfers assist     Chair/bed transfer assist level: Dependent - mechanical lift     Locomotion Ambulation  Ambulation assist   Ambulation activity did not occur: Safety/medical concerns          Walk 10 feet activity   Assist  Walk 10 feet activity did not occur: Safety/medical concerns        Walk 50 feet activity   Assist Walk 50 feet with 2 turns activity did not occur: Safety/medical concerns         Walk 150 feet activity   Assist Walk 150 feet activity did not occur: Safety/medical concerns         Walk 10 feet on uneven surface  activity   Assist Walk 10 feet on uneven surfaces activity did not occur: Safety/medical concerns          Wheelchair     Assist Is the patient using a wheelchair?: Yes Type of Wheelchair: Manual    Wheelchair assist level: Dependent - Patient 0%      Wheelchair 50 feet with 2 turns activity    Assist        Assist Level: Dependent - Patient 0%   Wheelchair 150 feet activity     Assist      Assist Level: Dependent - Patient 0%   Blood pressure (!) 155/77, pulse 80, temperature 98.2 F (36.8 C), resp. rate 19, height 5\' 8"  (1.727 m), weight 119.9 kg, SpO2 96 %.  Medical Problem List and Plan: 1.  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 05/31/2021.  TLSO back brace when out of bed. JP drain removed 10/7.2022  Continue CIR 2.  Impaired mobility: continue lovenox. Check vascular study             -antiplatelet therapy: N/A 3. Postoperative spinal pain: Voltaren 75 mg twice daily, Robaxin and oxycodone as needed. Added back dilaudid 1mg  q6H prn given severity of pain.   -10/9 adjust dilaudid to 2mg  q6 prn  -add tizanidine 2mg  tid for muscle spasms, continue robaxin q12 prn only  -encouraged pt to get oob as much as possible  10/11- will start ms contin 15 mg BID and stop Dliaudid and con't oxycodone- also add Cymbalta 30 mg QHS for nerve pain and mood.  10/14- wait on changing Oxycodone dose to 10-15 mg- for now- if pt progressing in therapy, won't make changes.    Appears to be controlled with meds on 10/15 4. Mood: Provide emotional support  10/10- pt appears depressed- will d/w pt in AM about starting something that could also help pain.   10/11- adding cymbalta 30 mg QHS. 10/14-plan to increase Monday              -antipsychotic agents:  N/A 5. Neuropsych: This patient is capable of making decisions on his own behalf. 6. Skin/Wound Care: Routine skin checks 7. Fluids/Electrolytes/Nutrition: Routine in and outs 8.  ID.  Transitioned from intravenous cefazolin to amoxicillin.    10/13- will check with ID when to stop Aomoxicllin- around 10/20- per chart-  call Dr 11/15 or ID 9.  Acute blood loss anemia.  Latest hemoglobin 8.4.  Follow-up CBC Monday  Hemoglobin 8.5 on 10/10, labs ordered for Monday 10.  Hyponatremia.  Sodium 136 on 10/10 labs ordered for Monday 11.  Hypertension.  Continue Norvasc 5 mg daily.    -fair  control so far despite pain 10/13- increased Norvasc to 10 mg daily  Slightly labile on 10/15 12.  Irritable bowel syndrome.  Amitiza 24 mcg twice daily 13.  History of gout.  Continue colchicine 14.  OSA.  CPAP 15.  Obesity.  Dietary follow-up 16.  Hypoalbuminemia  Supplement initiated on 10/15    LOS: 8 days A FACE TO FACE EVALUATION WAS PERFORMED  Avey Mcmanamon Karis Juba 04/11/2021, 9:08 AM

## 2021-04-12 ENCOUNTER — Other Ambulatory Visit: Payer: Self-pay

## 2021-04-12 NOTE — Progress Notes (Signed)
Occupational Therapy Session Note  Patient Details  Name: Marco Kennedy MRN: [id number] Date of Birth: 07/02/1950  Today's Date: 04/13/2021 OT Individual Time: 2751-7001 OT Individual Time Calculation (min): 58 min   Short Term Goals: Week 1:  OT Short Term Goal 1 (Week 1): Patient will don UB clothing seated EOB with Min A. OT Short Term Goal 2 (Week 1): Patient will complete squat-pivot transfers with Mod A +2. OT Short Term Goal 3 (Week 1): Patient will don LB clothing at bed level with Mod A of 1 helper and AD PRN.  Skilled Therapeutic Interventions/Progress Updates:    Pt greeted in the w/c and was premedicated for pain, wearing TLSO. Agreeable to session. Started with toilet transfer training, pt completed sit<stand in bariatric [place] with Max A x2. He transferred to the [facility] for simulated practice. Mod A x2 to rise into standing from elevated BSC. Worked on standing endurance in [place] for functional carryover during toileting tasks, pt able to stand for 33 sec, 40, and 22 sec intervals respectively, completing sit<stand from elevated paddles given Mod A of 1. He then returned to the w/c and completed towel slide exercises for B LEs with Mod A for controlled direction during knee extension. More assistance required on Rt vs Lt side. Finished session with UB stretches to minimize risk of UB strain injury as pt has to rely on UB strength for functional movement to compensate for LB weakness at this time. He remained in the w/c at close of session, all needs within reach.   Notified RN of change of pts toilet transfer status on safety plan  Therapy Documentation Precautions:  Precautions Precautions: Fall, Back Precaution Booklet Issued: No Precaution Comments: reviewed back precautions Required Braces or Orthoses: Spinal Brace Spinal Brace: Thoracolumbosacral orthotic, Applied in sitting position Spinal Brace Comments: TLSO when OOB only Restrictions Weight Bearing Restrictions:  No Pain: Pain Assessment Pain Score: 3  ADL: ADL Eating: Set up Grooming: Setup Where Assessed-Grooming: Edge of bed (sitting EOB with Min to Mod A to maintain static sitting balance) Upper Body Bathing: Moderate assistance Where Assessed-Upper Body Bathing: Bed level Lower Body Bathing: Dependent Where Assessed-Lower Body Bathing: Bed level Upper Body Dressing: Maximal assistance Where Assessed-Upper Body Dressing: Edge of bed Lower Body Dressing: Dependent Where Assessed-Lower Body Dressing: Bed level Toileting: Setup Where Assessed-Toileting: Other (Comment) (Urinal) Toilet Transfer Method: Unable to assess Tub/Shower Transfer: Unable to assess  Therapy/Group: Individual Therapy  Elowen Debruyn A Makalyn Lennox 04/13/2021, 4:11 PM

## 2021-04-13 LAB — CBC WITH DIFFERENTIAL/PLATELET
Abs Immature Granulocytes: 0.04 10*3/uL (ref 0.00–0.07)
Basophils Absolute: 0 10*3/uL (ref 0.0–0.1)
Basophils Relative: 1 %
Eosinophils Absolute: 0.1 10*3/uL (ref 0.0–0.5)
Eosinophils Relative: 2 %
HCT: 29.7 % — ABNORMAL LOW (ref 39.0–52.0)
Hemoglobin: 9.3 g/dL — ABNORMAL LOW (ref 13.0–17.0)
Immature Granulocytes: 1 %
Lymphocytes Relative: 5 %
Lymphs Abs: 0.4 10*3/uL — ABNORMAL LOW (ref 0.7–4.0)
MCH: 27.6 pg (ref 26.0–34.0)
MCHC: 31.3 g/dL (ref 30.0–36.0)
MCV: 88.1 fL (ref 80.0–100.0)
Monocytes Absolute: 0.5 10*3/uL (ref 0.1–1.0)
Monocytes Relative: 6 %
Neutro Abs: 7.1 10*3/uL (ref 1.7–7.7)
Neutrophils Relative %: 85 %
Platelets: 360 10*3/uL (ref 150–400)
RBC: 3.37 MIL/uL — ABNORMAL LOW (ref 4.22–5.81)
RDW: 15 % (ref 11.5–15.5)
WBC: 8.3 10*3/uL (ref 4.0–10.5)
nRBC: 0 % (ref 0.0–0.2)

## 2021-04-13 LAB — BASIC METABOLIC PANEL
Anion gap: 7 (ref 5–15)
BUN: 14 mg/dL (ref 8–23)
CO2: 30 mmol/L (ref 22–32)
Calcium: 8.5 mg/dL — ABNORMAL LOW (ref 8.9–10.3)
Chloride: 99 mmol/L (ref 98–111)
Creatinine, Ser: 0.72 mg/dL (ref 0.61–1.24)
GFR, Estimated: >60 mL/min (ref >60)
Glucose, Bld: 146 mg/dL — ABNORMAL HIGH (ref 70–99)
Potassium: 3.7 mmol/L (ref 3.5–5.1)
Sodium: 136 mmol/L (ref 135–145)

## 2021-04-13 MED ORDER — DULOXETINE HCL 60 MG PO CPEP
60.0000 mg | ORAL_CAPSULE | Freq: Every day | ORAL | Status: DC
  Administered 2021-04-13 – 2021-05-07 (×25): 60 mg via ORAL
  Filled 2021-04-13 (×25): qty 1

## 2021-04-13 NOTE — Progress Notes (Signed)
PROGRESS NOTE   Subjective/Complaints:  Pt reports didn't get therapy this weekend, but sat up in bedside chair 6+ hours/per day.  Fairly good pain wise so far this AM, but hasn't gotten OOB- Has Tom on schedule today for massage.  LBM yesterday and 2 the day prior.    ROS:  Pt denies SOB, abd pain, CP, N/V/C/D, and vision changes   Objective:   No results found. No results for input(s): WBC, HGB, HCT, PLT in the last 72 hours.  No results for input(s): NA, K, CL, CO2, GLUCOSE, BUN, CREATININE, CALCIUM in the last 72 hours.   Intake/Output Summary (Last 24 hours) at 04/13/2021 0848 Last data filed at 04/13/2021 0725 Gross per 24 hour  Intake 651 ml  Output 1175 ml  Net -524 ml     Pressure Injury 04/10/21 Buttocks Left;Posterior;Proximal Stage 1 -  Intact skin with non-blanchable redness of a localized area usually over a bony prominence. (Active)  04/10/21   Location: Buttocks  Location Orientation: Left;Posterior;Proximal  Staging: Stage 1 -  Intact skin with non-blanchable redness of a localized area usually over a bony prominence.  Wound Description (Comments):   Present on Admission:     Physical Exam: Vital Signs Blood pressure (!) 146/74, pulse 79, temperature 98.5 F (36.9 C), temperature source Oral, resp. rate 17, height 5\' 8"  (1.727 m), weight 119.9 kg, SpO2 97 %.   General: awake, alert, appropriate, sitting up slightly in bed; flat affect; NAD HENT: conjugate gaze; oropharynx moist CV: regular rate; no JVD Pulmonary: CTA B/L; no W/R/R- good air movement GI: soft, NT, ND, (+)BS Psychiatric: appropriate but flat Neurological: alert  Skin: Warm and dry.  Stage I sacral ulcer left buttock. Musc: Very TTP over thoracic and lumbar paraspinals B/L  Neuro: Alert and oriented Motor: Bilateral upper extremities: 5/5 proximal distal Bilateral lower extremities: 4/5 proximal distal    Assessment/Plan: 1. Functional deficits which require 3+ hours per day of interdisciplinary therapy in a comprehensive inpatient rehab setting. Physiatrist is providing close team supervision and 24 hour management of active medical problems listed below. Physiatrist and rehab team continue to assess barriers to discharge/monitor patient progress toward functional and medical goals  Care Tool:  Bathing    Body parts bathed by patient: Right arm, Left arm, Chest, Abdomen, Face   Body parts bathed by helper: Front perineal area, Buttocks, Left lower leg, Right lower leg     Bathing assist Assist Level: Moderate Assistance - Patient 50 - 74%     Upper Body Dressing/Undressing Upper body dressing   What is the patient wearing?: Pull over shirt    Upper body assist Assist Level: Moderate Assistance - Patient 50 - 74%    Lower Body Dressing/Undressing Lower body dressing      What is the patient wearing?: Pants     Lower body assist Assist for lower body dressing: Total Assistance - Patient < 25%     Toileting Toileting    Toileting assist Assist for toileting: Dependent - Patient 0% (bed pan, bed level) Assistive Device Comment: able to manage urinal with set-up assist; reports need for suppository for BM   Transfers Chair/bed transfer  Transfers assist     Chair/bed transfer assist level: Dependent - mechanical lift     Locomotion Ambulation   Ambulation assist   Ambulation activity did not occur: Safety/medical concerns          Walk 10 feet activity   Assist  Walk 10 feet activity did not occur: Safety/medical concerns        Walk 50 feet activity   Assist Walk 50 feet with 2 turns activity did not occur: Safety/medical concerns         Walk 150 feet activity   Assist Walk 150 feet activity did not occur: Safety/medical concerns         Walk 10 feet on uneven surface  activity   Assist Walk 10 feet on uneven surfaces  activity did not occur: Safety/medical concerns         Wheelchair     Assist Is the patient using a wheelchair?: Yes Type of Wheelchair: Manual    Wheelchair assist level: Dependent - Patient 0%      Wheelchair 50 feet with 2 turns activity    Assist        Assist Level: Dependent - Patient 0%   Wheelchair 150 feet activity     Assist      Assist Level: Dependent - Patient 0%   Blood pressure (!) 146/74, pulse 79, temperature 98.5 F (36.9 C), temperature source Oral, resp. rate 17, height 5\' 8"  (1.727 m), weight 119.9 kg, SpO2 97 %.  Medical Problem List and Plan: 1.  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 05/31/2021.  TLSO back brace when out of bed. JP drain removed 10/7.2022  Con't PT and OT/CIR 2.  Impaired mobility: continue lovenox. Check vascular study             -antiplatelet therapy: N/A 3. Postoperative spinal pain: Voltaren 75 mg twice daily, Robaxin and oxycodone as needed. Added back dilaudid 1mg  q6H prn given severity of pain.   -10/9 adjust dilaudid to 2mg  q6 prn  -add tizanidine 2mg  tid for muscle spasms, continue robaxin q12 prn only  -encouraged pt to get oob as much as possible  10/11- will start ms contin 15 mg BID and stop Dliaudid and con't oxycodone- also add Cymbalta 30 mg QHS for nerve pain and mood.  10/14- wait on changing Oxycodone dose to 10-15 mg- for now- if pt progressing in therapy, won't make changes.    10/17- pain doing better- per pt- con't regimen 4. Mood: Provide emotional support  10/10- pt appears depressed- will d/w pt in AM about starting something that could also help pain.   10/11-  adding cymbalta 30 mg QHS. 10/17- will increase Cymbalta to 60 mg QHS             -antipsychotic agents: N/A 5. Neuropsych: This patient is capable of making decisions on his own behalf. 6. Skin/Wound Care: Routine skin checks 7. Fluids/Electrolytes/Nutrition: Routine in and outs 8.  ID.  Transitioned from intravenous cefazolin to amoxicillin.    10/13- will check with ID when to stop Aomoxicllin- around 10/20- per chart-  call Dr 11/17 or ID 9.  Acute blood loss anemia.  Latest hemoglobin 8.4.  Follow-up CBC Monday  Hemoglobin 8.5 on  10/10, labs ordered for Monday 10.  Hyponatremia.  Sodium 136 on 10/10 labs ordered for Monday 11.  Hypertension.  Continue Norvasc 5 mg daily.    -fair control so far despite pain 10/13- increased Norvasc to 10 mg daily  10/17- BP 146/74 today- con't regimen- just increased Norvasc late last week.  12.  Irritable bowel syndrome.  Amitiza 24 mcg twice daily 13.  History of gout.  Continue colchicine 14.  OSA.  CPAP 15.  Obesity.  Dietary follow-up 16.  Hypoalbuminemia  Supplement initiated on 10/15    LOS: 10 days A FACE TO FACE EVALUATION WAS PERFORMED  Jaclynn Laumann 04/13/2021, 8:48 AM

## 2021-04-13 NOTE — Consult Note (Signed)
Neuropsychological Consultation   Patient:   Marco Kennedy   DOB:   01-16-1951  MR Number:  [id number]  Location:  [facility] [address] CENTER A [id number] [place] [address] Dept: [phone] Loc: [phone]           Date of Service:   04/13/2021  Start Time:   8 AM End Time:   9 AM  Provider/Observer:  Arley Phenix, Psy.D.       Clinical Neuropsychologist       Billing Code/Service: 36644  Chief Complaint:    Marco Kennedy is a 70 year old male with history of obesity, OSA with CPAP.  Patient had recent admission 01/10/2021 for sepsis secondary to moderate-sized abscess near pelvic hardware.  Multiple interventions and antibiotic therapies were conducted.  Patient was admitted to rehab services 02/03/2021 through 02/16/2021 for vertebral osteomyelitis.  Patient was discharged home.  Patient presented 03/14/2021 with worsening of symptoms including spinal stenosis due to enlarging epidural abscess.  Patient had emergent surgery with multiple intervention strategies.  Patient was admitted to comprehensive rehab program.  For greater details see HPI below.  Patient working on coping with significant pain and difficulty differentiating between pain versus weakness due to neurological changes.  Reason for Service:  Patient referred for neuropsychological consultation due to coping and adjustment in dealing with extended hospital stay with first admission on 01/13/2021 and discharged and then readmitted with continued infection.  Below is HPI for the current admission.  HPI: Marco Kennedy is a 70 year old right-handed male with history of obesity, OSA maintained on CPAP and recent admission 01/10/2021 - 01/16/2021 for sepsis secondary to moderate-sized abscess near the pelvic hardware.  Neurosurgery interventional radiology and infectious disease were involved in his care at that point underwent wound  debridement with neurosurgery on 7/19 with E. coli from wound cultures.  He was given cefazolin 1 with plan of 6 weeks of treatment.  He was discharged to home noted bilateral rib pain needing to sleep in a lift chair.  Home health nurse 7/25 to assess the PICC line recommended follow-up PCP they decided to try telehealth but by that time patient was noted to have some coughing and nonspecific chest discomfort.  He developed low-grade fevers and was admitted 01/20/2021.  He did undergo bilateral pigtail catheters placed in pleural cavities.  Neurosurgery follow-up in regards to osteomyelitis no current plan for repeat surgical intervention at that time he was maintained on cefazolin and most recent blood cultures negative.  He was admitted to inpatient rehab services 02/03/2021 - 02/16/2021 for vertebral osteomyelitis.  He was discharged home ambulating 100 feet contact-guard assist.  Presented 03/14/2021 for worsening discitis as well as enlarging epidural abscess T9-T10 resulting in spinal stenosis.  Patient underwent emergent T9-T10 laminectomy for decompression of thecal sac 03/14/2021 per Dr. Conchita Paris.  Hospital course followed by neurosurgery Dr. Venetia Maxon patient returned to the OR for reconstruction and correction of thoracic kyphosis and decompression of thoracic spinal cord 03/31/2021 undergoing T6-T9 posterior decompression and extension of fusion to thoracic 11, decompression of spinal cord dura T9-T11 laminectomy, posterior lateral arthrodesis T6-T11 levels as well as placement of Jackson-Pratt drain.  TLSO back brace when out of bed only applied in sitting position.  Infectious disease consulted he has been transitioned from intravenous cefazolin to p.o. amoxicillin.  Maintained on subcutaneous Lovenox for DVT prophylaxis.  Therapy evaluations completed due to patient decreased functional mobility was admitted for a comprehensive rehab  program. He complains of severe pain currently while working with  therapy.  Current Status:  Upon entering the room, the patient was in the supine position just slightly elevated head.  Patient awake and alert denying significant pain when lying still.  Patient was oriented with good mental status and aware of and able to remember previous therapeutic interventions over the past several days.  Patient acknowledged significant pain when trying to set up and reports that sitting up is the most painful part of getting out of bed.  Patient reports that he has stood but has some fear around standing pain versus further injury from these efforts.  Behavioral Observation: Marco Kennedy  presents as a 70 y.o.-year-old Right handed Caucasian Male who appeared his stated age. his dress was Appropriate and he was Well Groomed and his manners were Appropriate to the situation.  his participation was indicative of Appropriate behaviors.  There were physical disabilities noted.  he displayed an appropriate level of cooperation and motivation.     Interactions:    Active Appropriate  Attention:   within normal limits and attention span and concentration were age appropriate  Memory:   within normal limits; recent and remote memory intact  Visuo-spatial:  not examined  Speech (Volume):  low  Speech:   normal; normal  Thought Process:  Coherent and Relevant  Though Content:  WNL; not suicidal and not homicidal  Orientation:   person, place, time/date, and situation  Judgment:   Good  Planning:   Fair  Affect:    Anxious  Mood:    Anxious  Insight:   Good  Intelligence:   normal  Medical History:   Past Medical History:  Diagnosis Date   Arthritis    arthritis-bilateral knees, left Hip.   Degenerative lumbar spinal stenosis    EKG abnormality    06-08-16- being evaluated by cardiologist  in [place]   Idiopathic scoliosis of lumbar region    Joint stiffness    Knee pain    Leg swelling    Low back pain    Achy, shooting pain.  Hurts to Walk or  stand up straight.   Lumbar radiculopathy    Scoliosis concern    Sleep apnea    Bipap use nightly 25/19 -3l/m oxygen   Spondylolisthesis of lumbar region    Transfusion history    [place] ,[place] after bleeding post colon polyp removal and colonscopy procedure         Patient Active Problem List   Diagnosis Date Noted   Hypoalbuminemia due to protein-calorie malnutrition [facility])    Essential hypertension    Acute blood loss anemia    Labile blood pressure    Postoperative pain    Discitis of thoracic region    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 coli) (HCC) 02/01/2021   Empyema (HCC)    Anasarca 01/20/2021   OSA (obstructive sleep apnea) 01/20/2021   Obesity, Class III, BMI 40-49.9 (morbid obesity) (HCC) 01/20/2021   Pleural effusion    Vertebral osteomyelitis (HCC)    Hardware complicating wound infection (HCC)    Abscess of back 01/11/2021   Lumbar scoliosis 04/18/2020   Back pain 12/25/2019  S/P left THA, AA 06/15/2016     Psychiatric History:  No prior psychiatric history  Family Med/Psych History:  Family History  Problem Relation Age of Onset   Hypertension Mother    Arthritis Mother    Stomach cancer Father    Hypertension Brother     Impression/DX:  NATION CRADLE is a 70 year old male with history of obesity, OSA with CPAP.  Patient had recent admission 01/10/2021 for sepsis secondary to moderate-sized abscess near pelvic hardware.  Multiple interventions and antibiotic therapies were conducted.  Patient was admitted to rehab services 02/03/2021 through 02/16/2021 for vertebral osteomyelitis.  Patient was discharged home.  Patient presented 03/14/2021 with worsening of symptoms including spinal stenosis  due to enlarging epidural abscess.  Patient had emergent surgery with multiple intervention strategies.  Patient was admitted to comprehensive rehab program.  For greater details see HPI below.  Patient working on coping with significant pain and difficulty differentiating between pain versus weakness due to neurological changes.  Upon entering the room, the patient was in the supine position just slightly elevated head.  Patient awake and alert denying significant pain when lying still.  Patient was oriented with good mental status and aware of and able to remember previous therapeutic interventions over the past several days.  Patient acknowledged significant pain when trying to set up and reports that sitting up is the most painful part of getting out of bed.  Patient reports that he has stood but has some fear around standing pain versus further injury from these efforts.  Disposition/Plan:  Today we worked on coping and adjustment issues with the patient with extended hospital stay he had multiple back surgeries.  Patient has been in and out of the hospital since July 19th and is continuing to make only slow improvements with difficulty going beyond standing.  Patient with a great deal of anxiety and apprehension around fear of further injury.  We need to confirm that the patient is using CPAP if possible.           Electronically Signed   _______________________ Arley Phenix, Psy.D. Clinical Neuropsychologist

## 2021-04-13 NOTE — Progress Notes (Signed)
Occupational Therapy Session Note  Patient Details  Name: Marco Kennedy MRN: [id number] Date of Birth: 1950-08-12  Today's Date: 04/13/2021 OT Individual Time: 1100-1155 OT Individual Time Calculation (min): 55 min    Short Term Goals: Week 1:  OT Short Term Goal 1 (Week 1): Patient will don UB clothing seated EOB with Min A. OT Short Term Goal 2 (Week 1): Patient will complete squat-pivot transfers with Mod A +2. OT Short Term Goal 3 (Week 1): Patient will don LB clothing at bed level with Mod A of 1 helper and AD PRN.  Skilled Therapeutic Interventions/Progress Updates:    Pt resting in bed upon arrival and agreeable to getting OOB. Initial focus on soft tissue mobilizations Lt lower back (QL) for 4 trigger points; relief noted by pt. Supine>sit EOB with max A+2. Dependent for donning TLSO. SB transfer to w/c with max A+2. Pt propelled w/c in hallway with supervision. BUE therex on SciFit 5 minx2, level 5. Pt returned to room and remained in w/c with all needs within reach. Pt reported that his pain was better then previous days.   Therapy Documentation Precautions:  Precautions Precautions: Fall, Back Precaution Booklet Issued: No Precaution Comments: reviewed back precautions Required Braces or Orthoses: Spinal Brace Spinal Brace: Thoracolumbosacral orthotic, Applied in sitting position Spinal Brace Comments: TLSO when OOB only Restrictions Weight Bearing Restrictions: No  Pain: Pt reports 3/10 low back pain at rest in bed and sitting in w/c; 7/10 with supne>sit and sitting while TLSO donned; repositioned Therapy/Group: Individual Therapy  Rich Brave 04/13/2021, 12:20 PM

## 2021-04-13 NOTE — Progress Notes (Signed)
Physical Therapy Weekly Progress Note  Patient Details  Name: Marco Kennedy MRN: [id number] Date of Birth: [birth year]  Beginning of progress report period: April 03, 2021 End of progress report period: April 13, 2021  Today's Date: 04/13/2021 PT Individual Time: 1307-1400 PT Individual Time Calculation (min): 53 min   Patient has met 3 of 3 short term goals.  Pt performs Sit to stand with max A of 1 from elevated surface, w/c mobility >200 ft, min A slideboard transfers.  Patient continues to demonstrate the following deficits muscle weakness, decreased cardiorespiratoy endurance, impaired timing and sequencing, abnormal tone, and decreased coordination, and decreased sitting balance, decreased standing balance, and difficulty maintaining precautions and therefore will continue to benefit from skilled PT intervention to increase functional independence with mobility.  Patient progressing toward long term goals..  Continue plan of care.  PT Short Term Goals Week 1:  PT Short Term Goal 1 (Week 1): Pt will tolerate sitting upright x 5 min with no more than 5/10 pain PT Short Term Goal 1 - Progress (Week 1): Met PT Short Term Goal 2 (Week 1): Pt will initiate w/c mobility PT Short Term Goal 2 - Progress (Week 1): Met PT Short Term Goal 3 (Week 1): Pt will perform bed mobility with mod A consistently adhering to back precautions PT Short Term Goal 3 - Progress (Week 1): Met Week 2:  PT Short Term Goal 1 (Week 2): Pt will perform stand pivot transfer with RW PT Short Term Goal 2 (Week 2): Pt will perform STS with min A PT Short Term Goal 3 (Week 2): Pt will initiate gait training  Skilled Therapeutic Interventions/Progress Updates:  Pt seated in w/c on arrival and agreeable to therapy. Pt reports some unrated pain, but that he has received pain medication recently. Pt transported to therapy gym for time management and energy conservation. Slideboard transfer w/c<>mat table with min A to  prevent pt from sliding forward. Max cues to improve anterior weight shift for safer transfer, min improvement as pt is limited by pain. Pt then directed in Sit to stand from elevated mat table to RW with max A of 1 with assist to assist with knee extension and facilitate anterior weight shift. Pt is unable to stand from w/c at this time after several attempts, with and without +2 assist. Pt then directed in standing marches, working toward stand pivot transfer. Pt was able to march in place and take small steps, but had difficulty initiating turn to transfer. Pt propelled w/c with BUE back to room for functional mobility and endurance. Pt remained in w/c at end of session and was left with all needs in reach and alarm active.   Ambulation/gait training;Balance/vestibular training;Community reintegration;Discharge planning;Disease management/prevention;DME/adaptive equipment instruction;Functional electrical stimulation;Functional mobility training;Neuromuscular re-education;Pain management;Psychosocial support;Patient/family education;Splinting/orthotics;Stair training;Therapeutic Activities;Therapeutic Exercise;UE/LE Strength taining/ROM;UE/LE Coordination activities;Wheelchair propulsion/positioning   Therapy Documentation Precautions:  Precautions Precautions: Fall, Back Precaution Booklet Issued: No Precaution Comments: reviewed back precautions Required Braces or Orthoses: Spinal Brace Spinal Brace: Thoracolumbosacral orthotic, Applied in sitting position Spinal Brace Comments: TLSO when OOB only Restrictions Weight Bearing Restrictions: No    Therapy/Group: Individual Therapy  Mickel Fuchs 04/13/2021, 4:27 PM

## 2021-04-14 DIAGNOSIS — L8990 Pressure ulcer of unspecified site, unspecified stage: Secondary | ICD-10-CM | POA: Insufficient documentation

## 2021-04-14 MED ORDER — SORBITOL 70 % SOLN
30.0000 mL | Freq: Once | Status: AC
  Administered 2021-04-14: 30 mL via ORAL
  Filled 2021-04-14: qty 30

## 2021-04-14 NOTE — Progress Notes (Signed)
PROGRESS NOTE   Subjective/Complaints:  Pt reports did SB transfer to w/c- did better per pt and OT- mod A x1- which is MUCH better.  Worked better/pain wise due to getting meds before working with OT_ also Elijah Birk was real helpful with myofascial release.  LBM x2 on Sunday, but feels constipated- will give Sorbitol after therapy.    ROS:   Pt denies SOB, abd pain, CP, N/V/(+) C/D, and vision changes  Objective:   No results found. Recent Labs    04/13/21 0917  WBC 8.3  HGB 9.3*  HCT 29.7*  PLT 360    Recent Labs    04/13/21 0917  NA 136  K 3.7  CL 99  CO2 30  GLUCOSE 146*  BUN 14  CREATININE 0.72  CALCIUM 8.5*     Intake/Output Summary (Last 24 hours) at 04/14/2021 0854 Last data filed at 04/14/2021 0700 Gross per 24 hour  Intake 636 ml  Output 1225 ml  Net -589 ml     Pressure Injury 04/10/21 Buttocks Left;Posterior;Proximal Stage 1 -  Intact skin with non-blanchable redness of a localized area usually over a bony prominence. (Active)  04/10/21   Location: Buttocks  Location Orientation: Left;Posterior;Proximal  Staging: Stage 1 -  Intact skin with non-blanchable redness of a localized area usually over a bony prominence.  Wound Description (Comments):   Present on Admission:     Physical Exam: Vital Signs Blood pressure (!) 154/87, pulse 78, temperature 98.8 F (37.1 C), temperature source Oral, resp. rate 16, height 5\' 8"  (1.727 m), weight 119.9 kg, SpO2 94 %.    General: awake, alert, appropriate, sitting up in w/c in room; OT and tech in room; has TLSO in place; NAD HENT: conjugate gaze; oropharynx moist CV: regular rate; no JVD Pulmonary: CTA B/L; no W/R/R- good air movement GI: soft, NT, maybe a little distended, (+)BS; hypoactive Psychiatric: appropriate- smiling/laughing some today Neurological: Ox3  Skin: Warm and dry.  Stage I sacral ulcer left buttock. Musc: Very TTP over  thoracic and lumbar paraspinals B/L  Neuro: Alert and oriented Motor: Bilateral upper extremities: 5/5 proximal distal Bilateral lower extremities: 4/5 proximal distal   Assessment/Plan: 1. Functional deficits which require 3+ hours per day of interdisciplinary therapy in a comprehensive inpatient rehab setting. Physiatrist is providing close team supervision and 24 hour management of active medical problems listed below. Physiatrist and rehab team continue to assess barriers to discharge/monitor patient progress toward functional and medical goals  Care Tool:  Bathing    Body parts bathed by patient: Right arm, Left arm, Chest, Abdomen, Face   Body parts bathed by helper: Front perineal area, Buttocks, Left lower leg, Right lower leg     Bathing assist Assist Level: Moderate Assistance - Patient 50 - 74%     Upper Body Dressing/Undressing Upper body dressing   What is the patient wearing?: Pull over shirt    Upper body assist Assist Level: Moderate Assistance - Patient 50 - 74%    Lower Body Dressing/Undressing Lower body dressing      What is the patient wearing?: Pants     Lower body assist Assist for lower body dressing: Total Assistance -  Patient < 25%     Toileting Toileting    Toileting assist Assist for toileting: Dependent - Patient 0% (bed pan, bed level) Assistive Device Comment: able to manage urinal with set-up assist; reports need for suppository for BM   Transfers Chair/bed transfer  Transfers assist     Chair/bed transfer assist level: Dependent - mechanical lift     Locomotion Ambulation   Ambulation assist   Ambulation activity did not occur: Safety/medical concerns          Walk 10 feet activity   Assist  Walk 10 feet activity did not occur: Safety/medical concerns        Walk 50 feet activity   Assist Walk 50 feet with 2 turns activity did not occur: Safety/medical concerns         Walk 150 feet  activity   Assist Walk 150 feet activity did not occur: Safety/medical concerns         Walk 10 feet on uneven surface  activity   Assist Walk 10 feet on uneven surfaces activity did not occur: Safety/medical concerns         Wheelchair     Assist Is the patient using a wheelchair?: Yes Type of Wheelchair: Manual    Wheelchair assist level: Dependent - Patient 0%      Wheelchair 50 feet with 2 turns activity    Assist        Assist Level: Dependent - Patient 0%   Wheelchair 150 feet activity     Assist      Assist Level: Dependent - Patient 0%   Blood pressure (!) 154/87, pulse 78, temperature 98.8 F (37.1 C), temperature source Oral, resp. rate 16, height 5\' 8"  (1.727 m), weight 119.9 kg, SpO2 94 %.  Medical Problem List and Plan: 1.  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 05/31/2021.  TLSO back brace when out of bed. JP drain removed 10/7.2022  Continue CIR- PT, OT  2.  Impaired mobility: continue lovenox. Check vascular study             -antiplatelet therapy: N/A 3. Postoperative spinal pain: Voltaren 75 mg twice daily, Robaxin and oxycodone as needed. Added back dilaudid 1mg  q6H prn given severity of pain.   -10/9 adjust dilaudid to 2mg  q6 prn  -add tizanidine 2mg  tid for muscle spasms, continue robaxin q12 prn only  -encouraged pt to get oob as much as possible  10/11- will start ms contin 15 mg BID and stop Dliaudid and con't oxycodone- also add Cymbalta 30 mg QHS for nerve pain and mood.  10/18- pain doing better when gets meds before therapy- con't regimen 4. Mood: Provide emotional support  10/10- pt appears  depressed- will d/w pt in AM about starting something that could also help pain.   10/11- adding cymbalta 30 mg QHS. 10/17- will increase Cymbalta to 60 mg QHS  10/18- no side effects so far- so unlikely- will likely improve in the next few days mood and pain.              -antipsychotic agents: N/A 5. Neuropsych: This patient is capable of making decisions on his own behalf. 6. Skin/Wound Care: Routine skin checks 7. Fluids/Electrolytes/Nutrition: Routine in and outs  8.  ID.  Transitioned from intravenous cefazolin to amoxicillin.    10/13- will check with ID when to stop Aomoxicllin- around 10/20- per chart-  call Dr Dorna Bloom or ID 9.  Acute blood loss anemia.  Latest hemoglobin 8.4.  Follow-up CBC Monday  Hemoglobin 8.5 on 10/10, labs ordered for Monday 10.  Hyponatremia.  Sodium 136 on 10/10 labs ordered for Monday 11.  Hypertension.  Continue Norvasc 5 mg daily.    -fair control so far despite pain 10/13- increased Norvasc to 10 mg daily  10/17- BP 146/74 today- con't regimen- just increased Norvasc late last week.   10/18- wBP 150s/70s- con't regimen- might need to add something soon, but will give Norvasc time to work first.  12.  Irritable bowel syndrome.  Amitiza 24 mcg twice daily 13.  History of gout.  Continue colchicine 14.  OSA.  CPAP 15.  Obesity.  Dietary follow-up 16.  Hypoalbuminemia  Supplement initiated on 10/15 17. Constipation  10/18- will give Sorbitol 30cc x1 at 4pm.     LOS: 11 days A FACE TO FACE EVALUATION WAS PERFORMED  Arne Schlender 04/14/2021, 8:54 AM

## 2021-04-14 NOTE — Progress Notes (Signed)
[name], male   DOB: 07-14-1950, 70 y.o.   MRN: [id number]  Met with pt to discuss team conference goals of min assist and target discharge date of 11/11. He realizes he has a ways to go and needs to reach these goals for going home. Wife is only able to provide min assist level. He is aware of his levels currently. Will call wife to let her know and ask if any questions.

## 2021-04-14 NOTE — Progress Notes (Signed)
Occupational Therapy Weekly Progress Note  Patient Details  Name: Marco Kennedy MRN: [id number] Date of Birth: 05-Aug-1950  Beginning of progress report period: April 04, 2021 End of progress report period: April 14, 2021  Today's Date: 04/14/2021 OT Individual Time: 6294-7654 and 1300-1340 OT Individual Time Calculation (min): 56 min and 40 min   Patient has met 0 of 3 short term goals.  Pt is slowly making progress toward OT goals, as the pt has been significantly impaired 2/2 back pain which is slowly improving. Bed mobility supine > sit Max A, slide board transfer bed <> wheelchair Mod/Max of 1 with 2nd caregiver present for safety, and bed level LB dressing Max/total, UB dressing Mod/Max sitting up in wheelchair including TLSO. Pt has also improved to sit <> stands in [place] with Mod A and 2nd helper.   Patient continues to demonstrate the following deficits: muscle weakness, decreased cardiorespiratoy endurance, impaired timing and sequencing, decreased coordination, and decreased motor planning, and decreased sitting balance, decreased standing balance, decreased postural control, and decreased balance strategies and therefore will continue to benefit from skilled OT intervention to enhance overall performance with BADL, iADL, and Reduce care partner burden.  Patient progressing toward long term goals..  Continue plan of care.  OT Short Term Goals Week 1:  OT Short Term Goal 1 (Week 1): Patient will don UB clothing seated EOB with Min A. OT Short Term Goal 1 - Progress (Week 1): Progressing toward goal OT Short Term Goal 2 (Week 1): Patient will complete squat-pivot transfers with Mod A +2. OT Short Term Goal 2 - Progress (Week 1): Progressing toward goal OT Short Term Goal 3 (Week 1): Patient will don LB clothing at bed level with Mod A of 1 helper and AD PRN. OT Short Term Goal 3 - Progress (Week 1): Progressing toward goal Week 2:  OT Short Term Goal 1 (Week 2): pt will don  shirt/TLSO Min A at EOB or up in w/c OT Short Term Goal 2 (Week 2): Pt will perform slide board transfer with Min A in prep for ADL OT Short Term Goal 3 (Week 2): Patient will don LB clothing at bed level with Mod A of 1 helper and AE PRN. OT Short Term Goal 4 (Week 2): Pt will perform sit <> stands in prep for ADL at [place] with  Mod A of 1  Skilled Therapeutic Interventions/Progress Updates:    Session 1: Pt greeted at time of session supine in bed resting, agreeable to OT session but requesting sessions not be scheduled until 8:30 or 9 next time. Relayed this to scheduling staff during session. Donned TEDS total A and shorts Max A with reacher to thread and able to roll L/R to help in side lying donning over hips. Supine > sit Max A with 2nd helper spotting. Sitting EOB pt in significant back pain, but able to tolerate sitting EOB with improved tolerance today. Total A to don TLSO but able to assist moving Ue's out of the way when donning today. Slide board bed > wheelchair Mod/Max of 1 with second helper spotting for safety and holding board in place. Self propel to sink for UB bathing/dressing. Set up in chair with call bell in reach all needs met. Note RN present during session for med pass for pain meds.   Session 2: Pt greeted at time of session supine in bed resting, stating his L low back was feeling "sore" like he may have worked too hard this am.  However, pt willing to try to participate in OT session. Initial part of session in side lying on R side with therapist providing STM to L lower back with palpable tension. Supine > sit 2 helpers, eventually needing Max A and pt in severe pain, yelling out sitting EOB and unable to participate or remove hands from bed surface 2/2 pain. Unable to verbalize # for pain. Returned to supine 2 helpers with relief noted. Applied moist heat pack to low back surface for 10 min minutes with skin in tact pre and post. Discussed with nursing staff as well regarding  pain and pt unable to receive pain meds for approx 4 more hours and pt aware. Left resting bed level alarm on call bell in reach.   Therapy Documentation Precautions:  Precautions Precautions: Fall, Back Precaution Booklet Issued: No Precaution Comments: reviewed back precautions Required Braces or Orthoses: Spinal Brace Spinal Brace: Thoracolumbosacral orthotic, Applied in sitting position Spinal Brace Comments: TLSO when OOB only Restrictions Weight Bearing Restrictions: No    Therapy/Group: Individual Therapy  Viona Gilmore 04/14/2021, 7:11 AM

## 2021-04-14 NOTE — Patient Care Conference (Signed)
Inpatient RehabilitationTeam Conference and Plan of Care Update Date: 04/14/2021   Time: 11:33 AM    Patient Name: Marco Kennedy      Medical Record Number: [id number]  Date of Birth: 1950/10/02 Sex: Male         Room/Bed: 4W06C/4W06C-01 Payor Info: Payor: BLUE CR[employer] BLUE [employer]IELD / Plan: BCBS COMM PPO / Product Type: *No Product type* /    Admit Date/Time:  04/03/2021  2:44 PM  Primary Diagnosis:  Epidural abscess  Hospital Problems: Principal Problem:   Epidural abscess Active Problems:   Hypoalbuminemia due to protein-calorie malnutrition (HCC)   Essential hypertension   Acute blood loss anemia   Labile blood pressure   Postoperative pain   Pressure injury of skin    Expected Discharge Date: Expected Discharge Date: 05/08/21  Team Members Present: Physician leading conference: Dr. Genice Rouge Social Worker Present: Dossie Der, LCSW Nurse Present: Kennyth Arnold, RN PT Present: Bernie Covey, PT OT Present: Earleen Newport, OT PPS Coordinator present : Fae Pippin, SLP     Current Status/Progress Goal Weekly Team Focus  Bowel/Bladder   Pt continent of B/B. LBM 04/12/2021  Continued regular BMs every 3 days or less  Assess B/B every shift and PRN   Swallow/Nutrition/ Hydration             ADL's   Mod/Max of 1 with 2nd person for safety (fluctuates), stedy toilet transfers, Max/total LB bathe and dress with AE, total A to don TLSO, back pain slightly improved  Min A overall  bed mobilty for self care, continue OOB, stedy transfers, sit <> stands, ADL retraining   Mobility   mod A bed mobility, min A slideboard, max x 1 STS to RW, knees buckle in ~30 sec of standing, still limited by pain  min A transfers, mod I w/c  LE strength stand pivot transfer   Communication             Safety/Cognition/ Behavioral Observations            Pain   Pt rating pain a 3/10. Scheduled pain medications and PRN meds  Pain <3/10  Assess pain every shift and PRN   Skin    Incision to middle back.  no new breakdown  Assess skin every shift and PRN     Discharge Planning:  Doing better this week and in less pain according to pt, plan for home with wife who will take some time off and/or work from home   Team Discussion: Constipated, giving Sorbitol. Tom worked on myofascial release. Reported pain when moving 9/10. Continent/incontinent, LBM 10/15. No pain reported when in bed. Stage 1 to left buttock with foam dressing, back incision CDI. Wife works from home and plans to take some time off at discharge. Patient on target to meet rehab goals: yes, doing some better this week. Tolerating EOB sitting. Slide board transfers fluctuate, needs heavy cueing. Max/total assist lower body dressing. Min assist goals. Can do STS +1 assist from elevated surface. Knees buckle quickly.   *See Care Plan and progress notes for long and short-term goals.   Revisions to Treatment Plan:  Gave Sorbitol for constipation, adjusting medications.  Teaching Needs: Family education, medication management, pain management, skin/wound care, bowel/bladder management, safety awareness.  Current Barriers to Discharge: Decreased caregiver support, Home enviroment access/layout, Incontinence, Wound care, Weight, Weight bearing restrictions, and Medication compliance  Possible Resolutions to Barriers: Family education, gave medications for constipation, wife to take time off at discharge.  Medical Summary Current Status: HTN- a little high- just increased norvasc; inconitnent/continent- LBM 10/15; pain very limiting with movement; Stage I to L buttock;  Barriers to Discharge: Decreased family/caregiver support;Home enviroment access/layout;Medical stability;Weight;Weight bearing restrictions;Wound care  Barriers to Discharge Comments: wife works from home- can't do more than min A- no nursing home benefit Possible Resolutions to [employer]: will give sorbitol for constipation-  can now do sliding board/EOB work- max A of 2 to mod A of 1 for SB transfer- sit-stand using the steady; dressing in bed; pain biggest limiter; knees buckling- d/c- 11/11   Continued Need for Acute Rehabilitation Level of Care: The patient requires daily medical management by a physician with specialized training in physical medicine and rehabilitation for the following reasons: Direction of a multidisciplinary physical rehabilitation program to maximize functional independence : Yes Medical management of patient stability for increased activity during participation in an intensive rehabilitation regime.: Yes Analysis of laboratory values and/or radiology reports with any subsequent need for medication adjustment and/or medical intervention. : Yes   I attest that I was present, lead the team conference, and concur with the assessment and plan of the team.   Tennis Must 04/14/2021, 4:19 PM

## 2021-04-14 NOTE — Progress Notes (Signed)
Physical Therapy Session Note  Patient Details  Name: Marco Kennedy MRN: [id number] Date of Birth: 02-04-1951  Today's Date: 04/14/2021 PT Individual Time: 1032-1130,1505-1532 PT Individual Time Calculation (min): 58 min, 27 min  Short Term Goals: Week 1:  PT Short Term Goal 1 (Week 1): Pt will tolerate sitting upright x 5 min with no more than 5/10 pain PT Short Term Goal 1 - Progress (Week 1): Met PT Short Term Goal 2 (Week 1): Pt will initiate w/c mobility PT Short Term Goal 2 - Progress (Week 1): Met PT Short Term Goal 3 (Week 1): Pt will perform bed mobility with mod A consistently adhering to back precautions PT Short Term Goal 3 - Progress (Week 1): Met Week 2:  PT Short Term Goal 1 (Week 2): Pt will perform stand pivot transfer with RW PT Short Term Goal 2 (Week 2): Pt will perform STS with min A PT Short Term Goal 3 (Week 2): Pt will initiate gait training  Skilled Therapeutic Interventions/Progress Updates:    Session 1: Pt seated in w/c on arrival and agreeable to therapy. Pt reported 4/10 pain at rest, premedicated, up to 8/10 with activity. Pt transported to therapy gym for time management and energy conservation. Pt performed the following exercises to promote LE strength and endurance, and prepare for standing activity: seated marches with tapping facilitation at L hip flexor 3 x 10, seated LAQ 2 x 10, kinetron at 80 cm/sec, 2 x 3 min with 2 min rest break. Pt required extended rest breaks throughout d/t fatigue. Pt demoed poor muscle activation against gravity on the L side with each exercise. Pt then transitioned to standing activity. Sit to stand from slightly elevated mat table x 5 with knee block and min A to power up from behind. During each stand, pt was encouraged to decrease UE support, eventually being able to tap hand to opposite shoulder. Pt performed slideboard transfer with min A to prevent anterior slide off board x 3, w/c>mat>w/c>bed, +2 present for safety. Pt  returned to bed after session, mod A sit>supine for LE management. Pt was left with all needs in reach and alarm active.   Session 2: Pt received in bed, reported increased pain this PM, 4-5/10 and requesting not to get out of bed. Manual therapy x 25 min for pain relief. Noted new, large trigger points in paraspinals and QL on L side. Pt reported some improvement at end of session, pain decr to 3/10. Pt remained in semi-sidelying on R side with pillows to assist positioning. Pt was left with all needs in reach and alarm active.   Therapy Documentation Precautions:  Precautions Precautions: Fall, Back Precaution Booklet Issued: No Precaution Comments: reviewed back precautions Required Braces or Orthoses: Spinal Brace Spinal Brace: Thoracolumbosacral orthotic, Applied in sitting position Spinal Brace Comments: TLSO when OOB only Restrictions Weight Bearing Restrictions: No   Therapy/Group: Individual Therapy  Mickel Fuchs 04/14/2021, 12:22 PM

## 2021-04-15 MED ORDER — SORBITOL 70 % SOLN
60.0000 mL | Freq: Once | Status: AC
  Administered 2021-04-15: 60 mL via ORAL
  Filled 2021-04-15: qty 60

## 2021-04-15 MED ORDER — OXYCODONE HCL 5 MG PO TABS
10.0000 mg | ORAL_TABLET | ORAL | Status: DC | PRN
  Administered 2021-04-20 – 2021-05-08 (×15): 10 mg via ORAL
  Filled 2021-04-15 (×15): qty 2

## 2021-04-15 MED ORDER — POLYETHYLENE GLYCOL 3350 17 G PO PACK
17.0000 g | PACK | Freq: Two times a day (BID) | ORAL | Status: DC
  Administered 2021-04-16 – 2021-05-07 (×39): 17 g via ORAL
  Filled 2021-04-15 (×44): qty 1

## 2021-04-15 MED ORDER — OXYCODONE HCL 5 MG PO TABS
15.0000 mg | ORAL_TABLET | ORAL | Status: DC | PRN
  Administered 2021-04-15 – 2021-05-01 (×22): 15 mg via ORAL
  Filled 2021-04-15 (×27): qty 3

## 2021-04-15 MED ORDER — FLEET ENEMA 7-19 GM/118ML RE ENEM
1.0000 | ENEMA | Freq: Once | RECTAL | Status: DC
  Filled 2021-04-15 (×2): qty 1

## 2021-04-15 NOTE — Progress Notes (Signed)
Pt inquired about cpap machine because he wears one at home but was not wearing one here. Called on call PA Love for order. Order placed and respiratory contacted to bring down. CPAP in place and patient sleeping. No other concerns to report.

## 2021-04-15 NOTE — Progress Notes (Addendum)
Occupational Therapy Session Note  Patient Details  Name: Marco Kennedy MRN: [id number] Date of Birth: 1950/10/03  Today's Date: 04/15/2021 OT Individual Time: 7741-2878 and 6767-2094 OT Individual Time Calculation (min): 75 min  and 43 min   Short Term Goals: Week 1:  OT Short Term Goal 1 (Week 1): Patient will don UB clothing seated EOB with Min A. OT Short Term Goal 1 - Progress (Week 1): Progressing toward goal OT Short Term Goal 2 (Week 1): Patient will complete squat-pivot transfers with Mod A +2. OT Short Term Goal 2 - Progress (Week 1): Progressing toward goal OT Short Term Goal 3 (Week 1): Patient will don LB clothing at bed level with Mod A of 1 helper and AD PRN. OT Short Term Goal 3 - Progress (Week 1): Progressing toward goal Week 2:  OT Short Term Goal 1 (Week 2): pt will don shirt/TLSO Min A at EOB or up in w/c OT Short Term Goal 2 (Week 2): Pt will perform slide board transfer with Min A in prep for ADL OT Short Term Goal 3 (Week 2): Patient will don LB clothing at bed level with Mod A of 1 helper and AE PRN. OT Short Term Goal 4 (Week 2): Pt will perform sit <> stands in prep for ADL at [facility] with  Mod A of 1   Skilled Therapeutic Interventions/Progress Updates:    Pt greeted at time of session supine in bed resting, premedicated per pt and NT. Pt did not have pain resting but did have back pain with mobility, 7/10 with mobility but improved with rest breaks and positional changes. Donned TEDS dependent and shorts with reacher for AE 2/2 back precautions, assist managing BLE's into shorts. Bed mobility rolling L/R to don over hips with Min/Mod A, supine > sit with multiple cues for form and mainly assist for trunk elevation with Max A. Sitting EOB with significant increase in back pain which improved with cues for pursed lip breathing and shoulder relaxation techniques. Total A for slide board placement, slide board transfer bed > wheelchair with Mod A with max cues for  form and assist managing foot placement. Scooting back in chair to decrease sacral sitting and improve positioning. Self propel to sink for oral hygiene set up assist, and UB bathing with assist to fully reach back. UB dressing for pull over shirt Min A but total A for TLSO, pt able to assist to day with donning TLSO. Self propel partially to gym with OT taking over 2/2 fatigue, performed SCIFIT on level 4 for 5 minutes for improved UB strength and global endurance. Back in room, continued UB strengthening with red level theraband for various exercises, needing cues for form and repetition to maximize carryover. Up in chair alarm on, water provided, call bell in reach.   Session 2: Pt greeted at time of session up in wheelchair agreeable to OT session, back pain controlled during session and did not provide #. Self propel partially to gym with OT resuming with fatigue, set up at [facility] and performed 2 rounds of dynamic seated activity to encourage hip flexion (within back precautions) for reaching for objects. Pt participated in standing frame activity for 8:30 for weight bearing through BLEs to promote NMR and while standing performed 1x10-12 exercises with 2# dowel rod. Back in chair, self propel to room and set up alarm on call bell in reach.    Therapy Documentation Precautions:  Precautions Precautions: Fall, Back Precaution Booklet Issued: No Precaution Comments:  reviewed back precautions Required Braces or Orthoses: Spinal Brace Spinal Brace: Thoracolumbosacral orthotic, Applied in sitting position Spinal Brace Comments: TLSO when OOB only Restrictions Weight Bearing Restrictions: No    Therapy/Group: Individual Therapy  Erasmo Score 04/15/2021, 7:09 AM

## 2021-04-15 NOTE — Progress Notes (Signed)
PROGRESS NOTE   Subjective/Complaints:  Pt reports very constipated feeling- no BM with sorbitol yesterday-  Been since Sunday.  Yesterday went back to bed after AM therapy- and couldn't , literally, get OOB in afternoon- pain was too strong.   Got CPAP and used- slept better.  Pain is just so limiting for him.  ROS:   Pt denies SOB, abd pain, CP, N/V/ (+) C/D, and vision changes   Objective:   No results found. Recent Labs    04/13/21 0917  WBC 8.3  HGB 9.3*  HCT 29.7*  PLT 360    Recent Labs    04/13/21 0917  NA 136  K 3.7  CL 99  CO2 30  GLUCOSE 146*  BUN 14  CREATININE 0.72  CALCIUM 8.5*     Intake/Output Summary (Last 24 hours) at 04/15/2021 0813 Last data filed at 04/15/2021 0550 Gross per 24 hour  Intake 370 ml  Output 1100 ml  Net -730 ml     Pressure Injury 04/10/21 Buttocks Left;Posterior;Proximal Stage 1 -  Intact skin with non-blanchable redness of a localized area usually over a bony prominence. (Active)  04/10/21   Location: Buttocks  Location Orientation: Left;Posterior;Proximal  Staging: Stage 1 -  Intact skin with non-blanchable redness of a localized area usually over a bony prominence.  Wound Description (Comments):   Present on Admission:     Physical Exam: Vital Signs Blood pressure (!) 155/97, pulse 86, temperature 98.1 F (36.7 C), resp. rate 18, height 5\' 8"  (1.727 m), weight 119.9 kg, SpO2 98 %.     General: awake, alert, appropriate, sitting up in bed; eating breakfast 75% or more; NAD HENT: conjugate gaze; oropharynx moist CV: regular rate; no JVD Pulmonary: CTA B/L; no W/R/R- good air movement GI: soft, NT, distended; hypoactive in spite of eating Psychiatric: appropriate; flat, but better Neurological: Ox3   Skin: Warm and dry.  Stage I sacral ulcer left buttock. Musc: Very TTP over thoracic and lumbar paraspinals B/L - no change Neuro: Alert and  oriented Motor: Bilateral upper extremities: 5/5 proximal distal Bilateral lower extremities: 4/5 proximal distal   Assessment/Plan: 1. Functional deficits which require 3+ hours per day of interdisciplinary therapy in a comprehensive inpatient rehab setting. Physiatrist is providing close team supervision and 24 hour management of active medical problems listed below. Physiatrist and rehab team continue to assess barriers to discharge/monitor patient progress toward functional and medical goals  Care Tool:  Bathing    Body parts bathed by patient: Right arm, Left arm, Chest, Abdomen, Face   Body parts bathed by helper: Front perineal area, Buttocks, Left lower leg, Right lower leg     Bathing assist Assist Level: Moderate Assistance - Patient 50 - 74%     Upper Body Dressing/Undressing Upper body dressing   What is the patient wearing?: Pull over shirt    Upper body assist Assist Level: Moderate Assistance - Patient 50 - 74%    Lower Body Dressing/Undressing Lower body dressing      What is the patient wearing?: Pants     Lower body assist Assist for lower body dressing: Total Assistance - Patient < 25%  Toileting Toileting    Toileting assist Assist for toileting: Dependent - Patient 0% (bed pan, bed level) Assistive Device Comment: able to manage urinal with set-up assist; reports need for suppository for BM   Transfers Chair/bed transfer  Transfers assist     Chair/bed transfer assist level: Maximal Assistance - Patient 25 - 49% (slide board)     Locomotion Ambulation   Ambulation assist   Ambulation activity did not occur: Safety/medical concerns          Walk 10 feet activity   Assist  Walk 10 feet activity did not occur: Safety/medical concerns        Walk 50 feet activity   Assist Walk 50 feet with 2 turns activity did not occur: Safety/medical concerns         Walk 150 feet activity   Assist Walk 150 feet activity did  not occur: Safety/medical concerns         Walk 10 feet on uneven surface  activity   Assist Walk 10 feet on uneven surfaces activity did not occur: Safety/medical concerns         Wheelchair     Assist Is the patient using a wheelchair?: Yes Type of Wheelchair: Manual    Wheelchair assist level: Dependent - Patient 0%      Wheelchair 50 feet with 2 turns activity    Assist        Assist Level: Dependent - Patient 0%   Wheelchair 150 feet activity     Assist      Assist Level: Dependent - Patient 0%   Blood pressure (!) 155/97, pulse 86, temperature 98.1 F (36.7 C), resp. rate 18, height 5\' 8"  (1.727 m), weight 119.9 kg, SpO2 98 %.  Medical Problem List and Plan: 1.  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 05/31/2021.  TLSO back brace when out of bed. JP drain removed 10/7.2022  Con't CIR_ PT and OT- pain is biggest limiter.  2.  Impaired mobility: continue lovenox. Check vascular study             -antiplatelet therapy: N/A 3. Postoperative spinal pain: Voltaren 75 mg twice daily, Robaxin and oxycodone as needed. Added back dilaudid 1mg  q6H prn given severity of pain.   -10/9 adjust dilaudid to 2mg  q6 prn  -add tizanidine 2mg  tid for muscle spasms, continue robaxin q12 prn only  -encouraged pt to get oob as much as possible  10/11- will start ms contin 15 mg BID and stop Dliaudid and con't oxycodone- also add Cymbalta 30 mg QHS for nerve pain and mood.  10/19- pain still biggest limiter- will increase Oxycodone to 101-5 mg q4 hours prn and increase bowel meds 4. Mood: Provide emotional support  10/10- pt appears  depressed- will d/w pt in AM about starting something that could also help pain.   10/11- adding cymbalta 30 mg QHS. 10/17- will increase Cymbalta to 60 mg QHS  10/18- no side effects so far- so unlikely- will likely improve in the next few days mood and pain.              -antipsychotic agents: N/A 5. Neuropsych: This patient is capable of making decisions on his own behalf. 6. Skin/Wound Care: Routine skin  checks 7. Fluids/Electrolytes/Nutrition: Routine in and outs 8.  ID.  Transitioned from intravenous cefazolin to amoxicillin.    10/13- will check with ID when to stop Aomoxicllin- around 10/20- per chart-  call Dr Dorna Bloom or ID 9.  Acute blood loss anemia.  Latest hemoglobin 8.4.  Follow-up CBC Monday  Hemoglobin 8.5 on 10/10, labs ordered for Monday 10.  Hyponatremia.  Sodium 136 on 10/10 labs ordered for Monday 11.  Hypertension.  Continue Norvasc 5 mg daily.    -fair control so far despite pain 10/13- increased Norvasc to 10 mg daily  10/19- BP a little more elevated this AM- but hurting real bad- tomorrow, if no better, will add another BP meds  12.  Irritable bowel syndrome.  Amitiza 24 mcg twice daily 13.  History of gout.  Continue colchicine 14.  OSA.  CPAP 15.  Obesity.  Dietary follow-up 16.  Hypoalbuminemia  Supplement initiated on 10/15 17. Constipation  10/18- will give Sorbitol 30cc x1 at 4pm.   10/19- will give Sorbitol 60cc after therapy and fleet's enema if cannot go afterwards- also increase miralax to BID- already on [place]kot 2 tabs BID and Colace BID.     LOS: 12 days A FACE TO FACE EVALUATION WAS PERFORMED  Marco Kennedy 04/15/2021, 8:13 AM

## 2021-04-15 NOTE — Progress Notes (Deleted)
RT NOTE:  Pt states he will manage home CPAP machine. RT available if needed.

## 2021-04-15 NOTE — Progress Notes (Signed)
Physical Therapy Session Note  Patient Details  Name: Marco Kennedy MRN: [id number] Date of Birth: [birth year]  Today's Date: 04/15/2021 PT Individual Time: 6761-9509, 3267-1245 PT Individual Time Calculation (min): 41 min, 43 min  Short Term Goals: Week 1:  PT Short Term Goal 1 (Week 1): Pt will tolerate sitting upright x 5 min with no more than 5/10 pain PT Short Term Goal 1 - Progress (Week 1): Met PT Short Term Goal 2 (Week 1): Pt will initiate w/c mobility PT Short Term Goal 2 - Progress (Week 1): Met PT Short Term Goal 3 (Week 1): Pt will perform bed mobility with mod A consistently adhering to back precautions PT Short Term Goal 3 - Progress (Week 1): Met Week 2:  PT Short Term Goal 1 (Week 2): Pt will perform stand pivot transfer with RW PT Short Term Goal 2 (Week 2): Pt will perform STS with min A PT Short Term Goal 3 (Week 2): Pt will initiate gait training  Skilled Therapeutic Interventions/Progress Updates:    Session 2: Pt seated in w/c on arrival and agreeable to therapy. Pt reported 4/10 pain at rest, premedicated. Pt propelled w/c with BUE x 300 ft to therapy gym for endurance and functional mobility. Pt then set up in standing frame. Pt performed mini squats from sling with min-mod A at times to stand, 4 x 5. Pt reported mild knee pain, unrated and consistent with arthritic pain noted during previous admission. Pt then stood in standing frame x ~10 minutes, performing crossbody reaching to move cones from standing frame table to therapist/tech's hand to improve standing tolerance. Pt encouraged to shift weight onto L foot while reaching laterally. Pt then returned to room and remained in w/c, was left with all needs in reach and alarm active.   Session 2: Pt seated in w/c on arrival and agreeable to therapy. Pt reports baseline levels of pain, that increase with activity, unrated, but did not require additional intervention as pt was premedicated. Pt propelled w/c with BUE  x 200 ft for endurance and functional mobility. Pt performed slideboard transfer <>mat table with mod A fading to min A. Pt performed heel slides 3 x 20 for improved control and strength of BLE. Pt demoed knee valgus during heel slides with only min improvement after multimodal cueing. Pt then performed Sit to stand x 5 to RW with mod A x 2 to block knees and provide initial power up to stand. Pt then returned to room and remained in w/c, was left with all needs in reach and alarm active.   Therapy Documentation Precautions:  Precautions Precautions: Fall, Back Precaution Booklet Issued: No Precaution Comments: reviewed back precautions Required Braces or Orthoses: Spinal Brace Spinal Brace: Thoracolumbosacral orthotic, Applied in sitting position Spinal Brace Comments: TLSO when OOB only Restrictions Weight Bearing Restrictions: No General:      Therapy/Group: Individual Therapy  Mickel Fuchs 04/15/2021, 12:11 PM

## 2021-04-16 NOTE — Progress Notes (Signed)
Occupational Therapy Session Note  Patient Details  Name: Marco Kennedy MRN: [id number] Date of Birth: 06-13-1951  Today's Date: 04/17/2021 OT Individual Time: 1003-1058 OT Individual Time Calculation (min): 55 min   Short Term Goals: Week 2:  OT Short Term Goal 1 (Week 2): pt will don shirt/TLSO Min A at EOB or up in w/c OT Short Term Goal 2 (Week 2): Pt will perform slide board transfer with Min A in prep for ADL OT Short Term Goal 3 (Week 2): Patient will don LB clothing at bed level with Mod A of 1 helper and AE PRN. OT Short Term Goal 4 (Week 2): Pt will perform sit <> stands in prep for ADL at [facility] with  Mod A of 1  Skilled Therapeutic Interventions/Progress Updates:    Pt greeted in the w/c and premedicated for pain. Already wearing his TLSO. Started with oral care, shaving, and UB bathing/dressing while sitting at the sink. Noted improvement in pts ability to weight shift forward to reach for needed items. Min A for shaving for thoroughness. Pt able to doff his TLSO with cues and then don it again after doffing/donning shirt with supervision. We discussed plans for home with pt reporting that his bathroom is not w/c accessible. For remainder of session, worked on B LE strengthening via towel slides, x20 reps 2 sets. Pt required more assistance for mobility of the Lt vs Rt LE. He remained sitting up at close of session, all needs within reach.    Therapy Documentation Precautions:  Precautions Precautions: Fall, Back Precaution Booklet Issued: No Precaution Comments: reviewed back precautions Required Braces or Orthoses: Spinal Brace Spinal Brace: Thoracolumbosacral orthotic, Applied in sitting position Spinal Brace Comments: TLSO when OOB only Restrictions Weight Bearing Restrictions: No Vital Signs: Therapy Vitals Temp: 98.1 F (36.7 C) Temp Source: Oral Pulse Rate: 87 Resp: 18 BP: 117/66 Patient Position (if appropriate): Sitting Oxygen Therapy SpO2: 98 % O2  Device: Room Air ADL: ADL Eating: Set up Grooming: Setup Where Assessed-Grooming: Edge of bed (sitting EOB with Min to Mod A to maintain static sitting balance) Upper Body Bathing: Moderate assistance Where Assessed-Upper Body Bathing: Bed level Lower Body Bathing: Dependent Where Assessed-Lower Body Bathing: Bed level Upper Body Dressing: Maximal assistance Where Assessed-Upper Body Dressing: Edge of bed Lower Body Dressing: Dependent Where Assessed-Lower Body Dressing: Bed level Toileting: Setup Where Assessed-Toileting: Other (Comment) (Urinal) Toilet Transfer Method: Unable to assess Tub/Shower Transfer: Unable to assess   Therapy/Group: Individual Therapy  Marco Kennedy 04/17/2021, 4:48 PM

## 2021-04-16 NOTE — Progress Notes (Signed)
Physical Therapy Session Note  Patient Details  Name: Marco Kennedy MRN: [id number] Date of Birth: 12/23/1950  Today's Date: 04/16/2021 PT Individual Time: 1300-1355 PT Individual Time Calculation (min): 55 min   Short Term Goals: Week 1:  PT Short Term Goal 1 (Week 1): Pt will tolerate sitting upright x 5 min with no more than 5/10 pain PT Short Term Goal 1 - Progress (Week 1): Met PT Short Term Goal 2 (Week 1): Pt will initiate w/c mobility PT Short Term Goal 2 - Progress (Week 1): Met PT Short Term Goal 3 (Week 1): Pt will perform bed mobility with mod A consistently adhering to back precautions PT Short Term Goal 3 - Progress (Week 1): Met  Skilled Therapeutic Interventions/Progress Updates:    Pt seated in w/c on arrival and agreeable to therapy. Pt reported 3/10 pain, nsg present to administer pain meds at start of session. Pt propelled w/c with BUE to day room for endurance and functional mobility. slideboard transfer <>mat table with min A fading to CGA, VC for trunk positioning and LE placement at times, assist to position LLE. Pt then directed in Sit to stand to RW x 7 throughout session with knee block and assist to power up from behind, as little as mod A from each person at times. Pt performed 3 x 5 squats in standing with assist at knees to prevent buckling and seated rest break between attempts. Despite fatigue, pt demoed improved control from previous sessions. Pt performed 1 bout of marching, progressing to attempting side stepping but unable to step laterally with LLE. Pt directed in pre gait activity, stepping forward and back with each LE. Pt reported he felt his leg was "out of control," which is why he stated he needed to rest each time before beginning to show signs of fatigue. Pt then performed seated therex, marches and lifting LE up in march, extending knee, and sliding back for HS curl, BIL. Pt returned to room and remained in w/c, was left with all needs in reach and  alarm active.   Therapy Documentation Precautions:  Precautions Precautions: Fall, Back Precaution Booklet Issued: No Precaution Comments: reviewed back precautions Required Braces or Orthoses: Spinal Brace Spinal Brace: Thoracolumbosacral orthotic, Applied in sitting position Spinal Brace Comments: TLSO when OOB only Restrictions Weight Bearing Restrictions: No     Therapy/Group: Individual Therapy  Mickel Fuchs 04/16/2021, 4:37 PM

## 2021-04-16 NOTE — Progress Notes (Signed)
Physical Therapy Session Note  Patient Details  Name: Marco Kennedy MRN: [id number] Date of Birth: 10/03/1950  Today's Date: 04/16/2021 PT Individual Time: 0900-0930 PT Individual Time Calculation (min): 30 min   Short Term Goals: Week 2:  PT Short Term Goal 1 (Week 2): Pt will perform stand pivot transfer with RW PT Short Term Goal 2 (Week 2): Pt will perform STS with min A PT Short Term Goal 3 (Week 2): Pt will initiate gait training  Skilled Therapeutic Interventions/Progress Updates: Pt presented in bed with nsg present administering am meds. Pt states pain 3/10 but acknowledges that tolerable at rest and goes up significantly with movement. Once meds received pt participated in heel slides, hip abd/add, manually resisted leg press, and glute sets x 10 bilaterally. Pt then was able to roll to R and PTA performed manual trigger point release to L paraspinals and QL. Pt rolled back to supine once completed and repositioned to comfort. Pt left in bed at end of session with bed alarm on, call bell within reach and needs met.      Therapy Documentation Precautions:  Precautions Precautions: Fall, Back Precaution Booklet Issued: No Precaution Comments: reviewed back precautions Required Braces or Orthoses: Spinal Brace Spinal Brace: Thoracolumbosacral orthotic, Applied in sitting position Spinal Brace Comments: TLSO when OOB only Restrictions Weight Bearing Restrictions: No General:   Vital Signs: Therapy Vitals Temp: 98.7 F (37.1 C) Temp Source: Oral Pulse Rate: 74 Resp: 17 BP: 118/63 Patient Position (if appropriate): Sitting Oxygen Therapy SpO2: 98 % O2 Device: Room Air Pain: Pain Assessment Pain Score: 5  Mobility:   Locomotion :    Trunk/Postural Assessment :    Balance:   Exercises:   Other Treatments:      Therapy/Group: Individual Therapy  Elsa Ploch 04/16/2021, 4:31 PM

## 2021-04-16 NOTE — Progress Notes (Signed)
Occupational Therapy Session Note  Patient Details  Name: Marco Kennedy MRN: [id number] Date of Birth: Apr 15, 1951  Today's Date: 04/16/2021 OT Individual Time: 0630-1601 and 1415-1456 OT Individual Time Calculation (min): 72 min and 41 min   Short Term Goals: Week 1:  OT Short Term Goal 1 (Week 1): Patient will don UB clothing seated EOB with Min A. OT Short Term Goal 1 - Progress (Week 1): Progressing toward goal OT Short Term Goal 2 (Week 1): Patient will complete squat-pivot transfers with Mod A +2. OT Short Term Goal 2 - Progress (Week 1): Progressing toward goal OT Short Term Goal 3 (Week 1): Patient will don LB clothing at bed level with Mod A of 1 helper and AD PRN. OT Short Term Goal 3 - Progress (Week 1): Progressing toward goal Week 2:  OT Short Term Goal 1 (Week 2): pt will don shirt/TLSO Min A at EOB or up in w/c OT Short Term Goal 2 (Week 2): Pt will perform slide board transfer with Min A in prep for ADL OT Short Term Goal 3 (Week 2): Patient will don LB clothing at bed level with Mod A of 1 helper and AE PRN. OT Short Term Goal 4 (Week 2): Pt will perform sit <> stands in prep for ADL at [place] with  Mod A of 1   Skilled Therapeutic Interventions/Progress Updates:    Session 1: Pt greeted at time of session semireclined in bed agreeable to OT session, no pain resting but significant back pain with bed mobility and slide board transfer. Pt did not rate pain, but did improve with rest and positional change, premedicated per pt. Bed level LB bathe and dress with Max/total A, pt able to wash periarea with HOB elevated. Supine > sit 2 helpers 2/2 posterior bias and pain. Extended time sitting EOB prior to mobility to orient to being upright and donned TLSO with Max A. Total A slide board placement and scooting bed > w/c with Mod A of 1 and second helper for CGA for safety. Up in chair self propel > sink and performed oral hygiene, UB bathe/dress, and shaving. Pt with Min A to  thoroughly wash back and fully get down shirt in the back. Transported to gym for time conservation, 1x15 of the following using a 1 kg ball at rebounder: ball toss, ball toss + chest press, ball toss + mild overhead press with cues to lean off back rest. Back in room, alarm on call bell in reach.   Session 2: Pt greeted at time of session sitting up in wheelchair agreeable to OT session, no significant pain at this time. Self propel > main gym Supervision and extended time, difficulty fully extending shoulders for propelling 2/2 wheelchair. Focused on sit <> stands at [facility] with bilateral knee block and 2 helpers to bring up to standing, physical assist for hip extension and shoulders back, able to stand for 10, 15, and 20 seconds each round. Difficulty sequencing hand > RW coordination needing max verbal cues for timing. Remainder of session on UB and core strength with 2# dowel therapy ball hits. Back in room set up alarm on call bell in reach all needs met.   Therapy Documentation Precautions:  Precautions Precautions: Fall, Back Precaution Booklet Issued: No Precaution Comments: reviewed back precautions Required Braces or Orthoses: Spinal Brace Spinal Brace: Thoracolumbosacral orthotic, Applied in sitting position Spinal Brace Comments: TLSO when OOB only Restrictions Weight Bearing Restrictions: No    Therapy/Group: Individual Therapy  Viona Gilmore 04/16/2021, 7:24 AM

## 2021-04-16 NOTE — Progress Notes (Signed)
PROGRESS NOTE   Subjective/Complaints:  Pain is still biggest limiter- increase in Oxycodone yesterday- pt thinks it's improved pain by ~ 10%- helped "some"-  Admits did a lot of standing yesterday, but in standing frame-  Knows w/c won't fit in bathrooms at home- concerned might not fit in master bedroom.   LBM x2 last night- feels much better.    ROS:   Pt denies SOB, abd pain, CP, N/V/C/D, and vision changes  Objective:   No results found. Recent Labs    04/13/21 0917  WBC 8.3  HGB 9.3*  HCT 29.7*  PLT 360    Recent Labs    04/13/21 0917  NA 136  K 3.7  CL 99  CO2 30  GLUCOSE 146*  BUN 14  CREATININE 0.72  CALCIUM 8.5*     Intake/Output Summary (Last 24 hours) at 04/16/2021 4128 Last data filed at 04/16/2021 0700 Gross per 24 hour  Intake 480 ml  Output 300 ml  Net 180 ml     Pressure Injury 04/10/21 Buttocks Left;Posterior;Proximal Stage 1 -  Intact skin with non-blanchable redness of a localized area usually over a bony prominence. (Active)  04/10/21   Location: Buttocks  Location Orientation: Left;Posterior;Proximal  Staging: Stage 1 -  Intact skin with non-blanchable redness of a localized area usually over a bony prominence.  Wound Description (Comments):   Present on Admission:     Physical Exam: Vital Signs Blood pressure 134/74, pulse 69, temperature 97.9 F (36.6 C), resp. rate 18, height 5\' 8"  (1.727 m), weight 119.9 kg, SpO2 98 %.       General: awake, alert, appropriate, sitting up in bed; NAD HENT: conjugate gaze; oropharynx moist CV: regular rate; no JVD Pulmonary: CTA B/L; no W/R/R- good air movement GI: soft, NT, ND, (+)BS Psychiatric: appropriate- laughing some Neurological: Ox3 Skin: Warm and dry.  Stage I sacral ulcer left buttock. Musc: Very TTP over thoracic and lumbar paraspinals B/L - no change Neuro: Alert and oriented Motor: Bilateral upper extremities:  5/5 proximal distal Bilateral lower extremities: 4/5 proximal distal   Assessment/Plan: 1. Functional deficits which require 3+ hours per day of interdisciplinary therapy in a comprehensive inpatient rehab setting. Physiatrist is providing close team supervision and 24 hour management of active medical problems listed below. Physiatrist and rehab team continue to assess barriers to discharge/monitor patient progress toward functional and medical goals  Care Tool:  Bathing    Body parts bathed by patient: Right arm, Left arm, Chest, Abdomen, Face   Body parts bathed by helper: Front perineal area, Buttocks, Left lower leg, Right lower leg     Bathing assist Assist Level: Moderate Assistance - Patient 50 - 74%     Upper Body Dressing/Undressing Upper body dressing   What is the patient wearing?: Pull over shirt, Orthosis    Upper body assist Assist Level: Maximal Assistance - Patient 25 - 49%    Lower Body Dressing/Undressing Lower body dressing      What is the patient wearing?: Pants     Lower body assist Assist for lower body dressing: Total Assistance - Patient < 25%     Toileting Toileting  Toileting assist Assist for toileting: Dependent - Patient 0% (bed pan, bed level) Assistive Device Comment: able to manage urinal with set-up assist; reports need for suppository for BM   Transfers Chair/bed transfer  Transfers assist     Chair/bed transfer assist level: Moderate Assistance - Patient 50 - 74%     Locomotion Ambulation   Ambulation assist   Ambulation activity did not occur: Safety/medical concerns          Walk 10 feet activity   Assist  Walk 10 feet activity did not occur: Safety/medical concerns        Walk 50 feet activity   Assist Walk 50 feet with 2 turns activity did not occur: Safety/medical concerns         Walk 150 feet activity   Assist Walk 150 feet activity did not occur: Safety/medical concerns          Walk 10 feet on uneven surface  activity   Assist Walk 10 feet on uneven surfaces activity did not occur: Safety/medical concerns         Wheelchair     Assist Is the patient using a wheelchair?: Yes Type of Wheelchair: Manual    Wheelchair assist level: Dependent - Patient 0%      Wheelchair 50 feet with 2 turns activity    Assist        Assist Level: Dependent - Patient 0%   Wheelchair 150 feet activity     Assist      Assist Level: Dependent - Patient 0%   Blood pressure 134/74, pulse 69, temperature 97.9 F (36.6 C), resp. rate 18, height 5\' 8"  (1.727 m), weight 119.9 kg, SpO2 98 %.  Medical Problem List and Plan: 1.  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 05/31/2021.  TLSO back brace when out of bed. JP drain removed 10/7.2022  Con't PT/OT-CIR- pain is biggest limiter- d/w pt about my concerns that we need to spend more time focusing on transfers and less on standing/walking- because he has no SNF benefits- has to go home, but wife cannot help all the time.  2.  Impaired mobility: continue lovenox. Check vascular study             -antiplatelet therapy: N/A 3. Postoperative spinal pain: Voltaren 75 mg twice daily, Robaxin and oxycodone as needed. Added back dilaudid 1mg  q6H prn given severity of pain.   10/11- will start ms contin 15 mg BID and stop Dliaudid and con't oxycodone- also add Cymbalta 30 mg QHS for nerve pain and mood.  10/20- increas ein Oxy to 10-15 mg q4 hours helped ~ 10%- if things not better today- will increase long acting tomorrow- d/w pt.  4. Mood: Provide emotional support  10/10- pt appears  depressed- will d/w pt in AM about starting something that could also help pain.   10/11- adding cymbalta 30 mg QHS. 10/17- will increase Cymbalta to 60 mg QHS  10/18- no side effects so far- so unlikely- will likely improve in the next few days mood and pain.              -antipsychotic agents: N/A 5. Neuropsych: This patient is capable of making decisions on his own behalf. 6. Skin/Wound Care: Routine  skin checks 7. Fluids/Electrolytes/Nutrition: Routine in and outs 8.  ID.  Transitioned from intravenous cefazolin to amoxicillin.    10/13- will check with ID when to stop Aomoxicllin- around 10/20- per chart-  call Dr Dorna Bloom or ID 9.  Acute blood loss anemia.  Latest hemoglobin 8.4.  Follow-up CBC Monday  Hemoglobin 8.5 on 10/10, labs ordered for Monday 10.  Hyponatremia.  Sodium 136 on 10/10 labs ordered for Monday 11.  Hypertension.  Continue Norvasc 5 mg daily.    -fair control so far despite pain 10/13- increased Norvasc to 10 mg daily  10/20- BP looks great today/last 24 hours- con't regimen 12.  Irritable bowel syndrome.  Amitiza 24 mcg twice daily 13.  History of gout.  Continue colchicine 14.  OSA.  CPAP 15.  Obesity.  Dietary follow-up 16.  Hypoalbuminemia  Supplement initiated on 10/15 17. Constipation  10/18- will give Sorbitol 30cc x1 at 4pm.   10/19- will give Sorbitol 60cc after therapy and fleet's enema if cannot go afterwards- also increase miralax to BID- already on [place] 2 tabs BID and Colace BID.   10/20- had 2 Bms- con't to monitor  I spent a total of 40 minutes on total care- >50% coordination of care- prolonged d/w pt about goals and need to focus on transfers- and sensation doesn't have a way to "fix it"- lack of sensation- will see over time if will improve-  Also need wife to measure doorways to bedrooms. Also d/w therapy about goals.     LOS: 13 days A FACE TO FACE EVALUATION WAS PERFORMED  Marco Kennedy 04/16/2021, 9:06 AM

## 2021-04-17 ENCOUNTER — Other Ambulatory Visit: Payer: Self-pay

## 2021-04-17 DIAGNOSIS — K5903 Drug induced constipation: Secondary | ICD-10-CM

## 2021-04-17 NOTE — Progress Notes (Signed)
PROGRESS NOTE   Subjective/Complaints: Patient seen lying in bed this morning.  He states he slept very well last night, using the CPAP.  He denies complaints.  ROS: Denies CP, SOB, N/V/D  Objective:   No results found. No results for input(s): WBC, HGB, HCT, PLT in the last 72 hours.   No results for input(s): NA, K, CL, CO2, GLUCOSE, BUN, CREATININE, CALCIUM in the last 72 hours.    Intake/Output Summary (Last 24 hours) at 04/17/2021 1041 Last data filed at 04/17/2021 0900 Gross per 24 hour  Intake 600 ml  Output 700 ml  Net -100 ml      Pressure Injury 04/10/21 Buttocks Left;Posterior;Proximal Stage 1 -  Intact skin with non-blanchable redness of a localized area usually over a bony prominence. (Active)  04/10/21   Location: Buttocks  Location Orientation: Left;Posterior;Proximal  Staging: Stage 1 -  Intact skin with non-blanchable redness of a localized area usually over a bony prominence.  Wound Description (Comments):   Present on Admission:     Physical Exam: Vital Signs Blood pressure (!) 142/78, pulse 62, temperature 98.4 F (36.9 C), temperature source Oral, resp. rate 18, height 5\' 8"  (1.727 m), weight 119.9 kg, SpO2 98 %. Constitutional: No distress . Vital signs reviewed.  Obese. HENT: Normocephalic.  Atraumatic. Eyes: EOMI. No discharge. Cardiovascular: No JVD.  RRR. Respiratory: Normal effort.  No stridor.  Bilateral clear to auscultation. GI: Non-distended.  BS +. Skin: Warm and dry.  Sacrum not examined today. Psych: Normal mood.  Normal behavior. Musc: Lower extremity edema.  No tenderness in extremities. Neuro: Alert Motor: Bilateral upper extremities: 5/5 proximal distal Bilateral lower extremities: 4/5 proximal distal, stable  Assessment/Plan: 1. Functional deficits which require 3+ hours per day of interdisciplinary therapy in a comprehensive inpatient rehab setting. Physiatrist is  providing close team supervision and 24 hour management of active medical problems listed below. Physiatrist and rehab team continue to assess barriers to discharge/monitor patient progress toward functional and medical goals  Care Tool:  Bathing    Body parts bathed by patient: Right arm, Left arm, Chest, Abdomen, Face   Body parts bathed by helper: Front perineal area, Buttocks, Left lower leg, Right lower leg     Bathing assist Assist Level: Moderate Assistance - Patient 50 - 74%     Upper Body Dressing/Undressing Upper body dressing   What is the patient wearing?: Pull over shirt, Orthosis    Upper body assist Assist Level: Maximal Assistance - Patient 25 - 49%    Lower Body Dressing/Undressing Lower body dressing      What is the patient wearing?: Pants     Lower body assist Assist for lower body dressing: Total Assistance - Patient < 25%     Toileting Toileting    Toileting assist Assist for toileting: Dependent - Patient 0% (bed pan, bed level) Assistive Device Comment: able to manage urinal with set-up assist; reports need for suppository for BM   Transfers Chair/bed transfer  Transfers assist     Chair/bed transfer assist level: Moderate Assistance - Patient 50 - 74%     Locomotion Ambulation   Ambulation assist   Ambulation activity did not  occur: Safety/medical concerns          Walk 10 feet activity   Assist  Walk 10 feet activity did not occur: Safety/medical concerns        Walk 50 feet activity   Assist Walk 50 feet with 2 turns activity did not occur: Safety/medical concerns         Walk 150 feet activity   Assist Walk 150 feet activity did not occur: Safety/medical concerns         Walk 10 feet on uneven surface  activity   Assist Walk 10 feet on uneven surfaces activity did not occur: Safety/medical concerns         Wheelchair     Assist Is the patient using a wheelchair?: Yes Type of Wheelchair:  Manual    Wheelchair assist level: Dependent - Patient 0%      Wheelchair 50 feet with 2 turns activity    Assist        Assist Level: Dependent - Patient 0%   Wheelchair 150 feet activity     Assist      Assist Level: Dependent - Patient 0%   Blood pressure (!) 142/78, pulse 62, temperature 98.4 F (36.9 C), temperature source Oral, resp. rate 18, height 5\' 8"  (1.727 m), weight 119.9 kg, SpO2 98 %.  Medical Problem List and Plan: 1.  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 05/31/2021.  TLSO back brace when out of bed. JP drain removed 10/7.2022  Continue CIR 2.  Impaired mobility: continue lovenox.              -antiplatelet therapy: N/A 3. Postoperative spinal pain: Voltaren 75 mg twice daily, Robaxin and oxycodone as needed. Added back dilaudid 1mg  q6H prn given severity of pain.   10/11-started ms contin 15 mg BID and stop Dliaudid and con't oxycodone- also add Cymbalta 30 mg QHS for nerve pain and mood.  10/20- increased Oxy to 10-15 mg q4 hours helped  Controlled with meds on 10/21 4. Mood: Provide emotional support  10/10- pt appears depressed- will d/w pt in AM about starting something that could also help pain.   10/11-added cymbalta 30 mg QHS. 10/17-increased Cymbalta to 60 mg QHS             -antipsychotic agents: N/A 5. Neuropsych: This patient is capable of making decisions on his own behalf. 6. Skin/Wound Care: Routine skin checks 7. Fluids/Electrolytes/Nutrition: Routine in and outs 8.  ID.  Transitioned from intravenous cefazolin to amoxicillin.    Check with ID when to stop Aomoxicllin- ~ 10/20- per chart-  call Dr 11/21 or  ID 9.  Acute blood loss anemia.  Latest hemoglobin 8.4.  Follow-up CBC Monday  Hemoglobin 9.3 on 10/17 10.  Hyponatremia.  Sodium 136 on 10/10 labs ordered for Monday 11.  Hypertension.  Continue Norvasc 5 mg daily.   10/13- increased Norvasc to 10 mg daily  Controlled on 10/21 12.  Irritable bowel syndrome.  Amitiza 24 mcg twice daily 13.  History of gout.  Continue colchicine 14.  OSA.  CPAP 15.  Obesity.  Dietary follow-up 16.  Hypoalbuminemia  Supplement initiated on 10/15 17.  Drug-induced constipation Improving  LOS: 14 days A FACE TO FACE EVALUATION  WAS PERFORMED  Khala Tarte Karis Juba 04/17/2021, 10:41 AM

## 2021-04-17 NOTE — Progress Notes (Signed)
Physical Therapy Session Note  Patient Details  Name: Marco Kennedy MRN: [id number] Date of Birth: 1951-05-29  Today's Date: 04/17/2021 PT Individual Time: 1116-1200 PT Individual Time Calculation (min): 44 min   Short Term Goals: Week 2:  PT Short Term Goal 1 (Week 2): Pt will perform stand pivot transfer with RW PT Short Term Goal 2 (Week 2): Pt will perform STS with min A PT Short Term Goal 3 (Week 2): Pt will initiate gait training  Skilled Therapeutic Interventions/Progress Updates: Pyt presents sitting up in w/c and agreeable to therapy.  Pt wheeled self x >200' w/ supervision.  Pt performed SB transfer w/c > mat table w/ mod A on upward bias w/ cueing for forward lean and manual A for re-positioning LEs.  Pt required total A for placing SB, can lean away.  Pt performed multiple STS and attempted STS from elevated mat table and pillow between knees and blocking B knees from front and Max A.  Pt able to stand x 15 seconds.  Pt transferred SB mat table > w/c w/ CGA to min A downward bias.  Pt returned to room x 200' and remained in w/c w/ chair alarm on and all needs in place.     Therapy Documentation Precautions:  Precautions Precautions: Fall, Back Precaution Booklet Issued: No Precaution Comments: reviewed back precautions Required Braces or Orthoses: Spinal Brace Spinal Brace: Thoracolumbosacral orthotic, Applied in sitting position Spinal Brace Comments: TLSO when OOB only Restrictions Weight Bearing Restrictions: No General:   Vital Signs: Therapy Vitals Temp: 98.4 F (36.9 C) Temp Source: Oral Pulse Rate: 62 Resp: 18 BP: (!) 142/78 Patient Position (if appropriate): Sitting Pain:3/10 initially, 7/10 w activity Pain Assessment Pain Scale: 0-10 Pain Score: 5  Faces Pain Scale: Hurts little more Pain Type: Acute pain Pain Location: Back Pain Orientation: Mid Pain Descriptors / Indicators: Aching;Tender;Restless;Discomfort Pain Onset: Gradual Patients  Stated Pain Goal: 2 Pain Intervention(s): Medication (See eMAR);Pain med given for lower pain score than stated, per patient request;Relaxation;Rest Multiple Pain Sites: No 2nd Pain Site Pain Score: 3 Pain Type: Intractable pain Pain Location: Other (Comment) Pain Orientation:  (bilateral lower legs) Pain Intervention(s): Refused (Stated ' OT was going to get him up".)     Therapy/Group: Individual Therapy  Lucio Edward 04/17/2021, 12:26 PM

## 2021-04-17 NOTE — Progress Notes (Signed)
Physical Therapy Session Note  Patient Details  Name: Marco Kennedy MRN: [id number] Date of Birth: 04/14/1951  Today's Date: 04/17/2021 PT Individual Time: 1658-0063 PT Individual Time Calculation (min): 72 min   Short Term Goals: Week 1:  PT Short Term Goal 1 (Week 1): Pt will tolerate sitting upright x 5 min with no more than 5/10 pain PT Short Term Goal 1 - Progress (Week 1): Met PT Short Term Goal 2 (Week 1): Pt will initiate w/c mobility PT Short Term Goal 2 - Progress (Week 1): Met PT Short Term Goal 3 (Week 1): Pt will perform bed mobility with mod A consistently adhering to back precautions PT Short Term Goal 3 - Progress (Week 1): Met Week 2:  PT Short Term Goal 1 (Week 2): Pt will perform stand pivot transfer with RW PT Short Term Goal 2 (Week 2): Pt will perform STS with min A PT Short Term Goal 3 (Week 2): Pt will initiate gait training  Skilled Therapeutic Interventions/Progress Updates:    Pt seated in w/c on arrival and agreeable to therapy. Pt reports that his pain is "doing ok" and that he was premedicated. Pt propelled w/c with BUE to day room for endurance and functional mobility. Pt reported some stiffness with initial mobility, so was directed in using kinetron to improve joint mobility and increase cardiovascular endurance at 50 cm/sec 4 x 2 min with 1 min rest breaks between, pt reported 7/10 RPE. Pt then performed slideboard transfer <> mat table with CGA, guarding at knees to prevent anterior slide. Pt directed in Sit to stand from w/c with mod A x 2. Attempted stand pivot transfer but pt was unable to move feet. Pt then performed Sit to stand with same assist from mat table and squats 2 x 5, 1 x 8 for LE strength and control. Pt then directed in sit ups on physioball, 2 x 10. Pt returned to room after session and was left in w/c with all needs in reach and alarm active.   Therapy Documentation Precautions:  Precautions Precautions: Fall, Back Precaution Booklet  Issued: No Precaution Comments: reviewed back precautions Required Braces or Orthoses: Spinal Brace Spinal Brace: Thoracolumbosacral orthotic, Applied in sitting position Spinal Brace Comments: TLSO when OOB only Restrictions Weight Bearing Restrictions: No General:     Therapy/Group: Individual Therapy  Mickel Fuchs 04/17/2021, 1:15 PM

## 2021-04-17 NOTE — Progress Notes (Signed)
Occupational Therapy Session Note  Patient Details  Name: Marco Kennedy MRN: [id number] Date of Birth: 1950-08-08  Today's Date: 04/17/2021 OT Individual Time: 5176-1607 OT Individual Time Calculation (min): 24 min    Short Term Goals: Week 2:  OT Short Term Goal 1 (Week 2): pt will don shirt/TLSO Min A at EOB or up in w/c OT Short Term Goal 2 (Week 2): Pt will perform slide board transfer with Min A in prep for ADL OT Short Term Goal 3 (Week 2): Patient will don LB clothing at bed level with Mod A of 1 helper and AE PRN. OT Short Term Goal 4 (Week 2): Pt will perform sit <> stands in prep for ADL at LRAD with  Mod A of 1  Skilled Therapeutic Interventions/Progress Updates:    Pt in bed to start session agreeable to working on donning LB clothing and transferring out of the bed.  He was able to roll side to side in the bed with min to mod assist.  Reacher was utilized in supine with HOB up slightly for doffing gripper socks and donning shorts.  Therapist had to provide mod facilitation to flex each LE and hold it up for him to complete these tasks.  Mod assist for rolling side to side to pull the shorts over his hips.  Total assist for donning shoes already tied before transition to the be.  Max assist was provided for transition from right sidelying to sitting.  Pt reports increased pain in his back in sitting, but less than previous days.  Total assist for donning TLSO in sitting with max assist sliding board transfer to the wheelchair to complete session.  He was left with the call button and phone in reach with safety belt in place.    Therapy Documentation Precautions:  Precautions Precautions: Fall, Back Precaution Booklet Issued: No Precaution Comments: reviewed back precautions Required Braces or Orthoses: Spinal Brace Spinal Brace: Thoracolumbosacral orthotic, Applied in sitting position Spinal Brace Comments: TLSO when OOB only Restrictions Weight Bearing Restrictions:  No  Pain: Pain Assessment Pain Scale: 0-10 Pain Score: 5  Faces Pain Scale: Hurts little more Pain Type: Acute pain Pain Location: Back Pain Orientation: Mid Pain Descriptors / Indicators: Aching;Tender;Restless;Discomfort Pain Onset: Gradual Patients Stated Pain Goal: 2 Pain Intervention(s): Medication (See eMAR);Pain med given for lower pain score than stated, per patient request;Relaxation;Rest Multiple Pain Sites: No 2nd Pain Site Pain Score: 3 Pain Type: Intractable pain Pain Location: Other (Comment) Pain Orientation:  (bilateral lower legs) Pain Intervention(s): Refused (Stated ' OT was going to get him up".) ADL: See Care Tool Section for some details of mobility and selfcare   Therapy/Group: Individual Therapy  Mackie Goon OTR/L 04/17/2021, 12:05 PM

## 2021-04-17 NOTE — Progress Notes (Signed)
RT note. Patient placed on auto titrate cpap 16/4 with 1L bled in line. RT will continue to monitor

## 2021-04-18 NOTE — Progress Notes (Signed)
PROGRESS NOTE   Subjective/Complaints: No new complaints Asks if he will be having therapy tomorrow, he would like to Tolerating CPAP  ROS: Denies CP, SOB, N/V/D  Objective:   No results found. No results for input(s): WBC, HGB, HCT, PLT in the last 72 hours.   No results for input(s): NA, K, CL, CO2, GLUCOSE, BUN, CREATININE, CALCIUM in the last 72 hours.    Intake/Output Summary (Last 24 hours) at 04/18/2021 1919 Last data filed at 04/18/2021 1300 Gross per 24 hour  Intake 540 ml  Output 870 ml  Net -330 ml     Pressure Injury 04/10/21 Buttocks Left;Posterior;Proximal Stage 1 -  Intact skin with non-blanchable redness of a localized area usually over a bony prominence. (Active)  04/10/21   Location: Buttocks  Location Orientation: Left;Posterior;Proximal  Staging: Stage 1 -  Intact skin with non-blanchable redness of a localized area usually over a bony prominence.  Wound Description (Comments):   Present on Admission:     Physical Exam: Vital Signs Blood pressure 118/67, pulse 78, temperature 98.4 F (36.9 C), resp. rate 16, height 5\' 8"  (1.727 m), weight 112.1 kg, SpO2 99 %. Gen: no distress, normal appearing HEENT: oral mucosa pink and moist, NCAT Cardio: Reg rate Chest: normal effort, normal rate of breathing Abd: soft, non-distended Ext: no edema Psych: pleasant, normal affect Skin: Warm and dry.  Sacrum not examined today. Psych: Normal mood.  Normal behavior. Musc: Lower extremity edema.  No tenderness in extremities. Neuro: Alert Motor: Bilateral upper extremities: 5/5 proximal distal Bilateral lower extremities: 4/5 proximal distal, stable  Assessment/Plan: 1. Functional deficits which require 3+ hours per day of interdisciplinary therapy in a comprehensive inpatient rehab setting. Physiatrist is providing close team supervision and 24 hour management of active medical problems listed  below. Physiatrist and rehab team continue to assess barriers to discharge/monitor patient progress toward functional and medical goals  Care Tool:  Bathing    Body parts bathed by patient: Right arm, Left arm, Chest, Abdomen, Face   Body parts bathed by helper: Front perineal area, Buttocks, Left lower leg, Right lower leg     Bathing assist Assist Level: Moderate Assistance - Patient 50 - 74%     Upper Body Dressing/Undressing Upper body dressing   What is the patient wearing?: Pull over shirt, Orthosis    Upper body assist Assist Level: Maximal Assistance - Patient 25 - 49%    Lower Body Dressing/Undressing Lower body dressing      What is the patient wearing?: Pants     Lower body assist Assist for lower body dressing: Total Assistance - Patient < 25%     Toileting Toileting    Toileting assist Assist for toileting: Dependent - Patient 0% (bed pan, bed level) Assistive Device Comment: able to manage urinal with set-up assist; reports need for suppository for BM   Transfers Chair/bed transfer  Transfers assist     Chair/bed transfer assist level: Moderate Assistance - Patient 50 - 74%     Locomotion Ambulation   Ambulation assist   Ambulation activity did not occur: Safety/medical concerns          Walk 10 feet activity  Assist  Walk 10 feet activity did not occur: Safety/medical concerns        Walk 50 feet activity   Assist Walk 50 feet with 2 turns activity did not occur: Safety/medical concerns         Walk 150 feet activity   Assist Walk 150 feet activity did not occur: Safety/medical concerns         Walk 10 feet on uneven surface  activity   Assist Walk 10 feet on uneven surfaces activity did not occur: Safety/medical concerns         Wheelchair     Assist Is the patient using a wheelchair?: Yes Type of Wheelchair: Manual    Wheelchair assist level: Supervision/Verbal cueing Max wheelchair distance:  250 ft    Wheelchair 50 feet with 2 turns activity    Assist        Assist Level: Supervision/Verbal cueing   Wheelchair 150 feet activity     Assist      Assist Level: Supervision/Verbal cueing   Blood pressure 118/67, pulse 78, temperature 98.4 F (36.9 C), resp. rate 16, height 5\' 8"  (1.727 m), weight 112.1 kg, SpO2 99 %.  Medical Problem List and Plan: 1.  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 05/31/2021.  TLSO back brace when out of bed. JP drain removed 10/7.2022  Continue CIR 2.  Impaired mobility: continue lovenox.              -antiplatelet therapy: N/A 3. Postoperative spinal pain: Continue Voltaren 75 mg twice daily, Robaxin and oxycodone as needed. Added back dilaudid 1mg  q6H prn given severity of pain.   10/11-started ms contin 15 mg BID and stop Dliaudid and con't oxycodone- also add Cymbalta 30 mg QHS for nerve pain and mood.  10/20- increased Oxy to 10-15 mg q4 hours helped  Controlled with meds on 10/22 4. Mood: Provide emotional support  10/10- pt appears depressed- will d/w pt in AM about starting something that could also help pain.   10/11-added cymbalta 30 mg QHS. 10/17-increased Cymbalta to 60 mg QHS             -antipsychotic agents: N/A 5. Neuropsych: This patient is capable of making decisions on his own behalf. 6. Skin/Wound Care: Routine skin checks 7. Fluids/Electrolytes/Nutrition: Routine in and outs 8.  ID.  Transitioned from intravenous cefazolin to amoxicillin.    Check with ID when to stop Aomoxicllin- ~ 10/20- per chart-  call Dr 11/22 or ID 9.  Acute blood loss anemia.  Latest hemoglobin 8.4.  Follow-up CBC  Monday  Hemoglobin 9.3 on 10/17 10.  Hyponatremia.  Sodium 136 on 10/10 labs ordered for Monday 11.  Hypertension.   Continue Norvasc to 10 mg daily  Controlled on 10/212 12.  Irritable bowel syndrome.  Amitiza 24 mcg twice daily 13.  History of gout.  Continue colchicine 14.  OSA.  CPAP 15.  Obesity.  Dietary follow-up 16.  Hypoalbuminemia  Supplement initiated on 10/15 17.  Drug-induced constipation Improving  LOS: 15 days A FACE TO FACE EVALUATION WAS PERFORMED  [birth year]28-1979 Marco Kennedy 04/18/2021, 7:19 PM

## 2021-04-18 NOTE — Progress Notes (Signed)
Occupational Therapy Session Note  Patient Details  Name: Marco Kennedy MRN: [id number] Date of Birth: Dec 19, 1950  Today's Date: 04/19/2021 OT Individual Time: 4320-0379 OT Individual Time Calculation (min): 44 min   Short Term Goals: Week 2:  OT Short Term Goal 1 (Week 2): pt will don shirt/TLSO Min A at EOB or up in w/c OT Short Term Goal 2 (Week 2): Pt will perform slide board transfer with Min A in prep for ADL OT Short Term Goal 3 (Week 2): Patient will don LB clothing at bed level with Mod A of 1 helper and AE PRN. OT Short Term Goal 4 (Week 2): Pt will perform sit <> stands in prep for ADL at LRAD with  Mod A of 1  Skilled Therapeutic Interventions/Progress Updates:    Pt greeted in the w/c, wearing his TLSO, and premedicated for pain. Worked on sit<stands using bariatric Stedy. Pt rising into standing with Max A of 1. Pt able to stand with supervision from elevated paddles. Able to stand for 33 sec, 45 sec, and 23 sec windows of time. He then completed 10 mini squats x3 sets. Worked on weight shifting Lt>Rt as well, sitting on paddles again once reaching the point of fatigue. Pt then returned to the w/c and performed x10 w/c push ups for core strengthening and prep for sit<stand transfers, able to maintain buttocks clearance from w/c for 5-10 sec intervals of time. He remained sitting up at close of session, all needs within reach.   Therapy Documentation Precautions:  Precautions Precautions: Fall, Back Precaution Booklet Issued: No Precaution Comments: reviewed back precautions Required Braces or Orthoses: Spinal Brace Spinal Brace: Thoracolumbosacral orthotic, Applied in sitting position Spinal Brace Comments: TLSO when OOB only Restrictions Weight Bearing Restrictions: No  ADL: ADL Eating: Set up Grooming: Setup Where Assessed-Grooming: Edge of bed (sitting EOB with Min to Mod A to maintain static sitting balance) Upper Body Bathing: Moderate assistance Where  Assessed-Upper Body Bathing: Bed level Lower Body Bathing: Dependent Where Assessed-Lower Body Bathing: Bed level Upper Body Dressing: Maximal assistance Where Assessed-Upper Body Dressing: Edge of bed Lower Body Dressing: Dependent Where Assessed-Lower Body Dressing: Bed level Toileting: Setup Where Assessed-Toileting: Other (Comment) (Urinal) Toilet Transfer Method: Unable to assess Tub/Shower Transfer: Unable to assess  Therapy/Group: Individual Therapy  Rilie Glanz A Jaslyne Beeck 04/19/2021, 12:31 PM

## 2021-04-18 NOTE — Progress Notes (Signed)
Pt placed on cpap for the night. °

## 2021-04-19 NOTE — Progress Notes (Signed)
Physical Therapy Session Note  Patient Details  Name: Marco Kennedy MRN: [id number] Date of Birth: 1951-04-15  Today's Date: 04/19/2021 PT Individual Time: 0900-0945 PT Individual Time Calculation (min): 45 min   Short Term Goals: Week 2:  PT Short Term Goal 1 (Week 2): Pt will perform stand pivot transfer with RW PT Short Term Goal 2 (Week 2): Pt will perform STS with min A PT Short Term Goal 3 (Week 2): Pt will initiate gait training  Skilled Therapeutic Interventions/Progress Updates:    Pt handoff from NT after attempting to use bedpan. Total assist to don Tedhose in preparation for OOB. Assist with donning of shorts at bed level per pt preference with min assist for rolling to each side and pt attempting to pull up. Donned shirt with setup assistance. Log roll technique to come to EOB with cues for sequencing and encouragement. Pt able to perform with mod assist at trunk for facilitation but overall did well and managed BLE off the edge of the bed himself. Supervision sitting balance to done TLSO and shoes to prepare for transfer. Lateral lean with CGA for slideboard transfer placement and assist to lift LE to place, with min assist to perform actual transfer on slideboard with +2 for safety for downhill transfer. In therapy gym performed 2 more slideboard transfers with cues for anterior weightshift, assist for management of BLE during transfer and set up/positioning, and cues for improved technique with overall mod assist (+2 for safety). NMR for focus on sit <> stands and progressing weightbearing through BLE and functional strengthening. Several attempts needed and elevated surface with +2 assistance for sit > stand and able to maintain x 3 reps about 30 sec each trial with cues and facilitation for upright posture, quad and hip extension activation. Pt with decreased eccentric control during stand to sit. May benefit from practice in Lite Gait to be able to progress stepping/gait  attempts.  Therapy Documentation Precautions:  Precautions Precautions: Fall, Back Precaution Booklet Issued: No Precaution Comments: reviewed back precautions Required Braces or Orthoses: Spinal Brace Spinal Brace: Thoracolumbosacral orthotic, Applied in sitting position Spinal Brace Comments: TLSO when OOB only Restrictions Weight Bearing Restrictions: No   Pain:  Reports pain in back. Did not rate. States he already had pain medication.  Other Treatments:      Therapy/Group: Individual Therapy  Karolee Stamps Darrol Poke, PT, DPT, CBIS  04/19/2021, 12:36 PM

## 2021-04-20 ENCOUNTER — Inpatient Hospital Stay: Payer: BC Managed Care – PPO | Admitting: Internal Medicine

## 2021-04-20 MED ORDER — SORBITOL 70 % SOLN
60.0000 mL | Freq: Once | Status: AC
  Administered 2021-04-23: 60 mL via ORAL
  Filled 2021-04-20 (×2): qty 60

## 2021-04-20 NOTE — Progress Notes (Signed)
Occupational Therapy Session Note  Patient Details  Name: Marco Kennedy MRN: [id number] Date of Birth: [birth year]  Today's Date: 04/20/2021 OT Individual Time: 0990-6893 OT Individual Time Calculation (min): 57 min    Short Term Goals: Week 1:  OT Short Term Goal 1 (Week 1): Patient will don UB clothing seated EOB with Min A. OT Short Term Goal 1 - Progress (Week 1): Progressing toward goal OT Short Term Goal 2 (Week 1): Patient will complete squat-pivot transfers with Mod A +2. OT Short Term Goal 2 - Progress (Week 1): Progressing toward goal OT Short Term Goal 3 (Week 1): Patient will don LB clothing at bed level with Mod A of 1 helper and AD PRN. OT Short Term Goal 3 - Progress (Week 1): Progressing toward goal Week 2:  OT Short Term Goal 1 (Week 2): pt will don shirt/TLSO Min A at EOB or up in w/c OT Short Term Goal 2 (Week 2): Pt will perform slide board transfer with Min A in prep for ADL OT Short Term Goal 3 (Week 2): Patient will don LB clothing at bed level with Mod A of 1 helper and AE PRN. OT Short Term Goal 4 (Week 2): Pt will perform sit <> stands in prep for ADL at [place] with  Mod A of 1   Skilled Therapeutic Interventions/Progress Updates:    Pt greeted at time of session up in wheelchair, no pain at rest, agreeable to OT session. Self propel to sink surface and performed oral hygiene set up, able to reach all items including water faucet today. Demonstrated and had pt practice donning/doffing leg rests as well, primarily using tactile input 2/2 back precautions and able to do so with mod cues. Self propel to gym with Supervision and extended time, focused on slide board transfer x2 w/c > mat with Mod of 1 and second helper for safety. Squat pivot mat > w/c with Max A of 1 and second helper for safety. Note pt needing max multimodal cues for problem solving, hand/foot placement, and anterior weight shift. SCIFIT for 5 minutes for UB strengthening and global endurance before  transport back to room and set up call bell in reach all needs met. Discussion with pt throughout session as well for ELOS and progress toward OT goals.   Therapy Documentation Precautions:  Precautions Precautions: Fall, Back Precaution Booklet Issued: No Precaution Comments: reviewed back precautions Required Braces or Orthoses: Spinal Brace Spinal Brace: Thoracolumbosacral orthotic, Applied in sitting position Spinal Brace Comments: TLSO when OOB only Restrictions Weight Bearing Restrictions: No     Therapy/Group: Individual Therapy  Viona Gilmore 04/20/2021, 7:24 AM

## 2021-04-20 NOTE — Progress Notes (Signed)
Physical Therapy Weekly Progress Note  Patient Details  Name: Marco Kennedy MRN: [id number] Date of Birth: 11/02/1950  Beginning of progress report period: April 13, 2021 End of progress report period: April 21, 2023  Today's Date: 04/20/2021 PT Individual Time: 0900-1015, 2683-4196 PT Individual Time Calculation (min): 75 min, 48 min   Patient has met 0 of 3 short term goals.  Pt is progressing toward standing goals, but is unable to stand with min A or perform Stand pivot transfer at this time. Will continue to progress as able.   Patient continues to demonstrate the following deficits muscle weakness, decreased cardiorespiratoy endurance, impaired timing and sequencing and abnormal tone, and decreased postural control, decreased balance strategies, and difficulty maintaining precautions and therefore will continue to benefit from skilled PT intervention to increase functional independence with mobility.  Patient progressing toward long term goals..  Continue plan of care.  PT Short Term Goals Week 2:  PT Short Term Goal 1 (Week 2): Pt will perform stand pivot transfer with RW PT Short Term Goal 1 - Progress (Week 2): Progressing toward goal PT Short Term Goal 2 (Week 2): Pt will perform STS with min A PT Short Term Goal 2 - Progress (Week 2): Progressing toward goal PT Short Term Goal 3 (Week 2): Pt will initiate gait training PT Short Term Goal 3 - Progress (Week 2): Not met Week 3:  PT Short Term Goal 1 (Week 3): Pt will perform stand pivot transfer PT Short Term Goal 2 (Week 3): pt will perform car transfer with any level of assist PT Short Term Goal 3 (Week 3): Pt will perform STS with min A  Skilled Therapeutic Interventions/Progress Updates:  Ambulation/gait training;Balance/vestibular training;Community reintegration;Discharge planning;Disease management/prevention;DME/adaptive equipment instruction;Functional electrical stimulation;Functional mobility  training;Neuromuscular re-education;Pain management;Psychosocial support;Patient/family education;Splinting/orthotics;Stair training;Therapeutic Activities;Therapeutic Exercise;UE/LE Strength taining/ROM;UE/LE Coordination activities;Wheelchair propulsion/positioning Session 1: Pt received in bed  and agreeable to therapy. Pt reported 5/10 pain after slideboard transfer, premedicated.  Therapeutic activty: Pt requested to change clothes. Removed SDCs and donned ted hose with tot A, max A to thread pants, min to pull over hips while rolling. Supine > sit with min A, donned/dofffed shirt with supervision. slideboard transfer with min A to prevent anterior slide off board. Min VC to facilitate incr recall of technique.   Therapeutic exercise: Kinetron for LE strength and endurance: "Warm up" at 50 cm/s x 2 min  4x3 min with 2 min rest breaks at 60 cm/s, pt reported medium difficulty throughout, and that the gentle movement felt like it might be "massaging" his back.   W/C management: Pt propelled w/c with BUE to day room for endurance and functional mobility. Self propulsion in the same manner with supervision, 3 x 170 ft, with last 2 laps cued for speed to improve UE endurance and functional mobility. Pt navigated crowded halls without incident for improved independence and maneuverability.   Pt returned to room and remained seated in w/c with all needs in reach to await pending OT session.    Session 2:  Pt seated in w/c on arrival and agreeable to therapy. Pt reports 4-5/10 pain at rest and 6/10 with activity, premedicated and rest breaks as needed.  Therapeutic activity: Pt performed slideboard transfer <> mat table with CGA-min A at times to prevent anterior slide, min VC as needed to move feet since pt has no proprioception.  Pt then directed in Sit to stand x 5 from slightly elevated mat table with +2 assist, min-mod for power up  and to block knees throughout. Pt stood x 30-60 sec each  time for improved Le endurance.  The following performed from highly elevated mat table to decr demand of Sit to stand: X3 bouts of standing marches in preparation for taking steps 3 x 8 squats for LE strength and eccentric knee control to minimize buckling in standing  Pt returned to room and remained in w/c with all needs in reach.   Therapy Documentation Precautions:  Precautions Precautions: Fall, Back Precaution Booklet Issued: No Precaution Comments: reviewed back precautions Required Braces or Orthoses: Spinal Brace Spinal Brace: Thoracolumbosacral orthotic, Applied in sitting position Spinal Brace Comments: TLSO when OOB only Restrictions Weight Bearing Restrictions: No   Therapy/Group: Individual Therapy  Mickel Fuchs 04/20/2021, 11:45 AM

## 2021-04-20 NOTE — Progress Notes (Signed)
PROGRESS NOTE   Subjective/Complaints:  Pt reports pain still decreasing a little more- little better with SB transfers.  No BM over weekend, even with suppository- will order Sorbitol and fllow with enema if need be.     ROS:  Pt denies SOB, abd pain, CP, N/V/(+) C/D, and vision changes  Objective:   No results found. No results for input(s): WBC, HGB, HCT, PLT in the last 72 hours.   No results for input(s): NA, K, CL, CO2, GLUCOSE, BUN, CREATININE, CALCIUM in the last 72 hours.    Intake/Output Summary (Last 24 hours) at 04/20/2021 1102 Last data filed at 04/20/2021 0853 Gross per 24 hour  Intake 656 ml  Output 1459 ml  Net -803 ml     Pressure Injury 04/10/21 Buttocks Left;Posterior;Proximal Stage 1 -  Intact skin with non-blanchable redness of a localized area usually over a bony prominence. (Active)  04/10/21   Location: Buttocks  Location Orientation: Left;Posterior;Proximal  Staging: Stage 1 -  Intact skin with non-blanchable redness of a localized area usually over a bony prominence.  Wound Description (Comments):   Present on Admission:     Physical Exam: Vital Signs Blood pressure (!) 148/70, pulse 63, temperature 97.8 F (36.6 C), temperature source Oral, resp. rate 18, height 5\' 8"  (1.727 m), weight 112.1 kg, SpO2 100 %.    General: awake, alert, appropriate, sitting up in bed; NAD HENT: conjugate gaze; oropharynx moist CV: regular rate; no JVD Pulmonary: CTA B/L; no W/R/R- good air movement GI: soft, NT,  a little distended; hypoactive BS Psychiatric: appropriate Neurological: Ox3  Skin: Warm and dry.  Sacrum not examined today. Psych: Normal mood.  Normal behavior. Musc: Lower extremity edema.  No tenderness in extremities. Neuro: Alert Motor: Bilateral upper extremities: 5/5 proximal distal Bilateral lower extremities: 4/5 proximal distal, stable  Assessment/Plan: 1. Functional  deficits which require 3+ hours per day of interdisciplinary therapy in a comprehensive inpatient rehab setting. Physiatrist is providing close team supervision and 24 hour management of active medical problems listed below. Physiatrist and rehab team continue to assess barriers to discharge/monitor patient progress toward functional and medical goals  Care Tool:  Bathing    Body parts bathed by patient: Right arm, Left arm, Chest, Abdomen, Face   Body parts bathed by helper: Front perineal area, Buttocks, Left lower leg, Right lower leg     Bathing assist Assist Level: Moderate Assistance - Patient 50 - 74%     Upper Body Dressing/Undressing Upper body dressing   What is the patient wearing?: Pull over shirt, Orthosis    Upper body assist Assist Level: Maximal Assistance - Patient 25 - 49%    Lower Body Dressing/Undressing Lower body dressing      What is the patient wearing?: Pants     Lower body assist Assist for lower body dressing: Total Assistance - Patient < 25%     Toileting Toileting    Toileting assist Assist for toileting: Dependent - Patient 0% (bed pan, bed level) Assistive Device Comment: able to manage urinal with set-up assist; reports need for suppository for BM   Transfers Chair/bed transfer  Transfers assist     Chair/bed transfer  assist level: 2 Helpers     Locomotion Ambulation   Ambulation assist   Ambulation activity did not occur: Safety/medical concerns          Walk 10 feet activity   Assist  Walk 10 feet activity did not occur: Safety/medical concerns        Walk 50 feet activity   Assist Walk 50 feet with 2 turns activity did not occur: Safety/medical concerns         Walk 150 feet activity   Assist Walk 150 feet activity did not occur: Safety/medical concerns         Walk 10 feet on uneven surface  activity   Assist Walk 10 feet on uneven surfaces activity did not occur: Safety/medical  concerns         Wheelchair     Assist Is the patient using a wheelchair?: Yes Type of Wheelchair: Manual    Wheelchair assist level: Supervision/Verbal cueing Max wheelchair distance: 250 ft    Wheelchair 50 feet with 2 turns activity    Assist        Assist Level: Supervision/Verbal cueing   Wheelchair 150 feet activity     Assist      Assist Level: Supervision/Verbal cueing   Blood pressure (!) 148/70, pulse 63, temperature 97.8 F (36.6 C), temperature source Oral, resp. rate 18, height 5\' 8"  (1.727 m), weight 112.1 kg, SpO2 100 %.  Medical Problem List and Plan: 1.  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 05/31/2021.  TLSO back brace when out of bed. JP drain removed 10/7.2022  Con't PT and OT/CIR-  2.  Impaired mobility: continue lovenox.              -antiplatelet therapy: N/A 3. Postoperative spinal pain: Continue Voltaren 75 mg twice daily, Robaxin and oxycodone as needed. Added back dilaudid 1mg  q6H prn given severity of pain.   10/11-started ms contin 15 mg BID and stop Dliaudid and con't oxycodone- also add Cymbalta 30 mg QHS for nerve pain and mood.  10/20- increased Oxy to 10-15 mg q4 hours helped  Controlled with meds on 10/22 10/24- pt reports pain a little bette,r but still limiting- con't regimen for now 4. Mood: Provide emotional support  10/10- pt appears depressed- will d/w pt in AM about starting something that could also help pain.   10/11-added cymbalta 30 mg QHS. 10/17-increased Cymbalta to 60 mg QHS             -antipsychotic agents: N/A 5. Neuropsych: This patient is capable of making decisions on his  own behalf. 6. Skin/Wound Care: Routine skin checks 7. Fluids/Electrolytes/Nutrition: Routine in and outs 8.  ID.  Transitioned from intravenous cefazolin to amoxicillin.    Check with ID when to stop Aomoxicllin- ~ 10/20- per chart-  call Dr 11/24 or ID 9.  Acute blood loss anemia.  Latest hemoglobin 8.4.  Follow-up CBC Monday  Hemoglobin 9.3 on 10/17 10.  Hyponatremia.  Sodium 136 on 10/10 labs ordered for Monday 11.  Hypertension.   Continue Norvasc to 10 mg daily  10/24- BP slightly elevated- con't regimen 12.  Irritable bowel syndrome.  Amitiza 24 mcg twice daily 13.  History of gout.  Continue colchicine 14.  OSA.  CPAP 15.  Obesity.  Dietary follow-up 16.  Hypoalbuminemia  Supplement initiated on 10/15 17.  Drug-induced constipation 10/24- no BM since 10/20- will give sorbitol and give enema if need be.    LOS: 17 days A FACE TO FACE EVALUATION WAS PERFORMED  Marco Kennedy 04/20/2021, 11:02 AM

## 2021-04-20 NOTE — Progress Notes (Signed)
Patient states he will place himself on CPAP. Wanted settings adjusted. Now 12 IPAP 4 EPAP with 2 LPM bleed in. RT made patient aware to call if he needed any assistance.

## 2021-04-20 NOTE — Progress Notes (Signed)
Physical Therapy Session Note  Patient Details  Name: Marco Kennedy MRN: [id number] Date of Birth: 1951/02/28  Today's Date: 04/20/2021 PT Individual Time: 9622-2979 PT Individual Time Calculation (min): 40 min   Short Term Goals: Week 1:  PT Short Term Goal 1 (Week 1): Pt will tolerate sitting upright x 5 min with no more than 5/10 pain PT Short Term Goal 1 - Progress (Week 1): Met PT Short Term Goal 2 (Week 1): Pt will initiate w/c mobility PT Short Term Goal 2 - Progress (Week 1): Met PT Short Term Goal 3 (Week 1): Pt will perform bed mobility with mod A consistently adhering to back precautions PT Short Term Goal 3 - Progress (Week 1): Met Week 2:  PT Short Term Goal 1 (Week 2): Pt will perform stand pivot transfer with RW PT Short Term Goal 2 (Week 2): Pt will perform STS with min A PT Short Term Goal 3 (Week 2): Pt will initiate gait training  Skilled Therapeutic Interventions/Progress Updates:   Received pt sitting in WC, pt agreeable to PT treatment, and reported pain 3/10 increasing to 7/10 with mobility in low back. RN notified and present to administer pain medication. Session with emphasis on functional mobility/transfers, generalized strengthening, dynamic sitting balance, and improved activity tolerance. No +2 available during session. TLSO donned throughout session and pt transported to/from room in [facility] total A for time management purposes. Pt performed x 2 slideboard transfers to/from mat with mod A and total A to manage/position legs with transfer. Pt performed the following exercises sitting EOM with supervision and verbal cues for technique with emphasis on core control: -horizontal chest press at 90 degrees with 3lb dowel 2x10 -overhead chest press at 90 degrees with 3lb dowel 2x10 -bicep curls with 6lb dowel 2x12 -hip adduction ball squeezes 2x10 with 5 second isometric hold Pt unable to tolerate further unsupported sitting on mat due to increased back pain, therefore  returned to [place]. Concluded session with pt sitting in [facility] with all needs within reach.   Therapy Documentation Precautions:  Precautions Precautions: Fall, Back Precaution Booklet Issued: No Precaution Comments: reviewed back precautions Required Braces or Orthoses: Spinal Brace Spinal Brace: Thoracolumbosacral orthotic, Applied in sitting position Spinal Brace Comments: TLSO when OOB only Restrictions Weight Bearing Restrictions: No  Therapy/Group: Individual Therapy Alfonse Alpers PT, DPT   04/20/2021, 9:35 AM

## 2021-04-21 NOTE — Progress Notes (Signed)
Occupational Therapy Session Note  Patient Details  Name: Marco Kennedy MRN: [id number] Date of Birth: June 24, 1951  Today's Date: 04/21/2021 OT Individual Time: 1430-1530 OT Individual Time Calculation (min): 60 min    Short Term Goals: Week 1:  OT Short Term Goal 1 (Week 1): Patient will don UB clothing seated EOB with Min A. OT Short Term Goal 1 - Progress (Week 1): Progressing toward goal OT Short Term Goal 2 (Week 1): Patient will complete squat-pivot transfers with Mod A +2. OT Short Term Goal 2 - Progress (Week 1): Progressing toward goal OT Short Term Goal 3 (Week 1): Patient will don LB clothing at bed level with Mod A of 1 helper and AD PRN. OT Short Term Goal 3 - Progress (Week 1): Progressing toward goal  Skilled Therapeutic Interventions/Progress Updates:  Pt greeted seated in w/c  agreeable to OT intervention. Session focus on functional transfers, and  therapeutic activities focused on dynamic standing balance, improving activity tolerance and functional sit<>stands.   Pt completed w/c mobility ~ 163ft with supervision before reporting fatigue. Pt transported remainder of distance for energy conservation. Pt completed SB transfer from w/c>EOM with CGA +2 for safety. Attempted to have pt place SB with currently lacking the coordination at this time with pt needing total A to place SB. Pt worked on sit<>stands in stedy with pt needing MIN A +2 to stand from EOM to stedy. Pt worked on dynamic reaching tasks with RUE with pt needing MIN A +2 to stand from seat of stedy and MIN A to maintain upright posture when reaching out of BOS. Utilized mirror to provide visual feedback to improve posture and body mechanics. Pt completed additional SB transfer from EOM>w/c with CGA +2 for safety. Pt transported back to room with total A where pt was left up in w/c with alarm belt activated and all needs within reach.                Therapy Documentation Precautions:  Precautions Precautions:  Fall, Back Precaution Booklet Issued: No Precaution Comments: reviewed back precautions Required Braces or Orthoses: Spinal Brace Spinal Brace: Thoracolumbosacral orthotic, Applied in sitting position Spinal Brace Comments: TLSO when OOB only Restrictions Weight Bearing Restrictions: No  Pain: pt reports unrated pain when leaning to R side  in low back to place SB, adjusted positioning of SB and allowed rest break    Therapy/Group: Individual Therapy  [name] 04/21/2021, 3:56 PM

## 2021-04-21 NOTE — Progress Notes (Signed)
Pt CPAP is set up and ready with 2L of 02 bleed in. Pt able to place self on CPAP.  PT asked to have RN call RT if assistance needed.

## 2021-04-21 NOTE — Progress Notes (Signed)
PROGRESS NOTE   Subjective/Complaints:  Pt reports 2 Bms in last 24 hours.  Stood a lot yesterday- - at rest, pain not "bad". Little less pain getting OOB.      ROS:   Pt denies SOB, abd pain, CP, N/V/C/D, and vision changes   Objective:   No results found. No results for input(s): WBC, HGB, HCT, PLT in the last 72 hours.   No results for input(s): NA, K, CL, CO2, GLUCOSE, BUN, CREATININE, CALCIUM in the last 72 hours.    Intake/Output Summary (Last 24 hours) at 04/21/2021 0950 Last data filed at 04/21/2021 0758 Gross per 24 hour  Intake 474 ml  Output 950 ml  Net -476 ml     Pressure Injury 04/10/21 Buttocks Left;Posterior;Proximal Stage 1 -  Intact skin with non-blanchable redness of a localized area usually over a bony prominence. (Active)  04/10/21   Location: Buttocks  Location Orientation: Left;Posterior;Proximal  Staging: Stage 1 -  Intact skin with non-blanchable redness of a localized area usually over a bony prominence.  Wound Description (Comments):   Present on Admission:     Physical Exam: Vital Signs Blood pressure 138/75, pulse 70, temperature 98.5 F (36.9 C), resp. rate 19, height 5\' 8"  (1.727 m), weight 112.1 kg, SpO2 99 %.     General: awake, alert, appropriate, sitting up in bed; NAD HENT: conjugate gaze; oropharynx moist CV: regular rate; no JVD Pulmonary: CTA B/L; no W/R/R- good air movement GI: soft, NT, ND, (+)BS- more normoactive Psychiatric: appropriate Neurological: Ox3  Skin: Warm and dry.  Sacrum not examined today. Psych: Normal mood.  Normal behavior. Musc: Lower extremity edema.  No tenderness in extremities. Neuro: Alert Motor: Bilateral upper extremities: 5/5 proximal distal Bilateral lower extremities: 4/5 proximal distal, stable  Assessment/Plan: 1. Functional deficits which require 3+ hours per day of interdisciplinary therapy in a comprehensive inpatient  rehab setting. Physiatrist is providing close team supervision and 24 hour management of active medical problems listed below. Physiatrist and rehab team continue to assess barriers to discharge/monitor patient progress toward functional and medical goals  Care Tool:  Bathing    Body parts bathed by patient: Right arm, Left arm, Chest, Abdomen, Face   Body parts bathed by helper: Front perineal area, Buttocks, Left lower leg, Right lower leg     Bathing assist Assist Level: Moderate Assistance - Patient 50 - 74%     Upper Body Dressing/Undressing Upper body dressing   What is the patient wearing?: Pull over shirt, Orthosis    Upper body assist Assist Level: Maximal Assistance - Patient 25 - 49%    Lower Body Dressing/Undressing Lower body dressing      What is the patient wearing?: Pants     Lower body assist Assist for lower body dressing: Total Assistance - Patient < 25%     Toileting Toileting    Toileting assist Assist for toileting: Dependent - Patient 0% (bed pan, bed level) Assistive Device Comment: able to manage urinal with set-up assist; reports need for suppository for BM   Transfers Chair/bed transfer  Transfers assist     Chair/bed transfer assist level: Minimal Assistance - Patient > 75%  Locomotion Ambulation   Ambulation assist   Ambulation activity did not occur: Safety/medical concerns          Walk 10 feet activity   Assist  Walk 10 feet activity did not occur: Safety/medical concerns        Walk 50 feet activity   Assist Walk 50 feet with 2 turns activity did not occur: Safety/medical concerns         Walk 150 feet activity   Assist Walk 150 feet activity did not occur: Safety/medical concerns         Walk 10 feet on uneven surface  activity   Assist Walk 10 feet on uneven surfaces activity did not occur: Safety/medical concerns         Wheelchair     Assist Is the patient using a wheelchair?:  Yes Type of Wheelchair: Manual    Wheelchair assist level: Supervision/Verbal cueing Max wheelchair distance: 250 ft    Wheelchair 50 feet with 2 turns activity    Assist        Assist Level: Supervision/Verbal cueing   Wheelchair 150 feet activity     Assist      Assist Level: Supervision/Verbal cueing   Blood pressure 138/75, pulse 70, temperature 98.5 F (36.9 C), resp. rate 19, height 5\' 8"  (1.727 m), weight 112.1 kg, SpO2 99 %.  Medical Problem List and Plan: 1.  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 05/31/2021.  TLSO back brace when out of bed. JP drain removed 10/7.2022  Con't PT and OT- team conference today to see how progress is occurring.  2.  Impaired mobility: continue lovenox.              -antiplatelet therapy: N/A 3. Postoperative spinal pain: Continue Voltaren 75 mg twice daily, Robaxin and oxycodone as needed. Added back dilaudid 1mg  q6H prn given severity of pain.   10/11-started ms contin 15 mg BID and stop Dliaudid and con't oxycodone- also add Cymbalta 30 mg QHS for nerve pain and mood.  10/20- increased Oxy to 10-15 mg q4 hours helped  Controlled with meds on 10/22 10/24- pt reports pain a little bette,r but still limiting- con't regimen for now 4. Mood: Provide emotional support  10/10- pt appears depressed- will d/w pt in AM about starting something that could also help pain.   10/11-added cymbalta 30 mg QHS. 10/17-increased Cymbalta to 60 mg QHS  10/25- pain is getting "alittle better every day"- con't regimen             -antipsychotic agents: N/A 5. Neuropsych: This patient is capable of making decisions on  his own behalf. 6. Skin/Wound Care: Routine skin checks 7. Fluids/Electrolytes/Nutrition: Routine in and outs 8.  ID.  Transitioned from intravenous cefazolin to amoxicillin.    Check with ID when to stop Aomoxicllin- ~ 10/20- per chart-  call Dr 11/25 or ID 9.  Acute blood loss anemia.  Latest hemoglobin 8.4.  Follow-up CBC Monday  Hemoglobin 9.3 on 10/17 10.  Hyponatremia.  Sodium 136 on 10/10 labs ordered for Monday 11.  Hypertension.   Continue Norvasc to 10 mg daily  110/25- BP looks good today- con't regimen 12.  Irritable bowel syndrome.  Amitiza 24 mcg twice daily 13.  History of gout.  Continue colchicine 14.  OSA.  CPAP 15.  Obesity.  Dietary follow-up 16.  Hypoalbuminemia  Supplement initiated on 10/15 17.  Drug-induced constipation 10/24- no BM since 10/20- will give sorbitol and give enema if need be.   10/25- 2 Bms after intervention- con't regimen   LOS: 18 days A FACE TO FACE EVALUATION WAS PERFORMED  Dawaun Brancato 04/21/2021, 9:50 AM

## 2021-04-21 NOTE — Progress Notes (Signed)
[name], male   DOB: [birth year], 70 y.o.   MRN: [id number] Met with pt to discuss team conference progress this week and doing better, slow but making progress. He is having less pain and feels can participate more in therapies. He feels will need an extension due to has a long way to go to get to min level of assist. [employer] discharge still 11/11. He plans to contact his Associate Professor to see if can move in new house which is handicapped accessible sooner, it would make it easier for him. Will continue to work on discharge. Wife is working while he is here but comes to visit every other evening. Continue to work on discharge needs.

## 2021-04-21 NOTE — Patient Care Conference (Signed)
Inpatient RehabilitationTeam Conference and Plan of Care Update Date: 04/21/2021   Time: 11:30 AM    Patient Name: Marco Kennedy      Medical Record Number: [id number]  Date of Birth: 12/28/1950 Sex: Male         Room/Bed: 4W06C/4W06C-01 Payor Info: Payor: [employer] / Plan: [employer] COMM PPO / Product Type: *No Product type* /    Admit Date/Time:  04/03/2021  2:44 PM  Primary Diagnosis:  Epidural abscess  Hospital Problems: Principal Problem:   Epidural abscess Active Problems:   Hypoalbuminemia due to protein-calorie malnutrition (HCC)   Essential hypertension   Acute blood loss anemia   Labile blood pressure   Postoperative pain   Pressure injury of skin   Drug induced constipation    Expected Discharge Date: Expected Discharge Date: 05/08/21  Team Members Present: Physician leading conference: Dr. Genice Rouge Social Worker Present: Dossie Der, LCSW Nurse Present: Kennyth Arnold, RN PT Present: Merry Lofty, PT OT Present: Earleen Newport, OT PPS Coordinator present : Edson Snowball, PT     Current Status/Progress Goal Weekly Team Focus  Bowel/Bladder   Continent of B/B.LMB 04/18/21  Remain Continent.      Swallow/Nutrition/ Hydration             ADL's   Min slide board, Max A squat pivot, stedy transfers Max of 1 for sit <> stand, supine > sit Min/Mod, pain improved and able to participate more, BLE weakness significantly limiting  Min A overall  bed mobility for self care, ADL retraining, AE training, squat pivot transfers, sit <> stands as able, DC planning   Mobility   min A bed mobility, min-CGA slideboard, max x 1 STS to RW, knees buckle in ~30 sec of standing, still limited by pain  min A transfers, mod I w/c  LE strength and stand pivot transfers   Communication             Safety/Cognition/ Behavioral Observations            Pain   C/O pain Rt shoulder quite often. Meds  prescribed as needed.  Pain<3. Assess q shift and prn  assess pain  q shift and prn   Skin   Surgical incision-Mid-back  No New breakdown.  Assess Q shift and prn.     Discharge Planning:  Continuing to make slow gains, pain is still a factor. Home with wife who is able to provide assist. will set up [facility] again for follow up   Team Discussion: Had bowel movement. Pain is decreasing. Offered to adjust long acting pain medication but patient choose to wait. Continent B/B. Bowel movement with use of Sorbitol. Pain is mildly controlled. Back incision has dry dressing. Discharging home with wife. Patient on target to meet rehab goals: yes, slow to progress. Min/mod +1 EOB, slide board. Squat pivot max assist +2. ADL's completed at bed level. Left leg buckles, can't recover independently. Requires min assist. Nursing can now use Steady +2.  *See Care Plan and progress notes for long and short-term goals.   Revisions to Treatment Plan:  Adjusting medications.  Teaching Needs: Family education, medication management, pain management, bowel management, safety awareness.  Current Barriers to Discharge: Decreased caregiver support, Home enviroment access/layout, Neurogenic bowel and bladder, Wound care, Lack of/limited family support, Weight, Weight bearing restrictions, and Medication compliance  Possible Resolutions to Barriers: Family education, constipation management, continue current medications.     Medical Summary Current Status: pain still not controlled-  continent B/B; LBM yesterday x2; on MS Contin 15 mg BID and Oxycodone 10-15 mg q4 hours prn; dry dressing on incision;  Barriers to Discharge: Decreased family/caregiver support;Home enviroment access/layout;Neurogenic Bowel & Bladder;Medical stability;Wound care;Weight bearing restrictions;Weight;Other (comments)  Barriers to Discharge Comments: needs to be min A at d/c per wife; pain is still biggest limiting factor due to epidural osteomyelitis;  SB transfers min-mod A; max A squat pivot with 2  people; Possible Resolutions to [employer] Focus: LLE buckles significantly- limited by weakness and pain and endurance; focus on pain control this week; and constipation.  d/c- 11/11- wait for 1 week to see how doing   Continued Need for Acute Rehabilitation Level of Care: The patient requires daily medical management by a physician with specialized training in physical medicine and rehabilitation for the following reasons: Direction of a multidisciplinary physical rehabilitation program to maximize functional independence : Yes Medical management of patient stability for increased activity during participation in an intensive rehabilitation regime.: Yes Analysis of laboratory values and/or radiology reports with any subsequent need for medication adjustment and/or medical intervention. : Yes   I attest that I was present, lead the team conference, and concur with the assessment and plan of the team.   Tennis Must 04/21/2021, 3:35 PM

## 2021-04-21 NOTE — Progress Notes (Signed)
Occupational Therapy Session Note  Patient Details  Name: KIMARI COUDRIET MRN: [id number] Date of Birth: 17-Jan-1951  Today's Date: 04/21/2021 OT Individual Time: 5188-4166 OT Individual Time Calculation (min): 73 min    Short Term Goals: Week 1:  OT Short Term Goal 1 (Week 1): Patient will don UB clothing seated EOB with Min A. OT Short Term Goal 1 - Progress (Week 1): Progressing toward goal OT Short Term Goal 2 (Week 1): Patient will complete squat-pivot transfers with Mod A +2. OT Short Term Goal 2 - Progress (Week 1): Progressing toward goal OT Short Term Goal 3 (Week 1): Patient will don LB clothing at bed level with Mod A of 1 helper and AD PRN. OT Short Term Goal 3 - Progress (Week 1): Progressing toward goal Week 2:  OT Short Term Goal 1 (Week 2): pt will don shirt/TLSO Min A at EOB or up in w/c OT Short Term Goal 2 (Week 2): Pt will perform slide board transfer with Min A in prep for ADL OT Short Term Goal 3 (Week 2): Patient will don LB clothing at bed level with Mod A of 1 helper and AE PRN. OT Short Term Goal 4 (Week 2): Pt will perform sit <> stands in prep for ADL at LRAD with  Mod A of 1   Skilled Therapeutic Interventions/Progress Updates:    Pt greeted at time of session semireclined in bed resting with no pain at rest but does have 6/10 pain with mobility and slide board, RN present at beginning of session for med pass including pain meds. LB dressing bed level for shorts only with Mod/Max A with therapist assist to thread and pt rolling L/R with CGA to don over hips without assist but did need verbal cues to locate and fully pull up in the back. Supine > sit with HOB elevated with Min A for trunk elevation which is an improvement. Slide board Min/CGA with assist to hold feet in place and 2nd helper for safety. Discussion regarding home set up, door widths, provided home measurement sheet for wife Lynden Ang to fill out as well. Self propel > sink with difficulty managing LE's  from sticking to floor surface. Self propel room > gym and focused on UB strengthening with 4# dowel for chest press, overhead press, and FWD circles. 3x5 sets for wheelchair pushups as well with cues for anterior weight shift. Up in chair in room alarm on call bell in reach.    Therapy Documentation Precautions:  Precautions Precautions: Fall, Back Precaution Booklet Issued: No Precaution Comments: reviewed back precautions Required Braces or Orthoses: Spinal Brace Spinal Brace: Thoracolumbosacral orthotic, Applied in sitting position Spinal Brace Comments: TLSO when OOB only Restrictions Weight Bearing Restrictions: No     Therapy/Group: Individual Therapy  Erasmo Score 04/21/2021, 7:11 AM

## 2021-04-21 NOTE — Progress Notes (Signed)
Physical Therapy Session Note  Patient Details  Name: Marco Kennedy MRN: [id number] Date of Birth: 08/13/1950  Today's Date: 04/21/2021 PT Individual Time: 4098-1191; 4782-9562 PT Individual Time Calculation (min): 27 min and 43 mins  Short Term Goals: Week 3:  PT Short Term Goal 1 (Week 3): Pt will perform stand pivot transfer PT Short Term Goal 2 (Week 3): pt will perform car transfer with any level of assist PT Short Term Goal 3 (Week 3): Pt will perform STS with min A  Skilled Therapeutic Interventions/Progress Updates:    Session 1: Patient received sitting up in wc, agreeable to PT. He reports 3/10 pain in mid/low back. He was able to propel himself in wc to therapy gym with supervision. Slow and inefficient strokes noted. Patient completed x3 sit <> stand in // bars with ModA x2 + B knees blocked. Patient with 1 episode of L knee buckling requiring TotalA x2 to recover to wc. Patient with limited/no ability to recover buckling himself. Patient propelling himself back to room in wc, seatbelt alarm on, call light within reach.  Session 2: Patient received sitting up in wc, agreeable to PT. He reports 5/10pain in midback, premedicated. PT providing rest breaks, distractions and repositioning to assist with pain management. Patient propelling himself in wc to therapy gym for time management and energy conservation. Multiple bouts of sit <> stand in [place] completing puzzle in standing to promote increased standing endurance. Patient able to stand ~10-15s at a time before needing to sit back onto [facility] paddles. Grossly ModA to stand from [employer]. Patient completing Kinetron x8 mins at 50cm/s for improved LE strength and endurance. Patient returning to room in wc, seatbelt alarm on, call light within reach.   Therapy Documentation Precautions:  Precautions Precautions: Fall, Back Precaution Booklet Issued: No Precaution Comments: reviewed back precautions Required Braces or Orthoses:  Spinal Brace Spinal Brace: Thoracolumbosacral orthotic, Applied in sitting position Spinal Brace Comments: TLSO when OOB only Restrictions Weight Bearing Restrictions: No    Therapy/Group: Individual Therapy  Elizebeth Koller, PT, DPT, CBIS  04/21/2021, 7:42 AM

## 2021-04-22 MED ORDER — MORPHINE SULFATE ER 15 MG PO TBCR: 15.0000 mg | EXTENDED_RELEASE_TABLET | Freq: Once | ORAL | Status: DC

## 2021-04-22 MED ORDER — MORPHINE SULFATE ER 15 MG PO TBCR
15.0000 mg | EXTENDED_RELEASE_TABLET | Freq: Two times a day (BID) | ORAL | Status: DC
  Administered 2021-04-22 – 2021-05-08 (×32): 15 mg via ORAL
  Filled 2021-04-22 (×32): qty 1

## 2021-04-22 NOTE — Progress Notes (Signed)
2L of 02 bleed In, pt able to place self. Pt instructed to have RN call RT if assistance needed.

## 2021-04-22 NOTE — Progress Notes (Signed)
PROGRESS NOTE   Subjective/Complaints:  Pt reports that pain is still biggest limiter- he also only took 10 mg Oxycodone this AM  Feels like MS Contin hasn't worked well when gets it at 8-8:30 am before therapy- and so hurts the most in AM.  Sensation is still very impaired- thought was on ties yesterday- but was flat on feet.   Used steady to get back in bed with nursing for first time yesterday.   ROS:   Pt denies SOB, abd pain, CP, N/V/C/D, and vision changes  Objective:   No results found. No results for input(s): WBC, HGB, HCT, PLT in the last 72 hours.   No results for input(s): NA, K, CL, CO2, GLUCOSE, BUN, CREATININE, CALCIUM in the last 72 hours.    Intake/Output Summary (Last 24 hours) at 04/22/2021 0931 Last data filed at 04/22/2021 0800 Gross per 24 hour  Intake 597 ml  Output 900 ml  Net -303 ml     Pressure Injury 04/10/21 Buttocks Left;Posterior;Proximal Stage 1 -  Intact skin with non-blanchable redness of a localized area usually over a bony prominence. (Active)  04/10/21   Location: Buttocks  Location Orientation: Left;Posterior;Proximal  Staging: Stage 1 -  Intact skin with non-blanchable redness of a localized area usually over a bony prominence.  Wound Description (Comments):   Present on Admission:     Physical Exam: Vital Signs Blood pressure (!) 146/75, pulse 69, temperature 98.1 F (36.7 C), temperature source Oral, resp. rate 18, height 5\' 8"  (1.727 m), weight 112.1 kg, SpO2 98 %.      General: awake, alert, appropriate, sitting up in bed; finished breakfast; BMI 37; NAD HENT: conjugate gaze; oropharynx moist CV: regular rate; no JVD Pulmonary: CTA B/L; no W/R/R- good air movement GI: soft, NT, ND, (+)BS Psychiatric: appropriate Neurological: Ox3 Decreased sensation from thoracic down to feet B/L- worse thighs and down.  Skin: Warm and dry.  Sacrum not examined today. Psych:  Normal mood.  Normal behavior. Musc: Lower extremity edema.  No tenderness in extremities. Neuro: Alert Motor: Bilateral upper extremities: 5/5 proximal distal Bilateral lower extremities: 4/5 proximal distal, stable  Assessment/Plan: 1. Functional deficits which require 3+ hours per day of interdisciplinary therapy in a comprehensive inpatient rehab setting. Physiatrist is providing close team supervision and 24 hour management of active medical problems listed below. Physiatrist and rehab team continue to assess barriers to discharge/monitor patient progress toward functional and medical goals  Care Tool:  Bathing    Body parts bathed by patient: Right arm, Left arm, Chest, Abdomen, Face   Body parts bathed by helper: Front perineal area, Buttocks, Left lower leg, Right lower leg     Bathing assist Assist Level: Moderate Assistance - Patient 50 - 74%     Upper Body Dressing/Undressing Upper body dressing   What is the patient wearing?: Pull over shirt, Orthosis    Upper body assist Assist Level: Maximal Assistance - Patient 25 - 49%    Lower Body Dressing/Undressing Lower body dressing      What is the patient wearing?: Pants     Lower body assist Assist for lower body dressing: Maximal Assistance - Patient 25 -  49% (assist to thread, pt rolling L/R to pull over hips without assist)     Toileting Toileting    Toileting assist Assist for toileting: Dependent - Patient 0% (bed pan, bed level) Assistive Device Comment: able to manage urinal with set-up assist; reports need for suppository for BM   Transfers Chair/bed transfer  Transfers assist     Chair/bed transfer assist level: Contact Guard/Touching assist (SB transfer)     Locomotion Ambulation   Ambulation assist   Ambulation activity did not occur: Safety/medical concerns          Walk 10 feet activity   Assist  Walk 10 feet activity did not occur: Safety/medical concerns        Walk 50  feet activity   Assist Walk 50 feet with 2 turns activity did not occur: Safety/medical concerns         Walk 150 feet activity   Assist Walk 150 feet activity did not occur: Safety/medical concerns         Walk 10 feet on uneven surface  activity   Assist Walk 10 feet on uneven surfaces activity did not occur: Safety/medical concerns         Wheelchair     Assist Is the patient using a wheelchair?: Yes Type of Wheelchair: Manual    Wheelchair assist level: Supervision/Verbal cueing Max wheelchair distance: 250 ft    Wheelchair 50 feet with 2 turns activity    Assist        Assist Level: Supervision/Verbal cueing   Wheelchair 150 feet activity     Assist      Assist Level: Supervision/Verbal cueing   Blood pressure (!) 146/75, pulse 69, temperature 98.1 F (36.7 C), temperature source Oral, resp. rate 18, height 5\' 8"  (1.727 m), weight 112.1 kg, SpO2 98 %.  Medical Problem List and Plan: 1.  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 05/31/2021.  TLSO back brace when out of bed. JP drain removed 10/7.2022  Con't PT and OT/CIR- pt asking for extension, but d/c date 11/11- so will see how progressing next week  2.  Impaired mobility: continue lovenox.              -antiplatelet therapy: N/A 3. Postoperative spinal pain: Continue Voltaren 75 mg twice daily, Robaxin and oxycodone as needed. Added back dilaudid 1mg  q6H prn given severity of pain.   10/11-started ms contin 15 mg BID and stop Dliaudid and con't oxycodone- also add Cymbalta 30 mg QHS for nerve pain and mood.  10/20- increased Oxy to 10-15 mg q4 hours helped   Controlled with meds on 10/22 10/24- pt reports pain a little bette,r but still limiting- con't regimen for now  10/26- will change MS Contin to 6am and 6pm- see if that helps pain anymore for Am therapy esp 4. Mood: Provide emotional support  10/10- pt appears depressed- will d/w pt in AM about starting something that could also help pain.   10/11-added cymbalta 30 mg QHS. 10/17-increased Cymbalta to 60 mg QHS 10/26- mood brighter- con't regimen             -antipsychotic agents: N/A 5. Neuropsych: This patient is capable of making decisions on his own behalf. 6. Skin/Wound Care: Routine  skin checks 7. Fluids/Electrolytes/Nutrition: Routine in and outs 8.  ID.  Transitioned from intravenous cefazolin to amoxicillin.    Check with ID when to stop Aomoxicllin- ~ 10/20- per chart-  call Dr Dorna Bloom or ID 9.  Acute blood loss anemia.  Latest hemoglobin 8.4.  Follow-up CBC Monday  Hemoglobin 9.3 on 10/17 10.  Hyponatremia.  Sodium 136 on 10/10 labs ordered for Monday 11.  Hypertension.   Continue Norvasc to 10 mg daily  10/26- BP on borderline- 140s/70s- will con't regimen 12.  Irritable bowel syndrome.  Amitiza 24 mcg twice daily 13.  History of gout.  Continue colchicine 14.  OSA.  CPAP  10/26- getting CPAP nightly- con't regimen 15.  Obesity.  Dietary follow-up 16.  Hypoalbuminemia  Supplement initiated on 10/15 17.  Drug-induced constipation 10/24- no BM since 10/20- will give sorbitol and give enema if need be.   10/25- 2 Bms after intervention- con't regimen   LOS: 19 days A FACE TO FACE EVALUATION WAS PERFORMED  Ronalee Scheunemann 04/22/2021, 9:31 AM

## 2021-04-22 NOTE — Progress Notes (Signed)
Occupational Therapy Weekly Progress Note  Patient Details  Name: Marco Kennedy MRN: [id number] Date of Birth: September 25, 1950  Beginning of progress report period: April 14, 2021 End of progress report period: April 22, 2021  Today's Date: 04/22/2021 OT Individual Time: 2878-6767 and 2094-7096 OT Individual Time Calculation (min): 56 min and 41 min   Patient has met 2 of 4 short term goals.  Pt is slowly progressing toward OT goals, able to perform slide board transfers with Min/Mod of 1 with second helper for safety, Mod A for LB dressing shorts only with AE bed level, and able to perform UB ADLs at sink level with Min/Supervision. Pt is performing squat pivot transfers with Max A x2 to wheelchair and mat but has not progressed to performing at [facility] yet. Plan to continue to ADL retraining and improving functional ADL transfers to decrease caregiver burden for spouse and improve independence. Pt also planning to continue to look into moving to a more accessible 2nd home that he owns, will continue DC planning and DME needed.   Patient continues to demonstrate the following deficits: muscle weakness, decreased cardiorespiratoy endurance, impaired timing and sequencing, unbalanced muscle activation, decreased coordination, and decreased motor planning, and decreased sitting balance, decreased standing balance, decreased postural control, and decreased balance strategies and therefore will continue to benefit from skilled OT intervention to enhance overall performance with BADL, iADL, and Reduce care partner burden.  Patient progressing toward long term goals..  Continue plan of care.  OT Short Term Goals Week 2:  OT Short Term Goal 1 (Week 2): pt will don shirt/TLSO Min A at EOB or up in w/c OT Short Term Goal 1 - Progress (Week 2): Progressing toward goal OT Short Term Goal 2 (Week 2): Pt will perform slide board transfer with Min A in prep for ADL OT Short Term Goal 2 - Progress (Week 2):  Met OT Short Term Goal 3 (Week 2): Patient will don LB clothing at bed level with Mod A of 1 helper and AE PRN. OT Short Term Goal 3 - Progress (Week 2): Met OT Short Term Goal 4 (Week 2): Pt will perform sit <> stands in prep for ADL at [place] with  Mod A of 1 OT Short Term Goal 4 - Progress (Week 2): Progressing toward goal Week 3:  OT Short Term Goal 1 (Week 3): Pt will perform squat pivot transfers to BSC/toilet with Mod A x2 OT Short Term Goal 2 (Week 3): Pt will perform LB dress Min A with AE PRN bed level OT Short Term Goal 3 (Week 3): pt will don TLSO Min A at EOB  Skilled Therapeutic Interventions/Progress Updates:    Pt greeted at time of session supine in bed, just finishing up med pass with nursing. Did not rate pain, had questions regarding med regime and passed along to nursing. Discussed with pt potentially purchasing off brand Stedy for transfers at home for bathroom, bed, toilet to help wife if pt's strength does not improve and if needed at time of DC, pt very receptive and showed where to purchase online. Pt using urinal bed level Mod I, performed LB bathing periarea bed level with HOB elevated and LB dressing with reacher to thread and able to roll L/R with CGA to don over hips with Mod A overall for shorts. Supine > sit Min A with HOB elevated and sitting EOB donned TLSO with Mod A. Squat pivot bed > chair with Max A with 2nd helper for safety.  Self propel to sink and UB bathe/dress. Set up to shave 2/2 time constraints with NT present to provide assist as needed.    Session 2: Pt greeted at time of session up in wheelchair with no pain reported, did not affect session. Pt stating he felt like back was "sore" from previous session but did not rate pain. Pt wanting to focus session on sit <> stands and standing balance/tolerance in stedy. Transported to main gym total A for time, Stedy w/c <> mat and focused on body mechanics and technique for sit <> stands and pushing through BLEs to  come up to standing, able to stand for 10, 15, 20, 25, and 30 second increments. Sit > stand fluctuating from Min-Mod A with extended time to reach hip extension and Max multimodal cues. Back in room, set up alarm on call bell in reach.    Therapy Documentation Precautions:  Precautions Precautions: Fall, Back Precaution Booklet Issued: No Precaution Comments: reviewed back precautions Required Braces or Orthoses: Spinal Brace Spinal Brace: Thoracolumbosacral orthotic, Applied in sitting position Spinal Brace Comments: TLSO when OOB only Restrictions Weight Bearing Restrictions: No     Therapy/Group: Individual Therapy  Viona Gilmore 04/22/2021, 7:13 AM

## 2021-04-22 NOTE — Progress Notes (Signed)
Physical Therapy Session Note  Patient Details  Name: Marco Kennedy MRN: [id number] Date of Birth: [birth year]  Today's Date: 04/22/2021 PT Individual Time: 1003-1059, 1457-1535 PT Individual Time Calculation (min): 56 min, 38 min  Short Term Goals: Week 1:  PT Short Term Goal 1 (Week 1): Pt will tolerate sitting upright x 5 min with no more than 5/10 pain PT Short Term Goal 1 - Progress (Week 1): Met PT Short Term Goal 2 (Week 1): Pt will initiate w/c mobility PT Short Term Goal 2 - Progress (Week 1): Met PT Short Term Goal 3 (Week 1): Pt will perform bed mobility with mod A consistently adhering to back precautions PT Short Term Goal 3 - Progress (Week 1): Met Week 2:  PT Short Term Goal 1 (Week 2): Pt will perform stand pivot transfer with RW PT Short Term Goal 1 - Progress (Week 2): Progressing toward goal PT Short Term Goal 2 (Week 2): Pt will perform STS with min A PT Short Term Goal 2 - Progress (Week 2): Progressing toward goal PT Short Term Goal 3 (Week 2): Pt will initiate gait training PT Short Term Goal 3 - Progress (Week 2): Not met  Skilled Therapeutic Interventions/Progress Updates:    Session 1: Pt seated in w/c on arrival and agreeable to therapy. Seated in front of sink after OT session. Pt reported pain levels that were "about the same" as previous session, with incr pain after performing squat pivot transfers, rest and positioning provided as needed. Pt transported to therapy gym for time management and energy conservation. Pt and therapist discussed car transfers, deciding his vehicle is too tall for a safe slide board transfer and he will need to be able to perform stand or squat pivot transfer. Transitioned to squat pivot <> mat table x 6 to both directions. Pt required L knee block and max A fading to mod A with practice. Pt also directed in part practice of transfer, performing lateral scoots and anterior shifts onto feet. Max cueing required throughout for  technique to minimize pain. Pt performed 10 minutes on UBE at level 6, alternating 2 min forward and reverse. Pt returned to room and remained seated in w/c with all needs in reach.   Session 2: Pt seated in w/c on arrival and agreeable to therapy. Pt reported 4/10 pain at baseline, up to 6/10 with activity. Therapist provided rest and positioning as needed. Pt transported to therapy gym for time management and energy conservation. Pt performed max A squat pivot to mat table, pt was unable to clear gap in one scoot and required +2 assist to complete transfer. Pt then directed in Sit to stand to RW x 3 for improved strength and mobility, mod Ax2 throughout, with therapist blocking knees and rehab tech providing assist from behind to power up. From elevated mat table to decr demand of Sit to stand, pt performed 2 bouts of marches and 3 bouts of stepping forward and back with guarding at knees and +2 assist required for occ knee buckling. Seated rest break between each bout. While marching, pt's RLE tends to migrate backward while his LLE migrates forward, until he is standing in split stance. Pt is occ aware of foot placement, and is able to correct when cued. Pt performed min A squat pivot to w/c toward R side. Cues for placement and technique. Pt then performed kinetron at 70 cm/sec x 5 min for endurance and posterior chain strength. Pt propelled w/c with BUE back to  room and remained in w/c with all needs in reach.   Therapy Documentation Precautions:  Precautions Precautions: Fall, Back Precaution Booklet Issued: No Precaution Comments: reviewed back precautions Required Braces or Orthoses: Spinal Brace Spinal Brace: Thoracolumbosacral orthotic, Applied in sitting position Spinal Brace Comments: TLSO when OOB only Restrictions Weight Bearing Restrictions: No General:   Vital Signs:  Pain: Pain Assessment Pain Scale: 0-10 Pain Score: 5  Pain Type: Acute pain Pain Location: Back Pain  Intervention(s): Medication (See eMAR) Mobility:   Locomotion :    Trunk/Postural Assessment :    Balance:   Exercises:   Other Treatments:      Therapy/Group: Individual Therapy  Mickel Fuchs 04/22/2021, 10:42 AM

## 2021-04-23 NOTE — Progress Notes (Addendum)
Occupational Therapy Session Note  Patient Details  Name: Marco Kennedy MRN: [id number] Date of Birth: [birth year]  Today's Date: 04/23/2021 OT Individual Time: 1300-1355 OT Individual Time Calculation (min): 55 min    Short Term Goals: Week 3:  OT Short Term Goal 1 (Week 3): Pt will perform squat pivot transfers to BSC/toilet with Mod A x2 OT Short Term Goal 2 (Week 3): Pt will perform LB dress Min A with AE PRN bed level OT Short Term Goal 3 (Week 3): pt will don TLSO Min A at EOB  Skilled Therapeutic Interventions/Progress Updates:    Pt resting in w/c upon arrival and agreeable to therapy. SB transfer to EOM with min A. Pt engaged in sit<>stand from elevated mat with max A+2 and Bil knees blocked. Pt's knees hyperextend when full erect. Pt able to maintain standing for approx 10 secs with heavy reliance on BUE support. Sit<>stand X 8 with extended rest breaks. Pt transitioned to BUE therex on [employer] (7 mins random load 4 X 2 in opposite directions.) Pt returned to room and remained in w/c with all needs within reach and belt alarm activated.   Therapy Documentation Precautions:  Precautions Precautions: Fall, Back Precaution Booklet Issued: No Precaution Comments: reviewed back precautions Required Braces or Orthoses: Spinal Brace Spinal Brace: Thoracolumbosacral orthotic, Applied in sitting position Spinal Brace Comments: TLSO when OOB only Restrictions Weight Bearing Restrictions: No   Pain:  Pt reports that he is "ok right now." RN admin meds during session   Therapy/Group: Individual Therapy  Rich Brave 04/23/2021, 2:21 PM

## 2021-04-23 NOTE — Progress Notes (Signed)
PROGRESS NOTE   Subjective/Complaints:  Pt said will "get back to me"- hasn't tested getting pain meds eariler today quite yet-  Thinks might be able to go to renovated home- so has ramp/no steps, etc- LBM yesterday .   ROS:   Pt denies SOB, abd pain, CP, N/V/C/D, and vision changes  Objective:   No results found. No results for input(s): WBC, HGB, HCT, PLT in the last 72 hours.   No results for input(s): NA, K, CL, CO2, GLUCOSE, BUN, CREATININE, CALCIUM in the last 72 hours.    Intake/Output Summary (Last 24 hours) at 04/23/2021 1040 Last data filed at 04/23/2021 0243 Gross per 24 hour  Intake 720 ml  Output 1250 ml  Net -530 ml     Pressure Injury 04/10/21 Buttocks Left;Posterior;Proximal Stage 1 -  Intact skin with non-blanchable redness of a localized area usually over a bony prominence. (Active)  04/10/21   Location: Buttocks  Location Orientation: Left;Posterior;Proximal  Staging: Stage 1 -  Intact skin with non-blanchable redness of a localized area usually over a bony prominence.  Wound Description (Comments):   Present on Admission:     Physical Exam: Vital Signs Blood pressure 136/70, pulse 64, temperature 97.8 F (36.6 C), resp. rate 18, height 5\' 8"  (1.727 m), weight 112.1 kg, SpO2 100 %.        General: awake, alert, appropriate, sitting up in bed; flat affect; NAD HENT: conjugate gaze; oropharynx moist CV: regular rate; no JVD Pulmonary: CTA B/L; no W/R/R- good air movement GI: soft, NT, ND, (+)BS- normoactive Psychiatric: appropriate- flat, but less depressed appearing Neurological: Ox3  Decreased sensation from thoracic down to feet B/L- worse thighs and down.  Skin: Warm and dry.  Sacrum not examined today. Psych: Normal mood.  Normal behavior. Musc: Lower extremity edema.  No tenderness in extremities. Neuro: Alert Motor: Bilateral upper extremities: 5/5 proximal distal Bilateral  lower extremities: 4/5 proximal distal, stable  Assessment/Plan: 1. Functional deficits which require 3+ hours per day of interdisciplinary therapy in a comprehensive inpatient rehab setting. Physiatrist is providing close team supervision and 24 hour management of active medical problems listed below. Physiatrist and rehab team continue to assess barriers to discharge/monitor patient progress toward functional and medical goals  Care Tool:  Bathing    Body parts bathed by patient: Right arm, Left arm, Chest, Abdomen, Face, Front perineal area, Right upper leg, Left upper leg   Body parts bathed by helper: Buttocks, Right lower leg, Left lower leg     Bathing assist Assist Level: Moderate Assistance - Patient 50 - 74%     Upper Body Dressing/Undressing Upper body dressing   What is the patient wearing?: Pull over shirt, Orthosis    Upper body assist Assist Level: Moderate Assistance - Patient 50 - 74%    Lower Body Dressing/Undressing Lower body dressing      What is the patient wearing?: Pants     Lower body assist Assist for lower body dressing: Moderate Assistance - Patient 50 - 74% (rolling bed level, with AE to thread)     Toileting Toileting    Toileting assist Assist for toileting: Dependent - Patient 0% (bed pan,  bed level) Assistive Device Comment: able to manage urinal with set-up assist; reports need for suppository for BM   Transfers Chair/bed transfer  Transfers assist     Chair/bed transfer assist level: Contact Guard/Touching assist (SB transfer)     Locomotion Ambulation   Ambulation assist   Ambulation activity did not occur: Safety/medical concerns          Walk 10 feet activity   Assist  Walk 10 feet activity did not occur: Safety/medical concerns        Walk 50 feet activity   Assist Walk 50 feet with 2 turns activity did not occur: Safety/medical concerns         Walk 150 feet activity   Assist Walk 150 feet  activity did not occur: Safety/medical concerns         Walk 10 feet on uneven surface  activity   Assist Walk 10 feet on uneven surfaces activity did not occur: Safety/medical concerns         Wheelchair     Assist Is the patient using a wheelchair?: Yes Type of Wheelchair: Manual    Wheelchair assist level: Supervision/Verbal cueing Max wheelchair distance: 250 ft    Wheelchair 50 feet with 2 turns activity    Assist        Assist Level: Supervision/Verbal cueing   Wheelchair 150 feet activity     Assist      Assist Level: Supervision/Verbal cueing   Blood pressure 136/70, pulse 64, temperature 97.8 F (36.6 C), resp. rate 18, height 5\' 8"  (1.727 m), weight 112.1 kg, SpO2 100 %.  Medical Problem List and Plan: 1.  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 05/31/2021.  TLSO back brace when out of bed. JP drain removed 10/7.2022 D/c date 11/11- will determine next week of needs to move  Con't PT and OT- slow progress 2.  Impaired mobility: continue lovenox.              -antiplatelet therapy: N/A 3. Postoperative spinal pain: Continue Voltaren 75 mg twice daily, Robaxin and oxycodone as needed. Added back dilaudid 1mg  q6H prn given severity of pain.   10/11-started ms contin 15 mg BID and stop Dliaudid and con't oxycodone- also add Cymbalta 30 mg QHS for nerve pain and mood.  10/20- increased Oxy to 10-15 mg q4 hours helped  Controlled with meds on 10/22 10/24- pt reports pain a little bette,r but still limiting- con't regimen for now  10/26- will change MS Contin to 6am and 6pm- see if that helps pain anymore for Am therapy  esp  10/27- will let me know in AM if helped- if not, will need to ramp up meds some.  4. Mood: Provide emotional support  10/10- pt appears depressed- will d/w pt in AM about starting something that could also help pain.   10/11-added cymbalta 30 mg QHS. 10/17-increased Cymbalta to 60 mg QHS 10/26- mood brighter- con't regimen             -antipsychotic agents: N/A 5. Neuropsych: This patient is capable of making decisions on his own behalf. 6. Skin/Wound Care: Routine skin checks 7. Fluids/Electrolytes/Nutrition: Routine in and outs 8.  ID.  Transitioned from intravenous cefazolin to amoxicillin.  Check with ID when to stop Aomoxicllin- ~ 10/20- per chart-  call Dr Dorna Bloom or ID 9.  Acute blood loss anemia.  Latest hemoglobin 8.4.  Follow-up CBC Monday  Hemoglobin 9.3 on 10/17 10.  Hyponatremia.  Sodium 136 on 10/10 labs ordered for Monday 11.  Hypertension.   Continue Norvasc to 10 mg daily  10/26- BP on borderline- 140s/70s- will con't regimen 12.  Irritable bowel syndrome.  Amitiza 24 mcg twice daily 13.  History of gout.  Continue colchicine 14.  OSA.  CPAP  10/26- getting CPAP nightly- con't regimen 15.  Obesity.  Dietary follow-up 16.  Hypoalbuminemia  Supplement initiated on 10/15 17.  Drug-induced constipation 10/24- no BM since 10/20- will give sorbitol and give enema if need be.   10/25- 2 Bms after intervention- con't regimen  10/27- LBM yesterday- going better- con't regimen   LOS: 20 days A FACE TO FACE EVALUATION WAS PERFORMED  Marco Kennedy 04/23/2021, 10:40 AM

## 2021-04-23 NOTE — Progress Notes (Signed)
Occupational Therapy Session Note  Patient Details  Name: Marco Kennedy MRN: [id number] Date of Birth: [birth year]  Today's Date: 04/23/2021 OT Group Time: 1430-1530 OT Group Time Calculation (min): 60 min   Short Term Goals: Week 3:  OT Short Term Goal 1 (Week 3): Pt will perform squat pivot transfers to BSC/toilet with Mod A x2 OT Short Term Goal 2 (Week 3): Pt will perform LB dress Min A with AE PRN bed level OT Short Term Goal 3 (Week 3): pt will don TLSO Min A at EOB  Skilled Therapeutic Interventions/Progress Updates:  Pt participated in group session with a focus on BUE strength and endurance to facilitate improved activity tolerance and strength for higher level BADLs and functional mobility tasks. Pt engaged in seated UB therapeutic activity where pt was instructed to roll large dice when it was pts turn. Each number rolled correlated with UB therex/ number of repeptitions  listed on board. UB therex included flys, chest presses, punches, bicep curls, upward rows, and over heard presses with a range of reps from 10-20.  Pt utilized 3lb hand weights during session. Pt utilizes modifications such as completing therex unilaterally, modifying number of repetitions and taking rest breaks as needed. Education provided on importance of determining appropriate modifications that benefited each pt  in order to meet pts specific needs. Pt actively participating in group conversations and encouraging other group members during therapeutic activity. Session ended with ball tosses to other members in group ~ 5 mins. Added in social interaction piece by having each pt share a goal they accomplished today with pt sharing that his goal was that he was able to get out of bed independently today! Session ended with 2 mins of guided deep breathing where pts were instructed on sequence of deep breathing and benefits of deep breathing to accommodate for stress, anxiety and relaxation. Pt transported back to  room with by this OTA where pt was left up in w/c with alarm belt activated and all needs within reach.   Therapy Documentation Precautions:  Precautions Precautions: Fall, Back Precaution Booklet Issued: No Precaution Comments: reviewed back precautions Required Braces or Orthoses: Spinal Brace Spinal Brace: Thoracolumbosacral orthotic, Applied in sitting position Spinal Brace Comments: TLSO when OOB only Restrictions Weight Bearing Restrictions: No  Pain: Pt reports unrated pain during session, rest break provided as well as relaxation techniques.    Therapy/Group: Group Therapy  [name] 04/23/2021, 4:17 PM

## 2021-04-23 NOTE — Progress Notes (Signed)
Physical Therapy Session Note  Patient Details  Name: Marco Kennedy MRN: [id number] Date of Birth: Jul 28, 1950  Today's Date: 04/23/2021 PT Individual Time: 0900-1000 PT Individual Time Calculation (min): 60 min   Short Term Goals: Week 1:  PT Short Term Goal 1 (Week 1): Pt will tolerate sitting upright x 5 min with no more than 5/10 pain PT Short Term Goal 1 - Progress (Week 1): Met PT Short Term Goal 2 (Week 1): Pt will initiate w/c mobility PT Short Term Goal 2 - Progress (Week 1): Met PT Short Term Goal 3 (Week 1): Pt will perform bed mobility with mod A consistently adhering to back precautions PT Short Term Goal 3 - Progress (Week 1): Met Week 2:  PT Short Term Goal 1 (Week 2): Pt will perform stand pivot transfer with RW PT Short Term Goal 1 - Progress (Week 2): Progressing toward goal PT Short Term Goal 2 (Week 2): Pt will perform STS with min A PT Short Term Goal 2 - Progress (Week 2): Progressing toward goal PT Short Term Goal 3 (Week 2): Pt will initiate gait training PT Short Term Goal 3 - Progress (Week 2): Not met Week 3:  PT Short Term Goal 1 (Week 3): Pt will perform stand pivot transfer PT Short Term Goal 2 (Week 3): pt will perform car transfer with any level of assist PT Short Term Goal 3 (Week 3): Pt will perform STS with min A  Skilled Therapeutic Interventions/Progress Updates:    pt received in bed and agreeable to therapy. Pt reports 3/10 pain at rest, up to 5/10 with activity, premedicated. No other intervention required during session. Donned ted hose with total assist. Bed mobility with supervision and use of bed features. Pt requested to change shirt, and did so with CGA for minor LOB. slideboard transfer to w/c with CGA at knees for anterior slide. Pt then performed oral hygiene and combed hair mod I at w/c level. Pt transported to therapy gym for time management and energy conservation. Squat pivot to mat table with mod a toward R side. Pt then performed  part practice of anterior weight shift x 5 for improved transfer technique and Le strength. Squat pivot >w/c with mod A x 2 for transfer to more difficult side. Transitioned to standing in //bars x 3 with CL shoulder touches to decr UE reliance and encourage slight weight shift. Pt propelled chair with BUE back to room and was left with all needs in reach and alarm active.   Therapy Documentation Precautions:  Precautions Precautions: Fall, Back Precaution Booklet Issued: No Precaution Comments: reviewed back precautions Required Braces or Orthoses: Spinal Brace Spinal Brace: Thoracolumbosacral orthotic, Applied in sitting position Spinal Brace Comments: TLSO when OOB only Restrictions Weight Bearing Restrictions: No    Therapy/Group: Individual Therapy  Mickel Fuchs 04/23/2021, 3:59 PM

## 2021-04-23 NOTE — Progress Notes (Signed)
Physical Therapy Session Note  Patient Details  Name: Marco Kennedy MRN: [id number] Date of Birth: Nov 28, 1950  Today's Date: 04/23/2021 PT Individual Time: 1015-1100 PT Individual Time Calculation (min): 45 min   Short Term Goals: Week 1:  PT Short Term Goal 1 (Week 1): Pt will tolerate sitting upright x 5 min with no more than 5/10 pain PT Short Term Goal 1 - Progress (Week 1): Met PT Short Term Goal 2 (Week 1): Pt will initiate w/c mobility PT Short Term Goal 2 - Progress (Week 1): Met PT Short Term Goal 3 (Week 1): Pt will perform bed mobility with mod A consistently adhering to back precautions PT Short Term Goal 3 - Progress (Week 1): Met Week 2:  PT Short Term Goal 1 (Week 2): Pt will perform stand pivot transfer with RW PT Short Term Goal 1 - Progress (Week 2): Progressing toward goal PT Short Term Goal 2 (Week 2): Pt will perform STS with min A PT Short Term Goal 2 - Progress (Week 2): Progressing toward goal PT Short Term Goal 3 (Week 2): Pt will initiate gait training PT Short Term Goal 3 - Progress (Week 2): Not met Week 3:  PT Short Term Goal 1 (Week 3): Pt will perform stand pivot transfer PT Short Term Goal 2 (Week 3): pt will perform car transfer with any level of assist PT Short Term Goal 3 (Week 3): Pt will perform STS with min A  Skilled Therapeutic Interventions/Progress Updates:    6/10 pain, back  Pt initially oob in wc and agreeable to session.  Wc propulsion x 122f to gym.  sliding board transfer to mat w/set up and min assist.   Worked on Sit to stand from elevated mat using stedy and mirror + verbal and tactile cues for feedback to improve quad and glut activation/decrease UE reliance and achievie improved upright posture. Mod assist overall to stand, knees buckle w/fatigue/assisted transition to perched sitting in stedy between efforts.    Pt rests several min rest between efforts, stands between 15 and 35 sec, mult efforts.  Stedy transfer to wc and mod  assist for controlled lower to wc height.  Pt propels mod I to room.  Pt left oob in wc w/alarm belt set and needs in reach    Therapy Documentation Precautions:  Precautions Precautions: Fall, Back Precaution Booklet Issued: No Precaution Comments: reviewed back precautions Required Braces or Orthoses: Spinal Brace Spinal Brace: Thoracolumbosacral orthotic, Applied in sitting position Spinal Brace Comments: TLSO when OOB only Restrictions Weight Bearing Restrictions: No     Therapy/Group: Individual Therapy BCallie Fielding POh[place]10/27/2022, 12:16 PM

## 2021-04-23 NOTE — Progress Notes (Signed)
Occupational Therapy Session Note  Patient Details  Name: Marco Kennedy MRN: [id number] Date of Birth: 03/18/1951  Today's Date: 04/24/2021 OT Individual Time: 1093-2355 OT Individual Time Calculation (min): 55 min    Short Term Goals: Week 1:  OT Short Term Goal 1 (Week 1): Patient will don UB clothing seated EOB with Min A. OT Short Term Goal 1 - Progress (Week 1): Progressing toward goal OT Short Term Goal 2 (Week 1): Patient will complete squat-pivot transfers with Mod A +2. OT Short Term Goal 2 - Progress (Week 1): Progressing toward goal OT Short Term Goal 3 (Week 1): Patient will don LB clothing at bed level with Mod A of 1 helper and AD PRN. OT Short Term Goal 3 - Progress (Week 1): Progressing toward goal  Skilled Therapeutic Interventions/Progress Updates:    Pt greeted in bed and premedicated for pain. Stated nursing gave him quite a few laxatives last night. Pt agreeable to sit on the toilet a bit at start of session to ensure full elimination. Min A of 1 for supine<sit with 2nd helper present for safety. Once TLSO was donned, pt completed sit<stand in bariatric Stedy with close supervision where he then transferred to the toilet. Due to body habitus and elevated toilet component, used a urinal for voiding bladder. Pt ultimately unable to void bowels, did pass a lot of gas though. Close supervision for sit<stand from elevated toilet during perihygiene/toileting tasks using Stedy. Pt then transferred to the w/c and engaged in UB bathing/dressing while sitting at the sink given setup assistance. He used the reacher with vcs and Min A to thread LEs into pants and then Max A x2 for sit<stand using RW with both knees blocked to pull pants up past hips. We celebrated this accomplishment as pt has never completed full sit<stand from the w/c before! Setup for oral care and shaving tasks. Pt remained sitting up at close of session, all needs within reach and safety belt fastened. Tx focus  placed on adaptive self care skills, sit<stands, balance, and activity tolerance.   Therapy Documentation Precautions:  Precautions Precautions: Fall, Back Precaution Booklet Issued: No Precaution Comments: reviewed back precautions Required Braces or Orthoses: Spinal Brace Spinal Brace: Thoracolumbosacral orthotic, Applied in sitting position Spinal Brace Comments: TLSO when OOB only Restrictions Weight Bearing Restrictions: No    ADL: ADL Eating: Set up Grooming: Setup Where Assessed-Grooming: Edge of bed (sitting EOB with Min to Mod A to maintain static sitting balance) Upper Body Bathing: Moderate assistance Where Assessed-Upper Body Bathing: Bed level Lower Body Bathing: Dependent Where Assessed-Lower Body Bathing: Bed level Upper Body Dressing: Maximal assistance Where Assessed-Upper Body Dressing: Edge of bed Lower Body Dressing: Dependent Where Assessed-Lower Body Dressing: Bed level Toileting: Setup Where Assessed-Toileting: Other (Comment) (Urinal) Toilet Transfer Method: Unable to assess Tub/Shower Transfer: Unable to assess          :       Therapy/Group: Individual Therapy  Juanita Streight A Lain Tetterton 04/24/2021, 12:24 PM

## 2021-04-24 NOTE — Progress Notes (Signed)
PROGRESS NOTE   Subjective/Complaints:  Able to sit up in bed on own today- pain getting better/still slowly- explained that likely won't be able ot extend much past 11/1- since will be here >30 days- which is normal.   Wearing CPAP at night.   ROS:   Pt denies SOB, abd pain, CP, N/V/C/D, and vision changes   Objective:   No results found. No results for input(s): WBC, HGB, HCT, PLT in the last 72 hours.   No results for input(s): NA, K, CL, CO2, GLUCOSE, BUN, CREATININE, CALCIUM in the last 72 hours.    Intake/Output Summary (Last 24 hours) at 04/24/2021 0919 Last data filed at 04/24/2021 6579 Gross per 24 hour  Intake 840 ml  Output 305 ml  Net 535 ml     Pressure Injury 04/10/21 Buttocks Left;Posterior;Proximal Stage 1 -  Intact skin with non-blanchable redness of a localized area usually over a bony prominence. (Active)  04/10/21   Location: Buttocks  Location Orientation: Left;Posterior;Proximal  Staging: Stage 1 -  Intact skin with non-blanchable redness of a localized area usually over a bony prominence.  Wound Description (Comments):   Present on Admission:     Physical Exam: Vital Signs Blood pressure 133/80, pulse 77, temperature (!) 97.4 F (36.3 C), temperature source Oral, resp. rate 18, height 5\' 8"  (1.727 m), weight 112.1 kg, SpO2 100 %.         General: awake, alert, appropriate, sitting up in bed; NAD HENT: conjugate gaze; oropharynx moist CV: regular rate; no JVD Pulmonary: CTA B/L; no W/R/R- good air movement GI: soft, NT, ND, (+)BS Psychiatric: appropriate- flat more interactive Neurological: Ox3; alert Decreased sensation from thoracic down to feet B/L- worse thighs and down.  Skin: Warm and dry.  Sacrum not examined today. Psych: Normal mood.  Normal behavior. Musc: Lower extremity edema.  No tenderness in extremities. Neuro: Alert Motor: Bilateral upper extremities: 5/5  proximal distal Bilateral lower extremities: 4/5 proximal distal, stable  Assessment/Plan: 1. Functional deficits which require 3+ hours per day of interdisciplinary therapy in a comprehensive inpatient rehab setting. Physiatrist is providing close team supervision and 24 hour management of active medical problems listed below. Physiatrist and rehab team continue to assess barriers to discharge/monitor patient progress toward functional and medical goals  Care Tool:  Bathing    Body parts bathed by patient: Right arm, Left arm, Chest, Abdomen, Face, Front perineal area, Right upper leg, Left upper leg   Body parts bathed by helper: Buttocks, Right lower leg, Left lower leg     Bathing assist Assist Level: Moderate Assistance - Patient 50 - 74%     Upper Body Dressing/Undressing Upper body dressing   What is the patient wearing?: Pull over shirt, Orthosis    Upper body assist Assist Level: Moderate Assistance - Patient 50 - 74%    Lower Body Dressing/Undressing Lower body dressing      What is the patient wearing?: Pants     Lower body assist Assist for lower body dressing: Moderate Assistance - Patient 50 - 74% (rolling bed level, with AE to thread)     Toileting Toileting    Toileting assist Assist for toileting:  Dependent - Patient 0% (bed pan, bed level) Assistive Device Comment: able to manage urinal with set-up assist; reports need for suppository for BM   Transfers Chair/bed transfer  Transfers assist     Chair/bed transfer assist level: Contact Guard/Touching assist (SB transfer)     Locomotion Ambulation   Ambulation assist   Ambulation activity did not occur: Safety/medical concerns          Walk 10 feet activity   Assist  Walk 10 feet activity did not occur: Safety/medical concerns        Walk 50 feet activity   Assist Walk 50 feet with 2 turns activity did not occur: Safety/medical concerns         Walk 150 feet  activity   Assist Walk 150 feet activity did not occur: Safety/medical concerns         Walk 10 feet on uneven surface  activity   Assist Walk 10 feet on uneven surfaces activity did not occur: Safety/medical concerns         Wheelchair     Assist Is the patient using a wheelchair?: Yes Type of Wheelchair: Manual    Wheelchair assist level: Supervision/Verbal cueing Max wheelchair distance: 250 ft    Wheelchair 50 feet with 2 turns activity    Assist        Assist Level: Supervision/Verbal cueing   Wheelchair 150 feet activity     Assist      Assist Level: Supervision/Verbal cueing   Blood pressure 133/80, pulse 77, temperature (!) 97.4 F (36.3 C), temperature source Oral, resp. rate 18, height 5\' 8"  (1.727 m), weight 112.1 kg, SpO2 100 %.  Medical Problem List and Plan: 1.  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 05/31/2021.  TLSO back brace when out of bed. JP drain removed 10/7.2022 D/c date 11/11- will determine next week of needs to move  Con't PT and OT- since already been here 21 days- don't think will be able to extend 2.  Impaired mobility: continue lovenox.              -antiplatelet therapy: N/A 3. Postoperative spinal pain: Continue Voltaren 75 mg twice daily, Robaxin and oxycodone as needed. Added back dilaudid 1mg  q6H prn given severity of pain.   10/11-started ms contin 15 mg BID and stop Dliaudid and con't oxycodone- also add Cymbalta 30 mg QHS for nerve pain and mood.  10/20- increased Oxy to 10-15 mg q4 hours helped  Controlled with meds on 10/22 10/24- pt reports pain a little bette,r but still  limiting- con't regimen for now  10/26- will change MS Contin to 6am and 6pm- see if that helps pain anymore for Am therapy esp  10/27- will let me know in AM if helped- if not, will need to ramp up meds some.   10/28- will not make changes to meds- per d/w pt.  4. Mood: Provide emotional support  10/10- pt appears depressed- will d/w pt in AM about starting something that could also help pain.   10/11-added cymbalta 30 mg QHS. 10/17-increased Cymbalta to 60 mg QHS 10/26- mood brighter- con't regimen             -antipsychotic agents: N/A 5. Neuropsych: This patient  is capable of making decisions on his own behalf. 6. Skin/Wound Care: Routine skin checks 7. Fluids/Electrolytes/Nutrition: Routine in and outs 8.  ID.  Transitioned from intravenous cefazolin to amoxicillin.    Check with ID when to stop Aomoxicllin- ~ 10/20- per chart-  call Dr Dorna Bloom or ID 9.  Acute blood loss anemia.  Latest hemoglobin 8.4.  Follow-up CBC Monday  Hemoglobin 9.3 on 10/17 10.  Hyponatremia.  Sodium 136 on 10/10 labs ordered for Monday 11.  Hypertension.   Continue Norvasc to 10 mg daily  10/28- BP controlled- con't regimen 12.  Irritable bowel syndrome.  Amitiza 24 mcg twice daily 13.  History of gout.  Continue colchicine 14.  OSA.  CPAP  10/26- getting CPAP nightly- con't regimen 15.  Obesity.  Dietary follow-up 16.  Hypoalbuminemia  Supplement initiated on 10/15 17.  Drug-induced constipation 10/24- no BM since 10/20- will give sorbitol and give enema if need be.   10/25- 2 Bms after intervention- con't regimen  10/27- LBM yesterday- going better- con't regimen  10/28- Bowels working better- con't regimen   LOS: 21 days A FACE TO FACE EVALUATION WAS PERFORMED  Marco Kennedy 04/24/2021, 9:19 AM

## 2021-04-24 NOTE — Progress Notes (Signed)
Physical Therapy Session Note  Patient Details  Name: Marco Kennedy MRN: [id number] Date of Birth: September 17, 1950  Today's Date: 04/24/2021 PT Individual Time: 6503-5465 PT Individual Time Calculation (min): 59 min   Short Term Goals: Week 1:  PT Short Term Goal 1 (Week 1): Pt will tolerate sitting upright x 5 min with no more than 5/10 pain PT Short Term Goal 1 - Progress (Week 1): Met PT Short Term Goal 2 (Week 1): Pt will initiate w/c mobility PT Short Term Goal 2 - Progress (Week 1): Met PT Short Term Goal 3 (Week 1): Pt will perform bed mobility with mod A consistently adhering to back precautions PT Short Term Goal 3 - Progress (Week 1): Met Week 2:  PT Short Term Goal 1 (Week 2): Pt will perform stand pivot transfer with RW PT Short Term Goal 1 - Progress (Week 2): Progressing toward goal PT Short Term Goal 2 (Week 2): Pt will perform STS with min A PT Short Term Goal 2 - Progress (Week 2): Progressing toward goal PT Short Term Goal 3 (Week 2): Pt will initiate gait training PT Short Term Goal 3 - Progress (Week 2): Not met Week 3:  PT Short Term Goal 1 (Week 3): Pt will perform stand pivot transfer PT Short Term Goal 2 (Week 3): pt will perform car transfer with any level of assist PT Short Term Goal 3 (Week 3): Pt will perform STS with min A  Skilled Therapeutic Interventions/Progress Updates:    Session 1:  Pt seated in w/c on arrival and agreeable to therapy. Pt reports 4/10 pain, no noted increase with activity. Pt propelled w/c with BUE to day room and throughout session while participating in fall festival activities. Sit to stand with stedy x 2 with light CGA. Pt sat in stedy perch for incr weight bearing x ~30 min while playing ring toss and corn hole, including reaching outside BOS for rings/bean bags, with CGA-supervision. Pt interacted with therapy dog for improved mood. Pt participated in "bobbing for apples" with reacher for improved dexterity with AE during ADLs. Pt  also participated in gratitude activity for holistic pain management and improvement in mood. Pt rd was left with all needs in reach and alarm active.    Session 2 Pt seated in w/c on arrival and agreeable to therapy. Pt reports baseline levels of pain, unrated. Manual therapy provided at end of session for pain relief. Pt propelled w/c with BUE to/from day room for functional mobility and endurance. Squat pivot >mat table with mod A x 2 to L side, mat>w/c with min x 1 to L side. Pt performed Sit to stand x 10 with mod x 2 at knees and for terminal hip extension from elevated mat table. Pt demoes incr difficulty, requiring a higher mat table than previous days. Discussed that pt could be fatigued from heavy work the previous days. Max A x 2 sit>supine on mat table, min A for rolling R and L. Pt performed active assisted hip abduction 2 x 10 with tapping facilitation for improved hip strength. Pt directed in heel slides with assist to maintain neutral alignment. Pt demoes poor eccentric control, min improvement with cueing. BKFO with LLE for improved lateral control, 2 x 10. Supine>sit with max A x 1. Pt returned to room and remained in w/c, was left with all needs in reach and alarm active.   Therapy Documentation Precautions:  Precautions Precautions: Fall, Back Precaution Booklet Issued: No Precaution Comments: reviewed back precautions  Required Braces or Orthoses: Spinal Brace Spinal Brace: Thoracolumbosacral orthotic, Applied in sitting position Spinal Brace Comments: TLSO when OOB only Restrictions Weight Bearing Restrictions: No General:      Therapy/Group: Individual Therapy  Mickel Fuchs 04/24/2021, 12:13 PM

## 2021-04-25 NOTE — Progress Notes (Signed)
Occupational Therapy Session Note  Patient Details  Name: Marco Kennedy MRN: [id number] Date of Birth: [birth year]  Today's Date: 04/26/2021 OT Individual Time: 1403-1500 OT Individual Time Calculation (min): 57 min   Short Term Goals: Week 1:  OT Short Term Goal 1 (Week 1): Patient will don UB clothing seated EOB with Min A. OT Short Term Goal 1 - Progress (Week 1): Progressing toward goal OT Short Term Goal 2 (Week 1): Patient will complete squat-pivot transfers with Mod A +2. OT Short Term Goal 2 - Progress (Week 1): Progressing toward goal OT Short Term Goal 3 (Week 1): Patient will don LB clothing at bed level with Mod A of 1 helper and AD PRN. OT Short Term Goal 3 - Progress (Week 1): Progressing toward goal  Skilled Therapeutic Interventions/Progress Updates:    Pt greeted in the w/c, wearing TLSO, and premedicated for pain. He had an assisted fall earlier today, no soreness from fall. Pt asking to change shorts as the shorts he was wearing were very "slick" and he was thinking that they were the reason why he fell out of the chair earlier when he was scooting backwards. Attempted sit<stand with RW and +2 assistance but pt unable to achieve full standing position. Therefore Stedy utilized for sit<stand in order to [employer] and don new ones. CGA for sit<stands in [place]. Pt able to use the reacher to thread LEs into new shorts. He then completed oral care, shaving, and UB self care while seated at the sink with setup assistance. Pt reported that his wife is meeting with a contractor soon to make his home more w/c accessible, planning on purchasing the [place] for toileting. He was then escorted via w/c to the dayroom and completed slideboard<mat with Min A. Pt completed 3 sit<stands using RW and B knees blocked with +2 assistance, mat height elevated. Longest standing window ~42 seconds. Afterwards pt completed another slideboard transfer<w/c and was returned to his room. Pt was left  sitting up, all needs within reach and safety belt fastened.    Therapy Documentation Precautions:  Precautions Precautions: Fall, Back Precaution Booklet Issued: No Precaution Comments: reviewed back precautions Required Braces or Orthoses: Spinal Brace Spinal Brace: Thoracolumbosacral orthotic, Applied in sitting position Spinal Brace Comments: TLSO when OOB only Restrictions Weight Bearing Restrictions: No  ADL: ADL Eating: Set up Grooming: Setup Where Assessed-Grooming: Edge of bed (sitting EOB with Min to Mod A to maintain static sitting balance) Upper Body Bathing: Moderate assistance Where Assessed-Upper Body Bathing: Bed level Lower Body Bathing: Dependent Where Assessed-Lower Body Bathing: Bed level Upper Body Dressing: Maximal assistance Where Assessed-Upper Body Dressing: Edge of bed Lower Body Dressing: Dependent Where Assessed-Lower Body Dressing: Bed level Toileting: Setup Where Assessed-Toileting: Other (Comment) (Urinal) Toilet Transfer Method: Unable to assess Tub/Shower Transfer: Unable to assess  Therapy/Group: Individual Therapy  Vinia Jemmott A Arieon Corcoran 04/26/2021, 3:20 PM

## 2021-04-25 NOTE — Progress Notes (Signed)
Slept good. Wore CPAP. Dressing to back C,D, & I. BLE's with + 2 pitting edema. Bilateral lateral heels red, blanchable. elevated Heels off bed with pillows. C/O numbness from mid thighs to feet, not new. No PRN meds given. Alfredo Martinez A

## 2021-04-26 NOTE — Progress Notes (Signed)
Occupational Therapy Session Note  Patient Details  Name: Marco Kennedy MRN: [id number] Date of Birth: 1951/04/29  Today's Date: 04/27/2021 OT Individual Time: 6948-5462 OT Individual Time Calculation (min): 56 min    Short Term Goals: Week 2:  OT Short Term Goal 1 (Week 2): pt will don shirt/TLSO Min A at EOB or up in w/c OT Short Term Goal 1 - Progress (Week 2): Progressing toward goal OT Short Term Goal 2 (Week 2): Pt will perform slide board transfer with Min A in prep for ADL OT Short Term Goal 2 - Progress (Week 2): Met OT Short Term Goal 3 (Week 2): Patient will don LB clothing at bed level with Mod A of 1 helper and AE PRN. OT Short Term Goal 3 - Progress (Week 2): Met OT Short Term Goal 4 (Week 2): Pt will perform sit <> stands in prep for ADL at [place] with  Mod A of 1 OT Short Term Goal 4 - Progress (Week 2): Progressing toward goal  Skilled Therapeutic Interventions/Progress Updates:    Pt greeted in bed and premedicated for pain. Requesting to engage in selected self care tasks at the sink today. Min A for supine<sit with HOB elevated, pt doing well to adhere to his back precautions during transition. TLSO donned while EOB and then pt transferred to the w/c using Stedy due to lack of substantial LB clothing needed for slideboard safety. Close supervision for sit<stand in [place]. Pt completed UB self care and grooming tasks while seated with setup assistance. Afterwards pt completed multiple sit<stands to lower underwear, complete pericare, and don clean underwear + shorts. Heavy +2 assistance for rising into standing with RW, bilateral knees blocked. Pt able to use the reacher to thread LEs into underwear and pants with setup assist. He remained in the w/c at close of session, wearing TLSO, all needs within reach and safety belt fastened.   Therapy Documentation Precautions:  Precautions Precautions: Fall, Back Precaution Booklet Issued: No Precaution Comments: reviewed back  precautions Required Braces or Orthoses: Spinal Brace Spinal Brace: Thoracolumbosacral orthotic, Applied in sitting position Spinal Brace Comments: TLSO when OOB only Restrictions Weight Bearing Restrictions: No  ADL: ADL Eating: Set up Grooming: Setup Where Assessed-Grooming: Edge of bed (sitting EOB with Min to Mod A to maintain static sitting balance) Upper Body Bathing: Moderate assistance Where Assessed-Upper Body Bathing: Bed level Lower Body Bathing: Dependent Where Assessed-Lower Body Bathing: Bed level Upper Body Dressing: Maximal assistance Where Assessed-Upper Body Dressing: Edge of bed Lower Body Dressing: Dependent Where Assessed-Lower Body Dressing: Bed level Toileting: Setup Where Assessed-Toileting: Other (Comment) (Urinal) Toilet Transfer Method: Unable to assess Tub/Shower Transfer: Unable to assess  Therapy/Group: Individual Therapy  Reshard Guillet A Evadean Sproule 04/27/2021, 12:14 PM

## 2021-04-26 NOTE — Progress Notes (Signed)
Pt comfortable placing self on CPAP, 2L of 02 bleed in.

## 2021-04-26 NOTE — Progress Notes (Signed)
PROGRESS NOTE   Subjective/Complaints:  Pt reports no issues- LBM yesterday- very large.  Pt slipped out of w/c per nursing when up- no injuries- had back brace in place. After pt seen.      ROS:   Pt denies SOB, abd pain, CP, N/V/C/D, and vision changes    Objective:   No results found. No results for input(s): WBC, HGB, HCT, PLT in the last 72 hours.   No results for input(s): NA, K, CL, CO2, GLUCOSE, BUN, CREATININE, CALCIUM in the last 72 hours.    Intake/Output Summary (Last 24 hours) at 04/26/2021 1435 Last data filed at 04/26/2021 1330 Gross per 24 hour  Intake 900 ml  Output 1200 ml  Net -300 ml     Pressure Injury 04/10/21 Buttocks Left;Posterior;Proximal Stage 1 -  Intact skin with non-blanchable redness of a localized area usually over a bony prominence. (Active)  04/10/21   Location: Buttocks  Location Orientation: Left;Posterior;Proximal  Staging: Stage 1 -  Intact skin with non-blanchable redness of a localized area usually over a bony prominence.  Wound Description (Comments):   Present on Admission:     Physical Exam: Vital Signs Blood pressure (!) 125/99, pulse 99, temperature 98 F (36.7 C), resp. rate 20, height 5\' 8"  (1.727 m), weight 112.1 kg, SpO2 99 %.          General: awake, alert, appropriate,sitting up in bed; smiling some;  NAD HENT: conjugate gaze; oropharynx moist CV: regular rate; no JVD Pulmonary: CTA B/L; no W/R/R- good air movement GI: soft, NT, ND, (+)BS- protuberant Psychiatric: appropriate-- slightly flat, but interactive Neurological: Ox3 Decreased sensation from thoracic down to feet B/L- worse thighs and down.  Skin: Warm and dry.  Sacrum not examined today. Psych: Normal mood.  Normal behavior. Musc: Lower extremity edema.  No tenderness in extremities. Neuro: Alert Motor: Bilateral upper extremities: 5/5 proximal distal Bilateral lower extremities:  4/5 proximal distal, stable  Assessment/Plan: 1. Functional deficits which require 3+ hours per day of interdisciplinary therapy in a comprehensive inpatient rehab setting. Physiatrist is providing close team supervision and 24 hour management of active medical problems listed below. Physiatrist and rehab team continue to assess barriers to discharge/monitor patient progress toward functional and medical goals  Care Tool:  Bathing    Body parts bathed by patient: Right arm, Left arm, Chest, Abdomen, Face, Front perineal area, Right upper leg, Left upper leg   Body parts bathed by helper: Buttocks, Right lower leg, Left lower leg     Bathing assist Assist Level: Moderate Assistance - Patient 50 - 74%     Upper Body Dressing/Undressing Upper body dressing   What is the patient wearing?: Pull over shirt, Orthosis    Upper body assist Assist Level: Moderate Assistance - Patient 50 - 74%    Lower Body Dressing/Undressing Lower body dressing      What is the patient wearing?: Pants     Lower body assist Assist for lower body dressing: Moderate Assistance - Patient 50 - 74% (rolling bed level, with AE to thread)     Toileting Toileting    Toileting assist Assist for toileting: Dependent - Patient 0% (bed pan,  bed level) Assistive Device Comment: able to manage urinal with set-up assist; reports need for suppository for BM   Transfers Chair/bed transfer  Transfers assist     Chair/bed transfer assist level: Contact Guard/Touching assist (SB transfer)     Locomotion Ambulation   Ambulation assist   Ambulation activity did not occur: Safety/medical concerns          Walk 10 feet activity   Assist  Walk 10 feet activity did not occur: Safety/medical concerns        Walk 50 feet activity   Assist Walk 50 feet with 2 turns activity did not occur: Safety/medical concerns         Walk 150 feet activity   Assist Walk 150 feet activity did not occur:  Safety/medical concerns         Walk 10 feet on uneven surface  activity   Assist Walk 10 feet on uneven surfaces activity did not occur: Safety/medical concerns         Wheelchair     Assist Is the patient using a wheelchair?: Yes Type of Wheelchair: Manual    Wheelchair assist level: Supervision/Verbal cueing Max wheelchair distance: 250 ft    Wheelchair 50 feet with 2 turns activity    Assist        Assist Level: Supervision/Verbal cueing   Wheelchair 150 feet activity     Assist      Assist Level: Supervision/Verbal cueing   Blood pressure (!) 125/99, pulse 99, temperature 98 F (36.7 C), resp. rate 20, height 5\' 8"  (1.727 m), weight 112.1 kg, SpO2 99 %.  Medical Problem List and Plan: 1.  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 Vertell Limber.  TLSO back brace when out of bed. JP drain removed 10/7.2022  -con't PT and OT- don't think willl be able to extend past 11/11- explained to pt.  2.  Impaired mobility: continue lovenox.              -antiplatelet therapy: N/A 3. Postoperative spinal pain: Continue Voltaren 75 mg twice daily, Robaxin and oxycodone as needed. Added back dilaudid 1mg  q6H prn given severity of pain.   10/11-started ms contin 15 mg BID and stop Dliaudid and con't oxycodone- also add Cymbalta 30 mg QHS for nerve pain and mood.  10/20- increased Oxy to 10-15 mg q4 hours helped  Controlled with meds on 10/22 10/24- pt reports pain a little bette,r but still limiting- con't regimen for now  10/26- will change MS Contin to 6am and 6pm- see if that helps pain anymore for Am therapy esp  10/27- will let me know  in AM if helped- if not, will need to ramp up meds some.   10/30- pt wants ot keep pain meds the same for now- con't regimen 4. Mood: Provide emotional support  10/10- pt appears depressed- will d/w pt in AM about starting something that could also help pain.   10/11-added cymbalta 30 mg QHS. 10/17-increased Cymbalta to 60 mg QHS 10/26- mood brighter- con't regimen             -antipsychotic agents: N/A 5. Neuropsych: This patient is capable of making decisions on his own behalf. 6. Skin/Wound Care: Routine skin checks 7. Fluids/Electrolytes/Nutrition: Routine  in and outs 8.  ID.  Transitioned from intravenous cefazolin to amoxicillin.    Check with ID when to stop Aomoxicllin- ~ 10/20- per chart-  call Dr Elba Barman or ID  10/30- labs in AM 9.  Acute blood loss anemia.  Latest hemoglobin 8.4.  Follow-up CBC Monday  Hemoglobin 9.3 on 10/17  10/30- labs in AM 10.  Hyponatremia.  Sodium 136 on 10/10   10/30- labs in AM 11.  Hypertension.   Continue Norvasc to 10 mg daily  10/28- BP controlled- con't regimen 12.  Irritable bowel syndrome.  Amitiza 24 mcg twice daily 13.  History of gout.  Continue colchicine 14.  OSA.  CPAP  10/26- getting CPAP nightly- con't regimen 15.  Obesity.  Dietary follow-up 16.  Hypoalbuminemia  Supplement initiated on 10/15 17.  Drug-induced constipation 10/24- no BM since 10/20- will give sorbitol and give enema if need be.   10/25- 2 Bms after intervention- con't regimen  10/27- LBM yesterday- going better- con't regimen  10/28- Bowels working better- con't regimen  10/30- LBM yesterday   LOS: 23 days A FACE TO FACE EVALUATION WAS PERFORMED  Marco Kennedy 04/26/2021, 2:35 PM

## 2021-04-27 LAB — CBC WITH DIFFERENTIAL/PLATELET
Abs Immature Granulocytes: 0.01 10*3/uL (ref 0.00–0.07)
Basophils Absolute: 0 10*3/uL (ref 0.0–0.1)
Basophils Relative: 0 %
Eosinophils Absolute: 0.2 10*3/uL (ref 0.0–0.5)
Eosinophils Relative: 4 %
HCT: 32.3 % — ABNORMAL LOW (ref 39.0–52.0)
Hemoglobin: 10.1 g/dL — ABNORMAL LOW (ref 13.0–17.0)
Immature Granulocytes: 0 %
Lymphocytes Relative: 21 %
Lymphs Abs: 1 10*3/uL (ref 0.7–4.0)
MCH: 28.2 pg (ref 26.0–34.0)
MCHC: 31.3 g/dL (ref 30.0–36.0)
MCV: 90.2 fL (ref 80.0–100.0)
Monocytes Absolute: 0.5 10*3/uL (ref 0.1–1.0)
Monocytes Relative: 11 %
Neutro Abs: 2.9 10*3/uL (ref 1.7–7.7)
Neutrophils Relative %: 64 %
Platelets: 323 10*3/uL (ref 150–400)
RBC: 3.58 MIL/uL — ABNORMAL LOW (ref 4.22–5.81)
RDW: 15.1 % (ref 11.5–15.5)
WBC: 4.6 10*3/uL (ref 4.0–10.5)
nRBC: 0 % (ref 0.0–0.2)

## 2021-04-27 LAB — BASIC METABOLIC PANEL WITH GFR
Anion gap: 9 (ref 5–15)
BUN: 19 mg/dL (ref 8–23)
CO2: 28 mmol/L (ref 22–32)
Calcium: 8.9 mg/dL (ref 8.9–10.3)
Chloride: 100 mmol/L (ref 98–111)
Creatinine, Ser: 0.69 mg/dL (ref 0.61–1.24)
GFR, Estimated: >60 mL/min (ref >60)
Glucose, Bld: 91 mg/dL (ref 70–99)
Potassium: 4.4 mmol/L (ref 3.5–5.1)
Sodium: 137 mmol/L (ref 135–145)

## 2021-04-27 LAB — SEDIMENTATION RATE: Sed Rate: 35 mm/hr — ABNORMAL HIGH (ref 0–16)

## 2021-04-27 LAB — C-REACTIVE PROTEIN: CRP: 0.5 mg/dL

## 2021-04-27 NOTE — Progress Notes (Signed)
**  Late Entry**   04/26/21 1030  What Happened  Was fall witnessed? Yes  Who witnessed fall? Debbe Odea, NT  Patients activity before fall other (comment) (scooting himself up in his wheelchair)  Point of contact buttocks  Was patient injured? No  Follow Up  MD notified Dr Berline Chough  Time MD notified [phone]  Family notified No - patient refusal  Additional tests No  Simple treatment Other (comment) (none)  Adult Fall Risk Assessment  Risk Factor Category (scoring not indicated) Fall has occurred during this admission (document High fall risk)  Patient Fall Risk Level High fall risk  Adult Fall Risk Interventions  Required Bundle Interventions *See Row Information* High fall risk - low, moderate, and high requirements implemented  Additional Interventions Family Supervision;Lap belt while in chair/wheelchair (Rehab only);Use of appropriate toileting equipment (bedpan, BSC, etc.)  Screening for Fall Injury Risk (To be completed on HIGH fall risk patients) - Assessing Need for Floor Mats  Risk For Fall Injury- Criteria for Floor Mats None identified - No additional interventions needed  Will Implement Floor Mats Yes  Vitals  Temp 98.4 F (36.9 C)  Temp Source Oral  BP (!) 145/80  BP Location Left Arm  BP Method Automatic  Patient Position (if appropriate) Sitting  Pulse Rate 80  Pulse Rate Source Left;Radial  Resp 18  Oxygen Therapy  SpO2 100 %  O2 Device Room Air  Pain Assessment  Pain Scale 0-10  Pain Score 0  Neurological  Neuro (WDL) X  Level of Consciousness Alert  Orientation Level Oriented X4  Cognition Appropriate at baseline  Speech Clear  Motor Function/Sensation Assessment Grip;Motor response;Sensation;Motor strength  R Hand Grip Moderate  L Hand Grip Moderate   RUE Motor Response Purposeful movement  RUE Sensation Full sensation  RUE Motor Strength 4  LUE Motor Response Purposeful movement  LUE Sensation Full sensation  LUE Motor Strength 4  RLE Motor  Response Purposeful movement  RLE Sensation Decreased  RLE Motor Strength 4  LLE Motor Response Purposeful movement  LLE Sensation Full sensation  LLE Motor Strength 4  Neuro Symptoms None  Musculoskeletal  Musculoskeletal (WDL) X  Assistive Device Wheelchair;Stedy  Generalized Weakness Yes  Weight Bearing Restrictions No  Integumentary  Integumentary (WDL) X  Skin Color Appropriate for ethnicity  Skin Condition Dry  Skin Integrity Surgical Incision (see LDA)  Skin Turgor Non-tenting

## 2021-04-27 NOTE — Progress Notes (Signed)
Pt able to place self on CPAP w/out assistance. RT set machine up for pt on auto titrate max 12 min 4 w/2 LPM bled into the system. RT will continue to monitor.

## 2021-04-27 NOTE — Progress Notes (Signed)
Physical Therapy Session Note  Patient Details  Name: Marco Kennedy MRN: [id number] Date of Birth: 01-Jul-1950  Today's Date: 04/27/2021 PT Individual Time: 1530-1610 PT Individual Time Calculation (min): 40 min   Short Term Goals: Week 3:  PT Short Term Goal 1 (Week 3): Pt will perform stand pivot transfer PT Short Term Goal 2 (Week 3): pt will perform car transfer with any level of assist PT Short Term Goal 3 (Week 3): Pt will perform STS with min A  Skilled Therapeutic Interventions/Progress Updates:    Pt received seated in w/c in room, agreeable to PT session. Pt reports pain is well-controlled this PM. Manual w/c propulsion 2 x 150 ft with use of BUE at Supervision level. Slide board transfer w/c to/from mat table with min A, several scoots required to cover distance. Sit to stand x 4 reps to RW from slightly elevated mat table, B knees blocked anteriorly with assist from a 2nd person to boost to standing. Pt tolerates standing up to 1 min 30 sec at most. Pt exhibits heavy UE reliance in standing with B shoulder elevation noted while holding onto to RW. Pt able to perform lateral weight shifts in standing, increase in knee buckling with weight shifts. Pt left seated in w/c in room with needs in reach, quick release belt and chair alarm in place at end of session.  Therapy Documentation Precautions:  Precautions Precautions: Fall, Back Precaution Booklet Issued: No Precaution Comments: reviewed back precautions Required Braces or Orthoses: Spinal Brace Spinal Brace: Thoracolumbosacral orthotic, Applied in sitting position Spinal Brace Comments: TLSO when OOB only Restrictions Weight Bearing Restrictions: No     Therapy/Group: Individual Therapy   Peter Congo, PT, DPT, CSRS  04/27/2021, 5:08 PM

## 2021-04-27 NOTE — Progress Notes (Signed)
Physical Therapy Session Note  Patient Details  Name: Marco Kennedy MRN: [id number] Date of Birth: [birth year]  Today's Date: 04/27/2021 PT Group Time:  -     Short Term Goals: Week 1:  PT Short Term Goal 1 (Week 1): Pt will tolerate sitting upright x 5 min with no more than 5/10 pain PT Short Term Goal 1 - Progress (Week 1): Met PT Short Term Goal 2 (Week 1): Pt will initiate w/c mobility PT Short Term Goal 2 - Progress (Week 1): Met PT Short Term Goal 3 (Week 1): Pt will perform bed mobility with mod A consistently adhering to back precautions PT Short Term Goal 3 - Progress (Week 1): Met Week 2:  PT Short Term Goal 1 (Week 2): Pt will perform stand pivot transfer with RW PT Short Term Goal 1 - Progress (Week 2): Progressing toward goal PT Short Term Goal 2 (Week 2): Pt will perform STS with min A PT Short Term Goal 2 - Progress (Week 2): Progressing toward goal PT Short Term Goal 3 (Week 2): Pt will initiate gait training PT Short Term Goal 3 - Progress (Week 2): Not met Week 3:  PT Short Term Goal 1 (Week 3): Pt will perform stand pivot transfer PT Short Term Goal 2 (Week 3): pt will perform car transfer with any level of assist PT Short Term Goal 3 (Week 3): Pt will perform STS with min A  Skilled Therapeutic Interventions/Progress Updates:    Pt seated in w/c on arrival and agreeable to therapy. Pt reports no change in pain levels, unrated and premedicated. Pt propelled w/c with BUE to/from therapy gym for improved endurance and functional mobility. Session focused on improving LE strength with standing exercise in //bars. Pt performed Sit to stand  x 2 from w/c to bars with mod A x 2 for terminal hip and knee extension. Pt directed in weight shifting in standing to improve single leg stance stability and prepare for gait. Pt then performed squats, 3 x 8 with mod-max A at times. Seated rest break between, each stand with mod to as little as min A of 1 x 1 stand. Pt required  extended rest breaks between activities  for fatigue and pain management. Pt returned to room and was left with all needs in reach and alarm active.   Therapy Documentation Precautions:  Precautions Precautions: Fall, Back Precaution Booklet Issued: No Precaution Comments: reviewed back precautions Required Braces or Orthoses: Spinal Brace Spinal Brace: Thoracolumbosacral orthotic, Applied in sitting position Spinal Brace Comments: TLSO when OOB only Restrictions Weight Bearing Restrictions: No    Therapy/Group: Individual Therapy  Mickel Fuchs 04/27/2021, 12:20 PM

## 2021-04-27 NOTE — Progress Notes (Signed)
PROGRESS NOTE   Subjective/Complaints: Says that overall pain has improved. Still has a lot of weakness and numbness in legs however.  ROS: Patient denies fever, rash, sore throat, blurred vision, nausea, vomiting, diarrhea, cough, shortness of breath or chest pain, joint or back pain, headache, or mood change.     Objective:   No results found. Recent Labs    04/27/21 0549  WBC 4.6  HGB 10.1*  HCT 32.3*  PLT 323     Recent Labs    04/27/21 0549  NA 137  K 4.4  CL 100  CO2 28  GLUCOSE 91  BUN 19  CREATININE 0.69  CALCIUM 8.9      Intake/Output Summary (Last 24 hours) at 04/27/2021 1138 Last data filed at 04/27/2021 0855 Gross per 24 hour  Intake 480 ml  Output 1100 ml  Net -620 ml     Pressure Injury 04/10/21 Buttocks Left;Posterior;Proximal Stage 1 -  Intact skin with non-blanchable redness of a localized area usually over a bony prominence. (Active)  04/10/21   Location: Buttocks  Location Orientation: Left;Posterior;Proximal  Staging: Stage 1 -  Intact skin with non-blanchable redness of a localized area usually over a bony prominence.  Wound Description (Comments):   Present on Admission:     Physical Exam: Vital Signs Blood pressure (!) 154/87, pulse 97, temperature 98.3 F (36.8 C), temperature source Oral, resp. rate 18, height 5\' 8"  (1.727 m), weight 112.1 kg, SpO2 100 %.  Constitutional: No distress . Vital signs reviewed. HEENT: NCAT, EOMI, oral membranes moist Neck: supple Cardiovascular: RRR without murmur. No JVD    Respiratory/Chest: CTA Bilaterally without wheezes or rales. Normal effort    GI/Abdomen: BS +, non-tender, non-distended Ext: no clubbing, cyanosis, or edema Psych: pleasant and cooperative  Neurological: Ox3 Decreased sensation from thoracic down to feet B/L- worse thighs and down, esp feet.  Skin: Warm and dry.  Sacrum not seen today Psych: Normal mood.  Normal  behavior. Musc: Lower extremity edema.  No tenderness in extremities. Neuro: Alert Motor: Bilateral upper extremities: 5/5 proximal distal Bilateral lower extremities: 4/5 proximal distal,--no changes  Assessment/Plan: 1. Functional deficits which require 3+ hours per day of interdisciplinary therapy in a comprehensive inpatient rehab setting. Physiatrist is providing close team supervision and 24 hour management of active medical problems listed below. Physiatrist and rehab team continue to assess barriers to discharge/monitor patient progress toward functional and medical goals  Care Tool:  Bathing    Body parts bathed by patient: Right arm, Left arm, Chest, Abdomen, Face, Front perineal area, Right upper leg, Left upper leg   Body parts bathed by helper: Buttocks, Right lower leg, Left lower leg     Bathing assist Assist Level: Moderate Assistance - Patient 50 - 74%     Upper Body Dressing/Undressing Upper body dressing   What is the patient wearing?: Pull over shirt, Orthosis    Upper body assist Assist Level: Moderate Assistance - Patient 50 - 74%    Lower Body Dressing/Undressing Lower body dressing      What is the patient wearing?: Pants     Lower body assist Assist for lower body dressing: Moderate Assistance -  Patient 50 - 74% (rolling bed level, with AE to thread)     Toileting Toileting    Toileting assist Assist for toileting: Dependent - Patient 0% (bed pan, bed level) Assistive Device Comment: able to manage urinal with set-up assist; reports need for suppository for BM   Transfers Chair/bed transfer  Transfers assist     Chair/bed transfer assist level: Contact Guard/Touching assist (SB transfer)     Locomotion Ambulation   Ambulation assist   Ambulation activity did not occur: Safety/medical concerns          Walk 10 feet activity   Assist  Walk 10 feet activity did not occur: Safety/medical concerns        Walk 50 feet  activity   Assist Walk 50 feet with 2 turns activity did not occur: Safety/medical concerns         Walk 150 feet activity   Assist Walk 150 feet activity did not occur: Safety/medical concerns         Walk 10 feet on uneven surface  activity   Assist Walk 10 feet on uneven surfaces activity did not occur: Safety/medical concerns         Wheelchair     Assist Is the patient using a wheelchair?: Yes Type of Wheelchair: Manual    Wheelchair assist level: Supervision/Verbal cueing Max wheelchair distance: 250 ft    Wheelchair 50 feet with 2 turns activity    Assist        Assist Level: Supervision/Verbal cueing   Wheelchair 150 feet activity     Assist      Assist Level: Supervision/Verbal cueing   Blood pressure (!) 154/87, pulse 97, temperature 98.3 F (36.8 C), temperature source Oral, resp. rate 18, height 5\' 8"  (1.727 m), weight 112.1 kg, SpO2 100 %.  Medical Problem List and Plan: 1.  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 Vertell Limber.  TLSO back brace when out of bed. JP drain removed 10/7.2022 -Continue CIR therapies including PT, OT  - don't think willl be able to extend past 11/11- has been explained to pt.  2.  Impaired mobility: continue lovenox.              -antiplatelet therapy: N/A 3. Postoperative spinal pain: Continue Voltaren 75 mg twice daily, Robaxin and oxycodone as needed. Added back dilaudid 1mg  q6H prn given severity of pain.   10/11-started ms contin 15 mg BID and stop Dliaudid and con't oxycodone- also add Cymbalta 30 mg QHS for nerve pain and mood.  10/20- increased Oxy to 10-15 mg q4 hours  helped  Controlled with meds on 10/22 10/24- pt reports pain a little bette,r but still limiting- con't regimen for now  10/26- will change MS Contin to 6am and 6pm- see if that helps pain anymore for Am therapy esp  10/27- will let me know in AM if helped- if not, will need to ramp up meds some.   10/31- pt reports reasonable control- con't regimen 4. Mood: Provide emotional support  10/10- pt appears depressed- will d/w pt in AM about starting something that could also help pain.   10/11-added cymbalta 30 mg QHS. 10/17-increased Cymbalta to 60 mg QHS 10/26- mood brighter- con't regimen             -  antipsychotic agents: N/A 5. Neuropsych: This patient is capable of making decisions on his own behalf. 6. Skin/Wound Care: Routine skin checks 7. Fluids/Electrolytes/Nutrition: Routine in and outs 8.  ID.  Transitioned from intravenous cefazolin to amoxicillin.    Check with ID when to stop Aomoxicllin- ~ 10/20- per chart-  call Dr Dorna Bloom or ID  10/31 wbc's 4.6 9.  Acute blood loss anemia.  Latest hemoglobin 8.4.  Follow-up CBC Monday  Hemoglobin 9.3 on 10/17  10/30- labs in AM 10.  Hyponatremia.  Sodium 136 on 10/10   10/31: 137 11.  Hypertension.   Continue Norvasc to 10 mg daily  10/31- BP borderline controlled- con't regimen 12.  Irritable bowel syndrome.  Amitiza 24 mcg twice daily 13.  History of gout.  Continue colchicine 14.  OSA.  CPAP  10/26- getting CPAP nightly- con't regimen 15.  Obesity.  Dietary follow-up 16.  Hypoalbuminemia  Supplement initiated on 10/15 17.  Drug-induced constipation 10/24- no BM since 10/20- will give sorbitol and give enema if need be.   10/25- 2 Bms after intervention- con't regimen  10/27- LBM yesterday- going better- con't regimen  10/28- Bowels working better- con't regimen  10/31- LBM today   LOS: 24 days A FACE TO FACE EVALUATION WAS PERFORMED  Ranelle Oyster 04/27/2021, 11:38 AM

## 2021-04-27 NOTE — Progress Notes (Signed)
Occupational Therapy Session Note  Patient Details  Name: Marco Kennedy MRN: [id number] Date of Birth: [birth year]  Today's Date: 04/27/2021 OT Individual Time: 0204-0258 OT Individual Time Calculation (min): 54 min    Short Term Goals: Week 3:  OT Short Term Goal 1 (Week 3): Pt will perform squat pivot transfers to BSC/toilet with Mod A x2 OT Short Term Goal 2 (Week 3): Pt will perform LB dress Min A with AE PRN bed level OT Short Term Goal 3 (Week 3): pt will don TLSO Min A at EOB  Skilled Therapeutic Interventions/Progress Updates:    Pt greeted at time of session up in wheelchair agreeable to OT session, no pain reported throughout session. Pt transported to gym and focused on squat pivot w/c <> mat with 2 assist, Max A overall needing cues as well for hand placement and physical assist to manage BLEs. On EOM, focused on sit <> stands x2 with again 2 helpers to provide Max A posteriorly and 2nd helper to block knees in front, able to stand for 15-30 second intervals and mild lateral weight shift at RW. Squat pivot back to wheelchair same manner as above. Focused remainder of session on dynamic sitting balance/core strengthening with overhead ball tosses for 2x20-25 reps in varying directions. UB strengthening with 4# dowel for 2x20 each: bicep curl, chest press, overhead press. Pt transported back to room and up in chair with alarm on call bell in reach.   Therapy Documentation Precautions:  Precautions Precautions: Fall, Back Precaution Booklet Issued: No Precaution Comments: reviewed back precautions Required Braces or Orthoses: Spinal Brace Spinal Brace: Thoracolumbosacral orthotic, Applied in sitting position Spinal Brace Comments: TLSO when OOB only Restrictions Weight Bearing Restrictions: No     Therapy/Group: Individual Therapy  Erasmo Score 04/27/2021, 12:41 PM

## 2021-04-28 NOTE — Progress Notes (Signed)
[name], male   DOB: 1951/06/03, 70 y.o.   MRN: [id number]  Met with pt to discuss team conference progress this week and discharge still 11/11. Have scheduled family education with wife for Tuesday 11/8 at 9:00-11:00. One of the main issues is getting in and out of the car. Pt is aware of this and reports working on this this week. His wife will be meeting with home inspector on Thursday and hopes it goes well and they will be able to move into the home they ate building which is handicapped accessible. Work on discharge needs.

## 2021-04-28 NOTE — Patient Care Conference (Signed)
Inpatient RehabilitationTeam Conference and Plan of Care Update Date: 04/28/2021   Time: 11:39 AM    Patient Name: Marco Kennedy      Medical Record Number: [id number]  Date of Birth: 1951-03-01 Sex: Male         Room/Bed: 4W06C/4W06C-01 Payor Info: Payor: BLUE CROSS BLUE SHIELD / Plan: BCBS COMM PPO / Product Type: *No Product type* /    Admit Date/Time:  04/03/2021  2:44 PM  Primary Diagnosis:  Epidural abscess  Hospital Problems: Principal Problem:   Epidural abscess Active Problems:   Hypoalbuminemia due to protein-calorie malnutrition (HCC)   Essential hypertension   Acute blood loss anemia   Labile blood pressure   Postoperative pain   Pressure injury of skin   Drug induced constipation    Expected Discharge Date: Expected Discharge Date: 05/08/21  Team Members Present: Physician leading conference: Dr. Genice Rouge Social Worker Present: Dossie Der, LCSW Nurse Present: Kennyth Arnold, RN PT Present: Bernie Covey, PT OT Present: Earleen Newport, OT PPS Coordinator present : Fae Pippin, SLP     Current Status/Progress Goal Weekly Team Focus  Bowel/Bladder   Continent of B/B. LBM 04/27/21  Remain Continent.      Swallow/Nutrition/ Hydration             ADL's   Min slide board, Max squat pivot, sit <> stand at [employer] A with 2nd helper to block knees, BLEs very weak, Mod/Max LB bathe/dress, potentially purchase stedy  Min A overall  ADL retraining, AE training, TLSO, squat pivot transfers, [employer] training, family ed, DC planning   Mobility   CGA bed mobiltiy  min A transfers, mod I w/c  Transfers   Communication             Safety/Cognition/ Behavioral Observations            Pain   Prn Pain Meds For Shoulder Pian  Pain<3. Assess Q shift and prn.      Skin   Surgical Incision Mid-Back  No New Skin Breakdown  Assess Q Shift and prn.     Discharge Planning:  Pt continues to be motivated and making slow gains-aware goals of min assist. Asking for an  extension to continue progress.   Team Discussion: Been here 25 days, 11 days until discharge date, can't extend. MS Contin changed to 6 AM and 6 PM. Constipation is variable. Incision clean, possible hemorrhoids. Has foot drop, will check to see if he has Prafo boots, if not will order. Fall on 04/26/21, no injury. ? Applying TLSO himself. Will schedule family education next week. Legs are very weak. Made slow progress, not very functionale. Can go home at [facility] level. Patient on target to meet rehab goals: yes, slow progress. Squat pivots improving. Doing slide board transfers.  *See Care Plan and progress notes for long and short-term goals.   Revisions to Treatment Plan:  Medication adjustments, family meeting scheduled  Teaching Needs: Family education, transfers, slide board, medication/pain management, safety awareness, skin/wound care.  Current Barriers to Discharge: Decreased caregiver support, Home enviroment access/layout, Wound care, Weight, and Weight bearing restrictions  Possible Resolutions to Barriers: Family education Has DME already Schedule family meeting Order Prafo boots if indicated     Medical Summary Current Status: conitnent of B/B- LBM 10/31- MS contin 15 mg BID 6am/6pm- and oxycodne prn; foot drop- needs PRAFOs?- hemorrhoids- fall on 10/30- slipped out of w/c-  Barriers to Discharge: Decreased family/caregiver support;Home enviroment access/layout;Medical stability;Weight;Weight bearing restrictions;Wound care;Other (  comments)  Barriers to Discharge Comments: family education next week- will go home with H/H; neds to go home safely- not walking at this point. Possible Resolutions to [employer]: not a lot of carry over- SB transfers mainly- legs are so weak- have to lift with arms usually; suggest off brand Steady; focus- w/c level; pain so much better- per therapy; needs to get into car- so needs stand pivot transfer to get/in/out of house- focus- on  pain control and foot drop- d/c 11/11   Continued Need for Acute Rehabilitation Level of Care: The patient requires daily medical management by a physician with specialized training in physical medicine and rehabilitation for the following reasons: Direction of a multidisciplinary physical rehabilitation program to maximize functional independence : Yes Medical management of patient stability for increased activity during participation in an intensive rehabilitation regime.: Yes Analysis of laboratory values and/or radiology reports with any subsequent need for medication adjustment and/or medical intervention. : Yes   I attest that I was present, lead the team conference, and concur with the assessment and plan of the team.   Tennis Must 04/28/2021, 6:14 PM

## 2021-04-28 NOTE — Progress Notes (Signed)
Occupational Therapy Session Note  Patient Details  Name: Marco Kennedy MRN: [id number] Date of Birth: 02/27/1951  Today's Date: 04/28/2021 OT Individual Time: 1401-1457 OT Individual Time Calculation (min): 56 min    Short Term Goals: Week 2:  OT Short Term Goal 1 (Week 2): pt will don shirt/TLSO Min A at EOB or up in w/c OT Short Term Goal 1 - Progress (Week 2): Progressing toward goal OT Short Term Goal 2 (Week 2): Pt will perform slide board transfer with Min A in prep for ADL OT Short Term Goal 2 - Progress (Week 2): Met OT Short Term Goal 3 (Week 2): Patient will don LB clothing at bed level with Mod A of 1 helper and AE PRN. OT Short Term Goal 3 - Progress (Week 2): Met OT Short Term Goal 4 (Week 2): Pt will perform sit <> stands in prep for ADL at LR[place] with  Mod A of 1 OT Short Term Goal 4 - Progress (Week 2): Progressing toward goal Week 3:  OT Short Term Goal 1 (Week 3): Pt will perform squat pivot transfers to BSC/toilet with Mod A x2 OT Short Term Goal 2 (Week 3): Pt will perform LB dress Min A with AE PRN bed level OT Short Term Goal 3 (Week 3): pt will don TLSO Min A at EOB  Skilled Therapeutic Interventions/Progress Updates:    Pt greeted at time of session sitting up in wheelchair fatigued but agreeable to OT session and did not report pain. Self propel room > day room supervision and able to line up w/c for transfer. Assist managing leg rests and total A for slide board placement, Min A for actual slide board transfer with therapist supporting at knees, extended time for pt to scoot along board. Cues throughout for body mechanics, head placement, and weight shift. Discussion throughout session regarding goals at w/c level primarily given CLOF and planned DC home next week with wife. Pt stating he is still planning on purchasing stedy like lift for home use. Pt directing his care for several trials of sit <> stands in stedy in prep for caregiver training at home. Remainder  of session on BUE there ex with 4# dumbbells for single arm chest press alternating, bicep curls, overhead press, FWD circles, and cross jabs for 1x20 each. Stedy mat > w/c and self propel back to room. Alarm on call bell in reach.   Therapy Documentation Precautions:  Precautions Precautions: Fall, Back Precaution Booklet Issued: No Precaution Comments: reviewed back precautions Required Braces or Orthoses: Spinal Brace Spinal Brace: Thoracolumbosacral orthotic, Applied in sitting position Spinal Brace Comments: TLSO when OOB only Restrictions Weight Bearing Restrictions: No     Therapy/Group: Individual Therapy  Hannah C Spach 04/28/2021, 7:24 AM 

## 2021-04-28 NOTE — Progress Notes (Signed)
Physical Therapy Session Note  Patient Details  Name: Marco Kennedy MRN: [id number] Date of Birth: 10-07-1950  Today's Date: 04/28/2021 PT Individual Time: 1600-1700 PT Individual Time Calculation (min): 60 min   Short Term Goals: Week 1:  PT Short Term Goal 1 (Week 1): Pt will tolerate sitting upright x 5 min with no more than 5/10 pain PT Short Term Goal 1 - Progress (Week 1): Met PT Short Term Goal 2 (Week 1): Pt will initiate w/c mobility PT Short Term Goal 2 - Progress (Week 1): Met PT Short Term Goal 3 (Week 1): Pt will perform bed mobility with mod A consistently adhering to back precautions PT Short Term Goal 3 - Progress (Week 1): Met Week 2:  PT Short Term Goal 1 (Week 2): Pt will perform stand pivot transfer with RW PT Short Term Goal 1 - Progress (Week 2): Progressing toward goal PT Short Term Goal 2 (Week 2): Pt will perform STS with min A PT Short Term Goal 2 - Progress (Week 2): Progressing toward goal PT Short Term Goal 3 (Week 2): Pt will initiate gait training PT Short Term Goal 3 - Progress (Week 2): Not met Week 3:  PT Short Term Goal 1 (Week 3): Pt will perform stand pivot transfer PT Short Term Goal 2 (Week 3): pt will perform car transfer with any level of assist PT Short Term Goal 3 (Week 3): Pt will perform STS with min A  Skilled Therapeutic Interventions/Progress Updates:    pt received in bed and agreeable to therapy. Pt reports 3/10 at rest and 7/10 when sitting up in bed, premedicated. Donned ted hose total assist and shorts with mod A to thread legs and pull over hips. Pt required CGA for supine>sit, supervision to don shirt and min A for brace. Pt then performed squat pivot transfer to w/c with min A x 2. Pt propelled w/c with BUE to therapy gym with supervision for endurance and functional mobility. Pt stood in // bars x 7 throughout session with mod A x 2 fading to min of 1 at times, returning to mod with fatigue. While in // bars, pt walked with max A  knee block 2 x 4 ft with w/c follow. Pt demoed heavy reliance on UE and had difficulty with shifting weight anteriorly and bringing UE forward on bars. Pt then performed pre gait activity, shifting weight forward and moving hands forward and back on bars while in split stance, x ~10 BIL. Pt then performed therEx with kinetron for improved posterior chain strength and endurance: 4x 2 min with 1 min rest at 40 cm/sec. Pt returned to room and was left with all needs in reach and alarm active.   Therapy Documentation Precautions:  Precautions Precautions: Fall, Back Precaution Booklet Issued: No Precaution Comments: reviewed back precautions Required Braces or Orthoses: Spinal Brace Spinal Brace: Thoracolumbosacral orthotic, Applied in sitting position Spinal Brace Comments: TLSO when OOB only Restrictions Weight Bearing Restrictions: No General:     Therapy/Group: Individual Therapy  Mickel Fuchs 04/28/2021, 9:56 AM

## 2021-04-28 NOTE — Progress Notes (Signed)
Physical Therapy Session Note  Patient Details  Name: Marco Kennedy MRN: [id number] Date of Birth: Sep 17, 1950  Today's Date: 04/28/2021 PT Individual Time: 1600-1700 PT Individual Time Calculation (min): 60 min   Short Term Goals: Week 3:  PT Short Term Goal 1 (Week 3): Pt will perform stand pivot transfer PT Short Term Goal 2 (Week 3): pt will perform car transfer with any level of assist PT Short Term Goal 3 (Week 3): Pt will perform STS with min A  Skilled Therapeutic Interventions/Progress Updates:    Pt received seated in w/c in room, agreeable to PT session. Pt reports pain is well-controlled this PM. Manual w/c propulsion to/from dayroom at Supervision level. Slide board transfer w/c to/from mat table with min A, increased time and # of scoots required to complete transfer. Sit to stand x 4 reps from slightly elevated mat table to RW, knees blocked anteriorly with airex pad for improved positioning. Pt is min to mod A x 1 with SBA x 1 for standing. Pt tolerates standing 2 x 60 sec, 1 x 75 sec, 1 x 120 sec with RW and min A for balance. In standing pt able to perform lateral weight shifting and mini-squats x 10 reps. Once returned to w/c pt able to perform w/c propulsion with use of BLE and intermittent assist from UE forwards/backwards 3 x 30 ft each direction for LE strengthening. Placed towel under LLE for improved ability to slide LE on the floor. Seated BUE strengthening performing 2# dowel rod volleyball, 2 x 30 reps to fatigue. Pt left seated in w/c in room with needs in reach, quick release belt in place at end of session.  Therapy Documentation Precautions:  Precautions Precautions: Fall, Back Precaution Booklet Issued: No Precaution Comments: reviewed back precautions Required Braces or Orthoses: Spinal Brace Spinal Brace: Thoracolumbosacral orthotic, Applied in sitting position Spinal Brace Comments: TLSO when OOB only Restrictions Weight Bearing Restrictions:  No     Therapy/Group: Individual Therapy   Peter Congo, PT, DPT, CSRS  04/28/2021, 6:01 PM

## 2021-04-28 NOTE — Progress Notes (Signed)
PROGRESS NOTE   Subjective/Complaints:  Scratching some, but says it's rare.  Wife has meeting with house inspector Thursday to determine what needs to be done for them to move in.   No change overall.  ROS:  Pt denies SOB, abd pain, CP, N/V/C/D, and vision changes    Objective:   No results found. Recent Labs    04/27/21 0549  WBC 4.6  HGB 10.1*  HCT 32.3*  PLT 323     Recent Labs    04/27/21 0549  NA 137  K 4.4  CL 100  CO2 28  GLUCOSE 91  BUN 19  CREATININE 0.69  CALCIUM 8.9      Intake/Output Summary (Last 24 hours) at 04/28/2021 [id number] Last data filed at 04/28/2021 0818 Gross per 24 hour  Intake 720 ml  Output 800 ml  Net -80 ml     Pressure Injury 04/10/21 Buttocks Left;Posterior;Proximal Stage 1 -  Intact skin with non-blanchable redness of a localized area usually over a bony prominence. (Active)  04/10/21   Location: Buttocks  Location Orientation: Left;Posterior;Proximal  Staging: Stage 1 -  Intact skin with non-blanchable redness of a localized area usually over a bony prominence.  Wound Description (Comments):   Present on Admission:     Physical Exam: Vital Signs Blood pressure (!) 168/87, pulse 75, temperature 97.8 F (36.6 C), temperature source Oral, resp. rate 16, height 5\' 8"  (1.727 m), weight 112.1 kg, SpO2 100 %.    General: awake, alert, appropriate, sitting up inbed; finished >75% breakfast; NAD HENT: conjugate gaze; oropharynx moist CV: regular rate; no JVD Pulmonary: CTA B/L; no W/R/R- good air movement GI: soft, NT, ND, (+)BS; protuberant Psychiatric: appropriate Neurological: Ox3  Decreased sensation from thoracic down to feet B/L- worse thighs and down, esp feet.  Skin: Warm and dry.  Sacrum not seen today Psych: Normal mood.  Normal behavior. Musc: Lower extremity edema.  No tenderness in extremities. Neuro: Alert Motor: Bilateral upper extremities: 5/5  proximal distal Bilateral lower extremities: 4/5 proximal distal,--no changes  Assessment/Plan: 1. Functional deficits which require 3+ hours per day of interdisciplinary therapy in a comprehensive inpatient rehab setting. Physiatrist is providing close team supervision and 24 hour management of active medical problems listed below. Physiatrist and rehab team continue to assess barriers to discharge/monitor patient progress toward functional and medical goals  Care Tool:  Bathing    Body parts bathed by patient: Right arm, Left arm, Chest, Abdomen, Face, Front perineal area, Right upper leg, Left upper leg   Body parts bathed by helper: Buttocks, Right lower leg, Left lower leg     Bathing assist Assist Level: Moderate Assistance - Patient 50 - 74%     Upper Body Dressing/Undressing Upper body dressing   What is the patient wearing?: Pull over shirt, Orthosis    Upper body assist Assist Level: Moderate Assistance - Patient 50 - 74%    Lower Body Dressing/Undressing Lower body dressing      What is the patient wearing?: Pants     Lower body assist Assist for lower body dressing: Moderate Assistance - Patient 50 - 74% (rolling bed level, with AE to thread)  Toileting Toileting    Toileting assist Assist for toileting: Dependent - Patient 0% (bed pan, bed level) Assistive Device Comment: able to manage urinal with set-up assist; reports need for suppository for BM   Transfers Chair/bed transfer  Transfers assist     Chair/bed transfer assist level: Contact Guard/Touching assist (SB transfer)     Locomotion Ambulation   Ambulation assist   Ambulation activity did not occur: Safety/medical concerns          Walk 10 feet activity   Assist  Walk 10 feet activity did not occur: Safety/medical concerns        Walk 50 feet activity   Assist Walk 50 feet with 2 turns activity did not occur: Safety/medical concerns         Walk 150 feet  activity   Assist Walk 150 feet activity did not occur: Safety/medical concerns         Walk 10 feet on uneven surface  activity   Assist Walk 10 feet on uneven surfaces activity did not occur: Safety/medical concerns         Wheelchair     Assist Is the patient using a wheelchair?: Yes Type of Wheelchair: Manual    Wheelchair assist level: Supervision/Verbal cueing Max wheelchair distance: 250 ft    Wheelchair 50 feet with 2 turns activity    Assist        Assist Level: Supervision/Verbal cueing   Wheelchair 150 feet activity     Assist      Assist Level: Supervision/Verbal cueing   Blood pressure (!) 168/87, pulse 75, temperature 97.8 F (36.6 C), temperature source Oral, resp. rate 16, height 5\' 8"  (1.727 m), weight 112.1 kg, SpO2 100 %.  Medical Problem List and Plan: 1.  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 05/31/2021.  TLSO back brace when out of bed. JP drain removed 10/7.2022 11/1- con't PT and OT- don't think can extend him- will d/w team today.  2.  Impaired mobility: continue lovenox.              -antiplatelet therapy: N/A 3. Postoperative spinal pain: Continue Voltaren 75 mg twice daily, Robaxin and oxycodone as needed. Added back dilaudid 1mg  q6H prn given severity of pain.   10/11-started ms contin 15 mg BID and stop Dliaudid and con't oxycodone- also add Cymbalta 30 mg QHS for nerve pain and mood.  10/20- increased Oxy to 10-15 mg q4 hours helped  Controlled with meds on 10/22 10/24- pt reports pain a little bette,r but still limiting- con't regimen for now  10/26- will change MS Contin to 6am and 6pm-  see if that helps pain anymore for Am therapy esp  10/27- will let me know in AM if helped- if not, will need to ramp up meds some.   11/1- pain is "reasonable" con't regimen 4. Mood: Provide emotional support  10/10- pt appears depressed- will d/w pt in AM about starting something that could also help pain.   10/11-added cymbalta 30 mg QHS. 10/17-increased Cymbalta to 60 mg QHS 10/26- mood brighter- con't regimen             -antipsychotic agents: N/A 5. Neuropsych: This patient is capable of making decisions on his own behalf.  6. Skin/Wound Care: Routine skin checks 7. Fluids/Electrolytes/Nutrition: Routine in and outs 8.  ID.  Transitioned from intravenous cefazolin to amoxicillin.    Check with ID when to stop Aomoxicllin- ~ 10/20- per chart-  call Dr Elba Barman or ID  10/31 wbc's 4.6 9.  Acute blood loss anemia.  Latest hemoglobin 8.4.  Follow-up CBC Monday  Hemoglobin 9.3 on 10/17  10/30- labs in AM 10.  Hyponatremia.  Sodium 136 on 10/10   10/31: 137 11.  Hypertension.   Continue Norvasc to 10 mg daily  11/1- BP up again today to [id number] systolic but usually around 130s-140s- con't regimen 12.  Irritable bowel syndrome.  Amitiza 24 mcg twice daily 13.  History of gout.  Continue colchicine 14.  OSA.  CPAP  10/26- getting CPAP nightly- con't regimen 15.  Obesity.  Dietary follow-up 16.  Hypoalbuminemia  Supplement initiated on 10/15 17.  Drug-induced constipation 10/24- no BM since 10/20- will give sorbitol and give enema if need be.   10/25- 2 Bms after intervention- con't regimen  10/27- LBM yesterday- going better- con't regimen  10/28- Bowels working better- con't regimen  11/1- LBM yesterday- con't regimen   LOS: 25 days A FACE TO FACE EVALUATION WAS PERFORMED  Marco Kennedy 04/28/2021, 8:24 AM

## 2021-04-29 NOTE — Progress Notes (Signed)
Occupational Therapy Session Note  Patient Details  Name: Marco Kennedy MRN: [id number] Date of Birth: 1950/11/10  Today's Date: 04/29/2021 OT Individual Time: 1350-1459 OT Individual Time Calculation (min): 69 min    Short Term Goals: Week 2:  OT Short Term Goal 1 (Week 2): pt will don shirt/TLSO Min A at EOB or up in w/c OT Short Term Goal 1 - Progress (Week 2): Progressing toward goal OT Short Term Goal 2 (Week 2): Pt will perform slide board transfer with Min A in prep for ADL OT Short Term Goal 2 - Progress (Week 2): Met OT Short Term Goal 3 (Week 2): Patient will don LB clothing at bed level with Mod A of 1 helper and AE PRN. OT Short Term Goal 3 - Progress (Week 2): Met OT Short Term Goal 4 (Week 2): Pt will perform sit <> stands in prep for ADL at [place] with  Mod A of 1 OT Short Term Goal 4 - Progress (Week 2): Progressing toward goal Week 3:  OT Short Term Goal 1 (Week 3): Pt will perform squat pivot transfers to BSC/toilet with Mod A x2 OT Short Term Goal 2 (Week 3): Pt will perform LB dress Min A with AE PRN bed level OT Short Term Goal 3 (Week 3): pt will don TLSO Min A at EOB  Skilled Therapeutic Interventions/Progress Updates:    Pt greeted at time of session up in wheelchair agreeable to OT session, no pain at rest but with "mild" pain later in session with core strengthening, improved with rest break. Pt self propel room <> main gym with Supervision and extended time, performed 1x20 on rebounder with 4# ball for the following: throw, throw + press, throw + overhead with feet on floor and no back support. Slide board w/c <> mat with Min A primarily to initiate over edge of board and cues for hand placement. Seated on EOM, 2 rounds of 2:00 intervals for zip ball for UB strength and endurance, first round with no weight and second round with 2# wrist weights added. Self propel back to room and reviewed where to purchase St[employer] for home use, recommended pt purchase soon 2/2  shipping time. Focused on sit > stand in [place] with Min A, simulating shower and toilet transfers for home practice. Pt up in chair call bell in reach all needs met.    Therapy Documentation Precautions:  Precautions Precautions: Fall, Back Precaution Booklet Issued: No Precaution Comments: reviewed back precautions Required Braces or Orthoses: Spinal Brace Spinal Brace: Thoracolumbosacral orthotic, Applied in sitting position Spinal Brace Comments: TLSO when OOB only Restrictions Weight Bearing Restrictions: No     Therapy/Group: Individual Therapy  Viona Gilmore 04/29/2021, 3:01 PM

## 2021-04-29 NOTE — Progress Notes (Signed)
PROGRESS NOTE   Subjective/Complaints:   Pain overall better.  Family training Tuesday.  Losing ROM of ankles.  LBM yesterday     ROS:   Pt denies SOB, abd pain, CP, N/V/C/D, and vision changes     Objective:   No results found. Recent Labs    04/27/21 0549  WBC 4.6  HGB 10.1*  HCT 32.3*  PLT 323     Recent Labs    04/27/21 0549  NA 137  K 4.4  CL 100  CO2 28  GLUCOSE 91  BUN 19  CREATININE 0.69  CALCIUM 8.9      Intake/Output Summary (Last 24 hours) at 04/29/2021 [id number] Last data filed at 04/29/2021 0036 Gross per 24 hour  Intake 840 ml  Output 900 ml  Net -60 ml     Pressure Injury 04/10/21 Buttocks Left;Posterior;Proximal Stage 1 -  Intact skin with non-blanchable redness of a localized area usually over a bony prominence. (Active)  04/10/21   Location: Buttocks  Location Orientation: Left;Posterior;Proximal  Staging: Stage 1 -  Intact skin with non-blanchable redness of a localized area usually over a bony prominence.  Wound Description (Comments):   Present on Admission:     Physical Exam: Vital Signs Blood pressure (!) 148/82, pulse 82, temperature 97.6 F (36.4 C), resp. rate 16, height 5\' 8"  (1.727 m), weight 112.1 kg, SpO2 98 %.     General: awake, alert, appropriate, sitting up in bed; NAD HENT: conjugate gaze; oropharynx moist CV: regular rate; no JVD Pulmonary: CTA B/L; no W/R/R- good air movement GI: soft, NT, ND, (+)BS Psychiatric: appropriate; still slightly flat affect, but smiling intermittently Neurological: Ox3 Extremities:- losing ROM of ankles B/L- B/L foot drop Decreased sensation from thoracic down to feet B/L- worse thighs and down, esp feet.  Skin: Warm and dry.  Sacrum not seen today Musc: Lower extremity edema.  No tenderness in extremities. Motor: Bilateral upper extremities: 5/5 proximal distal Bilateral lower extremities:B/L foot drop at rest-    Assessment/Plan: 1. Functional deficits which require 3+ hours per day of interdisciplinary therapy in a comprehensive inpatient rehab setting. Physiatrist is providing close team supervision and 24 hour management of active medical problems listed below. Physiatrist and rehab team continue to assess barriers to discharge/monitor patient progress toward functional and medical goals  Care Tool:  Bathing    Body parts bathed by patient: Right arm, Left arm, Chest, Abdomen, Face, Front perineal area, Right upper leg, Left upper leg   Body parts bathed by helper: Buttocks, Right lower leg, Left lower leg     Bathing assist Assist Level: Moderate Assistance - Patient 50 - 74%     Upper Body Dressing/Undressing Upper body dressing   What is the patient wearing?: Pull over shirt, Orthosis    Upper body assist Assist Level: Moderate Assistance - Patient 50 - 74%    Lower Body Dressing/Undressing Lower body dressing      What is the patient wearing?: Pants     Lower body assist Assist for lower body dressing: Moderate Assistance - Patient 50 - 74% (rolling bed level, with AE to thread)     Toileting Toileting  Toileting assist Assist for toileting: Dependent - Patient 0% (bed pan, bed level) Assistive Device Comment: able to manage urinal with set-up assist; reports need for suppository for BM   Transfers Chair/bed transfer  Transfers assist     Chair/bed transfer assist level: Contact Guard/Touching assist (SB transfer)     Locomotion Ambulation   Ambulation assist   Ambulation activity did not occur: Safety/medical concerns          Walk 10 feet activity   Assist  Walk 10 feet activity did not occur: Safety/medical concerns        Walk 50 feet activity   Assist Walk 50 feet with 2 turns activity did not occur: Safety/medical concerns         Walk 150 feet activity   Assist Walk 150 feet activity did not occur: Safety/medical concerns          Walk 10 feet on uneven surface  activity   Assist Walk 10 feet on uneven surfaces activity did not occur: Safety/medical concerns         Wheelchair     Assist Is the patient using a wheelchair?: Yes Type of Wheelchair: Manual    Wheelchair assist level: Supervision/Verbal cueing Max wheelchair distance: 250 ft    Wheelchair 50 feet with 2 turns activity    Assist        Assist Level: Supervision/Verbal cueing   Wheelchair 150 feet activity     Assist      Assist Level: Supervision/Verbal cueing   Blood pressure (!) 148/82, pulse 82, temperature 97.6 F (36.4 C), resp. rate 16, height 5\' 8"  (1.727 m), weight 112.1 kg, SpO2 98 %.  Medical Problem List and Plan: 1.  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 05/31/2021.  TLSO back brace when out of bed. JP drain removed 10/7.2022 11/1- con't PT and OT- don't think can extend him- will d/w team today.   11/2- will order PRAFOs at night to improve ankle ROM_ con't PT and OT- discussed will go home at w/c level- pt agreed with order Steady at home.  2.  Impaired mobility: continue lovenox.              -antiplatelet therapy: N/A 3. Postoperative spinal pain: Continue Voltaren 75 mg twice daily, Robaxin and oxycodone as needed. Added back dilaudid 1mg  q6H prn given severity of pain.   10/11-started ms contin 15 mg BID and stop Dliaudid and con't oxycodone- also add Cymbalta 30 mg QHS for nerve pain and mood.  10/20- increased Oxy to 10-15 mg q4 hours helped  Controlled with meds on 10/22 10/24- pt reports pain a little bette,r but still limiting- con't regimen for now  10/26-  will change MS Contin to 6am and 6pm- see if that helps pain anymore for Am therapy esp  10/27- will let me know in AM if helped- if not, will need to ramp up meds some.   11/2- pain overall controlled- con't regimen 4. Mood: Provide emotional support  10/10- pt appears depressed- will d/w pt in AM about starting something that could also help pain.   10/11-added cymbalta 30 mg QHS. 10/17-increased Cymbalta to 60 mg QHS 10/26- mood brighter- con't regimen             -  antipsychotic agents: N/A 5. Neuropsych: This patient is capable of making decisions on his own behalf. 6. Skin/Wound Care: Routine skin checks 7. Fluids/Electrolytes/Nutrition: Routine in and outs 8.  ID.  Transitioned from intravenous cefazolin to amoxicillin.    Check with ID when to stop Aomoxicllin- ~ 10/20- per chart-  call Dr Elba Barman or ID  10/31 wbc's 4.6 9.  Acute blood loss anemia.  Latest hemoglobin 8.4.  Follow-up CBC Monday  Hemoglobin 9.3 on 10/17  10/30- labs in AM 10.  Hyponatremia.  Sodium 136 on 10/10   10/31: 137 11.  Hypertension.   Continue Norvasc to 10 mg daily  11/2- BP better- con't regimen 12.  Irritable bowel syndrome.  Amitiza 24 mcg twice daily 13.  History of gout.  Continue colchicine 14.  OSA.  CPAP  10/26- getting CPAP nightly- con't regimen 15.  Obesity.  Dietary follow-up 16.  Hypoalbuminemia  Supplement initiated on 10/15 17.  Drug-induced constipation 11/2- LBM yesterday- con't regimen  I spent a total of 36 minutes on total care- >50% coordination of care- discussing going home at w/c level and d/w nurse about PRAFOs  LOS: 26 days A FACE TO FACE EVALUATION WAS PERFORMED  Ivannah Zody 04/29/2021, 8:28 AM

## 2021-04-29 NOTE — Progress Notes (Signed)
Orthopedic Tech Progress Note Patient Details:  Marco Kennedy December 06, 1950 [id number]  Called in order to [facility] for STAT PRAFO BOOTS  [name], male   DOB: [birth year], 70 y.o.   MRN: [id number]  [name] 04/29/2021, 8:34 AM

## 2021-04-29 NOTE — Progress Notes (Signed)
Physical Therapy Session Note  Patient Details  Name: Marco Kennedy MRN: [id number] Date of Birth: 11-Sep-1950  Today's Date: 04/29/2021 PT Individual Time: 0900-1010; 6073-7106 PT Individual Time Calculation (min): 70 min and 45 min  Short Term Goals: Week 3:  PT Short Term Goal 1 (Week 3): Pt will perform stand pivot transfer PT Short Term Goal 2 (Week 3): pt will perform car transfer with any level of assist PT Short Term Goal 3 (Week 3): Pt will perform STS with min A  Skilled Therapeutic Interventions/Progress Updates:    Session 1: Pt received seated in bed, agreeable to PT session. Pt reports pain is controlled this AM, premedicated prior to start of therapy session. Assisted pt with donning knee-high TEDs and pants at bed level. Supine to sit with mod A for some LE management and trunk elevation, use of bedrail and HOB elevated. Pt is setup A to change shirt while seated EOB. Assisted pt with donning shoes and TLSO while seated EOB. Slide board transfer to w/c with min A. Pt is setup A for oral hygiene while seated in w/c at sink. Manual w/c propulsion 2 x 200 ft with use of BUE at Supervision level. Sit to stand in // bars with mod A increasing to max A with onset of fatigue, use of airex to assist with blocking knees. Standing alt L/R marches 2 x 10 reps, mini-squats 2 x 10 reps in standing with min A for balance, knees blocked. Seated UE strengthening with 3# dowel rod progressing to 4# dowel rod performing volleyball, 3 x 35 reps. Pt left seated in w/c at sink setup for shaving his face, quick release belt and chair alarm in place.  Session 2: Pt received seated in w/c in room, agreeable to PT session. No complaints of pain. Manual w/c propulsion 2 x 100 ft with use of BUE at Supervision level. Slide board transfer w/c to/from mat table with min A. Sit to stand x 4 reps to RW from elevated mat table with min A, knees blocked with use of airex. Pt tolerates standing up to 4 min this  session! Pt does continue to exhibit heavy UE reliance on RW in standing as well as intermittent knee buckling with onset of fatigue. Standing lateral weight shifts and mini-squats in standing for LE strengthening and pre-gait training. Pt encouraged to continue to work on standing and working towards stand pivot transfers in order to transfer safely into his vehicle for a safe d/c home. Pt left seated in w/c in room with needs in reach, quick release belt in place at end of session.  Therapy Documentation Precautions:  Precautions Precautions: Fall, Back Precaution Booklet Issued: No Precaution Comments: reviewed back precautions Required Braces or Orthoses: Spinal Brace Spinal Brace: Thoracolumbosacral orthotic, Applied in sitting position Spinal Brace Comments: TLSO when OOB only Restrictions Weight Bearing Restrictions: No     Therapy/Group: Individual Therapy   Peter Congo, PT, DPT, CSRS  04/29/2021, 10:58 AM

## 2021-04-29 NOTE — Progress Notes (Addendum)
Pharmacy - Amoxicillin  Planning to continue amoxicillin until follow up appointment with ID. Thank you. Okey Regal, PharmD

## 2021-04-30 MED ORDER — CARVEDILOL 3.125 MG PO TABS
3.1250 mg | ORAL_TABLET | Freq: Two times a day (BID) | ORAL | Status: DC
  Administered 2021-04-30 – 2021-05-08 (×16): 3.125 mg via ORAL
  Filled 2021-04-30 (×16): qty 1

## 2021-04-30 NOTE — Progress Notes (Signed)
Occupational Therapy Weekly Progress Note  Patient Details  Name: Marco Kennedy MRN: [id number] Date of Birth: 05-11-1951  Beginning of progress report period: April 22, 2021 End of progress report period: April 30, 2021  Today's Date: 04/30/2021 OT Individual Time: 1100-1155 OT Individual Time Calculation (min): 55 min    Patient has met 1 of 3 short term goals.  Pt is slowly progressing toward OT goals as pain is much better controlled and pt is able to participate more. Pt is performing slide board transfers to even surfaces with CGA/Min and needing more assist for uneven surfaces. Pt is Mod/Max for LB dressing, Mod for LB bathing bed level, and needs assist for bed mobility to come up to sitting. Pt is self propelling throughout unit with Supervision. Pt needs 2 assist for squat pivot transfers and sit <> stands at [place]. Plan to focus on DC planning and family education in upcoming week.   Patient continues to demonstrate the following deficits: muscle weakness, decreased cardiorespiratoy endurance, impaired timing and sequencing, unbalanced muscle activation, decreased coordination, and decreased motor planning, decreased motor planning, and decreased sitting balance, decreased standing balance, decreased postural control, and decreased balance strategies and therefore will continue to benefit from skilled OT intervention to enhance overall performance with BADL, iADL, and Reduce care partner burden.  Patient not progressing toward long term goals.  See goal revision..  Plan of care revisions: include downgrading toileting, LB dressing, and transfer goals as appropriate. Note DC standing goals as pt will be using Stedy like assist at home and is not using RW at functional level.  OT Short Term Goals Week 3:  OT Short Term Goal 1 (Week 3): Pt will perform squat pivot transfers to BSC/toilet with Mod A x2 OT Short Term Goal 1 - Progress (Week 3): Not met OT Short Term Goal 2 (Week 3): Pt  will perform LB dress Min A with AE PRN bed level OT Short Term Goal 2 - Progress (Week 3): Progressing toward goal OT Short Term Goal 3 (Week 3): pt will don TLSO Min A at EOB OT Short Term Goal 3 - Progress (Week 3): Met Week 4:  OT Short Term Goal 1 (Week 4): STGs = LTGs 2/2 LOS  Skilled Therapeutic Interventions/Progress Updates:    Pt greeted at time of session up in w/c agreeable to OT session, no pain at rest and only mild L lower back pain with there ex later in session that resolved with rest breaks. Discussion at beginning of session regarding taking a shower soon before going home, pt wanting to practice shower transfers today prior to taking real shower. Stedy transfer w/c <> shower bench with Min/CGA to stand in stedy from both surfaces, discussing sequencing of clothing management and how would perform at home. Self propel room <> main gym with supervision and slide board w/c <> mat to level surface CGA! Therapist assist only to block knees/feet from moving and sliding out. Pt able to place board today with Min A as well. On EOM, focused on UB and core strengthening sitting unsupported with 6# dowel for the following 2x20: bicep curl, chest press, FWD and backward circles as well as 2x10 and 1x5 pushup blocks for global shoulder and trciep strengthening. Back in room set up call bell in reach all needs met.    Therapy Documentation Precautions:  Precautions Precautions: Fall, Back Precaution Booklet Issued: No Precaution Comments: reviewed back precautions Required Braces or Orthoses: Spinal Brace Spinal Brace: Thoracolumbosacral orthotic, Applied  in sitting position Spinal Brace Comments: TLSO when OOB only Restrictions Weight Bearing Restrictions: No     Therapy/Group: Individual Therapy  Viona Gilmore 04/30/2021, 7:22 AM

## 2021-04-30 NOTE — Progress Notes (Signed)
Physical Therapy Session Note  Patient Details  Name: Marco Kennedy MRN: [id number] Date of Birth: 1950-08-28  Today's Date: 04/30/2021 PT Individual Time: 1300-1330 PT Individual Time Calculation (min): 30 min   Short Term Goals: Week 1:  PT Short Term Goal 1 (Week 1): Pt will tolerate sitting upright x 5 min with no more than 5/10 pain PT Short Term Goal 1 - Progress (Week 1): Met PT Short Term Goal 2 (Week 1): Pt will initiate w/c mobility PT Short Term Goal 2 - Progress (Week 1): Met PT Short Term Goal 3 (Week 1): Pt will perform bed mobility with mod A consistently adhering to back precautions PT Short Term Goal 3 - Progress (Week 1): Met Week 2:  PT Short Term Goal 1 (Week 2): Pt will perform stand pivot transfer with RW PT Short Term Goal 1 - Progress (Week 2): Progressing toward goal PT Short Term Goal 2 (Week 2): Pt will perform STS with min A PT Short Term Goal 2 - Progress (Week 2): Progressing toward goal PT Short Term Goal 3 (Week 2): Pt will initiate gait training PT Short Term Goal 3 - Progress (Week 2): Not met Week 3:  PT Short Term Goal 1 (Week 3): Pt will perform stand pivot transfer PT Short Term Goal 2 (Week 3): pt will perform car transfer with any level of assist PT Short Term Goal 3 (Week 3): Pt will perform STS with min A  Skilled Therapeutic Interventions/Progress Updates:    Pain:  no complaints during session.  Treatment to tolerance.  Pt initially ob in wc and agreeable to treatment session w/focus on transfer training.   Once in wc, pt transported to gym for session.  Discussed/demonstrated stand pivot transfer using RW wc to/from mat. Pt performed Sit to stand from wc w/mod assist, therapist stabilizing knees manually, second person guarding from back for safety/providing power up assist. stand pivot transfer performed w/max cues, for foot placmeent/advancement, heavy mod assist of 1, from front to ensure knee stability, upright posture. Knee  buckling occurred x 1 w/mod tactile cues/assist to regain stability, due to poor foot placement pt proceeded to squat pivot to mat table.  Pt completed mat to wc and then repeated wc to/from mat via spt as above but no buckling, difficulty advancing LLE at times and unaware of this creating narrow base then much difficulty continueing w/wt shifting/advancing LLE but completes w/overall mod assist.  Pt also w/varying trunk lean further contributing to instability w/transfer.  Worked on alternating sidestepping w/RW w/emphasis on awareness via visual check of foot plaement w/each step. Guarding as above for safety.  Performs w/improved stability when broken down.    Will need continued strengtheing and training to improve safety/utilize stand pivot transfer in viable fashion. Pt left oob in wc w/alarm belt set and needs in reach    Therapy Documentation Precautions:  Precautions Precautions: Fall, Back Precaution Booklet Issued: No Precaution Comments: reviewed back precautions Required Braces or Orthoses: Spinal Brace Spinal Brace: Thoracolumbosacral orthotic, Applied in sitting position Spinal Brace Comments: TLSO when OOB only Restrictions Weight Bearing Restrictions: No     Therapy/Group: Individual Therapy Marco Kennedy, Marco Kennedy 04/30/2021, 4:03 PM

## 2021-04-30 NOTE — Progress Notes (Signed)
Physical Therapy Session Note  Patient Details  Name: Marco Kennedy MRN: [id number] Date of Birth: Oct 03, 1950  Today's Date: 04/30/2021 PT Individual Time: 1537-9432, 7614-7092 PT Individual Time Calculation (min): 57 min, 38 min  Short Term Goals: Week 1:  PT Short Term Goal 1 (Week 1): Pt will tolerate sitting upright x 5 min with no more than 5/10 pain PT Short Term Goal 1 - Progress (Week 1): Met PT Short Term Goal 2 (Week 1): Pt will initiate w/c mobility PT Short Term Goal 2 - Progress (Week 1): Met PT Short Term Goal 3 (Week 1): Pt will perform bed mobility with mod A consistently adhering to back precautions PT Short Term Goal 3 - Progress (Week 1): Met Week 2:  PT Short Term Goal 1 (Week 2): Pt will perform stand pivot transfer with RW PT Short Term Goal 1 - Progress (Week 2): Progressing toward goal PT Short Term Goal 2 (Week 2): Pt will perform STS with min A PT Short Term Goal 2 - Progress (Week 2): Progressing toward goal PT Short Term Goal 3 (Week 2): Pt will initiate gait training PT Short Term Goal 3 - Progress (Week 2): Not met Week 3:  PT Short Term Goal 1 (Week 3): Pt will perform stand pivot transfer PT Short Term Goal 2 (Week 3): pt will perform car transfer with any level of assist PT Short Term Goal 3 (Week 3): Pt will perform STS with min A  Skilled Therapeutic Interventions/Progress Updates:    Session 1: pt received in bed and agreeable to therapy. Pt reports no incr in baseline pain with activity, premedicated. Donned ted hose with total A, shorts with mod A for time. Rolling and supine>sit with CGA. Pt doffed/donned shirt and brace with set up. Squat pivot to w/c with max A to clear buttocks. Pt propelled w/c with BUE to therapy gym for endurance and functional mobility. Pt performed the following activities in //bars for increased support. Pt performed split stance weight shifting for pre gait muscle activation. Pt then walked in bars x 3 ft, x6 ft, x 3 ft  with knee block and max VC for anterior weight shift and moving hands forward on bar. Pt continues to have difficulty with proprioception and demoes heavy UE reliance in standing. Transitioned to mat table, and performed mod-max Squat pivot transfer x 2. Pt requires assist to clear buttocks, especially with fatigue. Pt then performed slideboard transfer with supervision, only occ CGA. Pt returned to room and was left with all needs in reach and alarm active.   Session 2: Pt seated in w/c on arrival and agreeable to therapy. Premedicated, so no acute changes in pain. Pt propelled w/c with BUE to therapy gym for endurance and functional mobility. Pt performed squat pivot transfer <> mat table with mod A x 2 to lift and clear buttocks. Pt then performed Sit to stand x 1 with mod A x 2, with assist to stand from behind and knee block. Pt directed in shifting feet with visual feedback with emphasis on incr proprioception. Pt then directed in side stepping at mat table with same assist and visual feed back, x 2 bouts, first ~3 steps, then ~6 steps. Extended rest breaks between bouts, discussing d/c planning. Therapist requested measurements of both pt's cars to practice with appropriate height. Pt agreeable to borrow a car if neither one is a good height to transfer. Pt then propelled w/c with BLE, first pulling then pushing x 50 ft each  for improved LE strength and coordination. Pt remained in w/c at end of session and was left with all needs in reach and alarm active.   Therapy Documentation Precautions:  Precautions Precautions: Fall, Back Precaution Booklet Issued: No Precaution Comments: reviewed back precautions Required Braces or Orthoses: Spinal Brace Spinal Brace: Thoracolumbosacral orthotic, Applied in sitting position Spinal Brace Comments: TLSO when OOB only Restrictions Weight Bearing Restrictions: No General:     Therapy/Group: Individual Therapy  Mickel Fuchs 04/30/2021, 10:16 AM

## 2021-04-30 NOTE — Progress Notes (Signed)
PROGRESS NOTE   Subjective/Complaints:  Pt reports slept really well- had PRAFOs and wore all night.  Health visitor with wife today- if OK, will order steady for new home.  Knees which were really a problem last night, aren't this time- likely due to pain meds.  Stood 4 minutes on RW yesterday!   ROS:   Pt denies SOB, abd pain, CP, N/V/C/D, and vision changes     Objective:   No results found. No results for input(s): WBC, HGB, HCT, PLT in the last 72 hours.    No results for input(s): NA, K, CL, CO2, GLUCOSE, BUN, CREATININE, CALCIUM in the last 72 hours.     Intake/Output Summary (Last 24 hours) at 04/30/2021 0845 Last data filed at 04/30/2021 0700 Gross per 24 hour  Intake 802 ml  Output 825 ml  Net -23 ml         Physical Exam: Vital Signs Blood pressure (!) 151/82, pulse 75, temperature 97.8 F (36.6 C), temperature source Oral, resp. rate 18, height 5\' 8"  (1.727 m), weight 112.1 kg, SpO2 100 %.      General: awake, alert, appropriate, sitting u in bed; NAD HENT: conjugate gaze; oropharynx moist CV: regular rate; no JVD Pulmonary: CTA B/L; no W/R/R- good air movement GI: soft, NT, ND, (+)BS; protuberant Psychiatric: appropriate; flattish, but better overall Neurological: Ox3 B/L foot drop- Extremities:-  B/L foot drop- improved ROM of ankles today- after wearing PRAFOs all night.  Decreased sensation from thoracic down to feet B/L- worse thighs and down, esp feet.  Skin: Warm and dry.  Sacrum not seen today Musc: Lower extremity edema.  No tenderness in extremities. Motor: Bilateral upper extremities: 5/5 proximal distal Bilateral lower extremities:B/L foot drop at rest-   Assessment/Plan: 1. Functional deficits which require 3+ hours per day of interdisciplinary therapy in a comprehensive inpatient rehab setting. Physiatrist is providing close team supervision and 24 hour  management of active medical problems listed below. Physiatrist and rehab team continue to assess barriers to discharge/monitor patient progress toward functional and medical goals  Care Tool:  Bathing    Body parts bathed by patient: Right arm, Left arm, Chest, Abdomen, Face, Front perineal area, Right upper leg, Left upper leg   Body parts bathed by helper: Buttocks, Right lower leg, Left lower leg     Bathing assist Assist Level: Moderate Assistance - Patient 50 - 74%     Upper Body Dressing/Undressing Upper body dressing   What is the patient wearing?: Pull over shirt, Orthosis    Upper body assist Assist Level: Moderate Assistance - Patient 50 - 74%    Lower Body Dressing/Undressing Lower body dressing      What is the patient wearing?: Pants     Lower body assist Assist for lower body dressing: Moderate Assistance - Patient 50 - 74% (rolling bed level, with AE to thread)     Toileting Toileting    Toileting assist Assist for toileting: Dependent - Patient 0% (bed pan, bed level) Assistive Device Comment: able to manage urinal with set-up assist; reports need for suppository for BM   Transfers Chair/bed transfer  Transfers assist  Chair/bed transfer assist level: Minimal Assistance - Patient > 75%     Locomotion Ambulation   Ambulation assist   Ambulation activity did not occur: Safety/medical concerns          Walk 10 feet activity   Assist  Walk 10 feet activity did not occur: Safety/medical concerns        Walk 50 feet activity   Assist Walk 50 feet with 2 turns activity did not occur: Safety/medical concerns         Walk 150 feet activity   Assist Walk 150 feet activity did not occur: Safety/medical concerns         Walk 10 feet on uneven surface  activity   Assist Walk 10 feet on uneven surfaces activity did not occur: Safety/medical concerns         Wheelchair     Assist Is the patient using a  wheelchair?: Yes Type of Wheelchair: Manual    Wheelchair assist level: Supervision/Verbal cueing Max wheelchair distance: 200'    Wheelchair 50 feet with 2 turns activity    Assist        Assist Level: Supervision/Verbal cueing   Wheelchair 150 feet activity     Assist      Assist Level: Supervision/Verbal cueing   Blood pressure (!) 151/82, pulse 75, temperature 97.8 F (36.6 C), temperature source Oral, resp. rate 18, height 5\' 8"  (1.727 m), weight 112.1 kg, SpO2 100 %.  Medical Problem List and Plan: 1.  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 05/31/2021.  TLSO back brace when out of bed. JP drain removed 10/7.2022 11/1- con't PT and OT- don't think can extend him- will d/w team today.   11/2- will order PRAFOs at night to improve ankle ROM_ con't PT and OT- discussed will go home at w/c level- pt agreed with order Steady at home.   11/3- 13/3 d/w wife today- con't PT/OT- stood 4 minutes-  2.  Impaired mobility: continue lovenox.              -antiplatelet therapy: N/A 3. Postoperative spinal pain: Continue Voltaren 75 mg twice daily, Robaxin and oxycodone as needed. Added back dilaudid 1mg  q6H prn given severity of pain.   10/11-started ms contin 15 mg BID and stop Dliaudid and con't oxycodone- also add Cymbalta 30 mg QHS for nerve pain and mood.  10/20- increased Oxy to 10-15 mg q4 hours helped  Controlled with meds on 10/22 10/24- pt reports pain a little bette,r but still limiting- con't regimen for now  10/26- will change MS Contin to 6am and 6pm- see if that helps pain anymore for Am therapy esp  11/3- Pain much better  controlled since admission- con't regimen 4. Mood: Provide emotional support  10/10- pt appears depressed- will d/w pt in AM about starting something that could also help pain.   10/11-added cymbalta 30 mg QHS. 10/17-increased Cymbalta to 60 mg QHS 10/26- mood brighter- con't regimen             -antipsychotic agents: N/A 5. Neuropsych: This patient is capable of making decisions on his own behalf. 6. Skin/Wound Care: Routine skin checks 7. Fluids/Electrolytes/Nutrition: Routine in and outs 8.  ID.  Transitioned from intravenous cefazolin  to amoxicillin.    Check with ID when to stop Aomoxicllin- ~ 10/20- per chart-  call Dr Dorna Bloom or ID  10/31 wbc's 4.6 9.  Acute blood loss anemia.  Latest hemoglobin 8.4.  Follow-up CBC Monday  Hemoglobin 9.3 on 10/17  11/3- Hb 10.1- con't to monitor 10.  Hyponatremia.  Sodium 136 on 10/10   10/31: 137 11.  Hypertension.   Continue Norvasc to 10 mg daily  11/3- will add Coreg- pulse can handle it- 3.125 mg BID for borderline high BP.  12.  Irritable bowel syndrome.  Amitiza 24 mcg twice daily  11/3- going regularly with meds- con't regimen  13.  History of gout.  Continue colchicine 14.  OSA.  CPAP  10/26- getting CPAP nightly- con't regimen 15.  Obesity.  Dietary follow-up 16.  Hypoalbuminemia  Supplement initiated on 10/15 17.  Drug-induced constipation 11/2- LBM yesterday- con't regimen    LOS: 27 days A FACE TO FACE EVALUATION WAS PERFORMED  Charne Mcbrien 04/30/2021, 8:45 AM

## 2021-04-30 NOTE — Progress Notes (Signed)
Pt slept well and wore his PRAFO boots all night.

## 2021-05-01 NOTE — Progress Notes (Signed)
Occupational Therapy Session Note  Patient Details  Name: Marco Kennedy MRN: [id number] Date of Birth: 1950-07-03  Today's Date: 05/01/2021 OT Individual Time: 1025-1100 OT Individual Time Calculation (min): 35 min    Short Term Goals: Week 4:  OT Short Term Goal 1 (Week 4): STGs = LTGs 2/2 LOS   Skilled Therapeutic Interventions/Progress Updates:    Pt sitting up in the w/c with 3/10 pain in his back, reporting he is premedicated and agreeable to OT. He required mod A to remove elevating leg rests from w/c and was then able to use his BLE and BUE to propel w/c to the sink for oral care. UB bathing and dressing with set up assist. OT assisted pt with hair care via shower cap. Pt was able to re-don TLSO without assist. Discussed home set up and accessibility, including recommendations to make sinks more accessible. Teds donned and shoes with max A. Pt was left sitting up in the w/c, with PT entering room for next session.   Therapy Documentation Precautions:  Precautions Precautions: Fall, Back Precaution Booklet Issued: No Precaution Comments: reviewed back precautions Required Braces or Orthoses: Spinal Brace Spinal Brace: Thoracolumbosacral orthotic, Applied in sitting position Spinal Brace Comments: TLSO when OOB only Restrictions Weight Bearing Restrictions: No General:   Vital Signs:  Pain: Pain Assessment Pain Scale: 0-10 Pain Score: 7  Pain Type: Acute pain Pain Location: Back Pain Orientation: Mid Pain Frequency: Intermittent Pain Onset: With Activity Pain Intervention(s): Medication (See eMAR) ADL: ADL Eating: Set up Grooming: Setup Where Assessed-Grooming: Edge of bed (sitting EOB with Min to Mod A to maintain static sitting balance) Upper Body Bathing: Moderate assistance Where Assessed-Upper Body Bathing: Bed level Lower Body Bathing: Dependent Where Assessed-Lower Body Bathing: Bed level Upper Body Dressing: Maximal assistance Where Assessed-Upper  Body Dressing: Edge of bed Lower Body Dressing: Dependent Where Assessed-Lower Body Dressing: Bed level Toileting: Setup Where Assessed-Toileting: Other (Comment) (Urinal) Toilet Transfer Method: Unable to assess Tub/Shower Transfer: Unable to assess Vision   Perception    Praxis   Exercises:   Other Treatments:     Therapy/Group: Individual Therapy  Crissie Reese 05/01/2021, 10:32 AM

## 2021-05-01 NOTE — Progress Notes (Signed)
Physical Therapy Weekly Progress Note  Patient Details  Name: Marco Kennedy MRN: [id number] Date of Birth: 1950-09-27  Beginning of progress report period: April 20, 2021 End of progress report period: May 01, 2021  Today's Date: 05/01/2021 PT Individual Time: 1100-1158 PT Individual Time Calculation (min): 58 min   Patient has met 2 of 3 short term goals.  Pt has not yet performed car transfer at this time, but is progressing toward goal.  Patient continues to demonstrate the following deficits muscle weakness, decreased cardiorespiratoy endurance, poor proprioception, and decreased standing balance, decreased balance strategies, and difficulty maintaining precautions and therefore will continue to benefit from skilled PT intervention to increase functional independence with mobility.  Patient progressing toward long term goals..  Continue plan of care.  PT Short Term Goals Week 3:  PT Short Term Goal 1 (Week 3): Pt will perform stand pivot transfer PT Short Term Goal 1 - Progress (Week 3): Met PT Short Term Goal 2 (Week 3): pt will perform car transfer with any level of assist PT Short Term Goal 2 - Progress (Week 3): Progressing toward goal PT Short Term Goal 3 (Week 3): Pt will perform STS with min A PT Short Term Goal 3 - Progress (Week 3): Met Week 4:  PT Short Term Goal 1 (Week 4): =LTGs d/t ELOS  Skilled Therapeutic Interventions/Progress Updates:    Session 1:  Pt seated in w/c on arrival with OT exiting room, agreeable to therapy. Pt reports no incr in baseline levels of pain with activity, premedicated. Pt propelled w/c with BUE to day room, managing leg rests with supervision. Pt directed in squat pivot transfer w/c<>mat table x 4 with mod A x 2 fading to min A x 1 with +2 for safety. Pt able to set up for transfer with only occ cueing for foot placement. Extensive discussion of mechanics throughout to promote independence, including hand placement, anterior weight  shift, head hips relationship, and foot placement. Pt then stood with RW with mod-min A x 2 practiced moving 1 foot to the side, for improved single leg stance stability and improved proprioception/control of BLE. Finished session by standing from elevated mat table x 1 min and x 2 min with knee block for improved standing tolerance and muscle activation in BLE. Pt propelled w/c back to room and was left with all needs in reach and alarm active.   Session 2: Pt seated in w/c on arrival and agreeable to therapy. Pt reports no incr in baseline pain with acitivity, nsg administered scheduled medication during session. Pt propelled w/c with BUE to dayroom for functional mobility and endurance. Session focused on gait training and improving weight bearing tolerance and LE strength using lite gait. Pt set up with harness system and properly secured before beginning intervention. Pt stood with assist from lite gait throughout d/t poor alignment to power up in harness system. Pt ambulated x 30 ft during first bout. Pt had difficulty with shifting weight forward onto feet d/t sitting in sling, resulting in feet anterior of body while stepping. Pt had better success with weight acceptance with max VC and facilitation for weight shift. 2nd bout = 60 ft with standing rest breaks. With fatigue, pt demoed incr inversion L>R ankles and difficulty advancing BLE. Pt then ambulated x~40 ft with lite gait and RW. Pt had incr difficulty advancing BLE and coordinating weight shift onto BLE, requiring max facilitation and VC throughout. Pt then returned to w/c and propelled back to room, was  left with all needs in reach and alarm active.     Therapy Documentation Precautions:  Precautions Precautions: Fall, Back Precaution Booklet Issued: No Precaution Comments: reviewed back precautions Required Braces or Orthoses: Spinal Brace Spinal Brace: Thoracolumbosacral orthotic, Applied in sitting position Spinal Brace Comments:  TLSO when OOB only Restrictions Weight Bearing Restrictions: No General:      Therapy/Group: Individual Therapy  Mickel Fuchs 05/01/2021, 7:53 AM

## 2021-05-01 NOTE — Progress Notes (Signed)
Occupational Therapy Session Note  Patient Details  Name: Marco Kennedy MRN: [id number] Date of Birth: 1950/11/25  Today's Date: 05/01/2021 OT Individual Time: 3735-7897 OT Individual Time Calculation (min): 27 min    Short Term Goals: Week 4:  OT Short Term Goal 1 (Week 4): STGs = LTGs 2/2 LOS  Skilled Therapeutic Interventions/Progress Updates:    At start of session, pt getting to toilet with A of +2 with use of the stedy.  Pt voided bowel and bladder on toilet.  Prior to standing, pt worked with donning underwear and pants with reacher with mod A to get over feet. His legs were dangling from height of toilet so it was more difficult for him to manage the reacher.   Pt was able to rise to stand in stedy with CGA of 1 only.  Total A with cleansing bottom. Pt sat back down to cleanse his front private area himself. Stood 2nd time with CGA in stedy.  Total A to manage clothing over hips.  Pt was then transferred back to wc.  Elevating leg rests donned on wc but too long for his legs so placed pillow under his feet.   Pt resting in wc with all needs met and belt alarm on. Call light in reach.  Therapy Documentation Precautions:  Precautions Precautions: Fall, Back Precaution Booklet Issued: No Precaution Comments: reviewed back precautions Required Braces or Orthoses: Spinal Brace Spinal Brace: Thoracolumbosacral orthotic, Applied in sitting position Spinal Brace Comments: TLSO when OOB only Restrictions Weight Bearing Restrictions: No  Vital Signs: Therapy Vitals Temp: 98.7 F (37.1 C) Pulse Rate: 67 Resp: 18 BP: (!) 148/78 Patient Position (if appropriate): Lying Oxygen Therapy SpO2: 100 % O2 Device: CPAP Pain: Pain Assessment Pain Scale: 0-10 Pain Score: 7  Pain Type: Acute pain Pain Location: Back Pain Orientation: Mid Pain Frequency: Intermittent Pain Onset: With Activity Pain Intervention(s): Medication (See eMAR)    Therapy/Group: Individual  Therapy  Jadamarie Butson 05/01/2021, 8:41 AM

## 2021-05-01 NOTE — Progress Notes (Signed)
PROGRESS NOTE   Subjective/Complaints:  Pt wearing PRAFOs' nightly- wife met with Associate Professor- thinks things going well- has Steady on order- to get there by 11/11.   Yesterday, therapy went well- slept well.  No muscle spasms at all.   Goal to have BM today- LBM 2 daysa go.    ROS:   Pt denies SOB, abd pain, CP, N/V/C/D, and vision changes    Objective:   No results found. No results for input(s): WBC, HGB, HCT, PLT in the last 72 hours.    No results for input(s): NA, K, CL, CO2, GLUCOSE, BUN, CREATININE, CALCIUM in the last 72 hours.     Intake/Output Summary (Last 24 hours) at 05/01/2021 0833 Last data filed at 05/01/2021 0700 Gross per 24 hour  Intake 920 ml  Output 500 ml  Net 420 ml         Physical Exam: Vital Signs Blood pressure (!) 148/78, pulse 67, temperature 98.7 F (37.1 C), resp. rate 18, height _0  (1.727 m), weight 112.1 kg, SpO2 100 %.       General: awake, alert, appropriate, sitting up in bed; ate 90% of breakfast; NAD HENT: conjugate gaze; oropharynx moist CV: regular rate; no JVD Pulmonary: CTA B/L; no W/R/R- good air movement GI: soft, NT, ND, (+)BS- hypoactive; protuberant Psychiatric: appropriate; still slightly flat, but appears it's his normal affect Neurological: Ox3 B/L foot drop-no change- wearing PRAFO's Extremities:-  B/L foot drop- improved ROM of ankles today- after wearing PRAFOs all night.  Decreased sensation from thoracic down to feet B/L- worse thighs and down, esp feet.  Skin: Warm and dry.  Sacrum not seen today Musc: Lower extremity edema.  No tenderness in extremities. Motor: Bilateral upper extremities: 5/5 proximal distal Bilateral lower extremities:B/L foot drop at rest-   Assessment/Plan: 1. Functional deficits which require 3+ hours per day of interdisciplinary therapy in a comprehensive inpatient rehab setting. Physiatrist is providing  close team supervision and 24 hour management of active medical problems listed below. Physiatrist and rehab team continue to assess barriers to discharge/monitor patient progress toward functional and medical goals  Care Tool:  Bathing    Body parts bathed by patient: Right arm, Left arm, Chest, Abdomen, Face, Front perineal area, Right upper leg, Left upper leg   Body parts bathed by helper: Buttocks, Right lower leg, Left lower leg     Bathing assist Assist Level: Moderate Assistance - Patient 50 - 74%     Upper Body Dressing/Undressing Upper body dressing   What is the patient wearing?: Pull over shirt, Orthosis    Upper body assist Assist Level: Moderate Assistance - Patient 50 - 74%    Lower Body Dressing/Undressing Lower body dressing      What is the patient wearing?: Pants     Lower body assist Assist for lower body dressing: Moderate Assistance - Patient 50 - 74% (rolling bed level, with AE to thread)     Toileting Toileting    Toileting assist Assist for toileting: Dependent - Patient 0% (bed pan, bed level) Assistive Device Comment: able to manage urinal with set-up assist; reports need for suppository for BM   Transfers Chair/bed  transfer  Transfers assist     Chair/bed transfer assist level: Minimal Assistance - Patient > 75%     Locomotion Ambulation   Ambulation assist   Ambulation activity did not occur: Safety/medical concerns          Walk 10 feet activity   Assist  Walk 10 feet activity did not occur: Safety/medical concerns        Walk 50 feet activity   Assist Walk 50 feet with 2 turns activity did not occur: Safety/medical concerns         Walk 150 feet activity   Assist Walk 150 feet activity did not occur: Safety/medical concerns         Walk 10 feet on uneven surface  activity   Assist Walk 10 feet on uneven surfaces activity did not occur: Safety/medical concerns          Wheelchair     Assist Is the patient using a wheelchair?: Yes Type of Wheelchair: Manual    Wheelchair assist level: Supervision/Verbal cueing Max wheelchair distance: 200'    Wheelchair 50 feet with 2 turns activity    Assist        Assist Level: Supervision/Verbal cueing   Wheelchair 150 feet activity     Assist      Assist Level: Supervision/Verbal cueing   Blood pressure (!) 148/78, pulse 67, temperature 98.7 F (37.1 C), resp. rate 18, height _0  (1.727 m), weight 112.1 kg, SpO2 100 %.  Medical Problem List and Plan: 1.  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 Vertell Limber.  TLSO back brace when out of bed. JP drain removed 10/7.2022 11/4- d/c 11/11- pt ordered steady - con't PT and OT_ making gains on transfers- did stand pivot yesterday with therapy.  2.  Impaired mobility: continue lovenox.              -antiplatelet therapy: N/A 3. Postoperative spinal pain: Continue Voltaren 75 mg twice daily, Robaxin and oxycodone as needed. Added back dilaudid 7m q6H prn given severity of pain.   10/11-started ms contin 15 mg BID and stop Dliaudid and con't oxycodone- also add Cymbalta 30 mg QHS for nerve pain and mood.  10/20- increased Oxy to 10-15 mg q4 hours helped  Controlled with meds on 10/22 10/24- pt reports pain a little bette,r but still limiting- con't regimen for now  10/26- will change MS Contin to 6am and 6pm- see if that helps pain anymore for Am therapy esp  11/4- pain doing much better- con't regimen 4. Mood: Provide emotional support  10/10- pt appears depressed- will d/w pt in AM about starting something that could  also help pain.   10/11-added cymbalta 30 mg QHS. 10/17-increased Cymbalta to 60 mg QHS 10/26- mood brighter- con't regimen             -antipsychotic agents: N/A 5. Neuropsych: This patient is capable of making decisions on his own behalf. 6. Skin/Wound Care: Routine skin checks 7. Fluids/Electrolytes/Nutrition: Routine in and outs 8.  ID.  Transitioned from intravenous cefazolin to amoxicillin.    Check with ID when to stop Aomoxicllin- ~ 10/20- per chart-  call Dr WElba Barmanor ID  10/31 wbc's 4.6 9.  Acute blood loss anemia.  Latest hemoglobin 8.4.  Follow-up CBC Monday  Hemoglobin 9.3 on 10/17  11/3- Hb 10.1- con't to monitor 10.  Hyponatremia.  Sodium 136 on 10/10   10/31: 137 11.  Hypertension.   Continue Norvasc to 10 mg daily  11/3- will add Coreg- pulse can handle it- 3.125 mg BID for borderline high BP.   11/4- BP low 140s/70s today- con't regimen 12.  Irritable bowel syndrome.  Amitiza 24 mcg twice daily  11/3- going regularly with meds- con't regimen  13.  History of gout.  Continue colchicine 14.  OSA.  CPAP  10/26- getting CPAP nightly- con't regimen 15.  Obesity.  Dietary follow-up 16.  Hypoalbuminemia  Supplement initiated on 10/15 17.  Drug-induced constipation 11/4- LBM 2 days ago- if doesn't go, needs Sorbitol Saturday   LOS: 28 days A FACE TO FACE EVALUATION WAS PERFORMED  Marco Kennedy 05/01/2021, 8:33 AM

## 2021-05-02 NOTE — Progress Notes (Signed)
Occupational Therapy Session Note  Patient Details  Name: Marco Kennedy MRN: [id number] Date of Birth: 1951/02/14  Today's Date: 05/03/2021 OT Individual Time: 2706-2376 OT Individual Time Calculation (min): 59 min   Short Term Goals: Week 2:  OT Short Term Goal 1 (Week 2): pt will don shirt/TLSO Min A at EOB or up in w/c OT Short Term Goal 1 - Progress (Week 2): Progressing toward goal OT Short Term Goal 2 (Week 2): Pt will perform slide board transfer with Min A in prep for ADL OT Short Term Goal 2 - Progress (Week 2): Met OT Short Term Goal 3 (Week 2): Patient will don LB clothing at bed level with Mod A of 1 helper and AE PRN. OT Short Term Goal 3 - Progress (Week 2): Met OT Short Term Goal 4 (Week 2): Pt will perform sit <> stands in prep for ADL at [place] with  Mod A of 1 OT Short Term Goal 4 - Progress (Week 2): Progressing toward goal  Skilled Therapeutic Interventions/Progress Updates:    Pt greeted in the w/c and premedicated for pain. ADL needs were met. He wanted to work on seated strengthening and save standing for his PT session later. Pt self propelled the w/c to the dayroom using UEs and then transitioned to forward/backward self propulsion using B LEs. Pt required mod facilitation for moving the Lt LE. Transitioned to UB strengthening using 3# dumbbells x15-20 reps each exercise. Additional LE strengthening while using the kinetron x3-4 minutes x2 sets. Pt then self propelled back to the room using UEs. He remained sitting up, left with all needs within reach.   Therapy Documentation Precautions:  Precautions Precautions: Fall, Back Precaution Booklet Issued: No Precaution Comments: reviewed back precautions Required Braces or Orthoses: Spinal Brace Spinal Brace: Thoracolumbosacral orthotic, Applied in sitting position Spinal Brace Comments: TLSO when OOB only Restrictions Weight Bearing Restrictions: No  ADL: ADL Eating: Set up Grooming: Setup Where  Assessed-Grooming: Edge of bed (sitting EOB with Min to Mod A to maintain static sitting balance) Upper Body Bathing: Moderate assistance Where Assessed-Upper Body Bathing: Bed level Lower Body Bathing: Dependent Where Assessed-Lower Body Bathing: Bed level Upper Body Dressing: Maximal assistance Where Assessed-Upper Body Dressing: Edge of bed Lower Body Dressing: Dependent Where Assessed-Lower Body Dressing: Bed level Toileting: Setup Where Assessed-Toileting: Other (Comment) (Urinal) Toilet Transfer Method: Unable to assess Tub/Shower Transfer: Unable to assess  Therapy/Group: Individual Therapy  Seabron Iannello A Jarian Longoria 05/03/2021, 3:50 PM

## 2021-05-03 NOTE — Progress Notes (Signed)
PROGRESS NOTE   Subjective/Complaints:  Patient states that when he stood in steady he felt some more sensation in his legs.  Denies any breathing issues.  Had a good bowel movement this morning.   ROS:   Pt denies SOB, abd pain, CP, N/V/C/D,     Objective:   No results found. No results for input(s): WBC, HGB, HCT, PLT in the last 72 hours.    No results for input(s): NA, K, CL, CO2, GLUCOSE, BUN, CREATININE, CALCIUM in the last 72 hours.     Intake/Output Summary (Last 24 hours) at 05/03/2021 1137 Last data filed at 05/03/2021 0844 Gross per 24 hour  Intake 1300 ml  Output 700 ml  Net 600 ml          Physical Exam: Vital Signs Blood pressure 140/72, pulse 68, temperature 98.6 F (37 C), resp. rate 18, height 5\' 8"  (1.727 m), weight 112.1 kg, SpO2 96 %.   General: No acute distress Mood and affect are appropriate Heart: Regular rate and rhythm no rubs murmurs or extra sounds Lungs: Clear to auscultation, breathing unlabored, no rales or wheezes Abdomen: Positive bowel sounds, soft nontender to palpation, nondistended Extremities: No clubbing, cyanosis, or edema Skin: No evidence of breakdown, no evidence of rash  Musc: Lower extremity edema.  No tenderness in extremities. Motor: Bilateral upper extremities: 5/5 proximal distal Bilateral hip flexors trace knee extensors trace ankle dorsiflexor and plantar flexor trace Sensation to light touch is intact but proprioception is absent bilaterally  Assessment/Plan: 1. Functional deficits which require 3+ hours per day of interdisciplinary therapy in a comprehensive inpatient rehab setting. Physiatrist is providing close team supervision and 24 hour management of active medical problems listed below. Physiatrist and rehab team continue to assess barriers to discharge/monitor patient progress toward functional and medical goals  Care Tool:  Bathing     Body parts bathed by patient: Right arm, Left arm, Chest, Abdomen, Face, Front perineal area, Right upper leg, Left upper leg   Body parts bathed by helper: Buttocks, Right lower leg, Left lower leg     Bathing assist Assist Level: Moderate Assistance - Patient 50 - 74%     Upper Body Dressing/Undressing Upper body dressing   What is the patient wearing?: Pull over shirt, Orthosis    Upper body assist Assist Level: Moderate Assistance - Patient 50 - 74%    Lower Body Dressing/Undressing Lower body dressing      What is the patient wearing?: Underwear/pull up, Pants     Lower body assist Assist for lower body dressing: Maximal Assistance - Patient 25 - 49% (from sitting on toilet with use of reacher)     Toileting Toileting    Toileting assist Assist for toileting: Dependent - Patient 0% Assistive Device Comment: stedy to elevated toilet   Transfers Chair/bed transfer  Transfers assist     Chair/bed transfer assist level: Minimal Assistance - Patient > 75%     Locomotion Ambulation   Ambulation assist   Ambulation activity did not occur: Safety/medical concerns  Assist level: 2 helpers Assistive device: Lite Gait Max distance: 60 ft   Walk 10 feet activity   Assist  Walk  10 feet activity did not occur: Safety/medical concerns  Assist level: 2 helpers Assistive device: Lite Gait   Walk 50 feet activity   Assist Walk 50 feet with 2 turns activity did not occur: Safety/medical concerns  Assist level: 2 helpers Assistive device: Lite Gait    Walk 150 feet activity   Assist Walk 150 feet activity did not occur: Safety/medical concerns         Walk 10 feet on uneven surface  activity   Assist Walk 10 feet on uneven surfaces activity did not occur: Safety/medical concerns         Wheelchair     Assist Is the patient using a wheelchair?: Yes Type of Wheelchair: Manual    Wheelchair assist level: Supervision/Verbal cueing Max  wheelchair distance: 200'    Wheelchair 50 feet with 2 turns activity    Assist        Assist Level: Supervision/Verbal cueing   Wheelchair 150 feet activity     Assist      Assist Level: Supervision/Verbal cueing   Blood pressure 140/72, pulse 68, temperature 98.6 F (37 C), resp. rate 18, height 5\' 8"  (1.727 m), weight 112.1 kg, SpO2 96 %.  Medical Problem List and Plan: 1.  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 05/31/2021.  TLSO back brace when out of bed. JP drain removed 10/7.2022 Cont CIR PT, OT d/c 11/11- 2.  Impaired mobility: continue lovenox.              -antiplatelet therapy: N/A 3. Postoperative spinal pain: Continue Voltaren 75 mg twice daily, Robaxin and oxycodone as needed. Added back dilaudid 1mg  q6H prn given severity of pain.   10/11-started ms contin 15 mg BID and stop Dliaudid and con't oxycodone- also add Cymbalta 30 mg QHS for nerve pain and mood.  10/20- increased Oxy to 10-15 mg q4 hours helped  Controlled with meds on 10/22 10/24- pt reports pain a little bette,r but still limiting- con't regimen for now  10/26- will change MS Contin to 6am and 6pm- see if that helps pain anymore for Am therapy esp  11/4- pain doing much better- con't regimen 4. Mood: Provide emotional support  10/10- pt appears depressed- will d/w pt in AM about starting something that could also help pain.   10/11-added cymbalta 30 mg QHS. 10/17-increased Cymbalta to 60 mg QHS 10/26- mood brighter- con't regimen             -antipsychotic agents: N/A 5. Neuropsych: This patient is capable of making decisions on his own behalf. 6. Skin/Wound Care:  Routine skin checks 7. Fluids/Electrolytes/Nutrition: Routine in and outs 8.  ID.  Transitioned from intravenous cefazolin to amoxicillin.    Check with ID when to stop Aomoxicllin- ~ 10/20- per chart-  call Dr 11/26 or ID  10/31 wbc's 4.6 9.  Acute blood loss anemia.  Latest hemoglobin 8.4.  Follow-up CBC Monday  Hemoglobin 9.3 on 10/17  11/3- Hb 10.1- con't to monitor 10.  Hyponatremia.  Sodium 136 on 10/10   10/31: 137 11.  Hypertension.   Continue Norvasc to 10 mg daily  11/3- will add Coreg- pulse can handle it- 3.125 mg BID for borderline high BP.    Vitals:  05/02/21 1927 05/03/21 0533  BP: 119/60 140/72  Pulse: 68 68  Resp: 17 18  Temp: 98.2 F (36.8 C) 98.6 F (37 C)  SpO2: 98% 96%  Controlled 11/6 12.  Irritable bowel syndrome.  Amitiza 24 mcg twice daily  11/3- going regularly with meds- con't regimen  13.  History of gout.  Continue colchicine 14.  OSA.  CPAP  10/26- getting CPAP nightly- con't regimen 15.  Obesity.  Dietary follow-up 16.  Hypoalbuminemia  Supplement initiated on 10/15 17.  Drug-induced constipation Bowel movement this morning 11/6   LOS: 30 days A FACE TO FACE EVALUATION WAS PERFORMED  Erick Colace 05/03/2021, 11:37 AM

## 2021-05-03 NOTE — Progress Notes (Signed)
Slept good. Patient able to apply CPAP. Bilateral PRAFO's applied at HS. Bilateral, lateral heels red, but blanchable. Alfredo Martinez A

## 2021-05-03 NOTE — Progress Notes (Signed)
Physical Therapy Session Note  Patient Details  Name: Marco Kennedy MRN: [id number] Date of Birth: Feb 24, 1951  Today's Date: 05/03/2021 PT Individual Time: 1530-1630 PT Individual Time Calculation (min): 60 min   Short Term Goals: Week 4:  PT Short Term Goal 1 (Week 4): =LTGs d/t ELOS  Skilled Therapeutic Interventions/Progress Updates:    Pt received seated in w/c in room, agreeable to PT session. No complaints of pain this date. Manual w/c propulsion to/from dayroom at Supervision level with use of BUE. Slide board transfer w/c to/from mat table with CGA with cues for head/hips relationship. Sit to stand to RW with min A at least and mod A at most from 21" height mat table, use of airex to block knees. Sidesteps L/R 8 x 5" each direction with RW and mod A for balance, cues to widen BOS and for improved control when stepping, onset of knee buckling with fatigue. Pt continues to exhibit difficulty when taking steps with placing LE due to impaired sensation and proprioception. Standing mini-squats 2 x 10 reps, 1 x 15 reps with RW and min A for balance with knees blocked. Discussed car transfer with pt and that he could perform slide board or squat pivot if safe to sedan height car. Demonstrated what 26" looks like as this is currently shortest vehicle height he has available, significant height difference between 26" and his w/c so would be unsafe to attempt slide board. Will continue to work towards stand pivot transfers with RW for safe d/c home. Pt left seated in w/c in room with needs in reach, quick release belt in place at end of session.  Therapy Documentation Precautions:  Precautions Precautions: Fall, Back Precaution Booklet Issued: No Precaution Comments: reviewed back precautions Required Braces or Orthoses: Spinal Brace Spinal Brace: Thoracolumbosacral orthotic, Applied in sitting position Spinal Brace Comments: TLSO when OOB only Restrictions Weight Bearing Restrictions:  No       Therapy/Group: Individual Therapy   Peter Congo, PT, DPT, CSRS  05/03/2021, 5:14 PM

## 2021-05-04 NOTE — Progress Notes (Signed)
Occupational Therapy Session Note  Patient Details  Name: Marco Kennedy MRN: [id number] Date of Birth: 20-Jun-1951  Today's Date: 05/04/2021 OT Individual Time: 0900-1014 OT Individual Time Calculation (min): 74 min    Short Term Goals: Week 3:  OT Short Term Goal 1 (Week 3): Pt will perform squat pivot transfers to BSC/toilet with Mod A x2 OT Short Term Goal 1 - Progress (Week 3): Not met OT Short Term Goal 2 (Week 3): Pt will perform LB dress Min A with AE PRN bed level OT Short Term Goal 2 - Progress (Week 3): Progressing toward goal OT Short Term Goal 3 (Week 3): pt will don TLSO Min A at EOB OT Short Term Goal 3 - Progress (Week 3): Met Week 4:  OT Short Term Goal 1 (Week 4): STGs = LTGs 2/2 LOS   Skilled Therapeutic Interventions/Progress Updates:    Pt greeted at time of session supine in bed, no pain at rest, agreeable to OT session and to trial shower. Supine > sit Supervision/CGA and stedy bed > bathroom for shower transfer to bench. Doffed LB clothing in standing in stedy before seated on bench, back incision covered with waterproof dressing prior to shower. Shower level bathing Min A for buttocks only, pt able to use LHS for feet and back. Pt did have some pain seated on bench 2/2 lack of support in sitting and finishing bathing quickly, did not rate pain, but dried off seated before donning shirt/TLSO with  Min A and stedy > wheelchair. Seated rest break and pain subsided with support. Donned underwear and shorts with assist 2/2 no reacher available and donned over hips in stedy. Sink level oral hygiene before self propel room <> main gym for BUE therex with 7# bar for major muscle groups. Discussion throughout session regarding DC planning, family training with wife [name], and sequencing stedy tasks. Pt up in wheelchair call bell in reach all needs met.    Therapy Documentation Precautions:  Precautions Precautions: Fall, Back Precaution Booklet Issued: No Precaution  Comments: reviewed back precautions Required Braces or Orthoses: Spinal Brace Spinal Brace: Thoracolumbosacral orthotic, Applied in sitting position Spinal Brace Comments: TLSO when OOB only Restrictions Weight Bearing Restrictions: No     Therapy/Group: Individual Therapy  Viona Gilmore 05/04/2021, 7:22 AM

## 2021-05-04 NOTE — Progress Notes (Signed)
Physical Therapy Session Note  Patient Details  Name: Marco Kennedy MRN: [id number] Date of Birth: [birth year]  Today's Date: 05/04/2021 PT Individual Time: 1101-1157, 1400-1503 PT Individual Time Calculation (min): 56 min, 63 min  Short Term Goals: Week 1:  PT Short Term Goal 1 (Week 1): Pt will tolerate sitting upright x 5 min with no more than 5/10 pain PT Short Term Goal 1 - Progress (Week 1): Met PT Short Term Goal 2 (Week 1): Pt will initiate w/c mobility PT Short Term Goal 2 - Progress (Week 1): Met PT Short Term Goal 3 (Week 1): Pt will perform bed mobility with mod A consistently adhering to back precautions PT Short Term Goal 3 - Progress (Week 1): Met Week 2:  PT Short Term Goal 1 (Week 2): Pt will perform stand pivot transfer with RW PT Short Term Goal 1 - Progress (Week 2): Progressing toward goal PT Short Term Goal 2 (Week 2): Pt will perform STS with min A PT Short Term Goal 2 - Progress (Week 2): Progressing toward goal PT Short Term Goal 3 (Week 2): Pt will initiate gait training PT Short Term Goal 3 - Progress (Week 2): Not met Week 3:  PT Short Term Goal 1 (Week 3): Pt will perform stand pivot transfer PT Short Term Goal 1 - Progress (Week 3): Met PT Short Term Goal 2 (Week 3): pt will perform car transfer with any level of assist PT Short Term Goal 2 - Progress (Week 3): Progressing toward goal PT Short Term Goal 3 (Week 3): Pt will perform STS with min A PT Short Term Goal 3 - Progress (Week 3): Met Week 4:  PT Short Term Goal 1 (Week 4): =LTGs d/t ELOS  Skilled Therapeutic Interventions/Progress Updates:    Session 1: Pt seated in w/c on arrival and agreeable to therapy. Pt reports ~3/10 pain, premedicated. Pt reports pain is worst when not wearing brace (bed mobility and shower), so is much better with brace in place during therapy session. Pt propelled w/c with BUE to/from day room for functional mobility and endurance, pt is mod I with w/c mobility and brake,  requires assist for elevating leg rests but has standard leg rests at home that he is better able to manage. Session focused on stand pivot transfer and problem solving car transfer for home. Pt performed Sit to stand from w/c and mat table with min A x 1 at least and mod x2 at most. Pt requires knee block and assist for anterior weight shift to stand. Pt performed Stand pivot transfer x 5 throughout session with as little as mod A for balance and occ knee buckling with mod knee block to recover. Pt demoed anterior trunk lean and poor proprioception while stepping, which improved with repetition and verbal/visual cueing. Pt performed standing marches, Pt's R foot tends to step backward d/t poor proprioception and anterior trunk lean. Posture improved with multi modal cueing. Pt demoed improved proprioception with ankle weights during final bout of marching and final Stand pivot transfer. Pt returned to room and remained in w/c with all needs in reach.   Session 2: Pt seated in w/c on arrival and agreeable to therapy. Pt premedicated for pain and reported no increase with activity. Pt propelled w/c with BUE to ortho gym for endurance and functional mobility. Session focused on problem solving car transfer with car simulator. Pt performed Stand pivot transfer and squat pivot to car seat with max A of 2. Pt had difficulty  getting hips back onto seat when simulating the height of his vehicle. Therapist had to lower car to prevent pt from sliding forward. Pt sustained small scrape/pinch on his posterior R calf during transfer, nursing made aware to assess and dress if required. No bleeding or break in the skin noted on therapists assessment. Transitioned to continued practice with stand pivot transfers to RW, with as little as min A of 1 to stand, as much as mod x 2 d/t fatigue. Pt performed Stand pivot transfer x 2 with ankle weights for proprioception, with multimodal cues for upright posture, proprioception, and  sequencing. Pt then performed Sit to stand from w/c x 3 with instruction to push from lower part of arm rest with improved success, min A x 1. Pt returned to room and was left in w/c with all needs in reach.   Therapy Documentation Precautions:  Precautions Precautions: Fall, Back Precaution Booklet Issued: No Precaution Comments: reviewed back precautions Required Braces or Orthoses: Spinal Brace Spinal Brace: Thoracolumbosacral orthotic, Applied in sitting position Spinal Brace Comments: TLSO when OOB only Restrictions Weight Bearing Restrictions: No General:    Therapy/Group: Individual Therapy  Mickel Fuchs 05/04/2021, 7:57 AM

## 2021-05-04 NOTE — Progress Notes (Signed)
PROGRESS NOTE   Subjective/Complaints:  Pt reports things OK- walked with lite gait Friday- family training tomorrow Ate 100% breakfast.  Legs feel a little more sensation.   ROS:   Pt denies SOB, abd pain, CP, N/V/C/D, and vision changes   Objective:   No results found. No results for input(s): WBC, HGB, HCT, PLT in the last 72 hours.    No results for input(s): NA, K, CL, CO2, GLUCOSE, BUN, CREATININE, CALCIUM in the last 72 hours.     Intake/Output Summary (Last 24 hours) at 05/04/2021 1828 Last data filed at 05/04/2021 1316 Gross per 24 hour  Intake 480 ml  Output 950 ml  Net -470 ml         Physical Exam: Vital Signs Blood pressure 131/71, pulse 90, temperature 98 F (36.7 C), resp. rate 18, height 5\' 8"  (1.727 m), weight 112.1 kg, SpO2 98 %.    General: awake, alert, appropriate, sitting up in bed; ate 100% tray; NAD HENT: conjugate gaze; oropharynx moist CV: regular rate; no JVD Pulmonary: CTA B/L; no W/R/R- good air movement GI: soft, NT, ND, (+)BS; protuberant Psychiatric: appropriate Neurological: Ox3  Musc: Lower extremity edema.  No tenderness in extremities. Motor: Bilateral upper extremities: 5/5 proximal distal Bilateral hip flexors trace knee extensors trace ankle dorsiflexor and plantar flexor trace Sensation to light touch is intact but proprioception is absent bilaterally  Assessment/Plan: 1. Functional deficits which require 3+ hours per day of interdisciplinary therapy in a comprehensive inpatient rehab setting. Physiatrist is providing close team supervision and 24 hour management of active medical problems listed below. Physiatrist and rehab team continue to assess barriers to discharge/monitor patient progress toward functional and medical goals  Care Tool:  Bathing    Body parts bathed by patient: Right arm, Left arm, Chest, Abdomen, Face, Front perineal area, Right upper  leg, Left upper leg, Right lower leg, Left lower leg (with LHS)   Body parts bathed by helper: Buttocks     Bathing assist Assist Level: Minimal Assistance - Patient > 75%     Upper Body Dressing/Undressing Upper body dressing   What is the patient wearing?: Pull over shirt, Orthosis    Upper body assist Assist Level: Minimal Assistance - Patient > 75%    Lower Body Dressing/Undressing Lower body dressing      What is the patient wearing?: Underwear/pull up, Pants     Lower body assist Assist for lower body dressing: Maximal Assistance - Patient 25 - 49%     Toileting Toileting    Toileting assist Assist for toileting: Dependent - Patient 0% Assistive Device Comment: stedy to elevated toilet   Transfers Chair/bed transfer  Transfers assist     Chair/bed transfer assist level: Minimal Assistance - Patient > 75%     Locomotion Ambulation   Ambulation assist   Ambulation activity did not occur: Safety/medical concerns  Assist level: 2 helpers Assistive device: Lite Gait Max distance: 60 ft   Walk 10 feet activity   Assist  Walk 10 feet activity did not occur: Safety/medical concerns  Assist level: 2 helpers Assistive device: Lite Gait   Walk 50 feet activity   Assist Walk 50  feet with 2 turns activity did not occur: Safety/medical concerns  Assist level: 2 helpers Assistive device: Lite Gait    Walk 150 feet activity   Assist Walk 150 feet activity did not occur: Safety/medical concerns         Walk 10 feet on uneven surface  activity   Assist Walk 10 feet on uneven surfaces activity did not occur: Safety/medical concerns         Wheelchair     Assist Is the patient using a wheelchair?: Yes Type of Wheelchair: Manual    Wheelchair assist level: Supervision/Verbal cueing Max wheelchair distance: 200'    Wheelchair 50 feet with 2 turns activity    Assist        Assist Level: Supervision/Verbal cueing    Wheelchair 150 feet activity     Assist      Assist Level: Supervision/Verbal cueing   Blood pressure 131/71, pulse 90, temperature 98 F (36.7 C), resp. rate 18, height 5\' 8"  (1.727 m), weight 112.1 kg, SpO2 98 %.  Medical Problem List and Plan: 1.  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 05/31/2021.  TLSO back brace when out of bed. JP drain removed 10/7.2022 Con't PT and OT/CIR d/c 11/11 2.  Impaired mobility: continue lovenox.              -antiplatelet therapy: N/A 3. Postoperative spinal pain: Continue Voltaren 75 mg twice daily, Robaxin and oxycodone as needed. Added back dilaudid 1mg  q6H prn given severity of pain.   10/11-started ms contin 15 mg BID and stop Dliaudid and con't oxycodone- also add Cymbalta 30 mg QHS for nerve pain and mood.  10/20- increased Oxy to 10-15 mg q4 hours helped  Controlled with meds on 10/22 10/24- pt reports pain a little bette,r but still limiting- con't regimen for now  10/26- will change MS Contin to 6am and 6pm- see if that helps pain anymore for Am therapy esp  11/7- pain controlled- con't regimen 4. Mood: Provide emotional support  10/10- pt appears depressed- will d/w pt in AM about starting something that could also help pain.   10/11-added cymbalta 30 mg QHS. 10/17-increased Cymbalta to 60 mg QHS 10/26- mood brighter- con't regimen             -antipsychotic agents: N/A 5. Neuropsych: This patient is capable of making decisions on his own behalf. 6. Skin/Wound Care: Routine skin checks 7. Fluids/Electrolytes/Nutrition: Routine in and outs 8.  ID.  Transitioned from intravenous cefazolin to amoxicillin.     Check with ID when to stop Aomoxicllin- ~ 10/20- per chart-  call Dr 11/26 or ID  10/31 wbc's 4.6  11/7- will call ID tomorrow about Amoxicillin and get stop date 9.  Acute blood loss anemia.  Latest hemoglobin 8.4.  Follow-up CBC Monday  Hemoglobin 9.3 on 10/17  11/3- Hb 10.1- con't to monitor 10.  Hyponatremia.  Sodium 136 on 10/10   10/31: 137 11.  Hypertension.   Continue Norvasc to 10 mg daily  11/3- will add Coreg- pulse can handle it- 3.125 mg BID for borderline high BP.    Vitals:   05/04/21 0449 05/04/21 1337  BP: 127/72 131/71  Pulse: 61 90  Resp: 18 18  Temp:  98.5 F (36.9 C) 98 F (36.7 C)  SpO2: 98% 98%  Controlled 11/6  11/7- BP controlled- con't regimen 12.  Irritable bowel syndrome.  Amitiza 24 mcg twice daily  11/3- going regularly with meds- con't regimen  13.  History of gout.  Continue colchicine 14.  OSA.  CPAP  10/26- getting CPAP nightly- con't regimen 15.  Obesity.  Dietary follow-up 16.  Hypoalbuminemia  Supplement initiated on 10/15 17.  Drug-induced constipation Bowel movement this morning 11/6   LOS: 31 days A FACE TO FACE EVALUATION WAS PERFORMED  Damika Harmon 05/04/2021, 6:28 PM

## 2021-05-04 NOTE — Progress Notes (Signed)
Pt stated he could place self on cpap when ready for bed. RT told pt to call if he were to need help.

## 2021-05-05 ENCOUNTER — Encounter: Payer: BC Managed Care – PPO | Admitting: Registered Nurse

## 2021-05-05 NOTE — Progress Notes (Signed)
Orthopedic Tech Progress Note Patient Details:  Marco Kennedy December 18, 1950 [id number]  OT/PT will work with patient once they get him squared away I will call in order to a HANGER    Patient  ID: Marco Kennedy, male   DOB: Sep 10, 1950, 70 y.o.   MRN: [id number]  Marco Kennedy 05/05/2021, 10:19 AM

## 2021-05-05 NOTE — Progress Notes (Signed)
Pt states he can place himself on CPAP when ready for bed.

## 2021-05-05 NOTE — Progress Notes (Signed)
Inpatient Rehabilitation Care Coordinator Discharge Note   Patient Details  Name: Marco Kennedy MRN: [id number] Date of Birth: January 12, 1951   Discharge location: HOME WITH WIFE WHO CAN PROVIDE ASSIST  Length of Stay:  36 days  Discharge activity level: MIN-MOD LEVEL OF ASSIST  Home/community participation: YES  Patient response TW:[id number] Literacy - How often do you need to have someone help you when you read instructions, pamphlets, or other written material from your doctor or pharmacy?: Never  Patient response [id number] Isolation - How often do you feel lonely or isolated from those around you?: Never  Services provided included: MD, RD, PT, OT, RN, CM, TR, Pharmacy, SW  Financial Services:  Field seismologist Utilized: Barrister's clerk  Choices offered to/list presented to: PT AND WIFE  Follow-up services arranged:  Home Health, Patient/Family request agency HH/DME, DME Home Health Agency: [facility]-PT & OT    DME : [facility] 30 TRANSFER BOARD HH/DME Requested Agency: [facility]  Patient response to transportation need: Is the patient able to respond to transportation needs?: Yes In the past 12 months, has lack of transportation kept you from medical appointments or from getting medications?: No In the past 12 months, has lack of transportation kept you from meetings, work, or from getting things needed for daily living?: No    Comments (or additional information): WIFE WAS IN FOR EDUCATION AND THEY BROUGHT A STEDY FOR ASSIST WITH TRANSFERS. GAVE INFORMATION ON RENTABLE HANDICAPPED ACCESSIBLE VANS  Patient/Family verbalized understanding of follow-up arrangements:  Yes  Individual responsible for coordination of the follow-up plan: KATHY-WIFE  [phone]  Confirmed correct DME delivered: Lucy Chris 05/05/2021    Lottie Siska, Lemar Livings

## 2021-05-05 NOTE — Progress Notes (Signed)
[name], male   DOB: [birth year], 70 y.o.   MRN: [id number]  Wife in for education today and it went well they have purchased a stedy for home and this works well with his transfers. He is also able to use a transfer board for car transfers. Have ordered one via [employer]. Have reached out to [facility] for follow up and they will accept the referral due to he is known to them and has been seen before by them. Updated on team conference and both agreeable to discharge 11/11. Continue to work toward discharge friday

## 2021-05-05 NOTE — Patient Care Conference (Signed)
Inpatient RehabilitationTeam Conference and Plan of Care Update Date: 05/05/2021   Time: 11:39 AM    Patient Name: Marco Kennedy      Medical Record Number: [id number]  Date of Birth: 09-Jul-1950 Sex: Male         Room/Bed: 4W06C/4W06C-01 Payor Info: Payor: BLUE CROSS BLUE SHIELD / Plan: BCBS COMM PPO / Product Type: *No Product type* /    Admit Date/Time:  04/03/2021  2:44 PM  Primary Diagnosis:  Epidural abscess  Hospital Problems: Principal Problem:   Epidural abscess Active Problems:   Hypoalbuminemia due to protein-calorie malnutrition (HCC)   Essential hypertension   Acute blood loss anemia   Labile blood pressure   Postoperative pain   Pressure injury of skin   Drug induced constipation    Expected Discharge Date: Expected Discharge Date: 05/08/21  Team Members Present: Physician leading conference: Dr. Genice Rouge Social Worker Present: Dossie Der, LCSW Nurse Present: Kennyth Arnold, RN PT Present: Bernie Covey, PT OT Present: Earleen Newport, OT PPS Coordinator present : Fae Pippin, SLP     Current Status/Progress Goal Weekly Team Focus  Bowel/Bladder   Continent B/B. LBM 11/6. Continues to take scheduled colace, miralax & senokot. Using urinal.  Remain Continent.  assess q shift and adjust bowel regimen as needed.   Swallow/Nutrition/ Hydration             ADL's   CGA/Min slide board, Stedy transfers toilet and shower, Min A shower level bathing, back pain when unsupported, Mod/Max LB dress  downgraded LB to Mod A  family ed, continue AE training, standing balance, directing care, w/c level activity, ADL retraining   Mobility   CGA slideboard, min-mod squat pivot, mod-max sqaut pivot, STS with as little as min A  min A transfers, mod I w/c  transfers   Communication             Safety/Cognition/ Behavioral Observations            Pain   Pain managed with scheduled pain meds and occasional use of PRN Oxy ir 10mg 's.  Pain<3. Assess Q shift and  prn.  assess q shift & prn, pre medicate before therapies.   Skin   surgical incision to back with dressing. Bilateral, lateral heels red-PRAFO boots at HS. New place to right calf, foam dressing in place.  No New Skin Breakdown  assess q shift and prn     Discharge Planning:  family education with wife today. Plan for discharge Friday. Home health set up via [facility]. May need non-emergency ambulance transport home   Team Discussion: Decreased sensation. Continent B/B. Pain with activity. Knees still buckle. New blister to right calf due to car transfer. Discharging home with spouse. Contact guard/min assist with slide board. Contact guard with steady. Can do a car transfer in patient's other car with the slide board. Will need to discharge with therapy transferring into car. Will need slide board at discharge. Will talk to [facility] about bracing knees at some point. Patient on target to meet rehab goals: yes  *See Care Plan and progress notes for long and short-term goals.   Revisions to Treatment Plan:  Finalizing discharge plans/medications  Teaching Needs: Family education, medication/pain management, skin/wound care, safety awareness, transfer training, etc.  Current Barriers to Discharge: Decreased caregiver support, Home enviroment access/layout, Wound care, Lack of/limited family support, Weight, Weight bearing restrictions, and Medication compliance  Possible Resolutions to Barriers: Family education Order DME Therapy to assist with car transfer at  discharge HH established     Medical Summary Current Status: continent B/B- pain with activity and only takes prn meds 2x/day- kne3es buckle- R calf blister- from car transfer  Barriers to Discharge: Decreased family/caregiver support;Home enviroment access/layout;Weight;Medication compliance;Weight bearing restrictions;Wound care;Other (comments)  Barriers to Discharge Comments: family training- has to go into [place] and arrange own  transport home- pain with activity- needs sliding board transfer Possible Resolutions to Barriers/Weekly Focus: limitation- transfers doing better with STeady- will get Knee braces- locking for when might be able to stand/walk at this time- sensation/car transfer biggest limitation- needs sliding board- d/c 11/11/   Continued Need for Acute Rehabilitation Level of Care: The patient requires daily medical management by a physician with specialized training in physical medicine and rehabilitation for the following reasons: Direction of a multidisciplinary physical rehabilitation program to maximize functional independence : Yes Medical management of patient stability for increased activity during participation in an intensive rehabilitation regime.: Yes Analysis of laboratory values and/or radiology reports with any subsequent need for medication adjustment and/or medical intervention. : Yes   I attest that I was present, lead the team conference, and concur with the assessment and plan of the team.   Tennis Must 05/05/2021, 5:45 PM

## 2021-05-05 NOTE — Progress Notes (Signed)
Physical Therapy Session Note  Patient Details  Name: Marco Kennedy MRN: [id number] Date of Birth: 1951-04-05  Today's Date: 05/05/2021 PT Individual Time: 1000-1058, 1191-4782 PT Individual Time Calculation (min): 58 min, 28 min  Short Term Goals: Week 1:  PT Short Term Goal 1 (Week 1): Pt will tolerate sitting upright x 5 min with no more than 5/10 pain PT Short Term Goal 1 - Progress (Week 1): Met PT Short Term Goal 2 (Week 1): Pt will initiate w/c mobility PT Short Term Goal 2 - Progress (Week 1): Met PT Short Term Goal 3 (Week 1): Pt will perform bed mobility with mod A consistently adhering to back precautions PT Short Term Goal 3 - Progress (Week 1): Met Week 2:  PT Short Term Goal 1 (Week 2): Pt will perform stand pivot transfer with RW PT Short Term Goal 1 - Progress (Week 2): Progressing toward goal PT Short Term Goal 2 (Week 2): Pt will perform STS with min A PT Short Term Goal 2 - Progress (Week 2): Progressing toward goal PT Short Term Goal 3 (Week 2): Pt will initiate gait training PT Short Term Goal 3 - Progress (Week 2): Not met Week 3:  PT Short Term Goal 1 (Week 3): Pt will perform stand pivot transfer PT Short Term Goal 1 - Progress (Week 3): Met PT Short Term Goal 2 (Week 3): pt will perform car transfer with any level of assist PT Short Term Goal 2 - Progress (Week 3): Progressing toward goal PT Short Term Goal 3 (Week 3): Pt will perform STS with min A PT Short Term Goal 3 - Progress (Week 3): Met Week 4:  PT Short Term Goal 1 (Week 4): =LTGs d/t ELOS  Skilled Therapeutic Interventions/Progress Updates:    Session 1: Pt seated in w/c on arrival and agreeable to therapy. Premedicated for pain, no report of incr pain with activity. Pt's wife, Marco Kennedy, present throughout session for family education. Session focused on transfer training to ensure safe mobility at home. Pt propelled w/c with BUE throughout session, mod I. Pt demoed slideboard transfer and squat pivot  to/from mat table x 1. Supervision for slideboard transfer, requires assist to place board. Squat pivot with mod A, +2 for safety. Transitioned to ortho gym. Pt performed car transfer x 2 with slideboard and mod A for board placement, LE management, and scooting back into seat. Pt's wife expressed concern about using a different car than usual, and became slightly emotional. Therapist provided emotional support and discussed that using a slideboard transfer to a lower vehicle is the safest option. Pt and Marco Kennedy then performed squat pivot transfer to/from mat table with therapist providing CGA for safety. Marco Kennedy provided appropriate level of assist with all transfers. Discussed and problem solved car transfers further at end of session. Pt returned to room and remained in w/c with all needs in reach.  Session 2: Pt seated in w/c on arrival and agreeable to therapy. Premedicated, no c/o pain requiring further intervention during session. Pt transported to therapy gym for time management and energy conservation. Session focused on finetuning car transfer for safety at home. Car transfer x 2 with mod A to place slideboard and assist with LE. Discussed sequencing for best outcomes and precautions to take during transfer. Pt propelled w/c with BUE back to room and was left with all needs in reach and alarm active.   Therapy Documentation Precautions:  Precautions Precautions: Fall, Back Precaution Booklet Issued: No Precaution Comments: reviewed back  precautions Required Braces or Orthoses: Spinal Brace Spinal Brace: Thoracolumbosacral orthotic, Applied in sitting position Spinal Brace Comments: TLSO when OOB only Restrictions Weight Bearing Restrictions: No General:      Therapy/Group: Individual Therapy  Mickel Fuchs 05/05/2021, 12:10 PM

## 2021-05-05 NOTE — Progress Notes (Signed)
PROGRESS NOTE   Subjective/Complaints:  Pt reports wife coming for family training today.  Steady came to house- needs to be put together.  Cut back to oxy 10 mg BID- we discussed how to wean long term- with goal to decrease MS contin to 1x/day in next few weeks, then so on.  - got shower yesterday  ROS:   Pt denies SOB, abd pain, CP, N/V/C/D, and vision changes    Objective:   No results found. No results for input(s): WBC, HGB, HCT, PLT in the last 72 hours.    No results for input(s): NA, K, CL, CO2, GLUCOSE, BUN, CREATININE, CALCIUM in the last 72 hours.     Intake/Output Summary (Last 24 hours) at 05/05/2021 1026 Last data filed at 05/05/2021 0758 Gross per 24 hour  Intake 1040 ml  Output 1850 ml  Net -810 ml         Physical Exam: Vital Signs Blood pressure (!) 146/83, pulse 72, temperature 98 F (36.7 C), temperature source Oral, resp. rate 18, height 5\' 8"  (1.727 m), weight 112.1 kg, SpO2 98 %.     General: awake, alert, appropriate, NAD HENT: conjugate gaze; oropharynx moist CV: regular rate; no JVD Pulmonary: CTA B/L; no W/R/R- good air movement GI: soft, NT, ND, (+)BS Psychiatric: appropriate Neurological: Ox3 MS: RLE_ HF 4-/5; KE 3-/5l DF 3+/5 PF 4+/5 LLE- HF 2+/5; KE 2/5; DF 2+/5 and PF 3+/5 Extremities- no edema and wearing PRAFOs B/L  Musc: Lower extremity edema.  No tenderness in extremities. Sensation to light touch is intact but proprioception is absent bilaterally  Assessment/Plan: 1. Functional deficits which require 3+ hours per day of interdisciplinary therapy in a comprehensive inpatient rehab setting. Physiatrist is providing close team supervision and 24 hour management of active medical problems listed below. Physiatrist and rehab team continue to assess barriers to discharge/monitor patient progress toward functional and medical goals  Care Tool:  Bathing    Body  parts bathed by patient: Right arm, Left arm, Chest, Abdomen, Face, Front perineal area, Right upper leg, Left upper leg, Right lower leg, Left lower leg (with LHS)   Body parts bathed by helper: Buttocks     Bathing assist Assist Level: Minimal Assistance - Patient > 75%     Upper Body Dressing/Undressing Upper body dressing   What is the patient wearing?: Pull over shirt, Orthosis    Upper body assist Assist Level: Minimal Assistance - Patient > 75%    Lower Body Dressing/Undressing Lower body dressing      What is the patient wearing?: Underwear/pull up, Pants     Lower body assist Assist for lower body dressing: Maximal Assistance - Patient 25 - 49%     Toileting Toileting    Toileting assist Assist for toileting: Dependent - Patient 0% Assistive Device Comment: stedy to elevated toilet   Transfers Chair/bed transfer  Transfers assist     Chair/bed transfer assist level: Minimal Assistance - Patient > 75%     Locomotion Ambulation   Ambulation assist   Ambulation activity did not occur: Safety/medical concerns  Assist level: 2 helpers Assistive device: Lite Gait Max distance: 60 ft   Walk 10  feet activity   Assist  Walk 10 feet activity did not occur: Safety/medical concerns  Assist level: 2 helpers Assistive device: Lite Gait   Walk 50 feet activity   Assist Walk 50 feet with 2 turns activity did not occur: Safety/medical concerns  Assist level: 2 helpers Assistive device: Lite Gait    Walk 150 feet activity   Assist Walk 150 feet activity did not occur: Safety/medical concerns         Walk 10 feet on uneven surface  activity   Assist Walk 10 feet on uneven surfaces activity did not occur: Safety/medical concerns         Wheelchair     Assist Is the patient using a wheelchair?: Yes Type of Wheelchair: Manual    Wheelchair assist level: Supervision/Verbal cueing Max wheelchair distance: 200'    Wheelchair 50 feet  with 2 turns activity    Assist        Assist Level: Supervision/Verbal cueing   Wheelchair 150 feet activity     Assist      Assist Level: Supervision/Verbal cueing   Blood pressure (!) 146/83, pulse 72, temperature 98 F (36.7 C), temperature source Oral, resp. rate 18, height 5\' 8"  (1.727 m), weight 112.1 kg, SpO2 98 %.  Medical Problem List and Plan: 1.  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 05/31/2021.  TLSO back brace when out of bed. JP drain removed 10/7.2022 Con't PT and OT_ family training today- wife has to put steady together- plan for d/c 11/11 2.  Impaired mobility: continue lovenox.              -antiplatelet therapy: N/A 3. Postoperative spinal pain: Continue Voltaren 75 mg twice daily, Robaxin and oxycodone as needed. Added back dilaudid 1mg  q6H prn given severity of pain.   10/11-started ms contin 15 mg BID and stop Dliaudid and con't oxycodone- also add Cymbalta 30 mg QHS for nerve pain and mood.  10/20- increased Oxy to 10-15 mg q4 hours helped  Controlled with meds on 10/22 10/24- pt reports pain a little bette,r but still limiting- con't regimen for now  10/26- will change MS Contin to 6am and 6pm- see if that helps pain anymore for Am therapy esp  11/8- will send home on Oxycodone 5 mg tabs 5 mg QID prn and MS contin- I will wean after d/c- goal to not take long term 4. Mood: Provide emotional support  10/10- pt appears depressed- will d/w pt in AM about starting something that could also help pain.   10/11-added cymbalta 30 mg QHS. 10/17-increased Cymbalta to 60 mg QHS 10/26- mood brighter- con't regimen              -antipsychotic agents: N/A 5. Neuropsych: This patient is capable of making decisions on his own behalf. 6. Skin/Wound Care: Routine skin checks 7. Fluids/Electrolytes/Nutrition: Routine in and outs 8.  ID.  Transitioned from intravenous cefazolin to amoxicillin.    Check with ID when to stop Aomoxicllin- ~ 10/20- per chart-  call Dr 11/26 or ID  10/31 wbc's 4.6  11/7- will call ID tomorrow about Amoxicillin and get stop date 9.  Acute blood loss anemia.  Latest hemoglobin 8.4.  Follow-up CBC Monday  Hemoglobin 9.3  on 10/17  11/3- Hb 10.1- con't to monitor 10.  Hyponatremia.  Sodium 136 on 10/10   10/31: 137 11.  Hypertension.   Continue Norvasc to 10 mg daily  11/3- will add Coreg- pulse can handle it- 3.125 mg BID for borderline high BP.    Vitals:   05/05/21 0321 05/05/21 0755  BP: (!) 147/81 (!) 146/83  Pulse: 71 72  Resp: 18   Temp: 98 F (36.7 C)   SpO2: 98%   Controlled 11/6  11/7- BP controlled- con't regimen  11/8- pt wasn't on BP meds prior- explained PCP will need to wean IF POSSIBLE 12.  Irritable bowel syndrome.  Amitiza 24 mcg twice daily  11/3- going regularly with meds- con't regimen  13.  History of gout.  Continue colchicine 14.  OSA.  CPAP  10/26- getting CPAP nightly- con't regimen 15.  Obesity.  Dietary follow-up 16.  Hypoalbuminemia  Supplement initiated on 10/15 17.  Drug-induced constipation  11/8- going regularly- plans on today.    I spent a total of 43 minutes on total care today- >50% coordination of care- speaking with pt about d/c plans; weaning schedule; OT and PT about possible knees braces to stand/walk with H/H and team conference  LOS: 32 days A FACE TO FACE EVALUATION WAS PERFORMED  Kc Summerson 05/05/2021, 10:26 AM

## 2021-05-05 NOTE — Progress Notes (Signed)
Occupational Therapy Session Note  Patient Details  Name: Marco Kennedy MRN: [id number] Date of Birth: May 10, 1951  Today's Date: 05/05/2021 OT Individual Time: 1102-1117 and 1530-1630 OT Individual Time Calculation (min): 73 min and 60 min   Short Term Goals: Week 3:  OT Short Term Goal 1 (Week 3): Pt will perform squat pivot transfers to BSC/toilet with Mod A x2 OT Short Term Goal 1 - Progress (Week 3): Not met OT Short Term Goal 2 (Week 3): Pt will perform LB dress Min A with AE PRN bed level OT Short Term Goal 2 - Progress (Week 3): Progressing toward goal OT Short Term Goal 3 (Week 3): pt will don TLSO Min A at EOB OT Short Term Goal 3 - Progress (Week 3): Met Week 4:  OT Short Term Goal 1 (Week 4): STGs = LTGs 2/2 LOS   Skilled Therapeutic Interventions/Progress Updates:    Session 1: Pt greeted at time of session finishing Stedy transfer with NT toilet > w/c, OT to resume care. Discussion with pt regarding focus of family training for today. Pt already dressed and ADL needs met. Oral hygiene and shaving tasks seated in wheelchair at beginning of session while awaiting wife to arrive. Once wife [name] present for family ed, focused on stedy transfers to/from bed <> wheelchair, toilet, TTB in shower, etc. With wife maneuvering Stedy and adjusting settings, paddles, environment, etc. At bed level, simulated dressing tasks (pt already dressed) to demonstrate for wife threading feet and pt rolling with Supervision to demonstrate how to don over hips. Wife able to demonstrate donning/doffing TLSO providing assist when needed, and verbally recalling how to assist pt with hygiene and toileting tasks. Pt self propel room <> day room and focused on sit <> stands x2 at RW with second helper posterior assist and therapist blocking knees for support to demonstrate for wife pt progress thus far. Back in room set up with call bell in reach and wife present for PT family ed.   Session 2: Pt greeted at  time of session up in chair agreeable to OT session and no pain reported. Pt feeling good about stedy transfers but wanting to work on car transfers with slide board. Pt self propel room <> ortho gym with Supervision and OT taking over when fatigued. Slide board transfer w/c <> car at 21 inch height to simulate personal car, Min A to get into car and CGA back to wheelchair, having pt direct his care in prep for wife. Seated in wheelchair focused on BLE NMR for hitting targets of various colors in sequence of 1-3 colors to improve hip flexion and quad activation as well. 1x20 rebounder standard throws, 1x20 throws + overhead press w/ 4# weights on wrists. Self propel back to room and set up call bell in reach and all needs met.   Therapy Documentation Precautions:  Precautions Precautions: Fall, Back Precaution Booklet Issued: No Precaution Comments: reviewed back precautions Required Braces or Orthoses: Spinal Brace Spinal Brace: Thoracolumbosacral orthotic, Applied in sitting position Spinal Brace Comments: TLSO when OOB only Restrictions Weight Bearing Restrictions: No     Therapy/Group: Individual Therapy  Viona Gilmore 05/05/2021, 7:23 AM

## 2021-05-06 NOTE — Progress Notes (Addendum)
Inpatient Rehabilitation Discharge Medication Review by a Pharmacist  A complete drug regimen review was completed for this patient to identify any potential clinically significant medication issues.  High Risk Drug Classes Is patient taking? Indication by Medication  Antipsychotic No   Anticoagulant Yes Lovenox for VTE ppx until 07/01/21  Antibiotic No Po Amoxicillin for epidural abscess now stopped  Opioid Yes Oxycodone / MS Contin for pain   Antiplatelet No   Hypoglycemics/insulin No   Vasoactive Medication Yes Carvedilol, amlodipine for BP   Chemotherapy No   Other Yes Cymbalta for mood/pain Zanaflex for muscle spasms     Type of Medication Issue Identified Description of Issue Recommendation(s)  Drug Interaction(s) (clinically significant)     Duplicate Therapy     Allergy     No Medication Administration End Date     Incorrect Dose     Additional Drug Therapy Needed     Significant med changes from prior encounter (inform family/care partners about these prior to discharge).    Other       Clinically significant medication issues were identified that warrant physician communication and completion of prescribed/recommended actions by midnight of the next day:  No  Pharmacist comments: Amoxicillin to continue  Time spent performing this drug regimen review (minutes):  20 mintues   Elwin Sleight 05/06/2021 9:02 AM

## 2021-05-06 NOTE — Progress Notes (Signed)
Physical Therapy Session Note  Patient Details  Name: Marco Kennedy MRN: [id number] Date of Birth: [birth year]  Today's Date: 05/06/2021 PT Individual Time: 1100-1200, 1300-1405 PT Individual Time Calculation (min): 60 min, 65 min  Short Term Goals: Week 1:  PT Short Term Goal 1 (Week 1): Pt will tolerate sitting upright x 5 min with no more than 5/10 pain PT Short Term Goal 1 - Progress (Week 1): Met PT Short Term Goal 2 (Week 1): Pt will initiate w/c mobility PT Short Term Goal 2 - Progress (Week 1): Met PT Short Term Goal 3 (Week 1): Pt will perform bed mobility with mod A consistently adhering to back precautions PT Short Term Goal 3 - Progress (Week 1): Met Week 2:  PT Short Term Goal 1 (Week 2): Pt will perform stand pivot transfer with RW PT Short Term Goal 1 - Progress (Week 2): Progressing toward goal PT Short Term Goal 2 (Week 2): Pt will perform STS with min A PT Short Term Goal 2 - Progress (Week 2): Progressing toward goal PT Short Term Goal 3 (Week 2): Pt will initiate gait training PT Short Term Goal 3 - Progress (Week 2): Not met Week 3:  PT Short Term Goal 1 (Week 3): Pt will perform stand pivot transfer PT Short Term Goal 1 - Progress (Week 3): Met PT Short Term Goal 2 (Week 3): pt will perform car transfer with any level of assist PT Short Term Goal 2 - Progress (Week 3): Progressing toward goal PT Short Term Goal 3 (Week 3): Pt will perform STS with min A PT Short Term Goal 3 - Progress (Week 3): Met Week 4:  PT Short Term Goal 1 (Week 4): =LTGs d/t ELOS  Skilled Therapeutic Interventions/Progress Updates:    Session 1: Pt seated in w/c on arrival and agreeable to therapy. Pt premedicated, no c/o increased pain with activity. Pt propelled w/c with BUE to day room for functional mobility. Pt was then set up with lite gait for gait training. Pt ambulated x45 ft and x120 ft with lite gait. Pt had difficulty getting his weight over BLE during gait, so maintained BLE  in front of COM during gait. Pt with occ inversion of LLE, 2nd bout of gait terminated d/t being unable to correct. Pt then performed min A squat pivot to mat table. Discussed finetuning foot position for easiest transfer. Pt then stood to RW with min A X 1at least, mod A X1  at most x 4, with knee block for safety and facilitation of anterior weight shift. Pt performed mini squats 3 x 5 to improve LE strength and endurance. Pt returned to room and remained in w/c with all needs in reach.  Session 2: Pt seated in w/c on arrival and agreeable to therapy. Premedicated, no c/o increased pain with activity. Pt propelled w/c with BUE to ortho gym for endurance. Pt then performed car transfer x 2 with mod A to place board and manage LE. Discussed safe technique for board placement and sequencing of events. Rest of session focused on orthotic consult, including multiple Sit to stands with as little as min A. Noted hip contracture preventing upright posture, and discussed ways to stretch hip flexors using bed features. Therapist and orthotist believe anterior ground reaction AFOs to be most appropriate. Pt returned to room and remained seated in w/c with all needs in reach and nsg present.   Therapy Documentation Precautions:  Precautions Precautions: Fall, Back Precaution Booklet Issued: No Precaution  Comments: reviewed back precautions Required Braces or Orthoses: Spinal Brace Spinal Brace: Thoracolumbosacral orthotic, Applied in sitting position Spinal Brace Comments: TLSO when OOB only Restrictions Weight Bearing Restrictions: No     Therapy/Group: Individual Therapy  Mickel Fuchs 05/06/2021, 12:23 PM

## 2021-05-06 NOTE — Progress Notes (Signed)
Patient states he will apply CPAP when he goes to bed. RT will continue to monitor.

## 2021-05-06 NOTE — Progress Notes (Signed)
PROGRESS NOTE   Subjective/Complaints:  Pt reports family education went well- car transfer with different car much better- Seen by [employer]- thinks he'd benefit from AFO's- will order.     ROS:   Pt denies SOB, abd pain, CP, N/V/C/D, and vision changes   Objective:   No results found. No results for input(s): WBC, HGB, HCT, PLT in the last 72 hours.    No results for input(s): NA, K, CL, CO2, GLUCOSE, BUN, CREATININE, CALCIUM in the last 72 hours.     Intake/Output Summary (Last 24 hours) at 05/06/2021 1940 Last data filed at 05/06/2021 1852 Gross per 24 hour  Intake 780 ml  Output 750 ml  Net 30 ml         Physical Exam: Vital Signs Blood pressure 124/60, pulse 78, temperature 98.1 F (36.7 C), temperature source Oral, resp. rate 16, height 5\' 8"  (1.727 m), weight 112.1 kg, SpO2 100 %.      General: awake, alert, appropriate, NAD HENT: conjugate gaze; oropharynx moist CV: regular rate; no JVD Pulmonary: CTA B/L; no W/R/R- good air movement GI: soft, NT, ND, (+)BS Psychiatric: appropriate Neurological: Ox3  MS: RLE_ HF 4-/5; KE 3-/5l DF 3+/5 PF 4+/5 LLE- HF 2+/5; KE 2/5; DF 2+/5 and PF 3+/5 Extremities- no edema and wearing PRAFOs B/L  Musc: Lower extremity edema.  No tenderness in extremities. Sensation to light touch is intact but proprioception is absent bilaterally  Assessment/Plan: 1. Functional deficits which require 3+ hours per day of interdisciplinary therapy in a comprehensive inpatient rehab setting. Physiatrist is providing close team supervision and 24 hour management of active medical problems listed below. Physiatrist and rehab team continue to assess barriers to discharge/monitor patient progress toward functional and medical goals  Care Tool:  Bathing    Body parts bathed by patient: Right arm, Left arm, Chest, Abdomen, Face, Front perineal area, Right upper leg, Left upper leg,  Right lower leg, Left lower leg (with LHS)   Body parts bathed by helper: Buttocks     Bathing assist Assist Level: Minimal Assistance - Patient > 75%     Upper Body Dressing/Undressing Upper body dressing   What is the patient wearing?: Pull over shirt, Orthosis    Upper body assist Assist Level: Minimal Assistance - Patient > 75%    Lower Body Dressing/Undressing Lower body dressing      What is the patient wearing?: Underwear/pull up, Pants     Lower body assist Assist for lower body dressing: Maximal Assistance - Patient 25 - 49%     Toileting Toileting    Toileting assist Assist for toileting: Dependent - Patient 0% Assistive Device Comment: stedy to elevated toilet   Transfers Chair/bed transfer  Transfers assist     Chair/bed transfer assist level: Minimal Assistance - Patient > 75%     Locomotion Ambulation   Ambulation assist   Ambulation activity did not occur: Safety/medical concerns  Assist level: 2 helpers Assistive device: Lite Gait Max distance: 120 ft   Walk 10 feet activity   Assist  Walk 10 feet activity did not occur: Safety/medical concerns  Assist level: 2 helpers Assistive device: Lite Gait   Walk  50 feet activity   Assist Walk 50 feet with 2 turns activity did not occur: Safety/medical concerns  Assist level: 2 helpers Assistive device: Lite Gait    Walk 150 feet activity   Assist Walk 150 feet activity did not occur: Safety/medical concerns         Walk 10 feet on uneven surface  activity   Assist Walk 10 feet on uneven surfaces activity did not occur: Safety/medical concerns         Wheelchair     Assist Is the patient using a wheelchair?: Yes Type of Wheelchair: Manual    Wheelchair assist level: Supervision/Verbal cueing Max wheelchair distance: 200'    Wheelchair 50 feet with 2 turns activity    Assist        Assist Level: Supervision/Verbal cueing   Wheelchair 150 feet  activity     Assist      Assist Level: Supervision/Verbal cueing   Blood pressure 124/60, pulse 78, temperature 98.1 F (36.7 C), temperature source Oral, resp. rate 16, height 5\' 8"  (1.727 m), weight 112.1 kg, SpO2 100 %.  Medical Problem List and Plan: 1.  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 05/31/2021.  TLSO back brace when out of bed. JP drain removed 10/7.2022 Con't PT and OT- Will place AFO consult-  2.  Impaired mobility: continue lovenox.              -antiplatelet therapy: N/A 3. Postoperative spinal pain: Continue Voltaren 75 mg twice daily, Robaxin and oxycodone as needed. Added back dilaudid 1mg  q6H prn given severity of pain.   10/11-started ms contin 15 mg BID and stop Dliaudid and con't oxycodone- also add Cymbalta 30 mg QHS for nerve pain and mood.  10/20- increased Oxy to 10-15 mg q4 hours helped  Controlled with meds on 10/22 10/24- pt reports pain a little bette,r but still limiting- con't regimen for now  10/26- will change MS Contin to 6am and 6pm- see if that helps pain anymore for Am therapy esp  11/8- will send home on Oxycodone 5 mg tabs 5 mg QID prn and MS contin- I will wean after d/c- goal to not take long term 4. Mood: Provide emotional support  10/10- pt appears depressed- will d/w pt in AM about starting something that could also help pain.   10/11-added cymbalta 30 mg QHS. 10/17-increased Cymbalta to 60 mg QHS 10/26- mood brighter- con't regimen             -antipsychotic agents: N/A 5. Neuropsych: This patient is capable of making decisions on his own behalf. 6. Skin/Wound Care: Routine skin checks 7.  Fluids/Electrolytes/Nutrition: Routine in and outs 8.  ID.  Transitioned from intravenous cefazolin to amoxicillin.    Check with ID when to stop Aomoxicllin- ~ 10/20- per chart-  call Dr 11/26 or ID  10/31 wbc's 4.6  11/7- will call ID tomorrow about Amoxicillin and get stop date \ 11/9- are able to be stoppped- is done 9.  Acute blood loss anemia.  Latest hemoglobin 8.4.  Follow-up CBC Monday  Hemoglobin 9.3 on 10/17  11/3- Hb 10.1- con't to monitor 10.  Hyponatremia.  Sodium 136 on 10/10   10/31: 137 11.  Hypertension.   Continue  Norvasc to 10 mg daily  11/3- will add Coreg- pulse can handle it- 3.125 mg BID for borderline high BP.    Vitals:   05/06/21 1657 05/06/21 1939  BP: 93/60 124/60  Pulse: 76 78  Resp: 16 16  Temp: 98.5 F (36.9 C) 98.1 F (36.7 C)  SpO2: 98% 100%  Controlled 11/6  11/7- BP controlled- con't regimen  11/8- pt wasn't on BP meds prior- explained PCP will need to wean IF POSSIBLE 12.  Irritable bowel syndrome.  Amitiza 24 mcg twice daily  11/3- going regularly with meds- con't regimen  13.  History of gout.  Continue colchicine 14.  OSA.  CPAP  10/26- getting CPAP nightly- con't regimen 15.  Obesity.  Dietary follow-up 16.  Hypoalbuminemia  Supplement initiated on 10/15 17.  Drug-induced constipation  11/8- going regularly- plans on today.     LOS: 33 days A FACE TO FACE EVALUATION WAS PERFORMED  Heidee Audi 05/06/2021, 7:40 PM

## 2021-05-06 NOTE — Progress Notes (Deleted)
Pharmacy - Amoxicillin  Planning to continue amoxicillin until follow up appointment at least with ID. Discharge with 2 weeks of therapy per ID  Thank you. Okey Regal, PharmD

## 2021-05-06 NOTE — Discharge Summary (Signed)
Physician Discharge Summary  Patient ID: BUB FALKOWITZ MRN: [id number] DOB/AGE: [birth year] 70 y.o.  Admit date: 04/03/2021 Discharge date: 05/08/2021  Discharge Diagnoses:  Principal Problem:   Epidural abscess Active Problems:   Hypoalbuminemia due to protein-calorie malnutrition (HCC)   Essential hypertension   Acute blood loss anemia   Labile blood pressure   Postoperative pain   Pressure injury of skin   Drug induced constipation Irritable bowel syndrome OSA Obesity  Discharged Condition: Stable  Significant Diagnostic Studies: No results found.  Labs:  Basic Metabolic Panel: No results for input(s): NA, K, CL, CO2, GLUCOSE, BUN, CREATININE, CALCIUM, MG, PHOS in the last 168 hours.  CBC: No results for input(s): WBC, NEUTROABS, HGB, HCT, MCV, PLT in the last 168 hours.  CBG: No results for input(s): GLUCAP in the last 168 hours.  Family history.  Mother with hypertension as well as arthritis.  Father with stomach cancer Brother with hypertension.  Denies any colon cancer esophageal cancer or rectal cancer  Brief HPI:   CHRYS PIEHLER is a 70 y.o. right-handed male with history of obesity, OSA maintained on CPAP and recent admission 01/10/2021 to 01/16/2021 for sepsis secondary to moderate-sized abscess near the pelvic hardware.  Neurosurgery interventional radiology and infectious disease were involved in his care at that point underwent wound debridement with neurosurgery 7/19 with E. coli from wound cultures.  He was given cefazolin with plan of 6-week treatment.  He was discharged to home noted bilateral rib pain needing to sleep in a lift chair.  Home health nurse 7/25 to assess the PICC line recommended follow-up PCP they decided to try telehealth but by that time patient was noted to have some coughing and nonspecific chest discomfort.  He developed low-grade fevers was admitted 01/20/2021.  He did undergo bilateral pigtail catheters placed for pleural cavity.   Neurosurgery follow-up in regards to osteomyelitis no current plan for repeat surgical intervention at that time was maintained on cefazolin and most recent blood cultures negative.  He was admitted to inpatient rehab services 02/03/2021 to 02/16/2021 for vertebral osteomyelitis.  He was discharged home ambulating 100 feet contact-guard assist.  Presented 03/14/2021 for worsening discitis as well as enlarging epidural abscess T9-10 resulting in spinal stenosis.  Patient underwent emergent T9-10 laminectomy decompression of thecal sac 03/14/2021 per Dr. Kathyrn Sheriff.  Hospital course follow-up neurosurgery Dr. Vertell Limber patient returned to the OR for reconstruction and correction of thoracic kyphosis and decompression of thoracic spinal cord 03/31/2021 undergoing T6-T9 posterior decompression extension of fusion to thoracic 11 decompression of spinal cord dura T9-11 laminectomy posterior lateral arthrodesis as well as placement of Jackson-Pratt drain.  TLSO back brace when out of bed.  Infectious disease consulted transition from intravenous cefazolin to amoxicillin.  Maintained on Lovenox for DVT prophylaxis.  Therapy evaluations completed due to patient decreased functional mobility was admitted for a comprehensive rehab program.   Hospital Course: MAHAD CRILLY was admitted to rehab 04/03/2021 for inpatient therapies to consist of PT, ST and OT at least three hours five days a week. Past admission physiatrist, therapy team and rehab RN have worked together to provide customized collaborative inpatient rehab.  Pertaining to patient's bilateral lower extremity paresthesias secondary to thoracic epidural abscess status post T9-10 laminectomy decompression reconstruction T6-10 posterior decompression fusion removal of hardware extension and fusion 03/31/2021.  Patient would follow-up neurosurgery.  Back brace when out of bed.  JP drain removed 04/03/2021.  Maintained on Lovenox for DVT prophylaxis.  Hospital course pain  management  with the use of Voltaren, Cymbalta 60 mg nightly as well as Robaxin for muscle spasms.  Scheduled MS Contin was used as well as oxycodone for breakthrough pain.  Zanaflex scheduled for spasticity.  Infectious disease follow-up he was transition from intravenous cefazolin to amoxicillin awaiting plan by infectious disease on discontinuation of antibiotic.  Acute blood loss anemia stable no bleeding episodes.  Blood pressure controlled on current regimen and monitored when out of bed.  Irritable bowel syndrome maintained on Amitiza.  History of OSA CPAP as directed.  Noted obesity with dietary follow-up.  Drug-induced constipation resolved with laxative assistance.   Blood pressures were monitored on TID basis and   Diabetes has been monitored with ac/hs CBG checks and SSI was use prn for tighter BS control.    Rehab course: During patient's stay in rehab weekly team conferences were held to monitor patient's progress, set goals and discuss barriers to discharge. At admission, patient required max assist sit to stand moderate assist sit to supine moderate assist sit to side-lying  Physical exam.  Blood pressure 118/70 pulse 80 temperature 98 respirations 18 oxygen saturations 92% room air Constitutional.  No acute distress HEENT Head.  Normocephalic and atraumatic Eyes.  Pupils round and reactive to light no discharge without nystagmus Neck.  Supple nontender no JVD without thyromegaly Cardiac regular rate rhythm not extra sounds or murmur heard Abdomen.  Soft nontender positive bowel sounds without rebound Respiratory effort normal no respiratory distress without wheeze Skin.  Surgical site dressed Neurologic.  Alert sitting up in bed makes eye contact with examiner.  Mood was flat but appropriate oriented to person and place.  Moving all extremities but only 4 -/5 strength in lower extremities  He/She  has had improvement in activity tolerance, balance, postural control as well as  ability to compensate for deficits. He/She has had improvement in functional use RUE/LUE  and RLE/LLE as well as improvement in awareness.  Ongoing family education.  Sessions focused on transfer training to ensure safe mobility at home.  Patient propelled wheelchair bilateral upper extremities throughout sessions modified independent.  Demonstrated slide board transfers with squat pivot to and from mat table supervision for slide board transfers required assist to place board.  Squat pivot with mod assist +2 for safety.  Perform car transfers x2 with slide board and moderate assist for board placement lower extremity management and scooting back into his seat.  Oral hygiene and shaving tasks seated in wheelchair.  At bed level simulated dressing tasks to demonstrate for wife threading feet and patient rolling with supervision to demonstrate how to don over hips.  Wife able to demonstrate donning doffing TLSO back brace.  Full family teaching completed plan discharged to home       Disposition: Discharge to home   Diet: Regular  Special Instructions: No driving smoking or alcohol  Medications at discharge. 1.  Tylenol as needed 2.  Norvasc 10 mg p.o. daily 3.  Dulcolax suppository daily as needed 4.  Coreg 3.125 mg p.o. twice daily 5.  Vitamin D 1000 units p.o. daily 6.  Colchicine 0.6 mg p.o. daily 7.  Voltaren 75 mg p.o. twice daily 8.  Colace 100 mg p.o. twice daily 9.  Cymbalta 60 mg p.o. nightly 10.  Amitiza 24 mcg p.o. twice daily 11.  Robaxin-750 milligram p.o. every 12 hours as needed muscle spasms 12.  MS Contin 15 mg p.o. every 12 hours 13.  Multivitamin daily 14.  Oxycodone 5 mg 4 times daily  as needed 15.  Protonix 40 mg p.o. nightly 16.  MiraLAX twice daily hold for loose stool 17.  Senokot S2 tabs p.o. twice daily 18.  Zanaflex 2 mg p.o. 3 times daily 19.  Lovenox 40 mg daily until 07/01/2021 and stop  30-35 minutes were spent completing discharge summary and discharge  planning  Discharge Instructions     Ambulatory referral to Physical Medicine Rehab   Complete by: As directed    Moderate complexity follow-up 1 to 2 weeks epidural abscess        Follow-up Information     Lovorn, Jinny Blossom, MD Follow up.   Specialty: Physical Medicine and Rehabilitation Why: Office to call for appointment Contact information: [id number] [place] [address] [phone]         Consuella Lose, MD Follow up.   Specialty: Neurosurgery Why: Call for appointment Contact information: [address] [place] [address] [phone]         Jabier Mutton, MD Follow up on 05/12/2021.   Specialty: Infectious Diseases Why: 9:45 am appointment - please arrive 15 minutes early to register/check in Contact information: [address] [place] [address] [place] [address] [phone]                 Signed: Lavon Paganini [place] 05/08/2021, 5:18 AM

## 2021-05-06 NOTE — Progress Notes (Signed)
Occupational Therapy Session Note  Patient Details  Name: Marco Kennedy MRN: [id number] Date of Birth: 1950-09-17  Today's Date: 05/06/2021 OT Individual Time: 1030-1058 and 1510-1600 OT Individual Time Calculation (min): 28 min and 50 min Missed 10 mins 2/2 previous pt care   Short Term Goals: Week 3:  OT Short Term Goal 1 (Week 3): Pt will perform squat pivot transfers to BSC/toilet with Mod A x2 OT Short Term Goal 1 - Progress (Week 3): Not met OT Short Term Goal 2 (Week 3): Pt will perform LB dress Min A with AE PRN bed level OT Short Term Goal 2 - Progress (Week 3): Progressing toward goal OT Short Term Goal 3 (Week 3): pt will don TLSO Min A at EOB OT Short Term Goal 3 - Progress (Week 3): Met  Skilled Therapeutic Interventions/Progress Updates:    Session 1: Pt greeted at time of session up in wheelchair agreeable to OT session and no pain, requesting to do wash up at sink level. Pt self propel to sink and performed UB bathing with Set up excluding reaching his back. Self propel throughout room as well to reach shirt that wife had laid out day prior. Donned shirt Set up and TLSO with Min A. Remainder of session focused on 1x5 wheelchair pushups for UB strengthening with good buttocks clearance from seat. Pt up in chair all needs met and call bell In reach.   Session 2: Pt greeted at time of session 10 minutes late 2/2 previous pt care running over from previous session. Pt up in wheelchair, agreeable to OT session, no pain. Self propel room <> main gym with Mod I. Slide board w/c <> mat with CGA and 2nd helper present for safety. At [facility], focused on sit <> stands with assist of 1 helper and therapist providing knee block in case but not needed as knees did not buckle when coming up into standing. Sit <> stands with Mod A of 1, and once standing for approx 30 seconds and distracted pt did have 1 episode of B knee buckling and returning to mat surface. Second attempt able to control  descent back to mat. Focused on BLE NMR/ROM with cone taps for various colors, needing Mod A to fully flex hips and tap cones. Slide board > wheelchair same as above, set up in room call bell in reach all needs met.   Therapy Documentation Precautions:  Precautions Precautions: Fall, Back Precaution Booklet Issued: No Precaution Comments: reviewed back precautions Required Braces or Orthoses: Spinal Brace Spinal Brace: Thoracolumbosacral orthotic, Applied in sitting position Spinal Brace Comments: TLSO when OOB only Restrictions Weight Bearing Restrictions: No     Therapy/Group: Individual Therapy  Viona Gilmore 05/06/2021, 7:23 AM

## 2021-05-07 ENCOUNTER — Other Ambulatory Visit (HOSPITAL_COMMUNITY): Payer: Self-pay

## 2021-05-07 MED ORDER — AMLODIPINE BESYLATE 10 MG PO TABS
10.0000 mg | ORAL_TABLET | Freq: Every day | ORAL | 0 refills | Status: DC
  Filled 2021-05-07: qty 30, 30d supply, fill #0

## 2021-05-07 MED ORDER — METHOCARBAMOL 750 MG PO TABS
750.0000 mg | ORAL_TABLET | Freq: Two times a day (BID) | ORAL | 0 refills | Status: DC | PRN
  Filled 2021-05-07: qty 60, 30d supply, fill #0

## 2021-05-07 MED ORDER — AMOXICILLIN 500 MG PO CAPS
500.0000 mg | ORAL_CAPSULE | Freq: Three times a day (TID) | ORAL | 0 refills | Status: DC
  Filled 2021-05-07: qty 42, 14d supply, fill #0

## 2021-05-07 MED ORDER — DULOXETINE HCL 60 MG PO CPEP
60.0000 mg | ORAL_CAPSULE | Freq: Every day | ORAL | 0 refills | Status: DC
  Filled 2021-05-07: qty 30, 30d supply, fill #0

## 2021-05-07 MED ORDER — OXYCODONE HCL 5 MG PO TABS
5.0000 mg | ORAL_TABLET | Freq: Four times a day (QID) | ORAL | 0 refills | Status: DC | PRN
  Filled 2021-05-07: qty 30, 7d supply, fill #0

## 2021-05-07 MED ORDER — LUBIPROSTONE 24 MCG PO CAPS
24.0000 ug | ORAL_CAPSULE | Freq: Two times a day (BID) | ORAL | 0 refills | Status: DC
  Filled 2021-05-07: qty 60, 30d supply, fill #0

## 2021-05-07 MED ORDER — ENOXAPARIN (LOVENOX) PATIENT EDUCATION KIT
PACK | Freq: Once | Status: DC
  Filled 2021-05-07 (×2): qty 1

## 2021-05-07 MED ORDER — TIZANIDINE HCL 2 MG PO TABS
2.0000 mg | ORAL_TABLET | Freq: Three times a day (TID) | ORAL | 0 refills | Status: DC
  Filled 2021-05-07: qty 90, 30d supply, fill #0

## 2021-05-07 MED ORDER — MORPHINE SULFATE ER 15 MG PO TBCR
15.0000 mg | EXTENDED_RELEASE_TABLET | Freq: Two times a day (BID) | ORAL | 0 refills | Status: DC
  Filled 2021-05-07: qty 14, 7d supply, fill #0

## 2021-05-07 MED ORDER — COLCHICINE 0.6 MG PO TABS
0.6000 mg | ORAL_TABLET | Freq: Every day | ORAL | 0 refills | Status: DC
  Filled 2021-05-07: qty 30, 30d supply, fill #0

## 2021-05-07 MED ORDER — ENOXAPARIN SODIUM 40 MG/0.4ML IJ SOSY
PREFILLED_SYRINGE | INTRAMUSCULAR | 1 refills | Status: DC
  Filled 2021-05-07: qty 22, 55d supply, fill #0

## 2021-05-07 MED ORDER — PANTOPRAZOLE SODIUM 40 MG PO TBEC
40.0000 mg | DELAYED_RELEASE_TABLET | Freq: Every day | ORAL | 0 refills | Status: DC
  Filled 2021-05-07: qty 30, 30d supply, fill #0

## 2021-05-07 MED ORDER — POLYETHYLENE GLYCOL 3350 17 G PO PACK: 17.0000 g | PACK | Freq: Two times a day (BID) | ORAL | 0 refills | Status: DC

## 2021-05-07 MED ORDER — CHOLECALCIFEROL 25 MCG (1000 UT) PO TABS
1000.0000 [IU] | ORAL_TABLET | Freq: Every day | ORAL | 0 refills | Status: DC
  Filled 2021-05-07: qty 30, 30d supply, fill #0

## 2021-05-07 MED ORDER — CARVEDILOL 3.125 MG PO TABS
3.1250 mg | ORAL_TABLET | Freq: Two times a day (BID) | ORAL | 0 refills | Status: DC
  Filled 2021-05-07: qty 60, 30d supply, fill #0

## 2021-05-07 MED ORDER — DICLOFENAC SODIUM 75 MG PO TBEC
75.0000 mg | DELAYED_RELEASE_TABLET | Freq: Two times a day (BID) | ORAL | 0 refills | Status: DC
  Filled 2021-05-07: qty 60, 30d supply, fill #0

## 2021-05-07 NOTE — Progress Notes (Signed)
Physical Therapy Session Note  Patient Details  Name: Marco Kennedy MRN: [id number] Date of Birth: July 15, 1950  Today's Date: 05/07/2021 PT Individual Time: 1010-1100, 1421-1530 PT Individual Time Calculation (min): 50 min, 69 min  Short Term Goals: Week 1:  PT Short Term Goal 1 (Week 1): Pt will tolerate sitting upright x 5 min with no more than 5/10 pain PT Short Term Goal 1 - Progress (Week 1): Met PT Short Term Goal 2 (Week 1): Pt will initiate w/c mobility PT Short Term Goal 2 - Progress (Week 1): Met PT Short Term Goal 3 (Week 1): Pt will perform bed mobility with mod A consistently adhering to back precautions PT Short Term Goal 3 - Progress (Week 1): Met Week 2:  PT Short Term Goal 1 (Week 2): Pt will perform stand pivot transfer with RW PT Short Term Goal 1 - Progress (Week 2): Progressing toward goal PT Short Term Goal 2 (Week 2): Pt will perform STS with min A PT Short Term Goal 2 - Progress (Week 2): Progressing toward goal PT Short Term Goal 3 (Week 2): Pt will initiate gait training PT Short Term Goal 3 - Progress (Week 2): Not met Week 3:  PT Short Term Goal 1 (Week 3): Pt will perform stand pivot transfer PT Short Term Goal 1 - Progress (Week 3): Met PT Short Term Goal 2 (Week 3): pt will perform car transfer with any level of assist PT Short Term Goal 2 - Progress (Week 3): Progressing toward goal PT Short Term Goal 3 (Week 3): Pt will perform STS with min A PT Short Term Goal 3 - Progress (Week 3): Met Week 4:  PT Short Term Goal 1 (Week 4): =LTGs d/t ELOS  Skilled Therapeutic Interventions/Progress Updates:    Session 1: Pt seated in w/c on arrival and agreeable to therapy. Pt premedicated for pain, no report of incr pain with activity that required intervention. Pt propelled w/c with BUE 2 x 300' ft, mod I. Pt then directed car transfer with occ VC for technique, min A to place board and complete scoot into car. Pt then navigated ramp with supervision.  Transitioned to mat table and performed Stand pivot transfer x 2 with RW and min A x 2 for safety. Pt performed Sit to stand with as little as CGA x 2 and as much as min A. Squats 3 x 8 for improved LE strength and endurance. Squat pivot with min A back to w/c. Pt returned to room and remained in w/c with all needs in reach.    Session 2:  Pt seated in w/c on arrival and agreeable to therapy. Pt premedicated for pain, no report of incr pain with activity that required intervention. Pt propelled w/c with BUE to/from dayroom for endurance. Pt performed Stand pivot transfer with mod A for balance and sequencing. Pt then performed side stepping the  length of mat table x 4 BIL with 4# ankle weights for improved proprioception, with seated rest breaks when needed. Occ knee buckling with mod A to recover. Sit to stand min-mod A d/t fatigue. Pt then performed 2 x 5 step taps BIL with guarding at knees and RW. Pt was unable to lift LLE onto 3" step and required assist for RLE. During rest breaks, discussed d/c planning and assessed pain interference. Performed strength and sensation testing seated EOM. CGA squat pivot transfer to w/c. Pt returned to room and remained in w/c with all needs in reach.   Therapy Documentation  Precautions:  Precautions Precautions: Fall, Back Precaution Booklet Issued: No Precaution Comments: recalled 3/3 back precautions without cue Required Braces or Orthoses: Spinal Brace Spinal Brace: Thoracolumbosacral orthotic, Applied in sitting position Spinal Brace Comments: TLSO when OOB only Restrictions Weight Bearing Restrictions: No General: PT Amount of Missed Time (min): 10 Minutes PT Missed Treatment Reason: Other (Comment) (previous pt care)    Therapy/Group: Individual Therapy  Mickel Fuchs 05/07/2021, 4:01 PM

## 2021-05-07 NOTE — Progress Notes (Signed)
Pt demonstrated lovenox administration with just a little reassurance.

## 2021-05-07 NOTE — Clinical Note (Incomplete)
Educational teaching provided to patient with patient preforming and demonstrating administration of medication, also video shown to patient

## 2021-05-07 NOTE — Progress Notes (Signed)
[name], male   DOB: 1951-01-14, 70 y.o.   MRN: [id number] Spoke with [facility] who requested information and confirmation discharge tomorrow. Have faxed information to her. Pt has all equipment to go home tomorrow. Still waiting on AFO's from [employer]

## 2021-05-07 NOTE — Progress Notes (Signed)
Occupational Therapy Discharge Summary  Patient Details  Name: Marco Kennedy MRN: [id number] Date of Birth: 15-Sep-1950    Patient has met 5 of 8 long term goals due to improved activity tolerance, improved balance, postural control, ability to compensate for deficits, improved awareness, and improved coordination.  Patient to discharge at [facility] Assist level.  Patient's care partner is independent to provide the necessary physical assistance at discharge.  Pt is CGA with slide board transfers to various surfaces including bed, wheelchair, and mat. Pt has made significantly progress toward OT goals during stay at [facility], performing sit <> stands at [place] now with Min A but continues to have difficulty sequencing and [place] for BLEs for stand pivot transfers and therefor has a stedy at home for wife to assist with toilet and shower transfers. Pt is Min A for shower level bathing using LHS and Mod A for LB dressing bed level using reacher to thread and rolling L/R for clothing management. Pt is total/Max for toileting tasks needing assist with clothing and posterior hygiene, however Set up with urinal. Pt to DC home with wife to assist as needed with ADL tasks. Wife [name] completed training on 11/8.  Reasons goals not met: Pt needs Max A for toileting, and stedy transfers to/from toilet and shower.   Recommendation:  Patient will benefit from ongoing skilled OT services in home health setting to continue to advance functional skills in the area of BADL, iADL, and Reduce care partner burden.  Equipment: No equipment provided Pt has BSC and TTB from previous admissions, pt also purchased Stedy for home  Reasons for discharge: treatment goals met and discharge from hospital  Patient/family agrees with progress made and goals achieved: Yes  OT Discharge Precautions/Restrictions  Precautions Precautions: Fall;Back Precaution Comments: recalled 3/3 back precautions without cue Required Braces or  Orthoses: Spinal Brace Spinal Brace: Thoracolumbosacral orthotic;Applied in sitting position Spinal Brace Comments: TLSO when OOB only Restrictions Weight Bearing Restrictions: No Vital Signs Therapy Vitals Temp: 98.4 F (36.9 C) Pulse Rate: 79 Resp: 16 BP: (!) 110/54 Patient Position (if appropriate): Sitting Oxygen Therapy SpO2: 97 % O2 Device: Room Air Pain Pain Assessment Pain Scale: 0-10 Pain Score: 0-No pain ADL ADL Equipment Provided: Reacher Eating: Independent Grooming: Modified independent Where Assessed-Grooming: Edge of bed (sitting EOB with Min to Mod A to maintain static sitting balance) Upper Body Bathing: Setup Where Assessed-Upper Body Bathing: Bed level Lower Body Bathing: Minimal assistance Where Assessed-Lower Body Bathing: Shower Upper Body Dressing: Minimal assistance Where Assessed-Upper Body Dressing: Edge of bed Lower Body Dressing: Moderate assistance Where Assessed-Lower Body Dressing: Bed level Toileting: Maximal assistance Where Assessed-Toileting: Other (Comment) (Urinal) Toilet Transfer: Dependent Charlaine Dalton) Toilet Transfer Method: Unable to assess Tub/Shower Transfer: Unable to assess Perception  Perception: Within Functional Limits Praxis Praxis: Intact Cognition Overall Cognitive Status: Within Functional Limits for tasks assessed Arousal/Alertness: Awake/alert Orientation Level: Oriented X4 Year: 2022 Month: November Day of Week: Correct Attention: Focused;Sustained Focused Attention: Appears intact Sustained Attention: Appears intact Memory: Appears intact Immediate Memory Recall: Sock;Blue;Bed Memory Recall Sock: Without Cue Memory Recall Blue: Without Cue Memory Recall Bed: Without Cue Awareness: Appears intact Problem Solving: Appears intact Safety/Judgment: Appears intact Sensation Sensation Light Touch: Impaired Detail Peripheral sensation comments: impaired distally>proximally Light Touch Impaired Details:  Impaired RLE;Impaired LLE Hot/Cold: Appears Intact Proprioception: Impaired Detail Proprioception Impaired Details: Impaired RLE;Impaired LLE Stereognosis: Appears Intact Coordination Gross Motor Movements are Fluid and Coordinated: No Fine Motor Movements are Fluid and Coordinated: Yes Coordination and  Movement Description: impaired 2/2 proprioception and LE weakness Motor  Motor Motor: Paraplegia;Abnormal tone;Abnormal postural alignment and control Motor - Skilled Clinical Observations: impaired 2/2 pain and BLE weakness Motor - Discharge Observations: Globally limited by endurance, abdominal distention, and arthritic knees, greatly improved from baseline Mobility  Bed Mobility Bed Mobility: Rolling Right;Rolling Left;Supine to Sit;Sit to Supine Rolling Right: Independent with assistive device Rolling Left: Independent with assistive device Right Sidelying to Sit: Supervision/Verbal cueing Supine to Sit: Supervision/Verbal cueing Sitting - Scoot to Edge of Bed: Independent with assistive device Sit to Supine: Moderate Assistance - Patient 50-74% Transfers Sit to Stand: Minimal Assistance - Patient > 75% Stand to Sit: Minimal Assistance - Patient > 75%  Trunk/Postural Assessment  Cervical Assessment Cervical Assessment: Within Functional Limits Thoracic Assessment Thoracic Assessment: Exceptions to [facility] Lumbar Assessment Lumbar Assessment: Exceptions to [facility] Postural Control Postural Control: Within Functional Limits Trunk Control: mod I sitting balance  Balance Balance Balance Assessed: Yes Static Sitting Balance Static Sitting - Balance Support: No upper extremity supported;Feet supported Static Sitting - Level of Assistance: 6: Modified independent (Device/Increase time) Dynamic Sitting Balance Dynamic Sitting - Balance Support: No upper extremity supported;Feet supported;During functional activity Dynamic Sitting - Level of Assistance: 5: Stand by assistance Dynamic  Sitting - Balance Activities: Lateral lean/weight shifting;Forward lean/weight shifting;Reaching for objects Static Standing Balance Static Standing - Balance Support: Bilateral upper extremity supported Static Standing - Level of Assistance: 4: Min assist Dynamic Standing Balance Dynamic Standing - Balance Support: During functional activity;Bilateral upper extremity supported Dynamic Standing - Level of Assistance: 3: Mod assist;4: Min assist Extremity/Trunk Assessment RUE Assessment RUE Assessment: Within Functional Limits LUE Assessment LUE Assessment: Within Functional Limits   Viona Gilmore 05/07/2021, 5:11 PM

## 2021-05-07 NOTE — Progress Notes (Signed)
Occupational Therapy Session Note  Patient Details  Name: Marco Kennedy MRN: [id number] Date of Birth: Nov 01, 1950  Today's Date: 05/07/2021 OT Individual Time: 1300-1355 OT Individual Time Calculation (min): 55 min    Short Term Goals: Week 3:  OT Short Term Goal 1 (Week 3): Pt will perform squat pivot transfers to BSC/toilet with Mod A x2 OT Short Term Goal 1 - Progress (Week 3): Not met OT Short Term Goal 2 (Week 3): Pt will perform LB dress Min A with AE PRN bed level OT Short Term Goal 2 - Progress (Week 3): Progressing toward goal OT Short Term Goal 3 (Week 3): pt will don TLSO Min A at EOB OT Short Term Goal 3 - Progress (Week 3): Met Week 4:  OT Short Term Goal 1 (Week 4): STGs = LTGs 2/2 LOS   Skilled Therapeutic Interventions/Progress Updates:    Pt greeted at time of session up in wheelchair agreeable to OT session, no pain at rest. Self propel room <> main gym with Mod I and BUE propulsion. Slide board w/c <> mat with CGA after slide board placement with good anterior weight shift and pushing through [place]. On EOM, focused on BUE there ex with 7# dowel in prep for HEP for 2x15-20 of the following: bicep curls, chest press, modified overhead press, FWD circles. 2x10 push ups on push up yoga blocks with good buttocks clearance to improve gross shoulder strength and improve form for sit <> stands. Slide board mat > w/c same manner as above and set up at [facility] on level 5 for 7 minutes for global endurance and UB strengthening. Back in room set up call bell in reach all needs met.    Therapy Documentation Precautions:  Precautions Precautions: Fall, Back Precaution Booklet Issued: No Precaution Comments: reviewed back precautions Required Braces or Orthoses: Spinal Brace Spinal Brace: Thoracolumbosacral orthotic, Applied in sitting position Spinal Brace Comments: TLSO when OOB only Restrictions Weight Bearing Restrictions: No     Therapy/Group: Individual  Therapy  Viona Gilmore 05/07/2021, 12:28 PM

## 2021-05-07 NOTE — Progress Notes (Signed)
Pt states he will self admin CPAP for bed tonight. RT will cont to monitor.

## 2021-05-07 NOTE — Progress Notes (Signed)
PROGRESS NOTE   Subjective/Complaints:  Pt reports wife meeting with inspector today to make sure everything done.  Concerned about rain tomorrow.  Slept on back, flat last night- helped with straightening hips/extension.   More shooting pain from feet- thinks it means is getting more sensation back.    ROS:   Pt denies SOB, abd pain, CP, N/V/C/D, and vision changes   Objective:   No results found. No results for input(s): WBC, HGB, HCT, PLT in the last 72 hours.    No results for input(s): NA, K, CL, CO2, GLUCOSE, BUN, CREATININE, CALCIUM in the last 72 hours.     Intake/Output Summary (Last 24 hours) at 05/07/2021 1010 Last data filed at 05/07/2021 0759 Gross per 24 hour  Intake 976 ml  Output 400 ml  Net 576 ml         Physical Exam: Vital Signs Blood pressure (!) 151/70, pulse 73, temperature 98.1 F (36.7 C), temperature source Oral, resp. rate 18, height 5\' 8"  (1.727 m), weight 112.1 kg, SpO2 100 %.       General: awake, alert, appropriate, sitting up in bed; NAD HENT: conjugate gaze; oropharynx moist CV: regular rate; no JVD Pulmonary: CTA B/L; no W/R/R- good air movement GI: soft, NT, ND, (+)BS Psychiatric: appropriate; flat but cordial Neurological: Ox3  MS: RLE_ HF 4-/5; KE 3-/5l DF 3+/5 PF 4+/5 LLE- HF 2+/5; KE 2/5; DF 2+/5 and PF 3+/5 Extremities- no edema and wearing PRAFOs B/L  Musc: Lower extremity edema.  No tenderness in extremities. Sensation to light touch is intact but proprioception is absent bilaterally  Assessment/Plan: 1. Functional deficits which require 3+ hours per day of interdisciplinary therapy in a comprehensive inpatient rehab setting. Physiatrist is providing close team supervision and 24 hour management of active medical problems listed below. Physiatrist and rehab team continue to assess barriers to discharge/monitor patient progress toward functional and  medical goals  Care Tool:  Bathing    Body parts bathed by patient: Right arm, Left arm, Chest, Abdomen, Face, Front perineal area, Right upper leg, Left upper leg, Right lower leg, Left lower leg (with LHS)   Body parts bathed by helper: Buttocks     Bathing assist Assist Level: Minimal Assistance - Patient > 75%     Upper Body Dressing/Undressing Upper body dressing   What is the patient wearing?: Pull over shirt, Orthosis    Upper body assist Assist Level: Minimal Assistance - Patient > 75%    Lower Body Dressing/Undressing Lower body dressing      What is the patient wearing?: Underwear/pull up, Pants     Lower body assist Assist for lower body dressing: Maximal Assistance - Patient 25 - 49%     Toileting Toileting    Toileting assist Assist for toileting: Dependent - Patient 0% Assistive Device Comment: stedy to elevated toilet   Transfers Chair/bed transfer  Transfers assist     Chair/bed transfer assist level: Minimal Assistance - Patient > 75%     Locomotion Ambulation   Ambulation assist   Ambulation activity did not occur: Safety/medical concerns  Assist level: 2 helpers Assistive device: Lite Gait Max distance: 120 ft   Walk  10 feet activity   Assist  Walk 10 feet activity did not occur: Safety/medical concerns  Assist level: 2 helpers Assistive device: Lite Gait   Walk 50 feet activity   Assist Walk 50 feet with 2 turns activity did not occur: Safety/medical concerns  Assist level: 2 helpers Assistive device: Lite Gait    Walk 150 feet activity   Assist Walk 150 feet activity did not occur: Safety/medical concerns         Walk 10 feet on uneven surface  activity   Assist Walk 10 feet on uneven surfaces activity did not occur: Safety/medical concerns         Wheelchair     Assist Is the patient using a wheelchair?: Yes Type of Wheelchair: Manual    Wheelchair assist level: Supervision/Verbal cueing Max  wheelchair distance: 200'    Wheelchair 50 feet with 2 turns activity    Assist        Assist Level: Supervision/Verbal cueing   Wheelchair 150 feet activity     Assist      Assist Level: Supervision/Verbal cueing   Blood pressure (!) 151/70, pulse 73, temperature 98.1 F (36.7 C), temperature source Oral, resp. rate 18, height 5\' 8"  (1.727 m), weight 112.1 kg, SpO2 100 %.  Medical Problem List and Plan: 1.  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 05/31/2021.  TLSO back brace when out of bed. JP drain removed 10/7.2022 11/10- con't PT and OT- d/c tomorrow- with therapy to get him in car to go home.  2.  Impaired mobility: continue lovenox.              -antiplatelet therapy: N/A 3. Postoperative spinal pain: Continue Voltaren 75 mg twice daily, Robaxin and oxycodone as needed. Added back dilaudid 1mg  q6H prn given severity of pain.   10/11-started ms contin 15 mg BID and stop Dliaudid and con't oxycodone- also add Cymbalta 30 mg QHS for nerve pain and mood.  10/20- increased Oxy to 10-15 mg q4 hours helped  Controlled with meds on 10/22 10/24- pt reports pain a little bette,r but still limiting- con't regimen for now  10/26- will change MS Contin to 6am and 6pm- see if that helps pain anymore for Am therapy esp  11/8- will send home on Oxycodone 5 mg tabs 5 mg QID prn and MS contin- I will wean after d/c- goal to not take long term  11/10- will get 7 days of meds initially, then he needs to call and I will give 30 days- goal to wean MS contin over that 30 days to 1x/day- will con't to wean after d/c- Oxyocodone- send home on 5 mg tabs- q6 hours  prn- since taking 10 mg BID right now.  4. Mood: Provide emotional support  10/10- pt appears depressed- will d/w pt in AM about starting something that could also help pain.   10/11-added cymbalta 30 mg QHS. 10/17-increased Cymbalta to 60 mg QHS 10/26- mood brighter- con't regimen             -antipsychotic agents: N/A 5. Neuropsych: This patient is capable of making decisions on his own behalf. 6. Skin/Wound Care: Routine skin checks 7. Fluids/Electrolytes/Nutrition: Routine in and outs 8.  ID.  Transitioned from  intravenous cefazolin to amoxicillin.    Check with ID when to stop Aomoxicllin- ~ 10/20- per chart-  call Dr Dorna Bloom or ID  10/31 wbc's 4.6  11/7- will call ID tomorrow about Amoxicillin and get stop date \ 11/9- are able to be stoppped- is done 9.  Acute blood loss anemia.  Latest hemoglobin 8.4.  Follow-up CBC Monday  Hemoglobin 9.3 on 10/17  11/3- Hb 10.1- con't to monitor 10.  Hyponatremia.  Sodium 136 on 10/10   10/31: 137 11.  Hypertension.   Continue Norvasc to 10 mg daily  11/3- will add Coreg- pulse can handle it- 3.125 mg BID for borderline high BP.    Vitals:   05/06/21 1939 05/07/21 0620  BP: 124/60 (!) 151/70  Pulse: 78 73  Resp: 16 18  Temp: 98.1 F (36.7 C) 98.1 F (36.7 C)  SpO2: 100% 100%  Controlled 11/6  11/7- BP controlled- con't regimen  11/8- pt wasn't on BP meds prior- explained PCP will need to wean IF POSSIBLE 11/10- BP overall doing much better- con't regimen   12.  Irritable bowel syndrome.  Amitiza 24 mcg twice daily  11/3- going regularly with meds- con't regimen  13.  History of gout.  Continue colchicine 14.  OSA.  CPAP  10/26- getting CPAP nightly- con't regimen 15.  Obesity.  Dietary follow-up 16.  Hypoalbuminemia  Supplement initiated on 10/15 17.  Drug-induced constipation  11/8- going regularly- plans on today.     LOS: 34 days A FACE TO FACE EVALUATION WAS PERFORMED  Aaryav Hopfensperger 05/07/2021, 10:10 AM

## 2021-05-07 NOTE — Progress Notes (Signed)
Physical Therapy Discharge Summary  Patient Details  Name: Marco Kennedy MRN: [id number] Date of Birth: [birth year]  Today's Date: 05/08/2021 PT Individual Time: 1122-1155 PT Individual Time Calculation (min): 33 min    Patient has met 5 of 5 long term goals due to improved activity tolerance, improved balance, improved postural control, increased strength, ability to compensate for deficits, and improved coordination.  Patient to discharge at a wheelchair level [place].   Patient's care partner is independent to provide the necessary physical assistance at discharge. Pt to d/c home with his wife, [name], who has undergone family education and provided appropriate levels of assist throughout. Pt performs slideboard transfer with supervision, squat pivot transfer with min A, and Stand pivot transfer with mod A. Car transfer with min A for board placement and LE management. Pt is mod I with w/c mobility up to 300 ft.   Reasons goals not met: NA  Recommendation:  Patient will benefit from ongoing skilled PT services in home health setting to continue to advance safe functional mobility, address ongoing impairments in LE strength, proprioception, endurance, and minimize fall risk.  Equipment: No equipment provided  Reasons for discharge: treatment goals met and discharge from hospital  Patient/family agrees with progress made and goals achieved: Yes  Skilled Therapeutic Interventions/Progress Updates:   Pt seated in w/c on arrival and agreeable to therapy. Pt premedicated for pain, no report of incr pain with activity that required intervention. Pt propelled w/c with BUE to front entrance for d/c. Pt then performed car transfer in/out/in with his wife, [name] providing min A. Therapist provided occ cueing for both for placement and technique. Both pt and [name] expressed improved confidence with practice. Pt then departed and nursing was made aware to complete d/c.   PT  Discharge Precautions/Restrictions Restrictions Weight Bearing Restrictions: No Vital Signs Therapy Vitals Temp: 98.1 F (36.7 C) Temp Source: Oral Pulse Rate: 73 Resp: 18 BP: (!) 151/70 Patient Position (if appropriate): Lying Oxygen Therapy SpO2: 100 % O2 Device: Room Air Pain Pain Assessment Pain Scale: 0-10 Pain Interference   Vision/Perception     Cognition Orientation Level: Oriented X4 Sensation   Motor     Mobility   Locomotion     Trunk/Postural Assessment     Balance   Extremity Assessment             Aracelia Brinson C Leighton Brickley 05/07/2021, 7:50 AM

## 2021-05-08 ENCOUNTER — Other Ambulatory Visit (HOSPITAL_COMMUNITY): Payer: Self-pay

## 2021-05-08 NOTE — Progress Notes (Signed)
PROGRESS NOTE   Subjective/Complaints:  Pt reports he's ready for d/c today.  Hasn't gotten the AFOs, but  talked with PT_ will call [employer] to get them delivered.   Explained to not use AFOs until H/H thinks safe to use.    ROS:   Pt denies SOB, abd pain, CP, N/V/C/D, and vision changes    Objective:   No results found. No results for input(s): WBC, HGB, HCT, PLT in the last 72 hours.    No results for input(s): NA, K, CL, CO2, GLUCOSE, BUN, CREATININE, CALCIUM in the last 72 hours.     Intake/Output Summary (Last 24 hours) at 05/08/2021 0840 Last data filed at 05/08/2021 0106 Gross per 24 hour  Intake --  Output 700 ml  Net -700 ml         Physical Exam: Vital Signs Blood pressure (!) 154/82, pulse 74, temperature (!) 97.5 F (36.4 C), temperature source Oral, resp. rate 16, height 5\' 8"  (1.727 m), weight 112.1 kg, SpO2 98 %.        General: awake, alert, appropriate, sitting up in bed; NAD HENT: conjugate gaze; oropharynx moist CV: regular rate; no JVD Pulmonary: CTA B/L; no W/R/R- good air movement GI: soft, NT, ND, (+)BS Psychiatric: appropriate; interactive Neurological: Ox3 MS: RLE_ HF 4-/5; KE 3-/5l DF 3+/5 PF 4+/5 LLE- HF 2+/5; KE 2/5; DF 2+/5 and PF 3+/5 Extremities- no edema and wearing PRAFOs B/L  Musc: Lower extremity edema.  No tenderness in extremities. Sensation to light touch is intact but proprioception is absent bilaterally  Assessment/Plan: 1. Functional deficits which require 3+ hours per day of interdisciplinary therapy in a comprehensive inpatient rehab setting. Physiatrist is providing close team supervision and 24 hour management of active medical problems listed below. Physiatrist and rehab team continue to assess barriers to discharge/monitor patient progress toward functional and medical goals  Care Tool:  Bathing    Body parts bathed by patient: Right arm, Left  arm, Chest, Abdomen, Face, Front perineal area, Right upper leg, Left upper leg, Right lower leg, Left lower leg   Body parts bathed by helper: Buttocks     Bathing assist Assist Level: Minimal Assistance - Patient > 75% (with LHS)     Upper Body Dressing/Undressing Upper body dressing   What is the patient wearing?: Pull over shirt, Orthosis    Upper body assist Assist Level: Minimal Assistance - Patient > 75%    Lower Body Dressing/Undressing Lower body dressing      What is the patient wearing?: Underwear/pull up, Pants     Lower body assist Assist for lower body dressing: Moderate Assistance - Patient 50 - 74% (with reacher, bed levle)     Toileting Toileting    Toileting assist Assist for toileting: Total Assistance - Patient < 25% Assistive Device Comment: stedy to elevated toilet   Transfers Chair/bed transfer  Transfers assist     Chair/bed transfer assist level: Supervision/Verbal cueing     Locomotion Ambulation   Ambulation assist   Ambulation activity did not occur: Safety/medical concerns  Assist level: 2 helpers Assistive device: Lite Gait Max distance: 120 ft   Walk 10 feet activity  Assist  Walk 10 feet activity did not occur: Safety/medical concerns  Assist level: 2 helpers Assistive device: Lite Gait   Walk 50 feet activity   Assist Walk 50 feet with 2 turns activity did not occur: Safety/medical concerns  Assist level: 2 helpers Assistive device: Lite Gait    Walk 150 feet activity   Assist Walk 150 feet activity did not occur: Safety/medical concerns         Walk 10 feet on uneven surface  activity   Assist Walk 10 feet on uneven surfaces activity did not occur: Safety/medical concerns         Wheelchair     Assist Is the patient using a wheelchair?: Yes Type of Wheelchair: Manual    Wheelchair assist level: Independent Max wheelchair distance: 300'    Wheelchair 50 feet with 2 turns  activity    Assist        Assist Level: Independent   Wheelchair 150 feet activity     Assist      Assist Level: Independent   Blood pressure (!) 154/82, pulse 74, temperature (!) 97.5 F (36.4 C), temperature source Oral, resp. rate 16, height 5\' 8"  (1.727 m), weight 112.1 kg, SpO2 98 %.  Medical Problem List and Plan: 1.  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 05/31/2021.  TLSO back brace when out of bed. JP drain removed 10/7.2022  11/11- d/c today- will need therapy to get him into car 2.  Impaired mobility: continue lovenox.              -antiplatelet therapy: N/A 3. Postoperative spinal pain: Continue Voltaren 75 mg twice daily, Robaxin and oxycodone as needed. Added back dilaudid 1mg  q6H prn given severity of pain.   10/11-started ms contin 15 mg BID and stop Dliaudid and con't oxycodone- also add Cymbalta 30 mg QHS for nerve pain and mood.  10/20- increased Oxy to 10-15 mg q4 hours helped  Controlled with meds on 10/22 10/24- pt reports pain a little bette,r but still limiting- con't regimen for now  10/26- will change MS Contin to 6am and 6pm- see if that helps pain anymore for Am therapy esp  11/8- will send home on Oxycodone 5 mg tabs 5 mg QID prn and MS contin- I will wean after d/c- goal to not take long term  11/10- will get 7 days of meds initially, then he needs to call and I will give 30 days- goal to wean MS contin over that 30 days to 1x/day- will con't to wean after d/c- Oxyocodone- send home on 5 mg tabs- q6 hours prn- since taking 10 mg BID right now.  4. Mood: Provide emotional support  10/10- pt appears depressed- will d/w  pt in AM about starting something that could also help pain.   10/11-added cymbalta 30 mg QHS. 10/17-increased Cymbalta to 60 mg QHS 10/26- mood brighter- con't regimen             -antipsychotic agents: N/A 5. Neuropsych: This patient is capable of making decisions on his own behalf. 6. Skin/Wound Care: Routine skin checks 7. Fluids/Electrolytes/Nutrition: Routine in and outs 8.  ID.  Transitioned from intravenous cefazolin to amoxicillin.    Check with ID when to stop  Aomoxicllin- ~ 10/20- per chart-  call Dr Dorna Bloom or ID  10/31 wbc's 4.6  11/7- will call ID tomorrow about Amoxicillin and get stop date \ 11/9- are able to be stoppped- is done 9.  Acute blood loss anemia.  Latest hemoglobin 8.4.  Follow-up CBC Monday  Hemoglobin 9.3 on 10/17  11/3- Hb 10.1- con't to monitor 10.  Hyponatremia.  Sodium 136 on 10/10   10/31: 137 11.  Hypertension.   Continue Norvasc to 10 mg daily  11/3- will add Coreg- pulse can handle it- 3.125 mg BID for borderline high BP.    Vitals:   05/08/21 0603 05/08/21 0816  BP: (!) 156/82 (!) 154/82  Pulse: 77 74  Resp: 16   Temp: (!) 97.5 F (36.4 C)   SpO2: 98%   Controlled 11/6  11/7- BP controlled- con't regimen  11/8- pt wasn't on BP meds prior- explained PCP will need to wean IF POSSIBLE 11/10- BP overall doing much better- con't regimen   11/11- BP up again this AM- will NEED to go home on BP meds 12.  Irritable bowel syndrome.  Amitiza 24 mcg twice daily  11/3- going regularly with meds- con't regimen  13.  History of gout.  Continue colchicine 14.  OSA.  CPAP  10/26- getting CPAP nightly- con't regimen 15.  Obesity.  Dietary follow-up 16.  Hypoalbuminemia  Supplement initiated on 10/15 17.  Drug-induced constipation  11/8- going regularly- plans on today.  18/ Dispo  11/11- d/c today- needs AFO s before d/c.    LOS: 35 days A FACE TO FACE EVALUATION WAS PERFORMED  Verita Kuroda 05/08/2021, 8:40 AM

## 2021-05-08 NOTE — Progress Notes (Signed)
Patient discharged to home, accompanied by his daughter. 

## 2021-05-11 ENCOUNTER — Telehealth: Payer: Self-pay

## 2021-05-11 ENCOUNTER — Other Ambulatory Visit: Payer: Self-pay

## 2021-05-11 DIAGNOSIS — G062 Extradural and subdural abscess, unspecified: Secondary | ICD-10-CM

## 2021-05-11 MED ORDER — CEFADROXIL 1 G PO TABS: 1.0000 g | ORAL_TABLET | Freq: Two times a day (BID) | ORAL | 0 refills | Status: DC

## 2021-05-11 NOTE — Telephone Encounter (Signed)
Patient called office stating he has not been on antibiotics since he discharged from ED. Spoke with Dr. Renold Don who provided orders for cefadroxil 1 gram po bid for 90 days.  Prescription sent to [facility] per patient's request.   Mr. Marco Kennedy will call office if he has any questions about the rx or if he has any issues getting medication filled. Juanita Laster, RMA

## 2021-05-12 ENCOUNTER — Other Ambulatory Visit (HOSPITAL_COMMUNITY): Payer: Self-pay

## 2021-05-12 ENCOUNTER — Inpatient Hospital Stay: Payer: BC Managed Care – PPO | Admitting: Internal Medicine

## 2021-05-13 ENCOUNTER — Other Ambulatory Visit (HOSPITAL_COMMUNITY): Payer: Self-pay

## 2021-05-19 ENCOUNTER — Other Ambulatory Visit (HOSPITAL_COMMUNITY): Payer: Self-pay

## 2021-05-19 ENCOUNTER — Telehealth (HOSPITAL_COMMUNITY): Payer: Self-pay | Admitting: Pharmacist

## 2021-05-19 NOTE — Telephone Encounter (Signed)
Pharmacy Transitions of Care Follow-up Telephone Call  Date of discharge: 05/07/2021   How have you been since you were released from the hospital? Good   Medication changes made at discharge:  - START: carvedilol; colchicine; DULoxetine; enoxaparin; morphine ; tiZANidine  - STOPPED: maalox, amoxil, zolpidem  - CHANGED: amlodipine, vit d, methocarbamol, oxycodone, miralax  Medication changes verified by the patient? Yes  (Yes/No)    Medication Review: ENOXAPARIN - Please verify dosing and anticipated length of therapy. Read discharge note to confirm date of completion and verify this with the patien  Follow-up Appointments:  PCP Hospital f/u appt confirmed? Yes   Specialist Hospital f/u appt confirmed? Yes   If their condition worsens, is the pt aware to call PCP or go to the Emergency Dept.? Yes  Final Patient Assessment: Meds reviewed

## 2021-05-20 ENCOUNTER — Encounter: Payer: BC Managed Care – PPO | Attending: Registered Nurse | Admitting: Physical Medicine and Rehabilitation

## 2021-05-20 ENCOUNTER — Encounter: Payer: Self-pay | Admitting: Physical Medicine and Rehabilitation

## 2021-05-20 ENCOUNTER — Other Ambulatory Visit: Payer: Self-pay

## 2021-05-20 VITALS — BP 118/78 | HR 89 | Temp 98.7°F | Ht 68.0 in

## 2021-05-20 DIAGNOSIS — G062 Extradural and subdural abscess, unspecified: Secondary | ICD-10-CM | POA: Insufficient documentation

## 2021-05-20 DIAGNOSIS — G8222 Paraplegia, incomplete: Secondary | ICD-10-CM | POA: Diagnosis present

## 2021-05-20 DIAGNOSIS — R600 Localized edema: Secondary | ICD-10-CM | POA: Diagnosis present

## 2021-05-20 MED ORDER — DULOXETINE HCL 60 MG PO CPEP: 60.0000 mg | ORAL_CAPSULE | Freq: Every day | ORAL | 5 refills | Status: DC

## 2021-05-20 NOTE — Patient Instructions (Signed)
Pt is a 70 yr old male with hx of epidural abscess with incomplete paraplegia status post T9-T10 laminectomy decompression followed by reconstruction T6-T10 posterior decompression fusion removal of hardware and extension of fusion to the thoracic 11 decompression of spinal cord direct T9-T11 laminectomy arthrodesis T6-T11 03/31/2021; also has post op and chronic pain; muscle spasms; and newly dx'd HTN; as well as OSA, gout; and IBS, with obesity;   Here for hospital f/u on SCI.   Weaning muscle relaxants- If having muscle spasms, take the Tizanidine first and if that isn't enough, then take the Methocarbamol- for tightness, cramping- if don't need, don't take   Nerve pain- Con't Cymbalta/Duloxetine- at current dose- sent in to pharmacy 60 mg nightly- #30 with 5 refills.   3. Keep Colchicine at home, but can stop taking daily- keep if starts having gout episode, then restart.    4. Keep Protonix for as needed.    5.  Off pain meds, so don't need UDS and opiate contract   6. Call me when H/H done so can transition to outpt PT and OT   7. F/U in 3 months- double visit SCI

## 2021-05-20 NOTE — Progress Notes (Signed)
Subjective:    [name], male    DOB: 1951/02/20, 70 y.o.   MRN: [id number]  HPI Pt is a 70 yr old male with hx of epidural abscess with incomplete paraplegia status post T9-T10 laminectomy decompression followed by reconstruction T6-T10 posterior decompression fusion removal of hardware and extension of fusion to the thoracic 11 decompression of spinal cord direct T9-T11 laminectomy arthrodesis T6-T11 03/31/2021; also has post op and chronic pain; muscle spasms; and newly dx'd HTN; as well as OSA, gout; and IBS, with obesity;   Here for hospital f/u on SCI.    Pain doing better-  Weaned off- did MS Contin x 1 week. And hasn't taken Oxy since left hospital.   Got the AFOs-  Before left hospital.   Has had PT 2x so far and OT 1x HH so far.   Starting to feel more in feet- stinging pain like feet asleep- and waking up- and legs are now doing it more now not just feet.    Is now on 2 BP meds.  Doesn't understand why.   Taking Zanaflex TID ; not been taking Robaxin- has been taking 2x/day.    Didn't get good check out- out of hospital.  ID  put back on Cefadroxil- for a total of 1 year from initial abscess.   House changes is working well-  Using steady to transfer in the house.  Working well.  Got new recliner lift chair to lay flat-  Legs are starting to swell in evenings- so needs to lay with legs elevated.      Pain Inventory Average Pain 2 Pain Right Now 1 My pain is constant, sharp, dull, and tingling  LOCATION OF PAIN  Feet, lower back  BOWEL Number of stools per week: 7 Taking Lubiprostone  BLADDER Normal   Mobility use a wheelchair needs help with transfers Do you have any goals in this area?  yes  Function employed # of hrs/week on medical leave in [employer] I need assistance with the following:  feeding, dressing, bathing, toileting, meal prep, household duties, and shopping Do you have any goals in this area?   yes  Neuro/Psych weakness numbness tremor tingling trouble walking spasms  Prior Studies Any changes since last visit?  no  Physicians involved in your care Any changes since last visit?  no   Family History  Problem Relation Age of Onset   Hypertension Mother    Arthritis Mother    Stomach cancer Father    Hypertension Brother    Social History   Socioeconomic History   Marital status: Married    Spouse name: Not on file   Number of children: 2   Years of education: Not on file   Highest education level: Not on file  Occupational History   Not on file  Tobacco Use   Smoking status: Never   Smokeless tobacco: Never  Vaping Use   Vaping Use: Never used  Substance and Sexual Activity   Alcohol use: Not Currently    Comment: rare- social    Drug use: No   Sexual activity: Yes  Other Topics Concern   Not on file  Social History Narrative   Not on file   Social Determinants of Health   Financial Resource Strain: Not on file  Food Insecurity: Not on file  Transportation Needs: Not on file  Physical Activity: Not on file  Stress: Not on file  Social Connections: Not on file   Past  Surgical History:  Procedure Laterality Date   ABDOMINAL EXPOSURE N/A 04/18/2020   Procedure: ABDOMINAL EXPOSURE;  Surgeon: [name], MD;  Location: [facility] OR;  Service: Vascular;  Laterality: N/A;   ANTERIOR LAT LUMBAR FUSION Left 04/18/2020   Procedure: ANTERIOR LATERAL LUMBAR FUSION LUMBAR TWO-THREE, LUMBAR THREE-FOUR;  Surgeon: [name], MD;  Location: [facility] OR;  Service: Neurosurgery;  Laterality: Left;   ANTERIOR LUMBAR FUSION N/A 04/18/2020   Procedure: Lumbar Four-Five Lumbar Five- Sacral One Anterior lumbar Interbody Fusion;  Surgeon: [name], MD;  Location: [facility] OR;  Service: Neurosurgery;  Laterality: N/A;   APPLICATION OF INTRAOPERATIVE CT SCAN N/A 04/21/2020   Procedure: APPLICATION OF INTRAOPERATIVE CT SCAN;  Surgeon: [name], MD;  Location: [facility]  OR;  Service: Neurosurgery;  Laterality: N/A;   BACK SURGERY     lumbar disc surgery   COLONOSCOPY W/ POLYPECTOMY     HERNIA REPAIR Left    left inguinal hernia repair   IR RADIOLOGIST EVAL & MGMT  02/24/2021   KNEE ARTHROSCOPY Right 2003    -scope for meniscus   LUMBAR WOUND DEBRIDEMENT N/A 01/13/2021   Procedure: THORACOLUMBAR WOUND DEBRIDEMENT;  Surgeon: [name], MD;  Location[facility] OR;  Service: Neurosurgery;  Laterality: N/A;   LUMBAR WOUND DEBRIDEMENT N/A 03/14/2021   Procedure: WOUND EXPLORATION AND DEBRIDEMENT OF ABSCESS;  Surgeon: Lisbeth Renshaw, MD;  Location: [facility] OR;  Service: Neurosurgery;  Laterality: N/A;   POSTERIOR LUMBAR FUSION 4 LEVEL N/A 04/21/2020   Procedure: Thoracic ten to pelvis pedicle screw fixation with osteotomies;  Surgeon: [name], MD;  Location: [facility] OR;  Service: Neurosurgery;  Laterality: N/A;   POSTERIOR LUMBAR FUSION 4 LEVEL N/A 03/31/2021   Procedure: Thoracic six to Thoracic nine  Posterior decompression/Fusion with removal hardware thoracic ten;  Surgeon: [name], MD;  Location: [facility] OR;  Service: Neurosurgery;  Laterality: N/A;   THORACIC LAMINECTOMY FOR EPIDURAL ABSCESS N/A 03/14/2021   Procedure: THORACIC LAMINECTOMY, THORACIC NINE-THORACIC TEN;  Surgeon: Lisbeth Renshaw, MD;  Location: [facility] OR;  Service: Neurosurgery;  Laterality: N/A;   TOTAL HIP ARTHROPLASTY Left 06/15/2016   Procedure: LEFT TOTAL HIP ARTHROPLASTY ANTERIOR APPROACH;  Surgeon: [name], MD;  Location: [facility] ORS;  Service: Orthopedics;  Laterality: Left;   Past Medical History:  Diagnosis Date   Arthritis    arthritis-bilateral knees, left Hip.   Degenerative lumbar spinal stenosis    EKG abnormality    06-08-16- being evaluated by cardiologist  in [place]   Idiopathic scoliosis of lumbar region    Joint stiffness    Knee pain    Leg swelling    Low back pain    Achy, shooting pain.  Hurts to Walk or stand up straight.   Lumbar radiculopathy    Scoliosis  concern    Sleep apnea    Bipap use nightly 25/19 -3l/m oxygen   Spondylolisthesis of lumbar region    Transfusion history    [place] ,[place] after bleeding post colon polyp removal and colonscopy procedure   There were no vitals taken for this visit.  Opioid Risk Score:   Fall Risk Score:  `1  Depression screen PHQ 2/9  Depression screen PHQ 2/9 02/26/2021  Decreased Interest 0  Down, Depressed, Hopeless 0  PHQ - 2 Score 0  Altered sleeping 1  Tired, decreased energy 1  Change in appetite 1  Feeling bad or failure about yourself  0  Trouble concentrating 0  Moving slowly or fidgety/restless 0  Suicidal thoughts 0  PHQ-9 Score 3    Review of Systems  Cardiovascular:  Positive for leg swelling.  Musculoskeletal:  Positive for back pain and gait problem.  Neurological:  Positive for tremors, weakness and numbness.  All other systems reviewed and are negative.     Objective:   Physical Exam Awake, alert, appropriate, in manual w/c; accompanied by wife, NAD Wearing TLSO  MS: RLE- HF 4-/5; KE 4/5; DF 0/5 and PF 3/5 LLE- HF 2/5, KE 4-/5 DF 2/5 and PF 3/5  Neuro: Decreased to light touch  B/L from T10 and downwards- but absent at S1 B/L  1+ patellar DTRs more on R than L   LE swelling 1-2+ B/L wearing TEDs to knees- to mid calves B/L     Assessment & Plan:    Pt is a 70 yr old male with hx of epidural abscess with incomplete paraplegia status post T9-T10 laminectomy decompression followed by reconstruction T6-T10 posterior decompression fusion removal of hardware and extension of fusion to the thoracic 11 decompression of spinal cord direct T9-T11 laminectomy arthrodesis T6-T11 03/31/2021; also has post op and chronic pain; muscle spasms; and newly dx'd HTN; as well as OSA, gout; and IBS, with obesity;   Here for hospital f/u on SCI.   Weaning muscle relaxants- If having muscle spasms, take the Tizanidine first and if that isn't enough, then take the Methocarbamol- for  tightness, cramping- if don't need, don't take   Nerve pain- Con't Cymbalta/Duloxetine- at current dose- sent in to pharmacy 60 mg nightly- #30 with 5 refills.   3. Keep Colchicine at home, but can stop taking daily- keep if starts having gout episode, then restart.    4. Keep Protonix for as needed.    5.  Off pain meds, so don't need UDS and opiate contract   6. Call me when H/H done so can transition to outpt PT and OT   7. F/U in 3 months- double visit SCI  8. Con't Lovenox until 07/01/21.   I spent a total of 32 minutes on visit- spent discussing med changes and how to wean meds.

## 2021-06-02 ENCOUNTER — Other Ambulatory Visit: Payer: Self-pay

## 2021-06-02 ENCOUNTER — Encounter: Payer: Self-pay | Admitting: Internal Medicine

## 2021-06-02 ENCOUNTER — Ambulatory Visit (INDEPENDENT_AMBULATORY_CARE_PROVIDER_SITE_OTHER): Payer: BC Managed Care – PPO | Admitting: Internal Medicine

## 2021-06-02 VITALS — BP 124/80 | HR 90 | Temp 97.4°F

## 2021-06-02 DIAGNOSIS — M4620 Osteomyelitis of vertebra, site unspecified: Secondary | ICD-10-CM | POA: Diagnosis not present

## 2021-06-02 DIAGNOSIS — T847XXD Infection and inflammatory reaction due to other internal orthopedic prosthetic devices, implants and grafts, subsequent encounter: Secondary | ICD-10-CM | POA: Diagnosis not present

## 2021-06-02 NOTE — Progress Notes (Signed)
[facility] for [facility]ious [facility]  Patient Active Problem List   Diagnosis Date Noted   Incomplete paraplegia ([place]) 05/20/2021   Bilateral leg edema 05/20/2021   Drug induced constipation    Pressure injury of skin 04/14/2021   Hypoalbuminemia due to protein-calorie malnutrition [facility])    Essential hypertension    Acute blood loss anemia    Labile blood pressure    Postoperative pain    Discitis of thoracic region    Epidural abscess 03/14/2021   Debility    Prediabetes    Constipation by delayed colonic transit    Constipation    Hyponatremia    Acute on chronic anemia    Retroperitoneal fluid collection    Pseudogout of knee, right 02/03/2021   Sepsis ([place]) 02/03/2021   Sepsis due to Escherichia coli (E. coli) ([place]) 02/01/2021   Empyema ([place])    Anasarca 01/20/2021   OSA (obstructive sleep apnea) 01/20/2021   Obesity, Class III, BMI 40-49.9 (morbid obesity) ([place]) 01/20/2021   Pleural effusion    Vertebral osteomyelitis ([place])    Hardware complicating wound infection ([place])    Abscess of back 01/11/2021   Lumbar scoliosis 04/18/2020   Back pain 12/25/2019   S/P left THA, AA 06/15/2016      Subjective:    [name], male    DOB: 07/04/1950, 70 y.o.   MRN: [id number]  CC: f/u hospital admission vertebral thoracic osteomyelitis/empyema/intra-peritoneal abscess  HPI:  Marco Kennedy is a 70 y.o. male prior extensive thoracolumbar hardware, complicated by thoracic vertebral om/epidural abscess here for hospital discharge f/u  Patient has had complex course with multiple admissions for the same since 12/2020. he had I&D 7/19 with cx showing ecoli, then discharged but readmitted 7/22 with dyspnea found to have empyema (chest tubes bilateral but cx negative -- lights exudative criteria met however). During the second admission also ct scan finding of left retroperitoneal fluid collection s/p IR drain placement 8/02, and right knee infusion  s/p aspiration 8/03 without evidence of infection.  Other previous w/u include a negative tte. And both admissions blood culture negative.  He continues on oral abx cephalexin and was most recently readmitted 9/17-10/07 and discharged to inpatient rehab until 11/11. He had worsening pain LE and mri repeat 9/16 showed worsening thoracic epidural collection. He underwent I&D on 9/17 with more hardware stabilization placed on 10/04. Operative culture was negative; bcx negative as well. He was discharged on amox-->cefadroxil. Of note he had repeat abd/pelv/chest ct as well which showed increased previous abd fluid collection -- pigtail catheter placed -- cx negative. All catheters removed prior to moving to inpatient rehab    --------------- 06/02/2021 id clinic f/u Not taking morphine since hospital discharge On cymbalta and taking tylenol Had sensation deficit prior to admission 02/2021 but that is now coming back Strength still very weak -- on wheel chair, but strength improving. Can't stand yet Patient has in house pt/ot No diarrhea/rash/n/v Wife reports some redness from being in wheel chair No chest pain/cough/sob No abd pain  No Known Allergies    Outpatient Medications Prior to Visit  Medication Sig Dispense Refill   amLODipine (NORVASC) 10 MG tablet Take 1 tablet (10 mg total) by mouth daily. 30 tablet 0   amlodipine-atorvastatin (CADUET) 10-10 MG tablet Take by mouth.     carvedilol (COREG) 3.125 MG tablet Take 1 tablet (3.125 mg total) by mouth 2 (two) times daily with a meal. 60 tablet  0   cefadroxil (DURICEF) 1 g tablet Take 1 tablet (1 g total) by mouth 2 (two) times daily. 180 tablet 0   cholecalciferol (VITAMIN D) 25 MCG (1000 UNIT) tablet Take by mouth.     colchicine 0.6 MG tablet Take 1 tablet (0.6 mg total) by mouth daily. 30 tablet 0   diclofenac (VOLTAREN) 75 MG EC tablet Take by mouth.     DULoxetine (CYMBALTA) 60 MG capsule Take 1 capsule (60 mg total) by mouth at  bedtime. 30 capsule 5   enoxaparin (LOVENOX) 40 MG/0.4ML injection Inject 1 syringe daily until 07/01/2021 and stop 22 mL 1   lubiprostone (AMITIZA) 24 MCG capsule Take 1 capsule (24 mcg total) by mouth 2 (two) times daily with a meal. 60 capsule 0   methocarbamol (ROBAXIN) 750 MG tablet Take 750 mg by mouth.     morphine (MS CONTIN) 15 MG 12 hr tablet Take 1 tablet (15 mg total) by mouth every 12 (twelve) hours. (Patient not taking: Reported on 05/20/2021) 14 tablet 0   oxyCODONE (OXY IR/ROXICODONE) 5 MG immediate release tablet Take 1 tablet (5 mg total) by mouth every 6 (six) hours as needed for moderate pain. (Patient not taking: Reported on 05/20/2021) 30 tablet 0   pantoprazole (PROTONIX) 40 MG tablet Take 1 tablet (40 mg total) by mouth at bedtime. 30 tablet 0   tiZANidine (ZANAFLEX) 2 MG tablet Take 1 tablet (2 mg total) by mouth 3 (three) times daily. 90 tablet 0   No facility-administered medications prior to visit.     Social History   Socioeconomic History   Marital status: Married    Spouse name: Not on file   Number of children: 2   Years of education: Not on file   Highest education level: Not on file  Occupational History   Not on file  Tobacco Use   Smoking status: Never   Smokeless tobacco: Never  Vaping Use   Vaping Use: Never used  Substance and Sexual Activity   Alcohol use: Not Currently    Comment: rare- social    Drug use: No   Sexual activity: Yes  Other Topics Concern   Not on file  Social History Narrative   Not on file   Social Determinants of Health   Financial Resource Strain: Not on file  Food Insecurity: Not on file  Transportation Needs: Not on file  Physical Activity: Not on file  Stress: Not on file  Social Connections: Not on file  Intimate Partner Violence: Not on file      Review of Systems    All other ros negative  Objective:    BP 124/80   Pulse 90   Temp (!) 97.4 F (36.3 C) (Oral)  Nursing note and vital signs  reviewed.  Physical Exam    General/constitutional: no distress, pleasant; in wheel chair HEENT: Normocephalic, PER, Conj Clear, EOMI, Oropharynx clear Neck supple CV: rrr no mrg Lungs: clear to auscultation, normal respiratory effort Abd: Soft, Nontender Ext: no edema Skin: No Rash Neuro: bilateral LE strength R>L overall around 4/5 MSK: per wife incision had all healed up no dehiscence. Wears back brace here; no tenderness    Labs: Lab Results  Component Value Date   WBC 4.6 04/27/2021   HGB 10.1 (L) 04/27/2021   HCT 32.3 (L) 04/27/2021   MCV 90.2 04/27/2021   PLT 323 00/86/7619   Last metabolic panel Lab Results  Component Value Date   GLUCOSE 91 04/27/2021  NA 137 04/27/2021   K 4.4 04/27/2021   CL 100 04/27/2021   CO2 28 04/27/2021   BUN 19 04/27/2021   CREATININE 0.69 04/27/2021   GFRNONAA >60 04/27/2021   GFRAA >60 06/16/2016   CALCIUM 8.9 04/27/2021   PHOS 3.8 03/19/2021   PROT 5.7 (L) 04/06/2021   ALBUMIN 2.4 (L) 04/06/2021   BILITOT 0.5 04/06/2021   ALKPHOS 68 04/06/2021   AST 19 04/06/2021   ALT 9 04/06/2021   ANIONGAP 9 04/27/2021    Crp: 10/31   0.5 (<1) 10/06   8.1 (<1) 9/20   15.1 (<1) 8/19   10.5 (<1) 7/26   36    (<1)  Micro: 9/17 and 10/04 operative back wound cx negative 9/17 bcx negative 9/17 abd fluid collection cx negative  Serology:  Imaging: 8/20 abdpelv ct 1. Left pelvic drain in the left iliac fossa has effectively drained nearly all of the previously noted left-sided pelvic fluid collection, as discussed above. 2. Persistent bilateral pleural effusions. On the left, this is simple in appearance on today's examination. On the right, there is persistent loculation and persistent gas and pleural thickening/enhancement, indicative of residual empyema. 3. Aortic atherosclerosis. 4. Additional incidental findings, as above.  8/03 mri lumbar thoracic spine 1. Unchanged appearance of ventral epidural abscess extending  from T7-T11 with likely source at the T10-11 disc space. Circumferential dural thickening at the T10 level again effaces the thecal sac with mass effect on the spinal cord. 2. New abnormal disc signal at T9-10 and unchanged abnormal signal T10-11, consistent with discitis-osteomyelitis. 3. No lumbar spinal canal stenosis. 4. Loculated bilateral pleural collections.  9/24 mri thoracic/lumbar spine 1. Postoperative changes from interval decompressive laminectomy at T9 and T10. Satisfactory appearance of the laminectomy site, with improved stenosis at these levels. However, there is persistent severe spinal stenosis just below the operative site at the level of T10-11 with moderate cord flattening, slightly worsened as compared to 03/14/2021. No definite or convincing cord signal changes at this time. 2. 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 Associated ventral epidural abscess extending from T8 through T11 is overall slightly decreased in size as compared to previous. 3. Persistent paravertebral inflammation and phlegmon, with persistent irregular loculated bilateral pleural collections/empyema. 4. No other new or progressive findings elsewhere within the thoracolumbar spine.  9/24 abd pelv ct with contrast 1. Left pelvic collection has been decompressed with a percutaneous pigtail catheter. Adjacent inflammatory changes have decreased from the prior CT. 2. No new or acute abnormalities within the abdomen or pelvis. 3. Changes of discitis/osteomyelitis of the lower thoracic spine are similar to the prior CT.  Assessment & Plan:   Problem List Items Addressed This Visit       Musculoskeletal and Integument   Vertebral osteomyelitis ([place])     Other   Hardware complicating wound infection ([place]) - Primary     No orders of the defined types were placed in this encounter.    CC: Hardware associated thoracic vertebral om/epidural absess      Lines:  8/02-8/30 LLQ abd perc-drain via IR 7/21-c Right ue picc      Abx: 7/30-c cefazolin   7/26-30 cefepime 7/26-7/28 vanc Home iv cefazolin  Assessment/Plan 70 yo male previous thoracolumbar hardware/back surgery [id number] complicated by thoracic vertebral om/epidural abscess s/p recent I&D 7/19 with cx ecoli (bcx negative at that time), readmitted a few days after discharged on 7/22 for fluid overload and bilateral pleural effusion and fever, s/p bilateral chest tube placement with effusion appearing exudative. Course also with ct imaging showing left retroperitoneal fluid collection s/p IR drain placement 8/02 with negative cx, and right knee effusion s/p aspiration 8/3 without evidence of infection either.  He had a repeat admission 03/14/21 for worsening pain/numbness in legs with imaging showing worsening epidural process and abd fluid collection, while on abx. He underwent I&D 9/17 and more hardware for stabilization 10/04 -- operative cx negative. Drain in abd abscess placed 9/17 but since removed as repeat abd ct 9/27 showed abscess resolved -- cx negative as well       06/02/21 id assessment #vertebral OM/epidural abscess -- hardware related #retroperitoneal fluid collection #exudative bilateral pleural effusion/empyema Initial epidural abscess I&D 7/19; cx ecoli Initial pleural fluid cx negative 7/16 and 7/26 Repeat bcx this admission negative  Suspect continguous spinal spread to the pleural space S/p bilateral chest tubes which were removed by 7/30 7/27 tte no evidence endocarditis 9/17 repeat abscess I&D cx negative 9/17 abd fluid collection cx negative 10/04 surgical site cx negative   Doing better in terms of pain and neurological strength deficit All catheters out during recent admissions Still in wheel chair but working with pt/ot Tolerating cefadroxil -labs today -continue abx -f/u around 6-8  weeks -avoid pressure on buttock too long in wheel chair  -patient had previously planned knee replacement, but at current pace, we need to be sure at least 6 months that his back infection is well controlled and quiescent before even considering any other hardware placement.   Follow-up: Return in about 6 weeks (around 07/14/2021).    Jabier Mutton, [place] for [place] [phone]  pager   [phone] cell 06/02/2021, 10:16 AM

## 2021-06-02 NOTE — Patient Instructions (Addendum)
Continue cefadroxil 1000 mg twice a day.   Will get labs today  Follow up with me in 6-8 weeks   Continue physical therapy Please avoid staying in wheelchair more than 2-3 hours at a time

## 2021-06-03 LAB — COMPLETE METABOLIC PANEL WITH GFR
AG Ratio: 1.6 (calc) (ref 1.0–2.5)
ALT: 16 U/L (ref 9–46)
AST: 17 U/L (ref 10–35)
Albumin: 4.1 g/dL (ref 3.6–5.1)
Alkaline phosphatase (APISO): 72 U/L (ref 35–144)
BUN/Creatinine Ratio: 41 (calc) — ABNORMAL HIGH (ref 6–22)
BUN: 28 mg/dL — ABNORMAL HIGH (ref 7–25)
CO2: 33 mmol/L — ABNORMAL HIGH (ref 20–32)
Calcium: 9.5 mg/dL (ref 8.6–10.3)
Chloride: 102 mmol/L (ref 98–110)
Creat: 0.69 mg/dL — ABNORMAL LOW (ref 0.70–1.28)
Globulin: 2.6 g/dL (calc) (ref 1.9–3.7)
Glucose, Bld: 100 mg/dL — ABNORMAL HIGH (ref 65–99)
Potassium: 4.4 mmol/L (ref 3.5–5.3)
Sodium: 140 mmol/L (ref 135–146)
Total Bilirubin: 0.3 mg/dL (ref 0.2–1.2)
Total Protein: 6.7 g/dL (ref 6.1–8.1)
eGFR: 100 mL/min/{1.73_m2} (ref >=60)

## 2021-06-03 LAB — CBC
HCT: 40 % (ref 38.5–50.0)
Hemoglobin: 12.7 g/dL — ABNORMAL LOW (ref 13.2–17.1)
MCH: 27.8 pg (ref 27.0–33.0)
MCHC: 31.8 g/dL — ABNORMAL LOW (ref 32.0–36.0)
MCV: 87.5 fL (ref 80.0–100.0)
MPV: 9.8 fL (ref 7.5–12.5)
Platelets: 376 Thousand/uL (ref 140–400)
RBC: 4.57 Million/uL (ref 4.20–5.80)
RDW: 14.2 % (ref 11.0–15.0)
WBC: 8.8 Thousand/uL (ref 3.8–10.8)

## 2021-06-03 LAB — C-REACTIVE PROTEIN: CRP: 5.8 mg/L

## 2021-06-04 ENCOUNTER — Telehealth: Payer: Self-pay | Admitting: *Deleted

## 2021-06-04 MED ORDER — AMLODIPINE BESYLATE 10 MG PO TABS: 10.0000 mg | ORAL_TABLET | Freq: Every day | ORAL | 0 refills | Status: AC

## 2021-06-04 MED ORDER — LUBIPROSTONE 24 MCG PO CAPS: 24.0000 ug | ORAL_CAPSULE | Freq: Two times a day (BID) | ORAL | 0 refills | Status: DC

## 2021-06-04 MED ORDER — CARVEDILOL 3.125 MG PO TABS: 3.1250 mg | ORAL_TABLET | Freq: Two times a day (BID) | ORAL | 0 refills | Status: AC

## 2021-06-04 NOTE — Telephone Encounter (Signed)
Marco Kennedy called for refills on all of his medication. He does not see his PCP until 06/15/21 and needs refills now.  I will send in one month courtesy refills to his pharmacy.

## 2021-06-24 ENCOUNTER — Other Ambulatory Visit: Payer: Self-pay | Admitting: Physical Medicine and Rehabilitation

## 2021-07-28 ENCOUNTER — Ambulatory Visit: Payer: BC Managed Care – PPO | Admitting: Internal Medicine

## 2021-07-28 ENCOUNTER — Other Ambulatory Visit: Payer: Self-pay

## 2021-07-28 DIAGNOSIS — T847XXD Infection and inflammatory reaction due to other internal orthopedic prosthetic devices, implants and grafts, subsequent encounter: Secondary | ICD-10-CM | POA: Diagnosis not present

## 2021-07-28 DIAGNOSIS — M4620 Osteomyelitis of vertebra, site unspecified: Secondary | ICD-10-CM

## 2021-07-28 DIAGNOSIS — M4625 Osteomyelitis of vertebra, thoracolumbar region: Secondary | ICD-10-CM

## 2021-07-28 DIAGNOSIS — G062 Extradural and subdural abscess, unspecified: Secondary | ICD-10-CM | POA: Diagnosis not present

## 2021-07-28 MED ORDER — CEFADROXIL 500 MG PO CAPS: 1000.0000 mg | ORAL_CAPSULE | Freq: Two times a day (BID) | ORAL | 2 refills | Status: AC

## 2021-07-28 NOTE — Patient Instructions (Signed)
Please continue cefadroxil 1 gram twice a day for now   Please discuss with your neurosurgeon to get mri of your thoracic and lumbar spine again to make sure the abscesses had resolved    Once this information is ascertained and your labs look good.    Labs today  See me in 3 months

## 2021-07-28 NOTE — Progress Notes (Addendum)
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 coli) ([place]) 02/01/2021   Empyema ([place])    Anasarca 01/20/2021   OSA (obstructive sleep apnea) 01/20/2021   Obesity, Class III, BMI 40-49.9 (morbid obesity) ([place]) 01/20/2021   Pleural effusion    Vertebral osteomyelitis ([place])    Hardware complicating wound infection ([place])    Abscess of back 01/11/2021   Lumbar scoliosis 04/18/2020   Back pain 12/25/2019   S/P left THA, AA 06/15/2016      Subjective:    [name], male    DOB: [birth year], 71 y.o.   MRN: [id number]  CC: f/u vertebral thoracic osteomyelitis/empyema/intra-peritoneal abscess  HPI:  Marco Kennedy is a 71 y.o. male prior extensive thoracolumbar hardware, complicated by thoracic vertebral om/epidural abscess here for hospital discharge f/u  Patient has had complex course with multiple admissions for the same since 12/2020. he had I&D 7/19 with cx showing ecoli, then discharged but readmitted 7/22 with dyspnea found to have empyema (chest tubes bilateral but cx negative -- lights exudative criteria met however). During the second admission also ct scan finding of left retroperitoneal fluid collection s/p IR drain placement 8/02, and right knee infusion s/p aspiration 8/03  without evidence of infection.  Other previous w/u include a negative tte. And both admissions blood culture negative.  He continues on oral abx cephalexin and was most recently readmitted 9/17-10/07 and discharged to inpatient rehab until 11/11. He had worsening pain LE and mri repeat 9/16 showed worsening thoracic epidural collection. He underwent I&D on 9/17 with more hardware stabilization placed on 10/04. Operative culture was negative; bcx negative as well. He was discharged on amox-->cefadroxil. Of note he had repeat abd/pelv/chest ct as well which showed increased previous abd fluid collection -- pigtail catheter placed -- cx negative. All catheters removed prior to moving to inpatient rehab    --------------- 06/02/2021 id clinic f/u Not taking morphine since hospital discharge On cymbalta and taking tylenol Had sensation deficit prior to admission 02/2021 but that is now coming back Strength still very weak -- on wheel chair, but strength improving. Can't stand yet Patient has in house pt/ot No diarrhea/rash/n/v Wife reports some redness from being in wheel chair No chest pain/cough/sob No abd pain  07/28/21 id clinic f/u Doing pt/ot -- walking about 20 ft with fww and some assist No n/v/diarrhea/rash No fever/chill Pain well controlled -- cymbalta, voltaren. No narcotics products Wears back brace To see nsg again 08/2021 and will gradually be off the brace   No Known Allergies    Outpatient Medications Prior to Visit  Medication Sig Dispense Refill   amLODipine (NORVASC) 10 MG tablet Take 1 tablet (10  mg total) by mouth daily. 30 tablet 0   carvedilol (COREG) 3.125 MG tablet Take 1 tablet (3.125 mg total) by mouth 2 (two) times daily with a meal. 60 tablet 0   cefadroxil (DURICEF) 1 g tablet Take 1 tablet (1 g total) by mouth 2 (two) times daily. 180 tablet 0   cholecalciferol (VITAMIN D) 25 MCG (1000 UNIT) tablet Take by mouth.     diclofenac (VOLTAREN) 75 MG EC tablet  Take by mouth.     DULoxetine (CYMBALTA) 60 MG capsule TAKE 1 CAPSULE (60 MG TOTAL) BY MOUTH AT BEDTIME. 90 capsule 1   methocarbamol (ROBAXIN) 750 MG tablet Take 750 mg by mouth.     colchicine 0.6 MG tablet Take 1 tablet (0.6 mg total) by mouth daily. (Patient not taking: Reported on 06/02/2021) 30 tablet 0   enoxaparin (LOVENOX) 40 MG/0.4ML injection Inject 1 syringe daily until 07/01/2021 and stop 22 mL 1   lubiprostone (AMITIZA) 24 MCG capsule Take 1 capsule (24 mcg total) by mouth 2 (two) times daily with a meal. 60 capsule 0   morphine (MS CONTIN) 15 MG 12 hr tablet Take 1 tablet (15 mg total) by mouth every 12 (twelve) hours. (Patient not taking: Reported on 05/20/2021) 14 tablet 0   oxyCODONE (OXY IR/ROXICODONE) 5 MG immediate release tablet Take 1 tablet (5 mg total) by mouth every 6 (six) hours as needed for moderate pain. (Patient not taking: Reported on 05/20/2021) 30 tablet 0   pantoprazole (PROTONIX) 40 MG tablet Take 1 tablet (40 mg total) by mouth at bedtime. (Patient not taking: Reported on 06/02/2021) 30 tablet 0   tiZANidine (ZANAFLEX) 2 MG tablet Take 1 tablet (2 mg total) by mouth 3 (three) times daily. 90 tablet 0   No facility-administered medications prior to visit.     Social History   Socioeconomic History   Marital status: Married    Spouse name: Not on file   Number of children: 2   Years of education: Not on file   Highest education level: Not on file  Occupational History   Not on file  Tobacco Use   Smoking status: Never   Smokeless tobacco: Never  Vaping Use   Vaping Use: Never used  Substance and Sexual Activity   Alcohol use: Not Currently    Comment: rare- social    Drug use: No   Sexual activity: Yes  Other Topics Concern   Not on file  Social History Narrative   Not on file   Social Determinants of Health   Financial Resource Strain: Not on file  Food Insecurity: Not on file  Transportation Needs: Not on file  Physical Activity: Not on  file  Stress: Not on file  Social Connections: Not on file  Intimate Partner Violence: Not on file      Review of Systems    All other ros negative  Objective:    There were no vitals taken for this visit. Nursing note and vital signs reviewed.  Physical Exam    General/constitutional: no distress, pleasant HEENT: Normocephalic, PER, Conj Clear, EOMI, Oropharynx clear Neck supple CV: rrr no mrg Lungs: clear to auscultation, normal respiratory effort Abd: Soft, Nontender Ext: no edema Skin: No Rash  Neuro: bilateral LE strength R>L overall around 4/5 MSK: per wife incision had all healed up no dehiscence. Wears back brace here; no tenderness    Labs: Lab Results  Component Value Date   WBC 8.8 06/02/2021   HGB 12.7 (L) 06/02/2021  HCT 40.0 06/02/2021   MCV 87.5 06/02/2021   PLT 376 04/21/8526   Last metabolic panel Lab Results  Component Value Date   GLUCOSE 100 (H) 06/02/2021   NA 140 06/02/2021   K 4.4 06/02/2021   CL 102 06/02/2021   CO2 33 (H) 06/02/2021   BUN 28 (H) 06/02/2021   CREATININE 0.69 (L) 06/02/2021   GFRNONAA >60 04/27/2021   GFRAA >60 06/16/2016   CALCIUM 9.5 06/02/2021   PHOS 3.8 03/19/2021   PROT 6.7 06/02/2021   ALBUMIN 2.4 (L) 04/06/2021   BILITOT 0.3 06/02/2021   ALKPHOS 68 04/06/2021   AST 17 06/02/2021   ALT 16 06/02/2021   ANIONGAP 9 04/27/2021    Crp: 10/31   0.5 (<1) 10/06   8.1 (<1) 9/20   15.1 (<1) 8/19   10.5 (<1) 7/26   36    (<1)  Micro: 9/17 and 10/04 operative back wound cx negative 9/17 bcx negative 9/17 abd fluid collection cx negative  Serology:  Imaging: 8/20 abdpelv ct 1. Left pelvic drain in the left iliac fossa has effectively drained nearly all of the previously noted left-sided pelvic fluid collection, as discussed above. 2. Persistent bilateral pleural effusions. On the left, this is simple in appearance on today's examination. On the right, there is persistent loculation and persistent  gas and pleural thickening/enhancement, indicative of residual empyema. 3. Aortic atherosclerosis. 4. Additional incidental findings, as above.  8/03 mri lumbar thoracic spine 1. 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 Circumferential dural thickening at the T10 level again effaces the thecal sac with mass effect on the spinal cord. 2. New abnormal disc signal at T9-10 and unchanged abnormal signal T10-11, consistent with discitis-osteomyelitis. 3. No lumbar spinal canal stenosis. 4. Loculated bilateral pleural collections.  9/24 mri thoracic/lumbar spine 1. Postoperative changes from interval decompressive laminectomy at T9 and T10. Satisfactory appearance of the laminectomy site, with improved stenosis at these levels. However, there is persistent severe spinal stenosis just below the operative site at the level of T10-11 with moderate cord flattening, slightly worsened as compared to 03/14/2021. No definite or convincing cord signal changes at this time. 2. 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 Associated ventral epidural abscess extending from T8 through T11 is overall slightly decreased in size as compared to previous. 3. Persistent paravertebral inflammation and phlegmon, with persistent irregular loculated bilateral pleural collections/empyema. 4. No other new or progressive findings elsewhere within the thoracolumbar spine.  9/24 abd pelv ct with contrast 1. Left pelvic collection has been decompressed with a percutaneous pigtail catheter. Adjacent inflammatory changes have decreased from the prior CT. 2. No new or acute abnormalities within the abdomen or pelvis. 3. Changes of discitis/osteomyelitis of the lower thoracic spine are similar to the prior CT.  Assessment & Plan:   Problem List Items Addressed This Visit       Nervous and Auditory   Epidural  abscess   Relevant Orders   C-reactive protein   CBC   Comprehensive metabolic panel     Musculoskeletal and Integument   Vertebral osteomyelitis ([place]) - Primary   Relevant Orders   C-reactive protein   CBC   Comprehensive metabolic panel     Other   Hardware complicating wound infection ([place])   Relevant Orders   C-reactive protein   CBC   Comprehensive metabolic panel    No orders of the defined types were placed in this  encounter.    CC: Hardware associated thoracic vertebral om/epidural absess     Lines:  8/02-8/30 LLQ abd perc-drain via IR      Abx: 11/14-c cefadroxil  10/07-11/13 amoxicillin 7/30-10/07 cefazolin  7/26-30 cefepime 7/26-7/28 vanc Home iv cefazolin                                                          Assessment/Plan 71 yo male previous thoracolumbar hardware/back surgery [id number]/8502 complicated by thoracic vertebral om/epidural abscess s/p recent I&D 7/19 with cx ecoli (bcx negative at that time), readmitted a few days after discharged on 7/22 for fluid overload and bilateral pleural effusion and fever, s/p bilateral chest tube placement with effusion appearing exudative. Course also with ct imaging showing left retroperitoneal fluid collection s/p IR drain placement 8/02 with negative cx, and right knee effusion s/p aspiration 8/3 without evidence of infection either.  He had a repeat admission 03/14/21 for worsening pain/numbness in legs with imaging showing worsening epidural process and abd fluid collection, while on abx. He underwent I&D 9/17 and more hardware for stabilization 10/04 -- operative cx negative. Drain in abd abscess placed 9/17 but since removed as repeat abd ct 9/27 showed abscess resolved -- cx negative as well      #vertebral OM/epidural abscess -- hardware related #retroperitoneal fluid collection #exudative bilateral pleural effusion/empyema Initial epidural abscess I&D 7/19; cx ecoli Initial pleural fluid cx  negative 7/16 and 7/26 Repeat bcx this admission negative  Suspect continguous spinal spread to the pleural space S/p bilateral chest tubes which were removed by 7/30 7/27 tte no evidence endocarditis 9/17 repeat abscess I&D cx negative 9/17 abd fluid collection cx negative 10/04 surgical site cx negative    07/28/21 assessment patient had previously planned knee replacement, but at current pace, we need to be sure at least 6 months that his back infection is well controlled and quiescent before even considering any other hardware placement.  Would like to know if the abscess had resolved. With resolution and normalization of crp, would be comfortable to decrease the cefadroxil 500 twice a day  He'll see surgery in 08/2021 -- would like to see if mri thoracolumbar area can be done at this time  Would continue cefadroxil 1 gram po twice a day for now  Follow up 09/2021 (3 months)  He wants the bivalent covid vaccine today as well -- will give  Follow-up: Return in about 3 months (around 10/25/2021).    Jabier Mutton, [place] for [place] [phone]  pager   [phone] cell 07/28/2021, 10:15 AM

## 2021-07-29 LAB — CBC
HCT: 44.1 % (ref 38.5–50.0)
Hemoglobin: 14.3 g/dL (ref 13.2–17.1)
MCH: 28.5 pg (ref 27.0–33.0)
MCHC: 32.4 g/dL (ref 32.0–36.0)
MCV: 87.8 fL (ref 80.0–100.0)
MPV: 9.9 fL (ref 7.5–12.5)
Platelets: 304 10*3/uL (ref 140–400)
RBC: 5.02 Million/uL (ref 4.20–5.80)
RDW: 13.4 % (ref 11.0–15.0)
WBC: 6.8 10*3/uL (ref 3.8–10.8)

## 2021-07-29 LAB — COMPREHENSIVE METABOLIC PANEL
AG Ratio: 1.6 (calc) (ref 1.0–2.5)
ALT: 30 U/L (ref 9–46)
AST: 24 U/L (ref 10–35)
Albumin: 4.2 g/dL (ref 3.6–5.1)
Alkaline phosphatase (APISO): 75 U/L (ref 35–144)
BUN: 23 mg/dL (ref 7–25)
CO2: 30 mmol/L (ref 20–32)
Calcium: 9.5 mg/dL (ref 8.6–10.3)
Chloride: 102 mmol/L (ref 98–110)
Creat: 0.84 mg/dL (ref 0.70–1.28)
Globulin: 2.7 g/dL (calc) (ref 1.9–3.7)
Glucose, Bld: 176 mg/dL — ABNORMAL HIGH (ref 65–99)
Potassium: 4.1 mmol/L (ref 3.5–5.3)
Sodium: 140 mmol/L (ref 135–146)
Total Bilirubin: 0.4 mg/dL (ref 0.2–1.2)
Total Protein: 6.9 g/dL (ref 6.1–8.1)

## 2021-07-29 LAB — C-REACTIVE PROTEIN: CRP: 2.4 mg/L

## 2021-08-26 ENCOUNTER — Encounter: Payer: Self-pay | Admitting: Physical Medicine and Rehabilitation

## 2021-08-26 ENCOUNTER — Encounter
Payer: BC Managed Care – PPO | Attending: Physical Medicine and Rehabilitation | Admitting: Physical Medicine and Rehabilitation

## 2021-08-26 ENCOUNTER — Other Ambulatory Visit: Payer: Self-pay

## 2021-08-26 VITALS — BP 129/79 | HR 83 | Temp 98.1°F | Ht 68.0 in

## 2021-08-26 DIAGNOSIS — Z993 Dependence on wheelchair: Secondary | ICD-10-CM

## 2021-08-26 DIAGNOSIS — M4644 Discitis, unspecified, thoracic region: Secondary | ICD-10-CM | POA: Diagnosis not present

## 2021-08-26 DIAGNOSIS — M4620 Osteomyelitis of vertebra, site unspecified: Secondary | ICD-10-CM | POA: Diagnosis not present

## 2021-08-26 DIAGNOSIS — R600 Localized edema: Secondary | ICD-10-CM

## 2021-08-26 DIAGNOSIS — G8222 Paraplegia, incomplete: Secondary | ICD-10-CM | POA: Diagnosis not present

## 2021-08-26 MED ORDER — DULOXETINE HCL 30 MG PO CPEP: 30.0000 mg | ORAL_CAPSULE | Freq: Every day | ORAL | 0 refills | Status: DC

## 2021-08-26 NOTE — Patient Instructions (Signed)
Pt is a 71 yr old male with hx of epidural abscess with incomplete paraplegia ?status post T9-T10 laminectomy decompression followed by reconstruction T6-T10 posterior decompression fusion removal of hardware and extension of fusion to the thoracic 11 decompression of spinal cord direct T9-T11 laminectomy arthrodesis T6-T11 03/31/2021; also has post op and chronic pain; muscle spasms; and newly dx'd HTN; as well as OSA, gout; and IBS, with obesity; F/u on paraplegia.  ? ?Lives in [place]- so will hand write a prescription for referral to outpt PT- eval and treat- weaning corset TLSO- for comfort.  ? ?2. Will wait on outpt OT- for now- might need in future.  ? ? ?3. Off muscle relaxants. Robaxin/and Zanaflex.  ? ? ?4. Will wean Cymbalta to 30 mg daily- x 1 month then can stop- however if tingling and burning get worse, go back to 60 mg daily and stay on another 6-12 months.  ? ? ?5. Will still need PRAFOs- so get some velcro- and fix the PRAFOs and wear at night.  ? ? ?6. No spasticity- don't need to treat ? ?7. Con't compression socks. For LE edema-  ? ?8. Still on PO ABX and BP meds per ID/PCP ? ?9. F/U in 3 months- double appt- SCI ?

## 2021-08-26 NOTE — Progress Notes (Signed)
Subjective:    [name], male    DOB: 09-Mar-1951, 71 y.o.   MRN: [id number]  HPI  Pt is a 71 yr old male with hx of epidural abscess with incomplete paraplegia status post T9-T10 laminectomy decompression followed by reconstruction T6-T10 posterior decompression fusion removal of hardware and extension of fusion to the thoracic 11 decompression of spinal cord direct T9-T11 laminectomy arthrodesis T6-T11 03/31/2021; also has post op and chronic pain; muscle spasms; and newly dx'd HTN; as well as OSA, gout; and IBS, with obesity;  Here for f/u on paraplegia.   Hasn't transitioned to outpt PT yet- doing PT and OT-  Has another week of H/H therapy then can transition.   Trouble still getting in/out of care-  "Not pretty".   Still using steady to transition from bed to w/c- practicing with sliding board- only for car- basically, still using steady.  Getting stronger.  Walked 100 steps in house at 1 time yesterday.   Weaning off Corset-slowly-  but uses when getting/out/in car and when and when doing therapy.   Still need helps to dress self- esp compression socks- - can get shirt on; pants when gets into steady.    Bowel/bladder- Bladder OK Bowels- a little constipated- 4-5x/week- no incontinence.  Doesn't need bowel program- hasn't taken any laxatives this week- takes occ.    Muscle relaxants- - hasn't taken either Robaxin or Zanaflex since last here.    Asking about coming off Cymbalta- still having some tingling- esp in AM- nerves "waking up".      Pain Inventory Average Pain 1 Pain Right Now 1 My pain is constant, dull, and tingling  LOCATION OF PAIN  lower back pain, pain in both knees  BOWEL Number of stools per week: 4-5 Oral laxative use Yes    BLADDER Normal and Pads    Mobility walk with assistance use a walker how many minutes can you walk? 5 minutes ability to climb steps?  no do you drive?  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  yes  Neuro/Psych tingling trouble walking  Prior Studies Any changes since last visit?  no  Physicians involved in your care Any changes since last visit?  no   Family History  Problem Relation Age of Onset   Hypertension Mother    Arthritis Mother    Stomach cancer Father    Hypertension Brother    Social History   Socioeconomic History   Marital status: Married    Spouse name: Not on file   Number of children: 2   Years of education: Not on file   Highest education level: Not on file  Occupational History   Not on file  Tobacco Use   Smoking status: Never   Smokeless tobacco: Never  Vaping Use   Vaping Use: Never used  Substance and Sexual Activity   Alcohol use: Not Currently    Comment: rare- social    Drug use: No   Sexual activity: Yes  Other Topics Concern   Not on file  Social History Narrative   Not on file   Social Determinants of Health   Financial Resource Strain: Not on file  Food Insecurity: Not on file  Transportation Needs: Not on file  Physical Activity: Not on  file  Stress: Not on file  Social Connections: Not on file   Past Surgical History:  Procedure Laterality Date   ABDOMINAL EXPOSURE N/A 04/18/2020   Procedure: ABDOMINAL EXPOSURE;  Surgeon: [name], MD;  Location: [facility] OR;  Service: Vascular;  Laterality: N/A;   ANTERIOR LAT LUMBAR FUSION Left 04/18/2020   Procedure: ANTERIOR LATERAL LUMBAR FUSION LUMBAR TWO-THREE, LUMBAR THREE-FOUR;  Surgeon: [name], MD;  Location: [facility] OR;  Service: Neurosurgery;  Laterality: Left;   ANTERIOR LUMBAR FUSION N/A 04/18/2020   Procedure: Lumbar Four-Five Lumbar Five- Sacral One Anterior lumbar Interbody Fusion;  Surgeon: [name], MD;  [facility];   Service: Neurosurgery;  Laterality: N/A;   APPLICATION OF INTRAOPERATIVE CT SCAN N/A 04/21/2020   Procedure: APPLICATION OF INTRAOPERATIVE CT SCAN;  Surgeon: [name], MD;  Location[facility];  Service: Neurosurgery;  Laterality: N/A;   BACK SURGERY     lumbar disc surgery   COLONOSCOPY W/ POLYPECTOMY     HERNIA REPAIR Left    left inguinal hernia repair   IR RADIOLOGIST EVAL & MGMT  02/24/2021   KNEE ARTHROSCOPY Right 2003    -scope for meniscus   LUMBAR WOUND DEBRIDEMENT N/A 01/13/2021   Procedure: THORACOLUMBAR WOUND DEBRIDEMENT;  Surgeon: [name], MD;  Location: [facility] OR;  Service: Neurosurgery;  Laterality: N/A;   LUMBAR WOUND DEBRIDEMENT N/A 03/14/2021   Procedure: WOUND EXPLORATION AND DEBRIDEMENT OF ABSCESS;  Surgeon: Lisbeth Renshaw, MD;  Location: [facility] OR;  Service: Neurosurgery;  Laterality: N/A;   POSTERIOR LUMBAR FUSION 4 LEVEL N/A 04/21/2020   Procedure: Thoracic ten to pelvis pedicle screw fixation with osteotomies;  Surgeon: [name], MD;  Location: [facility] OR;  Service: Neurosurgery;  Laterality: N/A;   POSTERIOR LUMBAR FUSION 4 LEVEL N/A 03/31/2021   Procedure: Thoracic six to Thoracic nine  Posterior decompression/Fusion with removal hardware thoracic ten;  Surgeon: [name], MD;  Location: [facility] OR;  Service: Neurosurgery;  Laterality: N/A;   THORACIC LAMINECTOMY FOR EPIDURAL ABSCESS N/A 03/14/2021   Procedure: THORACIC LAMINECTOMY, THORACIC NINE-THORACIC TEN;  Surgeon: Lisbeth Renshaw, MD;  Location: [facility] OR;  Service: Neurosurgery;  Laterality: N/A;   TOTAL HIP ARTHROPLASTY Left 06/15/2016   Procedure: LEFT TOTAL HIP ARTHROPLASTY ANTERIOR APPROACH;  Surgeon: [name], MD;  Location: [facility] ORS;  Service: Orthopedics;  Laterality: Left;   Past Medical History:  Diagnosis Date   Arthritis    arthritis-bilateral knees, left Hip.   Degenerative lumbar spinal stenosis    EKG abnormality    06-08-16- being evaluated by cardiologist  in [place]   Idiopathic  scoliosis of lumbar region    Joint stiffness    Knee pain    Leg swelling    Low back pain    Achy, shooting pain.  Hurts to Walk or stand up straight.   Lumbar radiculopathy    Scoliosis concern    Sleep apnea    Bipap use nightly 25/19 -3l/m oxygen   Spondylolisthesis of lumbar region    Transfusion history    [place] ,[place] after bleeding post colon polyp removal and colonscopy procedure   There were no vitals taken for this visit.  Opioid Risk Score:   Fall Risk Score:  `1  Depression screen PHQ 2/9  Depression screen [facility] 2/9 06/02/2021 05/20/2021 02/26/2021  Decreased Interest 0 0 0  Down, Depressed, Hopeless 0 0 0  PHQ - 2 Score 0 0 0  Altered sleeping 0 0 1  Tired, decreased energy 0 0 1  Change  in appetite 0 0 1  Feeling bad or failure about yourself  0 0 0  Trouble concentrating 0 0 0  Moving slowly or fidgety/restless 0 0 0  Suicidal thoughts 0 0 0  PHQ-9 Score 0 0 3    Review of Systems  Respiratory:  Positive for apnea (CPAP).   Cardiovascular:  Positive for leg swelling (ankles).  Gastrointestinal:  Positive for constipation.  All other systems reviewed and are negative.     Objective:   Physical Exam Awake, alert, appropriate, in manual w/c; with wife in room, NAD  MS:  RLE_ HF 4/5, KE 4+/5; DF 2/5; PF 4+/5  LLE- HF 2/5; KE 4/5; DF 2- to 2/5; and PF 4/5  Neuro: Decreased to light touch from groin down so L1 downwards decreased to light touch and cold- however is better than it was per pt. B/L  Absent DTRs at patella and achilles BL  No increased tone- no clonus in LE's B/L  3+ LE edema to mid calf B/L      Assessment & Plan:   Pt is a 71 yr old male with hx of epidural abscess with incomplete paraplegia status post T9-T10 laminectomy decompression followed by reconstruction T6-T10 posterior decompression fusion removal of hardware and extension of fusion to the thoracic 11 decompression of spinal cord direct T9-T11 laminectomy arthrodesis  T6-T11 03/31/2021; also has post op and chronic pain; muscle spasms; and newly dx'd HTN; as well as OSA, gout; and IBS, with obesity; F/u on paraplegia.   Lives in [place]- so will hand write a prescription for referral to outpt PT- eval and treat- weaning corset TLSO- for comfort.   2. Will wait on outpt OT- for now- might need in future.    3. Off muscle relaxants. Robaxin/and Zanaflex.    4. Will wean Cymbalta to 30 mg daily- x 1 month then can stop- however if tingling and burning get worse, go back to 60 mg daily and stay on another 6-12 months.    5. Will still need PRAFOs- so get some velcro- and fix the PRAFOs and wear at night.    6. No spasticity- don't need to treat  7. Con't compression socks. For LE edema-   8. Still on PO ABX and BP meds per ID/PCP  9. F/U in 3 months- double appt- SCI   I spent a total of 31   minutes on total care today- >50% coordination of care- due to discussion of weaning meds and MRI possible getting - per ID/NSU.

## 2021-09-23 ENCOUNTER — Other Ambulatory Visit: Payer: Self-pay | Admitting: Physical Medicine and Rehabilitation

## 2021-09-27 ENCOUNTER — Other Ambulatory Visit: Payer: Self-pay | Admitting: Physical Medicine and Rehabilitation

## 2021-10-22 ENCOUNTER — Other Ambulatory Visit: Payer: Self-pay | Admitting: Physical Medicine and Rehabilitation

## 2021-10-27 ENCOUNTER — Ambulatory Visit: Payer: BC Managed Care – PPO | Admitting: Internal Medicine

## 2021-10-27 ENCOUNTER — Other Ambulatory Visit: Payer: Self-pay

## 2021-10-27 VITALS — BP 129/73 | HR 59 | Temp 97.2°F | Resp 16 | Ht 68.0 in | Wt 247.1 lb

## 2021-10-27 DIAGNOSIS — M4624 Osteomyelitis of vertebra, thoracic region: Secondary | ICD-10-CM

## 2021-10-27 DIAGNOSIS — T847XXD Infection and inflammatory reaction due to other internal orthopedic prosthetic devices, implants and grafts, subsequent encounter: Secondary | ICD-10-CM | POA: Diagnosis not present

## 2021-10-27 NOTE — Progress Notes (Signed)
?  ? ? ? ? ?[facility] ? ?Patient Active Problem List  ? Diagnosis Date Noted  ? 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  ? Incomplete paraplegia ([place]) 05/20/2021  ? Bilateral leg edema 05/20/2021  ? Drug induced constipation   ? Pressure injury of skin 04/14/2021  ? Hypoalbuminemia due to protein-calorie malnutrition ([place])   ? Essential hypertension   ? Acute blood loss anemia   ? Labile blood pressure   ? Postoperative pain   ? Discitis of thoracic region   ? 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 Debility   ? Prediabetes   ? Constipation by delayed colonic transit   ? Constipation   ? Hyponatremia   ? Acute on chronic anemia   ? Retroperitoneal fluid collection   ? Pseudogout of knee, right 02/03/2021  ? Sepsis ([place]) 02/03/2021  ? Sepsis due to Escherichia coli (E. coli) ([place]) 02/01/2021  ? Empyema ([place])   ? Anasarca 01/20/2021  ? OSA (obstructive sleep apnea) 01/20/2021  ? Obesity, Class III, BMI 40-49.9 (morbid obesity) ([place]) 01/20/2021  ? Pleural effusion   ? Vertebral osteomyelitis ([place])   ? Hardware complicating wound infection ([place])   ? Abscess of back 01/11/2021  ? Lumbar scoliosis 04/18/2020  ? Back pain 12/25/2019  ? S/P left THA, AA 06/15/2016  ? ? ? ? ?Subjective:  ? ? [name], male    DOB: 05-10-1951, 71 y.o.   MRN: [id number] ? ?CC: f/u vertebral thoracic osteomyelitis/empyema/intra-peritoneal abscess ? ?HPI: ? ?Marco Kennedy is a 71 y.o. male prior extensive thoracolumbar hardware, complicated by thoracic vertebral om/epidural abscess here for hospital discharge f/u ? ?Patient has had complex course with multiple admissions for the same since 12/2020. he had I&D 7/19 with cx showing ecoli, then discharged but readmitted 7/22 with dyspnea found to have empyema (chest tubes bilateral but cx negative -- lights exudative criteria met however). During the second admission also ct scan finding of left retroperitoneal fluid collection s/p IR drain placement 8/02, and  right knee infusion s/p aspiration 8/03 without evidence of infection. ? ?Other previous w/u include a negative tte. And both admissions blood culture negative. ? ?He continues on oral abx cephalexin and was most recently readmitted 9/17-10/07 and discharged to inpatient rehab until 11/11. He had worsening pain LE and mri repeat 9/16 showed worsening thoracic epidural collection. He underwent I&D on 9/17 with more hardware stabilization placed on 10/04. Operative culture was negative; bcx negative as well. He was discharged on amox-->cefadroxil. Of note he had repeat abd/pelv/chest ct as well which showed increased previous abd fluid collection -- pigtail catheter placed -- cx negative. All catheters removed prior to moving to inpatient rehab ? ? ? ?--------------- ?06/02/2021 id clinic f/u ?Not taking morphine since hospital discharge ?On cymbalta and taking tylenol ?Had sensation deficit prior to admission 02/2021 but that is now coming back ?Strength still very weak -- on wheel chair, but strength improving. Can't stand yet ?Patient has in house pt/ot ?No diarrhea/rash/n/v ?Wife reports some redness from being in wheel chair ?No chest pain/cough/sob ?No abd pain ? ?07/28/21 id clinic f/u ?Doing pt/ot -- walking about 20 ft with fww and some assist ?No n/v/diarrhea/rash ?No fever/chill ?Pain well controlled -- cymbalta, voltaren. No narcotics products ?Wears back brace ?To see nsg again 08/2021 and will gradually be off the brace ? ? ?10/27/21 id clinic f/u ?Patient doing well ?He will be going to [place] in couple weeks for his son's wedding 5/20 ?  Tolerating cefadroxil 1 gram twice a day.  ?His nsg is planning a repeat spine mri soon  ?No n/v/diarrhea ?No back pain ?Able to PT walks 250 ft with fww. ? ?No Known Allergies ? ? ? ?Outpatient Medications Prior to Visit  ?Medication Sig Dispense Refill  ? amLODipine (NORVASC) 10 MG tablet Take 1 tablet (10 mg total) by mouth daily. 30 tablet 0  ? carvedilol (COREG)  3.125 MG tablet Take 1 tablet (3.125 mg total) by mouth 2 (two) times daily with a meal. 60 tablet 0  ? cefadroxil (DURICEF) 500 MG capsule Take 500 mg by mouth 2 (two) times daily. I g taking 2 500 mg tab twice daily.    ? cholecalciferol (VITAMIN D) 25 MCG (1000 UNIT) tablet Take by mouth.    ? diclofenac (VOLTAREN) 75 MG EC tablet Take by mouth.    ? DULoxetine (CYMBALTA) 60 MG capsule Take 60 mg by mouth at bedtime.    ? methocarbamol (ROBAXIN) 750 MG tablet Take 750 mg by mouth.    ? carvedilol (COREG) 12.5 MG tablet Take by mouth. (Patient not taking: Reported on 08/26/2021)    ? cefadroxil (DURICEF) 1 g tablet Take by mouth.    ? DULoxetine (CYMBALTA) 30 MG capsule Take 1 capsule (30 mg total) by mouth daily. 30 capsule 0  ? ?No facility-administered medications prior to visit.  ? ? ? ?Social History  ? ?Socioeconomic History  ? Marital status: Married  ?  Spouse name: Not on file  ? Number of children: 2  ? Years of education: Not on file  ? Highest education level: Not on file  ?Occupational History  ? Not on file  ?Tobacco Use  ? Smoking status: Never  ? Smokeless tobacco: Never  ?Vaping Use  ? Vaping Use: Never used  ?Substance and Sexual Activity  ? Alcohol use: Not Currently  ?  Comment: rare- social   ? Drug use: No  ? Sexual activity: Yes  ?Other Topics Concern  ? Not on file  ?Social History Narrative  ? Not on file  ? ?Social Determinants of Health  ? ?Financial Resource Strain: Not on file  ?Food Insecurity: Not on file  ?Transportation Needs: Not on file  ?Physical Activity: Not on file  ?Stress: Not on file  ?Social Connections: Not on file  ?Intimate Partner Violence: Not on file  ? ? ? ? ?Review of Systems ?   ?All other ros negative ? ?Objective:  ?  ?BP 129/73   Pulse (!) 59   Temp (!) 97.2 ?F (36.2 ?C)   Resp 16   Ht 5' 8"  (1.727 m)   Wt 247 lb 2.2 oz (112.1 kg)   SpO2 96%   BMI 37.58 kg/m?  ?Nursing note and vital signs reviewed. ? ?Physical Exam ? ?  ?General/constitutional: no  distress, pleasant ?HEENT: Normocephalic, PER, Conj Clear, EOMI, Oropharynx clear ?Neck supple ?CV: rrr no mrg ?Lungs: clear to auscultation, normal respiratory effort ?Abd: Soft, Nontender ?Ext: no edema ?Skin: No Rash ? ? ?Neuro: bilateral LE strength R>L overall around 4-5/5 -- stable  ?MSK: back nontender. Incision healed no fluctuance ? ?Labs: ?Lab Results  ?Component Value Date  ? WBC 6.8 07/28/2021  ? HGB 14.3 07/28/2021  ? HCT 44.1 07/28/2021  ? MCV 87.8 07/28/2021  ? PLT 304 07/28/2021  ? ?Last metabolic panel ?Lab Results  ?Component Value Date  ? GLUCOSE 176 (H) 07/28/2021  ? NA 140 07/28/2021  ? K 4.1 07/28/2021  ?  CL 102 07/28/2021  ? CO2 30 07/28/2021  ? BUN 23 07/28/2021  ? CREATININE 0.84 07/28/2021  ? GFRNONAA >60 04/27/2021  ? GFRAA >60 06/16/2016  ? CALCIUM 9.5 07/28/2021  ? PHOS 3.8 03/19/2021  ? PROT 6.9 07/28/2021  ? ALBUMIN 2.4 (L) 04/06/2021  ? BILITOT 0.4 07/28/2021  ? ALKPHOS 68 04/06/2021  ? AST 24 07/28/2021  ? ALT 30 07/28/2021  ? ANIONGAP 9 04/27/2021  ? ? ?Crp: ?07/28/21   2.4 (<8) ?05/2021    5.8 (<8) ?10/31   0.5 (<1) ?10/06   8.1 (<1) ?9/20   15.1 (<1) ?8/19   10.5 (<1) ?7/26   36    (<1) ? ?Micro: ?9/17 and 10/04 operative back wound cx negative ?9/17 bcx negative ?9/17 abd fluid collection cx negative ? ?Serology: ? ?Imaging: ?8/20 abdpelv ct ?1. Left pelvic drain in the left iliac fossa has effectively drained ?nearly all of the previously noted left-sided pelvic fluid ?collection, as discussed above. ?2. Persistent bilateral pleural effusions. On the left, this is ?simple in appearance on today's examination. On the right, there is ?persistent loculation and persistent gas and pleural ?thickening/enhancement, indicative of residual empyema. ?3. Aortic atherosclerosis. ?4. Additional incidental findings, as above. ? ?8/03 mri lumbar thoracic spine ?1. Unchanged appearance of ventral epidural abscess extending from ?T7-T11 with likely source at the T10-11 disc space.  Circumferential ?dural thickening at the T10 level again effaces the thecal sac with ?mass effect on the spinal cord. ?2. New abnormal disc signal at T9-10 and unchanged abnormal signal ?T10-11, consistent with discitis-osteomyelitis.

## 2021-10-27 NOTE — Patient Instructions (Signed)
Let's await mri of your back and labs ? ?We are planning to decrease cefadroxil 500 mg twice a day if both above look good ? ? ?Please communicate once you have report on mri and we can decide on the antibiotics ? ?Otherwise let's meet again 6 months ?

## 2021-10-28 LAB — COMPLETE METABOLIC PANEL WITH GFR
AG Ratio: 1.7 (calc) (ref 1.0–2.5)
ALT: 22 U/L (ref 9–46)
AST: 21 U/L (ref 10–35)
Albumin: 4.2 g/dL (ref 3.6–5.1)
Alkaline phosphatase (APISO): 70 U/L (ref 35–144)
BUN/Creatinine Ratio: 43 (calc) — ABNORMAL HIGH (ref 6–22)
BUN: 31 mg/dL — ABNORMAL HIGH (ref 7–25)
CO2: 30 mmol/L (ref 20–32)
Calcium: 9.2 mg/dL (ref 8.6–10.3)
Chloride: 103 mmol/L (ref 98–110)
Creat: 0.72 mg/dL (ref 0.70–1.28)
Globulin: 2.5 g/dL (calc) (ref 1.9–3.7)
Glucose, Bld: 93 mg/dL (ref 65–99)
Potassium: 4.3 mmol/L (ref 3.5–5.3)
Sodium: 140 mmol/L (ref 135–146)
Total Bilirubin: 0.4 mg/dL (ref 0.2–1.2)
Total Protein: 6.7 g/dL (ref 6.1–8.1)
eGFR: 98 mL/min/{1.73_m2} (ref >=60)

## 2021-10-28 LAB — CBC
HCT: 43.2 % (ref 38.5–50.0)
Hemoglobin: 14.6 g/dL (ref 13.2–17.1)
MCH: 30 pg (ref 27.0–33.0)
MCHC: 33.8 g/dL (ref 32.0–36.0)
MCV: 88.7 fL (ref 80.0–100.0)
MPV: 9.9 fL (ref 7.5–12.5)
Platelets: 296 10*3/uL (ref 140–400)
RBC: 4.87 10*6/uL (ref 4.20–5.80)
RDW: 13.5 % (ref 11.0–15.0)
WBC: 7.6 10*3/uL (ref 3.8–10.8)

## 2021-10-28 LAB — C-REACTIVE PROTEIN: CRP: 3 mg/L (ref <8.0)

## 2021-11-05 ENCOUNTER — Other Ambulatory Visit: Payer: Self-pay | Admitting: Internal Medicine

## 2021-11-05 NOTE — Telephone Encounter (Signed)
Please advise, office note mentions possible decreasing dose depending on MRI results. Thanks!  ?

## 2021-11-05 NOTE — Telephone Encounter (Signed)
Patient reports he will be leaving town next Tuesday and would like to get medication refilled prior to leaving.  ?Marco Kennedy ? ?

## 2021-12-04 ENCOUNTER — Encounter: Payer: Self-pay | Admitting: Physical Medicine and Rehabilitation

## 2021-12-04 ENCOUNTER — Encounter
Payer: BC Managed Care – PPO | Attending: Physical Medicine and Rehabilitation | Admitting: Physical Medicine and Rehabilitation

## 2021-12-04 VITALS — BP 120/76 | HR 88 | Ht 68.0 in | Wt 273.4 lb

## 2021-12-04 DIAGNOSIS — R5381 Other malaise: Secondary | ICD-10-CM | POA: Diagnosis present

## 2021-12-04 DIAGNOSIS — G8222 Paraplegia, incomplete: Secondary | ICD-10-CM | POA: Diagnosis present

## 2021-12-04 DIAGNOSIS — M21372 Foot drop, left foot: Secondary | ICD-10-CM

## 2021-12-04 DIAGNOSIS — M21371 Foot drop, right foot: Secondary | ICD-10-CM | POA: Insufficient documentation

## 2021-12-04 DIAGNOSIS — M4620 Osteomyelitis of vertebra, site unspecified: Secondary | ICD-10-CM | POA: Diagnosis present

## 2021-12-04 DIAGNOSIS — R600 Localized edema: Secondary | ICD-10-CM | POA: Diagnosis present

## 2021-12-04 MED ORDER — DULOXETINE HCL 60 MG PO CPEP: 60.0000 mg | ORAL_CAPSULE | Freq: Every day | ORAL | 3 refills | Status: DC

## 2021-12-04 NOTE — Patient Instructions (Addendum)
Pt is a 71 yr old male with hx of epidural abscess with incomplete paraplegia status post T9-T10 laminectomy decompression followed by reconstruction T6-T10 posterior decompression fusion removal of hardware and extension of fusion to the thoracic 11 decompression of spinal cord direct T9-T11 laminectomy arthrodesis T6-T11 03/31/2021; also has post op and chronic pain; muscle spasms; and newly dx'd HTN; as well as OSA, gout; and IBS, with obesity; F/u on paraplegia.    Discussed with patient using exercise ball- just with sitting on it- after 30 minutes of sitting gets boring, add exercises- using therabands, lifting 1 leg off floor, etc.  Needs to work on goals- with PT- to work on endurance, and further distances and to decrease risk of falls- working on balance. Still need to conitnue outpatient PT for above goals.   2. Suggest cortisone cream for rash- max strength- if not enough, can try calling PCP to get stronger steroid- if gets worse, likely fungal- and would need treatment via PCP as well.    3. SCI support group July 27th- Thursday- 6-7 pm- [facility] [place]-   4. Continue therapy- as ordered.    5. Con't Cymbalta 60 mg daily- will print out for pt since had issues with Rx's- 60 mg nightly- #90 with 3 refills.    6. No spasticity  7. F/U in 4 months- double appt.    8. USE AFOS when walking! Mainly on Left.

## 2021-12-04 NOTE — Progress Notes (Signed)
Subjective:    [name], male    DOB: 1951/02/27, 71 y.o.   MRN: [id number]  HPI Pt is a 71 yr old male with hx of epidural abscess with incomplete paraplegia status post T9-T10 laminectomy decompression followed by reconstruction T6-T10 posterior decompression fusion removal of hardware and extension of fusion to the thoracic 11 decompression of spinal cord direct T9-T11 laminectomy arthrodesis T6-T11 03/31/2021; also has post op and chronic pain; muscle spasms; and newly dx'd HTN; as well as OSA, gout; and IBS, with obesity; F/u on paraplegia.    Walking more- with RW  Weaning off W/C Working more on balance with PT and at home- by standing in RW at home.  Also doing therabands-  In therapy, can walk 250-300 ft- 3 laps around exercise area.  From car to house- 75 ft - Working on ~ 1000 ft/day.   In new remodelled house- Is very loud- not many walls.   Off muscle relaxants Cymbalta- went back to 60 mg daily - felt a little more burning/blahhh/tingling so went back from 30 mg to 60 mg daily again.   Still having swelling, but usually wears compression socks to knees-  Pain- not an issue- just a little stiff when gets OOB or after therapy.   New rash on inner instep of feet B/L- came around 2-3 weeks ago- not itching, not burning.    Using AFOs in therapy until last week    Social Hx: Got to church last week- first time lately.  Son got married a few weeks ago in [place].  Had to drive straight back- from new [place]- but down- took 2 days.  Rented [employer]- went well.     Pain Inventory Average Pain 1 Pain Right Now 0 My pain is intermittent and aching  LOCATION OF PAIN  Back  BOWEL Number of stools per week: 3 Oral laxative use Yes  Type of laxative Ducolax  BLADDER Normal    Mobility walk with assistance use a walker use a wheelchair transfers alone Do you have any goals in this area?  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  no   Family History  Problem Relation Age of Onset   Hypertension Mother    Arthritis Mother    Stomach cancer Father    Hypertension Brother    Social History   Socioeconomic History   Marital status: Married    Spouse name: Not on file   Number of children: 2   Years of education: Not on file   Highest education level: Not on file  Occupational History   Not on file  Tobacco Use   Smoking status: Never   Smokeless tobacco: Never  Vaping Use   Vaping Use: Never used  Substance and Sexual Activity   Alcohol use: Not Currently    Comment: rare- social    Drug use: No   Sexual activity: Yes  Other Topics Concern   Not on file  Social History Narrative   Not on file   Social Determinants of Health   Financial Resource Strain: Not on file  Food Insecurity: Not on file  Transportation Needs: Not on file  Physical Activity: Not on file  Stress: Not on file  Social Connections: Not on file   Past Surgical History:  Procedure  Laterality Date   ABDOMINAL EXPOSURE N/A 04/18/2020   Procedure: ABDOMINAL EXPOSURE;  Surgeon: Marty Heck, MD;  Location: [facility];  Service: Vascular;  Laterality: N/A;   ANTERIOR LAT LUMBAR FUSION Left 04/18/2020   Procedure: ANTERIOR LATERAL LUMBAR FUSION LUMBAR TWO-THREE, LUMBAR THREE-FOUR;  Surgeon: Erline Levine, MD;  Location: [facility];  Service: Neurosurgery;  Laterality: Left;   ANTERIOR LUMBAR FUSION N/A 04/18/2020   Procedure: Lumbar Four-Five Lumbar Five- Sacral One Anterior lumbar Interbody Fusion;  Surgeon: Erline Levine, MD;  Location: [facility];  Service: Neurosurgery;  Laterality: N/A;   APPLICATION OF INTRAOPERATIVE CT SCAN N/A 04/21/2020   Procedure: APPLICATION OF INTRAOPERATIVE CT SCAN;  Surgeon: Erline Levine, MD;  Location: [facility];  Service:  Neurosurgery;  Laterality: N/A;   BACK SURGERY     lumbar disc surgery   COLONOSCOPY W/ POLYPECTOMY     HERNIA REPAIR Left    left inguinal hernia repair   IR RADIOLOGIST EVAL & MGMT  02/24/2021   KNEE ARTHROSCOPY Right 2003    -scope for meniscus   LUMBAR WOUND DEBRIDEMENT N/A 01/13/2021   Procedure: THORACOLUMBAR WOUND DEBRIDEMENT;  Surgeon: Erline Levine, MD;  Location: [facility];  Service: Neurosurgery;  Laterality: N/A;   LUMBAR WOUND DEBRIDEMENT N/A 03/14/2021   Procedure: WOUND EXPLORATION AND DEBRIDEMENT OF ABSCESS;  Surgeon: Consuella Lose, MD;  Location: [facility];  Service: Neurosurgery;  Laterality: N/A;   POSTERIOR LUMBAR FUSION 4 LEVEL N/A 04/21/2020   Procedure: Thoracic ten to pelvis pedicle screw fixation with osteotomies;  Surgeon: Erline Levine, MD;  Location: [facility];  Service: Neurosurgery;  Laterality: N/A;   POSTERIOR LUMBAR FUSION 4 LEVEL N/A 03/31/2021   Procedure: Thoracic six to Thoracic nine  Posterior decompression/Fusion with removal hardware thoracic ten;  Surgeon: Erline Levine, MD;  Location: [facility];  Service: Neurosurgery;  Laterality: N/A;   THORACIC LAMINECTOMY FOR EPIDURAL ABSCESS N/A 03/14/2021   Procedure: THORACIC LAMINECTOMY, THORACIC NINE-THORACIC TEN;  Surgeon: Consuella Lose, MD;  Location: [facility];  Service: Neurosurgery;  Laterality: N/A;   TOTAL HIP ARTHROPLASTY Left 06/15/2016   Procedure: LEFT TOTAL HIP ARTHROPLASTY ANTERIOR APPROACH;  Surgeon: Paralee Cancel, MD;  Location: [facility];  Service: Orthopedics;  Laterality: Left;   Past Medical History:  Diagnosis Date   Arthritis    arthritis-bilateral knees, left Hip.   Degenerative lumbar spinal stenosis    EKG abnormality    06-08-16- being evaluated by cardiologist  in [place]   Idiopathic scoliosis of lumbar region    Joint stiffness    Knee pain    Leg swelling    Low back pain    Achy, shooting pain.  Hurts to Walk or stand up straight.   Lumbar radiculopathy    Scoliosis concern     Sleep apnea    Bipap use nightly 25/19 -3l/m oxygen   Spondylolisthesis of lumbar region    Transfusion history    [place] ,[place] after bleeding post colon polyp removal and colonscopy procedure   BP 120/76   Pulse 88   Ht 5\' 8"  (1.727 m)   Wt 273 lb 6.4 oz (124 kg)   SpO2 96%   BMI 41.57 kg/m   Opioid Risk Score:   Fall Risk Score:  `1  Depression screen [facility] 2/9     12/04/2021   11:03 AM 10/27/2021   10:23 AM 08/26/2021   10:49 AM 06/02/2021   10:19 AM 05/20/2021   12:35 PM 02/26/2021    1:21 PM  Depression screen PHQ  2/9  Decreased Interest 0 0 0 0 0 0  Down, Depressed, Hopeless 0 0 0 0 0 0  PHQ - 2 Score 0 0 0 0 0 0  Altered sleeping    0 0 1  Tired, decreased energy    0 0 1  Change in appetite    0 0 1  Feeling bad or failure about yourself     0 0 0  Trouble concentrating    0 0 0  Moving slowly or fidgety/restless    0 0 0  Suicidal thoughts    0 0 0  PHQ-9 Score    0 0 3    Review of Systems  Musculoskeletal:  Positive for gait problem.  Skin:  Positive for rash.  All other systems reviewed and are negative.      Objective:   Physical Exam  Awake, alert, appropriate, using RW today; accompanied by wife, NAD Rash on instep of feet on dorsum/inside of B/L feet- reticular rash appearing- not raised 3+ Feet pitting edema- gets to 1-2+ by just below knees B/L  MS: RLE- HF 4-/5; KE 5-5; DF 3/5; PF 4/5 LLE- HF 2+/5; KE 5-/5; DF 2-/5 and PF 4/5  Neuro: No increased tone/spasticity Absent DTRs  Gait- circumducting on L foot due to foot drop    Assessment & Plan:   Pt is a 71 yr old male with hx of epidural abscess with incomplete paraplegia status post T9-T10 laminectomy decompression followed by reconstruction T6-T10 posterior decompression fusion removal of hardware and extension of fusion to the thoracic 11 decompression of spinal cord direct T9-T11 laminectomy arthrodesis T6-T11 03/31/2021; also has post op and chronic pain; muscle spasms; and newly dx'd HTN;  as well as OSA, gout; and IBS, with obesity; F/u on paraplegia.    Discussed with patient using exercise ball- just with sitting on it- after 30 minutes of sitting gets boring, add exercises- using therabands, lifting 1 leg off floor, etc.  Needs to work on goals- with PT- to work on endurance, and further distances and to decrease risk of falls- working on balance. Still need to conitnue outpatient PT for above goals.   2. Suggest cortisone cream for rash- max strength- if not enough, can try calling PCP to get stronger steroid- if gets worse, likely fungal- and would need treatment via PCP as well.    3. SCI support group July 27th- Thursday- 6-7 pm- [facility] [place]-   4. Continue therapy- as ordered.    5. Con't Cymbalta 60 mg daily- will print out for pt since had issues with Rx's- 60 mg nightly- #90 with 3 refills.    6. No spasticity  7. F/U in 4 months- double appt.   8. Circumducting on L foot- needs to wear L AFO to prevent falls.   I spent a total of  23  minutes on total care today- >50% coordination of care- due to discussing core strength

## 2022-01-02 ENCOUNTER — Other Ambulatory Visit: Payer: Self-pay | Admitting: Physical Medicine and Rehabilitation

## 2022-02-01 ENCOUNTER — Other Ambulatory Visit: Payer: Self-pay | Admitting: Physical Medicine and Rehabilitation

## 2022-02-08 ENCOUNTER — Other Ambulatory Visit: Payer: Self-pay | Admitting: Physical Medicine and Rehabilitation

## 2022-02-08 ENCOUNTER — Other Ambulatory Visit: Payer: Self-pay | Admitting: Internal Medicine

## 2022-02-08 NOTE — Telephone Encounter (Signed)
Please advise Patient has not had MRI done. Ortho is holding on MRI until Oct/ Nov

## 2022-03-23 ENCOUNTER — Other Ambulatory Visit (HOSPITAL_COMMUNITY): Payer: Self-pay | Admitting: Neurological Surgery

## 2022-03-23 DIAGNOSIS — M4620 Osteomyelitis of vertebra, site unspecified: Secondary | ICD-10-CM

## 2022-04-02 ENCOUNTER — Encounter: Payer: BC Managed Care – PPO | Admitting: Physical Medicine and Rehabilitation

## 2022-04-16 ENCOUNTER — Ambulatory Visit (HOSPITAL_COMMUNITY)
Admission: RE | Admit: 2022-04-16 | Discharge: 2022-04-16 | Disposition: A | Discharge status: Home / Self Care | Payer: Medicare Other | Source: Ambulatory Visit | Attending: Neurological Surgery | Admitting: Neurological Surgery

## 2022-04-16 DIAGNOSIS — M4620 Osteomyelitis of vertebra, site unspecified: Secondary | ICD-10-CM | POA: Diagnosis not present

## 2022-04-16 MED ORDER — GADOBUTROL 1 MMOL/ML IV SOLN
10.0000 mL | Freq: Once | INTRAVENOUS | Status: AC | PRN
  Administered 2022-04-16: 10 mL via INTRAVENOUS

## 2022-04-23 ENCOUNTER — Other Ambulatory Visit (HOSPITAL_COMMUNITY): Payer: Self-pay | Admitting: Neurological Surgery

## 2022-04-23 ENCOUNTER — Other Ambulatory Visit: Payer: Self-pay | Admitting: Neurological Surgery

## 2022-04-23 DIAGNOSIS — R911 Solitary pulmonary nodule: Secondary | ICD-10-CM

## 2022-04-27 ENCOUNTER — Other Ambulatory Visit (HOSPITAL_COMMUNITY): Payer: Medicare Other

## 2022-05-03 ENCOUNTER — Ambulatory Visit (HOSPITAL_COMMUNITY)
Admission: RE | Admit: 2022-05-03 | Discharge: 2022-05-03 | Disposition: A | Discharge status: Home / Self Care | Payer: Medicare Other | Source: Ambulatory Visit | Attending: Neurological Surgery | Admitting: Neurological Surgery

## 2022-05-03 DIAGNOSIS — R911 Solitary pulmonary nodule: Secondary | ICD-10-CM | POA: Diagnosis present

## 2022-05-04 ENCOUNTER — Ambulatory Visit (INDEPENDENT_AMBULATORY_CARE_PROVIDER_SITE_OTHER): Payer: Medicare Other | Admitting: Internal Medicine

## 2022-05-04 ENCOUNTER — Encounter: Payer: Self-pay | Admitting: Internal Medicine

## 2022-05-04 ENCOUNTER — Other Ambulatory Visit: Payer: Self-pay

## 2022-05-04 VITALS — BP 148/90 | HR 107 | Resp 16 | Ht 68.0 in | Wt 280.0 lb

## 2022-05-04 DIAGNOSIS — T847XXD Infection and inflammatory reaction due to other internal orthopedic prosthetic devices, implants and grafts, subsequent encounter: Secondary | ICD-10-CM

## 2022-05-04 DIAGNOSIS — M4620 Osteomyelitis of vertebra, site unspecified: Secondary | ICD-10-CM | POA: Diagnosis present

## 2022-05-04 NOTE — Progress Notes (Signed)
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 coli) ([place]) 02/01/2021   Empyema ([place])    Anasarca 01/20/2021   OSA (obstructive sleep apnea) 01/20/2021   Obesity, Class III, BMI 40-49.9 (morbid obesity) ([place]) 01/20/2021   Pleural effusion    Vertebral osteomyelitis ([place])    Hardware complicating wound infection ([place])    Abscess of back 01/11/2021   Lumbar scoliosis 04/18/2020   Back pain 12/25/2019   S/P left THA, AA 06/15/2016      Subjective:    [name], male    DOB: [birth year], 71 y.o.   MRN: [id number]  CC: f/u vertebral thoracic osteomyelitis/empyema/intra-peritoneal abscess  HPI:  Marco Kennedy is a 71 y.o. male prior extensive thoracolumbar hardware, complicated by thoracic vertebral om/epidural abscess here for hospital discharge f/u  Patient has had complex course with multiple admissions for the same since 12/2020. he had I&D 7/19 with cx showing ecoli, then discharged but readmitted 7/22 with dyspnea found to have empyema (chest tubes bilateral but cx negative -- lights exudative criteria met however). During the second admission also ct scan finding of left retroperitoneal fluid collection  s/p IR drain placement 8/02, and right knee infusion s/p aspiration 8/03 without evidence of infection.  Other previous w/u include a negative tte. And both admissions blood culture negative.  He continues on oral abx cephalexin and was most recently readmitted 9/17-10/07 and discharged to inpatient rehab until 11/11. He had worsening pain LE and mri repeat 9/16 showed worsening thoracic epidural collection. He underwent I&D on 9/17 with more hardware stabilization placed on 10/04. Operative culture was negative; bcx negative as well. He was discharged on amox-->cefadroxil. Of note he had repeat abd/pelv/chest ct as well which showed increased previous abd fluid collection -- pigtail catheter placed -- cx negative. All catheters removed prior to moving to inpatient rehab    --------------- 06/02/2021 id clinic f/u Not taking morphine since hospital discharge On cymbalta and taking tylenol Had sensation deficit prior to admission 02/2021 but that is now coming back Strength still very weak -- on wheel chair, but strength improving. Can't stand yet Patient has in house pt/ot No diarrhea/rash/n/v Wife reports some redness from being in wheel chair No chest pain/cough/sob No abd pain  07/28/21 id clinic f/u Doing pt/ot -- walking about 20 ft with fww and some assist No n/v/diarrhea/rash No fever/chill Pain well controlled -- cymbalta, voltaren. No narcotics products Wears back brace To see nsg again 08/2021 and will gradually be off the brace   10/27/21 id clinic f/u Patient doing well He will be going to [place] in  couple weeks for his son's wedding 5/20 Tolerating cefadroxil 1 gram twice a day.  His nsg is planning a repeat spine mri soon  No n/v/diarrhea No back pain Able to PT walks 250 ft with fww.   04/2022 id clinic f/u Patient's recent mri no abscess Doing well on cefadroxil 1 gram twice a day No n/v/diarrhea He has been gaining some weight Happy with where he is Walks  with fww  No Known Allergies    Outpatient Medications Prior to Visit  Medication Sig Dispense Refill   amLODipine (NORVASC) 10 MG tablet Take 1 tablet (10 mg total) by mouth daily. 30 tablet 0   carvedilol (COREG) 3.125 MG tablet Take 1 tablet (3.125 mg total) by mouth 2 (two) times daily with a meal. 60 tablet 0   cefadroxil (DURICEF) 500 MG capsule TAKE 2 CAPSULES BY MOUTH TWICE A DAY 120 capsule 2   cholecalciferol (VITAMIN D) 25 MCG (1000 UNIT) tablet Take by mouth.     diclofenac (VOLTAREN) 75 MG EC tablet Take by mouth.     DULoxetine (CYMBALTA) 60 MG capsule TAKE 1 CAPSULE (60 MG TOTAL) BY MOUTH AT BEDTIME. 90 capsule 1   No facility-administered medications prior to visit.     Social History   Socioeconomic History   Marital status: Married    Spouse name: Not on file   Number of children: 2   Years of education: Not on file   Highest education level: Not on file  Occupational History   Not on file  Tobacco Use   Smoking status: Never   Smokeless tobacco: Never  Vaping Use   Vaping Use: Never used  Substance and Sexual Activity   Alcohol use: Not Currently    Comment: rare- social    Drug use: No   Sexual activity: Yes  Other Topics Concern   Not on file  Social History Narrative   Not on file   Social Determinants of Health   Financial Resource Strain: Not on file  Food Insecurity: Not on file  Transportation Needs: Not on file  Physical Activity: Not on file  Stress: Not on file  Social Connections: Not on file  Intimate Partner Violence: Not on file      Review of Systems    All other ros negative  Objective:    BP (!) 148/90   Pulse (!) 107   Resp 16   Ht _0  (1.727 m)   Wt 280 lb (127 kg)   SpO2 95%   BMI 42.57 kg/m  Nursing note and vital signs reviewed.  Physical Exam    General/constitutional: no distress, pleasant HEENT: Normocephalic, PER, Conj Clear, EOMI, Oropharynx clear Neck supple CV: rrr no mrg Lungs: clear to  auscultation, normal respiratory effort Abd: Soft, Nontender Ext: no edema Skin: No Rash  Neuro: bilateral LE strength R>L overall around 4-5/5 -- stable  MSK: back nontender. Incision healed no fluctuance  Labs: Lab Results  Component Value Date   WBC 7.6 10/27/2021   HGB 14.6 10/27/2021   HCT 43.2 10/27/2021   MCV 88.7 10/27/2021   PLT 296 09/98/3382   Last metabolic panel Lab Results  Component Value Date   GLUCOSE 93 10/27/2021   NA 140 10/27/2021   K 4.3 10/27/2021   CL 103 10/27/2021   CO2 30 10/27/2021   BUN 31 (H) 10/27/2021   CREATININE 0.72 10/27/2021   GFRNONAA >60 04/27/2021   GFRAA >60 06/16/2016   CALCIUM 9.2 10/27/2021  PHOS 3.8 03/19/2021   PROT 6.7 10/27/2021   ALBUMIN 2.4 (L) 04/06/2021   BILITOT 0.4 10/27/2021   ALKPHOS 68 04/06/2021   AST 21 10/27/2021   ALT 22 10/27/2021   ANIONGAP 9 04/27/2021    Crp: 10/27/21   3 (<8) 07/28/21   2.4 (<8) 05/2021    5.8 (<8) 10/31   0.5 (<1) 10/06   8.1 (<1) 9/20   15.1 (<1) 8/19   10.5 (<1) 7/26   36    (<1)  Micro: 9/17 and 10/04 operative back wound cx negative 9/17 bcx negative 9/17 abd fluid collection cx negative  Serology:  Imaging: 8/20 abdpelv ct 1. Left pelvic drain in the left iliac fossa has effectively drained nearly all of the previously noted left-sided pelvic fluid collection, as discussed above. 2. Persistent bilateral pleural effusions. On the left, this is simple in appearance on today's examination. On the right, there is persistent loculation and persistent gas and pleural thickening/enhancement, indicative of residual empyema. 3. Aortic atherosclerosis. 4. Additional incidental findings, as above.  8/03 mri lumbar thoracic spine 1. 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 Circumferential dural thickening at the T10 level again effaces the thecal sac with mass effect on the spinal cord. 2. New  abnormal disc signal at T9-10 and unchanged abnormal signal T10-11, consistent with discitis-osteomyelitis. 3. No lumbar spinal canal stenosis. 4. Loculated bilateral pleural collections.  9/24 mri thoracic/lumbar spine 1. Postoperative changes from interval decompressive laminectomy at T9 and T10. Satisfactory appearance of the laminectomy site, with improved stenosis at these levels. However, there is persistent severe spinal stenosis just below the operative site at the level of T10-11 with moderate cord flattening, slightly worsened as compared to 03/14/2021. No definite or convincing cord signal changes at this time. 2. 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 Associated ventral epidural abscess extending from T8 through T11 is overall slightly decreased in size as compared to previous. 3. Persistent paravertebral inflammation and phlegmon, with persistent irregular loculated bilateral pleural collections/empyema. 4. No other new or progressive findings elsewhere within the thoracolumbar spine.  9/24 abd pelv ct with contrast 1. Left pelvic collection has been decompressed with a percutaneous pigtail catheter. Adjacent inflammatory changes have decreased from the prior CT. 2. No new or acute abnormalities within the abdomen or pelvis. 3. Changes of discitis/osteomyelitis of the lower thoracic spine are similar to the prior CT.  10/20 thoracic spine mri 1. Interval superior extension of the thoracolumbar fusion to include T6 through the sacrum. 2. The previously demonstrated changes of diskitis and osteomyelitis at T9-10 and T10-11 have substantially improved, and there is no evidence of residual or recurrent epidural or paraspinal fluid collection. There are some residual marrow changes within the T10 vertebral body, but no progressive bone destruction. 3. No new osseous abnormalities. 4. Possible subpleural right lower lobe pulmonary nodule.  Further evaluation with chest CT recommended.   Assessment & Plan:   Problem List Items Addressed This Visit       Musculoskeletal and Integument   Vertebral osteomyelitis ([place]) - Primary     Other   Hardware complicating wound infection ([place])  No orders of the defined types were placed in this encounter.       Abx: 11/14-c cefadroxil; 11/7 transitioned 500 mg bid  10/07-11/13 amoxicillin 7/30-10/07 cefazolin  7/26-30 cefepime 7/26-7/28 vanc Home iv cefazolin  Assessment/Plan 71 yo male previous thoracolumbar hardware/back surgery 83/4196 complicated by thoracic vertebral om/epidural abscess s/p recent I&D 7/19 with cx ecoli (bcx negative at that time), readmitted a few days after discharged on 7/22 for fluid overload and bilateral pleural effusion and fever, s/p bilateral chest tube placement with effusion appearing exudative. Course also with ct imaging showing left retroperitoneal fluid collection s/p IR drain placement 8/02 with negative cx, and right knee effusion s/p aspiration 8/3 without evidence of infection either.  He had a repeat admission 03/14/21 for worsening pain/numbness in legs with imaging showing worsening epidural process and abd fluid collection, while on abx. He underwent I&D 9/17 and more hardware for stabilization 10/04 -- operative cx negative. Drain in abd abscess placed 9/17 but since removed as repeat abd ct 9/27 showed abscess resolved -- cx negative as well      #vertebral OM/epidural abscess -- hardware related #retroperitoneal fluid collection #exudative bilateral pleural effusion/empyema Initial epidural abscess I&D 7/19; cx ecoli Initial pleural fluid cx negative 7/16 and 7/26 Repeat bcx this admission negative  Suspect continguous spinal spread to the pleural space S/p bilateral chest tubes which were removed by 7/30 7/27 tte no evidence endocarditis 9/17 repeat abscess I&D cx  negative 9/17 abd fluid collection cx negative 10/04 surgical site cx negative    07/28/21 assessment patient had previously planned knee replacement, but at current pace, we need to be sure at least 6 months that his back infection is well controlled and quiescent before even considering any other hardware placement.  Would like to know if the abscess had resolved. With resolution and normalization of crp, would be comfortable to decrease the cefadroxil 500 twice a day  He'll see surgery in 08/2021 -- would like to see if mri thoracolumbar area can be done at this time  Would continue cefadroxil 1 gram po twice a day for now  10/27/21 assessment  Doing very well, ambulating much further and continued to have slight improvement in LE strength I think it is reasonable to decrease cefadroxil to 500 mg twice a day if his repeat mri spine shows no further abscess/fluid collection and crp   05/04/22 assessment Mri looks good no abscess Patient clinically doing very well Ok to decrease cefadroxil to 500 mg twice a day F/u 6 months and at that time we can decide if we want to trial off antibiotics  I don't see absolute need for blood work today  Follow-up: Return in about 6 months (around 11/02/2022).  I have spent a total of 20 minutes of face-to-face and non-face-to-face time, excluding clinical staff time, preparing to see patient, ordering tests and/or medications, and provide counseling the patient   Jabier Mutton, [place] for [place] [phone]  pager   [phone] cell 05/04/2022, 10:40 AM

## 2022-05-04 NOTE — Patient Instructions (Signed)
You are doing well  Decrease cefadroxil to 500 mg twice  a day  See me in 6 months (sooner if your back start to hurt more or you start to feel sick in any otherway)

## 2022-05-26 ENCOUNTER — Other Ambulatory Visit: Payer: Self-pay

## 2022-05-26 MED ORDER — CEFADROXIL 500 MG PO CAPS: 500.0000 mg | ORAL_CAPSULE | Freq: Two times a day (BID) | ORAL | 2 refills | Status: DC

## 2022-07-05 ENCOUNTER — Encounter
Payer: Medicare Other | Attending: Physical Medicine and Rehabilitation | Admitting: Physical Medicine and Rehabilitation

## 2022-07-05 ENCOUNTER — Encounter: Payer: Self-pay | Admitting: Physical Medicine and Rehabilitation

## 2022-07-05 VITALS — BP 136/89 | HR 98 | Ht 68.0 in | Wt 276.0 lb

## 2022-07-05 DIAGNOSIS — G062 Extradural and subdural abscess, unspecified: Secondary | ICD-10-CM | POA: Diagnosis present

## 2022-07-05 DIAGNOSIS — N319 Neuromuscular dysfunction of bladder, unspecified: Secondary | ICD-10-CM | POA: Diagnosis present

## 2022-07-05 DIAGNOSIS — R269 Unspecified abnormalities of gait and mobility: Secondary | ICD-10-CM | POA: Diagnosis present

## 2022-07-05 DIAGNOSIS — M21372 Foot drop, left foot: Secondary | ICD-10-CM

## 2022-07-05 DIAGNOSIS — G8222 Paraplegia, incomplete: Secondary | ICD-10-CM

## 2022-07-05 DIAGNOSIS — M21371 Foot drop, right foot: Secondary | ICD-10-CM | POA: Diagnosis present

## 2022-07-05 NOTE — Patient Instructions (Signed)
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  Still on PO ABX- not clear if for life? To see Dr Gale Journey in 6 months.   Wants to restart PT in [place], [place]- using rollator- will write for therapy Rx.   2. Needs to wear Ankle foot orthotics/AFO's B/L - not wearing, esp as day progresses, it increases risk of falls.  If you feel the weight/heaviness of AFOs outweigh improvement to gait, wait and until legs are stronger to use. Don't get rid of AFOs/braces.     3. Stand on toes and heels for multiple reps 10 reps- - at kitchen counter- to improve ankle/foot strength- do ~2-3x/day.   4. No spasticity seen- so will not form any at this point.    5. Has appointment with Urology- sometimes has incontinence- in 2 weeks- let me know what's decided- make sure they know you have a spinal cord injury. Probably needs a urodynamics study-    6. No neurogenic bowel Symptoms/issues.    7. F/U in 6 months- double SCI appt.

## 2022-07-05 NOTE — Progress Notes (Signed)
Subjective:    [name], male    DOB: [birth year], 72 y.o.   MRN: [id number]  HPI  Pt is a 72 yr old male with hx of epidural abscess with incomplete paraplegia status post T9-T10 laminectomy decompression followed by reconstruction T6-T10 posterior decompression fusion removal of hardware and extension of fusion to the thoracic 11 decompression of spinal cord direct T9-T11 laminectomy arthrodesis T6-T11 03/31/2021; also has post op and chronic pain; muscle spasms; and newly dx'd HTN; as well as OSA, gout; and IBS, with obesity; New dx of prediabetes? Vs DM.   F/u on paraplegia.     Things doing pretty good.    Using Rollator in house now all the time- since daughter put his w/c in storage- outside building- since December, near [place].   Stopped therapy Late September due to insurance issues.  Wants to go to therapy again.    Driving some- made 1 trip totally by himself and got rollator out, etc- did a great job.  Thinks getting close to walking some unassisted.  Has walked 100-125 yards to mailbox and back- had to take some breaks -sitting- on way back, but made it.     A whole lot of nerve feelings are back Toes are still tingling- but the leg is feeling almost normal.   Just ended up on a Statin and Metformin due to elevated CBG's.   Sometimes having bladder incontinence- ~ 1x/day usually- better in last 2 weeks.   Social Hx:  Moved pt's mother in with them in December 2023- she's 72 yrs old.  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   Pain Inventory Average Pain 1 Pain Right Now 1 My pain is dull  LOCATION OF PAIN  back, knee  BOWEL Number of stools per week: 5  Incontinent  some  BLADDER Normal    Mobility use a walker do you drive?  yes transfers alone Do you have any goals in this area?  yes  Function retired  Neuro/Psych trouble walking  Prior Studies Any  changes since last visit?  no  Physicians involved in your care Any changes since last visit?  no   Family History  Problem Relation Age of Onset   Hypertension Mother    Arthritis Mother    Stomach cancer Father    Hypertension Brother    Social History   Socioeconomic History   Marital status: Married    Spouse name: Not on file   Number of children: 2   Years of education: Not on file   Highest education level: Not on file  Occupational History   Not on file  Tobacco Use   Smoking status: Never   Smokeless tobacco: Never  Vaping Use   Vaping Use: Never used  Substance and Sexual Activity   Alcohol use: Not Currently    Comment: rare- social    Drug use: No   Sexual activity: Yes  Other Topics Concern   Not on file  Social History Narrative   Not on file   Social Determinants of Health   Financial Resource Strain: Not on file  Food Insecurity: Not on file  Transportation Needs: Not on file  Physical Activity: Not on file  Stress: Not on file  Social Connections: Not on file   Past Surgical History:  Procedure Laterality Date   ABDOMINAL EXPOSURE N/A 04/18/2020   Procedure: ABDOMINAL EXPOSURE;  Surgeon: [name], MD;  Location: [facility] OR;  Service: Vascular;  Laterality: N/A;   ANTERIOR LAT LUMBAR FUSION Left 04/18/2020   Procedure: ANTERIOR LATERAL LUMBAR FUSION LUMBAR TWO-THREE, LUMBAR THREE-FOUR;  Surgeon: [name], MD;  Location: [facility] OR;  Service: Neurosurgery;  Laterality: Left;   ANTERIOR LUMBAR FUSION N/A 04/18/2020   Procedure: Lumbar Four-Five Lumbar Five- Sacral One Anterior lumbar Interbody Fusion;  Surgeon: [name], MD;  Location[facility] OR;  Service: Neurosurgery;  Laterality: N/A;   APPLICATION OF INTRAOPERATIVE CT SCAN N/A 04/21/2020   Procedure: APPLICATION OF INTRAOPERATIVE CT SCAN;  Surgeon: [name], MD;  Location: [facility] OR;  Service: Neurosurgery;  Laterality: N/A;   BACK SURGERY     lumbar disc surgery   COLONOSCOPY W/  POLYPECTOMY     HERNIA REPAIR Left    left inguinal hernia repair   IR RADIOLOGIST EVAL & MGMT  02/24/2021   KNEE ARTHROSCOPY Right 2003    -scope for meniscus   LUMBAR WOUND DEBRIDEMENT N/A 01/13/2021   Procedure: THORACOLUMBAR WOUND DEBRIDEMENT;  Surgeon: [name], MD;  Location: [facility] OR;  Service: Neurosurgery;  Laterality: N/A;   LUMBAR WOUND DEBRIDEMENT N/A 03/14/2021   Procedure: WOUND EXPLORATION AND DEBRIDEMENT OF ABSCESS;  Surgeon: Lisbeth Renshaw, MD;  Location: [facility] OR;  Service: Neurosurgery;  Laterality: N/A;   POSTERIOR LUMBAR FUSION 4 LEVEL N/A 04/21/2020   Procedure: Thoracic ten to pelvis pedicle screw fixation with osteotomies;  Surgeon: [name], MD;  Location: [facility] OR;  Service: Neurosurgery;  Laterality: N/A;   POSTERIOR LUMBAR FUSION 4 LEVEL N/A 03/31/2021   Procedure: Thoracic six to Thoracic nine  Posterior decompression/Fusion with removal hardware thoracic ten;  Surgeon: [name], MD;  Location: [facility] OR;  Service: Neurosurgery;  Laterality: N/A;   THORACIC LAMINECTOMY FOR EPIDURAL ABSCESS N/A 03/14/2021   Procedure: THORACIC LAMINECTOMY, THORACIC NINE-THORACIC TEN;  Surgeon: Lisbeth Renshaw, MD;  Location: [facility] OR;  Service: Neurosurgery;  Laterality: N/A;   TOTAL HIP ARTHROPLASTY Left 06/15/2016   Procedure: LEFT TOTAL HIP ARTHROPLASTY ANTERIOR APPROACH;  Surgeon: [name], MD;  Location: [facility] ORS;  Service: Orthopedics;  Laterality: Left;   Past Medical History:  Diagnosis Date   Arthritis    arthritis-bilateral knees, left Hip.   Degenerative lumbar spinal stenosis    EKG abnormality    06-08-16- being evaluated by cardiologist  in [place]   Idiopathic scoliosis of lumbar region    Joint stiffness    Knee pain    Leg swelling    Low back pain    Achy, shooting pain.  Hurts to Walk or stand up straight.   Lumbar radiculopathy    Scoliosis concern    Sleep apnea    Bipap use nightly 25/19 -3l/m oxygen   Spondylolisthesis of lumbar region     Transfusion history    [place] ,[place] after bleeding post colon polyp removal and colonscopy procedure   BP 136/89   Pulse 98   Ht 5\' 8"  (1.727 m)   Wt 276 lb (125.2 kg)   SpO2 97%   BMI 41.97 kg/m   Opioid Risk Score:   Fall Risk Score:  `1  Depression screen [facility] 2/9     07/05/2022   10:19 AM 05/04/2022   10:16 AM 12/04/2021   11:03 AM 10/27/2021   10:23 AM 08/26/2021   10:49 AM 06/02/2021   10:19 AM 05/20/2021   12:35 PM  Depression screen [facility] 2/9  Decreased Interest 0 0 0 0 0 0 0  Down, Depressed, Hopeless 0 0 0 0 0 0 0  PHQ - 2 Score 0 0 0 0 0 0 0  Altered sleeping      0 0  Tired, decreased energy      0 0  Change in appetite      0 0  Feeling bad or failure about yourself       0 0  Trouble concentrating      0 0  Moving slowly or fidgety/restless      0 0  Suicidal thoughts      0 0  PHQ-9 Score      0 0     Review of Systems  Musculoskeletal:  Positive for back pain and gait problem.       B/L knee pain  All other systems reviewed and are negative.     Objective:   Physical Exam  Awake, alert, appropriate; accompanied by wife, NAD Using rollator MS; RLE- HF 4+/5; KE 5-/5; DF 2/5; PF 4+/5 LLE- HF 2+/5; KE 5-/5; DF 2/5; PF 4+/5  Severe crepitus B/L knees  Neuro: Decreased to light touch in L3- L5 B/L; and significantly decreased at S1/S2 B/ - not absent No clonus; no increased tone- B/L   Extremities: 1-2+ LE edema to mid calf B/L nonpitting.    Gait: With rollator- B/L foot slap- and hyperextension on L knee to compensate for L buttock as well as L hip weakness     Assessment & Plan:   Pt is a 72 yr old male with hx of epidural abscess with incomplete paraplegia- ASIA D  status post T9-T10 laminectomy decompression followed by reconstruction T6-T10 posterior decompression fusion removal of hardware and extension of fusion to the thoracic 11 decompression of spinal cord direct T9-T11 laminectomy arthrodesis T6-T11 03/31/2021; also has post op and chronic  pain; muscle spasms; and newly dx'd HTN; as well as OSA, gout; and IBS, with obesity; F/u on paraplegia.  Still on PO ABX- not clear if for life? To see Dr Gale Journey in 6 months.   Wants to restart PT in [place], [place]- using rollator- will write for therapy Rx.   2. Needs to wear Ankle foot orthotics/AFO's B/L - not wearing, esp as day progresses, it increases risk of falls.  If you feel the weight/heaviness of AFOs outweigh improvement to gait, wait and until legs are stronger to use. Don't get rid of AFOs/braces.     3. Stand on toes and heels for multiple reps 10 reps- - at kitchen counter- to improve ankle/foot strength- do ~2-3x/day.   4. No spasticity seen- so will not form any at this point.    5. Has appointment with Urology- sometimes has incontinence- in 2 weeks- let me know what's decided- make sure they know you have a spinal cord injury. Probably needs a urodynamics study-    6. No neurogenic bowel Symptoms/issues.    7. F/U in 6 months- double SCI appt.    I spent a total of   34 minutes on total care today- >50% coordination of care- due to education on AFOs, more PT, Urology, and exercises for strengthening.

## 2022-08-07 ENCOUNTER — Other Ambulatory Visit: Payer: Self-pay | Admitting: Physical Medicine and Rehabilitation

## 2022-08-20 ENCOUNTER — Other Ambulatory Visit: Payer: Self-pay | Admitting: Internal Medicine

## 2022-08-20 NOTE — Telephone Encounter (Signed)
Okay to refill? 

## 2022-08-25 ENCOUNTER — Other Ambulatory Visit (HOSPITAL_COMMUNITY): Payer: Self-pay

## 2022-11-02 ENCOUNTER — Ambulatory Visit: Payer: Medicare Other | Admitting: Internal Medicine

## 2022-11-04 ENCOUNTER — Ambulatory Visit: Payer: Medicare Other | Admitting: Internal Medicine

## 2022-11-05 ENCOUNTER — Ambulatory Visit (INDEPENDENT_AMBULATORY_CARE_PROVIDER_SITE_OTHER): Payer: Medicare Other | Admitting: Internal Medicine

## 2022-11-05 ENCOUNTER — Encounter: Payer: Self-pay | Admitting: Internal Medicine

## 2022-11-05 ENCOUNTER — Other Ambulatory Visit: Payer: Self-pay

## 2022-11-05 VITALS — BP 146/83 | HR 70 | Resp 16 | Ht 68.0 in | Wt 294.0 lb

## 2022-11-05 DIAGNOSIS — M4625 Osteomyelitis of vertebra, thoracolumbar region: Secondary | ICD-10-CM | POA: Diagnosis not present

## 2022-11-05 DIAGNOSIS — T847XXD Infection and inflammatory reaction due to other internal orthopedic prosthetic devices, implants and grafts, subsequent encounter: Secondary | ICD-10-CM

## 2022-11-05 DIAGNOSIS — M4620 Osteomyelitis of vertebra, site unspecified: Secondary | ICD-10-CM

## 2022-11-05 NOTE — Progress Notes (Signed)
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 coli) (HCC) 02/01/2021   Empyema (HCC)    Anasarca 01/20/2021   OSA (obstructive sleep apnea) 01/20/2021   Obesity, Class III, BMI 40-49.9 (morbid obesity) (HCC) 01/20/2021   Pleural effusion    Vertebral osteomyelitis (HCC)    Hardware complicating wound infection (HCC)    Abscess of back 01/11/2021   Lumbar scoliosis 04/18/2020   Back pain 12/25/2019   S/P left THA, AA 06/15/2016      Subjective:    Patient ID: Unk Lightning, male    DOB: 1950-08-29, 72 y.o.   MRN: [id number]  CC: f/u vertebral thoracic osteomyelitis/empyema/intra-peritoneal abscess  HPI:  BLAKELEE FULCO is a 72 y.o. male prior extensive thoracolumbar hardware, complicated by thoracic vertebral om/epidural abscess here for hospital discharge f/u  Patient has had complex course with multiple admissions for the same since 12/2020. he had I&D 7/19 with cx showing ecoli, then discharged but readmitted 7/22 with dyspnea found to have empyema (chest tubes bilateral but cx negative -- lights exudative criteria met however). During the second  admission also ct scan finding of left retroperitoneal fluid collection s/p IR drain placement 8/02, and right knee infusion s/p aspiration 8/03 without evidence of infection.  Other previous w/u include a negative tte. And both admissions blood culture negative.  He continues on oral abx cephalexin and was most recently readmitted 9/17-10/07 and discharged to inpatient rehab until 11/11. He had worsening pain LE and mri repeat 9/16 showed worsening thoracic epidural collection. He underwent I&D on 9/17 with more hardware stabilization placed on 10/04. Operative culture was negative; bcx negative as well. He was discharged on amox-->cefadroxil. Of note he had repeat abd/pelv/chest ct as well which showed increased previous abd fluid collection -- pigtail catheter placed -- cx negative. All catheters removed prior to moving to inpatient rehab    --------------- 06/02/2021 id clinic f/u Not taking morphine since hospital discharge On cymbalta and taking tylenol Had sensation deficit prior to admission 02/2021 but that is now coming back Strength still very weak -- on wheel chair, but strength improving. Can't stand yet Patient has in house pt/ot No diarrhea/rash/n/v Wife reports some redness from being in wheel chair No chest pain/cough/sob No abd pain  07/28/21 id clinic f/u Doing pt/ot -- walking about 20 ft with fww and some assist No n/v/diarrhea/rash No fever/chill Pain well controlled -- cymbalta, voltaren. No narcotics products Wears back brace To see nsg again 08/2021 and will gradually be off the brace   10/27/21 id clinic f/u Patient doing  well He will be going to [place] in couple weeks for his son's wedding 5/20 Tolerating cefadroxil 1 gram twice a day.  His nsg is planning a repeat spine mri soon  No n/v/diarrhea No back pain Able to PT walks 250 ft with fww.   04/2022 id clinic f/u Patient's recent mri no abscess Doing well on cefadroxil 1 gram twice a day No  n/v/diarrhea He has been gaining some weight Happy with where he is Walks with fww   11/05/2022 id clinic f/u Walks with fww No complain in the back Taking cefadroxil No n/v/diarrhea   No Known Allergies    Outpatient Medications Prior to Visit  Medication Sig Dispense Refill   amLODipine (NORVASC) 10 MG tablet Take 1 tablet (10 mg total) by mouth daily. 30 tablet 0   carvedilol (COREG) 3.125 MG tablet Take 1 tablet (3.125 mg total) by mouth 2 (two) times daily with a meal. 60 tablet 0   cefadroxil (DURICEF) 500 MG capsule Take 1 capsule by mouth twice daily 60 capsule 2   cholecalciferol (VITAMIN D) 25 MCG (1000 UNIT) tablet Take by mouth.     diclofenac (VOLTAREN) 75 MG EC tablet Take by mouth.     DULoxetine (CYMBALTA) 60 MG capsule TAKE 1 CAPSULE (60 MG TOTAL) BY MOUTH AT BEDTIME. 90 capsule 1   metFORMIN (GLUCOPHAGE-XR) 750 MG 24 hr tablet Take 750 mg by mouth 2 (two) times daily.     pravastatin (PRAVACHOL) 20 MG tablet Take 20 mg by mouth daily.     terbinafine (LAMISIL) 250 MG tablet Take 250 mg by mouth daily.     No facility-administered medications prior to visit.     Social History   Socioeconomic History   Marital status: Married    Spouse name: Not on file   Number of children: 2   Years of education: Not on file   Highest education level: Not on file  Occupational History   Not on file  Tobacco Use   Smoking status: Never   Smokeless tobacco: Never  Vaping Use   Vaping Use: Never used  Substance and Sexual Activity   Alcohol use: Not Currently    Comment: rare- social    Drug use: No   Sexual activity: Yes  Other Topics Concern   Not on file  Social History Narrative   Not on file   Social Determinants of Health   Financial Resource Strain: Not on file  Food Insecurity: Not on file  Transportation Needs: Not on file  Physical Activity: Not on file  Stress: Not on file  Social Connections: Not on file  Intimate Partner Violence: Not on  file      Review of Systems    All other ros negative  Objective:    BP (!) 146/83   Pulse 70   Resp 16   Ht 5\' 8"  (1.727 m)   Wt 294 lb (133.4 kg)   SpO2 95%   BMI 44.70 kg/m  Nursing note and vital signs reviewed.  Physical Exam    General/constitutional: no distress, pleasant HEENT: Normocephalic, PER, Conj Clear, EOMI, Oropharynx clear Neck supple CV: rrr no mrg Lungs: clear to auscultation, normal respiratory effort Abd: Soft, Nontender Ext: 1+ bilateral LE edema Skin: No Rash Neuro: nonfocal MSK: no peripheral joint swelling/tenderness/warmth; back spines nontender    Labs: Lab Results  Component Value Date   WBC 7.6 10/27/2021   HGB 14.6 10/27/2021   HCT 43.2 10/27/2021   MCV  88.7 10/27/2021   PLT 296 10/27/2021   Last metabolic panel Lab Results  Component Value Date   GLUCOSE 93 10/27/2021   NA 140 10/27/2021   K 4.3 10/27/2021   CL 103 10/27/2021   CO2 30 10/27/2021   BUN 31 (H) 10/27/2021   CREATININE 0.72 10/27/2021   GFRNONAA >60 04/27/2021   GFRAA >60 06/16/2016   CALCIUM 9.2 10/27/2021   PHOS 3.8 03/19/2021   PROT 6.7 10/27/2021   ALBUMIN 2.4 (L) 04/06/2021   BILITOT 0.4 10/27/2021   ALKPHOS 68 04/06/2021   AST 21 10/27/2021   ALT 22 10/27/2021   ANIONGAP 9 04/27/2021    Crp: 10/27/21   3 (<8) 07/28/21   2.4 (<8) 05/2021    5.8 (<8) 10/31   0.5 (<1) 10/06   8.1 (<1) 9/20   15.1 (<1) 8/19   10.5 (<1) 7/26   36    (<1)  Micro: 9/17 and 10/04 operative back wound cx negative 9/17 bcx negative 9/17 abd fluid collection cx negative  Serology:  Imaging: 8/20 abdpelv ct 1. Left pelvic drain in the left iliac fossa has effectively drained nearly all of the previously noted left-sided pelvic fluid collection, as discussed above. 2. Persistent bilateral pleural effusions. On the left, this is simple in appearance on today's examination. On the right, there is persistent loculation and persistent gas and  pleural thickening/enhancement, indicative of residual empyema. 3. Aortic atherosclerosis. 4. Additional incidental findings, as above.  8/03 mri lumbar thoracic spine 1. 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 Circumferential dural thickening at the T10 level again effaces the thecal sac with mass effect on the spinal cord. 2. New abnormal disc signal at T9-10 and unchanged abnormal signal T10-11, consistent with discitis-osteomyelitis. 3. No lumbar spinal canal stenosis. 4. Loculated bilateral pleural collections.  9/24 mri thoracic/lumbar spine 1. Postoperative changes from interval decompressive laminectomy at T9 and T10. Satisfactory appearance of the laminectomy site, with improved stenosis at these levels. However, there is persistent severe spinal stenosis just below the operative site at the level of T10-11 with moderate cord flattening, slightly worsened as compared to 03/14/2021. No definite or convincing cord signal changes at this time. 2. 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 Associated ventral epidural abscess extending from T8 through T11 is overall slightly decreased in size as compared to previous. 3. Persistent paravertebral inflammation and phlegmon, with persistent irregular loculated bilateral pleural collections/empyema. 4. No other new or progressive findings elsewhere within the thoracolumbar spine.  9/24 abd pelv ct with contrast 1. Left pelvic collection has been decompressed with a percutaneous pigtail catheter. Adjacent inflammatory changes have decreased from the prior CT. 2. No new or acute abnormalities within the abdomen or pelvis. 3. Changes of discitis/osteomyelitis of the lower thoracic spine are similar to the prior CT.  10/20 thoracic spine mri 1. Interval superior extension of the thoracolumbar fusion to include T6 through the  sacrum. 2. The previously demonstrated changes of diskitis and osteomyelitis at T9-10 and T10-11 have substantially improved, and there is no evidence of residual or recurrent epidural or paraspinal fluid collection. There are some residual marrow changes within the T10 vertebral body, but no progressive bone destruction. 3. No new osseous abnormalities. 4. Possible subpleural right lower lobe pulmonary nodule. Further evaluation with chest CT recommended.   Assessment & Plan:   Problem List Items Addressed This Visit       Musculoskeletal and  Integument   Vertebral osteomyelitis (HCC) - Primary   Relevant Orders   COMPLETE METABOLIC PANEL WITH GFR   C-reactive protein   CBC     Other   Hardware complicating wound infection (HCC)   Relevant Orders   COMPLETE METABOLIC PANEL WITH GFR  No orders of the defined types were placed in this encounter.       Abx: 11/14-c cefadroxil; 11/7 transitioned 500 mg bid  10/07-11/13 amoxicillin 7/30-10/07 cefazolin  7/26-30 cefepime 7/26-7/28 [facility] Home iv cefazolin                                                          Assessment/Plan 72 yo male previous thoracolumbar hardware/back surgery 03/2020 complicated by thoracic vertebral om/epidural abscess s/p recent I&D 7/19 with cx ecoli (bcx negative at that time), readmitted a few days after discharged on 7/22 for fluid overload and bilateral pleural effusion and fever, s/p bilateral chest tube placement with effusion appearing exudative. Course also with ct imaging showing left retroperitoneal fluid collection s/p IR drain placement 8/02 with negative cx, and right knee effusion s/p aspiration 8/3 without evidence of infection either.  He had a repeat admission 03/14/21 for worsening pain/numbness in legs with imaging showing worsening epidural process and abd fluid collection, while on abx. He underwent I&D 9/17 and more hardware for stabilization 10/04 -- operative cx negative. Drain in  abd abscess placed 9/17 but since removed as repeat abd ct 9/27 showed abscess resolved -- cx negative as well      #vertebral OM/epidural abscess -- hardware related #retroperitoneal fluid collection #exudative bilateral pleural effusion/empyema Initial epidural abscess I&D 7/19; cx ecoli Initial pleural fluid cx negative 7/16 and 7/26 Repeat bcx this admission negative  Suspect continguous spinal spread to the pleural space S/p bilateral chest tubes which were removed by 7/30 7/27 tte no evidence endocarditis 9/17 repeat abscess I&D cx negative 9/17 abd fluid collection cx negative 10/04 surgical site cx negative    07/28/21 assessment patient had previously planned knee replacement, but at current pace, we need to be sure at least 6 months that his back infection is well controlled and quiescent before even considering any other hardware placement.  Would like to know if the abscess had resolved. With resolution and normalization of crp, would be comfortable to decrease the cefadroxil 500 twice a day  He'll see surgery in 08/2021 -- would like to see if mri thoracolumbar area can be done at this time  Would continue cefadroxil 1 gram po twice a day for now  10/27/21 assessment  Doing very well, ambulating much further and continued to have slight improvement in LE strength I think it is reasonable to decrease cefadroxil to 500 mg twice a day if his repeat mri spine shows no further abscess/fluid collection and crp   05/04/22 assessment Mri looks good no abscess Patient clinically doing very well Ok to decrease cefadroxil to 500 mg twice a day F/u 6 months and at that time we can decide if we want to trial off antibiotics  I don't see absolute need for blood work today  11/05/22 assessment Patient continues to do well on abx suppression We decide to trial off today  Baseline labs Repeat evaluations in 4-6 weeks and plan to follow for the next 3-6 months to make  sure no  relapse  Follow-up: Return in about 6 weeks (around 12/17/2022).    Raymondo Band, MD [facility] [phone]  pager   [phone] cell 11/05/2022, 10:43 AM

## 2022-11-05 NOTE — Patient Instructions (Signed)
Let's stop antibiotics today   Labs baseline; repeat evaluation schedule as follow (4-6 weeks, another 4-6 weeks, then 3 months from then). If all follow up look good, then will let you go from clinic

## 2022-11-06 LAB — CBC
HCT: 41.2 % (ref 38.5–50.0)
Hemoglobin: 13.8 g/dL (ref 13.2–17.1)
MCH: 29.7 pg (ref 27.0–33.0)
MCHC: 33.5 g/dL (ref 32.0–36.0)
MCV: 88.8 fL (ref 80.0–100.0)
MPV: 10 fL (ref 7.5–12.5)
Platelets: 310 10*3/uL (ref 140–400)
RBC: 4.64 10*6/uL (ref 4.20–5.80)
RDW: 13.8 % (ref 11.0–15.0)
WBC: 9 10*3/uL (ref 3.8–10.8)

## 2022-11-06 LAB — C-REACTIVE PROTEIN: CRP: 5.7 mg/L (ref <8.0)

## 2022-11-06 LAB — COMPLETE METABOLIC PANEL WITH GFR
AG Ratio: 1.7 (calc) (ref 1.0–2.5)
ALT: 20 U/L (ref 9–46)
AST: 20 U/L (ref 10–35)
Albumin: 4.3 g/dL (ref 3.6–5.1)
Alkaline phosphatase (APISO): 63 U/L (ref 35–144)
BUN: 25 mg/dL (ref 7–25)
CO2: 28 mmol/L (ref 20–32)
Calcium: 9.9 mg/dL (ref 8.6–10.3)
Chloride: 104 mmol/L (ref 98–110)
Creat: 0.91 mg/dL (ref 0.70–1.28)
Globulin: 2.5 g/dL (calc) (ref 1.9–3.7)
Glucose, Bld: 131 mg/dL — ABNORMAL HIGH (ref 65–99)
Potassium: 4.9 mmol/L (ref 3.5–5.3)
Sodium: 141 mmol/L (ref 135–146)
Total Bilirubin: 0.4 mg/dL (ref 0.2–1.2)
Total Protein: 6.8 g/dL (ref 6.1–8.1)
eGFR: 90 mL/min/{1.73_m2} (ref >=60)

## 2022-11-17 ENCOUNTER — Telehealth: Payer: Self-pay

## 2022-11-17 NOTE — Telephone Encounter (Signed)
Patient calling complaining of back pain x 4 days. Patient states he was advised to let you know if any changes after stopping abx.  Please advise. Glendene Wyer T Pricilla Loveless

## 2022-11-17 NOTE — Telephone Encounter (Signed)
Patient is willing to get labs done first before coming in sooner than his scheduled appointment on 12/09/22. Patient advised to continue to monitor the pain I have reached out to patient's pcp office [facility] [phone]) to ask if the labs can be done at their office and we fax the orders to them. No answer and a message was left for them to call our office back. If labs can not be done their we will send orders to [facility] in [place].

## 2022-11-17 NOTE — Telephone Encounter (Signed)
Lisa with [facility] returned call and stated orders could be faxed to their office and patient can have them drawn there. Lab orders faxed to  [phone]) I have advised the patient to call their office to scheduled to get labs done and let us know after he has done this, so we can be sure to get the results.  Saahir Prude T Pricilla Loveless

## 2022-11-18 ENCOUNTER — Other Ambulatory Visit: Payer: Medicare Other

## 2022-11-18 NOTE — Telephone Encounter (Signed)
Spoke with patient, relayed that if he's having fever, chills, or worsening pain that he should go to the emergency department. He said he was having "maybe a tiny bit" of chills earlier, but that he is not now.   He's asking if he should restart his antibiotics. Spoke with Dr. Renold Don who is fine with him starting antibiotics again. Relayed this to patient.   He verbalized understanding that if he develops fever, chills, or worsening pain that he should go to the ED.  Sandie Ano, RN

## 2022-11-18 NOTE — Telephone Encounter (Signed)
Patient called wanting to know if lab results had been received and whether or not he should go to the ED. Lab results given to Dr. Renold Don to review.   Sandie Ano, RN

## 2022-11-20 ENCOUNTER — Emergency Department (HOSPITAL_COMMUNITY): Payer: Medicare Other

## 2022-11-20 ENCOUNTER — Encounter (HOSPITAL_COMMUNITY): Payer: Self-pay | Admitting: Pharmacy Technician

## 2022-11-20 ENCOUNTER — Other Ambulatory Visit: Payer: Self-pay

## 2022-11-20 ENCOUNTER — Inpatient Hospital Stay (HOSPITAL_COMMUNITY)
Admission: EM | Admit: 2022-11-20 | Discharge: 2022-11-24 | DRG: 559 | Disposition: A | Discharge status: Home Health | Payer: Medicare Other | Attending: Internal Medicine | Admitting: Internal Medicine

## 2022-11-20 DIAGNOSIS — I10 Essential (primary) hypertension: Secondary | ICD-10-CM | POA: Diagnosis present

## 2022-11-20 DIAGNOSIS — M4624 Osteomyelitis of vertebra, thoracic region: Secondary | ICD-10-CM | POA: Diagnosis present

## 2022-11-20 DIAGNOSIS — Z7984 Long term (current) use of oral hypoglycemic drugs: Secondary | ICD-10-CM

## 2022-11-20 DIAGNOSIS — Z8661 Personal history of infections of the central nervous system: Secondary | ICD-10-CM

## 2022-11-20 DIAGNOSIS — M4620 Osteomyelitis of vertebra, site unspecified: Secondary | ICD-10-CM | POA: Diagnosis present

## 2022-11-20 DIAGNOSIS — E1169 Type 2 diabetes mellitus with other specified complication: Secondary | ICD-10-CM | POA: Diagnosis present

## 2022-11-20 DIAGNOSIS — Z79899 Other long term (current) drug therapy: Secondary | ICD-10-CM

## 2022-11-20 DIAGNOSIS — A419 Sepsis, unspecified organism: Principal | ICD-10-CM

## 2022-11-20 DIAGNOSIS — Z86010 Personal history of colonic polyps: Secondary | ICD-10-CM

## 2022-11-20 DIAGNOSIS — M4126 Other idiopathic scoliosis, lumbar region: Secondary | ICD-10-CM | POA: Diagnosis present

## 2022-11-20 DIAGNOSIS — E785 Hyperlipidemia, unspecified: Secondary | ICD-10-CM | POA: Diagnosis present

## 2022-11-20 DIAGNOSIS — M4644 Discitis, unspecified, thoracic region: Secondary | ICD-10-CM | POA: Diagnosis present

## 2022-11-20 DIAGNOSIS — Z8261 Family history of arthritis: Secondary | ICD-10-CM

## 2022-11-20 DIAGNOSIS — E66813 Obesity, class 3: Secondary | ICD-10-CM | POA: Diagnosis present

## 2022-11-20 DIAGNOSIS — Z1152 Encounter for screening for COVID-19: Secondary | ICD-10-CM

## 2022-11-20 DIAGNOSIS — R0902 Hypoxemia: Secondary | ICD-10-CM | POA: Diagnosis not present

## 2022-11-20 DIAGNOSIS — Z96642 Presence of left artificial hip joint: Secondary | ICD-10-CM | POA: Diagnosis present

## 2022-11-20 DIAGNOSIS — Z6841 Body Mass Index (BMI) 40.0 and over, adult: Secondary | ICD-10-CM

## 2022-11-20 DIAGNOSIS — R7881 Bacteremia: Secondary | ICD-10-CM | POA: Diagnosis not present

## 2022-11-20 DIAGNOSIS — Z792 Long term (current) use of antibiotics: Secondary | ICD-10-CM

## 2022-11-20 DIAGNOSIS — G062 Extradural and subdural abscess, unspecified: Secondary | ICD-10-CM | POA: Diagnosis present

## 2022-11-20 DIAGNOSIS — T8459XA Infection and inflammatory reaction due to other internal joint prosthesis, initial encounter: Secondary | ICD-10-CM | POA: Diagnosis not present

## 2022-11-20 DIAGNOSIS — N179 Acute kidney failure, unspecified: Secondary | ICD-10-CM | POA: Diagnosis present

## 2022-11-20 DIAGNOSIS — Z981 Arthrodesis status: Secondary | ICD-10-CM

## 2022-11-20 DIAGNOSIS — G8222 Paraplegia, incomplete: Secondary | ICD-10-CM | POA: Diagnosis present

## 2022-11-20 DIAGNOSIS — M50223 Other cervical disc displacement at C6-C7 level: Secondary | ICD-10-CM | POA: Diagnosis present

## 2022-11-20 DIAGNOSIS — E46 Unspecified protein-calorie malnutrition: Secondary | ICD-10-CM | POA: Diagnosis present

## 2022-11-20 DIAGNOSIS — Z8249 Family history of ischemic heart disease and other diseases of the circulatory system: Secondary | ICD-10-CM

## 2022-11-20 DIAGNOSIS — B9620 Unspecified Escherichia coli [E. coli] as the cause of diseases classified elsewhere: Secondary | ICD-10-CM | POA: Diagnosis present

## 2022-11-20 DIAGNOSIS — J90 Pleural effusion, not elsewhere classified: Secondary | ICD-10-CM | POA: Diagnosis present

## 2022-11-20 DIAGNOSIS — K5900 Constipation, unspecified: Secondary | ICD-10-CM | POA: Diagnosis present

## 2022-11-20 DIAGNOSIS — E8809 Other disorders of plasma-protein metabolism, not elsewhere classified: Secondary | ICD-10-CM | POA: Diagnosis present

## 2022-11-20 DIAGNOSIS — Y828 Other medical devices associated with adverse incidents: Secondary | ICD-10-CM | POA: Diagnosis present

## 2022-11-20 DIAGNOSIS — Z800 Family history of malignant neoplasm of digestive organs: Secondary | ICD-10-CM

## 2022-11-20 DIAGNOSIS — J9811 Atelectasis: Secondary | ICD-10-CM | POA: Diagnosis present

## 2022-11-20 LAB — COMPREHENSIVE METABOLIC PANEL
ALT: 28 U/L (ref 0–44)
AST: 30 U/L (ref 15–41)
Albumin: 2.8 g/dL — ABNORMAL LOW (ref 3.5–5.0)
Alkaline Phosphatase: 67 U/L (ref 38–126)
Anion gap: 11 (ref 5–15)
BUN: 21 mg/dL (ref 8–23)
CO2: 24 mmol/L (ref 22–32)
Calcium: 8.2 mg/dL — ABNORMAL LOW (ref 8.9–10.3)
Chloride: 96 mmol/L — ABNORMAL LOW (ref 98–111)
Creatinine, Ser: 1.31 mg/dL — ABNORMAL HIGH (ref 0.61–1.24)
GFR, Estimated: 58 mL/min — ABNORMAL LOW (ref >60)
Glucose, Bld: 228 mg/dL — ABNORMAL HIGH (ref 70–99)
Potassium: 3.6 mmol/L (ref 3.5–5.1)
Sodium: 131 mmol/L — ABNORMAL LOW (ref 135–145)
Total Bilirubin: 0.6 mg/dL (ref 0.3–1.2)
Total Protein: 6.5 g/dL (ref 6.5–8.1)

## 2022-11-20 LAB — CBC WITH DIFFERENTIAL/PLATELET
Abs Immature Granulocytes: 0.06 10*3/uL (ref 0.00–0.07)
Basophils Absolute: 0 10*3/uL (ref 0.0–0.1)
Basophils Relative: 0 %
Eosinophils Absolute: 0 10*3/uL (ref 0.0–0.5)
Eosinophils Relative: 0 %
HCT: 39 % (ref 39.0–52.0)
Hemoglobin: 12.4 g/dL — ABNORMAL LOW (ref 13.0–17.0)
Immature Granulocytes: 1 %
Lymphocytes Relative: 4 %
Lymphs Abs: 0.4 10*3/uL — ABNORMAL LOW (ref 0.7–4.0)
MCH: 29.2 pg (ref 26.0–34.0)
MCHC: 31.8 g/dL (ref 30.0–36.0)
MCV: 91.8 fL (ref 80.0–100.0)
Monocytes Absolute: 0.9 10*3/uL (ref 0.1–1.0)
Monocytes Relative: 9 %
Neutro Abs: 9.6 10*3/uL — ABNORMAL HIGH (ref 1.7–7.7)
Neutrophils Relative %: 86 %
Platelets: 215 10*3/uL (ref 150–400)
RBC: 4.25 MIL/uL (ref 4.22–5.81)
RDW: 14.1 % (ref 11.5–15.5)
WBC: 11.1 10*3/uL — ABNORMAL HIGH (ref 4.0–10.5)
nRBC: 0 % (ref 0.0–0.2)

## 2022-11-20 LAB — RESP PANEL BY RT-PCR (RSV, FLU A&B, COVID)  RVPGX2
Influenza A by PCR: NEGATIVE
Influenza B by PCR: NEGATIVE
Resp Syncytial Virus by PCR: NEGATIVE
SARS Coronavirus 2 by RT PCR: NEGATIVE

## 2022-11-20 LAB — C-REACTIVE PROTEIN: CRP: 36.4 mg/dL — ABNORMAL HIGH (ref <1.0)

## 2022-11-20 LAB — GLUCOSE, CAPILLARY
Glucose-Capillary: 163 mg/dL — ABNORMAL HIGH (ref 70–99)
Glucose-Capillary: 175 mg/dL — ABNORMAL HIGH (ref 70–99)

## 2022-11-20 LAB — APTT: aPTT: 23 seconds — ABNORMAL LOW (ref 24–36)

## 2022-11-20 LAB — PROTIME-INR
INR: 1.2 (ref 0.8–1.2)
Prothrombin Time: 15.3 seconds — ABNORMAL HIGH (ref 11.4–15.2)

## 2022-11-20 LAB — LACTIC ACID, PLASMA
Lactic Acid, Venous: 2 mmol/L (ref 0.5–1.9)
Lactic Acid, Venous: 1.9 mmol/L (ref 0.5–1.9)

## 2022-11-20 LAB — SEDIMENTATION RATE: Sed Rate: 60 mm/hr — ABNORMAL HIGH (ref 0–16)

## 2022-11-20 MED ORDER — LACTATED RINGERS IV BOLUS (SEPSIS)
1000.0000 mL | Freq: Once | INTRAVENOUS | Status: AC
  Administered 2022-11-20: 1000 mL via INTRAVENOUS

## 2022-11-20 MED ORDER — SODIUM CHLORIDE 0.9 % IV SOLN
2.0000 g | Freq: Once | INTRAVENOUS | Status: AC
  Administered 2022-11-20: 2 g via INTRAVENOUS
  Filled 2022-11-20: qty 12.5

## 2022-11-20 MED ORDER — VANCOMYCIN HCL 1500 MG/300ML IV SOLN: 1500.0000 mg | INTRAVENOUS | Status: DC

## 2022-11-20 MED ORDER — LACTATED RINGERS IV BOLUS (SEPSIS): 250.0000 mL | Freq: Once | INTRAVENOUS | Status: DC

## 2022-11-20 MED ORDER — HYDROMORPHONE HCL 1 MG/ML IJ SOLN
0.5000 mg | INTRAMUSCULAR | Status: DC | PRN
  Administered 2022-11-21 – 2022-11-22 (×4): 0.5 mg via INTRAVENOUS
  Filled 2022-11-20 (×4): qty 0.5

## 2022-11-20 MED ORDER — CARVEDILOL 3.125 MG PO TABS
3.1250 mg | ORAL_TABLET | Freq: Two times a day (BID) | ORAL | Status: DC
  Administered 2022-11-21 – 2022-11-24 (×7): 3.125 mg via ORAL
  Filled 2022-11-20 (×7): qty 1

## 2022-11-20 MED ORDER — SENNOSIDES-DOCUSATE SODIUM 8.6-50 MG PO TABS
1.0000 | ORAL_TABLET | Freq: Every day | ORAL | Status: DC
  Administered 2022-11-20 – 2022-11-21 (×2): 1 via ORAL
  Filled 2022-11-20 (×2): qty 1

## 2022-11-20 MED ORDER — ENOXAPARIN SODIUM 60 MG/0.6ML IJ SOSY
60.0000 mg | PREFILLED_SYRINGE | INTRAMUSCULAR | Status: DC
  Administered 2022-11-20 – 2022-11-23 (×4): 60 mg via SUBCUTANEOUS
  Filled 2022-11-20 (×4): qty 0.6

## 2022-11-20 MED ORDER — POLYETHYLENE GLYCOL 3350 17 G PO PACK
17.0000 g | PACK | Freq: Every day | ORAL | Status: DC
  Administered 2022-11-20 – 2022-11-24 (×5): 17 g via ORAL
  Filled 2022-11-20 (×5): qty 1

## 2022-11-20 MED ORDER — LACTATED RINGERS IV SOLN: INTRAVENOUS | Status: DC

## 2022-11-20 MED ORDER — PRAVASTATIN SODIUM 40 MG PO TABS
20.0000 mg | ORAL_TABLET | Freq: Every day | ORAL | Status: DC
  Administered 2022-11-20 – 2022-11-24 (×5): 20 mg via ORAL
  Filled 2022-11-20 (×5): qty 1

## 2022-11-20 MED ORDER — AMLODIPINE BESYLATE 10 MG PO TABS
10.0000 mg | ORAL_TABLET | Freq: Every day | ORAL | Status: DC
  Administered 2022-11-21 – 2022-11-24 (×4): 10 mg via ORAL
  Filled 2022-11-20 (×5): qty 1

## 2022-11-20 MED ORDER — INSULIN ASPART 100 UNIT/ML IJ SOLN
0.0000 [IU] | INTRAMUSCULAR | Status: DC
  Administered 2022-11-21 (×3): 3 [IU] via SUBCUTANEOUS
  Administered 2022-11-21: 2 [IU] via SUBCUTANEOUS
  Administered 2022-11-22 (×3): 3 [IU] via SUBCUTANEOUS
  Administered 2022-11-22: 2 [IU] via SUBCUTANEOUS
  Administered 2022-11-22: 8 [IU] via SUBCUTANEOUS
  Administered 2022-11-22 – 2022-11-23 (×2): 3 [IU] via SUBCUTANEOUS
  Administered 2022-11-23: 2 [IU] via SUBCUTANEOUS
  Administered 2022-11-23 – 2022-11-24 (×5): 3 [IU] via SUBCUTANEOUS
  Administered 2022-11-24: 5 [IU] via SUBCUTANEOUS
  Administered 2022-11-24 (×2): 3 [IU] via SUBCUTANEOUS

## 2022-11-20 MED ORDER — SODIUM CHLORIDE 0.9 % IV SOLN
2.0000 g | Freq: Three times a day (TID) | INTRAVENOUS | Status: DC
  Administered 2022-11-21 – 2022-11-22 (×5): 2 g via INTRAVENOUS
  Filled 2022-11-20 (×5): qty 12.5

## 2022-11-20 MED ORDER — FENTANYL CITRATE PF 50 MCG/ML IJ SOSY: 50.0000 ug | PREFILLED_SYRINGE | Freq: Once | INTRAMUSCULAR | Status: DC

## 2022-11-20 MED ORDER — VANCOMYCIN HCL 2000 MG/400ML IV SOLN
2000.0000 mg | Freq: Once | INTRAVENOUS | Status: AC
  Administered 2022-11-20: 2000 mg via INTRAVENOUS
  Filled 2022-11-20: qty 400

## 2022-11-20 MED ORDER — DULOXETINE HCL 60 MG PO CPEP
60.0000 mg | ORAL_CAPSULE | Freq: Every day | ORAL | Status: DC
  Administered 2022-11-20 – 2022-11-23 (×4): 60 mg via ORAL
  Filled 2022-11-20 (×4): qty 1

## 2022-11-20 NOTE — ED Notes (Signed)
ED TO INPATIENT HANDOFF REPORT  ED Nurse Name and Phone #:  Hawley Michel [phone]  S Name/Age/Gender Unk Marco Kennedy 72 y.o. male Room/Bed: 019C/019C  Code Status   Code Status: Full Code  Home/SNF/Other Home Patient oriented to: self, place, time, and situation Is this baseline? Yes   Triage Complete: Triage complete  Chief Complaint Bacteremia [R78.81]  Triage Note Pt here with reports of LL back pain came back last week. Pt states had blood work done and blood cultures came back +.    Allergies No Known Allergies  Level of Care/Admitting Diagnosis ED Disposition     ED Disposition  Admit   Condition  --   Comment  Hospital Area: [facility] [[id number]]  Level of Care: Telemetry Medical [[id number]]  May place patient in observation at [facility] or [facility] if equivalent level of care is available:: Yes  Covid Evaluation: Asymptomatic - no recent exposure (last 10 days) testing not required  Diagnosis: Bacteremia [790.7.ICD-9-CM]  Admitting Physician: Miguel Aschoff [[id number]]  Attending Physician: Miguel Aschoff [[id number]]          B Medical/Surgery History Past Medical History:  Diagnosis Date   Arthritis    arthritis-bilateral knees, left Hip.   Degenerative lumbar spinal stenosis    EKG abnormality    06-08-16- being evaluated by cardiologist  in [place]   Idiopathic scoliosis of lumbar region    Joint stiffness    Knee pain    Leg swelling    Low back pain    Achy, shooting pain.  Hurts to Walk or stand up straight.   Lumbar radiculopathy    Scoliosis concern    Sleep apnea    Bipap use nightly 25/19 -3l/m oxygen   Spondylolisthesis of lumbar region    Transfusion history    [place] ,[place] after bleeding post colon polyp removal and colonscopy procedure   Past Surgical History:  Procedure Laterality Date   ABDOMINAL EXPOSURE N/A 04/18/2020   Procedure: ABDOMINAL EXPOSURE;  Surgeon: [name], MD;  Location: [facility]  OR;  Service: Vascular;  Laterality: N/A;   ANTERIOR LAT LUMBAR FUSION Left 04/18/2020   Procedure: ANTERIOR LATERAL LUMBAR FUSION LUMBAR TWO-THREE, LUMBAR THREE-FOUR;  Surgeon: [name], MD;  Location: [facility] OR;  Service: Neurosurgery;  Laterality: Left;   ANTERIOR LUMBAR FUSION N/A 04/18/2020   Procedure: Lumbar Four-Five Lumbar Five- Sacral One Anterior lumbar Interbody Fusion;  Surgeon: [name], MD;  Location: [facility] OR;  Service: Neurosurgery;  Laterality: N/A;   APPLICATION OF INTRAOPERATIVE CT SCAN N/A 04/21/2020   Procedure: APPLICATION OF INTRAOPERATIVE CT SCAN;  Surgeon: [name], MD;  Location: [facility] OR;  Service: Neurosurgery;  Laterality: N/A;   BACK SURGERY     lumbar disc surgery   COLONOSCOPY W/ POLYPECTOMY     HERNIA REPAIR Left    left inguinal hernia repair   IR RADIOLOGIST EVAL & MGMT  02/24/2021   KNEE ARTHROSCOPY Right 2003    -scope for meniscus   LUMBAR WOUND DEBRIDEMENT N/A 01/13/2021   Procedure: THORACOLUMBAR WOUND DEBRIDEMENT;  Surgeon: [name], MD;  Location: [facility] OR;  Service: Neurosurgery;  Laterality: N/A;   LUMBAR WOUND DEBRIDEMENT N/A 03/14/2021   Procedure: WOUND EXPLORATION AND DEBRIDEMENT OF ABSCESS;  Surgeon: Lisbeth Renshaw, MD;  Location: [facility] OR;  Service: Neurosurgery;  Laterality: N/A;   POSTERIOR LUMBAR FUSION 4 LEVEL N/A 04/21/2020   Procedure: Thoracic ten to pelvis pedicle screw fixation with osteotomies;  Surgeon: [name], MD;  Location: [facility] OR;  Service: Neurosurgery;  Laterality: N/A;   POSTERIOR LUMBAR FUSION 4 LEVEL N/A 03/31/2021   Procedure: Thoracic six to Thoracic nine  Posterior decompression/Fusion with removal hardware thoracic ten;  Surgeon: [name], MD;  Location: [facility] OR;  Service: Neurosurgery;  Laterality: N/A;   THORACIC LAMINECTOMY FOR EPIDURAL ABSCESS N/A 03/14/2021   Procedure: THORACIC LAMINECTOMY, THORACIC NINE-THORACIC TEN;  Surgeon: Lisbeth Renshaw, MD;  Location: [facility] OR;  Service: Neurosurgery;   Laterality: N/A;   TOTAL HIP ARTHROPLASTY Left 06/15/2016   Procedure: LEFT TOTAL HIP ARTHROPLASTY ANTERIOR APPROACH;  Surgeon: [name], MD;  Location: [facility] ORS;  Service: Orthopedics;  Laterality: Left;     A IV Location/Drains/Wounds Patient Lines/Drains/Airways Status     Active Line/Drains/Airways     Name Placement date Placement time Site Days   Peripheral IV 11/20/22 20 G Anterior;Distal;Left;Upper Arm 11/20/22  1603  Arm  less than 1   Incision (Closed) 03/31/21 Back Other (Comment) 03/31/21  1829  -- 599            Intake/Output Last 24 hours No intake or output data in the 24 hours ending 11/20/22 1840  Labs/Imaging Results for orders placed or performed during the hospital encounter of 11/20/22 (from the past 48 hour(s))  Resp panel by RT-PCR (RSV, Flu A&B, Covid) Anterior Nasal Swab     Status: None   Collection Time: 11/20/22  4:14 PM   Specimen: Anterior Nasal Swab  Result Value Ref Range   SARS Coronavirus 2 by RT PCR NEGATIVE NEGATIVE   Influenza A by PCR NEGATIVE NEGATIVE   Influenza B by PCR NEGATIVE NEGATIVE    Comment: (NOTE) The Xpert Xpress SARS-CoV-2/FLU/RSV plus assay is intended as an aid in the diagnosis of influenza from Nasopharyngeal swab specimens and should not be used as a sole basis for treatment. Nasal washings and aspirates are unacceptable for Xpert Xpress SARS-CoV-2/FLU/RSV testing.  Fact Sheet for Patients: BloggerCourse.com  Fact Sheet for Healthcare Providers: SeriousBroker.it  This test is not yet approved or cleared by the [place] FDA and has been authorized for detection and/or diagnosis of SARS-CoV-2 by FDA under an Emergency Use Authorization (EUA). This EUA will remain in effect (meaning this test can be used) for the duration of the COVID-19 declaration under Section 564(b)(1) of the Act, 21 U.S.C. section 360bbb-3(b)(1), unless the authorization is terminated  or revoked.     Resp Syncytial Virus by PCR NEGATIVE NEGATIVE    Comment: (NOTE) Fact Sheet for Patients: BloggerCourse.com  Fact Sheet for Healthcare Providers: SeriousBroker.it  This test is not yet approved or cleared by the [place] FDA and has been authorized for detection and/or diagnosis of SARS-CoV-2 by FDA under an Emergency Use Authorization (EUA). This EUA will remain in effect (meaning this test can be used) for the duration of the COVID-19 declaration under Section 564(b)(1) of the Act, 21 U.S.C. section 360bbb-3(b)(1), unless the authorization is terminated or revoked.  Performed at [facility], [address], [place], [place] [address]   Sedimentation rate     Status: Abnormal   Collection Time: 11/20/22  4:15 PM  Result Value Ref Range   Sed Rate 60 (H) 0 - 16 mm/hr    Comment: Performed at [facility], [address], [place], [place] [address]  C-reactive protein     Status: Abnormal   Collection Time: 11/20/22  4:15 PM  Result Value Ref Range   CRP 36.4 (H) <1.0 mg/dL  Comment: Performed at [facility], [address] [place], [place] [address]  Comprehensive metabolic panel     Status: Abnormal   Collection Time: 11/20/22  4:15 PM  Result Value Ref Range   Sodium 131 (L) 135 - 145 mmol/L   Potassium 3.6 3.5 - 5.1 mmol/L   Chloride 96 (L) 98 - 111 mmol/L   CO2 24 22 - 32 mmol/L   Glucose, Bld 228 (H) 70 - 99 mg/dL    Comment: Glucose reference range applies only to samples taken after fasting for at least 8 hours.   BUN 21 8 - 23 mg/dL   Creatinine, Ser 9.52 (H) 0.61 - 1.24 mg/dL   Calcium 8.2 (L) 8.9 - 10.3 mg/dL   Total Protein 6.5 6.5 - 8.1 g/dL   Albumin 2.8 (L) 3.5 - 5.0 g/dL   AST 30 15 - 41 U/L   ALT 28 0 - 44 U/L   Alkaline Phosphatase 67 38 - 126 U/L   Total Bilirubin 0.6 0.3 - 1.2 mg/dL   GFR, Estimated 58 (L) >60 mL/min    Comment: (NOTE) Calculated using  the CKD-EPI Creatinine Equation (2021)    Anion gap 11 5 - 15    Comment: Performed at [facility], [address], [place], [place] [address]  CBC with Differential     Status: Abnormal   Collection Time: 11/20/22  4:15 PM  Result Value Ref Range   WBC 11.1 (H) 4.0 - 10.5 K/uL   RBC 4.25 4.22 - 5.81 MIL/uL   Hemoglobin 12.4 (L) 13.0 - 17.0 g/dL   HCT 44.0 10.2 - 72.5 %   MCV 91.8 80.0 - 100.0 fL   MCH 29.2 26.0 - 34.0 pg   MCHC 31.8 30.0 - 36.0 g/dL   RDW 36.6 44.0 - 34.7 %   Platelets 215 150 - 400 K/uL   nRBC 0.0 0.0 - 0.2 %   Neutrophils Relative % 86 %   Neutro Abs 9.6 (H) 1.7 - 7.7 K/uL   Lymphocytes Relative 4 %   Lymphs Abs 0.4 (L) 0.7 - 4.0 K/uL   Monocytes Relative 9 %   Monocytes Absolute 0.9 0.1 - 1.0 K/uL   Eosinophils Relative 0 %   Eosinophils Absolute 0.0 0.0 - 0.5 K/uL   Basophils Relative 0 %   Basophils Absolute 0.0 0.0 - 0.1 K/uL   Immature Granulocytes 1 %   Abs Immature Granulocytes 0.06 0.00 - 0.07 K/uL    Comment: Performed at [facility], [address] [place], [place] [address]  Protime-INR     Status: Abnormal   Collection Time: 11/20/22  4:15 PM  Result Value Ref Range   Prothrombin Time 15.3 (H) 11.4 - 15.2 seconds   INR 1.2 0.8 - 1.2    Comment: (NOTE) INR goal varies based on device and disease states. Performed at [facility], [address], [place], [place] [address]   APTT     Status: Abnormal   Collection Time: 11/20/22  4:15 PM  Result Value Ref Range   aPTT 23 (L) 24 - 36 seconds    Comment: Performed at [facility], [address] [place], [place] [address]  Lactic acid, plasma     Status: Abnormal   Collection Time: 11/20/22  4:16 PM  Result Value Ref Range   Lactic Acid, Venous 2.0 (HH) 0.5 - 1.9 mmol/L    Comment: CRITICAL RESULT CALLED TO, READ BACK BY  AND VERIFIED WITH R,Oriya Kettering RN @1735  11/20/22 E,[place] Performed at [facility] [place] [facility], [address], [place], [place] [address]    DG  Chest Port 1 View  Result Date: 11/20/2022 CLINICAL DATA:  Chest pain.  Questionable sepsis. EXAM: PORTABLE CHEST 1 VIEW COMPARISON:  04/01/2021 FINDINGS: Stable cardiomediastinal contours. Low lung volumes. No signs of pleural effusion or edema. No airspace opacities identified. Spinal fixation hardware noted within the mid and lower thoracic spine. No acute osseous findings. IMPRESSION: Low lung volumes. No acute findings. Electronically Signed   By: Signa Kell M.D.   On: 11/20/2022 17:23    Pending Labs Unresulted Labs (From admission, onward)     Start     Ordered   11/20/22 1554  Lactic acid, plasma  (Septic presentation on arrival (screening labs, nursing and treatment orders for obvious sepsis))  Now then every 2 hours,   R      11/20/22 1554   11/20/22 1554  Blood Culture (routine x 2)  (Septic presentation on arrival (screening labs, nursing and treatment orders for obvious sepsis))  BLOOD CULTURE X 2,   STAT      11/20/22 1554   11/20/22 1554  Urinalysis, Routine w reflex microscopic -Urine, Clean Catch  (Septic presentation on arrival (screening labs, nursing and treatment orders for obvious sepsis))  ONCE - URGENT,   URGENT       Question:  Specimen Source  Answer:  Urine, Clean Catch   11/20/22 1554   11/20/22 1554  Urine Culture (for pregnant, neutropenic or urologic patients or patients with an indwelling urinary catheter)  (Septic presentation on arrival (screening labs, nursing and treatment orders for obvious sepsis))  Add-on,   AD       Question:  Indication  Answer:  Sepsis   11/20/22 1554            Vitals/Pain Today's Vitals   11/20/22 1403 11/20/22 1604 11/20/22 1645 11/20/22 1806  BP:   (!) 110/54   Pulse:   69   Resp:   12   Temp:    98.7 F (37.1 C)  TempSrc:    Oral  SpO2:   90%   Weight:  131.5 kg    Height:  5\' 8"  (1.727 m)    PainSc: 6        Isolation Precautions No active isolations  Medications Medications  vancomycin (VANCOREADY) IVPB  2000 mg/400 mL (2,000 mg Intravenous New Bag/Given 11/20/22 1654)  lactated ringers infusion (has no administration in time range)  lactated ringers bolus 1,000 mL (0 mLs Intravenous Stopped 11/20/22 1806)    And  lactated ringers bolus 1,000 mL (1,000 mLs Intravenous New Bag/Given 11/20/22 1806)    And  lactated ringers bolus 250 mL (has no administration in time range)  fentaNYL (SUBLIMAZE) injection 50 mcg (0 mcg Intravenous Hold 11/20/22 1719)  enoxaparin (LOVENOX) injection 40 mg (has no administration in time range)  ceFEPIme (MAXIPIME) 2 g in sodium chloride 0.9 % 100 mL IVPB (0 g Intravenous Stopped 11/20/22 1653)    Mobility walks     Focused Assessments    R Recommendations: See Admitting Provider Note  Report given to:   Additional Notes:

## 2022-11-20 NOTE — Hospital Course (Addendum)
Still no BM. Asking about suppository. Feels like stuff is stuck in his throat. No trouble breathing. Discussed MRI findings and osteomyelitis. Discussed ID consult and continue IV abx. Family on phone, want to avoid surgery.  -ordered suppository, changed senna to 2tabs.   Saw Dr. Renold Don May 10th, took off cefadroxil after weeks of tapering down This past Monday, started having back pain, progressed over the next 2 days Blood work on Thursday, started back on antibiotics Friday, he went to ER due to fevers, chills and back pain. They collected labs This morning 2 out 2 bottles grew G- rods Denies any sick contact but reports some nausea.  Pain is worse when he moves. He reports some dizziness upon standing. No appetite the last 3 days. Feels constipated and bloated, last BM 4 days ago. Normally goes every other day. Some numbness in his toes and residual left leg weakness from first back surgery Denies diarrhea, weakness, falls, HA, CP, SOB, vomiting, dysuria  Went to therapy on Tuesday  FH: Mother with hypertension. Father had stomach cancer. Grandfather had leukemia  SH: Lives with wife and 92yo mother in [place], [place]. Worked for a [employer] until retiring in September 2023. Walks with cane inside, can go about 50 feet w/o assistive device. Denies any tobacco, ETOH or illicit drug use  Surgical Hx: Hip replacement in 2017, knee arthroscopy in 2003

## 2022-11-20 NOTE — H&P (Cosign Needed Addendum)
Date: 11/20/2022               Patient Name:  Marco Kennedy MRN: [id number]  DOB: Dec 23, 1950 Age / Sex: 72 y.o., male   PCP: Norval Gable Carlena Bjornstad, MD         Medical Service: Internal Medicine Teaching Service         Attending Physician: Dr. Mayford Knife, Dorene Ar, MD      First Contact: Dr. Rocky Morel, DO Pager [phone]    Second Contact: Dr. Sharrell Ku, MD Pager [phone]         After Hours (After 5p/  First Contact Pager: [phone]  weekends / holidays): Second Contact Pager: [phone]   SUBJECTIVE   Chief Complaint: Back pain  History of Present Illness:  Palmer Carls is a 72 year old male with incomplete paraplegia after extensive thoracolumbar surgery with hardware placement complicated by vertebral osteomyelitis and epidural abscess in 2022 and has been on suppressive antibiotic therapy since then the cefadroxil followed by ID.  He was being weaned off his antibiotics up until 11/05/2022 when he discontinued them per ID.  About a week later he started develop worsening left lower back pain without any associated lower extremity weakness.  He called his ID doctor who recommended him restart his antibiotics but then developed fevers, intermittent nausea, and fatigue.  He went to [place] on 11/19/2022 with history of blood cultures that were positive for gram-negative rods.  Patient was discharged before the result and they were called today.  He then reported to the ED at the request of ID to be evaluated inpatient by ID and neurosurgery.    Of note he did have raw milk ice cream last week.  Other than stopping his antibiotics he has not had any changes in medication or any other significant triggers including trauma.  He does have some mild orthostatic symptoms but has not fallen and has not had any syncope.  Does not usually have orthostatic symptoms.  Past Medical History Degenerative lumbar spinal stenosis s/p extensive thoracolumbar surgery with hardware in  place T2DM HTN HLD  Meds:  Amlodipine 10 mg daily Carvedilol 3.125 mg twice daily Cefadroxil [phone] mg twice daily Diclofenac 75 mg twice daily Duloxetine 60 mg nightly Metformin 750 mg twice daily Pravastatin 20 mg daily  Past Surgical History Past Surgical History:  Procedure Laterality Date   ABDOMINAL EXPOSURE N/A 04/18/2020   Procedure: ABDOMINAL EXPOSURE;  Surgeon: [name], MD;  Location[facility] OR;  Service: Vascular;  Laterality: N/A;   ANTERIOR LAT LUMBAR FUSION Left 04/18/2020   Procedure: ANTERIOR LATERAL LUMBAR FUSION LUMBAR TWO-THREE, LUMBAR THREE-FOUR;  Surgeon: [name], MD;  Location: [facility] OR;  Service: Neurosurgery;  Laterality: Left;   ANTERIOR LUMBAR FUSION N/A 04/18/2020   Procedure: Lumbar Four-Five Lumbar Five- Sacral One Anterior lumbar Interbody Fusion;  Surgeon: [name], MD;  Location: [facility] OR;  Service: Neurosurgery;  Laterality: N/A;   APPLICATION OF INTRAOPERATIVE CT SCAN N/A 04/21/2020   Procedure: APPLICATION OF INTRAOPERATIVE CT SCAN;  Surgeon: [name], MD;  Location: [facility] OR;  Service: Neurosurgery;  Laterality: N/A;   BACK SURGERY     lumbar disc surgery   COLONOSCOPY W/ POLYPECTOMY     HERNIA REPAIR Left    left inguinal hernia repair   IR RADIOLOGIST EVAL & MGMT  02/24/2021   KNEE ARTHROSCOPY Right 2003    -scope for meniscus   LUMBAR WOUND DEBRIDEMENT N/A 01/13/2021   Procedure: THORACOLUMBAR WOUND DEBRIDEMENT;  Surgeon: [name],  MD;  Location: [facility] OR;  Service: Neurosurgery;  Laterality: N/A;   LUMBAR WOUND DEBRIDEMENT N/A 03/14/2021   Procedure: WOUND EXPLORATION AND DEBRIDEMENT OF ABSCESS;  Surgeon: Lisbeth Renshaw, MD;  Location: [facility] OR;  Service: Neurosurgery;  Laterality: N/A;   POSTERIOR LUMBAR FUSION 4 LEVEL N/A 04/21/2020   Procedure: Thoracic ten to pelvis pedicle screw fixation with osteotomies;  Surgeon: [name], MD;  Location: [facility] OR;  Service: Neurosurgery;  Laterality: N/A;   POSTERIOR LUMBAR  FUSION 4 LEVEL N/A 03/31/2021   Procedure: Thoracic six to Thoracic nine  Posterior decompression/Fusion with removal hardware thoracic ten;  Surgeon: [name], MD;  Location: [facility] OR;  Service: Neurosurgery;  Laterality: N/A;   THORACIC LAMINECTOMY FOR EPIDURAL ABSCESS N/A 03/14/2021   Procedure: THORACIC LAMINECTOMY, THORACIC NINE-THORACIC TEN;  Surgeon: Lisbeth Renshaw, MD;  Location: [facility] OR;  Service: Neurosurgery;  Laterality: N/A;   TOTAL HIP ARTHROPLASTY Left 06/15/2016   Procedure: LEFT TOTAL HIP ARTHROPLASTY ANTERIOR APPROACH;  Surgeon: [name], MD;  Location: [facility] ORS;  Service: Orthopedics;  Laterality: Left;    Social:  Lives with wife in [place], [place]. Occupation: used to work as an Engineer, production of Function: Independent in ADLs and IADLs PCP: Hungarland, Carlena Bjornstad, MD Substances: Denies current or prior tobacco, alcohol, or drug use.  Family History:  Family History  Problem Relation Age of Onset   Hypertension Mother    Arthritis Mother    Stomach cancer Father    Hypertension Brother      Allergies: Allergies as of 11/20/2022   (No Known Allergies)    Review of Systems: A complete ROS was negative except as per HPI.   OBJECTIVE:   Physical Exam: Blood pressure 132/76, pulse 76, temperature 98.7 F (37.1 C), temperature source Oral, resp. rate 18, height 5\' 8"  (1.727 m), weight 131.5 kg, SpO2 93 %.  Constitutional: Well-appearing elderly male. In no acute distress. HENT: Normocephalic, atraumatic,  Eyes: Sclera non-icteric, PERRL, EOM intact Cardio:Regular rate and rhythm. No murmurs, rubs, or gallops. 2+ bilateral radial and dorsalis pedis  pulses. Pulm:Clear to auscultation bilaterally. Normal work of breathing on room air. Abdomen: Soft, non-tender, mildly distended, positive bowel sounds. MSK: Trace lower extremity edema. Skin:Warm and dry.  No skin changes over thoracic or lumbar spine other than well-healed surgical midline scars. Neuro:Alert  and oriented x3.  5/5 strength in right hip flexion, 4/5 strength in right dorsiflexion, 4/5 strength in left hip flexion, 3/5 strength in left dorsiflexion, 5/5 strength in bilateral plantarflexion.  Slightly decreased sensation in bilateral toes but otherwise intact sensation in upper and lower extremities.  5/5 strength in bilateral upper extremity.  Cranial nerves II through XII intact. Psych:Pleasant mood and affect.  Labs: CBC    Component Value Date/Time   WBC 11.1 (H) 11/20/2022 1615   RBC 4.25 11/20/2022 1615   HGB 12.4 (L) 11/20/2022 1615   HCT 39.0 11/20/2022 1615   PLT 215 11/20/2022 1615   MCV 91.8 11/20/2022 1615   MCH 29.2 11/20/2022 1615   MCHC 31.8 11/20/2022 1615   RDW 14.1 11/20/2022 1615   LYMPHSABS 0.4 (L) 11/20/2022 1615   MONOABS 0.9 11/20/2022 1615   EOSABS 0.0 11/20/2022 1615   BASOSABS 0.0 11/20/2022 1615     CMP     Component Value Date/Time   NA 131 (L) 11/20/2022 1615   K 3.6 11/20/2022 1615   CL 96 (L) 11/20/2022 1615   CO2 24 11/20/2022 1615   GLUCOSE 228 (H) 11/20/2022 1615  BUN 21 11/20/2022 1615   CREATININE 1.31 (H) 11/20/2022 1615   CREATININE 0.91 11/05/2022 1101   CALCIUM 8.2 (L) 11/20/2022 1615   PROT 6.5 11/20/2022 1615   ALBUMIN 2.8 (L) 11/20/2022 1615   AST 30 11/20/2022 1615   ALT 28 11/20/2022 1615   ALKPHOS 67 11/20/2022 1615   BILITOT 0.6 11/20/2022 1615   GFRNONAA 58 (L) 11/20/2022 1615   GFRAA >60 06/16/2016 0425    Imaging: DG Chest Port 1 View Result Date: 11/20/2022 IMPRESSION: Low lung volumes. No acute findings.  Electronically Signed   By: Signa Kell M.D.   On: 11/20/2022 17:23     EKG: personally reviewed my interpretation is sinus rhythm with right bundle branch block.  ASSESSMENT & PLAN:   Assessment & Plan by Problem: Principal Problem:   Bacteremia   RAWLIN ANTOLINI is a 72 y.o. male with pertinent PMH of T2DM, hypertension, and degenerative lumbar spinal stenosis s/p extensive thoracal lumbar  surgery with hardware in place complicated by vertebral osteomyelitis and epidural abscess in 2022 on chronic suppressive antibiotic therapy who presented with gram-negative rod bacteremia and is admitted for ID and neurosurgical evaluation.  Bacteremia, gram negative rods Thoracolumbar surgery with hardware complicated by osteomyelitis and epidural abscess on chronic antibiotic therapy Seen in outside ED for increased lower back pain and a high risk patient.  Blood cultures showed gram-negative rods and patient was called.  CT findings faxed over show diffuse T9 vertebral body sclerosis extending into the bilateral pedicles without expansion which is new from prior study.  By the recommendation of ID he presented to the ED for admission and evaluation by ID and neurosurgery inpatient.  Signs been stable and he has been afebrile.  Lactic acid 2.0, mild leukocytosis 11.1, elevated inflammatory markers with normal CRP on 11/05/2022.. pain is stable and responsive to IV 1 pain medication.  Given a dose of vancomycin and cefepime in the ED.  Neurosurgery and ID paged by the ED.  Neurosurgery will see the patient, still waiting on ID. - Repeat blood cultures - Continue vancomycin and cefepime for now - MRI C/T/L-spine - Follow-up neurosurgery and ID recommendations - Dilaudid 0.5 mg IV every 4 hours as needed - PT/OT  AKI Creatinine 1.31 with GFR 58 up from a creatinine of 0.91 on 11/05/2022 and 0.8 on 11/19/2022.  Likely prerenal with possible low blood pressures due to bacteremia and decreased oral intake.  IV rehydration in the ED with 2 L LR. - Repeat labs in the morning - UA pending  T2DM A1c 5.9 in 12/2020.  On metformin 750 mg twice daily. - Repeat A1c - Moderate SSI  HTN Home meds include amlodipine 10 mg daily and carvedilol 3.125 mg daily.  Blood pressures here normotensive and heart rate in the 70s/80s. - Resume home meds in the morning  Constipation Patient notes constipation for the  last 4 days while taking opiates.  Since we are going to continue giving opiates for his acute pain we will start a bowel regimen as well.  Diet: Carb-Modified VTE: Enoxaparin Code: Full  Dispo: Admit patient to Observation with expected length of stay less than 2 midnights.  Signed: Rocky Morel, DO Internal Medicine Resident PGY-1  11/20/2022, 6:58 PM   Dr. Rocky Morel, DO Pager [phone]

## 2022-11-20 NOTE — Progress Notes (Signed)
Pharmacy Antibiotic Note  Marco Kennedy is a 72 y.o. male for which pharmacy has been consulted for cefepime and vancomycin dosing for bacteremia.  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 This was complicated by vertebral osteomyelitis and epidural abscess in 2022 and has been on cefadroxil suppressive therapy since. On 11/05/2022 this was stopped per ID. Patient developed fever after this and ID recommended re-starting cefadroxil. Patient then went to [place] on 11/19/2022 where blood cultures have reportedly resulted with GNRs.  SCr 1.31 - above baseline WBC 11.1; LA 2; T 98.6; HR 82; RR 16 COVID neg / flu neg  Plan: Cefepime 2g q8hr  Vancomycin 2000 mg once then 1500 mg q24hr (eAUC 503.3) unless change in renal function Trend WBC, Fever, Renal function F/u cultures, clinical course, WBC De-escalate when able Levels at steady state F/u ID recommendations  Height: 5\' 8"  (172.7 cm) Weight: 131.5 kg (290 lb) IBW/kg (Calculated) : 68.4  Temp (24hrs), Avg:98.7 F (37.1 C), Min:98.6 F (37 C), Max:98.7 F (37.1 C)  Recent Labs  Lab 11/20/22 1615 11/20/22 1616  WBC 11.1*  --   CREATININE 1.31*  --   LATICACIDVEN  --  2.0*    Estimated Creatinine Clearance: 67.5 mL/min (A) (by C-G formula based on SCr of 1.31 mg/dL (H)).    No Known Allergies  Microbiology results: Pending  Thank you for allowing pharmacy to be a part of this patient's care.  Delmar Landau, PharmD, BCPS 11/20/2022 7:39 PM ED Clinical Pharmacist -  [phone]

## 2022-11-20 NOTE — ED Notes (Signed)
Paged [employer] - PGY-3 [place] to [place]

## 2022-11-20 NOTE — ED Triage Notes (Signed)
Pt here with reports of LL back pain came back last week. Pt states had blood work done and blood cultures came back +.

## 2022-11-21 ENCOUNTER — Observation Stay (HOSPITAL_COMMUNITY): Payer: Medicare Other

## 2022-11-21 DIAGNOSIS — J90 Pleural effusion, not elsewhere classified: Secondary | ICD-10-CM | POA: Diagnosis present

## 2022-11-21 DIAGNOSIS — G8222 Paraplegia, incomplete: Secondary | ICD-10-CM | POA: Diagnosis present

## 2022-11-21 DIAGNOSIS — Z6841 Body Mass Index (BMI) 40.0 and over, adult: Secondary | ICD-10-CM | POA: Diagnosis not present

## 2022-11-21 DIAGNOSIS — T8459XA Infection and inflammatory reaction due to other internal joint prosthesis, initial encounter: Secondary | ICD-10-CM | POA: Diagnosis present

## 2022-11-21 DIAGNOSIS — R7881 Bacteremia: Secondary | ICD-10-CM | POA: Diagnosis present

## 2022-11-21 DIAGNOSIS — I10 Essential (primary) hypertension: Secondary | ICD-10-CM

## 2022-11-21 DIAGNOSIS — B9620 Unspecified Escherichia coli [E. coli] as the cause of diseases classified elsewhere: Secondary | ICD-10-CM | POA: Diagnosis present

## 2022-11-21 DIAGNOSIS — E1169 Type 2 diabetes mellitus with other specified complication: Secondary | ICD-10-CM | POA: Diagnosis not present

## 2022-11-21 DIAGNOSIS — E785 Hyperlipidemia, unspecified: Secondary | ICD-10-CM | POA: Diagnosis present

## 2022-11-21 DIAGNOSIS — Z1152 Encounter for screening for COVID-19: Secondary | ICD-10-CM | POA: Diagnosis not present

## 2022-11-21 DIAGNOSIS — J9811 Atelectasis: Secondary | ICD-10-CM | POA: Diagnosis present

## 2022-11-21 DIAGNOSIS — M4126 Other idiopathic scoliosis, lumbar region: Secondary | ICD-10-CM | POA: Diagnosis present

## 2022-11-21 DIAGNOSIS — K5900 Constipation, unspecified: Secondary | ICD-10-CM | POA: Diagnosis present

## 2022-11-21 DIAGNOSIS — M4620 Osteomyelitis of vertebra, site unspecified: Secondary | ICD-10-CM | POA: Diagnosis not present

## 2022-11-21 DIAGNOSIS — G062 Extradural and subdural abscess, unspecified: Secondary | ICD-10-CM | POA: Diagnosis present

## 2022-11-21 DIAGNOSIS — M50223 Other cervical disc displacement at C6-C7 level: Secondary | ICD-10-CM | POA: Diagnosis present

## 2022-11-21 DIAGNOSIS — E8809 Other disorders of plasma-protein metabolism, not elsewhere classified: Secondary | ICD-10-CM | POA: Diagnosis not present

## 2022-11-21 DIAGNOSIS — E46 Unspecified protein-calorie malnutrition: Secondary | ICD-10-CM | POA: Diagnosis present

## 2022-11-21 DIAGNOSIS — M4624 Osteomyelitis of vertebra, thoracic region: Secondary | ICD-10-CM | POA: Diagnosis present

## 2022-11-21 DIAGNOSIS — N179 Acute kidney failure, unspecified: Secondary | ICD-10-CM | POA: Diagnosis present

## 2022-11-21 DIAGNOSIS — Z96642 Presence of left artificial hip joint: Secondary | ICD-10-CM | POA: Diagnosis present

## 2022-11-21 DIAGNOSIS — G061 Intraspinal abscess and granuloma: Secondary | ICD-10-CM | POA: Diagnosis not present

## 2022-11-21 DIAGNOSIS — Z7984 Long term (current) use of oral hypoglycemic drugs: Secondary | ICD-10-CM

## 2022-11-21 DIAGNOSIS — R0902 Hypoxemia: Secondary | ICD-10-CM | POA: Diagnosis not present

## 2022-11-21 DIAGNOSIS — M4644 Discitis, unspecified, thoracic region: Secondary | ICD-10-CM | POA: Diagnosis present

## 2022-11-21 DIAGNOSIS — Z79899 Other long term (current) drug therapy: Secondary | ICD-10-CM | POA: Diagnosis not present

## 2022-11-21 DIAGNOSIS — Y828 Other medical devices associated with adverse incidents: Secondary | ICD-10-CM | POA: Diagnosis present

## 2022-11-21 LAB — CBC WITH DIFFERENTIAL/PLATELET
Abs Immature Granulocytes: 0.07 K/uL (ref 0.00–0.07)
Basophils Absolute: 0 K/uL (ref 0.0–0.1)
Basophils Relative: 0 %
Eosinophils Absolute: 0.1 K/uL (ref 0.0–0.5)
Eosinophils Relative: 0 %
HCT: 39.9 % (ref 39.0–52.0)
Hemoglobin: 13.1 g/dL (ref 13.0–17.0)
Immature Granulocytes: 1 %
Lymphocytes Relative: 6 %
Lymphs Abs: 0.6 K/uL — ABNORMAL LOW (ref 0.7–4.0)
MCH: 29.1 pg (ref 26.0–34.0)
MCHC: 32.8 g/dL (ref 30.0–36.0)
MCV: 88.7 fL (ref 80.0–100.0)
Monocytes Absolute: 1.2 K/uL — ABNORMAL HIGH (ref 0.1–1.0)
Monocytes Relative: 11 %
Neutro Abs: 9.3 K/uL — ABNORMAL HIGH (ref 1.7–7.7)
Neutrophils Relative %: 82 %
Platelets: 237 K/uL (ref 150–400)
RBC: 4.5 MIL/uL (ref 4.22–5.81)
RDW: 13.8 % (ref 11.5–15.5)
WBC: 11.3 K/uL — ABNORMAL HIGH (ref 4.0–10.5)
nRBC: 0 % (ref 0.0–0.2)

## 2022-11-21 LAB — GLUCOSE, CAPILLARY
Glucose-Capillary: 247 mg/dL — ABNORMAL HIGH (ref 70–99)
Glucose-Capillary: 189 mg/dL — ABNORMAL HIGH (ref 70–99)
Glucose-Capillary: 181 mg/dL — ABNORMAL HIGH (ref 70–99)
Glucose-Capillary: 143 mg/dL — ABNORMAL HIGH (ref 70–99)
Glucose-Capillary: 198 mg/dL — ABNORMAL HIGH (ref 70–99)

## 2022-11-21 LAB — RENAL FUNCTION PANEL
Albumin: 2.6 g/dL — ABNORMAL LOW (ref 3.5–5.0)
Anion gap: 10 (ref 5–15)
BUN: 17 mg/dL (ref 8–23)
CO2: 26 mmol/L (ref 22–32)
Calcium: 8.1 mg/dL — ABNORMAL LOW (ref 8.9–10.3)
Chloride: 97 mmol/L — ABNORMAL LOW (ref 98–111)
Creatinine, Ser: 0.94 mg/dL (ref 0.61–1.24)
GFR, Estimated: >60 mL/min
Glucose, Bld: 209 mg/dL — ABNORMAL HIGH (ref 70–99)
Phosphorus: 2.4 mg/dL — ABNORMAL LOW (ref 2.5–4.6)
Potassium: 3.5 mmol/L (ref 3.5–5.1)
Sodium: 133 mmol/L — ABNORMAL LOW (ref 135–145)

## 2022-11-21 LAB — CULTURE, BLOOD (ROUTINE X 2): Culture: NO GROWTH

## 2022-11-21 MED ORDER — GADOBUTROL 1 MMOL/ML IV SOLN
10.0000 mL | Freq: Once | INTRAVENOUS | Status: AC | PRN
  Administered 2022-11-21: 10 mL via INTRAVENOUS

## 2022-11-21 MED ORDER — POTASSIUM PHOSPHATES 15 MMOLE/5ML IV SOLN
15.0000 mmol | Freq: Once | INTRAVENOUS | Status: AC
  Administered 2022-11-21: 15 mmol via INTRAVENOUS
  Filled 2022-11-21: qty 5

## 2022-11-21 MED ORDER — VANCOMYCIN HCL 2000 MG/400ML IV SOLN
2000.0000 mg | INTRAVENOUS | Status: DC
  Administered 2022-11-21: 2000 mg via INTRAVENOUS
  Filled 2022-11-21: qty 400

## 2022-11-21 NOTE — ED Provider Notes (Signed)
[facility] Provider Note   CSN: [id number] Arrival date & time: 11/20/22  1349     History Chief Complaint  Patient presents with   Back Pain    Marco Kennedy is a 72 y.o. male with h/o HTN, DM, incomplete paraplegia after extensive thoracolumbar surgery with hardware placement complicated by vertebral osteomyelitis and epidural abscess in 2022 and has been on suppressive antibiotic therapy since then the cefadroxil followed by ID presents to the ER for back pain and positive blood cultures. On 11/05/22, he was seen by ID and his daily antibiotic of cefadroxil was discontinued. He was initially weaned off, but stopped on the 10th. Around 7-10 days later, he started to experience midline and left sided thoracic and lumbar pain. He called Dr. Renold Don with ID were he ordered more labs this past Thursday. Reports that his ESR and CRP as well as his WBC was elevated and that he needed to resume the Cefadroxil 1000mg  BID. He started to have pain worsening and went to [facility] on Friday. Per wife, he was seen there and they spoke with Dr. Lovell Sheehan with [facility] who recommended calling the office on Tuesday for outpatient MRI. Apparently, he had blood cultures done that day as well. Per the wife, they were called by a "Dr.Zane" and was told that "2/2 were positive for gram negative rods" and that they needed to go to the ER and recommended [facility]. Denies any fevers or chills but reports some diaphoresis and nausea. No fevers, saddle anesthesia, urinary fecal incontinence, or urinary retention. Denies any IVDU ever. He reports that his leg and foot weakness is at it's baseline. Reports he feels fine with laying, and worsened pain with moving or standing. NKDA.    Back Pain Associated symptoms: numbness (at baseline) and weakness (at baseline)   Associated symptoms: no chest pain, no dysuria and no fever        Home Medications Prior to Admission medications   Medication Sig Start Date End  Date Taking? Authorizing Provider  acetaminophen (TYLENOL) 500 MG tablet Take 1,000 mg by mouth 2 (two) times daily as needed for moderate pain, fever or headache.   Yes [provider]  amLODipine (NORVASC) 10 MG tablet Take 1 tablet (10 mg total) by mouth daily. 06/04/21  Yes Lovorn, Aundra Millet, MD  carvedilol (COREG) 3.125 MG tablet Take 1 tablet (3.125 mg total) by mouth 2 (two) times daily with a meal. 06/04/21  Yes Lovorn, Aundra Millet, MD  cefadroxil (DURICEF) 500 MG capsule Take 1 capsule by mouth twice daily Patient taking differently: Take 1,000 mg by mouth 2 (two) times daily. 08/20/22  Yes Vu, Tonita Phoenix, MD  Cholecalciferol (VITAMIN D-3 PO) Take 1 capsule by mouth at bedtime.   Yes [provider]  diclofenac (VOLTAREN) 75 MG EC tablet Take 75 mg by mouth 2 (two) times daily. 05/13/21  Yes [provider]  DULoxetine (CYMBALTA) 60 MG capsule TAKE 1 CAPSULE (60 MG TOTAL) BY MOUTH AT BEDTIME. Patient taking differently: Take 60 mg by mouth at bedtime. 08/10/22  Yes Lovorn, Aundra Millet, MD  metFORMIN (GLUCOPHAGE-XR) 750 MG 24 hr tablet Take 750 mg by mouth 2 (two) times daily. 06/12/22  Yes [provider]  Multiple Vitamin (MULTIVITAMIN) tablet Take 1 tablet by mouth at bedtime.   Yes [provider]  pravastatin (PRAVACHOL) 20 MG tablet Take 20 mg by mouth daily. 06/12/22  Yes [provider]      Allergies  Patient has no known allergies.    Review of Systems   Review of Systems  Constitutional:  Positive for diaphoresis. Negative for chills and fever.  Respiratory:  Negative for shortness of breath.   Cardiovascular:  Negative for chest pain.  Gastrointestinal:  Positive for nausea. Negative for abdominal distention, constipation, diarrhea and vomiting.       Denies any fecal incontinence  Genitourinary:  Negative for dysuria and hematuria.       Denies any urinary incontinence or urinary retention.  Musculoskeletal:  Positive for back pain.        Denies any saddle anesthesia  Neurological:  Positive for weakness (at baseline) and numbness (at baseline).    Physical Exam Updated Vital Signs BP 132/68 (BP Location: Right Arm)   Pulse 95   Temp 100.1 F (37.8 C)   Resp 16   Ht 5\' 8"  (1.727 m)   Wt 131.5 kg   SpO2 93%   BMI 44.09 kg/m  Physical Exam Vitals and nursing note reviewed.  Constitutional:      General: He is not in acute distress.    Appearance: Normal appearance. He is not toxic-appearing or diaphoretic.  Eyes:     General: No scleral icterus. Cardiovascular:     Rate and Rhythm: Normal rate and regular rhythm.     Pulses:          Dorsalis pedis pulses are 2+ on the right side and 2+ on the left side.       Posterior tibial pulses are 2+ on the right side and 2+ on the left side.  Pulmonary:     Effort: Pulmonary effort is normal. No respiratory distress.  Musculoskeletal:        General: Tenderness present. No deformity.     Cervical back: Normal range of motion.       Back:     Comments: Tenderness to the marked area above.  No overlying erythema, edema, fluctuance, induration, warmth, or crepitus.  Patient does have faint surgical incision noted to the midline back.  Does have decreased strength in the lower extremities mainly with resistance.  Dorsiflexion more weak on the right foot in comparison to the left.  Reports sensation is symmetric bilaterally.  Skin:    General: Skin is warm and dry.  Neurological:     General: No focal deficit present.     Mental Status: He is alert. Mental status is at baseline.     ED Results / Procedures / Treatments   Labs (all labs ordered are listed, but only abnormal results are displayed) Labs Reviewed  SEDIMENTATION RATE - Abnormal; Notable for the following components:      Result Value   Sed Rate 60 (*)    All other components within normal limits  C-REACTIVE PROTEIN - Abnormal; Notable for the following components:   CRP 36.4 (*)    All other  components within normal limits  LACTIC ACID, PLASMA - Abnormal; Notable for the following components:   Lactic Acid, Venous 2.0 (*)    All other components within normal limits  COMPREHENSIVE METABOLIC PANEL - Abnormal; Notable for the following components:   Sodium 131 (*)    Chloride 96 (*)    Glucose, Bld 228 (*)    Creatinine, Ser 1.31 (*)    Calcium 8.2 (*)    Albumin 2.8 (*)    GFR, Estimated 58 (*)    All other components within normal limits  CBC WITH DIFFERENTIAL/PLATELET - Abnormal;  Notable for the following components:   WBC 11.1 (*)    Hemoglobin 12.4 (*)    Neutro Abs 9.6 (*)    Lymphs Abs 0.4 (*)    All other components within normal limits  PROTIME-INR - Abnormal; Notable for the following components:   Prothrombin Time 15.3 (*)    All other components within normal limits  APTT - Abnormal; Notable for the following components:   aPTT 23 (*)    All other components within normal limits  GLUCOSE, CAPILLARY - Abnormal; Notable for the following components:   Glucose-Capillary 163 (*)    All other components within normal limits  GLUCOSE, CAPILLARY - Abnormal; Notable for the following components:   Glucose-Capillary 175 (*)    All other components within normal limits  RESP PANEL BY RT-PCR (RSV, FLU A&B, COVID)  RVPGX2  CULTURE, BLOOD (ROUTINE X 2)  CULTURE, BLOOD (ROUTINE X 2)  URINE CULTURE  LACTIC ACID, PLASMA  URINALYSIS, ROUTINE W REFLEX MICROSCOPIC  HEMOGLOBIN A1C  CBC WITH DIFFERENTIAL/PLATELET  RENAL FUNCTION PANEL    EKG None  Radiology DG Chest Port 1 View  Result Date: 11/20/2022 CLINICAL DATA:  Chest pain.  Questionable sepsis. EXAM: PORTABLE CHEST 1 VIEW COMPARISON:  04/01/2021 FINDINGS: Stable cardiomediastinal contours. Low lung volumes. No signs of pleural effusion or edema. No airspace opacities identified. Spinal fixation hardware noted within the mid and lower thoracic spine. No acute osseous findings. IMPRESSION: Low lung volumes. No  acute findings. Electronically Signed   By: Signa Kell M.D.   On: 11/20/2022 17:23    Procedures .Critical Care  Performed by: Achille Rich, PA-C Authorized by: Achille Rich, PA-C   Critical care provider statement:    Critical care time (minutes):  60   Critical care was necessary to treat or prevent imminent or life-threatening deterioration of the following conditions:  Sepsis   Critical care was time spent personally by me on the following activities:  Development of treatment plan with patient or surrogate, discussions with consultants, evaluation of patient's response to treatment, examination of patient, ordering and review of laboratory studies, ordering and review of radiographic studies, ordering and performing treatments and interventions, pulse oximetry, re-evaluation of patient's condition, review of old charts and obtaining history from patient or surrogate   Care discussed with: admitting provider      Medications Ordered in ED Medications  lactated ringers infusion (has no administration in time range)  lactated ringers bolus 1,000 mL (0 mLs Intravenous Stopped 11/20/22 1806)    And  lactated ringers bolus 1,000 mL (0 mLs Intravenous Stopped 11/20/22 1927)    And  lactated ringers bolus 250 mL (has no administration in time range)  fentaNYL (SUBLIMAZE) injection 50 mcg (0 mcg Intravenous Hold 11/20/22 1719)  enoxaparin (LOVENOX) injection 60 mg (60 mg Subcutaneous Given 11/20/22 2105)  DULoxetine (CYMBALTA) DR capsule 60 mg (60 mg Oral Given 11/20/22 2105)  pravastatin (PRAVACHOL) tablet 20 mg (20 mg Oral Given 11/20/22 2108)  amLODipine (NORVASC) tablet 10 mg (10 mg Oral Not Given 11/20/22 2102)  carvedilol (COREG) tablet 3.125 mg (has no administration in time range)  insulin aspart (novoLOG) injection 0-15 Units ( Subcutaneous Not Given 11/20/22 2107)  HYDROmorphone (DILAUDID) injection 0.5 mg (has no administration in time range)  polyethylene glycol (MIRALAX /  GLYCOLAX) packet 17 g (17 g Oral Given 11/20/22 2109)  senna-docusate (Senokot-S) tablet 1 tablet (1 tablet Oral Given 11/20/22 2109)  ceFEPIme (MAXIPIME) 2 g in sodium chloride 0.9 % 100 mL  IVPB (has no administration in time range)  vancomycin (VANCOREADY) IVPB 1500 mg/300 mL (has no administration in time range)  ceFEPIme (MAXIPIME) 2 g in sodium chloride 0.9 % 100 mL IVPB (0 g Intravenous Stopped 11/20/22 1653)  vancomycin (VANCOREADY) IVPB 2000 mg/400 mL (0 mg Intravenous Stopped 11/20/22 1927)    ED Course/ Medical Decision Making/ A&P Clinical Course as of 11/21/22 0051  Sat Nov 20, 2022  Leeroy Bock, NP from neurosurgery on board. Recommends ID consultation during admission as well. Consultation placed now.  [RR]    Clinical Course User Index [RR] Achille Rich, PA-C    Medical Decision Making Amount and/or Complexity of Data Reviewed Labs: ordered. Radiology: ordered. ECG/medicine tests: ordered.  Risk Prescription drug management. Decision regarding hospitalization.    72 y.o. male presents to the ER for evaluation of positive blood cultures and back pain. Differential diagnosis includes but is not limited to Fracture (acute/chronic), muscle strain, cauda equina, spinal stenosis, DDD, ligamentous injury, disk herniation, metastatic cancer, vertebral osteomyelitis, kidney stone, pyelonephritis, AAA, pancreatitis, bowel obstruction, meningitis, infected hardware. Vital signs unremarkable. Physical exam as noted above.   Unfortunately, I am limited on what charts I can view form [place]. CT scan report significant for "Diffuse T9 vertebral body sclerosis extending into the bilateral pedicles without expansion. These findings are new from the prior study. The need for further evaluation with bone scan should be determined clinically." WBC 12.8. Otherwise, I can not read the HPI or MDM for this visit.   Given the positive blood cultures with reported gram negative rods per patient's  wife, discussed with pharmacist on coverage. 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 Cefepime and vancomycin ordered based on this information as we do not have the records or organism report now. Will order sepsis labs. Consult placed to ID and neurosurgery as the patient is with [facility] Neurosurgery. MRI with and without ordered for the C/T/L spine. PVR ordered as well.   Neurosurgery was consulted and spoke with [facility] NP. She does not have any additional recommendations and will add to the list of people to see. Recommends ID consultation. Still have yet to hear from ID.   I independently reviewed and interpreted the patient's labs.,  Flu, RSV negative.  PT eval elevated 10.3 but normal INR.  Send rate is elevated at 60.  CRP elevated at 36.4.  Lactic acid initially elevated at 2.0, repeat pending.  aPTT slightly down to 23.  CBC does show slight leukocytosis with a white blood count 11.1 however there is a left shift.  Mild anemia 12.4.  Normal platelets.  CMP does show some hyponatremia at 131 with a decrease in chloride as well.  Glucose at 228.  Patient does have an uptrending creatinine 1.3 which appears his baseline is around 0.7 0.8.  Decrease in calcium and albumin.  No other electrolyte or LFT abnormality.  Blood cultures pending.  From reading the notes sent over from [place], the blood cultures were positive for gram negative bacilli.   Patient will need admission. Patient and wife are amenable. Internal Medicine Teaching service to admit. Spoke with Dr. Geraldo Pitter to inform him that I still have not heard back from ID and asked that they consult them. I gave him in medical records send over from [place] as well.   I discussed this case with my attending physician who cosigned this note including patient's presenting symptoms, physical exam, and planned diagnostics  and interventions. Attending physician stated agreement with plan or made changes  to plan which were implemented.   Attending physician assessed patient at bedside.  Final Clinical Impression(s) / ED Diagnoses Final diagnoses:  Sepsis, due to unspecified organism, unspecified whether acute organ dysfunction present [facility])  AKI (acute kidney injury) [facility])    Rx / DC Orders ED Discharge Orders     None         Achille Rich, PA-C 11/21/22 0115    Elayne Snare K, DO 11/21/22 1458

## 2022-11-21 NOTE — Progress Notes (Signed)
OT Cancellation Note  Patient Details Name: TEVYN TEO MRN: [id number] DOB: Mar 14, 1951   Cancelled Treatment:    Reason Eval/Treat Not Completed: Patient at procedure or test/ unavailable. At MRI will follow up as time and schedule allow.   Tyler Deis, OTR/L [facility] Acute Rehabilitation Office: [phone]   Myrla Halsted 11/21/2022, 2:37 PM

## 2022-11-21 NOTE — Consult Note (Signed)
Reason for Consult: Back pain Referring Physician: Dr. Rodney Kennedy is an 72 y.o. male.  HPI: The patient is a 72 year old white male who presents with a complicated history.  He had an anterior and posterior T10 to the pelvis fusion by Dr. Venetia Maxon on 04/18/2020 and 04/21/2020.  This was complicated by a wound infection requiring incision and drainage on 01/13/2021.  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 Conchita Paris.  He developed a proximal junctional kyphosis requiring extension of his fusion to T6 by Dr. Venetia Maxon on 03/31/2021.  The patient has been followed by Dr. Jake Samples.  The patient had a follow-up thoracic MRI on 04/16/2022 this demonstrated the sequelae of his old infection as well as the hardware but no active infection.  The patient has been treated with chronic antibiotics by Dr. Renold Don, infectious disease.  The antibiotics were stopped on 11/05/2022.  About 10 days later the patient began having back pain.  He was seen at an outside ER and blood cultures were obtained which evidently grew gram-negative rods.  The patient was admitted to [facility] for further evaluation and treatment.  Presently the patient is alert and pleasant.  He complains of left-sided pain at the thoracolumbar junction.  He is some chronic difficulty with ambulation and numbness in his feet but this has not changed.  He is not complaining of any other radicular symptoms.  He has had no trouble with his wound.  Total spine MRIs are pending.  Past Medical History:  Diagnosis Date   Arthritis    arthritis-bilateral knees, left Hip.   Degenerative lumbar spinal stenosis    EKG abnormality    06-08-16- being evaluated by cardiologist  in [place]   Idiopathic scoliosis of lumbar region    Joint stiffness    Knee pain    Leg swelling    Low back pain    Achy, shooting pain.  Hurts to Walk or stand up straight.   Lumbar  radiculopathy    Scoliosis concern    Sleep apnea    Bipap use nightly 25/19 -3l/m oxygen   Spondylolisthesis of lumbar region    Transfusion history    [place] ,[place] after bleeding post colon polyp removal and colonscopy procedure    Past Surgical History:  Procedure Laterality Date   ABDOMINAL EXPOSURE N/A 04/18/2020   Procedure: ABDOMINAL EXPOSURE;  Surgeon: [name], MD;  Location: [facility] OR;  Service: Vascular;  Laterality: N/A;   ANTERIOR LAT LUMBAR FUSION Left 04/18/2020   Procedure: ANTERIOR LATERAL LUMBAR FUSION LUMBAR TWO-THREE, LUMBAR THREE-FOUR;  Surgeon: [name], MD;  Location: [facility] OR;  Service: Neurosurgery;  Laterality: Left;   ANTERIOR LUMBAR FUSION N/A 04/18/2020   Procedure: Lumbar Four-Five Lumbar Five- Sacral One Anterior lumbar Interbody Fusion;  Surgeon: [name], MD;  Location: [facility] OR;  Service: Neurosurgery;  Laterality: N/A;   APPLICATION OF INTRAOPERATIVE CT SCAN N/A 04/21/2020   Procedure: APPLICATION OF INTRAOPERATIVE CT SCAN;  Surgeon: [name], MD;  Location: [facility] OR;  Service: Neurosurgery;  Laterality: N/A;   BACK SURGERY     lumbar disc surgery   COLONOSCOPY W/ POLYPECTOMY     HERNIA REPAIR Left    left inguinal hernia repair   IR RADIOLOGIST EVAL & MGMT  02/24/2021   KNEE ARTHROSCOPY Right 2003    -scope for meniscus   LUMBAR WOUND DEBRIDEMENT N/A 01/13/2021   Procedure: THORACOLUMBAR WOUND DEBRIDEMENT;  Surgeon: Venetia Maxon,  Jomarie Longs, MD;  Location: [facility] OR;  Service: Neurosurgery;  Laterality: N/A;   LUMBAR WOUND DEBRIDEMENT N/A 03/14/2021   Procedure: WOUND EXPLORATION AND DEBRIDEMENT OF ABSCESS;  Surgeon: Lisbeth Renshaw, MD;  Location: [facility] OR;  Service: Neurosurgery;  Laterality: N/A;   POSTERIOR LUMBAR FUSION 4 LEVEL N/A 04/21/2020   Procedure: Thoracic ten to pelvis pedicle screw fixation with osteotomies;  Surgeon: [name], MD;  Location: [facility] OR;  Service: Neurosurgery;  Laterality: N/A;   POSTERIOR LUMBAR FUSION 4 LEVEL N/A  03/31/2021   Procedure: Thoracic six to Thoracic nine  Posterior decompression/Fusion with removal hardware thoracic ten;  Surgeon: [name], MD;  Location: [facility] OR;  Service: Neurosurgery;  Laterality: N/A;   THORACIC LAMINECTOMY FOR EPIDURAL ABSCESS N/A 03/14/2021   Procedure: THORACIC LAMINECTOMY, THORACIC NINE-THORACIC TEN;  Surgeon: Lisbeth Renshaw, MD;  Location: [facility] OR;  Service: Neurosurgery;  Laterality: N/A;   TOTAL HIP ARTHROPLASTY Left 06/15/2016   Procedure: LEFT TOTAL HIP ARTHROPLASTY ANTERIOR APPROACH;  Surgeon: [name], MD;  Location: [facility] ORS;  Service: Orthopedics;  Laterality: Left;    Family History  Problem Relation Age of Onset   Hypertension Mother    Arthritis Mother    Stomach cancer Father    Hypertension Brother     Social History:  reports that he has never smoked. He has never used smokeless tobacco. He reports that he does not currently use alcohol. He reports that he does not use drugs.  Allergies: No Known Allergies  Medications: I have reviewed the patient's current medications. Prior to Admission:  Medications Prior to Admission  Medication Sig Dispense Refill Last Dose   acetaminophen (TYLENOL) 500 MG tablet Take 1,000 mg by mouth 2 (two) times daily as needed for moderate pain, fever or headache.   Past Week   amLODipine (NORVASC) 10 MG tablet Take 1 tablet (10 mg total) by mouth daily. 30 tablet 0 11/20/2022   carvedilol (COREG) 3.125 MG tablet Take 1 tablet (3.125 mg total) by mouth 2 (two) times daily with a meal. 60 tablet 0 11/20/2022 at 0830   cefadroxil (DURICEF) 500 MG capsule Take 1 capsule by mouth twice daily (Patient taking differently: Take 1,000 mg by mouth 2 (two) times daily.) 60 capsule 2 11/20/2022   Cholecalciferol (VITAMIN D-3 PO) Take 1 capsule by mouth at bedtime.   11/19/2022   diclofenac (VOLTAREN) 75 MG EC tablet Take 75 mg by mouth 2 (two) times daily.   11/20/2022   DULoxetine (CYMBALTA) 60 MG capsule TAKE 1 CAPSULE (60 MG  TOTAL) BY MOUTH AT BEDTIME. (Patient taking differently: Take 60 mg by mouth at bedtime.) 90 capsule 1 11/19/2022   metFORMIN (GLUCOPHAGE-XR) 750 MG 24 hr tablet Take 750 mg by mouth 2 (two) times daily.   11/20/2022   Multiple Vitamin (MULTIVITAMIN) tablet Take 1 tablet by mouth at bedtime.   11/19/2022   pravastatin (PRAVACHOL) 20 MG tablet Take 20 mg by mouth daily.   11/20/2022   Scheduled:  amLODipine  10 mg Oral Daily   carvedilol  3.125 mg Oral BID WC   DULoxetine  60 mg Oral QHS   enoxaparin (LOVENOX) injection  60 mg Subcutaneous Q24H   fentaNYL (SUBLIMAZE) injection  50 mcg Intravenous Once   insulin aspart  0-15 Units Subcutaneous Q4H   polyethylene glycol  17 g Oral Daily   pravastatin  20 mg Oral Daily   senna-docusate  1 tablet Oral QHS   Continuous:  ceFEPime (MAXIPIME) IV 2 g (11/21/22 0054)  lactated ringers     vancomycin     ZOX:[id number] (DILAUDID) injection Anti-infectives (From admission, onward)    Start     Dose/Rate Route Frequency Ordered Stop   11/21/22 1700  vancomycin (VANCOREADY) IVPB 1500 mg/300 mL        1,500 mg 150 mL/hr over 120 Minutes Intravenous Every 24 hours 11/20/22 1958     11/21/22 0000  ceFEPIme (MAXIPIME) 2 g in sodium chloride 0.9 % 100 mL IVPB        2 g 200 mL/hr over 30 Minutes Intravenous Every 8 hours 11/20/22 1958     11/20/22 1600  ceFEPIme (MAXIPIME) 2 g in sodium chloride 0.9 % 100 mL IVPB        2 g 200 mL/hr over 30 Minutes Intravenous  Once 11/20/22 1547 11/20/22 1653   11/20/22 1600  vancomycin (VANCOREADY) IVPB 2000 mg/400 mL        2,000 mg 200 mL/hr over 120 Minutes Intravenous  Once 11/20/22 1547 11/20/22 1927        Results for orders placed or performed during the hospital encounter of 11/20/22 (from the past 48 hour(s))  Resp panel by RT-PCR (RSV, Flu A&B, Covid) Anterior Nasal Swab     Status: None   Collection Time: 11/20/22  4:14 PM   Specimen: Anterior Nasal Swab  Result Value Ref Range   SARS  Coronavirus 2 by RT PCR NEGATIVE NEGATIVE   Influenza A by PCR NEGATIVE NEGATIVE   Influenza B by PCR NEGATIVE NEGATIVE    Comment: (NOTE) The Xpert Xpress SARS-CoV-2/FLU/RSV plus assay is intended as an aid in the diagnosis of influenza from Nasopharyngeal swab specimens and should not be used as a sole basis for treatment. Nasal washings and aspirates are unacceptable for Xpert Xpress SARS-CoV-2/FLU/RSV testing.  Fact Sheet for Patients: BloggerCourse.com  Fact Sheet for Healthcare Providers: SeriousBroker.it  This test is not yet approved or cleared by the [place] FDA and has been authorized for detection and/or diagnosis of SARS-CoV-2 by FDA under an Emergency Use Authorization (EUA). This EUA will remain in effect (meaning this test can be used) for the duration of the COVID-19 declaration under Section 564(b)(1) of the Act, 21 U.S.C. section 360bbb-3(b)(1), unless the authorization is terminated or revoked.     Resp Syncytial Virus by PCR NEGATIVE NEGATIVE    Comment: (NOTE) Fact Sheet for Patients: BloggerCourse.com  Fact Sheet for Healthcare Providers: SeriousBroker.it  This test is not yet approved or cleared by the [place] FDA and has been authorized for detection and/or diagnosis of SARS-CoV-2 by FDA under an Emergency Use Authorization (EUA). This EUA will remain in effect (meaning this test can be used) for the duration of the COVID-19 declaration under Section 564(b)(1) of the Act, 21 U.S.C. section 360bbb-3(b)(1), unless the authorization is terminated or revoked.  Performed at [facility], [address], [place], [place] [address]   Sedimentation rate     Status: Abnormal   Collection Time: 11/20/22  4:15 PM  Result Value Ref Range   Sed Rate 60 (H) 0 - 16 mm/hr    Comment: Performed at [facility], [address]  [place], [place] [address]  C-reactive protein     Status: Abnormal   Collection Time: 11/20/22  4:15 PM  Result Value Ref Range   CRP 36.4 (H) <1.0 mg/dL    Comment: Performed at [facility], [address], [place], [place] [address]  Comprehensive metabolic panel  Status: Abnormal   Collection Time: 11/20/22  4:15 PM  Result Value Ref Range   Sodium 131 (L) 135 - 145 mmol/L   Potassium 3.6 3.5 - 5.1 mmol/L   Chloride 96 (L) 98 - 111 mmol/L   CO2 24 22 - 32 mmol/L   Glucose, Bld 228 (H) 70 - 99 mg/dL    Comment: Glucose reference range applies only to samples taken after fasting for at least 8 hours.   BUN 21 8 - 23 mg/dL   Creatinine, Ser 1.61 (H) 0.61 - 1.24 mg/dL   Calcium 8.2 (L) 8.9 - 10.3 mg/dL   Total Protein 6.5 6.5 - 8.1 g/dL   Albumin 2.8 (L) 3.5 - 5.0 g/dL   AST 30 15 - 41 U/L   ALT 28 0 - 44 U/L   Alkaline Phosphatase 67 38 - 126 U/L   Total Bilirubin 0.6 0.3 - 1.2 mg/dL   GFR, Estimated 58 (L) >60 mL/min    Comment: (NOTE) Calculated using the CKD-EPI Creatinine Equation (2021)    Anion gap 11 5 - 15    Comment: Performed at [facility], [address], [place], [place] [address]  CBC with Differential     Status: Abnormal   Collection Time: 11/20/22  4:15 PM  Result Value Ref Range   WBC 11.1 (H) 4.0 - 10.5 K/uL   RBC 4.25 4.22 - 5.81 MIL/uL   Hemoglobin 12.4 (L) 13.0 - 17.0 g/dL   HCT 54.0 98.1 - 19.1 %   MCV 91.8 80.0 - 100.0 fL   MCH 29.2 26.0 - 34.0 pg   MCHC 31.8 30.0 - 36.0 g/dL   RDW 47.8 29.5 - 62.1 %   Platelets 215 150 - 400 K/uL   nRBC 0.0 0.0 - 0.2 %   Neutrophils Relative % 86 %   Neutro Abs 9.6 (H) 1.7 - 7.7 K/uL   Lymphocytes Relative 4 %   Lymphs Abs 0.4 (L) 0.7 - 4.0 K/uL   Monocytes Relative 9 %   Monocytes Absolute 0.9 0.1 - 1.0 K/uL   Eosinophils Relative 0 %   Eosinophils Absolute 0.0 0.0 - 0.5 K/uL   Basophils Relative 0 %   Basophils Absolute 0.0 0.0 - 0.1 K/uL   Immature Granulocytes 1 %   Abs Immature  Granulocytes 0.06 0.00 - 0.07 K/uL    Comment: Performed at [facility], [address] [place], [place] [address]  Protime-INR     Status: Abnormal   Collection Time: 11/20/22  4:15 PM  Result Value Ref Range   Prothrombin Time 15.3 (H) 11.4 - 15.2 seconds   INR 1.2 0.8 - 1.2    Comment: (NOTE) INR goal varies based on device and disease states. Performed at [facility], [address], [place], [place] [address]   APTT     Status: Abnormal   Collection Time: 11/20/22  4:15 PM  Result Value Ref Range   aPTT 23 (L) 24 - 36 seconds    Comment: Performed at [facility], [address], [place], [place] [address]  Lactic acid, plasma     Status: Abnormal   Collection Time: 11/20/22  4:16 PM  Result Value Ref Range   Lactic Acid, Venous 2.0 (HH) 0.5 - 1.9 mmol/L    Comment: CRITICAL RESULT CALLED TO, READ BACK BY AND VERIFIED WITH R,GONZALEZ RN @1735  11/20/22 E,BENTON Performed at [facility], [address], [place], [place] [address]  Lactic acid, plasma     Status: None   Collection Time: 11/20/22  8:19 PM  Result Value Ref Range   Lactic Acid, Venous 1.9 0.5 - 1.9 mmol/L    Comment: Performed at [facility], [address] [place], [place] [address]  Glucose, capillary     Status: Abnormal   Collection Time: 11/20/22  8:41 PM  Result Value Ref Range   Glucose-Capillary 163 (H) 70 - 99 mg/dL    Comment: Glucose reference range applies only to samples taken after fasting for at least 8 hours.  Glucose, capillary     Status: Abnormal   Collection Time: 11/20/22 11:44 PM  Result Value Ref Range   Glucose-Capillary 175 (H) 70 - 99 mg/dL    Comment: Glucose reference range applies only to samples taken after fasting for at least 8 hours.  Glucose, capillary     Status: Abnormal   Collection Time: 11/21/22  4:12 AM  Result Value Ref Range   Glucose-Capillary 247 (H) 70 - 99 mg/dL    Comment: Glucose reference range applies only to samples taken  after fasting for at least 8 hours.  CBC with Differential/Platelet     Status: Abnormal   Collection Time: 11/21/22  5:41 AM  Result Value Ref Range   WBC 11.3 (H) 4.0 - 10.5 K/uL   RBC 4.50 4.22 - 5.81 MIL/uL   Hemoglobin 13.1 13.0 - 17.0 g/dL   HCT 78.9 38.1 - 01.7 %   MCV 88.7 80.0 - 100.0 fL   MCH 29.1 26.0 - 34.0 pg   MCHC 32.8 30.0 - 36.0 g/dL   RDW 51.0 25.8 - 52.7 %   Platelets 237 150 - 400 K/uL   nRBC 0.0 0.0 - 0.2 %   Neutrophils Relative % 82 %   Neutro Abs 9.3 (H) 1.7 - 7.7 K/uL   Lymphocytes Relative 6 %   Lymphs Abs 0.6 (L) 0.7 - 4.0 K/uL   Monocytes Relative 11 %   Monocytes Absolute 1.2 (H) 0.1 - 1.0 K/uL   Eosinophils Relative 0 %   Eosinophils Absolute 0.1 0.0 - 0.5 K/uL   Basophils Relative 0 %   Basophils Absolute 0.0 0.0 - 0.1 K/uL   Immature Granulocytes 1 %   Abs Immature Granulocytes 0.07 0.00 - 0.07 K/uL    Comment: Performed at [facility], [address], [place], [place] [address]  Renal function panel     Status: Abnormal   Collection Time: 11/21/22  5:41 AM  Result Value Ref Range   Sodium 133 (L) 135 - 145 mmol/L   Potassium 3.5 3.5 - 5.1 mmol/L   Chloride 97 (L) 98 - 111 mmol/L   CO2 26 22 - 32 mmol/L   Glucose, Bld 209 (H) 70 - 99 mg/dL    Comment: Glucose reference range applies only to samples taken after fasting for at least 8 hours.   BUN 17 8 - 23 mg/dL   Creatinine, Ser 3.53 0.61 - 1.24 mg/dL   Calcium 8.1 (L) 8.9 - 10.3 mg/dL   Phosphorus 2.4 (L) 2.5 - 4.6 mg/dL   Albumin 2.6 (L) 3.5 - 5.0 g/dL   GFR, Estimated >61 >44 mL/min    Comment: (NOTE) Calculated using the CKD-EPI Creatinine Equation (2021)    Anion gap 10 5 - 15    Comment: Performed at [facility], [address] [place], [place] [address]  Glucose, capillary     Status: Abnormal  Collection Time: 11/21/22  7:39 AM  Result Value Ref Range   Glucose-Capillary 189 (H) 70 - 99 mg/dL    Comment: Glucose reference range applies only to samples taken  after fasting for at least 8 hours.   Comment 1 Notify RN    Comment 2 Document in Chart     DG Chest Port 1 View  Result Date: 11/20/2022 CLINICAL DATA:  Chest pain.  Questionable sepsis. EXAM: PORTABLE CHEST 1 VIEW COMPARISON:  04/01/2021 FINDINGS: Stable cardiomediastinal contours. Low lung volumes. No signs of pleural effusion or edema. No airspace opacities identified. Spinal fixation hardware noted within the mid and lower thoracic spine. No acute osseous findings. IMPRESSION: Low lung volumes. No acute findings. Electronically Signed   By: Signa Kell M.D.   On: 11/20/2022 17:23    ROS: As above Blood pressure 122/65, pulse 87, temperature 98 F (36.7 C), resp. rate 18, height 5\' 8"  (1.727 m), weight 131.5 kg, SpO2 91 %. Estimated body mass index is 44.09 kg/m as calculated from the following:   Height as of this encounter: 5\' 8"  (1.727 m).   Weight as of this encounter: 131.5 kg.  Physical Exam  General: An alert and pleasant obese 72 year old white male in no apparent distress.  HEENT: Normocephalic, atraumatic, extraocular muscles are intact.  Neck: Unremarkable, Spurling's testing is negative.  Lhermitte sign was not present.  Thorax: Symmetric  Abdomen: Obese  Extremities: Unremarkable  Back exam: The patient's thoracolumbar incision is well-healed.  Neurologic exam: The patient is alert and oriented x 3.  Cranial nerves II through XII were examined bilaterally grossly normal.  The patient's motor strength is 5/5 in spinal bicep, tricep, handgrip, quadricep, gastrocnemius and dorsiflexors.  He has some chronic numbness in his feet but otherwise his sensation is unremarkable.  Cerebellar function is intact to rapid alternating movements of the upper extremities bilaterally.  Assessment/Plan: Back pain, positive blood culture: I have discussed the situation with the patient.  We are awaiting MRI scans of the cervical, thoracic and lumbar spine to assess for recurrent  osteomyelitis, wound infection, etc.  I have answered all his questions.  Cristi Loron 11/21/2022, 7:47 AM

## 2022-11-21 NOTE — Progress Notes (Signed)
PT Cancellation Note  Patient Details Name: Marco Kennedy MRN: [id number] DOB: [birth year]   Cancelled Treatment:    Reason Eval/Treat Not Completed: Patient at procedure or test/unavailable. Pt off the floor for MRI. PT to return as able to complete PT eval.  Lewis Shock, PT, DPT Acute Rehabilitation Services Secure chat preferred Office #: [phone]    Iona Hansen 11/21/2022, 1:18 PM

## 2022-11-21 NOTE — Progress Notes (Addendum)
Internal Medicine Attending:   I saw and examined the patient. I reviewed the resident's note and I agree with the resident's findings and plan as documented in the resident's note.  See addendum to H&P.   Subjective:   Hospital day: 1  Overnight event: No acute events overnight  Interim History: Patient evaluated at bedside laying comfortably in bed. States that the neurosurgeon came back to see him this morning. He has yet to see the ID doctor. His pain is currently well-controlled when laying still. No fevers or chills overnight. He wants to attempt to eat some food today.  Objective:  Vital signs in last 24 hours: Vitals:   11/20/22 2035 11/20/22 2342 11/21/22 0414 11/21/22 0741  BP: (!) 142/69 132/68 137/70 122/65  Pulse: 93 95 90 87  Resp: 16 16 16 18   Temp: 99.3 F (37.4 C) 100.1 F (37.8 C) 99 F (37.2 C) 98 F (36.7 C)  TempSrc: Oral Oral Oral   SpO2: 91% 93% 91% 91%  Weight:      Height:        Filed Weights   11/20/22 1604  Weight: 131.5 kg     Intake/Output Summary (Last 24 hours) at 11/21/2022 0746 Last data filed at 11/20/2022 1927 Gross per 24 hour  Intake 1400 ml  Output --  Net 1400 ml   Net IO Since Admission: 1,400 mL [11/21/22 0746]  Recent Labs    11/20/22 2344 11/21/22 0412 11/21/22 0739  GLUCAP 175* 247* 189*     Pertinent Labs:    Latest Ref Rng & Units 11/21/2022    5:41 AM 11/20/2022    4:15 PM 11/05/2022   11:01 AM  CBC  WBC 4.0 - 10.5 K/uL 11.3  11.1  9.0   Hemoglobin 13.0 - 17.0 g/dL 16.1  09.6  04.5   Hematocrit 39.0 - 52.0 % 39.9  39.0  41.2   Platelets 150 - 400 K/uL 237  215  310        Latest Ref Rng & Units 11/21/2022    5:41 AM 11/20/2022    4:15 PM 11/05/2022   11:01 AM  CMP  Glucose 70 - 99 mg/dL 409  811  914   BUN 8 - 23 mg/dL 17  21  25    Creatinine 0.61 - 1.24 mg/dL 7.82  9.56  2.13   Sodium 135 - 145 mmol/L 133  131  141   Potassium 3.5 - 5.1 mmol/L 3.5  3.6  4.9   Chloride 98 - 111 mmol/L 97   96  104   CO2 22 - 32 mmol/L 26  24  28    Calcium 8.9 - 10.3 mg/dL 8.1  8.2  9.9   Total Protein 6.5 - 8.1 g/dL  6.5  6.8   Total Bilirubin 0.3 - 1.2 mg/dL  0.6  0.4   Alkaline Phos 38 - 126 U/L  67    AST 15 - 41 U/L  30  20   ALT 0 - 44 U/L  28  20     Imaging: DG Chest Port 1 View  Result Date: 11/20/2022 CLINICAL DATA:  Chest pain.  Questionable sepsis. EXAM: PORTABLE CHEST 1 VIEW COMPARISON:  04/01/2021 FINDINGS: Stable cardiomediastinal contours. Low lung volumes. No signs of pleural effusion or edema. No airspace opacities identified. Spinal fixation hardware noted within the mid and lower thoracic spine. No acute osseous findings. IMPRESSION: Low lung volumes. No acute findings. Electronically Signed   By:  Signa Kell M.D.   On: 11/20/2022 17:23    Physical Exam  General: Pleasant, well-appearing obese elderly man laying in bed. No acute distress. CV: RRR. No m/r/g. Trace BLE edema Pulmonary: Lungs CTAB. Normal effort. No wheezing or rales. Abdominal: Soft, nontender, nondistended. Normal bowel sounds. Extremities: Radial and DP pulses 2+ and symmetric. Skin: Warm and dry. No obvious rash or lesions. Neuro: A&Ox3. Moves all extremities. Normal sensation to gross touch.  Psych: Normal mood and affect  Assessment/Plan: Marco Kennedy is a 72 y.o. male with hx of T2DM, hypertension, and degenerative lumbar spinal stenosis s/p extensive thoracal lumbar surgery with hardware in place complicated by vertebral osteomyelitis and epidural abscess in 2022 on chronic suppressive antibiotic therapy who presented with gram-negative rod bacteremia and is admitted for ID and neurosurgical evaluation.   Principal Problem:   Bacteremia  # Gram-negative bacteremia # Recurrent vertebral osteomyelitis # Paravertebral abscesses Patient sent to [facility] for ID and neurosurgery eval due to positive gram-negative rods in blood culture from outside hospital.  He is hemodynamically stable and at  baseline mental status. Blood cultures have been repeated he is currently on broad-spectrum antibiotics. MRI of the spine shows recurrent T10-T11 osteomyelitis discitis as well as bilateral paravertebral abscesses.  Patient currently pending ID eval. -Neurosurgery following, appreciate recs -Pending ID eval -Continue vancomycin and cefepime -Follow-up blood cultures -IV Dilaudid 0.5 mg q4h prn for pain -PT/OT  # HTN BP stable with SBP in the 120s to 130s. -Will resume home meds if BP continues to rise  # T2DM (need confirmation; prediabetes listed in chart, A1c 5.9 in 2022) Repeat A1c still in process.  Blood sugars still elevated to the 180s today. -Continue SSI with meals -CBG monitoring  # AKI, resolved # Hypophosphatemia Creatinine improved from 1.31 to 0.94 after IV fluids. Phosphate slightly low at 2.4 with K+ 3.5. -IV K-Phos 15 mmol x1 -Trend renal function panel  Diet: Carb modified IVF: None VTE: Lovenox CODE: Full code  Prior to Admission Living Arrangement: Home Anticipated Discharge Location: Pending Barriers to Discharge: Medical stability Dispo: Anticipated discharge in approximately 2-3 day(s).   Signed: Steffanie Rainwater, MD 11/21/2022, 7:46 AM  Pager: [phone] Internal Medicine Teaching Service After 5pm on weekdays and 1pm on weekends: On Call pager: [phone]

## 2022-11-21 NOTE — Progress Notes (Signed)
Pharmacy Antibiotic Note  Marco Kennedy is a 72 y.o. male admitted on 11/20/2022 with bacteremia.  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 This was complicated by vertebral osteomyelitis and epidural abscess in 2022 and has been on cefadroxil suppressive therapy since. On 11/05/2022 this was stopped per ID. Patient developed fever after this and ID recommended re-starting cefadroxil. Patient then went to [place] on 11/19/2022 where blood cultures have reportedly resulted with GNRs. SCr trending down from 1.31 to 0.94 and CrCl 94 ml/min. WBC 11.3 and Tmax 100.1 F. Pharmacy has been consulted to dose cefepime and vancomycin. Will increase vancomycin dose based on improving renal function.   Plan: Increase vancomycin to 2000 mg every 24 hours (eAUC 495) Continue cefepime 2 g every 8 hours F/u cultures, clinical course, WBC De-escalate when able Levels at steady state F/u ID recommendations  Height: 5\' 8"  (172.7 cm) Weight: 131.5 kg (290 lb) IBW/kg (Calculated) : 68.4  Temp (24hrs), Avg:99 F (37.2 C), Min:98 F (36.7 C), Max:100.1 F (37.8 C)  Recent Labs  Lab 11/20/22 1615 11/20/22 1616 11/20/22 2019 11/21/22 0541  WBC 11.1*  --   --  11.3*  CREATININE 1.31*  --   --  0.94  LATICACIDVEN  --  2.0* 1.9  --     Estimated Creatinine Clearance: 94 mL/min (by C-G formula based on SCr of 0.94 mg/dL).    No Known Allergies  Antimicrobials this admission: Cefepime 5/26 >>  Vancomycin 5/26 >>   Dose adjustments this admission: 5/26 Increase vancomycin from 1500 mg q24h to 2000 mg q24h with improving renal function  Microbiology results: 5/25 BCx: no growth<24 hours 5/24 BCx [place]: reported GNR  Thank you for allowing pharmacy to be a part of this patient's care.  Georga Hacking 11/21/2022 2:05 PM

## 2022-11-22 DIAGNOSIS — M4624 Osteomyelitis of vertebra, thoracic region: Secondary | ICD-10-CM

## 2022-11-22 DIAGNOSIS — B9620 Unspecified Escherichia coli [E. coli] as the cause of diseases classified elsewhere: Secondary | ICD-10-CM

## 2022-11-22 DIAGNOSIS — R7881 Bacteremia: Secondary | ICD-10-CM | POA: Diagnosis not present

## 2022-11-22 DIAGNOSIS — M4644 Discitis, unspecified, thoracic region: Secondary | ICD-10-CM

## 2022-11-22 DIAGNOSIS — Z6841 Body Mass Index (BMI) 40.0 and over, adult: Secondary | ICD-10-CM

## 2022-11-22 LAB — RENAL FUNCTION PANEL
Albumin: 2.5 g/dL — ABNORMAL LOW (ref 3.5–5.0)
Anion gap: 11 (ref 5–15)
BUN: 14 mg/dL (ref 8–23)
CO2: 25 mmol/L (ref 22–32)
Calcium: 7.9 mg/dL — ABNORMAL LOW (ref 8.9–10.3)
Chloride: 96 mmol/L — ABNORMAL LOW (ref 98–111)
Creatinine, Ser: 0.89 mg/dL (ref 0.61–1.24)
GFR, Estimated: >60 mL/min (ref >60)
Glucose, Bld: 177 mg/dL — ABNORMAL HIGH (ref 70–99)
Phosphorus: 2.8 mg/dL (ref 2.5–4.6)
Potassium: 3.9 mmol/L (ref 3.5–5.1)
Sodium: 132 mmol/L — ABNORMAL LOW (ref 135–145)

## 2022-11-22 LAB — GLUCOSE, CAPILLARY
Glucose-Capillary: 143 mg/dL — ABNORMAL HIGH (ref 70–99)
Glucose-Capillary: 167 mg/dL — ABNORMAL HIGH (ref 70–99)
Glucose-Capillary: 168 mg/dL — ABNORMAL HIGH (ref 70–99)
Glucose-Capillary: 170 mg/dL — ABNORMAL HIGH (ref 70–99)
Glucose-Capillary: 176 mg/dL — ABNORMAL HIGH (ref 70–99)
Glucose-Capillary: 252 mg/dL — ABNORMAL HIGH (ref 70–99)

## 2022-11-22 LAB — CBC
HCT: 37.6 % — ABNORMAL LOW (ref 39.0–52.0)
Hemoglobin: 12.6 g/dL — ABNORMAL LOW (ref 13.0–17.0)
MCH: 29.4 pg (ref 26.0–34.0)
MCHC: 33.5 g/dL (ref 30.0–36.0)
MCV: 87.9 fL (ref 80.0–100.0)
Platelets: 229 10*3/uL (ref 150–400)
RBC: 4.28 MIL/uL (ref 4.22–5.81)
RDW: 13.6 % (ref 11.5–15.5)
WBC: 13 10*3/uL — ABNORMAL HIGH (ref 4.0–10.5)
nRBC: 0 % (ref 0.0–0.2)

## 2022-11-22 LAB — CULTURE, BLOOD (ROUTINE X 2): Special Requests: ADEQUATE

## 2022-11-22 MED ORDER — ACETAMINOPHEN 325 MG PO TABS
650.0000 mg | ORAL_TABLET | Freq: Three times a day (TID) | ORAL | Status: DC
  Administered 2022-11-22 – 2022-11-24 (×7): 650 mg via ORAL
  Filled 2022-11-22 (×7): qty 2

## 2022-11-22 MED ORDER — HYDROMORPHONE HCL 1 MG/ML IJ SOLN: 0.5000 mg | INTRAMUSCULAR | Status: DC | PRN

## 2022-11-22 MED ORDER — SENNOSIDES-DOCUSATE SODIUM 8.6-50 MG PO TABS
2.0000 | ORAL_TABLET | Freq: Every day | ORAL | Status: DC
  Administered 2022-11-22 – 2022-11-23 (×2): 2 via ORAL
  Filled 2022-11-22 (×2): qty 2

## 2022-11-22 MED ORDER — BISACODYL 10 MG RE SUPP
10.0000 mg | Freq: Once | RECTAL | Status: AC
  Administered 2022-11-22: 10 mg via RECTAL
  Filled 2022-11-22: qty 1

## 2022-11-22 MED ORDER — IPRATROPIUM-ALBUTEROL 0.5-2.5 (3) MG/3ML IN SOLN: 3.0000 mL | Freq: Four times a day (QID) | RESPIRATORY_TRACT | Status: DC | PRN

## 2022-11-22 MED ORDER — OXYCODONE HCL 5 MG PO TABS: 10.0000 mg | ORAL_TABLET | Freq: Four times a day (QID) | ORAL | Status: DC | PRN

## 2022-11-22 MED ORDER — CEFAZOLIN SODIUM-DEXTROSE 2-4 GM/100ML-% IV SOLN
2.0000 g | Freq: Three times a day (TID) | INTRAVENOUS | Status: DC
  Administered 2022-11-22 – 2022-11-24 (×6): 2 g via INTRAVENOUS
  Filled 2022-11-22 (×6): qty 100

## 2022-11-22 NOTE — Consult Note (Signed)
Date of Admission:  11/20/2022          Reason for Consult:  disktis osteomyelitis complicated by hardware now with E coli bacteremia   Referring Provider: Charissa Bash, MD   Assessment:  Recurrent thoracic spine vertebral osteomyelitis discitis -complicated by extensive hardware with paravertebral abscesses and bacteremia all due to E. coli Incomplete plegia Type 2 diabetes Hypertension Hyperlipidemia  Plan:  Switch to cefazolin Sure blood cultures taken on admission at [facility] are negative He should receive a 6-8 week course of IV cefazolin followed by lifelong high dose cefadroxil  Dr. Renold Don to see tomorrow.  Principal Problem:   Vertebral osteomyelitis, acute recurrent (HCC) Active Problems:   Obesity, Class III, BMI 40-49.9 (morbid obesity) (HCC)   Discitis of thoracic region   Hypoalbuminemia due to protein-calorie malnutrition (HCC)   Bacteremia   Scheduled Meds:  acetaminophen  650 mg Oral Q8H   amLODipine  10 mg Oral Daily   bisacodyl  10 mg Rectal Once   carvedilol  3.125 mg Oral BID WC   DULoxetine  60 mg Oral QHS   enoxaparin (LOVENOX) injection  60 mg Subcutaneous Q24H   fentaNYL (SUBLIMAZE) injection  50 mcg Intravenous Once   insulin aspart  0-15 Units Subcutaneous Q4H   polyethylene glycol  17 g Oral Daily   pravastatin  20 mg Oral Daily   senna-docusate  2 tablet Oral QHS   Continuous Infusions:   ceFAZolin (ANCEF) IV     lactated ringers     PRN Meds:.HYDROmorphone (DILAUDID) injection, oxyCODONE  HPI: Marco Kennedy is a 72 y.o. male with history of extensive thoracolumbar surgery with placement of hardware in October 2021  complicated by thoracic vertebral osteomyelitis epidural abscess in July 2022 status post I&D with E. coli isolated.  He was then readmitted after being discharged with fluid overload and bilateral pleural effusions and fever.  He underwent chest tube placement.  He also the retroperitoneal fluid collection  that was drained in August 2022.  He had a right knee effusion also aspirated without evidence of infection is then admitted in September 2022 worsening pain and numbness in his legs and imaging showed worsening epidural process and abdominal fluid collection while on antibiotics he underwent another I&D in September 2022 who and more hardware was placed for stabilization.  Draining abdominal abscess was removed and repeat CT showed that the abscess had resolved.  Intraoperative cultures were unrevealing the time.  The patient was treated with IV antibiotics and then transition to oral antibiotics ultimately cefadroxil.  He has incomplete paraplegia.  He had been on these antibiotics through Nov 05, 2022 saw my partner Dr. Renold Don had been closely following him in the outpatient world patient was doing sufficiently well that it was felt that a trial off antibiotics was warranted.  Unfortunately I May 22 the patient noticed reemergence of his back pain for 4 days.  It reminded him of his prior original infection.  He clinic and arrangements were made for the patient to get labs done at his primary care physician.  At the PCP office CRP was at 20 CBC with a white count of 16,000 normal creatinine.  He was continued have pain and was instructed to resume his cefadroxil which she did.  He then however developed fevers and chills was instructed to go to the nearest emergency department which she did in [place], [place].  Cultures were taken in the ER and he was felt  to be stable and he was discharged to home.  Unfortunately blood cultures came back positive for E. Coli.  Neurosurgery and Dr. Lovell Sheehan were also informed and ultimately the patient came to the emergency department was admitted to [facility] to the internal medicine teaching service.  Blood cultures were drawn here and he was placed on vancomycin and cefepime.  The gram-negative rods that were growing in the blood of been identified as E.  coli that is actually a pansensitive species.  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  Additionally there is new paravertebral inflammation and rim enhancement fluid collections with a 2.7 x 2.1 cm on the left and 2.7 x 1.2 cm on the right.  Epidural assessment was limited by hardware and artifact.  Residual intradiscal fluid at T9-T10 is unchanged  There is posterior fusion from T6 to the sacrum and T5-T6 there is some new left subarticular disc extrusion.  Lumbar spine is postsurgical changes of thoracolumbar sacral fusion but no osteomyelitis or discitis.   We will narrow him back to cefazolin  Surgery to evaluate findings on scan.  Given he has no focal or neurological deficits I doubt that he would have neurosurgical intervention.  To me is clear that he has proven that he cannot come off oral antibiotics will likely need to be on these antibiotics for years if not lifelong) the latter would be my preference)  I have personally spent 90 minutes involved in face-to-face and non-face-to-face activities for this patient on the day of the visit. Professional time spent includes the following activities: Preparing to see the patient (review of tests), Obtaining and/or reviewing separately obtained history (admission/discharge record), Performing a medically appropriate examination and/or evaluation , Ordering medications/tests/procedures, referring and communicating with other health care professionals, Documenting clinical information in the EMR, Independently interpreting results (not separately reported),  Communicating results to the patient/family/caregiver, Counseling and educating the patient/family/caregiver and Care coordination (not separately reported).     Review of Systems: Review of Systems  Constitutional:  Positive for fever and malaise/fatigue. Negative for chills and weight loss.  HENT:  Negative for congestion and sore throat.   Eyes:  Negative for blurred vision and photophobia.  Respiratory:  Negative for cough, shortness of breath and wheezing.   Cardiovascular:  Negative for chest pain, palpitations and leg swelling.  Gastrointestinal:  Negative for abdominal pain, blood in stool, constipation, diarrhea, heartburn, melena, nausea and vomiting.  Genitourinary:  Negative for dysuria, flank pain and hematuria.  Musculoskeletal:  Positive for back pain. Negative for falls, joint pain and myalgias.  Skin:  Negative for itching and rash.  Neurological:  Positive for weakness. Negative for dizziness, focal weakness, loss of consciousness and headaches.  Endo/Heme/Allergies:  Does not bruise/bleed easily.  Psychiatric/Behavioral:  Negative for depression and suicidal ideas. The patient does not have insomnia.     Past Medical History:  Diagnosis Date   Arthritis    arthritis-bilateral knees, left Hip.   Degenerative lumbar spinal stenosis    EKG abnormality    06-08-16- being evaluated by cardiologist  in [place]   Idiopathic scoliosis of lumbar region  Joint stiffness    Knee pain    Leg swelling    Low back pain    Achy, shooting pain.  Hurts to Walk or stand up straight.   Lumbar radiculopathy    Scoliosis concern    Sleep apnea    Bipap use nightly 25/19 -3l/m oxygen   Spondylolisthesis of lumbar region    Transfusion history    [place] ,[place] after bleeding post colon polyp removal and colonscopy procedure    Social History   Tobacco Use   Smoking status: Never   Smokeless tobacco: Never  Vaping Use   Vaping Use: Never used  Substance Use Topics    Alcohol use: Not Currently    Comment: rare- social    Drug use: No    Family History  Problem Relation Age of Onset   Hypertension Mother    Arthritis Mother    Stomach cancer Father    Hypertension Brother    No Known Allergies  OBJECTIVE: Blood pressure 126/61, pulse 84, temperature 98 F (36.7 C), resp. rate 20, height 5\' 8"  (1.727 m), weight 131.5 kg, SpO2 (!) 89 %.  Physical Exam Constitutional:      Appearance: He is well-developed.  HENT:     Head: Normocephalic and atraumatic.  Eyes:     Conjunctiva/sclera: Conjunctivae normal.  Cardiovascular:     Rate and Rhythm: Normal rate and regular rhythm.  Pulmonary:     Effort: Pulmonary effort is normal. No respiratory distress.     Breath sounds: No wheezing.  Abdominal:     General: There is no distension.     Palpations: Abdomen is soft.  Musculoskeletal:        General: No tenderness. Normal range of motion.     Cervical back: Normal range of motion and neck supple.  Skin:    General: Skin is warm and dry.     Coloration: Skin is not pale.     Findings: No erythema or rash.  Neurological:     Mental Status: He is alert and oriented to person, place, and time.  Psychiatric:        Mood and Affect: Mood normal.        Behavior: Behavior normal.        Thought Content: Thought content normal.        Judgment: Judgment normal.    Incomplete paraplegia.  Lab Results Lab Results  Component Value Date   WBC 13.0 (H) 11/22/2022   HGB 12.6 (L) 11/22/2022   HCT 37.6 (L) 11/22/2022   MCV 87.9 11/22/2022   PLT 229 11/22/2022    Lab Results  Component Value Date   CREATININE 0.89 11/22/2022   BUN 14 11/22/2022   NA 132 (L) 11/22/2022   K 3.9 11/22/2022   CL 96 (L) 11/22/2022   CO2 25 11/22/2022    Lab Results  Component Value Date   ALT 28 11/20/2022   AST 30 11/20/2022   ALKPHOS 67 11/20/2022   BILITOT 0.6 11/20/2022     Microbiology: Recent Results (from the past 240 hour(s))  Resp panel by  RT-PCR (RSV, Flu A&B, Covid) Anterior Nasal Swab     Status: None   Collection Time: 11/20/22  4:14 PM   Specimen: Anterior Nasal Swab  Result Value Ref Range Status   SARS Coronavirus 2 by RT PCR NEGATIVE NEGATIVE Final   Influenza A by PCR NEGATIVE NEGATIVE Final   Influenza B by PCR NEGATIVE NEGATIVE Final    Comment: (  NOTE) The Xpert Xpress SARS-CoV-2/FLU/RSV plus assay is intended as an aid in the diagnosis of influenza from Nasopharyngeal swab specimens and should not be used as a sole basis for treatment. Nasal washings and aspirates are unacceptable for Xpert Xpress SARS-CoV-2/FLU/RSV testing.  Fact Sheet for Patients: BloggerCourse.com  Fact Sheet for Healthcare Providers: SeriousBroker.it  This test is not yet approved or cleared by the [place] FDA and has been authorized for detection and/or diagnosis of SARS-CoV-2 by FDA under an Emergency Use Authorization (EUA). This EUA will remain in effect (meaning this test can be used) for the duration of the COVID-19 declaration under Section 564(b)(1) of the Act, 21 U.S.C. section 360bbb-3(b)(1), unless the authorization is terminated or revoked.     Resp Syncytial Virus by PCR NEGATIVE NEGATIVE Final    Comment: (NOTE) Fact Sheet for Patients: BloggerCourse.com  Fact Sheet for Healthcare Providers: SeriousBroker.it  This test is not yet approved or cleared by the [place] FDA and has been authorized for detection and/or diagnosis of SARS-CoV-2 by FDA under an Emergency Use Authorization (EUA). This EUA will remain in effect (meaning this test can be used) for the duration of the COVID-19 declaration under Section 564(b)(1) of the Act, 21 U.S.C. section 360bbb-3(b)(1), unless the authorization is terminated or revoked.  Performed at [facility], [address], [place], [place] [address]   Blood  Culture (routine x 2)     Status: None (Preliminary result)   Collection Time: 11/20/22  4:14 PM   Specimen: BLOOD LEFT ARM  Result Value Ref Range Status   Specimen Description BLOOD LEFT ARM  Final   Special Requests   Final    BOTTLES DRAWN AEROBIC AND ANAEROBIC Blood Culture results may not be optimal due to an excessive volume of blood received in culture bottles   Culture   Final    NO GROWTH 2 DAYS Performed at [facility], [address], [place], [place] [address]    Report Status PENDING  Incomplete  Blood Culture (routine x 2)     Status: None (Preliminary result)   Collection Time: 11/20/22  8:19 PM   Specimen: BLOOD RIGHT ARM  Result Value Ref Range Status   Specimen Description BLOOD RIGHT ARM  Final   Special Requests   Final    BOTTLES DRAWN AEROBIC AND ANAEROBIC Blood Culture adequate volume   Culture   Final    NO GROWTH 2 DAYS Performed at [facility], [address] [place], [place] [address]    Report Status PENDING  Incomplete    Acey Lav, MD [facility] for Infectious Disease [facility] [phone] pager  11/22/2022, 12:41 PM

## 2022-11-22 NOTE — Progress Notes (Addendum)
HD#1 Subjective:   Summary: Marco Kennedy is a 72 y.o. 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    Patient is still noted with some slightly improved pain but he continues to have constipation since he started taking opiates.  He has not had a bowel movement about 5 days.  He denies any worsening abdominal pain but does feel that he gets full earlier.  He denies any cough or shortness of breath.  Objective:  Vital signs in last 24 hours: Vitals:   11/21/22 0741 11/21/22 1635 11/21/22 1949 11/22/22 0348  BP: 122/65 (!) 157/77 (!) 150/73 (!) 152/72  Pulse: 87 85 77 86  Resp: 18 20 16 16   Temp: 98 F (36.7 C)  100 F (37.8 C) 99.5 F (37.5 C)  TempSrc:   Oral   SpO2: 91% (!) 88% 91% 90%  Weight:      Height:       Supplemental O2: Room Air SpO2: 90 %   Physical Exam:  Constitutional: Well-appearing elderly male. In no acute distress. Cardio:Regular rate and rhythm. . 2+ bilateral radial and dorsalis pedis  pulses. Pulm: Normal work of breathing on room air. Abdomen: Soft, non-tender, mildly distended, positive bowel sounds. MSK: Trace lower extremity edema. Skin:Warm and dry. Neuro:Alert and oriented x3.  No focal deficits.    Filed Weights   11/20/22 1604  Weight: 131.5 kg      Intake/Output Summary (Last 24 hours) at 11/22/2022 5409 Last data filed at 11/22/2022 8119 Gross per 24 hour  Intake 800.42 ml  Output 1000 ml  Net -199.58 ml   Net IO Since Admission: 1,200.42 mL [11/22/22 0637]  Pertinent Labs:    Latest Ref Rng & Units 11/22/2022    4:41 AM 11/21/2022    5:41 AM 11/20/2022    4:15 PM  CBC  WBC 4.0 - 10.5 K/uL 13.0  11.3  11.1    Hemoglobin 13.0 - 17.0 g/dL 14.7  82.9  56.2   Hematocrit 39.0 - 52.0 % 37.6  39.9  39.0   Platelets 150 - 400 K/uL 229  237  215        Latest Ref Rng & Units 11/22/2022    4:41 AM 11/21/2022    5:41 AM 11/20/2022    4:15 PM  CMP  Glucose 70 - 99 mg/dL 130  865  784   BUN 8 - 23 mg/dL 14  17  21    Creatinine 0.61 - 1.24 mg/dL 6.96  2.95  2.84   Sodium 135 - 145 mmol/L 132  133  131   Potassium 3.5 - 5.1 mmol/L 3.9  3.5  3.6   Chloride 98 - 111 mmol/L 96  97  96   CO2 22 - 32 mmol/L 25  26  24    Calcium 8.9 - 10.3 mg/dL 7.9  8.1  8.2   Total Protein 6.5 - 8.1 g/dL   6.5   Total Bilirubin 0.3 - 1.2 mg/dL   0.6   Alkaline Phos 38 - 126 U/L   67   AST 15 - 41 U/L   30   ALT 0 - 44 U/L   28     Assessment/Plan:   Principal Problem:   Vertebral osteomyelitis, acute  recurrent (HCC) Active Problems:   Obesity, Class III, BMI 40-49.9 (morbid obesity) (HCC)   Discitis of thoracic region   Hypoalbuminemia due to protein-calorie malnutrition (HCC)   Bacteremia   Patient Summary: Marco Kennedy is a 72 y.o. male with hx of T2DM, hypertension, and degenerative lumbar spinal stenosis s/p extensive thoracal lumbar surgery with hardware in place complicated by vertebral osteomyelitis and paravertebral abscess in 2022 on chronic suppressive antibiotic therapy who presented with gram-negative rod bacteremia and is admitted for ID and neurosurgical evaluation, on hospital day 1.   # Gram-negative bacteremia # Recurrent vertebral osteomyelitis # Paravertebral abscesses Patient sent to [facility] for ID and neurosurgery eval due to positive gram-negative rods in blood culture from outside hospital.  Remains hemodynamically stable.  He is on broad-spectrum antibiotics and blood cultures here remain negative. MRI of the spine shows recurrent T10-T11 osteomyelitis discitis as well as bilateral paravertebral abscesses.  Discussed the case with ID who recommend discontinuation of vancomycin and will see  the patient later.  Culture and sensitivity from blood cultures on 11/19/2022 at [place] shows pansensitive E. coli. -Neurosurgery following, appreciate recs -Pending full ID eval -De-escalate to cefazolin -Oral oxycodone 10 mg every 6 hours as needed and IV Dilaudid 0.5 mg q4h prn for breakthrough pain -PT/OT  Constipation Has not had a bowel movement in about 5 days after starting opiates for his back pain.  He has not been taking any bowel regimen before coming to the hospital.  He still does not have a bowel movement so we will try Dulcolax suppository today in addition to MiraLAX and Senokot.  Also discussed enema and mag citrate.  If he does not have a bowel movement today we will consider Linzess to help with opiate induced constipation. - MiraLAX, Senokot 2 tablets nightly, Dulcolax suppository  Hypoxemia O2 sats have been between 90 and 92.  He does not have any shortness of breath or other respiratory symptoms.  He has been largely sedentary since his back pain started about a week ago.  This is most likely due to atelectasis and we will give him an incentive spirometer.  If he has any respiratory complaints would recommend repeat chest x-ray.   # HTN -Continue amlodipine 10 mg daily and carvedilol 3.125 mg twice daily   # T2DM (need confirmation; prediabetes listed in chart, A1c 5.9 in 2022) Repeat A1c still in process.  Blood sugars still elevated to the 170s today. -Continue SSI with meals -CBG monitoring   # AKI, resolved # Hypophosphatemia, improved Creatinine improved from 1.31 to 0.89 after IV fluids. Phosphate improved to 2.8. -Trend renal function panel  Diet: Carb-Modified VTE: Enoxaparin Code: Full  Dispo: Anticipated discharge in 2-3 days.  Rocky Morel, DO Internal Medicine Resident PGY-1 Pager: [phone]  Please contact the on call pager after 5 pm and on weekends at [phone].

## 2022-11-22 NOTE — Evaluation (Signed)
Occupational Therapy Evaluation Patient Details Name: Marco Kennedy MRN: [id number] DOB: [birth year] Today's Date: 11/22/2022   History of Present Illness Pt is a 72 y.o. male presenting 5/25 with LL back pain; was here last week and now has been instructed to report back as blood cultures taken were positive. MRI throracic spine demonstrating recurrent osteomyelitis. 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 Has been on antibiotic therapy since and recently discontinued them. PMH significant for DMII, HTN, HLD.   Clinical Impression   Marco Kennedy was evaluated s/p the above admission list. He is mod I with use of RW and active with OP PT at baseline. Upon evaluation he was limited by back pain and decreased activity tolerance. Overall he needs light min A for transfers form lower surfaces and min A for sit>supine transfer. Due to the deficits listed below he also needs up to mod A for LB ADLs due to pain and limited BLE ROM. Pt will benefit from continued acute OT services to facilitate d/c home with family.        Recommendations for follow up therapy are one component of a multi-disciplinary discharge planning process, led by the attending physician.  Recommendations may be updated based on patient status, additional functional criteria and insurance authorization.   Assistance Recommended at Discharge Frequent or constant Supervision/Assistance  Patient can return home with the following A little help with walking and/or transfers;A little help with bathing/dressing/bathroom;Assist for transportation;Help with stairs or ramp for entrance;Assistance with cooking/housework    Functional Status Assessment  Patient has had a recent decline in their functional status and demonstrates the ability to make significant improvements in function in a reasonable and predictable amount of time.   Equipment Recommendations  None recommended by OT       Precautions / Restrictions Precautions Precautions: Fall Restrictions Weight Bearing Restrictions: No      Mobility Bed Mobility Overal bed mobility: Needs Assistance Bed Mobility: Sit to Supine       Sit to supine: Min assist   General bed mobility comments: for LEs    Transfers Overall transfer level: Needs assistance Equipment used: Rolling walker (2 wheels) Transfers: Sit to/from Stand Sit to Stand: Min assist           General transfer comment: Min A from lower surfaces      Balance Overall balance assessment: Needs assistance Sitting-balance support: Feet supported Sitting balance-Leahy Scale: Fair     Standing balance support: Single extremity supported, During functional activity Standing balance-Leahy Scale: Fair             ADL either performed or assessed with clinical judgement   ADL Overall ADL's : Needs assistance/impaired Eating/Feeding: Independent   Grooming: Min guard;Standing   Upper Body Bathing: Set up;Sitting   Lower Body Bathing: Moderate assistance;Sit to/from stand   Upper Body Dressing : Set up;Sitting   Lower Body Dressing: Moderate assistance;Sit to/from stand   Toilet Transfer: Minimal assistance;Regular Toilet;Rolling walker (2 wheels)   Toileting- Clothing Manipulation and Hygiene: Min guard;Sitting/lateral lean       Functional mobility during ADLs: Minimal assistance;Rolling walker (2 wheels) General ADL Comments: light min A for STS from lower surface     Vision Baseline Vision/History: 1 Wears glasses Patient Visual Report: No change from baseline Vision Assessment?: No apparent visual deficits     Perception Perception Perception Tested?: No   Praxis Praxis Praxis tested?: Not tested  Pertinent Vitals/Pain Pain Assessment Pain Assessment: Faces Faces Pain Scale: Hurts little more Pain Location: back lower left Pain Descriptors /  Indicators: Discomfort, Guarding, Grimacing Pain Intervention(s): Monitored during session, Limited activity within patient's tolerance     Hand Dominance Right   Extremity/Trunk Assessment Upper Extremity Assessment Upper Extremity Assessment: Overall WFL for tasks assessed   Lower Extremity Assessment Lower Extremity Assessment: Defer to PT evaluation   Cervical / Trunk Assessment Cervical / Trunk Assessment: Other exceptions Cervical / Trunk Exceptions: ecurrent osteomyelitis of spine   Communication Communication Communication: No difficulties   Cognition Arousal/Alertness: Awake/alert Behavior During Therapy: WFL for tasks assessed/performed Overall Cognitive Status: Within Functional Limits for tasks assessed             General Comments  VSS on RA            Home Living Family/patient expects to be discharged to:: Private residence Living Arrangements: Spouse/significant other;Parent (mom lives with pt) Available Help at Discharge: Family;Available PRN/intermittently Type of Home: House Home Access: Ramped entrance     Home Layout: One level     Bathroom Shower/Tub: Producer, television/film/video: Handicapped height     Home Equipment: Rollator (4 wheels);Rolling Walker (2 wheels);Tub bench;Shower seat;Wheelchair - manual;Cane - single point;BSC/3in1;Grab bars - toilet;Grab bars - tub/shower;Hand held shower head;Adaptive equipment Adaptive Equipment: Reacher;Sock aid;Long-handled shoe horn Additional Comments: Elderly mom lives with pt and uses rollator.  Wife works partially at home and partially in office. Pt and his mom use rollators in the home.  Pt drove to PT this past week and takes RW when outdoors      Prior Functioning/Environment Prior Level of Function : Independent/Modified Independent;Driving             Mobility Comments: Used rollator indoors and RW outdoors - was walking 79ft w/o device with PT ADLs Comments: wife assists  with compression socks and shoes - otherwise pt is indep        OT Problem List: Decreased strength;Decreased range of motion;Decreased activity tolerance;Pain      OT Treatment/Interventions: Self-care/ADL training;Therapeutic exercise;DME and/or AE instruction;Therapeutic activities;Balance training;Patient/family education    OT Goals(Current goals can be found in the care plan section) Acute Rehab OT Goals Patient Stated Goal: less pain OT Goal Formulation: With patient Time For Goal Achievement: 12/06/22 Potential to Achieve Goals: Good ADL Goals Pt Will Perform Grooming: with modified independence;standing Pt Will Perform Lower Body Dressing: with set-up;sit to/from stand Pt Will Transfer to Toilet: with modified independence;ambulating Pt Will Perform Toileting - Clothing Manipulation and hygiene: with modified independence;sit to/from stand;sitting/lateral leans  OT Frequency: Min 2X/week       AM-PAC OT "6 Clicks" Daily Activity     Outcome Measure Help from another person eating meals?: None Help from another person taking care of personal grooming?: A Little Help from another person toileting, which includes using toliet, bedpan, or urinal?: A Little Help from another person bathing (including washing, rinsing, drying)?: A Lot Help from another person to put on and taking off regular upper body clothing?: A Little Help from another person to put on and taking off regular lower body clothing?: A Lot 6 Click Score: 17   End of Session Equipment Utilized During Treatment: Gait belt;Rolling walker (2 wheels) Nurse Communication: Mobility status  Activity Tolerance: Patient tolerated treatment well Patient left: in bed;with call bell/phone within reach;with bed alarm set  OT Visit Diagnosis: Unsteadiness on feet (R26.81);Other abnormalities of gait and mobility (R26.89);Muscle  weakness (generalized) (M62.81);Pain                Time: 1259-1315 OT Time Calculation  (min): 16 min Charges:  OT General Charges $OT Visit: 1 Visit OT Evaluation $OT Eval Moderate Complexity: 1 Mod  Derenda Mis, OTR/L Acute Rehabilitation Services Office [phone] Secure Chat Communication Preferred   Donia Pounds 11/22/2022, 2:00 PM

## 2022-11-22 NOTE — Evaluation (Signed)
Physical Therapy Evaluation Patient Details Name: Marco Kennedy MRN: [id number] DOB: March 05, 1951 Today's Date: 11/22/2022  History of Present Illness  Pt is a 72 y.o. male presenting 5/25 with LL back pain; was here last week and now has been instructed to report back as blood cultures taken were positive. MRI throracic spine demonstrating recurrent osteomyelitis. 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 Has been on antibiotic therapy since and recently discontinued them. PMH significant for DMII, HTN, HLD.  Clinical Impression  Pt admitted with above diagnosis. Pt was able to ambulate with RW with overall good safety.  Limited by back pain. Pt should progress well.  May need back surgery and pt awaits MD input regarding this.  Will follow acutely.  Pt currently with functional limitations due to the deficits listed below (see PT Problem List). Pt will benefit from acute skilled PT to increase their independence and safety with mobility to allow discharge.          Recommendations for follow up therapy are one component of a multi-disciplinary discharge planning process, led by the attending physician.  Recommendations may be updated based on patient status, additional functional criteria and insurance authorization.  Follow Up Recommendations       Assistance Recommended at Discharge Intermittent Supervision/Assistance  Patient can return home with the following  A little help with walking and/or transfers;A little help with bathing/dressing/bathroom;Assistance with cooking/housework;Assist for transportation;Help with stairs or ramp for entrance    Equipment Recommendations None recommended by PT  Recommendations for Other Services       Functional Status Assessment Patient has had a recent decline in their functional status and demonstrates the ability to make significant  improvements in function in a reasonable and predictable amount of time.     Precautions / Restrictions Precautions Precautions: Fall Restrictions Weight Bearing Restrictions: No      Mobility  Bed Mobility Overal bed mobility: Needs Assistance Bed Mobility: Rolling, Sidelying to Sit Rolling: Min guard Sidelying to sit: Min guard       Marco bed mobility comments: No Assist needed to come to EOB with pt using log roll without cues    Transfers Overall transfer level: Needs assistance Equipment used: Rolling walker (2 wheels) Transfers: Sit to/from Stand Sit to Stand: Min assist           Marco transfer comment: Min A from lower surfaces    Ambulation/Gait Ambulation/Gait assistance: Min guard Gait Distance (Feet): 25 Feet Assistive device: Rolling walker (2 wheels) Gait Pattern/deviations: Step-through pattern, Decreased stride length, Decreased step length - right, Drifts right/left, Trunk flexed, Wide base of support   Gait velocity interpretation: <1.31 ft/sec, indicative of household ambulator   Marco Gait Details: Pt able to walk around bed with use of RW however slow gait and with flexed posture. Pt limited distance as he says the pain will get to him if he overdoes it.  Overall good safety with the RW.  Stairs            Wheelchair Mobility    Modified Rankin (Stroke Patients Only)       Balance Overall balance assessment: Needs assistance Sitting-balance support: Feet supported Sitting balance-Leahy Scale: Fair     Standing balance support: Single extremity supported, During functional activity, Bilateral upper extremity supported, Reliant on assistive device for balance Standing balance-Leahy Scale: Poor Standing balance comment: relies on RW for support  Pertinent Vitals/Pain Pain Assessment Pain Assessment: Faces Faces Pain Scale: Hurts little more Pain Location: back lower left Pain  Descriptors / Indicators: Discomfort, Guarding, Grimacing Pain Intervention(s): Limited activity within patient's tolerance, Monitored during session, Repositioned    Home Living Family/patient expects to be discharged to:: Private residence Living Arrangements: Spouse/significant other;Parent (mom lives with pt) Available Help at Discharge: Family;Available PRN/intermittently Type of Home: House Home Access: Ramped entrance       Home Layout: One level Home Equipment: Rollator (4 wheels);Rolling Walker (2 wheels);Tub bench;Shower seat;Wheelchair - manual;Cane - single point;BSC/3in1;Grab bars - toilet;Grab bars - tub/shower;Hand held shower head;Adaptive equipment Additional Comments: Elderly mom lives with pt and uses rollator.  Wife works partially at home and partially in office. Pt and his mom use rollators in the home.  Pt drove to PT this past week and takes RW when outdoors    Prior Function Prior Level of Function : Independent/Modified Independent;Driving             Mobility Comments: Used rollator indoors and RW outdoors - was walking 84ft w/o device with PT ADLs Comments: wife assists with compression socks and shoes - otherwise pt is indep     Hand Dominance   Dominant Hand: Right    Extremity/Trunk Assessment   Upper Extremity Assessment Upper Extremity Assessment: Defer to OT evaluation    Lower Extremity Assessment Lower Extremity Assessment: RLE deficits/detail;LLE deficits/detail RLE Deficits / Details: grossly 3+/5 RLE Sensation: decreased light touch;decreased proprioception;history of peripheral neuropathy LLE Deficits / Details: grossly 3+/5 LLE Sensation: decreased light touch;decreased proprioception;history of peripheral neuropathy    Cervical / Trunk Assessment Cervical / Trunk Assessment: Other exceptions Cervical / Trunk Exceptions: recurrent osteomyelitis of spine  Communication   Communication: No difficulties  Cognition  Arousal/Alertness: Awake/alert Behavior During Therapy: WFL for tasks assessed/performed Overall Cognitive Status: Within Functional Limits for tasks assessed                                          Marco Comments Marco comments (skin integrity, edema, etc.): VSS    Exercises Marco Exercises - Lower Extremity Ankle Circles/Pumps: AROM, 5 reps, Both, Seated Long Arc Quad: AROM, Both, 10 reps, Seated   Assessment/Plan    PT Assessment Patient needs continued PT services  PT Problem List Decreased activity tolerance;Decreased balance;Decreased mobility;Decreased knowledge of use of DME;Decreased safety awareness;Decreased knowledge of precautions;Pain       PT Treatment Interventions DME instruction;Gait training;Functional mobility training;Therapeutic activities;Therapeutic exercise;Balance training;Patient/family education    PT Goals (Current goals can be found in the Care Plan section)  Acute Rehab PT Goals Patient Stated Goal: to go home PT Goal Formulation: With patient Time For Goal Achievement: 12/06/22 Potential to Achieve Goals: Good    Frequency Min 5X/week     Co-evaluation               AM-PAC PT "6 Clicks" Mobility  Outcome Measure Help needed turning from your back to your side while in a flat bed without using bedrails?: None Help needed moving from lying on your back to sitting on the side of a flat bed without using bedrails?: None Help needed moving to and from a bed to a chair (including a wheelchair)?: A Little Help needed standing up from a chair using your arms (e.g., wheelchair or bedside chair)?: A Little Help needed to walk in hospital room?: A  Little Help needed climbing 3-5 steps with a railing? : A Lot 6 Click Score: 19    End of Session Equipment Utilized During Treatment: Gait belt Activity Tolerance: Patient limited by fatigue;Patient limited by pain Patient left: in chair;with call bell/phone within  reach;with chair alarm set Nurse Communication: Mobility status PT Visit Diagnosis: Muscle weakness (generalized) (M62.81);Pain Pain - part of body:  (BAck)    Time: 1610-9604 PT Time Calculation (min) (ACUTE ONLY): 32 min   Charges:   PT Evaluation $PT Eval Moderate Complexity: 1 Mod PT Treatments $Gait Training: 8-22 mins        [facility] M,PT Acute Rehab Services [phone]   Bevelyn Buckles 11/22/2022, 2:27 PM

## 2022-11-22 NOTE — Progress Notes (Signed)
[name], male   DOB: [birth year], 72 y.o.   MRN: [id number] Patient is awake and alert.  Complains of back pain.  Review of his MRI demonstrates that he has evidence of discitis at the T10-T11 level which has been previously involved.  Hopefully this has been caught early enough that antibiotic treatment will resolve the situation.  He is been started on the appropriate antibiotics.  I have advised that the patient should likely continue the antibiotics long-term, that is for the rest of his life.

## 2022-11-22 NOTE — TOC Initial Note (Signed)
Transition of Care [facility]) - Initial/Assessment Note    Patient Details  Name: Marco Kennedy MRN: [id number] Date of Birth: October 04, 1950  [facility]) CM/SW Contact:    Ronny Bacon, RN Phone Number: 11/22/2022, 10:25 AM  Clinical Narrative:  Spoke with patient at bedside. Patient from [place], lives at home with spouse and elderly mother. Current DME; RW, Rollator, Steady, Shower bench, and tub rails. Patient in process of remodeling house bought from mother in law, creating handicap accessible home. Patient has HH services through "[facility] [place] Patient has PCP, Dr. Molly Maduro in [place]. Family will transport patient home at time of discharge.                  Expected Discharge Plan: Home w Home Health Services Barriers to Discharge: No Barriers Identified   Patient Goals and CMS Choice Patient states their goals for this hospitalization and ongoing recovery are:: To go home          Expected Discharge Plan and Services   Discharge Planning Services: CM Consult Post Acute Care Choice: Home Health Living arrangements for the past 2 months: Single Family Home                   DME Agency: NA                  Prior Living Arrangements/Services Living arrangements for the past 2 months: Single Family Home Lives with:: Parents, Spouse Patient language and need for interpreter reviewed:: Yes Do you feel safe going back to the place where you live?: Yes      Need for Family Participation in Patient Care: No (Comment) Care giver support system in place?: Yes (comment) Current home services: DME, Home PT, Home OT (RW, Rollator, Steady, WC, shower bench, tub rails) Criminal Activity/Legal Involvement Pertinent to Current Situation/Hospitalization: No - Comment as needed  Activities of Daily Living Home Assistive Devices/Equipment: None ADL Screening (condition at time of admission) Patient's cognitive ability adequate to safely complete daily  activities?: Yes Is the patient deaf or have difficulty hearing?: No Does the patient have difficulty seeing, even when wearing glasses/contacts?: No Does the patient have difficulty concentrating, remembering, or making decisions?: No Patient able to express need for assistance with ADLs?: Yes Does the patient have difficulty dressing or bathing?: Yes Independently performs ADLs?: Yes (appropriate for developmental age) Does the patient have difficulty walking or climbing stairs?: No Weakness of Legs: None Weakness of Arms/Hands: None  Permission Sought/Granted Permission sought to share information with : Case Manager                Emotional Assessment Appearance:: Appears stated age Attitude/Demeanor/Rapport: Engaged Affect (typically observed): Appropriate Orientation: : Oriented to Self, Oriented to Place, Oriented to  Time, Oriented to Situation Alcohol / Substance Use: Not Applicable Psych Involvement: No (comment)  Admission diagnosis:  Bacteremia [R78.81] Patient Active Problem List   Diagnosis Date Noted   Bacteremia 11/20/2022   Neurogenic bladder 07/05/2022   Abnormality of gait 07/05/2022   Bilateral foot-drop 12/04/2021   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   Incomplete paraplegia (HCC) 05/20/2021   Bilateral leg edema 05/20/2021   Drug induced constipation    Pressure injury of skin 04/14/2021   Hypoalbuminemia due to protein-calorie malnutrition (HCC)    Essential hypertension    Acute blood loss anemia    Labile blood pressure    Postoperative pain    Discitis of thoracic region    Epidural abscess  03/14/2021   Debility    Prediabetes    Constipation by delayed colonic transit    Constipation    Hyponatremia    Acute on chronic anemia    Retroperitoneal fluid collection    Pseudogout of knee, right 02/03/2021   Sepsis (HCC) 02/03/2021   Sepsis due to Escherichia coli (E. coli) (HCC) 02/01/2021   Empyema (HCC)    Anasarca 01/20/2021   OSA  (obstructive sleep apnea) 01/20/2021   Obesity, Class III, BMI 40-49.9 (morbid obesity) (HCC) 01/20/2021   Pleural effusion    Vertebral osteomyelitis, acute recurrent (HCC)    Hardware complicating wound infection (HCC)    Abscess of back 01/11/2021   Lumbar scoliosis 04/18/2020   Back pain 12/25/2019   S/P left THA, AA 06/15/2016   PCP:  Maximiano Coss, MD Pharmacy:   [facility] [phone] - [place], [place] - [id number] [place] [address] Phone: [phone] Fax: [phone]  [facility] - [place], [place] - [address] [place] [address] Phone: [phone] Fax: [phone]     Social Determinants of Health (SDOH) Social History: SDOH Screenings   Food Insecurity: No Food Insecurity (11/20/2022)  Housing: Low Risk  (11/20/2022)  Transportation Needs: No Transportation Needs (11/20/2022)  Utilities: Not At Risk (11/20/2022)  Depression (PHQ2-9): Low Risk  (11/05/2022)  Tobacco Use: Low Risk  (11/20/2022)   SDOH Interventions:     Readmission Risk Interventions     No data to display

## 2022-11-22 NOTE — Progress Notes (Signed)
Pharmacy Antibiotic Note  Marco Kennedy is a 72 y.o. male admitted on 11/20/2022 with bacteremia.  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 This was complicated by vertebral osteomyelitis and epidural abscess in 2022 and has been on cefadroxil suppressive therapy since. On 11/05/2022 this was stopped per ID. Patient developed fever after this and ID recommended re-starting cefadroxil. Patient then went to [place] on 11/19/2022 where blood cultures have reportedly resulted with pansensitive E.coli. Scr 0.89 and CrCl 99 ml/min. WBC 13 and Tmax 100 F. Antibiotics deescalated from vancomycin/cefepime to cefazolin based on sensitivities at [place]. Pharmacy consulted to dose cefazolin.  Plan: Start cefazolin 2 g IV Q8H F/u cultures, clinical course, WBC, length of therapy F/u ID recommendations  Height: 5\' 8"  (172.7 cm) Weight: 131.5 kg (290 lb) IBW/kg (Calculated) : 68.4  Temp (24hrs), Avg:99.2 F (37.3 C), Min:98 F (36.7 C), Max:100 F (37.8 C)  Recent Labs  Lab 11/20/22 1615 11/20/22 1616 11/20/22 2019 11/21/22 0541 11/22/22 0441  WBC 11.1*  --   --  11.3* 13.0*  CREATININE 1.31*  --   --  0.94 0.89  LATICACIDVEN  --  2.0* 1.9  --   --      Estimated Creatinine Clearance: 99.3 mL/min (by C-G formula based on SCr of 0.89 mg/dL).    No Known Allergies  Antimicrobials this admission: Cefepime 5/26 >> 5/27 Vancomycin 5/26 >> 5/26 Cefazolin 5/27 >>  Dose adjustments this admission: 5/26 Increase vancomycin from 1500 mg q24h to 2000 mg q24h with improving renal function  Microbiology results: 5/25 BCx: no growth x2 days 5/24 BCx [place]: pansensitive E.coli  Thank you for allowing pharmacy to be a part of this patient's care.  Georga Hacking 11/22/2022 12:29 PM

## 2022-11-23 ENCOUNTER — Other Ambulatory Visit: Payer: Self-pay

## 2022-11-23 DIAGNOSIS — G061 Intraspinal abscess and granuloma: Secondary | ICD-10-CM

## 2022-11-23 DIAGNOSIS — E1169 Type 2 diabetes mellitus with other specified complication: Secondary | ICD-10-CM | POA: Diagnosis not present

## 2022-11-23 DIAGNOSIS — M4624 Osteomyelitis of vertebra, thoracic region: Secondary | ICD-10-CM | POA: Diagnosis not present

## 2022-11-23 DIAGNOSIS — R7881 Bacteremia: Secondary | ICD-10-CM | POA: Diagnosis not present

## 2022-11-23 DIAGNOSIS — M4620 Osteomyelitis of vertebra, site unspecified: Secondary | ICD-10-CM

## 2022-11-23 DIAGNOSIS — K5900 Constipation, unspecified: Secondary | ICD-10-CM

## 2022-11-23 DIAGNOSIS — R0902 Hypoxemia: Secondary | ICD-10-CM

## 2022-11-23 LAB — HEMOGLOBIN A1C
Hgb A1c MFr Bld: 6.9 % — ABNORMAL HIGH (ref 4.8–5.6)
Mean Plasma Glucose: 151 mg/dL

## 2022-11-23 LAB — CULTURE, BLOOD (ROUTINE X 2)

## 2022-11-23 LAB — CBC WITH DIFFERENTIAL/PLATELET
Abs Immature Granulocytes: 0.32 10*3/uL — ABNORMAL HIGH (ref 0.00–0.07)
Basophils Absolute: 0.1 10*3/uL (ref 0.0–0.1)
Basophils Relative: 1 %
Eosinophils Absolute: 0.2 10*3/uL (ref 0.0–0.5)
Eosinophils Relative: 1 %
HCT: 37.5 % — ABNORMAL LOW (ref 39.0–52.0)
Hemoglobin: 12.8 g/dL — ABNORMAL LOW (ref 13.0–17.0)
Immature Granulocytes: 2 %
Lymphocytes Relative: 5 %
Lymphs Abs: 0.8 10*3/uL (ref 0.7–4.0)
MCH: 30 pg (ref 26.0–34.0)
MCHC: 34.1 g/dL (ref 30.0–36.0)
MCV: 87.8 fL (ref 80.0–100.0)
Monocytes Absolute: 1.7 10*3/uL — ABNORMAL HIGH (ref 0.1–1.0)
Monocytes Relative: 11 %
Neutro Abs: 13.4 10*3/uL — ABNORMAL HIGH (ref 1.7–7.7)
Neutrophils Relative %: 80 %
Platelets: 230 10*3/uL (ref 150–400)
RBC: 4.27 MIL/uL (ref 4.22–5.81)
RDW: 13.3 % (ref 11.5–15.5)
WBC: 16.5 10*3/uL — ABNORMAL HIGH (ref 4.0–10.5)
nRBC: 0 % (ref 0.0–0.2)

## 2022-11-23 LAB — RENAL FUNCTION PANEL
Albumin: 2.4 g/dL — ABNORMAL LOW (ref 3.5–5.0)
Anion gap: 15 (ref 5–15)
BUN: 14 mg/dL (ref 8–23)
CO2: 21 mmol/L — ABNORMAL LOW (ref 22–32)
Calcium: 8 mg/dL — ABNORMAL LOW (ref 8.9–10.3)
Chloride: 95 mmol/L — ABNORMAL LOW (ref 98–111)
Creatinine, Ser: 0.83 mg/dL (ref 0.61–1.24)
GFR, Estimated: >60 mL/min (ref >60)
Glucose, Bld: 172 mg/dL — ABNORMAL HIGH (ref 70–99)
Phosphorus: 2.6 mg/dL (ref 2.5–4.6)
Potassium: 3.6 mmol/L (ref 3.5–5.1)
Sodium: 131 mmol/L — ABNORMAL LOW (ref 135–145)

## 2022-11-23 LAB — GLUCOSE, CAPILLARY
Glucose-Capillary: 175 mg/dL — ABNORMAL HIGH (ref 70–99)
Glucose-Capillary: 164 mg/dL — ABNORMAL HIGH (ref 70–99)
Glucose-Capillary: 153 mg/dL — ABNORMAL HIGH (ref 70–99)
Glucose-Capillary: 196 mg/dL — ABNORMAL HIGH (ref 70–99)
Glucose-Capillary: 169 mg/dL — ABNORMAL HIGH (ref 70–99)
Glucose-Capillary: 138 mg/dL — ABNORMAL HIGH (ref 70–99)

## 2022-11-23 MED ORDER — CEFAZOLIN IV (FOR PTA / DISCHARGE USE ONLY): 2.0000 g | Freq: Three times a day (TID) | INTRAVENOUS | 0 refills | Status: DC

## 2022-11-23 MED ORDER — POLYETHYLENE GLYCOL 3350 17 G PO PACK: 17.0000 g | PACK | Freq: Every day | ORAL | 0 refills | Status: DC

## 2022-11-23 MED ORDER — SODIUM CHLORIDE 0.9% FLUSH: 10.0000 mL | INTRAVENOUS | Status: DC | PRN

## 2022-11-23 MED ORDER — BISACODYL 5 MG PO TBEC
10.0000 mg | DELAYED_RELEASE_TABLET | Freq: Once | ORAL | Status: AC
  Administered 2022-11-23: 10 mg via ORAL
  Filled 2022-11-23: qty 2

## 2022-11-23 MED ORDER — BISACODYL 5 MG PO TBEC: 5.0000 mg | DELAYED_RELEASE_TABLET | Freq: Every day | ORAL | 0 refills | Status: DC | PRN

## 2022-11-23 MED ORDER — SODIUM CHLORIDE 0.9% FLUSH
10.0000 mL | Freq: Two times a day (BID) | INTRAVENOUS | Status: DC
  Administered 2022-11-23 – 2022-11-24 (×3): 10 mL

## 2022-11-23 MED ORDER — OXYCODONE HCL 5 MG PO TABS: 5.0000 mg | ORAL_TABLET | Freq: Four times a day (QID) | ORAL | 0 refills | Status: DC | PRN

## 2022-11-23 MED ORDER — OXYCODONE HCL 5 MG PO TABS: 5.0000 mg | ORAL_TABLET | Freq: Four times a day (QID) | ORAL | Status: DC | PRN

## 2022-11-23 MED ORDER — CHLORHEXIDINE GLUCONATE CLOTH 2 % EX PADS
6.0000 | MEDICATED_PAD | Freq: Every day | CUTANEOUS | Status: DC
  Administered 2022-11-23: 6 via TOPICAL

## 2022-11-23 NOTE — Progress Notes (Signed)
Physical Therapy Treatment Patient Details Name: Marco Kennedy MRN: [id number] DOB: 1950/08/01 Today's Date: 11/23/2022   History of Present Illness Pt is a 72 y.o. male presenting 5/25 with LL back pain; was here last week and now has been instructed to report back as blood cultures taken were positive. MRI throracic spine demonstrating recurrent osteomyelitis. 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 Has been on antibiotic therapy since and recently discontinued them. PMH significant for DMII, HTN, HLD.    PT Comments    Pt reports back pain, but agreeable to OOB mobility. Pt demonstrating good mobility progression, ambulating in hallway with use of RW. Pt eager to d/c home, plans to d/c tomorrow. Plan for HHPT.     Recommendations for follow up therapy are one component of a multi-disciplinary discharge planning process, led by the attending physician.  Recommendations may be updated based on patient status, additional functional criteria and insurance authorization.  Follow Up Recommendations       Assistance Recommended at Discharge Intermittent Supervision/Assistance  Patient can return home with the following A little help with walking and/or transfers;A little help with bathing/dressing/bathroom;Assistance with cooking/housework;Assist for transportation;Help with stairs or ramp for entrance   Equipment Recommendations  None recommended by PT    Recommendations for Other Services       Precautions / Restrictions Precautions Precautions: Fall Restrictions Weight Bearing Restrictions: No     Mobility  Bed Mobility Overal bed mobility: Needs Assistance Bed Mobility: Rolling, Sidelying to Sit, Sit to Sidelying Rolling: Supervision Sidelying to sit: Supervision     Sit to sidelying: Supervision General bed mobility comments: for safety, increased time and use  of bedrails    Transfers Overall transfer level: Needs assistance Equipment used: Rolling walker (2 wheels) Transfers: Sit to/from Stand Sit to Stand: Min assist           General transfer comment: light power up assist    Ambulation/Gait Ambulation/Gait assistance: Min guard Gait Distance (Feet): 150 Feet Assistive device: Rolling walker (2 wheels) Gait Pattern/deviations: Step-through pattern, Decreased stride length, Trunk flexed, Wide base of support Gait velocity: decr     General Gait Details: cues for placement in RW and upright posture   Stairs             Wheelchair Mobility    Modified Rankin (Stroke Patients Only)       Balance Overall balance assessment: Needs assistance Sitting-balance support: Feet supported Sitting balance-Leahy Scale: Fair     Standing balance support: Single extremity supported, During functional activity, Bilateral upper extremity supported, Reliant on assistive device for balance Standing balance-Leahy Scale: Poor Standing balance comment: relies on RW for support                            Cognition Arousal/Alertness: Awake/alert Behavior During Therapy: WFL for tasks assessed/performed Overall Cognitive Status: Within Functional Limits for tasks assessed                                          Exercises      General Comments        Pertinent Vitals/Pain Pain Assessment Pain Assessment: Faces Faces Pain Scale: Hurts little more Pain Location: back Pain Descriptors / Indicators: Discomfort, Guarding, Grimacing Pain Intervention(s): Limited activity within  patient's tolerance, Monitored during session, Repositioned    Home Living                          Prior Function            PT Goals (current goals can now be found in the care plan section) Acute Rehab PT Goals Patient Stated Goal: to go home PT Goal Formulation: With patient Time For Goal Achievement:  12/06/22 Potential to Achieve Goals: Good Progress towards PT goals: Progressing toward goals    Frequency    Min 5X/week      PT Plan Current plan remains appropriate    Co-evaluation              AM-PAC PT "6 Clicks" Mobility   Outcome Measure  Help needed turning from your back to your side while in a flat bed without using bedrails?: None Help needed moving from lying on your back to sitting on the side of a flat bed without using bedrails?: A Little Help needed moving to and from a bed to a chair (including a wheelchair)?: A Little Help needed standing up from a chair using your arms (e.g., wheelchair or bedside chair)?: A Little Help needed to walk in hospital room?: A Little Help needed climbing 3-5 steps with a railing? : A Little 6 Click Score: 19    End of Session Equipment Utilized During Treatment: Gait belt Activity Tolerance: Patient limited by fatigue;Patient limited by pain Patient left: in chair;with call bell/phone within reach;with chair alarm set Nurse Communication: Mobility status PT Visit Diagnosis: Muscle weakness (generalized) (M62.81);Pain     Time: 1535-1558 PT Time Calculation (min) (ACUTE ONLY): 23 min  Charges:  $Therapeutic Activity: 8-22 mins                     Marye Round, PT DPT Acute Rehabilitation Services Secure Chat Preferred  Office [phone]     Silvester Reierson Sheliah Plane 11/23/2022, 4:59 PM

## 2022-11-23 NOTE — Progress Notes (Signed)
[facility]  Date of Admission:  11/20/2022        Abx: 5/25-c cefazolin  Cefadroxil up until 11/05/22  ASSESSMENT: 72 yo male well known to me, hx t10 to sacral spinal fusion 03/2020, complicated by late (12/2020) T7-T11 epidural abscess/vertebral OM requiring I&D 01/13/2021 (cx ecoli; blood cx negative), repeated admissions for complications including bilateral empyema/retroperitoneal abscess s/p drainage (fluid cx negative) presumed "spill over" from vertebral process (blood cx negative; tte 01/21/21 no evidence valve vegetation), right knee effusion with negative fluid cx 01/28/2021, another I&D 03/14/2021 & 03/31/2021 and more hardware stabilization for worsening epidural abscess at T9-11 level (operative cx and bcx negative), who done well with 20 months of antibiotics suppression, but unfortunately relapsed after 10 days of trial off abx starting 11/04/2021 (fever, e coli bacteremia, 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 He has been walking with front wheel walker since recovered from this process  Culture: 11/19/22 OSH blood cx ecoli (S cefazolin, cipro, bactrim)  11/20/22 blood cx negative 03/14/2021, 03/31/2021 surgical (spinal) fluid swab cx negative 03/14/2021 bcx negative 01/28/21 right knee fluid cx negative 01/26/21 bcx negative 01/20/21, 01/23/21 bcx negative 01/20/21 right and left pleural fluid cx negative 01/13/21 thoracolumbar operative fluid cx ecoli (S cefazolin, cipro, bactrim) 01/10/21 bcx negative   He had sepsis and worsening back pain about 10 days after stopping cefadroxil. Seen initially in [place], [place] ER and blood cx there was positive for ecoli  Of note, the CLSI had updated cefazolin mic to be </=2 but the cards from both [place] and [place], [place] has MIC</=4 (can't go below that) and sensitivity is presumed for cefazolin  5/26  mri of the entire spine does show new paravertebral 2.7 fluid collection and recurrent t9-11 discitis/om. His repeat 11/20/22 blood cx here so far is negative  Neurosurgery had evaluated and given no new lower extremities neurological deficit or hardware failure, no plan to do any surgery at this time.   For now, agree given relative lack of concerning symptomatology and presence of small fluid collection, would do antibiotics and monitor   As he had failed a trial off abx rather quickly, will commit him to life-long suppression antibiotics treatment       PLAN: Picc placement today Will plan 6 weeks iv cefazolin -- with potential to stop a little early and transition to cefadroxil if things go well Repeat mri planned for 3-4 weeks from now of the thoracic spine when seen in id clinic He does have id clinic f/u with me on 6/13 @ 1030am Ok to discharge from id standpoint when ready per primary team; noted nsg had ok'ed for outpatient follow up as well Discussed with primary team   OPAT Orders Discharge antibiotics to be given via PICC line Discharge antibiotics: Cefazolin 2 gram iv q8hrs  Duration: 6 weeks  End Date: 01/01/2023  [facility] Care Per Protocol:  Home health RN for IV administration and teaching; PICC line care and labs.    Labs weekly while on IV antibiotics: _x_ CBC with differential __ BMP _x_ CMP _x_ CRP __ ESR __ Vancomycin trough __ CK  _x_ Please pull PIC at completion of IV antibiotics __ Please leave PIC in place until doctor has seen patient or been notified  Fax weekly labs to [phone]  Clinic Follow Up Appt: 6/13 @ 1030  @  [facility] [address]  [place], [place] [address] Phone: [phone]   Principal Problem:   Vertebral osteomyelitis, acute recurrent (HCC) Active Problems:   Obesity, Class III, BMI 40-49.9 (morbid obesity) (HCC)   Discitis of thoracic region   Hypoalbuminemia due to protein-calorie malnutrition  (HCC)   Bacteremia   No Known Allergies  Scheduled Meds:  acetaminophen  650 mg Oral Q8H   amLODipine  10 mg Oral Daily   carvedilol  3.125 mg Oral BID WC   DULoxetine  60 mg Oral QHS   enoxaparin (LOVENOX) injection  60 mg Subcutaneous Q24H   insulin aspart  0-15 Units Subcutaneous Q4H   polyethylene glycol  17 g Oral Daily   pravastatin  20 mg Oral Daily   senna-docusate  2 tablet Oral QHS   Continuous Infusions:   ceFAZolin (ANCEF) IV Stopped (11/23/22 0502)   PRN Meds:.HYDROmorphone (DILAUDID) injection, ipratropium-albuterol, oxyCODONE   SUBJECTIVE: Back pain stable Afebrile Bcx ngtd Reviewed chart including imaging/labs and discussed plan with patient  Review of Systems: ROS All other ROS was negative, except mentioned above     OBJECTIVE: Vitals:   11/22/22 1953 11/22/22 1955 11/23/22 0402 11/23/22 0733  BP: 139/72 139/72 134/67 109/78  Pulse: 87 87 83 77  Resp:  18 18   Temp:  99 F (37.2 C) 98.2 F (36.8 C) 98.7 F (37.1 C)  TempSrc:      SpO2: 93% 93% 94% 91%  Weight:      Height:       Body mass index is 44.09 kg/m.  Physical Exam  General/constitutional: no distress, pleasant HEENT: Normocephalic, PER, Conj Clear, EOMI, Oropharynx clear Neck supple CV: rrr no mrg Lungs: clear to auscultation, normal respiratory effort Abd: Soft, Nontender Ext: no edema Skin: No Rash Neuro: nonfocal MSK: stable bilateral LE 4-5/5 strength    Lab Results Lab Results  Component Value Date   WBC 16.5 (H) 11/23/2022   HGB 12.8 (L) 11/23/2022   HCT 37.5 (L) 11/23/2022   MCV 87.8 11/23/2022   PLT 230 11/23/2022    Lab Results  Component Value Date   CREATININE 0.83 11/23/2022   BUN 14 11/23/2022   NA 131 (L) 11/23/2022   K 3.6 11/23/2022   CL 95 (L) 11/23/2022   CO2 21 (L) 11/23/2022    Lab Results  Component Value Date   ALT 28 11/20/2022   AST 30 11/20/2022   ALKPHOS 67 11/20/2022   BILITOT 0.6 11/20/2022      Microbiology: Recent  Results (from the past 240 hour(s))  Resp panel by RT-PCR (RSV, Flu A&B, Covid) Anterior Nasal Swab     Status: None   Collection Time: 11/20/22  4:14 PM   Specimen: Anterior Nasal Swab  Result Value Ref Range Status   SARS Coronavirus 2 by RT PCR NEGATIVE NEGATIVE Final   Influenza A by PCR NEGATIVE NEGATIVE Final   Influenza B by PCR NEGATIVE NEGATIVE Final    Comment: (NOTE) The Xpert Xpress SARS-CoV-2/FLU/RSV plus assay is intended as an aid in the diagnosis of influenza from Nasopharyngeal swab specimens and should not be used as a sole basis for treatment. Nasal washings and aspirates are unacceptable for Xpert Xpress SARS-CoV-2/FLU/RSV testing.  Fact Sheet for Patients: BloggerCourse.com  Fact Sheet for Healthcare Providers: SeriousBroker.it  This test is not yet approved or cleared by the [place] FDA and has been authorized for detection and/or diagnosis of SARS-CoV-2 by FDA under an Emergency Use Authorization (EUA). This EUA will remain in effect (  meaning this test can be used) for the duration of the COVID-19 declaration under Section 564(b)(1) of the Act, 21 U.S.C. section 360bbb-3(b)(1), unless the authorization is terminated or revoked.     Resp Syncytial Virus by PCR NEGATIVE NEGATIVE Final    Comment: (NOTE) Fact Sheet for Patients: BloggerCourse.com  Fact Sheet for Healthcare Providers: SeriousBroker.it  This test is not yet approved or cleared by the [place] FDA and has been authorized for detection and/or diagnosis of SARS-CoV-2 by FDA under an Emergency Use Authorization (EUA). This EUA will remain in effect (meaning this test can be used) for the duration of the COVID-19 declaration under Section 564(b)(1) of the Act, 21 U.S.C. section 360bbb-3(b)(1), unless the authorization is terminated or revoked.  Performed at [facility],  [address] [place], [place] [address]   Blood Culture (routine x 2)     Status: None (Preliminary result)   Collection Time: 11/20/22  4:14 PM   Specimen: BLOOD LEFT ARM  Result Value Ref Range Status   Specimen Description BLOOD LEFT ARM  Final   Special Requests   Final    BOTTLES DRAWN AEROBIC AND ANAEROBIC Blood Culture results may not be optimal due to an excessive volume of blood received in culture bottles   Culture   Final    NO GROWTH 3 DAYS Performed at [facility], [address], [place], [place] [address]    Report Status PENDING  Incomplete  Blood Culture (routine x 2)     Status: None (Preliminary result)   Collection Time: 11/20/22  8:19 PM   Specimen: BLOOD RIGHT ARM  Result Value Ref Range Status   Specimen Description BLOOD RIGHT ARM  Final   Special Requests   Final    BOTTLES DRAWN AEROBIC AND ANAEROBIC Blood Culture adequate volume   Culture   Final    NO GROWTH 3 DAYS Performed at [facility], [address], [place], [place] [address]    Report Status PENDING  Incomplete     Serology:   Imaging: If present, new imagings (plain films, ct scans, and mri) have been personally visualized and interpreted; radiology reports have been reviewed. Decision making incorporated into the Impression / Recommendations.   11/20/22 cxr FINDINGS: Stable cardiomediastinal contours. Low lung volumes. No signs of pleural effusion or edema. No airspace opacities identified. Spinal fixation hardware noted within the mid and lower thoracic spine. No acute osseous findings.   IMPRESSION: Low lung volumes. No acute findings.   11/21/22 mri c, t, and lumbar spine IMPRESSION: CERVICAL SPINE:   1. Fluid and enhancement within the C3-C4 disc space with normal adjacent endplates and no paravertebral inflammatory change, favored to be degenerative in etiology. 2. 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 3.  Multilevel cervical spondylosis as described above. Moderate or severe bilateral neuroforaminal stenosis from C2-C3 through C6-C7. Moderate spinal canal stenosis at C4-C5.   THORACIC SPINE:   1. Recurrent T10-T11 osteomyelitis discitis with paravertebral inflammatory change and bilateral paravertebral abscesses measuring 2.7 x 2.1 cm on the left and 2.7 x 1.2 cm on the right. Evaluation for epidural involvement is limited due to hardware artifact obscuring the lower thoracic spinal canal. 2. Similar chronic sequela of osteomyelitis-discitis at T9-T10 without recurrence. 3. New adjacent segment disease at T5-T6 with large left subarticular disc extrusion, worsening left-sided facet arthropathy, and new moderate left neuroforaminal stenosis. 4. Recurrent small bilateral pleural effusions.  LUMBAR SPINE:   1. No evidence of osteomyelitis-discitis. 2. Postsurgical changes from prior thoracolumbosacral fusion.      03/2022 thoracic spine mri 1. Interval superior extension of the thoracolumbar fusion to include T6 through the sacrum. 2. The previously demonstrated changes of diskitis and osteomyelitis at T9-10 and T10-11 have substantially improved, and there is no evidence of residual or recurrent epidural or paraspinal fluid collection. There are some residual marrow changes within the T10 vertebral body, but no progressive bone destruction. 3. No new osseous abnormalities. 4. Possible subpleural right lower lobe pulmonary nodule. Further evaluation with chest CT recommended.     Raymondo Band, MD [facility] [phone] pager    11/23/2022, 8:42 AM

## 2022-11-23 NOTE — Progress Notes (Signed)
Subjective: The patient is alert and pleasant.  He is in no apparent distress.  Objective: Vital signs in last 24 hours: Temp:  [98 F (36.7 C)-99 F (37.2 C)] 98.7 F (37.1 C) (05/28 0733) Pulse Rate:  [77-87] 77 (05/28 0733) Resp:  [18-20] 18 (05/28 0402) BP: (109-139)/(67-79) 109/78 (05/28 0733) SpO2:  [91 %-94 %] 91 % (05/28 0733) Estimated body mass index is 44.09 kg/m as calculated from the following:   Height as of this encounter: 5\' 8"  (1.727 m).   Weight as of this encounter: 131.5 kg.   Intake/Output from previous day: 05/27 0701 - 05/28 0700 In: 200 [IV Piggyback:200] Out: 1050 [Urine:1050] Intake/Output this shift: No intake/output data recorded.  Physical exam the patient is alert and pleasant.  His lower extremity strength is grossly normal.  Lab Results: Recent Labs    11/22/22 0441 11/23/22 0321  WBC 13.0* 16.5*  HGB 12.6* 12.8*  HCT 37.6* 37.5*  PLT 229 230   BMET Recent Labs    11/22/22 0441 11/23/22 0321  NA 132* 131*  K 3.9 3.6  CL 96* 95*  CO2 25 21*  GLUCOSE 177* 172*  BUN 14 14  CREATININE 0.89 0.83  CALCIUM 7.9* 8.0*    Studies/Results: MR Cervical Spine W and Wo Contrast  Result Date: 11/21/2022 CLINICAL DATA:  Left-sided back pain at the thoracolumbar junction for the past week. 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 Recently completed antibiotics two weeks ago. Gram-negative rod bacteremia. EXAM: MRI CERVICAL, THORACIC AND LUMBAR SPINE WITHOUT AND WITH CONTRAST TECHNIQUE: Multiplanar and multiecho pulse sequences of the cervical spine, to include the craniocervical junction and cervicothoracic junction, and thoracic and lumbar spine, were obtained without and with intravenous contrast. CONTRAST:  10mL GADAVIST GADOBUTROL 1 MMOL/ML IV SOLN COMPARISON:  CT chest dated May 03, 2022. MRI thoracic spine dated April 16, 2022. MRI lumbar spine dated March 21, 2021. FINDINGS: MRI  CERVICAL SPINE FINDINGS Alignment: Trace retrolisthesis at C4-C5. Trace anterolisthesis at C6-C7. Vertebrae: Fluid and enhancement within the C3-C4 disc space with normal adjacent endplates and no paravertebral inflammatory change. No fracture or focal bone lesion. 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 Remaining cervical spinal cord is unremarkable. No abnormal intrathecal enhancement. Posterior Fossa, vertebral arteries, paraspinal tissues: Negative. Disc levels: C2-C3: Negative disc. Mild bilateral uncovertebral hypertrophy. Severe left and moderate right facet arthropathy. Moderate bilateral neuroforaminal stenosis. No spinal canal stenosis. C3-C4: Mild disc bulging with superimposed small central disc protrusion. Severe right and moderate left facet uncovertebral hypertrophy. Severe bilateral neuroforaminal stenosis. No spinal canal stenosis. C4-C5: Broad-based posterior disc osteophyte complex with superimposed central disc protrusion. Bilateral facet uncovertebral hypertrophy. Moderate spinal canal stenosis. Severe bilateral neuroforaminal stenosis. C5-C6: Mild disc bulging. Moderate bilateral facet uncovertebral hypertrophy. Mild spinal canal stenosis. Severe bilateral neuroforaminal stenosis. C6-C7: Mild disc bulging. Superimposed small right subarticular disc extrusion migrating superiorly and small left foraminal disc protrusion. Advanced bilateral facet uncovertebral hypertrophy. Moderate bilateral neuroforaminal stenosis. No spinal canal stenosis. C7-T1: Mild disc bulging eccentric to the left. Moderate left and mild right facet arthropathy. No stenosis. MRI THORACIC SPINE FINDINGS Alignment:  Physiologic. Vertebrae: Increased abnormal intradiscal fluid at T10-T11 with similar patchy marrow edema, low T1 signal, and heterogeneous enhancement of the T10 vertebral body. No definite progressive bony destruction. However, there is new paravertebral  inflammation with bilateral rim enhancing fluid collections measuring 2.7 x 2.1 cm on the left and 2.7 x 1.2 cm  on the right (series 29, image 18). Residual intradiscal fluid and enhancement at T9-T10 is unchanged. No fracture or suspicious bone lesion. Cord: The thoracic spinal cord is unremarkable to T8-T9. Below T9, the cord is not well evaluated due to artifact. No abnormal intrathecal enhancement. Paraspinal and other soft tissues: Small bilateral pleural effusions and bilateral lower lobe atelectasis. Disc levels: Posterior fusion from T6 to the sacrum. At T5-T6, there is a new large left subarticular disc extrusion migrating superiorly, with worsening left-sided facet arthropathy and new small joint effusion. Resultant new moderate left neuroforaminal stenosis. MRI LUMBAR SPINE FINDINGS Segmentation:  Standard. Alignment: Unchanged trace retrolisthesis at T12-L1. Unchanged trace anterolisthesis at L3-L4 and L4-L5. Vertebrae: Posterior fusion to the sacrum. Interbody fusion from L2-L3 through L5-S1. No fracture, evidence of discitis, or suspicious bone lesion. Conus medullaris and cauda equina: Not well evaluated due to hardware artifact. Paraspinal and other soft tissues: No paravertebral inflammatory change. Disc levels: The upper lumbar spinal canal is not well evaluated due to susceptibility artifact. The spinal canal below L1-L2 is widely patent. IMPRESSION: CERVICAL SPINE: 1. Fluid and enhancement within the C3-C4 disc space with normal adjacent endplates and no paravertebral inflammatory change, favored to be degenerative in etiology. 2. 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 3. Multilevel cervical spondylosis as described above. Moderate or severe bilateral neuroforaminal stenosis from C2-C3 through C6-C7. Moderate spinal canal stenosis at C4-C5. THORACIC SPINE: 1. Recurrent T10-T11 osteomyelitis discitis with paravertebral inflammatory change and  bilateral paravertebral abscesses measuring 2.7 x 2.1 cm on the left and 2.7 x 1.2 cm on the right. Evaluation for epidural involvement is limited due to hardware artifact obscuring the lower thoracic spinal canal. 2. Similar chronic sequela of osteomyelitis-discitis at T9-T10 without recurrence. 3. New adjacent segment disease at T5-T6 with large left subarticular disc extrusion, worsening left-sided facet arthropathy, and new moderate left neuroforaminal stenosis. 4. Recurrent small bilateral pleural effusions. LUMBAR SPINE: 1. No evidence of osteomyelitis-discitis. 2. Postsurgical changes from prior thoracolumbosacral fusion. Electronically Signed   By: Obie Dredge M.D.   On: 11/21/2022 14:52   MR THORACIC SPINE W WO CONTRAST  Result Date: 11/21/2022 CLINICAL DATA:  Left-sided back pain at the thoracolumbar junction for the past week. 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 Recently completed antibiotics two weeks ago. Gram-negative rod bacteremia. EXAM: MRI CERVICAL, THORACIC AND LUMBAR SPINE WITHOUT AND WITH CONTRAST TECHNIQUE: Multiplanar and multiecho pulse sequences of the cervical spine, to include the craniocervical junction and cervicothoracic junction, and thoracic and lumbar spine, were obtained without and with intravenous contrast. CONTRAST:  10mL GADAVIST GADOBUTROL 1 MMOL/ML IV SOLN COMPARISON:  CT chest dated May 03, 2022. MRI thoracic spine dated April 16, 2022. MRI lumbar spine dated March 21, 2021. FINDINGS: MRI CERVICAL SPINE FINDINGS Alignment: Trace retrolisthesis at C4-C5. Trace anterolisthesis at C6-C7. Vertebrae: Fluid and enhancement within the C3-C4 disc space with normal adjacent endplates and no paravertebral inflammatory change. No fracture or focal bone lesion. 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 Remaining cervical spinal cord is unremarkable. No abnormal  intrathecal enhancement. Posterior Fossa, vertebral arteries, paraspinal tissues: Negative. Disc levels: C2-C3: Negative disc. Mild bilateral uncovertebral hypertrophy. Severe left and moderate right facet arthropathy. Moderate bilateral neuroforaminal stenosis. No spinal canal stenosis. C3-C4: Mild disc bulging with superimposed small central disc protrusion. Severe right and moderate left facet uncovertebral hypertrophy. Severe bilateral neuroforaminal stenosis. No spinal canal  stenosis. C4-C5: Broad-based posterior disc osteophyte complex with superimposed central disc protrusion. Bilateral facet uncovertebral hypertrophy. Moderate spinal canal stenosis. Severe bilateral neuroforaminal stenosis. C5-C6: Mild disc bulging. Moderate bilateral facet uncovertebral hypertrophy. Mild spinal canal stenosis. Severe bilateral neuroforaminal stenosis. C6-C7: Mild disc bulging. Superimposed small right subarticular disc extrusion migrating superiorly and small left foraminal disc protrusion. Advanced bilateral facet uncovertebral hypertrophy. Moderate bilateral neuroforaminal stenosis. No spinal canal stenosis. C7-T1: Mild disc bulging eccentric to the left. Moderate left and mild right facet arthropathy. No stenosis. MRI THORACIC SPINE FINDINGS Alignment:  Physiologic. Vertebrae: Increased abnormal intradiscal fluid at T10-T11 with similar patchy marrow edema, low T1 signal, and heterogeneous enhancement of the T10 vertebral body. No definite progressive bony destruction. However, there is new paravertebral inflammation with bilateral rim enhancing fluid collections measuring 2.7 x 2.1 cm on the left and 2.7 x 1.2 cm on the right (series 29, image 18). Residual intradiscal fluid and enhancement at T9-T10 is unchanged. No fracture or suspicious bone lesion. Cord: The thoracic spinal cord is unremarkable to T8-T9. Below T9, the cord is not well evaluated due to artifact. No abnormal intrathecal enhancement. Paraspinal and  other soft tissues: Small bilateral pleural effusions and bilateral lower lobe atelectasis. Disc levels: Posterior fusion from T6 to the sacrum. At T5-T6, there is a new large left subarticular disc extrusion migrating superiorly, with worsening left-sided facet arthropathy and new small joint effusion. Resultant new moderate left neuroforaminal stenosis. MRI LUMBAR SPINE FINDINGS Segmentation:  Standard. Alignment: Unchanged trace retrolisthesis at T12-L1. Unchanged trace anterolisthesis at L3-L4 and L4-L5. Vertebrae: Posterior fusion to the sacrum. Interbody fusion from L2-L3 through L5-S1. No fracture, evidence of discitis, or suspicious bone lesion. Conus medullaris and cauda equina: Not well evaluated due to hardware artifact. Paraspinal and other soft tissues: No paravertebral inflammatory change. Disc levels: The upper lumbar spinal canal is not well evaluated due to susceptibility artifact. The spinal canal below L1-L2 is widely patent. IMPRESSION: CERVICAL SPINE: 1. Fluid and enhancement within the C3-C4 disc space with normal adjacent endplates and no paravertebral inflammatory change, favored to be degenerative in etiology. 2. 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 3. Multilevel cervical spondylosis as described above. Moderate or severe bilateral neuroforaminal stenosis from C2-C3 through C6-C7. Moderate spinal canal stenosis at C4-C5. THORACIC SPINE: 1. Recurrent T10-T11 osteomyelitis discitis with paravertebral inflammatory change and bilateral paravertebral abscesses measuring 2.7 x 2.1 cm on the left and 2.7 x 1.2 cm on the right. Evaluation for epidural involvement is limited due to hardware artifact obscuring the lower thoracic spinal canal. 2. Similar chronic sequela of osteomyelitis-discitis at T9-T10 without recurrence. 3. New adjacent segment disease at T5-T6 with large left subarticular disc extrusion, worsening left-sided facet arthropathy,  and new moderate left neuroforaminal stenosis. 4. Recurrent small bilateral pleural effusions. LUMBAR SPINE: 1. No evidence of osteomyelitis-discitis. 2. Postsurgical changes from prior thoracolumbosacral fusion. Electronically Signed   By: Obie Dredge M.D.   On: 11/21/2022 14:52   MR Lumbar Spine W Wo Contrast  Result Date: 11/21/2022 CLINICAL DATA:  Left-sided back pain at the thoracolumbar junction for the past week. 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 Recently completed antibiotics two weeks ago. Gram-negative rod bacteremia. EXAM: MRI CERVICAL, THORACIC AND LUMBAR SPINE WITHOUT AND WITH CONTRAST TECHNIQUE: Multiplanar and multiecho pulse sequences of the cervical spine, to include the craniocervical junction and cervicothoracic junction, and thoracic and lumbar spine, were obtained without and with intravenous contrast. CONTRAST:  10mL GADAVIST GADOBUTROL 1 MMOL/ML IV SOLN COMPARISON:  CT chest dated May 03, 2022. MRI thoracic spine dated April 16, 2022. MRI lumbar spine dated March 21, 2021. FINDINGS: MRI CERVICAL SPINE FINDINGS Alignment: Trace retrolisthesis at C4-C5. Trace anterolisthesis at C6-C7. Vertebrae: Fluid and enhancement within the C3-C4 disc space with normal adjacent endplates and no paravertebral inflammatory change. No fracture or focal bone lesion. 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 Remaining cervical spinal cord is unremarkable. No abnormal intrathecal enhancement. Posterior Fossa, vertebral arteries, paraspinal tissues: Negative. Disc levels: C2-C3: Negative disc. Mild bilateral uncovertebral hypertrophy. Severe left and moderate right facet arthropathy. Moderate bilateral neuroforaminal stenosis. No spinal canal stenosis. C3-C4: Mild disc bulging with superimposed small central disc protrusion. Severe right and moderate left facet uncovertebral hypertrophy. Severe  bilateral neuroforaminal stenosis. No spinal canal stenosis. C4-C5: Broad-based posterior disc osteophyte complex with superimposed central disc protrusion. Bilateral facet uncovertebral hypertrophy. Moderate spinal canal stenosis. Severe bilateral neuroforaminal stenosis. C5-C6: Mild disc bulging. Moderate bilateral facet uncovertebral hypertrophy. Mild spinal canal stenosis. Severe bilateral neuroforaminal stenosis. C6-C7: Mild disc bulging. Superimposed small right subarticular disc extrusion migrating superiorly and small left foraminal disc protrusion. Advanced bilateral facet uncovertebral hypertrophy. Moderate bilateral neuroforaminal stenosis. No spinal canal stenosis. C7-T1: Mild disc bulging eccentric to the left. Moderate left and mild right facet arthropathy. No stenosis. MRI THORACIC SPINE FINDINGS Alignment:  Physiologic. Vertebrae: Increased abnormal intradiscal fluid at T10-T11 with similar patchy marrow edema, low T1 signal, and heterogeneous enhancement of the T10 vertebral body. No definite progressive bony destruction. However, there is new paravertebral inflammation with bilateral rim enhancing fluid collections measuring 2.7 x 2.1 cm on the left and 2.7 x 1.2 cm on the right (series 29, image 18). Residual intradiscal fluid and enhancement at T9-T10 is unchanged. No fracture or suspicious bone lesion. Cord: The thoracic spinal cord is unremarkable to T8-T9. Below T9, the cord is not well evaluated due to artifact. No abnormal intrathecal enhancement. Paraspinal and other soft tissues: Small bilateral pleural effusions and bilateral lower lobe atelectasis. Disc levels: Posterior fusion from T6 to the sacrum. At T5-T6, there is a new large left subarticular disc extrusion migrating superiorly, with worsening left-sided facet arthropathy and new small joint effusion. Resultant new moderate left neuroforaminal stenosis. MRI LUMBAR SPINE FINDINGS Segmentation:  Standard. Alignment: Unchanged trace  retrolisthesis at T12-L1. Unchanged trace anterolisthesis at L3-L4 and L4-L5. Vertebrae: Posterior fusion to the sacrum. Interbody fusion from L2-L3 through L5-S1. No fracture, evidence of discitis, or suspicious bone lesion. Conus medullaris and cauda equina: Not well evaluated due to hardware artifact. Paraspinal and other soft tissues: No paravertebral inflammatory change. Disc levels: The upper lumbar spinal canal is not well evaluated due to susceptibility artifact. The spinal canal below L1-L2 is widely patent. IMPRESSION: CERVICAL SPINE: 1. Fluid and enhancement within the C3-C4 disc space with normal adjacent endplates and no paravertebral inflammatory change, favored to be degenerative in etiology. 2. 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 3. Multilevel cervical spondylosis as described above. Moderate or severe bilateral neuroforaminal stenosis from C2-C3 through C6-C7. Moderate spinal canal stenosis at C4-C5. THORACIC SPINE: 1. Recurrent T10-T11 osteomyelitis discitis with paravertebral inflammatory change and bilateral paravertebral abscesses measuring 2.7 x 2.1 cm on the left and 2.7 x 1.2 cm on the right. Evaluation for epidural involvement is limited due to hardware artifact obscuring the lower thoracic spinal canal. 2. Similar chronic sequela of osteomyelitis-discitis  at T9-T10 without recurrence. 3. New adjacent segment disease at T5-T6 with large left subarticular disc extrusion, worsening left-sided facet arthropathy, and new moderate left neuroforaminal stenosis. 4. Recurrent small bilateral pleural effusions. LUMBAR SPINE: 1. No evidence of osteomyelitis-discitis. 2. Postsurgical changes from prior thoracolumbosacral fusion. Electronically Signed   By: Obie Dredge M.D.   On: 11/21/2022 14:52    Assessment/Plan: Recurrent thoracic osteomyelitis discitis: His MRI scans do not demonstrate any operable lesions.  He should continue with  antibiotics per ID.  Please have him follow-up with Dr. Jake Samples in the office in a few weeks.  I will sign off.  Please call if I can be of further assistance.  LOS: 2 days     Cristi Loron 11/23/2022, 7:51 AM     Patient ID: Unk Lightning, male   DOB: Apr 05, 1951, 72 y.o.   MRN: [id number]

## 2022-11-23 NOTE — Discharge Instructions (Signed)
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  Also along with your infectious disease team and neurosurgery think that continuing IV antibiotic therapy is the best plan.  You will continue to follow-up with your primary care provider and infectious disease doctors as your treatment continues.  Continue taking your home medications with the following changes  Start taking IV Cefazolin 2 g every 8 hours until 01/01/2023 Oxycodone 5 mg every 6 hours as needed for severe pain.  If you are taking this more than once a day please call your infectious disease doctor or primary care provider as you may need to be reevaluated. Dulcolax 5 mg every day as needed for constipation.  This is a short-term medication and if you are using it for more than 3 days in a row please increase the amount of MiraLAX you are taking.  You should not exceed 7 days in a row with this medication. MiraLAX daily as needed for constipation, you may increase to twice daily if needed Stop taking Cefadroxil  Continue taking your other medications as prescribed  You should seek further medical care if you have any increased pain in your back, fevers, lower extremity numbness or weakness, or any other worrisome symptoms.  We recommend that you see your primary care doctor in about a week to make sure that you continue to improve. We are so glad that you are feeling better.  Sincerely, Rocky Morel, DO

## 2022-11-23 NOTE — TOC Initial Note (Signed)
Transition of Care [facility]) - Initial/Assessment Note    Patient Details  Name: Marco Kennedy MRN: [id number] Date of Birth: 08/02/1950  Transition of Care [facility]) CM/SW Contact:    Janae Bridgeman, RN Phone Number: 11/23/2022, 3:51 PM  Clinical Narrative:                 CM met with the patient at the bedside to discuss TOC needs to return home with home health services and IV antibiotics.  The patient lives with his wife in [place], [place].  The patient had Right arm PICC line placed today.  I called Jeri Modena, RNCM with [employer] and discuss patient's needs for IV antibiotics for home.  OPAT orders were co-signed by the attending physician so that she could coordinate delivery of the medications for home which will not be delivered until tomorrow, 11/24/22.  The patient, wife and MD are aware.  I discussed with the patient choice regarding home health services and the patent had [facility] in the past.  I called Bjorn Loser, RNCM with [facility] and she accepted for home health RN, PT.  Home health orders placed to be co-signed by the MD.  The patient's wife will receive PICC line teaching tomorrow and IV antibiotics will be coordinated to delivery to the [place] home tomorrow.  I will speak with Jeri Modena, RNCM with [employer] tomorrow to find out when teaching has been completed with the wife to determine discharge time to home - tomorrow.  The patient's wife will provide transportation to home tomorrow - once home health is coordinated and teaching is done.  Patient will likely discharge tomorrow evening since the wife is unable to take him home until after work tomorrow.  Expected Discharge Plan: Home w Home Health Services Barriers to Discharge: Continued Medical Work up   Patient Goals and CMS Choice Patient states their goals for this hospitalization and ongoing recovery are:: To return home CMS Medicare.gov Compare Post Acute Care list provided to:: Patient Choice offered  to / list presented to : Patient [place] ownership interest in [facility]provided to:: Patient    Expected Discharge Plan and Services   Discharge Planning Services: CM Consult Post Acute Care Choice: Home Health Living arrangements for the past 2 months: Single Family Home                 DME Arranged:  (IV antibiotics through [employer] DME company) DME Agency: NA Date DME Agency Contacted: 11/23/22 Time DME Agency Contacted: [phone] Representative spoke with at DME Agency: Jeri Modena, RNCM with [employer] to coordinate IV anitbiotics for home. HH Arranged: Charity fundraiser, PT HH Agency: [facility] Date [facility] Agency Contacted: 11/23/22 Time HH Agency Contacted: [phone] Representative spoke with at [facility] Agency: Bjorn Loser, RNCM with [facility] health  Prior Living Arrangements/Services Living arrangements for the past 2 months: Single Family Home Lives with:: Spouse Patient language and need for interpreter reviewed:: Yes Do you feel safe going back to the place where you live?: Yes      Need for Family Participation in Patient Care: Yes (Comment) Care giver support system in place?: Yes (comment) Current home services: DME, Home PT, Home OT (RW, Rollator, Steady, WC, shower bench, tub rails) Criminal Activity/Legal Involvement Pertinent to Current Situation/Hospitalization: No - Comment as needed  Activities of Daily Living Home Assistive Devices/Equipment: None ADL Screening (condition at time of admission) Patient's cognitive ability adequate to safely complete daily activities?: Yes Is the patient deaf or  have difficulty hearing?: No Does the patient have difficulty seeing, even when wearing glasses/contacts?: No Does the patient have difficulty concentrating, remembering, or making decisions?: No Patient able to express need for assistance with ADLs?: Yes Does the patient have difficulty dressing or bathing?: Yes Independently performs ADLs?: Yes  (appropriate for developmental age) Does the patient have difficulty walking or climbing stairs?: No Weakness of Legs: None Weakness of Arms/Hands: None  Permission Sought/Granted Permission sought to share information with : Case Manager, Family Supports, Oceanographer granted to share information with : Yes, Verbal Permission Granted     Permission granted to share info w AGENCY: [employer] DME company accepted for IV antibiotics coordination,  [facility] accepted for home health RN, PT  Permission granted to share info w Relationship: wife     Emotional Assessment Appearance:: Appears stated age Attitude/Demeanor/Rapport: Gracious Affect (typically observed): Accepting Orientation: : Oriented to Self, Oriented to Place, Oriented to  Time, Oriented to Situation Alcohol / Substance Use: Not Applicable Psych Involvement: No (comment)  Admission diagnosis:  Bacteremia [R78.81] Patient Active Problem List   Diagnosis Date Noted   Bacteremia 11/20/2022   Neurogenic bladder 07/05/2022   Abnormality of gait 07/05/2022   Bilateral foot-drop 12/04/2021   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   Incomplete paraplegia (HCC) 05/20/2021   Bilateral leg edema 05/20/2021   Drug induced constipation    Pressure injury of skin 04/14/2021   Hypoalbuminemia due to protein-calorie malnutrition (HCC)    Essential hypertension    Acute blood loss anemia    Labile blood pressure    Postoperative pain    Discitis of thoracic region    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 coli) (HCC) 02/01/2021   Empyema (HCC)    Anasarca 01/20/2021   OSA (obstructive sleep apnea) 01/20/2021   Obesity, Class III, BMI 40-49.9  (morbid obesity) (HCC) 01/20/2021   Pleural effusion    Vertebral osteomyelitis, acute recurrent (HCC)    Hardware complicating wound infection (HCC)    Abscess of back 01/11/2021   Lumbar scoliosis 04/18/2020   Back pain 12/25/2019   S/P left THA, AA 06/15/2016   PCP:  Maximiano Coss, MD Pharmacy:   [facility] [phone] - [place], [place] - [id number] [place] [place] [address] Phone: [phone] Fax: [phone]  [facility] - [place], [place] - [address] [place] [address] Phone: [phone] Fax: [phone]     Social Determinants of Health (SDOH) Social History: SDOH Screenings   Food Insecurity: No Food Insecurity (11/20/2022)  Housing: Low Risk  (11/20/2022)  Transportation Needs: No Transportation Needs (11/20/2022)  Utilities: Not At Risk (11/20/2022)  Depression (PHQ2-9): Low Risk  (11/05/2022)  Tobacco Use: Low Risk  (11/20/2022)   SDOH Interventions:     Readmission Risk Interventions    11/23/2022    3:51 PM  Readmission Risk Prevention Plan  Post Dischage Appt Complete  Medication Screening Complete  Transportation Screening Complete

## 2022-11-23 NOTE — Progress Notes (Signed)
Peripherally Inserted Central Catheter Placement  The IV Nurse has discussed with the patient and/or persons authorized to consent for the patient, the purpose of this procedure and the potential benefits and risks involved with this procedure.  The benefits include less needle sticks, lab draws from the catheter, and the patient may be discharged home with the catheter. Risks include, but not limited to, infection, bleeding, blood clot (thrombus formation), and puncture of an artery; nerve damage and irregular heartbeat and possibility to perform a PICC exchange if needed/ordered by physician.  Alternatives to this procedure were also discussed.  Bard Power PICC patient education guide, fact sheet on infection prevention and patient information card has been provided to patient /or left at bedside.    PICC Placement Documentation  PICC Single Lumen 11/23/22 Right Brachial 41 cm 1 cm (Active)  Indication for Insertion or Continuance of Line Home intravenous therapies (PICC only) 11/23/22 1348  Exposed Catheter (cm) 1 cm 11/23/22 1348  Site Assessment Clean, Dry, Intact 11/23/22 1348  Line Status Flushed;Saline locked;Blood return noted 11/23/22 1348  Dressing Type Transparent;Securing device 11/23/22 1348  Dressing Status Antimicrobial disc in place;Clean, Dry, Intact 11/23/22 1348  Safety Lock Intact 11/23/22 1348  Line Adjustment (NICU/IV Team Only) No 11/23/22 1348  Dressing Intervention New dressing;Other (Comment) 11/23/22 1348  Dressing Change Due 11/30/22 11/23/22 1348       Maximino Greenland 11/23/2022, 1:51 PM

## 2022-11-23 NOTE — Progress Notes (Addendum)
HD#2 Subjective:   Summary: Marco Kennedy is a 72 y.o. 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   Doing overall well this morning with slightly improved abdominal pain.  But he has not had a significant bowel movement in about a week.  He did have a very small bowel movement yesterday.  No increased abdominal pain and no significant nausea.  Objective:  Vital signs in last 24 hours: Vitals:   11/22/22 1955 11/23/22 0402 11/23/22 0733 11/23/22 1015  BP: 139/72 134/67 109/78 (!) 111/53  Pulse: 87 83 77 78  Resp: 18 18  17   Temp: 99 F (37.2 C) 98.2 F (36.8 C) 98.7 F (37.1 C)   TempSrc:      SpO2: 93% 94% 91% 90%  Weight:      Height:       Supplemental O2: Room Air SpO2: 90 %   Physical Exam:  Constitutional: Well-appearing elderly male. In no acute distress. Cardio:Regular rate and rhythm. . 2+ bilateral radial and dorsalis pedis  pulses. Pulm: Normal work of breathing on room air. Abdomen: Soft, non-tender, mildly distended, positive bowel sounds. MSK: Trace lower extremity edema. Skin:Warm and dry. Neuro:Alert and oriented x3.  No focal deficits.    Filed Weights   11/20/22 1604  Weight: 131.5 kg      Intake/Output Summary (Last 24 hours) at 11/23/2022 1136 Last data filed at 11/23/2022 0800 Gross per 24 hour  Intake 200 ml  Output 1050 ml  Net -850 ml   Net IO Since Admission: 25.42 mL [11/23/22 1136]  Pertinent Labs:    Latest Ref Rng & Units 11/23/2022    3:21 AM 11/22/2022    4:41 AM 11/21/2022    5:41 AM  CBC  WBC 4.0 - 10.5 K/uL 16.5  13.0  11.3   Hemoglobin 13.0 - 17.0 g/dL 16.1  09.6  04.5   Hematocrit 39.0 - 52.0 % 37.5   37.6  39.9   Platelets 150 - 400 K/uL 230  229  237        Latest Ref Rng & Units 11/23/2022    3:21 AM 11/22/2022    4:41 AM 11/21/2022    5:41 AM  CMP  Glucose 70 - 99 mg/dL 409  811  914   BUN 8 - 23 mg/dL 14  14  17    Creatinine 0.61 - 1.24 mg/dL 7.82  9.56  2.13   Sodium 135 - 145 mmol/L 131  132  133   Potassium 3.5 - 5.1 mmol/L 3.6  3.9  3.5   Chloride 98 - 111 mmol/L 95  96  97   CO2 22 - 32 mmol/L 21  25  26    Calcium 8.9 - 10.3 mg/dL 8.0  7.9  8.1     Assessment/Plan:   Principal Problem:   Vertebral osteomyelitis, acute recurrent (HCC) Active Problems:   Obesity, Class III, BMI 40-49.9 (morbid obesity) (HCC)   Discitis of thoracic region   Hypoalbuminemia due to protein-calorie malnutrition (HCC)   Bacteremia   Patient Summary: Marco Kennedy is a 72 y.o. male with hx of T2DM, hypertension, and degenerative lumbar spinal stenosis s/p extensive thoracal lumbar surgery with hardware in place complicated by vertebral osteomyelitis and paravertebral abscess  in 2022 on chronic suppressive antibiotic therapy who presented with gram-negative rod bacteremia and is admitted for ID and neurosurgical evaluation, on hospital day 2.   # Gram-negative bacteremia # Recurrent vertebral osteomyelitis # Paravertebral abscesses Patient sent to [facility] for ID and neurosurgery eval with pansensitive E. coli bacteremia from outside hospital.  MRI of the spine shows recurrent T10-T11 osteomyelitis discitis as well as bilateral paravertebral abscesses. Remains hemodynamically stable and blood cultures here NGTD.  Narrowed from broad-spectrum antibiotics to cefazolin which he will continue for 6-8 weeks and then be on lifelong high-dose cefadroxil.  He has not used much of the opiates at all so we will decrease the doses.  He may not need any opiates on discharge. -Neurosurgery and ID following, appreciate recs -Continue cefazolin for 6-8 weeks, will need PICC -Oral oxycodone 5 mg every 6  hours prn -PT/OT  Constipation Roughly 5 days of constipation after starting opiates.  Had a bowel yesterday but was very small.  Will continue bowel regimen.  Can consider enema if no bowel movement later today. - MiraLAX, Senokot 2 tablets nightly, added Dulcolax p.o.  Hypoxemia O2 sats have been between 90 and 92 yesterday on room air without symptoms.  Improved today with O2 sats 92-94 after giving incentive spirometer yesterday.  Most likely atelectasis but will get a chest x-ray if there is any changes.   # HTN -Continue amlodipine 10 mg daily and carvedilol 3.125 mg twice daily   # T2DM (need confirmation; prediabetes listed in chart, A1c 5.9 in 2022) Repeat A1c still in process.  Blood sugars still elevated to the 170s today. -Continue SSI with meals -CBG monitoring   # Prerenal AKI, resolved 1.31 on admission and stable now at 0.8-0.9. -Trend renal function panel  Diet: Carb-Modified VTE: Enoxaparin Code: Full  Dispo: Anticipated discharge today or tomorrow pending PICC placement and bowel movement.  Rocky Morel, DO Internal Medicine Resident PGY-1 Pager: [phone]  Please contact the on call pager after 5 pm and on weekends at [phone].

## 2022-11-23 NOTE — Progress Notes (Signed)
PHARMACY CONSULT NOTE FOR:  OUTPATIENT  PARENTERAL ANTIBIOTIC THERAPY (OPAT)  Indication: E Coli epidural abscess  Regimen: Cefazolin 2 gm IV Q 8 hours  End date: 01/01/23  IV antibiotic discharge orders are pended. To discharging provider:  please sign these orders via discharge navigator,  Select New Orders & click on the button choice - Manage This Unsigned Work.     Thank you for allowing pharmacy to be a part of this patient's care.  Sharin Mons, PharmD, BCPS, BCIDP Infectious Diseases Clinical Pharmacist Phone: [phone] 11/23/2022, 11:47 AM

## 2022-11-24 DIAGNOSIS — G061 Intraspinal abscess and granuloma: Secondary | ICD-10-CM | POA: Diagnosis not present

## 2022-11-24 DIAGNOSIS — E1169 Type 2 diabetes mellitus with other specified complication: Secondary | ICD-10-CM | POA: Diagnosis not present

## 2022-11-24 DIAGNOSIS — M4620 Osteomyelitis of vertebra, site unspecified: Secondary | ICD-10-CM | POA: Diagnosis not present

## 2022-11-24 DIAGNOSIS — R7881 Bacteremia: Secondary | ICD-10-CM | POA: Diagnosis not present

## 2022-11-24 LAB — GLUCOSE, CAPILLARY
Glucose-Capillary: 151 mg/dL — ABNORMAL HIGH (ref 70–99)
Glucose-Capillary: 162 mg/dL — ABNORMAL HIGH (ref 70–99)
Glucose-Capillary: 177 mg/dL — ABNORMAL HIGH (ref 70–99)
Glucose-Capillary: 221 mg/dL — ABNORMAL HIGH (ref 70–99)

## 2022-11-24 LAB — CULTURE, BLOOD (ROUTINE X 2): Culture: NO GROWTH

## 2022-11-24 NOTE — Progress Notes (Signed)
HD#3 Subjective:   Summary: Marco Kennedy is a 72 y.o. 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   Doing well this morning with improved abdominal bloating after having a significant bowel movement yesterday following tapwater enema.  He also thinks that his back pain is improving as well.  He is ready to go home and resume physical therapy.  Objective:  Vital signs in last 24 hours: Vitals:   11/23/22 1654 11/23/22 2051 11/24/22 0008 11/24/22 0413  BP: (!) 147/82 (!) 141/69 130/67 (!) 144/97  Pulse: 79 90 76 90  Resp:  18 18 18   Temp: 98.1 F (36.7 C) 99 F (37.2 C) 98.6 F (37 C) 98.6 F (37 C)  TempSrc:      SpO2: 91% 94% 93% 90%  Weight:      Height:       Supplemental O2: Room Air SpO2: 90 %   Physical Exam:  Constitutional: Well-appearing elderly male. In no acute distress. Cardio:Regular rate and rhythm.  Pulm: Normal work of breathing on room air. Abdomen: Soft, non-tender, nondistended, positive bowel sounds. MSK: Trace lower extremity edema. Skin:Warm and dry. Neuro:Alert and oriented x3.  No focal deficits.    Filed Weights   11/20/22 1604  Weight: 131.5 kg      Intake/Output Summary (Last 24 hours) at 11/24/2022 1610 Last data filed at 11/24/2022 0502 Gross per 24 hour  Intake 210 ml  Output 676 ml  Net -466 ml   Net IO Since Admission: -115.58 mL [11/24/22 0642]  Pertinent Labs:    Latest Ref Rng & Units 11/23/2022    3:21 AM 11/22/2022    4:41 AM 11/21/2022    5:41 AM  CBC  WBC 4.0 - 10.5 K/uL 16.5  13.0  11.3   Hemoglobin 13.0 - 17.0 g/dL 96.0  45.4  09.8   Hematocrit 39.0 - 52.0 % 37.5  37.6  39.9   Platelets 150 - 400  K/uL 230  229  237        Latest Ref Rng & Units 11/23/2022    3:21 AM 11/22/2022    4:41 AM 11/21/2022    5:41 AM  CMP  Glucose 70 - 99 mg/dL 119  147  829   BUN 8 - 23 mg/dL 14  14  17    Creatinine 0.61 - 1.24 mg/dL 5.62  1.30  8.65   Sodium 135 - 145 mmol/L 131  132  133   Potassium 3.5 - 5.1 mmol/L 3.6  3.9  3.5   Chloride 98 - 111 mmol/L 95  96  97   CO2 22 - 32 mmol/L 21  25  26    Calcium 8.9 - 10.3 mg/dL 8.0  7.9  8.1     Assessment/Plan:   Principal Problem:   Vertebral osteomyelitis, acute recurrent (HCC) Active Problems:   Obesity, Class III, BMI 40-49.9 (morbid obesity) (HCC)   Discitis of thoracic region   Hypoalbuminemia due to protein-calorie malnutrition (HCC)   Bacteremia   Patient Summary: Marco Kennedy is a 72 y.o. male with hx of T2DM, hypertension, and degenerative lumbar spinal stenosis s/p extensive thoracal lumbar surgery with hardware in place complicated by vertebral osteomyelitis and paravertebral abscess in 2022 on chronic suppressive antibiotic  therapy who presented with gram-negative rod bacteremia and is admitted for ID and neurosurgical evaluation, on hospital day 3.   # Gram-negative bacteremia # Recurrent vertebral osteomyelitis # Paravertebral abscesses Patient sent to [facility] for ID and neurosurgery eval with pansensitive E. coli bacteremia from outside hospital.  MRI of the spine shows recurrent T10-T11 osteomyelitis discitis as well as bilateral paravertebral abscesses. Remains hemodynamically stable and blood cultures here NGTD.  Narrowed from broad-spectrum antibiotics to cefazolin which he will continue for 6-8 weeks and then be on lifelong high-dose cefadroxil.  Will be discharged with short course of as needed oxycodone 5 mg.  Right now awaiting delivery of outpatient antibiotics to then discharge the patient. -Neurosurgery and ID following, appreciate recs -Continue cefazolin for 6-8 weeks, PICC line placed.  Last day of IV antibiotics  01/01/2023 -Oral oxycodone 5 mg every 6 hours prn -PT/OT  Constipation Roughly 5 days of constipation after starting opiates.  Had a bowel yesterday after tapwater enema and is feeling less bloated. - MiraLAX, Senokot 2 tablets nightly - Will discharge with daily or twice daily MiraLAX and as needed Dulcolax  Hypoxemia O2 sats have been between 90 and 94 for the past couple days on room air without symptoms.  Added incentive spirometer, still asymptomatic.  Most likely atelectasis but will get a chest x-ray if there is any changes.   # HTN -Continue amlodipine 10 mg daily and carvedilol 3.125 mg twice daily   # T2DM (need confirmation; prediabetes listed in chart, A1c 5.9 in 2022) Repeat A1c still in process.  Blood sugars still elevated to the 170s today. -Continue SSI with meals -CBG monitoring   # Prerenal AKI, resolved 1.31 on admission and stable now at 0.8-0.9. -Trend renal function panel  Diet: Carb-Modified VTE: Enoxaparin Code: Full  Dispo: Anticipated discharge today or tomorrow pending delivery of outpatient IV antibiotics.  Rocky Morel, DO Internal Medicine Resident PGY-1 Pager: [phone]  Please contact the on call pager after 5 pm and on weekends at [phone].

## 2022-11-24 NOTE — TOC Transition Note (Signed)
Transition of Care [facility]) - CM/SW Discharge Note   Patient Details  Name: Marco Kennedy MRN: [id number] Date of Birth: 05-06-1951  Transition of Care [facility]) CM/SW Contact:  Janae Bridgeman, RN Phone Number: 11/24/2022, 1:00 PM   Clinical Narrative:    CM spoke with Jeri Modena, RNCM with [employer] and she plans to meet with the patient's wife at the hospital for medication infusion teaching.  The patient's antibiotics will arrive at the home today so once the infusion teaching is completed at the bedside - patient can be discharged home with the wife.    Patient's wife plans to meet for teaching at the hospital around 3 pm today.  Attending MD and bedside nursing are aware of plans to discharge home later today.  Discharge orders and summary will be completed by MD.   Final next level of care: Home w Home Health Services Barriers to Discharge: Continued Medical Work up   Patient Goals and CMS Choice CMS Medicare.gov Compare Post Acute Care list provided to:: Patient Choice offered to / list presented to : Patient  Discharge Placement                         Discharge Plan and Services Additional resources added to the After Visit Summary for     Discharge Planning Services: CM Consult Post Acute Care Choice: Home Health          DME Arranged:  (IV antibiotics through [employer] DME company) DME Agency: NA Date DME Agency Contacted: 11/23/22 Time DME Agency Contacted: [phone] Representative spoke with at DME Agency: Jeri Modena, RNCM with [employer] to coordinate IV anitbiotics for home. HH Arranged: RN, PT HH Agency: [facility] Date [facility] Agency Contacted: 11/23/22 Time HH Agency Contacted: 1550 Representative spoke with at [facility] Agency: Bjorn Loser, RNCM with [facility] health  Social Determinants of Health (SDOH) Interventions SDOH Screenings   Food Insecurity: No Food Insecurity (11/20/2022)  Housing: Low Risk  (11/20/2022)   Transportation Needs: No Transportation Needs (11/20/2022)  Utilities: Not At Risk (11/20/2022)  Depression (PHQ2-9): Low Risk  (11/05/2022)  Tobacco Use: Low Risk  (11/20/2022)     Readmission Risk Interventions    11/23/2022    3:51 PM  Readmission Risk Prevention Plan  Post Dischage Appt Complete  Medication Screening Complete  Transportation Screening Complete

## 2022-11-24 NOTE — Progress Notes (Signed)
Physical Therapy Treatment Patient Details Name: Marco Kennedy MRN: [id number] DOB: 1951/05/18 Today's Date: 11/24/2022   History of Present Illness Pt is a 72 y.o. male presenting 5/25 with LL back pain; was here last week and now has been instructed to report back as blood cultures taken were positive. MRI throracic spine demonstrating recurrent osteomyelitis. 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 Has been on antibiotic therapy since and recently discontinued them. PMH significant for DMII, HTN, HLD.    PT Comments    Pt tolerated today's session well, remains limited by pain but is motivated to mobilize and progress with therapy. Pt requires increased time with all transfers but after a rest break is able to continue. Pt ambulated into the restroom for standing toileting, progressing to hallway ambulation with one standing rest break for the nurse tech to get his vitals, pt leaning against the wall for support, no seated rest breaks required. Pt fatigued after ambulation but recovers quickly once seated in his recliner and positioned comfortably. Acute PT will continue to follow pt during admission to progress mobility and activity tolerance, discharge recommendations remain appropriate.     Recommendations for follow up therapy are one component of a multi-disciplinary discharge planning process, led by the attending physician.  Recommendations may be updated based on patient status, additional functional criteria and insurance authorization.  Follow Up Recommendations       Assistance Recommended at Discharge Intermittent Supervision/Assistance  Patient can return home with the following A little help with walking and/or transfers;A little help with bathing/dressing/bathroom;Assistance with cooking/housework;Assist for transportation;Help with stairs or ramp for entrance    Equipment Recommendations  None recommended by PT    Recommendations for Other Services       Precautions / Restrictions Precautions Precautions: Fall Restrictions Weight Bearing Restrictions: No     Mobility  Bed Mobility Overal bed mobility: Needs Assistance Bed Mobility: Rolling, Sidelying to Sit Rolling: Supervision Sidelying to sit: Min guard       General bed mobility comments: increased time and use of bed rails, close guard for safety    Transfers Overall transfer level: Needs assistance Equipment used: Rolling walker (2 wheels) Transfers: Sit to/from Stand Sit to Stand: Min guard, From elevated surface           General transfer comment: minG for safety and balance with stand from elevated bed to simulate bed at home, cued for pushing up from bed with one UE    Ambulation/Gait Ambulation/Gait assistance: Min guard Gait Distance (Feet): 125 Feet (1 standing rest break for nurse tech to get vitals) Assistive device: Rolling walker (2 wheels) Gait Pattern/deviations: Step-through pattern, Decreased stride length, Trunk flexed, Wide base of support Gait velocity: decreased     General Gait Details: cueing for upright posture and forward gaze, self correcting during brief standing rest breaks but requiring cueing throughout ambulation   Stairs             Wheelchair Mobility    Modified Rankin (Stroke Patients Only)       Balance Overall balance assessment: Needs assistance Sitting-balance support: Feet supported Sitting balance-Leahy Scale: Fair     Standing balance support: During functional activity, Bilateral upper extremity supported, Reliant on assistive device for balance Standing balance-Leahy Scale: Poor Standing balance comment: relies on RW for support  Cognition Arousal/Alertness: Awake/alert Behavior During Therapy: WFL for tasks assessed/performed Overall Cognitive Status: Within  Functional Limits for tasks assessed                                          Exercises      General Comments General comments (skin integrity, edema, etc.): VSS      Pertinent Vitals/Pain Pain Assessment Pain Assessment: 0-10 Pain Score: 6  Pain Location: back with mobility, 2/10 at rest Pain Descriptors / Indicators: Discomfort, Guarding, Grimacing Pain Intervention(s): Limited activity within patient's tolerance, Monitored during session, Repositioned    Home Living                          Prior Function            PT Goals (current goals can now be found in the care plan section) Acute Rehab PT Goals Patient Stated Goal: to go home PT Goal Formulation: With patient Time For Goal Achievement: 12/06/22 Potential to Achieve Goals: Good Progress towards PT goals: Progressing toward goals    Frequency    Min 5X/week      PT Plan Current plan remains appropriate    Co-evaluation              AM-PAC PT "6 Clicks" Mobility   Outcome Measure  Help needed turning from your back to your side while in a flat bed without using bedrails?: A Little Help needed moving from lying on your back to sitting on the side of a flat bed without using bedrails?: A Little Help needed moving to and from a bed to a chair (including a wheelchair)?: A Little Help needed standing up from a chair using your arms (e.g., wheelchair or bedside chair)?: A Little Help needed to walk in hospital room?: A Little Help needed climbing 3-5 steps with a railing? : A Little 6 Click Score: 18    End of Session Equipment Utilized During Treatment: Gait belt Activity Tolerance: Patient limited by pain Patient left: in chair;with call bell/phone within reach Nurse Communication: Mobility status PT Visit Diagnosis: Muscle weakness (generalized) (M62.81);Pain Pain - part of body:  (Back)     Time: 4132-4401 PT Time Calculation (min) (ACUTE ONLY): 25  min  Charges:  $Gait Training: 8-22 mins $Therapeutic Activity: 8-22 mins                     Lindalou Hose, PT DPT Acute Rehabilitation Services Office [phone]    Leonie Man 11/24/2022, 4:11 PM

## 2022-11-24 NOTE — Discharge Summary (Signed)
Name: Marco Kennedy MRN: [id number] DOB: Mar 08, 1951 72 y.o. PCP: Maximiano Coss, MD  Date of Admission: 11/20/2022  1:54 PM Date of Discharge: 11/24/2022 Attending Physician: Dr. Antony Contras  Discharge Diagnosis: Principal Problem:   Vertebral osteomyelitis, acute recurrent (HCC) Active Problems:   Obesity, Class III, BMI 40-49.9 (morbid obesity) (HCC)   Discitis of thoracic region   Hypoalbuminemia due to protein-calorie malnutrition (HCC)   Bacteremia    Discharge Medications: Allergies as of 11/24/2022   No Known Allergies      Medication List     STOP taking these medications    cefadroxil 500 MG capsule Commonly known as: DURICEF       TAKE these medications    acetaminophen 500 MG tablet Commonly known as: TYLENOL Take 1,000 mg by mouth 2 (two) times daily as needed for moderate pain, fever or headache.   amLODipine 10 MG tablet Commonly known as: NORVASC Take 1 tablet (10 mg total) by mouth daily.   bisacodyl 5 MG EC tablet Commonly known as: Dulcolax Take 1 tablet (5 mg total) by mouth daily as needed for moderate constipation. Limit use to less than 7 days in a row   carvedilol 3.125 MG tablet Commonly known as: COREG Take 1 tablet (3.125 mg total) by mouth 2 (two) times daily with a meal.   ceFAZolin  IVPB Commonly known as: ANCEF Inject 2 g into the vein every 8 (eight) hours. Indication:  E coli epidural abscess First Dose: Yes Last Day of Therapy:  01/01/23 Labs - Once weekly:  CBC/D and BMP, Labs - Every other week:  ESR and CRP Method of administration: IV Push Method of administration may be changed at the discretion of home infusion pharmacist based upon assessment of the patient and/or caregiver's ability to self-administer the medication ordered.   diclofenac 75 MG EC tablet Commonly known as: VOLTAREN Take 75 mg by mouth 2 (two) times daily.   DULoxetine 60 MG capsule Commonly known as: CYMBALTA TAKE 1 CAPSULE (60 MG  TOTAL) BY MOUTH AT BEDTIME. What changed: See the new instructions.   metFORMIN 750 MG 24 hr tablet Commonly known as: GLUCOPHAGE-XR Take 750 mg by mouth 2 (two) times daily.   multivitamin tablet Take 1 tablet by mouth at bedtime.   oxyCODONE 5 MG immediate release tablet Commonly known as: Oxy IR/ROXICODONE Take 1 tablet (5 mg total) by mouth every 6 (six) hours as needed for severe pain.   polyethylene glycol 17 g packet Commonly known as: MIRALAX / GLYCOLAX Take 17 g by mouth daily.   pravastatin 20 MG tablet Commonly known as: PRAVACHOL Take 20 mg by mouth daily.   VITAMIN D-3 PO Take 1 capsule by mouth at bedtime.               Discharge Care Instructions  (From admission, onward)           Start     Ordered   11/23/22 0000  Change dressing on IV access line weekly and PRN  (Home infusion instructions - [facility] )        11/23/22 1539            Disposition and follow-up:   Mr.Jenson E Tufaro was discharged from [facility] in Good condition.  At the hospital follow up visit please address:  1.  Follow-up:  a.  Ensure no return of fevers or other infective signs as well as any progression in his back pain.  2.  Labs / imaging needed at time of follow-up: none  3.  Pending labs/ test needing follow-up: blood culture  Follow-up Appointments:  Follow-up Information     Vu, Tonita Phoenix, MD. Call.   Specialty: Infectious Diseases Contact information: [address] [place] [address] [phone]         Maximiano Coss, MD. Schedule an appointment as soon as possible for a visit.   Specialty: Family Medicine Contact information: [facility] [address] [place] [address] [phone]         [facility] Follow up.   Why: [facility] will provide home health services.  They will call you in the next 24-48 hours to set up  services - first visit to likely be on Thursday, 11/25/22. Contact information: [address] [place] [address] [phone]         [facility] Follow up.   Why: [facility] will provide home antibiotics.                Hospital Course by problem list: Marco Kennedy is a 72 y.o. male with hx of T2DM, hypertension, and degenerative lumbar spinal stenosis s/p extensive thoracal lumbar surgery with hardware in place complicated by vertebral osteomyelitis and paravertebral abscess in 2022 on chronic suppressive antibiotic therapy who presented with progressive back pain and fevers then found to have gram-negative rod bacteremia.   # Gram-negative bacteremia # Recurrent vertebral osteomyelitis # Paravertebral abscesses Patient sent to [facility] for ID and neurosurgery eval after developing progressive back pain and fevers as well as with pansensitive E. coli bacteremia from outside hospital.  MRI of the spine showed recurrent T10-T11 osteomyelitis discitis as well as bilateral paravertebral abscesses. Remained hemodynamically stable and blood cultures here with no growth at 4 days.  Did not need urgent surgical intervention and does not currently have any surgical intervention planned.  Narrowed from broad-spectrum antibiotics to cefazolin once we had sensitivity data from outside hospital. He will continue IV cefazolin for 6-8 weeks and then be on lifelong high-dose cefadroxil.  Will be discharged with short course of as needed oxycodone 5 mg.  PICC line was placed and he will continue cefazolin for until 01/01/2023.  Will follow-up with ID and neurosurgery outpatient.   #Constipation Roughly 5 days of constipation after starting opiates.  Had a good bowel movement after tap water enema.  Discharged on daily or twice daily MiraLAX as needed as well as Dulcolax as needed if MiraLAX is not working.  #Hypoxemia O2 sats between 90 and 94 for a few days during admission but he remained on  room air without symptoms.  Added incentive spirometer.  Most likely atelectasis and did not require any further workup without any symptoms during admission.  Would recommend chest x-ray at follow-up if he is having any dyspnea at home.   #HTN Continued amlodipine 10 mg daily and carvedilol 3.125 mg twice daily. BP stable during admission.   #T2DM  A1c 6.9.  On metformin 750 mg twice daily at home.  Did well on sliding scale insulin here and will resume metformin on discharge.  Will defer any changes to his primary care provider.  #Prerenal AKI, resolved 1.31 on admission and stable now at 0.8-0.9 after IV fluids.   Discharge Subjective: Doing well this morning with improved abdominal bloating after having a significant bowel movement yesterday following tapwater enema. He also thinks that his back pain is improving  as well. He is ready to go home and resume physical therapy.   Discharge Exam:   BP (!) 158/20 (BP Location: Right Leg)   Pulse 75   Temp 98.6 F (37 C)   Resp 18   Ht 5\' 8"  (1.727 m)   Wt 131.5 kg   SpO2 96%   BMI 44.09 kg/m  Constitutional: Well-appearing elderly male. In no acute distress. Cardio:Regular rate and rhythm.  Pulm: Normal work of breathing on room air. Abdomen: Soft, non-tender, nondistended, positive bowel sounds. MSK: Trace lower extremity edema. Skin:Warm and dry. Neuro:Alert and oriented x3.  No focal deficits  Pertinent Labs, Studies, and Procedures:     Latest Ref Rng & Units 11/23/2022    3:21 AM 11/22/2022    4:41 AM 11/21/2022    5:41 AM  CBC  WBC 4.0 - 10.5 K/uL 16.5  13.0  11.3   Hemoglobin 13.0 - 17.0 g/dL 09.6  04.5  40.9   Hematocrit 39.0 - 52.0 % 37.5  37.6  39.9   Platelets 150 - 400 K/uL 230  229  237        Latest Ref Rng & Units 11/23/2022    3:21 AM 11/22/2022    4:41 AM 11/21/2022    5:41 AM  CMP  Glucose 70 - 99 mg/dL 811  914  782   BUN 8 - 23 mg/dL 14  14  17    Creatinine 0.61 - 1.24 mg/dL 9.56  2.13  0.86   Sodium  135 - 145 mmol/L 131  132  133   Potassium 3.5 - 5.1 mmol/L 3.6  3.9  3.5   Chloride 98 - 111 mmol/L 95  96  97   CO2 22 - 32 mmol/L 21  25  26    Calcium 8.9 - 10.3 mg/dL 8.0  7.9  8.1     MR Cervical Spine W and Wo Contrast  Result Date: 11/21/2022 CLINICAL DATA:  Left-sided back pain at the thoracolumbar junction for the past week. 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 Recently completed antibiotics two weeks ago. Gram-negative rod bacteremia. EXAM: MRI CERVICAL, THORACIC AND LUMBAR SPINE WITHOUT AND WITH CONTRAST TECHNIQUE: Multiplanar and multiecho pulse sequences of the cervical spine, to include the craniocervical junction and cervicothoracic junction, and thoracic and lumbar spine, were obtained without and with intravenous contrast. CONTRAST:  10mL GADAVIST GADOBUTROL 1 MMOL/ML IV SOLN COMPARISON:  CT chest dated May 03, 2022. MRI thoracic spine dated April 16, 2022. MRI lumbar spine dated March 21, 2021. FINDINGS: MRI CERVICAL SPINE FINDINGS Alignment: Trace retrolisthesis at C4-C5. Trace anterolisthesis at C6-C7. Vertebrae: Fluid and enhancement within the C3-C4 disc space with normal adjacent endplates and no paravertebral inflammatory change. No fracture or focal bone lesion. 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 Remaining cervical spinal cord is unremarkable. No abnormal intrathecal enhancement. Posterior Fossa, vertebral arteries, paraspinal tissues: Negative. Disc levels: C2-C3: Negative disc. Mild bilateral uncovertebral hypertrophy. Severe left and moderate right facet arthropathy. Moderate bilateral neuroforaminal stenosis. No spinal canal stenosis. C3-C4: Mild disc bulging with superimposed small central disc protrusion. Severe right and moderate left facet uncovertebral hypertrophy. Severe bilateral neuroforaminal stenosis. No spinal canal stenosis. C4-C5: Broad-based  posterior disc osteophyte complex with superimposed central disc protrusion. Bilateral facet uncovertebral hypertrophy. Moderate spinal canal stenosis. Severe bilateral neuroforaminal stenosis. C5-C6: Mild disc bulging. Moderate bilateral facet uncovertebral hypertrophy. Mild spinal canal stenosis. Severe bilateral neuroforaminal stenosis. C6-C7: Mild disc  bulging. Superimposed small right subarticular disc extrusion migrating superiorly and small left foraminal disc protrusion. Advanced bilateral facet uncovertebral hypertrophy. Moderate bilateral neuroforaminal stenosis. No spinal canal stenosis. C7-T1: Mild disc bulging eccentric to the left. Moderate left and mild right facet arthropathy. No stenosis. MRI THORACIC SPINE FINDINGS Alignment:  Physiologic. Vertebrae: Increased abnormal intradiscal fluid at T10-T11 with similar patchy marrow edema, low T1 signal, and heterogeneous enhancement of the T10 vertebral body. No definite progressive bony destruction. However, there is new paravertebral inflammation with bilateral rim enhancing fluid collections measuring 2.7 x 2.1 cm on the left and 2.7 x 1.2 cm on the right (series 29, image 18). Residual intradiscal fluid and enhancement at T9-T10 is unchanged. No fracture or suspicious bone lesion. Cord: The thoracic spinal cord is unremarkable to T8-T9. Below T9, the cord is not well evaluated due to artifact. No abnormal intrathecal enhancement. Paraspinal and other soft tissues: Small bilateral pleural effusions and bilateral lower lobe atelectasis. Disc levels: Posterior fusion from T6 to the sacrum. At T5-T6, there is a new large left subarticular disc extrusion migrating superiorly, with worsening left-sided facet arthropathy and new small joint effusion. Resultant new moderate left neuroforaminal stenosis. MRI LUMBAR SPINE FINDINGS Segmentation:  Standard. Alignment: Unchanged trace retrolisthesis at T12-L1. Unchanged trace anterolisthesis at L3-L4 and L4-L5.  Vertebrae: Posterior fusion to the sacrum. Interbody fusion from L2-L3 through L5-S1. No fracture, evidence of discitis, or suspicious bone lesion. Conus medullaris and cauda equina: Not well evaluated due to hardware artifact. Paraspinal and other soft tissues: No paravertebral inflammatory change. Disc levels: The upper lumbar spinal canal is not well evaluated due to susceptibility artifact. The spinal canal below L1-L2 is widely patent. IMPRESSION: CERVICAL SPINE: 1. Fluid and enhancement within the C3-C4 disc space with normal adjacent endplates and no paravertebral inflammatory change, favored to be degenerative in etiology. 2. 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 3. Multilevel cervical spondylosis as described above. Moderate or severe bilateral neuroforaminal stenosis from C2-C3 through C6-C7. Moderate spinal canal stenosis at C4-C5. THORACIC SPINE: 1. Recurrent T10-T11 osteomyelitis discitis with paravertebral inflammatory change and bilateral paravertebral abscesses measuring 2.7 x 2.1 cm on the left and 2.7 x 1.2 cm on the right. Evaluation for epidural involvement is limited due to hardware artifact obscuring the lower thoracic spinal canal. 2. Similar chronic sequela of osteomyelitis-discitis at T9-T10 without recurrence. 3. New adjacent segment disease at T5-T6 with large left subarticular disc extrusion, worsening left-sided facet arthropathy, and new moderate left neuroforaminal stenosis. 4. Recurrent small bilateral pleural effusions. LUMBAR SPINE: 1. No evidence of osteomyelitis-discitis. 2. Postsurgical changes from prior thoracolumbosacral fusion. Electronically Signed   By: Obie Dredge M.D.   On: 11/21/2022 14:52   MR THORACIC SPINE W WO CONTRAST  Result Date: 11/21/2022 CLINICAL DATA:  Left-sided back pain at the thoracolumbar junction for the past week. History of prior thoracic osteomyelitis discitis requiring decompression and  fusion. Recently completed antibiotics two weeks ago. Gram-negative rod bacteremia. EXAM: MRI CERVICAL, THORACIC AND LUMBAR SPINE WITHOUT AND WITH CONTRAST TECHNIQUE: Multiplanar and multiecho pulse sequences of the cervical spine, to include the craniocervical junction and cervicothoracic junction, and thoracic and lumbar spine, were obtained without and with intravenous contrast. CONTRAST:  10mL GADAVIST GADOBUTROL 1 MMOL/ML IV SOLN COMPARISON:  CT chest dated May 03, 2022. MRI thoracic spine dated April 16, 2022. MRI lumbar spine dated March 21, 2021. FINDINGS: MRI CERVICAL SPINE FINDINGS Alignment: Trace retrolisthesis at C4-C5. Trace anterolisthesis at C6-C7.  Vertebrae: Fluid and enhancement within the C3-C4 disc space with normal adjacent endplates and no paravertebral inflammatory change. No fracture or focal bone lesion. 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 Remaining cervical spinal cord is unremarkable. No abnormal intrathecal enhancement. Posterior Fossa, vertebral arteries, paraspinal tissues: Negative. Disc levels: C2-C3: Negative disc. Mild bilateral uncovertebral hypertrophy. Severe left and moderate right facet arthropathy. Moderate bilateral neuroforaminal stenosis. No spinal canal stenosis. C3-C4: Mild disc bulging with superimposed small central disc protrusion. Severe right and moderate left facet uncovertebral hypertrophy. Severe bilateral neuroforaminal stenosis. No spinal canal stenosis. C4-C5: Broad-based posterior disc osteophyte complex with superimposed central disc protrusion. Bilateral facet uncovertebral hypertrophy. Moderate spinal canal stenosis. Severe bilateral neuroforaminal stenosis. C5-C6: Mild disc bulging. Moderate bilateral facet uncovertebral hypertrophy. Mild spinal canal stenosis. Severe bilateral neuroforaminal stenosis. C6-C7: Mild disc bulging. Superimposed small right subarticular disc extrusion  migrating superiorly and small left foraminal disc protrusion. Advanced bilateral facet uncovertebral hypertrophy. Moderate bilateral neuroforaminal stenosis. No spinal canal stenosis. C7-T1: Mild disc bulging eccentric to the left. Moderate left and mild right facet arthropathy. No stenosis. MRI THORACIC SPINE FINDINGS Alignment:  Physiologic. Vertebrae: Increased abnormal intradiscal fluid at T10-T11 with similar patchy marrow edema, low T1 signal, and heterogeneous enhancement of the T10 vertebral body. No definite progressive bony destruction. However, there is new paravertebral inflammation with bilateral rim enhancing fluid collections measuring 2.7 x 2.1 cm on the left and 2.7 x 1.2 cm on the right (series 29, image 18). Residual intradiscal fluid and enhancement at T9-T10 is unchanged. No fracture or suspicious bone lesion. Cord: The thoracic spinal cord is unremarkable to T8-T9. Below T9, the cord is not well evaluated due to artifact. No abnormal intrathecal enhancement. Paraspinal and other soft tissues: Small bilateral pleural effusions and bilateral lower lobe atelectasis. Disc levels: Posterior fusion from T6 to the sacrum. At T5-T6, there is a new large left subarticular disc extrusion migrating superiorly, with worsening left-sided facet arthropathy and new small joint effusion. Resultant new moderate left neuroforaminal stenosis. MRI LUMBAR SPINE FINDINGS Segmentation:  Standard. Alignment: Unchanged trace retrolisthesis at T12-L1. Unchanged trace anterolisthesis at L3-L4 and L4-L5. Vertebrae: Posterior fusion to the sacrum. Interbody fusion from L2-L3 through L5-S1. No fracture, evidence of discitis, or suspicious bone lesion. Conus medullaris and cauda equina: Not well evaluated due to hardware artifact. Paraspinal and other soft tissues: No paravertebral inflammatory change. Disc levels: The upper lumbar spinal canal is not well evaluated due to susceptibility artifact. The spinal canal below  L1-L2 is widely patent. IMPRESSION: CERVICAL SPINE: 1. Fluid and enhancement within the C3-C4 disc space with normal adjacent endplates and no paravertebral inflammatory change, favored to be degenerative in etiology. 2. 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 3. Multilevel cervical spondylosis as described above. Moderate or severe bilateral neuroforaminal stenosis from C2-C3 through C6-C7. Moderate spinal canal stenosis at C4-C5. THORACIC SPINE: 1. Recurrent T10-T11 osteomyelitis discitis with paravertebral inflammatory change and bilateral paravertebral abscesses measuring 2.7 x 2.1 cm on the left and 2.7 x 1.2 cm on the right. Evaluation for epidural involvement is limited due to hardware artifact obscuring the lower thoracic spinal canal. 2. Similar chronic sequela of osteomyelitis-discitis at T9-T10 without recurrence. 3. New adjacent segment disease at T5-T6 with large left subarticular disc extrusion, worsening left-sided facet arthropathy, and new moderate left neuroforaminal stenosis. 4. Recurrent small bilateral pleural effusions. LUMBAR SPINE: 1. No evidence of osteomyelitis-discitis. 2. Postsurgical changes  from prior thoracolumbosacral fusion. Electronically Signed   By: Obie Dredge M.D.   On: 11/21/2022 14:52   MR Lumbar Spine W Wo Contrast  Result Date: 11/21/2022 CLINICAL DATA:  Left-sided back pain at the thoracolumbar junction for the past week. 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 Recently completed antibiotics two weeks ago. Gram-negative rod bacteremia. EXAM: MRI CERVICAL, THORACIC AND LUMBAR SPINE WITHOUT AND WITH CONTRAST TECHNIQUE: Multiplanar and multiecho pulse sequences of the cervical spine, to include the craniocervical junction and cervicothoracic junction, and thoracic and lumbar spine, were obtained without and with intravenous contrast. CONTRAST:  10mL GADAVIST GADOBUTROL 1  MMOL/ML IV SOLN COMPARISON:  CT chest dated May 03, 2022. MRI thoracic spine dated April 16, 2022. MRI lumbar spine dated March 21, 2021. FINDINGS: MRI CERVICAL SPINE FINDINGS Alignment: Trace retrolisthesis at C4-C5. Trace anterolisthesis at C6-C7. Vertebrae: Fluid and enhancement within the C3-C4 disc space with normal adjacent endplates and no paravertebral inflammatory change. No fracture or focal bone lesion. 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 Remaining cervical spinal cord is unremarkable. No abnormal intrathecal enhancement. Posterior Fossa, vertebral arteries, paraspinal tissues: Negative. Disc levels: C2-C3: Negative disc. Mild bilateral uncovertebral hypertrophy. Severe left and moderate right facet arthropathy. Moderate bilateral neuroforaminal stenosis. No spinal canal stenosis. C3-C4: Mild disc bulging with superimposed small central disc protrusion. Severe right and moderate left facet uncovertebral hypertrophy. Severe bilateral neuroforaminal stenosis. No spinal canal stenosis. C4-C5: Broad-based posterior disc osteophyte complex with superimposed central disc protrusion. Bilateral facet uncovertebral hypertrophy. Moderate spinal canal stenosis. Severe bilateral neuroforaminal stenosis. C5-C6: Mild disc bulging. Moderate bilateral facet uncovertebral hypertrophy. Mild spinal canal stenosis. Severe bilateral neuroforaminal stenosis. C6-C7: Mild disc bulging. Superimposed small right subarticular disc extrusion migrating superiorly and small left foraminal disc protrusion. Advanced bilateral facet uncovertebral hypertrophy. Moderate bilateral neuroforaminal stenosis. No spinal canal stenosis. C7-T1: Mild disc bulging eccentric to the left. Moderate left and mild right facet arthropathy. No stenosis. MRI THORACIC SPINE FINDINGS Alignment:  Physiologic. Vertebrae: Increased abnormal intradiscal fluid at T10-T11 with similar patchy  marrow edema, low T1 signal, and heterogeneous enhancement of the T10 vertebral body. No definite progressive bony destruction. However, there is new paravertebral inflammation with bilateral rim enhancing fluid collections measuring 2.7 x 2.1 cm on the left and 2.7 x 1.2 cm on the right (series 29, image 18). Residual intradiscal fluid and enhancement at T9-T10 is unchanged. No fracture or suspicious bone lesion. Cord: The thoracic spinal cord is unremarkable to T8-T9. Below T9, the cord is not well evaluated due to artifact. No abnormal intrathecal enhancement. Paraspinal and other soft tissues: Small bilateral pleural effusions and bilateral lower lobe atelectasis. Disc levels: Posterior fusion from T6 to the sacrum. At T5-T6, there is a new large left subarticular disc extrusion migrating superiorly, with worsening left-sided facet arthropathy and new small joint effusion. Resultant new moderate left neuroforaminal stenosis. MRI LUMBAR SPINE FINDINGS Segmentation:  Standard. Alignment: Unchanged trace retrolisthesis at T12-L1. Unchanged trace anterolisthesis at L3-L4 and L4-L5. Vertebrae: Posterior fusion to the sacrum. Interbody fusion from L2-L3 through L5-S1. No fracture, evidence of discitis, or suspicious bone lesion. Conus medullaris and cauda equina: Not well evaluated due to hardware artifact. Paraspinal and other soft tissues: No paravertebral inflammatory change. Disc levels: The upper lumbar spinal canal is not well evaluated due to susceptibility artifact. The spinal canal below L1-L2 is widely patent. IMPRESSION: CERVICAL SPINE: 1. Fluid and enhancement within the C3-C4 disc space  with normal adjacent endplates and no paravertebral inflammatory change, favored to be degenerative in etiology. 2. 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 3. Multilevel cervical spondylosis as described above. Moderate or severe bilateral neuroforaminal stenosis from C2-C3  through C6-C7. Moderate spinal canal stenosis at C4-C5. THORACIC SPINE: 1. Recurrent T10-T11 osteomyelitis discitis with paravertebral inflammatory change and bilateral paravertebral abscesses measuring 2.7 x 2.1 cm on the left and 2.7 x 1.2 cm on the right. Evaluation for epidural involvement is limited due to hardware artifact obscuring the lower thoracic spinal canal. 2. Similar chronic sequela of osteomyelitis-discitis at T9-T10 without recurrence. 3. New adjacent segment disease at T5-T6 with large left subarticular disc extrusion, worsening left-sided facet arthropathy, and new moderate left neuroforaminal stenosis. 4. Recurrent small bilateral pleural effusions. LUMBAR SPINE: 1. No evidence of osteomyelitis-discitis. 2. Postsurgical changes from prior thoracolumbosacral fusion. Electronically Signed   By: Obie Dredge M.D.   On: 11/21/2022 14:52   DG Chest Port 1 View  Result Date: 11/20/2022 CLINICAL DATA:  Chest pain.  Questionable sepsis. EXAM: PORTABLE CHEST 1 VIEW COMPARISON:  04/01/2021 FINDINGS: Stable cardiomediastinal contours. Low lung volumes. No signs of pleural effusion or edema. No airspace opacities identified. Spinal fixation hardware noted within the mid and lower thoracic spine. No acute osseous findings. IMPRESSION: Low lung volumes. No acute findings. Electronically Signed   By: Signa Kell M.D.   On: 11/20/2022 17:23     Discharge Instructions: Discharge Instructions     [facility] pharmacist to adjust dose for Vancomycin, Aminoglycosides and other anti-infective therapies as requested by physician.   Complete by: As directed    [facility] to provide Cath Flo 2mg    Complete by: As directed    Administer for PICC line occlusion and as ordered by physician for other access device issues.   Anaphylaxis Kit: Provided to treat any anaphylactic reaction to the medication being provided to the patient if First Dose or when requested by physician    Complete by: As directed    Epinephrine 1mg /ml vial / amp: Administer 0.3mg  (0.64ml) subcutaneously once for moderate to severe anaphylaxis, nurse to call physician and pharmacy when reaction occurs and call 911 if needed for immediate care   Diphenhydramine 50mg /ml IV vial: Administer 25-50mg  IV/IM PRN for first dose reaction, rash, itching, mild reaction, nurse to call physician and pharmacy when reaction occurs   Sodium Chloride 0.9% NS IV: Administer if needed for hypovolemic blood pressure drop or as ordered by physician after call to physician with anaphylactic reaction   Call MD for:  difficulty breathing, headache or visual disturbances   Complete by: As directed    Call MD for:  extreme fatigue   Complete by: As directed    Call MD for:  persistant dizziness or light-headedness   Complete by: As directed    Call MD for:  persistant nausea and vomiting   Complete by: As directed    Call MD for:  severe uncontrolled pain   Complete by: As directed    Call MD for:  temperature >100.4   Complete by: As directed    Change dressing on IV access line weekly and PRN   Complete by: As directed    Diet - low sodium heart healthy   Complete by: As directed    Flush IV access with Sodium Chloride 0.9% and Heparin 10 units/ml or 100 units/ml   Complete by: As directed  Home infusion instructions - [facility]   Complete by: As directed    Instructions: Flush IV access with Sodium Chloride 0.9% and Heparin 10units/ml or 100units/ml   Change dressing on IV access line: Weekly and PRN   Instructions Cath Flo 2mg : Administer for PICC Line occlusion and as ordered by physician for other access device   [facility] pharmacist to adjust dose for: Vancomycin, Aminoglycosides and other anti-infective therapies as requested by physician   Increase activity slowly   Complete by: As directed    Method of administration may be changed at the discretion of home infusion  pharmacist based upon assessment of the patient and/or caregiver's ability to self-administer the medication ordered   Complete by: As directed        Signed: Rocky Morel, DO 11/24/2022, 10:35 AM   Pager: [phone]

## 2022-11-25 LAB — CULTURE, BLOOD (ROUTINE X 2)

## 2022-12-09 ENCOUNTER — Other Ambulatory Visit: Payer: Self-pay

## 2022-12-09 ENCOUNTER — Encounter: Payer: Self-pay | Admitting: Internal Medicine

## 2022-12-09 ENCOUNTER — Telehealth: Payer: Self-pay

## 2022-12-09 ENCOUNTER — Ambulatory Visit: Payer: Medicare Other | Admitting: Internal Medicine

## 2022-12-09 VITALS — BP 125/86 | HR 96 | Resp 16 | Ht 68.0 in | Wt 281.0 lb

## 2022-12-09 DIAGNOSIS — M4620 Osteomyelitis of vertebra, site unspecified: Secondary | ICD-10-CM

## 2022-12-09 DIAGNOSIS — T847XXD Infection and inflammatory reaction due to other internal orthopedic prosthetic devices, implants and grafts, subsequent encounter: Secondary | ICD-10-CM

## 2022-12-09 DIAGNOSIS — L0291 Cutaneous abscess, unspecified: Secondary | ICD-10-CM | POA: Diagnosis not present

## 2022-12-09 NOTE — Patient Instructions (Signed)
We are waiting on some of your opat labs   You seem to be doing better now   The picc/iv antibiotics can be stopped sometimes even before 7/6 if the hh nurse comes a little earlier and you can resume oral antibiotics 1000 mg twice a day again for the next 3-6 months before decreasing dose to 500 mg twice a day  See me sometimes mid-end july

## 2022-12-09 NOTE — Progress Notes (Signed)
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, complicated by late (12/2020) T7-T11 epidural abscess/vertebral OM requiring I&D 01/13/2021 (cx ecoli; blood cx negative), repeated admissions for complications including bilateral empyema/retroperitoneal abscess s/p drainage (fluid cx negative) presumed "spill over" from vertebral process (blood cx negative; tte 01/21/21 no evidence valve vegetation), right knee effusion with negative fluid cx 01/28/2021, another I&D 03/14/2021 & 03/31/2021 and more hardware stabilization for worsening epidural abscess at T9-11 level (operative cx and bcx negative), who done well with 20 months of antibiotics suppression, but unfortunately relapsed after 10 days of trial off abx starting 11/04/2021 (fever, e coli bacteremia, 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 He has been walking with front wheel walker since recovered from this process  Culture: 11/19/22 OSH blood cx ecoli (S cefazolin, cipro, bactrim)  11/20/22 blood cx negative 03/14/2021, 03/31/2021 surgical (spinal) fluid swab cx negative 03/14/2021 bcx negative 01/28/21 right knee fluid cx negative 01/26/21 bcx negative 01/20/21, 01/23/21 bcx negative 01/20/21 right and left pleural fluid cx negative 01/13/21 thoracolumbar operative fluid cx ecoli (S cefazolin, cipro, bactrim) 01/10/21 bcx negative   He had sepsis and worsening back pain about 10 days after stopping cefadroxil. Seen initially in [place], [place] ER and blood cx there was positive for ecoli  Of note, the CLSI had updated cefazolin mic to be </=2 but the cards from both [place] and [place], [place] has MIC</=4 (can't go below that) and sensitivity is presumed for cefazolin  5/26 mri of the entire spine does show new  paravertebral 2.7 fluid collection and recurrent t9-11 discitis/om. His repeat 11/20/22 blood cx here so far is negative  Neurosurgery had evaluated and given no new lower extremities neurological deficit or hardware failure, no plan to do any surgery at this time.   For now, agree given relative lack of concerning symptomatology and presence of small fluid collection, would do antibiotics and monitor   As he had failed a trial off abx rather quickly, will commit him to life-long suppression antibiotics treatment   -------------- 12/09/22 id clinic assessment Patient improved pain since back on antibiotics, since hospital discharge Mid back pain worse in morning waking up and getting back to sleep at night, but manageable during day. Tylenol once in a while.  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, complicated by late (12/2020) T7-T11 epidural abscess/vertebral OM requiring I&D 01/13/2021 (cx ecoli; blood cx negative), repeated admissions for complications including bilateral empyema/retroperitoneal abscess s/p drainage (fluid cx negative) presumed "spill over" from vertebral process (blood cx negative; tte 01/21/21 no evidence valve vegetation), right knee effusion with negative fluid cx 01/28/2021, another I&D 03/14/2021 & 03/31/2021 and more hardware stabilization for worsening epidural abscess at T9-11 level (operative cx and bcx negative), who done well with 20 months of antibiotics suppression, but unfortunately relapsed after 10 days of trial off abx starting 11/04/2021 (fever, e coli bacteremia, 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 He has been walking with front wheel walker since recovered from this process. He is here today for hospital follow up   Culture: 11/19/22 OSH blood cx ecoli (S cefazolin, cipro, bactrim)  11/20/22 blood cx negative 03/14/2021, 03/31/2021 surgical (spinal) fluid swab cx negative 03/14/2021 bcx negative 01/28/21 right knee fluid cx negative 01/26/21 bcx negative 01/20/21, 01/23/21 bcx negative 01/20/21 right and left pleural fluid cx negative 01/13/21 thoracolumbar operative fluid cx ecoli (S cefazolin, cipro, bactrim) 01/10/21 bcx negative   He had sepsis and worsening back pain about 10 days after stopping cefadroxil. Seen initially in [place], [place] ER and blood cx there was positive for ecoli  Of note, the CLSI had updated cefazolin mic to be </=2 but the cards from both [place] and [place], [place] has MIC</=4 (can't go below that) and sensitivity is presumed for cefazolin  5/26 mri of the entire spine does show new paravertebral 2.7 fluid collection and recurrent t9-11 discitis/om. His repeat 11/20/22 blood cx here so far is negative  Neurosurgery had evaluated and given no new lower extremities neurological deficit or hardware failure, no plan to do any surgery at this time.   For now, agree given relative lack of concerning symptomatology and presence of small fluid collection, would do antibiotics and monitor   As he had failed a trial off abx rather quickly, will commit him to life-long suppression antibiotics treatment   --------------- 12/09/22 id clinic f/u See a/p above for detail of today's visit   Review of Systems: ROS All other ROS was negative, except mentioned above     OBJECTIVE: Vitals:   12/09/22 1020  BP: 125/86  Pulse: 96  Resp: 16  SpO2: 96%  Weight: 281 lb (127.5 kg)  Height: 5\' 8"  (1.727 m)   Body mass index is 42.73 kg/m.  Physical Exam  General/constitutional: no distress, pleasant HEENT: Normocephalic, PER, Conj Clear,  EOMI, Oropharynx clear Neck supple CV: rrr no mrg Lungs: clear to auscultation, normal respiratory effort Abd: Soft, Nontender Ext: no edema Skin: No Rash Neuro: nonfocal MSK: stable bilateral LE 4-5/5 strength  Central line -- rue picc site no erythema/purulence  Lab Results Lab Results  Component Value Date   WBC 16.5 (H) 11/23/2022   HGB 12.8 (L) 11/23/2022   HCT 37.5 (L) 11/23/2022   MCV 87.8 11/23/2022   PLT 230 11/23/2022    Lab Results  Component Value Date   CREATININE 0.83 11/23/2022   BUN 14 11/23/2022   NA 131 (L) 11/23/2022   K 3.6 11/23/2022   CL 95 (L) 11/23/2022   CO2 21 (L) 11/23/2022    Lab Results  Component Value Date   ALT 28 11/20/2022   AST 30 11/20/2022   ALKPHOS 67 11/20/2022   BILITOT 0.6 11/20/2022      Microbiology: No results found for this or any previous visit (from the past 240 hour(s)).    Serology:   Imaging: If present, new imagings (plain films, ct scans, and mri) have been personally visualized and interpreted; radiology reports have been reviewed. Decision making incorporated into the Impression / Recommendations.   11/20/22 cxr FINDINGS: Stable cardiomediastinal contours. Low lung volumes. No signs of pleural effusion or edema. No airspace opacities identified. Spinal fixation hardware noted within the mid and lower thoracic spine. No acute osseous findings.   IMPRESSION: Low lung volumes. No acute findings.   11/21/22 mri c, t, and lumbar spine IMPRESSION: CERVICAL SPINE:   1. Fluid and enhancement within  the C3-C4 disc space with normal adjacent endplates and no paravertebral inflammatory change, favored to be degenerative in etiology. 2. 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 3. Multilevel cervical spondylosis as described above. Moderate or severe bilateral neuroforaminal stenosis from C2-C3 through C6-C7. Moderate spinal canal stenosis at C4-C5.    THORACIC SPINE:   1. Recurrent T10-T11 osteomyelitis discitis with paravertebral inflammatory change and bilateral paravertebral abscesses measuring 2.7 x 2.1 cm on the left and 2.7 x 1.2 cm on the right. Evaluation for epidural involvement is limited due to hardware artifact obscuring the lower thoracic spinal canal. 2. Similar chronic sequela of osteomyelitis-discitis at T9-T10 without recurrence. 3. New adjacent segment disease at T5-T6 with large left subarticular disc extrusion, worsening left-sided facet arthropathy, and new moderate left neuroforaminal stenosis. 4. Recurrent small bilateral pleural effusions.   LUMBAR SPINE:   1. No evidence of osteomyelitis-discitis. 2. Postsurgical changes from prior thoracolumbosacral fusion.      03/2022 thoracic spine mri 1. Interval superior extension of the thoracolumbar fusion to include T6 through the sacrum. 2. The previously demonstrated changes of diskitis and osteomyelitis at T9-10 and T10-11 have substantially improved, and there is no evidence of residual or recurrent epidural or paraspinal fluid collection. There are some residual marrow changes within the T10 vertebral body, but no progressive bone destruction. 3. No new osseous abnormalities. 4. Possible subpleural right lower lobe pulmonary nodule. Further evaluation with chest CT recommended.     Raymondo Band, MD [facility] [phone] pager    12/09/2022, 10:45 AM

## 2022-12-09 NOTE — Telephone Encounter (Signed)
Patient may have PICC pulled after last dose on 12/28/2022 to avoid going into holiday weekend. Advised [facility] and [facility]. Per Dr Renold Don.

## 2022-12-21 ENCOUNTER — Telehealth: Payer: Self-pay

## 2022-12-21 MED ORDER — CEFADROXIL 500 MG PO CAPS: 1000.0000 mg | ORAL_CAPSULE | Freq: Two times a day (BID) | ORAL | 5 refills | Status: DC

## 2022-12-21 NOTE — Telephone Encounter (Signed)
Received call from [place], [place] regarding patient IV end date. States that patient had voiced concerns about when his picc will be pulled due to holiday, and how he would like to have picc pulled early.  Per Durwin Nora, NP note okay to end antibiotics on 7/4 and have picc pulled 7/5. Relayed this to RN who was able to take verbal order. Juanita Laster, RMA

## 2022-12-21 NOTE — Telephone Encounter (Signed)
Spoke with patient regarding therapy plan. Understands that rx for oral Cefadroxil has been called into pharmacy. Would like prescription sent to [facility] in [place], [place]. Prescription resent.  Does not have any questions about picc line end date.  Juanita Laster, RMA

## 2022-12-21 NOTE — Addendum Note (Signed)
Addended by: Juanita Laster on: 12/21/2022 02:29 PM   Modules accepted: Orders

## 2022-12-21 NOTE — Addendum Note (Signed)
Addended by: Blanchard Kelch on: 12/21/2022 01:38 PM   Modules accepted: Orders

## 2022-12-21 NOTE — Telephone Encounter (Signed)
Patient called requesting IV abx possibly end on 12/30/22 and picc be removed on 12/31/22/ Patient's end date is on 01/01/23 and nursing will not come out until 01/03/23 to remove the picc line and he does not want to wait that long.  Please advise.  Marco Kennedy

## 2022-12-21 NOTE — Telephone Encounter (Signed)
Covering for Dr. Renold Don - reviewed patients chart and Yes, I would be comfortable with early removal of PICC line and conversion to oral therapy.   He will need to start Cefadroxil 2 tabs twice a day once the IV antibiotics stop. I sent in a rx to his local pharmacy in [place].   Will have him continue this until his FU with D.r Vu on 01/14/2023.   Thank you  Rexene Alberts, MSN, NP-C [facility]  [email or web address] Pager: [phone] Office: [phone] [facility] Main Line: [phone] *Secure Chat Communication Welcome

## 2023-01-03 ENCOUNTER — Encounter: Payer: Medicare Other | Admitting: Physical Medicine and Rehabilitation

## 2023-01-14 ENCOUNTER — Other Ambulatory Visit: Payer: Self-pay

## 2023-01-14 ENCOUNTER — Ambulatory Visit (INDEPENDENT_AMBULATORY_CARE_PROVIDER_SITE_OTHER): Payer: Medicare Other | Admitting: Internal Medicine

## 2023-01-14 VITALS — BP 125/84 | HR 91 | Temp 97.7°F | Resp 16

## 2023-01-14 DIAGNOSIS — M4321 Fusion of spine, occipito-atlanto-axial region: Secondary | ICD-10-CM | POA: Diagnosis not present

## 2023-01-14 DIAGNOSIS — L0291 Cutaneous abscess, unspecified: Secondary | ICD-10-CM

## 2023-01-14 DIAGNOSIS — M4620 Osteomyelitis of vertebra, site unspecified: Secondary | ICD-10-CM

## 2023-01-14 LAB — CBC
MCHC: 32.8 g/dL (ref 32.0–36.0)
MCV: 88.2 fL (ref 80.0–100.0)
RBC: 4.59 10*6/uL (ref 4.20–5.80)

## 2023-01-14 NOTE — Progress Notes (Signed)
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, complicated by late (12/2020) T7-T11 epidural abscess/vertebral OM requiring I&D 01/13/2021 (cx ecoli; blood cx negative), repeated admissions for complications including bilateral empyema/retroperitoneal abscess s/p drainage (fluid cx negative) presumed "spill over" from vertebral process (blood cx negative; tte 01/21/21 no evidence valve vegetation), right knee effusion with negative fluid cx 01/28/2021, another I&D 03/14/2021 & 03/31/2021 and more hardware stabilization for worsening epidural abscess at T9-11 level (operative cx and bcx negative), who done well with 20 months of antibiotics suppression, but unfortunately relapsed after 10 days of trial off abx starting 11/04/2021 (fever, e coli bacteremia, 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 He has been walking with front wheel walker since recovered from this process  Culture: 11/19/22 OSH blood cx ecoli (S cefazolin, cipro, bactrim)  11/20/22 blood cx negative 03/14/2021, 03/31/2021 surgical (spinal) fluid swab cx negative 03/14/2021 bcx negative 01/28/21 right knee fluid cx negative 01/26/21 bcx negative 01/20/21, 01/23/21 bcx negative 01/20/21 right and left pleural fluid cx negative 01/13/21 thoracolumbar operative fluid cx ecoli (S cefazolin, cipro, bactrim) 01/10/21 bcx negative   He had sepsis and worsening back pain about 10 days after stopping cefadroxil. Seen initially in [place], [place] ER and blood cx there was positive for ecoli  Of note, the CLSI had updated cefazolin mic to be </=2 but the cards from both [place] and [place], [place] has MIC</=4 (can't go below that) and sensitivity is presumed for cefazolin  5/26 mri of the entire spine does show new  paravertebral 2.7 fluid collection and recurrent t9-11 discitis/om. His repeat 11/20/22 blood cx here so far is negative  Neurosurgery had evaluated and given no new lower extremities neurological deficit or hardware failure, no plan to do any surgery at this time.   For now, agree given relative lack of concerning symptomatology and presence of small fluid collection, would do antibiotics and monitor   As he had failed a trial off abx rather quickly, will commit him to life-long suppression antibiotics treatment   -------------- 12/09/22 id clinic assessment Patient improved pain since back on antibiotics, since hospital discharge Mid back pain worse in morning waking up and getting back to sleep at night, but manageable during day. Tylenol once in a while.  Opat labs pending not in lab-corp website    6 weeks cefazolin until 01/01/23 (or before that weekend)  On the week when he stopped cefazolin, will restart 1000 mg cefadroxil bid    See me mid-end July for follow up    01/14/23 id clinic assessment Patient finished 6 weeks cefazolin on 01/01/23 and transitioned back to cefadroxil 1 gram twice a day Since the infection recurs, his back pain seems better but not yet back to baseline as when he was on prolonged duration of antibiotics before we stopped No fever, chill, n/v/diarrhea Opat labs crp had significantly improved but still elevated  Opat labs crp/esr 6/25   2  (0.3) /  56 6/18   26 (3) 6/11   48 (3)   Continue cefadroxil 1000mg  bid. In 3-6 months if doing well will decrease dose Ct thoracic spine with contrast f/u 3-4 weeks         ------------------------ HPI: 72 yo male well known to me, hx t10 to  sacral spinal fusion 03/2020, complicated by late (12/2020) T7-T11 epidural abscess/vertebral OM requiring I&D 01/13/2021 (cx ecoli; blood cx negative), repeated admissions for complications including bilateral empyema/retroperitoneal abscess s/p drainage (fluid cx  negative) presumed "spill over" from vertebral process (blood cx negative; tte 01/21/21 no evidence valve vegetation), right knee effusion with negative fluid cx 01/28/2021, another I&D 03/14/2021 & 03/31/2021 and more hardware stabilization for worsening epidural abscess at T9-11 level (operative cx and bcx negative), who done well with 20 months of antibiotics suppression, but unfortunately relapsed after 10 days of trial off abx starting 11/04/2021 (fever, e coli bacteremia, 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 He has been walking with front wheel walker since recovered from this process. He is here today for hospital follow up   Culture: 11/19/22 OSH blood cx ecoli (S cefazolin, cipro, bactrim)  11/20/22 blood cx negative 03/14/2021, 03/31/2021 surgical (spinal) fluid swab cx negative 03/14/2021 bcx negative 01/28/21 right knee fluid cx negative 01/26/21 bcx negative 01/20/21, 01/23/21 bcx negative 01/20/21 right and left pleural fluid cx negative 01/13/21 thoracolumbar operative fluid cx ecoli (S cefazolin, cipro, bactrim) 01/10/21 bcx negative   He had sepsis and worsening back pain about 10 days after stopping cefadroxil. Seen initially in [place], [place] ER and blood cx there was positive for ecoli  Of note, the CLSI had updated cefazolin mic to be </=2 but the cards from both [place] and [place], [place] has MIC</=4 (can't go below that) and sensitivity is presumed for cefazolin  5/26 mri of the entire spine does show new paravertebral 2.7 fluid collection and recurrent t9-11 discitis/om. His repeat 11/20/22 blood cx here so far is negative  Neurosurgery had evaluated and given no new lower extremities neurological deficit or hardware failure, no plan to do any surgery at this time.   For now, agree given relative lack of concerning symptomatology and presence of small fluid collection, would do  antibiotics and monitor   As he had failed a trial off abx rather quickly, will commit him to life-long suppression antibiotics treatment   --------------- 01/14/23 id clinic f/u See a/p above for detail of today's visit     Review of Systems: ROS All other ROS was negative, except mentioned above     OBJECTIVE: Vitals:   01/14/23 0931  BP: 125/84  Pulse: 91  Resp: 16  Temp: 97.7 F (36.5 C)  TempSrc: Oral  SpO2: 95%   There is no height or weight on file to calculate BMI.  Physical Exam General/constitutional: no distress, pleasant, walks with fww HEENT: Normocephalic, PER, Conj Clear, EOMI, Oropharynx clear Neck supple CV: rrr no mrg Lungs: clear to auscultation, normal respiratory effort Abd: Soft, Nontender Ext: no edema Skin: No Rash Neuro: nonfocal MSK: right lower thoracic mild tenderness; no fluctuance/warmth; no midline tenderness    Lab Results Lab Results  Component Value Date   WBC 16.5 (H) 11/23/2022   HGB 12.8 (L) 11/23/2022   HCT 37.5 (L) 11/23/2022   MCV 87.8 11/23/2022   PLT 230 11/23/2022    Lab Results  Component Value Date   CREATININE 0.83 11/23/2022   BUN 14 11/23/2022   NA 131 (L) 11/23/2022   K 3.6 11/23/2022   CL 95 (L) 11/23/2022   CO2 21 (L) 11/23/2022    Lab Results  Component Value Date   ALT 28 11/20/2022   AST 30 11/20/2022   ALKPHOS 67 11/20/2022   BILITOT 0.6 11/20/2022  Microbiology: No results found for this or any previous visit (from the past 240 hour(s)).    Serology:   Imaging: If present, new imagings (plain films, ct scans, and mri) have been personally visualized and interpreted; radiology reports have been reviewed. Decision making incorporated into the Impression / Recommendations.   11/20/22 cxr FINDINGS: Stable cardiomediastinal contours. Low lung volumes. No signs of pleural effusion or edema. No airspace opacities identified. Spinal fixation hardware noted within the mid and  lower thoracic spine. No acute osseous findings.   IMPRESSION: Low lung volumes. No acute findings.   11/21/22 mri c, t, and lumbar spine IMPRESSION: CERVICAL SPINE:   1. Fluid and enhancement within the C3-C4 disc space with normal adjacent endplates and no paravertebral inflammatory change, favored to be degenerative in etiology. 2. 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 3. Multilevel cervical spondylosis as described above. Moderate or severe bilateral neuroforaminal stenosis from C2-C3 through C6-C7. Moderate spinal canal stenosis at C4-C5.   THORACIC SPINE:   1. Recurrent T10-T11 osteomyelitis discitis with paravertebral inflammatory change and bilateral paravertebral abscesses measuring 2.7 x 2.1 cm on the left and 2.7 x 1.2 cm on the right. Evaluation for epidural involvement is limited due to hardware artifact obscuring the lower thoracic spinal canal. 2. Similar chronic sequela of osteomyelitis-discitis at T9-T10 without recurrence. 3. New adjacent segment disease at T5-T6 with large left subarticular disc extrusion, worsening left-sided facet arthropathy, and new moderate left neuroforaminal stenosis. 4. Recurrent small bilateral pleural effusions.   LUMBAR SPINE:   1. No evidence of osteomyelitis-discitis. 2. Postsurgical changes from prior thoracolumbosacral fusion.      03/2022 thoracic spine mri 1. Interval superior extension of the thoracolumbar fusion to include T6 through the sacrum. 2. The previously demonstrated changes of diskitis and osteomyelitis at T9-10 and T10-11 have substantially improved, and there is no evidence of residual or recurrent epidural or paraspinal fluid collection. There are some residual marrow changes within the T10 vertebral body, but no progressive bone destruction. 3. No new osseous abnormalities. 4. Possible subpleural right lower lobe pulmonary nodule.  Further evaluation with chest CT recommended.     Raymondo Band, MD [facility] [phone] pager    01/14/2023, 9:37 AM

## 2023-01-14 NOTE — Patient Instructions (Signed)
Stretch/heat pad for back pain  If progessive persistent or fever/chill let me know   Ct scan back in 2 weeks  See me in 3 weeks   Labs today

## 2023-01-15 LAB — CBC
HCT: 40.5 % (ref 38.5–50.0)
Hemoglobin: 13.3 g/dL (ref 13.2–17.1)
MCH: 29 pg (ref 27.0–33.0)
MPV: 9.5 fL (ref 7.5–12.5)
Platelets: 417 10*3/uL — ABNORMAL HIGH (ref 140–400)
RDW: 12.7 % (ref 11.0–15.0)
WBC: 10.6 10*3/uL (ref 3.8–10.8)

## 2023-01-15 LAB — COMPLETE METABOLIC PANEL WITH GFR
AG Ratio: 1.3 (calc) (ref 1.0–2.5)
ALT: 13 U/L (ref 9–46)
AST: 17 U/L (ref 10–35)
Albumin: 4 g/dL (ref 3.6–5.1)
Alkaline phosphatase (APISO): 70 U/L (ref 35–144)
BUN: 22 mg/dL (ref 7–25)
CO2: 25 mmol/L (ref 20–32)
Calcium: 9.3 mg/dL (ref 8.6–10.3)
Chloride: 103 mmol/L (ref 98–110)
Creat: 0.89 mg/dL (ref 0.70–1.28)
Globulin: 3 g/dL (calc) (ref 1.9–3.7)
Glucose, Bld: 160 mg/dL — ABNORMAL HIGH (ref 65–99)
Potassium: 4.7 mmol/L (ref 3.5–5.3)
Sodium: 139 mmol/L (ref 135–146)
Total Bilirubin: 0.4 mg/dL (ref 0.2–1.2)
Total Protein: 7 g/dL (ref 6.1–8.1)
eGFR: 91 mL/min/{1.73_m2} (ref >=60)

## 2023-01-15 LAB — C-REACTIVE PROTEIN: CRP: 13.5 mg/L — ABNORMAL HIGH (ref <8.0)

## 2023-01-17 ENCOUNTER — Encounter
Payer: Medicare Other | Attending: Physical Medicine and Rehabilitation | Admitting: Physical Medicine and Rehabilitation

## 2023-01-17 ENCOUNTER — Encounter: Payer: Self-pay | Admitting: Physical Medicine and Rehabilitation

## 2023-01-17 VITALS — BP 130/79 | HR 103 | Resp 97 | Ht 68.0 in | Wt 288.5 lb

## 2023-01-17 DIAGNOSIS — G8929 Other chronic pain: Secondary | ICD-10-CM | POA: Diagnosis present

## 2023-01-17 DIAGNOSIS — M7918 Myalgia, other site: Secondary | ICD-10-CM | POA: Diagnosis present

## 2023-01-17 DIAGNOSIS — G8222 Paraplegia, incomplete: Secondary | ICD-10-CM | POA: Diagnosis present

## 2023-01-17 DIAGNOSIS — M5442 Lumbago with sciatica, left side: Secondary | ICD-10-CM | POA: Diagnosis present

## 2023-01-17 DIAGNOSIS — M4620 Osteomyelitis of vertebra, site unspecified: Secondary | ICD-10-CM | POA: Insufficient documentation

## 2023-01-17 DIAGNOSIS — R252 Cramp and spasm: Secondary | ICD-10-CM | POA: Diagnosis not present

## 2023-01-17 MED ORDER — DULOXETINE HCL 60 MG PO CPEP: 60.0000 mg | ORAL_CAPSULE | Freq: Every day | ORAL | 1 refills | Status: DC

## 2023-01-17 MED ORDER — METHOCARBAMOL 500 MG PO TABS: 500.0000 mg | ORAL_TABLET | Freq: Four times a day (QID) | ORAL | 1 refills | Status: AC | PRN

## 2023-01-17 NOTE — Progress Notes (Signed)
Subjective:    [name], male    DOB: 15-Jan-1951, 72 y.o.   MRN: [id number]  HPI  Pt is a 72 yr old male with hx of epidural abscess with incomplete paraplegia status post T9-T10 laminectomy decompression followed by reconstruction T6-T10 posterior decompression fusion removal of hardware and extension of fusion to the thoracic 11 decompression of spinal cord direct T9-T11 laminectomy arthrodesis T6-T11 03/31/2021; also has post op and chronic pain; muscle spasms; and newly dx'd HTN; as well as OSA, gout; and IBS, with obesity; New dx of prediabetes? Vs DM.    F/u on paraplegia.   Dr Renold Don took him off PO ABX- within 2 weeks, back in hospital- CRP 36 or so and back down to 13.5 (normal is 5 or so for him)  Scheduled for CT with contrast on 7/31 to f/u on fluid collections.    Has another f/u with Dr Renold Don 8/20- to f/u on infection.   Didn't really lose strength- but lost some endurance-  And having pain in mid back to upper lumbar back- mainly tightness-  Up to 3-4/10- because of walk- and table.   The last PT told him he has muscle spasms/knots.   Has had intermittent nausea- intermittent dry heaves.   Worst pain when gets Up/sits up- when first sits up- goes 4-5/10- first in AM.   But calms down after 1-2 hours.   Usually walks with RW- but uses cane mainly in house.   Run out of PT for this year.      Pain Inventory Average Pain 2 Pain Right Now 2 My pain is sharp and aching  In the last 24 hours, has pain interfered with the following? General activity 2 Relation with others 1 Enjoyment of life 1 What TIME of day is your pain at its worst? morning  Sleep (in general) Good  Pain is worse with:  other Pain improves with:  other Relief from Meds: 2  Family History  Problem Relation Age of Onset   Hypertension Mother    Arthritis Mother    Stomach cancer Father    Hypertension Brother    Social History   Socioeconomic History   Marital  status: Married    Spouse name: Not on file   Number of children: 2   Years of education: Not on file   Highest education level: Not on file  Occupational History   Not on file  Tobacco Use   Smoking status: Never   Smokeless tobacco: Never  Vaping Use   Vaping status: Never Used  Substance and Sexual Activity   Alcohol use: Not Currently    Comment: rare- social    Drug use: No   Sexual activity: Yes  Other Topics Concern   Not on file  Social History Narrative   Not on file   Social Determinants of Health   Financial Resource Strain: Not on file  Food Insecurity: No Food Insecurity (11/20/2022)   Hunger Vital Sign    Worried About Running Out of Food in the Last Year: Never true    Ran Out of Food in the Last Year: Never true  Transportation Needs: No Transportation Needs (11/20/2022)   PRAPARE - Administrator, Civil Service (Medical): No    Lack of Transportation (Non-Medical): No  Physical Activity: Not on file  Stress: Not on file  Social Connections: Not on file   Past Surgical History:  Procedure Laterality Date   ABDOMINAL EXPOSURE  N/A 04/18/2020   Procedure: ABDOMINAL EXPOSURE;  Surgeon: [name], MD;  Location: [facility] OR;  Service: Vascular;  Laterality: N/A;   ANTERIOR LAT LUMBAR FUSION Left 04/18/2020   Procedure: ANTERIOR LATERAL LUMBAR FUSION LUMBAR TWO-THREE, LUMBAR THREE-FOUR;  Surgeon: [name], MD;  Location: [facility] OR;  Service: Neurosurgery;  Laterality: Left;   ANTERIOR LUMBAR FUSION N/A 04/18/2020   Procedure: Lumbar Four-Five Lumbar Five- Sacral One Anterior lumbar Interbody Fusion;  Surgeon: [name], MD;  Location: [facility] OR;  Service: Neurosurgery;  Laterality: N/A;   APPLICATION OF INTRAOPERATIVE CT SCAN N/A 04/21/2020   Procedure: APPLICATION OF INTRAOPERATIVE CT SCAN;  Surgeon: [name], MD;  Location: [facility] OR;  Service: Neurosurgery;  Laterality: N/A;   BACK SURGERY     lumbar disc surgery   COLONOSCOPY W/  POLYPECTOMY     HERNIA REPAIR Left    left inguinal hernia repair   IR RADIOLOGIST EVAL & MGMT  02/24/2021   KNEE ARTHROSCOPY Right 2003    -scope for meniscus   LUMBAR WOUND DEBRIDEMENT N/A 01/13/2021   Procedure: THORACOLUMBAR WOUND DEBRIDEMENT;  Surgeon: [name], MD;  Location: [facility] OR;  Service: Neurosurgery;  Laterality: N/A;   LUMBAR WOUND DEBRIDEMENT N/A 03/14/2021   Procedure: WOUND EXPLORATION AND DEBRIDEMENT OF ABSCESS;  Surgeon: Lisbeth Renshaw, MD;  Location: [facility] OR;  Service: Neurosurgery;  Laterality: N/A;   POSTERIOR LUMBAR FUSION 4 LEVEL N/A 04/21/2020   Procedure: Thoracic ten to pelvis pedicle screw fixation with osteotomies;  Surgeon: [name], MD;  Location: [facility] OR;  Service: Neurosurgery;  Laterality: N/A;   POSTERIOR LUMBAR FUSION 4 LEVEL N/A 03/31/2021   Procedure: Thoracic six to Thoracic nine  Posterior decompression/Fusion with removal hardware thoracic ten;  Surgeon: [name], MD;  Location: [facility] OR;  Service: Neurosurgery;  Laterality: N/A;   THORACIC LAMINECTOMY FOR EPIDURAL ABSCESS N/A 03/14/2021   Procedure: THORACIC LAMINECTOMY, THORACIC NINE-THORACIC TEN;  Surgeon: Lisbeth Renshaw, MD;  Location: [facility] OR;  Service: Neurosurgery;  Laterality: N/A;   TOTAL HIP ARTHROPLASTY Left 06/15/2016   Procedure: LEFT TOTAL HIP ARTHROPLASTY ANTERIOR APPROACH;  Surgeon: [name], MD;  Location: [facility] ORS;  Service: Orthopedics;  Laterality: Left;   Past Surgical History:  Procedure Laterality Date   ABDOMINAL EXPOSURE N/A 04/18/2020   Procedure: ABDOMINAL EXPOSURE;  Surgeon: [name], MD;  Location: [facility] OR;  Service: Vascular;  Laterality: N/A;   ANTERIOR LAT LUMBAR FUSION Left 04/18/2020   Procedure: ANTERIOR LATERAL LUMBAR FUSION LUMBAR TWO-THREE, LUMBAR THREE-FOUR;  Surgeon: [name], MD;  Location: [facility] OR;  Service: Neurosurgery;  Laterality: Left;   ANTERIOR LUMBAR FUSION N/A 04/18/2020   Procedure: Lumbar Four-Five Lumbar Five- Sacral One  Anterior lumbar Interbody Fusion;  Surgeon: [name], MD;  Location: [facility] OR;  Service: Neurosurgery;  Laterality: N/A;   APPLICATION OF INTRAOPERATIVE CT SCAN N/A 04/21/2020   Procedure: APPLICATION OF INTRAOPERATIVE CT SCAN;  Surgeon: [name], MD;  Location: [facility] OR;  Service: Neurosurgery;  Laterality: N/A;   BACK SURGERY     lumbar disc surgery   COLONOSCOPY W/ POLYPECTOMY     HERNIA REPAIR Left    left inguinal hernia repair   IR RADIOLOGIST EVAL & MGMT  02/24/2021   KNEE ARTHROSCOPY Right 2003    -scope for meniscus   LUMBAR WOUND DEBRIDEMENT N/A 01/13/2021   Procedure: THORACOLUMBAR WOUND DEBRIDEMENT;  Surgeon: [name], MD;  Location: [facility] OR;  Service: Neurosurgery;  Laterality: N/A;   LUMBAR WOUND DEBRIDEMENT N/A 03/14/2021  Procedure: WOUND EXPLORATION AND DEBRIDEMENT OF ABSCESS;  Surgeon: Lisbeth Renshaw, MD;  Location: [facility] OR;  Service: Neurosurgery;  Laterality: N/A;   POSTERIOR LUMBAR FUSION 4 LEVEL N/A 04/21/2020   Procedure: Thoracic ten to pelvis pedicle screw fixation with osteotomies;  Surgeon: [name], MD;  Location: [facility] OR;  Service: Neurosurgery;  Laterality: N/A;   POSTERIOR LUMBAR FUSION 4 LEVEL N/A 03/31/2021   Procedure: Thoracic six to Thoracic nine  Posterior decompression/Fusion with removal hardware thoracic ten;  Surgeon: [name], MD;  Location: [facility] OR;  Service: Neurosurgery;  Laterality: N/A;   THORACIC LAMINECTOMY FOR EPIDURAL ABSCESS N/A 03/14/2021   Procedure: THORACIC LAMINECTOMY, THORACIC NINE-THORACIC TEN;  Surgeon: Lisbeth Renshaw, MD;  Location: [facility] OR;  Service: Neurosurgery;  Laterality: N/A;   TOTAL HIP ARTHROPLASTY Left 06/15/2016   Procedure: LEFT TOTAL HIP ARTHROPLASTY ANTERIOR APPROACH;  Surgeon: [name], MD;  Location: [facility] ORS;  Service: Orthopedics;  Laterality: Left;   Past Medical History:  Diagnosis Date   Arthritis    arthritis-bilateral knees, left Hip.   Degenerative lumbar spinal stenosis    EKG abnormality     06-08-16- being evaluated by cardiologist  in [place]   Idiopathic scoliosis of lumbar region    Joint stiffness    Knee pain    Leg swelling    Low back pain    Achy, shooting pain.  Hurts to Walk or stand up straight.   Lumbar radiculopathy    Scoliosis concern    Sleep apnea    Bipap use nightly 25/19 -3l/m oxygen   Spondylolisthesis of lumbar region    Transfusion history    [place] ,[place] after bleeding post colon polyp removal and colonscopy procedure   BP 130/79   Pulse (!) 103   Resp (!) 97   Ht 5\' 8"  (1.727 m)   Wt 288 lb 8 oz (130.9 kg)   BMI 43.87 kg/m   Opioid Risk Score:   Fall Risk Score:  `1  Depression screen [facility] 2/9     01/17/2023   10:40 AM 01/14/2023    9:34 AM 11/05/2022   10:43 AM 07/05/2022   10:19 AM 05/04/2022   10:16 AM 12/04/2021   11:03 AM 10/27/2021   10:23 AM  Depression screen [facility] 2/9  Decreased Interest 0 0 0 0 0 0 0  Down, Depressed, Hopeless 0 0 0 0 0 0 0  PHQ - 2 Score 0 0 0 0 0 0 0     Review of Systems  Constitutional: Negative.   HENT: Negative.    Eyes: Negative.   Respiratory: Negative.    Cardiovascular: Negative.   Gastrointestinal: Negative.   Musculoskeletal:  Positive for back pain.       Knee pain bilateral  Skin: Negative.   Allergic/Immunologic: Negative.   Neurological: Negative.   Hematological: Negative.   Psychiatric/Behavioral: Negative.    All other systems reviewed and are negative.      Objective:   Physical Exam Awake, alert, appropriate, using RW, NAD Jovial at times Accompanied by wife  MS: RLE- HF 5-/5; KE 5-/5; DF 4-/5 and PF 5-/5 LLE- HF 4-/5; KE 4/5; DF 3-/5 and PF 4+/5  Palpable trigger points ~ T8-T12 paraspinals B/L - significant TTP      Assessment & Plan:   Pt is a 72 yr old male with hx of epidural abscess with incomplete paraplegia status post T9-T10 laminectomy decompression followed by reconstruction T6-T10 posterior decompression fusion removal of hardware and extension of  fusion  to the thoracic 11 decompression of spinal cord direct T9-T11 laminectomy arthrodesis T6-T11 03/31/2021; also has post op and chronic pain; muscle spasms; and newly dx'd HTN; as well as OSA, gout; and IBS, with obesity; New dx of prediabetes? Vs DM.    F/u on paraplegia.    Robaxin was helpful in past- so will do another Rx for him-   - Robaxin- 500 mg up to 4x/day as needed  - can take 1.5 to 2 tabs max 4 times/day- but start with 1 tab up to 4x/day. Let me know if needs higher dose.   2. Doesn't want opiates at this time- can call if  Wants something- can start Tramadol or Hydrocodone when runs out of Oxycodone.     3. Con't Duloxetine 60 mg daily. Sent in 3 months supply- 1 refill.   4. Do Home exercise program that you've been given- and at next f/u we can think about if needs outpt PT at that time.   5. Tennis balls- hold pressure  (do not massage) at least 4 minutes on each trigger point/knots- esp helpful for car usage- do against a hard surface- dining chair- wall; door; hard surface- at least 3 days/week- max 1 hour/day. Hold firm pressure!   6. F/U in 3 months- can not do trigger point injections yet, but could at f/u.    I spent a total of  31   minutes on total care today- >50% coordination of care- due to  D/w pt about myofascial pain/treatment- cannot do Trigger points injections- can do next time- also d/w pt about meds and options of how to treat back pain from recurrent osteomyelitis.

## 2023-01-17 NOTE — Patient Instructions (Signed)
Pt is a 72 yr old male with hx of epidural abscess with incomplete paraplegia status post T9-T10 laminectomy decompression followed by reconstruction T6-T10 posterior decompression fusion removal of hardware and extension of fusion to the thoracic 11 decompression of spinal cord direct T9-T11 laminectomy arthrodesis T6-T11 03/31/2021; also has post op and chronic pain; muscle spasms; and newly dx'd HTN; as well as OSA, gout; and IBS, with obesity; New dx of prediabetes? Vs DM.    F/u on paraplegia.    Robaxin was helpful in past- so will do another Rx for him-   - Robaxin- 500 mg up to 4x/day as needed  - can take 1.5 to 2 tabs max 4 times/day- but start with 1 tab up to 4x/day. Let me know if needs higher dose.   2. Doesn't want opiates at this time- can call if  Wants something- can start Tramadol or Hydrocodone when runs out of Oxycodone.     3. Con't Duloxetine 60 mg daily. Sent in 3 months supply- 1 refill.   4. Do Home exercise program that you've been given- and at next f/u we can think about if needs outpt PT at that time.   5. Tennis balls- hold pressure  (do not massage) at least 4 minutes on each trigger point/knots- esp helpful for car usage- do against a hard surface- dining chair- wall; door; hard surface- at least 3 days/week- max 1 hour/day. Hold firm pressure!   6. F/U in 3 months- can not do trigger point injections yet, but could at f/u.

## 2023-01-26 ENCOUNTER — Ambulatory Visit (HOSPITAL_BASED_OUTPATIENT_CLINIC_OR_DEPARTMENT_OTHER): Payer: Medicare Other

## 2023-02-08 ENCOUNTER — Ambulatory Visit (HOSPITAL_BASED_OUTPATIENT_CLINIC_OR_DEPARTMENT_OTHER): Payer: Medicare Other

## 2023-02-15 ENCOUNTER — Ambulatory Visit: Payer: Medicare Other | Admitting: Internal Medicine

## 2023-02-21 ENCOUNTER — Ambulatory Visit (HOSPITAL_BASED_OUTPATIENT_CLINIC_OR_DEPARTMENT_OTHER)
Admission: RE | Admit: 2023-02-21 | Discharge: 2023-02-21 | Disposition: A | Discharge status: Home / Self Care | Payer: Medicare Other | Source: Ambulatory Visit | Attending: Internal Medicine | Admitting: Internal Medicine

## 2023-02-21 DIAGNOSIS — L0291 Cutaneous abscess, unspecified: Secondary | ICD-10-CM | POA: Diagnosis present

## 2023-02-21 DIAGNOSIS — M4620 Osteomyelitis of vertebra, site unspecified: Secondary | ICD-10-CM | POA: Insufficient documentation

## 2023-02-21 MED ORDER — IOHEXOL 300 MG/ML  SOLN
100.0000 mL | Freq: Once | INTRAMUSCULAR | Status: AC | PRN
  Administered 2023-02-21: 100 mL via INTRAVENOUS

## 2023-02-23 ENCOUNTER — Telehealth: Payer: Self-pay | Admitting: *Deleted

## 2023-02-23 NOTE — Telephone Encounter (Signed)
Annice Pih called from [employer] and states the notes they received do not suffice for the approval of the heavy duty wheelchair (10/2022 note) and we reviewed the July visit and no mention of wheelchair need, so in order for it to be approved Dr Berline Chough will need to document in her notes @(04/18/23) visit about the need for heavy duty wheelchair. They will need an updated Rx but they will supply that for him. I have made a note in the appointment note to remind of the need for documentation.

## 2023-02-25 NOTE — Telephone Encounter (Signed)
Marco Kennedy- if need be, could do 20 minutes appt at end of day- but it's the only thing I can address- literally nothing else- thanks- ML

## 2023-03-03 ENCOUNTER — Encounter: Payer: Self-pay | Admitting: Internal Medicine

## 2023-03-03 ENCOUNTER — Ambulatory Visit (INDEPENDENT_AMBULATORY_CARE_PROVIDER_SITE_OTHER): Payer: Medicare Other | Admitting: Internal Medicine

## 2023-03-03 ENCOUNTER — Other Ambulatory Visit: Payer: Self-pay

## 2023-03-03 VITALS — BP 136/84 | HR 98 | Temp 97.6°F | Ht 68.0 in | Wt 284.0 lb

## 2023-03-03 DIAGNOSIS — M4620 Osteomyelitis of vertebra, site unspecified: Secondary | ICD-10-CM

## 2023-03-03 DIAGNOSIS — T8463XD Infection and inflammatory reaction due to internal fixation device of spine, subsequent encounter: Secondary | ICD-10-CM | POA: Diagnosis not present

## 2023-03-03 DIAGNOSIS — T847XXD Infection and inflammatory reaction due to other internal orthopedic prosthetic devices, implants and grafts, subsequent encounter: Secondary | ICD-10-CM

## 2023-03-03 NOTE — Patient Instructions (Signed)
We'll check with radiology to put in read for ct scan  Labs today   F/u 3 months or sooner if concern about back

## 2023-03-03 NOTE — Progress Notes (Signed)
[facility]    Abx: 5/25-01/01/23 cefazolin  Cefadroxil up until 11/05/22; 01/01/23-c  ASSESSMENT: 72 yo male well known to me, hx t10 to sacral spinal fusion 03/2020, complicated by late (12/2020) T7-T11 epidural abscess/vertebral OM requiring I&D 01/13/2021 (cx ecoli; blood cx negative), repeated admissions for complications including bilateral empyema/retroperitoneal abscess s/p drainage (fluid cx negative) presumed "spill over" from vertebral process (blood cx negative; tte 01/21/21 no evidence valve vegetation), right knee effusion with negative fluid cx 01/28/2021, another I&D 03/14/2021 & 03/31/2021 and more hardware stabilization for worsening epidural abscess at T9-11 level (operative cx and bcx negative), who done well with 20 months of antibiotics suppression, but unfortunately relapsed after 10 days of trial off abx starting 11/04/2021 (fever, e coli bacteremia, 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 He has been walking with front wheel walker since recovered from this process  Culture: 11/19/22 OSH blood cx ecoli (S cefazolin, cipro, bactrim)  11/20/22 blood cx negative 03/14/2021, 03/31/2021 surgical (spinal) fluid swab cx negative 03/14/2021 bcx negative 01/28/21 right knee fluid cx negative 01/26/21 bcx negative 01/20/21, 01/23/21 bcx negative 01/20/21 right and left pleural fluid cx negative 01/13/21 thoracolumbar operative fluid cx ecoli (S cefazolin, cipro, bactrim) 01/10/21 bcx negative   He had sepsis and worsening back pain about 10 days after stopping cefadroxil. Seen initially in [place], [place] ER and blood cx there was positive for ecoli  Of note, the CLSI had updated cefazolin mic to be </=2 but the cards from both [place] and [place], [place] has MIC</=4 (can't go below that) and sensitivity is presumed for cefazolin  5/26 mri of the entire  spine does show new paravertebral 2.7 fluid collection and recurrent t9-11 discitis/om. His repeat 11/20/22 blood cx here so far is negative  Neurosurgery had evaluated and given no new lower extremities neurological deficit or hardware failure, no plan to do any surgery at this time.   For now, agree given relative lack of concerning symptomatology and presence of small fluid collection, would do antibiotics and monitor   As he had failed a trial off abx rather quickly, will commit him to life-long suppression antibiotics treatment   -------------- 12/09/22 id clinic assessment Patient improved pain since back on antibiotics, since hospital discharge Mid back pain worse in morning waking up and getting back to sleep at night, but manageable during day. Tylenol once in a while.  Opat labs pending not in lab-corp website    6 weeks cefazolin until 01/01/23 (or before that weekend)  On the week when he stopped cefazolin, will restart 1000 mg cefadroxil bid    See me mid-end July for follow up    01/14/23 id clinic assessment Patient finished 6 weeks cefazolin on 01/01/23 and transitioned back to cefadroxil 1 gram twice a day Since the infection recurs, his back pain seems better but not yet back to baseline as when he was on prolonged duration of antibiotics before we stopped No fever, chill, n/v/diarrhea Opat labs crp had significantly improved but still elevated  Opat labs crp/esr 7/19  13.5 (<8) 6/25   2  (0.3) /  56 6/18   26 (3) 6/11   48 (3)   Continue cefadroxil 1000mg  bid. In 3-6 months if doing well will decrease dose Ct thoracic spine with contrast f/u 3-4 weeks   ---------- 03/03/23 id clinic assessment Doing better (mild chronic lower back pain) and  recurrent pain on the mid back right side since after stopping cefadroxil briefly now uch improved No n/v/diarrhea No fever, chill  8/26 ct done thoracic spine but no read yet. But suspect improvement in setting of sx  reported  Labs today  F/u 3 months  Continue cefadroxil             ------------------------ HPI: 72 yo male well known to me, hx t10 to sacral spinal fusion 03/2020, complicated by late (12/2020) T7-T11 epidural abscess/vertebral OM requiring I&D 01/13/2021 (cx ecoli; blood cx negative), repeated admissions for complications including bilateral empyema/retroperitoneal abscess s/p drainage (fluid cx negative) presumed "spill over" from vertebral process (blood cx negative; tte 01/21/21 no evidence valve vegetation), right knee effusion with negative fluid cx 01/28/2021, another I&D 03/14/2021 & 03/31/2021 and more hardware stabilization for worsening epidural abscess at T9-11 level (operative cx and bcx negative), who done well with 20 months of antibiotics suppression, but unfortunately relapsed after 10 days of trial off abx starting 11/04/2021 (fever, e coli bacteremia, 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 He has been walking with front wheel walker since recovered from this process. He is here today for hospital follow up   Culture: 11/19/22 OSH blood cx ecoli (S cefazolin, cipro, bactrim)  11/20/22 blood cx negative 03/14/2021, 03/31/2021 surgical (spinal) fluid swab cx negative 03/14/2021 bcx negative 01/28/21 right knee fluid cx negative 01/26/21 bcx negative 01/20/21, 01/23/21 bcx negative 01/20/21 right and left pleural fluid cx negative 01/13/21 thoracolumbar operative fluid cx ecoli (S cefazolin, cipro, bactrim) 01/10/21 bcx negative   He had sepsis and worsening back pain about 10 days after stopping cefadroxil. Seen initially in [place], [place] ER and blood cx there was positive for ecoli  Of note, the CLSI had updated cefazolin mic to be </=2 but the cards from both [place] and [place], [place] has MIC</=4 (can't go below that) and sensitivity is presumed for cefazolin  5/26 mri  of the entire spine does show new paravertebral 2.7 fluid collection and recurrent t9-11 discitis/om. His repeat 11/20/22 blood cx here so far is negative  Neurosurgery had evaluated and given no new lower extremities neurological deficit or hardware failure, no plan to do any surgery at this time.   For now, agree given relative lack of concerning symptomatology and presence of small fluid collection, would do antibiotics and monitor   As he had failed a trial off abx rather quickly, will commit him to life-long suppression antibiotics treatment   --------------- 03/03/23 id clinic f/u  See a/p above for detail of today's visit     Review of Systems: ROS All other ROS was negative, except mentioned above     OBJECTIVE: Vitals:   03/03/23 1019  BP: 136/84  Pulse: 98  Temp: 97.6 F (36.4 C)  TempSrc: Temporal  SpO2: 96%  Weight: 284 lb (128.8 kg)  Height: 5\' 8"  (1.727 m)   Body mass index is 43.18 kg/m.  Physical Exam General/constitutional: no distress, pleasant, walks with fww HEENT: Normocephalic, PER, Conj Clear, EOMI, Oropharynx clear Neck supple CV: rrr no mrg Lungs: clear to auscultation, normal respiratory effort Abd: Soft, Nontender Ext: no edema Skin: No Rash Neuro: nonfocal MSK: right lower thoracic mild tenderness; no fluctuance/warmth; no midline tenderness    Lab Results Lab Results  Component Value Date   WBC 10.6 01/14/2023   HGB 13.3 01/14/2023   HCT 40.5 01/14/2023   MCV 88.2 01/14/2023   PLT  417 (H) 01/14/2023    Lab Results  Component Value Date   CREATININE 0.89 01/14/2023   BUN 22 01/14/2023   NA 139 01/14/2023   K 4.7 01/14/2023   CL 103 01/14/2023   CO2 25 01/14/2023    Lab Results  Component Value Date   ALT 13 01/14/2023   AST 17 01/14/2023   ALKPHOS 67 11/20/2022   BILITOT 0.4 01/14/2023      Microbiology: No results found for this or any previous visit (from the past 240  hour(s)).    Serology:   Imaging: If present, new imagings (plain films, ct scans, and mri) have been personally visualized and interpreted; radiology reports have been reviewed. Decision making incorporated into the Impression / Recommendations.   11/20/22 cxr FINDINGS: Stable cardiomediastinal contours. Low lung volumes. No signs of pleural effusion or edema. No airspace opacities identified. Spinal fixation hardware noted within the mid and lower thoracic spine. No acute osseous findings.   IMPRESSION: Low lung volumes. No acute findings.   11/21/22 mri c, t, and lumbar spine IMPRESSION: CERVICAL SPINE:   1. Fluid and enhancement within the C3-C4 disc space with normal adjacent endplates and no paravertebral inflammatory change, favored to be degenerative in etiology. 2. 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 3. Multilevel cervical spondylosis as described above. Moderate or severe bilateral neuroforaminal stenosis from C2-C3 through C6-C7. Moderate spinal canal stenosis at C4-C5.   THORACIC SPINE:   1. Recurrent T10-T11 osteomyelitis discitis with paravertebral inflammatory change and bilateral paravertebral abscesses measuring 2.7 x 2.1 cm on the left and 2.7 x 1.2 cm on the right. Evaluation for epidural involvement is limited due to hardware artifact obscuring the lower thoracic spinal canal. 2. Similar chronic sequela of osteomyelitis-discitis at T9-T10 without recurrence. 3. New adjacent segment disease at T5-T6 with large left subarticular disc extrusion, worsening left-sided facet arthropathy, and new moderate left neuroforaminal stenosis. 4. Recurrent small bilateral pleural effusions.   LUMBAR SPINE:   1. No evidence of osteomyelitis-discitis. 2. Postsurgical changes from prior thoracolumbosacral fusion.      03/2022 thoracic spine mri 1. Interval superior extension of the thoracolumbar fusion  to include T6 through the sacrum. 2. The previously demonstrated changes of diskitis and osteomyelitis at T9-10 and T10-11 have substantially improved, and there is no evidence of residual or recurrent epidural or paraspinal fluid collection. There are some residual marrow changes within the T10 vertebral body, but no progressive bone destruction. 3. No new osseous abnormalities. 4. Possible subpleural right lower lobe pulmonary nodule. Further evaluation with chest CT recommended.     Raymondo Band, MD [facility] [phone] pager    03/03/2023, 10:38 AM

## 2023-03-04 LAB — CBC
HCT: 40.3 % (ref 38.5–50.0)
Hemoglobin: 13.3 g/dL (ref 13.2–17.1)
MCH: 28.2 pg (ref 27.0–33.0)
MCHC: 33 g/dL (ref 32.0–36.0)
MCV: 85.4 fL (ref 80.0–100.0)
MPV: 10.1 fL (ref 7.5–12.5)
Platelets: 412 10*3/uL — ABNORMAL HIGH (ref 140–400)
RBC: 4.72 10*6/uL (ref 4.20–5.80)
RDW: 13.3 % (ref 11.0–15.0)
WBC: 10 10*3/uL (ref 3.8–10.8)

## 2023-03-04 LAB — COMPLETE METABOLIC PANEL WITH GFR
AG Ratio: 1.4 (calc) (ref 1.0–2.5)
ALT: 18 U/L (ref 9–46)
AST: 19 U/L (ref 10–35)
Albumin: 4.2 g/dL (ref 3.6–5.1)
Alkaline phosphatase (APISO): 67 U/L (ref 35–144)
BUN/Creatinine Ratio: 30 (calc) — ABNORMAL HIGH (ref 6–22)
BUN: 27 mg/dL — ABNORMAL HIGH (ref 7–25)
CO2: 28 mmol/L (ref 20–32)
Calcium: 9.7 mg/dL (ref 8.6–10.3)
Chloride: 102 mmol/L (ref 98–110)
Creat: 0.89 mg/dL (ref 0.70–1.28)
Globulin: 3 g/dL (ref 1.9–3.7)
Glucose, Bld: 121 mg/dL — ABNORMAL HIGH (ref 65–99)
Potassium: 5.1 mmol/L (ref 3.5–5.3)
Sodium: 140 mmol/L (ref 135–146)
Total Bilirubin: 0.4 mg/dL (ref 0.2–1.2)
Total Protein: 7.2 g/dL (ref 6.1–8.1)
eGFR: 91 mL/min/{1.73_m2} (ref >=60)

## 2023-03-04 LAB — C-REACTIVE PROTEIN: CRP: 5.6 mg/L (ref <8.0)

## 2023-04-18 ENCOUNTER — Encounter: Payer: Self-pay | Admitting: Physical Medicine and Rehabilitation

## 2023-04-18 ENCOUNTER — Encounter
Payer: Medicare Other | Attending: Physical Medicine and Rehabilitation | Admitting: Physical Medicine and Rehabilitation

## 2023-04-18 VITALS — BP 124/84 | HR 96 | Ht 68.0 in | Wt 290.0 lb

## 2023-04-18 DIAGNOSIS — R269 Unspecified abnormalities of gait and mobility: Secondary | ICD-10-CM | POA: Insufficient documentation

## 2023-04-18 DIAGNOSIS — M4644 Discitis, unspecified, thoracic region: Secondary | ICD-10-CM | POA: Diagnosis present

## 2023-04-18 DIAGNOSIS — M4620 Osteomyelitis of vertebra, site unspecified: Secondary | ICD-10-CM | POA: Diagnosis present

## 2023-04-18 DIAGNOSIS — M21372 Foot drop, left foot: Secondary | ICD-10-CM | POA: Insufficient documentation

## 2023-04-18 DIAGNOSIS — M7918 Myalgia, other site: Secondary | ICD-10-CM | POA: Insufficient documentation

## 2023-04-18 DIAGNOSIS — M21371 Foot drop, right foot: Secondary | ICD-10-CM | POA: Insufficient documentation

## 2023-04-18 MED ORDER — SILDENAFIL CITRATE 100 MG PO TABS: 100.0000 mg | ORAL_TABLET | Freq: Every day | ORAL | 5 refills | Status: DC | PRN

## 2023-04-18 MED ORDER — LIDOCAINE HCL 1 % IJ SOLN
3.0000 mL | Freq: Once | INTRAMUSCULAR | Status: AC
Start: 2023-04-18 — End: 2023-04-18
  Administered 2023-04-18: 3 mL

## 2023-04-18 MED ORDER — DULOXETINE HCL 30 MG PO CPEP
30.0000 mg | ORAL_CAPSULE | Freq: Every day | ORAL | 1 refills | Status: DC
Start: 2023-04-18 — End: 2023-07-18

## 2023-04-18 NOTE — Progress Notes (Signed)
Subjective:    [name], male    DOB: [birth year], 72 y.o.   MRN: [id number]  HPI  Pt is a 72 yr old male with hx of epidural abscess with incomplete paraplegia status post T9-T10 laminectomy decompression followed by reconstruction T6-T10 posterior decompression fusion removal of hardware and extension of fusion to the thoracic 11 decompression of spinal cord direct T9-T11 laminectomy arthrodesis T6-T11 03/31/2021; also has post op and chronic pain; muscle spasms; and newly dx'd HTN; as well as OSA, gout; and IBS, with obesity; New dx of prediabetes? Vs DM.    F/u on paraplegia.    Pain in back has gone away on R side.  Infection based on MRI- readings back to normal  ESR and CRP back to his normal levels-   Hasn't been taking muscle relaxers-  Much since pain has been better- overall- 1/10.   No problems with nausea.   Mainly has pain when gets out of recliner- or out of the car, etc.    Not using exercise ball- wife said cannot get him to sit on it.    Did use tennis ball- in recliner- did pressure on spot in back and was helpful.    Tried cutting back to Duloxetine in past to 30 mg daily-   Tried Cilias- but no effect Hasn't tried Viagra.    Pain Inventory Average Pain 1 Pain Right Now 1 My pain is sharp  In the last 24 hours, has pain interfered with the following? General activity 0 Relation with others 0 Enjoyment of life 0 What TIME of day is your pain at its worst? varies Sleep (in general) Good  Pain is worse with: standing Pain improves with: medication Relief from Meds: 8  Family History  Problem Relation Age of Onset   Hypertension Mother    Arthritis Mother    Stomach cancer Father    Hypertension Brother    Social History   Socioeconomic History   Marital status: Married    Spouse name: Not on file   Number of children: 2   Years of education: Not on file   Highest education level: Not on file  Occupational History    Not on file  Tobacco Use   Smoking status: Never   Smokeless tobacco: Never  Vaping Use   Vaping status: Never Used  Substance and Sexual Activity   Alcohol use: Not Currently    Comment: rare- social    Drug use: No   Sexual activity: Yes  Other Topics Concern   Not on file  Social History Narrative   Not on file   Social Determinants of Health   Financial Resource Strain: Not on file  Food Insecurity: No Food Insecurity (11/20/2022)   Hunger Vital Sign    Worried About Running Out of Food in the Last Year: Never true    Ran Out of Food in the Last Year: Never true  Transportation Needs: No Transportation Needs (11/20/2022)   PRAPARE - Administrator, Civil Service (Medical): No    Lack of Transportation (Non-Medical): No  Physical Activity: Not on file  Stress: Not on file  Social Connections: Not on file   Past Surgical History:  Procedure Laterality Date   ABDOMINAL EXPOSURE N/A 04/18/2020   Procedure: ABDOMINAL EXPOSURE;  Surgeon: [name], MD;  Location: [facility] OR;  Service: Vascular;  Laterality: N/A;   ANTERIOR LAT LUMBAR FUSION Left 04/18/2020   Procedure: ANTERIOR LATERAL LUMBAR  FUSION LUMBAR TWO-THREE, LUMBAR THREE-FOUR;  Surgeon: [name], MD;  Location: [facility] OR;  Service: Neurosurgery;  Laterality: Left;   ANTERIOR LUMBAR FUSION N/A 04/18/2020   Procedure: Lumbar Four-Five Lumbar Five- Sacral One Anterior lumbar Interbody Fusion;  Surgeon: [name], MD;  Location[facility] OR;  Service: Neurosurgery;  Laterality: N/A;   APPLICATION OF INTRAOPERATIVE CT SCAN N/A 04/21/2020   Procedure: APPLICATION OF INTRAOPERATIVE CT SCAN;  Surgeon: [name], MD;  Location: [facility] OR;  Service: Neurosurgery;  Laterality: N/A;   BACK SURGERY     lumbar disc surgery   COLONOSCOPY W/ POLYPECTOMY     HERNIA REPAIR Left    left inguinal hernia repair   IR RADIOLOGIST EVAL & MGMT  02/24/2021   KNEE ARTHROSCOPY Right 2003    -scope for meniscus   LUMBAR  WOUND DEBRIDEMENT N/A 01/13/2021   Procedure: THORACOLUMBAR WOUND DEBRIDEMENT;  Surgeon: [name], MD;  Location: [facility] OR;  Service: Neurosurgery;  Laterality: N/A;   LUMBAR WOUND DEBRIDEMENT N/A 03/14/2021   Procedure: WOUND EXPLORATION AND DEBRIDEMENT OF ABSCESS;  Surgeon: Lisbeth Renshaw, MD;  Location: [facility] OR;  Service: Neurosurgery;  Laterality: N/A;   POSTERIOR LUMBAR FUSION 4 LEVEL N/A 04/21/2020   Procedure: Thoracic ten to pelvis pedicle screw fixation with osteotomies;  Surgeon: [name], MD;  Location: [facility] OR;  Service: Neurosurgery;  Laterality: N/A;   POSTERIOR LUMBAR FUSION 4 LEVEL N/A 03/31/2021   Procedure: Thoracic six to Thoracic nine  Posterior decompression/Fusion with removal hardware thoracic ten;  Surgeon: [name], MD;  Location: [facility] OR;  Service: Neurosurgery;  Laterality: N/A;   THORACIC LAMINECTOMY FOR EPIDURAL ABSCESS N/A 03/14/2021   Procedure: THORACIC LAMINECTOMY, THORACIC NINE-THORACIC TEN;  Surgeon: Lisbeth Renshaw, MD;  Location: [facility] OR;  Service: Neurosurgery;  Laterality: N/A;   TOTAL HIP ARTHROPLASTY Left 06/15/2016   Procedure: LEFT TOTAL HIP ARTHROPLASTY ANTERIOR APPROACH;  Surgeon: [name], MD;  Location: [facility] ORS;  Service: Orthopedics;  Laterality: Left;   Past Surgical History:  Procedure Laterality Date   ABDOMINAL EXPOSURE N/A 04/18/2020   Procedure: ABDOMINAL EXPOSURE;  Surgeon: [name], MD;  Location: [facility] OR;  Service: Vascular;  Laterality: N/A;   ANTERIOR LAT LUMBAR FUSION Left 04/18/2020   Procedure: ANTERIOR LATERAL LUMBAR FUSION LUMBAR TWO-THREE, LUMBAR THREE-FOUR;  Surgeon: [name], MD;  Location: [facility] OR;  Service: Neurosurgery;  Laterality: Left;   ANTERIOR LUMBAR FUSION N/A 04/18/2020   Procedure: Lumbar Four-Five Lumbar Five- Sacral One Anterior lumbar Interbody Fusion;  Surgeon: [name], MD;  Location: [facility] OR;  Service: Neurosurgery;  Laterality: N/A;   APPLICATION OF INTRAOPERATIVE CT SCAN N/A  04/21/2020   Procedure: APPLICATION OF INTRAOPERATIVE CT SCAN;  Surgeon: [name], MD;  Location: [facility] OR;  Service: Neurosurgery;  Laterality: N/A;   BACK SURGERY     lumbar disc surgery   COLONOSCOPY W/ POLYPECTOMY     HERNIA REPAIR Left    left inguinal hernia repair   IR RADIOLOGIST EVAL & MGMT  02/24/2021   KNEE ARTHROSCOPY Right 2003    -scope for meniscus   LUMBAR WOUND DEBRIDEMENT N/A 01/13/2021   Procedure: THORACOLUMBAR WOUND DEBRIDEMENT;  Surgeon: [name], MD;  Location: [facility] OR;  Service: Neurosurgery;  Laterality: N/A;   LUMBAR WOUND DEBRIDEMENT N/A 03/14/2021   Procedure: WOUND EXPLORATION AND DEBRIDEMENT OF ABSCESS;  Surgeon: Lisbeth Renshaw, MD;  Location: [facility] OR;  Service: Neurosurgery;  Laterality: N/A;   POSTERIOR LUMBAR FUSION 4 LEVEL N/A 04/21/2020   Procedure: Thoracic ten to  pelvis pedicle screw fixation with osteotomies;  Surgeon: [name], MD;  Location: [facility] OR;  Service: Neurosurgery;  Laterality: N/A;   POSTERIOR LUMBAR FUSION 4 LEVEL N/A 03/31/2021   Procedure: Thoracic six to Thoracic nine  Posterior decompression/Fusion with removal hardware thoracic ten;  Surgeon: [name], MD;  Location: [facility] OR;  Service: Neurosurgery;  Laterality: N/A;   THORACIC LAMINECTOMY FOR EPIDURAL ABSCESS N/A 03/14/2021   Procedure: THORACIC LAMINECTOMY, THORACIC NINE-THORACIC TEN;  Surgeon: Lisbeth Renshaw, MD;  Location: [facility] OR;  Service: Neurosurgery;  Laterality: N/A;   TOTAL HIP ARTHROPLASTY Left 06/15/2016   Procedure: LEFT TOTAL HIP ARTHROPLASTY ANTERIOR APPROACH;  Surgeon: [name], MD;  Location: [facility] ORS;  Service: Orthopedics;  Laterality: Left;   Past Medical History:  Diagnosis Date   Arthritis    arthritis-bilateral knees, left Hip.   Degenerative lumbar spinal stenosis    EKG abnormality    06-08-16- being evaluated by cardiologist  in [place]   Idiopathic scoliosis of lumbar region    Joint stiffness    Knee pain    Leg swelling    Low back  pain    Achy, shooting pain.  Hurts to Walk or stand up straight.   Lumbar radiculopathy    Scoliosis concern    Sleep apnea    Bipap use nightly 25/19 -3l/m oxygen   Spondylolisthesis of lumbar region    Transfusion history    [place] ,[place] after bleeding post colon polyp removal and colonscopy procedure   Ht 5\' 8"  (1.727 m)   Wt 290 lb (131.5 kg)   BMI 44.09 kg/m   Opioid Risk Score:   Fall Risk Score:  `1  Depression screen [facility] 2/9     04/18/2023    1:03 PM 03/03/2023   10:19 AM 01/17/2023   10:40 AM 01/14/2023    9:34 AM 11/05/2022   10:43 AM 07/05/2022   10:19 AM 05/04/2022   10:16 AM  Depression screen [facility] 2/9  Decreased Interest 0 0 0 0 0 0 0  Down, Depressed, Hopeless 0 0 0 0 0 0 0  PHQ - 2 Score 0 0 0 0 0 0 0      Review of Systems  Musculoskeletal:  Positive for back pain and gait problem.       B/L knee pain   All other systems reviewed and are negative.     Objective:   Physical Exam  Awake, alert, appropriate accompanied by wife, NAD Using RW to walk- difficulty standing up- pushed off bed 2 times to get off table Trigger points in lumbar and lower thoracic paraspinals.      Assessment & Plan:   Pt is a 72 yr old male with hx of epidural abscess with incomplete paraplegia status post T9-T10 laminectomy decompression followed by reconstruction T6-T10 posterior decompression fusion removal of hardware and extension of fusion to the thoracic 11 decompression of spinal cord direct T9-T11 laminectomy arthrodesis T6-T11 03/31/2021; also has post op and chronic pain; muscle spasms; and newly dx'd HTN; as well as OSA, gout; and IBS, with obesity; New dx of prediabetes? Vs DM.    F/u on paraplegia.   Can sit on an exercise ball- instead of recliner- to help muscle tightness- sitting/laying on recliner does NOT help you build muscle at ALL. Needs to sit on couch or upright to build strength in back muscles. Can work on balance as well!- Don't think therapy forever  if necessary  for balance.    2. Patient here  for trigger point injections for  Consent done and on chart.  Cleaned areas with alcohol and injected using a 27 gauge 1.5 inch needle  Injected 3cc- none wasted Using 1% Lidocaine with no EPI  Upper traps Levators Posterior scalenes Middle scalenes Splenius Capitus Pectoralis Major Rhomboids Infraspinatus Teres Major/minor Thoracic paraspinals Lumbar paraspinals- B/L x3 Other injections-    Patient's level of pain prior was 1/10 Current level of pain after injections is muscles maybe a little tighter. 0/10-   There was no bleeding or complications.  Patient was advised to drink a lot of water on day of injections to flush system Will have increased soreness for 12-48 hours after injections.  Can use Lidocaine patches the day AFTER injections Can use heating pad 4-6 hours or ice  AFTER injections  3.  Will reduce Duloxetine 30 mg daily- and try to wean off- wean off.  For nerve pain- if cannot tolerate, go back to 60 mg daily- that has refills for.  Do a month of 30 mg daily- before come off it.   4. Still has B/L foot drop-  level ground better than unlevel ground-    5.  Not on NTG So can prescribe Viagra- 100 mg as needed-  # 15- with Good Rx if need be- 5 refills.  Discussed how Duloxetine can prolong time til orgasm as well.   6. Went over retrograde ejaculation as well as brain is area of Orgasm.  Discussed intimacy in general.    7. F/U in 3 months- double appt-    I spent a total of 41   minutes on total care today- >50% coordination of care- due to d/w pt about intimacy; nerve pain; 8 minutes on injections; and building balance-

## 2023-04-18 NOTE — Patient Instructions (Signed)
Pt is a 72 yr old male with hx of epidural abscess with incomplete paraplegia status post T9-T10 laminectomy decompression followed by reconstruction T6-T10 posterior decompression fusion removal of hardware and extension of fusion to the thoracic 11 decompression of spinal cord direct T9-T11 laminectomy arthrodesis T6-T11 03/31/2021; also has post op and chronic pain; muscle spasms; and newly dx'd HTN; as well as OSA, gout; and IBS, with obesity; New dx of prediabetes? Vs DM.    F/u on paraplegia.   Can sit on an exercise ball- instead of recliner- to help muscle tightness- sitting/laying on recliner does NOT help you build muscle at ALL. Needs to sit on couch or upright to build strength in back muscles. Can work on balance as well!- Don't think therapy forever if necessary  for balance.    2. Patient here for trigger point injections for  Consent done and on chart.  Cleaned areas with alcohol and injected using a 27 gauge 1.5 inch needle  Injected 3cc- none wasted Using 1% Lidocaine with no EPI  Upper traps Levators Posterior scalenes Middle scalenes Splenius Capitus Pectoralis Major Rhomboids Infraspinatus Teres Major/minor Thoracic paraspinals Lumbar paraspinals- B/L x3 Other injections-    Patient's level of pain prior was 1/10 Current level of pain after injections is muscles maybe a little tighter. 0/10-   There was no bleeding or complications.  Patient was advised to drink a lot of water on day of injections to flush system Will have increased soreness for 12-48 hours after injections.  Can use Lidocaine patches the day AFTER injections Can use heating pad 4-6 hours or ice  AFTER injections  3.  Will reduce Duloxetine 30 mg daily- and try to wean off- wean off.  For nerve pain- if cannot tolerate, go back to 60 mg daily- that has refills for.  Do a month of 30 mg daily- before come off it.   4. Still has B/L foot drop-  level ground better than unlevel ground-     5.  Not on NTG So can prescribe Viagra- 100 mg as needed-  # 15- with Good Rx if need be- 5 refills.  Discussed how Duloxetine can prolong time til orgasm as well.   6. Went over retrograde ejaculation as well as brain is area of Orgasm.  Discussed intimacy in general.    7. F/U in 3 months- double appt-

## 2023-06-02 ENCOUNTER — Ambulatory Visit: Payer: Medicare Other | Admitting: Internal Medicine

## 2023-06-08 ENCOUNTER — Other Ambulatory Visit: Payer: Self-pay

## 2023-06-08 ENCOUNTER — Encounter: Payer: Self-pay | Admitting: Internal Medicine

## 2023-06-08 ENCOUNTER — Ambulatory Visit (INDEPENDENT_AMBULATORY_CARE_PROVIDER_SITE_OTHER): Payer: Medicare Other | Admitting: Internal Medicine

## 2023-06-08 VITALS — BP 127/83 | HR 92 | Temp 97.8°F | Ht 68.0 in | Wt 294.0 lb

## 2023-06-08 DIAGNOSIS — M4620 Osteomyelitis of vertebra, site unspecified: Secondary | ICD-10-CM | POA: Diagnosis not present

## 2023-06-08 DIAGNOSIS — T847XXD Infection and inflammatory reaction due to other internal orthopedic prosthetic devices, implants and grafts, subsequent encounter: Secondary | ICD-10-CM

## 2023-06-08 NOTE — Progress Notes (Signed)
[facility]    Abx: 5/25-01/01/23 cefazolin  Cefadroxil up until 11/05/22; 01/01/23-c (1 gram bid currently)  ASSESSMENT: 72 yo male well known to me, hx t10 to sacral spinal fusion 03/2020, complicated by late (12/2020) T7-T11 epidural abscess/vertebral OM requiring I&D 01/13/2021 (cx ecoli; blood cx negative), repeated admissions for complications including bilateral empyema/retroperitoneal abscess s/p drainage (fluid cx negative) presumed "spill over" from vertebral process (blood cx negative; tte 01/21/21 no evidence valve vegetation), right knee effusion with negative fluid cx 01/28/2021, another I&D 03/14/2021 & 03/31/2021 and more hardware stabilization for worsening epidural abscess at T9-11 level (operative cx and bcx negative), who done well with 20 months of antibiotics suppression, but unfortunately relapsed after 10 days of trial off abx starting 11/04/2021 (fever, e coli bacteremia, 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 He has been walking with front wheel walker since recovered from this process  Culture: 11/19/22 OSH blood cx ecoli (S cefazolin, cipro, bactrim)  11/20/22 blood cx negative 03/14/2021, 03/31/2021 surgical (spinal) fluid swab cx negative 03/14/2021 bcx negative 01/28/21 right knee fluid cx negative 01/26/21 bcx negative 01/20/21, 01/23/21 bcx negative 01/20/21 right and left pleural fluid cx negative 01/13/21 thoracolumbar operative fluid cx ecoli (S cefazolin, cipro, bactrim) 01/10/21 bcx negative   He had sepsis and worsening back pain about 10 days after stopping cefadroxil. Seen initially in [place], [place] ER and blood cx there was positive for ecoli  Of note, the CLSI had updated cefazolin mic to be </=2 but the cards from both [place] and [place], [place] has MIC</=4 (can't go below that) and sensitivity is presumed for cefazolin  5/26  mri of the entire spine does show new paravertebral 2.7 fluid collection and recurrent t9-11 discitis/om. His repeat 11/20/22 blood cx here so far is negative  Neurosurgery had evaluated and given no new lower extremities neurological deficit or hardware failure, no plan to do any surgery at this time.   For now, agree given relative lack of concerning symptomatology and presence of small fluid collection, would do antibiotics and monitor   As he had failed a trial off abx rather quickly, will commit him to life-long suppression antibiotics treatment   -------------- 12/09/22 id clinic assessment Patient improved pain since back on antibiotics, since hospital discharge Mid back pain worse in morning waking up and getting back to sleep at night, but manageable during day. Tylenol once in a while.  Opat labs pending not in lab-corp website    6 weeks cefazolin until 01/01/23 (or before that weekend)  On the week when he stopped cefazolin, will restart 1000 mg cefadroxil bid    See me mid-end July for follow up    01/14/23 id clinic assessment Patient finished 6 weeks cefazolin on 01/01/23 and transitioned back to cefadroxil 1 gram twice a day Since the infection recurs, his back pain seems better but not yet back to baseline as when he was on prolonged duration of antibiotics before we stopped No fever, chill, n/v/diarrhea Opat labs crp had significantly improved but still elevated  Opat labs crp/esr 7/19  13.5 (<8) 6/25   2  (0.3) /  56 6/18   26 (3) 6/11   48 (3)   Continue cefadroxil 1000mg  bid. In 3-6 months if doing well will decrease dose Ct thoracic spine with contrast f/u 3-4 weeks   ---------- 03/03/23 id clinic assessment Doing better (mild chronic  lower back pain) and recurrent pain on the mid back right side since after stopping cefadroxil briefly now uch improved No n/v/diarrhea No fever, chill  8/26 ct done thoracic spine but no read yet. But suspect improvement  in setting of sx reported  Labs today  F/u 3 months  Continue cefadroxil     06/08/23 id clinic assessment Doing well at baseline. Once in a while intermittent lower back left paralumbar Stable lower strength and walking with FWW; he walks inside his home some with cane/fww No abx side effect Crp had trended down and normalized by early 02/2023   Will decrease cefadroxil dose to 500 mg twice a day Labs today F/u 6 months No further plan to take him off abx            ------------------------ HPI: 72 yo male well known to me, hx t10 to sacral spinal fusion 03/2020, complicated by late (12/2020) T7-T11 epidural abscess/vertebral OM requiring I&D 01/13/2021 (cx ecoli; blood cx negative), repeated admissions for complications including bilateral empyema/retroperitoneal abscess s/p drainage (fluid cx negative) presumed "spill over" from vertebral process (blood cx negative; tte 01/21/21 no evidence valve vegetation), right knee effusion with negative fluid cx 01/28/2021, another I&D 03/14/2021 & 03/31/2021 and more hardware stabilization for worsening epidural abscess at T9-11 level (operative cx and bcx negative), who done well with 20 months of antibiotics suppression, but unfortunately relapsed after 10 days of trial off abx starting 11/04/2021 (fever, e coli bacteremia, 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 He has been walking with front wheel walker since recovered from this process. He is here today for hospital follow up   Culture: 11/19/22 OSH blood cx ecoli (S cefazolin, cipro, bactrim)  11/20/22 blood cx negative 03/14/2021, 03/31/2021 surgical (spinal) fluid swab cx negative 03/14/2021 bcx negative 01/28/21 right knee fluid cx negative 01/26/21 bcx negative 01/20/21, 01/23/21 bcx negative 01/20/21 right and left pleural fluid cx negative 01/13/21 thoracolumbar operative  fluid cx ecoli (S cefazolin, cipro, bactrim) 01/10/21 bcx negative   He had sepsis and worsening back pain about 10 days after stopping cefadroxil. Seen initially in [place], [place] ER and blood cx there was positive for ecoli  Of note, the CLSI had updated cefazolin mic to be </=2 but the cards from both [place] and [place], [place] has MIC</=4 (can't go below that) and sensitivity is presumed for cefazolin  5/26 mri of the entire spine does show new paravertebral 2.7 fluid collection and recurrent t9-11 discitis/om. His repeat 11/20/22 blood cx here so far is negative  Neurosurgery had evaluated and given no new lower extremities neurological deficit or hardware failure, no plan to do any surgery at this time.   For now, agree given relative lack of concerning symptomatology and presence of small fluid collection, would do antibiotics and monitor   As he had failed a trial off abx rather quickly, will commit him to life-long suppression antibiotics treatment   --------------- 03/03/23 id clinic f/u  See a/p above for detail of today's visit     Review of Systems: ROS All other ROS was negative, except mentioned above     OBJECTIVE: Vitals:   06/08/23 1550  BP: 127/83  Pulse: 92  Temp: 97.8 F (36.6 C)  TempSrc: Temporal  SpO2: 94%  Weight: 294 lb (133.4 kg)  Height: 5\' 8"  (1.727 m)   Body mass index is 44.7 kg/m.  Physical Exam General/constitutional: no distress, pleasant, walks with fww  HEENT: Normocephalic, PER, Conj Clear, EOMI, Oropharynx clear Neck supple CV: rrr no mrg Lungs: clear to auscultation, normal respiratory effort Abd: Soft, Nontender Ext: no edema Skin: No Rash Neuro: nonfocal MSK: nontender back   Lab Results Lab Results  Component Value Date   WBC 10.0 03/03/2023   HGB 13.3 03/03/2023   HCT 40.3 03/03/2023   MCV 85.4 03/03/2023   PLT 412 (H) 03/03/2023    Lab Results  Component Value Date   CREATININE 0.89 03/03/2023   BUN 27 (H)  03/03/2023   NA 140 03/03/2023   K 5.1 03/03/2023   CL 102 03/03/2023   CO2 28 03/03/2023    Lab Results  Component Value Date   ALT 18 03/03/2023   AST 19 03/03/2023   ALKPHOS 67 11/20/2022   BILITOT 0.4 03/03/2023      Microbiology: No results found for this or any previous visit (from the past 240 hour(s)).    Serology:   Imaging: If present, new imagings (plain films, ct scans, and mri) have been personally visualized and interpreted; radiology reports have been reviewed. Decision making incorporated into the Impression / Recommendations.   11/20/22 cxr FINDINGS: Stable cardiomediastinal contours. Low lung volumes. No signs of pleural effusion or edema. No airspace opacities identified. Spinal fixation hardware noted within the mid and lower thoracic spine. No acute osseous findings.   IMPRESSION: Low lung volumes. No acute findings.   11/21/22 mri c, t, and lumbar spine IMPRESSION: CERVICAL SPINE:   1. Fluid and enhancement within the C3-C4 disc space with normal adjacent endplates and no paravertebral inflammatory change, favored to be degenerative in etiology. 2. 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 3. Multilevel cervical spondylosis as described above. Moderate or severe bilateral neuroforaminal stenosis from C2-C3 through C6-C7. Moderate spinal canal stenosis at C4-C5.   THORACIC SPINE:   1. Recurrent T10-T11 osteomyelitis discitis with paravertebral inflammatory change and bilateral paravertebral abscesses measuring 2.7 x 2.1 cm on the left and 2.7 x 1.2 cm on the right. Evaluation for epidural involvement is limited due to hardware artifact obscuring the lower thoracic spinal canal. 2. Similar chronic sequela of osteomyelitis-discitis at T9-T10 without recurrence. 3. New adjacent segment disease at T5-T6 with large left subarticular disc extrusion, worsening left-sided facet arthropathy, and  new moderate left neuroforaminal stenosis. 4. Recurrent small bilateral pleural effusions.   LUMBAR SPINE:   1. No evidence of osteomyelitis-discitis. 2. Postsurgical changes from prior thoracolumbosacral fusion.      03/2022 thoracic spine mri 1. Interval superior extension of the thoracolumbar fusion to include T6 through the sacrum. 2. The previously demonstrated changes of diskitis and osteomyelitis at T9-10 and T10-11 have substantially improved, and there is no evidence of residual or recurrent epidural or paraspinal fluid collection. There are some residual marrow changes within the T10 vertebral body, but no progressive bone destruction. 3. No new osseous abnormalities. 4. Possible subpleural right lower lobe pulmonary nodule. Further evaluation with chest CT recommended.     Raymondo Band, MD [facility] [phone] pager    06/08/2023, 4:04 PM

## 2023-06-08 NOTE — Patient Instructions (Signed)
You are doing well and we can do cefadroxil 500 mg twice a day (1 tablet twice a day)   Labs today   I'll see you in 6 months

## 2023-06-09 LAB — COMPLETE METABOLIC PANEL WITH GFR
AG Ratio: 1.8 (calc) (ref 1.0–2.5)
ALT: 16 U/L (ref 9–46)
AST: 16 U/L (ref 10–35)
Albumin: 4.4 g/dL (ref 3.6–5.1)
Alkaline phosphatase (APISO): 58 U/L (ref 35–144)
BUN/Creatinine Ratio: 25 (calc) — ABNORMAL HIGH (ref 6–22)
BUN: 26 mg/dL — ABNORMAL HIGH (ref 7–25)
CO2: 24 mmol/L (ref 20–32)
Calcium: 9.4 mg/dL (ref 8.6–10.3)
Chloride: 105 mmol/L (ref 98–110)
Creat: 1.06 mg/dL (ref 0.70–1.28)
Globulin: 2.4 g/dL (ref 1.9–3.7)
Glucose, Bld: 124 mg/dL — ABNORMAL HIGH (ref 65–99)
Potassium: 4.6 mmol/L (ref 3.5–5.3)
Sodium: 139 mmol/L (ref 135–146)
Total Bilirubin: 0.4 mg/dL (ref 0.2–1.2)
Total Protein: 6.8 g/dL (ref 6.1–8.1)
eGFR: 75 mL/min/{1.73_m2} (ref >=60)

## 2023-06-09 LAB — CBC
HCT: 42.6 % (ref 38.5–50.0)
Hemoglobin: 14.3 g/dL (ref 13.2–17.1)
MCH: 30 pg (ref 27.0–33.0)
MCHC: 33.6 g/dL (ref 32.0–36.0)
MCV: 89.5 fL (ref 80.0–100.0)
MPV: 10.4 fL (ref 7.5–12.5)
Platelets: 334 10*3/uL (ref 140–400)
RBC: 4.76 10*6/uL (ref 4.20–5.80)
RDW: 13.7 % (ref 11.0–15.0)
WBC: 9 10*3/uL (ref 3.8–10.8)

## 2023-06-09 LAB — C-REACTIVE PROTEIN: CRP: 3.4 mg/L (ref <8.0)

## 2023-06-12 ENCOUNTER — Encounter: Payer: Self-pay | Admitting: Internal Medicine

## 2023-07-18 ENCOUNTER — Encounter: Payer: Self-pay | Admitting: Physical Medicine and Rehabilitation

## 2023-07-18 ENCOUNTER — Encounter
Payer: Medicare Other | Attending: Physical Medicine and Rehabilitation | Admitting: Physical Medicine and Rehabilitation

## 2023-07-18 VITALS — BP 137/85 | HR 100 | Wt 284.0 lb

## 2023-07-18 DIAGNOSIS — M21372 Foot drop, left foot: Secondary | ICD-10-CM | POA: Diagnosis present

## 2023-07-18 DIAGNOSIS — M21371 Foot drop, right foot: Secondary | ICD-10-CM | POA: Insufficient documentation

## 2023-07-18 DIAGNOSIS — R269 Unspecified abnormalities of gait and mobility: Secondary | ICD-10-CM | POA: Diagnosis not present

## 2023-07-18 DIAGNOSIS — G8222 Paraplegia, incomplete: Secondary | ICD-10-CM | POA: Insufficient documentation

## 2023-07-18 MED ORDER — DULOXETINE HCL 30 MG PO CPEP: 30.0000 mg | ORAL_CAPSULE | Freq: Every day | ORAL | 3 refills | Status: DC

## 2023-07-18 MED ORDER — DULOXETINE HCL 60 MG PO CPEP: 60.0000 mg | ORAL_CAPSULE | Freq: Every day | ORAL | 1 refills | Status: DC

## 2023-07-18 NOTE — Progress Notes (Signed)
Subjective:    [name], male    DOB: 1951-02-25, 73 y.o.   MRN: [id number]  HPI   Pt is a 73 yr old male with hx of epidural abscess with incomplete paraplegia status post T9-T10 laminectomy decompression followed by reconstruction T6-T10 posterior decompression fusion removal of hardware and extension of fusion to the thoracic 11 decompression of spinal cord direct T9-T11 laminectomy arthrodesis T6-T11 03/31/2021; also has post op and chronic pain; muscle spasms; and newly dx'd HTN; as well as OSA, gout; and IBS, with obesity; New dx of prediabetes? Vs DM.    F/u on paraplegia.   Don't need trigger point injections-  Injections helped, but pain hasn't really returned.     The thing that "kicked his butt" got UTI in [facility] December.  Had real bad back pain- first set of ABX- still had severe ABX-   Had weaned self off Duloxetine, but went back on that on 60 mg daily-  Going to try and wean off it again. Had been off for a few weeks Had gone to 30 mg daily and then off a total of 1 month.   Pain is pretty much gone in L mid back.   Didn't think he went to bathroom for 1 week in the hospital with mother, so thinks that's what triggered UTI- dehydration.   Viagra didn't work.  For intimacy.    Has an AFO for L side- but hasn't been using it-   Has not been moving much- and so might have gotten weaker? No falls- no near falls- RW- outside the house- in house cane or furniture or Rolator  Social HX: Mother living with them for 1 year- mind clear- but confused at night- basically bedrest right now.   Mother fell Dec 1st- and Fx'd C1/2 vertebrae- on hospice- and doing well.  When went to ED- found pneumonia- and C1/2 fx.          Pain Inventory Average Pain  0-1 Pain Right Now 0 My pain is intermittent and stabbing  LOCATION OF PAIN  No pain today. Lower back pain on &  off  BOWEL Number of stools per week:  5   BLADDER Normal    Mobility use a cane use a walker ability to climb steps?  no do you drive?  yes Do you have any goals in this area?  yes  Function retired  Neuro/Psych weakness numbness trouble walking  Prior Studies Any changes since last visit?  no  Physicians involved in your care Any changes since last visit?  no   Family History  Problem Relation Age of Onset   Hypertension Mother    Arthritis Mother    Stomach cancer Father    Hypertension Brother    Social History   Socioeconomic History   Marital status: Married    Spouse name: Not on file   Number of children: 2   Years of education: Not on file   Highest education level: Not on file  Occupational History   Not on file  Tobacco Use   Smoking status: Never   Smokeless tobacco: Never  Vaping Use   Vaping status: Never Used  Substance and Sexual Activity   Alcohol use: Not Currently    Comment: rare- social    Drug use: No   Sexual activity: Yes  Other Topics Concern   Not on file  Social History Narrative   Not on file   Social Drivers of Health  Financial Resource Strain: Not on file  Food Insecurity: No Food Insecurity (11/20/2022)   Hunger Vital Sign    Worried About Running Out of Food in the Last Year: Never true    Ran Out of Food in the Last Year: Never true  Transportation Needs: No Transportation Needs (11/20/2022)   PRAPARE - Administrator, Civil Service (Medical): No    Lack of Transportation (Non-Medical): No  Physical Activity: Not on file  Stress: Not on file  Social Connections: Not on file   Past Surgical History:  Procedure Laterality Date   ABDOMINAL EXPOSURE N/A 04/18/2020   Procedure: ABDOMINAL EXPOSURE;  Surgeon: [name], MD;  Location: [facility] OR;  Service: Vascular;  Laterality: N/A;   ANTERIOR LAT LUMBAR FUSION Left 04/18/2020   Procedure: ANTERIOR LATERAL LUMBAR FUSION LUMBAR TWO-THREE, LUMBAR THREE-FOUR;  Surgeon: [name],  MD;  Location: [facility] OR;  Service: Neurosurgery;  Laterality: Left;   ANTERIOR LUMBAR FUSION N/A 04/18/2020   Procedure: Lumbar Four-Five Lumbar Five- Sacral One Anterior lumbar Interbody Fusion;  Surgeon: [name], MD;  Location: [facility] OR;  Service: Neurosurgery;  Laterality: N/A;   APPLICATION OF INTRAOPERATIVE CT SCAN N/A 04/21/2020   Procedure: APPLICATION OF INTRAOPERATIVE CT SCAN;  Surgeon: [name], MD;  Location: [facility] OR;  Service: Neurosurgery;  Laterality: N/A;   BACK SURGERY     lumbar disc surgery   COLONOSCOPY W/ POLYPECTOMY     HERNIA REPAIR Left    left inguinal hernia repair   IR RADIOLOGIST EVAL & MGMT  02/24/2021   KNEE ARTHROSCOPY Right 2003    -scope for meniscus   LUMBAR WOUND DEBRIDEMENT N/A 01/13/2021   Procedure: THORACOLUMBAR WOUND DEBRIDEMENT;  Surgeon: [name], MD;  Location: [facility] OR;  Service: Neurosurgery;  Laterality: N/A;   LUMBAR WOUND DEBRIDEMENT N/A 03/14/2021   Procedure: WOUND EXPLORATION AND DEBRIDEMENT OF ABSCESS;  Surgeon: Lisbeth Renshaw, MD;  Location: [facility] OR;  Service: Neurosurgery;  Laterality: N/A;   POSTERIOR LUMBAR FUSION 4 LEVEL N/A 04/21/2020   Procedure: Thoracic ten to pelvis pedicle screw fixation with osteotomies;  Surgeon: [name], MD;  Location: [facility] OR;  Service: Neurosurgery;  Laterality: N/A;   POSTERIOR LUMBAR FUSION 4 LEVEL N/A 03/31/2021   Procedure: Thoracic six to Thoracic nine  Posterior decompression/Fusion with removal hardware thoracic ten;  Surgeon: [name], MD;  Location: [facility] OR;  Service: Neurosurgery;  Laterality: N/A;   THORACIC LAMINECTOMY FOR EPIDURAL ABSCESS N/A 03/14/2021   Procedure: THORACIC LAMINECTOMY, THORACIC NINE-THORACIC TEN;  Surgeon: Lisbeth Renshaw, MD;  Location: [facility] OR;  Service: Neurosurgery;  Laterality: N/A;   TOTAL HIP ARTHROPLASTY Left 06/15/2016   Procedure: LEFT TOTAL HIP ARTHROPLASTY ANTERIOR APPROACH;  Surgeon: [name], MD;  Location: [facility] ORS;  Service: Orthopedics;  Laterality:  Left;   Past Medical History:  Diagnosis Date   Arthritis    arthritis-bilateral knees, left Hip.   Degenerative lumbar spinal stenosis    EKG abnormality    06-08-16- being evaluated by cardiologist  in [place]   Idiopathic scoliosis of lumbar region    Joint stiffness    Knee pain    Leg swelling    Low back pain    Achy, shooting pain.  Hurts to Walk or stand up straight.   Lumbar radiculopathy    Scoliosis concern    Sleep apnea    Bipap use nightly 25/19 -3l/m oxygen   Spondylolisthesis of lumbar region    Transfusion history    [place] ,[place]  after bleeding post colon polyp removal and colonscopy procedure   BP 137/85   Pulse 100   Wt 284 lb (128.8 kg)   SpO2 97%   BMI 43.18 kg/m   Opioid Risk Score:   Fall Risk Score:  `1  Depression screen [facility] 2/9     07/18/2023   12:57 PM 04/18/2023    1:03 PM 03/03/2023   10:19 AM 01/17/2023   10:40 AM 01/14/2023    9:34 AM 11/05/2022   10:43 AM 07/05/2022   10:19 AM  Depression screen PHQ 2/9  Decreased Interest 0 0 0 0 0 0 0  Down, Depressed, Hopeless 0 0 0 0 0 0 0  PHQ - 2 Score 0 0 0 0 0 0 0    Review of Systems  Musculoskeletal:  Positive for back pain and gait problem.  All other systems reviewed and are negative.      Objective:   Physical Exam  Awake alert, appropriate, using RW to walk; and accompanied by wife, NAD  MSK: RLE- HF 4+/5; KE 5-/5; KF 5-/5; DF 4-/5; PF 4+/5 LLE- HF 4/5; KE 5-/5 and KF 5-/5; DF 2- to 2/5 and PF 4-/5   Smiling/laughing at times  Gait- foot drop B/L Worse on L.        Assessment & Plan:   Pt is a 73 yr old male with hx of epidural abscess with incomplete paraplegia status post T9-T10 laminectomy decompression followed by reconstruction T6-T10 posterior decompression fusion removal of hardware and extension of fusion to the thoracic 11 decompression of spinal cord direct T9-T11 laminectomy arthrodesis T6-T11 03/31/2021; also has post op and chronic pain; muscle spasms;  and newly dx'd HTN; as well as OSA, gout; and IBS, with obesity; New dx of prediabetes? Vs DM.    F/u on paraplegia.   Will try to wean off Duloxetine- go to 30 mg daily.  Will send in refill for 30 mg daily- and give 3 months with 2 refills- and can some at home just in case, if needs to restart.   we discussed further- decided to do 60 mg daily 1 more month to get restarted on exercising and then can wean.    2.  Viagra didn't work- so might benefit from calling Urology- call in [place]- tell you had a SCI and having intimacy issues and max dose Viagra didn't work-   3. Needs to build endurance again- as well as get at counter-  get on heels and toes B/L feet- also can try to tap toes as well- to build strength.  Also work on walking.  Came up with Home exercise program- needs be done daily at minimum.  Do something every hour when watching TV.    4. F/U in  4 months-    I spent a total of 34   minutes on total care today- >50% coordination of care- due to discussion about intimacy and weaning off duloxetine/back pain.

## 2023-07-18 NOTE — Patient Instructions (Signed)
Pt is a 73 yr old male with hx of epidural abscess with incomplete paraplegia status post T9-T10 laminectomy decompression followed by reconstruction T6-T10 posterior decompression fusion removal of hardware and extension of fusion to the thoracic 11 decompression of spinal cord direct T9-T11 laminectomy arthrodesis T6-T11 03/31/2021; also has post op and chronic pain; muscle spasms; and newly dx'd HTN; as well as OSA, gout; and IBS, with obesity; New dx of prediabetes? Vs DM.    F/u on paraplegia.   Will try to wean off Duloxetine- go to 30 mg daily.  Will send in refill for 30 mg daily- and give 3 months with 2 refills- and can some at home just in case, if needs to restart.   e discussed further- decided to do 60 mg daily 1 more month to get restarted on exercising and then can wean.    2.  Viagra didn't work- so might benefit from calling Urology- call in [place]- tell you had a SCI and having intimacy issues and max dose Viagra didn't work-   3. Needs to build endurance again- as well as get at counter-  get on heels and toes B/L feet- also can try to tap toes as well- to build strength.  Also work on walking.  Came up with Home exercise program- needs be done daily at minimum.  Do something every hour when watching TV.    4. F/U in  4 months-

## 2023-07-28 ENCOUNTER — Other Ambulatory Visit: Payer: Self-pay | Admitting: Infectious Diseases

## 2023-10-25 ENCOUNTER — Other Ambulatory Visit: Payer: Self-pay | Admitting: Physical Medicine and Rehabilitation

## 2023-10-28 NOTE — Telephone Encounter (Signed)
 Refill request sent for Duloxetine , according to note on 07/18/2023 patient is to wean off medication.  Please advise

## 2023-11-18 ENCOUNTER — Encounter: Payer: Self-pay | Admitting: Physical Medicine and Rehabilitation

## 2023-11-18 ENCOUNTER — Encounter
Payer: Medicare Other | Attending: Physical Medicine and Rehabilitation | Admitting: Physical Medicine and Rehabilitation

## 2023-11-18 VITALS — BP 121/79 | HR 54 | Ht 68.0 in | Wt 292.0 lb

## 2023-11-18 DIAGNOSIS — M7918 Myalgia, other site: Secondary | ICD-10-CM | POA: Diagnosis not present

## 2023-11-18 DIAGNOSIS — R600 Localized edema: Secondary | ICD-10-CM | POA: Diagnosis present

## 2023-11-18 DIAGNOSIS — M4620 Osteomyelitis of vertebra, site unspecified: Secondary | ICD-10-CM | POA: Diagnosis present

## 2023-11-18 DIAGNOSIS — M4644 Discitis, unspecified, thoracic region: Secondary | ICD-10-CM | POA: Diagnosis present

## 2023-11-18 DIAGNOSIS — G8222 Paraplegia, incomplete: Secondary | ICD-10-CM | POA: Insufficient documentation

## 2023-11-18 NOTE — Patient Instructions (Signed)
 Pt is a 73 yr old male with hx of epidural abscess with incomplete paraplegia status post T9-T10 laminectomy decompression followed by reconstruction T6-T10 posterior decompression fusion removal of hardware and extension of fusion to the thoracic 11 decompression of spinal cord direct T9-T11 laminectomy arthrodesis T6-T11 03/31/2021; also has post op and chronic pain; muscle spasms; and newly dx'd HTN; as well as OSA, gout; and IBS, with morbid obesity- BMI 44.40; New dx of prediabetes? Vs DM. On Metformin.    F/u on paraplegia.  Really needs to use L AFO- because L foot weakness.  2. Needs to elevate legs to level of heart to improve LE swelling   3. Speak to PCP about fluid pill vs what else they think might be the cause.    4.  Viagra  didn't work for him- Sildenafil - no improvement. Did call Urology- but never heard back.    5. Will d/w pt again at next visit- about sending to Urology. Dr Pat Bonier?   6. Not taking Duloxetine  right now- but call me if needs it in future- would start with 30 mg daily again if need be.    7. When you find out, let me know what your latest HbA1c is- that's how well your questionable Diabetes is managed?   8. F/U in 4 months- double appt-  [facility]

## 2023-11-18 NOTE — Progress Notes (Signed)
 Subjective:    [name], male    DOB: 09-Dec-1950, 73 y.o.   MRN: [id number]  HPI  Pt is a 73 yr old male with hx of epidural abscess with incomplete paraplegia status post T9-T10 laminectomy decompression followed by reconstruction T6-T10 posterior decompression fusion removal of hardware and extension of fusion to the thoracic 11 decompression of spinal cord direct T9-T11 laminectomy arthrodesis T6-T11 03/31/2021; also has post op and chronic pain; muscle spasms; and newly dx'd HTN; as well as OSA, gout; and IBS, with morbid obesity- current BMI 44.40; New dx of prediabetes? Vs DM. On Metformin   F/u on paraplegia.   Last Memorial Day Dr Shereen Dike had taken him off PO ABX and then ended up having infection due to back infection /epidural abscess.  Life crazy since December 38 year old mother fractured C1/2- neck brace on Hospice since then.  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  Losing a lot of sitters as well!!!  Pt's mother is sundowning at 4pm and later. Not exercising like he needs. Cannot come out of her sight, even pt's wife isn't enough her her to exercise, do anything. Sleeping at her bedside.  Pt- Sleeping in lift chair since wife's surgery.    Pt's wife had knee replacement 6 weeks ago- incision looks basically healed.    Duloxetine - was weaning off- and then went back on- so NOW back off 30mg  for 5 days so far.  Just when stepped up at counter- straightened up and 1 little spot had shooting pain. Certain movements set it off 0/10 pain for most of the day.  Was in car for 90 minutes prior to getting here.  Getting out on riding lawnmower- can get up on that- needs ramp to get on ONE of tractor lawnmowers, but can get int he other one. - 2x/week right now.  7 acres! 2 hours max at a time- sometimes 2 days in a row.       Not using L AFO-  stopped using that completely.  No falls- no near falls.   Does  fairly often moves around in house without RW_ does furniture surfing sometimes, but  25% of time without RW in house.   Has low chairs to get out of - a lot to get out of them.   Legs swelling more- not wearing compression socks as much- sometimes barefooted in home.   Wife still needs to help him  to get compression socks and shoes on.    Pain Inventory Average Pain 0 Pain Right Now 1 My pain is sharp  In the last 24 hours, has pain interfered with the following? General activity 1 Relation with others 0 Enjoyment of life 0 What TIME of day is your pain at its worst? varies Sleep (in general) Fair  Pain is worse with: some activites Pain improves with: rest Relief from Meds: 5  Family History  Problem Relation Age of Onset   Hypertension Mother    Arthritis Mother    Stomach cancer Father    Hypertension Brother    Social History   Socioeconomic History   Marital status: Married    Spouse name: Not on file   Number of children: 2   Years of education: Not on file   Highest education level: Not on file  Occupational History   Not on file  Tobacco Use   Smoking status: Never   Smokeless  tobacco: Never  Vaping Use   Vaping status: Never Used  Substance and Sexual Activity   Alcohol use: Not Currently    Comment: rare- social    Drug use: No   Sexual activity: Yes  Other Topics Concern   Not on file  Social History Narrative   Not on file   Social Drivers of Health   Financial Resource Strain: Not on file  Food Insecurity: No Food Insecurity (11/20/2022)   Hunger Vital Sign    Worried About Running Out of Food in the Last Year: Never true    Ran Out of Food in the Last Year: Never true  Transportation Needs: No Transportation Needs (11/20/2022)   PRAPARE - Administrator, Civil Service (Medical): No    Lack of Transportation (Non-Medical): No  Physical Activity: Not on file  Stress: Not on file  Social Connections: Not on file   Past  Surgical History:  Procedure Laterality Date   ABDOMINAL EXPOSURE N/A 04/18/2020   Procedure: ABDOMINAL EXPOSURE;  Surgeon: Young Hensen, MD;  Location: [facility] OR;  Service: Vascular;  Laterality: N/A;   ANTERIOR LAT LUMBAR FUSION Left 04/18/2020   Procedure: ANTERIOR LATERAL LUMBAR FUSION LUMBAR TWO-THREE, LUMBAR THREE-FOUR;  Surgeon: Manya Sells, MD;  Location: [facility] OR;  Service: Neurosurgery;  Laterality: Left;   ANTERIOR LUMBAR FUSION N/A 04/18/2020   Procedure: Lumbar Four-Five Lumbar Five- Sacral One Anterior lumbar Interbody Fusion;  Surgeon: Manya Sells, MD;  Location: [facility] OR;  Service: Neurosurgery;  Laterality: N/A;   APPLICATION OF INTRAOPERATIVE CT SCAN N/A 04/21/2020   Procedure: APPLICATION OF INTRAOPERATIVE CT SCAN;  Surgeon: Manya Sells, MD;  Location: [facility] OR;  Service: Neurosurgery;  Laterality: N/A;   BACK SURGERY     lumbar disc surgery   COLONOSCOPY W/ POLYPECTOMY     HERNIA REPAIR Left    left inguinal hernia repair   IR RADIOLOGIST EVAL & MGMT  02/24/2021   KNEE ARTHROSCOPY Right 2003    -scope for meniscus   LUMBAR WOUND DEBRIDEMENT N/A 01/13/2021   Procedure: THORACOLUMBAR WOUND DEBRIDEMENT;  Surgeon: Manya Sells, MD;  Location: [facility] OR;  Service: Neurosurgery;  Laterality: N/A;   LUMBAR WOUND DEBRIDEMENT N/A 03/14/2021   Procedure: WOUND EXPLORATION AND DEBRIDEMENT OF ABSCESS;  Surgeon: Augusto Blonder, MD;  Location: [facility] OR;  Service: Neurosurgery;  Laterality: N/A;   POSTERIOR LUMBAR FUSION 4 LEVEL N/A 04/21/2020   Procedure: Thoracic ten to pelvis pedicle screw fixation with osteotomies;  Surgeon: Manya Sells, MD;  Location: [facility] OR;  Service: Neurosurgery;  Laterality: N/A;   POSTERIOR LUMBAR FUSION 4 LEVEL N/A 03/31/2021   Procedure: Thoracic six to Thoracic nine  Posterior decompression/Fusion with removal hardware thoracic ten;  Surgeon: Manya Sells, MD;  Location: [facility] OR;  Service: Neurosurgery;  Laterality: N/A;   THORACIC LAMINECTOMY FOR EPIDURAL  ABSCESS N/A 03/14/2021   Procedure: THORACIC LAMINECTOMY, THORACIC NINE-THORACIC TEN;  Surgeon: Augusto Blonder, MD;  Location: [facility] OR;  Service: Neurosurgery;  Laterality: N/A;   TOTAL HIP ARTHROPLASTY Left 06/15/2016   Procedure: LEFT TOTAL HIP ARTHROPLASTY ANTERIOR APPROACH;  Surgeon: Claiborne Crew, MD;  Location: [facility] ORS;  Service: Orthopedics;  Laterality: Left;   Past Surgical History:  Procedure Laterality Date   ABDOMINAL EXPOSURE N/A 04/18/2020   Procedure: ABDOMINAL EXPOSURE;  Surgeon: Young Hensen, MD;  Location: [facility] OR;  Service: Vascular;  Laterality: N/A;   ANTERIOR LAT LUMBAR FUSION Left 04/18/2020   Procedure: ANTERIOR LATERAL LUMBAR FUSION LUMBAR TWO-THREE, LUMBAR THREE-FOUR;  Surgeon: Manya Sells, MD;  Location: [facility] OR;  Service: Neurosurgery;  Laterality: Left;   ANTERIOR LUMBAR FUSION N/A 04/18/2020   Procedure: Lumbar Four-Five Lumbar Five- Sacral One Anterior lumbar Interbody Fusion;  Surgeon: Manya Sells, MD;  Location: [facility] OR;  Service: Neurosurgery;  Laterality: N/A;   APPLICATION OF INTRAOPERATIVE CT SCAN N/A 04/21/2020   Procedure: APPLICATION OF INTRAOPERATIVE CT SCAN;  Surgeon: Manya Sells, MD;  Location: [facility] OR;  Service: Neurosurgery;  Laterality: N/A;   BACK SURGERY     lumbar disc surgery   COLONOSCOPY W/ POLYPECTOMY     HERNIA REPAIR Left    left inguinal hernia repair   IR RADIOLOGIST EVAL & MGMT  02/24/2021   KNEE ARTHROSCOPY Right 2003    -scope for meniscus   LUMBAR WOUND DEBRIDEMENT N/A 01/13/2021   Procedure: THORACOLUMBAR WOUND DEBRIDEMENT;  Surgeon: Manya Sells, MD;  Location: [facility] OR;  Service: Neurosurgery;  Laterality: N/A;   LUMBAR WOUND DEBRIDEMENT N/A 03/14/2021   Procedure: WOUND EXPLORATION AND DEBRIDEMENT OF ABSCESS;  Surgeon: Augusto Blonder, MD;  Location: [facility] OR;  Service: Neurosurgery;  Laterality: N/A;   POSTERIOR LUMBAR FUSION 4 LEVEL N/A 04/21/2020   Procedure: Thoracic ten to pelvis pedicle screw fixation with osteotomies;   Surgeon: Manya Sells, MD;  Location: [facility] OR;  Service: Neurosurgery;  Laterality: N/A;   POSTERIOR LUMBAR FUSION 4 LEVEL N/A 03/31/2021   Procedure: Thoracic six to Thoracic nine  Posterior decompression/Fusion with removal hardware thoracic ten;  Surgeon: Manya Sells, MD;  Location: [facility] OR;  Service: Neurosurgery;  Laterality: N/A;   THORACIC LAMINECTOMY FOR EPIDURAL ABSCESS N/A 03/14/2021   Procedure: THORACIC LAMINECTOMY, THORACIC NINE-THORACIC TEN;  Surgeon: Augusto Blonder, MD;  Location: [facility] OR;  Service: Neurosurgery;  Laterality: N/A;   TOTAL HIP ARTHROPLASTY Left 06/15/2016   Procedure: LEFT TOTAL HIP ARTHROPLASTY ANTERIOR APPROACH;  Surgeon: Claiborne Crew, MD;  Location: [facility] ORS;  Service: Orthopedics;  Laterality: Left;   Past Medical History:  Diagnosis Date   Arthritis    arthritis-bilateral knees, left Hip.   Degenerative lumbar spinal stenosis    EKG abnormality    06-08-16- being evaluated by cardiologist  in [place]   Idiopathic scoliosis of lumbar region    Joint stiffness    Knee pain    Leg swelling    Low back pain    Achy, shooting pain.  Hurts to Walk or stand up straight.   Lumbar radiculopathy    Scoliosis concern    Sleep apnea    Bipap use nightly 25/19 -3l/m oxygen   Spondylolisthesis of lumbar region    Transfusion history    [place] ,[place] after bleeding post colon polyp removal and colonscopy procedure   BP 121/79   Pulse (!) 54   Ht 5\' 8"  (1.727 m)   Wt 292 lb (132.5 kg)   SpO2 96%   BMI 44.40 kg/m   Opioid Risk Score:   Fall Risk Score:  `1  Depression screen [facility] 2/9     07/18/2023   12:57 PM 04/18/2023    1:03 PM 03/03/2023   10:19 AM 01/17/2023   10:40 AM 01/14/2023    9:34 AM 11/05/2022   10:43 AM 07/05/2022   10:19 AM  Depression screen PH[facility] 2/9  Decreased Interest 0 0 0 0 0 0 0  Down, Depressed, Hopeless 0 0 0 0 0 0 0  PHQ - 2 Score 0 0 0 0 0 0 0      Review of Systems  Musculoskeletal:  Positive for back pain and gait  problem.  All other systems reviewed and are negative.     Objective:   Physical Exam  Awake, alert, appropriate, fair memory; accompanied by wife, NAD Using RW  MSK: RLE- HF 5/5; KE 5-/5; KF 5-/5; DF 4/5 and PF 4+ to 5-/5 LLE-HF 2+/5 - poor ROM; max; KE/KF 5-/5; DF 2/5; PF 5-/5  L knee 3+ crepitus- severe! crunching  Extremities; 3-4+ LE' edema B/L- to knees         Assessment & Plan:   Pt is a 73 yr old male with hx of epidural abscess with incomplete paraplegia status post T9-T10 laminectomy decompression followed by reconstruction T6-T10 posterior decompression fusion removal of hardware and extension of fusion to the thoracic 11 decompression of spinal cord direct T9-T11 laminectomy arthrodesis T6-T11 03/31/2021; also has post op and chronic pain; muscle spasms; and newly dx'd HTN; as well as OSA, gout; and IBS, with morbid obesity- BMI 44.40; New dx of prediabetes? Vs DM. On Metformin.    F/u on paraplegia.  Really needs to use L AFO- because L foot weakness.  2. Needs to elevate legs to level of heart to improve LE swelling   3. Speak to PCP about fluid pill vs what else they think might be the cause.    4.  Viagra  didn't work for him- Sildenafil - no improvement. Did call Urology- but never heard back.    5. Will d/w pt again at next visit- about sending to Urology. Dr Pat Bonier?   6. Not taking Duloxetine  right now- but call me if needs it in future- would start with 30 mg daily again if need be.    7. When you find out, let me know what your latest HbA1c is- that's how well your questionable Diabetes is managed?   8. F/U in 4 months- double appt-  [facility]  9. Still on PO ABX for Osteomyelitis- will be for life. -    I spent a total of  31  minutes on total care today- >50% coordination of care- due to d/w pt about ED, nerve pain and family issues- as well as PO ABX- possible urology referral.

## 2023-12-14 ENCOUNTER — Encounter: Payer: Self-pay | Admitting: Internal Medicine

## 2023-12-14 ENCOUNTER — Ambulatory Visit: Payer: Medicare Other | Admitting: Internal Medicine

## 2023-12-14 ENCOUNTER — Other Ambulatory Visit: Payer: Self-pay

## 2023-12-14 VITALS — BP 147/87 | HR 86 | Temp 97.3°F | Ht 68.0 in | Wt 300.0 lb

## 2023-12-14 DIAGNOSIS — M4624 Osteomyelitis of vertebra, thoracic region: Secondary | ICD-10-CM

## 2023-12-14 DIAGNOSIS — G061 Intraspinal abscess and granuloma: Secondary | ICD-10-CM | POA: Diagnosis not present

## 2023-12-14 DIAGNOSIS — I509 Heart failure, unspecified: Secondary | ICD-10-CM

## 2023-12-14 DIAGNOSIS — M7989 Other specified soft tissue disorders: Secondary | ICD-10-CM

## 2023-12-14 DIAGNOSIS — T847XXD Infection and inflammatory reaction due to other internal orthopedic prosthetic devices, implants and grafts, subsequent encounter: Secondary | ICD-10-CM | POA: Diagnosis not present

## 2023-12-14 DIAGNOSIS — G4733 Obstructive sleep apnea (adult) (pediatric): Secondary | ICD-10-CM

## 2023-12-14 DIAGNOSIS — B9689 Other specified bacterial agents as the cause of diseases classified elsewhere: Secondary | ICD-10-CM | POA: Diagnosis not present

## 2023-12-14 DIAGNOSIS — I50813 Acute on chronic right heart failure: Secondary | ICD-10-CM

## 2023-12-14 NOTE — Patient Instructions (Signed)
 Labs today I have also check bnp to assess your heart function and also ordered echo    Let Daivd Dub at front desk know to see if something close to your house to do echo   See me in 6 months   Follow up with primary care for ongoing workup of leg swelling

## 2023-12-14 NOTE — Addendum Note (Signed)
 Addended by: Arlon Bergamo D on: 12/14/2023 11:06 AM   Modules accepted: Orders

## 2023-12-14 NOTE — Progress Notes (Signed)
 [facility]    Abx: 5/25-01/01/23 cefazolin   Cefadroxil  up until 11/05/22; 01/01/23-c (500 mg bid since 05/2023)  ASSESSMENT: 73 yo male well known to me, hx t10 to sacral spinal fusion 03/2020, complicated by late (12/2020) T7-T11 epidural abscess/vertebral OM requiring I&D 01/13/2021 (cx ecoli; blood cx negative), repeated admissions for complications including bilateral empyema/retroperitoneal abscess s/p drainage (fluid cx negative) presumed spill over from vertebral process (blood cx negative; tte 01/21/21 no evidence valve vegetation), right knee effusion with negative fluid cx 01/28/2021, another I&D 03/14/2021 & 03/31/2021 and more hardware stabilization for worsening epidural abscess at T9-11 level (operative cx and bcx negative), who done well with 20 months of antibiotics suppression, but unfortunately relapsed after 10 days of trial off abx starting 11/04/2021 (fever, e coli bacteremia, 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 He has been walking with front wheel walker since recovered from this process  Culture: 11/19/22 OSH blood cx ecoli (S cefazolin , cipro, bactrim)  11/20/22 blood cx negative 03/14/2021, 03/31/2021 surgical (spinal) fluid swab cx negative 03/14/2021 bcx negative 01/28/21 right knee fluid cx negative 01/26/21 bcx negative 01/20/21, 01/23/21 bcx negative 01/20/21 right and left pleural fluid cx negative 01/13/21 thoracolumbar operative fluid cx ecoli (S cefazolin , cipro, bactrim) 01/10/21 bcx negative   He had sepsis and worsening back pain about 10 days after stopping cefadroxil . Seen initially in [place], [place]  ER and blood cx there was positive for ecoli  Of note, the CLSI had updated cefazolin  mic to be </=2 but the cards from both [place] and [place], [place] has MIC</=4 (can't go below that) and sensitivity is presumed for  cefazolin   5/26 mri of the entire spine does show new paravertebral 2.7 fluid collection and recurrent t9-11 discitis/om. His repeat 11/20/22 blood cx here so far is negative  Neurosurgery had evaluated and given no new lower extremities neurological deficit or hardware failure, no plan to do any surgery at this time.   For now, agree given relative lack of concerning symptomatology and presence of small fluid collection, would do antibiotics and monitor   As he had failed a trial off abx rather quickly, will commit him to life-long suppression antibiotics treatment   -------------- 12/09/22 id clinic assessment Patient improved pain since back on antibiotics, since hospital discharge Mid back pain worse in morning waking up and getting back to sleep at night, but manageable during day. Tylenol  once in a while.  Opat labs pending not in lab-[facility] website    6 weeks cefazolin  until 01/01/23 (or before that weekend)  On the week when he stopped cefazolin , will restart 1000 mg cefadroxil  bid    See me mid-end July for follow up    01/14/23 id clinic assessment Patient finished 6 weeks cefazolin  on 01/01/23 and transitioned back to cefadroxil  1 gram twice a day Since the infection recurs, his back pain seems better but not yet back to baseline as when he was on prolonged duration of antibiotics before we stopped No fever, chill, n/v/diarrhea Opat labs crp had significantly improved but still elevated  Opat labs crp/esr 7/19  13.5 (<8) 6/25   2  (0.3) /  56 6/18   26 (3) 6/11   48 (3)   Continue cefadroxil  1000mg  bid. In 3-6 months if doing well will decrease dose Ct thoracic spine with contrast f/u 3-4 weeks   ---------- 03/03/23 id clinic assessment Doing better (mild  chronic lower back pain) and recurrent pain on the mid back right side since after stopping cefadroxil  briefly now uch improved No n/v/diarrhea No fever, chill  8/26 ct done thoracic spine but no read yet. But  suspect improvement in setting of sx reported  Labs today  F/u 3 months  Continue cefadroxil      06/08/23 id clinic assessment Doing well at baseline. Once in a while intermittent lower back left paralumbar Stable lower strength and walking with FWW; he walks inside his home some with cane/fww No abx side effect Crp had trended down and normalized by early 02/2023   Will decrease cefadroxil  dose to 500 mg twice a day Labs today F/u 6 months No further plan to take him off abx    12/14/23 id clinic assessment #hardware/back om On cefadroxil  maintenance dose 500 mg bid since 05/2023 No side effect Labs today Recurrence off antibiotics so will keep on indefinitely F/u 6 months with me   #worsening lower ext edema in setting sleep apnea. Echo 2022 unable to measure/estimate pulm pressure as no TR jet. Difficult to gauge activity tolerance as getting around with fww. No sign of left heart failure. Risk for pulm htn and will check bnp/tte Advise f/u pcp for ongoing w/u Consider compression wrap of the legs - defer diuresis temporary use to pcp          ------------------------ HPI: 73 yo male well known to me, hx t10 to sacral spinal fusion 03/2020, complicated by late (12/2020) T7-T11 epidural abscess/vertebral OM requiring I&D 01/13/2021 (cx ecoli; blood cx negative), repeated admissions for complications including bilateral empyema/retroperitoneal abscess s/p drainage (fluid cx negative) presumed spill over from vertebral process (blood cx negative; tte 01/21/21 no evidence valve vegetation), right knee effusion with negative fluid cx 01/28/2021, another I&D 03/14/2021 & 03/31/2021 and more hardware stabilization for worsening epidural abscess at T9-11 level (operative cx and bcx negative), who done well with 20 months of antibiotics suppression, but unfortunately relapsed after 10 days of trial off abx starting 11/04/2021 (fever, e coli bacteremia, recurrent T10-11 discitis/OM  with 2.7 cm left and right paravertebral fluid collections, admitted 11/19/21 for further evaluation/management. He has been walking with front wheel walker since recovered from this process. He is here today for hospital follow up   Culture: 11/19/22 OSH blood cx ecoli (S cefazolin , cipro, bactrim)  11/20/22 blood cx negative 03/14/2021, 03/31/2021 surgical (spinal) fluid swab cx negative 03/14/2021 bcx negative 01/28/21 right knee fluid cx negative 01/26/21 bcx negative 01/20/21, 01/23/21 bcx negative 01/20/21 right and left pleural fluid cx negative 01/13/21 thoracolumbar operative fluid cx ecoli (S cefazolin , cipro, bactrim) 01/10/21 bcx negative   He had sepsis and worsening back pain about 10 days after stopping cefadroxil . Seen initially in [place], [place]  ER and blood cx there was positive for ecoli  Of note, the CLSI had updated cefazolin  mic to be </=2 but the cards from both [place] and [place], [place] has MIC</=4 (can't go below that) and sensitivity is presumed for cefazolin   5/26 mri of the entire spine does show new paravertebral 2.7 fluid collection and recurrent t9-11 discitis/om. His repeat 11/20/22 blood cx here so far is negative  Neurosurgery had evaluated and given no new lower extremities neurological deficit or hardware failure, no plan to do any surgery at this time.   For now, agree given relative lack of concerning symptomatology and presence of small fluid collection, would do antibiotics and monitor   As he had failed a trial off abx rather  quickly, will commit him to life-long suppression antibiotics treatment   --------------- 12/14/23 id clinic visit  See a/p above for detail of today's visit     Review of Systems: ROS All other ROS was negative, except mentioned above     OBJECTIVE: Vitals:   12/14/23 1038  BP: (!) 147/87  Pulse: 86  Temp: (!) 97.3 F (36.3 C)  TempSrc: Temporal  SpO2: 96%  Weight: 300 lb (136.1 kg)  Height: 5' 8 (1.727 m)    Body mass index is 45.61 kg/m.  Physical Exam General/constitutional: no distress, pleasant, walks with fww HEENT: Normocephalic, PER, Conj Clear, EOMI, Oropharynx clear Neck supple CV: rrr no mrg; jvp not elevated upright position Lungs: clear to auscultation, normal respiratory effort Abd: Soft, Nontender Ext: 2+ edema to thigh Skin: No Rash Neuro: nonfocal MSK: nontender back   Lab Results Lab Results  Component Value Date   WBC 9.0 06/08/2023   HGB 14.3 06/08/2023   HCT 42.6 06/08/2023   MCV 89.5 06/08/2023   PLT 334 06/08/2023    Lab Results  Component Value Date   CREATININE 1.06 06/08/2023   BUN 26 (H) 06/08/2023   NA 139 06/08/2023   K 4.6 06/08/2023   CL 105 06/08/2023   CO2 24 06/08/2023    Lab Results  Component Value Date   ALT 16 06/08/2023   AST 16 06/08/2023   ALKPHOS 67 11/20/2022   BILITOT 0.4 06/08/2023      Microbiology: No results found for this or any previous visit (from the past 240 hours).    Serology:   Imaging: If present, new imagings (plain films, ct scans, and mri) have been personally visualized and interpreted; radiology reports have been reviewed. Decision making incorporated into the Impression / Recommendations.   11/20/22 cxr FINDINGS: Stable cardiomediastinal contours. Low lung volumes. No signs of pleural effusion or edema. No airspace opacities identified. Spinal fixation hardware noted within the mid and lower thoracic spine. No acute osseous findings.   IMPRESSION: Low lung volumes. No acute findings.   11/21/22 mri c, t, and lumbar spine IMPRESSION: CERVICAL SPINE:   1. Fluid and enhancement within the C3-C4 disc space with normal adjacent endplates and no paravertebral inflammatory change, favored to be degenerative in etiology. 2. 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 3. Multilevel cervical spondylosis as described above. Moderate or severe  bilateral neuroforaminal stenosis from C2-C3 through C6-C7. Moderate spinal canal stenosis at C4-C5.   THORACIC SPINE:   1. Recurrent T10-T11 osteomyelitis discitis with paravertebral inflammatory change and bilateral paravertebral abscesses measuring 2.7 x 2.1 cm on the left and 2.7 x 1.2 cm on the right. Evaluation for epidural involvement is limited due to hardware artifact obscuring the lower thoracic spinal canal. 2. Similar chronic sequela of osteomyelitis-discitis at T9-T10 without recurrence. 3. New adjacent segment disease at T5-T6 with large left subarticular disc extrusion, worsening left-sided facet arthropathy, and new moderate left neuroforaminal stenosis. 4. Recurrent small bilateral pleural effusions.   LUMBAR SPINE:   1. No evidence of osteomyelitis-discitis. 2. Postsurgical changes from prior thoracolumbosacral fusion.      03/2022 thoracic spine mri 1. Interval superior extension of the thoracolumbar fusion to include T6 through the sacrum. 2. The previously demonstrated changes of diskitis and osteomyelitis at T9-10 and T10-11 have substantially improved, and there is no evidence of residual or recurrent epidural or paraspinal fluid collection. There are some residual marrow changes within the T10 vertebral body, but  no progressive bone destruction. 3. No new osseous abnormalities. 4. Possible subpleural right lower lobe pulmonary nodule. Further evaluation with chest CT recommended.     Jamesetta Mcbride, MD [facility] [phone] pager    12/14/2023, 10:43 AM

## 2023-12-16 LAB — COMPLETE METABOLIC PANEL WITHOUT GFR
AG Ratio: 1.9 (calc) (ref 1.0–2.5)
ALT: 19 U/L (ref 9–46)
AST: 21 U/L (ref 10–35)
Albumin: 4.3 g/dL (ref 3.6–5.1)
Alkaline phosphatase (APISO): 51 U/L (ref 35–144)
BUN: 19 mg/dL (ref 7–25)
CO2: 26 mmol/L (ref 20–32)
Calcium: 9.5 mg/dL (ref 8.6–10.3)
Chloride: 107 mmol/L (ref 98–110)
Creat: 0.92 mg/dL (ref 0.70–1.28)
Globulin: 2.3 g/dL (ref 1.9–3.7)
Glucose, Bld: 110 mg/dL — ABNORMAL HIGH (ref 65–99)
Potassium: 4.3 mmol/L (ref 3.5–5.3)
Sodium: 141 mmol/L (ref 135–146)
Total Bilirubin: 0.4 mg/dL (ref 0.2–1.2)
Total Protein: 6.6 g/dL (ref 6.1–8.1)

## 2023-12-16 LAB — CBC
HCT: 41.2 % (ref 38.5–50.0)
Hemoglobin: 13.5 g/dL (ref 13.2–17.1)
MCH: 29.7 pg (ref 27.0–33.0)
MCHC: 32.8 g/dL (ref 32.0–36.0)
MCV: 90.7 fL (ref 80.0–100.0)
MPV: 10 fL (ref 7.5–12.5)
Platelets: 307 10*3/uL (ref 140–400)
RBC: 4.54 10*6/uL (ref 4.20–5.80)
RDW: 13.2 % (ref 11.0–15.0)
WBC: 8.1 10*3/uL (ref 3.8–10.8)

## 2023-12-16 LAB — C-REACTIVE PROTEIN: CRP: <3 mg/L (ref <8.0)

## 2023-12-16 LAB — BRAIN NATRIURETIC PEPTIDE: Brain Natriuretic Peptide: 61 pg/mL (ref <100)

## 2024-01-06 ENCOUNTER — Ambulatory Visit (HOSPITAL_COMMUNITY)

## 2024-01-24 ENCOUNTER — Other Ambulatory Visit: Payer: Self-pay | Admitting: Internal Medicine

## 2024-02-13 ENCOUNTER — Telehealth: Payer: Self-pay

## 2024-02-13 NOTE — Telephone Encounter (Signed)
 Patient called office today stating he had echocardiogram done this past Friday. [facility] in [place] to have results faxed.  Not able to reach medical records dept for results. Voicemail is full. Lorenda CHRISTELLA Code, RMA

## 2024-02-20 NOTE — Telephone Encounter (Signed)
 Results received and placed in Dr. Overton box

## 2024-02-21 NOTE — Telephone Encounter (Signed)
Emailed them to you

## 2024-02-22 NOTE — Telephone Encounter (Signed)
 Patient informed and verbalized understanding.  Amar Sippel Jonathon Resides, CMA

## 2024-02-29 ENCOUNTER — Encounter: Payer: Self-pay | Admitting: Family Medicine

## 2024-03-19 ENCOUNTER — Encounter: Admitting: Physical Medicine and Rehabilitation

## 2024-05-04 ENCOUNTER — Encounter: Attending: Physical Medicine and Rehabilitation | Admitting: Physical Medicine and Rehabilitation

## 2024-05-04 ENCOUNTER — Encounter: Payer: Self-pay | Admitting: Physical Medicine and Rehabilitation

## 2024-05-04 VITALS — BP 132/81 | HR 55 | Ht 68.0 in | Wt 291.8 lb

## 2024-05-04 DIAGNOSIS — G061 Intraspinal abscess and granuloma: Secondary | ICD-10-CM | POA: Diagnosis not present

## 2024-05-04 DIAGNOSIS — N528 Other male erectile dysfunction: Secondary | ICD-10-CM | POA: Insufficient documentation

## 2024-05-04 DIAGNOSIS — G8222 Paraplegia, incomplete: Secondary | ICD-10-CM | POA: Insufficient documentation

## 2024-05-04 NOTE — Progress Notes (Signed)
 Subjective:    [name], male    DOB: 05/01/1951, 73 y.o.   MRN: [id number]  HPI Pt is a 73 yr old male with hx of epidural abscess with incomplete paraplegia status post T9-T10 laminectomy decompression followed by reconstruction T6-T10 posterior decompression fusion removal of hardware and extension of fusion to the thoracic 11 decompression of spinal cord direct T9-T11 laminectomy arthrodesis T6-T11 03/31/2021; also has post op and chronic pain; muscle spasms; and newly dx'd HTN; as well as OSA, gout; and IBS, with morbid obesity- current BMI 44.40; New dx of prediabetes? Vs DM. On Metformin   F/u on paraplegia.   Last Memorial Day Dr Overton had taken him off PO ABX and then ended up having infection due to back infection /epidural abscess.    Wife feel June 30th and broke her body  Slammed in concrete sidewalk- knocked teeth loose and broke bones in B/L UEs. And was working- pt's wife and was driving wife to work.    Hasn't heard from Urology or follow up on that.    Has been off Duloxetine  for 2 months Very seldom, has pain.   Finds that fingers are numb in AM and shoulders are stiff and maybe gone to sleep.   Gets better in a couple of minutes-  Difficulty grabbing Eyeglasses initially,   ECHO was done- everything was OK.  No cardiac issues Still has a lot of swelling or ankles- couldn't get shoes on this AM- had to wear a different pair- usually goes down at night.   PCP did testing for JVD- was (-)  And ECHo was normal, so no additional work up.   DM- -  has a physical coming up.  Sees D rVu from ID next month - had paravertebral 2.7 cm fluid collection and recurrent discitis-  No back pain-   Has eye surgery scheduled for December-  Optho- said no DM retinopathy-  Has to have surgery on retina on R eye.     Balance and R knee is impaired R knee- sometimes sharp pains in R knee And doesn't want knee to buckle, so using RW or rolator.  Walking  on uneven ground- always using RW/rolator.    Has a regular sitter for his mother now-  Getting more sleep- 6 hours/day.   Used to fall asleep during day with Duloxetine - during day.  Now, Wakes up 7-7:30 and stays up til 1am or so.  That's normal for him.   Not wearing AFO's- not wearing due to swelling    Pain Inventory Average Pain 0 Pain Right Now 0 My pain is numbness  In the last 24 hours, has pain interfered with the following? General activity 1 Relation with others 0 Enjoyment of life 0 What TIME of day is your pain at its worst? morning  Sleep (in general) Good  Pain is worse with: walking and standing Pain improves with: rest Relief from Meds: no meds  Family History  Problem Relation Age of Onset   Hypertension Mother    Arthritis Mother    Stomach cancer Father    Hypertension Brother    Social History   Socioeconomic History   Marital status: Married    Spouse name: Not on file   Number of children: 2   Years of education: Not on file   Highest education level: Not on file  Occupational History   Not on file  Tobacco Use   Smoking status: Never   Smokeless  tobacco: Never  Vaping Use   Vaping status: Never Used  Substance and Sexual Activity   Alcohol use: Not Currently    Comment: rare- social    Drug use: No   Sexual activity: Yes  Other Topics Concern   Not on file  Social History Narrative   Not on file   Social Drivers of Health   Financial Resource Strain: Not on file  Food Insecurity: No Food Insecurity (11/20/2022)   Hunger Vital Sign    Worried About Running Out of Food in the Last Year: Never true    Ran Out of Food in the Last Year: Never true  Transportation Needs: No Transportation Needs (11/20/2022)   PRAPARE - Administrator, Civil Service (Medical): No    Lack of Transportation (Non-Medical): No  Physical Activity: Not on file  Stress: Not on file  Social Connections: Not on file   Past Surgical  History:  Procedure Laterality Date   ABDOMINAL EXPOSURE N/A 04/18/2020   Procedure: ABDOMINAL EXPOSURE;  Surgeon: Gretta Lonni PARAS, MD;  Location[facility] OR;  Service: Vascular;  Laterality: N/A;   ANTERIOR LAT LUMBAR FUSION Left 04/18/2020   Procedure: ANTERIOR LATERAL LUMBAR FUSION LUMBAR TWO-THREE, LUMBAR THREE-FOUR;  Surgeon: Unice Pac, MD;  Location: [facility] OR;  Service: Neurosurgery;  Laterality: Left;   ANTERIOR LUMBAR FUSION N/A 04/18/2020   Procedure: Lumbar Four-Five Lumbar Five- Sacral One Anterior lumbar Interbody Fusion;  Surgeon: Unice Pac, MD;  Location: [facility] OR;  Service: Neurosurgery;  Laterality: N/A;   APPLICATION OF INTRAOPERATIVE CT SCAN N/A 04/21/2020   Procedure: APPLICATION OF INTRAOPERATIVE CT SCAN;  Surgeon: Unice Pac, MD;  Location: [facility] OR;  Service: Neurosurgery;  Laterality: N/A;   BACK SURGERY     lumbar disc surgery   COLONOSCOPY W/ POLYPECTOMY     HERNIA REPAIR Left    left inguinal hernia repair   IR RADIOLOGIST EVAL & MGMT  02/24/2021   KNEE ARTHROSCOPY Right 2003    -scope for meniscus   LUMBAR WOUND DEBRIDEMENT N/A 01/13/2021   Procedure: THORACOLUMBAR WOUND DEBRIDEMENT;  Surgeon: Unice Pac, MD;  Location: [facility] OR;  Service: Neurosurgery;  Laterality: N/A;   LUMBAR WOUND DEBRIDEMENT N/A 03/14/2021   Procedure: WOUND EXPLORATION AND DEBRIDEMENT OF ABSCESS;  Surgeon: Lanis Pupa, MD;  Location: [facility] OR;  Service: Neurosurgery;  Laterality: N/A;   POSTERIOR LUMBAR FUSION 4 LEVEL N/A 04/21/2020   Procedure: Thoracic ten to pelvis pedicle screw fixation with osteotomies;  Surgeon: Unice Pac, MD;  Location: [facility] OR;  Service: Neurosurgery;  Laterality: N/A;   POSTERIOR LUMBAR FUSION 4 LEVEL N/A 03/31/2021   Procedure: Thoracic six to Thoracic nine  Posterior decompression/Fusion with removal hardware thoracic ten;  Surgeon: Unice Pac, MD;  Location: [facility] OR;  Service: Neurosurgery;  Laterality: N/A;   THORACIC LAMINECTOMY FOR EPIDURAL ABSCESS N/A  03/14/2021   Procedure: THORACIC LAMINECTOMY, THORACIC NINE-THORACIC TEN;  Surgeon: Lanis Pupa, MD;  Location: [facility] OR;  Service: Neurosurgery;  Laterality: N/A;   TOTAL HIP ARTHROPLASTY Left 06/15/2016   Procedure: LEFT TOTAL HIP ARTHROPLASTY ANTERIOR APPROACH;  Surgeon: Donnice Car, MD;  Location: [facility] ORS;  Service: Orthopedics;  Laterality: Left;   Past Surgical History:  Procedure Laterality Date   ABDOMINAL EXPOSURE N/A 04/18/2020   Procedure: ABDOMINAL EXPOSURE;  Surgeon: Gretta Lonni PARAS, MD;  Location: [facility] OR;  Service: Vascular;  Laterality: N/A;   ANTERIOR LAT LUMBAR FUSION Left 04/18/2020   Procedure: ANTERIOR LATERAL LUMBAR FUSION LUMBAR TWO-THREE, LUMBAR THREE-FOUR;  Surgeon: Unice Pac, MD;  Location: [facility] OR;  Service: Neurosurgery;  Laterality: Left;   ANTERIOR LUMBAR FUSION N/A 04/18/2020   Procedure: Lumbar Four-Five Lumbar Five- Sacral One Anterior lumbar Interbody Fusion;  Surgeon: Unice Pac, MD;  Location: [facility] OR;  Service: Neurosurgery;  Laterality: N/A;   APPLICATION OF INTRAOPERATIVE CT SCAN N/A 04/21/2020   Procedure: APPLICATION OF INTRAOPERATIVE CT SCAN;  Surgeon: Unice Pac, MD;  Location: [facility] OR;  Service: Neurosurgery;  Laterality: N/A;   BACK SURGERY     lumbar disc surgery   COLONOSCOPY W/ POLYPECTOMY     HERNIA REPAIR Left    left inguinal hernia repair   IR RADIOLOGIST EVAL & MGMT  02/24/2021   KNEE ARTHROSCOPY Right 2003    -scope for meniscus   LUMBAR WOUND DEBRIDEMENT N/A 01/13/2021   Procedure: THORACOLUMBAR WOUND DEBRIDEMENT;  Surgeon: Unice Pac, MD;  Location: [facility] OR;  Service: Neurosurgery;  Laterality: N/A;   LUMBAR WOUND DEBRIDEMENT N/A 03/14/2021   Procedure: WOUND EXPLORATION AND DEBRIDEMENT OF ABSCESS;  Surgeon: Lanis Pupa, MD;  Location: [facility] OR;  Service: Neurosurgery;  Laterality: N/A;   POSTERIOR LUMBAR FUSION 4 LEVEL N/A 04/21/2020   Procedure: Thoracic ten to pelvis pedicle screw fixation with osteotomies;  Surgeon:  Unice Pac, MD;  Location: [facility] OR;  Service: Neurosurgery;  Laterality: N/A;   POSTERIOR LUMBAR FUSION 4 LEVEL N/A 03/31/2021   Procedure: Thoracic six to Thoracic nine  Posterior decompression/Fusion with removal hardware thoracic ten;  Surgeon: Unice Pac, MD;  Location: [facility] OR;  Service: Neurosurgery;  Laterality: N/A;   THORACIC LAMINECTOMY FOR EPIDURAL ABSCESS N/A 03/14/2021   Procedure: THORACIC LAMINECTOMY, THORACIC NINE-THORACIC TEN;  Surgeon: Lanis Pupa, MD;  Location: [facility] OR;  Service: Neurosurgery;  Laterality: N/A;   TOTAL HIP ARTHROPLASTY Left 06/15/2016   Procedure: LEFT TOTAL HIP ARTHROPLASTY ANTERIOR APPROACH;  Surgeon: Donnice Car, MD;  Location: [facility] ORS;  Service: Orthopedics;  Laterality: Left;   Past Medical History:  Diagnosis Date   Arthritis    arthritis-bilateral knees, left Hip.   Degenerative lumbar spinal stenosis    EKG abnormality    06-08-16- being evaluated by cardiologist  in [place]   Idiopathic scoliosis of lumbar region    Joint stiffness    Knee pain    Leg swelling    Low back pain    Achy, shooting pain.  Hurts to Walk or stand up straight.   Lumbar radiculopathy    Scoliosis concern    Sleep apnea    Bipap use nightly 25/19 -3l/m oxygen   Spondylolisthesis of lumbar region    Transfusion history    [place] ,[place] after bleeding post colon polyp removal and colonscopy procedure   BP (!) 151/76   Pulse (!) 55   Ht 5' 8 (1.727 m)   Wt 291 lb 12.8 oz (132.4 kg)   SpO2 94%   BMI 44.37 kg/m   Opioid Risk Score:   Fall Risk Score:  `1  Depression screen PHQ 2/9     05/04/2024    1:00 PM 07/18/2023   12:57 PM 04/18/2023    1:03 PM 03/03/2023   10:19 AM 01/17/2023   10:40 AM 01/14/2023    9:34 AM 11/05/2022   10:43 AM  Depression screen PHQ 2/9  Decreased Interest 0 0 0 0 0 0 0  Down, Depressed, Hopeless 0 0 0 0 0 0 0  PHQ - 2 Score 0 0 0 0 0 0 0     Review of Systems  Musculoskeletal:  Positive for back pain and gait  problem.       Bil wrist and shoulders and right knee  All other systems reviewed and are negative.      Objective:   Physical Exam Awake, alert, appropriate, using RW- took 4x to get to standing position when tried to stand up, NAD  3-4 + LE edema to knees B/L  MSK:  RLE- HF 4-/5; KE/KF 4-/5; DF 3+/5 and PF 4+/5 LLE- HF 2/5; KE KE/KF 4+/5; DF 2/5 and PF 4/5 Pt took 4-5x to get up off table to stand- sit to stand- due to low surface and weakness.      Assessment & Plan:   Pt is a 73 yr old male with hx of epidural abscess with incomplete paraplegia status post T9-T10 laminectomy decompression followed by reconstruction T6-T10 posterior decompression fusion removal of hardware and extension of fusion to the thoracic 11 decompression of spinal cord direct T9-T11 laminectomy arthrodesis T6-T11 03/31/2021; also has post op and chronic pain; muscle spasms; and newly dx'd HTN; as well as OSA, gout; and IBS, with morbid obesity- current BMI 44.40; New dx of prediabetes? Vs DM. On Metformin   May 2024-  Dr Overton had taken him off PO ABX and then ended up having infection due to back infection /epidural abscess.   F/u on paraplegia.   Off Duloxetine - completley- hasn't needed it in 2 months- so will stay off.   2. Off Viagra - didn't work- for erectile dysfunction-  - Diabetes can also cause a big role in in erectile dysfunction and going to Urology wouldn't  be helpful-if it's your Spinal cord injury more than anything, a referral to Urology would be helpful.   3.  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    4.  Outpt PT- for LE weakness- that's worse today than has been- - much  worse than normal- RLE used to be 5-/5; and LLE- also weaker than was last visit.  Handwritten Rx given.  Pt is weaker than at last visit.   5.  F/U- 4 months- double appt- SCI   I spent a total of  35  minutes on total care  today- >50% coordination of care- due to d/w pt about erectile dysfunction, DM and how that can play a part- also pain and therapies needed

## 2024-05-04 NOTE — Patient Instructions (Signed)
 Pt is a 73 yr old male with hx of epidural abscess with incomplete paraplegia status post T9-T10 laminectomy decompression followed by reconstruction T6-T10 posterior decompression fusion removal of hardware and extension of fusion to the thoracic 11 decompression of spinal cord direct T9-T11 laminectomy arthrodesis T6-T11 03/31/2021; also has post op and chronic pain; muscle spasms; and newly dx'd HTN; as well as OSA, gout; and IBS, with morbid obesity- current BMI 44.40; New dx of prediabetes? Vs DM. On Metformin   May 2024-  Dr Overton had taken him off PO ABX and then ended up having infection due to back infection /epidural abscess.   F/u on paraplegia.   Off Duloxetine - completley- hasn't needed it in 2 months- so will stay off.   2. Off Viagra - didn't work- for erectile dysfunction-  - Diabetes can also cause a big role in in erectile dysfunction and going to Urology wouldn't  be helpful-if it's your Spinal cord injury more than anything, a referral to Urology would be helpful.   3.  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    4.  Outpt PT- for LE weakness- that's worse today than has been- - much  worse than normal- RLE used to be 5-/5; and LLE- also weaker than was last visit.  Handwritten Rx given.  Pt is weaker than at last visit.   5.  F/U- 4 months- double appt- SCI

## 2024-05-08 ENCOUNTER — Encounter: Payer: Self-pay | Admitting: Physical Medicine and Rehabilitation

## 2024-06-05 ENCOUNTER — Ambulatory Visit: Admitting: Internal Medicine

## 2024-06-29 ENCOUNTER — Other Ambulatory Visit: Payer: Self-pay | Admitting: Internal Medicine

## 2024-07-04 ENCOUNTER — Ambulatory Visit: Admitting: Internal Medicine

## 2024-07-24 ENCOUNTER — Ambulatory Visit: Admitting: Internal Medicine

## 2024-08-02 ENCOUNTER — Ambulatory Visit: Admitting: Internal Medicine

## 2024-08-02 ENCOUNTER — Other Ambulatory Visit: Payer: Self-pay

## 2024-08-02 ENCOUNTER — Encounter: Payer: Self-pay | Admitting: Internal Medicine

## 2024-08-02 VITALS — BP 153/78 | HR 54 | Temp 98.6°F | Wt 292.0 lb

## 2024-08-02 DIAGNOSIS — R609 Edema, unspecified: Secondary | ICD-10-CM

## 2024-08-02 DIAGNOSIS — B9689 Other specified bacterial agents as the cause of diseases classified elsewhere: Secondary | ICD-10-CM

## 2024-08-02 DIAGNOSIS — T847XXD Infection and inflammatory reaction due to other internal orthopedic prosthetic devices, implants and grafts, subsequent encounter: Secondary | ICD-10-CM

## 2024-08-02 DIAGNOSIS — G061 Intraspinal abscess and granuloma: Secondary | ICD-10-CM

## 2024-08-02 DIAGNOSIS — M4620 Osteomyelitis of vertebra, site unspecified: Secondary | ICD-10-CM

## 2024-08-02 MED ORDER — CEFADROXIL 500 MG PO CAPS: 500.0000 mg | ORAL_CAPSULE | Freq: Two times a day (BID) | ORAL | 11 refills | Status: AC

## 2024-08-02 NOTE — Patient Instructions (Signed)
 Please do labs today  I have refilled your cefadroxil    Please see me in follow up in 05/2025     When you see your lung doctor, please ask if your legs swelling could be a sign of pulmonary hypertension due to chronic sleep apnea and if further workup for that needed

## 2024-08-02 NOTE — Progress Notes (Signed)
 "        [facility]    Abx: 5/25-01/01/23 cefazolin  Cefadroxil  up until 11/05/22; 01/01/23-c (500 mg bid since 05/2023)   ASSESSMENT: 74 yo male well known to me, hx t10 to sacral spinal fusion 03/2020, complicated by late (12/2020) T7-T11 epidural abscess/vertebral OM requiring I&D 01/13/2021 (cx ecoli; blood cx negative), repeated admissions for complications including bilateral empyema/retroperitoneal abscess s/p drainage (fluid cx negative) presumed spill over from vertebral process (blood cx negative; tte 01/21/21 no evidence valve vegetation), right knee effusion with negative fluid cx 01/28/2021, another I&D 03/14/2021 & 03/31/2021 and more hardware stabilization for worsening epidural abscess at T9-11 level (operative cx and bcx negative), who done well with 20 months of antibiotics suppression, but unfortunately relapsed after 10 days of trial off abx starting 11/04/2021 (fever, e coli bacteremia, 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 He has been walking with front wheel walker since recovered from this process  Culture: 11/19/22 OSH blood cx ecoli (S cefazolin , cipro, bactrim)  11/20/22 blood cx negative 03/14/2021, 03/31/2021 surgical (spinal) fluid swab cx negative 03/14/2021 bcx negative 01/28/21 right knee fluid cx negative 01/26/21 bcx negative 01/20/21, 01/23/21 bcx negative 01/20/21 right and left pleural fluid cx negative 01/13/21 thoracolumbar operative fluid cx ecoli (S cefazolin , cipro, bactrim) 01/10/21 bcx negative   He had sepsis and worsening back pain about 10 days after stopping cefadroxil . Seen initially in [place], [place]  ER and blood cx there was positive for ecoli  Of note, the CLSI had updated cefazolin  mic to be </=2 but the cards from both [place] and [place], [place] has MIC</=4 (can't go below that) and sensitivity is presumed for  cefazolin   5/26 mri of the entire spine does show new paravertebral 2.7 fluid collection and recurrent t9-11 discitis/om. His repeat 11/20/22 blood cx here so far is negative  Neurosurgery had evaluated and given no new lower extremities neurological deficit or hardware failure, no plan to do any surgery at this time.   For now, agree given relative lack of concerning symptomatology and presence of small fluid collection, would do antibiotics and monitor   As he had failed a trial off abx rather quickly, will commit him to life-long suppression antibiotics treatment   -------------- 12/09/22 id clinic assessment Patient improved pain since back on antibiotics, since hospital discharge Mid back pain worse in morning waking up and getting back to sleep at night, but manageable during day. Tylenol  once in a while.  Opat labs pending not in lab-[facility] website    6 weeks cefazolin  until 01/01/23 (or before that weekend)  On the week when he stopped cefazolin , will restart 1000 mg cefadroxil  bid    See me mid-end July for follow up    01/14/23 id clinic assessment Patient finished 6 weeks cefazolin  on 01/01/23 and transitioned back to cefadroxil  1 gram twice a day Since the infection recurs, his back pain seems better but not yet back to baseline as when he was on prolonged duration of antibiotics before we stopped No fever, chill, n/v/diarrhea Opat labs crp had significantly improved but still elevated  Opat labs crp/esr 7/19  13.5 (<8) 6/25   2  (0.3) /  56 6/18   26 (3) 6/11   48 (3)   Continue cefadroxil  1000mg  bid. In 3-6 months if doing well will decrease dose Ct thoracic spine with contrast f/u 3-4 weeks   ---------- 03/03/23 id clinic assessment Doing better (  mild chronic lower back pain) and recurrent pain on the mid back right side since after stopping cefadroxil  briefly now uch improved No n/v/diarrhea No fever, chill  8/26 ct done thoracic spine but no read yet. But  suspect improvement in setting of sx reported  Labs today  F/u 3 months  Continue cefadroxil      06/08/23 id clinic assessment Doing well at baseline. Once in a while intermittent lower back left paralumbar Stable lower strength and walking with FWW; he walks inside his home some with cane/fww No abx side effect Crp had trended down and normalized by early 02/2023   Will decrease cefadroxil  dose to 500 mg twice a day Labs today F/u 6 months No further plan to take him off abx    08/02/24 #hardware/back om On cefadroxil  maintenance dose 500 mg bid since 05/2023 No side effect Labs today Recurrence vertebral osteomyelitis/paraspinal abscess off antibiotics so will keep on indefinitely F/u 05/2025       #worsening lower ext edema in setting sleep apnea. Echo 2022 unable to measure/estimate pulm pressure as no TR jet. Difficult to gauge activity tolerance as getting around with fww. No sign of left heart failure. Risk for pulm htn and will check bnp/tte Advise f/u pcp for ongoing w/u Consider compression wrap of the legs - defer diuresis temporary use to pcp   08/02/24 stable edema. No new respiratory dyspnea.  -discuss with pulm on f/u for OSA if any concern for pulmonary hypertension related edema -continue compression stocking      ------------------------ HPI: 74 yo male well known to me, hx t10 to sacral spinal fusion 03/2020, complicated by late (12/2020) T7-T11 epidural abscess/vertebral OM requiring I&D 01/13/2021 (cx ecoli; blood cx negative), repeated admissions for complications including bilateral empyema/retroperitoneal abscess s/p drainage (fluid cx negative) presumed spill over from vertebral process (blood cx negative; tte 01/21/21 no evidence valve vegetation), right knee effusion with negative fluid cx 01/28/2021, another I&D 03/14/2021 & 03/31/2021 and more hardware stabilization for worsening epidural abscess at T9-11 level (operative cx and bcx negative),  who done well with 20 months of antibiotics suppression, but unfortunately relapsed after 10 days of trial off abx starting 11/04/2021 (fever, e coli bacteremia, 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 He has been walking with front wheel walker since recovered from this process. He is here today for hospital follow up   Culture: 11/19/22 OSH blood cx ecoli (S cefazolin , cipro, bactrim)  11/20/22 blood cx negative 03/14/2021, 03/31/2021 surgical (spinal) fluid swab cx negative 03/14/2021 bcx negative 01/28/21 right knee fluid cx negative 01/26/21 bcx negative 01/20/21, 01/23/21 bcx negative 01/20/21 right and left pleural fluid cx negative 01/13/21 thoracolumbar operative fluid cx ecoli (S cefazolin , cipro, bactrim) 01/10/21 bcx negative   He had sepsis and worsening back pain about 10 days after stopping cefadroxil . Seen initially in [place], [place]  ER and blood cx there was positive for ecoli  Of note, the CLSI had updated cefazolin  mic to be </=2 but the cards from both [place] and [place], [place] has MIC</=4 (can't go below that) and sensitivity is presumed for cefazolin   5/26 mri of the entire spine does show new paravertebral 2.7 fluid collection and recurrent t9-11 discitis/om. His repeat 11/20/22 blood cx here so far is negative  Neurosurgery had evaluated and given no new lower extremities neurological deficit or hardware failure, no plan to do any surgery at this time.   For now, agree given relative  lack of concerning symptomatology and presence of small fluid collection, would do antibiotics and monitor   As he had failed a trial off abx rather quickly, will commit him to life-long suppression antibiotics treatment   --------------- 08/02/24 id clinic f/u To see pulm in f/u for osa Edema in legs stable No dyspnea Tolerating cefadroxil   See a/p above for detail of today's  visit     Review of Systems: ROS All other ROS was negative, except mentioned above     OBJECTIVE: Vitals:   08/02/24 1340 08/02/24 1341  BP:  (!) 153/78  Pulse:  (!) 54  Temp:  98.6 F (37 C)  TempSrc:  Oral  SpO2:  96%  Weight: 292 lb (132.5 kg)    Body mass index is 44.4 kg/m.  Physical Exam General/constitutional: no distress, pleasant, walks with fww HEENT: Normocephalic, PER, Conj Clear, EOMI, Oropharynx clear Neck supple CV: rrr no mrg; jvp not elevated upright position Lungs: clear to auscultation, normal respiratory effort Abd: Soft, Nontender Ext: 2+ edema to thigh Skin: No Rash Neuro: nonfocal MSK: nontender back   Lab Results Lab Results  Component Value Date   WBC 8.1 12/14/2023   HGB 13.5 12/14/2023   HCT 41.2 12/14/2023   MCV 90.7 12/14/2023   PLT 307 12/14/2023    Lab Results  Component Value Date   CREATININE 0.92 12/14/2023   BUN 19 12/14/2023   NA 141 12/14/2023   K 4.3 12/14/2023   CL 107 12/14/2023   CO2 26 12/14/2023    Lab Results  Component Value Date   ALT 19 12/14/2023   AST 21 12/14/2023   ALKPHOS 67 11/20/2022   BILITOT 0.4 12/14/2023      Microbiology: No results found for this or any previous visit (from the past 240 hours).    Serology:   Imaging: If present, new imagings (plain films, ct scans, and mri) have been personally visualized and interpreted; radiology reports have been reviewed. Decision making incorporated into the Impression / Recommendations.   11/20/22 cxr FINDINGS: Stable cardiomediastinal contours. Low lung volumes. No signs of pleural effusion or edema. No airspace opacities identified. Spinal fixation hardware noted within the mid and lower thoracic spine. No acute osseous findings.   IMPRESSION: Low lung volumes. No acute findings.   11/21/22 mri c, t, and lumbar spine IMPRESSION: CERVICAL SPINE:   1. Fluid and enhancement within the C3-C4 disc space with normal adjacent  endplates and no paravertebral inflammatory change, favored to be degenerative in etiology. 2. 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 3. Multilevel cervical spondylosis as described above. Moderate or severe bilateral neuroforaminal stenosis from C2-C3 through C6-C7. Moderate spinal canal stenosis at C4-C5.   THORACIC SPINE:   1. Recurrent T10-T11 osteomyelitis discitis with paravertebral inflammatory change and bilateral paravertebral abscesses measuring 2.7 x 2.1 cm on the left and 2.7 x 1.2 cm on the right. Evaluation for epidural involvement is limited due to hardware artifact obscuring the lower thoracic spinal canal. 2. Similar chronic sequela of osteomyelitis-discitis at T9-T10 without recurrence. 3. New adjacent segment disease at T5-T6 with large left subarticular disc extrusion, worsening left-sided facet arthropathy, and new moderate left neuroforaminal stenosis. 4. Recurrent small bilateral pleural effusions.   LUMBAR SPINE:   1. No evidence of osteomyelitis-discitis. 2. Postsurgical changes from prior thoracolumbosacral fusion.      03/2022 thoracic spine mri 1. Interval superior extension of the thoracolumbar fusion to include T6 through the sacrum.  2. The previously demonstrated changes of diskitis and osteomyelitis at T9-10 and T10-11 have substantially improved, and there is no evidence of residual or recurrent epidural or paraspinal fluid collection. There are some residual marrow changes within the T10 vertebral body, but no progressive bone destruction. 3. No new osseous abnormalities. 4. Possible subpleural right lower lobe pulmonary nodule. Further evaluation with chest CT recommended.     Constance ONEIDA Passer, MD [facility] [phone] pager    08/02/2024, 1:50 PM  "

## 2024-08-03 LAB — CBC
HCT: 42.9 % (ref 39.4–51.1)
Hemoglobin: 14.6 g/dL (ref 13.2–17.1)
MCH: 30.2 pg (ref 27.0–33.0)
MCHC: 34 g/dL (ref 31.6–35.4)
MCV: 88.6 fL (ref 81.4–101.7)
MPV: 10.4 fL (ref 7.5–12.5)
Platelets: 308 10*3/uL (ref 140–400)
RBC: 4.84 Million/uL (ref 4.20–5.80)
RDW: 12.5 % (ref 11.0–15.0)
WBC: 8.8 10*3/uL (ref 3.8–10.8)

## 2024-08-03 LAB — COMPLETE METABOLIC PANEL WITHOUT GFR
AG Ratio: 1.8 (calc) (ref 1.0–2.5)
ALT: 28 U/L (ref 9–46)
AST: 25 U/L (ref 10–35)
Albumin: 4.4 g/dL (ref 3.6–5.1)
Alkaline phosphatase (APISO): 53 U/L (ref 35–144)
BUN: 24 mg/dL (ref 7–25)
CO2: 29 mmol/L (ref 20–32)
Calcium: 9.2 mg/dL (ref 8.6–10.3)
Chloride: 103 mmol/L (ref 98–110)
Creat: 1.06 mg/dL (ref 0.70–1.28)
Globulin: 2.4 g/dL (ref 1.9–3.7)
Glucose, Bld: 155 mg/dL — ABNORMAL HIGH (ref 65–99)
Potassium: 4.3 mmol/L (ref 3.5–5.3)
Sodium: 139 mmol/L (ref 135–146)
Total Bilirubin: 0.4 mg/dL (ref 0.2–1.2)
Total Protein: 6.8 g/dL (ref 6.1–8.1)

## 2024-08-31 ENCOUNTER — Encounter: Admitting: Physical Medicine and Rehabilitation

## 2025-05-28 ENCOUNTER — Ambulatory Visit: Payer: Self-pay | Admitting: Internal Medicine
